# Patient Record
Sex: Male | Born: 1945 | Race: White | Hispanic: No | Marital: Married | Smoking: Former smoker
Health system: Southern US, Community
[De-identification: ages and names within clinical notes are randomized; demographics above are authoritative.]

## PROBLEM LIST (undated history)

## (undated) DIAGNOSIS — R0600 Dyspnea, unspecified: Secondary | ICD-10-CM

## (undated) DIAGNOSIS — M1990 Unspecified osteoarthritis, unspecified site: Secondary | ICD-10-CM

## (undated) DIAGNOSIS — E785 Hyperlipidemia, unspecified: Secondary | ICD-10-CM

## (undated) DIAGNOSIS — R188 Other ascites: Secondary | ICD-10-CM

## (undated) DIAGNOSIS — D696 Thrombocytopenia, unspecified: Secondary | ICD-10-CM

## (undated) DIAGNOSIS — K219 Gastro-esophageal reflux disease without esophagitis: Secondary | ICD-10-CM

## (undated) DIAGNOSIS — E119 Type 2 diabetes mellitus without complications: Secondary | ICD-10-CM

## (undated) DIAGNOSIS — I350 Nonrheumatic aortic (valve) stenosis: Secondary | ICD-10-CM

## (undated) DIAGNOSIS — I5032 Chronic diastolic (congestive) heart failure: Secondary | ICD-10-CM

## (undated) DIAGNOSIS — Z8719 Personal history of other diseases of the digestive system: Secondary | ICD-10-CM

## (undated) DIAGNOSIS — I10 Essential (primary) hypertension: Secondary | ICD-10-CM

## (undated) DIAGNOSIS — F329 Major depressive disorder, single episode, unspecified: Secondary | ICD-10-CM

## (undated) DIAGNOSIS — R0989 Other specified symptoms and signs involving the circulatory and respiratory systems: Secondary | ICD-10-CM

## (undated) DIAGNOSIS — M109 Gout, unspecified: Secondary | ICD-10-CM

## (undated) DIAGNOSIS — R590 Localized enlarged lymph nodes: Secondary | ICD-10-CM

## (undated) DIAGNOSIS — I2510 Atherosclerotic heart disease of native coronary artery without angina pectoris: Secondary | ICD-10-CM

## (undated) DIAGNOSIS — Z950 Presence of cardiac pacemaker: Secondary | ICD-10-CM

## (undated) DIAGNOSIS — Q2730 Arteriovenous malformation, site unspecified: Secondary | ICD-10-CM

## (undated) DIAGNOSIS — K7460 Unspecified cirrhosis of liver: Secondary | ICD-10-CM

## (undated) DIAGNOSIS — K5520 Angiodysplasia of colon without hemorrhage: Secondary | ICD-10-CM

## (undated) DIAGNOSIS — I509 Heart failure, unspecified: Secondary | ICD-10-CM

## (undated) DIAGNOSIS — D509 Iron deficiency anemia, unspecified: Secondary | ICD-10-CM

## (undated) DIAGNOSIS — G4700 Insomnia, unspecified: Secondary | ICD-10-CM

## (undated) DIAGNOSIS — R519 Headache, unspecified: Secondary | ICD-10-CM

## (undated) DIAGNOSIS — E663 Overweight: Secondary | ICD-10-CM

## (undated) DIAGNOSIS — F32A Depression, unspecified: Secondary | ICD-10-CM

## (undated) DIAGNOSIS — I4891 Unspecified atrial fibrillation: Secondary | ICD-10-CM

## (undated) DIAGNOSIS — N189 Chronic kidney disease, unspecified: Secondary | ICD-10-CM

## (undated) DIAGNOSIS — R51 Headache: Secondary | ICD-10-CM

## (undated) DIAGNOSIS — G4733 Obstructive sleep apnea (adult) (pediatric): Secondary | ICD-10-CM

## (undated) DIAGNOSIS — J449 Chronic obstructive pulmonary disease, unspecified: Secondary | ICD-10-CM

## (undated) HISTORY — PX: RIGHT HEART CATH: CATH118263

## (undated) HISTORY — DX: Thrombocytopenia, unspecified: D69.6

## (undated) HISTORY — DX: Unspecified osteoarthritis, unspecified site: M19.90

## (undated) HISTORY — DX: Iron deficiency anemia, unspecified: D50.9

## (undated) HISTORY — DX: Unspecified cirrhosis of liver: K74.60

## (undated) HISTORY — DX: Arteriovenous malformation, site unspecified: Q27.30

## (undated) HISTORY — PX: CATARACT EXTRACTION W/ INTRAOCULAR LENS IMPLANT: SHX1309

## (undated) HISTORY — DX: Obstructive sleep apnea (adult) (pediatric): G47.33

## (undated) HISTORY — DX: Localized enlarged lymph nodes: R59.0

## (undated) HISTORY — DX: Unspecified atrial fibrillation: I48.91

## (undated) HISTORY — DX: Chronic diastolic (congestive) heart failure: I50.32

## (undated) HISTORY — DX: Overweight: E66.3

## (undated) HISTORY — DX: Type 2 diabetes mellitus without complications: E11.9

## (undated) HISTORY — DX: Other specified symptoms and signs involving the circulatory and respiratory systems: R09.89

## (undated) HISTORY — DX: Hyperlipidemia, unspecified: E78.5

## (undated) HISTORY — PX: HERNIA REPAIR: SHX51

## (undated) HISTORY — DX: Insomnia, unspecified: G47.00

## (undated) HISTORY — DX: Atherosclerotic heart disease of native coronary artery without angina pectoris: I25.10

---

## 1997-07-25 DIAGNOSIS — G4733 Obstructive sleep apnea (adult) (pediatric): Secondary | ICD-10-CM

## 1997-07-25 HISTORY — DX: Obstructive sleep apnea (adult) (pediatric): G47.33

## 2000-05-21 ENCOUNTER — Emergency Department (HOSPITAL_COMMUNITY): Admission: EM | Admit: 2000-05-21 | Discharge: 2000-05-21 | Payer: Self-pay | Admitting: Emergency Medicine

## 2001-08-17 ENCOUNTER — Emergency Department (HOSPITAL_COMMUNITY): Admission: EM | Admit: 2001-08-17 | Discharge: 2001-08-17 | Payer: Self-pay | Admitting: Emergency Medicine

## 2001-08-18 ENCOUNTER — Encounter: Payer: Self-pay | Admitting: Emergency Medicine

## 2001-08-28 ENCOUNTER — Ambulatory Visit (HOSPITAL_COMMUNITY): Admission: RE | Admit: 2001-08-28 | Discharge: 2001-08-28 | Payer: Self-pay | Admitting: Family Medicine

## 2002-05-14 ENCOUNTER — Emergency Department (HOSPITAL_COMMUNITY): Admission: EM | Admit: 2002-05-14 | Discharge: 2002-05-15 | Payer: Self-pay | Admitting: Emergency Medicine

## 2002-05-15 ENCOUNTER — Encounter: Payer: Self-pay | Admitting: Emergency Medicine

## 2002-06-16 ENCOUNTER — Inpatient Hospital Stay (HOSPITAL_COMMUNITY): Admission: EM | Admit: 2002-06-16 | Discharge: 2002-06-21 | Payer: Self-pay

## 2002-06-18 ENCOUNTER — Encounter: Payer: Self-pay | Admitting: Cardiology

## 2002-06-20 ENCOUNTER — Encounter: Payer: Self-pay | Admitting: Gastroenterology

## 2002-06-24 ENCOUNTER — Ambulatory Visit (HOSPITAL_COMMUNITY): Admission: RE | Admit: 2002-06-24 | Discharge: 2002-06-24 | Payer: Self-pay | Admitting: Cardiology

## 2002-09-11 ENCOUNTER — Ambulatory Visit (HOSPITAL_COMMUNITY): Admission: RE | Admit: 2002-09-11 | Discharge: 2002-09-11 | Payer: Self-pay | Admitting: Gastroenterology

## 2002-09-23 ENCOUNTER — Ambulatory Visit: Admission: RE | Admit: 2002-09-23 | Discharge: 2002-09-23 | Payer: Self-pay | Admitting: Internal Medicine

## 2002-10-14 ENCOUNTER — Encounter: Payer: Self-pay | Admitting: Cardiology

## 2002-10-15 ENCOUNTER — Encounter: Payer: Self-pay | Admitting: Thoracic Surgery (Cardiothoracic Vascular Surgery)

## 2002-10-15 ENCOUNTER — Inpatient Hospital Stay (HOSPITAL_COMMUNITY): Admission: AD | Admit: 2002-10-15 | Discharge: 2002-10-23 | Payer: Self-pay | Admitting: Cardiology

## 2002-10-15 DIAGNOSIS — Z951 Presence of aortocoronary bypass graft: Secondary | ICD-10-CM

## 2002-10-15 HISTORY — PX: CORONARY ARTERY BYPASS GRAFT: SHX141

## 2002-10-16 ENCOUNTER — Encounter: Payer: Self-pay | Admitting: Thoracic Surgery (Cardiothoracic Vascular Surgery)

## 2002-10-17 ENCOUNTER — Encounter: Payer: Self-pay | Admitting: Thoracic Surgery (Cardiothoracic Vascular Surgery)

## 2002-11-26 ENCOUNTER — Encounter: Payer: Self-pay | Admitting: Thoracic Surgery (Cardiothoracic Vascular Surgery)

## 2002-11-26 ENCOUNTER — Encounter
Admission: RE | Admit: 2002-11-26 | Discharge: 2002-11-26 | Payer: Self-pay | Admitting: Thoracic Surgery (Cardiothoracic Vascular Surgery)

## 2002-12-19 ENCOUNTER — Encounter: Payer: Self-pay | Admitting: Thoracic Surgery (Cardiothoracic Vascular Surgery)

## 2002-12-19 ENCOUNTER — Encounter
Admission: RE | Admit: 2002-12-19 | Discharge: 2002-12-19 | Payer: Self-pay | Admitting: Thoracic Surgery (Cardiothoracic Vascular Surgery)

## 2003-01-02 ENCOUNTER — Encounter: Payer: Self-pay | Admitting: Thoracic Surgery (Cardiothoracic Vascular Surgery)

## 2003-01-06 ENCOUNTER — Ambulatory Visit (HOSPITAL_COMMUNITY)
Admission: RE | Admit: 2003-01-06 | Discharge: 2003-01-06 | Payer: Self-pay | Admitting: Thoracic Surgery (Cardiothoracic Vascular Surgery)

## 2003-01-06 ENCOUNTER — Encounter (INDEPENDENT_AMBULATORY_CARE_PROVIDER_SITE_OTHER): Payer: Self-pay | Admitting: *Deleted

## 2003-10-08 ENCOUNTER — Ambulatory Visit (HOSPITAL_BASED_OUTPATIENT_CLINIC_OR_DEPARTMENT_OTHER): Admission: RE | Admit: 2003-10-08 | Discharge: 2003-10-08 | Payer: Self-pay | Admitting: Orthopedic Surgery

## 2003-10-08 HISTORY — PX: CARPAL TUNNEL RELEASE: SHX101

## 2003-12-19 ENCOUNTER — Encounter: Admission: RE | Admit: 2003-12-19 | Discharge: 2003-12-19 | Payer: Self-pay | Admitting: Family Medicine

## 2004-05-12 ENCOUNTER — Encounter: Admission: RE | Admit: 2004-05-12 | Discharge: 2004-05-12 | Payer: Self-pay | Admitting: Family Medicine

## 2004-06-07 ENCOUNTER — Ambulatory Visit: Payer: Self-pay | Admitting: Cardiology

## 2004-06-25 ENCOUNTER — Ambulatory Visit: Payer: Self-pay | Admitting: Cardiology

## 2004-07-05 ENCOUNTER — Ambulatory Visit: Payer: Self-pay | Admitting: Cardiology

## 2004-08-02 ENCOUNTER — Ambulatory Visit: Payer: Self-pay | Admitting: Cardiology

## 2004-08-31 ENCOUNTER — Ambulatory Visit: Payer: Self-pay | Admitting: Cardiology

## 2004-09-20 ENCOUNTER — Ambulatory Visit: Payer: Self-pay | Admitting: Internal Medicine

## 2004-10-11 ENCOUNTER — Ambulatory Visit: Payer: Self-pay | Admitting: *Deleted

## 2004-11-08 ENCOUNTER — Ambulatory Visit: Payer: Self-pay | Admitting: *Deleted

## 2004-12-06 ENCOUNTER — Ambulatory Visit: Payer: Self-pay | Admitting: Cardiology

## 2005-01-06 ENCOUNTER — Ambulatory Visit: Payer: Self-pay | Admitting: Internal Medicine

## 2005-01-17 ENCOUNTER — Inpatient Hospital Stay (HOSPITAL_COMMUNITY): Admission: EM | Admit: 2005-01-17 | Discharge: 2005-01-20 | Payer: Self-pay | Admitting: Emergency Medicine

## 2005-01-18 ENCOUNTER — Encounter: Payer: Self-pay | Admitting: Cardiology

## 2005-01-18 ENCOUNTER — Ambulatory Visit: Payer: Self-pay | Admitting: Cardiology

## 2005-01-19 ENCOUNTER — Ambulatory Visit: Payer: Self-pay | Admitting: Cardiology

## 2005-01-19 HISTORY — PX: CORONARY ANGIOPLASTY WITH STENT PLACEMENT: SHX49

## 2005-01-28 ENCOUNTER — Ambulatory Visit: Payer: Self-pay | Admitting: Cardiology

## 2005-03-25 ENCOUNTER — Ambulatory Visit: Payer: Self-pay | Admitting: Cardiology

## 2005-04-07 ENCOUNTER — Ambulatory Visit: Payer: Self-pay | Admitting: Cardiology

## 2005-04-29 ENCOUNTER — Ambulatory Visit: Payer: Self-pay | Admitting: Cardiology

## 2005-05-10 ENCOUNTER — Ambulatory Visit: Payer: Self-pay | Admitting: Cardiology

## 2005-05-13 ENCOUNTER — Ambulatory Visit: Payer: Self-pay | Admitting: Cardiovascular Disease

## 2005-05-19 ENCOUNTER — Ambulatory Visit: Payer: Self-pay

## 2005-05-27 ENCOUNTER — Ambulatory Visit: Payer: Self-pay

## 2005-05-27 ENCOUNTER — Ambulatory Visit: Payer: Self-pay | Admitting: Cardiology

## 2005-05-27 ENCOUNTER — Ambulatory Visit: Payer: Self-pay | Admitting: Internal Medicine

## 2005-06-06 ENCOUNTER — Ambulatory Visit: Payer: Self-pay | Admitting: Cardiology

## 2005-06-14 ENCOUNTER — Ambulatory Visit: Payer: Self-pay | Admitting: Cardiology

## 2005-06-20 ENCOUNTER — Ambulatory Visit: Payer: Self-pay | Admitting: Cardiology

## 2005-07-11 ENCOUNTER — Ambulatory Visit: Payer: Self-pay | Admitting: Cardiology

## 2005-07-28 ENCOUNTER — Ambulatory Visit: Payer: Self-pay | Admitting: Internal Medicine

## 2005-08-25 ENCOUNTER — Ambulatory Visit: Payer: Self-pay | Admitting: Cardiology

## 2005-09-22 ENCOUNTER — Ambulatory Visit: Payer: Self-pay | Admitting: Cardiology

## 2005-09-28 ENCOUNTER — Encounter: Admission: RE | Admit: 2005-09-28 | Discharge: 2005-12-27 | Payer: Self-pay | Admitting: Family Medicine

## 2005-10-20 ENCOUNTER — Ambulatory Visit: Payer: Self-pay | Admitting: Cardiology

## 2005-10-25 ENCOUNTER — Ambulatory Visit: Payer: Self-pay | Admitting: Cardiology

## 2005-11-01 ENCOUNTER — Inpatient Hospital Stay (HOSPITAL_BASED_OUTPATIENT_CLINIC_OR_DEPARTMENT_OTHER): Admission: RE | Admit: 2005-11-01 | Discharge: 2005-11-01 | Payer: Self-pay | Admitting: Cardiovascular Disease

## 2005-11-01 ENCOUNTER — Ambulatory Visit: Payer: Self-pay | Admitting: Cardiovascular Disease

## 2005-11-04 ENCOUNTER — Ambulatory Visit: Payer: Self-pay | Admitting: Cardiology

## 2005-11-11 ENCOUNTER — Encounter: Payer: Self-pay | Admitting: Cardiology

## 2005-11-11 ENCOUNTER — Ambulatory Visit: Payer: Self-pay

## 2005-11-11 ENCOUNTER — Ambulatory Visit: Payer: Self-pay | Admitting: Cardiology

## 2005-11-16 ENCOUNTER — Ambulatory Visit: Payer: Self-pay | Admitting: Cardiology

## 2005-12-01 ENCOUNTER — Ambulatory Visit: Payer: Self-pay | Admitting: Cardiology

## 2005-12-15 ENCOUNTER — Ambulatory Visit: Payer: Self-pay | Admitting: Cardiology

## 2006-01-05 ENCOUNTER — Ambulatory Visit: Payer: Self-pay | Admitting: Cardiology

## 2006-02-02 ENCOUNTER — Ambulatory Visit: Payer: Self-pay | Admitting: Cardiology

## 2006-02-08 ENCOUNTER — Encounter: Admission: RE | Admit: 2006-02-08 | Discharge: 2006-02-08 | Payer: Self-pay | Admitting: Family Medicine

## 2006-02-13 ENCOUNTER — Encounter: Admission: RE | Admit: 2006-02-13 | Discharge: 2006-02-13 | Payer: Self-pay | Admitting: Family Medicine

## 2006-02-16 ENCOUNTER — Encounter: Admission: RE | Admit: 2006-02-16 | Discharge: 2006-02-16 | Payer: Self-pay | Admitting: Family Medicine

## 2006-03-02 ENCOUNTER — Ambulatory Visit: Payer: Self-pay | Admitting: Cardiology

## 2006-03-17 ENCOUNTER — Ambulatory Visit: Payer: Self-pay | Admitting: Cardiology

## 2006-03-30 ENCOUNTER — Ambulatory Visit: Payer: Self-pay | Admitting: Cardiology

## 2006-04-27 ENCOUNTER — Ambulatory Visit: Payer: Self-pay | Admitting: Cardiology

## 2006-05-25 ENCOUNTER — Ambulatory Visit: Payer: Self-pay | Admitting: *Deleted

## 2006-06-22 ENCOUNTER — Ambulatory Visit: Payer: Self-pay | Admitting: Internal Medicine

## 2006-07-20 ENCOUNTER — Ambulatory Visit: Payer: Self-pay | Admitting: Cardiology

## 2006-08-18 ENCOUNTER — Ambulatory Visit: Payer: Self-pay | Admitting: Internal Medicine

## 2006-09-14 ENCOUNTER — Ambulatory Visit: Payer: Self-pay | Admitting: Cardiology

## 2006-09-18 ENCOUNTER — Ambulatory Visit: Payer: Self-pay | Admitting: Cardiology

## 2006-10-02 ENCOUNTER — Ambulatory Visit: Payer: Self-pay | Admitting: Cardiology

## 2006-10-02 ENCOUNTER — Encounter: Payer: Self-pay | Admitting: Cardiology

## 2006-10-02 LAB — CONVERTED CEMR LAB
BUN: 14 mg/dL (ref 6–23)
CO2: 30 meq/L (ref 19–32)
Calcium: 9 mg/dL (ref 8.4–10.5)
Chloride: 105 meq/L (ref 96–112)
Creatinine, Ser: 1.1 mg/dL (ref 0.4–1.5)
GFR calc Af Amer: 88 mL/min
GFR calc non Af Amer: 73 mL/min
Glucose, Bld: 106 mg/dL — ABNORMAL HIGH (ref 70–99)
Potassium: 4.4 meq/L (ref 3.5–5.1)
Sodium: 143 meq/L (ref 135–145)

## 2006-10-23 ENCOUNTER — Ambulatory Visit: Payer: Self-pay | Admitting: Cardiovascular Disease

## 2006-11-06 ENCOUNTER — Ambulatory Visit: Payer: Self-pay | Admitting: Cardiology

## 2006-12-08 ENCOUNTER — Ambulatory Visit: Payer: Self-pay | Admitting: Cardiology

## 2006-12-14 ENCOUNTER — Ambulatory Visit (HOSPITAL_COMMUNITY): Admission: RE | Admit: 2006-12-14 | Discharge: 2006-12-14 | Payer: Self-pay | Admitting: Chiropractic Medicine

## 2007-01-05 ENCOUNTER — Ambulatory Visit: Payer: Self-pay | Admitting: Cardiovascular Disease

## 2007-02-02 ENCOUNTER — Ambulatory Visit: Payer: Self-pay | Admitting: Cardiology

## 2007-02-28 ENCOUNTER — Ambulatory Visit: Payer: Self-pay | Admitting: Cardiology

## 2007-03-18 ENCOUNTER — Emergency Department (HOSPITAL_COMMUNITY): Admission: EM | Admit: 2007-03-18 | Discharge: 2007-03-18 | Payer: Self-pay | Admitting: Emergency Medicine

## 2007-03-28 ENCOUNTER — Ambulatory Visit: Payer: Self-pay | Admitting: Internal Medicine

## 2007-04-10 ENCOUNTER — Ambulatory Visit: Payer: Self-pay | Admitting: Cardiology

## 2007-04-25 ENCOUNTER — Ambulatory Visit: Payer: Self-pay | Admitting: Cardiology

## 2007-05-23 ENCOUNTER — Ambulatory Visit: Payer: Self-pay | Admitting: Cardiology

## 2007-05-23 ENCOUNTER — Encounter: Payer: Self-pay | Admitting: Cardiology

## 2007-05-23 ENCOUNTER — Encounter: Admission: RE | Admit: 2007-05-23 | Discharge: 2007-05-23 | Payer: Self-pay | Admitting: Family Medicine

## 2007-06-06 ENCOUNTER — Ambulatory Visit: Payer: Self-pay | Admitting: Cardiology

## 2007-06-20 ENCOUNTER — Ambulatory Visit: Payer: Self-pay | Admitting: Cardiology

## 2007-07-18 ENCOUNTER — Ambulatory Visit: Payer: Self-pay | Admitting: Internal Medicine

## 2007-08-13 ENCOUNTER — Ambulatory Visit: Payer: Self-pay | Admitting: Cardiology

## 2007-09-10 ENCOUNTER — Ambulatory Visit: Payer: Self-pay | Admitting: Cardiovascular Disease

## 2007-10-08 ENCOUNTER — Ambulatory Visit: Payer: Self-pay | Admitting: Internal Medicine

## 2007-11-05 ENCOUNTER — Ambulatory Visit: Payer: Self-pay | Admitting: Internal Medicine

## 2007-12-03 ENCOUNTER — Ambulatory Visit: Payer: Self-pay | Admitting: Cardiology

## 2007-12-24 ENCOUNTER — Ambulatory Visit: Payer: Self-pay | Admitting: Cardiology

## 2007-12-31 ENCOUNTER — Ambulatory Visit: Payer: Self-pay | Admitting: Cardiology

## 2008-01-28 ENCOUNTER — Ambulatory Visit: Payer: Self-pay | Admitting: Cardiology

## 2008-02-25 ENCOUNTER — Ambulatory Visit: Payer: Self-pay | Admitting: Internal Medicine

## 2008-03-24 ENCOUNTER — Ambulatory Visit: Payer: Self-pay | Admitting: Internal Medicine

## 2008-04-21 ENCOUNTER — Ambulatory Visit: Payer: Self-pay | Admitting: Internal Medicine

## 2008-05-19 ENCOUNTER — Ambulatory Visit: Payer: Self-pay | Admitting: Cardiology

## 2008-06-20 ENCOUNTER — Ambulatory Visit: Payer: Self-pay | Admitting: Cardiology

## 2008-07-21 ENCOUNTER — Ambulatory Visit: Payer: Self-pay | Admitting: Cardiovascular Disease

## 2008-08-18 ENCOUNTER — Ambulatory Visit: Payer: Self-pay | Admitting: Internal Medicine

## 2008-10-06 ENCOUNTER — Ambulatory Visit: Payer: Self-pay | Admitting: Cardiology

## 2008-12-23 ENCOUNTER — Encounter: Payer: Self-pay | Admitting: *Deleted

## 2009-01-28 ENCOUNTER — Encounter: Payer: Self-pay | Admitting: *Deleted

## 2009-05-02 DIAGNOSIS — E663 Overweight: Secondary | ICD-10-CM

## 2009-05-02 DIAGNOSIS — I25810 Atherosclerosis of coronary artery bypass graft(s) without angina pectoris: Secondary | ICD-10-CM

## 2009-05-18 ENCOUNTER — Ambulatory Visit: Payer: Self-pay | Admitting: Cardiology

## 2009-11-02 ENCOUNTER — Ambulatory Visit: Payer: Self-pay | Admitting: Cardiology

## 2010-05-17 ENCOUNTER — Ambulatory Visit: Payer: Self-pay | Admitting: Cardiology

## 2010-05-17 DIAGNOSIS — D649 Anemia, unspecified: Secondary | ICD-10-CM | POA: Insufficient documentation

## 2010-08-06 ENCOUNTER — Inpatient Hospital Stay (HOSPITAL_COMMUNITY)
Admission: EM | Admit: 2010-08-06 | Discharge: 2010-08-11 | Payer: Self-pay | Source: Home / Self Care | Attending: Internal Medicine | Admitting: Internal Medicine

## 2010-08-09 ENCOUNTER — Encounter: Payer: Self-pay | Admitting: Internal Medicine

## 2010-08-09 ENCOUNTER — Encounter (INDEPENDENT_AMBULATORY_CARE_PROVIDER_SITE_OTHER): Payer: Self-pay | Admitting: Internal Medicine

## 2010-08-09 LAB — CK TOTAL AND CKMB (NOT AT ARMC)
CK, MB: 1.1 ng/mL (ref 0.3–4.0)
CK, MB: 1.1 ng/mL (ref 0.3–4.0)
CK, MB: 1.2 ng/mL (ref 0.3–4.0)
Relative Index: INVALID (ref 0.0–2.5)
Relative Index: INVALID (ref 0.0–2.5)
Relative Index: INVALID (ref 0.0–2.5)
Total CK: 58 U/L (ref 7–232)
Total CK: 60 U/L (ref 7–232)
Total CK: 63 U/L (ref 7–232)

## 2010-08-09 LAB — CBC
HCT: 25.4 % — ABNORMAL LOW (ref 39.0–52.0)
HCT: 30.1 % — ABNORMAL LOW (ref 39.0–52.0)
HCT: 27.3 % — ABNORMAL LOW (ref 39.0–52.0)
HCT: 29.6 % — ABNORMAL LOW (ref 39.0–52.0)
HCT: 30.4 % — ABNORMAL LOW (ref 39.0–52.0)
Hemoglobin: 7.6 g/dL — ABNORMAL LOW (ref 13.0–17.0)
Hemoglobin: 9 g/dL — ABNORMAL LOW (ref 13.0–17.0)
Hemoglobin: 8.3 g/dL — ABNORMAL LOW (ref 13.0–17.0)
Hemoglobin: 8.8 g/dL — ABNORMAL LOW (ref 13.0–17.0)
Hemoglobin: 9.1 g/dL — ABNORMAL LOW (ref 13.0–17.0)
MCH: 22.8 pg — ABNORMAL LOW (ref 26.0–34.0)
MCH: 23.3 pg — ABNORMAL LOW (ref 26.0–34.0)
MCH: 23.6 pg — ABNORMAL LOW (ref 26.0–34.0)
MCH: 23.2 pg — ABNORMAL LOW (ref 26.0–34.0)
MCH: 23.3 pg — ABNORMAL LOW (ref 26.0–34.0)
MCHC: 29.9 g/dL — ABNORMAL LOW (ref 30.0–36.0)
MCHC: 29.9 g/dL — ABNORMAL LOW (ref 30.0–36.0)
MCHC: 30.4 g/dL (ref 30.0–36.0)
MCHC: 29.7 g/dL — ABNORMAL LOW (ref 30.0–36.0)
MCHC: 29.9 g/dL — ABNORMAL LOW (ref 30.0–36.0)
MCV: 76 fL — ABNORMAL LOW (ref 78.0–100.0)
MCV: 78 fL (ref 78.0–100.0)
MCV: 77.8 fL — ABNORMAL LOW (ref 78.0–100.0)
MCV: 78.1 fL (ref 78.0–100.0)
MCV: 77.9 fL — ABNORMAL LOW (ref 78.0–100.0)
Platelets: 121 10*3/uL — ABNORMAL LOW (ref 150–400)
Platelets: 101 10*3/uL — ABNORMAL LOW (ref 150–400)
Platelets: 106 10*3/uL — ABNORMAL LOW (ref 150–400)
Platelets: 92 10*3/uL — ABNORMAL LOW (ref 150–400)
Platelets: 96 10*3/uL — ABNORMAL LOW (ref 150–400)
RBC: 3.34 MIL/uL — ABNORMAL LOW (ref 4.22–5.81)
RBC: 3.86 MIL/uL — ABNORMAL LOW (ref 4.22–5.81)
RBC: 3.51 MIL/uL — ABNORMAL LOW (ref 4.22–5.81)
RBC: 3.79 MIL/uL — ABNORMAL LOW (ref 4.22–5.81)
RBC: 3.9 MIL/uL — ABNORMAL LOW (ref 4.22–5.81)
RDW: 17.6 % — ABNORMAL HIGH (ref 11.5–15.5)
RDW: 17.2 % — ABNORMAL HIGH (ref 11.5–15.5)
RDW: 17.4 % — ABNORMAL HIGH (ref 11.5–15.5)
RDW: 17.7 % — ABNORMAL HIGH (ref 11.5–15.5)
RDW: 18 % — ABNORMAL HIGH (ref 11.5–15.5)
WBC: 6.4 10*3/uL (ref 4.0–10.5)
WBC: 7.2 10*3/uL (ref 4.0–10.5)
WBC: 5.3 10*3/uL (ref 4.0–10.5)
WBC: 5.6 10*3/uL (ref 4.0–10.5)
WBC: 5.9 10*3/uL (ref 4.0–10.5)

## 2010-08-09 LAB — APTT: aPTT: 31 seconds (ref 24–37)

## 2010-08-09 LAB — GLUCOSE, CAPILLARY
Glucose-Capillary: 128 mg/dL — ABNORMAL HIGH (ref 70–99)
Glucose-Capillary: 129 mg/dL — ABNORMAL HIGH (ref 70–99)
Glucose-Capillary: 148 mg/dL — ABNORMAL HIGH (ref 70–99)
Glucose-Capillary: 150 mg/dL — ABNORMAL HIGH (ref 70–99)
Glucose-Capillary: 195 mg/dL — ABNORMAL HIGH (ref 70–99)
Glucose-Capillary: 97 mg/dL (ref 70–99)
Glucose-Capillary: 135 mg/dL — ABNORMAL HIGH (ref 70–99)
Glucose-Capillary: 137 mg/dL — ABNORMAL HIGH (ref 70–99)
Glucose-Capillary: 145 mg/dL — ABNORMAL HIGH (ref 70–99)
Glucose-Capillary: 157 mg/dL — ABNORMAL HIGH (ref 70–99)
Glucose-Capillary: 161 mg/dL — ABNORMAL HIGH (ref 70–99)
Glucose-Capillary: 206 mg/dL — ABNORMAL HIGH (ref 70–99)

## 2010-08-09 LAB — POCT I-STAT, CHEM 8
BUN: 21 mg/dL (ref 6–23)
Calcium, Ion: 1.07 mmol/L — ABNORMAL LOW (ref 1.12–1.32)
Chloride: 103 mEq/L (ref 96–112)
Creatinine, Ser: 1.2 mg/dL (ref 0.4–1.5)
Glucose, Bld: 116 mg/dL — ABNORMAL HIGH (ref 70–99)
HCT: 26 % — ABNORMAL LOW (ref 39.0–52.0)
Hemoglobin: 8.8 g/dL — ABNORMAL LOW (ref 13.0–17.0)
Potassium: 4 mEq/L (ref 3.5–5.1)
Sodium: 139 mEq/L (ref 135–145)
TCO2: 30 mmol/L (ref 0–100)

## 2010-08-09 LAB — DIFFERENTIAL
Basophils Absolute: 0.1 10*3/uL (ref 0.0–0.1)
Basophils Relative: 1 % (ref 0–1)
Eosinophils Absolute: 0.1 10*3/uL (ref 0.0–0.7)
Eosinophils Relative: 1 % (ref 0–5)
Lymphocytes Relative: 19 % (ref 12–46)
Lymphs Abs: 1.2 10*3/uL (ref 0.7–4.0)
Monocytes Absolute: 0.6 10*3/uL (ref 0.1–1.0)
Monocytes Relative: 10 % (ref 3–12)
Neutro Abs: 4.4 10*3/uL (ref 1.7–7.7)
Neutrophils Relative %: 69 % (ref 43–77)

## 2010-08-09 LAB — BASIC METABOLIC PANEL
BUN: 5 mg/dL — ABNORMAL LOW (ref 6–23)
CO2: 24 mEq/L (ref 19–32)
Calcium: 8.6 mg/dL (ref 8.4–10.5)
Chloride: 108 mEq/L (ref 96–112)
Creatinine, Ser: 0.95 mg/dL (ref 0.4–1.5)
GFR calc Af Amer: >60 mL/min (ref >60)
GFR calc non Af Amer: >60 mL/min (ref >60)
Glucose, Bld: 134 mg/dL — ABNORMAL HIGH (ref 70–99)
Potassium: 3.6 mEq/L (ref 3.5–5.1)
Sodium: 140 mEq/L (ref 135–145)

## 2010-08-09 LAB — PROTIME-INR
INR: 1.98 — ABNORMAL HIGH (ref 0.00–1.49)
INR: 1.68 — ABNORMAL HIGH (ref 0.00–1.49)
INR: 1.4 (ref 0.00–1.49)
Prothrombin Time: 22.7 seconds — ABNORMAL HIGH (ref 11.6–15.2)
Prothrombin Time: 20 seconds — ABNORMAL HIGH (ref 11.6–15.2)
Prothrombin Time: 17.4 seconds — ABNORMAL HIGH (ref 11.6–15.2)

## 2010-08-09 LAB — TROPONIN I
Troponin I: 0.01 ng/mL (ref 0.00–0.06)
Troponin I: 0.01 ng/mL (ref 0.00–0.06)
Troponin I: 0.01 ng/mL (ref 0.00–0.06)

## 2010-08-09 LAB — IRON: Iron: 145 ug/dL — ABNORMAL HIGH (ref 42–135)

## 2010-08-09 LAB — MRSA PCR SCREENING: MRSA by PCR: NEGATIVE

## 2010-08-09 LAB — FERRITIN: Ferritin: 12 ng/mL — ABNORMAL LOW (ref 22–322)

## 2010-08-09 LAB — OCCULT BLOOD, POC DEVICE: Fecal Occult Bld: POSITIVE

## 2010-08-09 LAB — ABO/RH: ABO/RH(D): A POS

## 2010-08-09 LAB — HEMOGLOBIN A1C
Hgb A1c MFr Bld: 7.7 % — ABNORMAL HIGH (ref <5.7)
Mean Plasma Glucose: 174 mg/dL — ABNORMAL HIGH (ref <117)

## 2010-08-09 LAB — POCT CARDIAC MARKERS
CKMB, poc: <1 ng/mL — ABNORMAL LOW (ref 1.0–8.0)
Myoglobin, poc: 57.2 ng/mL (ref 12–200)
Troponin i, poc: <0.05 ng/mL (ref 0.00–0.09)

## 2010-08-09 LAB — DIGOXIN LEVEL: Digoxin Level: 0.2 ng/mL — ABNORMAL LOW (ref 0.8–2.0)

## 2010-08-11 LAB — CBC
HCT: 28.4 % — ABNORMAL LOW (ref 39.0–52.0)
HCT: 32.7 % — ABNORMAL LOW (ref 39.0–52.0)
Hemoglobin: 8.6 g/dL — ABNORMAL LOW (ref 13.0–17.0)
Hemoglobin: 10.3 g/dL — ABNORMAL LOW (ref 13.0–17.0)
MCH: 23.7 pg — ABNORMAL LOW (ref 26.0–34.0)
MCH: 24.3 pg — ABNORMAL LOW (ref 26.0–34.0)
MCHC: 30.3 g/dL (ref 30.0–36.0)
MCHC: 31.5 g/dL (ref 30.0–36.0)
MCV: 78.2 fL (ref 78.0–100.0)
MCV: 77.3 fL — ABNORMAL LOW (ref 78.0–100.0)
Platelets: 96 10*3/uL — ABNORMAL LOW (ref 150–400)
Platelets: 94 10*3/uL — ABNORMAL LOW (ref 150–400)
RBC: 3.63 MIL/uL — ABNORMAL LOW (ref 4.22–5.81)
RBC: 4.23 MIL/uL (ref 4.22–5.81)
RDW: 18.4 % — ABNORMAL HIGH (ref 11.5–15.5)
RDW: 18.5 % — ABNORMAL HIGH (ref 11.5–15.5)
WBC: 5.2 10*3/uL (ref 4.0–10.5)
WBC: 6.2 10*3/uL (ref 4.0–10.5)

## 2010-08-11 LAB — TYPE AND SCREEN
ABO/RH(D): A POS
Antibody Screen: NEGATIVE
Unit division: 0
Unit division: 0
Unit division: 0
Unit division: 0

## 2010-08-11 LAB — GLUCOSE, CAPILLARY
Glucose-Capillary: 126 mg/dL — ABNORMAL HIGH (ref 70–99)
Glucose-Capillary: 128 mg/dL — ABNORMAL HIGH (ref 70–99)
Glucose-Capillary: 136 mg/dL — ABNORMAL HIGH (ref 70–99)
Glucose-Capillary: 136 mg/dL — ABNORMAL HIGH (ref 70–99)
Glucose-Capillary: 221 mg/dL — ABNORMAL HIGH (ref 70–99)
Glucose-Capillary: 125 mg/dL — ABNORMAL HIGH (ref 70–99)
Glucose-Capillary: 129 mg/dL — ABNORMAL HIGH (ref 70–99)
Glucose-Capillary: 133 mg/dL — ABNORMAL HIGH (ref 70–99)
Glucose-Capillary: 138 mg/dL — ABNORMAL HIGH (ref 70–99)
Glucose-Capillary: 138 mg/dL — ABNORMAL HIGH (ref 70–99)
Glucose-Capillary: 148 mg/dL — ABNORMAL HIGH (ref 70–99)
Glucose-Capillary: 293 mg/dL — ABNORMAL HIGH (ref 70–99)

## 2010-08-11 LAB — PREPARE RBC (CROSSMATCH)

## 2010-08-12 NOTE — Progress Notes (Signed)
NAMESEDDRICK, FLAX NO.:  [id number]  MEDICAL RECORD NO.:  [id number]          PATIENT TYPE:  INP  LOCATION:  2604                         FACILITY:  [facility]  PHYSICIAN:  Jeoffrey Massed, MD    DATE OF BIRTH:  September 13, 1945                                PROGRESS NOTE   PRIMARY CARE PHYSICIAN: [name], MD at [facility] [place].  CONSULTANTS THIS ADMISSION: [name]. [name], MD for Gastroenterology.  CHIEF COMPLAINT/REASON FOR ADMISSION: Mr. Lindstrom is a 65 year old male patient with known CAD, prior cardiac stent, diabetes as well as chronic atrial fibrillation with a CHADS2 score on therapeutic Coumadin.  He reports that normally his INR is very well controlled on a 5-mg daily dose alternating with a 7.5-mg dose on Wednesdays.  He had routine lab work done at his primary care physician's office that demonstrated anemia, hemoglobin 8.0.  He admitted to having melanotic stool for 2-3 days.  He was not taking any iron supplementation or Pepto-Bismol.  He did take some Alka-Seltzer. He had no other symptoms except for feeling weak.  In the ER, his stool was heme positive and hemoglobin was 7.6.  His INR was nearly therapeutic at 1.98.  His blood pressure was stable.  His EKG showed atrial fibrillation with a heart rate between 95 and 100.  He reports his last colonoscopy was greater than 10 years prior.  PHYSICAL EXAMINATION: VITAL SIGNS:  Upon initial evaluation, his temperature was 98.6, BP 130/80, heart rate 100, respirations 12. HEENT:  He appeared pale with pale conjunctiva, but otherwise in no acute distress. His physical exam was otherwise unremarkable.  ADDITIONAL LABORATORY STUDIES: A white count of 6400, MCV of 76.  Dig level 0.2.  BUN 21, creatinine 1.2.  PAST MEDICAL HISTORY: 1. Known CAD status post prior coronary stent. 2. Hypertension. 3. Diabetes, on oral hypoglycemic agents. 4. Atrial fibrillation, CHADS score 2. 5. Chronic  systemic anticoagulation secondary to atrial fibrillation. 6. Dyslipidemia.  ADMITTING DIAGNOSES: 1. Acute blood loss anemia secondary to a gastrointestinal bleeding,     most likely upper gastrointestinal. 2. Systemic anticoagulation, on Coumadin with therapeutic INR. 3. Atrial fibrillation, currently rate controlled. 4. Diabetes, on oral hypoglycemic agents without current     hyperglycemia. 5. Hypertension. 6. Coronary artery disease with previous bypass and stent, currently     asymptomatic.[name]  DIAGNOSTICS: EKG in the ER shows atrial fibrillation with a ventricular rate of 103. No ischemic changes noted on EKG.  LABORATORY DATA: MRSA PCR screening, January 13 was negative.  Cardiac isoenzymes were cycled x3, this showed no evidence of ischemic changes.  Hemoglobin as previously noted at presentation was 7.6.  After receiving blood products this admission, the patient's hemoglobin remained in the 8 to 9 range and as of today, January 17, stable at 10.3; white count 6200, hematocrit 32.7 and platelets are 94,000.  Last coag panel was checked on January 15 with a PT of 1.4 and INR of 17.4.  Last BMET was checked on January 15; sodium 140, potassium 3.6, chloride 108, CO2 24, glucose 134, BUN 5, creatinine 0.95.  PROCEDURES: 1. Colonoscopy with  snare polypectomy on January 16 shows a normal     terminal ileum, a  diminutive polyp in the distal transverse colon,     which was removed.  Otherwise, normal colonoscopy.  Recommendations     were to repeat a colonoscopy in 5 years with Dr. Arlyce Dice if the     polyp is adenomatous, otherwise in 10 years. 2. EGD with biopsy on January 16, which showed that the esophagus and     GE junction were completely normal.  There was a soft 1-cm     pedunculated polyp with superficial ulceration or friability at the     anterior mid body of the stomach, which was biopsied, then Endo-     clipped. The stomach was otherwise normal.  The duodenal bulb  was     normal as was the post-bulbar duodenum.  There was a hiatal hernia     seen.  There was some oozing after the biopsy, which was easily     controlled with Endo-clipping.  Recommendations were to continue     PPI daily, avoid NSAIDs, okay to resume Coumadin if medically     reasonable as deemed by the patient's cardiologist or primary     provider - would be best to hold for 1 week.  Also, there is a need     to follow up for biopsies.  Recommendation was to transfuse 2 more     units of packed red blood cells on date of procedure and administer     iron supplementation indefinitely.  HOSPITAL COURSE: 1. Acute blood loss anemia secondary to upper GI bleeding.  The     patient has received 4 units of packed red blood cells this     admission.  His hemoglobin drifted down slightly after EGD, this     was attributed to some minor bleeding after polyp biopsy.  The     patient had polyp removed from the colon as well as polyp removed     from the stomach as previously noted in procedures. Pathologies are     still pending.  As of today, the patient's hemoglobin is stable at     10.3.  GI has advanced his diet to solids.  He has not had any     darker red stools and he has been started on iron sulfate as     recommended by GI.  In addition to avoiding Celebrex, in regards to     keeping the patient off aspirin and Coumadin for 1 week, Dr. Sharon Seller     previously discussed the risk of CVA with the patient and his wife     and they verbalized understanding that the risk for continuing     Coumadin over the short term is outweighed by the increased risk of     GI bleeding. 2. Atrial fibrillation on chronic Coumadin.  Currently, he is rate     controlled.  His Coumadin and aspirin are on hold for at least 1     week.  GI recommends through August 19, 2010.  The patient     normally follows with Dr. Lanier Ensign who has been managing his     INR and Coumadin dosing.  He is normally on 5  mg daily; except for     Wednesdays, he takes 7.5.  The patient's cardiologist is Dr.     Andee Lineman.  He has also been restarted on digoxin. 3. Systemic anticoagulation with Coumadin .  Due to acute GI bleeding, the     patient's INR was reversed with vitamin K.  INR is currently     subtherapeutic (see above). 4. Diabetes mellitus.  The patient was on several medications prior to     admission. CBGs are well controlled.  He is on sliding scale     insulin while here.  He was on glimepiride, Januvia, and metformin     at home.  Please resume these as glucose increases. 5. Acute GI bleeding.  As previously noted, the patient had basically     a normal colonoscopy- a small polyp was biopsied. In addition, the EGD     which revealed an ulcerated, pedunculated polyp in the body of the     stomach, was the most likely source of GI bleeding.  He was     instructed to avoid Celebrex and other NSAIDs and was started on a     PPI as well as iron, which he will continue after discharge. 6. Hypertension.  The patient had relative hypotension in the setting     of acute blood loss anemia with decrease in hemoglobin.  His blood     pressure has now normalized after normalization in hemoglobin.  He has      been restarted back on his lisinopril, metoprolol, and verapamil. 7. Mild thrombocytopenia.  The patient presented with a platelet count     of 121,000 and throughout the admission, this was slowly drifting     down into the 94,000 to 96,000 range.  Would recommend following     this up after discharge.  Most likely this is related to acute     blood loss anemia as well as from transfusion of 4 units of packed     red blood cells. 8. CAD with history of prior CABG and stent.  Goal is to keep his     hemoglobin greater than 8 from a cardiac standpoint.  He has not     had any cardiac symptoms such as chest pain or shortness of breath.     His cardiac isoenzymes were cycled and were normal this  admission     and his EKG was normal without ischemic changes. 9. Dyslipidemia.  The patient will continue his usual medications.  DISPOSITION: At the present time, the patient is stable enough to transfer to a telemetry unit.  I have discussed with GI.  They prefer to watch him an additional 24 hours to make sure is hemoglobin is stable and he has had no resumption in his GI bleeding symptoms.  Hopefully, the biopsy results will be back at that time as well.  They recommend holding Coumadin and aspirin at least 1 week and per their recommendations resume on August 19, 2010.     Allison L. Rennis Harding, N.P.   ______________________________ Jeoffrey Massed, MD    ALE/MEDQ  D:  08/10/2010  T:  08/10/2010  Job:  [id number]  cc:   [name], M.D. Fax: [phone]  Electronically Signed by Junious Silk N.P. on 08/10/2010 01:09:35 PM Electronically Signed by Jeoffrey Massed  on 08/12/2010 04:41:09 PM

## 2010-08-16 LAB — CBC
HCT: 32.8 % — ABNORMAL LOW (ref 39.0–52.0)
Hemoglobin: 10 g/dL — ABNORMAL LOW (ref 13.0–17.0)
MCH: 23.8 pg — ABNORMAL LOW (ref 26.0–34.0)
MCHC: 30.5 g/dL (ref 30.0–36.0)
MCV: 77.9 fL — ABNORMAL LOW (ref 78.0–100.0)
Platelets: 84 10*3/uL — ABNORMAL LOW (ref 150–400)
RBC: 4.21 MIL/uL — ABNORMAL LOW (ref 4.22–5.81)
RDW: 18.6 % — ABNORMAL HIGH (ref 11.5–15.5)
WBC: 5 10*3/uL (ref 4.0–10.5)

## 2010-08-16 LAB — GLUCOSE, CAPILLARY
Glucose-Capillary: 139 mg/dL — ABNORMAL HIGH (ref 70–99)
Glucose-Capillary: 199 mg/dL — ABNORMAL HIGH (ref 70–99)

## 2010-08-19 NOTE — Discharge Summary (Addendum)
  [name], [name]               ACCOUNT NO.:  [id number]  MEDICAL RECORD NO.:  [id number]          PATIENT TYPE:  INP  LOCATION:  3735                         FACILITY:  [facility]  PHYSICIAN:  Hind I Elsaid, MD      DATE OF BIRTH:  09-01-1945  DATE OF ADMISSION:  08/06/2010 DATE OF DISCHARGE:  08/11/2010                              DISCHARGE SUMMARY   PRIMARY CARE PHYSICIAN:  [facility] of [place].  DISCHARGE DIAGNOSES:  Same as dictated yesterday by Dr. Dewayne Shorter.  The patient has no further bleeding and hemoglobin remained stable at 10.  The patient denies any chest pain or shortness of breath.  The patient will be discharged with: 1. Ferrous sulfate 150 mg p.o. daily. 2. Lisinopril 2.5 mg p.o. daily. 3. Aspirin enteric coated to be started. 4. Crestor 20 mg p.o. daily. 5. Digoxin 0.125 mg p.o. daily. 6. Ferrous sulfate 325 mg p.o. b.i.d. 7. Fish oil 1200 mg p.o. b.i.d. 8. Amaryl 4 mg p.o. daily. 9. Januvia 100 mg daily. 10.K-Dur 20 mEq p.o. twice daily. 11.Lasix 80 mg daily. 12.Metformin 50 mg p.o. daily. 13.Multivitamin. 14.Omeprazole 40 mg p.o. daily. 15.Verapamil 80 mg 3 times daily. 16.Warfarin 5 mg to be started on August 19, 2010.  The patient needs to check his PT/INR on August 22, 2010.  The patient also advised to follow up with his primary care physician as early as next week.     Hind Bosie Helper, MD     HIE/MEDQ  D:  08/11/2010  T:  08/11/2010  Job:  [id number]  cc:   [facility] [place]. [name], M.D.  Electronically Signed by Ebony Cargo MD on 08/19/2010 12:45:07 PM

## 2010-08-20 NOTE — Consult Note (Signed)
[name]               ACCOUNT NO.:  [id number]  MEDICAL RECORD NO.:  [id number]           PATIENT TYPE:  LOCATION:                                 FACILITY:  PHYSICIAN:  [name]. [name], MD    DATE OF BIRTH:  29-May-1946  DATE OF CONSULTATION:  08/08/2010 DATE OF DISCHARGE:                                CONSULTATION   This is unassigned [facility] patient.  PRIMARY CARE PROVIDER:  [facility] [place].  REASON FOR CONSULTATION:  GI bleed.  HISTORY OF PRESENT ILLNESS:  This is a 65 year old gentleman with a past medical history of coronary artery disease status post bypass graft, stent placement, diabetes, and chronic atrial fibrillation requiring Coumadin who was admitted to the hospital with findings of anemia.  The patient presented to his primary care physician's office for a routine evaluation.  His hemoglobin was noted to be in the 9 range.  Apparently, the patient states that his hemoglobin has never been this low before. As a result, he was recommended to present to the emergency room for further evaluation and treatment.  The patient reports having couple episodes of melanic stools; however, he denies having any abdominal pain.  No NSAID use.  No complaints of any nausea, vomiting, or dysphagia complaints.  The patient also denies having any issues with gastroesophageal reflux disease.  At the time of the admission, the patient did complain of having issues with chest pain but now he feels tremendously better after receiving 2 units of packed red blood cells. As a result, he was also found to be heme-positive in the emergency room.  As a result of the anemia and heme positivity, a GI consultation was requested.  PAST MEDICAL HISTORY AND SURGICAL HISTORY:  As stated above.  FAMILY HISTORY:  Noncontributory.  SOCIAL HISTORY:  Negative for alcohol, tobacco, illicit drug use.  ALLERGIES:  CODEINE and CARDIZEM.  MEDICATIONS: 1. Digoxin  0.125 mg p.o. daily 2. Sliding-scale insulin. 3. Metoprolol 25 mg p.o. b.i.d. 4. Protonix 40 mg IV daily. 5. Crestor 20 mg p.o. at bedtime. 6. Verapamil 80 mg p.o. t.i.d. 7. Morphine 1-2 mg IV q.3 h. p.r.n.  PHYSICAL EXAMINATION:  VITAL SIGNS:  Blood pressure is 107/62, heart rate ranges from 81-112, temperature is 98.6. GENERAL:  The patient is in no acute distress, alert and oriented. HEENT:  Normocephalic, atraumatic.  Extraocular muscles intact. NECK:  Supple.  No lymphadenopathy. LUNGS:  Clear to auscultation bilaterally. CARDIOVASCULAR:  Regular rate and rhythm. ABDOMEN:  Obese, soft, nontender, nondistended.  Positive bowel sounds. EXTREMITIES:  No clubbing, cyanosis, or edema.  LABORATORY VALUES:  White blood cell count is 5.6, hemoglobin 8.8, MCV is 78.1, platelets at 92,000.  Sodium is 140, potassium 2.6, chloride 108, CO2 24, glucose 134, BUN 5, creatinine is 0.9.  IMPRESSION: 1. Heme-positive stool. 2. Anemia. 3. Thrombocytopenia.  The patient did have a colonoscopy 10 years ago for routine purposes and that was negative for any findings.  With the complaints of some intermittent melena and heme positivity, he may have an upper GI bleed. Further evaluation with EGD and colonoscopy is  required at this time. Plan is to perform an EGD and colonoscopy.  Further recommendations will be pending the findings.  If the findings are identified within the EGD, a colonoscopy should still be pursued for routine purposes.  The patient was also found to be thrombocytopenic, I am uncertain about the cause. He had a CT scan in 2008, and they remarked that the liver appeared to be normal at this time.  I am concerned if there is any changes over the intervening 4 years, but further evaluation can be pursued as an outpatient.     [name] [name], MD     PDH/MEDQ  D:  08/08/2010  T:  08/09/2010  Job:  [id number]  Electronically Signed by Jeani Hawking MD on 08/18/2010 07:18:03  AM

## 2010-08-24 NOTE — Assessment & Plan Note (Signed)
Summary: 6 MO FU PER APRIL REMINDER  Medications Added JANUVIA 100 MG TABS (SITAGLIPTIN PHOSPHATE) Take 1 tablet by mouth once a day      Allergies Added:   Visit Type:  Follow-up Primary Provider:  Viviann Spare Myers([place])  CC:  follow-up visit.  History of Present Illness: the patient is a 65 year old male with coronary arteries, diabetes mellitus, proximal atrial fibrillation and chronic diastolic heart failure. The patient is not compliant with his medical therapy. His chronic dyspnea has improved and he reports no chest pain.the patient reports no orthopnea PND. He has not been able to lose some weight. Last week he reports an episode of bronchitis associated with tightness in the upper chest. This also has resolved. He has been switched from Actos to Januvia. He's been under a lot of stress because of his daughter who has been diagnosed with cancer and is scheduled for high-risk surgery because of her weight on the proximal 500 pounds.  He reports a single episode of palpitation, which lasted 15 minutes. He reports no presyncope or syncope.laboratory work from last office was again reviewed his hemoglobin A1c was well controlled. Electrolytes and CBC were within normal limits.   Current Medications (verified): 1)  Multivitamins  Tabs (Multiple Vitamin) .... Take 1 Tablet By Mouth Once A Day 2)  Fish Oil Maximum Strength 1200 Mg Caps (Omega-3 Fatty Acids) .... Take 1 Tablet By Mouth Two Times A Day 3)  Omeprazole 40 Mg Cpdr (Omeprazole) .... Take 1 Tablet By Mouth Two Times A Day 4)  Metformin Hcl 500 Mg Tabs (Metformin Hcl) .... Take 3 Tablet By Mouth Once A Day 5)  Verapamil Hcl 80 Mg Tabs (Verapamil Hcl) .... Take 1 Tablet By Mouth Three Times A Day 6)  Klor-Con M20 20 Meq Cr-Tabs (Potassium Chloride Crys Cr) .... Take 1 Tablet By Mouth Two Times A Day 7)  Warfarin Sodium 5 Mg Tabs (Warfarin Sodium) .... Use As Directed 8)  Celebrex 100 Mg Caps (Celecoxib) .... Take 1 Tablet By  Mouth Once A Day 9)  Crestor 20 Mg Tabs (Rosuvastatin Calcium) .... Take 1 Tablet By Mouth Once A Day 10)  Ferrous Sulfate 324 Mg Tabs (Ferrous Sulfate) .... Take 1&1/2  Tablet By Mouth Two Times A Day 11)  Lisinopril 10 Mg Tabs (Lisinopril) .... Take 1 Tablet By Mouth Once A Day 12)  Metoprolol Tartrate 50 Mg Tabs (Metoprolol Tartrate) .... Take 1&1/2 Tablet By Mouth Two Times A Day 13)  Digoxin 0.125 Mg Tabs (Digoxin) .... Take 1 Tablet By Mouth Once A Day 14)  Glimepiride 4 Mg Tabs (Glimepiride) .... Take 1 Tablet By Mouth Once A Day 15)  Aspirin 81 Mg Tbec (Aspirin) .... Take 1 Tablet By Mouth Once A Day 16)  Lasix 80 Mg Tabs (Furosemide) .... Take 1 Tablet By Mouth Once A Day 17)  Tylenol Extra Strength 500 Mg Tabs (Acetaminophen) .... Take 1 Tablet By Mouth Once A Day 18)  Januvia 100 Mg Tabs (Sitagliptin Phosphate) .... Take 1 Tablet By Mouth Once A Day  Allergies (verified): 1)  ! Cardizem 2)  ! Niacin  Comments:  Nurse/Medical Assistant: The patient's medications and allergies were reviewed with the patient and were updated in the Medication and Allergy Lists. Bottles reviewed.  Past History:  Past Medical History: Last updated: 05/18/2009 OVERWEIGHT/OBESITY (ICD-278.02) CAD, ARTERY BYPASS GRAFT (ICD-414.04) HYPERLIPIDEMIA-MIXED (ICD-272.4) ATRIAL FIBRILLATION (ICD-427.31) DYSPNEA (ICD-786.05) Diabetes mellitus OSA Insomnia  1. Chronic exertional dyspnea secondary to deconditioning. 2. Coronary artery disease, status post  coronary artery bypass     grafting. 3. Permanent atrial fibrillation on warfarin therapy. 4. Diabetes mellitus. 5. Dyslipidemia. 6. Obstructive sleep apnea. 7. Obesity. 8. Deconditioning. 9. Mediastinal adenopathy resolved by last CT scan. 10.Insomnia.   Past Surgical History: Last updated: 05/02/2009 CABG Carpal tunnel surgery. lipoma surgery.  Family History: Last updated: 05/02/2009 Family History of Coronary Artery Disease:    Social History: Last updated: 05/02/2009 Full Time Married  Tobacco Use - Former.   Risk Factors: Smoking Status: quit (05/18/2009)  Review of Systems       The patient complains of palpitations and shortness of breath.  The patient denies fatigue, malaise, fever, weight gain/loss, vision loss, decreased hearing, hoarseness, chest pain, prolonged cough, wheezing, sleep apnea, coughing up blood, abdominal pain, blood in stool, nausea, vomiting, diarrhea, heartburn, incontinence, blood in urine, muscle weakness, joint pain, leg swelling, rash, skin lesions, headache, fainting, dizziness, depression, anxiety, enlarged lymph nodes, easy bruising or bleeding, and environmental allergies.    Vital Signs:  Patient profile:   65 year old male Height:      69 inches Weight:      297 pounds Pulse rate:   71 / minute BP sitting:   120 / 77  (left arm) Cuff size:   large  Vitals Entered By: Carlye Grippe (November 02, 2009 9:13 AM) CC: follow-up visit   Physical Exam  Additional Exam:  General: Well-developed, well-nourished in no distress head: Normocephalic and atraumatic eyes PERRLA/EOMI intact, conjunctiva and lids normal nose: No deformity or lesions mouth normal dentition, normal posterior pharynx neck: Supple, no JVD.  No masses, thyromegaly or abnormal cervical nodes lungs: Normal breath sounds bilaterally without wheezing.  Normal percussion heart: regular rate and rhythm with normal S1 and S2, no S3 or S4.  PMI is normal.  No pathological murmurs abdomen: Normal bowel sounds, abdomen is soft and nontender without masses, organomegaly or hernias noted.  No hepatosplenomegaly musculoskeletal: Back normal, normal gait muscle strength and tone normal pulsus: Pulse is normal in all 4 extremities Extremities: No peripheral pitting edema neurologic: Alert and oriented x 3 skin: Intact without lesions or rashes cervical nodes: No significant adenopathy psychologic: Normal  affect    Impression & Recommendations:  Problem # 1:  CAD, ARTERY BYPASS GRAFT (ICD-414.04) the patient reports no typical exertional substernal chest pain. We will continue with medical therapy and risk factor modification. His updated medication list for this problem includes:    Verapamil Hcl 80 Mg Tabs (Verapamil hcl) .[name]... Take 1 tablet by mouth three times a day    Warfarin Sodium 5 Mg Tabs (Warfarin sodium) ..... Use as directed    Lisinopril 10 Mg Tabs (Lisinopril) .[name]... Take 1 tablet by mouth once a day    Metoprolol Tartrate 50 Mg Tabs (Metoprolol tartrate) .[name]... Take 1&1/2 tablet by mouth two times a day    Aspirin 81 Mg Tbec (Aspirin) .[name]... Take 1 tablet by mouth once a day  Problem # 2:  ATRIAL FIBRILLATION (ICD-427.31) patient is single episode of palpitations. We will continue his current dose of verapamil. His updated medication list for this problem includes:    Warfarin Sodium 5 Mg Tabs (Warfarin sodium) ..... Use as directed    Metoprolol Tartrate 50 Mg Tabs (Metoprolol tartrate) .[name]... Take 1&1/2 tablet by mouth two times a day    Digoxin 0.125 Mg Tabs (Digoxin) .[name]... Take 1 tablet by mouth once a day    Aspirin 81 Mg Tbec (Aspirin) .[name]... Take 1 tablet by  mouth once a day  Problem # 3:  DYSPNEA (ICD-786.05) dyspnea has improved. We felt in the past but this was a large part due to patient's deconditioning. Also his weight has been contributing. Is not able to lose some weight and is feeling better. No evidence of ischemia or need for ischemia workup. His updated medication list for this problem includes:    Verapamil Hcl 80 Mg Tabs (Verapamil hcl) .[name]... Take 1 tablet by mouth three times a day    Lisinopril 10 Mg Tabs (Lisinopril) .[name]... Take 1 tablet by mouth once a day    Metoprolol Tartrate 50 Mg Tabs (Metoprolol tartrate) .[name]... Take 1&1/2 tablet by mouth two times a day    Digoxin 0.125 Mg Tabs (Digoxin) .[name]... Take 1 tablet by mouth once a day    Aspirin 81 Mg  Tbec (Aspirin) .[name]... Take 1 tablet by mouth once a day    Lasix 80 Mg Tabs (Furosemide) .[name]... Take 1 tablet by mouth once a day  Patient Instructions: 1)  Your physician recommends that you continue on your current medications as directed. Please refer to the Current Medication list given to you today. 2)  Follow up in  6 months.

## 2010-08-24 NOTE — Cardiovascular Report (Signed)
Summary: Cardiac Catheterization  Cardiac Catheterization   Imported By: Dorise Hiss 05/14/2010 16:54:55  _____________________________________________________________________  External Attachment:    Type:   Image     Comment:   External Document

## 2010-08-24 NOTE — Assessment & Plan Note (Signed)
Summary: 69month/rm  Medications Added TYLENOL EXTRA STRENGTH 500 MG TABS (ACETAMINOPHEN) Take 1 tablet by mouth once a day as needed      Allergies Added:   Visit Type:  Follow-up Primary Provider:  Viviann Spare Myers([place])   History of Present Illness: the patient is a 65 year old male with a history of permanent atrial fibrillation on warfarin therapy, coronary artery disease status post coronary bypass grafting and chronic exertional dyspnea secondary to deconditioning. The patient has been doing well from a cardiovascular standpoint. He denies any chest pain shortness of breath workup or PND has no palpitations or syncope. He reports that he played 18 holes of golf this morning without any difficulty.  EKG was obtained in the office today and reviewed. Please see report section for full documentation  Preventive Screening-Counseling & Management  Alcohol-Tobacco     Smoking Status: quit     Year Quit: 09/2000  Current Medications (verified): 1)  Multivitamins  Tabs (Multiple Vitamin) .... Take 1 Tablet By Mouth Once A Day 2)  Fish Oil Maximum Strength 1200 Mg Caps (Omega-3 Fatty Acids) .... Take 1 Tablet By Mouth Two Times A Day 3)  Omeprazole 40 Mg Cpdr (Omeprazole) .... Take 1 Tablet By Mouth Two Times A Day 4)  Metformin Hcl 500 Mg Tabs (Metformin Hcl) .... Take 3 Tablet By Mouth Once A Day 5)  Verapamil Hcl 80 Mg Tabs (Verapamil Hcl) .... Take 1 Tablet By Mouth Three Times A Day 6)  Klor-Con M20 20 Meq Cr-Tabs (Potassium Chloride Crys Cr) .... Take 1 Tablet By Mouth Two Times A Day 7)  Warfarin Sodium 5 Mg Tabs (Warfarin Sodium) .... Use As Directed 8)  Celebrex 100 Mg Caps (Celecoxib) .... Take 1 Tablet By Mouth Once A Day 9)  Crestor 20 Mg Tabs (Rosuvastatin Calcium) .... Take 1 Tablet By Mouth Once A Day 10)  Ferrous Sulfate 324 Mg Tabs (Ferrous Sulfate) .... Take 1&1/2  Tablet By Mouth Two Times A Day 11)  Lisinopril 10 Mg Tabs (Lisinopril) .... Take 1 Tablet By Mouth  Once A Day 12)  Metoprolol Tartrate 50 Mg Tabs (Metoprolol Tartrate) .... Take 1&1/2 Tablet By Mouth Two Times A Day 13)  Digoxin 0.125 Mg Tabs (Digoxin) .... Take 1 Tablet By Mouth Once A Day 14)  Glimepiride 4 Mg Tabs (Glimepiride) .... Take 1 Tablet By Mouth Once A Day 15)  Aspirin 81 Mg Tbec (Aspirin) .... Take 1 Tablet By Mouth Once A Day 16)  Lasix 80 Mg Tabs (Furosemide) .... Take 1 Tablet By Mouth Once A Day 17)  Tylenol Extra Strength 500 Mg Tabs (Acetaminophen) .... Take 1 Tablet By Mouth Once A Day As Needed 18)  Januvia 100 Mg Tabs (Sitagliptin Phosphate) .... Take 1 Tablet By Mouth Once A Day  Allergies (verified): 1)  ! Cardizem 2)  ! Niacin  Comments:  Nurse/Medical Assistant: The patient's medication bottles and allergies were reviewed with the patient and were updated in the Medication and Allergy Lists.  Past History:  Past Medical History: Last updated: 05/18/2009 OVERWEIGHT/OBESITY (ICD-278.02) CAD, ARTERY BYPASS GRAFT (ICD-414.04) HYPERLIPIDEMIA-MIXED (ICD-272.4) ATRIAL FIBRILLATION (ICD-427.31) DYSPNEA (ICD-786.05) Diabetes mellitus OSA Insomnia  1. Chronic exertional dyspnea secondary to deconditioning. 2. Coronary artery disease, status post coronary artery bypass     grafting. 3. Permanent atrial fibrillation on warfarin therapy. 4. Diabetes mellitus. 5. Dyslipidemia. 6. Obstructive sleep apnea. 7. Obesity. 8. Deconditioning. 9. Mediastinal adenopathy resolved by last CT scan. 10.Insomnia.   Past Surgical History: Last updated: 05/02/2009 CABG Carpal  tunnel surgery. lipoma surgery.  Family History: Last updated: 05/02/2009 Family History of Coronary Artery Disease:   Social History: Last updated: 05/02/2009 Full Time Married  Tobacco Use - Former.   Risk Factors: Smoking Status: quit (05/17/2010)  Review of Systems       The patient complains of fatigue, weight gain/loss, and shortness of breath.  The patient denies malaise,  fever, vision loss, decreased hearing, hoarseness, chest pain, palpitations, prolonged cough, wheezing, sleep apnea, coughing up blood, abdominal pain, blood in stool, nausea, vomiting, diarrhea, heartburn, incontinence, blood in urine, muscle weakness, joint pain, leg swelling, rash, skin lesions, headache, fainting, dizziness, depression, anxiety, enlarged lymph nodes, easy bruising or bleeding, and environmental allergies.    Vital Signs:  Patient profile:   65 year old male Height:      69 inches Weight:      296 pounds BMI:     43.87 Pulse rate:   75 / minute BP sitting:   130 / 75  (left arm) Cuff size:   large  Vitals Entered By: Carlye Grippe (May 17, 2010 2:23 PM)  Nutrition Counseling: Patient's BMI is greater than 25 and therefore counseled on weight management options.  Physical Exam  Additional Exam:  General: Well-developed, well-nourished in no distress head: Normocephalic and atraumatic eyes PERRLA/EOMI intact, conjunctiva and lids normal nose: No deformity or lesions mouth normal dentition, normal posterior pharynx neck: Supple, no JVD.  No masses, thyromegaly or abnormal cervical nodes lungs: Normal breath sounds bilaterally without wheezing.  Normal percussion heart:ir regular rate and rhythm with normal S1 and S2, no S3 or S4.  PMI is normal.  No pathological murmurs abdomen: Normal bowel sounds, abdomen is soft and nontender without masses, organomegaly or hernias noted.  No hepatosplenomegaly musculoskeletal: Back normal, normal gait muscle strength and tone normal pulsus: Pulse is normal in all 4 extremities Extremities: No peripheral pitting edema neurologic: Alert and oriented x 3 skin: Intact without lesions or rashes cervical nodes: No significant adenopathy psychologic: Normal affect    EKG  Procedure date:  05/17/2010  Findings:      atrial fibrillation heart rate 87 beats per minute nonspecific ST-T wave changes  Impression &  Recommendations:  Problem # 1:  ATRIAL FIBRILLATION (ICD-427.31) the patient is in permanent atrial fibrillation but tolerating this well. Continue coumadin His updated medication list for this problem includes:    Warfarin Sodium 5 Mg Tabs (Warfarin sodium) ..... Use as directed    Metoprolol Tartrate 50 Mg Tabs (Metoprolol tartrate) .[name]... Take 1&1/2 tablet by mouth two times a day    Digoxin 0.125 Mg Tabs (Digoxin) .[name]... Take 1 tablet by mouth once a day    Aspirin 81 Mg Tbec (Aspirin) .[name]... Take 1 tablet by mouth once a day  Orders: EKG w/ Interpretation (93000)  Problem # 2:  OVERWEIGHT/OBESITY (ICD-278.02) I again counseled the patient about his weight. I encouraged him to eat smaller meals and not to eat at night  Problem # 3:  HYPERLIPIDEMIA-MIXED (ICD-272.4) followed by his primary care physician His updated medication list for this problem includes:    Crestor 20 Mg Tabs (Rosuvastatin calcium) .[name]... Take 1 tablet by mouth once a day  Problem # 4:  ANEMIA (ICD-285.9) I asked  patient to discuss this with his primary care physician to see if he still needs to take FeSO4  Patient Instructions: 1)  Your physician wants you to follow-up in: 6 months. You will receive a reminder letter in the mail one-two  months in advance. If you don't receive a letter, please call our office to schedule the follow-up appointment. 2)  Your physician recommends that you continue on your current medications as directed. Please refer to the Current Medication list given to you today.

## 2010-08-26 NOTE — Procedures (Signed)
Summary: Colonoscopy  Patient: Kwasi Joung Note: All result statuses are Final unless otherwise noted.  Tests: (1) Colonoscopy (COL)   COL Colonoscopy           DONE     [facility]     [address]     [place], [place]  [address]           COLONOSCOPY PROCEDURE REPORT           PATIENT:  Delawrence, Fridman  MR#:  [id number]     BIRTHDATE:  03-30-1946, 64 yrs. old  GENDER:  male     ENDOSCOPIST:  Wilhemina Bonito. Eda Keys, MD     REF. BY:  [facility],     PROCEDURE DATE:  08/09/2010     PROCEDURE:  Colonoscopy with snare polypectomy x 1     ASA CLASS:  Class III     INDICATIONS:  Iron deficiency anemia, heme positive stool     MEDICATIONS:   Fentanyl 75 mcg IV, Versed 6 mg           DESCRIPTION OF PROCEDURE:   After the risks benefits and     alternatives of the procedure were thoroughly explained, informed     consent was obtained.  Digital rectal exam was performed and     revealed no abnormalities.   The [id number] ([id number]) endoscope was     introduced through the anus and advanced to the cecum, which was     identified by both the appendix and ileocecal valve, without     limitations.  The quality of the prep was good, using Colyte.  The     instrument was then slowly withdrawn as the colon was fully     examined.     <<PROCEDUREIMAGES>>           FINDINGS:  The terminal ileum appeared normal.  A diminutive polyp     was found in the distal transverse colon. Polyp was snared without     cautery. Retrieval was successful. snare polyp  Otherwise normal     colonoscopy without other polyps, masses, vascular ectasias, or     inflammatory changes.   Retroflexed views in the rectum revealed     no abnormalities.    The scope was then withdrawn from the patient     and the procedure completed.           COMPLICATIONS:  None     ENDOSCOPIC IMPRESSION:     1) Normal terminal ileum     2) Diminutive polyp in the distal transverse colon - removed     3)  Otherwise normal colonoscopy     RECOMMENDATIONS:     1) Repeat colonoscopy in 5 years with Dr. Hennie Duos if polyp     adenomatous; otherwise 10 years     2) Upper endoscopy today           ______________________________     Wilhemina Bonito. Eda Keys, MD           CC:  [facility], Cecille Amsterdam; Dorathy Daft; Kinnie Scales, MD, The Patient           n.     eSIGNED:   Wilhemina Bonito. Eda Keys at 08/09/2010 01:40 PM           Samuel Jester, [id number]  Note: An exclamation mark (!) indicates a result that was not dispersed into the flowsheet. Document Creation Date:  08/09/2010 1:40 PM _______________________________________________________________________  (1) Order result status: Final Collection or observation date-time: 08/09/2010 13:31 Requested date-time:  Receipt date-time:  Reported date-time:  Referring Physician:   Ordering Physician: Fransico Setters [phone]) Specimen Source:  Source: Launa Grill Order Number: [id number] Lab site:

## 2010-08-26 NOTE — Procedures (Signed)
Summary: Upper Endoscopy  Patient: [name] Note: All result statuses are Final unless otherwise noted.  Tests: (1) Upper Endoscopy (EGD)   EGD Upper Endoscopy       DONE     [facility]     [address]     [place], [place]  [address]           ENDOSCOPY PROCEDURE REPORT           PATIENT:  [name], [name]  MR#:  [id number]     BIRTHDATE:  1946/01/17, 64 yrs. old  GENDER:  male           ENDOSCOPIST:  Wilhemina Bonito. Eda Keys, MD     Referred by:  [facility],           PROCEDURE DATE:  08/09/2010     PROCEDURE:  EGD with biopsy, [address],     EGD for control of bleeding (endo     clip x 2)     ASA CLASS:  Class III     INDICATIONS:  iron deficiency anemia, hemoccult positive stool           MEDICATIONS:   There was residual sedation effect present from     prior procedure., Versed 2 mg IV     TOPICAL ANESTHETIC:  Cetacaine Spray           DESCRIPTION OF PROCEDURE:   After the risks benefits and     alternatives of the procedure were thoroughly explained, informed     consent was obtained.  The [id number] ([id number]) endoscope was     introduced through the mouth and advanced to the third portion of     the duodenum, without limitations.  The instrument was slowly     withdrawn as the mucosa was fully examined.     <<PROCEDUREIMAGES>>           The esophagus and gastroesophageal junction were completely normal     in appearance.  A soft 1cm pedunculated polyp with superficial     ulceration and friability was found in the anterior mid body of     the stomach. This was biopsied then endoclipped x 2 at the base.     Otherwise normal stomach.  The duodenal bulb was normal in     appearance, as was the postbulbar duodenum.    Retroflexed views     revealed a hiatal hernia.    The scope was then withdrawn from the     patient and the procedure completed.           COMPLICATIONS:  None           ENDOSCOPIC IMPRESSION:     1) Normal esophagus     2)  Ulcerated Pedunculated polyp in the body of the stomach - s/p     bx then clip x 2     3) Otherwise normal stomach     4) Normal duodenum           RECOMMENDATIONS:     1) Continue PPI daily     2) Avoid NSAIDS     3) Ok to resume coumadin if medically important. If medically     reasonable (as deemed by the patients cardiologist or primary     provider), it would be best to hold for one week     4) Follow up biopsies     5) Transfuse 2 more units today  6) FeSO4 324mg  daily indefinitely           ______________________________     Wilhemina Bonito. Eda Keys, MD           CC:  [facility], Cecille Amsterdam; Dorathy Daft; Ivor Messier; The Patient           n.     eSIGNED:   Wilhemina Bonito. Eda Keys at 08/09/2010 02:11 PM           Samuel Jester, [id number]  Note: An exclamation mark (!) indicates a result that was not dispersed into the flowsheet. Document Creation Date: 08/09/2010 2:12 PM _______________________________________________________________________  (1) Order result status: Final Collection or observation date-time: 08/09/2010 13:56 Requested date-time:  Receipt date-time:  Reported date-time:  Referring Physician:   Ordering Physician: Fransico Setters [phone]) Specimen Source:  Source: Launa Grill Order Number: [id number] Lab site:

## 2010-11-26 ENCOUNTER — Other Ambulatory Visit: Payer: Self-pay | Admitting: *Deleted

## 2010-11-26 DIAGNOSIS — I2510 Atherosclerotic heart disease of native coronary artery without angina pectoris: Secondary | ICD-10-CM

## 2010-11-26 DIAGNOSIS — I4891 Unspecified atrial fibrillation: Secondary | ICD-10-CM

## 2010-11-26 DIAGNOSIS — I10 Essential (primary) hypertension: Secondary | ICD-10-CM

## 2010-11-26 MED ORDER — POTASSIUM CHLORIDE CRYS ER 20 MEQ PO TBCR: 20.0000 meq | EXTENDED_RELEASE_TABLET | Freq: Two times a day (BID) | ORAL | Status: DC

## 2010-11-26 MED ORDER — FUROSEMIDE 80 MG PO TABS: 80.0000 mg | ORAL_TABLET | Freq: Every day | ORAL | Status: DC

## 2010-11-26 MED ORDER — DIGOXIN 125 MCG PO TABS: 125.0000 ug | ORAL_TABLET | Freq: Every day | ORAL | Status: DC

## 2010-11-26 MED ORDER — METOPROLOL TARTRATE 50 MG PO TABS: ORAL_TABLET | ORAL | Status: DC

## 2010-12-07 NOTE — Assessment & Plan Note (Signed)
[facility]                          [facility] OFFICE NOTE   [name], [name]                      MRN:          [id number]  DATE:12/24/2007                            DOB:          [birth year]    HISTORY OF PRESENT ILLNESS:  The patient is a 65 year old male with a  history of coronary artery disease status post coronary artery bypass  grafting.  The patient is doing well.  He reports no chest pain.  He  does have chronic dyspnea secondary to deconditioning.  He has been  diagnosed with obstructive sleep apnea and placed on a CPAP.  He denies  any orthopnea, PND.  He has no palpitations or syncope.   MEDICATIONS:  As listed in the chart including Metformin, Celebrex,  Lisinopril, NovoLog, ferrous sulfate, glimipride, Actos, Crestor, and  Verapamil.   PHYSICAL EXAMINATION:  VITAL SIGNS:  Blood pressure 140/76, heart rate  83, weight is 295 pounds.  NECK EXAM:  Normal carotid upstroke, and no carotid bruits.  LUNGS:  Clear breath sounds bilaterally.  HEART:  Regular rate and rhythm with normal S1, S2, no murmurs or  gallops.  ABDOMEN:  Soft, nontender, no rebound or organomegaly, good bowel  sounds.  EXTREMITY EXAM:  No cyanosis, clubbing or edema.  NEURO:  The patient is alert, oriented and grossly nonfocal.   PROBLEMS:  1. Chronic exertional dyspnea.  2. Coronary artery disease status post coronary artery bypass grafting      __________  June 2006.  3. History of __________  atrial fibrillation, chronic Coumadin      therapy.  4. Diabetes mellitus.  5. Dyslipidemia.  6. Obstructive sleep apnea.  7. Obesity.  8. Deconditioning.  9. Mediastinal adenopathy, resolved by last CT scan.   PLAN:  1. The patient is doing well from a cardiovascular perspective.  He is      now in permanent atrial fibrillation and can continue on rate      control Medications.  2. The patient continues on Coumadin.  3. I have asked the patient to  discuss __________  issues regarding      his CPAP and inability to sleep at night.  4. The patient can follow up with [place] in the next six months.   Of note, the patient does need a stress test prior to his next office  visit.     [name], MD,FACC  Electronically Signed    GED/MedQ  DD: 12/24/2007  DT: 12/24/2007  Job #: [id number]   cc:   Quita Skye. Artis Flock, M.D.

## 2010-12-07 NOTE — Assessment & Plan Note (Signed)
[facility]                          [facility]ICE NOTE   LEEMON, AYALA                      MRN:          [id number]  DATE:10/06/2008                            DOB:          1946-02-27    HISTORY OF PRESENT ILLNESS:  The patient is a 65 year old male with  history of coronary artery disease, status post coronary artery bypass  grafting.  The patient reports no specific symptoms of angina.  He has  an occasional sharp chest pain both at rest on exertion, but very short  lived.  He has not used any nitroglycerin.  He remains dyspneic due to  deconditioning and his obesity.  He unfortunately cannot sleep well with  his CPAP mask either.  He has no palpitations or syncope.  The patient  is in emotional distress as his daughter who has been diagnosed with  uterine cancer, but cannot be operated on due to her weight which is  over 500 pounds.  He also has a son with diabetes mellitus both him and  his wife are somewhat irritable because of social issues they are  battling.  Mr. Boley has difficulty sleeping.  He is able to fall  asleep fairly quickly, but then wakes up an hour later, and he is up all  night.  He feels exhausted during the daytime.   MEDICATIONS:  1. Multivitamin daily.  2. Fish oil 1200 mg p.o. daily.  3. Omeprazole 20 mg 2 tablets p.o. b.i.d.  4. Metformin 5 mg 3 tablets q.a.m.  5. Verapamil 80 mg p.o. t.i.d.  6. Potassium 20 mEq p.o. b.i.d.  7. Warfarin 5 mg as directed.  8. Celebrex 100 mg a day.  9. Crestor 20 mg p.o. daily.  10.Ferrous sulfate 324 mg 1-1/2 tablet p.o. b.i.d.  11.Lisinopril 10 mg p.o. daily.  12.Metoprolol tartrate 50 mg 1-1/2 tablets p.o. b.i.d.  13.Digoxin 0.125 daily.  14.Glimepiride 4 mg p.o. daily.  15.Aspirin 81 mg p.o. daily.  16.Actos 15 mg p.o. daily.  17.Lasix 80 mg p.o. daily.   PHYSICAL EXAMINATION:  VITAL SIGNS:  Blood pressure is 139/80, heart  rate 65, weight is 322 pounds.  NECK:  Normal carotid upstroke.  No carotid bruits.  LUNGS:  Clear breath sounds bilaterally.  HEART:  Regular rate and rhythm.  Normal S1 and S2.  No murmurs, rubs,  or gallops.  ABDOMEN:  Soft, nontender.  No rebound or guarding.  Good bowel sounds.  Protuberant abdomen.  EXTREMITY:  Acrodermatitis, but no cyanosis, clubbing, or edema.  NEURO:  The patient is alert and oriented, grossly nonfocal.   PROBLEM LIST:  1. Chronic exertional dyspnea secondary to deconditioning.  2. Coronary artery disease, status post coronary artery bypass      grafting.  3. Permanent atrial fibrillation on warfarin therapy.  4. Diabetes mellitus.  5. Dyslipidemia.  6. Obstructive sleep apnea.  7. Obesity.  8. Deconditioning.  9. Mediastinal adenopathy resolved by last CT scan.  10.Insomnia.   PLAN:  1. The patient has significant problems with insomnia likely due to      his difficult social situation.  I have given him a prescription      for trazodone 50 mg p.o daily.  He can increase it to 100 mg p.o.      daily.  2. I also gave him a nitroglycerin spray, although I do not think that      he has any specific symptoms of angina.  He is due in the next 6      months; however, for a stress test which will be ordered on his      next clinic visit.     [name], MD,FACC  Electronically Signed    GED/MedQ  DD: 10/06/2008  DT: 10/07/2008  Job #: [id number]   cc:   [name], M.D.

## 2010-12-07 NOTE — Assessment & Plan Note (Signed)
[facility]                          [facility]ICE NOTE   [name], [name]                      MRN:          [id number]  DATE:04/10/2007                            DOB:          01/02/1946    REFERRING PHYSICIAN:  Quita Skye. Artis Flock, M.D.   HISTORY OF PRESENT ILLNESS:  The patient is a 65 year old male with a  history of coronary artery disease status post coronary artery bypass  grafting.  The patient has stable chronic exertional dyspnea which  appears to be multifactorial and is listed as below.  He also has a  history of atrial fibrillation which has now become permanent.  The  patient is on Coumadin anticoagulation given his increased risk factor  profile.  We have opted to leave the patient in atrial fibrillation as  we were unable to maintain him in a normal sinus rhythm with amiodarone.  The patient is due for a liver function test reportedly done by Dr.  Lucianne Muss.  He denies any substernal chest pain.  Unfortunately, he has not  lost any weight.  He states that he tries to cut back on his caloric  intake.   MEDICATIONS:  1. Metformin ER 500 mg p.o. t.i.d.  2. Lantus insulin.  3. Celebrex.  4. Lisinopril.  5. Digoxin 125 mcg p.o. daily.  6. Multivitamin.  7. Pravachol 80 mg p.o. q.h.s.  8. Coumadin as directed.  9. Metoprolol 50 mg, one and a half tablets p.o. b.i.d.  10.Lasix 80 mg p.o. daily.  11.Ferrous sulfate daily.  12.Fish oil.  13.Verapamil 80 mg p.o. t.i.d.  14.Aspirin 81 mg p.o. daily.   PHYSICAL EXAMINATION:  VITAL SIGNS:  Blood pressure is 125/77, heart  rate 124 beats per minute, weight is 286 pounds.  NECK:  Normal carotid upstrokes.  No carotid bruits.  LUNGS:  Clear breath sounds bilaterally.  HEART:  Regular rate and rhythm with normal S1 S2.  ABDOMEN:  Soft.  EXTREMITIES:  No cyanosis, clubbing, or edema.   PROBLEM LIST:  Previously listed in my note from September 18, 2006, and  these include:  1.  Chronic exertional dyspnea which is multifactorial.  2. Coronary artery disease status post coronary artery bypass grafting      and CYPHER stent in June 2006.  3. History of permanent atrial fibrillation on chronic Coumadin      therapy.  4. Diabetes mellitus, insulin-dependent.  5. Fluid retention.  6. Hyperlipidemia.  7. Obstructive sleep apnea.  8. Obesity.  9. Deconditioning.  10.Mediastinal adenopathy.   PLAN:  1. The patient is due for a followup CT scan in December.  This will      be arranged.  We will also forward this to Dr. Artis Flock who can      continue to monitor this.  2. Reportedly the patient has abnormal liver function tests performed      by Dr. Lucianne Muss but I do not have results available.  We will try to      obtain these.  In the meanwhile, I will cut his Pravachol to 40 mg  p.o. q.h.s.  3. The patient can continue on Coumadin.  4. The patient will remain in atrial fibrillation.  He has been rate      controlled.  We have no immediate plans to restore a normal sinus      rhythm.  5. The patient will follow up with Dr. Artis Flock as well as Dr. Lucianne Muss.  6. We will see the patient back in 6 months.  His heart disease      currently appears to be stable.     [name], MD,FACC  Electronically Signed    GED/MedQ  DD: 05/14/2007  DT: 05/14/2007  Job #: [id number]   cc:   Quita Skye. Artis Flock, M.D.

## 2010-12-10 NOTE — Cardiovascular Report (Signed)
NAMEUMAR, PATMON               ACCOUNT NO.:  [id number]   MEDICAL RECORD NO.:  [id number]          PATIENT TYPE:  INP   LOCATION:  6527                         FACILITY:  [facility]   PHYSICIAN:  Salvadore Farber, M.D. LHCDATE OF BIRTH:  05-Dec-1945   DATE OF PROCEDURE:  01/19/2005  DATE OF DISCHARGE:                              CARDIAC CATHETERIZATION   PROCEDURE:  Drug-eluting stent placement in the proximal circumflex.   INDICATIONS:  Mr. Stirewalt is a 65 year old gentleman who underwent coronary  artery bypass grafting in 2004 by Dr. Dorris Fetch. He now presents with  unstable angina. Diagnostic angiography by Dr. 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 There was a relatively  focal 80% stenosis in the proximal native circumflex. EF is preserved. He  returns the lab for planned percutaneous intervention on the native  circumflex.   PROCEDURAL TECHNIQUE:  The preexisting 6 French right common femoral  arterial sheath was exchanged over a wire for a fresh 6 [place] sheath.  Anticoagulation was initiated with bivalirudin. ACT was confirmed to be  greater than 225 seconds. A 6 French left Voda 3.5 guide was advanced over a  wire and engaged in the ostium of the left main. A [place] wire was  advanced to the distal circumflex without difficulty. The lesion was  directly stented using a 3.0 x 18 mm Cypher deployed at 16 atmospheres. This  stent was post dilated using a 3.25 x 15 mm Quantum at 18 atmospheres. Final  angiography demonstrated no residual stenosis and TIMI-III flow to the  distal vasculature. The patient tolerated the procedure well and was  transferred to the holding room in stable condition.   COMPLICATIONS:  None.   IMPRESSION/RECOMMENDATIONS:  Successful drug-eluting stent placement in the  proximal circumflex. The patient will be continued on Plavix for at  least a  year. Aspirin should be continued indefinitely.       WED/MEDQ  D:  01/19/2005  T:  01/19/2005  Job:  [id number]   cc:   Vale Haven. Andrey Campanile, M.D.  [address]  [place]  [place] [address]  Fax: [phone]

## 2010-12-10 NOTE — Assessment & Plan Note (Signed)
[facility]                          [facility] OFFICE NOTE   [name], [name]                      MRN:          [id number]  DATE:09/18/2006                            DOB:          12-25-1945    HISTORY OF PRESENT ILLNESS:  The patient is a 65 year old male with  known coronary artery disease status post coronary artery bypass  grafting and Cypher stent to the circumflex performed in June 2006.  The  patient stated that he has no recurrent substernal chest pain.  He does  have dyspnea on exertion.  He is not aware of palpitations.  He reports  no orthopnea or PND.  He stated that he continues to gain weight and is  dyspneic on moderate exertion.  Listening to the patient's heart today  on exam, it appeared that he went back into atrial fibrillation.   MEDICATIONS:  1. Potassium 20 mEq p.o. b.i.d.  2. Plavix 75 mg p.o. daily.  3. Digoxin 125 mcg p.o. daily.  4. Pravachol 80 mg p.o. q.h.s.  5. Omeprazole 20 mg two tablets b.i.d.  6. Avandia has been discontinued.  7. Coumadin as directed.  8. Metoprolol 75 mg p.o. b.i.d.  9. Lasix 80 mg p.o. daily.  10.Metformin 750 mg p.o. daily.  11.Ferrous sulfate.  12.Fish oil.  13.Verapamil 80 mg p.o. t.i.d.  14.Amiodarone 200 mg p.o. daily.  15.Aspirin 81 mg a day.  16.Diclofenac.  17.Lantus insulin.   PHYSICAL EXAMINATION:  VITAL SIGNS:  Blood pressure is 144/96, heart  rate is 107 beats per minute, weight is 291 pounds.  GENERAL:  Overweight white male but in no apparent distress.  HEENT:  Pupils isocoric, conjunctivae clear.  NECK:  Supple.  Normal carotid upstroke, no carotid bruits.  LUNGS:  Clear breath sounds bilaterally.  HEART:  Irregular rate and rhythm.  Normal S1, S2.  No murmurs, rubs or  gallops.  ABDOMEN:  Soft.  EXTREMITIES:  No cyanosis, clubbing or edema.  NEUROLOGIC:  The patient alert, oriented, and grossly nonfocal.   PROBLEM LIST:  1. Chronic exertional dyspnea,  stable (multifactorial).      a.     Sleep apnea.      b.     Hypertensive heart disease.      c.     Diastolic dysfunction.  2. Coronary artery disease.      a.     Status post coronary artery bypass grafting 2004.      b.     Status post catheterization April 2007 with patent graft       anatomy.      c.     Status post Cypher stent to the proximal circumflex June       2006.  3. History of paroxysmal atrial fibrillation.      a.     Recurrent atrial fibrillation.      b.     Asymptomatic.      c.     Chronic Coumadin therapy.  4. Diabetes mellitus, insulin-dependent.  5. Fluid retention on Avandia.  6. Hyperlipidemia.  7. Obstructive sleep apnea,  pending titration of CPAP.  8. Obesity.  9. Deconditioning.  10.Hilar and mediastinal adenopathy; followup CT scan pending.   PLAN:  1. The patient is now back in atrial fibrillation.  He is, however,      rate controlled and he is asymptomatic.  2. In order to simplify the patient's medical regimen and due to the      fact that there is no difference in rhythm versus rate strategy      perspective in regards to mortality, we will proceed with      discontinue amiodarone.  3. I have advised the patient that we may need to increase his      verapamil in the future now that amiodarone has been discontinued.  4. PFTs were reviewed and were within normal limits in regards to      amiodarone.  Amiodarone has now been discontinued.  5. The patient will have a CT scan ordered of the chest given his      hilar and mediastinal adenopathy.  This will need careful followup      and results will be forwarded to Dr. Artis Flock.  6. The patient was given recommendation to start on lisinopril for      hypertension, particularly in light of his diabetes.  7. The patient will need to have a BMET done in 1 week.  This can be      done in the Coumadin clinic.  8. I have asked the patient to come early to the Coumadin clinic,      given the modification  in his medical regimen with amiodarone,      anticipating a decrease in his INR levels.  9. The patient will see me back in 6 months.  I did also recommend to      the patient to stop aspirin in June 2008.  He can now switch back      to aspirin and discontinue Plavix in conjunction with Coumadin.     [name], MD,FACC  Electronically Signed   GED/MedQ  DD: 09/18/2006  DT: 09/18/2006  Job #: [id number]   cc:   Quita Skye. Artis Flock, M.D.

## 2010-12-10 NOTE — Discharge Summary (Signed)
[name], [name]               ACCOUNT NO.:  [id number]   MEDICAL RECORD NO.:  [id number]          PATIENT TYPE:  INP   LOCATION:  6527                         FACILITY:  [facility]   PHYSICIAN:  [name], M.D.     DATE OF BIRTH:  14-Aug-1945   DATE OF ADMISSION:  01/17/2005  DATE OF DISCHARGE:  01/20/2005                                 DISCHARGE SUMMARY   PRIMARY CARE PHYSICIAN:  [name].   CARDIOLOGIST:  [name].   DISCHARGE DIAGNOSIS:  Coronary artery disease status post cardiac  catheterization on January 19, 2005, resulting in a drug-eluting stent  placement in the proximal circumflex with Plavix therapy at least one year  and aspirin indefinitely.   PAST MEDICAL HISTORY:  1. Coronary artery disease status post coronary artery bypass grafting in      2004 with a LIMA to the LAD, saphenous vein graft to the diagonal one      and a radial to obtuse marginal branch.  2. Diabetes.  3. Hypertension.  4. Dyslipidemia.  5. Tobacco use.  6. Obesity.  7. COPD/emphysema.  8. Paroxysmal atrial fibrillation resulting in anticoagulation therapy.   DISPOSITION:  The patient is being discharged home with instructions to  follow a low fat, low salt, diabetic diet.   ACTIVITY:  Increase activity slowly.  He may shower, no tub bathing x2 days.  No driving for two days.  No lifting over 10 pounds x1 week.   MEDICATIONS:  1. Aspirin 81 mg daily.  2. Plavix 75 mg daily x1 year.  3. Nexium 40 mg p.o. increased to b.i.d.  4. Ferrous sulfate 325 mg p.o. t.i.d.  5. Lasix 80 mg daily.  6. Amiodarone 100 mg daily.  7. Pravachol 80 mg daily.  8. Digoxin 0.125 mg daily.  9. Verapamil 80 mg p.o. t.i.d.  10.Metoprolol 50 mg p.o. b.i.d.  11.Potassium 20 mEq two tablets daily.  12.Metformin as previously taken.  The patient instructed not to resume      until Saturday.  13.No Coumadin per [name] request until reevaluated by [name].   Follow-up with Dr. Margarita Mail  PA on January 28, 2005 at 8:15.  Followup  appointment with [name] scheduled for April 26, 2005 at 9:15 at which  time [name] and the patient will reevaluate medications including  amiodarone and Coumadin.  The patient instructed to call our office for any  fever, any apparent swelling from cath site.   HOSPITAL COURSE:  [name] is a patient of Dr. Tawana Scale followed by Dr.  Andee [name] from Cardiology, known history of CAD status post coronary artery  bypass grafting.  [name] admitted this admission for several days of  intermittent chest discomfort similar to his symptoms pre bypass surgery.  On admission his EKG showed nonspecific T wave inversion inferior leads with  no acute ST segment elevation.  Cardiac enzymes negative.  The patient with  multiple cardiac risk factors including his diabetes and hypertension, known  history of CAD.  The patients initial INR was 2.0, Coumadin was  discontinued, the patient placed  on heparin prophylactically.  2D  echocardiogram on January 18, 2005 showing a left ventricular ejection fraction  estimated at 55-65%, study was inadequate for the evaluation of left  ventricular regional wall motion.  Study was technically difficult as  clinically indicated it was felt that the patient would benefit from a  transesophageal echocardiogram for additional information.  The patient was  given p.o. vitamin K and INR repeated, still greater than 2, the patient was  scheduled for cardiac catheterization on January 19, 2005, results as stated  above.  The patient tolerated the procedure without complications.  Seen by  Dr. Eden Emms on day of discharge, vital signs stable, normal sinus rhythm.  The patient being discharged home with followup with [name] as stated  above.      Mic   MB/MEDQ  D:  01/20/2005  T:  01/20/2005  Job:  [id number]   cc:   Andrey Campanile, MD

## 2010-12-10 NOTE — Op Note (Signed)
NAME:  ARGIL, MAHL                         ACCOUNT NO.:  [id number]   MEDICAL RECORD NO.:  [id number]                   PATIENT TYPE:  OIB   LOCATION:  2899                                 FACILITY:  [facility]   PHYSICIAN:  Salvatore Decent. Dorris Fetch, M.D.         DATE OF BIRTH:  Jan 28, 1946   DATE OF PROCEDURE:  01/06/2003  DATE OF DISCHARGE:                                 OPERATIVE REPORT   PREOPERATIVE DIAGNOSIS:  Recurrent seromas, right lower extremity.   POSTOPERATIVE DIAGNOSES:  Recurrent seromas, right lower extremity.   PROCEDURE:  Revision of right leg wound.   SURGEON:  Salvatore Decent. Dorris Fetch, M.D.   ANESTHESIA:  Local anesthesia with intravenous sedation and monitored  anesthetic care.   FINDINGS:  Seroma, right lower extremity.  Pseudocapsule formation.  Saphenous nerve identified and left intact.  Pseudocapsule left in place  over the saphenous nerve.   CLINICAL NOTE:  Mr. Bojarski is a 65 year old gentleman who is status post  coronary artery bypass grafting.  He had open saphenous vein harvesting from  his right lower extremity.  Postoperatively he developed recurrent seromas.  This did not respond to conservative measurements with repeated aspirations  and compression.  Options were discussed with the patient, including open  the wound with V.A.C. placement versus continued conservative management  versus revision of the wound.  He understood that there was a possibility  that this would not be successful.  Other risks included bleeding,  infection, and recurrent seroma.  Mr. Gelin accepted the risks and agreed  to proceed.   OPERATIVE NOTE:  Mr. Greulich was brought to the preop holding area on January 06, 2003.  Intravenous access was obtained.  Mr. Mangiaracina was taken to the  operating room.  Monitored anesthetic care was performed under the direction  of Judie Petit, M.D.  The patient had good sedation throughout.  The  right leg was prepped and draped in the  usual fashion.  Lidocaine 1% was  infiltrated along the incision and the previous scar was incised.  Approximately 20 mL of clear serous fluid was evacuated.  There was  pseudocapsule formation with epithelialized tissue in the wound.  There was  no clue of site of the fluid leak.  The pseudocapsular tissue was excised.  A rim of the pseudocapsule was left on either side of the saphenous nerve,  which was identified.  The skin edges were excised.  Hemostasis was achieved  with electrocautery.  The wound was copiously irrigated with saline.  The  wound then was closed.  A 2-0 Vicryl interrupted suture layer was placed  deep tissue, and again care was taken not to entrap or entangle the  saphenous nerve.  A 3-0 Vicryl suture then was used to place the deep dermal  layer, and the skin was closed with staples.  The wound was wrapped and an  Ace bandage was applied.  The patient tolerated the procedure well  and was  taken from the operating room to the postanesthetic care unit in stable  condition.                                               Salvatore Decent Dorris Fetch, M.D.    SCH/MEDQ  D:  01/06/2003  T:  01/06/2003  Job:  [id number]

## 2010-12-10 NOTE — Op Note (Signed)
NAME:  [name], [name]                         ACCOUNT NO.:  [id number]   MEDICAL RECORD NO.:  [id number]                   PATIENT TYPE:  AMB   LOCATION:  DSC                                  FACILITY:  [facility]   PHYSICIAN:  Cindee Salt, M.D.                    DATE OF BIRTH:  Apr 20, 1946   DATE OF PROCEDURE:  10/08/2003  DATE OF DISCHARGE:                                 OPERATIVE REPORT   PREOPERATIVE DIAGNOSIS:  Carpal tunnel syndrome, left hand.   POSTOPERATIVE DIAGNOSIS:  Carpal tunnel syndrome, left hand.   OPERATION PERFORMED:  Decompression of the left median nerve.   SURGEON:  Cindee Salt, M.D.   ASSISTANTCarolyne Fiscal, RN.   ANESTHESIA:  Forearm based IV regional.   INDICATIONS FOR PROCEDURE:  The patient is a 65 year old male with a history  of carpal tunnel syndrome, EMG nerve conduction studies positive which has  not responded to conservative treatment.   DESCRIPTION OF PROCEDURE:  The patient was brought to the operating room  where forearm based IV regional anesthetic was carried out without  difficulty.  He was prepped using DuraPrep in supine position, left arm  free.  A longitudinal incision was made in the palm and carried down through  subcutaneous tissue.  Bleeders were electrocauterized.  The palmar fascia  was split.  Superficial palmar arch was identified.  The flexor tendon to  the ring and little finger identified to the ulnar side of the medial nerve.  The carpal retinaculum was incised with sharp dissection.  A right angle and  Sewell retractor were placed between skin and forearm fascia.  The fascia  was released for approximately 3 cm proximal to the wrist crease.  Canal was  extremely tight.  The carpal retinaculum was extremely thick.  The Sewell  retractors, right angle retractor were positioned and the retinaculum and  forearm fascia were released for approximately 1.5 cm to 2 cm proximal to  the wrist crease under direct vision.  Canal was explored.   An hourglass  deformity with hyperemia of the nerve was apparent.  The wound was  irrigated.  The skin was closed with interrupted 5-0 nylon suture.  Sterile  compressive dressing and splint were applied.  The patient tolerated the  procedure well and was taken to the recovery room for observation in  satisfactory condition.  He is discharged to home to return to the [facility] [place] in one week on Talwin NX and Keflex.                                               Cindee Salt, M.D.    GK/MEDQ  D:  10/08/2003  T:  10/09/2003  Job:  [id number]

## 2010-12-10 NOTE — H&P (Signed)
NAME:  [name], [name]                         ACCOUNT NO.:  [id number]   MEDICAL RECORD NO.:  [id number]                   PATIENT TYPE:  INP   LOCATION:  2037                                 FACILITY:  [facility]   PHYSICIAN:  Salvadore Farber, M.D. [facility]         DATE OF BIRTH:  [birth year]   DATE OF ADMISSION:  06/15/2002  DATE OF DISCHARGE:                                HISTORY & PHYSICAL   CHIEF COMPLAINT:  Palpitations.   HISTORY OF PRESENT ILLNESS:  The patient is a 65 year old man followed by  Pricilla Riffle, M.D. at [facility], who was recently seen in the ER  at [facility] on 10/22 for atrial fibrillation and rapid ventricular  response.  The patient was given Cardizem but had an allergic reaction.  However, the patient converted from atrial fibrillation into normal sinus  rhythm and was continued on the beta blocker at the time.  This evening,  after dinner, the patient began having palpitations but no other symptoms  including shortness of breath, chest pain, syncope, near syncope, etc.  He  came to the ER and was found to have atrial fibrillation with a heart rate  between 140 and 160 with mild chest pain at that time.  His EKG showed no  ischemic changes.  In the ER, the patient was given IV metoprolol x2 but did  not have any significant slowing of his heart rhythm.  The patient further  states that in the recent month, he has been doing quite well without any  other symptoms.   PAST MEDICAL HISTORY:  1. Chronic dyspnea.  2. Paroxysmal atrial fibrillation on chronic Coumadin therapy.  3. The patient reports that two recent stress tests in the last year were     negative, and he states that an echocardiogram was recently normal.  4. Obesity.   MEDICATIONS AT HOME:  1. Vioxx 25 mg q.d.  2. Nexium 20 mg q.d.  3. Paxil 25 mg q.d.  4. Toprol-XL 25 mg q.d.  5. He was previously on atenolol.   ALLERGIES:  The patient has allergies to codeine and Diltiazem.   SOCIAL HISTORY:  The patient is an Acupuncturist.  His  Social History and Family History are otherwise noncontributory.   PHYSICAL EXAMINATION:  VITAL SIGNS:  Blood pressure is 110/60 with a heart  rate of 155.  He is afebrile, respirations 20.  GENERAL:  He is an obese white male in no apparent distress.  HEENT:  PERRLA, EOMI.  NECK:  No bruits, no JVD, 2+ carotid pulses, no lymphadenopathy.  CHEST:  Clear to auscultation bilaterally with no wheeze, rales, or rhonchi.  CARDIAC:  Irregular rate and rhythm with tachycardia.  He has a normal S1  and S2.  No S3 is noted.  ABDOMEN:  Obese, benign, nontender, nondistended, no hepatosplenomegaly.  EXTREMITIES:  No cyanosis, clubbing, or edema.  NEUROLOGIC:  Within normal limits.  LABORATORY DATA:  White count 5.8, hemoglobin 9.4, hematocrit 31.5,  platelets 124,000, MCV low at 58.  Sodium 140, potassium 3.7, chloride 110,  bicarb 24, BUN 15, creatinine 0.9, glucose 164.  INR 2.2, PTT 37.  CK 187,  MB 2.6.  Troponin-I 0.01.  Bilirubin 0.5, AST 27, ALT 26, albumin 3.6.  The  EKG shows rapid atrial fibrillation, no other ST or T wave changes.  Chest x-  ray is pending.   ASSESSMENT AND PLAN:  The patient is a 65 year old man who presents with  recurrent paroxysmal atrial fibrillation, now with a rhythm in the 150-160  range.  He previously had an allergic reaction to Cardizem during a recent  ER visit for atrial fibrillation.  We will admit the patient today for  further workup and rate and rhythm control.   Problem 1.  Atrial fibrillation.  The patient is currently therapeutic on  his Coumadin.  We will continue this for now although the dose will be  adjusted, and I will start IV amiodarone to control his rhythm.  The  patient, in the ER, did not respond to metoprolol, so I will begin 150 mg  bolus of amiodarone over 20-30 minutes and then 1 mg/min for six hours, and  then 0.5 mg for 18 hours.  I will continue serial  enzymes, and we will  review his stress test and echocardiograms.  If it is needed, repeat  echocardiogram will be performed during this admission.  We will follow his  telemetry and EKGs closely.   Problem 2.  Chronic dyspnea.  Continue current plan, no change in  medications.   Problem 3.  Microcytic anemia.  I will send off iron studies, and this will  need to be followed upon very carefully.  I will guaiac all of his stools,  and we will consider a colonoscopy once the patient is stable, possibly as  an outpatient.     Heloise Beecham, M.D. [facility]                Salvadore Farber, M.D. [facility]    DWM/MEDQ  D:  06/16/2002  T:  06/16/2002  Job:  [id number]

## 2010-12-10 NOTE — Discharge Summary (Signed)
NAME:  [name], [name]                         ACCOUNT NO.:  [id number]   MEDICAL RECORD NO.:  [id number]                   PATIENT TYPE:  INP   LOCATION:  2012                                 FACILITY:  [facility]   PHYSICIAN:  Salvatore Decent. Dorris Fetch, M.D.         DATE OF BIRTH:  [birth year]   DATE OF ADMISSION:  10/14/2002  DATE OF DISCHARGE:  10/23/2002                                 DISCHARGE SUMMARY   ADMISSION DIAGNOSIS:  Chest pain and shortness of breath.   PAST MEDICAL HISTORY:  1. Paroxysmal atrial fibrillation.  Coumadin therapy was stopped November     2003.  He was started on amiodarone at that time.  Echocardiogram in     November of 2003 revealed ejection fraction of 65%, trivial AI, mild AF.     Cardiac catheterization in December of 1993 revealed no significant     coronary artery disease.  Cardiolite stress test in March of 2003     revealed ejection fraction of 63%, no evidence of ischemia.  2. Mild COPD.  PFTs September 23, 2002.  3. Hypertension.  4. Obesity.  5. Chronic anemia and thrombocytopenia.  He had a GI evaluation that was     negative in November of 2003.  6. Hyperlipidemia.   ALLERGIES:  CODEINE.  DILTIAZEM causes a rash.   DISCHARGE DIAGNOSIS:  Severe two vessel coronary artery disease, status post  coronary artery bypass graft.   HISTORY OF PRESENT ILLNESS:  The patient is a 64 year old Caucasian male.  He has had a year-long history of chest pain and shortness of breath.  Also  has had two episodes of atrial fibrillation that have been controlled  medically.  Over the several weeks prior to admission he had progression of  the discomfort in his chest as well as shortness of breath with arm pain,  burning sensation in his chest, and shortness of breath now with minimal  exertion.  He sought medical attention with his cardiologist who ultimately  recommended cardiac catheterization.   HOSPITAL COURSE:  On October 14, 2002, he was admitted to [facility]  under the care of [facility] and underwent  cardiac catheterization.  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  Because of the complexity of his  lesions, cardiac surgery consult was requested.  He was evaluated by Salvatore Decent. Dorris Fetch, M.D.  After examination of the patient and review of the  available records, Dr. Dorris Fetch recommended proceeding with coronary  artery bypass graft surgery as preferred treatment choice for this  gentleman.  The procedure, risks and benefits of surgery were discussed with  the patient and he agreed to proceed with this plan.   On March 22, preoperative arterial evaluation revealed no significant  carotid artery disease.  His Doppler study on the  right arm revealed a  greater than 50% decrease with compression of radial.  However, the arterial  evaluation on the left was normal.  He was noted to have palpable pulses in  his bilateral lower extremities.   On October 15, 2002, the patient underwent the following surgical procedure by  Dr. Dorris Fetch.  Coronary artery bypass grafting x3.  Grafts placed at the  time of the procedure were left internal mammary artery grafted to the left  anterior descending artery, saphenous vein graft to the diagonal artery,  left radial artery was grafted to the obtuse marginal artery. Vein was  harvested from the right lower leg for one vein graft.  He tolerated this  procedure well and was transferred in stable condition to the SICU. He  remained hemodynamically stable in the immediate postoperative period and  was extubated after arrival in the ICU.   POSTOPERATIVE ISSUES:  Problem 1.  Atrial fibrillation.  On postoperative  day #2, atrial fibrillation with rate into the 120s.  This was not expected  since he had atrial fibrillation in the preoperative period.   Cardizem was  not utilized for rate control due to his allergy. Lopressor was utilized for  rate. He was also given additional amiodarone. Cardiology was asked to  consult regarding his heart rhythm.  Because of the recurrent nature of his  atrial fibrillation, anticoagulation therapy was initiated with Coumadin and  Lovenox.  The patient was symptomatic from his rapid atrial fibrillation.  Efforts were aimed at getting his rate into a more reasonable range. It was  felt that he will probably not maintain normal sinus rhythm and will  probably need long-term anticoagulation.  He did briefly return to a sinus  rhythm on postoperative day #6, however, he returned to rapid atrial  fibrillation the next day. Despite increasing doses of beta blockers as well  as digoxin and amiodarone, his rate was not controlled as of postoperative  day #7.  Verapamil was initiated on that day at 80 mg t.i.d. and he  converted to normal sinus rhythm at 1:30 in the afternoon. He maintained  normal sinus rhythm during the night and the next morning.   On October 23, 2002, postoperative day #8, the patient reports feeling fairly  well. His heart is in normal sinus rhythm.  The lungs are clear.  He is  waiting for his breakfast. He is experiencing no nausea this morning. He is  voiding adequately.  His incisions are all healing well and his right lower  leg has continued to have some edema. His pain is well controlled.  As he  has maintained normal sinus rhythm since yesterday afternoon and has  otherwise been doing well in recovering from surgery, he is discharged on  the morning of October 23, 2002.   LABORATORY DATA:  CBC on March 29; white blood cell count 8.1, hemoglobin  10.3, hematocrit 31.6, platelets 154.  March 29, chemistries included sodium  135, potassium 4.1, BUN 23, creatinine 1.0, glucose 110, calcium 8.8.  March  31, PT 23.8, INR 2.5.  CONDITION ON DISCHARGE:  Improved.   DISCHARGE MEDICATIONS:   1. Coumadin.  He has been instructed to start 2.5 mg daily on Thursday,     April 1, with no Coumadin today, March 31.  The [facility] will     instruct him on further dosing after his blood work.  2. Lopressor 50 mg 1-1/2 tablets b.i.d. at 8 a.m. and 8 p.m.  3. Verapamil 80  mg t.i.d. at 8 a.m., 3 p.m., and 8 p.m.  4. Digoxin 0.125 mg daily.  5. Pravachol 80 mg p.o. daily.  6. Amiodarone 200 mg b.i.d. (new dose for him) at 8 a.m. and 8 p.m.  7. Nexium 40 mg daily.  8. Multivitamin daily.  9. Vioxx 25 mg daily.  10.      For pain management he may have Tylox one to two p.o. q.4-6h as     needed.   ACTIVITY:  He has been instructed to refrain from any driving or heavy  lifting, pushing, or pulling. He should continue his breathing exercises and  daily walking.   DIET:  Low salt, low fat diet.   WOUND CARE:  He may shower with mild soap and water.  If his incisions are  red, hot, swollen, draining, or if he has a fever greater than 101 degrees  Fahrenheit, he is to call Dr. Sunday Corn office.   FOLLOW UP:  He has been asked to have PT and INR blood work drawn on Friday,  April 2, at the [facility].  He will need an  appointment with [facility].  He requests to see Dr.  Andee Lineman in the postoperative period in approximately two weeks. He will have  a chest x-ray taken on this day.  These two appointments at [facility] will be  arranged for him.  Dr. Dorris Fetch would like to see him at the [facility] office  on Tuesday, May 4, at 1:15.     Toribio Harbour, R.N.                  Salvatore Decent. Dorris Fetch, M.D.    CTK/MEDQ  D:  10/23/2002  T:  10/24/2002  Job:  [id number]   cc:   [name], M.D. [facility]  [address]  [address]  [place]  [place] [address]   [place]. Andrey Campanile, M.D.  [address]  [place]  [place] [address]  Fax: [phone]

## 2010-12-10 NOTE — Consult Note (Signed)
NAME:  [name], [name]                         ACCOUNT NO.:  [id number]   MEDICAL RECORD NO.:  [id number]                   PATIENT TYPE:  INP   LOCATION:  2399                                 FACILITY:  [facility]   PHYSICIAN:  Salvatore Decent. Dorris Fetch, M.D.         DATE OF BIRTH:  [birth year]   DATE OF CONSULTATION:  10/14/2002  DATE OF DISCHARGE:                                   CONSULTATION   REASON FOR CONSULTATION:  Severe two vessel coronary artery disease.   CHIEF COMPLAINT:  Chest pain and shortness of breath.   HISTORY OF PRESENT ILLNESS:  The patient is a 65 year old gentleman with a  history of tobacco abuse, obesity, hypertension, and paroxysmal atrial  fibrillation.  He has had chronic shortness of breath for the past year.  This has been accompanied by right arm discomfort and occasionally a burning  sensation in his chest.  The burning sensation in the chest is relatively  recent in onset. The shortness of breath has primarily been exertional.  Over the past several weeks, he has noted an increase in the frequency and  severity of the discomfort and it comes on with less exertion.  He  frequently has left arm pain as well when he exerts himself heavily.  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  His ejection fraction was 60%.  Because of the complex  nature of the LAD lesion and the aneurysm in the diagonal, the patient was  referred for consideration of bypass grafting.   PAST MEDICAL HISTORY:  Significant for hypertension, hyperlipidemia, tobacco  abuse, family history of coronary artery disease, history of paroxysmal  atrial fibrillation, none since November of 2003.  Anemia, thrombocytopenia.   MEDICATIONS:  1. Amiodarone 200 mg p.o. daily.  2. Vioxx 25 mg p.o.  daily.  3. Nexium 40 mg p.o. daily.  4. Baby aspirin one p.o. daily.  5. Toprol XL 50 mg p.o. daily.  6. Iron twice a day.  7. Multivitamin once daily.   ALLERGIES:  CODEINE, DILTIAZEM.   FAMILY HISTORY:  Significant for coronary artery disease, father has had  bypass surgery, mother had coronary artery disease and had a stent.   SOCIAL HISTORY:  He has a history of tobacco abuse.  He quit two years ago.  He smoked approximately one pack a day for 20 years prior to that.  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   REVIEW OF SYSTEMS:  He has chronic shortness of breath.  He has had anemia,  thrombocytopenia, fatigue, and weakness.  Decreased hearing right ear.  He  wears dentures and he wears glasses.  He has bone  spurs in his heels.  He  has had some degenerative changes in his spine. He does have some difficulty  with urination.   PHYSICAL EXAMINATION:  GENERAL:  The patient is a well-appearing 65 year old  white male in no acute distress.  VITAL SIGNS:  Blood pressure 145/78, pulse 72 and regular, respirations 18  and afebrile.  HEENT:  He has dentures, otherwise unremarkable.  NECK:  Supple, no thyromegaly, adenopathy, or bruits.  LUNGS:  Clear to auscultation and percussion.  He has distant breath sounds,  but no wheezing.  HEART:  Regular rate and rhythm with a 2/6 systolic murmur at the left upper  sternal border.  ABDOMEN:  Obese and protuberant, but nontender.  EXTREMITIES:  Without cyanosis, clubbing, or edema.  He has a normal Alan's  test on the left hand.  He has 2+ distal pulses throughout.  There are no  varicosities. His skin is warm, pink, and dry.   LABORATORY DATA:  PT is 12.3, INR is 0.9.  EKG shows normal sinus rhythm and  normal EKG.  Chest x-ray not done yet.  The remainder of labs are not  available on the chart.  Pulmonary function testing showed an FEV1 of 3.11  which was 87% of  predicted.   IMPRESSION:  The patient is a 65 year old gentleman who presents with  progressive class III angina. 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  Coronary  artery bypass grafting is the best option for revascularization given the  complex nature of his LAD and diagonal disease.  I had a long discussion  with the patient and his family regarding coronary artery disease. We also  discussed atrial fibrillation and possibility of a May's procedure.  He has  had minimal difficulty with this atrial fibrillation and given the 80%  success rate as well as increased operative time and potential for  complications, he wishes not to have a May's procedure done at this time. We  did discuss coronary artery bypass grafting including indications, risks,  benefits, and alternative to treatments.  He understands that the risks  include, but are not limited to, death, stroke, MI, DVT, PE, bleeding,  possible need for transfusions.  He is at an increased risk for bleeding and  transfusion due to his anemia and thrombocytopenia which are both mild.  Also risk of infection as well as other organ system dysfunction including  respiratory, hepatic, or GI complications.  He understands and accepts these  risks and agrees to proceed.  He and his family understand the perative  nature of CABG and that progression of disease is a possibility.  We  discussed the potential for arterial grafting to reduce his risk for redo  surgery in the future, at least to potentially reduce the risk.  Due to his  size and body habitus, I would not attempt bilateral mammary artery  grafting, though, he would be a good candidate for radial artery grafting,  but discussed that this may provide improved longevity versus saphenous vein graft.  He understands and accepts the risks of bypass  surgery as well as  radial artery harvest.  He agrees to proceed.  We will plan to proceed with  surgery tomorrow afternoon at the first available operative time.  Salvatore Decent Dorris Fetch, M.D.    SCH/MEDQ  D:  10/14/2002  T:  10/15/2002  Job:  [id number]   cc:   [name], M.D. [facility]  [address]  [address]  [place]  [place] [address]   Pricilla Riffle, M.D. [facility]. Andrey Campanile, M.D.  [address]  [place]  [place] [address]  Fax: [phone]   Charlaine Dalton. Sherene Sires, M.D. [facility]

## 2010-12-10 NOTE — Cardiovascular Report (Signed)
NAMETRENTEN, WATCHMAN               ACCOUNT NO.:  [id number]   MEDICAL RECORD NO.:  [id number]          PATIENT TYPE:  INP   LOCATION:  6527                         FACILITY:  [facility]   PHYSICIAN:  Arvilla Meres, M.D. LHCDATE OF BIRTH:  1946-04-16   DATE OF PROCEDURE:  01/19/2005  DATE OF DISCHARGE:                              CARDIAC CATHETERIZATION   PRIMARY CARE PHYSICIAN:  Duffy Rhody C. Andrey Campanile, M.D.   CARDIOLOGIST:  [name], M.D.   PROCEDURE:  Cardiac catheterization.   PATIENT IDENTIFICATION:  Mr. Eslick is a 65 year old male with multiple  cardiac risk factors and known CAD status post coronary artery bypass  grafting in 2004 by Dr. Andrey Spearman who was admitted with recurrent  unstable angina reminiscent of his prebypass angina.  He is ruled out for  myocardial infarction, and is brought to the catheterization lab for  coronary angiography.   DESCRIPTION OF PROCEDURE:  The risks and benefits of the catheterization  were explained to Mr. Mccarter; consent was signed and placed on the chart.  The 6 French sheath was placed in the right femoral artery using a modified  Seldinger technique. Standard catheters including a JL4, JR4 and bent  pigtail were used for all the angiography.  All catheter changes were made  over a wire.  At the end of the procedure, the patient had a sheath sewn in  and was given a 3000 unit bolus of heparin and 600 mg of Plavix in  anticipation of angioplasty.  There were no apparent complications.   FINDINGS:  1.  Central aortic pressure was 145/73 with a mean of 100.  2.  LV pressure was 153/1/24.  There was approximately a 10 mm gradient      across the aortic valve on pullback.   CORONARY ANGIOGRAPHY:  1.  The left main was normal.  2.  The LAD was a moderate size vessel.  It gave off a large first diagonal      as well as a very large branching first septal perforator.  In the mid      LAD, there was an approximately 50% stenosis at  worst prior to the      insertion of the LIMA.  There was focal 50-60% stenosis after the      insertion in the distal LAD.  3.  The circumflex was a large codominant system.  It gave off a tiny ramus      and a very large branching OM-I.  There was a small to moderate size OM-      II and a small PDA.  There was a 30% ostial stenosis in the left      circumflex followed by an 80% mid lesion prior to the take off of the OM-      I.  4.  The right coronary artery was a large dominant vessel. It gave off a      large PDA and two small PLs.  There was a 30% stenosis tubular in the      midportion.  5.  The LIMA to the LAD was widely  patent.  However, the runoff into the      distal LAD was poor given the strong competitive flow from the native      LAD.  There appeared to be a 30% tapering at the insertion of the LIMA      to the LAD.  However, this may have been artifactual due to a strong      competitive flow from the LAD.  6.  Radial artery to the OM-I:  There was an ostial 90-95% lesion with      catheter damping and dye hang-up throught the graft.  This did not      change with nitroglycerin.  The rest of the graft was intact, and it      filled a very large OM system and back filled the entire left circumflex      system.  Just prior to the insertion of the graft there was a 50-60%      lesion in the OM-I proper.  7.  The saphenous vein graft to the first diagonal was widely patent.  In      the native diagonal, there was a 60-70% stenosis just prior to the      insertion of the graft and a 50% stenosis after the insertion of the      graft at a bend in the vessel.  8.  The left ventricular was not done secondary to the fact that the patient      had already received almost 200 mL of contrast.   ASSESSMENT AND PLAN:  1.  Significant two vessel native coronary disease.  2.  High grade stenosis in the ostial radial artery to the first obtuse      marginal graft, consistent with  severe graft vessel disease.  3.  Left internal mammary artery to the left anterior descending is wildly      patent with mild tapering of the anastomosis but there is brisk      competitive flow from the proximal LAD.  There is no significant      stenosis within the graft.  4.  Saphenous vein graft to the first diagonal is patent.   DISCUSSION:  We will plan for percutaneous intervention of the radial artery  to OM-I graft vs the native cicumflex with Dr. Samule Ohm today.       DB/MEDQ  D:  01/19/2005  T:  01/19/2005  Job:  [id number]

## 2010-12-10 NOTE — Assessment & Plan Note (Signed)
[name]                            [facility] OFFICE NOTE   [name], [name]                      MRN:          [id number]  DATE:03/17/2006                            DOB:          [birth year]    REASON FOR OFFICE VISIT:  [name] is a 65 year old male with known  coronary artery disease, status post CABG with most recent cardiac  catheterization in April of this year, revealing a patent bypass graft,  normal left ventricular function, and no evidence of pulmonary hypertension.  He now presents for scheduled office followup.   Since being seen here in the office for postcatheterization followup, the  patient has gained approximately 32 pounds over the last 3 months.  His main  complaint continues to be that of significant exertional dyspnea with  minimal/moderate activity.  However, he has no frank PND but has chronic  orthopnea.  He also has chronic lower extremity edema with perhaps some  recent exacerbation.  Of note, his weight has increased 13 pounds since his  last office visit.   The patient also has history of paroxysmal atrial fibrillation, and is on  chronic Coumadin.  He has occasional breakthrough episodes of PAF, given  that he is exquisitely sensitive to this.  They have been occurring  approximately once a month over these past several months.  He typically can  feel them before he goes to bed, and they are resolved when he awakens the  next morning.   The patient denies any exertional chest discomfort, but does have  significant exertional dyspnea.   The patient was recently referred for a CT scan of the chest/abdomen, given  his significant weight gain.  The CT of the abdomen notable only for a trace  right pleural fluid and fatty liver.  CT scan of the chest suggested  prominent mediastinal/right hilar nodes - followup CT in 3 months was  recommended.   Electrocardiogram today reveals normal sinus rhythm at  61BMP, with first  degree atrial ventricular block.   CURRENT MEDICATIONS:  1. Plavix 75 mg daily.  2. Coated aspirin 81 mg daily.  3. Coumadin 5 mg as directed.  4. Potassium chloride 20 mEq b.i.d.  5. Digoxin 0.125 mg daily.  6. Pravachol 80 mg q.h.s.  7. Omeprazole 40 mg b.i.d.  8. Metoprolol 50 mg b.i.d.  9. Lasix 80 mg daily.  10.Metformin 750 mg daily.  11.Ferrous sulfate.  12.Fish oil 1000 daily.  13.Verapamil 40 mg t.i.d.  14.Amiodarone 200 mg daily.  15.Diclofenac 75 mg daily.  16.Lantus insulin as directed.   PHYSICAL EXAMINATION:  VITAL SIGNS:  Blood pressure 150/70, pulse 61,  regular, weight 285 (up 13 pounds).  GENERAL:  65 year old male, sitting upright, in no apparent distress.  NECK:  Palpable bilateral carotid pulses without bruits.  LUNGS:  Clear to auscultation bilaterally.  HEART:  Regular rate and rhythm (S1, S2), grade 2/6 systolic ejection murmur  in the upper LSB.  ABDOMEN:  Markedly protuberant, intact bowel sounds.  EXTREMITIES:  Palpable distal pulses with 1-2+ lower extremity edema, with  venous varicosity and  stasis dermatitis changes.   IMPRESSION:  1. Chronic exertional dyspnea.      a.     Suspect multifactorial etiology.  2. Coronary artery disease.      a.     Status post coronary artery bypass graft 2004:  Patent bypass       graft; no pulmonary hypertension; preserved left ventricular function       by cardiac catheterization April 2007.      b.     Status post Cypher proximal circumflex coronary artery June       2006.  3. History of paroxysmal atrial fibrillation.      a.     Currently maintaining normal sinus rhythm on amiodarone.      b.     Chronic Coumadin.  4. Insulin-requiring diabetes mellitus.  5. Hypertension.  6. Hyperlipidemia.  7. Chronic obstructive pulmonary disease.  8. Probable obstructive sleep apnea.  9. Obesity.  10.Deconditioning.   PLAN:  1. Schedule PFTs with [facility] to exclude a significant pulmonary  disease.  2. Arrange overnight pulse oximetry for screening of a possible      obstructive sleep apnea.  3. Diagnosis Plavix - the patient is now more than 1 year out since      undergoing placement of a drug-eluting stent.  4. The patient strongly urged to initiate a regular exercise program for      weight loss and      conditioning.  5. Schedule a return clinic followup with Dr. Andee Lineman in 3 months.                                   Gene Serpe, PA-C                                [name], MD, [name]   GS/MedQ  DD:  03/17/2006  DT:  03/18/2006  Job #:  [id number]

## 2010-12-10 NOTE — Discharge Summary (Signed)
NAME:  [name], [name]                         ACCOUNT NO.:  [id number]   MEDICAL RECORD NO.:  [id number]                   PATIENT TYPE:  INP   LOCATION:  2037                                 FACILITY:  [facility]   PHYSICIAN:  [name], P.A. [facility]            DATE OF BIRTH:  March 12, 1946   DATE OF ADMISSION:  06/15/2002  DATE OF DISCHARGE:  06/21/2002                                 DISCHARGE SUMMARY   HISTORY OF PRESENT ILLNESS:  This is a 65 year old man followed by [name] with [facility], who was recently seen at [facility]  emergency room on May 15, 2002, for atrial fibrillation with rapid  ventricular response. The patient was given Cardizem, however, he had an  allergic reaction to this medication. He converted to normal sinus rhythm  and was continued on a beta blocker. On the evening of June 15, 2002,  The patient again developed atrial fibrillation and presented to the  emergency room where  he was  admitted by [name]. He was  given IV  Metoprolol x 2 without significant slowing of his heart rate.   PAST MEDICAL HISTORY:  1. The patient has chronic dyspnea.  2. He has paroxysmal atrial fibrillation and has been on Coumadin therapy     prior to  admission.  3. He reported two recent negative stress tests and a recent normal     echocardiogram.  4. He is overweight.   ALLERGIES:  1. CODEINE.  2. DILTIAZEM.   SOCIAL HISTORY:  The patient is employed by [employer]. He is  married.   HOSPITAL COURSE:  As noted this patient was admitted to [facility]  with atrial fibrillation with rapid ventricular response. This was  unresponsive to IV Metoprolol and he was therefore given amiodarone. His INR  on admission was 2.2 and the Coumadin dose was initially decrease secondary  to amiodarone therapy.   The patient was noted to be anemic and his Coumadin was held. A GI consult  was obtained. The patient eventually underwent  upper endoscopy as well as  endoscopy and a small bowel follow through, which did not reveal any source  of anemia. Stool Guaiacs were negative. Further followup evaluation on an  outpatient basis is planned.   The patient had a 2D echo during his stay which was performed on June 18, 2002. This revealed an ejection fraction  of 65%. There was trivial  aortic valvular regurgitation. The RV size and function appeared normal.  There was mild aortic stenosis.   The patient did convert from atrial fibrillation to  normal sinus rhythm and  his amiodarone dose was adjusted and arrangements were eventually made to  discharge the patient on June 21, 2002, in improved and stable  condition.   LABORATORY DATA:  Please [name] results of 2D echo as noted above.   Stool for blood was negative.  On June 19, 2002, hemoglobin was 9.1,  hematocrit 31.1, WBCs 5.8000, platelets 141,000. An INR on the 26th was 1.4.  Chemistries on the 26th revealed a BUN of 12, creatinine 1.0, potassium 4.2,  hemoglobin A1C 5.4, cardiac enzymes were negative. A lipid profile revealed  cholesterol 145, triglycerides 111, HDL 44, LDL 79. Iron studies revealed  iron to be low at 18, total iron binding capacity was high at 448, percent  saturation was low at 4, ferritin was low at 3. TSH was 2.403. A CRP was  5.950.   DISCHARGE MEDICATIONS:  1. Amiodarone 200 mg  q.d.  2. Toprol XL 25 mg  q.d.  3. Nexium 20 mg q.d.  4. Paxil as previously taken.  5. Vioxx as needed.  6. Iron 325 mg b.i.d.  7. Multivitamin 1 q.d.  8. Enteric coated aspirin 81 mg q.d.  9. The patient was told  to take Tylenol p.r.n. pain.   DISCHARGE INSTRUCTIONS:  His activity was to be as tolerated. He was told he  could return to work Monday, July 01, 2002. He was to be on a low salt,  low fat diet.   FOLLOW UP:  He was to follow up with [name] as needed. [name] on July 01, 2002, at  4 p.m., [name] Wednesday,  July 31, 2002,  at 2 p.m.   DISCHARGE DIAGNOSES:  1. Atrial fibrillation with rapid ventricular response, unresponsive to beta     blocker, treated with amiodarone.  2. A 2D echo performed this admission, please [name] results as noted above     with normal ejection fraction.  3. Chronic anemia of uncertain etiology with GI workup this admission.     Further workup on an outpatient basis.  4. Thrombocytopenia.  5. Chronic dyspnea.  6. Mild aortic stenosis by echocardiogram. Recommend followup echo  in three     years and spontaneous bacterial endocarditis prophylaxis.  7.     Coumadin held secondary to anemia. Decision to resume to be made on an     outpatient basis as noted. A GI workup negative to this point and stool     Guaiacs also found to be negative.  8. Allergy to diltiazem.                                               [name], P.A. [facility]    DR/MEDQ  D:  06/21/2002  T:  06/21/2002  Job:  [id number]   cc:   Matthew Haven. Andrey Campanile, M.D.  [address]  [place]  [place] [address]  Fax: [phone]   Matthew Hair. Arlyce [name], M.D. [facility]  [address]  [place]  [place] [address]  Fax: [phone]

## 2010-12-10 NOTE — Cardiovascular Report (Signed)
[name], KRAEMER               ACCOUNT NO.:  [id number]   MEDICAL RECORD NO.:  [id number]          PATIENT TYPE:  OIB   LOCATION:  1961                         FACILITY:  [facility]   PHYSICIAN:  Charlton Haws, M.D.     DATE OF BIRTH:  [birth year]   DATE OF PROCEDURE:  DATE OF DISCHARGE:                              CARDIAC CATHETERIZATION   PROCEDURE:  Coronary arteriography   INDICATIONS:  Mr. Gaddie is a 59-year patient of Dr. Andrey Campanile and Dr. Andee Lineman.  He is status post coronary bypass grafting by Dr. Dorris Fetch in 2004.  He  has been having increasing dyspnea.  There is also question of claudication.  Right and left heart catheterization was done including graft arteriography  to assess his claudication and dyspnea.   The left main coronary artery had a 30% tubular stenosis.   Left anterior descending artery had a high-grade stenosis after the takeoff  of the first diagonal branch.  There was competitive flow in the diagonal  branch and the LAD.  The circumflex coronary artery was somewhat large.  There was 30% multi __________ lesions in the proximal mid vessel.  The  first obtuse marginal branch was difficult to assess because of competitive  flow.  The distal circumflex was normal.   Right coronary artery was on bypass.  There was 30% multiple discrete  lesions in the mid vessel.  The distal vessel was small.   The saphenous vein graft to the diagonal branch was widely patent.  There  was a stent just after the distal anastomotic site which was also widely  patent.  Left internal mammary artery of the LAD was widely patent.   The saphenous vein graft to the obtuse marginal branch is widely patent.   RAO ventriculography:  RAO ventriculography was normal.  The EF was 65%.  There was no grade across the aortic valve and no MR.   Right heart catheterization showed a mean right atrial pressure of 8, RV  pressure was 31/3, PA pressure was 30/14, mean pulmonary capillary wedge  pressure was 16, LV pressure was 102/5, aortic pressure was 110/60.   The patient appeared to be in atrial fibrillation during the case, although  there was an approximately 8 mm difference between wedge and LVEDP.  I  suspect that if the patient had an atrial kick the post A-wave EDP would be  similar and I did not think there is any evidence of severe pulmonary vein  problems.   Distal aortogram was done.  There was no aneurysm.  The renal arteries were  somewhat poorly visualized but did not appear to be stenotic.  There did not  appear to be any significant aortic constriction.   IMPRESSION:  The heart catheterization shows patent grafts, good left  ventricular function with an ejection fraction of 65%, and no evidence of  pulmonary hypertension.  There is also no evidence of significant aortic  disease.   I suspect that a lot of the patient's dyspnea has to do with his atrial  arrhythmias and deconditioning.   He tolerated the procedure well.  He will  follow up with Dr. Andee Lineman in [place].           ______________________________  Charlton Haws, M.D.     PN/MEDQ  D:  11/01/2005  T:  11/01/2005  Job:  [id number]   cc:   [name], M.D. [facility]  [address]  [address]  [place]  [place] [address]   Vale Haven. Andrey Campanile, M.D.  Fax: [phone]

## 2010-12-10 NOTE — Op Note (Signed)
NAME:  [name], [name]                         ACCOUNT NO.:  [id number]   MEDICAL RECORD NO.:  [id number]                   PATIENT TYPE:  INP   LOCATION:  2301                                 FACILITY:  [facility]   PHYSICIAN:  Salvatore Decent. Dorris Fetch, M.D.         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   POSTOPERATIVE DIAGNOSIS:  Severe two-vessel coronary artery disease with  progressive angina.   OPERATION/PROCEDURE:  Median sternotomy, extracorporeal circulation,  coronary artery bypass grafting x3 (left internal mammary artery to left  anterior descending, left radial artery to obtuse marginal-1, saphenous vein  graft to first diagonal).   SURGEON:  Salvatore Decent. Dorris Fetch, M.D.   ASSISTANTSalvatore Decent. Cornelius Moras, M.D.  Toribio Harbour, R.N.   ANESTHESIA:  General.   FINDINGS:  Good quality targets, good quality conduits.  Left ventricular  hypertrophy.  Difficult exposure of circumflex because of the patient's body  habitus and left ventricular hypertrophy.   CLINICAL NOTE:  [name] is a 65 year old white male who has had about a  year long history of chest pain and shortness of breath.  He has also had  two episodes of atrial fibrillation reasonably controlled medically as well.  Over the past several weeks, he has had a progression of his discomfort,  with arm pain, burning sensation in his chest and shortness of breath now  with minimal exertion.  He underwent cardiac catheterization, which revealed  severe two-vessel disease with a 99% stenosis in his left circumflex and a  90% LAD stenosis at the bifurcation of the large diagonal branch with  aneurysmal disease in the diagonal.  The patient was advised to undergo  coronary artery bypass grafting for revascularization.  The  indications,  risks, benefits and alternatives were discussed in detail with the patient.  He understood and accepted the risks and agreed to proceed.  Use of left  radial artery for potential long term benefit versus saphenous vein graft  was discussed.  The patient was not felt to be a good candidate for  bilateral mammaries because of his body habitus.  The patient understood the  additional risk of radial artery harvest, although this was minimal with a  normal Allen test and agreed to that as well.  The option of a Maze  procedure for atrial fibrillation was discussed as well.  He did not wish to  have that procedure added.   DESCRIPTION OF PROCEDURE:  [name] was brought to the preoperative  holding area on October 15, 2002.  Lines were placed to monitor arterial,  central venous and pulmonary arterial pressure.  The patient was given  intravenous antibiotics.  He was taken to the operating room, anesthetized  and intubated.  A Foley catheter was placed.  The chest, abdomen, legs, and  left arm were prepped and draped in the usual fashion.   A median sternotomy was performed, and the left internal mammary artery was  harvested using a standard technique.  Branches were doubly clipped and  divided.  The radial artery was harvested simultaneously.  This was  performed by Dr. Cornelius Moras after doing a limited incision and limited dissection  to confirm the preoperative finding of a normal Allen test.  A soft bulldog  clamp was placed across the radial artery, and there was excellent pulse  distally.  The incision then was extended the length of the forearm and the  radial artery was harvested using the standard technique with the harmonic  scalpel.  Radial artery was excellent quality graft.   Also simultaneously incision was made in the medial aspect of the right leg  and the greater saphenous vein was harvested using the standard technique.  This was harvested from the ankle.  It was  relatively small in caliber but  of good quality with no significant branches and no varicosities.  The  patient was given full-dose heparinization.  The distal end of the mammary  artery was divided.  There was excellent flow through the cut end of the  vessel.  The mammary was placed in papaverine-soaked sponge and placed into  the left pleural space.  The radial artery was divided.  There was excellent  flow through this vessel and good backbleeding from the distal stump.  Both  the proximal and distal stumps were suture ligated.  The wound was  irrigated.  Hemostasis was achieved, and the arm was closed in the standard  fashion with subcutaneous running suture and subcuticular skin closure.   The pericardium was opened.  The ascending aorta was inspected and palpated.  There was no palpable atherosclerotic disease.  The aorta was cannulated via  concentric 2-0 Ethibond pledgeted pursestring sutures.  A dual-stage venous  cannula was placed via pursestring sutures in the right atrial appendage.  Cardiopulmonary bypass was instituted, and the patient was cooled to 32  degrees C.  The coronary arteries were inspected and anastomotic sites were  chosen.  The conduits were inspected and cut to length.  A foam pad was  placed in the pericardium to protect the left phrenic nerve.  Temperature  probe was placed in the myocardial septum and a cardioplegia cannula was  placed in the ascending aorta.   The aorta was crossclamped.  The left ventricle was emptied via the aortic  root vent.  Cardiac arrest was achieved with a combination of cold antegrade  blood cardioplegia and topical iced saline.  After achieving a complete  diastolic arrest, and myocardial septum temperature of 10 degrees C, the  following distal anastomoses were performed.   First, the reverse saphenous vein graft was placed end-to-side to the first  diagonal branch to the LAD.  This was a 1.5 mm good quality  anterolateral vessel.  The vein was of small caliber but good quality and the anastomosis  was performed with a running 7-0 Prolene suture.  There was excellent flow  through the graft.  Cardioplegia was administered.  There was good  hemostasis.   Next, the left radial artery was spatulated at its distal end and was  anastomosed end-to-side to the second obtuse marginal.  This was the  dominant lateral branch that arose just after a near total occlusion of the  left circumflex coronary.  The OM was a 1.5 mm  good quality target. The  radial was a good quality graft.  The anastomosis was performed with a  running 8-0 Prolene suture.  At the completion of the anastomosis, the graft  was flushed with cold heparinized saline.  There was excellent flow through  the graft and good hemostasis.  The pedicle was tacked to the epicardial  surface of the heart with a 6-0 Prolene suture.   Next, the left internal mammary artery was brought through a window in the  pericardium.  The distal end of the mammary was spatulated and was  anastomosed end-to-side to the distal LAD.  Both the mammary and LAD were  good quality vessels.  The LAD was a 1.5 mm target. The anastomosis was  performed with a running 8-0 Prolene suture.  At the completion of the  mammary to LAD anastomosis, the bulldog clamp was briefly removed from the  mammary artery.  Immediate and rapid septal warming was noted.  The bulldog  clamp was replaced.  The mammary pedicle was tacked to the epicardial  surface of the heart with 6-0 Prolene sutures.   Additional cardioplegia was administered.  The vein graft and radial artery  graft were cut to length.  The cardioplegia cannula was removed from the  ascending aorta, and the proximal anastomoses were performed to 4 mm punch  aortotomies.  The proximal anastomosis for the radial artery was performed  first with a running 7-0 Prolene suture.  Next, the proximal saphenous vein   anastomosis was performed with a running 6-0 Prolene suture.  At the  completion of this anastomosis before tying the suture, the bulldog clamp  was once again removed from the mammary artery. Again, immediate and rapid  septal warming was noted.  Lidocaine was administered.  The aortic root was  de-aired.  The patient was placed in Trendelenburg position and the aortic  cross-clamp was removed.  Total cross-clamp time was 66 minutes.  The  patient was rewarmed.   The patient initially fibrillated but converted spontaneously and then was  bradycardic.  All proximal and distal anastomoses were inspected for  hemostasis.  Epicardial pacing wires were placed on the right ventricle and  right atrium.  When the patient had been rewarmed to a core temperature of  37 degrees C, a low-dose dopamine drip was initiated.  The patient dual-  chamber paced because of his slow rate.  He then weaned from cardiopulmonary  bypass without difficulty.  The total bypass time was 100 minutes.  Initial cardiac index was greater than three liters per minute per meter squared,  and the patient remained hemodynamically stable throughout the post bypass  period.   A test dose of protamine was administered and was well tolerated.  The  atrial and aortic cannulae were removed.  The remainder of the protamine was  administered without incident.  The chest was irrigated with one liter of  warm normal saline containing 1 g of vancomycin.  Hemostasis achieved.  A  left pleural and two mediastinal chest tubes were placed through separate  subcostal incisions.  The pericardium was reapproximated with interrupted 3-  0 silk sutures that came together easily without tension.  The sternum was  closed with heavy-gauge interrupted stainless steel wires.  The pectoralis  fascia was closed with a running #1 Vicryl suture.  The subcutaneous tissue  was closed with a running 2-0 Vicryl and the skin was closed with a 3-0  Vicryl  subcuticular suture.  All sponge, needle and  instrument counts were  correct at the end of the procedure.  The patient was taken from the  operating room to the surgical intensive care unit, intubated and in  critical but stable condition.                                                Salvatore Decent Dorris Fetch, M.D.    SCH/MEDQ  D:  10/15/2002  T:  10/16/2002  Job:  [id number]   cc:   [name], M.D. [facility]  [address]  [address]  [place]  [place] [address]   Pricilla Riffle, M.D. [facility]. Andrey Campanile, M.D.  [address]  [place]  [place] [address]  Fax: [phone]

## 2010-12-10 NOTE — Cardiovascular Report (Signed)
NAME:  [name], [name]                         ACCOUNT NO.:  [id number]   MEDICAL RECORD NO.:  [id number]                   PATIENT TYPE:  INP   LOCATION:  2399                                 FACILITY:  [facility]   PHYSICIAN:  [name], M.D. [facility]             DATE OF BIRTH:  10/27/1945   DATE OF PROCEDURE:  10/14/2002  DATE OF DISCHARGE:                              CARDIAC CATHETERIZATION   REFERRING PHYSICIAN:  Duffy Rhody C. Andrey Campanile, M.D.   CARDIOLOGIST:  Pricilla Riffle, M.D.   PROCEDURES:  1. Left and right heart catheterization.  2. Ventriculography.  3. Selective coronary angiography.   DIAGNOSES:  1. Severe two-vessel coronary artery disease.  2. Normal ejection fraction.  3. No evidence for significant pulmonary hypertension.   INDICATIONS:  The patient is a 65 year old white male with a history of long-  standing shortness of breath.  The patient had a prior admission for atrial  fibrillation.  He has also been diagnosed with anemia secondary to iron-  deficiency anemia.  The patient has been treated with amiodarone for his  atrial fibrillation.  He also has hypertension and is on medical therapy for  hypertensive heart disease.  Due to continued shortness of breath and  decreased exercise tolerance despite two negative stress tests, the patient  has been referred for cardiac catheterization to assess his coronary  anatomy.   DESCRIPTION OF PROCEDURE:  After informed consent was obtained, the patient  was brought to the catheterization laboratory.  The right groin was  sterilely prepped and draped.  Lidocaine 1% was infiltrated.  A 6 French  arterial sheath was placed using modified Seldinger technique.  A 6 [place] and JR4 catheter were used to engage the left and right coronary ostia,  respectively.  A six French angled pigtail catheter was used for  ventriculography.  Angiography was performed using manual injection, and  ventriculography was performed using  power injection.  At the termination of  the procedure, all catheters and sheaths were removed.  The patient was  brought back to the holding area, and no complications were encountered.   FINDINGS:  HEMODYNAMICS:  Right atrial pressure 13/11, RV 29/2, PA pressure 29/10,  pulmonary capillary wedge pressure 10, left ventricular pressure 140/4,  aortic pressure 130/87 mmHg.  Cardiac output by thermodilution 6.5.  Cardiac  index by thermodilution 2.7.  Output by Fick 4.5 and index 1.9 L/min.  Aortic valve area was 1.72 sq. cm.   VENTRICULOGRAPHY:  Ejection fraction 60%.  No significant wall motion  abnormalities.   SELECTIVE CORONARY ANGIOGRAPHY:  1. Left main coronary artery was free of flow-limiting disease.  2. The left anterior descending artery was a very long vessel, moderate in     size.  The proximal to midsegment was difficult to lay out, but it did     appear on the LAO view that there was a 90% stenosis right  after the     takeoff of the first diagonal branch.  This finding was confirmed on     several frames.  In addition, the first diagonal had a 70% proximal     lesion with an aneurysmal segment.  3. The circumflex coronary artery had a high-grade 99% stenosis in the     midsegment, which was long and diffuse.  The obtuse marginal branch was     free of flow-limiting disease, but the second obtuse marginal branch had     approximately 30% stenosis.  4. The right coronary artery was diffusely diseased in its midsegment,     approximately 30-40%, but there was no other evidence of significant flow-     limiting lesions.   RECOMMENDATIONS:  Angiographic images were carefully reviewed with Dr.  Chales Abrahams.  The patient was offered the option of coronary artery bypass  grafting versus multivessel PCI.  This was also discussed with Dr. Tenny Craw.  Ultimately the patient decided to proceed with consultation from [facility], as he  is leaning toward bypass surgery.  He would prefer complete   revascularization with a durable result.                                                  [name], M.D. [facility]    GED/MEDQ  D:  10/14/2002  T:  10/15/2002  Job:  [id number]   cc:   Vale Haven. Andrey Campanile, M.D.  [address]  [place]  [place] [address]  Fax: [phone]   Pricilla Riffle, M.D. [facility]

## 2010-12-10 NOTE — Consult Note (Signed)
NAME:  [name], [name] NO.:  [id number]   MEDICAL RECORD NO.:  [id number]                   PATIENT TYPE:  EMS   LOCATION:  MAJO                                 FACILITY:  [facility]   PHYSICIAN:  Charlton Haws, MD [facility]                DATE OF BIRTH:  [birth year]   DATE OF CONSULTATION:  05/15/2002  DATE OF DISCHARGE:                                   CONSULTATION   HISTORY OF PRESENT ILLNESS:  The patient is a 65 year old patient of Dr.  Pricilla Riffle.  He has a history of PAF; he is on chronic Coumadin, however,  his episodes are fairly infrequent.  He says his last episode was probably  about a year ago, however, he has had two recent stress tests in the past  year which were negative and had an echocardiogram which he says was normal.  He has chronic dyspnea and has been seen by Dr. Casimiro Needle B. Wert and told  that he did not have a critical lung condition.   The patient has been somewhat stressed recently, as he has a new grandson  that was born in the last two weeks.   He does have some occupational exposures at his work at [employer].   He presented to the ER with rapid atrial fibrillation.   His rates were in the 150 to 160 range.  He received multiple medications in  the ER.  He had a probable allergic reaction to IV Cardizem with some  increased shortness of breath and itching at his IV site; this was treated  with Benadryl.  He subsequently had some hypotension, treated with fluid.   With his rapid heart rate, he did have some chest pain which resolved when  his rate was well-controlled.  After talking with Dr. Devoria Albe, I suggested  giving the patient some IV amiodarone and IV Lopressor; he subsequently  converted to normal sinus rhythm and is currently asymptomatic.   The patient's review of systems is remarkable for chronic dyspnea.  As far  as I can tell, he has not had a workup for sleep apnea, but I do not have  Dr. Thurston Hole  records.  There is no history of PE.  He is on chronic Coumadin  for over the last six months and sees Shelby Dubin in our Coumadin clinic;  his INR has been therapeutic, he says.   Again, there is no history of coronary disease and he has had a previously  normal stress test.   There have been no bleeding complications from his Coumadin and no history  of TIA or CVA.   MEDICATIONS:  His medications are unclear; I believe he takes:  1. Altace 5 mg a day.  2. Atenolol 25 mg a day.  3. Coumadin as directed by our clinic.   ALLERGIES:  He may be allergic to CODEINE; he had some  significant headache  and has not taken it in many years.  Again, there may have been an allergic  reaction to CARDIZEM here in the ER.   PHYSICAL EXAMINATION:  VITAL SIGNS:  Exam is remarkable for a blood pressure  of 110/70.  Pulse is apparently sinus bradycardic at 65.  LUNGS:  Lungs are clear.  CARDIAC:  Carotids normal.  There is an S1 and S2 with distant heart sounds.  ABDOMEN:  Abdomen is benign and protuberant.  EXTREMITIES:  Distal pulses are intact with no edema.   LABORATORY AND ACCESSORY CLINICAL DATA:  EKG currently is totally normal, a  sinus rhythm at a rate of 62.   Enzymes are negative.  INR was 2.2.   IMPRESSION:  I think the patient should be observed in the emergency room  for another two to three hours to make sure he does not have any recurrence  of his atrial fibrillation and to further make sure that any possible  allergic reaction to the Cardizem has passed.  He will be given a little bit  more normal saline.   Since his episodes are infrequent and his resting heart rate is slow, I  would not change his current medicines; in particular, I would not add  antiarrhythmic therapy or increase his beta blocker at this time.  I told  him he could be excused from work today.  He will continue to follow up with  Dr. Tenny Craw in the office as well as Shelby Dubin in the Coumadin clinic.                                                Charlton Haws, MD [facility]    PN/MEDQ  D:  05/15/2002  T:  05/15/2002  Job:  [id number]

## 2010-12-10 NOTE — H&P (Signed)
[name], [name]               ACCOUNT NO.:  [id number]   MEDICAL RECORD NO.:  [id number]          PATIENT TYPE:  INP   LOCATION:  1824                         FACILITY:  [facility]   PHYSICIAN:  [name], M.D. LHCDATE OF BIRTH:  1945/08/31   DATE OF ADMISSION:  01/17/2005  DATE OF DISCHARGE:                                HISTORY & PHYSICAL   PRIMARY CARE PHYSICIAN:  Dr. Andrey Campanile.   REASON FOR ADMISSION:  Evaluation of substernal chest pain in a 65 year old  male with known coronary artery disease status post coronary artery bypass  grafting.   HISTORY OF PRESENT ILLNESS:  Patient is a 65 year old white male with a  history of coronary artery disease status post coronary artery bypass  grafting in 2004 by Dr. Dorris [name].  The patient has a LIMA graft to the  LAD, a saphenous vein graft to diagonal and a left radial artery was grafted  to obtuse marginal artery.  The patient had normal perioperative LV  function.  The patient has a prior history of paroxysmal atrial fibrillation  noted to be present prior to his surgery.  The postoperative course was  complicated by recurrent atrial fibrillation requiring the use of  amiodarone.  The patient has also been on chronic anticoagulation therapy.  The patient has been followed in clinic and over the last several years has  been doing well.  His last visit was June 25, 2004, when he reported no  shortness of breath although he had some deconditioning secondary to his  weight gain.  He was also diagnosed at that time with diabetes mellitus by  Dr. Andrey Campanile.  He did report some atypical chest pain which was felt to be  secondary to sternotomy scar.   The patient is now admitted after several day history of intermittent chest  tightness similar in its nature to his symptoms pre-CABG.  He stated that on  Sunday when working in the yard, he had intermittent 4/10 substernal chest  pressure which worsened with exertion.  He also had pain  most of today and  yesterday which was partially relieved with Alka-Seltzer. He also feels his  dyspnea on exertion has worsened some more recently, albeit this has been  somewhat of a chronic problem.  He denies any diaphoresis, palpitations or  syncope.  Today he saw Dr. Andrey Campanile in the office and made him aware of his  symptoms and he was then sent to the emergency room.  On admission, his  electrocardiogram shows nonspecific T-wave inversions in the inferior leads  but no acute ST segment elevation.  Troponin markers are currently still  pending.   ALLERGIES:  1.  CODEINE causes chest pain.  2.  CARDIZEM causes itching.   MEDICATIONS:  Glipizide, Spectravite, Verapamil, metoprolol, Warfarin,  Pravachol, digoxin, Nexium, fish oil, potassium tablets, Lasix and  amiodarone 100 mg p.o. daily.  We are not certain about the patient's drug  dosing and the list will be provided by his wife later this morning.   PAST MEDICAL HISTORY:  1.  Coronary artery bypass grafting 2004 with LIMA to LAD, saphenous  vein      graft to diagonal I and radial to obtuse marginal branch.  2.  Diabetes mellitus diagnosed approximately one year ago.  3.  Hypertension.  4.  Dyslipidemia.  5.  Family history of coronary artery disease.  6.  Tobacco use.  7.  Obesity.  8.  COPD/emphysema.  9.  History of preoperative and postoperative atrial fibrillation on      anticoagulation and antiarrhythmic therapy.  10. History of hemorrhoids.  11. Carpal tunnel surgery.  12. History of lipoma surgery.   SOCIAL HISTORY:  Patient lives at [place], [place], with his  wife.  He quit smoking in 2001.  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  He  has a poor diet.   FAMILY HISTORY:  Notable for mother is alive at age 55 with coronary artery  disease.  Father alive at age 35 with coronary artery disease.  He also has  a younger sister with coronary artery disease.   REVIEW OF SYMPTOMS:  Negative for fever, chills  and sweats.  Negative for  headache and sore throat.  Negative for rash or lesions. Positive for chest  pain, shortness of breath, dyspnea on exertion, orthopnea but no PND.  Also  reports lower extremity edema.  No claudication.  No frequency or dysuria.  No weakness or numbness.  Positive for arthralgias.  No nausea or vomiting.  Positive for reflux symptoms.  No polyuria or polydipsia.  The remainder of  the review of systems is negative.   PHYSICAL EXAMINATION:  GENERAL APPEARANCE:  An obese white male but in no  apparent distress.  VITAL SIGNS:  Blood pressure is 150/75, heart rate 58 beats per minute,  saturations 90% on two liters.  HEENT:  Pupils isochoric.  Conjunctivae clear.  NECK:  Supple bilaterally with carotid bruits, more so on the right than on  the left.  Cannot rule out referred murmur.  LUNGS:  Clear bilaterally, diminished at the bases.  CARDIOVASCULAR:  Regular rate and rhythm with normal S1 and S2.  No definite  S3 or S4.  There is a 2/6 crescendo, decrescendo murmur at the left upper  sternal border and a 2/6 holosystolic murmur at the apex.  The PMI is  nondisplaced.  SKIN:  No rash or lesions.  ABDOMEN:  Soft and nontender, protuberant but no rebound, no guarding.  GU AND RECTAL:  Deferred.  EXTREMITIES:  No cyanosis or clubbing.  There is 1+ peripheral pitting edema  bilaterally.  NEUROLOGIC:  The patient is alert and oriented, grossly nonfocal.   Chest x-ray is pending.   EKG:  Sinus bradycardia with nonspecific P-wave changes in the inferior  leads but no acute ischemia.   LABORATORY DATA:  CBC, BMET and PT/INR are pending.   PROBLEM LIST:  1.  Substernal chest pain, rule out acute coronary syndrome.      1.  History of coronary artery disease, status post coronary artery          bypass grafting in 2004.      2.  Multiple cardiac risk factors including diabetes mellitus. 2.  History of paroxysmal atrial fibrillation.      1.  On Coumadin  therapy.      2.  On antiarrhythmic therapy with amiodarone.  3.  Rule out carotid bruits.      1.  Evaluation for carotid disease in 2004 which was negative.      2.  Possible referred aortic murmur.  3.  Rule out aortic stenosis.  4.  Obesity.  5.  Diabetes mellitus diagnosed approximately one year ago on oral      hypoglycemic therapy.  6.  Hypertension.  7.  Rule out sleep apnea.   PLAN:  1.  The patient's symptoms are very concerning for an acute coronary      syndrome.  He has multiple cardiac risk factors including diabetes      mellitus.  I have discussed with the patient the need to proceed to      cardiac catheterization.  The patient is willing to proceed.  2.  Discontinue Coumadin and start heparin when INR is less than 2.0.      PT/INR is pending and will perform catheterization at the earliest time.  3.  Atrial fibrillation.  Patient remains in normal sinus rhythm.  Continue      on amiodarone for right now but we may make a decision in the future to      stop amiodarone therapy.  Will discuss this with Duke Salvia, M.D.,      as the patient is at a rather young age.  Potential decision could be      made in the future to consider him for an ablative procedure if he has      recurrent atrial fibrillation.  4.  The patient will be admitted for rule out myocardial infarction and      carotid Dopplers will also be obtained to rule out carotid disease, the      latter which I think is unlikely.       GED/MEDQ  D:  01/17/2005  T:  01/17/2005  Job:  [id number]

## 2010-12-24 ENCOUNTER — Encounter: Payer: Self-pay | Admitting: *Deleted

## 2010-12-27 ENCOUNTER — Other Ambulatory Visit: Payer: Self-pay | Admitting: Cardiology

## 2010-12-27 ENCOUNTER — Encounter: Payer: Self-pay | Admitting: Cardiology

## 2010-12-27 ENCOUNTER — Encounter: Payer: Self-pay | Admitting: *Deleted

## 2010-12-27 ENCOUNTER — Ambulatory Visit (INDEPENDENT_AMBULATORY_CARE_PROVIDER_SITE_OTHER): Payer: Commercial Indemnity | Admitting: Cardiology

## 2010-12-27 ENCOUNTER — Telehealth: Payer: Self-pay | Admitting: *Deleted

## 2010-12-27 VITALS — BP 142/91 | HR 76 | Wt 289.0 lb

## 2010-12-27 DIAGNOSIS — Z951 Presence of aortocoronary bypass graft: Secondary | ICD-10-CM

## 2010-12-27 DIAGNOSIS — R072 Precordial pain: Secondary | ICD-10-CM

## 2010-12-27 DIAGNOSIS — I2510 Atherosclerotic heart disease of native coronary artery without angina pectoris: Secondary | ICD-10-CM

## 2010-12-27 DIAGNOSIS — I4891 Unspecified atrial fibrillation: Secondary | ICD-10-CM

## 2010-12-27 DIAGNOSIS — I25810 Atherosclerosis of coronary artery bypass graft(s) without angina pectoris: Secondary | ICD-10-CM

## 2010-12-27 DIAGNOSIS — E785 Hyperlipidemia, unspecified: Secondary | ICD-10-CM

## 2010-12-27 DIAGNOSIS — K922 Gastrointestinal hemorrhage, unspecified: Secondary | ICD-10-CM | POA: Insufficient documentation

## 2010-12-27 NOTE — Assessment & Plan Note (Signed)
The patient is status post significant GI bleeding requiring 4 units of packed red blood cells. He had an upper and lower endoscopy done. A couple of polyps removed. Ultimately it was decided that he was bleeding secondary to Celebrex which he uses for chronic arthritis and this has been discontinued. He now takes high-dose Tylenol for his arthritis

## 2010-12-27 NOTE — Assessment & Plan Note (Signed)
The patient reports significant dyspnea although this is somewhat chronic. The patient is significantly obese and has difficulty controlling his appetite. His wife states that he continues to gain weight and continues to eat to meals in the evening. However the patient since his bypass surgery in 2004 has had no followup a Cardiolite study and given his ongoing symptoms of shortness of breath we will refer him for a Lexiscan.[name]

## 2010-12-27 NOTE — Assessment & Plan Note (Signed)
The patient state that is followed by his primary care physician in his lipid panel apparently has been at goal. [facility] defer further management of his lipid panel to his primary care physician.

## 2010-12-27 NOTE — Progress Notes (Signed)
Pt is not taking ASA following hospitalization in January for GI Bleed. Pt states his primary MD told him to d/w Dr. Earnestine Leys to determine if he should restart ASA.

## 2010-12-27 NOTE — Patient Instructions (Addendum)
   Lexiscan stress test If the results of your test are normal or stable, you will receive a letter.  If they are abnormal, the nurse will contact you by phone. Your physician wants you to follow up in: 6 months.  You will receive a reminder letter in the mail one-two months in advance.  If you don't receive a letter, please call our office to schedule the follow up appointment  

## 2010-12-27 NOTE — Telephone Encounter (Signed)
LEXISCAN CARDIOLITE ( [phone] 5'8  SCHEDULED FOR 6/12 & 6/13 @ [facility]   2 DAY PROTOCOL

## 2010-12-27 NOTE — Progress Notes (Signed)
HPI The patient is a 65 year old male with history of permanent atrial fibrillation on warfarin therapy. He status post coronary bypass grafting. He has chronic exertional dyspnea in large part due to deconditioning. The patient was last seen in the office in November of 2011. He denied any chest pain or shortness of breath. At that time he was playing 18 holes of golf without any difficulty. However in January the patient had a significant GI bleed which required hospitalization. This was in the setting of nonsteroidal use in particular Celebrex recheck been taking for a long time for his arthritis. He required 4 units of blood. He still remains on iron therapy. He underwent an upper and lower endoscopy. Some polyps were removed. The patient now reports still significant shortness of breath. He denies however any chest pain. Unfortunately he continues to gain weight and eat several meals a day. His wife describe to me that the other night he went to a restaurant in the evening and then when he got home before bedtime he had another meal. I've told the patient the past I suspect his shortness of breath is likely due to deconditioning and being overweight. However the patient has not had a stress test since his bypass surgery in 2004. Also his blood pressure is elevated in the past his wife to make some blood pressure readings as this could be contributing to his shortness of breath.  Allergies  Allergen Reactions  . Codeine Other (See Comments)    Hurting in chest  . Diltiazem Hcl     REACTION: itch  . Niacin     REACTION: burns    Current Outpatient Prescriptions on File Prior to Visit  Medication Sig Dispense Refill  . acetaminophen (TYLENOL) 500 MG tablet Take 500 mg by mouth daily as needed.        . digoxin (LANOXIN) 0.125 MG tablet Take 1 tablet (125 mcg total) by mouth daily.  90 tablet  3  . ferrous sulfate 324 (65 FE) MG TBEC Take 1.5 tablets by mouth 2 (two) times daily.        .  furosemide (LASIX) 80 MG tablet Take 1 tablet (80 mg total) by mouth daily.  90 tablet  3  . glimepiride (AMARYL) 4 MG tablet Take 4 mg by mouth daily before breakfast.        . lisinopril (PRINIVIL,ZESTRIL) 10 MG tablet Take 10 mg by mouth daily.        . metoprolol (LOPRESSOR) 50 MG tablet Take 1 1/2 tabs twice a day  270 tablet  3  . Multiple Vitamin (MULTIVITAMIN) tablet Take 1 tablet by mouth daily.        . Omega-3 Fatty Acids (FISH OIL) 1200 MG CAPS Take 1 capsule by mouth 2 (two) times daily.        . potassium chloride SA (K-DUR,KLOR-CON) 20 MEQ tablet Take 1 tablet (20 mEq total) by mouth 2 (two) times daily.  180 tablet  3  . sitaGLIPtan (JANUVIA) 100 MG tablet Take 100 mg by mouth daily.        . verapamil (CALAN) 80 MG tablet Take 80 mg by mouth 3 (three) times daily.        [name] warfarin (COUMADIN) 5 MG tablet Take 5 mg by mouth as directed.        [name] DISCONTD: omeprazole (PRILOSEC) 40 MG capsule Take 40 mg by mouth 2 (two) times daily.        [name] aspirin 81 MG tablet  Take 81 mg by mouth daily.        [name] DISCONTD: celecoxib (CELEBREX) 100 MG capsule Take 100 mg by mouth daily.        [name] DISCONTD: metFORMIN (GLUCOPHAGE) 500 MG tablet Take 1,500 mg by mouth daily with breakfast.        . DISCONTD: rosuvastatin (CRESTOR) 20 MG tablet Take 20 mg by mouth daily.          Past Medical History  Diagnosis Date  . Overweight   . Coronary atherosclerosis of artery bypass graft   . Other and unspecified hyperlipidemia   . Atrial fibrillation   . Shortness of breath   . Type II or unspecified type diabetes mellitus without mention of complication, not stated as uncontrolled   . OSA (obstructive sleep apnea)   . Insomnia     Past Surgical History  Procedure Date  . Coronary artery bypass graft  10/15/2002     Salvatore Decent. Dorris Fetch, M.D.     . Carpal tunnel release   10/08/2003  . Lipoma surgery     Family History  Problem Relation Age of Onset  . Coronary artery disease      FAMILY  HISTORY    History   Social History  . Marital Status: Married    Spouse Name: N/A    Number of Children: N/A  . Years of Education: N/A   Occupational History  . Not on file.   Social History Main Topics  . Smoking status: Former Smoker    Quit date: 07/25/2000  . Smokeless tobacco: Former Neurosurgeon  . Alcohol Use: Yes     Occasionally  . Drug Use: No  . Sexually Active: Not on file   Other Topics Concern  . Not on file   Social History Narrative  . No narrative on file   [id number] positives as outlined above. The remainder of the 18  point review of systems is negative   PHYSICAL EXAM BP 142/91  Pulse 76  Wt 289 lb (131.09 kg)  General: Obese white male in no distress  Head: Normocephalic and atraumatic Eyes:PERRLA/EOMI intact, conjunctiva and lids normal Ears: No deformity or lesions Mouth:normal dentition, normal posterior pharynx Neck: Supple, no JVD.  No masses, thyromegaly or abnormal cervical nodes Lungs: Normal breath sounds bilaterally without wheezing.  Normal percussion Cardiac: regular rate and rhythm with normal S1 and S2, no S3 or S4.  PMI is normal.  No pathological murmurs Abdomen: Normal bowel sounds, abdomen is soft and nontender without masses, organomegaly or hernias noted.  No hepatosplenomegaly MSK: Back normal, normal gait muscle strength and tone normal Vascular: Pulse is normal in all 4 extremities Extremities: No peripheral pitting edema Neurologic: Alert and oriented x 3 Skin: Intact without lesions or rashes Lymphatics: No significant adenopathy Psychologic: Normal affect   ECG: Not available  ASSESSMENT AND PLAN

## 2010-12-27 NOTE — Assessment & Plan Note (Signed)
Permanent atrial fibrillation the patient is on warfarin therapy. I told him that he also should resume low-dose aspirin in the setting of his coronary artery disease and he should be safe now after his prior bleeding several months ago. CBCs however will need to be followed closely by his primary care physician he also remains on iron therapy.

## 2010-12-28 NOTE — Telephone Encounter (Signed)
AUTH # [id number] EXP 02/26/11

## 2010-12-29 ENCOUNTER — Encounter: Payer: Self-pay | Admitting: *Deleted

## 2011-01-03 ENCOUNTER — Other Ambulatory Visit: Payer: Self-pay | Admitting: *Deleted

## 2011-01-03 MED ORDER — LISINOPRIL 10 MG PO TABS: 10.0000 mg | ORAL_TABLET | Freq: Every day | ORAL | Status: DC

## 2011-01-05 DIAGNOSIS — I2510 Atherosclerotic heart disease of native coronary artery without angina pectoris: Secondary | ICD-10-CM

## 2011-01-12 ENCOUNTER — Telehealth: Payer: Self-pay | Admitting: Cardiology

## 2011-01-12 NOTE — Telephone Encounter (Signed)
Informed patient's wife of normal stress test results. Patient's wife wants to know what can be done for the sob patient is having since he is still having this same problem. Wife said that patient had same symptoms last time he had to have triple bypass. Nurse advised wife that patient could go to ED for evaluations if symptoms got worse. Wife verbalized understanding of plan. Please advise.

## 2011-01-17 ENCOUNTER — Encounter: Payer: Self-pay | Admitting: *Deleted

## 2011-01-17 ENCOUNTER — Ambulatory Visit (INDEPENDENT_AMBULATORY_CARE_PROVIDER_SITE_OTHER): Payer: Commercial Indemnity | Admitting: Cardiology

## 2011-01-17 ENCOUNTER — Telehealth: Payer: Self-pay | Admitting: *Deleted

## 2011-01-17 DIAGNOSIS — R0602 Shortness of breath: Secondary | ICD-10-CM

## 2011-01-17 DIAGNOSIS — I25810 Atherosclerosis of coronary artery bypass graft(s) without angina pectoris: Secondary | ICD-10-CM

## 2011-01-17 DIAGNOSIS — I4891 Unspecified atrial fibrillation: Secondary | ICD-10-CM

## 2011-01-17 DIAGNOSIS — R072 Precordial pain: Secondary | ICD-10-CM

## 2011-01-17 DIAGNOSIS — D649 Anemia, unspecified: Secondary | ICD-10-CM

## 2011-01-17 DIAGNOSIS — K922 Gastrointestinal hemorrhage, unspecified: Secondary | ICD-10-CM

## 2011-01-17 DIAGNOSIS — Z7901 Long term (current) use of anticoagulants: Secondary | ICD-10-CM

## 2011-01-17 MED ORDER — NITROGLYCERIN 0.4 MG SL SUBL: 0.4000 mg | SUBLINGUAL_TABLET | SUBLINGUAL | Status: DC | PRN

## 2011-01-17 NOTE — Telephone Encounter (Signed)
Spoke with patient's wife who is agreeable to have pt seen at 2:30pm today with Dr. Earnestine Leys to discuss possible cath.

## 2011-01-17 NOTE — Progress Notes (Signed)
HPI The patient is a 65 year old male with a history of permanent atrial fibrillation on warfarin therapy. Status post coronary bypass grafting in 2004 he status post stent placement to a vein graft in 2006. Earlier this year the patient has significant GI bleed which required hospitalization. This was in the setting of nonsteroidal use in particular Celebrex. Apparently the patient required 4 units of blood. In addition he was on aspirin and Coumadin. At that time he underwent upper and lower endoscopy. He has been followed over the last couple of months by his primary care physician and his blood counts have remained stable. Prior to his GI bleeding the patient despite his weight and decreased exercise tolerance was still able to play 18 holes of golf. His wife has been very concerned of lately because of his increased shortness of breath. I saw him just a few weeks ago for this. He denied any chest pain at that time. He was also gaining weight again and typically eats twice in the evening. Although I felt that his shortness of breath was due to deconditioning and being overweight we went ahead and ordered a stress test. Cardiolite study was essentially within normal limits with no definite ischemia and an ejection fraction of 60%. There was a medium, fixed mid to basal inferior defect. The patient's wife however requested to see me again because she feels that her husband is getting worse. She states that in the past prior to his bypass surgery he had multiple negative stress tests and yet he had significant coronary artery disease. She states that she has no great take in the results of the stress test. The patient does report to me that his shortness of breath has significantly worsened. He states that last Monday when he was cutting his grass on his riding lawnmower, branches kept hitting him in the head. He states that he got some cutters and after a couple of cuts he was totally out of breath. He states  that this is very unusual for him. Again there was no associated chest pain. I brought the patient back to the office today because otherwise concern and is worsening symptoms and I told him that we should proceed with cardiac catheterization. Allergies  Allergen Reactions  . Codeine Other (See Comments)    Hurting in chest  . Diltiazem Hcl     REACTION: itch  . Niacin     REACTION: burns    Current Outpatient Prescriptions on File Prior to Visit  Medication Sig Dispense Refill  . acetaminophen (TYLENOL) 500 MG tablet Take 500 mg by mouth daily as needed.        [name] aspirin 81 MG tablet Take 81 mg by mouth daily.        [name] atorvastatin (LIPITOR) 20 MG tablet Take 20 mg by mouth at bedtime.        . digoxin (LANOXIN) 0.125 MG tablet Take 1 tablet (125 mcg total) by mouth daily.  90 tablet  3  . ferrous sulfate 324 (65 FE) MG TBEC Take 1.5 tablets by mouth 2 (two) times daily.        . furosemide (LASIX) 80 MG tablet Take 1 tablet (80 mg total) by mouth daily.  90 tablet  3  . glimepiride (AMARYL) 4 MG tablet Take 4 mg by mouth daily before breakfast.        . lisinopril (PRINIVIL,ZESTRIL) 10 MG tablet Take 1 tablet (10 mg total) by mouth daily.  90 tablet  3  .  metFORMIN (GLUCOPHAGE) 1000 MG tablet Take 1,000 mg by mouth 2 (two) times daily with a meal.        . metoprolol (LOPRESSOR) 50 MG tablet Take 1 1/2 tabs twice a day  270 tablet  3  . Multiple Vitamin (MULTIVITAMIN) tablet Take 1 tablet by mouth daily.        . Omega-3 Fatty Acids (FISH OIL) 1200 MG CAPS Take 1 capsule by mouth 2 (two) times daily.        [name] omeprazole (PRILOSEC) 20 MG capsule Take 40 mg by mouth 2 (two) times daily.        . potassium chloride SA (K-DUR,KLOR-CON) 20 MEQ tablet Take 1 tablet (20 mEq total) by mouth 2 (two) times daily.  180 tablet  3  . sitaGLIPtan (JANUVIA) 100 MG tablet Take 100 mg by mouth daily.        . verapamil (CALAN) 80 MG tablet Take 80 mg by mouth 3 (three) times daily.        [name] warfarin  (COUMADIN) 5 MG tablet Take 5 mg by mouth as directed.          Past Medical History  Diagnosis Date  . Overweight   . Coronary atherosclerosis of artery bypass graft   . Other and unspecified hyperlipidemia   . Atrial fibrillation   . Shortness of breath   . Type II or unspecified type diabetes mellitus without mention of complication, not stated as uncontrolled   . OSA (obstructive sleep apnea)   . Insomnia     Past Surgical History  Procedure Date  . Coronary artery bypass graft  10/15/2002     Salvatore Decent. Dorris Fetch, M.D.     . Carpal tunnel release   10/08/2003  . Lipoma surgery     Family History  Problem Relation Age of Onset  . Coronary artery disease      FAMILY HISTORY    History   Social History  . Marital Status: Married    Spouse Name: N/A    Number of Children: N/A  . Years of Education: N/A   Occupational History  . Not on file.   Social History Main Topics  . Smoking status: Former Smoker    Quit date: 07/25/2000  . Smokeless tobacco: Former Neurosurgeon  . Alcohol Use: Yes     Occasionally  . Drug Use: No  . Sexually Active: Not on file   Other Topics Concern  . Not on file   Social History Narrative  . No narrative on file    ZOX:WRUEAVWUJ positives as outlined above. The remainder of the 18  point review of systems is negative  PHYSICAL EXAM BP 148/79  Pulse 79  Ht 5\' 8"  (1.727 m)  Wt 286 lb (129.729 kg)  BMI 43.49 kg/m2  SpO2 96%  General: Well-developed, well-nourished in no distress Head: Normocephalic and atraumatic Eyes:PERRLA/EOMI intact, conjunctiva and lids normal Ears: No deformity or lesions Mouth:normal dentition, normal posterior pharynx Neck: Supple, no JVD.  No masses, thyromegaly or abnormal cervical nodes Lungs: Normal breath sounds bilaterally without wheezing.  Normal percussion Cardiac: regular rate and rhythm with normal S1 and S2, no S3 or S4.  PMI is normal.  No pathological murmurs Abdomen: Normal bowel sounds,  abdomen is soft and nontender without masses, organomegaly or hernias noted.  No hepatosplenomegaly MSK: Back normal, normal gait muscle strength and tone normal Vascular: Pulse is normal in all 4 extremities Extremities: No peripheral pitting edema Neurologic: Alert and oriented  x 3 Skin: Intact without lesions or rashes Lymphatics: No significant adenopathy Psychologic: Normal affect a   ZOX:WRUEAV fibrillation nonspecific ST-T wave changes.  ASSESSMENT AND PLAN

## 2011-01-17 NOTE — Assessment & Plan Note (Addendum)
The patient is status post prior coronary bypass grafting in 2004 as well as stent placement in 2006 to a vein graft. The patient's symptoms of shortness of breath are certainly concerning. He also had no significant chest pain at the time of his bypass surgery and had multiple false-negative stress Cardiolite studies. He has significant risk factors including diabetes mellitus. At this point I do think we need to proceed with a left heart catheterization to reevaluate the patient's coronary anatomy. It is certainly possible that he has again a false-negative stress Cardiolite study. I discussed the risks and benefits of a cardiac catheterization with the patient. He is instructed to hold his Coumadin for 4-5 days prior to catheterization and blood work will be obtained today including a PT/INR and hopefully we can do a catheterization later this week

## 2011-01-17 NOTE — Telephone Encounter (Signed)
At this point the only way to further evaluate the patient is to do a left and right heart catheterization. I agree with the wife that this is probably indicated to proceed with further testing. Stress test could be a false-negative study. Is also more than 5 years after his bypass surgery. If he wants to proceed with cardiac catheterization Coumadin will need to be held 5 days prior to procedure. Patient's wife can call me in the office to further discuss if needed.

## 2011-01-17 NOTE — Assessment & Plan Note (Signed)
Apparently stable and had recently a CBC done and also had normal iron levels. No indication of discomfort into symptoms.

## 2011-01-17 NOTE — Telephone Encounter (Signed)
No precert required for heart cath /[employer]

## 2011-01-17 NOTE — Assessment & Plan Note (Signed)
No recurrence of GI bleeding and tolerating aspirin and Coumadin. Celebrex has been discontinued

## 2011-01-17 NOTE — Patient Instructions (Addendum)
Your physician has requested that you have a cardiac catheterization. Cardiac catheterization is used to diagnose and/or treat various heart conditions. Doctors may recommend this procedure for a number of different reasons. The most common reason is to evaluate chest pain. Chest pain can be a symptom of coronary artery disease (CAD), and cardiac catheterization can show whether plaque is narrowing or blocking your heart's arteries. This procedure is also used to evaluate the valves, as well as measure the blood flow and oxygen levels in different parts of your heart. For further information please visit [email or web address] Please follow instruction sheet, as given. Your physician recommends that you go to the [facility] for lab work and chest x-ray. DO TODAY!

## 2011-01-17 NOTE — Assessment & Plan Note (Signed)
The patient is in permanent atrial fibrillation. His rate remains controlled. I do not think this is particularly contributing to her shortness of breath. He has also been compliant with his Coumadin therapy.

## 2011-01-17 NOTE — Telephone Encounter (Signed)
(  L) Heart cath (Arm, if possible) Friday, January 21, 2011 Arrive at 0830 for 0930 case Dr. Excell Seltzer

## 2011-01-19 ENCOUNTER — Telehealth: Payer: Self-pay | Admitting: *Deleted

## 2011-01-19 NOTE — Telephone Encounter (Signed)
Pt's wife notified of results and verbalized understanding. She will d/w primary MD after cath.

## 2011-01-19 NOTE — Telephone Encounter (Signed)
Message copied by Arlyss Gandy on Wed Jan 19, 2011  2:03 PM ------      Message from: [name]      Created: Tue Jan 18, 2011 12:04 PM       Ready for catheterization based on anticoagulation parameters. No renal insufficiency. The patient does have mild anemia and mild thrombocytopenia which will need to be evaluated after his cardiac catheterization, but currently no contraindications to proceed with catheterization based on those data.

## 2011-01-21 ENCOUNTER — Inpatient Hospital Stay (HOSPITAL_BASED_OUTPATIENT_CLINIC_OR_DEPARTMENT_OTHER)
Admission: RE | Admit: 2011-01-21 | Discharge: 2011-01-21 | Disposition: A | Discharge status: Home / Self Care | Payer: Commercial Indemnity | Source: Ambulatory Visit | Attending: Cardiovascular Disease | Admitting: Cardiovascular Disease

## 2011-01-21 DIAGNOSIS — R0609 Other forms of dyspnea: Secondary | ICD-10-CM | POA: Insufficient documentation

## 2011-01-21 DIAGNOSIS — R0989 Other specified symptoms and signs involving the circulatory and respiratory systems: Secondary | ICD-10-CM | POA: Insufficient documentation

## 2011-01-21 DIAGNOSIS — I2510 Atherosclerotic heart disease of native coronary artery without angina pectoris: Secondary | ICD-10-CM | POA: Insufficient documentation

## 2011-01-21 DIAGNOSIS — Z951 Presence of aortocoronary bypass graft: Secondary | ICD-10-CM | POA: Insufficient documentation

## 2011-01-24 LAB — POCT I-STAT GLUCOSE
Glucose, Bld: 159 mg/dL — ABNORMAL HIGH (ref 70–99)
Operator id: 141321

## 2011-02-02 ENCOUNTER — Encounter: Payer: Commercial Indemnity | Admitting: Adult Health

## 2011-02-04 ENCOUNTER — Encounter: Payer: Commercial Indemnity | Admitting: Adult Health

## 2011-02-09 ENCOUNTER — Ambulatory Visit: Payer: Commercial Indemnity | Admitting: Physician Assistant

## 2011-02-10 NOTE — Cardiovascular Report (Signed)
[name], [name]               ACCOUNT NO.:  [id number]  MEDICAL RECORD NO.:  [id number]  LOCATION:                                 FACILITY:  PHYSICIAN:  Veverly Fells. Excell Seltzer, MD  DATE OF BIRTH:  03/19/1946  DATE OF PROCEDURE:  01/21/2011 DATE OF DISCHARGE:                           CARDIAC CATHETERIZATION   PROCEDURE:  Left heart catheterization, selective coronary angiography, left ventricular angiography, saphenous vein graft angiography, LIMA angiography.  PROCEDURAL INDICATIONS:  [name] is a 64 year old gentleman with coronary artery disease and atrial fibrillation.  He has progressive dyspnea.  He has had an evaluation with a Myoview scan that showed no ischemia.  However, he has had a similar normal Myoview in the past, at that time, when he was ultimately found to have severe coronary artery disease and required coronary artery bypass surgery.  The Myoview result was therefore felt to be unreliable in this particular patient and he was referred for definitive study.  Risks and indications of procedure were reviewed with the patient. Informed consent was obtained.  The right groin was prepped, draped and anesthetized with 1% lidocaine using modified Seldinger technique.  A 5- French sheath was placed in the right femoral artery.  We used a long sheath.  The artery was very deep.  The artery was accessed with a front wall puncture.  Standard Judkins catheters were used for coronary angiography and left ventriculography.  An AL-1 catheter was used to image the free radial graft to the OM and a 3DRC catheter was used to image the mammary artery as well as the vein graft to the diagonal.  The patient tolerated procedure well.  There were no immediate complications.  PROCEDURAL FINDINGS:  Aortic pressure 137/71 with a mean of 101, left ventricular pressure 142/28.  Coronary angiography, the left mainstem is widely patent.  There is no obstructive disease.  It  divides into the LAD and left circumflex.  LAD.  The LAD is of moderate caliber.  There is diffuse proximal LAD stenosis of 50-60%.  There is 80% mid LAD stenosis.  The distal vessel fills competitively from the LIMA graft and a diagonal in the diagonal graft.  Left circumflex.  The left circumflex is patent in the proximal aspect. There is moderately tight mid 75% stenosis leading into an OM branch.  Right coronary artery.  The right coronary artery is dominant.  The vessels patent throughout its course with mild luminal irregularities. It gives off a PDA branch.  Bypass graft angiography.  The LIMA to LAD is widely patent.  There is no significant obstructive disease.  The LAD fills predominantly from LIMA flow.  Saphenous vein graft to diagonal is widely patent.  There is no obstructive disease.  There is a patent stent in the distal portion of the graft.  Free radial graft to OM is widely patent with no obstructive disease.  FINAL ASSESSMENT: 1. Two-vessel coronary artery disease involving the LAD and left     circumflex. 2. Continued patency of the LIMA to LAD, vein graft to diagonal, and     free radial graft to OM. 3. Normal LV function.  RECOMMENDATIONS:  The patient will be  continued on with medical therapy for his coronary artery disease and I do not think there is a cardiac etiology to the patient's dyspnea.     Veverly Fells. Excell Seltzer, MD     MDC/MEDQ  D:  01/21/2011  T:  01/22/2011  Job:  [id number]  cc:   Carlyon Shadow, MD  Electronically Signed by Tonny Bollman MD on 02/10/2011 12:36:26 AM

## 2011-02-14 ENCOUNTER — Encounter: Payer: Self-pay | Admitting: Adult Health

## 2011-02-14 ENCOUNTER — Ambulatory Visit (INDEPENDENT_AMBULATORY_CARE_PROVIDER_SITE_OTHER): Payer: Commercial Indemnity | Admitting: Adult Health

## 2011-02-14 ENCOUNTER — Encounter: Payer: Self-pay | Admitting: *Deleted

## 2011-02-14 VITALS — BP 110/65 | HR 66 | Ht 68.0 in | Wt 290.0 lb

## 2011-02-14 DIAGNOSIS — R0600 Dyspnea, unspecified: Secondary | ICD-10-CM

## 2011-02-14 DIAGNOSIS — R0602 Shortness of breath: Secondary | ICD-10-CM

## 2011-02-14 DIAGNOSIS — E663 Overweight: Secondary | ICD-10-CM

## 2011-02-14 DIAGNOSIS — I25810 Atherosclerosis of coronary artery bypass graft(s) without angina pectoris: Secondary | ICD-10-CM

## 2011-02-14 DIAGNOSIS — R0609 Other forms of dyspnea: Secondary | ICD-10-CM

## 2011-02-14 DIAGNOSIS — R0989 Other specified symptoms and signs involving the circulatory and respiratory systems: Secondary | ICD-10-CM

## 2011-02-14 NOTE — Assessment & Plan Note (Signed)
Cardiac cath is reassuring. No evidence of new areas of stenosis with patent grafts. This was explained to the patient who is frustrated that nothing was found concerning his breathing within the limitations of the cath.  He will continue on current medications. Follow-up with Dr. Andee Lineman.

## 2011-02-14 NOTE — Assessment & Plan Note (Signed)
We have discussed the need for him to lose weight. I have advised him that he has gained 5 lbs since being seen last. He admits to dietary noncompliance with salt and diabetic diet.  He may need to be referred to physician for bariatric surgery options.  I have given him diet instructions on low salt and diabetic diet.  He will need close follow-up with primary to manage this and referral for wt loss surgery at their discretion.

## 2011-02-14 NOTE — Patient Instructions (Addendum)
You will follow up with Dr. Andee Lineman on 05/02/11 @1 :00pm at the [facility] office. Continue your current medications You will have pulmonary function testing done. You will be called once this is scheduled.

## 2011-02-14 NOTE — Assessment & Plan Note (Signed)
His obesity and known history of OSA are playing a role in his breathing status. He refused to wear CPAP because he cannot find one that is comfortable.  I am going to order PFT's to assess lung fx and send the results to PCP.  He may need to have assistance with inhalers to improve breathing.  He needs to lose a significant amount of weight.

## 2011-02-14 NOTE — Progress Notes (Signed)
HPI:  [name] is a 65 y/o morbidly obese CM patient of Dr. Andee [name] in the [facility] office we are following post cardiac catheterization on 01/21/11.  This was completed secondary to increasing shortness of breath with known history of CAD s/p CABG (LIMA-LAD, SVG to diagonal and free radial graft to OM). Dr. Andee [name] wrote a lengthy note concerning the patients status to include his symptoms prior to scheduling the cath. Please review his note on 01/17/2011.  Patient's cath results were reassuring.  He had continued patency of his grafts and normal LV fx.and normal intracardiac pressures.  He states that he is feeling worse than he did prior to the cath concerning his dyspnea. It is noted that the patient has poor dietary compliance with salt and diabetic diet, and has gained 5 lbs since last visit.  He is very frustrated and wants help with his symptoms.  Allergies  Allergen Reactions  . Codeine Other (See Comments)    Hurting in chest  . Diltiazem Hcl     REACTION: itch  . Niacin     REACTION: burns    Current Outpatient Prescriptions  Medication Sig Dispense Refill  . acetaminophen (TYLENOL) 500 MG tablet Take 500 mg by mouth daily as needed.        [name] aspirin 81 MG tablet Take 81 mg by mouth daily.        [name] atorvastatin (LIPITOR) 20 MG tablet Take 20 mg by mouth at bedtime.        . digoxin (LANOXIN) 0.125 MG tablet Take 1 tablet (125 mcg total) by mouth daily.  90 tablet  3  . ferrous sulfate 324 (65 FE) MG TBEC Take 1.5 tablets by mouth 2 (two) times daily.        . furosemide (LASIX) 80 MG tablet Take 1 tablet (80 mg total) by mouth daily.  90 tablet  3  . glimepiride (AMARYL) 4 MG tablet Take 4 mg by mouth daily before breakfast.        . lisinopril (PRINIVIL,ZESTRIL) 10 MG tablet Take 1 tablet (10 mg total) by mouth daily.  90 tablet  3  . metFORMIN (GLUCOPHAGE) 1000 MG tablet Take 1,000 mg by mouth 2 (two) times daily with a meal.        . metoprolol (LOPRESSOR) 50 MG tablet Take 1 1/2  tabs twice a day  270 tablet  3  . Multiple Vitamin (MULTIVITAMIN) tablet Take 1 tablet by mouth daily.        . Omega-3 Fatty Acids (FISH OIL) 1200 MG CAPS Take 1 capsule by mouth 2 (two) times daily.        [name] omeprazole (PRILOSEC) 20 MG capsule Take 40 mg by mouth 2 (two) times daily.        . potassium chloride SA (K-DUR,KLOR-CON) 20 MEQ tablet Take 1 tablet (20 mEq total) by mouth 2 (two) times daily.  180 tablet  3  . sitaGLIPtan (JANUVIA) 100 MG tablet Take 100 mg by mouth daily.        . verapamil (CALAN) 80 MG tablet Take 80 mg by mouth 3 (three) times daily.        [name] warfarin (COUMADIN) 5 MG tablet Take 5 mg by mouth as directed.        . nitroGLYCERIN (NITROSTAT) 0.4 MG SL tablet Place 1 tablet (0.4 mg total) under the tongue every 5 (five) minutes as needed for chest pain.  25 tablet  3    Past Medical History  Diagnosis Date  . Overweight   . Coronary atherosclerosis of artery bypass graft   . Other and unspecified hyperlipidemia   . Atrial fibrillation   . Shortness of breath   . Type II or unspecified type diabetes mellitus without mention of complication, not stated as uncontrolled   . OSA (obstructive sleep apnea)   . Insomnia     Past Surgical History  Procedure Date  . Coronary artery bypass graft  10/15/2002     Salvatore Decent. Dorris Fetch, M.D.     . Carpal tunnel release   10/08/2003  . Lipoma surgery     WUJ:WJXBJY of systems complete and found to be negative unless listed above PHYSICAL EXAM BP 110/65  Pulse 66  Ht 5\' 8"  (1.727 m)  Wt 290 lb (131.543 kg)  BMI 44.09 kg/m2  SpO2 96%  General: Well developed, well nourished, in no acute distress Head: Eyes PERRLA, No xanthomas.   Normal cephalic and atramatic  Lungs: Clear bilaterally to auscultation and percussion. Heart: IRRR S1 S2, with 2/6 systolic murmur..  Pulses are 2+ & equal. No carotid bruit. No JVD.  No abdominal bruits. No femoral bruits. Abdomen: Very obese Bowel sounds are positive, abdomen soft  and non-tender without masses or                  Hernia's noted. Msk:  Back normal, normal gait. Normal strength and tone for age. Extremities: No clubbing, cyanosis or edema.  DP +1 Neuro: Alert and oriented X 3. Psych:  Good affect, responds appropriately    ASSESSMENT AND PLAN

## 2011-02-15 ENCOUNTER — Telehealth: Payer: Self-pay

## 2011-02-15 NOTE — Telephone Encounter (Signed)
Pt's wife states that she cant find pt's bottle of Furosemide and that we misplaced it at yesterdays OV. She first talked with Aurther Loft who told her that we would check the office for the lost bottle. While searching for the medication, pt's wife called back and was very verbally abusive to [place] who triaged pt. at yesterday's OV. The call was then transferred to me. I attempted to calm her down by offering to call in a RX for furosemide to [facility] as this medication is on their $4.00 list, I also offered to pay for RX. Pt's wife continued to complain and cuss. Pt's wife was advised that I would not talk to her while she was being verbally abusive and to call back once she calmed down.

## 2011-02-18 ENCOUNTER — Other Ambulatory Visit: Payer: Self-pay | Admitting: *Deleted

## 2011-02-18 DIAGNOSIS — R0600 Dyspnea, unspecified: Secondary | ICD-10-CM

## 2011-02-23 ENCOUNTER — Ambulatory Visit (HOSPITAL_COMMUNITY): Payer: Managed Care, Other (non HMO)

## 2011-03-21 ENCOUNTER — Ambulatory Visit (HOSPITAL_COMMUNITY)
Admission: RE | Admit: 2011-03-21 | Discharge: 2011-03-21 | Disposition: A | Discharge status: Home / Self Care | Payer: Managed Care, Other (non HMO) | Source: Ambulatory Visit | Attending: Cardiology | Admitting: Cardiology

## 2011-03-21 DIAGNOSIS — R0989 Other specified symptoms and signs involving the circulatory and respiratory systems: Secondary | ICD-10-CM | POA: Insufficient documentation

## 2011-03-21 DIAGNOSIS — R0609 Other forms of dyspnea: Secondary | ICD-10-CM | POA: Insufficient documentation

## 2011-03-21 DIAGNOSIS — J449 Chronic obstructive pulmonary disease, unspecified: Secondary | ICD-10-CM | POA: Insufficient documentation

## 2011-03-21 DIAGNOSIS — J4489 Other specified chronic obstructive pulmonary disease: Secondary | ICD-10-CM | POA: Insufficient documentation

## 2011-03-21 LAB — PULMONARY FUNCTION TEST

## 2011-03-21 LAB — BLOOD GAS, ARTERIAL
Acid-base deficit: 0.5 mmol/L (ref 0.0–2.0)
Bicarbonate: 23.3 meq/L (ref 20.0–24.0)
FIO2: 0.21 %
O2 Saturation: 97.7 %
Patient temperature: 37
TCO2: 21.9 mmol/L (ref 0–100)
pCO2 arterial: 36.3 mmHg (ref 35.0–45.0)
pH, Arterial: 7.424 (ref 7.350–7.450)
pO2, Arterial: 88.3 mmHg (ref 80.0–100.0)

## 2011-03-21 MED ORDER — ALBUTEROL SULFATE (5 MG/ML) 0.5% IN NEBU
2.5000 mg | INHALATION_SOLUTION | Freq: Once | RESPIRATORY_TRACT | Status: AC
  Administered 2011-03-21: 2.5 mg via RESPIRATORY_TRACT

## 2011-03-24 NOTE — Procedures (Signed)
[name], [name]               ACCOUNT NO.:  [id number]  MEDICAL RECORD NO.:  [id number]  LOCATION:                                 FACILITY:  PHYSICIAN:  Edward L. Juanetta Gosling, M.D.DATE OF BIRTH:  02-Dec-1945  DATE OF PROCEDURE: DATE OF DISCHARGE:                           PULMONARY FUNCTION TEST   Reason for pulmonary function testing is shortness of breath. 1. Spirometry shows a mild ventilatory defect with evidence of airflow     obstruction that is most marked at the level of the smaller     airways. 2. Lung volumes are normal. 3. DLCO is mildly reduced. 4. Arterial blood gas is normal. 5. There is no significant bronchodilator improvement. 6. 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     Edward L. Juanetta Gosling, M.D.     ELH/MEDQ  D:  03/23/2011  T:  03/23/2011  Job:  [id number]  cc:   Gerrit Friends. Dietrich Pates, MD, [facility] [address] [place], [place] [address]

## 2011-03-30 ENCOUNTER — Emergency Department (HOSPITAL_COMMUNITY): Payer: Managed Care, Other (non HMO)

## 2011-03-30 ENCOUNTER — Telehealth: Payer: Self-pay | Admitting: *Deleted

## 2011-03-30 ENCOUNTER — Inpatient Hospital Stay (HOSPITAL_COMMUNITY)
Admission: EM | Admit: 2011-03-30 | Discharge: 2011-04-05 | DRG: 378 | Disposition: A | Discharge status: Home / Self Care | Payer: Managed Care, Other (non HMO) | Attending: Internal Medicine | Admitting: Internal Medicine

## 2011-03-30 DIAGNOSIS — I495 Sick sinus syndrome: Secondary | ICD-10-CM | POA: Diagnosis present

## 2011-03-30 DIAGNOSIS — D62 Acute posthemorrhagic anemia: Secondary | ICD-10-CM | POA: Diagnosis present

## 2011-03-30 DIAGNOSIS — Z951 Presence of aortocoronary bypass graft: Secondary | ICD-10-CM

## 2011-03-30 DIAGNOSIS — G5600 Carpal tunnel syndrome, unspecified upper limb: Secondary | ICD-10-CM | POA: Diagnosis present

## 2011-03-30 DIAGNOSIS — E119 Type 2 diabetes mellitus without complications: Secondary | ICD-10-CM | POA: Diagnosis present

## 2011-03-30 DIAGNOSIS — I10 Essential (primary) hypertension: Secondary | ICD-10-CM | POA: Diagnosis present

## 2011-03-30 DIAGNOSIS — Z79899 Other long term (current) drug therapy: Secondary | ICD-10-CM

## 2011-03-30 DIAGNOSIS — G4733 Obstructive sleep apnea (adult) (pediatric): Secondary | ICD-10-CM | POA: Diagnosis present

## 2011-03-30 DIAGNOSIS — Z7901 Long term (current) use of anticoagulants: Secondary | ICD-10-CM

## 2011-03-30 DIAGNOSIS — K31811 Angiodysplasia of stomach and duodenum with bleeding: Principal | ICD-10-CM | POA: Diagnosis present

## 2011-03-30 DIAGNOSIS — J4489 Other specified chronic obstructive pulmonary disease: Secondary | ICD-10-CM | POA: Diagnosis present

## 2011-03-30 DIAGNOSIS — I4891 Unspecified atrial fibrillation: Secondary | ICD-10-CM | POA: Diagnosis present

## 2011-03-30 DIAGNOSIS — Z9861 Coronary angioplasty status: Secondary | ICD-10-CM

## 2011-03-30 DIAGNOSIS — K644 Residual hemorrhoidal skin tags: Secondary | ICD-10-CM | POA: Diagnosis present

## 2011-03-30 DIAGNOSIS — K648 Other hemorrhoids: Secondary | ICD-10-CM | POA: Diagnosis present

## 2011-03-30 DIAGNOSIS — J449 Chronic obstructive pulmonary disease, unspecified: Secondary | ICD-10-CM | POA: Diagnosis present

## 2011-03-30 DIAGNOSIS — D371 Neoplasm of uncertain behavior of stomach: Secondary | ICD-10-CM | POA: Diagnosis present

## 2011-03-30 DIAGNOSIS — K2990 Gastroduodenitis, unspecified, without bleeding: Secondary | ICD-10-CM | POA: Diagnosis present

## 2011-03-30 DIAGNOSIS — Z87891 Personal history of nicotine dependence: Secondary | ICD-10-CM

## 2011-03-30 DIAGNOSIS — E785 Hyperlipidemia, unspecified: Secondary | ICD-10-CM | POA: Diagnosis present

## 2011-03-30 DIAGNOSIS — Z8249 Family history of ischemic heart disease and other diseases of the circulatory system: Secondary | ICD-10-CM

## 2011-03-30 DIAGNOSIS — K2970 Gastritis, unspecified, without bleeding: Secondary | ICD-10-CM | POA: Diagnosis present

## 2011-03-30 DIAGNOSIS — I2510 Atherosclerotic heart disease of native coronary artery without angina pectoris: Secondary | ICD-10-CM | POA: Diagnosis present

## 2011-03-30 DIAGNOSIS — E669 Obesity, unspecified: Secondary | ICD-10-CM | POA: Diagnosis present

## 2011-03-30 DIAGNOSIS — D696 Thrombocytopenia, unspecified: Secondary | ICD-10-CM | POA: Diagnosis present

## 2011-03-30 LAB — DIFFERENTIAL
Basophils Absolute: 0 10*3/uL (ref 0.0–0.1)
Basophils Relative: 1 % (ref 0–1)
Eosinophils Absolute: 0.1 10*3/uL (ref 0.0–0.7)
Eosinophils Relative: 2 % (ref 0–5)
Monocytes Absolute: 0.6 10*3/uL (ref 0.1–1.0)

## 2011-03-30 LAB — URINALYSIS, ROUTINE W REFLEX MICROSCOPIC
Glucose, UA: NEGATIVE mg/dL
Leukocytes, UA: NEGATIVE
pH: 6 (ref 5.0–8.0)

## 2011-03-30 LAB — HEMOGLOBIN: Hemoglobin: 7.9 g/dL — ABNORMAL LOW (ref 13.0–17.0)

## 2011-03-30 LAB — COMPREHENSIVE METABOLIC PANEL
Albumin: 3.6 g/dL (ref 3.5–5.2)
Alkaline Phosphatase: 61 U/L (ref 39–117)
BUN: 18 mg/dL (ref 6–23)
Potassium: 3.9 mEq/L (ref 3.5–5.1)
Sodium: 141 mEq/L (ref 135–145)
Total Protein: 7.2 g/dL (ref 6.0–8.3)

## 2011-03-30 LAB — POCT I-STAT TROPONIN I: Troponin i, poc: 0.03 ng/mL (ref 0.00–0.08)

## 2011-03-30 LAB — POCT I-STAT, CHEM 8
BUN: 19 mg/dL (ref 6–23)
Chloride: 104 mEq/L (ref 96–112)
Creatinine, Ser: 1.1 mg/dL (ref 0.50–1.35)
Potassium: 4 mEq/L (ref 3.5–5.1)
Sodium: 141 mEq/L (ref 135–145)

## 2011-03-30 LAB — CBC
HCT: 28.3 % — ABNORMAL LOW (ref 39.0–52.0)
MCHC: 29.3 g/dL — ABNORMAL LOW (ref 30.0–36.0)
Platelets: 124 10*3/uL — ABNORMAL LOW (ref 150–400)
RDW: 17.6 % — ABNORMAL HIGH (ref 11.5–15.5)

## 2011-03-30 LAB — GLUCOSE, CAPILLARY: Glucose-Capillary: 169 mg/dL — ABNORMAL HIGH (ref 70–99)

## 2011-03-30 LAB — DIGOXIN LEVEL: Digoxin Level: 0.6 ng/mL — ABNORMAL LOW (ref 0.8–2.0)

## 2011-03-30 LAB — PROTIME-INR: Prothrombin Time: 19.8 seconds — ABNORMAL HIGH (ref 11.6–15.2)

## 2011-03-30 NOTE — Telephone Encounter (Signed)
Wife c/o patient being extremely SOB.  Came home this morning & could not hardly make it thru door.  States he is in bed now.  Advised her that if he was having that much difficulty to take to ED or call 911 to be evaluated.  States she has already gotten him an OV with his PMD this afternoon.  Adivsed to definitley be seen by someone today & if worsen in the meantime, to call 911.  She verbalized understanding.

## 2011-03-31 DIAGNOSIS — I4891 Unspecified atrial fibrillation: Secondary | ICD-10-CM

## 2011-03-31 LAB — CBC
HCT: 28.5 % — ABNORMAL LOW (ref 39.0–52.0)
MCHC: 30.2 g/dL (ref 30.0–36.0)
MCV: 83.1 fL (ref 78.0–100.0)
RDW: 17.1 % — ABNORMAL HIGH (ref 11.5–15.5)

## 2011-03-31 LAB — PROTIME-INR
INR: 1.51 — ABNORMAL HIGH (ref 0.00–1.49)
Prothrombin Time: 18.5 seconds — ABNORMAL HIGH (ref 11.6–15.2)

## 2011-03-31 LAB — GLUCOSE, CAPILLARY: Glucose-Capillary: 96 mg/dL (ref 70–99)

## 2011-03-31 LAB — HEMOGLOBIN A1C: Hgb A1c MFr Bld: 5.8 % — ABNORMAL HIGH (ref <5.7)

## 2011-03-31 LAB — HEMATOCRIT: HCT: 30.7 % — ABNORMAL LOW (ref 39.0–52.0)

## 2011-04-01 ENCOUNTER — Other Ambulatory Visit: Payer: Self-pay | Admitting: Gastroenterology

## 2011-04-01 LAB — CBC
HCT: 29.8 % — ABNORMAL LOW (ref 39.0–52.0)
MCH: 25.6 pg — ABNORMAL LOW (ref 26.0–34.0)
MCHC: 31.2 g/dL (ref 30.0–36.0)
RDW: 17.1 % — ABNORMAL HIGH (ref 11.5–15.5)

## 2011-04-01 LAB — GLUCOSE, CAPILLARY
Glucose-Capillary: 124 mg/dL — ABNORMAL HIGH (ref 70–99)
Glucose-Capillary: 96 mg/dL (ref 70–99)
Glucose-Capillary: 131 mg/dL — ABNORMAL HIGH (ref 70–99)

## 2011-04-01 LAB — BASIC METABOLIC PANEL
BUN: 11 mg/dL (ref 6–23)
Chloride: 102 mEq/L (ref 96–112)
Creatinine, Ser: 0.73 mg/dL (ref 0.50–1.35)
GFR calc Af Amer: >60 mL/min (ref >60)
Glucose, Bld: 105 mg/dL — ABNORMAL HIGH (ref 70–99)

## 2011-04-01 LAB — HEMOGLOBIN AND HEMATOCRIT, BLOOD: HCT: 32.9 % — ABNORMAL LOW (ref 39.0–52.0)

## 2011-04-02 DIAGNOSIS — K921 Melena: Secondary | ICD-10-CM

## 2011-04-02 DIAGNOSIS — D6489 Other specified anemias: Secondary | ICD-10-CM

## 2011-04-02 LAB — CBC
HCT: 29.9 % — ABNORMAL LOW (ref 39.0–52.0)
MCHC: 30.1 g/dL (ref 30.0–36.0)
Platelets: 102 10*3/uL — ABNORMAL LOW (ref 150–400)
RDW: 16.9 % — ABNORMAL HIGH (ref 11.5–15.5)

## 2011-04-02 LAB — GLUCOSE, CAPILLARY
Glucose-Capillary: 116 mg/dL — ABNORMAL HIGH (ref 70–99)
Glucose-Capillary: 125 mg/dL — ABNORMAL HIGH (ref 70–99)
Glucose-Capillary: 225 mg/dL — ABNORMAL HIGH (ref 70–99)

## 2011-04-02 LAB — BASIC METABOLIC PANEL
BUN: 12 mg/dL (ref 6–23)
Creatinine, Ser: 0.79 mg/dL (ref 0.50–1.35)
GFR calc Af Amer: >60 mL/min (ref >60)
GFR calc non Af Amer: >60 mL/min (ref >60)
Potassium: 3.8 mEq/L (ref 3.5–5.1)

## 2011-04-03 DIAGNOSIS — K921 Melena: Secondary | ICD-10-CM

## 2011-04-03 DIAGNOSIS — R948 Abnormal results of function studies of other organs and systems: Secondary | ICD-10-CM

## 2011-04-03 DIAGNOSIS — D6489 Other specified anemias: Secondary | ICD-10-CM

## 2011-04-03 LAB — HEMOGLOBIN AND HEMATOCRIT, BLOOD
HCT: 30.2 % — ABNORMAL LOW (ref 39.0–52.0)
Hemoglobin: 9.1 g/dL — ABNORMAL LOW (ref 13.0–17.0)

## 2011-04-03 LAB — CBC
MCHC: 29.8 g/dL — ABNORMAL LOW (ref 30.0–36.0)
Platelets: 100 10*3/uL — ABNORMAL LOW (ref 150–400)
RDW: 17.1 % — ABNORMAL HIGH (ref 11.5–15.5)

## 2011-04-03 LAB — BASIC METABOLIC PANEL
GFR calc Af Amer: >60 mL/min (ref >60)
GFR calc non Af Amer: >60 mL/min (ref >60)
Potassium: 4.3 mEq/L (ref 3.5–5.1)
Sodium: 142 mEq/L (ref 135–145)

## 2011-04-03 LAB — TYPE AND SCREEN
Donor AG Type: NEGATIVE
Donor AG Type: NEGATIVE

## 2011-04-03 LAB — GLUCOSE, CAPILLARY
Glucose-Capillary: 123 mg/dL — ABNORMAL HIGH (ref 70–99)
Glucose-Capillary: 183 mg/dL — ABNORMAL HIGH (ref 70–99)
Glucose-Capillary: 101 mg/dL — ABNORMAL HIGH (ref 70–99)

## 2011-04-04 ENCOUNTER — Telehealth: Payer: Self-pay | Admitting: *Deleted

## 2011-04-04 LAB — GLUCOSE, CAPILLARY: Glucose-Capillary: 131 mg/dL — ABNORMAL HIGH (ref 70–99)

## 2011-04-04 LAB — BASIC METABOLIC PANEL
BUN: 10 mg/dL (ref 6–23)
Calcium: 9 mg/dL (ref 8.4–10.5)
Creatinine, Ser: 0.8 mg/dL (ref 0.50–1.35)
GFR calc Af Amer: >60 mL/min (ref >60)

## 2011-04-04 LAB — CBC
MCH: 24.7 pg — ABNORMAL LOW (ref 26.0–34.0)
MCV: 82.5 fL (ref 78.0–100.0)
Platelets: 105 10*3/uL — ABNORMAL LOW (ref 150–400)
RDW: 17.5 % — ABNORMAL HIGH (ref 11.5–15.5)

## 2011-04-04 LAB — PROTIME-INR: INR: 1.16 (ref 0.00–1.49)

## 2011-04-04 NOTE — Telephone Encounter (Signed)
Left message for pt of normal PFT's [name]

## 2011-04-05 LAB — CBC
Hemoglobin: 10.3 g/dL — ABNORMAL LOW (ref 13.0–17.0)
MCH: 24.8 pg — ABNORMAL LOW (ref 26.0–34.0)
MCV: 82.9 fL (ref 78.0–100.0)
RBC: 4.16 MIL/uL — ABNORMAL LOW (ref 4.22–5.81)
WBC: 4.8 10*3/uL (ref 4.0–10.5)

## 2011-04-05 LAB — GLUCOSE, CAPILLARY: Glucose-Capillary: 136 mg/dL — ABNORMAL HIGH (ref 70–99)

## 2011-04-05 LAB — PROTIME-INR: Prothrombin Time: 15.6 seconds — ABNORMAL HIGH (ref 11.6–15.2)

## 2011-04-06 NOTE — Consult Note (Signed)
NAMESTEPHANOS, [name] NO.:  [id number]  MEDICAL RECORD NO.:  [id number]  LOCATION:  5532                         FACILITY:  [facility]  PHYSICIAN:  Verne Carrow, MDDATE OF BIRTH:  Dec 11, 1945  DATE OF CONSULTATION:  03/31/2011 DATE OF DISCHARGE:                                CONSULTATION   REASON FOR CONSULTATION:  Atrial fibrillation.  PRIMARY CARDIOLOGIST:  [name], MD, [facility]  HISTORY OF PRESENT ILLNESS:  [name] is a pleasant 65 year old Caucasian male with history of chronic persistent atrial fibrillation on chronic Coumadin therapy, coronary artery disease status post three- vessel CABG, hyperlipidemia, hypertension and diabetes mellitus who was admitted to [facility] with weakness and found to be anemic. At this time, the etiology of his anemia is unknown.  He has had several recent episodes of heavy nose bleeding.  The patient tells me that other than weakness, he feels like his normal self.  He has had no chest pain and no shortness of breath.  PAST MEDICAL HISTORY: 1. Diabetes mellitus. 2. Hyperlipidemia. 3. Coronary artery disease status post CABG. 4. Chronic persistent atrial fibrillation on chronic Coumadin therapy     at home. 5. Obstructive sleep apnea. 6. Obesity. 7. Former tobacco abuse. 8. COPD. 9. Hypertension.  PAST SURGICAL HISTORY: 1. Three-vessel CABG. 2. Carpal tunnel surgery. 3. Lipoma removal. 4. Hernia repair.  ALLERGIES:  CARDIZEM, CODEINE and NIACIN.  SOCIAL HISTORY:  The patient tells me that he smoked 1 pack of cigarettes per day for 30 years but quit smoking in 2002.  He occasionally has an alcoholic beverage but does not abuse alcohol.  He has no history of illicit drug abuse.  He is married.  FAMILY HISTORY:  The patient has a family history of coronary artery disease.  CURRENT MEDICATIONS: 1. Digoxin 0.125 mg p.o. once daily. 2. Lasix 80 mg p.o. once daily. 3. Prinivil 5 mg p.o. once  daily. 4. Lopressor 50 mg p.o. twice daily. 5. Protonix 80 mg p.o. b.i.d. 6. Crestor 5 mg p.o. once daily. 7. Verapamil 80 mg p.o. twice daily.  REVIEW OF SYSTEMS:  As stated in the history of present illness is otherwise negative.  PHYSICAL EXAMINATION:  VITAL SIGNS:  Temperature 98.2, pulse 81 and regular, respirations 22 and unlabored, blood pressure 135/82, he is satting 95% on room air. GENERAL:  He is a pleasant middle-aged Caucasian male in no acute distress.  He is alert and oriented x3. SKIN:  Warm and dry. MUSCULOSKELETAL:  Moves all extremities equally. NEUROLOGICAL:  Nonfocal. PSYCHIATRIC:  Mood and affect are appropriate. HEENT:  Normal.  Mucous membranes are moist. NECK:  No JVD.  No carotid bruits.  No thyromegaly.  No lymphadenopathy. LUNGS:  Clear to auscultation bilaterally without wheezes, rhonchi or crackles noted. CARDIOVASCULAR:  Irregular without loud murmurs, gallops or rubs noted. ABDOMEN:  Soft, nontender.  Bowel sounds are present. EXTREMITIES:  No evidence of edema.  Pulses are 2+ in all extremities.  DIAGNOSTIC STUDIES: 1. Laboratory values show hemoglobin of 8.6, platelets 114.  TSH 2.5,     potassium 3.9, creatinine 0.9, INR 1.65. 2. The patient does have heme-positive stools. 3. EKG dated March 30, 2011,  shows atrial fibrillation with rate of     96 beats per minute.  There are nonspecific ST-T wave changes.     There are occasional PVCs.  ASSESSMENT AND PLAN: 1. Chronic persistent atrial fibrillation.  The patient is on home     Coumadin therapy.  However, this is currently being held secondary     to his anemia and possible bleeding.  I agree with holding his     Coumadin at this time.  I would continue his rate control agents     including metoprolol, digoxin and verapamil.  The patient has had     several short pauses on telemetry, however, I would not be     concerned about these pauses at this time in this asymptomatic     patient.  I  would make no changes in his rate control therapy at     this time. 2. Anemia workup per Primary Team.  There is a GI consult pending for     today.  We will be glad to follow this patient with you.  Thank you for the consultation.     Verne Carrow, MD     CM/MEDQ  D:  03/31/2011  T:  03/31/2011  Job:  [id number]  cc:   [name], MD,FACC  Electronically Signed by Verne Carrow MD on 04/06/2011 08:37:23 AM

## 2011-04-08 NOTE — Consult Note (Signed)
[name], [name]               ACCOUNT NO.:  [id number]  MEDICAL RECORD NO.:  [id number]  LOCATION:  5532                         FACILITY:  [facility]  PHYSICIAN:  [name]. [name], MD    DATE OF BIRTH:  15-Feb-1946  DATE OF CONSULTATION:  03/31/2011 DATE OF DISCHARGE:                                CONSULTATION   REASON FOR CONSULTATION:  Symptomatic anemia.  PRIMARY CARE PROVIDER:  [name], MD  CARDIOLOGIST:  [name], MD, [facility]  HISTORY OF PRESENT ILLNESS:  This is a 65 year old gentleman with a past medical history of coronary artery disease status post bypass grafts and a recent cardiac catheterization in July 2012, history of stent placement, diabetes, and chronic atrial fibrillation, was admitted to the hospital with complaints of feeling weak and tired.  He was at his primary care physician's office and at that time, he is noted to have a hemoglobin at 8.6.  Because of his symptomatic anemia, he was subsequently referred to the emergency room for further evaluation and treatment.  He denies having any kind of chest pain and he was evaluated with a cardiac catheterization which revealed 2-vessel coronary artery disease and the recommendations for him to continue with medical management.  He recently reports 2 episodes of epistaxis and it took quite some time in order to stop the bleeding, however, it has not been of any significant issue at this time, in fact these episodes happened approximately 2-3 weeks ago.  No complaints of any melena, although he takes iron supplementation which makes his stools black.  No reports of any hematochezia.  He was admitted to the hospital in January 2012, with symptomatic anemia and heme-positive stool.  At that time, he underwent an EGD and colonoscopy which was performed for me by Dr. Marina Goodell.  Over the weekend, he was identified to have a hyperplastic polyp that was friable and gastric lumen and was successfully removed and  also tubular adenoma in the colon.  There was no evidence of any obvious bleeding at that time.  His hemoglobin remained stable.  He has not any further symptoms until this admission and GI consultation was requested.  PAST MEDICAL HISTORY AND PAST SURGICAL HISTORY:  As stated above with a history of a lipoma surgery, carpal tunnel syndrome surgery, hyperlipidemia, and hernia repair.  FAMILY HISTORY:  Noncontributory.  SOCIAL HISTORY:  Negative for alcohol, tobacco, or illicit drug use.  REVIEW OF SYSTEMS:  As stated above in the history of present illness, otherwise negative.  MEDICATIONS: 1. Digoxin 0.125 mg p.o. daily. 2. Lasix 80 mg p.o. daily. 3. Sliding scale insulin. 4. Lisinopril 5 mg p.o. daily. 5. Metoprolol 50 mg p.o. b.i.d. 6. Protonix 80 mg p.o. b.i.d. 7. Crestor 5 mg p.o. daily. 8. Verapamil 80 mg p.o. b.i.d. 9. Albuterol 2.5 mg inhaler q.2 hours p.r.n. 10.Phenergan 12.5 mg IV q.6 hours p.r.n.  ALLERGIES:  CODEINE, DILTIAZEM, and NIACIN.  PHYSICAL EXAMINATION:  VITAL SIGNS:  Blood pressure is 150/89, heart rate 77, respirations 20, temperature is 98.4. GENERAL:  The patient is in no acute distress, alert and oriented. HEENT:  Normocephalic, atraumatic.  Extraocular muscles intact. NECK:  Supple.  No lymphadenopathy.  LUNGS:  Clear to auscultation bilaterally. CARDIOVASCULAR:  Irregularly irregular. ABDOMEN:  Obese, soft, nontender, nondistended. EXTREMITIES:  No clubbing, cyanosis, or edema.  LABORATORY VALUES:  White blood cell count is 4.6, hemoglobin 8.6, MCV is 83.1, platelets are 114.  Sodium was 140, potassium 4.0, chloride 104, CO2 26, glucose 94, BUN 19, creatinine 1.1.  Hemoglobin A1c is 5.8. BNP is 767 and he is Hemoccult positive.  IMPRESSION: 1. Symptomatic anemia. 2. Heme-positive stool. 3. History of gastrointestinal bleed.  Previously, the source of the     bleeding was not completely identified.  Certainly, he had a     friable polyp that  was removed from a gastric lumen.  Because of     his recurrent anemia and symptoms, it would be prudent to repeat     the EGD and colonoscopy at this time.  Further recommendations will     be made pending the findings.     [name] [name], MD     PDH/MEDQ  D:  03/31/2011  T:  03/31/2011  Job:  [id number]  cc:   [name], M.D. [name], MD,FACC  Electronically Signed by Jeani Hawking MD on 04/08/2011 08:50:02 AM

## 2011-04-15 ENCOUNTER — Ambulatory Visit: Payer: Commercial Indemnity | Admitting: Cardiology

## 2011-04-18 NOTE — H&P (Signed)
NAMEHUXLEY, [name] NO.:  [id number]  MEDICAL RECORD NO.:  [id number]  LOCATION:  [facility]                         FACILITY:  [facility]  PHYSICIAN:  Jonny Ruiz, MD    DATE OF BIRTH:  [birth year]  DATE OF ADMISSION:  03/30/2011 DATE OF DISCHARGE:                             HISTORY & PHYSICAL   PRIMARY CARE PHYSICIAN:  [name], MD  CARDIOLOGIST:  [name], MD, [facility]  CHIEF COMPLAINT:  Severe anemia.  HISTORY OF PRESENT ILLNESS:  The patient is a 65 year old man referred from the office of Dr. Lenise Arena because of worsening anemia.  The patient's hemoglobin has dropped to 8.6 in the office and in the ED, it is noted to be at 8.3.  The patient has generalized weakness and malaise.  He feels he has no energy to hold his razor up in the morning. He denies hematochezia and states that because he takes iron daily, his stools are always black.  He denies nausea, vomiting, or hematemesis. He tells me that 3 weeks ago, he had 1 episode of epistaxis, for which he sought medical attention and was told that it was benign.  No cauterization done.  Two weeks prior to admission, he had another episode of epistaxis which took him about an hour to further bleeding to stop.  The patient denies lightheadedness, dizziness, or syncope. Denies headaches, chest pain, or palpitations.  The patient has been struggling with shortness of breath for quite some time and last month, he underwent pulmonary function tests which revealed a mild ventilatory defect with evidence of air flow obstruction that is most marked at the level of the smaller airways.  The lung volumes were normal.  The DLCO was mildly reduced.  Arterial blood gas normal.  No significant bronchodilator improvement.  The study was consistent with mild COPD (the patient has a history of smoking).  In addition, the patient underwent, on February 02, 2011, cardiac catheterization by Dr. Excell Seltzer that revealed 2-vessel  coronary artery disease involving the LAD and left circumflex, continued patency of the LIMA to LAD, vein graft to diagonal, and free radial graft to OM.  Normal LV function.  The patient was given advice to continue on current medical therapy.  This patient has atrial fibrillation and takes Coumadin, but his PT/INR in the ER is subtherapeutic.  The patient underwent an upper endoscopy as well as colonoscopy on August 09, 2010, and he was found with an ulcerated polyp on upper endoscopy and no active bleeding.  Colonoscopy was unremarkable.  At this point, the patient will be admitted to the hospital for further evaluation of his malaise, fatigue, worsening anemia, and shortness of breath.  PAST MEDICAL HISTORY: 1. Coronary artery disease (see HPI). 2. Chronic atrial fibrillation. 3. Hypertension. 4. Diabetes. 5. Hyperlipidemia. 6. Mild COPD as demonstrated by recent pulmonary function tests. 7. Tobacco use. 8. Hemorrhoids. 9. Carpal tunnel syndrome. 10.Lipoma surgery. 11.Hernia repair.  CURRENT MEDICATIONS: 1. Aspirin 81 mg a day. 2. Fish oil 1200 mg twice a day. 3. Digoxin 0.25 mg once a day. 4. Ferrous sulfate 325 p.o. b.i.d. 5. Lasix 80 mg once a day. 6. Glimepiride 4 mg a day. 7.  Januvia 100 mg a day. 8. Potassium 20 mEq twice a day. 9. Lisinopril 5 mg a day. 10.Lipitor 20 mg once a day. 11.Metformin 1000 mg b.i.d. 12.Metoprolol 50 mg twice a day. 13.Verapamil 80 mg twice a day. 14.Omeprazole 20 mg b.i.d. 15.Warfarin 5 mg a day.  ALLERGIES:  CARDIZEM, CODEINE, COMPAZINE.  SOCIAL HISTORY:  The patient is married with children.  One daughter works here in the hospital.  He works as a Museum/gallery exhibitions officer.  He tells me that he felt so weak today that he had to leave work in the middle of the day.  FAMILY HISTORY:  Mother suffered coronary artery disease, father suffered coronary artery disease as well, and he has a younger sister with coronary artery  disease.  REVIEW OF SYSTEMS:  CONSTITUTIONAL:  He denies fever, chills, night sweats.  Positive for fatigue and malaise.  He continues to gain weight. ENT:  Denies sore throat, runny nose, our earache.  Positive for epistaxis x2 as described in the HPI.  CARDIOVASCULAR:  Denies chest pain.  Positive for dyspnea on exertion.  Denies orthopnea, nocturia, or PND.  RESPIRATORY:  Positive for shortness of breath with exertion.  No cough or wheezing.  GI:  Denies nausea, vomiting, diarrhea, constipation, positive for dark stools which he attributes to taking iron, denies hematochezia or hematemesis.  GU:  Denies hematuria. MUSCULOSKELETAL:  No arthralgias, myalgias, or joint pain.  NEUROLOGIC: Denies focal weakness, numbness, or paresthesias.  LABORATORY ASSESSMENT:  Digoxin 0.6.  Sodium 141, potassium 3.9, chloride 104, carbon dioxide 26, glucose 91, BUN 18, creatinine 0.88, calcium 9.7.  Total protein 7.2, albumin 3.6, AST 31, ALT 21, alk phos 61, bilirubin total 0.7.  WBC 5.5, hemoglobin 8.3, hematocrit 28.3, MCV 83.2, platelet count 124.  Chest x-ray, stable cardiomegaly with postoperative changes.  No acute disease.  INR 1.65, troponin 0.03. EKG, atrial fibrillation with premature ventricular contractions at 100 bpm, nonspecific ST and T-wave changes.  ASSESSMENT AND PLAN: 1. Anemia (hemoglobin 8.3).  History of workup for his anemia earlier     this year with upper and lower endoscopy that revealed an ulcerated     polyp.  Because the patient is taking iron daily, we cannot tell if     the patient has been having melena, explaining his worsening     anemia.  He is on Coumadin, however, his INR is subtherapeutic.     The patient will be admitted to the hospital for further evaluation     with Hemoccult x3, repeat H and H in the morning, and reticulocyte     count as well as B12 and folate.  The patient also had 2 episodes     of epistaxis which could explain the anemia due to blood  loss from     the upper respiratory tract. 2. Dyspnea on exertion might be explained from the fact that the     patient has recently been diagnosed with mild chronic obstructive     pulmonary disease by pulmonary function test.  The patient does not     appear to be in decompensated cardiac condition but for     reassurance, I am going to obtain a B-type natriuretic peptide to     reassess.  His chest x-ray does not to be in failure.  His troponin     is negative and his EKG is unchanged. 3. Atrial fibrillation with subtherapeutic INR.  At this time, I will     put him on heparin  infusion since he is subtherapeutic INR and in     anticipation for any gastrointestinal intervention if needed. 4. Diabetes.  I will discontinue his oral hypoglycemic agents and put     him on glycemic control (SSI order set with no basal insulin).     Obtain hemoglobin A1c. 5. Thrombocytopenia, mild, something to watch.  We will obtain CBC in     2 days. 6. Hypertension.  Continue metoprolol, verapamil, and lisinopril. 7. Hyperlipidemia.  We will switch him from Lipitor to simvastatin 20     mg a day. 8. For prophylaxis, he will be on heparin infusion which should be     sufficient and he will be on PPI with omeprazole 40 mg a day.          ______________________________ Jonny Ruiz, MD     GL/MEDQ  D:  03/30/2011  T:  03/30/2011  Job:  [id number]  cc:   [name], MD,FACC [name], M.D.  Electronically Signed by Jonny Ruiz MD on 04/18/2011 04:11:18 PM

## 2011-04-20 ENCOUNTER — Ambulatory Visit (INDEPENDENT_AMBULATORY_CARE_PROVIDER_SITE_OTHER): Payer: Managed Care, Other (non HMO) | Admitting: Cardiology

## 2011-04-20 ENCOUNTER — Encounter: Payer: Self-pay | Admitting: Cardiology

## 2011-04-20 ENCOUNTER — Ambulatory Visit: Payer: Self-pay | Admitting: Cardiology

## 2011-04-20 VITALS — BP 137/50 | HR 63 | Resp 18 | Ht 68.0 in | Wt 281.0 lb

## 2011-04-20 DIAGNOSIS — I25810 Atherosclerosis of coronary artery bypass graft(s) without angina pectoris: Secondary | ICD-10-CM

## 2011-04-20 DIAGNOSIS — I4891 Unspecified atrial fibrillation: Secondary | ICD-10-CM

## 2011-04-20 DIAGNOSIS — R0602 Shortness of breath: Secondary | ICD-10-CM

## 2011-04-20 DIAGNOSIS — K922 Gastrointestinal hemorrhage, unspecified: Secondary | ICD-10-CM

## 2011-04-20 MED ORDER — ASPIRIN EC 81 MG PO TBEC: 81.0000 mg | DELAYED_RELEASE_TABLET | Freq: Every day | ORAL | Status: AC

## 2011-04-20 NOTE — Assessment & Plan Note (Signed)
Improved after anemia corrected. The patient also remains on iron supplements.

## 2011-04-20 NOTE — Progress Notes (Signed)
HPI The patient is a 65 year old -year-old male with a history of coronary artery disease, status post coronary bypass grafting, multiple cardiac risk factors as well as vein graft stenosis with stent placement in 2006. He has a prior history of GI bleeding in setting of Celebrex. He was seen several months ago and reported dyspnea and the patient was referred for cardiac catheterization. His grafts are patent. Has history of permanent atrial fibrillation and is on chronic Coumadin therapy. Unfortunately a month last cardiac catheterization became very short of breath he had negative pulmonary function studies. 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 He was scheduled for a push enteroscopy for treatment no further bleeding was seen at that time. Aspirin was discontinued and warfarin was resumed. The patient will now have frequent blood counts done per his primary care physician. The shortness of breath is much improved. He has no chest pain. He was returned back to work.  Allergies  Allergen Reactions  . Codeine Other (See Comments)    Hurting in chest  . Diltiazem Hcl     REACTION: itch  . Niacin     REACTION: burns    Current Outpatient Prescriptions on File Prior to Visit  Medication Sig Dispense Refill  . acetaminophen (TYLENOL) 500 MG tablet Take 500 mg by mouth daily as needed.        [name] atorvastatin (LIPITOR) 20 MG tablet Take 20 mg by mouth at bedtime.        . ferrous sulfate 324 (65 FE) MG TBEC Take 1.5 tablets by mouth 2 (two) times daily.        . furosemide (LASIX) 80 MG tablet Take 1 tablet (80 mg total) by mouth daily.  90 tablet  3  . glimepiride (AMARYL) 4 MG tablet Take 4 mg by mouth daily before breakfast.        . lisinopril (PRINIVIL,ZESTRIL) 10 MG tablet Take 1 tablet (10 mg total) by mouth daily.  90 tablet  3  . metFORMIN (GLUCOPHAGE) 1000 MG tablet Take 1,000 mg by mouth 2 (two) times daily with a meal.         . Multiple Vitamin (MULTIVITAMIN) tablet Take 1 tablet by mouth daily.        . nitroGLYCERIN (NITROSTAT) 0.4 MG SL tablet Place 1 tablet (0.4 mg total) under the tongue every 5 (five) minutes as needed for chest pain.  25 tablet  3  . Omega-3 Fatty Acids (FISH OIL) 1200 MG CAPS Take 1 capsule by mouth 2 (two) times daily.        [name] omeprazole (PRILOSEC) 20 MG capsule Take 40 mg by mouth 2 (two) times daily.        . potassium chloride SA (K-DUR,KLOR-CON) 20 MEQ tablet Take 1 tablet (20 mEq total) by mouth 2 (two) times daily.  180 tablet  3  . sitaGLIPtan (JANUVIA) 100 MG tablet Take 100 mg by mouth daily.        . verapamil (CALAN) 80 MG tablet Take 120 mg by mouth 2 (two) times daily.       [name] warfarin (COUMADIN) 5 MG tablet Take 5 mg by mouth as directed.          Past Medical History  Diagnosis Date  . Overweight   . Coronary atherosclerosis of artery bypass graft   . Other and unspecified hyperlipidemia   . Atrial fibrillation   . Shortness of breath   .  Type II or unspecified type diabetes mellitus without mention of complication, not stated as uncontrolled   . OSA (obstructive sleep apnea)   . Insomnia     Past Surgical History  Procedure Date  . Coronary artery bypass graft  10/15/2002     Salvatore Decent. Dorris Fetch, M.D.     . Carpal tunnel release   10/08/2003  . Lipoma surgery     Family History  Problem Relation Age of Onset  . Coronary artery disease      FAMILY HISTORY    History   Social History  . Marital Status: Married    Spouse Name: N/A    Number of Children: N/A  . Years of Education: N/A   Occupational History  . Not on file.   Social History Main Topics  . Smoking status: Former Smoker    Quit date: 07/25/2000  . Smokeless tobacco: Former Neurosurgeon  . Alcohol Use: Yes     Occasionally  . Drug Use: No  . Sexually Active: Not on file   Other Topics Concern  . Not on file   Social History Narrative  . No narrative on file   [id number]  positives as outlined above. The remainder of the 18  point review of systems is negative  PHYSICAL EXAM BP 137/50  Pulse 63  Resp 18  Ht 5\' 8"  (1.727 m)  Wt 281 lb (127.461 kg)  BMI 42.73 kg/m2  General: Well-developed, well-nourished in no distress Head: Normocephalic and atraumatic Eyes:PERRLA/EOMI intact, conjunctiva and lids normal Ears: No deformity or lesions Mouth:normal dentition, normal posterior pharynx Neck: Supple, no JVD.  No masses, thyromegaly or abnormal cervical nodes Lungs: Normal breath sounds bilaterally without wheezing.  Normal percussion Cardiac: irregular rate and rhythm with normal S1 and S2, no S3 or S4.  PMI is normal.  No pathological murmurs Abdomen: Normal bowel sounds, abdomen is soft and nontender without masses, organomegaly or hernias noted.  No hepatosplenomegaly MSK: Back normal, normal gait muscle strength and tone normal Vascular: Pulse is normal in all 4 extremities Extremities: No peripheral pitting edema Neurologic: Alert and oriented x 3 Skin: Intact without lesions or rashes Lymphatics: No significant adenopathy Psychologic: Normal affect   ECG: Not available  ASSESSMENT AND PLAN

## 2011-04-20 NOTE — Assessment & Plan Note (Signed)
The patient will need close followup. At this point I still want to restart aspirin given the fact that the patient has grafts and could be at risk for graft restenosis.

## 2011-04-20 NOTE — Assessment & Plan Note (Signed)
Patent grafts by cardiac catheterization. Resume low-dose aspirin 81 mg by mouth daily

## 2011-04-20 NOTE — Patient Instructions (Signed)
   Resume Aspirin 81mg  daily Your physician wants you to follow up in: 6 months.  You will receive a reminder letter in the mail one-two months in advance.  If you don't receive a letter, please call our office to schedule the follow up appointment

## 2011-04-20 NOTE — Assessment & Plan Note (Signed)
Permanent atrial fibrillation. Will  continue Coumadin for now. The patient will need close followup of the CBC and PT/INR and keep PT/INR returned to 2.5.

## 2011-04-27 ENCOUNTER — Other Ambulatory Visit: Payer: Self-pay | Admitting: *Deleted

## 2011-04-27 MED ORDER — DIGOXIN 250 MCG PO TABS: 250.0000 ug | ORAL_TABLET | Freq: Every day | ORAL | Status: DC

## 2011-04-29 ENCOUNTER — Inpatient Hospital Stay (HOSPITAL_COMMUNITY)
Admission: AD | Admit: 2011-04-29 | Discharge: 2011-05-02 | DRG: 378 | Disposition: A | Discharge status: Home / Self Care | Payer: Managed Care, Other (non HMO) | Source: Ambulatory Visit | Attending: Internal Medicine | Admitting: Internal Medicine

## 2011-04-29 DIAGNOSIS — K2980 Duodenitis without bleeding: Secondary | ICD-10-CM | POA: Diagnosis present

## 2011-04-29 DIAGNOSIS — Z9861 Coronary angioplasty status: Secondary | ICD-10-CM

## 2011-04-29 DIAGNOSIS — I517 Cardiomegaly: Secondary | ICD-10-CM | POA: Diagnosis present

## 2011-04-29 DIAGNOSIS — Z87891 Personal history of nicotine dependence: Secondary | ICD-10-CM

## 2011-04-29 DIAGNOSIS — I10 Essential (primary) hypertension: Secondary | ICD-10-CM | POA: Diagnosis present

## 2011-04-29 DIAGNOSIS — I2510 Atherosclerotic heart disease of native coronary artery without angina pectoris: Secondary | ICD-10-CM | POA: Diagnosis present

## 2011-04-29 DIAGNOSIS — I4891 Unspecified atrial fibrillation: Secondary | ICD-10-CM | POA: Diagnosis present

## 2011-04-29 DIAGNOSIS — D62 Acute posthemorrhagic anemia: Secondary | ICD-10-CM | POA: Diagnosis present

## 2011-04-29 DIAGNOSIS — Z7901 Long term (current) use of anticoagulants: Secondary | ICD-10-CM

## 2011-04-29 DIAGNOSIS — I509 Heart failure, unspecified: Secondary | ICD-10-CM | POA: Diagnosis present

## 2011-04-29 DIAGNOSIS — E785 Hyperlipidemia, unspecified: Secondary | ICD-10-CM | POA: Diagnosis present

## 2011-04-29 DIAGNOSIS — K31811 Angiodysplasia of stomach and duodenum with bleeding: Principal | ICD-10-CM | POA: Diagnosis present

## 2011-04-29 DIAGNOSIS — Z7982 Long term (current) use of aspirin: Secondary | ICD-10-CM

## 2011-04-29 DIAGNOSIS — E119 Type 2 diabetes mellitus without complications: Secondary | ICD-10-CM | POA: Diagnosis present

## 2011-04-29 DIAGNOSIS — Z79899 Other long term (current) drug therapy: Secondary | ICD-10-CM

## 2011-04-29 DIAGNOSIS — Z951 Presence of aortocoronary bypass graft: Secondary | ICD-10-CM

## 2011-04-29 LAB — COMPREHENSIVE METABOLIC PANEL
ALT: 20 U/L (ref 0–53)
AST: 22 U/L (ref 0–37)
Albumin: 3.6 g/dL (ref 3.5–5.2)
CO2: 29 mEq/L (ref 19–32)
Chloride: 102 mEq/L (ref 96–112)
Creatinine, Ser: 0.86 mg/dL (ref 0.50–1.35)
GFR calc non Af Amer: >90 mL/min (ref >90)
Potassium: 3.7 mEq/L (ref 3.5–5.1)
Sodium: 140 mEq/L (ref 135–145)
Total Bilirubin: 0.6 mg/dL (ref 0.3–1.2)

## 2011-04-29 LAB — CBC
HCT: 24.3 % — ABNORMAL LOW (ref 39.0–52.0)
HCT: 24 % — ABNORMAL LOW (ref 39.0–52.0)
Hemoglobin: 7.1 g/dL — ABNORMAL LOW (ref 13.0–17.0)
Hemoglobin: 7.1 g/dL — ABNORMAL LOW (ref 13.0–17.0)
MCH: 23.9 pg — ABNORMAL LOW (ref 26.0–34.0)
MCHC: 29.2 g/dL — ABNORMAL LOW (ref 30.0–36.0)
MCV: 81.8 fL (ref 78.0–100.0)
MCV: 82.2 fL (ref 78.0–100.0)
Platelets: 131 10*3/uL — ABNORMAL LOW (ref 150–400)
RBC: 2.97 MIL/uL — ABNORMAL LOW (ref 4.22–5.81)
RBC: 2.92 MIL/uL — ABNORMAL LOW (ref 4.22–5.81)
RDW: 19.1 % — ABNORMAL HIGH (ref 11.5–15.5)
WBC: 7.6 10*3/uL (ref 4.0–10.5)
WBC: 6.9 10*3/uL (ref 4.0–10.5)

## 2011-04-29 LAB — PROTIME-INR
INR: 2.43 — ABNORMAL HIGH (ref 0.00–1.49)
Prothrombin Time: 26.8 s — ABNORMAL HIGH (ref 11.6–15.2)

## 2011-04-29 LAB — PHOSPHORUS: Phosphorus: 3.3 mg/dL (ref 2.3–4.6)

## 2011-04-29 LAB — DIFFERENTIAL
Basophils Absolute: 0.1 10*3/uL (ref 0.0–0.1)
Basophils Relative: 1 % (ref 0–1)
Eosinophils Absolute: 0.1 10*3/uL (ref 0.0–0.7)
Eosinophils Relative: 2 % (ref 0–5)
Lymphocytes Relative: 19 % (ref 12–46)
Lymphs Abs: 1.3 10*3/uL (ref 0.7–4.0)
Monocytes Absolute: 0.5 10*3/uL (ref 0.1–1.0)
Monocytes Relative: 7 % (ref 3–12)
Neutro Abs: 5 10*3/uL (ref 1.7–7.7)
Neutrophils Relative %: 72 % (ref 43–77)

## 2011-04-29 LAB — APTT: aPTT: 40 s — ABNORMAL HIGH (ref 24–37)

## 2011-04-29 LAB — PRO B NATRIURETIC PEPTIDE: Pro B Natriuretic peptide (BNP): 737.1 pg/mL — ABNORMAL HIGH (ref 0–125)

## 2011-04-30 LAB — CBC
HCT: 26.7 % — ABNORMAL LOW (ref 39.0–52.0)
Hemoglobin: 8.2 g/dL — ABNORMAL LOW (ref 13.0–17.0)
MCH: 25 pg — ABNORMAL LOW (ref 26.0–34.0)
MCHC: 30.7 g/dL (ref 30.0–36.0)
MCV: 81.4 fL (ref 78.0–100.0)
RDW: 18.5 % — ABNORMAL HIGH (ref 11.5–15.5)

## 2011-04-30 LAB — BASIC METABOLIC PANEL
BUN: 19 mg/dL (ref 6–23)
Creatinine, Ser: 0.94 mg/dL (ref 0.50–1.35)
GFR calc non Af Amer: 88 mL/min — ABNORMAL LOW (ref >90)
Glucose, Bld: 146 mg/dL — ABNORMAL HIGH (ref 70–99)
Potassium: 3.7 mEq/L (ref 3.5–5.1)

## 2011-04-30 LAB — GLUCOSE, CAPILLARY: Glucose-Capillary: 132 mg/dL — ABNORMAL HIGH (ref 70–99)

## 2011-05-01 LAB — CROSSMATCH
Antibody Screen: POSITIVE
DAT, IgG: NEGATIVE
Donor AG Type: NEGATIVE
Donor AG Type: NEGATIVE
Unit division: 0

## 2011-05-01 LAB — PROTIME-INR
INR: 2.02 — ABNORMAL HIGH (ref 0.00–1.49)
Prothrombin Time: 23.2 seconds — ABNORMAL HIGH (ref 11.6–15.2)

## 2011-05-01 LAB — CBC
MCH: 24.9 pg — ABNORMAL LOW (ref 26.0–34.0)
MCHC: 30.3 g/dL (ref 30.0–36.0)
Platelets: 116 10*3/uL — ABNORMAL LOW (ref 150–400)
RBC: 3.33 MIL/uL — ABNORMAL LOW (ref 4.22–5.81)

## 2011-05-01 LAB — GLUCOSE, CAPILLARY: Glucose-Capillary: 128 mg/dL — ABNORMAL HIGH (ref 70–99)

## 2011-05-02 ENCOUNTER — Ambulatory Visit: Payer: Commercial Indemnity | Admitting: Cardiology

## 2011-05-02 LAB — CBC
MCH: 25 pg — ABNORMAL LOW (ref 26.0–34.0)
MCV: 81.5 fL (ref 78.0–100.0)
Platelets: 117 10*3/uL — ABNORMAL LOW (ref 150–400)
RDW: 18.1 % — ABNORMAL HIGH (ref 11.5–15.5)
WBC: 6.3 10*3/uL (ref 4.0–10.5)

## 2011-05-02 LAB — GLUCOSE, CAPILLARY
Glucose-Capillary: 124 mg/dL — ABNORMAL HIGH (ref 70–99)
Glucose-Capillary: 83 mg/dL (ref 70–99)

## 2011-05-02 LAB — BASIC METABOLIC PANEL
BUN: 13 mg/dL (ref 6–23)
Calcium: 8.9 mg/dL (ref 8.4–10.5)
GFR calc non Af Amer: >90 mL/min (ref >90)
Glucose, Bld: 137 mg/dL — ABNORMAL HIGH (ref 70–99)

## 2011-05-03 NOTE — Consult Note (Signed)
[name], [name]               ACCOUNT NO.:  [id number]  MEDICAL RECORD NO.:  [id number]  LOCATION:  4707                         FACILITY:  [facility]  PHYSICIAN:  [name]. [name], MD    DATE OF BIRTH:  [birth year]  DATE OF CONSULTATION:  04/29/2011 DATE OF DISCHARGE:                                CONSULTATION   REASON FOR CONSULTATION:  GI bleed.  PRIMARY CARE PROVIDER:  [name], MD  CARDIOLOGIST:  [name], MD, [facility]  HISTORY OF PRESENT ILLNESS:  This is a 65 year old gentleman with a past medical history of coronary artery disease status post bypass graft; cardiac catheterization in July 2012; history of stent placement; diabetes; chronic atrial fibrillation, on Coumadin and recurrent GI bleed who was admitted to the hospital for a symptomatic anemia.  The patient started feeling pretty weak and fatigued for the past 2 days and subsequently presented to his primary care provider, Dr. Lenise Arena.  At that point, he was identified to have hemoglobin in the 8 range and previously from his hospital discharge he was in the 12 range.  The patient reports having melena for the past 2 weeks and incidentally he was seen in my office on April 18, 2011 for his recent hospitalization with GI bleed.  During this most recent hospitalization, he underwent a repeat EGD and colonoscopy as well as a capsule endoscopy with no overt findings of any GI bleeding.  He had a similar episode back in January with no evidence of any bleeding.  The plan was to reevaluate the patient with a capsule endoscopy on Coumadin if the bleeding recurred.  It appears that this is the situation at this time. He denies having any chest pain, but he does have some shortness of breath.  As a result of his symptoms, he was admitted to the hospital for further evaluation and treatment.  PAST MEDICAL AND PAST SURGICAL HISTORY:  As stated above with the history of carpal tunnel surgery, lipoma status post  lipoma resection, hiatal hernia repair and hyperlipidemia.  FAMILY HISTORY:  Noncontributory.  SOCIAL HISTORY:  Negative for alcohol, tobacco, or illicit drug use.  REVIEW OF SYSTEMS:  As stated above in the history of present illness, otherwise negative.  MEDICATIONS: 1. Protonix 40 mg IV daily. 2. Hydrocodone 1-2 tablets p.o. q.4 h. p.r.n. 3. Zofran 4 mg IV q.6 h. p.r.n. 4. Phenergan 6.25 mg IV q.6 h. p.r.n. 5. Ambien 5 mg p.o. at bedtime.  ALLERGIES: 1. CODEINE. 2. DILTIAZEM. 3. NIACIN.  PHYSICAL EXAMINATION:  VITAL SIGNS:  Blood pressure is 138/84, heart rate is 75, respirations 20, temperature is 98.4. GENERAL:  The patient is in no acute distress, alert and oriented. HEENT:  Normocephalic, atraumatic.  Extraocular muscles intact. NECK:  Supple.  No lymphadenopathy. LUNGS:  Clear to auscultation bilaterally. CARDIOVASCULAR:  Regular rate and rhythm. ABDOMEN:  Obese, soft, nontender, nondistended. EXTREMITIES:  No clubbing, cyanosis, or edema. RECTAL:  Positive for melena and heme positive.  LABORATORY VALUES:  White blood cell count 6.9, hemoglobin 7.1, MCV is 82.2, platelets at 128.  PT is 26.8, INR 2.4.  Sodium 140, potassium 3.7, chloride 102, CO2 of 29, glucose 110, BUN 26,  creatinine 0.8. Total bilirubin is 0.6, alk phos 54, AST 22, ALT 20, albumin is 3.6, calcium 9.7.  BNP is 737.1.  IMPRESSION: 1. Gastrointestinal bleed. 2. Heme-positive stool. 3. Symptomatic anemia.  At this time, it appears that the patient is actively bleeding on the Coumadin and as I had stated to the patient before, a repeat evaluation of his GI tract will be performed while he is on Coumadin.  I think in this incidence repeating the capsule endoscopy is the first and foremost procedure as they can cover the upper GI tract as well as small intestine.  Hopefully, a site of bleeding can be identified and subsequently treated from endoscopic standpoint.  PLAN: 1. At this time is to  schedule for capsule endoscopy for tomorrow. 2. Transfuse 2 units of packed red blood cells.     [name] [name], MD     PDH/MEDQ  D:  04/29/2011  T:  04/30/2011  Job:  [id number]  Electronically Signed by Jeani Hawking MD on 05/03/2011 12:17:58 PM

## 2011-05-04 NOTE — Discharge Summary (Signed)
  NAMEDEREKE, NEUMANN NO.:  [id number]  MEDICAL RECORD NO.:  [id number]  LOCATION:  5532                         FACILITY:  [facility]  PHYSICIAN:  Baltazar Najjar, MD     DATE OF BIRTH:  1946-06-05  DATE OF ADMISSION:  03/30/2011 DATE OF DISCHARGE:  04/05/2011                              DISCHARGE SUMMARY   ADDENDUM  On the eve of Mr. Schreier discharge, some medication adjustments were made to his blood pressure medication and his digoxin.  Consequently, the following is his new discharge medication list. 1. Digoxin 0.25 mg 1 tablet by mouth daily. 2. Lisinopril 5 mg 1 tablet by mouth twice daily. 3. Metoprolol tartrate 50 mg 1 tablet by mouth twice daily. 4. Ferrous sulfate 325 mg one and half tablets twice daily. 5. Fish oil 1200 mg 1 tablet by mouth twice daily. 6. Glimepiride 4 mg 1 tablet by mouth daily at bedtime. 7. Januvia 100 mg 1 tablet by mouth daily. 8. Klor-Con 20 mEq 1 tablet by mouth twice daily. 9. Lasix 80 mg 1 tablet by mouth daily. 10.Lipitor 20 mg 1 tablet by mouth daily. 11.Metformin 500 mg 1 tablet by mouth twice daily. 12.Multivitamin 1 tablet by mouth daily. 13.Omeprazole 40 mg 1 tablet by mouth twice daily. 14.Tylenol Extra Strength 500 mg specialized dosing, the patient takes     4 tablets a day. 15.Verapamil 80 mg one and half capsules by mouth twice daily. 16.Warfarin 5 mg one and half tablets daily to be adjusted by Dr.     [name].     Stephani Police, PA   ______________________________ Baltazar Najjar, MD    MLY/MEDQ  D:  04/05/2011  T:  04/05/2011  Job:  [id number]  Electronically Signed by Algis Downs PA on 04/05/2011 07:54:35 PM Electronically Signed by Hannah Beat MD on 05/04/2011 08:23:33 PM

## 2011-05-04 NOTE — Discharge Summary (Signed)
[name], Matthew NO.:  [id number]  MEDICAL RECORD NO.:  [id number]  LOCATION:                                 FACILITY:  PHYSICIAN:  Baltazar Najjar, MD     DATE OF BIRTH:  Feb 19, 1946  DATE OF ADMISSION:  03/26/2011 DATE OF DISCHARGE:  04/05/2011                              DISCHARGE SUMMARY   PRIMARY CARE PHYSICIAN:  [name], MD  GASTROENTEROLOGIST:  [name]. [name], MD  CARDIOLOGIST:  [name], MD,FACC with [facility].  DISCHARGE DIAGNOSES: 1. 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 2. Acute blood loss anemia status post 1 unit packed red blood cells. 3. 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 4. Atrial fibrillation on chronic Coumadin.  The rest of his discharge diagnoses are consistent with his past medical history and include: 1. Type 2 diabetes well controlled. 2. Hyperlipidemia. 3. Coronary artery disease status post coronary artery bypass graft. 4. Hyperlipidemia. 5. Mild chronic obstructive pulmonary disease is demonstrated on     recent pulmonary function tests. 6. Hemorrhoids. 7. Carpal tunnel syndrome. 8. Hernia repair. 9. Lipoma surgery.  CONSULTANTS: 1. On September 6, the patient was seen by Dr. Jeani Hawking for GI     bleeding. 2. On September 6, the patient was seen by Dr. Clifton James of [facility] for chronic atrial fibrillation.  HISTORY AND BRIEF HOSPITAL COURSE:  [name] is a very pleasant 65 year old obese Caucasian male who came from Dr. Titus Mould office because of worsening anemia.  The patient's hemoglobin had dropped to 8.6 in the office and in the ED it was noted to be at 8.3.  The patient had generalized weakness, malaise, and shortness of breath.  At the time of admission, he denied any bright red blood per rectum. Reported his stools were always dark because he stays on iron.  He had had several episodes of  epistaxis over the last couple of weeks.  The patient was admitted to the [facility] for further evaluation and workup.  Consultations were called with both Gastroenterology and Cardiology.  With regards to his GI bleeding, the patient had had a recent colonoscopy just 6 months prior.  Consequently, Dr. [name] ordered an upper endoscopy.  This was performed on approximately September 7. Findings included hiatal hernia but no explanation for his GI bleeding. Dr. [name] then moved forward with colonoscopy also done on September 7. On exam he found 1 sessile polyp, and both internal and external hemorrhoids, but still no source of bleeding.  His recommendation was to move forward with a capsule endoscopy.  Note results of the pathology of the sessile polyp that was removed during the colonoscopy have not returned yet. The patient underwent capsule endoscopy on September 8.  The study was complete.  Duodenal bulb erosions were found.  Also 2 separate AVMs, 1 at 10:45 minutes and 1 at 11:38 minutes were found to be actively oozing.  These were also felt to be located in the duodenum.  Also the patient had prominent blue venous structure in the colon.  Dr. Erick Blinks, of [facility],  recommended the patient move forward with small bowel push enteroscopy to treat the duodenal AVMs.  Small bowel push enteroscopy was performed by Dr. Rhea Belton on September 9.  Impression: 1. Normal esophagus. 2. Mild gastritis in the gastric cardia. 3. Moderate gastritis in the antrum. 4. Normal duodenum. 5. Normal jejunum in the entire area examined.  Dr. Lauro Franklin recommendation was to resume warfarin and continue Protonix.  If the anemia fails to resolve or melena returns, the patient could be considered for referral for a deeper small bowel enterostomy to examine in the mid jejunum.  During his stay [name] hemoglobin rose with transfusion to a high of 10.3.  Then over the next several days  slowly dropped to 8.9 on September 9.  Between September 9 and September 11, his hemoglobin has risen on its own and today at the time of discharge it is 10.3.  The patient did have 1 episode of bright red blood per rectum.  This was on Thursday, however, since then he has had no bloody stools and no melenic looking stools.  His stools at this point in time are solid and brown in color.  Arrhythmia including atrial fibrillation as well as tachybrady syndrome and pulses on the tele monitor.  The patient's Coumadin was held at the beginning of his hospitalization secondary to GI bleeding.  The patient was seen in consultation by Dr. Clifton James who concurred with his diagnosis of chronic atrial fibrillation that is persistent.  He noted that Coumadin had to be held because of his GI bleeding and anemia.  He recommended that the patient's rate control agents to be continued including metoprolol, digoxin, and verapamil.  He also noted some pauses on telemetry.    Indeed the patient had pauses occasionally throughout his hospital stay on telemetry.  None were greater than 3 seconds.  Most were approximately 2 to 2.2 seconds.  Cardiology continued to follow the patient throughout his hospitalization.  On September 9, the patient developed bradycardia going down into the 30s.  On September 10, he developed tachycardia, escalating up into the 150s, worse with movement. Cardiology was reconsulted and the patient was seen by both Dr. Dietrich Pates and Dr. Juanito Doom who diagnosed the patient with tachybrady syndrome.  However, there is no indication for a pacer. At this point his Coumadin has been restarted.  There is no need for a Lovenox bridge. Cardiology advised the patient needed to be out of work until he is reevaluated in [place] by his cardiologist early next week.  At this point in time, the patient appears well.  His stools are brown. He is asking for discharge to home.  He is eating well, denies  any pain. He is asymptomatic from his arrhythmias.  He is no longer dizzy or short of breath.  He will be discharged to home in stable condition today.  PHYSICAL EXAMINATION:  GENERAL:  The patient is alert and oriented, sitting up in his chair.  He is in no apparent distress. VITAL SIGNS:  Temperature 97.9, pulse 80, respirations 20, blood pressure 121/79, O2 sats 97% on room air.  Telemonitor shows chronic atrial fibrillation. HEAD:  Atraumatic, normocephalic.  Eyes are anicteric with pupils that are equal, round, reactive to light.  Nose shows no nasal discharge or exterior lesions.  Mouth has moist mucous membranes with good dentition. NECK:  Supple with midline trachea.  No JVD.  No lymphadenopathy. CHEST:  Demonstrates no accessory muscle use.  He has no wheezes or crackles to  my exam. HEART:  Has an irregular rhythm with a regular rate.  No obvious murmurs, rubs, or gallops. ABDOMEN:  Obese, nontender, nondistended with bowel sounds. EXTREMITIES:  Show no swelling, cyanosis, or edema.  He is able to stand up and move about the room without difficulty.  However, when he does this, his pulse rate does climb noticeably into the low 100s.  PERTINENT LABS THIS ADMISSION:  Today the day of discharge the patient's white count 4.8, hemoglobin 10.3, hematocrit 34.5, MCV value 82.9, platelets 111,000.  Pro time 15.6, INR 1.21, glucose 136.  Also during this hospitalization, the patient had a hemoglobin A1c that was 5.8 with a mean plasma glucose of 120.  Stool guaiacs were positive.  RADIOLOGICAL EXAM:  The patient had a chest x-ray on September 5 that showed stable cardiomegaly and postop change, no acute disease.  DISCHARGE MEDICATIONS: 1. The patient's metoprolol has been changed.  It is now 50 mg 1     tablet by mouth twice daily. 2. Digoxin 0.125 mg 1 tablet by mouth daily. 3. Iron 325 mg 1-1/2 tablet by mouth twice daily. 4. Fish oil 1200 mg 1 tablet by mouth twice daily. 5.  Glimepiride 4 mg 1 tablet by mouth daily at bedtime. 6. Januvia 100 mg 1 tablet by mouth daily. 7. Klor-Con 20 mEq 1 tablet by mouth twice daily. 8. Lasix 80 mg 1 tablet by mouth daily. 9. Lipitor 20 mg 1 tablet by mouth daily. 10.Lisinopril 5 mg 1 tablet by mouth daily. 11.Metformin 500 mg 1 tablet by mouth twice daily. 12.Multivitamin 1 tablet by mouth daily. 13.Omeprazole 40 mg 1 tablet by mouth twice daily. 14.Tylenol Extra Strength 500 mg.  The patient has special dosing, he     takes 4 tablets per day daily. 15.Verapamil 80 mg 1-1/2 capsules by mouth twice daily. 16.Warfarin 5 mg.  The patient takes 1 to 1-1/2 tablets by mouth     daily.  Dosing is directed by his primary care physician Dr.     [name].  DISCHARGE INSTRUCTIONS:  The patient will be discharged to home today. He can return to work after being cleared by Cardiology.  Activity level is to be increased slowly and he is to monitor himself for signs of dizziness or shortness of breath.  He knows to call his doctor or come to the emergency room if he has these symptoms.  FOLLOWUP APPOINTMENTS: 1. Dr. Jeani Hawking, September 24. 2. Dr. Lenise Arena, September 13 at 11:00 a.m.  This will be for Coumadin     check as well as hospital followup. 3. He currently has an appointment with Dr. Andee Lineman of Cardiology,     September 26 at 10:30 a.m., however, Dr. Tenny Craw and Dr. Daleen Squibb have     committed to schedule an EP cardiology appointment for the patient     early next week in [place].     Matthew Police, PA   ______________________________ Baltazar Najjar, MD    MLY/MEDQ  D:  04/05/2011  T:  04/05/2011  Job:  [id number]  cc:   [name], M.D. [name] [name], MD [name], MD,FACC  Electronically Signed by Matthew Downs PA on 04/05/2011 08:05:28 PM Electronically Signed by Hannah Beat MD on 05/04/2011 08:24:21 PM

## 2011-05-05 NOTE — Discharge Summary (Signed)
NAMESHELLEY, POOLEY               ACCOUNT NO.:  [id number]  MEDICAL RECORD NO.:  [id number]  LOCATION:  4707                         FACILITY:  [facility]  PHYSICIAN:  Manson Passey, MD        DATE OF BIRTH:  01-20-1946  DATE OF ADMISSION:  04/29/2011 DATE OF DISCHARGE:  05/02/2011                              DISCHARGE SUMMARY   PRIMARY CARE PHYSICIAN:  [name], MD, at [facility].  PRIMARY GASTROENTEROLOGIST:  [name]. [name], MD  PRIMARY CARDIOLOGIST:  [name], MD, [facility]  DISCHARGE DIAGNOSES: 1. Acute upper GE bleed. 2. Heme-positive stool. 3. Symptomatic anemia/acute blood loss anemia. 4. Hypertension. 5. Diabetes. 6. Dyslipidemia. 7. Congestive heart failure status post triple bypass in 2004. 8. Atrial fibrillation on anticoagulation. 9. Status post stent placement.  DISCHARGE MEDICATIONS: 1. Aspirin 81 mg daily. 2. Benzonatate 100 mg 3 times a day. 3. Digoxin 0.25 mg daily. 4. Ferrous sulfate 375 mg twice daily. 5. Fish oil 1.2 g twice daily. 6. Glimepiride (Amaryl) 4 mg daily at bedtime. 7. Januvia 100 mg daily. 8. Chlor-Con 20 mEq twice daily. 9. Lasix 80 mg daily. 10.Lipitor 20 mg daily. 11.Lisinopril 10 mg daily. 12.Metformin 1000 mg twice daily. 13.Metoprolol 50 mg twice daily. 14.Multivitamin daily. 15.Omeprazole 40 mg twice daily. 16.Tylenol Extra Strength 500 mg 2 tablets twice daily. 17.Verapamil 80 mg twice daily. 18.Coumadin 5 mg to dosage 7.5 mg on Tuesdays and Thursdays. 19.Coumadin 5 mg on Monday, Wednesday, Friday, Saturday, and Sunday.  CONSULTS:  Gastroenterology.  DIAGNOSTIC STUDIES: 1. Chest x-ray on April 29, 2011, shows stable cardiomegaly and     postoperative changes with no acute disease. 2. Capsule endoscopy on May 02, 2011, which shows erosions in the     stomach with questionable AVMs in proximal small bowel, duodenitis     involved, lapse of debris in the small bowel-lesions could be     missed. 3. EGD on  May 02, 2011, with findings of duodenal AVMs.  DISCHARGE LABORATORY:  Sodium 139 potassium 3.4, chloride 103, bicarb 26, BUN 13, creatinine 0.83, glucose 137, calcium 8.9, INR 2.  White blood cells 6.3, hemoglobin 8.8, hematocrit 28.7, platelet count 117.  HISTORY OF PRESENT ILLNESS:  The patient is a pleasant 65 year old male with multiple comorbidities which include CHF status post triple bypass, hypertension, diabetes, dyslipidemia, upper GI bleed history, AFib on anticoagulation, who was sent from primary care physician's office for low hemoglobin level.  As per patient, hemoglobin level was 8 which is lower than his usual baseline as per patient.  The patient has had a history of upper GI bleed, and he is status post EGD as well as capsule endoscopy.  The source of bleed was never really identified and this time coming to emergency room, GI consult was sought.  Capsule enteroscopy was repeated as well as EGD, but at this time, the patient was continued on Coumadin and aspirin to help visualize the source of bleed.  The patient was admitted on the Hospitalist Service for further management.  PHYSICAL EXAMINATION:  VITAL SIGNS:  Blood pressure 166/84, pulse 73, respirations 20, temperature 98.6 Fahrenheit, oxygen saturation 99% on room air. GENERAL APPEARANCE:  No  acute distress, the patient appears comfortable. HEENT:  Normocephalic/atraumatic.  Pupils equally round and reactive to light and accommodation.  Extraocular muscles intact.  No tonsillar erythema or exudate. NECK:  Supple.  No lymphadenopathy. SKIN: Warm, dry. LUNGS:  Clear to auscultate clear to auscultation bilaterally.  No wheezes, no rhonchi, or rales. CARDIOVASCULAR:  Positive S1, S2, irregular rhythm, but rate controlled. ABDOMEN:  Positive bowel sounds, soft, nontender/nondistended. EXTREMITIES:  Pulses palpable bilaterally with +1 lower extremity edema. NEUROLOGICAL:  Alert, awake, oriented x3.  No focal  neurologic deficits.  HOSPITAL COURSE BY PROBLEM: 1. 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  The patient has capsule     endoscopy as well as EGD on this admission, done by Dr. [name].  The     patient is safe to be discharged home to follow up with Dr. [name] in     about 1 week and recheck hemoglobin/hematocrit level.  The patient     can resume taking anticoagulants at home on discharge and proton     pump inhibitors as per home regimen.  Acute blood loss anemia     secondary to upper GI bleed.  Hemoglobin and hematocrit are stable     on discharge around 8.8 for hemoglobin.  The patient is status post     2 units of blood, PRBC.  The patient will follow up in an     outpatient setting to recheck his hemoglobin and hematocrit. 2. Atrial fibrillation.  The patient is on anticoagulation at home, so     Coumadin can be continued in the same way as it was prior to     admission.  INR on discharge is 2. 3. Hypertension.  Please continue the same home medications as prior     to admission. 4. Dyslipidemia.  Please continue Lipitor 20 mg at bedtime. 5. Diabetes.  Patient can continue metformin 1 g twice daily, as well     as Januvia 100 mg daily.  CONDITION ON DISCHARGE:  Patient is stable medically and clinically appears well for discharge.  Time spent discharging is more than 35 minutes.          ______________________________ Manson Passey, MD     AD/MEDQ  D:  05/02/2011  T:  05/03/2011  Job:  [id number]  Electronically Signed by Manson Passey MD on 05/05/2011 09:41:56 AM

## 2011-05-05 NOTE — H&P (Signed)
NAMEBRENN, Matthew NO.:  [id number]  MEDICAL RECORD NO.:  [id number]  LOCATION:  4707                         FACILITY:  [facility]  PHYSICIAN:  Manson Passey, MD        DATE OF BIRTH:  09-20-1945  DATE OF ADMISSION:  04/29/2011 DATE OF DISCHARGE:                             HISTORY & PHYSICAL   PRIMARY CARE PHYSICIAN:  [name], MD, at [facility]  PRIMARY GASTROENTEROLOGIST:  [name]. [name], MD  PRIMARY CARDIOLOGIST:  [name], MD, [facility]  CHIEF COMPLAINT:  Low hemoglobin level.  HISTORY OF PRESENT ILLNESS:  This is a 65 year old male with history of CHF, status post triple bypass in 2004, hypertension, diabetes, dyslipidemia, AFib on anticoagulation, status post stent in 2006 who was brought from PCP office because of low hemoglobin level.  As per the patient, he had an endoscopy in January 2012 because of the upper GI bleed and at which point bleeding was cauterized.  Again, the episode of bleed started in September 2012 and at that time the patient reports having capsule endoscopy, which has showed some bleed in the small intestine.  However, no clear source was identified.  Today, the patient was at the PCP office and was told that his hemoglobin level was low about 8 and he was sent to [facility] for further evaluation and management.  The patient reports having dark stool which has been chronic.  He also reports feeling dizzy earlier today, however, at present the patient reports no lightheadedness or dizziness, no loss of consciousness, no blurry vision.  The patient reports no chest pain or palpitations, no cough or fever or chills.  The patient also denies nausea or vomiting or blood in the stool.  The patient denies dysuria or hematuria.  No reports of unintentional weight loss.  The patient is admitted to the Hospitalist team for further evaluation and management.  REVIEW OF SYSTEMS:  As per HPI.  ALLERGIES: 1. NIACIN. 2.  CODEINE. 3. DILTIAZEM.  FAMILY HISTORY:  Significant for son having diabetes, as per patient diabetes runs in the family.  SOCIAL HISTORY:  The patient denies alcohol use or illegal drug use, but reports quitting smoking 11 years ago.  PAST MEDICAL HISTORY: 1. CHF, status post triple bypass in 2004. 2. Hypertension. 3. Diabetes. 4. Dyslipidemia. 5. AFib, on anticoagulation with Coumadin. 6. Status post stent placement in 2006. 7. History of GI bleed.  PAST SURGICAL HISTORY: 1. Triple bypass. 2. Stent placement. 3. Surgery for carpal tunnel syndrome. 4. Hernia repair.  MEDICATIONS AT HOME: 1. Tylenol 500 mg 2 tablets twice daily. 2. Fish oil 1200 mg twice daily. 3. Multivitamin daily. 4. Digoxin 0.25 mg daily. 5. Ferrous sulfate 325 mg 1-1/2 tablets twice daily. 6. Lasix 80 mg daily. 7. Amaryl 4 mg daily at bedtime. 8. Januvia 100 mg daily. 9. Klor-Con 20 mEq 1 tablet twice daily. 10.Lisinopril 10 mg daily. 11.Lipitor 20 mg daily at bedtime. 12.Metformin 1000 mg twice daily. 13.Metoprolol 50 mg twice daily. 14.Omeprazole 40 mg twice daily. 15.Benzonatate 100 mg 3 times a day. 16.Verapamil 80 mg 1-1/2 capsules twice daily. 17.Coumadin 7.5 mg on Tuesdays and Thursdays. 18.Coumadin 5  mg on Monday, Wednesday, Friday, Saturday, and Sunday. 19.Aspirin 81 mg daily.  PHYSICAL EXAMINATION:  VITAL SIGNS:  Blood pressure 138/84, pulse 75, respirations 20, temperature 98.4, and oxygen saturation 97% on room air. GENERAL APPEARANCE:  No acute distress, the patient appears comfortable. SKIN:  Warm, dry. HEENT:  Pupils are equally round and reactive to light and accommodation.  Extraocular muscles intact.  Pale sclerae.  No tonsillar exudates or erythema. NECK:  Supple.  No lymphadenopathy, no JVD, no carotid bruits appreciated. LUNGS:  Clear to auscultation bilaterally.  No rales, no rhonchi, no wheezing. CARDIOVASCULAR:  Positive S1 and S2, irregular rhythm, but  rate controlled.  Positive 2-3/6 systolic murmur. ABDOMEN:  Positive bowel sounds.  Soft, nontender/nondistended. EXTREMITIES:  Pulses palpable bilaterally over lower extremities left and right.  Trace lower extremity edema.  Ecchymotic area over right lower extremity, as per the patient present for a couple of years. NEUROLOGICAL:  Alert, awake, and oriented x3; no focal neurologic deficits.  LABORATORY:  White blood cell 7.6, hemoglobin 7.1, hematocrit 24.3, and platelet count 131.  IMPRESSION: 1. Upper gastrointestinal bleed, unclear etiology at present.  The     patient was advised by primary GI to continue Coumadin as well as     aspirin on admission so to help identify the source of bleed.  We     will keep the patient n.p.o. for now and continue IV fluids and     normal saline at 100 mL an hour.  Hemoglobin at present is 7.1.  We     will repeat CBC around 8 p.m. today and if there is a further drop     in hemoglobin we will transfuse 2 units of blood and recheck CBC in     the morning. 2. Hypertension, well controlled on home medications, so on this     admission we will continue lisinopril as well as metoprolol for     blood pressure control. 3. Diabetes.  We will get A1c level.  We will continue Amaryl and     Januvia on the admission. 4. Congestive heart failure.  We will continue digoxin and Lasix 80 mg     daily.  Strict Is and Os.  Daily weights.  Check BNP. 5. History of atrial fibrillation.  We will continue Coumadin as per     home regimen 7.5 mg on Tuesday and Thursday and 5 mg on Monday,     Wednesday, Friday, Saturday, and Sunday. 6. Diet.  Keep the patient n.p.o. for now. 7. Advance directives.  The patient is full code. 8. Education.  The patient and family are aware of plan of care and     treatment.  Time spent admitting more than 35 minutes.          ______________________________ Manson Passey, MD     AD/MEDQ  D:  04/29/2011  T:  04/30/2011  Job:   [id number]  Electronically Signed by Manson Passey MD on 05/05/2011 09:41:54 AM

## 2011-05-09 ENCOUNTER — Inpatient Hospital Stay (HOSPITAL_COMMUNITY)
Admission: AD | Admit: 2011-05-09 | Discharge: 2011-05-13 | DRG: 378 | Disposition: A | Discharge status: Home / Self Care | Payer: Managed Care, Other (non HMO) | Source: Ambulatory Visit | Attending: Internal Medicine | Admitting: Internal Medicine

## 2011-05-09 DIAGNOSIS — Z886 Allergy status to analgesic agent status: Secondary | ICD-10-CM

## 2011-05-09 DIAGNOSIS — I10 Essential (primary) hypertension: Secondary | ICD-10-CM | POA: Diagnosis present

## 2011-05-09 DIAGNOSIS — Z888 Allergy status to other drugs, medicaments and biological substances status: Secondary | ICD-10-CM

## 2011-05-09 DIAGNOSIS — D696 Thrombocytopenia, unspecified: Secondary | ICD-10-CM | POA: Diagnosis present

## 2011-05-09 DIAGNOSIS — E785 Hyperlipidemia, unspecified: Secondary | ICD-10-CM | POA: Diagnosis present

## 2011-05-09 DIAGNOSIS — I4891 Unspecified atrial fibrillation: Secondary | ICD-10-CM | POA: Diagnosis present

## 2011-05-09 DIAGNOSIS — Z7982 Long term (current) use of aspirin: Secondary | ICD-10-CM

## 2011-05-09 DIAGNOSIS — K31811 Angiodysplasia of stomach and duodenum with bleeding: Principal | ICD-10-CM | POA: Diagnosis present

## 2011-05-09 DIAGNOSIS — Z951 Presence of aortocoronary bypass graft: Secondary | ICD-10-CM

## 2011-05-09 DIAGNOSIS — Z7901 Long term (current) use of anticoagulants: Secondary | ICD-10-CM

## 2011-05-09 DIAGNOSIS — I2510 Atherosclerotic heart disease of native coronary artery without angina pectoris: Secondary | ICD-10-CM | POA: Diagnosis present

## 2011-05-09 DIAGNOSIS — D62 Acute posthemorrhagic anemia: Secondary | ICD-10-CM | POA: Diagnosis present

## 2011-05-09 DIAGNOSIS — E119 Type 2 diabetes mellitus without complications: Secondary | ICD-10-CM | POA: Diagnosis present

## 2011-05-09 DIAGNOSIS — Z79899 Other long term (current) drug therapy: Secondary | ICD-10-CM

## 2011-05-09 LAB — CBC
MCV: 81.8 fL (ref 78.0–100.0)
Platelets: 129 10*3/uL — ABNORMAL LOW (ref 150–400)
RDW: 18.5 % — ABNORMAL HIGH (ref 11.5–15.5)
WBC: 6.3 10*3/uL (ref 4.0–10.5)

## 2011-05-09 LAB — BASIC METABOLIC PANEL
Calcium: 9.4 mg/dL (ref 8.4–10.5)
Chloride: 103 mEq/L (ref 96–112)
Creatinine, Ser: 1 mg/dL (ref 0.50–1.35)
GFR calc Af Amer: >90 mL/min (ref >90)

## 2011-05-09 LAB — PROTIME-INR
INR: 1.94 — ABNORMAL HIGH (ref 0.00–1.49)
Prothrombin Time: 22.5 seconds — ABNORMAL HIGH (ref 11.6–15.2)

## 2011-05-10 LAB — CBC
HCT: 27.3 % — ABNORMAL LOW (ref 39.0–52.0)
Hemoglobin: 8.3 g/dL — ABNORMAL LOW (ref 13.0–17.0)
MCH: 24.8 pg — ABNORMAL LOW (ref 26.0–34.0)
MCHC: 30.4 g/dL (ref 30.0–36.0)
MCV: 81.5 fL (ref 78.0–100.0)
Platelets: 117 10*3/uL — ABNORMAL LOW (ref 150–400)
RBC: 3.35 MIL/uL — ABNORMAL LOW (ref 4.22–5.81)
RDW: 17.6 % — ABNORMAL HIGH (ref 11.5–15.5)
WBC: 6.5 10*3/uL (ref 4.0–10.5)

## 2011-05-10 LAB — GLUCOSE, CAPILLARY
Glucose-Capillary: 152 mg/dL — ABNORMAL HIGH (ref 70–99)
Glucose-Capillary: 131 mg/dL — ABNORMAL HIGH (ref 70–99)
Glucose-Capillary: 161 mg/dL — ABNORMAL HIGH (ref 70–99)

## 2011-05-10 LAB — PROTIME-INR: Prothrombin Time: 20.4 seconds — ABNORMAL HIGH (ref 11.6–15.2)

## 2011-05-10 NOTE — H&P (Signed)
[name], [name] NO.:  [id number]  MEDICAL RECORD NO.:  [id number]  LOCATION:  3738                         FACILITY:  [facility]  PHYSICIAN:  Matthew Canal, MD      DATE OF BIRTH:  20-Oct-1945  DATE OF ADMISSION:  05/09/2011 DATE OF DISCHARGE:                             HISTORY & PHYSICAL   PRIMARY CARE PHYSICIAN:  [name], M.D.  GASTROENTEROLOGISTS:  Matthew Hawks. Matthew Howard, MD.  CARDIOLOGIST:  Matthew Codding, MD, [facility]  CHIEF COMPLAINT:  Black colored stool for the last 3 days.  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  He was just discharged from the hospital on October 9 after presenting with GI bleeding.  At the time of discharge on May 02, 2011, his hemoglobin was 8.8, hematocrit 28.7. Apparently, his hemoglobin was 7 today hence decision for admission for repeat endoscopy.  The patient has been maintained on Coumadin and aspirin because of the high risk cardiac history.  He denies any other complaints.  No shortness of breath or dizziness.  PAST MEDICAL HISTORY: 1. Diabetes mellitus type 2. 2. 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 3. Hyperlipidemia. 4. History of CAD status post triple bypass in 2004 status post     stenting. 5. Arterial fibrillation on Coumadin. 6. Malignant hypertension.  ALLERGIES:  CODEINE, CARDIZEM, and NIACIN.  FAMILY HISTORY:  Positive for mother with history of GI bleeding and systemic lupus erythematosus.  HOME MEDICATIONS:  Aspirin, benzonatate, digoxin, ferrous sulfate, fish oil, glimepiride, Januvia, Klor-Con, Lasix, Lipitor, lisinopril, metformin, Lopressor,  multivitamins, Prilosec, Tylenol Extra Strength, verapamil, Coumadin.  SOCIAL HISTORY:  The patient drinks alcohol occasionally.  He is married, lives with his wife.  Quit cigarette smoking 11 years ago.  REVIEW OF SYSTEMS:  Unremarkable except as highlighted in the history of present illness.  PHYSICAL EXAMINATION:  GENERAL:  This is a well-looking gentleman who is not in acute distress. VITAL SIGNS:  Blood pressure 117/70, heart rate 97, temperature 97.9, respirations 19, oxygen saturation 95% on room air. HEAD, EARS, NOSE, AND THROAT:  No jugular venous distention.  No carotid bruits. RESPIRATORY:  Good air entry bilaterally.  No rhonchi, rales, or wheezes. CARDIOVASCULAR:  First and second heart sounds heard.  No murmurs, rubs, regular. ABDOMEN:  Obese, otherwise, soft, nontender.  No palpable organomegaly. Bowel sounds are normal. CNS:  The patient is alert, oriented in person, place, and time.  No acute focal neurological deficits. EXTREMITIES:  No pedal edema.  Peripheral pulses are equal.  LABORATORY DATA:  The patient has no labs at present.  No imaging studies.  IMPRESSION:  Recurrent gastrointestinal bleeding, probably related to AV malformation in a patient on Coumadin and aspirin due to extensive cardiac risk factors.  PLAN: 1. Acute blood loss anemia.  We will admit the patient for PRBC     transfusion as well as EGD per Dr. Elnoria [name].  Continue PPI and  continue     current anticoagulation per Dr. Elnoria [name].  If he continues to have     these episodes, it would be worth having Cardiology revisit the     anticoagulation.  We will transfuse 2 units PRBC, also give     crystalloids and PPI. 2. Atrial fibrillation, coronary artery disease seems stable.  Resume     home medications.  Obtain EKG. 3. Hypertension seems controlled.  Resume home medications. 4. Diabetes mellitus type 2.  Resume home medications.  Hold     glimepiride until after EGD, sliding scale insulin. 5.  The patient's condition is guarded.     Matthew Canal, MD     SR/MEDQ  D:  05/09/2011  T:  05/09/2011  Job:  [id number]  cc:   Matthew Codding, MD,FACC Matthew Hawks. Matthew Howard, MD [name], M.D.  Electronically Signed by [name]  on 05/10/2011 04:54:09 PM

## 2011-05-11 LAB — CBC
HCT: 27.8 % — ABNORMAL LOW (ref 39.0–52.0)
MCHC: 30.2 g/dL (ref 30.0–36.0)
Platelets: 121 10*3/uL — ABNORMAL LOW (ref 150–400)
RDW: 18 % — ABNORMAL HIGH (ref 11.5–15.5)

## 2011-05-11 LAB — CROSSMATCH
Antibody Screen: POSITIVE
Unit division: 0

## 2011-05-11 LAB — PROTIME-INR
INR: 1.52 — ABNORMAL HIGH (ref 0.00–1.49)
Prothrombin Time: 18.6 seconds — ABNORMAL HIGH (ref 11.6–15.2)

## 2011-05-11 LAB — GLUCOSE, CAPILLARY
Glucose-Capillary: 111 mg/dL — ABNORMAL HIGH (ref 70–99)
Glucose-Capillary: 187 mg/dL — ABNORMAL HIGH (ref 70–99)

## 2011-05-12 ENCOUNTER — Other Ambulatory Visit: Payer: Self-pay | Admitting: Gastroenterology

## 2011-05-12 LAB — BASIC METABOLIC PANEL
CO2: 27 mEq/L (ref 19–32)
Chloride: 103 mEq/L (ref 96–112)
Creatinine, Ser: 0.91 mg/dL (ref 0.50–1.35)

## 2011-05-12 LAB — GLUCOSE, CAPILLARY
Glucose-Capillary: 116 mg/dL — ABNORMAL HIGH (ref 70–99)
Glucose-Capillary: 118 mg/dL — ABNORMAL HIGH (ref 70–99)

## 2011-05-12 LAB — CBC
MCV: 82.6 fL (ref 78.0–100.0)
Platelets: 119 10*3/uL — ABNORMAL LOW (ref 150–400)
RBC: 3.5 MIL/uL — ABNORMAL LOW (ref 4.22–5.81)
WBC: 5.6 10*3/uL (ref 4.0–10.5)

## 2011-05-13 ENCOUNTER — Encounter (HOSPITAL_COMMUNITY): Payer: Self-pay | Admitting: Cardiology

## 2011-05-13 LAB — CBC
HCT: 30.4 % — ABNORMAL LOW (ref 39.0–52.0)
Hemoglobin: 8.9 g/dL — ABNORMAL LOW (ref 13.0–17.0)
MCH: 24.2 pg — ABNORMAL LOW (ref 26.0–34.0)
MCV: 82.6 fL (ref 78.0–100.0)
RBC: 3.68 MIL/uL — ABNORMAL LOW (ref 4.22–5.81)

## 2011-05-13 LAB — PROTIME-INR: Prothrombin Time: 21.4 seconds — ABNORMAL HIGH (ref 11.6–15.2)

## 2011-05-16 ENCOUNTER — Telehealth: Payer: Self-pay | Admitting: *Deleted

## 2011-05-16 NOTE — Telephone Encounter (Signed)
Wife called with results.

## 2011-05-16 NOTE — Telephone Encounter (Signed)
Message copied by Gaynelle Adu on Mon May 16, 2011  8:53 AM ------      Message from: Kathlen Brunswick      Created: Sun May 15, 2011  2:01 PM       Diagnostic testing reviewed; mildly abnormal results.      No change in medical therapy.

## 2011-05-19 ENCOUNTER — Encounter (HOSPITAL_COMMUNITY): Payer: Managed Care, Other (non HMO)

## 2011-05-20 ENCOUNTER — Encounter (HOSPITAL_COMMUNITY): Payer: Managed Care, Other (non HMO)

## 2011-05-20 NOTE — Discharge Summary (Signed)
[name], BURGARD NO.:  [id number]  MEDICAL RECORD NO.:  [id number]  LOCATION:  3738                         FACILITY:  [facility]  PHYSICIAN:  Marcellus Scott, MD     DATE OF BIRTH:  September 20, 1945  DATE OF ADMISSION:  05/09/2011 DATE OF DISCHARGE:  05/13/2011                        DISCHARGE SUMMARY - REFERRING   PRIMARY CARE PHYSICIAN:  [name], MD, of [facility].  GASTROENTEROLOGIST:  [name]. [name], MD  CARDIOLOGIST:  [name], MD, [facility]  DISCHARGE DIAGNOSES: 1. Recurrent gastrointestinal bleeding, possibly secondary to duodenal     arteriovenous malformations, currently resolved. 2. Acute blood loss anemia, status post transfusion. 3. Atrial fibrillation, on chronic Coumadin. 4. Type 2 diabetes mellitus. 5. Hypertension. 6. Thrombocytopenia, possibly chronic. 7. Hyperlipidemia. 8. History of coronary artery disease, status post bypass in 2004 and     status post stent.  DISCHARGE MEDICATIONS: 1. Verapamil reduced to 80 mg p.o. b.i.d. 2. Warfarin 5 mg p.o. daily except on Tuesdays and Thursdays when he     is to take 7.5 mg. 3. Enteric-coated aspirin 81 mg p.o. daily. 4. Benzonatate 100 mg p.o. t.i.d. 5. Digoxin 0.25 mg p.o. daily. 6. Fish oil 1200 mg p.o. b.i.d. 7. Amaryl 4 mg p.o. nightly. 8. Januvia 100 mg p.o. daily. 9. Klor-Con 20 mEq p.o. b.i.d. 10.Lasix 80 mg p.o. daily. 11.Lipitor 20 mg p.o. nightly. 12.Lisinopril 10 mg p.o. daily. 13.Metformin 1000 mg p.o. b.i.d. 14.Metoprolol tartrate 50 mg p.o. b.i.d. 15.Multivitamins 1 tablet p.o. daily. 16.Omeprazole 40 mg p.o. b.i.d. 17.Tylenol Extra Strength 1000 mg p.o. b.i.d.  PROCEDURES:  EGD on May 10, 2011, by Dr. Jeani Hawking.  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  IMAGING:  None  LABORATORY DATA:  CBC today:  Hemoglobin 8.9, hematocrit 30.4, white blood cell 5.9, platelets 123.  INR is 1.82.  Basic metabolic  panel unremarkable except for glucose of 108.  CONSULTATIONS:  Gastroenterology, Dr. Jeani Hawking.  DIET:  Diabetic diet.  ACTIVITY:  Ad lib.  TODAY'S COMPLAINTS:  None.  The patient denies any dizziness or lightheadedness.  He indicates that his stool color is back to its usual baseline which is still dark from his iron supplements.  No shortness of breath or chest pain.  PHYSICAL EXAMINATION:  GENERAL:  The patient is in no obvious distress. TELEMETRY:  Atrial fibrillation in the 60s to 80s. VITAL SIGNS:  Temperature 98 degrees Fahrenheit, pulse 71 per minute and irregular, respirations 18 per minute, blood pressure 125/75 mmHg, and saturating at 98% on room air.  CBGs range from 103-137 mg/dL. RESPIRATORY:  Clear. CARDIOVASCULAR:  First and second sounds heard, irregular. ABDOMEN:  Nondistended, soft, and normal bowel sounds heard. CENTRAL NERVOUS SYSTEM:  The patient is awake, alert, oriented x3 with no focal neurological deficits.  HOSPITAL COURSE:  [name] is a 65 year old male patient with history of type 2 diabetes mellitus, hypertension, hyperlipidemia, atrial fibrillation on chronic Coumadin, coronary artery disease status post CABG and stenting, who was recently discharged after an episode of upper GI bleed at which time he had clipping of AV malformations.  He now returned with dark colored stools suggestive of melena.  His hemoglobin was 7 g.  1. Recurrent upper gastrointestinal bleed, possibly secondary to duodenal and question of gastric AV malformations.  The patient was admitted to the hospital.  Gastroenterology was consulted.  Dr. [name] repeated EGD. There was no active bleeding noted.  There was the hemoclip in the second portion of the duodenum from before.  Just distal to that area, there was another AVM.  There were other suspicious sites and it is not known if these sited were secondary to scope trauma versus true AVMs. APC was applied to this area.   A total of 5 hemoclips were placed onto the APC site and then the procedure was terminated.  He was closely monitored and his aspirin and Coumadin were continued.  He did not have any further GI bleeding.  Dr. [name] saw him today and cleared him for discharge.  Dr. [name] has discussed his case with his primary cardiologist, Dr. Andee Lineman.  At this time, it is agreed that the patient can be discharged home, but if he were to rebleed then his Coumadin will have to be stopped for at least 1-3 months.  The patient will follow up with Dr. [name] on Monday, May 16, 2011.  2. Acute blood loss anemia.  The patient is status post transfusion of 2 units of packed red blood cells.  His hemoglobin and hematocrits are stable.  3. Atrial fibrillation, on chronic Coumadin.  The patient had a few slow ventricular responses and the longest one being 2.57 seconds.  These were asymptomatic.  His verapamil dose had been reduced in the hospital. The patient will continue on his other medications.  His Coumadin will also be continued.  4. Thrombocytopenia which is possibly chronic and can be followed as an outpatient.  5. Type 2 diabetes mellitus.  Continue home medications.  6. History of coronary artery disease, status post CABG and stents.  This has remained stable with no symptoms.  DISPOSITION:  The patient will be discharged home in stable condition.  FOLLOWUP RECOMMENDATIONS: 1. With repeat CBCs and PT/INR at Dr. Haywood Pao office on May 16, 2011, and follow up with Dr. [name]. 2. With Dr. Lewayne Bunting.  The patient is to call for an appointment. 3. With Dr. Silvestre Moment.  The patient is to call for an appointment.  Time taken in coordinating this discharge was 35 minutes.     Marcellus Scott, MD     AH/MEDQ  D:  05/13/2011  T:  05/13/2011  Job:  [id number]  cc:   [name], M.D. [name], MD,FACC  Electronically Signed by Marcellus Scott MD on 05/20/2011 08:40:37 PM

## 2011-05-26 ENCOUNTER — Telehealth: Payer: Self-pay | Admitting: Cardiology

## 2011-05-27 ENCOUNTER — Inpatient Hospital Stay (HOSPITAL_COMMUNITY)
Admission: AD | Admit: 2011-05-27 | Discharge: 2011-05-30 | DRG: 378 | Disposition: A | Discharge status: Home / Self Care | Payer: Managed Care, Other (non HMO) | Source: Ambulatory Visit | Attending: Internal Medicine | Admitting: Internal Medicine

## 2011-05-27 DIAGNOSIS — Z7982 Long term (current) use of aspirin: Secondary | ICD-10-CM

## 2011-05-27 DIAGNOSIS — Z7901 Long term (current) use of anticoagulants: Secondary | ICD-10-CM

## 2011-05-27 DIAGNOSIS — I10 Essential (primary) hypertension: Secondary | ICD-10-CM | POA: Diagnosis present

## 2011-05-27 DIAGNOSIS — K922 Gastrointestinal hemorrhage, unspecified: Secondary | ICD-10-CM | POA: Diagnosis present

## 2011-05-27 DIAGNOSIS — I25810 Atherosclerosis of coronary artery bypass graft(s) without angina pectoris: Secondary | ICD-10-CM

## 2011-05-27 DIAGNOSIS — E781 Pure hyperglyceridemia: Secondary | ICD-10-CM | POA: Diagnosis present

## 2011-05-27 DIAGNOSIS — Z951 Presence of aortocoronary bypass graft: Secondary | ICD-10-CM

## 2011-05-27 DIAGNOSIS — R0602 Shortness of breath: Secondary | ICD-10-CM

## 2011-05-27 DIAGNOSIS — E663 Overweight: Secondary | ICD-10-CM

## 2011-05-27 DIAGNOSIS — D696 Thrombocytopenia, unspecified: Secondary | ICD-10-CM | POA: Diagnosis present

## 2011-05-27 DIAGNOSIS — R0989 Other specified symptoms and signs involving the circulatory and respiratory systems: Secondary | ICD-10-CM | POA: Diagnosis present

## 2011-05-27 DIAGNOSIS — R0609 Other forms of dyspnea: Secondary | ICD-10-CM | POA: Diagnosis present

## 2011-05-27 DIAGNOSIS — D62 Acute posthemorrhagic anemia: Secondary | ICD-10-CM | POA: Diagnosis present

## 2011-05-27 DIAGNOSIS — E669 Obesity, unspecified: Secondary | ICD-10-CM | POA: Diagnosis present

## 2011-05-27 DIAGNOSIS — E119 Type 2 diabetes mellitus without complications: Secondary | ICD-10-CM | POA: Diagnosis present

## 2011-05-27 DIAGNOSIS — E785 Hyperlipidemia, unspecified: Secondary | ICD-10-CM

## 2011-05-27 DIAGNOSIS — M7989 Other specified soft tissue disorders: Secondary | ICD-10-CM | POA: Diagnosis present

## 2011-05-27 DIAGNOSIS — I4891 Unspecified atrial fibrillation: Secondary | ICD-10-CM

## 2011-05-27 DIAGNOSIS — K31811 Angiodysplasia of stomach and duodenum with bleeding: Principal | ICD-10-CM | POA: Diagnosis present

## 2011-05-27 DIAGNOSIS — D649 Anemia, unspecified: Secondary | ICD-10-CM

## 2011-05-27 DIAGNOSIS — I2510 Atherosclerotic heart disease of native coronary artery without angina pectoris: Secondary | ICD-10-CM | POA: Diagnosis present

## 2011-05-27 LAB — DIFFERENTIAL
Basophils Relative: 1 % (ref 0–1)
Eosinophils Relative: 3 % (ref 0–5)
Monocytes Relative: 9 % (ref 3–12)
Neutrophils Relative %: 67 % (ref 43–77)

## 2011-05-27 LAB — CBC
HCT: 27.5 % — ABNORMAL LOW (ref 39.0–52.0)
Hemoglobin: 7.7 g/dL — ABNORMAL LOW (ref 13.0–17.0)
MCV: 81.4 fL (ref 78.0–100.0)
RBC: 3.38 MIL/uL — ABNORMAL LOW (ref 4.22–5.81)
WBC: 5 10*3/uL (ref 4.0–10.5)

## 2011-05-27 LAB — D-DIMER, QUANTITATIVE: D-Dimer, Quant: 0.57 ug/mL-FEU — ABNORMAL HIGH (ref 0.00–0.48)

## 2011-05-27 LAB — APTT: aPTT: 41 seconds — ABNORMAL HIGH (ref 24–37)

## 2011-05-27 LAB — COMPREHENSIVE METABOLIC PANEL
AST: 25 U/L (ref 0–37)
Albumin: 3.7 g/dL (ref 3.5–5.2)
Alkaline Phosphatase: 64 U/L (ref 39–117)
BUN: 16 mg/dL (ref 6–23)
Chloride: 102 mEq/L (ref 96–112)
Potassium: 4 mEq/L (ref 3.5–5.1)
Sodium: 142 mEq/L (ref 135–145)
Total Protein: 7.5 g/dL (ref 6.0–8.3)

## 2011-05-27 LAB — PROTIME-INR: INR: 2.43 — ABNORMAL HIGH (ref 0.00–1.49)

## 2011-05-27 LAB — CARDIAC PANEL(CRET KIN+CKTOT+MB+TROPI)
Relative Index: INVALID (ref 0.0–2.5)
Total CK: 54 U/L (ref 7–232)
Troponin I: <0.3 ng/mL (ref <0.30)

## 2011-05-27 LAB — GLUCOSE, CAPILLARY

## 2011-05-27 LAB — MAGNESIUM: Magnesium: 2 mg/dL (ref 1.5–2.5)

## 2011-05-27 NOTE — Telephone Encounter (Signed)
Voice mailbox unable to receive messages.

## 2011-05-27 NOTE — H&P (Signed)
[name], GRISSINGER NO.:  [id number]  MEDICAL RECORD NO.:  [id number]  LOCATION:  4730                         FACILITY:  [facility]  PHYSICIAN:  Talmage Nap, MD  DATE OF BIRTH:  December 28, 1945  DATE OF ADMISSION:  05/27/2011 DATE OF DISCHARGE:                             HISTORY & PHYSICAL   PRIMARY CARE PHYSICIAN:  [name], M.D. of [facility].  PRIMARY GASTROENTEROLOGIST:  [name]. [name], MD  PRIMARY CARDIOLOGIST:  [name], MD, [facility]  History obtainable from the patient and patient's spouse   CHIEF COMPLAINT:  Shortness of breath for about 2 weeks duration as well as bright red blood per rectum.  HISTORY OF PRESENT ILLNESS:  The patient is a 65 year old Caucasian male with history of recurrent GI bleed most likely secondary to gastric/duodenal AVMs and also has a history of atrial fibrillation on chronic anticoagulation with Coumadin, coronary artery disease status post CABG who was initially admitted to the hospital on May 13, 2011, and discharged on May 09, 2011 with impression of recurrent GI bleed.  In that admission, the patient had EGD done, which confirmed AVM.  He was transfused and subsequently discharged on May 13, 2011, and placed back on his Coumadin.  He however saw his primary care physician today because he was short of breath and shortness of breath was getting progressively worse.  He also complained about progressive swelling of the lower extremity.  In addition, the patient complained of bright red blood per rectum.  He denied any history of chest pain.  He denied any fever.  He denied any chills.  He denied any rigor.  He denied any history of nausea or vomiting.  In his PCPs office, baseline blood work done showed hemoglobin to be 7.0 and subsequently the patient was asked to come to the hospital for further evaluation and blood transfusion.  PAST MEDICAL HISTORY: 1. Positive for  recurrent GI bleed most likely secondary to rule out     gastric/duodenal arteriovenous malformations. 2. Acute blood loss secondary to a GI bleed status post packed RBC     transfusion. 3. Atrial fibrillation, on chronic anticoagulation with Coumadin. 4. Type 2 diabetes mellitus. 5. Hypertension. 6. History of thrombocytopenia, which is said to be chronic. 7. Hyperlipidemia. 8. Coronary artery disease.  PAST SURGICAL HISTORY: 1. Coronary artery disease status post CABG status post stent. 2. EGD, which showed gastric/duodenal AVM.  MEDICATIONS:  For his preadmission meds, referring to the discharge meds on May 13, 2011, include, 1. Verapamil 80 mg p.o. b.i.d. 2. Warfarin 5 mg p.o. daily on Tuesdays, Thursdays, and other days 7.5     mg. 3. Enteric-coated aspirin 81 mg p.o. daily. 4. Benzonatate 100 mg p.o. t.i.d. 5. Digoxin 0.25 mg p.o. daily. 6. Fish oil 1200 mg p.o. b.i.d. 7. Amaryl 4 mg p.o. at bedtime. 8. Januvia 100 mg p.o. daily. 9. Potassium chloride 20 mEq p.o. b.i.d. 10.Lasix 80 mg p.o. daily. 11.Lipitor 20 mg p.o. at bedtime. 12.Lisinopril 10 mg p.o. daily. 13.Metformin 1000 mg p.o. b.i.d. 14.Metoprolol tartrate 50 mg p.o. b.i.d. 15.Multivitamin 1 p.o. daily. 16.Omeprazole 40 mg p.o. b.i.d. 17.Tylenol Extra Strength 1000 mg p.o.  b.i.d.  ALLERGIES: 1. CODEINE. 2. DILTIAZEM. 3. NIACIN.  SOCIAL HISTORY:  Negative for tobacco use.  Occasionally, takes alcohol and lives at home with his spouse.  FAMILY HISTORY:  York Spaniel to be positive for SLE.  REVIEW OF SYSTEMS:  The patient denies any history of headaches.  He denies any blurred vision.  No nausea or vomiting.  No fever.  No chills.  No rigor.  Complained about shortness of breath.  Denies any chest pain.  No cough.  No abdominal discomfort.  No diarrhea or hematochezia.  No dysuria or hematuria.  Has progressive swelling of the lower extremities.  No intolerance to heat or cold and no neuropsychiatric  disorder.  PHYSICAL EXAMINATION:  GENERAL:  Very pleasant man, pale and not in any acute respiratory distress. VITAL SIGNS:  At present blood pressure is 118/52, temperature is 98.6, pulse 81, and respiratory rate 18. HEENT:  Pallor, but pupils are reactive to light and extraocular muscles are intact. NECK:  He has no jugular venous distention.  No carotid bruit.  No lymphadenopathy. CHEST:  Crackles in lower lobes of the lungs bilaterally. HEART:  Sounds are 1 and 2 with soft systolic murmur. ABDOMEN:  Soft, nontender.  Liver, spleen, and kidney not palpable. Bowel sounds are positive. EXTREMITIES:  Showed +1 pedal edema. NEUROLOGIC:  Nonfocal. MUSCULOSKELETAL:  Arthritic changes in the knees. NEUROPSYCHIATRIC:  Unremarkable.  LABORATORY DATA:  At the moment, no labs available.  IMPRESSION: 1. Gastrointestinal bleed. 2. Anemia. 3. Shortness of breath and etiology could be multifactorial, secondary     to the gastrointestinal bleed and possibly congestive heart failure     (not otherwise specified). 4. History of recurrent gastrointestinal bleed, most likely secondary     to gastric show duodenal arteriovenous malformations. 5. Atrial fibrillation. 6. Coumadin will be on hold for now. 7. Diabetes mellitus. 8. Coronary artery disease status post CABG plus stent. 9. Hypertension. 10.Hyperlipidemia.  PLAN:  Plan is to admit the patient to telemetry.  The patient will be saline locked.  He will have CBC stat and if H and H is less than 8.0 g/dL, he will be typed and crossed and transfused with 2 units of packed RBCs and he will be given Lasix 40 mg IV after first unit and subsequently Lasix 80 mg IV q.24.  Other medication to be given to the patient will include lisinopril 5 mg p.o. daily, Lopressor 25 mg p.o. b.i.d., and Digoxin 0.125 mg p.o. daily.  The patient will also be on Protonix 40 mg IV q.12 h.  Since the patient is diabetic, he will be on metformin 500 mg p.o.  b.i.d. followed by Accu-Cheks t.i.d. before meals and at bedtime, regular insulin sliding scale (moderate scale) and he will also be on Lipitor 20 mg p.o. daily.  For now, Coumadin will be on hold and TED stockings or SCD boots will be used for DVT prophylaxis. Labs work to be done on this patient will include cardiac enzymes q.6 h. x3, D-dimer stat, proBNP stat, CBCD stat will be repeated in a.m.  CMP and magnesium stat again will be repeated in a.m.  The patient will have PT, PTT, INR stat.  The last 2D echo that was done on the patient was dated way back in 2006 and therefore the patient will have a 2D echo done on this admission.  Dr. [name], GI physician will be consulted in a.m. for further evaluation of this patient.  The patient will be followed and evaluated on day-to-day  basis.     Talmage Nap, MD     CN/MEDQ  D:  05/27/2011  T:  05/27/2011  Job:  [id number]  Electronically Signed by Talmage Nap  on 05/27/2011 08:32:07 PM

## 2011-05-28 LAB — MAGNESIUM: Magnesium: 1.8 mg/dL (ref 1.5–2.5)

## 2011-05-28 LAB — COMPREHENSIVE METABOLIC PANEL
Albumin: 3.6 g/dL (ref 3.5–5.2)
Alkaline Phosphatase: 59 U/L (ref 39–117)
BUN: 15 mg/dL (ref 6–23)
Chloride: 100 mEq/L (ref 96–112)
GFR calc Af Amer: >90 mL/min (ref >90)
Glucose, Bld: 159 mg/dL — ABNORMAL HIGH (ref 70–99)
Potassium: 3.6 mEq/L (ref 3.5–5.1)
Total Bilirubin: 1.2 mg/dL (ref 0.3–1.2)

## 2011-05-28 LAB — GLUCOSE, CAPILLARY
Glucose-Capillary: 144 mg/dL — ABNORMAL HIGH (ref 70–99)
Glucose-Capillary: 126 mg/dL — ABNORMAL HIGH (ref 70–99)

## 2011-05-28 LAB — CBC
HCT: 30.8 % — ABNORMAL LOW (ref 39.0–52.0)
MCH: 23.1 pg — ABNORMAL LOW (ref 26.0–34.0)
MCHC: 28.6 g/dL — ABNORMAL LOW (ref 30.0–36.0)
MCV: 80.8 fL (ref 78.0–100.0)
RDW: 17 % — ABNORMAL HIGH (ref 11.5–15.5)

## 2011-05-28 LAB — DIFFERENTIAL
Eosinophils Relative: 2 % (ref 0–5)
Lymphocytes Relative: 15 % (ref 12–46)
Lymphs Abs: 0.8 10*3/uL (ref 0.7–4.0)
Monocytes Absolute: 0.5 10*3/uL (ref 0.1–1.0)

## 2011-05-28 LAB — CARDIAC PANEL(CRET KIN+CKTOT+MB+TROPI): Troponin I: <0.3 ng/mL (ref <0.30)

## 2011-05-28 MED ORDER — INSULIN ASPART 100 UNIT/ML ~~LOC~~ SOLN: 0.0000 [IU] | Freq: Every day | SUBCUTANEOUS | Status: DC

## 2011-05-28 MED ORDER — DIGOXIN 250 MCG PO TABS
0.2500 mg | ORAL_TABLET | Freq: Every day | ORAL | Status: DC
  Administered 2011-05-29 – 2011-05-30 (×2): 0.25 mg via ORAL
  Filled 2011-05-28 (×2): qty 1

## 2011-05-28 MED ORDER — BENZONATATE 100 MG PO CAPS
100.0000 mg | ORAL_CAPSULE | Freq: Three times a day (TID) | ORAL | Status: DC
  Administered 2011-05-29 – 2011-05-30 (×4): 100 mg via ORAL
  Filled 2011-05-28 (×8): qty 1

## 2011-05-28 MED ORDER — THERA M PLUS PO TABS
1.0000 | ORAL_TABLET | Freq: Every day | ORAL | Status: DC
  Administered 2011-05-29 – 2011-05-30 (×2): 1 via ORAL
  Filled 2011-05-28 (×2): qty 1

## 2011-05-28 MED ORDER — INSULIN ASPART 100 UNIT/ML ~~LOC~~ SOLN: 0.0000 [IU] | Freq: Three times a day (TID) | SUBCUTANEOUS | Status: DC

## 2011-05-28 MED ORDER — LISINOPRIL 5 MG PO TABS
5.0000 mg | ORAL_TABLET | Freq: Every day | ORAL | Status: DC
  Filled 2011-05-28 (×2): qty 1

## 2011-05-28 MED ORDER — SODIUM CHLORIDE 0.9 % IJ SOLN
3.0000 mL | Freq: Two times a day (BID) | INTRAMUSCULAR | Status: DC
  Administered 2011-05-29 – 2011-05-30 (×3): 3 mL via INTRAVENOUS

## 2011-05-28 MED ORDER — DIGOXIN 125 MCG PO TABS
0.1250 mg | ORAL_TABLET | Freq: Every day | ORAL | Status: DC
  Filled 2011-05-28 (×2): qty 1

## 2011-05-28 MED ORDER — FUROSEMIDE 10 MG/ML IJ SOLN
40.0000 mg | Freq: Every day | INTRAMUSCULAR | Status: DC
  Administered 2011-05-29 – 2011-05-30 (×2): 40 mg via INTRAVENOUS
  Filled 2011-05-28: qty 4

## 2011-05-28 MED ORDER — OMEGA-3-ACID ETHYL ESTERS 1 G PO CAPS
1.0000 g | ORAL_CAPSULE | Freq: Two times a day (BID) | ORAL | Status: DC
  Administered 2011-05-29 – 2011-05-30 (×3): 1 g via ORAL
  Filled 2011-05-28 (×5): qty 1

## 2011-05-28 MED ORDER — ACETAMINOPHEN 500 MG PO TABS
1000.0000 mg | ORAL_TABLET | Freq: Two times a day (BID) | ORAL | Status: DC
  Administered 2011-05-29: 500 mg via ORAL
  Administered 2011-05-29 – 2011-05-30 (×2): 1000 mg via ORAL
  Filled 2011-05-28 (×5): qty 2

## 2011-05-28 MED ORDER — METFORMIN HCL 500 MG PO TABS
500.0000 mg | ORAL_TABLET | Freq: Two times a day (BID) | ORAL | Status: DC
  Administered 2011-05-29 – 2011-05-30 (×3): 500 mg via ORAL
  Filled 2011-05-28 (×7): qty 1

## 2011-05-28 MED ORDER — VERAPAMIL HCL 80 MG PO TABS
80.0000 mg | ORAL_TABLET | Freq: Two times a day (BID) | ORAL | Status: DC
  Administered 2011-05-29 – 2011-05-30 (×3): 80 mg via ORAL
  Filled 2011-05-28 (×5): qty 1

## 2011-05-28 MED ORDER — ROSUVASTATIN CALCIUM 10 MG PO TABS
10.0000 mg | ORAL_TABLET | Freq: Every day | ORAL | Status: DC
  Administered 2011-05-29: 10 mg via ORAL
  Filled 2011-05-28 (×3): qty 1

## 2011-05-28 MED ORDER — ASPIRIN EC 81 MG PO TBEC
81.0000 mg | DELAYED_RELEASE_TABLET | Freq: Every day | ORAL | Status: DC
  Administered 2011-05-29 – 2011-05-30 (×2): 81 mg via ORAL
  Filled 2011-05-28 (×3): qty 1

## 2011-05-28 MED ORDER — PANTOPRAZOLE SODIUM 40 MG IV SOLR
40.0000 mg | Freq: Two times a day (BID) | INTRAVENOUS | Status: DC
  Administered 2011-05-29 – 2011-05-30 (×3): 40 mg via INTRAVENOUS
  Filled 2011-05-28 (×6): qty 40

## 2011-05-28 MED ORDER — METOPROLOL TARTRATE 25 MG PO TABS
25.0000 mg | ORAL_TABLET | Freq: Two times a day (BID) | ORAL | Status: DC
  Administered 2011-05-29 – 2011-05-30 (×3): 25 mg via ORAL
  Filled 2011-05-28 (×6): qty 1

## 2011-05-28 MED ORDER — GLIMEPIRIDE 4 MG PO TABS
4.0000 mg | ORAL_TABLET | Freq: Every day | ORAL | Status: DC
  Administered 2011-05-29: 4 mg via ORAL
  Filled 2011-05-28 (×3): qty 1

## 2011-05-28 MED ORDER — SITAGLIPTIN PHOSPHATE 100 MG PO TABS
100.0000 mg | ORAL_TABLET | Freq: Every day | ORAL | Status: DC
  Administered 2011-05-29 – 2011-05-30 (×2): 100 mg via ORAL
  Filled 2011-05-28 (×2): qty 1

## 2011-05-28 MED ORDER — FERROUS SULFATE 220 (44 FE) MG/5ML PO ELIX
487.5000 mg | ORAL_SOLUTION | Freq: Two times a day (BID) | ORAL | Status: DC
  Administered 2011-05-29 – 2011-05-30 (×3): 487.5 mg via ORAL
  Filled 2011-05-28 (×5): qty 11.1

## 2011-05-28 MED ORDER — POTASSIUM CHLORIDE CRYS ER 20 MEQ PO TBCR
20.0000 meq | EXTENDED_RELEASE_TABLET | Freq: Two times a day (BID) | ORAL | Status: DC
  Administered 2011-05-29 – 2011-05-30 (×3): 20 meq via ORAL
  Filled 2011-05-28 (×2): qty 1

## 2011-05-29 ENCOUNTER — Other Ambulatory Visit: Payer: Self-pay

## 2011-05-29 LAB — CROSSMATCH
ABO/RH(D): A POS
Antibody Screen: POSITIVE
Unit division: 0
Unit division: 0

## 2011-05-29 LAB — GLUCOSE, CAPILLARY
Glucose-Capillary: 100 mg/dL — ABNORMAL HIGH (ref 70–99)
Glucose-Capillary: 145 mg/dL — ABNORMAL HIGH (ref 70–99)
Glucose-Capillary: 159 mg/dL — ABNORMAL HIGH (ref 70–99)

## 2011-05-29 LAB — CBC
Hemoglobin: 9.1 g/dL — ABNORMAL LOW (ref 13.0–17.0)
MCHC: 29.3 g/dL — ABNORMAL LOW (ref 30.0–36.0)
WBC: 4.8 10*3/uL (ref 4.0–10.5)

## 2011-05-29 LAB — PROTIME-INR
INR: 1.89 — ABNORMAL HIGH (ref 0.00–1.49)
Prothrombin Time: 22 seconds — ABNORMAL HIGH (ref 11.6–15.2)

## 2011-05-29 NOTE — Progress Notes (Signed)
Subjective: Only one BM. No nausea no vomiting.  Objective: Weight change:   Intake/Output Summary (Last 24 hours) at 05/29/11 1619 Last data filed at 05/29/11 1346  Gross per 24 hour  Intake    720 ml  Output   1002 ml  Net   -282 ml   BP 97/59  Pulse 62  Temp(Src) 98 F (36.7 C) (Oral)  Resp 20  Ht 5\' 8"  (1.727 m)  Wt 126 kg (277 lb 12.5 oz)  BMI 42.24 kg/m2  SpO2 95% General appearance: alert Head: Normocephalic, without obvious abnormality, atraumatic Lungs: clear to auscultation bilaterally Heart: regular rate and rhythm, S1, S2 normal, no murmur, click, rub or gallop Abdomen: soft, non-tender; bowel sounds normal; no masses,  no organomegaly  Lab Results:  [facility] 05/28/11 0923 05/27/11 1956  NA 140 142  K 3.6 4.0  CL 100 102  CO2 28 30  GLUCOSE 159* 116*  BUN 15 16  CREATININE 0.93 0.91  CALCIUM 9.3 9.6  MG 1.8 2.0  PHOS -- --    Basename 05/28/11 0923 05/27/11 1956  AST 41* 25  ALT 16 13  ALKPHOS 59 64  BILITOT 1.2 0.7  PROT 7.2 7.5  ALBUMIN 3.6 3.7   No results found for this basename: LIPASE:2,AMYLASE:2 in the last 72 hours  Basename 05/29/11 0540 05/28/11 0923 05/27/11 1956  WBC 4.8 5.7 --  NEUTROABS -- 4.2 3.2  HGB 9.1* 8.8* --  HCT 31.1* 30.8* --  MCV 81.0 80.8 --  PLT PLATELET CLUMPS NOTED ON SMEAR, COUNT APPEARS ADEQUATE 125* --    Basename 05/28/11 1422 05/28/11 0922 05/27/11 2229  CKTOTAL 57 59 54  CKMB 1.9 1.9 2.1  CKMBINDEX -- -- --  TROPONINI <0.30 <0.30 <0.30    Basename 05/27/11 1952  POCBNP 920.8*    Basename 05/27/11 1956  DDIMER 0.57*    Basename 05/27/11 1956  HGBA1C 4.8   No results found for this basename: CHOL:2,HDL:2,LDLCALC:2,TRIG:2,CHOLHDL:2,LDLDIRECT:2 in the last 72 hours No results found for this basename: TSH,T4TOTAL,FREET3,T3FREE,THYROIDAB in the last 72 hours No results found for this basename: VITAMINB12:2,FOLATE:2,FERRITIN:2,TIBC:2,IRON:2,RETICCTPCT:2 in the last 72  hours  Studies/Results: No results found. Medications: Scheduled Meds:   . acetaminophen  1,000 mg Oral BID  . aspirin EC  81 mg Oral Daily  . benzonatate  100 mg Oral TID  . digoxin  0.25 mg Oral Daily  . ferrous sulfate  487.5 mg Oral BID  . furosemide  40 mg Intravenous Daily  . glimepiride  4 mg Oral QAC supper  . insulin aspart  0-15 Units Subcutaneous TID WC  . insulin aspart  0-5 Units Subcutaneous QHS  . metFORMIN  500 mg Oral BID WC  . metoprolol tartrate  25 mg Oral BID  . multivitamins ther. w/minerals  1 tablet Oral Daily  . omega-3 acid ethyl esters  1 g Oral BID  . pantoprazole (PROTONIX) IV  40 mg Intravenous Q12H  . potassium chloride  20 mEq Oral BID  . rosuvastatin  10 mg Oral q1800  . sitaGLIPtin  100 mg Oral Daily  . sodium chloride  3 mL Intravenous Q12H  . verapamil  80 mg Oral BID  . DISCONTD: digoxin  0.125 mg Oral Daily   Continuous Infusions:  PRN Meds:.  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 Would continue to hold coumadin. Hemodynamically stable and without signs of overt GI bleed. I have discussed with Dr. Lewayne Bunting and we will be  holding coumadin until further notice.  Anemia due to Gi bleed - improved after transfusions of 2 units of PRBCs - observe in house another night.  CAD, AFIB - stable - coumadin and BB and statin      LOS: 2 days   [name] 05/29/2011, 4:19 PM

## 2011-05-29 NOTE — Plan of Care (Signed)
Problem: Consults Goal: Heart Failure Patient Education (See Patient Education module for education specifics.)  Outcome: Completed/Met Date Met:  05/29/11 Pt already has CHF book from previous admission given [facility] nursing handout on 2 Gm Sodium diet with emphasis on foods to avoid that are high in sodium

## 2011-05-30 LAB — GLUCOSE, CAPILLARY
Glucose-Capillary: 108 mg/dL — ABNORMAL HIGH (ref 70–99)
Glucose-Capillary: 131 mg/dL — ABNORMAL HIGH (ref 70–99)

## 2011-05-30 LAB — CBC
MCV: 81.3 fL (ref 78.0–100.0)
Platelets: 108 10*3/uL — ABNORMAL LOW (ref 150–400)
RBC: 3.79 MIL/uL — ABNORMAL LOW (ref 4.22–5.81)
WBC: 5.9 10*3/uL (ref 4.0–10.5)

## 2011-05-30 LAB — PROTIME-INR: Prothrombin Time: 19.2 seconds — ABNORMAL HIGH (ref 11.6–15.2)

## 2011-05-30 MED ORDER — FERROUS SULFATE 325 (65 FE) MG PO TABS
325.0000 mg | ORAL_TABLET | Freq: Two times a day (BID) | ORAL | Status: DC
  Filled 2011-05-30 (×3): qty 1

## 2011-05-30 MED ORDER — VERAPAMIL HCL 80 MG PO TABS: 80.0000 mg | ORAL_TABLET | Freq: Three times a day (TID) | ORAL | Status: DC

## 2011-05-30 MED ORDER — FERROUS GLUCONATE IRON 246 (28 FE) MG PO TABS: 246.0000 mg | ORAL_TABLET | Freq: Three times a day (TID) | ORAL | Status: DC

## 2011-05-30 NOTE — Progress Notes (Signed)
Patient was stable upon discharge. RN gave discharge instructions and the doctor had given patient his prescriptions. Patient left hospital via a wheelchair with his wife and the transporter brought him downstairs. Patient discharged home.

## 2011-05-30 NOTE — Progress Notes (Signed)
Cosign for Gailen Shelter, RN assessment, medication, and IV charting.

## 2011-05-30 NOTE — Discharge Summary (Signed)
Physician Discharge Summary  Patient ID: [name] MRN: [id number] DOB/AGE: 12-22-1945 65 y.o. Primary Care Physician:MEYERS,STEPHEN C, MD Admit date: 05/27/2011 Discharge date: 05/30/2011    Discharge Diagnoses:  Acute blood loss anemia due to subacute gastrointestinal bleeding probably due to arteriovenous malformations - coumadin has been discontinued  Active Problems:  ANEMIA  CAD, ARTERY BYPASS GRAFT  Atrial fibrillation DM 2 Chronic thrombocytopenia - mild  Hyperlipidemia   Discharge Medication List as of 05/30/2011 12:24 PM    START taking these medications   Details  ferrous gluconate (FERGON) 246 (28 FE) MG tablet Take 1 tablet (246 mg total) by mouth 3 (three) times daily with meals., Starting 05/30/2011, Until Tue 05/29/12, Print      CONTINUE these medications which have CHANGED   Details  verapamil (CALAN) 80 MG tablet Take 1 tablet (80 mg total) by mouth 3 (three) times daily., Starting 05/30/2011, Until Discontinued, Print      CONTINUE these medications which have NOT CHANGED   Details  acetaminophen (TYLENOL) 500 MG tablet Take 1,000 mg by mouth 2 (two) times daily. , Until Discontinued, Historical Med    aspirin EC 81 MG tablet Take 1 tablet (81 mg total) by mouth daily., Starting 04/20/2011, Until Thu 04/19/12, OTC    benzonatate (TESSALON) 100 MG capsule Take 100 mg by mouth 3 (three) times daily.  , Until Discontinued, Historical Med    digoxin (LANOXIN) 0.25 MG tablet Take 1 tablet (250 mcg total) by mouth daily., Starting 04/27/2011, Until Discontinued, Normal    furosemide (LASIX) 80 MG tablet Take 1 tablet (80 mg total) by mouth daily., Starting 11/26/2010, Until Sat 11/26/11, Normal    lisinopril (PRINIVIL,ZESTRIL) 10 MG tablet Take 1 tablet (10 mg total) by mouth daily., Starting 01/03/2011, Until Discontinued, Normal    metFORMIN (GLUCOPHAGE) 1000 MG tablet Take 1,000 mg by mouth 2 (two) times daily with a meal. , Until Discontinued, Historical Med    metoprolol (LOPRESSOR) 50 MG tablet Take 50 mg by mouth 2 (two) times daily. , Starting 11/26/2010, Until Discontinued, Historical Med    Multiple Vitamin (MULTIVITAMIN) tablet Take 1 tablet by mouth daily. , Until Discontinued, Historical Med    Omega-3 Fatty Acids (FISH OIL) 1200 MG CAPS Take 1 capsule by mouth 2 (two) times daily. , Until Discontinued, Historical Med    omeprazole (PRILOSEC) 40 MG capsule Take 40 mg by mouth 2 (two) times daily.  , Until Discontinued, Historical Med    potassium chloride SA (K-DUR,KLOR-CON) 20 MEQ tablet Take 1 tablet (20 mEq total) by mouth 2 (two) times daily., Starting 11/26/2010, Until Sat 11/26/11, Normal    sitaGLIPtan (JANUVIA) 100 MG tablet Take 100 mg by mouth daily. , Until Discontinued, Historical Med    atorvastatin (LIPITOR) 20 MG tablet Take 20 mg by mouth at bedtime. , Until Discontinued, Historical Med    glimepiride (AMARYL) 4 MG tablet Take 4 mg by mouth at bedtime. , Until Discontinued, Historical Med    nitroGLYCERIN (NITROSTAT) 0.4 MG SL tablet Place 1 tablet (0.4 mg total) under the tongue every 5 (five) minutes as needed for chest pain., Starting 01/17/2011, Until Tue 01/17/12, Normal      STOP taking these medications     ferrous sulfate 324 (65 FE) MG TBEC      warfarin (COUMADIN) 5 MG tablet         Discharged Condition:good    Consults:none  Significant Diagnostic Studies: No results found.  Lab Results: Results for orders placed during  the hospital encounter of 05/27/11 (from the past 48 hour(s))  GLUCOSE, CAPILLARY     Status: Abnormal   Collection Time   05/28/11  9:33 PM      Component Value Range Comment   Glucose-Capillary 126 (*) 70 - 99 (mg/dL)   CBC     Status: Abnormal   Collection Time   05/29/11  5:40 AM      Component Value Range Comment   WBC 4.8  4.0 - 10.5 (K/uL)    RBC 3.84 (*) 4.22 - 5.81 (MIL/uL)    Hemoglobin 9.1 (*) 13.0 - 17.0 (g/dL)    HCT 16.1 (*) 09.6 - 52.0 (%)    MCV 81.0  78.0 -  100.0 (fL)    MCH 23.7 (*) 26.0 - 34.0 (pg)    MCHC 29.3 (*) 30.0 - 36.0 (g/dL)    RDW 04.5 (*) 40.9 - 15.5 (%)    Platelets    150 - 400 (K/uL)    Value: PLATELET CLUMPS NOTED ON SMEAR, COUNT APPEARS ADEQUATE  PROTIME-INR     Status: Abnormal   Collection Time   05/29/11  5:40 AM      Component Value Range Comment   Prothrombin Time 22.0 (*) 11.6 - 15.2 (seconds)    INR 1.89 (*) 0.00 - 1.49    GLUCOSE, CAPILLARY     Status: Abnormal   Collection Time   05/29/11  7:23 AM      Component Value Range Comment   Glucose-Capillary 113 (*) 70 - 99 (mg/dL)   GLUCOSE, CAPILLARY     Status: Abnormal   Collection Time   05/29/11 12:12 PM      Component Value Range Comment   Glucose-Capillary 100 (*) 70 - 99 (mg/dL)    Comment 1 Documented in Chart      Comment 2 Notify RN     OCCULT BLOOD X 1 CARD TO LAB, STOOL     Status: Normal   Collection Time   05/29/11  1:00 PM      Component Value Range Comment   Fecal Occult Bld POSITIVE     GLUCOSE, CAPILLARY     Status: Abnormal   Collection Time   05/29/11  4:27 PM      Component Value Range Comment   Glucose-Capillary 145 (*) 70 - 99 (mg/dL)    Comment 1 Documented in Chart      Comment 2 Notify RN     GLUCOSE, CAPILLARY     Status: Abnormal   Collection Time   05/29/11  9:24 PM      Component Value Range Comment   Glucose-Capillary 159 (*) 70 - 99 (mg/dL)   CBC     Status: Abnormal   Collection Time   05/30/11  5:57 AM      Component Value Range Comment   WBC 5.9  4.0 - 10.5 (K/uL)    RBC 3.79 (*) 4.22 - 5.81 (MIL/uL)    Hemoglobin 8.8 (*) 13.0 - 17.0 (g/dL)    HCT 81.1 (*) 91.4 - 52.0 (%)    MCV 81.3  78.0 - 100.0 (fL)    MCH 23.2 (*) 26.0 - 34.0 (pg)    MCHC 28.6 (*) 30.0 - 36.0 (g/dL)    RDW 78.2 (*) 95.6 - 15.5 (%)    Platelets 108 (*) 150 - 400 (K/uL) PLATELET COUNT CONFIRMED BY SMEAR  PROTIME-INR     Status: Abnormal   Collection Time   05/30/11  5:57 AM  Component Value Range Comment   Prothrombin Time 19.2 (*) 11.6 -  15.2 (seconds)    INR 1.58 (*) 0.00 - 1.49    GLUCOSE, CAPILLARY     Status: Abnormal   Collection Time   05/30/11  6:48 AM      Component Value Range Comment   Glucose-Capillary 108 (*) 70 - 99 (mg/dL)   GLUCOSE, CAPILLARY     Status: Abnormal   Collection Time   05/30/11 11:35 AM      Component Value Range Comment   Glucose-Capillary 131 (*) 70 - 99 (mg/dL)    Comment 1 Notify RN      Comment 2 Documented in Chart      No results found for this or any previous visit (from the past 240 hour(s)).   Hospital Course:  1. [name] - a 65 year old man with known recurrent gastrointestinal bleeding presented to the primary care physician's office with complaints of increased fatigue, dyspnea and dark stools. He was found to have a hemoglobin of 7.7 on admission. He was admitted to acute care units of [facility], Coumadin was discontinued and the patient was transfused 2 units of packed red blood cells. Posttransfusion his hemoglobin remained stable, and the patient had no further signs of gastrointestinal bleeding. Dr. [name] indicated that there is no need to repeat endoscopy. I have personally called Dr. Andee Lineman and he recommended that we stop Coumadin. After 48 hours of stability the patient was discharged home to followup with his primary care physician Dr. [name] and Dr. Andee Lineman  Discharge Exam: Blood pressure 122/78, pulse 71, temperature 98.2 F (36.8 C), temperature source Oral, resp. rate 20, height 5\' 8"  (1.727 m), weight 126.9 kg (279 lb 12.2 oz), SpO2 98.00%. Alert oriented x3. Heart irregularly irregular. Lungs clear to auscultation. Abdomen soft nontender. Time spent in discharging the patient 35 minutes.  Disposition: home  Discharge Orders    Future Appointments: Provider: Department: Dept Phone: Center:   06/14/2011 2:30 PM Peyton Bottoms, MD [facility] [phone] [facility]     Future Orders Please Complete By Expires   Diet - low sodium heart healthy       Increase activity slowly      Call MD for:  extreme fatigue      Call MD for:  persistant dizziness or light-headedness         Follow-up Information    Follow up with Peyton Bottoms, MD. Call in 2 weeks.   Contact information:   [address] [place] [address] [phone]       Follow up with HUNG,PATRICK D. Call in 2 weeks.   Contact information:   [address], [address] [place] [address] [phone]          Signed: Lonia Blood 05/30/2011, 4:17 PM

## 2011-06-01 NOTE — Progress Notes (Signed)
Utilization review complete  05/30/11 [name] [phone]  

## 2011-06-07 NOTE — Telephone Encounter (Signed)
Spoke with wife.  States husband had been in hospital & all questions have been answered.  Does have OV scheduled for 11/20 with GD.

## 2011-06-13 ENCOUNTER — Other Ambulatory Visit: Payer: Self-pay | Admitting: Gastroenterology

## 2011-06-13 DIAGNOSIS — R195 Other fecal abnormalities: Secondary | ICD-10-CM

## 2011-06-13 DIAGNOSIS — D500 Iron deficiency anemia secondary to blood loss (chronic): Secondary | ICD-10-CM

## 2011-06-14 ENCOUNTER — Ambulatory Visit (INDEPENDENT_AMBULATORY_CARE_PROVIDER_SITE_OTHER): Payer: Managed Care, Other (non HMO) | Admitting: Cardiology

## 2011-06-14 ENCOUNTER — Encounter: Payer: Self-pay | Admitting: Cardiology

## 2011-06-14 VITALS — BP 127/73 | HR 73 | Ht 68.0 in | Wt 281.1 lb

## 2011-06-14 DIAGNOSIS — I4891 Unspecified atrial fibrillation: Secondary | ICD-10-CM

## 2011-06-14 DIAGNOSIS — I25810 Atherosclerosis of coronary artery bypass graft(s) without angina pectoris: Secondary | ICD-10-CM

## 2011-06-14 DIAGNOSIS — R0989 Other specified symptoms and signs involving the circulatory and respiratory systems: Secondary | ICD-10-CM

## 2011-06-14 DIAGNOSIS — D649 Anemia, unspecified: Secondary | ICD-10-CM

## 2011-06-14 HISTORY — DX: Other specified symptoms and signs involving the circulatory and respiratory systems: R09.89

## 2011-06-14 MED ORDER — FERROUS GLUCONATE IRON 246 (28 FE) MG PO TABS: 246.0000 mg | ORAL_TABLET | Freq: Three times a day (TID) | ORAL | Status: DC

## 2011-06-14 NOTE — Assessment & Plan Note (Signed)
No recurrent chest pain with recent catheterization in July continue medical therapy

## 2011-06-14 NOTE — Assessment & Plan Note (Signed)
Bilateral carotid bruits. We'll obtain carotid Dopplers.

## 2011-06-14 NOTE — Progress Notes (Signed)
CC: Followup patient with atrial fibrillation and GI  bleeding on Coumadin  HPI:  The patient is a 65 year old male with history of coronary disease, status post coronary bypass grafting as well as vein graft stenosis with stent placement 2006. He has had multiple recurrent GI bleeding as recently with profound anemia. Had a recent hospitalization several weeks ago at [facility]. 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 Coumadin has now been discontinued. Is also on iron supplementation and has received blood transfusions. Reportedly he continues to be guaiac positive. We have made a decision to leave him off Coumadin for the time being. The patient is scheduled for an appointment at [facility] for push enteroscopy. He denies any chest pain shortness of breath orthopnea or PND.     PMH: reviewed and listed in Problem List in Electronic Records (and see below)  Allergies/SH/FHX : available in Electronic Records for review  Medications: Current Outpatient Prescriptions  Medication Sig Dispense Refill  . acetaminophen (TYLENOL) 500 MG tablet Take 1,000 mg by mouth 2 (two) times daily.       [name] aspirin EC 81 MG tablet Take 1 tablet (81 mg total) by mouth daily.      [name] atorvastatin (LIPITOR) 20 MG tablet Take 20 mg by mouth at bedtime.       . digoxin (LANOXIN) 0.25 MG tablet Take 1 tablet (250 mcg total) by mouth daily.  90 tablet  3  . ferrous gluconate (FERGON) 246 (28 FE) MG tablet Take 1 tablet (246 mg total) by mouth 3 (three) times daily with meals.  270 tablet  0  . furosemide (LASIX) 80 MG tablet Take 1 tablet (80 mg total) by mouth daily.  90 tablet  3  . glimepiride (AMARYL) 4 MG tablet Take 4 mg by mouth at bedtime.       [name] lisinopril (PRINIVIL,ZESTRIL) 10 MG tablet Take 1 tablet (10 mg total) by mouth daily.  90 tablet  3  . metFORMIN (GLUCOPHAGE) 1000 MG tablet Take 1,000 mg by mouth 2 (two) times daily with a meal.       . metoprolol (LOPRESSOR) 50 MG tablet  Take 50 mg by mouth 2 (two) times daily.       . Multiple Vitamin (MULTIVITAMIN) tablet Take 1 tablet by mouth daily.       . nitroGLYCERIN (NITROSTAT) 0.4 MG SL tablet Place 1 tablet (0.4 mg total) under the tongue every 5 (five) minutes as needed for chest pain.  25 tablet  3  . Omega-3 Fatty Acids (FISH OIL) 1200 MG CAPS Take 1 capsule by mouth 2 (two) times daily.       [name] omeprazole (PRILOSEC) 20 MG capsule Take 40 mg by mouth 2 (two) times daily.        . potassium chloride SA (K-DUR,KLOR-CON) 20 MEQ tablet Take 1 tablet (20 mEq total) by mouth 2 (two) times daily.  180 tablet  3  . sitaGLIPtan (JANUVIA) 100 MG tablet Take 100 mg by mouth daily.       . verapamil (CALAN) 80 MG tablet Take 1 tablet (80 mg total) by mouth 3 (three) times daily.  90 tablet  0    ROS: No nausea or vomiting. No fever or chills.No melena or hematochezia.No bleeding.No claudication  Physical Exam: BP 127/73  Pulse 73  Ht 5\' 8"  (1.727 m)  Wt 281 lb 1.9 oz (127.515 kg)  BMI 42.74 kg/m2 General: Well-nourished white male in no apparent distress  Neck: Normal carotid upstroke with bilateral carotid bruits. No thyromegaly nonnodular thyroid  Lungs: Clear breath sounds bilaterally without wheezing. Cardiac: Irregular and rhythm with normal S1-S2 with no murmur rubs or gallops Vascular: No edema. Normal peripheral pulses bilaterally  Skin: Warm and dry  12lead ECG: Limited bedside ECHO:N/A   Assessment and Plan

## 2011-06-14 NOTE — Assessment & Plan Note (Signed)
Patient permanent atrial fibrillation with rate control. We will discontinue Coumadin until full evaluation of the GI bleeding. Patient has required multiple transfusions and remains on iron therapy.

## 2011-06-14 NOTE — Assessment & Plan Note (Signed)
Scheduled for enteroscopy.

## 2011-06-14 NOTE — Patient Instructions (Signed)
Your physician wants you to follow-up in: 3 months. You will receive a reminder letter in the mail one-two months in advance. If you don't receive a letter, please call our office to schedule the follow-up appointment. Your physician recommends that you continue on your current medications as directed. Please refer to the Current Medication list given to you today. Your physician has requested that you have a carotid duplex. This test is an ultrasound of the carotid arteries in your neck. It looks at blood flow through these arteries that supply the brain with blood. Allow one hour for this exam. There are no restrictions or special instructions. If the results of your test are normal or stable, you will receive a letter. If they are abnormal, the nurse will contact you by phone.

## 2011-06-20 ENCOUNTER — Other Ambulatory Visit (HOSPITAL_COMMUNITY): Payer: Managed Care, Other (non HMO)

## 2011-06-27 ENCOUNTER — Other Ambulatory Visit (HOSPITAL_COMMUNITY): Payer: Managed Care, Other (non HMO)

## 2011-06-27 ENCOUNTER — Encounter (HOSPITAL_COMMUNITY)
Admission: RE | Admit: 2011-06-27 | Discharge: 2011-06-27 | Disposition: A | Discharge status: Home / Self Care | Payer: Managed Care, Other (non HMO) | Source: Ambulatory Visit | Attending: Gastroenterology | Admitting: Gastroenterology

## 2011-06-27 DIAGNOSIS — D500 Iron deficiency anemia secondary to blood loss (chronic): Secondary | ICD-10-CM

## 2011-06-27 DIAGNOSIS — R195 Other fecal abnormalities: Secondary | ICD-10-CM | POA: Insufficient documentation

## 2011-06-27 MED ORDER — TECHNETIUM TC 99M-LABELED RED BLOOD CELLS IV KIT
25.0000 | PACK | Freq: Once | INTRAVENOUS | Status: AC | PRN
  Administered 2011-06-27: 25 via INTRAVENOUS

## 2011-06-29 ENCOUNTER — Encounter (INDEPENDENT_AMBULATORY_CARE_PROVIDER_SITE_OTHER): Payer: Managed Care, Other (non HMO) | Admitting: *Deleted

## 2011-06-29 DIAGNOSIS — R0989 Other specified symptoms and signs involving the circulatory and respiratory systems: Secondary | ICD-10-CM

## 2011-06-29 DIAGNOSIS — I6529 Occlusion and stenosis of unspecified carotid artery: Secondary | ICD-10-CM

## 2011-07-06 ENCOUNTER — Other Ambulatory Visit (HOSPITAL_COMMUNITY): Payer: Managed Care, Other (non HMO)

## 2011-07-25 ENCOUNTER — Other Ambulatory Visit: Payer: Self-pay | Admitting: Cardiology

## 2011-08-26 HISTORY — PX: OTHER SURGICAL HISTORY: SHX169

## 2011-08-27 ENCOUNTER — Emergency Department (HOSPITAL_COMMUNITY): Payer: Managed Care, Other (non HMO)

## 2011-08-27 ENCOUNTER — Encounter (HOSPITAL_COMMUNITY): Payer: Self-pay | Admitting: *Deleted

## 2011-08-27 ENCOUNTER — Observation Stay (HOSPITAL_COMMUNITY)
Admission: EM | Admit: 2011-08-27 | Discharge: 2011-08-28 | Disposition: A | Discharge status: Home / Self Care | Payer: Managed Care, Other (non HMO) | Attending: Cardiology | Admitting: Cardiology

## 2011-08-27 ENCOUNTER — Other Ambulatory Visit: Payer: Self-pay

## 2011-08-27 DIAGNOSIS — K449 Diaphragmatic hernia without obstruction or gangrene: Secondary | ICD-10-CM | POA: Insufficient documentation

## 2011-08-27 DIAGNOSIS — I4891 Unspecified atrial fibrillation: Secondary | ICD-10-CM | POA: Insufficient documentation

## 2011-08-27 DIAGNOSIS — J449 Chronic obstructive pulmonary disease, unspecified: Secondary | ICD-10-CM | POA: Insufficient documentation

## 2011-08-27 DIAGNOSIS — G4733 Obstructive sleep apnea (adult) (pediatric): Secondary | ICD-10-CM | POA: Insufficient documentation

## 2011-08-27 DIAGNOSIS — Z7982 Long term (current) use of aspirin: Secondary | ICD-10-CM | POA: Insufficient documentation

## 2011-08-27 DIAGNOSIS — E669 Obesity, unspecified: Secondary | ICD-10-CM | POA: Insufficient documentation

## 2011-08-27 DIAGNOSIS — I25810 Atherosclerosis of coronary artery bypass graft(s) without angina pectoris: Secondary | ICD-10-CM | POA: Diagnosis present

## 2011-08-27 DIAGNOSIS — G4700 Insomnia, unspecified: Secondary | ICD-10-CM | POA: Insufficient documentation

## 2011-08-27 DIAGNOSIS — I10 Essential (primary) hypertension: Secondary | ICD-10-CM | POA: Insufficient documentation

## 2011-08-27 DIAGNOSIS — E119 Type 2 diabetes mellitus without complications: Secondary | ICD-10-CM | POA: Insufficient documentation

## 2011-08-27 DIAGNOSIS — E782 Mixed hyperlipidemia: Secondary | ICD-10-CM | POA: Insufficient documentation

## 2011-08-27 DIAGNOSIS — I509 Heart failure, unspecified: Secondary | ICD-10-CM | POA: Insufficient documentation

## 2011-08-27 DIAGNOSIS — D649 Anemia, unspecified: Secondary | ICD-10-CM

## 2011-08-27 DIAGNOSIS — I2510 Atherosclerotic heart disease of native coronary artery without angina pectoris: Secondary | ICD-10-CM | POA: Insufficient documentation

## 2011-08-27 DIAGNOSIS — Z79899 Other long term (current) drug therapy: Secondary | ICD-10-CM | POA: Insufficient documentation

## 2011-08-27 DIAGNOSIS — K5520 Angiodysplasia of colon without hemorrhage: Secondary | ICD-10-CM

## 2011-08-27 DIAGNOSIS — J4489 Other specified chronic obstructive pulmonary disease: Secondary | ICD-10-CM | POA: Insufficient documentation

## 2011-08-27 DIAGNOSIS — K219 Gastro-esophageal reflux disease without esophagitis: Secondary | ICD-10-CM | POA: Insufficient documentation

## 2011-08-27 DIAGNOSIS — R079 Chest pain, unspecified: Principal | ICD-10-CM

## 2011-08-27 HISTORY — DX: Personal history of other diseases of the digestive system: Z87.19

## 2011-08-27 HISTORY — DX: Gastro-esophageal reflux disease without esophagitis: K21.9

## 2011-08-27 HISTORY — DX: Chronic obstructive pulmonary disease, unspecified: J44.9

## 2011-08-27 HISTORY — DX: Essential (primary) hypertension: I10

## 2011-08-27 LAB — CARDIAC PANEL(CRET KIN+CKTOT+MB+TROPI)
Relative Index: INVALID (ref 0.0–2.5)
Relative Index: INVALID (ref 0.0–2.5)
Total CK: 58 U/L (ref 7–232)
Troponin I: <0.3 ng/mL (ref <0.30)
Troponin I: <0.3 ng/mL (ref <0.30)

## 2011-08-27 LAB — GLUCOSE, CAPILLARY
Glucose-Capillary: 104 mg/dL — ABNORMAL HIGH (ref 70–99)
Glucose-Capillary: 126 mg/dL — ABNORMAL HIGH (ref 70–99)

## 2011-08-27 LAB — CBC
HCT: 34.5 % — ABNORMAL LOW (ref 39.0–52.0)
MCH: 24.8 pg — ABNORMAL LOW (ref 26.0–34.0)
MCHC: 31 g/dL (ref 30.0–36.0)
MCV: 79.9 fL (ref 78.0–100.0)
Platelets: 127 10*3/uL — ABNORMAL LOW (ref 150–400)
RDW: 17.9 % — ABNORMAL HIGH (ref 11.5–15.5)
WBC: 6.4 10*3/uL (ref 4.0–10.5)

## 2011-08-27 LAB — COMPREHENSIVE METABOLIC PANEL
AST: 21 U/L (ref 0–37)
Albumin: 3.9 g/dL (ref 3.5–5.2)
BUN: 15 mg/dL (ref 6–23)
Calcium: 10 mg/dL (ref 8.4–10.5)
Chloride: 103 mEq/L (ref 96–112)
Creatinine, Ser: 0.79 mg/dL (ref 0.50–1.35)
Total Bilirubin: 0.6 mg/dL (ref 0.3–1.2)

## 2011-08-27 LAB — D-DIMER, QUANTITATIVE: D-Dimer, Quant: 2.65 ug/mL-FEU — ABNORMAL HIGH (ref 0.00–0.48)

## 2011-08-27 LAB — LIPASE, BLOOD: Lipase: 40 U/L (ref 11–59)

## 2011-08-27 LAB — POCT I-STAT TROPONIN I: Troponin i, poc: 0.02 ng/mL (ref 0.00–0.08)

## 2011-08-27 MED ORDER — ONDANSETRON HCL 4 MG/2ML IJ SOLN: 4.0000 mg | Freq: Four times a day (QID) | INTRAMUSCULAR | Status: DC | PRN

## 2011-08-27 MED ORDER — SODIUM CHLORIDE 0.9 % IJ SOLN
3.0000 mL | Freq: Two times a day (BID) | INTRAMUSCULAR | Status: DC
  Administered 2011-08-27 – 2011-08-28 (×3): 3 mL via INTRAVENOUS

## 2011-08-27 MED ORDER — ASPIRIN 81 MG PO CHEW
324.0000 mg | CHEWABLE_TABLET | Freq: Once | ORAL | Status: AC
  Administered 2011-08-27: 324 mg via ORAL
  Filled 2011-08-27: qty 1

## 2011-08-27 MED ORDER — FERROUS GLUCONATE 324 (38 FE) MG PO TABS
324.0000 mg | ORAL_TABLET | Freq: Three times a day (TID) | ORAL | Status: DC
  Administered 2011-08-27 – 2011-08-28 (×3): 324 mg via ORAL
  Filled 2011-08-27 (×5): qty 1

## 2011-08-27 MED ORDER — SODIUM CHLORIDE 0.9 % IV SOLN: 250.0000 mL | INTRAVENOUS | Status: DC | PRN

## 2011-08-27 MED ORDER — LINAGLIPTIN 5 MG PO TABS
5.0000 mg | ORAL_TABLET | Freq: Every day | ORAL | Status: DC
  Administered 2011-08-27 – 2011-08-28 (×2): 5 mg via ORAL
  Filled 2011-08-27 (×2): qty 1

## 2011-08-27 MED ORDER — ROSUVASTATIN CALCIUM 10 MG PO TABS
10.0000 mg | ORAL_TABLET | Freq: Every day | ORAL | Status: DC
  Administered 2011-08-27: 10 mg via ORAL
  Filled 2011-08-27 (×2): qty 1

## 2011-08-27 MED ORDER — ACETAMINOPHEN 325 MG PO TABS
650.0000 mg | ORAL_TABLET | ORAL | Status: DC | PRN
  Administered 2011-08-28 (×2): 650 mg via ORAL
  Filled 2011-08-27 (×2): qty 2

## 2011-08-27 MED ORDER — OMEGA-3-ACID ETHYL ESTERS 1 G PO CAPS
1.0000 g | ORAL_CAPSULE | Freq: Every day | ORAL | Status: DC
  Administered 2011-08-27 – 2011-08-28 (×2): 1 g via ORAL
  Filled 2011-08-27 (×2): qty 1

## 2011-08-27 MED ORDER — METOPROLOL TARTRATE 50 MG PO TABS
50.0000 mg | ORAL_TABLET | Freq: Two times a day (BID) | ORAL | Status: DC
  Administered 2011-08-27 – 2011-08-28 (×3): 50 mg via ORAL
  Filled 2011-08-27 (×4): qty 1

## 2011-08-27 MED ORDER — DIGOXIN 250 MCG PO TABS
250.0000 ug | ORAL_TABLET | Freq: Every day | ORAL | Status: DC
  Administered 2011-08-27 – 2011-08-28 (×2): 250 ug via ORAL
  Filled 2011-08-27 (×2): qty 1

## 2011-08-27 MED ORDER — ALPRAZOLAM 0.25 MG PO TABS: 0.2500 mg | ORAL_TABLET | Freq: Two times a day (BID) | ORAL | Status: DC | PRN

## 2011-08-27 MED ORDER — ASPIRIN EC 81 MG PO TBEC
81.0000 mg | DELAYED_RELEASE_TABLET | Freq: Every day | ORAL | Status: DC
  Administered 2011-08-27 – 2011-08-28 (×2): 81 mg via ORAL
  Filled 2011-08-27 (×2): qty 1

## 2011-08-27 MED ORDER — GLIMEPIRIDE 4 MG PO TABS
4.0000 mg | ORAL_TABLET | Freq: Every day | ORAL | Status: DC
  Administered 2011-08-27: 4 mg via ORAL
  Filled 2011-08-27 (×2): qty 1

## 2011-08-27 MED ORDER — FUROSEMIDE 80 MG PO TABS
80.0000 mg | ORAL_TABLET | Freq: Every day | ORAL | Status: DC
  Administered 2011-08-27 – 2011-08-28 (×2): 80 mg via ORAL
  Filled 2011-08-27 (×2): qty 1

## 2011-08-27 MED ORDER — IOHEXOL 350 MG/ML SOLN
100.0000 mL | Freq: Once | INTRAVENOUS | Status: AC | PRN
  Administered 2011-08-27: 100 mL via INTRAVENOUS

## 2011-08-27 MED ORDER — FERROUS GLUCONATE IRON 246 (28 FE) MG PO TABS
246.0000 mg | ORAL_TABLET | Freq: Three times a day (TID) | ORAL | Status: DC
  Filled 2011-08-27 (×3): qty 1

## 2011-08-27 MED ORDER — SODIUM CHLORIDE 0.9 % IJ SOLN: 3.0000 mL | INTRAMUSCULAR | Status: DC | PRN

## 2011-08-27 MED ORDER — SIMVASTATIN 40 MG PO TABS
40.0000 mg | ORAL_TABLET | Freq: Every day | ORAL | Status: DC
  Filled 2011-08-27: qty 1

## 2011-08-27 MED ORDER — ISOSORBIDE MONONITRATE ER 30 MG PO TB24
30.0000 mg | ORAL_TABLET | Freq: Every day | ORAL | Status: DC
  Administered 2011-08-27 – 2011-08-28 (×2): 30 mg via ORAL
  Filled 2011-08-27 (×2): qty 1

## 2011-08-27 MED ORDER — LISINOPRIL 10 MG PO TABS
10.0000 mg | ORAL_TABLET | Freq: Every day | ORAL | Status: DC
  Administered 2011-08-27 – 2011-08-28 (×2): 10 mg via ORAL
  Filled 2011-08-27 (×2): qty 1

## 2011-08-27 MED ORDER — ZOLPIDEM TARTRATE 5 MG PO TABS: 10.0000 mg | ORAL_TABLET | Freq: Every evening | ORAL | Status: DC | PRN

## 2011-08-27 MED ORDER — ADULT MULTIVITAMIN W/MINERALS CH
1.0000 | ORAL_TABLET | Freq: Every day | ORAL | Status: DC
  Administered 2011-08-27 – 2011-08-28 (×2): 1 via ORAL
  Filled 2011-08-27 (×2): qty 1

## 2011-08-27 MED ORDER — VERAPAMIL HCL 80 MG PO TABS
80.0000 mg | ORAL_TABLET | Freq: Three times a day (TID) | ORAL | Status: DC
  Administered 2011-08-27 – 2011-08-28 (×3): 80 mg via ORAL
  Filled 2011-08-27 (×6): qty 1

## 2011-08-27 MED ORDER — PANTOPRAZOLE SODIUM 40 MG PO TBEC
40.0000 mg | DELAYED_RELEASE_TABLET | Freq: Every day | ORAL | Status: DC
  Administered 2011-08-27 – 2011-08-28 (×2): 40 mg via ORAL
  Filled 2011-08-27: qty 1

## 2011-08-27 MED ORDER — NITROGLYCERIN 0.4 MG SL SUBL
0.4000 mg | SUBLINGUAL_TABLET | SUBLINGUAL | Status: DC | PRN
  Administered 2011-08-27 (×2): 0.4 mg via SUBLINGUAL
  Filled 2011-08-27: qty 75

## 2011-08-27 MED ORDER — POTASSIUM CHLORIDE CRYS ER 20 MEQ PO TBCR
20.0000 meq | EXTENDED_RELEASE_TABLET | Freq: Two times a day (BID) | ORAL | Status: DC
  Administered 2011-08-27 – 2011-08-28 (×3): 20 meq via ORAL
  Filled 2011-08-27 (×4): qty 1

## 2011-08-27 NOTE — ED Notes (Signed)
Patient states has been having chest pain since at [facility] earlier today rates it a 4/10 medicated with one NTG and will follow up

## 2011-08-27 NOTE — ED Notes (Signed)
Report given to Vernona Rieger, RN waiting for admitting to evaluate patient and write orders

## 2011-08-27 NOTE — Progress Notes (Signed)
Positive hemoccult stool, md aware.

## 2011-08-27 NOTE — ED Provider Notes (Signed)
History     CSN: [id number]  Arrival date & time 08/27/11  0047   First MD Initiated Contact with Patient 08/27/11 [phone]      Chief Complaint  Patient presents with  . Chest Pain    (Consider location/radiation/quality/duration/timing/severity/associated sxs/prior treatment) HPI Patient presents with complaint of chest pain. He states the pain began approximately 11 PM while he was seated in his chair watching television. Pain is located in his mid sternal region and he does have some radiation of pain to the right shoulder and right upper back.  Patient has a history of CABG as well as cardiac stent placement in the past. He states that the pain tonight is somewhat different compared to prior chest pain. He did have an upper endoscopy yesterday at [facility]. There were no complications that he was aware of and he was feeling well until 11 PM tonight. He denies any abdominal pain, no vomiting or blood in his stool. There's no associated nausea shortness of breath or diaphoresis. There is nothing that makes the pain better or worse. Patient took 4 baby aspirin prior to arrival of EMS at the direction of the 911 operator. There no other alleviating or modifying factors. There no other associated systemic symptoms.  Past Medical History  Diagnosis Date  . Overweight   . Coronary atherosclerosis of artery bypass graft   . Other and unspecified hyperlipidemia   . Atrial fibrillation   . Shortness of breath   . Type II or unspecified type diabetes mellitus without mention of complication, not stated as uncontrolled   . OSA (obstructive sleep apnea)   . Insomnia   . Carotid bruit 06/14/2011  . Angina   . Hypertension   . CHF (congestive heart failure)   . GERD (gastroesophageal reflux disease)   . COPD (chronic obstructive pulmonary disease)   . Anemia   . H/O hiatal hernia     Past Surgical History  Procedure Date  . Coronary artery bypass graft  10/15/2002     Salvatore Decent. Dorris Fetch,  M.D.     . Carpal tunnel release   10/08/2003  . Lipoma surgery   . Hernia repair     Family History  Problem Relation Age of Onset  . Coronary artery disease      FAMILY HISTORY    History  Substance Use Topics  . Smoking status: Former Smoker    Quit date: 07/25/2000  . Smokeless tobacco: Former Neurosurgeon  . Alcohol Use: 0.6 oz/week    1 Shots of liquor per week     Occasionally      Review of Systems ROS reviewed and otherwise negative except for mentioned in HPI  Allergies  Codeine; Diltiazem hcl; and Niacin  Home Medications   Current Outpatient Rx  Name Route Sig Dispense Refill  . ACETAMINOPHEN 500 MG PO TABS Oral Take 1,000 mg by mouth 2 (two) times daily.     . ASPIRIN EC 81 MG PO TBEC Oral Take 1 tablet (81 mg total) by mouth daily.    . ATORVASTATIN CALCIUM 20 MG PO TABS Oral Take 20 mg by mouth at bedtime.     [name] DIGOXIN 0.25 MG PO TABS Oral Take 1 tablet (250 mcg total) by mouth daily. 90 tablet 3  . FERROUS GLUCONATE IRON 246 (28 FE) MG PO TABS Oral Take 1 tablet (246 mg total) by mouth 3 (three) times daily with meals. 270 tablet 0  . FUROSEMIDE 80 MG PO TABS Oral Take 1  tablet (80 mg total) by mouth daily. 90 tablet 3  . GLIMEPIRIDE 4 MG PO TABS Oral Take 4 mg by mouth at bedtime.     [name] LISINOPRIL 10 MG PO TABS Oral Take 1 tablet (10 mg total) by mouth daily. 90 tablet 3  . METFORMIN HCL 1000 MG PO TABS Oral Take 1,000 mg by mouth 2 (two) times daily with a meal.     . METOPROLOL TARTRATE 50 MG PO TABS Oral Take 50 mg by mouth 2 (two) times daily.     [name] ONE-DAILY MULTI VITAMINS PO TABS Oral Take 1 tablet by mouth daily.     [name] NITROGLYCERIN 0.4 MG SL SUBL Sublingual Place 1 tablet (0.4 mg total) under the tongue every 5 (five) minutes as needed for chest pain. 25 tablet 3  . FISH OIL 1200 MG PO CAPS Oral Take 1 capsule by mouth 2 (two) times daily.     [name] OMEPRAZOLE 20 MG PO CPDR Oral Take 40 mg by mouth 2 (two) times daily.      [name] POTASSIUM CHLORIDE CRYS ER 20  MEQ PO TBCR Oral Take 1 tablet (20 mEq total) by mouth 2 (two) times daily. 180 tablet 3  . SITAGLIPTIN PHOSPHATE 100 MG PO TABS Oral Take 100 mg by mouth daily.     [name] VERAPAMIL HCL 80 MG PO TABS  TAKE 1 TABLET THREE TIMES A DAY 270 tablet 3  . ISOSORBIDE MONONITRATE ER 30 MG PO TB24 Oral Take 1 tablet (30 mg total) by mouth daily. 30 tablet 11    BP 135/74  Pulse 75  Temp(Src) 98.4 F (36.9 C) (Oral)  Resp 18  Wt 277 lb 9 oz (125.9 kg)  SpO2 97% Vitals reviewed Physical Exam Physical Examination: General appearance - alert, well appearing, and in no distress Mental status - alert, oriented to person, place, and time Mouth - mucous membranes moist, pharynx normal without lesions Chest - clear to auscultation, no wheezes, rales or rhonchi, symmetric air entry Heart - normal rate, regular rhythm, normal S1, S2, no murmurs, rubs, clicks or gallops Abdomen - soft, nontender, nondistended, no masses or organomegaly Neurological - alert, oriented, normal speech, no focal findings Musculoskeletal - no joint tenderness, deformity or swelling Extremities - peripheral pulses normal, mild bilateral pedal edema- right greater than left (chronic per patient), no clubbing or cyanosis Skin - normal coloration and turgor, no rashes, no suspicious skin lesions noted  ED Course  Procedures (including critical care time)   Date: 08/27/2011  Rate: 100  Rhythm: atrial fibrillation  QRS Axis: normal  Intervals: indeterminate, afib  ST/T Wave abnormalities: nonspecific ST/T changes  Conduction Disutrbances:none  Narrative Interpretation:   Old EKG Reviewed: unchanged, no significant changes compared to 05/29/11- rate faster   3:23 AM pt chest pain free, after 2 nitroglycerin  Labs Reviewed  CBC - Abnormal; Notable for the following:    Hemoglobin 10.7 (*)    HCT 34.5 (*)    MCH 24.8 (*)    RDW 17.9 (*)    Platelets 127 (*)    All other components within normal limits  COMPREHENSIVE  METABOLIC PANEL - Abnormal; Notable for the following:    Glucose, Bld 148 (*)    All other components within normal limits  D-DIMER, QUANTITATIVE - Abnormal; Notable for the following:    D-Dimer, Quant 2.65 (*)    All other components within normal limits  GLUCOSE, CAPILLARY - Abnormal; Notable for the following:    Glucose-Capillary 104 (*)  All other components within normal limits  CBC - Abnormal; Notable for the following:    RBC 4.06 (*)    Hemoglobin 9.9 (*)    HCT 32.2 (*)    MCH 24.4 (*)    RDW 18.1 (*)    Platelets 121 (*)    All other components within normal limits  GLUCOSE, CAPILLARY - Abnormal; Notable for the following:    Glucose-Capillary 126 (*)    All other components within normal limits  GLUCOSE, CAPILLARY - Abnormal; Notable for the following:    Glucose-Capillary 118 (*)    All other components within normal limits  LIPASE, BLOOD  POCT I-STAT TROPONIN I  CARDIAC PANEL(CRET KIN+CKTOT+MB+TROPI)  CARDIAC PANEL(CRET KIN+CKTOT+MB+TROPI)  CARDIAC PANEL(CRET KIN+CKTOT+MB+TROPI)  BASIC METABOLIC PANEL  OCCULT BLOOD X 1 CARD TO LAB, STOOL  GLUCOSE, CAPILLARY  OCCULT BLOOD X 1 CARD TO LAB, STOOL   Dg Chest 2 View  08/27/2011  *RADIOLOGY REPORT*  Clinical Data: Chest pain  CHEST - 2 VIEW  Comparison: 03/30/2011  Findings: Cardiomegaly.  Central vascular congestion.  Status post median sternotomy and CABG.  Mild interstitial prominence and lung base scarring versus atelectasis.  Mild hyperinflation.  Multilevel degenerative changes.  No acute osseous abnormality.  No pleural effusion or pneumothorax.  IMPRESSION: Cardiomegaly with central vascular congestion.  Status post median sternotomy and CABG.  Mild lung base scarring versus atelectasis.  Original Report Authenticated By: Waneta Martins, M.D.   Ct Angio Chest W/cm &/or Wo Cm  08/27/2011  *RADIOLOGY REPORT*  Clinical Data: Chest pain  CT ANGIOGRAPHY CHEST  Technique:  Multidetector CT imaging of the chest  using the standard protocol during bolus administration of intravenous contrast. Multiplanar reconstructed images including MIPs were obtained and reviewed to evaluate the vascular anatomy.  Contrast:  Comparison: 08/26/2010 radiograph, 02/16/2006 CT, 05/23/2007 CT.  Findings: Suboptimal contrast bolus timing.  No main or lobar branch pulmonary arterial branch filling defect. Coronary artery calcification.  Aortic valve calcification.  Status post median sternotomy and CABG.  Heart size upper normal limits to mildly enlarged.  Low attenuation of the left ventricular apex suggests prior infarct.  No significant myocardial thinning or aneurysmal dilatation.  There are prominent hilar and mediastinal lymph nodes index right subcarinal lymph node measures 1.5 cm short axis, 1.2 cm in 2007 and sub centimeter in 2008.  A right hilar lymph node measures 1.6 cm short axis, unchanged from 2007 however was 1.3 cm in 2008. Numerous additional lymph nodes are similar to slightly larger in the interval as well.  Apical scarring.  Focal areas of air trapping.  Numerous bilateral nodules, most of which are calcified, in keeping with prior granulomatous infection.  Linear lung base opacities; atelectasis, edema, or scarring.  Limited images through the upper abdomen demonstrate ascites and a small subcapsular low attenuation perisplenic collection.  Multilevel degenerative changes of the imaged spine. No acute or aggressive appearing osseous lesion.  IMPRESSION: Suboptimal contrast bolus timing.  No main or lobar branch pulmonary arterial filling defect.  The segmental and more peripheral branches are inadequately evaluated.  Sequelae of prior granulomas infection.  Trace right pleural effusion with associated consolidation; atelectasis versus infiltrate.  Areas of air trapping.  Mediastinal lymphadenopathy is nonspecific.  May be reactive, particularly given the similar appearance in 2007 and resolution in 2008.  A 3-6 month  follow-up is recommended to document resolution in this case.  Abdominal ascites has developed in the interval.  Unchanged small subcapsular perisplenic collection.  Original Report  Authenticated By: Waneta Martins, M.D.   7:53 AM d/w Dr. Mayford Knife, [facility] cardiology- she will see patient in the ED, pt remains chest pain free.    1. Chest pain   2. Atrial fibrillation   3. CAD (coronary artery disease)   4. Anemia, unspecified   5. Coronary atherosclerosis of artery bypass graft   6. Small bowel arteriovenous malformation   7. CAD, ARTERY BYPASS GRAFT       MDM  Patient with history of atrial fibrillation, CABG, cardiac stent presenting with midsternal chest pain. Workup tonight reveals no acute changes in his EKG, chest x-ray and laboratory evaluation was reassuring with the exception of elevated d-dimer. CT angiography chest was obtained which did not show any pulmonary embolism. Patient was chest pain-free after 2 nitroglycerin in the ED period cardiology has been consult and Dr. Mayford Knife will see patient in the ED.  Pt is at risk for ACS, or unstable angina due to his prior history.  Will need admission for further evaluation.          Ethelda Chick, MD 08/28/11 [phone]

## 2011-08-27 NOTE — ED Notes (Signed)
Saw Dr. Mayford Knife, asked about status of admitting orders, reporting Dr. Diona Browner will be down to write orders. Notified pt has bed on 3700.

## 2011-08-27 NOTE — ED Notes (Signed)
CBG 104 Rn notified Maralyn Sago

## 2011-08-27 NOTE — ED Notes (Signed)
Per EMS: pt c/o CP that began around 1130 pm tonight.  States pain came on gradually.  Had an endoscopy today.  Hx of AFib, not on Coumadin, stopped in 11/12 for GI bleeding.  Pain in center of chest.

## 2011-08-27 NOTE — ED Notes (Signed)
Pain decreased to 2/10 second NTG given will cont to monitor

## 2011-08-27 NOTE — ED Notes (Signed)
Admitting at bedside 

## 2011-08-27 NOTE — ED Notes (Signed)
Bed ready-admitting coming down to see patient. Pt has no orders at this time. Waiting for admitting orders

## 2011-08-27 NOTE — H&P (Signed)
History and Physical   Patient ID: [name] MRN: [id number], DOB/AGE: Nov 30, 1945   Admit date: 08/27/2011 Date of Consult: 08/27/2011   Primary Physician: Cynda Familia, MD, MD Primary Cardiologist: Earnestine Leys, Michelle Piper, MD  Pt. 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   Problem List: Past Medical History  Diagnosis Date  . Overweight   . Coronary atherosclerosis of artery bypass graft   . Other and unspecified hyperlipidemia   . Atrial fibrillation   . Shortness of breath   . Type II or unspecified type diabetes mellitus without mention of complication, not stated as uncontrolled   . OSA (obstructive sleep apnea)   . Insomnia   . Carotid bruit 06/14/2011    Past Surgical History  Procedure Date  . Coronary artery bypass graft  10/15/2002     [name]. Dorris Fetch, M.D.     . Carpal tunnel release   10/08/2003  . Lipoma surgery      Allergies:  Allergies  Allergen Reactions  . Codeine Other (See Comments)    Hurting in chest  . Diltiazem Hcl Itching  . Niacin Other (See Comments)    Hot flashes    HPI:   He reportedly had an enteroscopy on 02/01 at [facility] revealing "three-four AVMs." He has had multiple colonoscopies and capsule studies in the past to evaluate for GIB source. He reports chest pain occurring around 10 PM without exertion. He was sitting and watching a movie. He describes the pain as dull, constant, rated at a 8/10 and  substernal radiating to R back and shoulder blade. He called 911, and was advised to take ASA 81 x 4 without alleviation in the pain. Did not take NTG. He denies similarity in quality to prior angina. He has baseline DOE and palpitations. He denies recent prior episodes of chest pain, increased DOE, SOB, lightheadedness, diaphoresis, orthopnea, PND, LE edema, signs of active bleeding, or extended travel. He reports trace edema, 1 sick contact and having a "cold a couple weeks ago with nagging cough."   He was transported by EMS. Upon arrival to the ED, he was given NTG SL x 2 which relieved the pain. POC TnI 0.02, he has been chest pain free since. D-Dimer elevated at 2.65, CT-A without evidence of PE, however, abdominal ascites and R pleural effusion with associated consolidation noted. H/H at 10.7/34.5.   Inpatient Medications:     . aspirin  324 mg Oral Once    (Not in a hospital admission)  Family History  Problem Relation Age of Onset  . Coronary artery disease      FAMILY HISTORY     History   Social History  . Marital Status: Married    Spouse Name: N/A    Number of Children: 3  . Years of Education: N/A   Occupational History  . ASSISTANT LEAD    Social History Main Topics  . Smoking status: Former Smoker    Quit date: 07/25/2000  . Smokeless tobacco:  Former Neurosurgeon  . Alcohol Use: Yes     Occasionally  . Drug Use: No  . Sexually Active: Not on file   Other Topics Concern  . Not on file   Social History Narrative   Lives in [place], [place] with wife.      Review of Systems: General: negative for chills, fever, night sweats or weight changes.  Cardiovascular: positive for chest pain, dyspnea on exertion, palpitations and SOB, negative for edema, orthopnea or paroxysmal nocturnal dyspnea Dermatological: negative for rash Respiratory: positive for cough, negative for wheezing Urologic: negative for hematuria Abdominal: negative for nausea, vomiting, diarrhea,  bright red blood per rectum, melena, or hematemesis Neurologic: negative for visual changes, syncope, or dizziness All other systems reviewed and are otherwise negative except as noted above.  Physical Exam: Blood pressure 133/73, pulse 70, temperature 98.7 F (37.1 C), temperature source Oral, resp. rate 18, SpO2 96.00%.   General: Obese, NAD Head: Normocephalic, atraumatic, sclera non-icteric, no xanthomas Neck: Supple. Negative for carotid bruits, radiation of systolic murmur noted. JVD not elevated. Lungs: Clear bilaterally to auscultation without wheezes, rales, or rhonchi. Breathing is unlabored. Heart: Irregularly irregular with clear S1 S2. II/VI systolic crescendo-decrescendo murmur at RUSB radiating to carotids. No rubs, or gallops appreciated. Abdomen: Obese, soft, non-tender, non-distended with normoactive bowel sounds. No hepatomegaly. No rebound/guarding. No obvious abdominal masses. Msk:  Strength and tone appears normal for age. Extremities: Bilateral LE hyperpigmentation, trace pedal edema, no clubbing and cyanosis, Distal pedal pulses are 2+ and equal bilaterally. Neuro: Alert and oriented X 3. Moves all extremities spontaneously. Psych:  Responds to questions appropriately with a normal affect.  Labs: Recent Labs  Basename 08/27/11 0245   WBC 6.4   HGB 10.7*   HCT 34.5*   MCV 79.9   PLT 127*   Recent Labs  [facility] 08/27/11 0245   DDIMER 2.65*    Lab 08/27/11 0245  NA 137  K 4.3  CL 103  CO2 24  BUN 15  CREATININE 0.79  CALCIUM 10.0  PROT 7.9  BILITOT 0.6  ALKPHOS 78  ALT 15  AST 21  AMYLASE --  LIPASE 40  GLUCOSE 148*    Radiology/Studies: Dg Chest 2 View  08/27/2011  *RADIOLOGY REPORT*  Clinical Data: Chest pain  CHEST - 2 VIEW  Comparison: 03/30/2011  Findings: Cardiomegaly.  Central vascular congestion.  Status post median sternotomy and CABG.  Mild interstitial prominence and lung base scarring versus atelectasis.  Mild hyperinflation.   Multilevel degenerative changes.  No acute osseous abnormality.  No pleural effusion or pneumothorax.  IMPRESSION: Cardiomegaly with central vascular congestion.  Status post median sternotomy and CABG.  Mild lung base scarring versus atelectasis.  Original Report Authenticated By: Waneta Martins, M.D.   Ct Angio Chest W/cm &/or Wo Cm  08/27/2011  *RADIOLOGY REPORT*  Clinical Data: Chest pain  CT ANGIOGRAPHY CHEST  Technique:  Multidetector CT imaging of the chest using the standard protocol during bolus administration of intravenous contrast. Multiplanar reconstructed images including MIPs were obtained and reviewed to evaluate the vascular anatomy.  Contrast:  Comparison: 08/26/2010 radiograph, 02/16/2006 CT, 05/23/2007 CT.  Findings: Suboptimal contrast bolus timing.  No main or lobar branch pulmonary arterial branch filling defect. Coronary artery calcification.  Aortic valve calcification.  Status post median sternotomy and CABG.  Heart size upper normal limits to mildly enlarged.  Low attenuation of the left ventricular apex suggests prior infarct.  No significant myocardial thinning or aneurysmal dilatation.  There are prominent  hilar and mediastinal lymph nodes index right subcarinal lymph node measures 1.5 cm short axis, 1.2 cm in 2007 and sub centimeter in 2008.  A right hilar lymph node measures 1.6 cm short axis, unchanged from 2007 however was 1.3 cm in 2008. Numerous additional lymph nodes are similar to slightly larger in the interval as well.  Apical scarring.  Focal areas of air trapping.  Numerous bilateral nodules, most of which are calcified, in keeping with prior granulomatous infection.  Linear lung base opacities; atelectasis, edema, or scarring.  Limited images through the upper abdomen demonstrate ascites and a small subcapsular low attenuation perisplenic collection.  Multilevel degenerative changes of the imaged spine. No acute or aggressive appearing osseous lesion.  IMPRESSION:  Suboptimal contrast bolus timing.  No main or lobar branch pulmonary arterial filling defect.  The segmental and more peripheral branches are inadequately evaluated.  Sequelae of prior granulomas infection.  Trace right pleural effusion with associated consolidation; atelectasis versus infiltrate.  Areas of air trapping.  Mediastinal lymphadenopathy is nonspecific.  May be reactive, particularly given the similar appearance in 2007 and resolution in 2008.  A 3-6 month follow-up is recommended to document resolution in this case.  Abdominal ascites has developed in the interval.  Unchanged small subcapsular perisplenic collection.  Original Report Authenticated By: Waneta Martins, M.D.    EKG: atrial fibrillation, 100 bpm, ST-T wave changes in V5, V6, II, III, aVF improved from prior tracing in 11/12  ASSESSMENT AND PLAN:   1. Chest pain- pt with multiple cardiac risk factors and a significant cardiac history s/p CABG, multiple cardiac catheterizations and stress tests. Also, recently underwent endoscopy for recurrent GIBs. There have been no events leading up to the episode of chest pain last night. He has decreased DOE and activity level at baseline without recent worsening, and states he has not experienced this type of pain before. He does not relate it to prior anginal symptoms. Pt was at rest last night with chest pain lasting >2 hours. He has had no significant objective evidence of ischemia in the ED today. Given his cardiac history, we will admit for ACS rule out; however, if he rules in and undergoes catheterization revealing an ischemic source, he will likely need a complex possibly surgical procedure due to his history of recurrent GIB necessitating discontinuation of Coumadin.   - Will admit to telemetry for ACS rule out  - Cycle CEs  - Serial EKGs  - Continue ASA, BB, Lipitor, NTG SL PRN  - Will add Imdur  - Hold on Heparin   2. Recurrent GIB- no evidence of recent active bleeding. Pt  recently underwent endoscopy without a clear source of bleeding apart from several AVMs, per patient.   - Follow daily H/H  - Continue PPI  3. Permanent atrial fibrillation- rate-controlled. Not anticoagulated given history of GIB.   - Telemetry monitoring  - Continue BB, CCB  4. HTN  - Continue home antihypertensives  5. HL  - Continue home statin  6. Type 2 DM  - Hold Metformin  - Daily CBGs  - Will add SSI if CBGs uncontrolled    Signed, R. Hurman Horn, PA-C 08/27/2011, 9:55 AM    Attending note:  Patient seen and examined. Reviewed records and database as recorded by [name]. [name] is a medically complex 66 year old male patient of Dr. Andee [name] with multivessel CAD status post CABG in 2004, subsequent stent placement to radial graft in 2006, permanent atrial fibrillation, and  history of recurrent GI bleeding secondary to small bowel AVMs. He has not been able to remain on Coumadin, taken off back in November 2012 related to anemia and recurrent GI bleeds.  He just recently underwent enteroscopy yesterday and in [facility] having had prior evaluation including capsule endoscopy. Plan going forward is not entirely clear at this time. He is now being admitted to the hospital having presented with sudden onset chest discomfort last night at rest, reminiscent of prior angina. He's had no change in his baseline dyspnea on exertion or palpitations. Did report having cold symptoms a few weeks ago. No active cough or hemoptysis at this time.  Workup in the ER has shown normal troponin levels, elevated d-dimer which resulted in a CT angiogram of the chest showing no pulmonary embolus. He did have some abdominal ascites and right pleural effusion with associated consolidation. He is afebrile without leukocytosis.  On examination he is now comfortable, no recurrent chest pain. Initial episode resolved with nitroglycerin. He is afebrile, heart rate in the 70s, systolic blood pressure  ranging from 100 up to 159. Lungs exhibit diminished breath sounds at the bases, no Rales. Cardiac exam with irregularly irregular rhythm and 2-3/6 systolic murmur at the base, no gallop. Abdomen is per day and protuberant, extremities show trace edema.  Additional laboratory data included potassium 4.3, BUN 15, creatinine 0.7, AST 15, ALT 21, lipase 40, output phosphatase 78, WBC 6.4, Humulin 10.7, platelets 127, d-dimer 2.6, point-of-care troponin 0.02. ECG shows atrial fibrillation with nonspecific ST-T changes.  Patient will be admitted for observation, cycle full set of cardiac markers, and observe rhythm for rate control. Plan is to continue his current outpatient regimen which includes baby aspirin. Anticoagulation is not planned at this time in light of his recurrent GI bleeds and small bowel AVMs. This does represent a complex situation as it relates to management of his cardiac status. It is not entirely clear to me whether he would be a long-term  candidate for DAPT, were he to ultimately require cardiac catheterization and potential revascularization. Further discussion with Dr. Andee [name] would be important prior to pursuing this option. Hopefully, he will rule out for MI and remained clinically stable. In that event, would recommend medical therapy, likely discharge with close office followup.  Jonelle Sidle, M.D., F.A.C.C.

## 2011-08-27 NOTE — ED Notes (Signed)
Pt resting quietly at the time. Remains on cardiac monitor. Denies chest pain. Vital signs stable. Wife at the bedside. Pt updated on plan of care and cardiology consult. No signs of distress noted.

## 2011-08-27 NOTE — ED Notes (Signed)
Dr. Karma Ganja at the bedside to update patient and family on plan of care. Cardiology to consult at bedside. Vital signs stable. Denies chest pain at the time. Pt remains alert and oriented x4.

## 2011-08-28 LAB — BASIC METABOLIC PANEL
BUN: 12 mg/dL (ref 6–23)
CO2: 27 mEq/L (ref 19–32)
Calcium: 9.3 mg/dL (ref 8.4–10.5)
Glucose, Bld: 72 mg/dL (ref 70–99)
Potassium: 3.9 mEq/L (ref 3.5–5.1)
Sodium: 140 mEq/L (ref 135–145)

## 2011-08-28 LAB — GLUCOSE, CAPILLARY
Glucose-Capillary: 77 mg/dL (ref 70–99)
Glucose-Capillary: 118 mg/dL — ABNORMAL HIGH (ref 70–99)

## 2011-08-28 LAB — CBC
HCT: 32.2 % — ABNORMAL LOW (ref 39.0–52.0)
Hemoglobin: 9.9 g/dL — ABNORMAL LOW (ref 13.0–17.0)
MCH: 24.4 pg — ABNORMAL LOW (ref 26.0–34.0)
RBC: 4.06 MIL/uL — ABNORMAL LOW (ref 4.22–5.81)

## 2011-08-28 LAB — CARDIAC PANEL(CRET KIN+CKTOT+MB+TROPI): Troponin I: <0.3 ng/mL (ref <0.30)

## 2011-08-28 MED ORDER — ISOSORBIDE MONONITRATE ER 30 MG PO TB24: 30.0000 mg | ORAL_TABLET | Freq: Every day | ORAL | Status: DC

## 2011-08-28 NOTE — Discharge Summary (Signed)
CARDIOLOGY DISCHARGE SUMMARY   Patient ID: [name] MRN: [id number] DOB/AGE: [birth year] 66 y.o.  Admit date: 08/27/2011 Discharge date: 08/28/2011  Primary Discharge Diagnosis:  Chest pain Secondary Discharge Diagnosis:  Patient Active Problem List  Diagnoses  . HYPERLIPIDEMIA-MIXED  . OVERWEIGHT/OBESITY  . ANEMIA  . CAD, ARTERY BYPASS GRAFT  . Atrial fibrillation  . DYSPNEA  . Carotid bruit  . Chest pain  . Small bowel arteriovenous malformation   Procedures: CTA Chest, 2-view CXR  Hospital Course: [name] is a 66 year old male with a history of coronary artery disease. He had chest pain and came to the hospital where he was admitted for further evaluation and treatment.  His cardiac enzymes were negative for MI. His history was reviewed by Dr. Diona Browner. He has permanent atrial fibrillation but the rate is well-controlled on his current medications. He has a history of AVMs and multiple GI bleeds. He has a history of blood loss anemia as well as iron deficiency. For these reasons, he is not a candidate for anticoagulation with Coumadin or other medications. He does take a baby aspirin daily. His hemoglobin is low but stable. His chest CT was mildly abnormal with some mediastinal lymphadenopathy that had previously been seen but resolved. A followup CT in 3-6 months is also recommended to follow this to resolution.  Since [name] cardiac enzymes were negative for MI and his EKG did not show any acute changes, medical therapy for coronary artery disease is recommended. He had Imdur added to his medication regimen. On 08/28/2011, [name] was seen by Dr. Diona Browner. He was ambulating without chest pain or shortness of breath and is considered stable for discharge, to followup as an outpatient.  Labs:   Lab Results  Component Value Date   WBC 5.1 08/28/2011   HGB 9.9* 08/28/2011   HCT 32.2* 08/28/2011   MCV 79.3 08/28/2011   PLT 121* 08/28/2011    Lab 08/28/11 0623 08/27/11  0245  NA 140 --  K 3.9 --  CL 102 --  CO2 27 --  BUN 12 --  CREATININE 0.84 --  CALCIUM 9.3 --  PROT -- 7.9  BILITOT -- 0.6  ALKPHOS -- 78  ALT -- 15  AST -- 21  GLUCOSE 72 --    Basename 08/28/11 0019 08/27/11 1730 08/27/11 1157  CKTOTAL 61 58 54  CKMB 1.6 1.7 1.8  CKMBINDEX -- -- --  TROPONINI <0.30 <0.30 <0.30    Radiology:  2-View CXR 08/27/2011 IMPRESSION: Cardiomegaly with central vascular congestion. Status post median sternotomy and CABG. Mild lung base scarring versus atelectasis.  CTA Chest 08/27/2011 IMPRESSION: Suboptimal contrast bolus timing. No main or lobar branch pulmonary arterial filling defect. The segmental and more peripheral branches are inadequately evaluated. Sequelae of prior granulomas infection. Trace right pleural effusion with associated consolidation; atelectasis versus infiltrate. Areas of air trapping. Mediastinal lymphadenopathy is nonspecific. May be reactive, particularly given the similar appearance in 2007 and resolution in 2008. A 3-6 month follow-up is recommended to document resolution in this case. Abdominal ascites has developed in the interval. Unchanged small subcapsular perisplenic collection  EKG: ATRIAL FIBRILLATION ~ ? Atrial activity - rate 100 BORDERLINE REPOLARIZATION ABNORMALITY ~ ST dep & abnormal T BASELINE WANDER IN LEAD(S) V3  FOLLOW UP PLANS AND APPOINTMENTS Discharge Orders    Future Appointments: Provider: Department: Dept Phone: Center:   10/18/2011 10:00 AM Peyton Bottoms, MD [facility] [phone] [facility]     Medication List  As of 08/28/2011  12:33 PM   TAKE these medications         acetaminophen 500 MG tablet   Commonly known as: TYLENOL   Take 1,000 mg by mouth 2 (two) times daily.      aspirin EC 81 MG tablet   Take 1 tablet (81 mg total) by mouth daily.      atorvastatin 20 MG tablet   Commonly known as: LIPITOR   Take 20 mg by mouth at bedtime.      digoxin 0.25 MG tablet   Commonly  known as: LANOXIN   Take 1 tablet (250 mcg total) by mouth daily.      ferrous gluconate 246 (28 FE) MG tablet   Commonly known as: FERGON   Take 1 tablet (246 mg total) by mouth 3 (three) times daily with meals.      Fish Oil 1200 MG Caps   Take 1 capsule by mouth 2 (two) times daily.      furosemide 80 MG tablet   Commonly known as: LASIX   Take 1 tablet (80 mg total) by mouth daily.      glimepiride 4 MG tablet   Commonly known as: AMARYL   Take 4 mg by mouth at bedtime.     NEW isosorbide mononitrate 30 MG 24 hr tablet   Commonly known as: IMDUR   Take 1 tablet (30 mg total) by mouth daily.      lisinopril 10 MG tablet   Commonly known as: PRINIVIL,ZESTRIL   Take 1 tablet (10 mg total) by mouth daily.      metFORMIN 1000 MG tablet   Commonly known as: GLUCOPHAGE   Take 1,000 mg by mouth 2 (two) times daily with a meal.      metoprolol 50 MG tablet   Commonly known as: LOPRESSOR   Take 50 mg by mouth 2 (two) times daily.      multivitamin tablet   Take 1 tablet by mouth daily.      nitroGLYCERIN 0.4 MG SL tablet   Commonly known as: NITROSTAT   Place 1 tablet (0.4 mg total) under the tongue every 5 (five) minutes as needed for chest pain.      omeprazole 20 MG capsule   Commonly known as: PRILOSEC   Take 40 mg by mouth 2 (two) times daily.      potassium chloride SA 20 MEQ tablet   Commonly known as: K-DUR,KLOR-CON   Take 1 tablet (20 mEq total) by mouth 2 (two) times daily.      sitaGLIPtin 100 MG tablet   Commonly known as: JANUVIA   Take 100 mg by mouth daily.      verapamil 80 MG tablet   Commonly known as: CALAN   TAKE 1 TABLET THREE TIMES A DAY           Follow-up Information    Follow up with MEYERS,STEPHEN C, MD in 3 weeks. (As needed)       Follow up with Peyton Bottoms, MD. (The office will call.)    Contact information:   [address] [place] [place] [address] [phone]          BRING ALL MEDICATIONS WITH YOU TO  FOLLOW UP APPOINTMENTS  Time spent with patient to include physician time: 40 min Signed: Theodore Demark 08/28/2011, 12:33 PM Co-Sign MD

## 2011-08-28 NOTE — Progress Notes (Signed)
.     Subjective: No recurrent chest pain, no shortness of breath or palpitations.   Objective: Temp:  [97.9 F (36.6 C)-98.7 F (37.1 C)] 98.4 F (36.9 C) (02/03 0500) Pulse Rate:  [68-109] 75  (02/03 0500) Resp:  [18-20] 18  (02/03 0500) BP: (132-148)/(73-80) 135/74 mmHg (02/03 0500) SpO2:  [96 %-97 %] 97 % (02/03 0500) Weight:  [277 lb 9 oz (125.9 kg)] 277 lb 9 oz (125.9 kg) (02/03 0500)  I/O last 3 completed shifts: In: 1320 [P.O.:1320] Out: 2825 [Urine:2825]  Telemetry - Atrial fibrillation.  Exam -   General - NAD.  Lungs - Clear, nonlabored.  Cardiac - Irregularly irregular, no rub or gallop.  Extremities - No pitting.  Testing -   Lab Results  Component Value Date   WBC 5.1 08/28/2011   HGB 9.9* 08/28/2011   HCT 32.2* 08/28/2011   MCV 79.3 08/28/2011   PLT 121* 08/28/2011    Lab Results  Component Value Date   CREATININE 0.84 08/28/2011   BUN 12 08/28/2011   NA 140 08/28/2011   K 3.9 08/28/2011   CL 102 08/28/2011   CO2 27 08/28/2011    Lab Results  Component Value Date   CKTOTAL 61 08/28/2011   CKMB 1.6 08/28/2011   TROPONINI <0.30 08/28/2011    Current Medications    . aspirin EC  81 mg Oral Daily  . digoxin  250 mcg Oral Daily  . ferrous gluconate  324 mg Oral TID WC  . furosemide  80 mg Oral Daily  . glimepiride  4 mg Oral QHS  . isosorbide mononitrate  30 mg Oral Daily  . linagliptin  5 mg Oral Daily  . lisinopril  10 mg Oral Daily  . metoprolol  50 mg Oral BID  . mulitivitamin with minerals  1 tablet Oral Daily  . omega-3 acid ethyl esters  1 g Oral Daily  . pantoprazole  40 mg Oral Q1200  . potassium chloride SA  20 mEq Oral BID  . rosuvastatin  10 mg Oral q1800  . sodium chloride  3 mL Intravenous Q12H  . verapamil  80 mg Oral Q8H  . DISCONTD: ferrous gluconate  246 mg Oral TID WC  . DISCONTD: simvastatin  40 mg Oral q1800    Assessment:  1. Episode of chest pain, possibly anginal, however has not recurred, and cardiac markers are normal.  2.  Multivessel CAD status post CABG in 2004, subsequent stent placement to radial graft in 2006.  3. History of anemia with recurrent GI bleeds secondary to small bowel AVMs. Status post recent evaluation at [facility] as detailed in the history and physical. He has not been able to continue Coumadin for atrial fibrillation, nor has he been on the DAPT.  4. Permanent atrial fibrillation, rate adequately controlled.  Plan:  Discussed with patient and wife this morning. He is doing well without recurrent chest pain, cardiac markers are normal. Plan is discharge home. We will continue his present regimen with addition of Imdur 30 mg daily. Recommend scheduled followup in our [place] office within the next 2-3 weeks.   Jonelle Sidle, M.D., F.A.C.C.

## 2011-08-29 NOTE — Discharge Summary (Signed)
Please also refer to my chart note on day of discharge.

## 2011-09-09 ENCOUNTER — Telehealth: Payer: Self-pay | Admitting: Oncology

## 2011-09-09 NOTE — Telephone Encounter (Signed)
called pt and scheduled a new pt appt for 02/28.  faxed over a letter to Dr. [name] with appt d/t

## 2011-09-12 ENCOUNTER — Telehealth: Payer: Self-pay | Admitting: Oncology

## 2011-09-12 NOTE — Telephone Encounter (Signed)
Referred by Dr. [name] Dx- Chronic Anemia

## 2011-09-13 ENCOUNTER — Encounter: Payer: Self-pay | Admitting: Oncology

## 2011-09-13 DIAGNOSIS — I509 Heart failure, unspecified: Secondary | ICD-10-CM | POA: Insufficient documentation

## 2011-09-13 DIAGNOSIS — I10 Essential (primary) hypertension: Secondary | ICD-10-CM | POA: Insufficient documentation

## 2011-09-13 DIAGNOSIS — E785 Hyperlipidemia, unspecified: Secondary | ICD-10-CM | POA: Insufficient documentation

## 2011-09-13 DIAGNOSIS — D509 Iron deficiency anemia, unspecified: Secondary | ICD-10-CM | POA: Insufficient documentation

## 2011-09-20 ENCOUNTER — Encounter: Payer: Self-pay | Admitting: *Deleted

## 2011-09-20 ENCOUNTER — Telehealth: Payer: Self-pay | Admitting: *Deleted

## 2011-09-20 ENCOUNTER — Other Ambulatory Visit: Payer: Self-pay | Admitting: Cardiology

## 2011-09-20 ENCOUNTER — Ambulatory Visit (INDEPENDENT_AMBULATORY_CARE_PROVIDER_SITE_OTHER): Payer: Managed Care, Other (non HMO) | Admitting: Cardiology

## 2011-09-20 ENCOUNTER — Encounter: Payer: Self-pay | Admitting: Cardiology

## 2011-09-20 DIAGNOSIS — K7460 Unspecified cirrhosis of liver: Secondary | ICD-10-CM

## 2011-09-20 DIAGNOSIS — I2510 Atherosclerotic heart disease of native coronary artery without angina pectoris: Secondary | ICD-10-CM

## 2011-09-20 DIAGNOSIS — N62 Hypertrophy of breast: Secondary | ICD-10-CM | POA: Insufficient documentation

## 2011-09-20 DIAGNOSIS — R188 Other ascites: Secondary | ICD-10-CM

## 2011-09-20 NOTE — Progress Notes (Signed)
Peyton Bottoms, MD, [facility] ABIM Board Certified in Adult Cardiovascular Medicine,Internal Medicine and Critical Care Medicine    CC: Followup recent hospitalization for chest pain    The patient is a 66 year old male with history of coronary disease, status post coronary bypass grafting as well as vein graft stenosis with stent placement 2006. He has had multiple recurrent GI bleedings with profound anemia and last year: Required 11 units of blood transfusions. 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 Coumadin has been discontinued. He is in chronic atrial fibrillation. Of note was that the patient was hospitalized a month ago with chest pain. He ruled out for myocardial infarction. A CT scan was done which showed that he had abdominal ascites and this was seen on a CT scan of the chest. No abnormal CT scan was done. He also had nonspecific mediastinal lymphadenopathy which was stable since 2007 and 2008. However the patient was not aware that he had abdominal ascites. He reports significant bloating and has an urge to go to the bathroom every time he eats something. His bowel pattern is quite irregular. His wife has also noticed that he has lost a lot of weight and he states and upper chest. At the same token however his abdomen has gotten quite large consistent with underlying ascites. The patient is also followed by hematology for his chronic anemia related to iron deficiency.  PMH: reviewed and listed in Problem List in Electronic Records (and see below) Past Medical History  Diagnosis Date  . Overweight   . Coronary atherosclerosis of artery bypass graft   . Other and unspecified hyperlipidemia   . Atrial fibrillation     Permanent  . Shortness of breath   . Type II or unspecified type diabetes mellitus without mention of complication, not stated as uncontrolled   . OSA (obstructive sleep apnea)   . Insomnia   . Carotid bruit 06/14/2011  . Angina   . Hypertension   . CHF  (congestive heart failure)   . GERD (gastroesophageal reflux disease)   . COPD (chronic obstructive pulmonary disease)   . Iron deficiency anemia   . H/O hiatal hernia   . AVM (arteriovenous malformation)   . Hyperlipidemia    Past Surgical History  Procedure Date  . Coronary artery bypass graft  10/15/2002     [facility] Decent. Dorris Fetch, M.D.     . Carpal tunnel release   10/08/2003  . Lipoma surgery   . Hernia repair   . Other surgical history 08/26/2011    [facility],  enteroscopy , revealing "three-four AVMs."     Allergies/SH/FHX : available in Electronic Records for review  Allergies  Allergen Reactions  . Codeine Other (See Comments)    Hurting in chest  . Diltiazem Hcl Itching  . Niacin Other (See Comments)    Hot flashes   History   Social History  . Marital Status: Married    Spouse Name: N/A    Number of Children: 3  . Years of Education: N/A   Occupational History  . ASSISTANT [employer]    Social History Main Topics  . Smoking status: Former Smoker    Quit date: 07/25/2000  . Smokeless tobacco: Former Neurosurgeon  . Alcohol Use: 0.6 oz/week    1 Shots of liquor per week     Occasionally  . Drug Use: No  . Sexually Active: Yes   Other Topics Concern  . Not on file   Social History Narrative  Lives in [place], [place] with wife.    Family History  Problem Relation Age of Onset  . Coronary artery disease      FAMILY HISTORY    Medications: Current Outpatient Prescriptions  Medication Sig Dispense Refill  . acetaminophen (TYLENOL) 500 MG tablet Take 1,000 mg by mouth 2 (two) times daily.       [name] aspirin EC 81 MG tablet Take 1 tablet (81 mg total) by mouth daily.      [name] atorvastatin (LIPITOR) 20 MG tablet Take 20 mg by mouth at bedtime.       . digoxin (LANOXIN) 0.25 MG tablet Take 1 tablet (250 mcg total) by mouth daily.  90 tablet  3  . ferrous gluconate (FERGON) 246 (28 FE) MG tablet Take 1 tablet (246 mg total) by mouth 3 (three) times daily with meals.  270  tablet  0  . furosemide (LASIX) 80 MG tablet Take 1 tablet (80 mg total) by mouth daily.  90 tablet  3  . glimepiride (AMARYL) 4 MG tablet Take 4 mg by mouth at bedtime.       . isosorbide mononitrate (IMDUR) 30 MG 24 hr tablet Take 1 tablet (30 mg total) by mouth daily.  30 tablet  11  . lisinopril (PRINIVIL,ZESTRIL) 10 MG tablet Take 1 tablet (10 mg total) by mouth daily.  90 tablet  3  . metFORMIN (GLUCOPHAGE) 1000 MG tablet Take 1,000 mg by mouth 2 (two) times daily with a meal.       . metoprolol (LOPRESSOR) 50 MG tablet Take 50 mg by mouth 2 (two) times daily.       . Multiple Vitamin (MULTIVITAMIN) tablet Take 1 tablet by mouth daily.       . nitroGLYCERIN (NITROSTAT) 0.4 MG SL tablet Place 1 tablet (0.4 mg total) under the tongue every 5 (five) minutes as needed for chest pain.  25 tablet  3  . Omega-3 Fatty Acids (FISH OIL) 1200 MG CAPS Take 1 capsule by mouth 2 (two) times daily.       [name] omeprazole (PRILOSEC) 20 MG capsule Take 40 mg by mouth 2 (two) times daily.        . potassium chloride SA (K-DUR,KLOR-CON) 20 MEQ tablet Take 1 tablet (20 mEq total) by mouth 2 (two) times daily.  180 tablet  3  . sitaGLIPtan (JANUVIA) 100 MG tablet Take 100 mg by mouth daily.       . verapamil (CALAN) 80 MG tablet TAKE 1 TABLET THREE TIMES A DAY  270 tablet  3    ROS: No nausea or vomiting. No fever or chills.No melena or hematochezia.No bleeding.No claudication  Physical Exam: BP 132/70  Pulse 59  Ht 5\' 8"  (1.727 m)  Wt 276 lb (125.193 kg)  BMI 41.97 kg/m2  SpO2 98% General:well-nourished white male with prominent abdomen Neck:normal carotid upstroke and no carotid bruits.  No thyromegaly.  Nonnodular thyroid.  JVP is 6-7 cm Lungs:clear breath sounds bilaterally.  No wheezing Chest: Gynecomastia Cardiac:regular rate and rhythm with normal S1-S2.  No murmur rubs or gallops Vascular:1+ peripheral pitting edema.  Normal distal pulses Abdomen: Dilated abdominal veins and shifting dullness  consistent with ascites Skin:warm and dry Physcologic:normal affect  12lead ECG:not obtained Limited bedside ECHO:N/A  Cardiac catheterization July 2012:  1. Two-vessel coronary artery disease involving the LAD and left       circumflex.   2. Continued patency of the LIMA to LAD, vein graft to diagonal, and  free radial graft to OM.   3. Normal LV function.    Patient Active Problem List  Diagnoses  . HYPERLIPIDEMIA-MIXED  . OVERWEIGHT/OBESITY  . ANEMIA  . CAD, ARTERY BYPASS GRAFT- June 2012  1. Two-vessel coronary artery disease involving the LAD and left       circumflex.   2. Continued patency of the LIMA to LAD, vein graft to diagonal, and       free radial graft to OM.   3. Normal LV function.   . Atrial fibrillationa-permanent  . DYSPNEA  . Carotid bruit  . Chest pain  . Small bowel arteriovenous malformation-recurrent GI bleeding with multiple blood transfusions not Coumadin candidate  . Iron deficiency anemia  . AVM (arteriovenous malformation)  . Hyperlipidemia  . Hypertension  . CHF (congestive heart failure)-ejection fraction 60% by Cardioliteno definite ischemia June 2012  . Gynecomastia  . Ascites-noted by CT scan of the chest with significant splenomegaly: Rule out portal hypertension    PLAN   The patient had a recent hospitalization to [facility] for chest pain.  He ruled out for myocardial infarction.  CT scan of the chest was obtained which showed mediastinal adenopathy as well as ascites.  Clinically the patient has evidence of portal hypertension/cirrhosis with dilated abdominal veins, ascites gynecomastia and testicular atrophy.  We'll proceed with ultrasound of the abdomen to quantify ascitic fluid and will refer for paracentesis if present.SAAG to be calculated.  LFTs as well as hepatitis panel has been ordered.  Patient is apparently followed by hematology also for iron deficiency anemia and recurrent GI bleeding.  Coumadin  has been definitively discontinued.

## 2011-09-20 NOTE — Patient Instructions (Signed)
Your physician recommends that you schedule a follow-up appointment on April 25th 2013 @1 :30pm with Dr. Andee Lineman.  Your physician recommends that you return for lab work TODAY at the [facility].  Your physician has requested that you have an abdominal  duplex. During this test, an ultrasound is used to evaluate the abdomen. Allow 30 minutes for this exam. Do not eat after midnight the day before and avoid carbonated beverages.  Your physician recommends that you continue on your current medications as directed. Please refer to the Current Medication list given to you today.

## 2011-09-20 NOTE — Telephone Encounter (Signed)
No precert required.  [employer] only

## 2011-09-20 NOTE — Telephone Encounter (Signed)
ABDOMINAL US COMPLETE Scheduled for 09-23-2011 @ [facility] Checking percert

## 2011-09-21 ENCOUNTER — Encounter: Payer: Self-pay | Admitting: Oncology

## 2011-09-21 DIAGNOSIS — D696 Thrombocytopenia, unspecified: Secondary | ICD-10-CM | POA: Insufficient documentation

## 2011-09-22 ENCOUNTER — Encounter: Payer: Self-pay | Admitting: Oncology

## 2011-09-22 ENCOUNTER — Other Ambulatory Visit: Payer: Managed Care, Other (non HMO) | Admitting: Lab

## 2011-09-22 ENCOUNTER — Ambulatory Visit: Payer: Managed Care, Other (non HMO)

## 2011-09-22 ENCOUNTER — Ambulatory Visit (HOSPITAL_BASED_OUTPATIENT_CLINIC_OR_DEPARTMENT_OTHER): Payer: Managed Care, Other (non HMO) | Admitting: Oncology

## 2011-09-22 DIAGNOSIS — R590 Localized enlarged lymph nodes: Secondary | ICD-10-CM

## 2011-09-22 DIAGNOSIS — I509 Heart failure, unspecified: Secondary | ICD-10-CM

## 2011-09-22 DIAGNOSIS — I10 Essential (primary) hypertension: Secondary | ICD-10-CM

## 2011-09-22 DIAGNOSIS — D509 Iron deficiency anemia, unspecified: Secondary | ICD-10-CM

## 2011-09-22 DIAGNOSIS — E785 Hyperlipidemia, unspecified: Secondary | ICD-10-CM

## 2011-09-22 DIAGNOSIS — D696 Thrombocytopenia, unspecified: Secondary | ICD-10-CM

## 2011-09-22 DIAGNOSIS — Q279 Congenital malformation of peripheral vascular system, unspecified: Secondary | ICD-10-CM

## 2011-09-22 DIAGNOSIS — Q2730 Arteriovenous malformation, site unspecified: Secondary | ICD-10-CM

## 2011-09-22 DIAGNOSIS — M1990 Unspecified osteoarthritis, unspecified site: Secondary | ICD-10-CM | POA: Insufficient documentation

## 2011-09-22 HISTORY — DX: Localized enlarged lymph nodes: R59.0

## 2011-09-22 LAB — CBC WITH DIFFERENTIAL/PLATELET
BASO%: 0.7 % (ref 0.0–2.0)
Basophils Absolute: 0.1 10*3/uL (ref 0.0–0.1)
EOS%: 1.9 % (ref 0.0–7.0)
MCH: 23.8 pg — ABNORMAL LOW (ref 27.2–33.4)
MCHC: 30.7 g/dL — ABNORMAL LOW (ref 32.0–36.0)
MCV: 77.6 fL — ABNORMAL LOW (ref 79.3–98.0)
MONO%: 9.6 % (ref 0.0–14.0)
NEUT%: 77.1 % — ABNORMAL HIGH (ref 39.0–75.0)
RDW: 18.2 % — ABNORMAL HIGH (ref 11.0–14.6)
lymph#: 0.8 10*3/uL — ABNORMAL LOW (ref 0.9–3.3)

## 2011-09-22 LAB — COMPREHENSIVE METABOLIC PANEL
ALT: 14 U/L (ref 0–53)
AST: 20 U/L (ref 0–37)
Albumin: 4.3 g/dL (ref 3.5–5.2)
Alkaline Phosphatase: 82 U/L (ref 39–117)
BUN: 16 mg/dL (ref 6–23)
Calcium: 9.4 mg/dL (ref 8.4–10.5)
Chloride: 105 mEq/L (ref 96–112)
Potassium: 4.5 mEq/L (ref 3.5–5.3)
Sodium: 139 mEq/L (ref 135–145)
Total Protein: 7.5 g/dL (ref 6.0–8.3)

## 2011-09-22 LAB — MORPHOLOGY: PLT EST: ADEQUATE

## 2011-09-22 NOTE — Progress Notes (Signed)
[facility] INITIAL HEMATOLOGY CONSULTATION  Referral MD:  Dr. Jeani Hawking, M.D.   Reason for Referral: iron deficiency anemia from AVM.     HPI:  [name] is a 66 year-old man with numerous medical conditions including obesity, HTN, DM, HLP, CAD, CHF, afib.  He was in usual state of health until he developed severe anemia in September 2012.  Since then, extensive GI work up with colonoscopy, EGD, capsule endoscopy and even double balloon endoscopy at [facility] has shown AVM without other obvious source of bleeding.  He thinks that he did receive a dose of IV iron during one of the multiple admissions in addition to pRBC transfusion.  He has been taking oral iron.  He was kindly referred to the [facility] for further evaluation and treatment.    [name] presented to the clinic for the first time today with his wife.  He reports that he has been having fatigue for the past year or so. He has DOE on exertion walking up an incline for more than one flight of stairs.  He denies visible source of bleeding; however, he takes oral iron and has had dark stool for a while.  For the past few months, he has been having progressive enlargement of his abdominal girth which has decreasing his appetite, increasing early satiety, and SOB.   He has had CHF exacerbation in the past with worsening bilateral pedal edema and PND, orthopnea.  However, the recent problem of increasing abdominal girth is not associated with worsening of these symptoms.  He thinks that his bilateral breasts have enlarging lately without discrete mass or pain.   Patient denies headache, visual changes, confusion, drenching night sweats, palpable lymph node swelling, mucositis, odynophagia, dysphagia, nausea vomiting, jaundice, chest pain, palpitation, productive cough, gum bleeding, epistaxis, hematemesis, hemoptysis, abdominal pain, early satiety, melena, hematochezia, hematuria, skin rash, spontaneous bleeding, joint  swelling, joint pain, heat or cold intolerance, bowel bladder incontinence, back pain, focal motor weakness, paresthesia, depression, suicidal or homocidal ideation, feeling hopelessness.    Past Medical History  Diagnosis Date  . Overweight   . Coronary atherosclerosis of artery bypass graft   . Atrial fibrillation     Permanent; off of Coumadin for now due to GI bleed  . Shortness of breath   . Type II or unspecified type diabetes mellitus without mention of complication, not stated as uncontrolled   . OSA (obstructive sleep apnea)   . Insomnia   . Carotid bruit 06/14/2011  . Angina   . Hypertension   . CHF (congestive heart failure)   . GERD (gastroesophageal reflux disease)   . COPD (chronic obstructive pulmonary disease)   . Iron deficiency anemia   . H/O hiatal hernia   . AVM (arteriovenous malformation)   . Hyperlipidemia   . Thrombocytopenia   . Ascites   . Osteoarthritis   :    Past Surgical History  Procedure Date  . Coronary artery bypass graft  10/15/2002     [facility] Decent. Dorris Fetch, M.D.     . Carpal tunnel release   10/08/2003  . Lipoma surgery   . Hernia repair   . Other surgical history 08/26/2011    [facility],  enteroscopy , revealing "three-four AVMs."   :   CURRENT MEDS: Current Outpatient Prescriptions  Medication Sig Dispense Refill  . acetaminophen (TYLENOL) 500 MG tablet Take 1,000 mg by mouth 2 (two) times daily.       [name] aspirin EC 81 MG tablet  Take 1 tablet (81 mg total) by mouth daily.      [name] atorvastatin (LIPITOR) 20 MG tablet Take 20 mg by mouth at bedtime.       . digoxin (LANOXIN) 0.25 MG tablet Take 1 tablet (250 mcg total) by mouth daily.  90 tablet  3  . ferrous gluconate (FERGON) 246 (28 FE) MG tablet Take 1 tablet (246 mg total) by mouth 3 (three) times daily with meals.  270 tablet  0  . furosemide (LASIX) 80 MG tablet Take 1 tablet (80 mg total) by mouth daily.  90 tablet  3  . glimepiride (AMARYL) 4 MG tablet Take 4 mg by mouth at  bedtime.       . hyoscyamine (LEVSIN, ANASPAZ) 0.125 MG tablet Take 0.125 mg by mouth 4 (four) times daily.      . isosorbide mononitrate (IMDUR) 30 MG 24 hr tablet Take 1 tablet (30 mg total) by mouth daily.  30 tablet  11  . lisinopril (PRINIVIL,ZESTRIL) 10 MG tablet Take 1 tablet (10 mg total) by mouth daily.  90 tablet  3  . metFORMIN (GLUCOPHAGE) 1000 MG tablet Take 1,000 mg by mouth 2 (two) times daily with a meal.       . metoprolol (LOPRESSOR) 50 MG tablet Take 50 mg by mouth 2 (two) times daily.       . Multiple Vitamin (MULTIVITAMIN) tablet Take 1 tablet by mouth daily.       . nitroGLYCERIN (NITROSTAT) 0.4 MG SL tablet Place 1 tablet (0.4 mg total) under the tongue every 5 (five) minutes as needed for chest pain.  25 tablet  3  . Omega-3 Fatty Acids (FISH OIL) 1200 MG CAPS Take 1 capsule by mouth 2 (two) times daily.       [name] omeprazole (PRILOSEC) 20 MG capsule Take 40 mg by mouth 2 (two) times daily.        . potassium chloride SA (K-DUR,KLOR-CON) 20 MEQ tablet Take 1 tablet (20 mEq total) by mouth 2 (two) times daily.  180 tablet  3  . sitaGLIPtan (JANUVIA) 100 MG tablet Take 100 mg by mouth daily.       . verapamil (CALAN) 80 MG tablet TAKE 1 TABLET THREE TIMES A DAY  270 tablet  3      Allergies  Allergen Reactions  . Codeine Other (See Comments)    Hurting in chest  . Diltiazem Hcl Itching  . Niacin Other (See Comments)    Hot flashes  :  Family History  Problem Relation Age of Onset  . Coronary artery disease      FAMILY HISTORY  . Hypertension Father   . Diabetes Father   . Heart disease Father   . COPD Sister   . Cancer Maternal Aunt     Breast cancer   . Cancer Maternal Aunt     Breast cancer   . Diabetes Son   . Cancer Daughter     Cervical cancer  :  History   Social History  . Marital Status: Married    Spouse Name: N/A    Number of Children: 3  . Years of Education: N/A   Occupational History  .      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 Smoking status: Former Smoker -- 1.0 packs/day for 30 years    Quit date: 07/25/2000  . Smokeless tobacco: Former Neurosurgeon  . Alcohol Use: 0.6 oz/week    1 Shots  of liquor per week     Occasionally  . Drug Use: No  . Sexually Active: Yes   Other Topics Concern  . Not on file   Social History Narrative   Lives in [place], [place] with wife.   :  REVIEW OF SYSTEM:  The rest of the 14-point review of sytem was negative.   Exam: ECOG 2.   General:  Obese man in no acute distress.  Eyes:  no scleral icterus.  ENT:  There were no oropharyngeal lesions.  Neck was without thyromegaly.  Lymphatics:  Negative cervical, supraclavicular or axillary adenopathy.  Respiratory: lungs were clear bilaterally without wheezing or crackles.  Cardiovascular:   Irregularly irregular,  S1/S2, without  rub or gallop. There was a grade 3/6 systolic murmur best heard in the LUSB.  There was 1+ bilateral pedal edema.  GI:  abdomen was soft, flat, nontender, nondistended, without organomegaly.  Muscoloskeletal:  no spinal tenderness of palpation of vertebral spine.  Skin exam was without echymosis, petichae.  Neuro exam was nonfocal.  Patient was able to get on and off exam table without assistance.  Gait was normal.  Patient was alerted and oriented.  Attention was good.   Language was appropriate.  Mood was normal without depression.  Speech was not pressured.  Thought content was not tangential.    LABS:  Lab Results  Component Value Date   WBC 7.5 09/22/2011   HGB 10.2* 09/22/2011   HCT 33.2* 09/22/2011   PLT 158 Platelet count consistent in citrate 09/22/2011   GLUCOSE 72 08/28/2011   ALT 15 08/27/2011   AST 21 08/27/2011   NA 140 08/28/2011   K 3.9 08/28/2011   CL 102 08/28/2011   CREATININE 0.84 08/28/2011   BUN 12 08/28/2011   CO2 27 08/28/2011   TSH 2.486 03/30/2011   INR 1.58* 05/30/2011   HGBA1C 4.8 05/27/2011    Dg Chest 2 View  08/27/2011  *RADIOLOGY REPORT*  Clinical Data: Chest pain  CHEST -  2 VIEW  Comparison: 03/30/2011  Findings: Cardiomegaly.  Central vascular congestion.  Status post median sternotomy and CABG.  Mild interstitial prominence and lung base scarring versus atelectasis.  Mild hyperinflation.  Multilevel degenerative changes.  No acute osseous abnormality.  No pleural effusion or pneumothorax.  IMPRESSION: Cardiomegaly with central vascular congestion.  Status post median sternotomy and CABG.  Mild lung base scarring versus atelectasis.  Original Report Authenticated By: Waneta Martins, M.D.   Ct Angio Chest W/cm &/or Wo Cm  08/27/2011  *RADIOLOGY REPORT*  Clinical Data: Chest pain  CT ANGIOGRAPHY CHEST  Technique:  Multidetector CT imaging of the chest using the standard protocol during bolus administration of intravenous contrast. Multiplanar reconstructed images including MIPs were obtained and reviewed to evaluate the vascular anatomy.  Contrast:  Comparison: 08/26/2010 radiograph, 02/16/2006 CT, 05/23/2007 CT.  Findings: Suboptimal contrast bolus timing.  No main or lobar branch pulmonary arterial branch filling defect. Coronary artery calcification.  Aortic valve calcification.  Status post median sternotomy and CABG.  Heart size upper normal limits to mildly enlarged.  Low attenuation of the left ventricular apex suggests prior infarct.  No significant myocardial thinning or aneurysmal dilatation.  There are prominent hilar and mediastinal lymph nodes index right subcarinal lymph node measures 1.5 cm short axis, 1.2 cm in 2007 and sub centimeter in 2008.  A right hilar lymph node measures 1.6 cm short axis, unchanged from 2007 however was 1.3 cm in 2008. Numerous additional lymph nodes are  similar to slightly larger in the interval as well.  Apical scarring.  Focal areas of air trapping.  Numerous bilateral nodules, most of which are calcified, in keeping with prior granulomatous infection.  Linear lung base opacities; atelectasis, edema, or scarring.  Limited images  through the upper abdomen demonstrate ascites and a small subcapsular low attenuation perisplenic collection.  Multilevel degenerative changes of the imaged spine. No acute or aggressive appearing osseous lesion.  IMPRESSION: Suboptimal contrast bolus timing.  No main or lobar branch pulmonary arterial filling defect.  The segmental and more peripheral branches are inadequately evaluated.  Sequelae of prior granulomas infection.  Trace right pleural effusion with associated consolidation; atelectasis versus infiltrate.  Areas of air trapping.  Mediastinal lymphadenopathy is nonspecific.  May be reactive, particularly given the similar appearance in 2007 and resolution in 2008.  A 3-6 month follow-up is recommended to document resolution in this case.  Abdominal ascites has developed in the interval.  Unchanged small subcapsular perisplenic collection.  Original Report Authenticated By: Waneta Martins, M.D.   Blood smear review:   I personally reviewed the patient's peripheral blood smear today.  There was anisocytosis.  There was no peripheral blast.  There was no schistocytosis, spherocytosis, target cell, rouleaux formation, tear drop cell.  There was slight hypochromasia.  There were occasional pencil cells.  There was occasional giant platelets but no platelet clumps.      ASSESSMENT AND PLAN:   1.  HTN: under good control with Verapamil, Lisininopril, Metoprolol, and Lasix per PCP.   2.  DM:  Under good control per his report with Glimepiride; Metformin and Januvia per PCP.    3.  HLP:  He is on Lipitor per PCP.  4.  Afib:  He is rate controlled on metoprolol and verapamil.  His [place] score is high making him at high risk of embolic event.  His Coumadin has been on hold since 05/2011 due to GI bleed.  I defer to GI and Cardiology to decide when he is safe to resume Coumadin.  5.  CAD: He is in ASA, Imdur, Lisinopril, metoprolol, Lipitor.  6.  CHF:  He is on Lisinopril, metoprolol, Lasix,  Digoxin.  7.  AVM:  Extensive work up by GI.   8.  Chronic microcytic anemia of iron deficiency:  Pending iron panel today for confirmation.  However, his microcytosis, elevated RDW argue for this.  He is on oral iron with still anemia.  I recommended IV iron in the form of Feraheme to increase his Hgb and improving his quality of life. I advised him to continue oral iron and Vitamin C.  If despite iron repletion, and he still has anemia, then further work up such as bone marrow biopsy may be considered.   9.  Thrombocytopenia:  Resolved today.  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  Pending abdominal US tomorrow.  10.  Mediastinal adenopathy:  Most likely reactive.  They are still <2cm.  Compared to 2007 and 2008, there was only slight increase.  I will repeat CT chest in around August 2013 to document stability.   11.  Ascites:  No personal history of EtOH or hepatitis.  However, he does have moderate ascites on exam today.  Ddx:  Cirrhosis vs CHF.  I have low clinical for malignancy ascites.  He has abdominal US this week with possible paracentesis per cardiology.  12.  Follow up:  Lab only appointment once a month.  I'll see him in about 4 months.  The length of time of the face-to-face encounter was 45 minutes. More than 50% of time was spent counseling and coordination of care.

## 2011-09-26 ENCOUNTER — Telehealth: Payer: Self-pay | Admitting: *Deleted

## 2011-09-26 NOTE — Telephone Encounter (Signed)
I called patient personally and explained procedure. Have his wife call me in office - she has better understanding- before Friday.

## 2011-09-26 NOTE — Telephone Encounter (Signed)
Paracentesis under ultrasound guidance - scheduled for Friday, March 8 at 11:00 a.m.  Patient to arrive at 10:00 to outpatient registration.  Patient to hold Aspirin 3 days prior to procedure and NPO after MN per scheduling instructions.  Also instructed to bring medication list & insurance cards.  Patient notified of above.  Above scheduled per Dr. Andee Lineman - stated he had spoken to radiology and patient & wrote the orders, nurse just needed to schedule.    Patient seemed to only have vague understanding of procedure & I was not able to answer all completely.

## 2011-09-27 ENCOUNTER — Telehealth: Payer: Self-pay | Admitting: *Deleted

## 2011-09-27 NOTE — Telephone Encounter (Signed)
No precert required per Vera C. Ref# [id number] [employer] [phone]

## 2011-09-27 NOTE — Telephone Encounter (Signed)
Paracentesis under US guidance - scheduled for Friday, 3/8 at 11:00 at [facility]

## 2011-09-27 NOTE — Telephone Encounter (Signed)
No precert required per Vera C. Ref# [id number] [employer] [phone] 

## 2011-09-28 NOTE — Telephone Encounter (Signed)
Patient notified of below.  Dr. Andee Lineman cell phone number given to patient:  [phone].

## 2011-09-29 ENCOUNTER — Ambulatory Visit (HOSPITAL_BASED_OUTPATIENT_CLINIC_OR_DEPARTMENT_OTHER): Payer: Managed Care, Other (non HMO)

## 2011-09-29 DIAGNOSIS — Q279 Congenital malformation of peripheral vascular system, unspecified: Secondary | ICD-10-CM

## 2011-09-29 DIAGNOSIS — D509 Iron deficiency anemia, unspecified: Secondary | ICD-10-CM

## 2011-09-29 DIAGNOSIS — Q2730 Arteriovenous malformation, site unspecified: Secondary | ICD-10-CM

## 2011-09-29 MED ORDER — SODIUM CHLORIDE 0.9 % IV SOLN
1020.0000 mg | Freq: Once | INTRAVENOUS | Status: AC
  Administered 2011-09-29: 1020 mg via INTRAVENOUS
  Filled 2011-09-29: qty 34

## 2011-09-29 NOTE — Patient Instructions (Signed)
Pt aware of next appt, and knows to call office if any problems.

## 2011-09-30 ENCOUNTER — Encounter: Payer: Self-pay | Admitting: Cardiology

## 2011-10-03 ENCOUNTER — Encounter: Payer: Self-pay | Admitting: Cardiology

## 2011-10-04 ENCOUNTER — Encounter: Payer: Self-pay | Admitting: Cardiology

## 2011-10-07 ENCOUNTER — Telehealth: Payer: Self-pay | Admitting: *Deleted

## 2011-10-07 NOTE — Telephone Encounter (Signed)
Message copied by Murriel Hopper on Fri Oct 07, 2011 12:59 PM ------      Message from: [name]      Created: Fri Oct 07, 2011 12:41 PM       Najeeb            Cytology was negative for malignancy.            Loann Quill let him know no cancer was found , but he will have to wait for Dr. Cathie Beams final recommendaytion            Thanks      Michelle Piper      ----- Message -----         From: Lesle Chris, LPN         Sent: 10/06/2011  12:46 PM           To: June Leap, MD            Lupita Leash returned call from Dr. Patty Sermons office.  Appointment scheduled for Tuesday, March 19 at 9:15.  Their office has notified him of the appointment.              ----- Message -----         From: June Leap, MD         Sent: 10/04/2011   4:20 PM           To: Malissa Hippo, MD, Lesle Chris, LPN            New onset ascites noted on chest CT scan when admitted for SCP. Clinical features of cirrhosis and hepatosplenomegaly. Referred for paracentesis. 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 Can you f/u.Still low Hb and MCV very low( Fe deficient)            Thanks      Loann Quill please schedule appointment !!!            SAAG consistent with malignancy =1.0. Ascites protein almost 5 gm/dl also consistent with malignancy. I am not sure if he had abdominal CT scan but would refer to Dr. Karilyn Cota ASAP. Portal HTN due to hepatic venous outflow not fully excluded, but malignancy most likely refer ASAP. Could go ahead and order abdominal CT scan with contrast if not done yet in interim.

## 2011-10-07 NOTE — Telephone Encounter (Signed)
Dr. Andee Lineman spoke with patient himself & discussed below.

## 2011-10-11 ENCOUNTER — Telehealth (HOSPITAL_COMMUNITY): Payer: Self-pay | Admitting: Dietician

## 2011-10-11 ENCOUNTER — Ambulatory Visit (INDEPENDENT_AMBULATORY_CARE_PROVIDER_SITE_OTHER): Payer: Managed Care, Other (non HMO) | Admitting: Internal Medicine

## 2011-10-11 ENCOUNTER — Encounter (INDEPENDENT_AMBULATORY_CARE_PROVIDER_SITE_OTHER): Payer: Self-pay | Admitting: Internal Medicine

## 2011-10-11 DIAGNOSIS — R188 Other ascites: Secondary | ICD-10-CM

## 2011-10-11 DIAGNOSIS — K7460 Unspecified cirrhosis of liver: Secondary | ICD-10-CM

## 2011-10-11 MED ORDER — DOCUSATE SODIUM 100 MG PO CAPS: 100.0000 mg | ORAL_CAPSULE | Freq: Every day | ORAL | Status: DC

## 2011-10-11 MED ORDER — SPIRONOLACTONE 50 MG PO TABS: 50.0000 mg | ORAL_TABLET | Freq: Two times a day (BID) | ORAL | Status: DC

## 2011-10-11 NOTE — Patient Instructions (Addendum)
Please do not eat raw fish. Please have Dr. Izola Price give you vaccination for hepatitis A and B. Dietary consultation to be arranged to get assistance at salt restriction and calorie count in order to help you lose weight. New medication is Spirinolactone 50 mg twice daily. Stop taking potassium chloride starting  on 10/14/2011 Please gradually increase physical activity and walk twice daily starting with 5 minutes each time.

## 2011-10-11 NOTE — Consult Note (Signed)
Note dictated; Job [id number].

## 2011-10-11 NOTE — Telephone Encounter (Signed)
Appointment scheduled for 11/15/11 at 8:45 AM.

## 2011-10-13 NOTE — Progress Notes (Signed)
CONSULTING PHYSICIAN:  Dr. [name], MD,FACC  PRIMARY CARE PHYSICIAN:  [name], M.D.  REASON FOR CONSULTATION:  New onset of ascites.  HISTORY OF PRESENT ILLNESS:  [name] is a 66 year old Caucasian male with complicated history who recently underwent chest CT, angio and was found to have ascites.  He subsequently had upper abdominal ultrasound on September 23, 2011, which revealed moderate amount of ascites, splenomegaly, hepatomegaly with echogenic liver. Liver measured 20 cm.  He also had cholelithiasis.  He then underwent large volume abdominal paracentesis as recommended by Dr. Andee Lineman.  He had 4 L of fluid removed.  Fluid was a transudate.  Cultures were negative.  Cytology on this fluid revealed white cells and mesothelial cells with no evidence of malignant cells.  The patient also had hepatitis B surface antigen and hepatitis C virus antibody, both of which were negative.  The patient is here for further evaluation of his liver disease.   There is no history of jaundice or icteric hepatitis.  He has never been told in the past that his liver test has been elevated.  There is no history of blood transfusion prior to 1992.  He has had 11 units of PRBCs transfused since January 2012.  He had 4 units in January 2012 and 7 units in September 2012.  He has never had any tattoos and no history of IV drug use.  He has had problems with obesity.  His peak weight 1 year ago was 305 pounds and he has been trying to lose weight.  He states his heartburn is well controlled with therapy.  He denies dysphagia, nausea, vomiting.  He has had intermittent black stools.  However, he has not seen any bright red blood per rectum.  Lately, he has been constipated.  He also complains of exertional dyspnea.  His wife states that he sleeps too much and does not do much walking or exercise.  She states his fasting blood glucose levels generally run between 87 and 115.  Patient states that he has a good  appetite.  CURRENT MEDICATIONS:   1. Acetaminophen 1 g p.o. b.i.d. p.r.n. 2. Vitamin C 1 g p.o. b.i.d. 3. Aspirin 81 mg p.o. daily. 4. Atorvastatin 20 mg p.o. daily. 5. Digoxin 250 mcg p.o. daily. 6. Ferrous gluconate 240 mg p.o. t.i.d. with meals. 7. Furosemide 80 mg p.o. daily. 8. Glimepiride 4 mg p.o. daily. 9. Levsin SL q.6 hours p.r.n. 10. Isosorbide mononitrate 30 mg p.o. daily. 11. Lisinopril 10 mg p.o. daily. 12. Metformin 1 g p.o. b.i.d. 13. Metoprolol 50 mg p.o. b.i.d. MVI 1 p.o. daily. 14. Nitroglycerin 0.4 mg sublingual p.r.n. chest pain. 15. Omega-3 fatty acids 1.2 g p.o. b.i.d. 16. Omeprazole 40 mg p.o. b.i.d. 17. Sitagliptin 100 mg p.o. daily. 18. Verapamil 80 mg p.o. t.i.d.  PAST MEDICAL HISTORY:  He has coronary artery disease.  He had coronary artery bypass graft in 2004.  He had 1 of the arteries stented in 2006.  He had cardiac cath last year and his LV function was normal.   He has atrial fibrillation, which was diagnosed in 2003.  He had been on Coumadin, which was discontinued because of recurrent GI bleed and need for transfusion.   Obesity. Peak weight was 305 pounds 1 year ago.   He has been diabetic for 13 years.  He states his control has been much better lately.   He has chronic GERD for at least 10 years.  He has hypertension and hyperlipidemia.  Mild COPD.  He had PFTs done in August 2013 and read by Dr. Juanetta Gosling.  He also has obstructive sleep apnea, but CPAP has not helped him.  History of CHF according to his chart.   He has history of GI bleed, would need for multiple transfusions.  His workup was in [place] by Dr. Arliss Journey and he also was seen by Dr. Marina Goodell of [facility].  On one of the exams, he had ulcerated gastric polyp, but his bleeding continued.  He has undergone followup EGD, colonoscopy, as well as two small bowel given capsule study.  In September and October, he had double balloon enteroscopy by Dr. Epimenio Sarin of [facility] earlier  this year and no lesion was found.  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   Mediastinal adenopathy since 2008 and has remained stable, most recently evaluated on a chest CT angio.   He had decompression for left carpal tunnel syndrome.   Right inguinal herniorrhaphy in 1996.   Years ago, he had lipoma removed from his back.   History of carotid bruits.   He has seen Dr. Maia Plan, Hem/Onc and received iron infusion on September 29, 2011, and he had a followup visit on October 20, 2011.  ALLERGIES:  CODEINE, NIACIN, and he developed pruritus with DILTIAZEM.  FAMILY HISTORY:  Father is 58 year old.  He has diabetes and Parkinson disease and doing fair.  Mother had a heart disease and lupus and died at 76.  He has 2 brothers in good health.   He has 2 sisters, one has bad COPD and osteoporosis, and other one has coronary artery disease.  SOCIAL HISTORY:  He is married.  He is accompanied by his wife.  He is presently not working.  He has 3 children.  Son age 22, has diabetes mellitus and advanced kidney disease.  One daughter was treated with cervical carcinoma and remains in remission, other daughter is in good health.  He smoked cigarettes for 30 years about a pack a day, but quit in 2002.  He generally has 2 drinks of alcohol per week.  PHYSICAL EXAMINATION:  GENERAL:  Weight 264.1 pounds. He is 68 inches tall, pulse 82 per minute, regular; blood pressure 130/80; temp is 97.4; and respirations 20.  HEENT:  Conjunctivae are pink.  Sclera is nonicteric.  Oropharyngeal mucosa is normal.  He has upper lower dentures in place.  NECK:  No thyromegaly or lymphadenopathy noted.  JVD is not elevated.  He has bilateral gynecomastia, but no spider angiomata.  CARDIAC:  Regular rhythm.  Normal S1, S2.  Grade 2-3/6 systolic ejection murmur heard all over the precordium.  LUNGS:  Clear to auscultation.  ABDOMEN:  Protuberant.  Bowel sounds are normal.  On palpation, soft abdomen without tenderness,  organomegaly, or masses.  Shifting dullness noted.  Fluid thrill is absent.  RECTAL:  Deferred.  No peripheral edema, clubbing noted.   The patient is alert and does not have asterixis.   CBC from September 22, 2011, WBC 7.5; H and H 10.2 and 33.2; platelet count 158.  Serum sodium 139, potassium 4.5, chloride 105, CO2 of 22, glucose 101, BUN 16, creatinine 1.13, total bilirubin of 0.6, AP 82, AST 20, ALT 14, total protein 7.5 with albumin of 4.3, calcium 9.4.  ASSESSMENT:  [name] is a 66 year old Caucasian male with multiple medical problems who was recently found to have new onset of ascites and he has undergone abdominal paracentesis and cytology is negative.  Ultrasound of his liver  suggest cirrhotic changes.  He also has splenomegaly.  He has had thrombocytopenia, which would go along with this diagnosis.  Interestingly, serum albumin of 4.3, which was suggested, hepatic function is well preserved; however, there is an issue with portal hypertension.  It is nice to know that recent EGDs were negative for esophageal and/or gastric varices.  His markers for hep B and C are negative.  He does not have any risk factors for liver disease other than obesity and diabetes mellitus.   Whether or not, the patient will need evaluation for liver transplant.  We will depend on his cardiac status and Dr. Andee Lineman may want to repeat an echocardiography.  Clinically, he does not appear to have evidence of hepatic encephalopathy.  He has well preserved renal function, which is reassuring.  RECOMMENDATIONS:  We request consultation with dietitian to help him adhere to low-sodium diet and also get advise on calorie intake, so he can start losing weight.   The patient advised not to eat raw fish.  He will talk with Dr. Lenise Arena about getting vaccination for hepatitis A and B.   We will start him on spironolactone 50 mg p.o. b.i.d.   He will stop potassium chloride on October 14, 2011.   Patient advised to gradually  increase physical activity.  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   He will return for followup visit in 4 weeks from now.  We appreciated the opportunity to [place] in the calves [place] sign  We appreciate the opportunity to participate in the care this gentleman.

## 2011-10-18 ENCOUNTER — Ambulatory Visit: Payer: Managed Care, Other (non HMO) | Admitting: Cardiology

## 2011-10-20 ENCOUNTER — Other Ambulatory Visit (HOSPITAL_BASED_OUTPATIENT_CLINIC_OR_DEPARTMENT_OTHER): Payer: Managed Care, Other (non HMO) | Admitting: Lab

## 2011-10-20 DIAGNOSIS — E785 Hyperlipidemia, unspecified: Secondary | ICD-10-CM

## 2011-10-20 DIAGNOSIS — I509 Heart failure, unspecified: Secondary | ICD-10-CM

## 2011-10-20 DIAGNOSIS — I10 Essential (primary) hypertension: Secondary | ICD-10-CM

## 2011-10-20 DIAGNOSIS — D696 Thrombocytopenia, unspecified: Secondary | ICD-10-CM

## 2011-10-20 DIAGNOSIS — D509 Iron deficiency anemia, unspecified: Secondary | ICD-10-CM

## 2011-10-20 DIAGNOSIS — Q2730 Arteriovenous malformation, site unspecified: Secondary | ICD-10-CM

## 2011-10-20 LAB — BASIC METABOLIC PANEL
Chloride: 102 mEq/L (ref 96–112)
Creat: 0.9 mg/dL (ref 0.50–1.35)
Potassium: 4.2 mEq/L (ref 3.5–5.3)

## 2011-10-20 LAB — CBC WITH DIFFERENTIAL/PLATELET
BASO%: 0.6 % (ref 0.0–2.0)
HCT: 35.7 % — ABNORMAL LOW (ref 38.4–49.9)
LYMPH%: 19.3 % (ref 14.0–49.0)
MCH: 25.8 pg — ABNORMAL LOW (ref 27.2–33.4)
MCHC: 31.9 g/dL — ABNORMAL LOW (ref 32.0–36.0)
MCV: 80.8 fL (ref 79.3–98.0)
MONO%: 6.4 % (ref 0.0–14.0)
NEUT%: 71.1 % (ref 39.0–75.0)
Platelets: 141 10*3/uL (ref 140–400)
RBC: 4.42 10*6/uL (ref 4.20–5.82)
nRBC: 0 % (ref 0–0)

## 2011-11-10 ENCOUNTER — Encounter (HOSPITAL_COMMUNITY): Payer: Self-pay | Admitting: Dietician

## 2011-11-10 NOTE — Progress Notes (Signed)
Sent appointment confirmation letter for 11/15/11 at 8:45 AM to pt home via [place] mail.

## 2011-11-15 ENCOUNTER — Encounter (HOSPITAL_COMMUNITY): Payer: Self-pay | Admitting: Dietician

## 2011-11-15 ENCOUNTER — Encounter (INDEPENDENT_AMBULATORY_CARE_PROVIDER_SITE_OTHER): Payer: Self-pay

## 2011-11-15 ENCOUNTER — Encounter (INDEPENDENT_AMBULATORY_CARE_PROVIDER_SITE_OTHER): Payer: Self-pay | Admitting: Internal Medicine

## 2011-11-15 ENCOUNTER — Ambulatory Visit (INDEPENDENT_AMBULATORY_CARE_PROVIDER_SITE_OTHER): Payer: Managed Care, Other (non HMO) | Admitting: Internal Medicine

## 2011-11-15 DIAGNOSIS — R188 Other ascites: Secondary | ICD-10-CM

## 2011-11-15 DIAGNOSIS — K7460 Unspecified cirrhosis of liver: Secondary | ICD-10-CM

## 2011-11-15 NOTE — Patient Instructions (Signed)
Physician will review your blood work from later this week.

## 2011-11-15 NOTE — Progress Notes (Signed)
Presenting complaint;  Followup for cirrhotic ascites. Initial visit on 10/11/2011.  Subjective:  [name] a 66 year old Caucasian male who is here for scheduled visit accompanied by his wife for reevaluation of her recently diagnosed cirrhosis with ascites. Patient was begun on Spironolactone, continued on furosemide and KCl was discontinued. He was advised to increase physical activity and stay on low-salt diet. He has received first dose of hepatitis A and B vaccine. Even though he has not lost any weight he feels much better. He has increased physical activity. He is also doing some yard work and did not feel tired. He denies increase in abdominal distention or pain. He has scheduled blood work later this week prior to this visit for Dr. Gaylyn Rong. He is also breathing better. He has not experienced any breakthrough symptoms since dropping dose of PPI. He also has taken a few doses of Tylenol since his office visit. He denies melena or rectal bleeding. Current medications; Current Outpatient Prescriptions  Medication Sig Dispense Refill  . Ascorbic Acid (VITAMIN C) 1000 MG tablet Take 1,000 mg by mouth 2 (two) times daily.      [name] aspirin EC 81 MG tablet Take 1 tablet (81 mg total) by mouth daily.      [name] atorvastatin (LIPITOR) 20 MG tablet Take 20 mg by mouth at bedtime.       . digoxin (LANOXIN) 0.25 MG tablet Take 1 tablet (250 mcg total) by mouth daily.  90 tablet  3  . ferrous gluconate (FERGON) 246 (28 FE) MG tablet Take 1 tablet (246 mg total) by mouth 3 (three) times daily with meals.  270 tablet  0  . furosemide (LASIX) 80 MG tablet Take 1 tablet (80 mg total) by mouth daily.  90 tablet  3  . glimepiride (AMARYL) 4 MG tablet Take 4 mg by mouth at bedtime.       . hyoscyamine (LEVSIN, ANASPAZ) 0.125 MG tablet Take 0.125 mg by mouth every 6 (six) hours as needed.       . isosorbide mononitrate (IMDUR) 30 MG 24 hr tablet Take 1 tablet (30 mg total) by mouth daily.  30 tablet  11  . lisinopril  (PRINIVIL,ZESTRIL) 10 MG tablet Take 1 tablet (10 mg total) by mouth daily.  90 tablet  3  . metFORMIN (GLUCOPHAGE) 1000 MG tablet Take 1,000 mg by mouth 2 (two) times daily with a meal.       . metoprolol (LOPRESSOR) 50 MG tablet Take 50 mg by mouth 2 (two) times daily.       . Multiple Vitamin (MULTIVITAMIN) tablet Take 1 tablet by mouth daily.       . nitroGLYCERIN (NITROSTAT) 0.4 MG SL tablet Place 1 tablet (0.4 mg total) under the tongue every 5 (five) minutes as needed for chest pain.  25 tablet  3  . Omega-3 Fatty Acids (FISH OIL) 1200 MG CAPS Take 1 capsule by mouth 2 (two) times daily.       [name] omeprazole (PRILOSEC) 20 MG capsule Take 20 mg by mouth 2 (two) times daily.       . sitaGLIPtan (JANUVIA) 100 MG tablet Take 100 mg by mouth daily.       [name] spironolactone (ALDACTONE) 50 MG tablet Take 1 tablet (50 mg total) by mouth 2 (two) times daily.  60 tablet  11  . verapamil (CALAN) 80 MG tablet TAKE 1 TABLET THREE TIMES A DAY  270 tablet  3  . DISCONTD: digoxin (LANOXIN) 0.125 MG tablet Take  1 tablet (125 mcg total) by mouth daily.  90 tablet  3  . DISCONTD: potassium chloride SA (K-DUR,KLOR-CON) 20 MEQ tablet Take 1 tablet (20 mEq total) by mouth 2 (two) times daily.  180 tablet  3     Current Medications: Current Outpatient Prescriptions  Medication Sig Dispense Refill  . Ascorbic Acid (VITAMIN C) 1000 MG tablet Take 1,000 mg by mouth 2 (two) times daily.      [name] aspirin EC 81 MG tablet Take 1 tablet (81 mg total) by mouth daily.      [name] atorvastatin (LIPITOR) 20 MG tablet Take 20 mg by mouth at bedtime.       . digoxin (LANOXIN) 0.25 MG tablet Take 1 tablet (250 mcg total) by mouth daily.  90 tablet  3  . ferrous gluconate (FERGON) 246 (28 FE) MG tablet Take 1 tablet (246 mg total) by mouth 3 (three) times daily with meals.  270 tablet  0  . furosemide (LASIX) 80 MG tablet Take 1 tablet (80 mg total) by mouth daily.  90 tablet  3  . glimepiride (AMARYL) 4 MG tablet Take 4 mg by mouth  at bedtime.       . hyoscyamine (LEVSIN, ANASPAZ) 0.125 MG tablet Take 0.125 mg by mouth every 6 (six) hours as needed.       . isosorbide mononitrate (IMDUR) 30 MG 24 hr tablet Take 1 tablet (30 mg total) by mouth daily.  30 tablet  11  . lisinopril (PRINIVIL,ZESTRIL) 10 MG tablet Take 1 tablet (10 mg total) by mouth daily.  90 tablet  3  . metFORMIN (GLUCOPHAGE) 1000 MG tablet Take 1,000 mg by mouth 2 (two) times daily with a meal.       . metoprolol (LOPRESSOR) 50 MG tablet Take 50 mg by mouth 2 (two) times daily.       . Multiple Vitamin (MULTIVITAMIN) tablet Take 1 tablet by mouth daily.       . nitroGLYCERIN (NITROSTAT) 0.4 MG SL tablet Place 1 tablet (0.4 mg total) under the tongue every 5 (five) minutes as needed for chest pain.  25 tablet  3  . Omega-3 Fatty Acids (FISH OIL) 1200 MG CAPS Take 1 capsule by mouth 2 (two) times daily.       [name] omeprazole (PRILOSEC) 20 MG capsule Take 20 mg by mouth 2 (two) times daily.       . sitaGLIPtan (JANUVIA) 100 MG tablet Take 100 mg by mouth daily.       [name] spironolactone (ALDACTONE) 50 MG tablet Take 1 tablet (50 mg total) by mouth 2 (two) times daily.  60 tablet  11  . verapamil (CALAN) 80 MG tablet TAKE 1 TABLET THREE TIMES A DAY  270 tablet  3  . DISCONTD: digoxin (LANOXIN) 0.125 MG tablet Take 1 tablet (125 mcg total) by mouth daily.  90 tablet  3  . DISCONTD: potassium chloride SA (K-DUR,KLOR-CON) 20 MEQ tablet Take 1 tablet (20 mEq total) by mouth 2 (two) times daily.  180 tablet  3     Objective: Blood pressure 116/76, pulse 72, temperature 97.8 F (36.6 C), temperature source Oral, resp. rate 20, height 5\' 8"  (1.727 m), weight 265 lb 11.2 oz (120.521 kg). Patient is alert and does not have asterixis. Conjunctiva is pink. Sclera is nonicteric Oropharyngeal mucosa is normal. No neck masses or thyromegaly noted. Cardiac exam with regular rhythm normal S1 and S2. No murmur or gallop noted. Lungs are clear to auscultation. Abdomen  is  protuberant. Shifting dullness is noted. Spleen is not the liver is 4-5 cm below RCM.  No LE edema or clubbing noted.  Labs/studies Results: Blood work from 14-Nov-2011. Serum potassium 4.2 BUN 17 and creatinine 0.9 Alpha-fetoprotein 2.2. HCV antibody nonreactive(09/30/2011).  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 He is tolerating present combination of furosemide and spironolactone and his ascites is not getting worse.    History of iron deficiency anemia with negative workup he is receiving both oral and parenteral iron under supervision by Dr. Gaylyn Rong.   Plan:  Patient will continue current dose of furosemide and spironolactone. Further dose adjustment will be made after review of electrolytes to be done later this week. Patient must gradually increase physical activity and lose weight as this approach will improve hepatic function and slow down progression of his disease. He has to get his BMI below 35 in order to be considered for liver transplant should the need arise down the road. I will review patient's blood work over the phone and plan to see him back in 3 months. Next alpha-fetoprotein and ultrasound would be in six-months

## 2011-11-15 NOTE — Progress Notes (Signed)
Outpatient Initial Nutrition Assessment  Date:11/15/2011   Time: 8:15 AM  Referring Physician: Dr. Karilyn Cota Reason for Visit: obesity, low sodium diet  Pt presented early for 8:45 AM appointment. Pt was seen at 8:15 AM.   Nutrition Assessment:  Height: 5\' 8"  (172.7 cm)   Weight: 264 lb (119.75 kg)   IBW: 154# %IBW: 171% UBW: 264# %UBW: 100% Body mass index is 40.14 kg/(m^2).  Goal Weight: 238# (10% weight loss) Weight hx: Pt reports lowest weight of 123# 46 years ago. His highest weight was 305# around 1 year ago. Pt has steadily lost weight within the past year. She weight 289# in 6/12 (25#, 8.7% loss) x 10 months.   Estimated nutritional needs: 3000-3600 kcals daily, 120-144 grams protein daily, 3.0-3.6 L fluid daily  PMH:  Past Medical History  Diagnosis Date  . Overweight   . Coronary atherosclerosis of artery bypass graft   . Atrial fibrillation     Permanent; off of Coumadin for now due to GI bleed  . Shortness of breath   . Type II or unspecified type diabetes mellitus without mention of complication, not stated as uncontrolled   . OSA (obstructive sleep apnea)   . Insomnia   . Carotid bruit 06/14/2011  . Angina   . Hypertension   . CHF (congestive heart failure)   . GERD (gastroesophageal reflux disease)   . COPD (chronic obstructive pulmonary disease)   . Iron deficiency anemia   . H/O hiatal hernia   . AVM (arteriovenous malformation)   . Hyperlipidemia   . Thrombocytopenia   . Ascites   . Osteoarthritis   . Mediastinal adenopathy 09/22/2011    Medications:  Current Outpatient Rx  Name Route Sig Dispense Refill  . VITAMIN C 1000 MG PO TABS Oral Take 1,000 mg by mouth 2 (two) times daily.    . ASPIRIN EC 81 MG PO TBEC Oral Take 1 tablet (81 mg total) by mouth daily.    . ATORVASTATIN CALCIUM 20 MG PO TABS Oral Take 20 mg by mouth at bedtime.     [name] DIGOXIN 0.25 MG PO TABS Oral Take 1 tablet (250 mcg total) by mouth daily. 90 tablet 3  . FERROUS GLUCONATE IRON  246 (28 FE) MG PO TABS Oral Take 1 tablet (246 mg total) by mouth 3 (three) times daily with meals. 270 tablet 0  . FUROSEMIDE 80 MG PO TABS Oral Take 1 tablet (80 mg total) by mouth daily. 90 tablet 3  . GLIMEPIRIDE 4 MG PO TABS Oral Take 4 mg by mouth at bedtime.     [name] HYOSCYAMINE SULFATE 0.125 MG PO TABS Oral Take 0.125 mg by mouth every 6 (six) hours as needed.     . ISOSORBIDE MONONITRATE ER 30 MG PO TB24 Oral Take 1 tablet (30 mg total) by mouth daily. 30 tablet 11  . LISINOPRIL 10 MG PO TABS Oral Take 1 tablet (10 mg total) by mouth daily. 90 tablet 3  . METFORMIN HCL 1000 MG PO TABS Oral Take 1,000 mg by mouth 2 (two) times daily with a meal.     . METOPROLOL TARTRATE 50 MG PO TABS Oral Take 50 mg by mouth 2 (two) times daily.     [name] ONE-DAILY MULTI VITAMINS PO TABS Oral Take 1 tablet by mouth daily.     [name] NITROGLYCERIN 0.4 MG SL SUBL Sublingual Place 1 tablet (0.4 mg total) under the tongue every 5 (five) minutes as needed for chest pain. 25 tablet 3  .  FISH OIL 1200 MG PO CAPS Oral Take 1 capsule by mouth 2 (two) times daily.     [name] OMEPRAZOLE 20 MG PO CPDR Oral Take 20 mg by mouth 2 (two) times daily.     [name] SITAGLIPTIN PHOSPHATE 100 MG PO TABS Oral Take 100 mg by mouth daily.     [name] SPIRONOLACTONE 50 MG PO TABS Oral Take 1 tablet (50 mg total) by mouth 2 (two) times daily. 60 tablet 11  . VERAPAMIL HCL 80 MG PO TABS  TAKE 1 TABLET THREE TIMES A DAY 270 tablet 3    Labs: CMP     Component Value Date/Time   NA 141 10/11/2011 1131   K 4.2 10/11/2011 1131   CL 102 10/11/2011 1131   CO2 26 10/11/2011 1131   GLUCOSE 110* 10/11/2011 1131   BUN 17 10/11/2011 1131   CREATININE 0.90 10/11/2011 1131   CREATININE 1.13 09/22/2011 0945   CALCIUM 9.5 10/11/2011 1131   PROT 7.5 09/22/2011 0945   ALBUMIN 4.3 09/22/2011 0945   AST 20 09/22/2011 0945   ALT 14 09/22/2011 0945   ALKPHOS 82 09/22/2011 0945   BILITOT 0.6 09/22/2011 0945   GFRNONAA >90 08/28/2011 0623   GFRAA >90 08/28/2011 1610    Lipid Panel    No results found for this basename: chol, trig, hdl, cholhdl, vldl, ldlcalc     Lab Results  Component Value Date   HGBA1C 4.8 05/27/2011   HGBA1C 5.8* 03/31/2011   HGBA1C  Value: 7.7 (NOTE)                                                                       According to the ADA Clinical Practice Recommendations for 2011, when HbA1c is used as a screening test:   >=6.5%   Diagnostic of Diabetes Mellitus           (if abnormal result  is confirmed)  5.7-6.4%   Increased risk of developing Diabetes Mellitus  References:Diagnosis and Classification of Diabetes Mellitus,Diabetes Care,2011,34(Suppl 1):S62-S69 and Standards of Medical Care in         Diabetes - 2011,Diabetes Care,2011,34  (Suppl 1):S11-S61.* 08/07/2010   Lab Results  Component Value Date   CREATININE 0.90 10/11/2011     Lifestyle/ social habits: 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 He has been out of work since September. He lives in [place] with is wife (who is present today) and two children, who are in their 7's and are on disability due to their medical issues.   Nutrition hx/habits: Mr. Cozzens reports no changes in his diet. He does not follow any type of diet. He does not exercise, but reports being more active by weeding, walking and mowing. He reports his body "gives out too quick". He does not do these activities on a regular basis. He eats 3 meals daily and occasionally snacks. He watches TV during dinner; while he is not in the same room as the TV, he has a clear view of it at his spot at the dining room table. He eats out about 10% of the time, mostly at [place] and a local family restaurant. He drinks mostly water and diet [facility]. He has been using vinegar and oil as  salad dressing instead of creamy dressings. Pt wife reports she always purchases fresh fruits and vegetables  Diet recall: Breakfast (10 AM): bowl of cereal (Honey nut cheerios or special K) with 2% milk OR egg sandwich made with whole wheat  bread, eggs, and pepper, diet mountain dew OR water; Lunch (1:00 PM): tomato sandwich (mayo, bread, tomato, lettuce) OR banana sandwich (bread, banana, mayo) OR grilled cheese and canned chicken noodle soup OR homemade chicken salad with 2-3 Saltine crackers, water OR diet [place]; Snack (3:00 PM): bag of microwave popcorn; Dinner (6-6:30 PM): fried chicken, green peas, corn, pinto beans, diet [place]; Snack: 3-4 fresh pineapple chunks  Nutrition Diagnosis: Excessive sodium intake r/t diet high in processed foods AEB diet recall.  Nutrition Intervention: Nutrition rx: 2500 kcal NAS, no added sugar diet; low calorie beverages only; limit fried foods and canned soups; physical activity as tolerated  Education/Counseling Provided: Educated pt on plate method. Discussed importance of incorporating more fruits and vegetables in diet and using them as alternatives to high sodium snacks. Discussed healthy food preparation techniques, such as limiting frying and baking, boiling, broiling, and grilling more often. Discussed choosing leaner cuts of meats. Discussed alternative seasonings rather than salt. Discussed sodium contents of food often chosen. Encouraged dietary compliance to help manage conditions. Provided plate method handout and handout from the Academy of Nutrition and Dietetics re: reduced sodium diet.  Understanding, Motivation, Ability to Follow Recommendations: Expect fair to good compliance.  Monitoring and Evaluation: Goals: 1) 1-2# weight loss per week; 2) less than 2,00 mg sodium daily; 3) Physical activity as tolerated  Recommendations: 1) For weight loss 2000-2500 kcals daily; 2) Limit foods with sauces and gravies and fried foods; 3) Fruits and vegetables as snacks  F/U: PRN. Provided RD contact information.   Orlene Plum, RD  11/15/2011  Time: 8:15 AM

## 2011-11-17 ENCOUNTER — Ambulatory Visit (INDEPENDENT_AMBULATORY_CARE_PROVIDER_SITE_OTHER): Payer: Managed Care, Other (non HMO) | Admitting: Cardiology

## 2011-11-17 ENCOUNTER — Other Ambulatory Visit (HOSPITAL_BASED_OUTPATIENT_CLINIC_OR_DEPARTMENT_OTHER): Payer: Managed Care, Other (non HMO) | Admitting: Lab

## 2011-11-17 ENCOUNTER — Telehealth: Payer: Self-pay | Admitting: *Deleted

## 2011-11-17 ENCOUNTER — Encounter: Payer: Self-pay | Admitting: Cardiology

## 2011-11-17 VITALS — BP 123/68 | HR 65 | Ht 68.0 in | Wt 264.0 lb

## 2011-11-17 DIAGNOSIS — Q2730 Arteriovenous malformation, site unspecified: Secondary | ICD-10-CM

## 2011-11-17 DIAGNOSIS — D696 Thrombocytopenia, unspecified: Secondary | ICD-10-CM

## 2011-11-17 DIAGNOSIS — K7460 Unspecified cirrhosis of liver: Secondary | ICD-10-CM

## 2011-11-17 DIAGNOSIS — R0989 Other specified symptoms and signs involving the circulatory and respiratory systems: Secondary | ICD-10-CM

## 2011-11-17 DIAGNOSIS — D509 Iron deficiency anemia, unspecified: Secondary | ICD-10-CM

## 2011-11-17 DIAGNOSIS — E785 Hyperlipidemia, unspecified: Secondary | ICD-10-CM

## 2011-11-17 DIAGNOSIS — I509 Heart failure, unspecified: Secondary | ICD-10-CM

## 2011-11-17 DIAGNOSIS — I10 Essential (primary) hypertension: Secondary | ICD-10-CM

## 2011-11-17 DIAGNOSIS — R188 Other ascites: Secondary | ICD-10-CM

## 2011-11-17 DIAGNOSIS — I25810 Atherosclerosis of coronary artery bypass graft(s) without angina pectoris: Secondary | ICD-10-CM

## 2011-11-17 DIAGNOSIS — I4891 Unspecified atrial fibrillation: Secondary | ICD-10-CM

## 2011-11-17 DIAGNOSIS — D649 Anemia, unspecified: Secondary | ICD-10-CM

## 2011-11-17 DIAGNOSIS — N62 Hypertrophy of breast: Secondary | ICD-10-CM

## 2011-11-17 DIAGNOSIS — Q279 Congenital malformation of peripheral vascular system, unspecified: Secondary | ICD-10-CM

## 2011-11-17 LAB — CBC WITH DIFFERENTIAL/PLATELET
Eosinophils Absolute: 0.2 10*3/uL (ref 0.0–0.5)
HCT: 36.6 % — ABNORMAL LOW (ref 38.4–49.9)
LYMPH%: 21 % (ref 14.0–49.0)
MONO#: 0.6 10*3/uL (ref 0.1–0.9)
NEUT#: 4.2 10*3/uL (ref 1.5–6.5)
Platelets: 111 10*3/uL — ABNORMAL LOW (ref 140–400)
RBC: 4.39 10*6/uL (ref 4.20–5.82)
WBC: 6.4 10*3/uL (ref 4.0–10.3)
nRBC: 0 % (ref 0–0)

## 2011-11-17 NOTE — Assessment & Plan Note (Signed)
Followup Dopplers will be done next visit

## 2011-11-17 NOTE — Assessment & Plan Note (Addendum)
No evidence of volume overload continue current medical therapy. Ejection fraction 60%

## 2011-11-17 NOTE — Telephone Encounter (Signed)
Message copied by Wende Mott on Thu Nov 17, 2011  9:41 AM ------      Message from: HA, Raliegh Ip T      Created: Thu Nov 17, 2011  8:41 AM       Please call pt.  Please let him know that his Hgb has improved (with recent IV Feraheme).  His plt has slightly decreased but not major (this is due to his cirrhosis).  Continue oral iron if tolerated.  Thanks.

## 2011-11-17 NOTE — Progress Notes (Signed)
Matthew Bottoms, MD, [facility] ABIM Board Certified in Adult Cardiovascular Medicine,Internal Medicine and Critical Care Medicine    CC: Followup patient permanent atrial fibrillation recently diagnosed with ascites and cirrhosis  HPI:  Patient has been seen by gastroenterology and is doing much better after paracentesis in the addition of Lasix and spironolactone to his medical regimen. He has lost 12 pounds. He feels less distended. From a cardiac standpoint is stable. He status post coronary bypass grafting but even in the setting of anemia he has had no recurrent angina. He last catheterization 2012 he had patent grafts. He reports no orthopnea PND. He is in permanent atrial fibrillation but reports no palpitations presyncope or syncope. 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 The patient is also being treated for iron deficiency anemia  PMH: reviewed and listed in Problem List in Electronic Records (and see below) Past Medical History  Diagnosis Date  . Overweight   . Coronary atherosclerosis of artery bypass graft   . Atrial fibrillation     Permanent; off of Coumadin for now due to GI bleed  . Shortness of breath   . Type II or unspecified type diabetes mellitus without mention of complication, not stated as uncontrolled   . OSA (obstructive sleep apnea)   . Insomnia   . Carotid bruit 06/14/2011  . Angina   . Hypertension   . CHF (congestive heart failure)   . GERD (gastroesophageal reflux disease)   . COPD (chronic obstructive pulmonary disease)   . Iron deficiency anemia     Requiring intravenous iron  . H/O hiatal hernia   . AVM (arteriovenous malformation)     Recurrent GI bleeding requiring multiple transfusions  . Hyperlipidemia   . Thrombocytopenia   . Ascites     status post paracentesis with removal of 3.4 L of ascitic fluid  . Osteoarthritis   .  Mediastinal adenopathy 09/22/2011  . Cirrhosis    Past Surgical History  Procedure Date  . Coronary artery bypass graft  10/15/2002     [facility] Decent. Dorris Fetch, M.D.     . Carpal tunnel release   10/08/2003  . Lipoma surgery   . Hernia repair   . Other surgical history 08/26/2011    [facility],  enteroscopy , revealing "three-four AVMs."     Allergies/SH/FHX : available in Electronic Records for review  Allergies  Allergen Reactions  . Codeine Other (See Comments)    Hurting in chest  . Diltiazem Hcl Itching  . Niacin Other (See Comments)    Hot flashes   History   Social History  . Marital Status: Married    Spouse Name: N/A    Number of Children: 3  . Years of Education: N/A   Occupational History  .      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 Smoking status: Former Smoker -- 1.0 packs/day for 30 years    Types: Cigarettes    Quit date: 07/25/2000  . Smokeless tobacco: Never Used  . Alcohol Use: 0.6 oz/week    1 Shots of liquor per week     Occasionally  . Drug Use: No  . Sexually Active: Yes   Other Topics Concern  . Not on file   Social History Narrative   Lives in [place], [place] with wife.    Family History  Problem Relation Age of  Onset  . Coronary artery disease      FAMILY HISTORY  . Hypertension Father   . Diabetes Father   . Heart disease Father   . COPD Sister   . Cancer Maternal Aunt     Breast cancer   . Cancer Maternal Aunt     Breast cancer   . Diabetes Son   . Cancer Daughter     Cervical cancer    Medications: Current Outpatient Prescriptions  Medication Sig Dispense Refill  . Ascorbic Acid (VITAMIN C) 1000 MG tablet Take 1,000 mg by mouth 2 (two) times daily.      [name] aspirin EC 81 MG tablet Take 1 tablet (81 mg total) by mouth daily.      [name] atorvastatin (LIPITOR) 20 MG tablet Take 20 mg by mouth at bedtime.       . digoxin (LANOXIN) 0.25 MG tablet Take 1 tablet (250 mcg total) by mouth daily.  90 tablet  3  .  ferrous gluconate (FERGON) 246 (28 FE) MG tablet Take 1 tablet (246 mg total) by mouth 3 (three) times daily with meals.  270 tablet  0  . furosemide (LASIX) 80 MG tablet Take 1 tablet (80 mg total) by mouth daily.  90 tablet  3  . glimepiride (AMARYL) 4 MG tablet Take 4 mg by mouth at bedtime.       . hyoscyamine (LEVSIN, ANASPAZ) 0.125 MG tablet Take 0.125 mg by mouth every 6 (six) hours as needed.       . isosorbide mononitrate (IMDUR) 30 MG 24 hr tablet Take 1 tablet (30 mg total) by mouth daily.  30 tablet  11  . lisinopril (PRINIVIL,ZESTRIL) 10 MG tablet Take 1 tablet (10 mg total) by mouth daily.  90 tablet  3  . metFORMIN (GLUCOPHAGE) 1000 MG tablet Take 1,000 mg by mouth 2 (two) times daily with a meal.       . metoprolol (LOPRESSOR) 50 MG tablet Take 50 mg by mouth 2 (two) times daily.       . Multiple Vitamin (MULTIVITAMIN) tablet Take 1 tablet by mouth daily.       . nitroGLYCERIN (NITROSTAT) 0.4 MG SL tablet Place 1 tablet (0.4 mg total) under the tongue every 5 (five) minutes as needed for chest pain.  25 tablet  3  . Omega-3 Fatty Acids (FISH OIL) 1200 MG CAPS Take 1 capsule by mouth 2 (two) times daily.       [name] omeprazole (PRILOSEC) 20 MG capsule Take 20 mg by mouth 2 (two) times daily.       . sitaGLIPtan (JANUVIA) 100 MG tablet Take 100 mg by mouth daily.       [name] spironolactone (ALDACTONE) 50 MG tablet Take 1 tablet (50 mg total) by mouth 2 (two) times daily.  60 tablet  11  . verapamil (CALAN) 80 MG tablet TAKE 1 TABLET THREE TIMES A DAY  270 tablet  3  . DISCONTD: digoxin (LANOXIN) 0.125 MG tablet Take 1 tablet (125 mcg total) by mouth daily.  90 tablet  3  . DISCONTD: potassium chloride SA (K-DUR,KLOR-CON) 20 MEQ tablet Take 1 tablet (20 mEq total) by mouth 2 (two) times daily.  180 tablet  3    ROS: No nausea or vomiting. No fever or chills.No melena or hematochezia.No bleeding.No claudication  Physical Exam: BP 123/68  Pulse 65  Ht 5\' 8"  (1.727 m)  Wt 264 lb (119.75  kg)  BMI 40.14 kg/m2 General: Well-nourished white male  less edematous than during the last exam Neck: Normal carotid upstroke with soft carotid bruits. No thyromegaly nonnodular thyroid Lungs: Clear breath sounds bilaterally. No wheezing Cardiac: Irregular rate and rhythm with normal S1-S2 no pathological murmurs Vascular: No edema. Normal distal pulses Skin: Warm and dry Physcologic: Normal affect  12lead ECG: Single lead tracing demonstrates rate controlled atrial fibrillation Limited bedside ECHO:N/A No images are attached to the encounter.   Assessment and Plan  CHF (congestive heart failure) No evidence of volume overload continue current medical therapy. Ejection fraction 60%  CAD, ARTERY BYPASS GRAFT Status post catheterization in July 2012 continued patency of grafts. Normal LV function.  Atrial fibrillation Patient has permanent atrial fibrillation but is asymptomatic. Rhythm check today in the office revealed good rate control with a heart rate of 70-80 beats per minute  AVM (arteriovenous malformation) Patient is not a Coumadin candidate anymore due to recurrent GI bleeding with multiple transfusions over the last year. Patient also also iron deficiency which is treated with intravenous iron.  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    Patient Active Problem List  Diagnoses  . HYPERLIPIDEMIA-MIXED  . OVERWEIGHT/OBESITY  . ANEMIA  . CAD, ARTERY BYPASS GRAFT  . Atrial fibrillation  . DYSPNEA  . Carotid bruit  . Chest pain  . Small bowel arteriovenous malformation  . Iron deficiency anemia  . AVM (arteriovenous malformation)  . Hyperlipidemia  . Hypertension  . CHF  (congestive heart failure)  . Gynecomastia  . Ascites  . Thrombocytopenia  . Osteoarthritis  . Mediastinal adenopathy  . Cirrhosis

## 2011-11-17 NOTE — Assessment & Plan Note (Signed)
Stable blood work this morning showed a hemoglobin of 11.8.

## 2011-11-17 NOTE — Assessment & Plan Note (Signed)
Patient has permanent atrial fibrillation but is asymptomatic. Rhythm check today in the office revealed good rate control with a heart rate of 70-80 beats per minute

## 2011-11-17 NOTE — Telephone Encounter (Signed)
Called pt and informed Hgb has improved per Dr. Gaylyn Rong w/ recent IV Stark Bray.  Plts slightly decreased d/t liver disease.  Instructed to keep next lab appt as scheduled in May and call for any problems prior.  Pt verbalized understanding.

## 2011-11-17 NOTE — Patient Instructions (Signed)
Continue all current medications. Your physician wants you to follow up in: 6 months.  You will receive a reminder letter in the mail one-two months in advance.  If you don't receive a letter, please call our office to schedule the follow up appointment   

## 2011-11-17 NOTE — Assessment & Plan Note (Signed)
Status post catheterization in July 2012 continued patency of grafts. Normal LV function.

## 2011-11-17 NOTE — Assessment & Plan Note (Signed)
Patient is not a Coumadin candidate anymore due to recurrent GI bleeding with multiple transfusions over the last year. Patient also also iron deficiency which is treated with intravenous iron.

## 2011-11-17 NOTE — Assessment & Plan Note (Signed)
Much improved and now followed by gastroenterology patient is on spironolactone and Lasix

## 2011-11-17 NOTE — Assessment & Plan Note (Signed)
Secondary to cirrhosis

## 2011-11-17 NOTE — Assessment & Plan Note (Signed)
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

## 2011-12-15 ENCOUNTER — Telehealth: Payer: Self-pay | Admitting: *Deleted

## 2011-12-15 ENCOUNTER — Other Ambulatory Visit (HOSPITAL_BASED_OUTPATIENT_CLINIC_OR_DEPARTMENT_OTHER): Payer: Managed Care, Other (non HMO) | Admitting: Lab

## 2011-12-15 DIAGNOSIS — Q2730 Arteriovenous malformation, site unspecified: Secondary | ICD-10-CM

## 2011-12-15 DIAGNOSIS — I10 Essential (primary) hypertension: Secondary | ICD-10-CM

## 2011-12-15 DIAGNOSIS — I509 Heart failure, unspecified: Secondary | ICD-10-CM

## 2011-12-15 DIAGNOSIS — D696 Thrombocytopenia, unspecified: Secondary | ICD-10-CM

## 2011-12-15 DIAGNOSIS — E785 Hyperlipidemia, unspecified: Secondary | ICD-10-CM

## 2011-12-15 DIAGNOSIS — D509 Iron deficiency anemia, unspecified: Secondary | ICD-10-CM

## 2011-12-15 LAB — CBC WITH DIFFERENTIAL/PLATELET
BASO%: 1.1 % (ref 0.0–2.0)
EOS%: 2.8 % (ref 0.0–7.0)
LYMPH%: 16.2 % (ref 14.0–49.0)
MCH: 27.9 pg (ref 27.2–33.4)
MCHC: 32.8 g/dL (ref 32.0–36.0)
MONO#: 0.5 10*3/uL (ref 0.1–0.9)
Platelets: 112 10*3/uL — ABNORMAL LOW (ref 140–400)
RBC: 4.23 10*6/uL (ref 4.20–5.82)
WBC: 6.2 10*3/uL (ref 4.0–10.3)
nRBC: 0 % (ref 0–0)

## 2011-12-15 NOTE — Telephone Encounter (Signed)
Called pt and informed of Hgb stable per Dr. Gaylyn Rong and to continue to take oral iron.  Keep lab appt in one month as scheduled.  Pt verbalized understanding.

## 2011-12-15 NOTE — Telephone Encounter (Signed)
Message copied by Wende Mott on Thu Dec 15, 2011  1:30 PM ------      Message from: HA, Raliegh Ip T      Created: Thu Dec 15, 2011 10:55 AM       Please call pt.  His Hgb is stable.  Encourage him to take oral iron as tolerated.  Thanks.

## 2011-12-29 ENCOUNTER — Other Ambulatory Visit: Payer: Self-pay | Admitting: *Deleted

## 2011-12-29 MED ORDER — METOPROLOL TARTRATE 50 MG PO TABS: 50.0000 mg | ORAL_TABLET | Freq: Two times a day (BID) | ORAL | Status: DC

## 2012-01-17 ENCOUNTER — Other Ambulatory Visit: Payer: Self-pay | Admitting: Cardiology

## 2012-01-17 MED ORDER — LISINOPRIL 10 MG PO TABS: 10.0000 mg | ORAL_TABLET | Freq: Every day | ORAL | Status: DC

## 2012-01-19 ENCOUNTER — Telehealth: Payer: Self-pay | Admitting: Oncology

## 2012-01-19 ENCOUNTER — Other Ambulatory Visit: Payer: Managed Care, Other (non HMO) | Admitting: Lab

## 2012-01-19 DIAGNOSIS — I509 Heart failure, unspecified: Secondary | ICD-10-CM

## 2012-01-19 DIAGNOSIS — D509 Iron deficiency anemia, unspecified: Secondary | ICD-10-CM

## 2012-01-19 DIAGNOSIS — D696 Thrombocytopenia, unspecified: Secondary | ICD-10-CM

## 2012-01-19 DIAGNOSIS — E785 Hyperlipidemia, unspecified: Secondary | ICD-10-CM

## 2012-01-19 DIAGNOSIS — I10 Essential (primary) hypertension: Secondary | ICD-10-CM

## 2012-01-19 DIAGNOSIS — Q2730 Arteriovenous malformation, site unspecified: Secondary | ICD-10-CM

## 2012-01-19 LAB — CBC WITH DIFFERENTIAL/PLATELET
BASO%: 1.3 % (ref 0.0–2.0)
Basophils Absolute: 0.1 10*3/uL (ref 0.0–0.1)
EOS%: 2.7 % (ref 0.0–7.0)
HGB: 11.9 g/dL — ABNORMAL LOW (ref 13.0–17.1)
MCH: 28.5 pg (ref 27.2–33.4)
MCHC: 32.9 g/dL (ref 32.0–36.0)
MONO%: 9.5 % (ref 0.0–14.0)
RBC: 4.16 10*6/uL — ABNORMAL LOW (ref 4.20–5.82)
RDW: 16.3 % — ABNORMAL HIGH (ref 11.0–14.6)
lymph#: 1.1 10*3/uL (ref 0.9–3.3)

## 2012-01-19 NOTE — Telephone Encounter (Addendum)
Called patient and notified him of labs per Dr Gaylyn Rong. States he is seeing a liver specialist in [place] for his cirrhosis.  Message copied by Myrtis Ser on Thu Jan 19, 2012  9:46 AM ------      Message from: HA, Raliegh Ip T      Created: Thu Jan 19, 2012  8:17 AM       Please call pt.  His Hgb is stable after IV iron in 08/2011.  Please advise him to continue taking oral iron along with VitC as tolerated.            He still has thrombocytopenia.  This is most likely due to his cirrhosis. Nothing to do from this standpoint.  He needs to follow up with his PCP to see if he needs to see a liver doctor for his cirrhosis.        Thanks.

## 2012-01-25 ENCOUNTER — Other Ambulatory Visit: Payer: Self-pay | Admitting: Cardiology

## 2012-02-13 ENCOUNTER — Encounter (INDEPENDENT_AMBULATORY_CARE_PROVIDER_SITE_OTHER): Payer: Self-pay | Admitting: Internal Medicine

## 2012-02-13 ENCOUNTER — Ambulatory Visit (INDEPENDENT_AMBULATORY_CARE_PROVIDER_SITE_OTHER): Payer: Managed Care, Other (non HMO) | Admitting: Internal Medicine

## 2012-02-13 VITALS — BP 116/70 | HR 70 | Temp 97.8°F | Resp 20 | Ht 68.0 in | Wt 263.7 lb

## 2012-02-13 DIAGNOSIS — T50904A Poisoning by unspecified drugs, medicaments and biological substances, undetermined, initial encounter: Secondary | ICD-10-CM

## 2012-02-13 DIAGNOSIS — N62 Hypertrophy of breast: Secondary | ICD-10-CM

## 2012-02-13 DIAGNOSIS — R188 Other ascites: Secondary | ICD-10-CM

## 2012-02-13 DIAGNOSIS — Z8639 Personal history of other endocrine, nutritional and metabolic disease: Secondary | ICD-10-CM

## 2012-02-13 DIAGNOSIS — Z862 Personal history of diseases of the blood and blood-forming organs and certain disorders involving the immune mechanism: Secondary | ICD-10-CM

## 2012-02-13 DIAGNOSIS — Z5181 Encounter for therapeutic drug level monitoring: Secondary | ICD-10-CM

## 2012-02-13 MED ORDER — POTASSIUM CHLORIDE ER 10 MEQ PO TBCR: 20.0000 meq | EXTENDED_RELEASE_TABLET | Freq: Every day | ORAL | Status: DC

## 2012-02-13 NOTE — Progress Notes (Signed)
Presenting complaint;  Followup for cirrhotic ascites. Patient complains of painful breasts.  Subjective:  [name] is 66 year old Caucasian male who is here for three-month scheduled visit. He is accompanied by his wife. He complains of painful breasts. He was first noted about 2 months ago. No discharge noted from the nipples. He remains with good appetite. His bowels move daily. He denies melena or rectal bleeding. Stools been dark at times because he takes iron pills daily. He stays busy. He does yard work twice a week. He only walks once a week. His weight is down by pound and a half since his last visit. His peak weight was 310 pounds while back.  Current Medications: Current Outpatient Prescriptions  Medication Sig Dispense Refill  . Ascorbic Acid (VITAMIN C) 1000 MG tablet Take 1,000 mg by mouth 2 (two) times daily.      [name] aspirin EC 81 MG tablet Take 1 tablet (81 mg total) by mouth daily.      [name] atorvastatin (LIPITOR) 20 MG tablet Take 20 mg by mouth at bedtime.       . digoxin (LANOXIN) 0.25 MG tablet Take 1 tablet (250 mcg total) by mouth daily.  90 tablet  3  . ferrous gluconate (FERGON) 246 (28 FE) MG tablet Take 1 tablet (246 mg total) by mouth 3 (three) times daily with meals.  270 tablet  0  . furosemide (LASIX) 80 MG tablet TAKE 1 TABLET BY MOUTH EVERY DAY  90 tablet  3  . glimepiride (AMARYL) 4 MG tablet Take 4 mg by mouth at bedtime.       . hyoscyamine (LEVSIN, ANASPAZ) 0.125 MG tablet Take 0.125 mg by mouth every 6 (six) hours as needed.       . isosorbide mononitrate (IMDUR) 30 MG 24 hr tablet Take 1 tablet (30 mg total) by mouth daily.  30 tablet  11  . lisinopril (PRINIVIL,ZESTRIL) 10 MG tablet Take 1 tablet (10 mg total) by mouth daily.  90 tablet  3  . metFORMIN (GLUCOPHAGE) 1000 MG tablet Take 1,000 mg by mouth 2 (two) times daily with a meal.       . metoprolol (LOPRESSOR) 50 MG tablet Take 1 tablet (50 mg total) by mouth 2 (two) times daily.  270 tablet  3  .  Multiple Vitamin (MULTIVITAMIN) tablet Take 1 tablet by mouth daily.       . Omega-3 Fatty Acids (FISH OIL) 1200 MG CAPS Take 1 capsule by mouth 2 (two) times daily.       [name] omeprazole (PRILOSEC) 20 MG capsule Take 20 mg by mouth 2 (two) times daily.       . sitaGLIPtan (JANUVIA) 100 MG tablet Take 100 mg by mouth daily.       [name] spironolactone (ALDACTONE) 50 MG tablet Take 1 tablet (50 mg total) by mouth 2 (two) times daily.  60 tablet  11  . verapamil (CALAN) 80 MG tablet TAKE 1 TABLET THREE TIMES A DAY  270 tablet  3  . nitroGLYCERIN (NITROSTAT) 0.4 MG SL tablet Place 1 tablet (0.4 mg total) under the tongue every 5 (five) minutes as needed for chest pain.  25 tablet  3  . DISCONTD: potassium chloride SA (K-DUR,KLOR-CON) 20 MEQ tablet Take 1 tablet (20 mEq total) by mouth 2 (two) times daily.  180 tablet  3     Objective: Blood pressure 116/70, pulse 70, temperature 97.8 F (36.6 C), temperature source Oral, resp. rate 20, height 5\' 8"  (1.727 m),  weight 263 lb 11.2 oz (119.614 kg). Patient is alert and does not have asterixis. Conjunctiva is pink. Sclera is nonicteric Oropharyngeal mucosa is normal. No neck masses or thyromegaly noted. Cardiac exam with irregular rhythm normal S1 and S2. No murmur or gallop noted. Lungs are clear to auscultation. Abdomen is protuberant. It is soft and nontender. No hepatosplenomegaly noted. Shifting dullness absent. No LE edema or clubbing noted.  Labs/studies Results: CBC from 01/19/2012. WBC 5.2, hemoglobin 11.9, hematocrit 36.1, platelet count 108K.  Assessment:  #1. Cirrhotic ascites. He is doing well with diuretic therapy but since he has developed painful gynecomastia spirally lactone will have to be discontinued and he will resume KCl. #2. Painful gynecomastia most likely secondary to spironolactone. #3. Obesity. He needs to be more physically active and needs to lose some weight. #4. History of iron deficiency anemia and GI bleed. H&H stable  since he has been off warfarin.   Plan:  Discontinue spironolactone. KCl 20 mg by mouth daily starting on 02/15/2012. Metabolic 7 today. Patient advised to increase physical activity he should consider walking at least 5 times a week. Office visit in 3 months.

## 2012-02-13 NOTE — Patient Instructions (Addendum)
Discontinue Spironolactone. Start KCl 20 mEq daily. First dose on 02/15/2012. Physician will contact you with results of blood work. You must walk and exercise daily

## 2012-02-14 LAB — BASIC METABOLIC PANEL
Chloride: 97 mEq/L (ref 96–112)
Potassium: 5.1 mEq/L (ref 3.5–5.3)
Sodium: 134 mEq/L — ABNORMAL LOW (ref 135–145)

## 2012-02-16 ENCOUNTER — Other Ambulatory Visit: Payer: Managed Care, Other (non HMO) | Admitting: Lab

## 2012-02-20 ENCOUNTER — Encounter: Payer: Self-pay | Admitting: Oncology

## 2012-02-20 ENCOUNTER — Telehealth: Payer: Self-pay | Admitting: Oncology

## 2012-02-20 ENCOUNTER — Other Ambulatory Visit (HOSPITAL_BASED_OUTPATIENT_CLINIC_OR_DEPARTMENT_OTHER): Payer: Managed Care, Other (non HMO) | Admitting: Lab

## 2012-02-20 ENCOUNTER — Ambulatory Visit (HOSPITAL_BASED_OUTPATIENT_CLINIC_OR_DEPARTMENT_OTHER): Payer: Managed Care, Other (non HMO) | Admitting: Oncology

## 2012-02-20 VITALS — BP 119/63 | HR 76 | Temp 97.4°F | Ht 68.0 in | Wt 268.2 lb

## 2012-02-20 DIAGNOSIS — D696 Thrombocytopenia, unspecified: Secondary | ICD-10-CM

## 2012-02-20 DIAGNOSIS — Q2730 Arteriovenous malformation, site unspecified: Secondary | ICD-10-CM

## 2012-02-20 DIAGNOSIS — D509 Iron deficiency anemia, unspecified: Secondary | ICD-10-CM

## 2012-02-20 DIAGNOSIS — I509 Heart failure, unspecified: Secondary | ICD-10-CM

## 2012-02-20 DIAGNOSIS — E785 Hyperlipidemia, unspecified: Secondary | ICD-10-CM

## 2012-02-20 DIAGNOSIS — D649 Anemia, unspecified: Secondary | ICD-10-CM

## 2012-02-20 DIAGNOSIS — R599 Enlarged lymph nodes, unspecified: Secondary | ICD-10-CM

## 2012-02-20 DIAGNOSIS — R590 Localized enlarged lymph nodes: Secondary | ICD-10-CM

## 2012-02-20 DIAGNOSIS — I4891 Unspecified atrial fibrillation: Secondary | ICD-10-CM

## 2012-02-20 DIAGNOSIS — I10 Essential (primary) hypertension: Secondary | ICD-10-CM

## 2012-02-20 LAB — COMPREHENSIVE METABOLIC PANEL
Albumin: 4.2 g/dL (ref 3.5–5.2)
Alkaline Phosphatase: 69 U/L (ref 39–117)
BUN: 20 mg/dL (ref 6–23)
CO2: 26 mEq/L (ref 19–32)
Glucose, Bld: 321 mg/dL — ABNORMAL HIGH (ref 70–99)
Sodium: 135 mEq/L (ref 135–145)
Total Bilirubin: 0.5 mg/dL (ref 0.3–1.2)
Total Protein: 7 g/dL (ref 6.0–8.3)

## 2012-02-20 LAB — IRON AND TIBC
Iron: 91 ug/dL (ref 42–165)
TIBC: 455 ug/dL — ABNORMAL HIGH (ref 215–435)
UIBC: 364 ug/dL (ref 125–400)

## 2012-02-20 LAB — CBC WITH DIFFERENTIAL/PLATELET
Basophils Absolute: 0.1 10*3/uL (ref 0.0–0.1)
Eosinophils Absolute: 0.1 10*3/uL (ref 0.0–0.5)
HCT: 37.5 % — ABNORMAL LOW (ref 38.4–49.9)
HGB: 12.2 g/dL — ABNORMAL LOW (ref 13.0–17.1)
LYMPH%: 25.5 % (ref 14.0–49.0)
MCV: 87.3 fL (ref 79.3–98.0)
MONO#: 0.4 10*3/uL (ref 0.1–0.9)
MONO%: 8.6 % (ref 0.0–14.0)
NEUT#: 3.2 10*3/uL (ref 1.5–6.5)
Platelets: 108 10*3/uL — ABNORMAL LOW (ref 140–400)
WBC: 5.1 10*3/uL (ref 4.0–10.3)

## 2012-02-20 LAB — FERRITIN: Ferritin: 29 ng/mL (ref 22–322)

## 2012-02-20 NOTE — Progress Notes (Signed)
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   CURRENT THERAPY: oral and IV iron.   INTERVAL HISTORY: [name] 66 y.o. male returns for regular follow up with his wife.  He reports with his increasing Hgb from oral and IV iron, his strength and stamina have improved.  He still have fatigue, SOB, and DOE on exertion but better than last year.  He has chronic bone pain in many joints which are stable.   Patient denies fever, anorexia, weight loss, fatigue, headache, visual changes, confusion, drenching night sweats, palpable lymph node swelling, mucositis, odynophagia, dysphagia, nausea vomiting, jaundice, chest pain, palpitation, productive cough, gum bleeding, epistaxis, hematemesis, hemoptysis, abdominal pain, abdominal swelling, early satiety, melena, hematochezia, hematuria, skin rash, spontaneous bleeding, heat or cold intolerance, bowel bladder incontinence, back pain, focal motor weakness, paresthesia, depression, suicidal or homocidal ideation, feeling hopelessness.   Past Medical History  Diagnosis Date  . Overweight   . Coronary atherosclerosis of artery bypass graft   . Atrial fibrillation     Permanent; off of Coumadin for now due to GI bleed  . Shortness of breath   . Type II or unspecified type diabetes mellitus without mention of complication, not stated as uncontrolled   . OSA (obstructive sleep apnea)   . Insomnia   . Carotid bruit 06/14/2011  . Angina   . Hypertension   . CHF (congestive heart failure)   . GERD (gastroesophageal reflux disease)   . COPD (chronic obstructive pulmonary disease)   . Iron deficiency anemia     Requiring intravenous iron  . H/O hiatal hernia   . AVM (arteriovenous malformation)     Recurrent GI bleeding requiring multiple transfusions  . Hyperlipidemia   .  Thrombocytopenia   . Ascites     status post paracentesis with removal of 3.4 L of ascitic fluid  . Osteoarthritis   . Mediastinal adenopathy 09/22/2011  . Cirrhosis   . Iron deficiency anemia     Past Surgical History  Procedure Date  . Coronary artery bypass graft  10/15/2002     [facility] Decent. Dorris Fetch, M.D.     . Carpal tunnel release   10/08/2003  . Lipoma surgery   . Hernia repair   . Other surgical history 08/26/2011    [facility],  enteroscopy , revealing "three-four AVMs."     Current Outpatient Prescriptions  Medication Sig Dispense Refill  . Ascorbic Acid (VITAMIN C) 1000 MG tablet Take 1,000 mg by mouth 2 (two) times daily.      [name] aspirin EC 81 MG tablet Take 1 tablet (81 mg total) by mouth daily.      [name] atorvastatin (LIPITOR) 20 MG tablet Take 20 mg by mouth at bedtime.       . digoxin (LANOXIN) 0.25 MG tablet Take 1 tablet (250 mcg total) by mouth daily.  90 tablet  3  . ferrous gluconate (FERGON) 246 (28 FE) MG tablet Take 1 tablet (246 mg total) by mouth 3 (three) times daily with meals.  270 tablet  0  . furosemide (LASIX) 80 MG tablet TAKE 1 TABLET BY MOUTH EVERY DAY  90 tablet  3  . glimepiride (AMARYL) 4 MG tablet Take 4 mg by mouth at bedtime.       . isosorbide mononitrate (IMDUR) 30 MG 24 hr tablet Take 1 tablet (30 mg  total) by mouth daily.  30 tablet  11  . lisinopril (PRINIVIL,ZESTRIL) 10 MG tablet Take 1 tablet (10 mg total) by mouth daily.  90 tablet  3  . metFORMIN (GLUCOPHAGE) 1000 MG tablet Take 1,000 mg by mouth 2 (two) times daily with a meal.       . metoprolol (LOPRESSOR) 50 MG tablet Take 1 tablet (50 mg total) by mouth 2 (two) times daily.  270 tablet  3  . Multiple Vitamin (MULTIVITAMIN) tablet Take 1 tablet by mouth daily.       . Omega-3 Fatty Acids (FISH OIL) 1200 MG CAPS Take 1 capsule by mouth 2 (two) times daily.       [name] omeprazole (PRILOSEC) 20 MG capsule Take 20 mg by mouth 2 (two) times daily.       . potassium chloride (K-DUR) 10 MEQ tablet  Take 2 tablets (20 mEq total) by mouth daily.  30 tablet  2  . sitaGLIPtan (JANUVIA) 100 MG tablet Take 100 mg by mouth daily.       . verapamil (CALAN) 80 MG tablet TAKE 1 TABLET THREE TIMES A DAY  270 tablet  3  . nitroGLYCERIN (NITROSTAT) 0.4 MG SL tablet Place 1 tablet (0.4 mg total) under the tongue every 5 (five) minutes as needed for chest pain.  25 tablet  3  . DISCONTD: potassium chloride SA (K-DUR,KLOR-CON) 20 MEQ tablet Take 1 tablet (20 mEq total) by mouth 2 (two) times daily.  180 tablet  3    ALLERGIES:  is allergic to codeine; diltiazem hcl; and niacin.  REVIEW OF SYSTEMS:  The rest of the 14-point review of system was negative.   Filed Vitals:   02/20/12 0832  BP: 119/63  Pulse: 76  Temp: 97.4 F (36.3 C)   Wt Readings from Last 3 Encounters:  02/20/12 268 lb 3.2 oz (121.655 kg)  02/13/12 263 lb 11.2 oz (119.614 kg)  11/17/11 264 lb (119.75 kg)   ECOG Performance status: 1-2  PHYSICAL EXAMINATION:   General:  Obese man, in no acute distress.  Eyes:  no scleral icterus.  ENT:  There were no oropharyngeal lesions.  Neck was without thyromegaly.  Lymphatics:  Negative cervical, supraclavicular or axillary adenopathy.  Respiratory: lungs were clear bilaterally without wheezing or crackles.  Cardiovascular:  Irregularly irregular, S1/S2, without murmur, rub or gallop.  There was no pedal edema.  GI:  abdomen was soft, flat, nontender, nondistended, without organomegaly.  Muscoloskeletal:  no spinal tenderness of palpation of vertebral spine.  Skin exam was without echymosis, petichae.  Neuro exam was nonfocal.  Patient was able to get on and off exam table without assistance.  Gait was normal.  Patient was alerted and oriented.  Attention was good.   Language was appropriate.  Mood was normal without depression.  Speech was not pressured.  Thought content was not tangential.     LABORATORY/RADIOLOGY DATA:  Lab Results  Component Value Date   WBC 5.1 02/20/2012   HGB 12.2*  02/20/2012   HCT 37.5* 02/20/2012   PLT 108* 02/20/2012   GLUCOSE 335* 02/13/2012   ALKPHOS 82 09/22/2011   ALT 14 09/22/2011   AST 20 09/22/2011   NA 134* 02/13/2012   K 5.1 02/13/2012   CL 97 02/13/2012   CREATININE 0.96 02/13/2012   BUN 22 02/13/2012   CO2 26 02/13/2012   INR 1.58* 05/30/2011   HGBA1C 4.8 05/27/2011    ASSESSMENT AND PLAN:  1. HTN: under good control with  Verapamil, Lisininopril, Metoprolol, and Lasix per PCP.  2. DM, type II: Under good control with Glimepiride; Metformin and Januvia per PCP.  3. HLP: He is on Lipitor per PCP.  4. Afib: He is rate controlled on metoprolol and verapamil. His [place] score is high making him at high risk of embolic event. His Coumadin has been on hold since 05/2011 due to GI bleed.  He has been on iron with improvement of his Hgb.  He does not brisk GI bleed.  In my opinion, his risk of embolic event from afib is higher than the risk of a brisk GI bleed.  I advised him to talk with both is cardiologist Dr. Earnestine Leys and his new GI physician Dr. Karilyn Cota to decide whether they agree with me to resume Coumadin.  If he resumes Coumadin, his local PCP can monitor like before.  5. CAD: He is in ASA, Imdur, Lisinopril, metoprolol, Lipitor.  6. CHF: He is on Lisinopril, metoprolol, Lasix, Digoxin.  7. AVM: Extensive work up by GI.  8. Chronic microcytic anemia of iron deficiency:  Continue with oral iron as he is tolerating well.  We may consider repeat IV iron in the future if he has severe iron deficiency.  9. 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  There is no active bleeding.  There is no indication for transfusion.  10. Mediastinal adenopathy: Most likely reactive. They are still <2cm. Compared to 2007 and 2008, there was only slight increase. I will repeat CT chest in around August 2013 to document stability.  I will order this to be performed in the next few weeks.  11. Ascites: due to cirrhosis.  Management per GI.  12. Follow up:  Lab only appointment in about 3 months.  Return visit in about 6 months.      The length of time of the face-to-face encounter was 15 minutes. More than 50% of time was spent counseling and coordination of care.

## 2012-02-20 NOTE — Patient Instructions (Addendum)
1.  Diagnosis:  Anemia of iron deficiency (most likely from poor absorption) and thrombocytopenia (low platelet count from cirrhosis).  Your anemia has improved with iron repletion.  Your platelet counts are stable.  2.  Treatment:  Oral iron ferrous gluconate three times daily.   Control diabetes and complete abstention from alcohol to prevent worsening of cirrhosis and platelet count. 3.  Follow up:  Lab in about 3 months; return visit in about 6 months.

## 2012-02-20 NOTE — Telephone Encounter (Signed)
appts made and printed for  Pt aom °

## 2012-02-27 ENCOUNTER — Telehealth: Payer: Self-pay | Admitting: Oncology

## 2012-02-27 NOTE — Telephone Encounter (Signed)
S/w pt today re ct @ [facility] @ [address]. Also confirmed 05/22/12 and 08/21/12.

## 2012-03-01 ENCOUNTER — Ambulatory Visit
Admission: RE | Admit: 2012-03-01 | Discharge: 2012-03-01 | Disposition: A | Discharge status: Home / Self Care | Payer: Managed Care, Other (non HMO) | Source: Ambulatory Visit | Attending: Oncology | Admitting: Oncology

## 2012-03-01 DIAGNOSIS — D649 Anemia, unspecified: Secondary | ICD-10-CM

## 2012-03-01 DIAGNOSIS — D696 Thrombocytopenia, unspecified: Secondary | ICD-10-CM

## 2012-03-01 DIAGNOSIS — R590 Localized enlarged lymph nodes: Secondary | ICD-10-CM

## 2012-03-01 DIAGNOSIS — D509 Iron deficiency anemia, unspecified: Secondary | ICD-10-CM

## 2012-03-01 MED ORDER — IOHEXOL 300 MG/ML  SOLN
75.0000 mL | Freq: Once | INTRAMUSCULAR | Status: AC | PRN
  Administered 2012-03-01: 75 mL via INTRAVENOUS

## 2012-03-02 ENCOUNTER — Encounter (HOSPITAL_COMMUNITY): Payer: Self-pay | Admitting: Emergency Medicine

## 2012-03-02 ENCOUNTER — Other Ambulatory Visit: Payer: Self-pay

## 2012-03-02 ENCOUNTER — Emergency Department (HOSPITAL_COMMUNITY): Payer: Managed Care, Other (non HMO)

## 2012-03-02 ENCOUNTER — Inpatient Hospital Stay (HOSPITAL_COMMUNITY): Payer: Managed Care, Other (non HMO)

## 2012-03-02 ENCOUNTER — Inpatient Hospital Stay (HOSPITAL_COMMUNITY)
Admission: EM | Admit: 2012-03-02 | Discharge: 2012-03-10 | DRG: 286 | Disposition: A | Discharge status: Home / Self Care | Payer: Managed Care, Other (non HMO) | Attending: Internal Medicine | Admitting: Internal Medicine

## 2012-03-02 DIAGNOSIS — R079 Chest pain, unspecified: Secondary | ICD-10-CM | POA: Diagnosis present

## 2012-03-02 DIAGNOSIS — I5032 Chronic diastolic (congestive) heart failure: Secondary | ICD-10-CM

## 2012-03-02 DIAGNOSIS — I2510 Atherosclerotic heart disease of native coronary artery without angina pectoris: Secondary | ICD-10-CM | POA: Diagnosis present

## 2012-03-02 DIAGNOSIS — K5520 Angiodysplasia of colon without hemorrhage: Secondary | ICD-10-CM | POA: Diagnosis present

## 2012-03-02 DIAGNOSIS — D696 Thrombocytopenia, unspecified: Secondary | ICD-10-CM | POA: Diagnosis present

## 2012-03-02 DIAGNOSIS — Z79899 Other long term (current) drug therapy: Secondary | ICD-10-CM

## 2012-03-02 DIAGNOSIS — D509 Iron deficiency anemia, unspecified: Secondary | ICD-10-CM | POA: Diagnosis present

## 2012-03-02 DIAGNOSIS — G4733 Obstructive sleep apnea (adult) (pediatric): Secondary | ICD-10-CM | POA: Diagnosis present

## 2012-03-02 DIAGNOSIS — E785 Hyperlipidemia, unspecified: Secondary | ICD-10-CM | POA: Diagnosis present

## 2012-03-02 DIAGNOSIS — G4700 Insomnia, unspecified: Secondary | ICD-10-CM | POA: Diagnosis present

## 2012-03-02 DIAGNOSIS — I5033 Acute on chronic diastolic (congestive) heart failure: Secondary | ICD-10-CM | POA: Diagnosis present

## 2012-03-02 DIAGNOSIS — I350 Nonrheumatic aortic (valve) stenosis: Secondary | ICD-10-CM

## 2012-03-02 DIAGNOSIS — Z951 Presence of aortocoronary bypass graft: Secondary | ICD-10-CM

## 2012-03-02 DIAGNOSIS — E8779 Other fluid overload: Secondary | ICD-10-CM | POA: Diagnosis present

## 2012-03-02 DIAGNOSIS — M1990 Unspecified osteoarthritis, unspecified site: Secondary | ICD-10-CM | POA: Diagnosis present

## 2012-03-02 DIAGNOSIS — J4489 Other specified chronic obstructive pulmonary disease: Secondary | ICD-10-CM | POA: Diagnosis present

## 2012-03-02 DIAGNOSIS — I10 Essential (primary) hypertension: Secondary | ICD-10-CM | POA: Diagnosis present

## 2012-03-02 DIAGNOSIS — I359 Nonrheumatic aortic valve disorder, unspecified: Principal | ICD-10-CM | POA: Diagnosis present

## 2012-03-02 DIAGNOSIS — E119 Type 2 diabetes mellitus without complications: Secondary | ICD-10-CM | POA: Diagnosis present

## 2012-03-02 DIAGNOSIS — I4891 Unspecified atrial fibrillation: Secondary | ICD-10-CM | POA: Diagnosis present

## 2012-03-02 DIAGNOSIS — I4821 Permanent atrial fibrillation: Secondary | ICD-10-CM | POA: Diagnosis present

## 2012-03-02 DIAGNOSIS — R599 Enlarged lymph nodes, unspecified: Secondary | ICD-10-CM | POA: Diagnosis present

## 2012-03-02 DIAGNOSIS — D649 Anemia, unspecified: Secondary | ICD-10-CM

## 2012-03-02 DIAGNOSIS — I25810 Atherosclerosis of coronary artery bypass graft(s) without angina pectoris: Secondary | ICD-10-CM | POA: Diagnosis present

## 2012-03-02 DIAGNOSIS — D731 Hypersplenism: Secondary | ICD-10-CM | POA: Diagnosis present

## 2012-03-02 DIAGNOSIS — Z7982 Long term (current) use of aspirin: Secondary | ICD-10-CM

## 2012-03-02 DIAGNOSIS — K7460 Unspecified cirrhosis of liver: Secondary | ICD-10-CM | POA: Diagnosis present

## 2012-03-02 DIAGNOSIS — I509 Heart failure, unspecified: Secondary | ICD-10-CM | POA: Diagnosis present

## 2012-03-02 DIAGNOSIS — Q2730 Arteriovenous malformation, site unspecified: Secondary | ICD-10-CM

## 2012-03-02 DIAGNOSIS — D6959 Other secondary thrombocytopenia: Secondary | ICD-10-CM | POA: Diagnosis present

## 2012-03-02 DIAGNOSIS — R188 Other ascites: Secondary | ICD-10-CM

## 2012-03-02 DIAGNOSIS — J449 Chronic obstructive pulmonary disease, unspecified: Secondary | ICD-10-CM | POA: Diagnosis present

## 2012-03-02 DIAGNOSIS — Z8719 Personal history of other diseases of the digestive system: Secondary | ICD-10-CM

## 2012-03-02 DIAGNOSIS — K219 Gastro-esophageal reflux disease without esophagitis: Secondary | ICD-10-CM | POA: Diagnosis present

## 2012-03-02 HISTORY — DX: Nonrheumatic aortic (valve) stenosis: I35.0

## 2012-03-02 HISTORY — DX: Other ascites: R18.8

## 2012-03-02 LAB — BASIC METABOLIC PANEL
CO2: 22 mEq/L (ref 19–32)
Calcium: 9.6 mg/dL (ref 8.4–10.5)
Creatinine, Ser: 0.69 mg/dL (ref 0.50–1.35)
GFR calc non Af Amer: >90 mL/min (ref >90)
Glucose, Bld: 339 mg/dL — ABNORMAL HIGH (ref 70–99)

## 2012-03-02 LAB — COMPREHENSIVE METABOLIC PANEL
AST: 35 U/L (ref 0–37)
Albumin: 3.8 g/dL (ref 3.5–5.2)
BUN: 14 mg/dL (ref 6–23)
CO2: 24 mEq/L (ref 19–32)
Calcium: 9.5 mg/dL (ref 8.4–10.5)
Chloride: 102 mEq/L (ref 96–112)
Creatinine, Ser: 0.65 mg/dL (ref 0.50–1.35)
GFR calc non Af Amer: >90 mL/min (ref >90)
Total Bilirubin: 0.5 mg/dL (ref 0.3–1.2)

## 2012-03-02 LAB — CBC
MCH: 28.5 pg (ref 26.0–34.0)
MCHC: 33.2 g/dL (ref 30.0–36.0)
MCV: 85.9 fL (ref 78.0–100.0)
Platelets: 85 10*3/uL — ABNORMAL LOW (ref 150–400)
RBC: 4.1 MIL/uL — ABNORMAL LOW (ref 4.22–5.81)

## 2012-03-02 LAB — CARDIAC PANEL(CRET KIN+CKTOT+MB+TROPI)
Relative Index: INVALID (ref 0.0–2.5)
Total CK: 40 U/L (ref 7–232)

## 2012-03-02 LAB — GLUCOSE, CAPILLARY: Glucose-Capillary: 309 mg/dL — ABNORMAL HIGH (ref 70–99)

## 2012-03-02 LAB — PROTIME-INR
INR: 1.06 (ref 0.00–1.49)
Prothrombin Time: 14 seconds (ref 11.6–15.2)

## 2012-03-02 LAB — APTT: aPTT: 32 seconds (ref 24–37)

## 2012-03-02 LAB — POCT I-STAT TROPONIN I: Troponin i, poc: 0.02 ng/mL (ref 0.00–0.08)

## 2012-03-02 MED ORDER — NITROGLYCERIN 0.4 MG SL SUBL: 0.4000 mg | SUBLINGUAL_TABLET | SUBLINGUAL | Status: DC | PRN

## 2012-03-02 MED ORDER — POTASSIUM CHLORIDE CRYS ER 20 MEQ PO TBCR
30.0000 meq | EXTENDED_RELEASE_TABLET | Freq: Two times a day (BID) | ORAL | Status: DC
  Administered 2012-03-02 – 2012-03-10 (×16): 30 meq via ORAL
  Filled 2012-03-02 (×18): qty 1

## 2012-03-02 MED ORDER — ISOSORBIDE MONONITRATE ER 30 MG PO TB24
30.0000 mg | ORAL_TABLET | Freq: Every day | ORAL | Status: DC
  Administered 2012-03-02 – 2012-03-10 (×8): 30 mg via ORAL
  Filled 2012-03-02 (×9): qty 1

## 2012-03-02 MED ORDER — ZOLPIDEM TARTRATE 5 MG PO TABS: 5.0000 mg | ORAL_TABLET | Freq: Every evening | ORAL | Status: DC | PRN

## 2012-03-02 MED ORDER — METOPROLOL TARTRATE 50 MG PO TABS
50.0000 mg | ORAL_TABLET | Freq: Two times a day (BID) | ORAL | Status: DC
  Administered 2012-03-02 – 2012-03-03 (×4): 50 mg via ORAL
  Filled 2012-03-02 (×6): qty 1

## 2012-03-02 MED ORDER — PANTOPRAZOLE SODIUM 40 MG PO TBEC
40.0000 mg | DELAYED_RELEASE_TABLET | Freq: Two times a day (BID) | ORAL | Status: DC
  Administered 2012-03-03 – 2012-03-10 (×15): 40 mg via ORAL
  Filled 2012-03-02 (×14): qty 1

## 2012-03-02 MED ORDER — METOPROLOL TARTRATE 1 MG/ML IV SOLN
2.5000 mg | Freq: Once | INTRAVENOUS | Status: AC
  Administered 2012-03-02: 2.5 mg via INTRAVENOUS
  Filled 2012-03-02: qty 5

## 2012-03-02 MED ORDER — FERROUS GLUCONATE 324 (38 FE) MG PO TABS
324.0000 mg | ORAL_TABLET | Freq: Three times a day (TID) | ORAL | Status: DC
  Administered 2012-03-02 – 2012-03-10 (×22): 324 mg via ORAL
  Filled 2012-03-02 (×27): qty 1

## 2012-03-02 MED ORDER — FUROSEMIDE 10 MG/ML IJ SOLN
60.0000 mg | Freq: Two times a day (BID) | INTRAMUSCULAR | Status: DC
  Administered 2012-03-02 – 2012-03-06 (×8): 60 mg via INTRAVENOUS
  Filled 2012-03-02 (×9): qty 6

## 2012-03-02 MED ORDER — LISINOPRIL 10 MG PO TABS
10.0000 mg | ORAL_TABLET | Freq: Every day | ORAL | Status: DC
  Administered 2012-03-03 – 2012-03-10 (×6): 10 mg via ORAL
  Filled 2012-03-02 (×8): qty 1

## 2012-03-02 MED ORDER — NITROGLYCERIN IN D5W 200-5 MCG/ML-% IV SOLN
5.0000 ug/min | Freq: Once | INTRAVENOUS | Status: AC
  Administered 2012-03-02: 5 ug/min via INTRAVENOUS

## 2012-03-02 MED ORDER — MORPHINE SULFATE 4 MG/ML IJ SOLN
4.0000 mg | INTRAMUSCULAR | Status: DC | PRN
  Administered 2012-03-02: 4 mg via INTRAVENOUS
  Filled 2012-03-02: qty 1

## 2012-03-02 MED ORDER — MORPHINE SULFATE 2 MG/ML IJ SOLN
INTRAMUSCULAR | Status: AC
  Filled 2012-03-02: qty 1

## 2012-03-02 MED ORDER — MORPHINE SULFATE 2 MG/ML IJ SOLN
2.0000 mg | INTRAMUSCULAR | Status: DC | PRN
  Administered 2012-03-07: 4 mg via INTRAVENOUS
  Filled 2012-03-02: qty 2

## 2012-03-02 MED ORDER — INSULIN ASPART 100 UNIT/ML ~~LOC~~ SOLN
0.0000 [IU] | Freq: Three times a day (TID) | SUBCUTANEOUS | Status: DC
  Administered 2012-03-03: 8 [IU] via SUBCUTANEOUS
  Administered 2012-03-03: 5 [IU] via SUBCUTANEOUS
  Administered 2012-03-03: 11 [IU] via SUBCUTANEOUS
  Administered 2012-03-04: 5 [IU] via SUBCUTANEOUS
  Administered 2012-03-04: 11 [IU] via SUBCUTANEOUS
  Administered 2012-03-04: 8 [IU] via SUBCUTANEOUS
  Administered 2012-03-05 – 2012-03-06 (×5): 5 [IU] via SUBCUTANEOUS
  Administered 2012-03-06: 8 [IU] via SUBCUTANEOUS
  Administered 2012-03-07: 3 [IU] via SUBCUTANEOUS
  Administered 2012-03-07: 5 [IU] via SUBCUTANEOUS
  Administered 2012-03-08: 3 [IU] via SUBCUTANEOUS
  Administered 2012-03-08: 11 [IU] via SUBCUTANEOUS
  Administered 2012-03-08: 5 [IU] via SUBCUTANEOUS
  Administered 2012-03-09: 3 [IU] via SUBCUTANEOUS
  Administered 2012-03-09: 5 [IU] via SUBCUTANEOUS
  Administered 2012-03-09: 3 [IU] via SUBCUTANEOUS
  Administered 2012-03-10: 5 [IU] via SUBCUTANEOUS
  Administered 2012-03-10: 3 [IU] via SUBCUTANEOUS

## 2012-03-02 MED ORDER — NITROGLYCERIN IN D5W 200-5 MCG/ML-% IV SOLN
INTRAVENOUS | Status: AC
  Filled 2012-03-02: qty 250

## 2012-03-02 MED ORDER — VERAPAMIL HCL 80 MG PO TABS
80.0000 mg | ORAL_TABLET | Freq: Three times a day (TID) | ORAL | Status: DC
  Administered 2012-03-02 – 2012-03-10 (×21): 80 mg via ORAL
  Filled 2012-03-02 (×25): qty 1

## 2012-03-02 MED ORDER — ASPIRIN 325 MG PO TABS
325.0000 mg | ORAL_TABLET | ORAL | Status: AC
  Administered 2012-03-02: 325 mg via ORAL
  Filled 2012-03-02: qty 1

## 2012-03-02 MED ORDER — ATORVASTATIN CALCIUM 20 MG PO TABS
20.0000 mg | ORAL_TABLET | Freq: Every day | ORAL | Status: DC
  Administered 2012-03-02 – 2012-03-09 (×8): 20 mg via ORAL
  Filled 2012-03-02 (×9): qty 1

## 2012-03-02 MED ORDER — ACETAMINOPHEN 325 MG PO TABS
650.0000 mg | ORAL_TABLET | ORAL | Status: DC | PRN
  Administered 2012-03-03 – 2012-03-04 (×3): 650 mg via ORAL
  Filled 2012-03-02 (×3): qty 2

## 2012-03-02 MED ORDER — PANTOPRAZOLE SODIUM 40 MG PO TBEC
40.0000 mg | DELAYED_RELEASE_TABLET | Freq: Every day | ORAL | Status: DC
  Administered 2012-03-02: 40 mg via ORAL
  Filled 2012-03-02: qty 1

## 2012-03-02 MED ORDER — GI COCKTAIL ~~LOC~~
30.0000 mL | Freq: Three times a day (TID) | ORAL | Status: DC | PRN
  Filled 2012-03-02: qty 30

## 2012-03-02 MED ORDER — LINAGLIPTIN 5 MG PO TABS
5.0000 mg | ORAL_TABLET | Freq: Every day | ORAL | Status: DC
  Administered 2012-03-02 – 2012-03-06 (×5): 5 mg via ORAL
  Filled 2012-03-02 (×9): qty 1

## 2012-03-02 MED ORDER — FUROSEMIDE 10 MG/ML IJ SOLN
INTRAMUSCULAR | Status: AC
  Filled 2012-03-02: qty 8

## 2012-03-02 MED ORDER — SODIUM CHLORIDE 0.9 % IV SOLN
INTRAVENOUS | Status: DC
  Administered 2012-03-02: 08:00:00 via INTRAVENOUS

## 2012-03-02 MED ORDER — FERROUS GLUCONATE 324 (38 FE) MG PO TABS: 325.0000 mg | ORAL_TABLET | Freq: Three times a day (TID) | ORAL | Status: DC

## 2012-03-02 MED ORDER — ASPIRIN EC 325 MG PO TBEC
325.0000 mg | DELAYED_RELEASE_TABLET | Freq: Every day | ORAL | Status: DC
  Administered 2012-03-03 – 2012-03-10 (×7): 325 mg via ORAL
  Filled 2012-03-02 (×7): qty 1

## 2012-03-02 MED ORDER — SODIUM CHLORIDE 0.9 % IJ SOLN: 3.0000 mL | INTRAMUSCULAR | Status: DC | PRN

## 2012-03-02 MED ORDER — SODIUM CHLORIDE 0.9 % IV SOLN: 250.0000 mL | INTRAVENOUS | Status: DC | PRN

## 2012-03-02 MED ORDER — GI COCKTAIL ~~LOC~~
30.0000 mL | Freq: Once | ORAL | Status: AC
  Administered 2012-03-02: 30 mL via ORAL
  Filled 2012-03-02: qty 30

## 2012-03-02 MED ORDER — NITROGLYCERIN IN D5W 200-5 MCG/ML-% IV SOLN: 2.0000 ug/min | INTRAVENOUS | Status: DC

## 2012-03-02 MED ORDER — NITROGLYCERIN 0.4 MG SL SUBL
0.4000 mg | SUBLINGUAL_TABLET | SUBLINGUAL | Status: DC | PRN
  Administered 2012-03-02 (×3): 0.4 mg via SUBLINGUAL
  Filled 2012-03-02: qty 25

## 2012-03-02 MED ORDER — ALUM & MAG HYDROXIDE-SIMETH 200-200-20 MG/5ML PO SUSP
30.0000 mL | ORAL | Status: DC | PRN
  Filled 2012-03-02: qty 30

## 2012-03-02 MED ORDER — GLIMEPIRIDE 4 MG PO TABS
4.0000 mg | ORAL_TABLET | Freq: Every day | ORAL | Status: DC
  Administered 2012-03-02 – 2012-03-09 (×8): 4 mg via ORAL
  Filled 2012-03-02 (×9): qty 1

## 2012-03-02 MED ORDER — DIGOXIN 250 MCG PO TABS
250.0000 ug | ORAL_TABLET | Freq: Every day | ORAL | Status: DC
  Administered 2012-03-03 – 2012-03-10 (×7): 250 ug via ORAL
  Filled 2012-03-02 (×8): qty 1

## 2012-03-02 MED ORDER — ALUM & MAG HYDROXIDE-SIMETH 200-200-20 MG/5ML PO SUSP
30.0000 mL | ORAL | Status: DC | PRN
  Administered 2012-03-02: 30 mL via ORAL

## 2012-03-02 MED ORDER — SODIUM CHLORIDE 0.9 % IJ SOLN
3.0000 mL | Freq: Two times a day (BID) | INTRAMUSCULAR | Status: DC
  Administered 2012-03-03 – 2012-03-06 (×6): 3 mL via INTRAVENOUS

## 2012-03-02 MED ORDER — ONDANSETRON HCL 4 MG/2ML IJ SOLN: 4.0000 mg | Freq: Four times a day (QID) | INTRAMUSCULAR | Status: DC | PRN

## 2012-03-02 NOTE — ED Notes (Signed)
Pt able to travel off of telemetry for [place] Paracentesis per Dr. Tenny Craw. Pt will then be transported to 2919 per ultrasound tech.

## 2012-03-02 NOTE — ED Notes (Signed)
Admission MD at bedside.  

## 2012-03-02 NOTE — Progress Notes (Signed)
Called to see pt re: chest pain Pt had [place] for paracentesis but no fluid collection seen. Pt then developed chest pain, unrelieved by increased IV NTG. Pt ECG w/ CP showed atrial fib w/ controlled rate and no ST elevation. He was seen by Dr Tenny Craw. Repeat ez neg for MI. Will Rx symptoms PRN, try GI cocktail as well and cont to monitor closely.

## 2012-03-02 NOTE — ED Notes (Signed)
Pt now describes pain as epigastric- states he feels "fluid in his abdomen is pushing up". MD Clarene Duke made aware and will hold nitro drip until re-evaluated.

## 2012-03-02 NOTE — Procedures (Signed)
Procedure : limited ultrasound of the abdomen and pelvis Findings : no evidence of ascites  Images obtained for documentation - no paracentesis performed.

## 2012-03-02 NOTE — Care Management Note (Signed)
    Page 1 of 1   03/02/2012     3:11:39 PM   CARE MANAGEMENT NOTE 03/02/2012  Patient:  [name], [name]   Account Number:  [id number]  Date Initiated:  03/02/2012  Documentation initiated by:  Junius Creamer  Subjective/Objective Assessment:   adm w ch pain     Action/Plan:   lives w wife, pcp dr Silvestre Moment   Anticipated DC Date:     Anticipated DC Plan:        DC Planning Services  CM consult      Choice offered to / List presented to:             Status of service:   Medicare Important Message given?   (If response is "NO", the following Medicare IM given date fields will be blank) Date Medicare IM given:   Date Additional Medicare IM given:    Discharge Disposition:    Per UR Regulation:  Reviewed for med. necessity/level of care/duration of stay  If discussed at Long Length of Stay Meetings, dates discussed:    Comments:  8/9 15:10p debbie Arabia Nylund rn,bsn [phone]

## 2012-03-02 NOTE — ED Provider Notes (Signed)
History     CSN: [id number]  Arrival date & time 03/02/12  0720   First MD Initiated Contact with Patient 03/02/12 0800      Chief Complaint  Patient presents with  . Chest Pain    HPI Pt was seen at 0800.  Per pt, c/o gradual onset and persistence of constant mid-sternal chest "pain" that woke him up from sleep approx 0300 PTA.  Describes the CP as "dull pressure."  Has been associated with SOB, nausea and diaphoresis.  Denies back pain, no vomiting/diarrhea, no abd pain, no fevers, no cough, no palpitations.      Past Medical History  Diagnosis Date  . Overweight   . Coronary atherosclerosis of artery bypass graft   . Atrial fibrillation     Permanent; off of Coumadin for now due to GI bleed  . Shortness of breath   . Type II or unspecified type diabetes mellitus without mention of complication, not stated as uncontrolled   . OSA (obstructive sleep apnea)   . Insomnia   . Carotid bruit 06/14/2011  . Angina   . Hypertension   . CHF (congestive heart failure)   . GERD (gastroesophageal reflux disease)   . COPD (chronic obstructive pulmonary disease)   . Iron deficiency anemia     Requiring intravenous iron  . H/O hiatal hernia   . AVM (arteriovenous malformation)     Recurrent GI bleeding requiring multiple transfusions  . Hyperlipidemia   . Thrombocytopenia   . Ascites     status post paracentesis with removal of 3.4 L of ascitic fluid  . Osteoarthritis   . Mediastinal adenopathy 09/22/2011  . Cirrhosis   . Iron deficiency anemia   . Mediastinal adenopathy 09/22/2011    Past Surgical History  Procedure Date  . Coronary artery bypass graft  10/15/2002     [facility] Dec[facility]. Dorris Fetch, M.D.     . Carpal tunnel release   10/08/2003  . Lipoma surgery   . Hernia repair   . Other surgical history 08/26/2011    [facility],  enteroscopy , revealing "three-four AVMs."     Family History  Problem Relation Age of Onset  . Coronary artery disease      FAMILY HISTORY  .  Hypertension Father   . Diabetes Father   . Heart disease Father   . COPD Sister   . Cancer Maternal Aunt     Breast cancer   . Cancer Maternal Aunt     Breast cancer   . Diabetes Son   . Cancer Daughter     Cervical cancer    History  Substance Use Topics  . Smoking status: Former Smoker -- 1.0 packs/day for 30 years    Types: Cigarettes    Quit date: 07/25/2000  . Smokeless tobacco: Never Used  . Alcohol Use: 0.6 oz/week    1 Shots of liquor per week     Occasionally      Review of Systems ROS: Statement: All systems negative except as marked or noted in the HPI; Constitutional: Negative for fever and chills. ; ; Eyes: Negative for eye pain, redness and discharge. ; ; ENMT: Negative for ear pain, hoarseness, nasal congestion, sinus pressure and sore throat. ; ; Cardiovascular: Negative for  palpitations, peripheral edema.  +CP, diaphoresis, dyspnea. ; ; Respiratory: Negative for cough, wheezing and stridor. ; ; Gastrointestinal: +nausea. Negative for vomiting, diarrhea, abdominal pain, blood in stool, hematemesis, jaundice and rectal bleeding. . ; ; Genitourinary: Negative for dysuria,  flank pain and hematuria. ; ; Musculoskeletal: Negative for back pain and neck pain. Negative for swelling and trauma.; ; Skin: Negative for pruritus, rash, abrasions, blisters, bruising and skin lesion.; ; Neuro: Negative for headache, lightheadedness and neck stiffness. Negative for weakness, altered level of consciousness , altered mental status, extremity weakness, paresthesias, involuntary movement, seizure and syncope.     Allergies  Codeine; Diltiazem hcl; and Niacin  Home Medications   Current Outpatient Rx  Name Route Sig Dispense Refill  . VITAMIN C 1000 MG PO TABS Oral Take 1,000 mg by mouth 2 (two) times daily.    . ASPIRIN EC 81 MG PO TBEC Oral Take 1 tablet (81 mg total) by mouth daily.    . ATORVASTATIN CALCIUM 20 MG PO TABS Oral Take 20 mg by mouth at bedtime.     [name] DIGOXIN  0.25 MG PO TABS Oral Take 1 tablet (250 mcg total) by mouth daily. 90 tablet 3  . FERROUS GLUCONATE 325 MG PO TABS Oral Take 325 mg by mouth 3 (three) times daily with meals.    . FUROSEMIDE 80 MG PO TABS Oral Take 80 mg by mouth 2 (two) times daily.    [name] GLIMEPIRIDE 4 MG PO TABS Oral Take 4 mg by mouth daily with supper.     . ISOSORBIDE MONONITRATE ER 30 MG PO TB24 Oral Take 1 tablet (30 mg total) by mouth daily. 30 tablet 11  . LISINOPRIL 10 MG PO TABS Oral Take 1 tablet (10 mg total) by mouth daily. 90 tablet 3  . METFORMIN HCL 1000 MG PO TABS Oral Take 1,000 mg by mouth 2 (two) times daily with a meal.     . METOPROLOL TARTRATE 50 MG PO TABS Oral Take 1 tablet (50 mg total) by mouth 2 (two) times daily. 270 tablet 3  . ONE-DAILY MULTI VITAMINS PO TABS Oral Take 1 tablet by mouth daily.     [name] NITROGLYCERIN 0.4 MG SL SUBL Sublingual Place 0.4 mg under the tongue every 5 (five) minutes x 3 doses as needed. For chest pain    . FISH OIL 1200 MG PO CAPS Oral Take 1,200 mg by mouth 2 (two) times daily.     [name] OMEPRAZOLE 20 MG PO CPDR Oral Take 20 mg by mouth 2 (two) times daily.     [name] POTASSIUM CHLORIDE ER 10 MEQ PO TBCR Oral Take 10 mEq by mouth 2 (two) times daily.    [name] SITAGLIPTIN PHOSPHATE 100 MG PO TABS Oral Take 100 mg by mouth daily.     [name] VERAPAMIL HCL 80 MG PO TABS Oral Take 80 mg by mouth 3 (three) times daily.    [name] NITROGLYCERIN 0.4 MG SL SUBL Sublingual Place 1 tablet (0.4 mg total) under the tongue every 5 (five) minutes as needed for chest pain. 25 tablet 3    BP 147/85  Pulse 102  Temp 97.2 F (36.2 C) (Oral)  Resp 16  SpO2 95%  Physical Exam 0805: Physical examination:  Nursing notes reviewed; Vital signs and O2 SAT reviewed;  Constitutional: Well developed, Well nourished, Well hydrated, Uncomfortable appearing; Head:  Normocephalic, atraumatic; Eyes: EOMI, PERRL, No scleral icterus; ENMT: Mouth and pharynx normal, Mucous membranes moist; Neck: Supple, Full range of motion,  No lymphadenopathy; Cardiovascular: Irregular irregular rate and rhythm, No gallop; Respiratory: Breath sounds coarse & equal bilaterally, No wheezes.  Speaking full sentences with ease, Normal respiratory effort/excursion; Chest: Nontender, Movement normal; Abdomen: Soft, +mild mid-epigastric tenderness to palp.  No rebound or guarding. Nondistended, Normal bowel sounds; Genitourinary: No CVA tenderness; Extremities: Pulses normal, No tenderness, No edema, No calf edema or asymmetry.; Neuro: AA&Ox3, Major CN grossly intact.  Speech clear. No gross focal motor or sensory deficits in extremities.; Skin: Color normal, Warm, Dry.    ED Course  Procedures   [phone]:  EKG without acute STTW elevations.  Will dose ASA, ntg.  0830:  States ntg improved his discomfort "some."  Will dose morphine.  5409:  Continues to c/o pain.  ED RN gave GI cocktail 1st; no improvement.  Will start IV ntg gtt.  Will repeat troponin and EKG (1st EKG without acute ST elevation and troponin negative).  0945:  ED RN to start ntg gtt.  T/C to [employer], case discussed, including:  HPI, pertinent PM/SHx, VS/PE, dx testing, ED course and treatment:  Agreeable to come to ED to eval to admit.   1040:  2nd troponin negative for elevation.  Pt states he is "feeling better now" on the IV ntg gtt.     MDM  MDM Reviewed: previous chart, nursing note and vitals Reviewed previous: labs and ECG Interpretation: labs, ECG and x-ray Total time providing critical care: 30-74 minutes. This excludes time spent performing separately reportable procedures and services. Consults: cardiology   CRITICAL CARE Performed by: Laray Anger Total critical care time: 31 Critical care time was exclusive of separately billable procedures and treating other patients. Critical care was necessary to treat or prevent imminent or life-threatening deterioration. Critical care was time spent personally by me on the following activities:  development of treatment plan with patient and/or surrogate as well as nursing, discussions with consultants, evaluation of patient's response to treatment, examination of patient, obtaining history from patient or surrogate, ordering and performing treatments and interventions, ordering and review of laboratory studies, ordering and review of radiographic studies, pulse oximetry and re-evaluation of patient's condition.    Date: 03/02/2012  Rate: 102  Rhythm: atrial fibrillation and premature ventricular contractions (PVC)  QRS Axis: normal  Intervals: normal  ST/T Wave abnormalities: nonspecific ST/T changes ant leads  Conduction Disutrbances:nonspecific intraventricular conduction delay  Narrative Interpretation:   Old EKG Reviewed: changes noted; IVCD new since previous EKG dated 08/27/2011; T-wave changes similar to EKG dated 05/29/2011.   Date: 03/02/2012  While on ntg gtt  Rate: 126  Rhythm: atrial fibrillation  QRS Axis: normal  Intervals: normal  ST/T Wave abnormalities: nonspecific ST/T changes  Conduction Disutrbances:nonspecific intraventricular conduction delay  Narrative Interpretation:   Old EKG Reviewed: unchanged; no significant changes from previous EKG completed today.     Results for orders placed during the hospital encounter of 03/02/12  CBC      Component Value Range   WBC 5.7  4.0 - 10.5 K/uL   RBC 4.10 (*) 4.22 - 5.81 MIL/uL   Hemoglobin 11.7 (*) 13.0 - 17.0 g/dL   HCT 81.1 (*) 91.4 - 78.2 %   MCV 85.9  78.0 - 100.0 fL   MCH 28.5  26.0 - 34.0 pg   MCHC 33.2  30.0 - 36.0 g/dL   RDW 95.6 (*) 21.3 - 08.6 %   Platelets 85 (*) 150 - 400 K/uL  BASIC METABOLIC PANEL      Component Value Range   Sodium 137  135 - 145 mEq/L   Potassium 4.3  3.5 - 5.1 mEq/L   Chloride 102  96 - 112 mEq/L   CO2 22  19 - 32 mEq/L   Glucose, Bld  339 (*) 70 - 99 mg/dL   BUN 16  6 - 23 mg/dL   Creatinine, Ser 2.95  0.50 - 1.35 mg/dL   Calcium 9.6  8.4 - 28.4 mg/dL   GFR calc non  Af Amer >90  >90 mL/min   GFR calc Af Amer >90  >90 mL/min  POCT I-STAT TROPONIN I      Component Value Range   Troponin i, poc 0.02  0.00 - 0.08 ng/mL   Comment 3           DIGOXIN LEVEL      Component Value Range   Digoxin Level 0.6 (*) 0.8 - 2.0 ng/mL  TROPONIN I      Component Value Range   Troponin I <0.30  <0.30 ng/mL   Ct Chest W Contrast 03/01/2012  *RADIOLOGY REPORT*  Clinical Data: Mediastinal adenopathy follow-up  CT CHEST WITH CONTRAST  Technique:  Multidetector CT imaging of the chest was performed following the standard protocol during bolus administration of intravenous contrast.  Contrast: 75mL OMNIPAQUE IOHEXOL 300 MG/ML  SOLN  Comparison: 08/27/2011  Findings: No axillary or supraclavicular lymph nodes. Again noted are prominent mediastinal and hilar lymph nodes.  The subcarinal lymph node measures 1.1 cm, image 29.  Improved from 1.5 cm.  Left paratracheal lymph node measures 0.8 cm, image 20.  Previously 1.3 cm.  Right hilar lymph node measures 0.8 cm, image number 33. Previously 1.5 cm.  Previous median sternotomy and CABG procedure.  Pulmonary parenchymal nodule in the left upper lobe measures 5.9 mm, image 23.  Unchanged from 08/27/2011 but new from 05/23/2007.  Stable, noncalcified nodule in the right base measures 4.6 mm, image 43. Bilateral lower lobe calcified granulomas noted.  Review of the visualized osseous structures is significant for multilevel thoracic spondylosis.  Several lucent lesions are identified within the lumbar spine.  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  Limited imaging through the upper abdomen is significant for fatty infiltration of the liver.  IMPRESSION:  1.  No acute cardiopulmonary abnormalities. 2.  Interval improvement in mediastinal and hilar adenopathy. 3.  Calcified and noncalcified nodules are likely related to prior granulomatous disease.  Within the left upper lobe there is a 5.9 cm noncalcified nodule which is unchanged from  08/27/2011.  Given the given the previous smoking history I would advise follow-up imaging to confirm stability of this and other noncalcified nodules. The next follow-up exam should be obtained at 18-24 months.  Original Report Authenticated By: Rosealee Albee, M.D.   Dg Chest Port 1 View 03/02/2012  *RADIOLOGY REPORT*  Clinical Data: Rule out infiltrate  PORTABLE CHEST - 1 VIEW  Comparison: 03/01/2012  Findings: Previous median sternotomy and CABG procedure.  Heart size appears enlarged.  No pleural effusion or edema.  No airspace consolidation.  IMPRESSION:  1.  No acute cardiopulmonary abnormalities.  Original Report Authenticated By: Rosealee Albee, M.D.           Laray Anger, DO 03/03/12 1421

## 2012-03-02 NOTE — Progress Notes (Signed)
Ultrasound imaging performed throughout the abdomen and pelvis - no evidence of ascites - images obtained documenting same.  No paracentesis performed.

## 2012-03-02 NOTE — H&P (Signed)
History and Physical   Patient ID: [name] MRN: [id number], DOB/AGE: 64-May-1947   Admit date: 03/02/2012 Date of Consult: 03/02/2012   Primary Physician: [name], MD Primary Cardiologist: Earnestine Leys, Michelle Piper, MD  HPI:  Mr. 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   The patient had an essentially normal Myoview in 12/2010, however complained of progressive dyspnea. He had a normal Myoview in the past also, but was ultimately found to have severe CAD requiring CABG. Thus, cardiac catheterization was pursed in 01/21/11 revealing normal left main, diffuse 50-60% prox LAD stenosis, 80% mid LAD stenosis, distal vessel fills competitively from the LIMA graft and a diagonal, prox LCx patent, 75% mid LCx stenosis, RCA with mild luminal irregularities; LIMA-LAD patent, SVG-D1 patent with patent distal stent, free radial graft-OM patent. Medical therapy was recommended, and dyspnea was felt to be non-cardiac.   He reports experiencing lower dull constant substernal chest pain/epigastric pain without radiation around 3:00 AM rated at a 8/10 initially, and now 3-4/10. He reports baseline shortness of breath, but has not noticed a considerable decrease in this. He reports increase abdominal swelling and a full feeling which he attributes to some of his shortness of breath. He did have an US guided paracentesis performed in 09/2011. He reports a 2 lbs weight increase, but admittedly does not take it everyday. No lightheadedness, diaphoresis,  n/v. No LE edema, orthopnea, PND. No history of reflux. No relation to meals. He is able to walk about 800 yards without issue. No pattern with exertion. Denies fevers, chills, new cough. No aggravation with deep inspiration or laying flat. Not reproducible by palpation. He reports medication compliance. He does note chest burning with stress testing leading to his first cardiac catheterization, but states this sensation is different. He received NTG SL x 3 without relief, and was placed on a NTG gtt which reduced the pain to 3-4/10. Of note, he also received a GI cocktail and morphine.  Upon ED arrival, EKG reveals atrial fibrillation with controlled VR, no evidence of ischemia. POC trop-I WNL x 2. Unremarkable BMET. CBC with mildly reduced H/H from prior reading in 01/2012, thrombocytopenia at 85K (108K in 01/2012). Low digoxin level at 0.6. Hyperglycemia at 339. He currently endorses chest pain rated at 3-4/10, but is comfortable.   Regarding his GI bleeding, he reports recurrent GIB while on Coumadin. He underwent considerable work-up from 09-05/2011 for this. He reports two endoscopies and a colonoscopy in 04/2011. He reports another diagnostic study around 04/2011. Coumadin was held and apparently "a few spots" were found, but not actively bleeding. He has since been off the Coumadin, but a recent office note from Dr. Gaylyn Rong recommends considering re-initiating as risk of thromboembolic event is higher than GIB.   Problem List: Past Medical History  Diagnosis Date  . Overweight   . Coronary atherosclerosis of artery bypass graft   . Atrial fibrillation     Permanent; off of Coumadin for now due to GI bleed  . Shortness of breath   . Type II or unspecified type diabetes mellitus without mention of complication, not stated  as uncontrolled   . OSA (obstructive sleep apnea)   . Insomnia   . Carotid bruit 06/14/2011  . Angina   . Hypertension   . CHF (congestive heart failure)   . GERD  (gastroesophageal reflux disease)   . COPD (chronic obstructive pulmonary disease)   . Iron deficiency anemia     Requiring intravenous iron  . H/O hiatal hernia   . AVM (arteriovenous malformation)     Recurrent GI bleeding requiring multiple transfusions  . Hyperlipidemia   . Thrombocytopenia   . Ascites     status post paracentesis with removal of 3.4 L of ascitic fluid  . Osteoarthritis   . Mediastinal adenopathy 09/22/2011  . Cirrhosis   . Iron deficiency anemia   . Mediastinal adenopathy 09/22/2011    Past Surgical History  Procedure Date  . Coronary artery bypass graft  10/15/2002     Salvatore Decent. Dorris Fetch, M.D.     . Carpal tunnel release   10/08/2003  . Lipoma surgery   . Hernia repair   . Other surgical history 08/26/2011    [facility],  enteroscopy , revealing "three-four AVMs."      Allergies:  Allergies  Allergen Reactions  . Codeine Other (See Comments)    Hurting in chest  . Diltiazem Hcl Itching  . Niacin Other (See Comments)    Hot flashes    Home Medications: Prior to Admission medications   Medication Sig Start Date End Date Taking? Authorizing Provider  Ascorbic Acid (VITAMIN C) 1000 MG tablet Take 1,000 mg by mouth 2 (two) times daily.   Yes Historical Provider, MD  aspirin EC 81 MG tablet Take 1 tablet (81 mg total) by mouth daily. 04/20/11 04/19/12 Yes June Leap, MD  atorvastatin (LIPITOR) 20 MG tablet Take 20 mg by mouth at bedtime.    Yes Historical Provider, MD  digoxin (LANOXIN) 0.25 MG tablet Take 1 tablet (250 mcg total) by mouth daily. 04/27/11  Yes June Leap, MD  ferrous gluconate (FERGON) 325 MG tablet Take 325 mg by mouth 3 (three) times daily with meals.   Yes Historical Provider, MD  furosemide (LASIX) 80 MG tablet Take 80 mg by mouth 2 (two) times daily.   Yes Historical Provider, MD  glimepiride (AMARYL) 4 MG tablet Take 4 mg by mouth daily with supper.    Yes Historical Provider, MD  isosorbide mononitrate (IMDUR) 30 MG 24 hr tablet  Take 1 tablet (30 mg total) by mouth daily. 08/28/11 08/27/12 Yes Rhonda G Barrett, PA  lisinopril (PRINIVIL,ZESTRIL) 10 MG tablet Take 1 tablet (10 mg total) by mouth daily. 01/17/12  Yes June Leap, MD  metFORMIN (GLUCOPHAGE) 1000 MG tablet Take 1,000 mg by mouth 2 (two) times daily with a meal.    Yes Historical Provider, MD  metoprolol (LOPRESSOR) 50 MG tablet Take 1 tablet (50 mg total) by mouth 2 (two) times daily. 12/29/11  Yes June Leap, MD  Multiple Vitamin (MULTIVITAMIN) tablet Take 1 tablet by mouth daily.    Yes Historical Provider, MD  nitroGLYCERIN (NITROSTAT) 0.4 MG SL tablet Place 0.4 mg under the tongue every 5 (five) minutes x 3 doses as needed. For chest pain   Yes Historical Provider, MD  Omega-3 Fatty Acids (FISH OIL) 1200 MG CAPS Take 1,200 mg by mouth 2 (two) times daily.    Yes Historical Provider, MD  omeprazole (PRILOSEC) 20 MG capsule Take 20 mg by mouth 2 (two) times daily.  Yes Historical Provider, MD  potassium chloride (K-DUR) 10 MEQ tablet Take 10 mEq by mouth 2 (two) times daily.   Yes Historical Provider, MD  sitaGLIPtan (JANUVIA) 100 MG tablet Take 100 mg by mouth daily.    Yes Historical Provider, MD  verapamil (CALAN) 80 MG tablet Take 80 mg by mouth 3 (three) times daily.   Yes Historical Provider, MD  nitroGLYCERIN (NITROSTAT) 0.4 MG SL tablet Place 1 tablet (0.4 mg total) under the tongue every 5 (five) minutes as needed for chest pain. 01/17/11 01/17/12  June Leap, MD    Inpatient Medications:     . aspirin  325 mg Oral STAT  . gi cocktail  30 mL Oral Once  . nitroGLYCERIN  5-200 mcg/min Intravenous Once    (Not in a hospital admission)  Family History  Problem Relation Age of Onset  . Coronary artery disease      FAMILY HISTORY  . Hypertension Father   . Diabetes Father   . Heart disease Father   . COPD Sister   . Cancer Maternal Aunt     Breast cancer   . Cancer Maternal Aunt     Breast cancer   . Diabetes Son   . Cancer Daughter      Cervical cancer     History   Social History  . Marital Status: Married    Spouse Name: N/A    Number of Children: 3  . Years of Education: N/A   Occupational History  .      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 Smoking status: Former Smoker -- 1.0 packs/day for 30 years    Types: Cigarettes    Quit date: 07/25/2000  . Smokeless tobacco: Never Used  . Alcohol Use: 0.6 oz/week    1 Shots of liquor per week     Occasionally  . Drug Use: No  . Sexually Active: Yes   Other Topics Concern  . Not on file   Social History Narrative   Lives in [place], [place] with wife.      Review of Systems: General: negative for chills, fever, night sweats or weight changes.  Cardiovascular: positive for chest pain, dyspnea on exertion, edema, shortness of breath, negative for orthopnea, palpitations, paroxysmal nocturnal dyspnea Dermatological: negative for rash Respiratory: negative for cough or wheezing Urologic: negative for hematuria Abdominal: negative for nausea, vomiting, diarrhea, bright red blood per rectum, melena, or hematemesis Neurologic:  negative for visual changes, syncope, or dizziness All other systems reviewed and are otherwise negative except as noted above.  Physical Exam: Blood pressure 129/73, pulse 117, temperature 97.2 F (36.2 C), temperature source Oral, resp. rate 18, SpO2 97.00%.    General: Obese, appears older than stated age, in no acute distress. Head: Normocephalic, atraumatic, sclera non-icteric, no xanthomas, nares are without discharge.  Neck: Negative for carotid bruits. JVD difficult to assess given body habitus/redundant neck tissue Lungs: Clear bilaterally to auscultation without wheezes, rales, or rhonchi. Breathing is unlabored. Heart: Irregularly irregular, with clear S1 S2. III/VI systolic crescendo-decrescendo murmur at LUSB. No rubs or gallops appreciated. Abdomen: Non-tender, distended with hypoactive bowel sounds.  Palpable liver edge with associated hepatomegaly. No rebound/guarding. No obvious abdominal masses. Msk:  Strength and tone appears normal for age. Extremities: Trace to 1+ bilateral pedal/pretibial edema. No clubbing or cyanosis.  Distal pedal pulses are 2+ and equal bilaterally. Neuro: Alert and oriented X 3. Moves all extremities spontaneously. Psych:  Responds to  questions appropriately with a normal affect.  Labs: Recent Labs  Basename 03/02/12 0745   WBC 5.7   HGB 11.7*   HCT 35.2*   MCV 85.9   PLT 85*   Lab 03/02/12 0745  NA 137  K 4.3  CL 102  CO2 22  BUN 16  CREATININE 0.69  CALCIUM 9.6  PROT --  BILITOT --  ALKPHOS --  ALT --  AST --  AMYLASE --  LIPASE --  GLUCOSE 339*   Recent Labs  Basename 03/02/12 0950   CKTOTAL --   CKMB --   CKMBINDEX --   TROPONINI <0.30    Radiology/Studies: Ct Chest W Contrast  03/01/2012  *RADIOLOGY REPORT*  Clinical Data: Mediastinal adenopathy follow-up  CT CHEST WITH CONTRAST  Technique:  Multidetector CT imaging of the chest was performed following the standard protocol during bolus administration of intravenous contrast.  Contrast: 75mL OMNIPAQUE IOHEXOL 300 MG/ML  SOLN  Comparison: 08/27/2011  Findings: No axillary or supraclavicular lymph nodes. Again noted are prominent mediastinal and hilar lymph nodes.  The subcarinal lymph node measures 1.1 cm, image 29.  Improved from 1.5 cm.  Left paratracheal lymph node measures 0.8 cm, image 20.  Previously 1.3 cm.  Right hilar lymph node measures 0.8 cm, image number 33. Previously 1.5 cm.  Previous median sternotomy and CABG procedure.  Pulmonary parenchymal nodule in the left upper lobe measures 5.9 mm, image 23.  Unchanged from 08/27/2011 but new from 05/23/2007.  Stable, noncalcified nodule in the right base measures 4.6 mm, image 43. Bilateral lower lobe calcified granulomas noted.  Review of the visualized osseous structures is significant for multilevel thoracic spondylosis.  Several  lucent lesions are identified within the lumbar spine.  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  Limited imaging through the upper abdomen is significant for fatty infiltration of the liver.  IMPRESSION:  1.  No acute cardiopulmonary abnormalities. 2.  Interval improvement in mediastinal and hilar adenopathy. 3.  Calcified and noncalcified nodules are likely related to prior granulomatous disease.  Within the left upper lobe there is a 5.9 cm noncalcified nodule which is unchanged from 08/27/2011.  Given the given the previous smoking history I would advise follow-up imaging to confirm stability of this and other noncalcified nodules. The next follow-up exam should be obtained at 18-24 months.  Original Report Authenticated By: Rosealee Albee, M.D.   Dg Chest Port 1 View  03/02/2012  *RADIOLOGY REPORT*  Clinical Data: Rule out infiltrate  PORTABLE CHEST - 1 VIEW  Comparison: 03/01/2012  Findings: Previous median sternotomy and CABG procedure.  Heart size appears enlarged.  No pleural effusion or edema.  No airspace consolidation.  IMPRESSION:  1.  No acute cardiopulmonary abnormalities.  Original Report Authenticated By: Rosealee Albee, M.D.   EKG: atrial fibrillation + RVR 126 bpm, incomplete RBBB, no ST-T wave changes  ASSESSMENT:   1. Chest pain 2. CAD 3. Likely acute on chronic diastolic CHF  4. Permanent atrial fibrillation + RVR 5. Aortic stenosis 6. Hepatic cirrhosis 7. Ascites 7. History of GIB 2/2 AVMs 8. GERD 9. Type 2 DM  DISCUSSION/PLAN:   This gentleman has a significant history of CAD, and most recent ischemic work-up was a little over a year ago. Briefly, in 12/2010, he reported increased dyspnea. This was believed to be a manifestation of cardiac ischemia. He subsequently had a normal Myoview and unremarkable cardiac catheterization. Earlier this year, he reported worsening shortness of breath associated with ascites requiring  paracentesis with apparently 4 L  removed (per patient) and a considerable improvement in his dyspnea.   He presents with new onset of chest pain beginning this morning with both typical and atypical features. There have been no prior episodes leading up to today. He denies association with exertion. No other associated symptoms. He states this is a dull pain, but does not relate it to his prior angina warranting the initial cardiac catheterization. He states it was not relieved with NTG tabs, but did improve with NTG gtt. He also received a GI cocktail and morphine around the time the drip was started in the ED, with apparently no alleviation.   On exam, his chest pain is localized in the inferior substernum/epigastric region. His abdomen is quite distended and liver borders are palpable. There is trace LE edema. JVD is difficult to appreciate given body habitus. Lungs clear. Cardiac murmur consistent with at least moderate AS.   There is a component of fluid overload which is likely multifactorial due to acute on chronic diastolic CHF (? A-fib induced- mildly elevated VR in ED) and ascites from cirrhosis. He does endorse two separate types of discomfort- an abdominal fullness and lower chest/epigastric discomfort- and wonder if this is progression of ascites. Will admit for diuresis and further management. He notes he was as distended today as earlier this year when paracentesis needed to be performed for symptom improvement. May eventually need GI consult. Will put in order for paracentesis now after speaking with MD. Would favor diuresis initially to monitor output and if ascites improves with this.  Regarding chest pain, unclear whether this is a cardiac or GI cause. He has a fairly recent and normal ischemic work-up. Continue cycling cardiac biomarkers. Continue outpatient medications. Will start with Lasix 60 mg IV BID with K supplemntation and monitor response. Patient takes Lasix 80mg  PO BID at home. Will give home verapamil for  rate-control (Dr. Earnestine Leys has noted controlled VR on this) as his rates are mildly elevated. Of note, patient has an intolerance to diltiazem. Switch to SSI while inpatient. He does have a murmur consistent with at least moderate AS on exam (qualified as mild AS on 2006 echo, likely progressed), and given suspicion of A/C CHF, and will need 2D echo this admission once comfortable to lay flat. Check pBNP and D-dimer. Heart healthy diet, daily weights and strict I/Os. Will need to discuss this anticoagulation issue with MD while inpatient and going forward. Favor DVT prophylaxis anticoagulation vs SCDs initially while other issues resolve as GIB would further complicate and prolong the patient's admission. This may be readdressed near discharge once improved.  Will need coag studies prior to this given cirrhosis. On discussing with MD, ASA and Plavix may be an option.   Signed, R. Hurman Horn, PA-C 03/02/2012, 12:27 PM   Patient seen and examined.  Agree with findings of R Arguello  The patient presents today with Chest Pain that began last night.  Has not gone away.  ALso c/o increased dyspnea as well as abdominal swelling. ON exam, neck is full; Lungs are relatively clear; Cardiac exam: Irreg rate and rhythm.  No S3  Gr II/Vi systolic murmur Abdomen is distended and fairly tense.  Ext with 1+ edema  EKG shows afib with occasional PVCs. Labs signif for normal troponin; Hgb 11.7, normal Cr.  Imp: Some of patient's symptoms may be due to volume overload.  I agree with plans for paracentesis.  He has had before.  With this he  will hopefully get to lay flat.  He cannot now.  I also agree with plans for diuresis. With CAD history it is concerning for ischemia but I would defer workup until above accomplished.  2.  CHF  Agree with echo once comfortable to evaluate LV function as well as AV  3.  Afib.  I would resume po verapamil  Follow on telemetry.  With signif GI history I do not think patient a  good candidate for coumadin.  Could consider ASA and Plavix.  4.  HTN  Follow.  5.  Ascites   Send fluid for analysis.  6.  Adenopathy.  Had CT yesterday.  Nodes improved.  Stable lung nodules.

## 2012-03-02 NOTE — ED Notes (Signed)
Pt c/o mid sternal CP with SOB and nausea starting at 0300; pt sts had CT scan yesterday and accidentally took his metformin afterwards

## 2012-03-03 ENCOUNTER — Encounter (HOSPITAL_COMMUNITY): Payer: Self-pay | Admitting: *Deleted

## 2012-03-03 LAB — BASIC METABOLIC PANEL
BUN: 14 mg/dL (ref 6–23)
Chloride: 98 mEq/L (ref 96–112)
Creatinine, Ser: 0.71 mg/dL (ref 0.50–1.35)
Glucose, Bld: 270 mg/dL — ABNORMAL HIGH (ref 70–99)
Potassium: 4.2 mEq/L (ref 3.5–5.1)

## 2012-03-03 LAB — OCCULT BLOOD X 1 CARD TO LAB, STOOL: Fecal Occult Bld: POSITIVE

## 2012-03-03 LAB — CBC
HCT: 34.9 % — ABNORMAL LOW (ref 39.0–52.0)
Hemoglobin: 11.5 g/dL — ABNORMAL LOW (ref 13.0–17.0)
MCH: 28.4 pg (ref 26.0–34.0)
MCHC: 33 g/dL (ref 30.0–36.0)
MCV: 86.2 fL (ref 78.0–100.0)
RDW: 15.9 % — ABNORMAL HIGH (ref 11.5–15.5)

## 2012-03-03 LAB — GLUCOSE, CAPILLARY

## 2012-03-03 LAB — CARDIAC PANEL(CRET KIN+CKTOT+MB+TROPI)
CK, MB: 1.7 ng/mL (ref 0.3–4.0)
Relative Index: INVALID (ref 0.0–2.5)
Relative Index: INVALID (ref 0.0–2.5)
Total CK: 40 U/L (ref 7–232)

## 2012-03-03 LAB — TSH: TSH: 1.61 u[IU]/mL (ref 0.350–4.500)

## 2012-03-03 NOTE — Progress Notes (Signed)
SUBJECTIVE:  Breathing much better. No further chest pain.     PHYSICAL EXAM Filed Vitals:   03/03/12 0300 03/03/12 0316 03/03/12 0500 03/03/12 0750  BP: 125/62     Pulse:      Temp:  98 F (36.7 C)  98.2 F (36.8 C)  TempSrc:  Oral  Oral  Resp: 18   18  Height:      Weight:   264 lb 12.4 oz (120.1 kg)   SpO2: 96%      General:  No distress HEENT:  PERRL Lungs:  Clear Heart:  Irregular Abdomen:  Distended.  Nontender Extremities:  No edema  LABS: Lab Results  Component Value Date   CKTOTAL 43 03/03/2012   CKMB 1.2 03/03/2012   TROPONINI <0.30 03/03/2012   Results for orders placed during the hospital encounter of 03/02/12 (from the past 24 hour(s))  CARDIAC PANEL(CRET KIN+CKTOT+MB+TROPI)     Status: Normal   Collection Time   03/02/12  3:31 PM      Component Value Range   Total CK 40  7 - 232 U/L   CK, MB 1.8  0.3 - 4.0 ng/mL   Troponin I <0.30  <0.30 ng/mL   Relative Index RELATIVE INDEX IS INVALID  0.0 - 2.5  TSH     Status: Normal   Collection Time   03/02/12  3:31 PM      Component Value Range   TSH 1.610  0.350 - 4.500 uIU/mL  COMPREHENSIVE METABOLIC PANEL     Status: Abnormal   Collection Time   03/02/12  3:31 PM      Component Value Range   Sodium 137  135 - 145 mEq/L   Potassium 4.3  3.5 - 5.1 mEq/L   Chloride 102  96 - 112 mEq/L   CO2 24  19 - 32 mEq/L   Glucose, Bld 290 (*) 70 - 99 mg/dL   BUN 14  6 - 23 mg/dL   Creatinine, Ser 4.09  0.50 - 1.35 mg/dL   Calcium 9.5  8.4 - 81.1 mg/dL   Total Protein 7.2  6.0 - 8.3 g/dL   Albumin 3.8  3.5 - 5.2 g/dL   AST 35  0 - 37 U/L   ALT 49  0 - 53 U/L   Alkaline Phosphatase 75  39 - 117 U/L   Total Bilirubin 0.5  0.3 - 1.2 mg/dL   GFR calc non Af Amer >90  >90 mL/min   GFR calc Af Amer >90  >90 mL/min  APTT     Status: Normal   Collection Time   03/02/12  3:31 PM      Component Value Range   aPTT 32  24 - 37 seconds  PROTIME-INR     Status: Normal   Collection Time   03/02/12  3:31 PM      Component Value  Range   Prothrombin Time 14.0  11.6 - 15.2 seconds   INR 1.06  0.00 - 1.49  PRO B NATRIURETIC PEPTIDE     Status: Abnormal   Collection Time   03/02/12  3:31 PM      Component Value Range   Pro B Natriuretic peptide (BNP) 804.8 (*) 0 - 125 pg/mL  D-DIMER, QUANTITATIVE     Status: Normal   Collection Time   03/02/12  3:31 PM      Component Value Range   D-Dimer, Quant 0.43  0.00 - 0.48 ug/mL-FEU  AMYLASE     Status:  Normal   Collection Time   03/02/12  5:34 PM      Component Value Range   Amylase 55  0 - 105 U/L  LIPASE, BLOOD     Status: Abnormal   Collection Time   03/02/12  5:34 PM      Component Value Range   Lipase 69 (*) 11 - 59 U/L  GLUCOSE, CAPILLARY     Status: Abnormal   Collection Time   03/02/12 11:12 PM      Component Value Range   Glucose-Capillary 309 (*) 70 - 99 mg/dL  CARDIAC PANEL(CRET KIN+CKTOT+MB+TROPI)     Status: Normal   Collection Time   03/02/12 11:36 PM      Component Value Range   Total CK 40  7 - 232 U/L   CK, MB 1.7  0.3 - 4.0 ng/mL   Troponin I <0.30  <0.30 ng/mL   Relative Index RELATIVE INDEX IS INVALID  0.0 - 2.5  BASIC METABOLIC PANEL     Status: Abnormal   Collection Time   03/03/12  5:27 AM      Component Value Range   Sodium 136  135 - 145 mEq/L   Potassium 4.2  3.5 - 5.1 mEq/L   Chloride 98  96 - 112 mEq/L   CO2 27  19 - 32 mEq/L   Glucose, Bld 270 (*) 70 - 99 mg/dL   BUN 14  6 - 23 mg/dL   Creatinine, Ser 9.52  0.50 - 1.35 mg/dL   Calcium 9.2  8.4 - 84.1 mg/dL   GFR calc non Af Amer >90  >90 mL/min   GFR calc Af Amer >90  >90 mL/min  CBC     Status: Abnormal   Collection Time   03/03/12  5:27 AM      Component Value Range   WBC 5.9  4.0 - 10.5 K/uL   RBC 4.05 (*) 4.22 - 5.81 MIL/uL   Hemoglobin 11.5 (*) 13.0 - 17.0 g/dL   HCT 32.4 (*) 40.1 - 02.7 %   MCV 86.2  78.0 - 100.0 fL   MCH 28.4  26.0 - 34.0 pg   MCHC 33.0  30.0 - 36.0 g/dL   RDW 25.3 (*) 66.4 - 40.3 %   Platelets 95 (*) 150 - 400 K/uL  GLUCOSE, CAPILLARY     Status:  Abnormal   Collection Time   03/03/12  7:49 AM      Component Value Range   Glucose-Capillary 223 (*) 70 - 99 mg/dL   Comment 1 Notify RN    CARDIAC PANEL(CRET KIN+CKTOT+MB+TROPI)     Status: Normal   Collection Time   03/03/12  7:58 AM      Component Value Range   Total CK 43  7 - 232 U/L   CK, MB 1.2  0.3 - 4.0 ng/mL   Troponin I <0.30  <0.30 ng/mL   Relative Index RELATIVE INDEX IS INVALID  0.0 - 2.5    Intake/Output Summary (Last 24 hours) at 03/03/12 1027 Last data filed at 03/03/12 0700  Gross per 24 hour  Intake 900.83 ml  Output   2200 ml  Net -1299.17 ml    ASSESSMENT AND PLAN:  1. Chest pain.  No objective evidence for ischemia.  Ruled out.  Treated with GI cocktail didn't help.  However, pain improved possibly with diuresis.  No cath planned.  I will stop NTG.   2. CHF   Echo pending to evaluate LV function as  well as AV.  Good UO.  Continue IV Lasix.  Continue IV Lasix today.  3. Afib. Resumed po verapamil Follow on telemetry. With signif GI history I do not think patient a good candidate for coumadin. Could consider ASA and Plavix.   4. HTN.  BP ok on current meds.  5. Ascites (history of):  No ascites yesterday on the abd ultrasound. No paracentesis  6. Adenopathy. Had CT this admission. Nodes improved. Stable lung nodules.  7. Diabetes.  Continue PO meds and SSI   Rollene Rotunda 03/03/2012 10:27 AM

## 2012-03-04 LAB — GLUCOSE, CAPILLARY
Glucose-Capillary: 212 mg/dL — ABNORMAL HIGH (ref 70–99)
Glucose-Capillary: 260 mg/dL — ABNORMAL HIGH (ref 70–99)

## 2012-03-04 LAB — CBC
HCT: 37.5 % — ABNORMAL LOW (ref 39.0–52.0)
MCH: 27.8 pg (ref 26.0–34.0)
MCV: 86.2 fL (ref 78.0–100.0)
RBC: 4.35 MIL/uL (ref 4.22–5.81)
WBC: 5.9 10*3/uL (ref 4.0–10.5)

## 2012-03-04 LAB — BASIC METABOLIC PANEL
CO2: 27 mEq/L (ref 19–32)
Chloride: 97 mEq/L (ref 96–112)
Creatinine, Ser: 0.78 mg/dL (ref 0.50–1.35)
Glucose, Bld: 234 mg/dL — ABNORMAL HIGH (ref 70–99)
Sodium: 136 mEq/L (ref 135–145)

## 2012-03-04 MED ORDER — METOPROLOL TARTRATE 50 MG PO TABS
75.0000 mg | ORAL_TABLET | Freq: Two times a day (BID) | ORAL | Status: DC
  Administered 2012-03-04 – 2012-03-10 (×12): 75 mg via ORAL
  Filled 2012-03-04 (×14): qty 1

## 2012-03-04 MED ORDER — FUROSEMIDE 10 MG/ML IJ SOLN
INTRAMUSCULAR | Status: AC
  Administered 2012-03-04: 60 mg via INTRAVENOUS
  Filled 2012-03-04: qty 8

## 2012-03-04 NOTE — Progress Notes (Signed)
Pt noted to have increased HR frequently up to 155 non-sustained.  Pt denies any sob, palpitation, or CP.  Pt due for verapamil and metoprolol at 2200.  MD notified.  OK to give medicine early per MD.  Will continue to monitor.

## 2012-03-04 NOTE — Progress Notes (Signed)
Patient ID: [name], male   DOB: [birth year], 66 y.o.   MRN: [id number] Subjective:  No chest pain or sob  Objective:  Vital Signs in the last 24 hours: Temp:  [98.1 F (36.7 C)-98.9 F (37.2 C)] 98.9 F (37.2 C) (08/11 0500) Pulse Rate:  [64-86] 86  (08/11 0500) Resp:  [18] 18  (08/11 0500) BP: (101-126)/(61-77) 126/77 mmHg (08/11 0500) SpO2:  [96 %-98 %] 96 % (08/11 0500) Weight:  [264 lb 8.8 oz (120 kg)] 264 lb 8.8 oz (120 kg) (08/11 0500)  Intake/Output from previous day: 08/10 0701 - 08/11 0700 In: 840 [P.O.:840] Out: 2500 [Urine:2500] Intake/Output from this shift: Total I/O In: -  Out: 250 [Urine:250]  Physical Exam: Well appearing NAD HEENT: Unremarkable Neck:  No JVD, no thyromegally Lungs:  Clear with minimal basilar rales HEART:  Regular rate rhythm, 2/6 systolic murmur, no rubs, no clicks Abd:  soft, positive bowel sounds, no organomegally, no rebound, no guarding Ext:  2 plus pulses, no edema, no cyanosis, no clubbing Skin:  No rashes no nodules Neuro:  CN II through XII intact, motor grossly intact  Lab Results:  Basename 03/04/12 0630 03/03/12 0527  WBC 5.9 5.9  HGB 12.1* 11.5*  PLT 93* 95*    Basename 03/04/12 0630 03/03/12 0527  NA 136 136  K 3.9 4.2  CL 97 98  CO2 27 27  GLUCOSE 234* 270*  BUN 19 14  CREATININE 0.78 0.71    Basename 03/03/12 0758 03/02/12 2336  TROPONINI <0.30 <0.30   Hepatic Function Panel  Basename 03/02/12 1531  PROT 7.2  ALBUMIN 3.8  AST 35  ALT 49  ALKPHOS 75  BILITOT 0.5  BILIDIR --  IBILI --   No results found for this basename: CHOL in the last 72 hours No results found for this basename: PROTIME in the last 72 hours  Imaging: US Abdomen Limited  03/02/2012  *RADIOLOGY REPORT*  Clinical Data: Ascites.  Shortness of breath.  LIMITED ABDOMINAL ULTRASOUND  Comparison:  09/30/2011  Findings: Four-quadrant survey of the abdomen demonstrates no ascites.  IMPRESSION:  Exam negative for ascites.  *RADIOLOGY REPORT*  LIMITED ABDOMINAL ULTRASOUND  Ultrasound guided paracentesis was requested for this patient. Ultrasound imaging was performed over all four quadrants of the abdomen without evidence of ascites.  Image documentation was obtained of all four quadrants.  No paracentesis performed at this time.  Impression:  Limited abdominal ultrasound sound revealing no evidence of ascites for paracentesis.  Read by: Anselm Pancoast, P.A.-C  Original Report Authenticated By: Waynard Reeds, M.D.   Dg Chest Port 1 View  03/02/2012  *RADIOLOGY REPORT*  Clinical Data: Rule out infiltrate  PORTABLE CHEST - 1 VIEW  Comparison: 03/01/2012  Findings: Previous median sternotomy and CABG procedure.  Heart size appears enlarged.  No pleural effusion or edema.  No airspace consolidation.  IMPRESSION:  1.  No acute cardiopulmonary abnormalities.  Original Report Authenticated By: Rosealee Albee, M.D.    Cardiac Studies: Tele - atrial fib with a CVR/RVR Assessment/Plan:  1. Acute CHF - await 2D echo, continue IV lasix, ACE inhibitor, and metoprolol 2. Atrial fib - his rate is up a bit. He is on Digoxin and a beta blocker and a calcium channel blocker. Not thought to be a candidate for coumadin. Will follow rates and consider uptitration of meds as rate requires.  LOS: 2 days    Buel Ream.D. 03/04/2012, 9:19 AM

## 2012-03-04 NOTE — Progress Notes (Addendum)
Pt noted to have pauses on monitor after PM dose verapamil given.  Multiple pauses 1.0-2.85 seconds in length.  MD notified.  Pt. HR 58 BP 113/62 Pt denies any CP, SOB or palpitations.  MD notified.  Will continue to monitor

## 2012-03-05 DIAGNOSIS — I359 Nonrheumatic aortic valve disorder, unspecified: Secondary | ICD-10-CM

## 2012-03-05 DIAGNOSIS — I4891 Unspecified atrial fibrillation: Secondary | ICD-10-CM

## 2012-03-05 LAB — BASIC METABOLIC PANEL
BUN: 25 mg/dL — ABNORMAL HIGH (ref 6–23)
Calcium: 9.3 mg/dL (ref 8.4–10.5)
Creatinine, Ser: 0.9 mg/dL (ref 0.50–1.35)
GFR calc non Af Amer: 89 mL/min — ABNORMAL LOW (ref >90)
Glucose, Bld: 235 mg/dL — ABNORMAL HIGH (ref 70–99)

## 2012-03-05 LAB — CBC
MCH: 28.1 pg (ref 26.0–34.0)
MCHC: 32.7 g/dL (ref 30.0–36.0)
Platelets: 105 10*3/uL — ABNORMAL LOW (ref 150–400)
RDW: 15.4 % (ref 11.5–15.5)

## 2012-03-05 LAB — GLUCOSE, CAPILLARY: Glucose-Capillary: 249 mg/dL — ABNORMAL HIGH (ref 70–99)

## 2012-03-05 NOTE — Progress Notes (Signed)
Patient ID: [name], male   DOB: 07/05/1946, 66 y.o.   MRN: [id number]  Subjective:  No chest pain or sob  Objective:  Vital Signs in the last 24 hours: Temp:  [98.2 F (36.8 C)-98.5 F (36.9 C)] 98.5 F (36.9 C) (08/12 0500) Pulse Rate:  [72-93] 90  (08/12 0953) Resp:  [18-20] 18  (08/12 0500) BP: (122-142)/(72-78) 142/72 mmHg (08/12 0953) SpO2:  [98 %] 98 % (08/12 0500) Weight:  [258 lb 11.2 oz (117.346 kg)] 258 lb 11.2 oz (117.346 kg) (08/12 0500)  Intake/Output from previous day: 08/11 0701 - 08/12 0700 In: 480 [P.O.:480] Out: 1675 [Urine:1675] Intake/Output from this shift: Total I/O In: -  Out: 1125 [Urine:1125]  Physical Exam: Well appearing man, NAD HEENT: Unremarkable Neck:  7 cm JVD, no thyromegally Lungs:  Clear with minimal basilar rales HEART:  IRegular rate rhythm, 2/6 systolic murmur, no rubs, no clicks Abd:  soft, positive bowel sounds, no organomegally, no rebound, no guarding Ext:  2 plus pulses, no edema, no cyanosis, no clubbing Skin:  No rashes no nodules Neuro:  CN II through XII intact, motor grossly intact  Lab Results:  Basename 03/05/12 0638 03/04/12 0630  WBC 5.0 5.9  HGB 12.6* 12.1*  PLT 105* 93*    Basename 03/05/12 0638 03/04/12 0630  NA 135 136  K 4.2 3.9  CL 98 97  CO2 24 27  GLUCOSE 235* 234*  BUN 25* 19  CREATININE 0.90 0.78    Basename 03/03/12 0758 03/02/12 2336  TROPONINI <0.30 <0.30   Hepatic Function Panel  Basename 03/02/12 1531  PROT 7.2  ALBUMIN 3.8  AST 35  ALT 49  ALKPHOS 75  BILITOT 0.5  BILIDIR --  IBILI --   No results found for this basename: CHOL in the last 72 hours No results found for this basename: PROTIME in the last 72 hours  Imaging: No results found.  Cardiac Studies: Tele - atrial fib with a CVR/RVR Assessment/Plan:  1. Acute CHF - await 2D echo, continue IV lasix, ACE inhibitor, and metoprolol 2. Atrial fib - his rate is a bit better. He is on Digoxin and a beta blocker and  a calcium channel blocker. Not thought to be a candidate for coumadin. Will follow rates and consider uptitration of meds as rate requires. Anticipate discharge in the next 24 hours.  LOS: 3 days    Buel Ream.D. 03/05/2012, 10:35 AM

## 2012-03-05 NOTE — Progress Notes (Addendum)
Inpatient Diabetes Program Recommendations  AACE/ADA: New Consensus Statement on Inpatient Glycemic Control (2013)  Target Ranges:  Prepandial:   less than 140 mg/dL      Peak postprandial:   less than 180 mg/dL (1-2 hours)      Critically ill patients:  140 - 180 mg/dL   Reason for Visit: Fasting  And post-prandial hyperglycemia  Inpatient Diabetes Program Recommendations Insulin - Basal: Please add Lantus while here.  Start with 15 units daily or HS Please check HgbA1C. Last documented is from 2012.  Note: Thank you, Lenor Coffin, RN, CNS, Diabetes Coordinator [phone])

## 2012-03-05 NOTE — Progress Notes (Signed)
  Echocardiogram 2D Echocardiogram has been performed.  [name] 03/05/2012, 9:42 AM

## 2012-03-05 NOTE — Progress Notes (Signed)
Pt CBG is 317 mg, paged physician on call. Pt is comfortable and VS stables.

## 2012-03-06 LAB — GLUCOSE, CAPILLARY
Glucose-Capillary: 235 mg/dL — ABNORMAL HIGH (ref 70–99)
Glucose-Capillary: 298 mg/dL — ABNORMAL HIGH (ref 70–99)
Glucose-Capillary: 247 mg/dL — ABNORMAL HIGH (ref 70–99)
Glucose-Capillary: 314 mg/dL — ABNORMAL HIGH (ref 70–99)

## 2012-03-06 LAB — CBC
HCT: 37.6 % — ABNORMAL LOW (ref 39.0–52.0)
RDW: 15.2 % (ref 11.5–15.5)
WBC: 5 10*3/uL (ref 4.0–10.5)

## 2012-03-06 MED ORDER — ASPIRIN 81 MG PO CHEW
324.0000 mg | CHEWABLE_TABLET | ORAL | Status: AC
  Administered 2012-03-07: 324 mg via ORAL
  Filled 2012-03-06: qty 4

## 2012-03-06 MED ORDER — SODIUM CHLORIDE 0.9 % IV SOLN
INTRAVENOUS | Status: DC
  Administered 2012-03-07: 06:00:00 via INTRAVENOUS

## 2012-03-06 MED ORDER — FUROSEMIDE 10 MG/ML IJ SOLN
60.0000 mg | Freq: Two times a day (BID) | INTRAMUSCULAR | Status: AC
  Administered 2012-03-06: 60 mg via INTRAVENOUS

## 2012-03-06 NOTE — Progress Notes (Signed)
Patient: [name] Date of Encounter: 03/06/2012, 10:31 AM Admit date: 03/02/2012     Subjective  No chest pain. Note results of echo.   Objective  Physical Exam: Vitals: BP 106/69  Pulse 57  Temp 98.1 F (36.7 C) (Oral)  Resp 18  Ht 5\' 8"  (1.727 m)  Wt 255 lb 11.2 oz (115.985 kg)  BMI 38.88 kg/m2  SpO2 98% General: Well developed, well appearing, in no acute distress. Neck: Supple. JVD not elevated. Lungs: Clear bilaterally to auscultation without wheezes, rales, or rhonchi. Breathing is unlabored. Heart: RRR S1 S2 without murmurs, rubs, or gallops.  Abdomen: Soft, non-distended. Extremities: No clubbing or cyanosis. No edema.  Distal pedal pulses are 2+ and equal bilaterally. Neuro: Alert and oriented X 3. Moves all extremities spontaneously. No focal deficits.  Intake/Output: Intake/Output Summary (Last 24 hours) at 03/06/12 1031 Last data filed at 03/06/12 1012  Gross per 24 hour  Intake    480 ml  Output   2975 ml  Net  -2495 ml   Inpatient Medications:  . aspirin EC  325 mg Oral Daily  . atorvastatin  20 mg Oral QHS  . digoxin  250 mcg Oral Daily  . ferrous gluconate  324 mg Oral TID WC  . furosemide  60 mg Intravenous BID  . glimepiride  4 mg Oral Q supper  . insulin aspart  0-15 Units Subcutaneous TID WC  . isosorbide mononitrate  30 mg Oral Daily  . linagliptin  5 mg Oral Daily  . lisinopril  10 mg Oral Daily  . metoprolol  75 mg Oral BID  . pantoprazole  40 mg Oral BID AC  . potassium chloride  30 mEq Oral BID  . verapamil  80 mg Oral TID   Labs:  [facility] 03/05/12 0638 03/04/12 0630  NA 135 136  K 4.2 3.9  CL 98 97  CO2 24 27  GLUCOSE 235* 234*  BUN 25* 19  CREATININE 0.90 0.78  CALCIUM 9.3 9.3  MG -- --  PHOS -- --    Basename 03/06/12 0535 03/05/12 0638  WBC 5.0 5.0  NEUTROABS -- --  HGB 12.2* 12.6*  HCT 37.6* 38.5*  MCV 86.2 85.7  PLT 119* 105*   No results found for this basename: CKTOTAL:4,CKMB:4,TROPONINI:4 in the  last 72 hours No components found with this basename: POCBNP:3   Radiology/Studies: US Abdomen Limited  03/02/2012  *RADIOLOGY REPORT*  Clinical Data: Ascites.  Shortness of breath.  LIMITED ABDOMINAL ULTRASOUND  Comparison:  09/30/2011  Findings: Four-quadrant survey of the abdomen demonstrates no ascites.  IMPRESSION:  Exam negative for ascites. *RADIOLOGY REPORT*  LIMITED ABDOMINAL ULTRASOUND  Ultrasound guided paracentesis was requested for this patient. Ultrasound imaging was performed over all four quadrants of the abdomen without evidence of ascites.  Image documentation was obtained of all four quadrants.  No paracentesis performed at this time.  Impression:  Limited abdominal ultrasound sound revealing no evidence of ascites for paracentesis.  Read by: Anselm Pancoast, P.A.-C  Original Report Authenticated By: Waynard Reeds, M.D.   Dg Chest Port 1 View  03/02/2012  *RADIOLOGY REPORT*  Clinical Data: Rule out infiltrate  PORTABLE CHEST - 1 VIEW  Comparison: 03/01/2012  Findings: Previous median sternotomy and CABG procedure.  Heart size appears enlarged.  No pleural effusion or edema.  No airspace consolidation.  IMPRESSION:  1.  No acute cardiopulmonary abnormalities.  Original Report Authenticated By: Rosealee Albee, M.D.   Echocardiogram:  Conclusions: Left ventricle: The cavity size was normal. Wall thickness was normal. Systolic function was vigorous. The estimated ejection fraction was in the range of 65% to 70%. - Aortic valve: AV is thickened, calcified with restricted motion. Peak and mean gradients through the valve are 76 and 45 mm Hg respectively, consistent with severe AS. Consider TEE to define further as difficult to see well. Trivial regurgitation. - Mitral valve: Mild regurgitation. - Normal atria sizes.  Telemetry: rate controlled atrial fibrillation   Assessment and Plan  1. Severe AS - by TTE yesterday 2. Acute CHF 3. Atrial fibrillation 4. CAD 5. History of GI  bleed 6. Hepatic cirrhosis  Dr. Ladona Ridgel to see and make further recommendations. Signed, Exie Parody  Cardiology Attending  Patient seen and examined. Echo reviewed. Discussed findings with the patient. Will plan TEE and right and left heart cath, hopefully both tomorrow. He could be discharged after and arrange for outpatient surgical consultation.  Lewayne Bunting, M.D.

## 2012-03-06 NOTE — Progress Notes (Signed)
Called to write R&L heart cath orders on patient. Chart reviewed. On for Dr. Excell Seltzer tomorrow at 1pm. Per Dr. Ladona Ridgel, will hold Lasix tomorrow in prep for cath. This is ordered through tonight in EPIC. If patient stays or needs to be diuresed after cath, please note Lasix will need to be reordered. Marise Knapper PA-C

## 2012-03-06 NOTE — Progress Notes (Signed)
Inpatient Diabetes Program Recommendations  AACE/ADA: New Consensus Statement on Inpatient Glycemic Control (2013)  Target Ranges:  Prepandial:   less than 140 mg/dL      Peak postprandial:   less than 180 mg/dL (1-2 hours)      Critically ill patients:  140 - 180 mg/dL    Results for CASPER, PAGLIUCA (MRN [id number]) as of 03/06/2012 12:33  Ref. Range 03/05/2012 07:31 03/05/2012 11:52 03/05/2012 16:48 03/05/2012 20:31  Glucose-Capillary Latest Range: 70-99 mg/dL 295 (H) 621 (H) 308 (H) 317 (H)    Results for ELIZAR, ALPERN (MRN [id number]) as of 03/06/2012 12:33  Ref. Range 03/06/2012 07:20 03/06/2012 11:14  Glucose-Capillary Latest Range: 70-99 mg/dL 952 (H) 841 (H)    Patient admitted with CP.  Takes 3 oral diabetes medications at home.  Last A1c on file was 4.8% (05/27/2011).  Inpatient Diabetes Program Recommendations Insulin - Basal: Please consider starting Lantus for patient while in hospital- Lantus 15 units QHS HgbA1C: Please check A1c before patient is dicharged- Last A1c on record was from November 2012  Note: Will follow. Ambrose Finland RN, MSN, CDE Diabetes Coordinator Inpatient Diabetes Program [phone]

## 2012-03-07 ENCOUNTER — Encounter (HOSPITAL_COMMUNITY): Payer: Self-pay

## 2012-03-07 ENCOUNTER — Encounter (HOSPITAL_COMMUNITY)
Admission: EM | Disposition: A | Discharge status: Home / Self Care | Payer: Self-pay | Source: Home / Self Care | Attending: Internal Medicine

## 2012-03-07 DIAGNOSIS — I359 Nonrheumatic aortic valve disorder, unspecified: Secondary | ICD-10-CM

## 2012-03-07 DIAGNOSIS — I2510 Atherosclerotic heart disease of native coronary artery without angina pectoris: Secondary | ICD-10-CM

## 2012-03-07 HISTORY — PX: LEFT AND RIGHT HEART CATHETERIZATION WITH CORONARY/GRAFT ANGIOGRAM: SHX5448

## 2012-03-07 HISTORY — PX: TEE WITHOUT CARDIOVERSION: SHX5443

## 2012-03-07 LAB — CBC
MCH: 28.3 pg (ref 26.0–34.0)
MCHC: 33.4 g/dL (ref 30.0–36.0)
MCV: 84.7 fL (ref 78.0–100.0)
Platelets: 127 10*3/uL — ABNORMAL LOW (ref 150–400)
RDW: 14.9 % (ref 11.5–15.5)

## 2012-03-07 LAB — POCT I-STAT 3, VENOUS BLOOD GAS (G3P V)
Bicarbonate: 27.5 mEq/L — ABNORMAL HIGH (ref 20.0–24.0)
O2 Saturation: 63 %
TCO2: 29 mmol/L (ref 0–100)
pCO2, Ven: 46.4 mmHg (ref 45.0–50.0)
pO2, Ven: 34 mmHg (ref 30.0–45.0)

## 2012-03-07 LAB — POCT I-STAT 3, ART BLOOD GAS (G3+)
Acid-Base Excess: 2 mmol/L (ref 0.0–2.0)
Bicarbonate: 26.5 mEq/L — ABNORMAL HIGH (ref 20.0–24.0)
O2 Saturation: 97 %
TCO2: 28 mmol/L (ref 0–100)
pH, Arterial: 7.411 (ref 7.350–7.450)

## 2012-03-07 LAB — GLUCOSE, CAPILLARY

## 2012-03-07 SURGERY — ECHOCARDIOGRAM, TRANSESOPHAGEAL
Anesthesia: Moderate Sedation

## 2012-03-07 SURGERY — LEFT AND RIGHT HEART CATHETERIZATION WITH CORONARY/GRAFT ANGIOGRAM
Anesthesia: LOCAL

## 2012-03-07 MED ORDER — LIDOCAINE HCL (PF) 1 % IJ SOLN
INTRAMUSCULAR | Status: AC
  Filled 2012-03-07: qty 30

## 2012-03-07 MED ORDER — HEPARIN (PORCINE) IN NACL 2-0.9 UNIT/ML-% IJ SOLN
INTRAMUSCULAR | Status: AC
  Filled 2012-03-07: qty 1000

## 2012-03-07 MED ORDER — FENTANYL CITRATE 0.05 MG/ML IJ SOLN
INTRAMUSCULAR | Status: AC
  Filled 2012-03-07: qty 4

## 2012-03-07 MED ORDER — NITROGLYCERIN 0.2 MG/ML ON CALL CATH LAB
INTRAVENOUS | Status: AC
  Filled 2012-03-07: qty 1

## 2012-03-07 MED ORDER — FENTANYL CITRATE 0.05 MG/ML IJ SOLN
INTRAMUSCULAR | Status: DC | PRN
  Administered 2012-03-07 (×3): 25 ug via INTRAVENOUS

## 2012-03-07 MED ORDER — MIDAZOLAM HCL 2 MG/2ML IJ SOLN
INTRAMUSCULAR | Status: AC
  Filled 2012-03-07: qty 2

## 2012-03-07 MED ORDER — FENTANYL CITRATE 0.05 MG/ML IJ SOLN
INTRAMUSCULAR | Status: AC
  Filled 2012-03-07: qty 2

## 2012-03-07 MED ORDER — ACETAMINOPHEN 325 MG PO TABS
650.0000 mg | ORAL_TABLET | ORAL | Status: DC | PRN
  Administered 2012-03-09: 650 mg via ORAL
  Filled 2012-03-07: qty 2

## 2012-03-07 MED ORDER — MIDAZOLAM HCL 5 MG/ML IJ SOLN
INTRAMUSCULAR | Status: AC
  Filled 2012-03-07: qty 2

## 2012-03-07 MED ORDER — BUTAMBEN-TETRACAINE-BENZOCAINE 2-2-14 % EX AERO
INHALATION_SPRAY | CUTANEOUS | Status: DC | PRN
  Administered 2012-03-07: 2 via TOPICAL

## 2012-03-07 MED ORDER — SODIUM CHLORIDE 0.9 % IV SOLN: INTRAVENOUS | Status: AC

## 2012-03-07 MED ORDER — SODIUM CHLORIDE 0.9 % IV SOLN: INTRAVENOUS | Status: DC

## 2012-03-07 MED ORDER — SODIUM CHLORIDE 0.9 % IJ SOLN: 3.0000 mL | INTRAMUSCULAR | Status: DC | PRN

## 2012-03-07 MED ORDER — ONDANSETRON HCL 4 MG/2ML IJ SOLN: 4.0000 mg | Freq: Four times a day (QID) | INTRAMUSCULAR | Status: DC | PRN

## 2012-03-07 MED ORDER — DIPHENHYDRAMINE HCL 50 MG/ML IJ SOLN
INTRAMUSCULAR | Status: AC
  Filled 2012-03-07: qty 1

## 2012-03-07 MED ORDER — MIDAZOLAM HCL 10 MG/2ML IJ SOLN
INTRAMUSCULAR | Status: DC | PRN
  Administered 2012-03-07: 1 mg via INTRAVENOUS
  Administered 2012-03-07: 2 mg via INTRAVENOUS
  Administered 2012-03-07: 1 mg via INTRAVENOUS

## 2012-03-07 MED ORDER — SODIUM CHLORIDE 0.9 % IV SOLN: 250.0000 mL | INTRAVENOUS | Status: DC

## 2012-03-07 MED ORDER — SODIUM CHLORIDE 0.9 % IJ SOLN
3.0000 mL | Freq: Two times a day (BID) | INTRAMUSCULAR | Status: DC
  Administered 2012-03-08 – 2012-03-09 (×4): 3 mL via INTRAVENOUS

## 2012-03-07 NOTE — Interval H&P Note (Signed)
History and Physical Interval Note:  03/07/2012 10:12 AM  [name]  has presented today for surgery, with the diagnosis of acess valve  The various methods of treatment have been discussed with the patient and family. After consideration of risks, benefits and other options for treatment, the patient has consented to  Procedure(s) (LRB): TRANSESOPHAGEAL ECHOCARDIOGRAM (TEE) (N/A) as a surgical intervention .  The patient's history has been reviewed, patient examined, no change in status, stable for surgery.  I have reviewed the patient's chart and labs.  Questions were answered to the patient's satisfaction.     Elyn Aquas.

## 2012-03-07 NOTE — Progress Notes (Signed)
Pt returned from cath, Groin level 0 pt i8n severe pain in his back 4mg  morphine administered. Noticed on his V/S HR is in 110-130's. PA with [place] paged. Will metoprolol early as ordered. Will continue to monitor

## 2012-03-07 NOTE — Interval H&P Note (Signed)
History and Physical Interval Note:  03/07/2012 4:26 PM  [name]  has presented today for surgery, with the diagnosis of cp/as  The various methods of treatment have been discussed with the patient and family. After consideration of risks, benefits and other options for treatment, the patient has consented to  Procedure(s) (LRB): LEFT AND RIGHT HEART CATHETERIZATION WITH CORONARY/GRAFT ANGIOGRAM (N/A) as a surgical intervention .  The patient's history has been reviewed, patient examined, no change in status, stable for surgery.  I have reviewed the patient's chart and labs.  Questions were answered to the patient's satisfaction.     Tonny Bollman

## 2012-03-07 NOTE — CV Procedure (Signed)
Cardiac Catheterization Procedure Note  Name: [name] MRN: [id number] DOB: 12-24-1945  Procedure: Right Heart Cath, Left Heart Cath, Selective Coronary Angiography, LV angiography  Indication: Severe aortic stenosis. This is a medically complicated 66 year old gentleman with CAD status post CABG. He presented with chest pain on this admission. His echocardiogram was suggestive of severe aortic stenosis. She underwent a transesophageal echo this morning as a confirmatory study and this was also suggestive of severe AS. He is now referred for right and left heart catheterization with coronary angiography. He also has a history of GI bleeding on Coumadin, permanent atrial fibrillation, and hepatic cirrhosis.  Procedural Details: The right groin was prepped, draped, and anesthetized with 1% lidocaine. Using the modified Seldinger technique a 5 French sheath was placed in the right femoral artery and a 7 French sheath was placed in the right femoral vein. A Swan-Ganz catheter was used for the right heart catheterization. Standard protocol was followed for recording of right heart pressures and sampling of oxygen saturations. Fick cardiac output was calculated. Standard Judkins catheters were used for selective coronary angiography and left ventriculography. The femoral sheath was upsized to a 6 French sheath so the Winding Cypress catheter can be used to obtain simultaneous LV and AO pressures. I was unable to cross the aortic valve with a pigtail catheter and J-tip wire. An AL-1 catheter and straight-tip wire were used and this was successful. The AL-1 catheter was changed out for pigtail over the exchange length straight tip wire. Simultaneous pressures were recorded. There were no immediate procedural complications. The patient was transferred to the post catheterization recovery area for further monitoring.  Procedural Findings: Hemodynamics RA mean of 5 RV 31/2 PA 28/18 with a mean of 22 PCWP  7 LV 156/10 AO 124/73 with a mean of 93  Oxygen saturations: PA 63 AO 97  Cardiac Output (Fick) 5.0  Cardiac Index (Fick) 2.2   Aortic valve hemodynamics: Mean gradient is 42, aortic valve area is 0.76  Coronary angiography: Coronary dominance: right  Left mainstem: Patent with no significant obstructive disease  Left anterior descending (LAD): Patent with 70% proximal stenosis. The mid LAD fills competitively from the native vessel, vein graft to diagonal, and LIMA to LAD.  Left circumflex (LCx): Widely patent through its stented segment in the mid circumflex. The first OM appears to be occluded. The second OM is patent. The AV groove circumflex courses down and supplies multiple small posterolateral branches with diffuse disease.  Right coronary artery (RCA): Dominant vessel. Diffuse plaquing but no high-grade obstructive disease is noted. The PDA is patent without significant stenosis. There are scattered 20-30% stenosis throughout the distribution of the RCA.  Free radial to obtuse marginal is widely patent throughout.  Saphenous vein graft to diagonal is widely patent. There is no obstructive disease noted. The diagonal fills antegrade in the LAD fills retrograde from graft flow.  LIMA to LAD is widely patent. There is brisk washout of the LAD from the multiple sources of blood flow.  Left ventriculography: LV function is vigorous. The estimated ejection fraction is 65-70%. The aortic valve shows moderate calcification.  Final Conclusions:   1. Moderate multivessel coronary artery disease as described 2. Status post multivessel coronary bypass surgery with continued patency of the LIMA to LAD, free radial to obtuse marginal, and saphenous vein graft to diagonal. 3. Vigorous LV systolic function 4. Severe aortic stenosis  Recommendations: Will request cardiac surgery consultation. I will carefully review all of his echo data as  well. He will obviously be high risk for  consideration of conventional aortic valve replacement considering his previous cardiac surgery, history of GI bleeding, and hepatic cirrhosis.  Tonny Bollman 03/07/2012, 5:11 PM

## 2012-03-07 NOTE — Progress Notes (Signed)
  Echocardiogram Echocardiogram Transesophageal has been performed.  [name] FRANCES 03/07/2012, 11:03 AM

## 2012-03-07 NOTE — H&P (View-Only) (Signed)
   Patient: [name] Date of Encounter: 03/06/2012, 10:31 AM Admit date: 03/02/2012     Subjective  No chest pain. Note results of echo.   Objective  Physical Exam: Vitals: BP 106/69  Pulse 57  Temp 98.1 F (36.7 C) (Oral)  Resp 18  Ht 5' 8" (1.727 m)  Wt 255 lb 11.2 oz (115.985 kg)  BMI 38.88 kg/m2  SpO2 98% General: Well developed, well appearing, in no acute distress. Neck: Supple. JVD not elevated. Lungs: Clear bilaterally to auscultation without wheezes, rales, or rhonchi. Breathing is unlabored. Heart: RRR S1 S2 without murmurs, rubs, or gallops.  Abdomen: Soft, non-distended. Extremities: No clubbing or cyanosis. No edema.  Distal pedal pulses are 2+ and equal bilaterally. Neuro: Alert and oriented X 3. Moves all extremities spontaneously. No focal deficits.  Intake/Output: Intake/Output Summary (Last 24 hours) at 03/06/12 1031 Last data filed at 03/06/12 1012  Gross per 24 hour  Intake    480 ml  Output   2975 ml  Net  -2495 ml   Inpatient Medications:  . aspirin EC  325 mg Oral Daily  . atorvastatin  20 mg Oral QHS  . digoxin  250 mcg Oral Daily  . ferrous gluconate  324 mg Oral TID WC  . furosemide  60 mg Intravenous BID  . glimepiride  4 mg Oral Q supper  . insulin aspart  0-15 Units Subcutaneous TID WC  . isosorbide mononitrate  30 mg Oral Daily  . linagliptin  5 mg Oral Daily  . lisinopril  10 mg Oral Daily  . metoprolol  75 mg Oral BID  . pantoprazole  40 mg Oral BID AC  . potassium chloride  30 mEq Oral BID  . verapamil  80 mg Oral TID   Labs:  Basename 03/05/12 0638 03/04/12 0630  NA 135 136  K 4.2 3.9  CL 98 97  CO2 24 27  GLUCOSE 235* 234*  BUN 25* 19  CREATININE 0.90 0.78  CALCIUM 9.3 9.3  MG -- --  PHOS -- --    Basename 03/06/12 0535 03/05/12 0638  WBC 5.0 5.0  NEUTROABS -- --  HGB 12.2* 12.6*  HCT 37.6* 38.5*  MCV 86.2 85.7  PLT 119* 105*   No results found for this basename: CKTOTAL:4,CKMB:4,TROPONINI:4 in the  last 72 hours No components found with this basename: POCBNP:3   Radiology/Studies: Us Abdomen Limited  03/02/2012  *RADIOLOGY REPORT*  Clinical Data: Ascites.  Shortness of breath.  LIMITED ABDOMINAL ULTRASOUND  Comparison:  09/30/2011  Findings: Four-quadrant survey of the abdomen demonstrates no ascites.  IMPRESSION:  Exam negative for ascites. *RADIOLOGY REPORT*  LIMITED ABDOMINAL ULTRASOUND  Ultrasound guided paracentesis was requested for this patient. Ultrasound imaging was performed over all four quadrants of the abdomen without evidence of ascites.  Image documentation was obtained of all four quadrants.  No paracentesis performed at this time.  Impression:  Limited abdominal ultrasound sound revealing no evidence of ascites for paracentesis.  Read by: Pamela D Campbell, P.A.-C  Original Report Authenticated By: JOHN A. WATTS V, M.D.   Dg Chest Port 1 View  03/02/2012  *RADIOLOGY REPORT*  Clinical Data: Rule out infiltrate  PORTABLE CHEST - 1 VIEW  Comparison: 03/01/2012  Findings: Previous median sternotomy and CABG procedure.  Heart size appears enlarged.  No pleural effusion or edema.  No airspace consolidation.  IMPRESSION:  1.  No acute cardiopulmonary abnormalities.  Original Report Authenticated By: TAYLOR H. STROUD, M.D.   Echocardiogram:   Conclusions: Left ventricle: The cavity size was normal. Wall thickness was normal. Systolic function was vigorous. The estimated ejection fraction was in the range of 65% to 70%. - Aortic valve: AV is thickened, calcified with restricted motion. Peak and mean gradients through the valve are 76 and 45 mm Hg respectively, consistent with severe AS. Consider TEE to define further as difficult to see well. Trivial regurgitation. - Mitral valve: Mild regurgitation. - Normal atria sizes.  Telemetry: rate controlled atrial fibrillation   Assessment and Plan  1. Severe AS - by TTE yesterday 2. Acute CHF 3. Atrial fibrillation 4. CAD 5. History of GI  bleed 6. Hepatic cirrhosis  Dr. Taylor to see and make further recommendations. Signed, EDMISTEN, BROOKE PA-C  Cardiology Attending  Patient seen and examined. Echo reviewed. Discussed findings with the patient. Will plan TEE and right and left heart cath, hopefully both tomorrow. He could be discharged after and arrange for outpatient surgical consultation.  Gregg Taylor, M.D.   

## 2012-03-07 NOTE — H&P (View-Only) (Signed)
Pt without clinical changes since his evaluation yesterday. I have reviewed the risks, indication, and alternatives to cath and possible PCI. He understands and agrees to proceed. I have reviewed all available echo data as well. For r/l heart cath today.  Mikeyla Music 03/07/2012 4:23 PM  

## 2012-03-07 NOTE — Progress Notes (Signed)
Pt without clinical changes since his evaluation yesterday. I have reviewed the risks, indication, and alternatives to cath and possible PCI. He understands and agrees to proceed. I have reviewed all available echo data as well. For r/l heart cath today.  Tonny Bollman 03/07/2012 4:23 PM

## 2012-03-07 NOTE — CV Procedure (Signed)
    Transesophageal Echocardiogram Note  EDELMIRO INNOCENT [id number] 1946-04-04  Procedure: Transesophageal Echocardiogram Indications: aortic stenosis  Procedure Details Consent: Obtained Time Out: Verified patient identification, verified procedure, site/side was marked, verified correct patient position, special equipment/implants available, Radiology Safety Procedures followed,  medications/allergies/relevent history reviewed, required imaging and test results available.  Performed  Medications: Fentanyl: 75 mcg IV Versed: 4 mg IV  Left Ventrical:  Normal LV function.  EF 65%  Mitral Valve: mild MR  Aortic Valve: Mild AI, severe AS  Tricuspid Valve: normal. Trivial TR  Pulmonic Valve: normal  Left Atrium/ Left atrial appendage: no thrombi, seen moderately well  Atrial septum: no ASD by color flow  Aorta: normal   Complications: No apparent complications Patient did tolerate procedure well.  For cardiac cath this afternoon.  Vesta Mixer, Montez Hageman., MD, [facility] 03/07/2012, 10:32 AM

## 2012-03-07 NOTE — Progress Notes (Signed)
I discussed the cardiac catheterization findings with the patient and his wife. I am going to ask Dr. Dorris Fetch to evaluate him for consideration of redo cardiac surgery. The patient and wife understand that he would be very high-risk considering his previous cardiac surgery and significant comorbidities. I am going to be out tomorrow but will leave a message in the cardiac surgery office about this consultation.  [name] 03/07/2012 6:50 PM

## 2012-03-08 ENCOUNTER — Encounter (HOSPITAL_COMMUNITY): Payer: Self-pay

## 2012-03-08 ENCOUNTER — Encounter (HOSPITAL_COMMUNITY): Payer: Self-pay | Admitting: Cardiovascular Disease

## 2012-03-08 DIAGNOSIS — I350 Nonrheumatic aortic (valve) stenosis: Secondary | ICD-10-CM

## 2012-03-08 DIAGNOSIS — I5032 Chronic diastolic (congestive) heart failure: Secondary | ICD-10-CM

## 2012-03-08 DIAGNOSIS — I509 Heart failure, unspecified: Secondary | ICD-10-CM

## 2012-03-08 DIAGNOSIS — D6959 Other secondary thrombocytopenia: Secondary | ICD-10-CM | POA: Diagnosis present

## 2012-03-08 DIAGNOSIS — E119 Type 2 diabetes mellitus without complications: Secondary | ICD-10-CM

## 2012-03-08 DIAGNOSIS — I359 Nonrheumatic aortic valve disorder, unspecified: Secondary | ICD-10-CM

## 2012-03-08 HISTORY — DX: Nonrheumatic aortic (valve) stenosis: I35.0

## 2012-03-08 LAB — CBC
HCT: 39 % (ref 39.0–52.0)
Platelets: 117 10*3/uL — ABNORMAL LOW (ref 150–400)
RDW: 14.9 % (ref 11.5–15.5)
WBC: 5.2 10*3/uL (ref 4.0–10.5)

## 2012-03-08 LAB — GLUCOSE, CAPILLARY
Glucose-Capillary: 193 mg/dL — ABNORMAL HIGH (ref 70–99)
Glucose-Capillary: 216 mg/dL — ABNORMAL HIGH (ref 70–99)
Glucose-Capillary: 336 mg/dL — ABNORMAL HIGH (ref 70–99)
Glucose-Capillary: 172 mg/dL — ABNORMAL HIGH (ref 70–99)

## 2012-03-08 LAB — OCCULT BLOOD X 1 CARD TO LAB, STOOL: Fecal Occult Bld: POSITIVE

## 2012-03-08 MED ORDER — LINAGLIPTIN 5 MG PO TABS
5.0000 mg | ORAL_TABLET | Freq: Every day | ORAL | Status: DC
  Administered 2012-03-08 – 2012-03-10 (×3): 5 mg via ORAL
  Filled 2012-03-08 (×3): qty 1

## 2012-03-08 NOTE — Progress Notes (Addendum)
PROGRESS NOTE  Subjective:   [name] is a 66 yo with hx of CAD and AS.  Cath yesterday confirmed mod-severe AS and native  CAD with patent LIMA and other grafts.  To have CVTS consult today.  Objective:    Vital Signs:   Temp:  [97.9 F (36.6 C)-98.2 F (36.8 C)] 98 F (36.7 C) (08/15 6295) Pulse Rate:  [73-133] 76  (08/15 0633) Resp:  [5-28] 18  (08/15 0633) BP: (95-139)/(52-98) 123/79 mmHg (08/15 0633) SpO2:  [95 %-99 %] 95 % (08/15 0633) FiO2 (%):  [0 %] 0 % (08/14 0835) Weight:  [258 lb (117.028 kg)] 258 lb (117.028 kg) (08/15 0633)  Last BM Date: 03/07/12   24-hour weight change: Weight change: 1 lb 4.8 oz (0.59 kg)  Weight trends: Filed Weights   03/06/12 0500 03/07/12 0500 03/08/12 0633  Weight: 255 lb 11.2 oz (115.985 kg) 256 lb 11.2 oz (116.438 kg) 258 lb (117.028 kg)    Intake/Output:  08/14 0701 - 08/15 0700 In: 120 [P.O.:120] Out: 1475 [Urine:1475]     Physical Exam: BP 123/79  Pulse 76  Temp 98 F (36.7 C) (Oral)  Resp 18  Ht 5\' 8"  (1.727 m)  Wt 258 lb (117.028 kg)  BMI 39.23 kg/m2  SpO2 95%  General: Vital signs reviewed and noted. Well-developed, well-nourished, in no acute distress; alert, appropriate and cooperative .  Head: Normocephalic, atraumatic.  Eyes: conjunctivae/corneas clear.  EOM's intact.   Throat: normal  Neck: Supple. Normal carotids. No JVD  Lungs:  Clear to auscultation  Heart: Regular rate,+ systolic murmur  Abdomen:  Soft, non-tender, non-distended with normoactive bowel sounds. No hepatomegaly. No rebound/guarding. No abdominal masses.  Extremities: Distal pedal pulses are 2+ .  No edema.    Neurologic: A&O X3, CN II - XII are grossly intact. Motor strength is 5/5 in the all 4 extremities.  Psych: Responds to questions appropriately with normal affect.    Labs: BMET: No results found for this basename: NA:2,K:2,CL:2,CO2:2,GLUCOSE:2,BUN:2,CREATININE:2,CALCIUM:2,MG:2,PHOS:2 in the last 72 hours  Liver function  tests: No results found for this basename: AST:2,ALT:2,ALKPHOS:2,BILITOT:2,PROT:2,ALBUMIN:2 in the last 72 hours No results found for this basename: LIPASE:2,AMYLASE:2 in the last 72 hours  CBC:  Basename 03/08/12 0630 03/07/12 0655  WBC 5.2 6.2  NEUTROABS -- --  HGB 12.7* 13.0  HCT 39.0 38.9*  MCV 86.1 84.7  PLT PENDING 127*    Cardiac Enzymes: No results found for this basename: CKTOTAL:4,CKMB:4,TROPONINI:4 in the last 72 hours  Coagulation Studies: No results found for this basename: LABPROT:5,INR:5 in the last 72 hours    Tele:  NSR  Medications:    Infusions:    . sodium chloride    . sodium chloride Stopped (03/07/12 2200)  . DISCONTD: sodium chloride 20 mL/hr at 03/07/12 0614  . DISCONTD: sodium chloride Stopped (03/07/12 1224)    Scheduled Medications:    . aspirin EC  325 mg Oral Daily  . atorvastatin  20 mg Oral QHS  . digoxin  250 mcg Oral Daily  . fentaNYL      . ferrous gluconate  324 mg Oral TID WC  . glimepiride  4 mg Oral Q supper  . heparin      . insulin aspart  0-15 Units Subcutaneous TID WC  . isosorbide mononitrate  30 mg Oral Daily  . lidocaine      . linagliptin  5 mg Oral Daily  . lisinopril  10 mg Oral Daily  . metoprolol  75 mg Oral  BID  . midazolam      . nitroGLYCERIN      . pantoprazole  40 mg Oral BID AC  . potassium chloride  30 mEq Oral BID  . sodium chloride  3 mL Intravenous Q12H  . verapamil  80 mg Oral TID  . DISCONTD: sodium chloride  3 mL Intravenous Q12H    Assessment/ Plan:   1. Aortic stenosis:  Have consulted Dr. Dorris Fetch to evaluate pt.  He is at increased risk for re-do surgery.    2. Hyperlipidemia:  Continue lipitor  3. Diabetes Mellitus:  Add Trajenta 5 mg. Glucose levels are elevated.    Disposition: surgical consult today.  Length of Stay: 6  Vesta Mixer, Montez Hageman., MD, [facility] 03/08/2012, 7:57 AM Office [phone] Pager [phone]

## 2012-03-08 NOTE — Consult Note (Signed)
CARDIOTHORACIC SURGERY CONSULTATION REPORT  PCP is [name], MD Referring Provider is Tonny Bollman, MD Primary Cardiologist is Lewayne Bunting, MD   Reason for consultation:  Severe aortic stenosis  HPI:  Patient is a 65 year old obese white male from [place], [place] with known history of coronary artery disease, aortic stenosis, permanent atrial fibrillation, cirrhosis with history of ascites and thrombocytopenia, and history of GI bleeding on Coumadin.  The patient originally underwent coronary artery bypass grafting x3 by Dr. Dorris Fetch in 2004. Graft placed at the time of surgery included left internal mammary artery to the distal left anterior descending coronary artery, left radial artery to obtuse marginal branch of left circumflex coronary artery, and saphenous vein graft to the diagonal branch off of the left anterior descending coronary artery. The patient initially did well although he later underwent PCI and stenting for high-grade ostial stenosis of the radial artery graft in 2006 using a drug-eluting stent. The patient has long-standing history of intermittent chest pain and most recently underwent cardiac catheterization in 2012. At that time all of his grafts remained patent and there was no progression of native coronary artery disease. His chest pain and shortness of breath at that time was felt to be noncardiac in origin.   The patient was admitted to the hospital on 03/02/2012 with a prolonged episode of severe substernal chest pain and epigastric pain without radiation that began around 3 in the morning and woke him from his sleep. The patient also noted some increased abdominal swelling and a full feeling at that time, and he has had epigastric and substernal chest pain in the past related to ascites that was alleviated following ultrasound guided paracentesis in March of this year. When the patient was admitted this week cardiac enzymes were all  negative and EKGs were without ischemic change. The patient remains in rate-controlled permanent atrial fibrillation. The patient did not have associated increased shortness of breath.  The patient describes stable mild exertional shortness of breath which he states is actually better than it had been in the past. He denies PND, orthopnea, palpitations, or syncope. He has had some mild lower extremity edema.  The patient underwent transthoracic echocardiogram demonstrating normal left ventricular systolic function with severe aortic stenosis.  The peak velocity across the aortic valve measured 433 cm/s corresponding to peak and mean transvalvular gradients estimated to be 76 and 45 mm mercury respectively.  Transesophageal echocardiogram was performed confirming what appeared to be likely severe aortic stenosis with normal left ventricular systolic function and mild aortic insufficiency. The patient subsequently underwent left and right heart catheterization by Dr. Excell Seltzer. This also confirmed the presence of severe aortic stenosis with a mean transvalvular gradient measured 42 mm mercury and aortic valve area calculated to be 0.76 cm.  Right heart pressures were normal and coronary arteriography confirmed continued patency of all 3 previously placed bypass grafts and no other significant progression of native coronary artery disease. Cardiothoracic surgical consultation has been requested to consider possible aortic valve replacement.  Past Medical History  Diagnosis Date  . Overweight   . Coronary atherosclerosis of artery bypass graft   . Atrial fibrillation     Permanent; off of Coumadin for now due to GI bleed  . Shortness of breath   . Type II or unspecified type diabetes mellitus without mention of complication, not stated as uncontrolled   . OSA (obstructive sleep apnea)   . Insomnia   . Carotid bruit  06/14/2011  . Angina   . Hypertension   . CHF (congestive heart failure)   . GERD  (gastroesophageal reflux disease)   . COPD (chronic obstructive pulmonary disease)   . Iron deficiency anemia     Requiring intravenous iron  . H/O hiatal hernia   . AVM (arteriovenous malformation)     Recurrent GI bleeding requiring multiple transfusions  . Hyperlipidemia   . Thrombocytopenia   . Ascites     status post paracentesis with removal of 3.4 L of ascitic fluid  . Osteoarthritis   . Mediastinal adenopathy 09/22/2011  . Cirrhosis   . Iron deficiency anemia   . Mediastinal adenopathy 09/22/2011  . Chronic diastolic CHF (congestive heart failure) 03/02/2012  . Ascites 09/20/2011  . Permanent atrial fibrillation 03/02/2012  . Aortic stenosis 03/08/2012  . 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 Coronary artery bypass graft  10/15/2002     [facility]. Dorris Fetch, M.D.     . Carpal tunnel release   10/08/2003  . Lipoma surgery   . Hernia repair   . Other surgical history 08/26/2011    [facility],  enteroscopy , revealing "three-four AVMs."   . Tee without cardioversion 03/07/2012    Procedure: TRANSESOPHAGEAL ECHOCARDIOGRAM (TEE);  Surgeon: Vesta Mixer, MD;  Location: [facility] ENDOSCOPY;  Service: Cardiovascular;  Laterality: N/A;  . Coronary angioplasty with stent placement 01/19/2005    drug eluting stent to high grade ostial stenosis of radial artery graft to OM    Family History  Problem Relation Age of Onset  . Coronary artery disease      FAMILY HISTORY  . Hypertension Father   . Diabetes Father   . Heart disease Father   . COPD Sister   . Cancer Maternal Aunt     Breast cancer   . Cancer Maternal Aunt     Breast cancer   . Diabetes Son   . Cancer Daughter     Cervical cancer    Social History History  Substance Use Topics  . Smoking status: Former Smoker -- 1.0 packs/day for 30 years    Types: Cigarettes     Quit date: 07/25/2000  . Smokeless tobacco: Never Used  . Alcohol Use: 0.6 oz/week    1 Shots of liquor per week     Occasionally    Prior to Admission medications   Medication Sig Start Date End Date Taking? Authorizing Provider  Ascorbic Acid (VITAMIN C) 1000 MG tablet Take 1,000 mg by mouth 2 (two) times daily.   Yes Historical Provider, MD  aspirin EC 81 MG tablet Take 1 tablet (81 mg total) by mouth daily. 04/20/11 04/19/12 Yes June Leap, MD  atorvastatin (LIPITOR) 20 MG tablet Take 20 mg by mouth at bedtime.    Yes Historical Provider, MD  digoxin (LANOXIN) 0.25 MG tablet Take 1 tablet (250 mcg total) by mouth daily. 04/27/11  Yes June Leap, MD  ferrous gluconate (FERGON) 325 MG tablet Take 325 mg by mouth 3 (three) times daily with meals.   Yes Historical Provider, MD  furosemide (LASIX) 80 MG tablet Take 80 mg by mouth 2 (two) times daily.   Yes Historical Provider, MD  glimepiride (AMARYL) 4 MG tablet Take 4 mg by mouth daily with supper.    Yes  Historical Provider, MD  isosorbide mononitrate (IMDUR) 30 MG 24 hr tablet Take 1 tablet (30 mg total) by mouth daily. 08/28/11 08/27/12 Yes Rhonda G Barrett, PA  lisinopril (PRINIVIL,ZESTRIL) 10 MG tablet Take 1 tablet (10 mg total) by mouth daily. 01/17/12  Yes June Leap, MD  metFORMIN (GLUCOPHAGE) 1000 MG tablet Take 1,000 mg by mouth 2 (two) times daily with a meal.    Yes Historical Provider, MD  metoprolol (LOPRESSOR) 50 MG tablet Take 1 tablet (50 mg total) by mouth 2 (two) times daily. 12/29/11  Yes June Leap, MD  Multiple Vitamin (MULTIVITAMIN) tablet Take 1 tablet by mouth daily.    Yes Historical Provider, MD  nitroGLYCERIN (NITROSTAT) 0.4 MG SL tablet Place 0.4 mg under the tongue every 5 (five) minutes x 3 doses as needed. For chest pain   Yes Historical Provider, MD  Omega-3 Fatty Acids (FISH OIL) 1200 MG CAPS Take 1,200 mg by mouth 2 (two) times daily.    Yes Historical Provider, MD  omeprazole (PRILOSEC) 20 MG capsule  Take 20 mg by mouth 2 (two) times daily.    Yes Historical Provider, MD  potassium chloride (K-DUR) 10 MEQ tablet Take 10 mEq by mouth 2 (two) times daily.   Yes Historical Provider, MD  sitaGLIPtan (JANUVIA) 100 MG tablet Take 100 mg by mouth daily.    Yes Historical Provider, MD  verapamil (CALAN) 80 MG tablet Take 80 mg by mouth 3 (three) times daily.   Yes Historical Provider, MD  nitroGLYCERIN (NITROSTAT) 0.4 MG SL tablet Place 1 tablet (0.4 mg total) under the tongue every 5 (five) minutes as needed for chest pain. 01/17/11 01/17/12  June Leap, MD    Current Facility-Administered Medications  Medication Dose Route Frequency Provider Last Rate Last Dose  . 0.9 %  sodium chloride infusion  250 mL Intravenous Continuous Tonny Bollman, MD      . 0.9 %  sodium chloride infusion   Intravenous Continuous Tonny Bollman, MD      . acetaminophen (TYLENOL) tablet 650 mg  650 mg Oral Q4H PRN Tonny Bollman, MD      . alum & mag hydroxide-simeth (MAALOX/MYLANTA) 200-200-20 MG/5ML suspension 30 mL  30 mL Oral Q4H PRN Pricilla Riffle, MD   30 mL at 03/02/12 2329  . aspirin EC tablet 325 mg  325 mg Oral Daily Roger A Arguello, PA-C   325 mg at 03/08/12 1033  . atorvastatin (LIPITOR) tablet 20 mg  20 mg Oral QHS Roger A Arguello, PA-C   20 mg at 03/07/12 2143  . digoxin (LANOXIN) tablet 250 mcg  250 mcg Oral Daily Roger A Arguello, PA-C   250 mcg at 03/08/12 1033  . ferrous gluconate (FERGON) tablet 324 mg  324 mg Oral TID WC Pricilla Riffle, MD   324 mg at 03/08/12 1732  . gi cocktail (Maalox,Lidocaine,Donnatal)  30 mL Oral TID PRN Pricilla Riffle, MD      . glimepiride (AMARYL) tablet 4 mg  4 mg Oral Q supper Gery Pray, PA-C   4 mg at 03/08/12 1732  . insulin aspart (novoLOG) injection 0-15 Units  0-15 Units Subcutaneous TID WC Roger A Arguello, PA-C   11 Units at 03/08/12 1732  . isosorbide mononitrate (IMDUR) 24 hr tablet 30 mg  30 mg Oral Daily Roger A Arguello, PA-C   30 mg at 03/08/12 1033  .  linagliptin (TRADJENTA) tablet 5 mg  5 mg Oral Daily Roger  A Arguello, PA-C   5 mg at 03/06/12 0936  . linagliptin (TRADJENTA) tablet 5 mg  5 mg Oral Daily Vesta Mixer, MD   5 mg at 03/08/12 1033  . lisinopril (PRINIVIL,ZESTRIL) tablet 10 mg  10 mg Oral Daily Pricilla Riffle, MD   10 mg at 03/08/12 1033  . metoprolol tartrate (LOPRESSOR) tablet 75 mg  75 mg Oral BID Marinus Maw, MD   75 mg at 03/08/12 1033  . morphine 2 MG/ML injection 2-4 mg  2-4 mg Intravenous Q1H PRN Darrol Jump, PA   4 mg at 03/07/12 1852  . nitroGLYCERIN (NITROSTAT) SL tablet 0.4 mg  0.4 mg Sublingual Q5 Min x 3 PRN Roger A Arguello, PA-C      . ondansetron (ZOFRAN) injection 4 mg  4 mg Intravenous Q6H PRN Tonny Bollman, MD      . pantoprazole (PROTONIX) EC tablet 40 mg  40 mg Oral BID AC Pricilla Riffle, MD   40 mg at 03/08/12 1732  . potassium chloride SA (K-DUR,KLOR-CON) CR tablet 30 mEq  30 mEq Oral BID Roger A Arguello, PA-C   30 mEq at 03/08/12 1033  . sodium chloride 0.9 % injection 3 mL  3 mL Intravenous Q12H Tonny Bollman, MD   3 mL at 03/08/12 1050  . sodium chloride 0.9 % injection 3 mL  3 mL Intravenous PRN Tonny Bollman, MD      . verapamil (CALAN) tablet 80 mg  80 mg Oral TID Gery Pray, PA-C   80 mg at 03/08/12 1732  . zolpidem (AMBIEN) tablet 5 mg  5 mg Oral QHS PRN,MR X 1 Roger A Arguello, PA-C      . DISCONTD: 0.9 %  sodium chloride infusion  250 mL Intravenous PRN Roger A Arguello, PA-C   250 mL at 03/03/12 0500  . DISCONTD: acetaminophen (TYLENOL) tablet 650 mg  650 mg Oral Q4H PRN Gery Pray, PA-C   650 mg at 03/04/12 2018  . DISCONTD: alum & mag hydroxide-simeth (MAALOX/MYLANTA) 200-200-20 MG/5ML suspension 30 mL  30 mL Oral PRN Pricilla Riffle, MD      . DISCONTD: ondansetron [facility]) injection 4 mg  4 mg Intravenous Q6H PRN Roger A Arguello, PA-C      . DISCONTD: sodium chloride 0.9 % injection 3 mL  3 mL Intravenous Q12H Roger A Arguello, PA-C   3 mL at 03/06/12 2122  . DISCONTD:  sodium chloride 0.9 % injection 3 mL  3 mL Intravenous PRN Roger A Arguello, PA-C        Allergies  Allergen Reactions  . Codeine Other (See Comments)    Hurting in chest  . Diltiazem Hcl Itching  . Niacin Other (See Comments)    Hot flashes    Review of Systems:  General:  normal appetite, decreasedl energy   Respiratory:  no cough, no wheezing, no hemoptysis, no pain with inspiration or cough, + mild exertional shortness of breath   Cardiac:  + chest pain or tightness, + exertional SOB, no resting SOB, no PND, no orthopnea, + mild LE edema, no palpitations, no syncope  GI:   no difficulty swallowing, no hematochezia, no hematemesis, no melena, no constipation, no diarrhea, + hemeoccult + stool this admission, + abdominal swelling  GU:   no dysuria, no urgency, no frequency   Musculoskeletal: + arthritis both hands and elbow, no arthralgia   Vascular:  no pain suggestive of claudication   Neuro:   no symptoms  suggestive of TIA's, no seizures, no headaches, no peripheral neuropathy   Endocrine:  Type II DM  HEENT:  no loose teeth or painful teeth,  no recent vision changes  Psych:   no anxiety, no depression    Physical Exam:   BP 122/76  Pulse 62  Temp 98.1 F (36.7 C) (Oral)  Resp 16  Ht 5\' 8"  (1.727 m)  Wt 117.028 kg (258 lb)  BMI 39.23 kg/m2  SpO2 97%  General:  obese  well-appearing  HEENT:  Unremarkable   Neck:   no JVD, no bruits, no adenopathy   Chest:   clear to auscultation, symmetrical breath sounds, no wheezes, no rhonchi   CV:   Irregular rate and rhythm, grade IV/VI crescendo/decrescendo systolic murmur   Abdomen:  soft, non-tender, no masses, + swelling  Extremities:  warm, well-perfused, pulses diminished, old scar right lower leg from SVG harvest, chronic skin changes  Rectal/GU  Deferred  Neuro:   Grossly non-focal and symmetrical throughout  Skin:   Clean and dry, no rashes, no breakdown  Diagnostic Tests:  Transthoracic Echocardiography  (Report  amended )  Patient:    [name], [name] MR #:       [id number] Study Date: 03/05/2012 Gender:     M Age:        40 Height:     172.7cm Weight:     117kg BSA:        2.86m^2 Pt. Status: Room:       3W39C    Delice Bison  REFERRING    Lewayne Bunting  ATTENDING    Dietrich Pates  PERFORMING   [place], [facility]  SONOGRAPHER  Emelia Loron, RDCS cc:  ------------------------------------------------------------ LV EF: 65% -   70%  ------------------------------------------------------------ Indications:      CHF - 428.0.  ------------------------------------------------------------ History:   Risk factors:  Hypertension. Diabetes mellitus. Obese. Dyslipidemia.  ------------------------------------------------------------ Study Conclusions  - Left ventricle: The cavity size was normal. Wall thickness   was normal. Systolic function was vigorous. The estimated   ejection fraction was in the range of 65% to 70%. - Aortic valve: AV is thickened, calcified with restricted   motion. Peak and mean gradients through the valve are 76   and 45 mm Hg respectively consistent with severe AS.   consider TEE to define further as difficult to see well.   Trivial regurgitation. - Mitral valve: Mild regurgitation.  ------------------------------------------------------------ Labs, prior tests, procedures, and surgery: Coronary artery bypass grafting.  Transthoracic echocardiography.  M-mode, complete 2D, spectral Doppler, and color Doppler.  Height:  Height: 172.7cm. Height: 68in.  Weight:  Weight: 117kg. Weight: 257.4lb.  Body mass index:  BMI: 39.2kg/m^2.  Body surface area:    BSA: 2.35m^2.  Blood pressure:     122/78.  Patient status:  Inpatient.  Location:  Bedside.  ------------------------------------------------------------  ------------------------------------------------------------ Left ventricle:  The cavity size was normal. Wall thickness was normal. Systolic  function was vigorous. The estimated ejection fraction was in the range of 65% to 70%.  ------------------------------------------------------------ Aortic valve:  AV is thickened, calcified with restricted motion. Peak and mean gradients through the valve are 76 and 45 mm Hg respectively consistent with severe AS. consider TEE to define further as difficult to see well.  Doppler: Trivial regurgitation.    VTI ratio of LVOT to aortic valve: 0.38. Valve area: 1.07cm^2(VTI). Indexed valve area: 0.47cm^2/m^2 (VTI). Peak velocity ratio of LVOT to aortic valve: 0.33. Valve area: 0.92cm^2 (Vmax). Indexed  valve area: 0.4cm^2/m^2 (Vmax).    Mean gradient: 39mm Hg (S). Peak gradient: 75mm Hg (S).  ------------------------------------------------------------ Mitral valve:   Mildly thickened leaflets .  Doppler:   Mild regurgitation.    Peak gradient: 3mm Hg (D).  ------------------------------------------------------------ Left atrium:  The atrium was normal in size.  ------------------------------------------------------------ Right ventricle:  The cavity size was normal. Wall thickness was normal. Systolic function was normal.  ------------------------------------------------------------ Tricuspid valve:   Structurally normal valve.   Leaflet separation was normal.  Doppler:  Transvalvular velocity was within the normal range.  No regurgitation.  ------------------------------------------------------------ Right atrium:  The atrium was normal in size.  ------------------------------------------------------------ Pericardium:  There was no pericardial effusion.  ------------------------------------------------------------  2D measurements        Normal  Doppler measurements   Normal Left ventricle                 Left ventricle LVID ED,   49.4 mm     43-52   Ea, lat      12 cm/s   ------ chord,                         ann, tiss PLAX                           DP LVID ES,   29.9 mm      23-38   E/Ea, lat  7.69        ------ chord,                         ann, tiss PLAX                           DP FS, chord,   39 %      >29     LVOT PLAX                           Peak vel,   141 cm/s   ------ LVPW, ED   10.9 mm     ------  S IVS/LVPW   1.04        <1.3    VTI, S     26.6 cm     ------ ratio, ED                      Peak          8 mm Hg  ------ Ventricular septum             gradient, IVS, ED    11.3 mm     ------  S LVOT                           Aortic valve Diam, S      19 mm     ------  Peak vel,   433 cm/s   ------ Area       2.84 cm^2   ------  S Aorta                          Mean vel,   284 cm/s   ------ Root diam,   34 mm     ------  S ED  VTI, S     70.9 cm     ------ Left atrium                    Mean         39 mm Hg  ------ AP dim       35 mm     ------  gradient, AP dim     1.54 cm/m^2 <2.2    S index                          Peak         75 mm Hg  ------                                gradient,                                S                                VTI ratio  0.38        ------                                LVOT/AV                                Area, VTI  1.07 cm^2   ------                                Area index 0.47 cm^2/m ------                                (VTI)           ^2                                Peak vel   0.33        ------                                ratio,                                LVOT/AV                                Area, Vmax 0.92 cm^2   ------                                Area index  0.4 cm^2/m ------                                (Vmax)          Karie Fetch  Mitral valve                                Peak E vel 92.3 cm/s   ------                                Peak A vel 50.3 cm/s   ------                                Decelerati  213 ms     150-23                                on time                0                                Peak           3 mm Hg  ------                                gradient,                                D                                Peak E/A    1.8        ------                                ratio   ------------------------------------------------------------ Constance Holster 2013-08-12T12:50:[id number]    Transesophageal Echocardiography  Patient:    [name], [name] MR #:       [id number] Study Date: 03/07/2012 Gender:     M Age:        64 Height:     172.7cm Weight:     116.4kg BSA:        2.26m^2 Pt. Status: Room:       3W39C    PERFORMING   [place], Arlice Colt  ATTENDING    Dietrich Pates  SONOGRAPHER  Silvano Bilis, RCS  [facility], [facility]    [place], [place] cc:  ------------------------------------------------------------ LV EF: 50% -   55%  ------------------------------------------------------------ Indications:      Aortic stenosis 424.1.  ------------------------------------------------------------ History:   PMH:   Dyspnea.  Atrial fibrillation.  Coronary artery disease.  Congestive heart failure.  Risk factors: Hypertension. Dyslipidemia.  ------------------------------------------------------------ Study Conclusions  - Left ventricle: Systolic function was normal. The   estimated ejection fraction was in the range of 50% to   55%. - Aortic valve: There was severe stenosis. Mild   regurgitation. - Mitral valve: Mild regurgitation. - Left atrium: No evidence of thrombus in the atrial cavity   or appendage. - Right atrium: No evidence of thrombus in the atrial cavity   or appendage. -  Atrial septum: No defect or patent foramen ovale was   identified. Transesophageal echocardiography.  2D and color Doppler. Height:  Height: 172.7cm. Height: 68in.  Weight:  Weight: 116.4kg. Weight: 256lb.  Body mass index:  BMI: 39kg/m^2. Body surface area:    BSA: 2.4m^2.  Blood pressure: 95/52.  Patient status:  Inpatient.   Location:  Endoscopy.   ------------------------------------------------------------  ------------------------------------------------------------ Left ventricle:  Systolic function was normal. The estimated ejection fraction was in the range of 50% to 55%.  ------------------------------------------------------------ Aortic valve:  There appears to be fusion of the noncoronary cusp and right coronary cusp.  Moderately thickened, severely calcified leaflets.  Doppler:   There was severe stenosis.    Mild regurgitation.  ------------------------------------------------------------ Aorta:  The aorta was normal, not dilated, and non-diseased.   ------------------------------------------------------------ Mitral valve:   Doppler:   Mild regurgitation.  ------------------------------------------------------------ Left atrium:  The atrium was normal in size.  No evidence of thrombus in the atrial cavity or appendage.  ------------------------------------------------------------ Atrial septum:  No defect or patent foramen ovale was identified.  ------------------------------------------------------------ Right ventricle:  The cavity size was normal. Wall thickness was normal. Systolic function was normal.  ------------------------------------------------------------ Pulmonic valve:    Doppler:   No significant regurgitation.   ------------------------------------------------------------ Tricuspid valve:   Doppler:   Trivial regurgitation.  ------------------------------------------------------------ Right atrium:  The atrium was normal in size.  No evidence of thrombus in the atrial cavity or appendage.   ------------------------------------------------------------ Post procedure conclusions Ascending Aorta:  - The aorta was normal, not dilated, and non-diseased.  ------------------------------------------------------------ Prepared and Electronically Authenticated  by  Kristeen Miss 2013-08-14T11:59:[id number]   Cardiac Catheterization Procedure Note  Name: [name]  MRN: [id number]  DOB: 1946-02-24  Procedure: Right Heart Cath, Left Heart Cath, Selective Coronary Angiography, LV angiography  Indication: Severe aortic stenosis. This is a medically complicated 65 year old gentleman with CAD status post CABG. He presented with chest pain on this admission. His echocardiogram was suggestive of severe aortic stenosis. She underwent a transesophageal echo this morning as a confirmatory study and this was also suggestive of severe AS. He is now referred for right and left heart catheterization with coronary angiography. He also has a history of GI bleeding on Coumadin, permanent atrial fibrillation, and hepatic cirrhosis.  Procedural Details: The right groin was prepped, draped, and anesthetized with 1% lidocaine. Using the modified Seldinger technique a 5 French sheath was placed in the right femoral artery and a 7 French sheath was placed in the right femoral vein. A Swan-Ganz catheter was used for the right heart catheterization. Standard protocol was followed for recording of right heart pressures and sampling of oxygen saturations. Fick cardiac output was calculated. Standard Judkins catheters were used for selective coronary angiography and left ventriculography. The femoral sheath was upsized to a 6 French sheath so the [place] catheter can be used to obtain simultaneous LV and AO pressures. I was unable to cross the aortic valve with a pigtail catheter and J-tip wire. An AL-1 catheter and straight-tip wire were used and this was successful. The AL-1 catheter was changed out for pigtail over the exchange length straight tip wire. Simultaneous pressures were recorded. There were no immediate procedural complications. The patient was transferred to the post catheterization recovery area for further monitoring.  Procedural Findings:  Hemodynamics  RA mean of 5   RV 31/2  PA 28/18 with a mean of 22  PCWP 7  LV 156/10  AO 124/73 with a mean of 93  Oxygen saturations:  PA 63  AO 97  Cardiac Output (Fick) 5.0  Cardiac Index (Fick) 2.2  Aortic valve hemodynamics: Mean gradient is 42, aortic valve area is 0.76  Coronary angiography:  Coronary dominance: right  Left mainstem: Patent with no significant obstructive disease  Left anterior descending (LAD): Patent with 70% proximal stenosis. The mid LAD fills competitively from the native vessel, vein graft to diagonal, and LIMA to LAD.  Left circumflex (LCx): Widely patent through its stented segment in the mid circumflex. The first OM appears to be occluded. The second OM is patent. The AV groove circumflex courses down and supplies multiple small posterolateral branches with diffuse disease.  Right coronary artery (RCA): Dominant vessel. Diffuse plaquing but no high-grade obstructive disease is noted. The PDA is patent without significant stenosis. There are scattered 20-30% stenosis throughout the distribution of the RCA.  Free radial to obtuse marginal is widely patent throughout.  Saphenous vein graft to diagonal is widely patent. There is no obstructive disease noted. The diagonal fills antegrade in the LAD fills retrograde from graft flow.  LIMA to LAD is widely patent. There is brisk washout of the LAD from the multiple sources of blood flow.  Left ventriculography: LV function is vigorous. The estimated ejection fraction is 65-70%. The aortic valve shows moderate calcification.  Final Conclusions:  1. Moderate multivessel coronary artery disease as described  2. Status post multivessel coronary bypass surgery with continued patency of the LIMA to LAD, free radial to obtuse marginal, and saphenous vein graft to diagonal.  3. Vigorous LV systolic function  4. Severe aortic stenosis  Recommendations: Will request cardiac surgery consultation. I will carefully review all of his echo data as well. He  will obviously be high risk for consideration of conventional aortic valve replacement considering his previous cardiac surgery, history of GI bleeding, and hepatic cirrhosis.  Tonny Bollman  03/07/2012, 5:11 PM      Impression:  Severe aortic stenosis with history of coronary artery disease and patent bypass grafts having previously undergone coronary artery bypass grafting x3 in 2004. The patient describes stable symptoms of mild exertional shortness of breath which are chronic and likely multifactorial. The patient's episode of prolonged chest discomfort which prompted hospitalization remains of unclear etiology and may not be of cardiac origin.  Risks associated with conventional aortic valve replacement via a redo median sternotomy would be fairly high given the patient's underlying comorbidities, particularly his severe chronic liver disease. The patient has Hemoccult-positive stool on this admission although his hemoglobin is fairly normal.    Plan:  I discussed matters at length the patient and his wife this afternoon. I favor pursuing formal GI medicine consultation to further evaluate the patient's current Hemoccult-positive stool and the severity of his underlying chronic liver disease. Will get an ultrasound of the liver to evaluate portal hypertension.  The patient will also need pulmonary function testing.     Salvatore Decent. Cornelius Moras, MD 03/08/2012 6:32 PM

## 2012-03-08 NOTE — Progress Notes (Signed)
Inpatient Diabetes Program Recommendations  AACE/ADA: New Consensus Statement on Inpatient Glycemic Control (2013)  Target Ranges:  Prepandial:   less than 140 mg/dL      Peak postprandial:   less than 180 mg/dL (1-2 hours)      Critically ill patients:  140 - 180 mg/dL    Results for ALEXSANDRO, SALEK (MRN [id number]) as of 03/08/2012 11:33  Ref. Range 03/07/2012 07:27 03/07/2012 12:09 03/07/2012 17:20 03/07/2012 18:54 03/07/2012 20:14  Glucose-Capillary Latest Range: 70-99 mg/dL 865 (H) 784 (H) 696 (H) 160 (H) 245 (H)    Results for RYKIN, ROUTE (MRN [id number]) as of 03/08/2012 11:33  Ref. Range 03/08/2012 07:38  Glucose-Capillary Latest Range: 70-99 mg/dL 440 (H)   Noted patient for TCTS consult today.  CBGs uncontrolled in hospital.  Patient has been getting Tradjenta 5 mg daily since admission.  Please consider the following recommendations:  Inpatient Diabetes Program Recommendations Insulin - Basal: Please consider starting Lantus for patient while in hospital- Lantus 15 units QHS Oral Agents: Patient has been getting Tradjenta 5 mg daily since admission. HgbA1C: Please check A1c before patient is dicharged- Last A1c on record was from November 2012  Note: Will follow. Ambrose Finland RN, MSN, CDE Diabetes Coordinator Inpatient Diabetes Program [phone]

## 2012-03-09 ENCOUNTER — Inpatient Hospital Stay (HOSPITAL_COMMUNITY): Payer: Managed Care, Other (non HMO)

## 2012-03-09 DIAGNOSIS — I359 Nonrheumatic aortic valve disorder, unspecified: Principal | ICD-10-CM

## 2012-03-09 LAB — GLUCOSE, CAPILLARY
Glucose-Capillary: 175 mg/dL — ABNORMAL HIGH (ref 70–99)
Glucose-Capillary: 164 mg/dL — ABNORMAL HIGH (ref 70–99)
Glucose-Capillary: 225 mg/dL — ABNORMAL HIGH (ref 70–99)

## 2012-03-09 LAB — PULMONARY FUNCTION TEST

## 2012-03-09 LAB — BLOOD GAS, ARTERIAL
Bicarbonate: 23.6 mEq/L (ref 20.0–24.0)
Drawn by: 36274
FIO2: 21 %
O2 Saturation: 99.9 %
Patient temperature: 98.6
pH, Arterial: 7.439 (ref 7.350–7.450)
pO2, Arterial: 119 mmHg — ABNORMAL HIGH (ref 80.0–100.0)

## 2012-03-09 LAB — COMPREHENSIVE METABOLIC PANEL
AST: 59 U/L — ABNORMAL HIGH (ref 0–37)
Albumin: 3.9 g/dL (ref 3.5–5.2)
BUN: 20 mg/dL (ref 6–23)
Calcium: 10 mg/dL (ref 8.4–10.5)
Creatinine, Ser: 0.89 mg/dL (ref 0.50–1.35)
Total Bilirubin: 0.6 mg/dL (ref 0.3–1.2)

## 2012-03-09 LAB — HEMOGLOBIN A1C: Mean Plasma Glucose: 206 mg/dL — ABNORMAL HIGH (ref <117)

## 2012-03-09 LAB — CBC
MCH: 28.3 pg (ref 26.0–34.0)
MCHC: 33 g/dL (ref 30.0–36.0)
Platelets: 129 10*3/uL — ABNORMAL LOW (ref 150–400)
RDW: 15 % (ref 11.5–15.5)

## 2012-03-09 MED ORDER — FUROSEMIDE 80 MG PO TABS
80.0000 mg | ORAL_TABLET | Freq: Two times a day (BID) | ORAL | Status: DC
  Administered 2012-03-09 – 2012-03-10 (×2): 80 mg via ORAL
  Filled 2012-03-09 (×4): qty 1

## 2012-03-09 MED ORDER — IOHEXOL 300 MG/ML  SOLN
100.0000 mL | Freq: Once | INTRAMUSCULAR | Status: AC | PRN
  Administered 2012-03-09: 100 mL via INTRAVENOUS

## 2012-03-09 NOTE — Progress Notes (Signed)
    Subjective:  No further chest pain. Dyspnea stable. No complaints this afternoon.  Objective:  Vital Signs in the last 24 hours: Temp:  [97.5 F (36.4 C)-98.1 F (36.7 C)] 97.5 F (36.4 C) (08/16 0522) Pulse Rate:  [62-151] 104  (08/16 1135) Resp:  [16-18] 18  (08/16 0522) BP: (94-149)/(63-80) 94/70 mmHg (08/16 1135) SpO2:  [95 %-99 %] 99 % (08/16 0522) Weight:  [119.432 kg (263 lb 4.8 oz)] 119.432 kg (263 lb 4.8 oz) (08/16 0522)  Intake/Output from previous day: 08/15 0701 - 08/16 0700 In: 480 [P.O.:480] Out: 300 [Urine:300]  Physical Exam: Pt is alert and oriented, pleasant male in NAD HEENT: normal Neck: JVP - normal Lungs: CTA bilaterally CV: irregularly irregular with grade 2/6 harsh systolic murmur at the LSB Abd: soft, NT, Positive BS Ext: no C/C/E, distal pulses intact and equal Skin: warm/dry no rash   Lab Results:  Basename 03/09/12 0517 03/08/12 0630  WBC 5.7 5.2  HGB 12.8* 12.7*  PLT 129* 117*    Basename 03/09/12 0517  NA 137  K 5.0  CL 100  CO2 25  GLUCOSE 187*  BUN 20  CREATININE 0.89   No results found for this basename: TROPONINI:2,CK,MB:2 in the last 72 hours  Tele: atrial fibrillation heart rate 80-100 bpm  Assessment/Plan:  1. Severe aortic stenosis 2. Hepatic cirrhosis with chronic ascites 3. CAD s/p CABG - patent grafts by cath this admission 4. 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 Formal GI evaluation pending. Currently stable from cardiac perspective. Treatment options for his AS include redo cardiac surgery or TAVR versus watchful waiting. I am concerned about the rate of progression of his AS and suspect he will need surgical intervention in the next year. Will discuss further with the surgical team.  Tonny Bollman, M.D. 03/09/2012, 1:34 PM

## 2012-03-09 NOTE — Consult Note (Addendum)
Referring Provider: Dr. Cornelius Moras Primary Care Physician:  [name], MD Primary Gastroenterologist:  Gentry Fitz  Reason for Consultation:  Heme positive stool; Cirrhosis  HPI: [name] is a 66 y.o. male with severe aortic stenosis who was admitted for shortness of breath and chest pain. He has a history of anemia with AVMs in the stomach and duodenum that were fulgurated and clipped last October by Dr. [name]. He is s/p a colonoscopy/EGD/capsule last fall for this anemia. He had 3.4 L of ascitic fluid drained at [facility] and he states that was the first time he has had that done and says no infection was seen. He thinks he had a Hep C test but he is not sure. He recalls being vaccinated this year to Hep A,B. Rare alcohol use. Doppler U/S did not show portal hypertension but liver looked diffusely heterogeneous, splenomegaly, and trace ascites. Has black stools but takes iron pills. Denies hematochezia, abdominal pain, N/V, jaundice, tea-colored urine. +Fatigue.       Past Medical History  Diagnosis Date  . Overweight   . Coronary atherosclerosis of artery bypass graft   . Atrial fibrillation     Permanent; off of Coumadin for now due to GI bleed  . Shortness of breath   . Type II or unspecified type diabetes mellitus without mention of complication, not stated as uncontrolled   . OSA (obstructive sleep apnea)   . Insomnia   . Carotid bruit 06/14/2011  . Angina   . Hypertension   . CHF (congestive heart failure)   . GERD (gastroesophageal reflux disease)   . COPD (chronic obstructive pulmonary disease)   . Iron deficiency anemia     Requiring intravenous iron  . H/O hiatal hernia   . AVM (arteriovenous malformation)     Recurrent GI bleeding requiring multiple transfusions  . Hyperlipidemia   . Thrombocytopenia   . Ascites     status post paracentesis with removal of 3.4 L of ascitic fluid  . Osteoarthritis   . Mediastinal adenopathy 09/22/2011  . Cirrhosis     . Iron deficiency anemia   . Mediastinal adenopathy 09/22/2011  . Chronic diastolic CHF (congestive heart failure) 03/02/2012  . Ascites 09/20/2011  . Permanent atrial fibrillation 03/02/2012  . Aortic stenosis 03/08/2012  . 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 Coronary artery bypass graft  10/15/2002     Salvatore Decent. Dorris Fetch, M.D.     . Carpal tunnel release   10/08/2003  . Lipoma surgery   . Hernia repair   . Other surgical history 08/26/2011    [facility],  enteroscopy , revealing "three-four AVMs."   . Tee without cardioversion 03/07/2012    Procedure: TRANSESOPHAGEAL ECHOCARDIOGRAM (TEE);  Surgeon: Vesta Mixer, MD;  Location: [facility] ENDOSCOPY;  Service: Cardiovascular;  Laterality: N/A;  . Coronary angioplasty with stent placement 01/19/2005    drug eluting stent to high grade ostial stenosis of radial artery graft to OM    Prior to Admission medications   Medication Sig Start Date End Date Taking? Authorizing Provider  Ascorbic Acid (VITAMIN C) 1000 MG tablet Take 1,000 mg by mouth 2 (two) times daily.   Yes Historical Provider, MD  aspirin EC 81 MG tablet Take 1 tablet (81 mg total) by mouth daily. 04/20/11 04/19/12 Yes Guy E de  Natasha Bence, MD  atorvastatin (LIPITOR) 20 MG tablet Take 20 mg by mouth at bedtime.    Yes Historical Provider, MD  digoxin (LANOXIN) 0.25 MG tablet Take 1 tablet (250 mcg total) by mouth daily. 04/27/11  Yes June Leap, MD  ferrous gluconate (FERGON) 325 MG tablet Take 325 mg by mouth 3 (three) times daily with meals.   Yes Historical Provider, MD  furosemide (LASIX) 80 MG tablet Take 80 mg by mouth 2 (two) times daily.   Yes Historical Provider, MD  glimepiride (AMARYL) 4 MG tablet Take 4 mg by mouth daily with supper.    Yes Historical Provider, MD  isosorbide mononitrate (IMDUR) 30 MG 24 hr tablet Take 1 tablet  (30 mg total) by mouth daily. 08/28/11 08/27/12 Yes Rhonda G Barrett, PA  lisinopril (PRINIVIL,ZESTRIL) 10 MG tablet Take 1 tablet (10 mg total) by mouth daily. 01/17/12  Yes June Leap, MD  metFORMIN (GLUCOPHAGE) 1000 MG tablet Take 1,000 mg by mouth 2 (two) times daily with a meal.    Yes Historical Provider, MD  metoprolol (LOPRESSOR) 50 MG tablet Take 1 tablet (50 mg total) by mouth 2 (two) times daily. 12/29/11  Yes June Leap, MD  Multiple Vitamin (MULTIVITAMIN) tablet Take 1 tablet by mouth daily.    Yes Historical Provider, MD  nitroGLYCERIN (NITROSTAT) 0.4 MG SL tablet Place 0.4 mg under the tongue every 5 (five) minutes x 3 doses as needed. For chest pain   Yes Historical Provider, MD  Omega-3 Fatty Acids (FISH OIL) 1200 MG CAPS Take 1,200 mg by mouth 2 (two) times daily.    Yes Historical Provider, MD  omeprazole (PRILOSEC) 20 MG capsule Take 20 mg by mouth 2 (two) times daily.    Yes Historical Provider, MD  potassium chloride (K-DUR) 10 MEQ tablet Take 10 mEq by mouth 2 (two) times daily.   Yes Historical Provider, MD  sitaGLIPtan (JANUVIA) 100 MG tablet Take 100 mg by mouth daily.    Yes Historical Provider, MD  verapamil (CALAN) 80 MG tablet Take 80 mg by mouth 3 (three) times daily.   Yes Historical Provider, MD  nitroGLYCERIN (NITROSTAT) 0.4 MG SL tablet Place 1 tablet (0.4 mg total) under the tongue every 5 (five) minutes as needed for chest pain. 01/17/11 01/17/12  June Leap, MD    Scheduled Meds:   . aspirin EC  325 mg Oral Daily  . atorvastatin  20 mg Oral QHS  . digoxin  250 mcg Oral Daily  . ferrous gluconate  324 mg Oral TID WC  . furosemide  80 mg Oral BID  . glimepiride  4 mg Oral Q supper  . insulin aspart  0-15 Units Subcutaneous TID WC  . isosorbide mononitrate  30 mg Oral Daily  . linagliptin  5 mg Oral Daily  . linagliptin  5 mg Oral Daily  . lisinopril  10 mg Oral Daily  . metoprolol  75 mg Oral BID  . pantoprazole  40 mg Oral BID AC  . potassium chloride   30 mEq Oral BID  . sodium chloride  3 mL Intravenous Q12H  . verapamil  80 mg Oral TID   Continuous Infusions:   . sodium chloride     PRN Meds:.acetaminophen, alum & mag hydroxide-simeth, gi cocktail, morphine injection, nitroGLYCERIN, ondansetron (ZOFRAN) IV, sodium chloride, zolpidem  Allergies as of 03/02/2012 - Review Complete 03/02/2012  Allergen Reaction Noted  . Codeine Other (See Comments) 12/27/2010  . Diltiazem  hcl Itching   . Niacin Other (See Comments)     Family History  Problem Relation Age of Onset  . Coronary artery disease      FAMILY HISTORY  . Hypertension Father   . Diabetes Father   . Heart disease Father   . COPD Sister   . Cancer Maternal Aunt     Breast cancer   . Cancer Maternal Aunt     Breast cancer   . Diabetes Son   . Cancer Daughter     Cervical cancer    History   Social History  . Marital Status: Married    Spouse Name: N/A    Number of Children: 3  . Years of Education: N/A   Occupational History  .      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 Smoking status: Former Smoker -- 1.0 packs/day for 30 years    Types: Cigarettes    Quit date: 07/25/2000  . Smokeless tobacco: Never Used  . Alcohol Use: 0.6 oz/week    1 Shots of liquor per week     Occasionally  . Drug Use: No  . Sexually Active: Yes   Other Topics Concern  . Not on file   Social History Narrative   Lives in [place], [place] with wife.     Review of Systems: All negative except as stated above in HPI.  Physical Exam: Vital signs: Filed Vitals:   03/09/12 1400  BP: 113/69  Pulse: 84  Temp: 98.2 F (36.8 C)  Resp: 18   Last BM Date: 03/08/12 General:   Alert,  Obese, pleasant and cooperative in NAD HEENT: anicteric, oral pharynx clear Neck: no LAD, supple, nontender Lungs:  Clear throughout to auscultation.   No wheezes, crackles, or rhonchi. No acute distress. Heart:  Regular rate and rhythm Abdomen: soft, nontender,  nondistended, positive bowel sounds  Rectal:  Deferred Ext: hyperpigmentation; no edema  GI:  Lab Results:  Basename 03/09/12 0517 03/08/12 0630 03/07/12 0655  WBC 5.7 5.2 6.2  HGB 12.8* 12.7* 13.0  HCT 38.8* 39.0 38.9*  PLT 129* 117* 127*   BMET  Basename 03/09/12 0517  NA 137  K 5.0  CL 100  CO2 25  GLUCOSE 187*  BUN 20  CREATININE 0.89  CALCIUM 10.0   LFT  Basename 03/09/12 0517  PROT 7.9  ALBUMIN 3.9  AST 59*  ALT 60*  ALKPHOS 106  BILITOT 0.6  BILIDIR --  IBILI --   PT/INR No results found for this basename: LABPROT:2,INR:2 in the last 72 hours   Studies/Results: Dg Chest 2 View  03/09/2012  *RADIOLOGY REPORT*  Clinical Data: Rule out congestive heart failure.  CHEST - 2 VIEW  Comparison: 03/02/2012  Findings: The patient is status post median sternotomy and CABG. Heart size is mildly enlarged and stable. Some apical pulmonary vascular redistribution is seen suggesting pulmonary venous hypertensive changes.  The lung fields are clear with no evidence for focal infiltrate or congestive failure.  No pleural fluid or significant peribronchial cuffing is seen.  Calcified granulomata are again seen in the lower lobes.  Bony structures demonstrate degenerative change of the mid and lower thoracic spine and are stable.  IMPRESSION: Stable cardiopulmonary appearance with no signs of congestive failure identified.  Original Report Authenticated By: Bertha Stakes, M.D.   US Abdomen Complete  03/09/2012  *RADIOLOGY REPORT*  Clinical Data:  Cirrhosis.  Abdominal distention.  ABDOMINAL ULTRASOUND COMPLETE  Comparison:  09/30/2011  Findings:  Gallbladder:  Small echogenic foci are suspended within the lumen of the gallbladder probably representing tiny nonshadowing gallstones or large cholesterol crystals.  There is significant wall thickening measuring up to 6 mm.  Negative Murphy's sign.  Common Bile Duct:  5 mm in caliber.  Liver: Diffusely heterogeneous and increased  signal throughout the liver is present.  There is a nodular echotexture with no focal mass.  IVC:  Appears normal.  Pancreas:  Obscured partially by overlying bowel gas.  Spleen:  The spleen measures 13.5 x 8.4 x 12.4 cm and is mildly enlarged.  Right kidney:  13.3 cm in length.  No mass or hydronephrosis. Cortical echogenicity within normal limits.  Left kidney:  13.5 cm in length.  Normal cortical echogenicity.  No hydronephrosis or mass.  Abdominal Aorta:  Aorta was partially obscured.  No obvious aneurysm.  IMPRESSION: Echogenic foci within the lumen of the gallbladder are present compatible with tiny gallstones or large cholesterol crystals. There is wall thickening of the gallbladder with a negative sonographic Murphy's sign.  Wall thickening is nonspecific and may be related to hepatocellular disease.  Heterogeneous signal throughout the liver compatible with diffuse hepatic disease.  Splenomegaly.  Limited visualization of the pancreas and aorta.  *RADIOLOGY REPORT*  Clinical Data: Cirrhosis  DUPLEX ULTRASOUND OF LIVER  Technique:  Color and duplex Doppler ultrasound was performed to evaluate the hepatic in-flow and out-flow vessels.  Comparison:  None.  Portal Vein Velocities: Main:      31.9 cm/sec Right:      22.2 cm/sec Left:        32.9 cm/sec  Hepatic Vein Velocities: Right:     Unable to visualize cm/sec Middle:  68.5 cm/sec Left:       52.2 cm/sec  Hepatic Artery Velocity:   104.5 cm/sec Splenic Vein Velocity:      30.9 cm/sec  Varices:   Absent Ascites:   Trace  Findings: Portal vein direction of flow is hepato pedal.  Hepatic vein flow is hepatofugal.  The right hepatic vein could not be identified.  This is likely due to body habitus and technical factors.  Splenic vein is patent.  IMPRESSION: Portal vein as well as the right and left branches are patent with normal direction of flow.  The middle and left hepatic veins are patent with hepatofugal flow. The right hepatic vein could not be  visualized likely due to technical factors.  Flow within this branch cannot be confirmed.  Trace ascites.  Original Report Authenticated By: Donavan Burnet, M.D.   [place] Art/ven Flow Abd Pelv Doppler  03/09/2012  *RADIOLOGY REPORT*  Clinical Data:  Cirrhosis.  Abdominal distention.  ABDOMINAL ULTRASOUND COMPLETE  Comparison:  09/30/2011  Findings:  Gallbladder:  Small echogenic foci are suspended within the lumen of the gallbladder probably representing tiny nonshadowing gallstones or large cholesterol crystals.  There is significant wall thickening measuring up to 6 mm.  Negative Murphy's sign.  Common Bile Duct:  5 mm in caliber.  Liver: Diffusely heterogeneous and increased signal throughout the liver is present.  There is a nodular echotexture with no focal mass.  IVC:  Appears normal.  Pancreas:  Obscured partially by overlying bowel gas.  Spleen:  The spleen measures 13.5 x 8.4 x 12.4 cm and is mildly enlarged.  Right kidney:  13.3 cm in length.  No mass or hydronephrosis. Cortical echogenicity within normal limits.  Left kidney:  13.5 cm in length.  Normal cortical echogenicity.  No hydronephrosis  or mass.  Abdominal Aorta:  Aorta was partially obscured.  No obvious aneurysm.  IMPRESSION: Echogenic foci within the lumen of the gallbladder are present compatible with tiny gallstones or large cholesterol crystals. There is wall thickening of the gallbladder with a negative sonographic Murphy's sign.  Wall thickening is nonspecific and may be related to hepatocellular disease.  Heterogeneous signal throughout the liver compatible with diffuse hepatic disease.  Splenomegaly.  Limited visualization of the pancreas and aorta.  *RADIOLOGY REPORT*  Clinical Data: Cirrhosis  DUPLEX ULTRASOUND OF LIVER  Technique:  Color and duplex Doppler ultrasound was performed to evaluate the hepatic in-flow and out-flow vessels.  Comparison:  None.  Portal Vein Velocities: Main:      31.9 cm/sec Right:      22.2 cm/sec Left:         32.9 cm/sec  Hepatic Vein Velocities: Right:     Unable to visualize cm/sec Middle:  68.5 cm/sec Left:       52.2 cm/sec  Hepatic Artery Velocity:   104.5 cm/sec Splenic Vein Velocity:      30.9 cm/sec  Varices:   Absent Ascites:   Trace  Findings: Portal vein direction of flow is hepato pedal.  Hepatic vein flow is hepatofugal.  The right hepatic vein could not be identified.  This is likely due to body habitus and technical factors.  Splenic vein is patent.  IMPRESSION: Portal vein as well as the right and left branches are patent with normal direction of flow.  The middle and left hepatic veins are patent with hepatofugal flow. The right hepatic vein could not be visualized likely due to technical factors.  Flow within this branch cannot be confirmed.  Trace ascites.  Original Report Authenticated By: Donavan Burnet, M.D.    Impression/Plan: 65yo with severe aortic stenosis and recent imaging concerning for liver disease. I do not have access to ascitic fluid to know whether that fluid is consistent with portal hypertension and only trace ascitic fluid now preventing a diagnostic tap. Would recommend a 3-phase CT of the abdomen to better evaluate the liver. Outside records of ascitic fluid drained in March would be helpful to know protein levels etc from that. Heme positive stool with near normal Hgb probably due to a rectal outlet source vs. AVMs. No indication to repeat a colonoscopy. An EGD may be needed to look for esophageal or gastric varices and portal gastropathy if CT is unrevealing but I do not think it is necessary for this heme positive stool. Will check AFP, Hep B,C and do CT as next steps. Dr. Evette Cristal will see tomorrow. Thank you for this consultation.    LOS: 7 days   Yoona Ishii C.  03/09/2012, 4:22 PM

## 2012-03-10 LAB — GLUCOSE, CAPILLARY
Glucose-Capillary: 198 mg/dL — ABNORMAL HIGH (ref 70–99)
Glucose-Capillary: 219 mg/dL — ABNORMAL HIGH (ref 70–99)

## 2012-03-10 LAB — HEPATITIS B SURFACE ANTIGEN: Hepatitis B Surface Ag: NEGATIVE

## 2012-03-10 LAB — AFP TUMOR MARKER: AFP-Tumor Marker: 1.4 ng/mL (ref 0.0–8.0)

## 2012-03-10 LAB — BASIC METABOLIC PANEL
Calcium: 10.1 mg/dL (ref 8.4–10.5)
Creatinine, Ser: 0.96 mg/dL (ref 0.50–1.35)
GFR calc Af Amer: >90 mL/min (ref >90)
GFR calc non Af Amer: 86 mL/min — ABNORMAL LOW (ref >90)
Sodium: 136 mEq/L (ref 135–145)

## 2012-03-10 LAB — CBC
MCH: 27.9 pg (ref 26.0–34.0)
MCHC: 32.8 g/dL (ref 30.0–36.0)
MCV: 85.3 fL (ref 78.0–100.0)
Platelets: 135 10*3/uL — ABNORMAL LOW (ref 150–400)
RDW: 14.8 % (ref 11.5–15.5)

## 2012-03-10 LAB — HEPATITIS C ANTIBODY: HCV Ab: NEGATIVE

## 2012-03-10 MED ORDER — POTASSIUM CHLORIDE CRYS ER 15 MEQ PO TBCR: 30.0000 meq | EXTENDED_RELEASE_TABLET | Freq: Two times a day (BID) | ORAL | Status: DC

## 2012-03-10 MED ORDER — LINAGLIPTIN 5 MG PO TABS: 5.0000 mg | ORAL_TABLET | Freq: Every day | ORAL | Status: DC

## 2012-03-10 NOTE — Discharge Summary (Signed)
Physician Discharge Summary  Patient ID: [name] MRN: [id number] DOB/AGE: 64-Sep-1947 64 y.o.  Admit date: 03/02/2012 Discharge date: 03/10/2012  Primary Discharge Diagnosis: 1. Severe  AoV stenosis 2. Chest Pain  Secondary Discharge Diagnosis 1. CAD S/P CABG 2. Hepatic Cirrhosis 3. Atrial Fibrillation 4. Congestive Heart Failure   Significant Diagnostic Studies:  1. TEE 03/07/2012  Left Ventrical: Normal LV function. EF 65%  Mitral Valve: mild MR  Aortic Valve: Mild AI, severe AS  Tricuspid Valve: normal. Trivial TR  Pulmonic Valve: normal  Left Atrium/ Left atrial appendage: no thrombi, seen moderately well  Atrial septum: no ASD by color flow  Aorta: normal  Complications: No apparent complications  Patient did tolerate procedure well.  2. Cardiac Cath 03/07/2012 1. Moderate multivessel coronary artery disease as described  2. Status post multivessel coronary bypass surgery with continued patency of the LIMA to LAD, free radial to obtuse marginal, and saphenous vein graft to diagonal.  3. Vigorous LV systolic function  4. Severe aortic stenosis  Recommendations: Will request cardiac surgery consultation. I will carefully review all of his echo data as well. He will obviously be high risk for consideration of conventional aortic valve replacement considering his previous cardiac surgery, history of GI bleeding, and hepatic cirrhosis. Assess history of GI bleeding and hepatic cirrhosis. and is followed by Dr. Karilyn Cota in [place].  and is followed by Dr. Karilyn Cota in [place]. He was found to have some mild CHF and abdominal distention on admission. He had evidence of diastolic CHF. He has a history of severe aortic valve stenosis he was diuresed with IV Lasix. With a total weight loss of 19 pounds since 03/03/2012.    Consults:  1. Tressie Stalker CVTS 03/08/2012 2. Charlott Rakes, and [place], [place]:  GI 03/10/2012   Hospital Course: [name] is a 66 year old patient of Dr. DDT  and he was admitted with complaints of lower substernal chest discomfort, and epigastric pain. He has a history of atrial fibrillation with controlled rate, CAD with severe native vessel disease and coronary artery bypass grafting. and is followed by Dr. Karilyn Cota in [place]. He was found to have some mild CHF and abdominal distention on admission. He had evidence of diastolic CHF. He has a history of severe aortic valve stenosis he was diuresed with IV Lasix. With a total weight loss of 19 pounds since 03/03/2012.   During hospitalization he had a TEE, to evaluate aortic valve stenosis. This revealed mild AI with severe AS. Therefore, a cardiac catheterization was completed the same day.[name] Results of above described severe aortic valve stenosis. Dr. Horald Chestnut, CVTS was consulted for his opinion I need to have aortic valve repair. It was his recommendation that the discomfort in his chest was chronic and likely multifactorial. He stated that the risks associated with a conventional aortic valve replacement via a redo median sternotomy would be fairly high given the patient's underlying core morbidities, particularly his severe chronic liver disease. He recommended that he have a GI evaluation, with heme positive stools currently, and to review severity of underlying chronic liver disease.    He was seen by Dr. Charlott Rakes, GI, reviewed his records and noted that he had had a history of ascites with fluid drainage [facility] within the last 2 months. He stated that the ultrasound did not show portal hypertension with a liver with diffusely heterogeneous with splenomegaly and trace ascites. He stated there was no indication of repeat colonoscopy. He suggested that an EGD may  be needed to look for esophageal or gastric varices and portal gastropathy. CT scan revealed changes of cirrhosis with mild associated splenomegaly and a small amount of perisplenic ascites. Gallbladder wall thickening was noted and  most likely related to liver disease.     He was seen the following morning by Dr. Evette Cristal, GI. He stated with history of cirrhosis and ascites he should continue to be followed by Dr. Karilyn Cota in [place] for continued assessment and treatment. He did not wish to proceed with any inpatient procedures.     The patient was seen and examined by Dr. Eden Emms on day of discharge. Heart rate was well controlled with reinstitution of verapamil. He was found to be a candidate for Coumadin therapy in the setting of liver cirrhosis and GI bleed. The patient's weight was 250 pounds, he was feeling better, had no further complaints of chest pain or fluid retention, and was anxious to return home. He will followup with Dr. Herold Harms in the office in [facility] for continued cardiology recommendations and treatment. And has been advised to followup with his GI specialist there as well.    Discharge Exam: Blood pressure 127/77, pulse 96, temperature 98.3 F (36.8 C), temperature source Oral, resp. rate 18, height 5\' 8"  (1.727 m), weight 250 lb 11.2 oz (113.717 kg), SpO2 97.00%.   Labs:   Lab Results  Component Value Date   WBC 5.0 03/10/2012   HGB 13.3 03/10/2012   HCT 40.6 03/10/2012   MCV 85.3 03/10/2012   PLT 135* 03/10/2012     Lab 03/10/12 0533 03/09/12 0517  NA 136 --  K 4.7 --  CL 97 --  CO2 25 --  BUN 21 --  CREATININE 0.96 --  CALCIUM 10.1 --  PROT -- 7.9  BILITOT -- 0.6  ALKPHOS -- 106  ALT -- 60*  AST -- 59*  GLUCOSE 181* --   Lab Results  Component Value Date   CKTOTAL 43 03/03/2012   CKMB 1.2 03/03/2012   TROPONINI <0.30 03/03/2012         Radiology: Ct Abdomen Pelvis W Wo Contrast  03/09/2012  *RADIOLOGY REPORT*  Clinical Data: Ascites, cirrhosis.  CT ABDOMEN AND PELVIS WITHOUT AND WITH CONTRAST  Technique:  Multidetector CT imaging of the abdomen and pelvis was performed without contrast material in one or both body regions, followed by contrast material(s) and further sections in one or both  body regions.  Contrast: OMNIPAQUE IOHEXOL 300 MG/ML  SOLN  Comparison: Abdominal ultrasound 09/23/2011  Findings: Heart is borderline in size.  Lung bases are clear.  No effusions.  Fine nodular contours to the liver surface compatible with cirrhosis.  Area of hyperdensity noted on the early arterial phase imaging peripherally in the liver on image 48 of series 3. Overlying retraction of the hepatic capsule.  I suspect this represents alterations in the blood supply or transited hepatic attenuation difference (THAD) rather than true enhancing lesion. On the portal venous phase imaging, no focal abnormality within the liver.  There is mild associated splenomegaly.  Splenic length approximately 14.4 cm.  Mild gallbladder wall thickening noted which is seen on prior ultrasound.  This may be related to liver disease.  Recommend clinical correlation to exclude cholecystitis. There is perisplenic ascites.  Scattered sigmoid diverticula.  Large bowel otherwise unremarkable. Stomach and small bowel grossly unremarkable.  Small umbilical hernia containing fat.  No free air or adenopathy.  Urinary bladder is unremarkable.  Aorta is normal caliber.  IMPRESSION: Changes  of cirrhosis.  Mild associated splenomegaly.  Small amount of perisplenic ascites.  Gallbladder wall thickening which was seen on prior ultrasound, most likely related to liver disease.  Original Report Authenticated By: Cyndie Chime, M.D.   Dg Chest 2 View  03/09/2012  *RADIOLOGY REPORT*  Clinical Data: Rule out congestive heart failure.  CHEST - 2 VIEW  Comparison: 03/02/2012  Findings: The patient is status post median sternotomy and CABG. Heart size is mildly enlarged and stable. Some apical pulmonary vascular redistribution is seen suggesting pulmonary venous hypertensive changes.  The lung fields are clear with no evidence for focal infiltrate or congestive failure.  No pleural fluid or significant peribronchial cuffing is seen.  Calcified  granulomata are again seen in the lower lobes.  Bony structures demonstrate degenerative change of the mid and lower thoracic spine and are stable.  IMPRESSION: Stable cardiopulmonary appearance with no signs of congestive failure identified.  Original Report Authenticated By: Bertha Stakes, M.D.   Ct Chest W Contrast  03/01/2012  *RADIOLOGY REPORT*  Clinical Data: Mediastinal adenopathy follow-up  CT CHEST WITH CONTRAST  Technique:  Multidetector CT imaging of the chest was performed following the standard protocol during bolus administration of intravenous contrast.  Contrast: 75mL OMNIPAQUE IOHEXOL 300 MG/ML  SOLN  Comparison: 08/27/2011  Findings: No axillary or supraclavicular lymph nodes. Again noted are prominent mediastinal and hilar lymph nodes.  The subcarinal lymph node measures 1.1 cm, image 29.  Improved from 1.5 cm.  Left paratracheal lymph node measures 0.8 cm, image 20.  Previously 1.3 cm.  Right hilar lymph node measures 0.8 cm, image number 33. Previously 1.5 cm.  Previous median sternotomy and CABG procedure.  Pulmonary parenchymal nodule in the left upper lobe measures 5.9 mm, image 23.  Unchanged from 08/27/2011 but new from 05/23/2007.  Stable, noncalcified nodule in the right base measures 4.6 mm, image 43. Bilateral lower lobe calcified granulomas noted.  Review of the visualized osseous structures is significant for multilevel thoracic spondylosis.  Several lucent lesions are identified within the lumbar spine.  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  Limited imaging through the upper abdomen is significant for fatty infiltration of the liver.  IMPRESSION:  1.  No acute cardiopulmonary abnormalities. 2.  Interval improvement in mediastinal and hilar adenopathy. 3.  Calcified and noncalcified nodules are likely related to prior granulomatous disease.  Within the left upper lobe there is a 5.9 cm noncalcified nodule which is unchanged from 08/27/2011.  Given the given the  previous smoking history I would advise follow-up imaging to confirm stability of this and other noncalcified nodules. The next follow-up exam should be obtained at 18-24 months.  Original Report Authenticated By: Rosealee Albee, M.D.   US Abdomen Complete  03/09/2012  *RADIOLOGY REPORT*  Clinical Data:  Cirrhosis.  Abdominal distention.  ABDOMINAL ULTRASOUND COMPLETE  Comparison:  IMPRESSION: Portal vein as well as the right and left branches are patent with normal direction of flow.  The middle and left hepatic veins are patent with hepatofugal flow. The right hepatic vein could not be visualized likely due to technical factors.  Flow within this branch cannot be confirmed.  Trace ascites.  Original Report Authenticated By: Donavan Burnet, M.D.   US Abdomen Limited  03/02/2012  *RADIOLOGY REPORT*  Clinical Data: Ascites.  Shortness of breath.  LIMITED ABDOMINAL ULTRASOUND  Comparison:  09/30/2011  Findings: Four-quadrant survey of the abdomen demonstrates no ascites.  IMPRESSION:  Exam negative for ascites. *RADIOLOGY REPORT*  LIMITED ABDOMINAL ULTRASOUND  Ultrasound guided paracentesis was requested for this patient. Ultrasound imaging was performed over all four quadrants of the abdomen without evidence of ascites.  Image documentation was obtained of all four quadrants.  No paracentesis performed at this time.  Impression:  Limited abdominal ultrasound sound revealing no evidence of ascites for paracentesis.  Read by: Anselm Pancoast, P.A.-C  Original Report Authenticated By: Waynard Reeds, M.D.   [place] Art/ven Flow Abd Pelv Doppler  03/09/2012  *RADIOLOGY REPORT*  Clinical Data:  Cirrhosis.  Abdominal distention.  ABDOMINAL ULTRASOUND COMPLETE  Comparison:  09/30/2011  IMPRESSION: Portal vein as well as the right and left branches are patent with normal direction of flow.  The middle and left hepatic veins are patent with hepatofugal flow. The right hepatic vein could not be visualized likely due to  technical factors.  Flow within this branch cannot be confirmed.  Trace ascites.  Original Report Authenticated By: Donavan Burnet, M.D.   Dg Chest Port 1 View  03/02/2012  *RADIOLOGY REPORT*  Clinical Data: Rule out infiltrate  PORTABLE CHEST - 1 VIEW  Comparison: 03/01/2012  Findings: Previous median sternotomy and CABG procedure.  Heart size appears enlarged.  No pleural effusion or edema.  No airspace consolidation.  IMPRESSION:  1.  No acute cardiopulmonary abnormalities.  Original Report Authenticated By: Rosealee Albee, M.D.    EKG: Atrial fibrillation with ICRBB. Rate of 90 bpm  FOLLOW UP PLANS AND APPOINTMENTS Discharge Orders    Future Appointments: Provider: Department: Dept Phone: Center:   05/15/2012 9:30 AM Malissa Hippo, MD [facility] [phone] None   05/22/2012 8:00 AM Radene Gunning [facility] [phone] None   08/21/2012 8:45 AM Dava Najjar Idelle Jo [facility] [phone] None   08/21/2012 9:15 AM Myrtis Ser, NP [facility] [phone] None     Future Orders Please Complete By Expires   Diet - low sodium heart healthy      Increase activity slowly        Medication List  As of 03/10/2012 12:35 PM   STOP taking these medications         potassium chloride 10 MEQ tablet         TAKE these medications         aspirin EC 81 MG tablet   Take 1 tablet (81 mg total) by mouth daily.      atorvastatin 20 MG tablet   Commonly known as: LIPITOR   Take 20 mg by mouth at bedtime.      digoxin 0.25 MG tablet   Commonly known as: LANOXIN   Take 1 tablet (250 mcg total) by mouth daily.      ferrous gluconate 325 MG tablet   Commonly known as: FERGON   Take 325 mg by mouth 3 (three) times daily with meals.      Fish Oil 1200 MG Caps   Take 1,200 mg by mouth 2 (two) times daily.      furosemide 80 MG tablet   Commonly known as: LASIX   Take 80 mg by mouth 2 (two) times daily.      glimepiride 4 MG tablet   Commonly known as:  AMARYL   Take 4 mg by mouth daily with supper.      isosorbide mononitrate 30 MG 24 hr tablet   Commonly known as: IMDUR   Take 1 tablet (30 mg total) by mouth daily.      linagliptin 5  MG Tabs tablet   Commonly known as: TRADJENTA   Take 1 tablet (5 mg total) by mouth daily.      lisinopril 10 MG tablet   Commonly known as: PRINIVIL,ZESTRIL   Take 1 tablet (10 mg total) by mouth daily.      metFORMIN 1000 MG tablet   Commonly known as: GLUCOPHAGE   Take 1,000 mg by mouth 2 (two) times daily with a meal.      metoprolol 50 MG tablet   Commonly known as: LOPRESSOR   Take 1 tablet (50 mg total) by mouth 2 (two) times daily.      multivitamin tablet   Take 1 tablet by mouth daily.      nitroGLYCERIN 0.4 MG SL tablet   Commonly known as: NITROSTAT   Place 1 tablet (0.4 mg total) under the tongue every 5 (five) minutes as needed for chest pain.      nitroGLYCERIN 0.4 MG SL tablet   Commonly known as: NITROSTAT   Place 0.4 mg under the tongue every 5 (five) minutes x 3 doses as needed. For chest pain      omeprazole 20 MG capsule   Commonly known as: PRILOSEC   Take 20 mg by mouth 2 (two) times daily.      potassium chloride SA 15 MEQ tablet   Commonly known as: KLOR-CON M15   Take 2 tablets (30 mEq total) by mouth 2 (two) times daily.      sitaGLIPtin 100 MG tablet   Commonly known as: JANUVIA   Take 100 mg by mouth daily.      verapamil 80 MG tablet   Commonly known as: CALAN   Take 80 mg by mouth 3 (three) times daily.      vitamin C 1000 MG tablet   Take 1,000 mg by mouth 2 (two) times daily.           Follow-up Information    Follow up with Peyton Bottoms, MD. (Office will call you for appointment)    Contact information:   [address] [place] [place] [address] [phone]       Follow up with Malissa Hippo, MD. (Please call his office for appoinment)    Contact information:   [address], [address] [place]  [address] [phone]            Time spent with patient to include physician time: 50 minutes Signed: Joni Reining 03/10/2012, 12:35 PM Co-Sign MD

## 2012-03-10 NOTE — Progress Notes (Signed)
Patient ID: [name], male   DOB: 1946/02/04, 66 y.o.   MRN: [id number]    Subjective:  No further chest pain. Dyspnea stable. No complaints this afternoon.  Objective:  Vital Signs in the last 24 hours: Temp:  [98 F (36.7 C)-98.3 F (36.8 C)] 98.3 F (36.8 C) (08/17 0500) Pulse Rate:  [55-104] 96  (08/17 0955) Resp:  [18] 18  (08/17 0500) BP: (94-127)/(68-77) 127/77 mmHg (08/17 0955) SpO2:  [93 %-98 %] 97 % (08/17 0500) Weight:  [250 lb 11.2 oz (113.717 kg)] 250 lb 11.2 oz (113.717 kg) (08/17 0500)  Intake/Output from previous day: 08/16 0701 - 08/17 0700 In: 400 [P.O.:400] Out: 2975 [Urine:2975]  Physical Exam: Pt is alert and oriented, pleasant male in NAD HEENT: normal Neck: JVP - normal Lungs: CTA bilaterally CV: irregularly irregular with grade 2/6 harsh systolic murmur at the LSB Abd: soft, NT, Positive BS Ext: no C/C/E, distal pulses intact and equal Skin: warm/dry no rash   Lab Results:  Basename 03/10/12 0533 03/09/12 0517  WBC 5.0 5.7  HGB 13.3 12.8*  PLT 135* 129*    Basename 03/10/12 0533 03/09/12 0517  NA 136 137  K 4.7 5.0  CL 97 100  CO2 25 25  GLUCOSE 181* 187*  BUN 21 20  CREATININE 0.96 0.89   No results found for this basename: TROPONINI:2,CK,MB:2 in the last 72 hours  Tele: atrial fibrillation heart rate 80-100 bpm  Assessment/Plan:  1. Severe aortic stenosis 2. Hepatic cirrhosis with chronic ascites 3. CAD s/p CABG - patent grafts by cath this admission 4. 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 EGD.  Suspect final decision regarding TAVR or AVR can be made as outpatient .  If no further inpatient GI w/u needs to be done can probably be D/C home With outpatient F/U Excell Seltzer and Cornelius Moras.  GI  Please call PA Lorin Picket for possible D/C if you do not plan on any further inpatient w/u  [phone] Charlton Haws, M.D. 03/10/2012, 10:37 AM

## 2012-03-10 NOTE — Progress Notes (Signed)
No chest pain.[name] He has a history of cirrhosis with ascites. He has been followed by Dr Karilyn Cota in [place] who is his gastroenterologist and knows him well. I see no indication for further inpatient GI evaluation at this time. Cardiology feels that he can go home. I'm in agreement with that and he should follow up with Dr Karilyn Cota his gastroenterologist in [place] in regards to his cirrhosis.

## 2012-03-27 ENCOUNTER — Encounter: Payer: Self-pay | Admitting: Thoracic Surgery (Cardiothoracic Vascular Surgery)

## 2012-03-27 ENCOUNTER — Institutional Professional Consult (permissible substitution) (INDEPENDENT_AMBULATORY_CARE_PROVIDER_SITE_OTHER): Payer: Managed Care, Other (non HMO) | Admitting: Thoracic Surgery (Cardiothoracic Vascular Surgery)

## 2012-03-27 VITALS — BP 137/57 | HR 69 | Resp 16 | Ht 68.0 in | Wt 255.0 lb

## 2012-03-27 DIAGNOSIS — I2510 Atherosclerotic heart disease of native coronary artery without angina pectoris: Secondary | ICD-10-CM

## 2012-03-27 DIAGNOSIS — I350 Nonrheumatic aortic (valve) stenosis: Secondary | ICD-10-CM

## 2012-03-27 DIAGNOSIS — I359 Nonrheumatic aortic valve disorder, unspecified: Secondary | ICD-10-CM

## 2012-03-27 NOTE — Progress Notes (Signed)
PCP is MEYERS, STEPHEN, MD Referring Provider is Cooper, Michael, MD  Chief Complaint  Patient presents with  . Coronary Artery Disease    eval for surgery  . Aortic Stenosis    HPI: 65-year-old gentleman with a history of coronary disease and aortic stenosis who was recently admitted after an episode of chest pain. [name] primary complaint today is shortness of breath with exertion.  [name] is a 65-year-old gentleman who had coronary bypass grafting x3 in 2004. [name] has multiple medical problems including atrial fibrillation, congestive heart failure, cirrhosis, thrombocytopenia, arthritis, and frequent gastrointestinal bleeds. [name] had been having difficulty with shortness of breath with exertion which has been treated with paracentesis in [place] about a month ago. [name] shortness of breath improved significantly after that procedure was done, but did not resolve entirely. In mid August [name] had an episode of chest pressure and went to the hospital. [name] was transferred to [facility] for further evaluation. [name] had cardiac catheterization which showed patent grafts and moderate three-vessel disease. [name] also had a TEE which showed normal LV function with ejection fraction of 65%, severe left ear with mild AI and mild [name] also had an abdominal ultrasound which showed some evidence of cirrhosis, but no evidence of portal hypertension and only trace ascites. [name] improved symptomatically with diuresis and was discharged. [name] now returns to further discuss treatment of [name] aortic stenosis.  Other than the one showed noted above [name] has not had chest pain. [name] does have shortness of breath with exertion. [name] finds is very frustrating and is not able to engage in all the activities [name] would like to do. [name] is not currently having orthopnea, PND or peripheral edema.  Dating back to January of 2012 [name] has had frequent gastrointestinal bleeding episodes. [name] required 4 units of blood for a bleed in January of that year. [name] then  had 9- 10 hospitalizations since then for recurrent gastrointestinal bleeds. [name]'s had extensive workup with scans and endoscopies with no site identified.  Past Medical History  Diagnosis Date  . Overweight   . Coronary atherosclerosis of artery bypass graft   . Atrial fibrillation     Permanent; off of Coumadin for now due to GI bleed  . Shortness of breath   . Type II or unspecified type diabetes mellitus without mention of complication, not stated as uncontrolled   . OSA (obstructive sleep apnea)   . Insomnia   . Carotid bruit 06/14/2011  . Angina   . Hypertension   . CHF (congestive heart failure)   . GERD (gastroesophageal reflux disease)   . COPD (chronic obstructive pulmonary disease)   . Iron deficiency anemia     Requiring intravenous iron  . H/O hiatal hernia   . AVM (arteriovenous malformation)     Recurrent GI bleeding requiring multiple transfusions  . Hyperlipidemia   . Thrombocytopenia   . Ascites     status post paracentesis with removal of 3.4 L of ascitic fluid  . Osteoarthritis   . Mediastinal adenopathy 09/22/2011  . Cirrhosis   . Iron deficiency anemia   . Mediastinal adenopathy 09/22/2011  . Chronic diastolic CHF (congestive heart failure) 03/02/2012  . Ascites 09/20/2011  . Permanent atrial fibrillation 03/02/2012  . Aortic stenosis 03/08/2012  . 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   Coronary artery bypass graft  10/15/2002     Jaziya Obarr C. Letica Giaimo, M.D.     . Carpal tunnel release   10/08/2003  . Lipoma surgery   . Hernia repair   . Other surgical history 08/26/2011    [facility],  enteroscopy , revealing "three-four AVMs."   . Tee without cardioversion 03/07/2012    Procedure: TRANSESOPHAGEAL ECHOCARDIOGRAM (TEE);  Surgeon: Philip J Nahser, MD;  Location: [facility] ENDOSCOPY;  Service: Cardiovascular;   Laterality: N/A;  . Coronary angioplasty with stent placement 01/19/2005    drug eluting stent to high grade ostial stenosis of radial artery graft to OM    Family History  Problem Relation Age of Onset  . Coronary artery disease      FAMILY HISTORY  . Hypertension Father   . Diabetes Father   . Heart disease Father   . COPD Sister   . Cancer Maternal Aunt     Breast cancer   . Cancer Maternal Aunt     Breast cancer   . Diabetes Son   . Cancer Daughter     Cervical cancer    Social History History  Substance Use Topics  . Smoking status: Former Smoker -- 1.0 packs/day for 30 years    Types: Cigarettes    Quit date: 07/25/2000  . Smokeless tobacco: Never Used  . Alcohol Use: 0.6 oz/week    1 Shots of liquor per week     Occasionally    Current Outpatient Prescriptions  Medication Sig Dispense Refill  . Ascorbic Acid (VITAMIN C) 1000 MG tablet Take 1,000 mg by mouth 2 (two) times daily.      . aspirin EC 81 MG tablet Take 1 tablet (81 mg total) by mouth daily.      . atorvastatin (LIPITOR) 20 MG tablet Take 20 mg by mouth at bedtime.       . digoxin (LANOXIN) 0.25 MG tablet Take 1 tablet (250 mcg total) by mouth daily.  90 tablet  3  . ferrous gluconate (FERGON) 325 MG tablet Take 325 mg by mouth 3 (three) times daily with meals.      . furosemide (LASIX) 80 MG tablet Take 80 mg by mouth 2 (two) times daily.      . glimepiride (AMARYL) 4 MG tablet Take 4 mg by mouth daily with supper.       . isosorbide mononitrate (IMDUR) 30 MG 24 hr tablet Take 1 tablet (30 mg total) by mouth daily.  30 tablet  11  . linagliptin (TRADJENTA) 5 MG TABS tablet Take 1 tablet (5 mg total) by mouth daily.  30 tablet  6  . lisinopril (PRINIVIL,ZESTRIL) 10 MG tablet Take 1 tablet (10 mg total) by mouth daily.  90 tablet  3  . metFORMIN (GLUCOPHAGE) 1000 MG tablet Take 1,000 mg by mouth 2 (two) times daily with a meal.       . metoprolol (LOPRESSOR) 50 MG tablet Take 1 tablet (50 mg total) by  mouth 2 (two) times daily.  270 tablet  3  . Multiple Vitamin (MULTIVITAMIN) tablet Take 1 tablet by mouth daily.       . nitroGLYCERIN (NITROSTAT) 0.4 MG SL tablet Place 0.4 mg under the tongue every 5 (five) minutes x 3 doses as needed. For chest pain      . Omega-3 Fatty Acids (FISH OIL) 1200 MG CAPS Take 1,200 mg by mouth 2 (two) times daily.       . omeprazole (PRILOSEC) 20 MG capsule Take   20 mg by mouth 2 (two) times daily.       . potassium chloride (KLOR-CON M15) 15 MEQ tablet Take 2 tablets (30 mEq total) by mouth 2 (two) times daily.  60 tablet  6  . sitaGLIPtan (JANUVIA) 100 MG tablet Take 100 mg by mouth daily.       . verapamil (CALAN) 80 MG tablet Take 80 mg by mouth 3 (three) times daily.      . nitroGLYCERIN (NITROSTAT) 0.4 MG SL tablet Place 1 tablet (0.4 mg total) under the tongue every 5 (five) minutes as needed for chest pain.  25 tablet  3    Allergies  Allergen Reactions  . Codeine Other (See Comments)    Hurting in chest  . Diltiazem Hcl Itching  . Niacin Other (See Comments)    Hot flashes    Review of Systems  Constitutional: Positive for activity change and fatigue.  HENT: Positive for hearing loss.   Respiratory: Positive for shortness of breath.   Cardiovascular: Positive for chest pain (pressure on one occasion leading to recent hospitalization).       Chronic atrial fibrillation  Gastrointestinal: Positive for abdominal distention.       Recurrent GI bleeds, no source identified, melena  Genitourinary: Positive for frequency.  Musculoskeletal: Positive for arthralgias.  Neurological: Positive for dizziness.  Hematological: Bruises/bleeds easily.  All other systems reviewed and are negative.    BP 137/57  Pulse 69  Resp 16  Ht 5' 8" (1.727 m)  Wt 255 lb (115.667 kg)  BMI 38.77 kg/m2  SpO2 97% Physical Exam  Constitutional: [name] is oriented to person, place, and time.       Obese   HENT:  Head: Normocephalic and atraumatic.  Eyes: EOM are  normal. Pupils are equal, round, and reactive to light.  Neck: Neck supple. No thyromegaly present.       + bruits vs. transmitted murmur bilaterally  Cardiovascular: Intact distal pulses.   Murmur (3/6 systolic) heard.      Irregularly, irregular  Pulmonary/Chest: Effort normal.       Diminished BS in bases  Abdominal: Soft. There is no tenderness.       distended  Musculoskeletal: [name] exhibits no edema.  Lymphadenopathy:    [name] has no cervical adenopathy.  Neurological: [name] is alert and oriented to person, place, and time.  Skin: Skin is warm and dry.     Diagnostic Tests: Cardiac catheterization as previously noted  Transesophageal echocardiogram as previously noted  Albumin 3.9, total protein 7.9 AST 59, ALT 60, total bili 0.6 Hemoglobin 13.3 hematocrit 40.6 Platelets 135,000  Pulmonary function testing 03/21/11 FVC 3.23, 82% FEV1 2.37, 77% TLC 5.24, 85% DLCO 21.5, 74%  Impression: 65-year-old gentleman with history of coronary disease who now has severe aortic stenosis. [name] has a complex past medical history and multiple chronic medical problems. [name] aortic stenosis progressed to the point that it is symptomatic and requires intervention. The question is whether [name] would be a candidate for redo sternotomy for replacement or whether a percutaneous approach would be preferred. [name] certainly would be relatively high risk for open surgery given [name] comorbidities and previous bypasses. However, it appears that there is no contraindication to open surgery unless [name] gastroenterologist feels that [name] cirrhosis is severe enough to preclude the possibility. We're awaiting [name] appointment with Dr. Rehman in [place] for further information in that regard. There may be a combination of factors including [name] aortic stenosis, body habitus, and cirrhosis/ascites contributing   to [name] shortness of breath. However it appears that the aortic stenosis is the primary issue.  [name] would not be a candidate  for a mechanical valve given [name] frequent GI bleeding. However [name] would be a candidate for tissue valve would be done open or percutaneously. 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 This is not uncommon association and the gastrointestinal bleeding often decreases or resolves after aortic valve replacement. [name] has wife understand there is no way that we can be sure of that outcome.  Again the key issue in regards to [name] risk of surgery really is the risk related to [name] cirrhosis. With no evidence of portal hypertension and a normal albumin I suspect [name] risk is acceptable, but will await Dr. Rehman's opinion prior to making a final determination.   Plan: Patient has an appointment to see Dr. Rehman on 04/17/2012. We will contact [name] office to see if we can move that up at all.  I will plan to see him back in the office after that assessment to finalize our plans regarding treatment of [name] aortic stenosis. 

## 2012-04-05 ENCOUNTER — Ambulatory Visit (INDEPENDENT_AMBULATORY_CARE_PROVIDER_SITE_OTHER): Payer: Managed Care, Other (non HMO) | Admitting: Internal Medicine

## 2012-04-05 ENCOUNTER — Encounter (INDEPENDENT_AMBULATORY_CARE_PROVIDER_SITE_OTHER): Payer: Self-pay | Admitting: Internal Medicine

## 2012-04-05 VITALS — BP 110/70 | HR 74 | Temp 98.3°F | Resp 18 | Ht 68.0 in | Wt 262.8 lb

## 2012-04-05 DIAGNOSIS — K7460 Unspecified cirrhosis of liver: Secondary | ICD-10-CM

## 2012-04-05 DIAGNOSIS — Z01818 Encounter for other preprocedural examination: Secondary | ICD-10-CM

## 2012-04-05 NOTE — Patient Instructions (Signed)
No changes made in medications today. Call if you have any questions.

## 2012-04-05 NOTE — Progress Notes (Signed)
Presenting complaint;  Preop evaluation in a patient with known cirrhosis secondary to NAFLD.  Subjective:  Patient is 66 year old Caucasian male who has cirrhosis secondary to NAFLD. He was initially seen by me in March this year when he was found to have new onset of ascites and underwent abdominal paracenteses. All is markers for chronic liver disease including hep B and C. were negative. 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 With diuretic therapy his weight has remained between 262 and 265 pounds. Spironolactone was discontinued because of painful gynecomastia. He was hospitalized at [facility] from [address] 03/09/2012 because of chest pain and CHF. He was diuresed. Evaluation revealed him to have severe aortic stenosis. He was also evaluated by Dr. Charlett Lango and it was determined that he would need valve replacement. He was suspected to have ascites and ultrasound guided paracenteses was planned but no fluid was identified. Abdomino-pelvic CT similarly failed to show ascites. Because of chronic liver disease Dr. Dorris Fetch wanted to be evaluated to determine his risk prior to surgery. He recommended tissue valve so that he would not need chronic anticoagulation given history of significant GI bleed secondary to GI angiodysplasia. Today patient is accompanied by his wife. He has only lost 1 pound since his last visit her office on 02/13/2012. His main complaint is that he cannot function because he gets short of breath with minimal exertion. He is not sure if he has gained any weight since he left the hospital. Patient denies abdominal pain nausea vomiting melena or rectal bleeding. There is also no history of confusion or somnolence.   Current Medications: Current Outpatient Prescriptions  Medication Sig Dispense Refill  . Ascorbic Acid (VITAMIN C) 1000 MG tablet Take 1,000 mg by mouth 2 (two) times daily.      [name] aspirin EC 81 MG tablet  Take 1 tablet (81 mg total) by mouth daily.      [name] atorvastatin (LIPITOR) 20 MG tablet Take 20 mg by mouth at bedtime.       . digoxin (LANOXIN) 0.25 MG tablet Take 1 tablet (250 mcg total) by mouth daily.  90 tablet  3  . ferrous gluconate (FERGON) 325 MG tablet Take 325 mg by mouth 3 (three) times daily with meals.      . furosemide (LASIX) 80 MG tablet Take 80 mg by mouth 2 (two) times daily.      [name] glimepiride (AMARYL) 4 MG tablet Take 4 mg by mouth daily with supper.       . isosorbide mononitrate (IMDUR) 30 MG 24 hr tablet Take 1 tablet (30 mg total) by mouth daily.  30 tablet  11  . linagliptin (TRADJENTA) 5 MG TABS tablet Take 1 tablet (5 mg total) by mouth daily.  30 tablet  6  . lisinopril (PRINIVIL,ZESTRIL) 10 MG tablet Take 1 tablet (10 mg total) by mouth daily.  90 tablet  3  . metFORMIN (GLUCOPHAGE) 1000 MG tablet Take 1,000 mg by mouth 2 (two) times daily with a meal.       . metoprolol (LOPRESSOR) 50 MG tablet Take 1 tablet (50 mg total) by mouth 2 (two) times daily.  270 tablet  3  . Multiple Vitamin (MULTIVITAMIN) tablet Take 1 tablet by mouth daily.       . nitroGLYCERIN (NITROSTAT) 0.4 MG SL tablet Place 0.4 mg under the tongue every 5 (five) minutes x 3 doses as needed. For chest pain      . Omega-3 Fatty  Acids (FISH OIL) 1200 MG CAPS Take 1,200 mg by mouth 2 (two) times daily.       [name] omeprazole (PRILOSEC) 20 MG capsule Take 20 mg by mouth 2 (two) times daily.       . potassium chloride (KLOR-CON M15) 15 MEQ tablet Take 2 tablets (30 mEq total) by mouth 2 (two) times daily.  60 tablet  6  . sitaGLIPtan (JANUVIA) 100 MG tablet Take 100 mg by mouth daily.       . verapamil (CALAN) 80 MG tablet Take 80 mg by mouth 3 (three) times daily.      . nitroGLYCERIN (NITROSTAT) 0.4 MG SL tablet Place 1 tablet (0.4 mg total) under the tongue every 5 (five) minutes as needed for chest pain.  25 tablet  3     Objective: Blood pressure 110/70, pulse 74, temperature 98.3 F (36.8 C),  temperature source Oral, resp. rate 18, height 5\' 8"  (1.727 m), weight 262 lb 12.8 oz (119.205 kg). Patient is alert and in no acute distress. Asterixis is absent. Conjunctiva is pink. Sclera is nonicteric Oropharyngeal mucosa is normal. No neck masses or thyromegaly noted. Cardiac exam with irregular rhythm normal S1 and S2. He has grade 2/6 systolic ejection murmur best heard at aortic area. Lungs are clear to auscultation. Abdomen is obese. Abdomen is soft with easily palpable and enlarged liver prominent left lobe with irregular surface. Spleen is not palpable. Shifting dullness is absent. No LE edema or clubbing noted.   Labs/studies Results:  Lab from 03/10/2012. WBC 5.0, H&H 13.3 and 40.6 and platelet count 135K. Alpha-fetoprotein 1.4 LFTs from 03/09/2012. Bilirubin  0.6, AP 106, AST 59, ALT 60, total protein 7.9 and albumin of 3.6. Reason abdominopelvic CT reviewed  with patient. Serum creatinine 0.78. INR 1.06 on 03/02/2012.     Assessment:  #1. Cirrhosis. At the present time he appears to have compensated disease. While he did have ascites back in March this year recent CT and ultrasound were negative for ascites. His CHP score is 6 and MELD score is also low. Since ascites has completely resolved initial episode would appear to be multifactorial and not primarily due to liver disease. Therefore risk for hepatic decompensation postop would be low. I believe surgery may improve his long-term prognosis as far as his liver disease is concerned as he needs to be more active and bring his BMI down and he cannot do it unless aortic stenosis is treated. #2. History of significant GI bleed well documented to be due to GI angiodysplasia. He has not required transfusion since he's been off warfarin. I would therefore agree the idea of using tissue valve rather than mechanical valve so that he would not have to be anticoagulated chronically. It is quite possible that his GI angiodysplasia  may resolve once aortic stenosis is treated. Prior EGDs have been negative for esophageal varices and I do not believe that he needs another EGD preoperatively.   Recommendations; I do not see contraindication to proceeding with surgery for severe aortic stenosis with the understanding that there is some risk for hepatic decompensation. Patient and his wife understand that there is low but definite risk for hepatic decompensation particularly if perioperative course is prolonged or complicated. If short-term perioperative anti-coagulation is needed he would just need to be watched very closely for active GI bleed. Plan to see patient in office in 3 months or earlier if needed.      Copy to Dr. Viviann Spare C. Hendrickson.

## 2012-04-11 ENCOUNTER — Ambulatory Visit (INDEPENDENT_AMBULATORY_CARE_PROVIDER_SITE_OTHER): Payer: Managed Care, Other (non HMO) | Admitting: Thoracic Surgery (Cardiothoracic Vascular Surgery)

## 2012-04-11 ENCOUNTER — Other Ambulatory Visit: Payer: Self-pay | Admitting: Thoracic Surgery (Cardiothoracic Vascular Surgery)

## 2012-04-11 ENCOUNTER — Encounter: Payer: Self-pay | Admitting: Thoracic Surgery (Cardiothoracic Vascular Surgery)

## 2012-04-11 ENCOUNTER — Other Ambulatory Visit: Payer: Self-pay | Admitting: *Deleted

## 2012-04-11 VITALS — BP 134/68 | HR 68 | Resp 16 | Ht 68.0 in | Wt 264.0 lb

## 2012-04-11 DIAGNOSIS — I359 Nonrheumatic aortic valve disorder, unspecified: Secondary | ICD-10-CM

## 2012-04-11 DIAGNOSIS — I2510 Atherosclerotic heart disease of native coronary artery without angina pectoris: Secondary | ICD-10-CM

## 2012-04-11 DIAGNOSIS — I350 Nonrheumatic aortic (valve) stenosis: Secondary | ICD-10-CM

## 2012-04-11 NOTE — Progress Notes (Signed)
HPI:  [name] returns today to further discuss possibility of a redo sternotomy for aortic valve replacement for treatment of his aortic stenosis. He is a 66 year old gentleman who had coronary bypass grafting in 2004. He also has a history of congestive heart failure, recurrent GI bleeds, diabetes, obesity, and cirrhosis of the liver (with ascites earlier this year). He was hospitalized in August with prolonged chest pain. Workup included echocardiography and cardiac catheterization. Transthoracic and transesophageal echo both confirmed severe aortic stenosis and mild aortic insufficiency with normal left ventricular function. He was found to have severe aortic stenosis with a valve area estimated at 0.76 cm by catheterization with a mean gradient of 42 mm Hg. He had normal left ventricle systolic function, normal right heart cath pressures and all 3 grafts were patent.  Given his history of cirrhosis we felt it was important to have him evaluated by gastroenterology to ensure that he was not excessive perioperative risk. He saw Dr. Karilyn Cota last week who felt that his perioperative risk was acceptable as regards his cirrhosis, which is well compensated at this point in time.  Past Medical History  Diagnosis Date  . Overweight   . Coronary atherosclerosis of artery bypass graft   . Atrial fibrillation     Permanent; off of Coumadin for now due to GI bleed  . Shortness of breath   . Type II or unspecified type diabetes mellitus without mention of complication, not stated as uncontrolled   . OSA (obstructive sleep apnea)   . Insomnia   . Carotid bruit 06/14/2011  . Angina   . Hypertension   . CHF (congestive heart failure)   . GERD (gastroesophageal reflux disease)   . COPD (chronic obstructive pulmonary disease)   . Iron deficiency anemia     Requiring intravenous iron  . H/O hiatal hernia   . AVM (arteriovenous malformation)     Recurrent GI bleeding requiring multiple transfusions  .  Hyperlipidemia   . Thrombocytopenia   . Ascites     status post paracentesis with removal of 3.4 L of ascitic fluid  . Osteoarthritis   . Mediastinal adenopathy 09/22/2011  . Cirrhosis   . Iron deficiency anemia   . Mediastinal adenopathy 09/22/2011  . Chronic diastolic CHF (congestive heart failure) 03/02/2012  . Ascites 09/20/2011  . Permanent atrial fibrillation 03/02/2012  . Aortic stenosis 03/08/2012  . 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 Ascorbic Acid (VITAMIN C) 1000 MG tablet Take 1,000 mg by mouth 2 (two) times daily.      [name] aspirin EC 81 MG tablet Take 1 tablet (81 mg total) by mouth daily.      [name] atorvastatin (LIPITOR) 20 MG tablet Take 20 mg by mouth at bedtime.       . digoxin (LANOXIN) 0.25 MG tablet Take 1 tablet (250 mcg total) by mouth daily.  90 tablet  3  . ferrous gluconate (FERGON) 325 MG tablet Take 325 mg by mouth 3 (three) times daily with meals.      . furosemide (LASIX) 80 MG tablet Take 80 mg by mouth 2 (two) times daily.      [name] glimepiride (AMARYL) 4 MG tablet Take 4 mg by mouth daily with supper.       . isosorbide mononitrate (IMDUR)  30 MG 24 hr tablet Take 1 tablet (30 mg total) by mouth daily.  30 tablet  11  . linagliptin (TRADJENTA) 5 MG TABS tablet Take 1 tablet (5 mg total) by mouth daily.  30 tablet  6  . lisinopril (PRINIVIL,ZESTRIL) 10 MG tablet Take 1 tablet (10 mg total) by mouth daily.  90 tablet  3  . metFORMIN (GLUCOPHAGE) 1000 MG tablet Take 1,000 mg by mouth 2 (two) times daily with a meal.       . metoprolol (LOPRESSOR) 50 MG tablet Take 1 tablet (50 mg total) by mouth 2 (two) times daily.  270 tablet  3  . Multiple Vitamin (MULTIVITAMIN) tablet Take 1 tablet by mouth daily.       . nitroGLYCERIN (NITROSTAT) 0.4 MG SL tablet Place 0.4 mg under the tongue every 5  (five) minutes x 3 doses as needed. For chest pain      . Omega-3 Fatty Acids (FISH OIL) 1200 MG CAPS Take 1,200 mg by mouth 2 (two) times daily.       [name] omeprazole (PRILOSEC) 20 MG capsule Take 20 mg by mouth 2 (two) times daily.       . potassium chloride (KLOR-CON M15) 15 MEQ tablet Take 2 tablets (30 mEq total) by mouth 2 (two) times daily.  60 tablet  6  . sitaGLIPtan (JANUVIA) 100 MG tablet Take 100 mg by mouth daily.       . verapamil (CALAN) 80 MG tablet Take 80 mg by mouth 3 (three) times daily.      . nitroGLYCERIN (NITROSTAT) 0.4 MG SL tablet Place 1 tablet (0.4 mg total) under the tongue every 5 (five) minutes as needed for chest pain.  25 tablet  3    Physical Exam  Constitutional: He is oriented to person, place, and time. No distress.       Obese  HENT:  Head: Normocephalic and atraumatic.       Dentures both upper and lower  Eyes: EOM are normal. Pupils are equal, round, and reactive to light.  Neck: Neck supple. No thyromegaly present.  Cardiovascular: Normal rate and regular rhythm.   Murmur (3/6 systolic crescendo decrescendo) heard. Pulmonary/Chest: Effort normal. He has no wheezes. He has no rales.       Diminished breath sounds bilaterally  Abdominal: Soft. There is no tenderness.       Obese  Musculoskeletal: He exhibits no edema.  Lymphadenopathy:    He has no cervical adenopathy.  Neurological: He is alert and oriented to person, place, and time. No cranial nerve deficit.  Skin: Skin is warm and dry.    Diagnostic Tests:  Transthoracic Echocardiography  (Report amended )  Patient: [name], [name] MR #: [id number] Study Date: 03/05/2012 Gender: M Age: 38 Height: 172.7cm Weight: 117kg BSA: 2.6m^2 Pt. Status: Room: 3W39C  [name] REFERRING [name] ATTENDING [name] PERFORMING [place], [facility] SONOGRAPHER [name], RDCS cc:  ------------------------------------------------------------ LV EF: 65% -  70%  ------------------------------------------------------------ Indications: CHF - 428.0.  ------------------------------------------------------------ History: Risk factors: Hypertension. Diabetes mellitus. Obese. Dyslipidemia.  ------------------------------------------------------------ Study Conclusions  - Left ventricle: The cavity size was normal. Wall thickness was normal. Systolic function was vigorous. The estimated ejection fraction was in the range of 65% to 70%. - Aortic valve: AV is thickened, calcified with restricted motion. Peak and mean gradients through the valve are 76 and 45 mm Hg respectively consistent with severe AS. consider TEE to define further as difficult to see well. Trivial  regurgitation. - Mitral valve: Mild regurgitation.  ------------------------------------------------------------ Labs, prior tests, procedures, and surgery: Coronary artery bypass grafting.  Transthoracic echocardiography. M-mode, complete 2D, spectral Doppler, and color Doppler. Height: Height: 172.7cm. Height: 68in. Weight: Weight: 117kg. Weight: 257.4lb. Body mass index: BMI: 39.2kg/m^2. Body surface area: BSA: 2.4m^2. Blood pressure: 122/78. Patient status: Inpatient. Location: Bedside.  ------------------------------------------------------------  ------------------------------------------------------------ Left ventricle: The cavity size was normal. Wall thickness was normal. Systolic function was vigorous. The estimated ejection fraction was in the range of 65% to 70%.  ------------------------------------------------------------ Aortic valve: AV is thickened, calcified with restricted motion. Peak and mean gradients through the valve are 76 and 45 mm Hg respectively consistent with severe AS. consider TEE to define further as difficult to see well. Doppler: Trivial regurgitation. VTI ratio of LVOT to aortic valve: 0.38. Valve area: 1.07cm^2(VTI). Indexed valve  area: 0.47cm^2/m^2 (VTI). Peak velocity ratio of LVOT to aortic valve: 0.33. Valve area: 0.92cm^2 (Vmax). Indexed valve area: 0.4cm^2/m^2 (Vmax). Mean gradient: 39mm Hg (S). Peak gradient: 75mm Hg (S).  ------------------------------------------------------------ Mitral valve: Mildly thickened leaflets . Doppler: Mild regurgitation. Peak gradient: 3mm Hg (D).  ------------------------------------------------------------ Left atrium: The atrium was normal in size.  ------------------------------------------------------------ Right ventricle: The cavity size was normal. Wall thickness was normal. Systolic function was normal.  ------------------------------------------------------------ Tricuspid valve: Structurally normal valve. Leaflet separation was normal. Doppler: Transvalvular velocity was within the normal range. No regurgitation.  ------------------------------------------------------------ Right atrium: The atrium was normal in size.  ------------------------------------------------------------ Pericardium: There was no pericardial effusion.  ------------------------------------------------------------  2D measurements Normal Doppler measurements Normal Left ventricle Left ventricle LVID ED, 49.4 mm 43-52 Ea, lat 12 cm/s ------ chord, ann, tiss PLAX DP LVID ES, 29.9 mm 23-38 E/Ea, lat 7.69 ------ chord, ann, tiss PLAX DP FS, chord, 39 % >29 LVOT PLAX Peak vel, 141 cm/s ------ LVPW, ED 10.9 mm ------ S IVS/LVPW 1.04 <1.3 VTI, S 26.6 cm ------ ratio, ED Peak 8 mm Hg ------ Ventricular septum gradient, IVS, ED 11.3 mm ------ S LVOT Aortic valve Diam, S 19 mm ------ Peak vel, 433 cm/s ------ Area 2.84 cm^2 ------ S Aorta Mean vel, 284 cm/s ------ Root diam, 34 mm ------ S ED VTI, S 70.9 cm ------ Left atrium Mean 39 mm Hg ------ AP dim 35 mm ------ gradient, AP dim 1.54 cm/m^2 <2.2 S index Peak 75 mm Hg ------ gradient, S VTI ratio 0.38  ------ LVOT/AV Area, VTI 1.07 cm^2 ------ Area index 0.47 cm^2/m ------ (VTI) ^2 Peak vel 0.33 ------ ratio, LVOT/AV Area, Vmax 0.92 cm^2 ------ Area index 0.4 cm^2/m ------ (Vmax) ^2 Mitral valve Peak E vel 92.3 cm/s ------ Peak A vel 50.3 cm/s ------ Decelerati 213 ms 150-23 on time 0 Peak 3 mm Hg ------ gradient, D Peak E/A 1.8 ------ ratio  ------------------------------------------------------------ Lanetta Inch, Paula 2013-08-12T12:50:18.583  Transesophageal Echocardiography  Patient: [name], [name] MR #: [id number] Study Date: 03/07/2012 Gender: M Age: 47 Height: 172.7cm Weight: 116.4kg BSA: 2.54m^2 Pt. Status: Room: 3W39C  PERFORMING [place], Arlice Colt ATTENDING [name] SONOGRAPHER Silvano Bilis, RCS [facility], [facility] [place], [place] cc:  ------------------------------------------------------------ LV EF: 50% - 55%  ------------------------------------------------------------ Indications: Aortic stenosis 424.1.  ------------------------------------------------------------ History: PMH: Dyspnea. Atrial fibrillation. Coronary artery disease. Congestive heart failure. Risk factors: Hypertension. Dyslipidemia.  ------------------------------------------------------------ Study Conclusions  - Left ventricle: Systolic function was normal. The estimated ejection fraction was in the range of 50% to 55%. - Aortic valve: There was severe stenosis. Mild regurgitation. - Mitral valve: Mild regurgitation. - Left atrium:  No evidence of thrombus in the atrial cavity or appendage. - Right atrium: No evidence of thrombus in the atrial cavity or appendage. - Atrial septum: No defect or patent foramen ovale was identified. Transesophageal echocardiography. 2D and color Doppler. Height: Height: 172.7cm. Height: 68in. Weight: Weight: 116.4kg. Weight: 256lb. Body mass index: BMI: 39kg/m^2. Body surface area: BSA:  2.73m^2. Blood pressure: 95/52. Patient status: Inpatient. Location: Endoscopy.  ------------------------------------------------------------  ------------------------------------------------------------ Left ventricle: Systolic function was normal. The estimated ejection fraction was in the range of 50% to 55%.  ------------------------------------------------------------ Aortic valve: There appears to be fusion of the noncoronary cusp and right coronary cusp. Moderately thickened, severely calcified leaflets. Doppler: There was severe stenosis. Mild regurgitation.  ------------------------------------------------------------ Aorta: The aorta was normal, not dilated, and non-diseased.  ------------------------------------------------------------ Mitral valve: Doppler: Mild regurgitation.  ------------------------------------------------------------ Left atrium: The atrium was normal in size. No evidence of thrombus in the atrial cavity or appendage.  ------------------------------------------------------------ Atrial septum: No defect or patent foramen ovale was identified.  ------------------------------------------------------------ Right ventricle: The cavity size was normal. Wall thickness was normal. Systolic function was normal.  ------------------------------------------------------------ Pulmonic valve: Doppler: No significant regurgitation.  ------------------------------------------------------------ Tricuspid valve: Doppler: Trivial regurgitation.  ------------------------------------------------------------ Right atrium: The atrium was normal in size. No evidence of thrombus in the atrial cavity or appendage.  ------------------------------------------------------------ Post procedure conclusions Ascending Aorta:  - The aorta was normal, not dilated, and non-diseased.  ------------------------------------------------------------ Prepared and  Electronically Authenticated by  Kristeen Miss 2013-08-14T11:59:10.677  Cardiac Catheterization Procedure Note  Name: [name]  MRN: [id number]  DOB: 08/18/1945  Procedure: Right Heart Cath, Left Heart Cath, Selective Coronary Angiography, LV angiography  Indication: Severe aortic stenosis. This is a medically complicated 66 year old gentleman with CAD status post CABG. He presented with chest pain on this admission. His echocardiogram was suggestive of severe aortic stenosis. She underwent a transesophageal echo this morning as a confirmatory study and this was also suggestive of severe AS. He is now referred for right and left heart catheterization with coronary angiography. He also has a history of GI bleeding on Coumadin, permanent atrial fibrillation, and hepatic cirrhosis.  Procedural Details: The right groin was prepped, draped, and anesthetized with 1% lidocaine. Using the modified Seldinger technique a 5 French sheath was placed in the right femoral artery and a 7 French sheath was placed in the right femoral vein. A Swan-Ganz catheter was used for the right heart catheterization. Standard protocol was followed for recording of right heart pressures and sampling of oxygen saturations. Fick cardiac output was calculated. Standard Judkins catheters were used for selective coronary angiography and left ventriculography. The femoral sheath was upsized to a 6 French sheath so the Waverly catheter can be used to obtain simultaneous LV and AO pressures. I was unable to cross the aortic valve with a pigtail catheter and J-tip wire. An AL-1 catheter and straight-tip wire were used and this was successful. The AL-1 catheter was changed out for pigtail over the exchange length straight tip wire. Simultaneous pressures were recorded. There were no immediate procedural complications. The patient was transferred to the post catheterization recovery area for further monitoring.  Procedural Findings:   Hemodynamics  RA mean of 5  RV 31/2  PA 28/18 with a mean of 22  PCWP 7  LV 156/10  AO 124/73 with a mean of 93  Oxygen saturations:  PA 63  AO 97  Cardiac Output (Fick) 5.0  Cardiac Index (Fick) 2.2  Aortic valve hemodynamics: Mean gradient is 42, aortic valve area  is 0.76  Coronary angiography:  Coronary dominance: right  Left mainstem: Patent with no significant obstructive disease  Left anterior descending (LAD): Patent with 70% proximal stenosis. The mid LAD fills competitively from the native vessel, vein graft to diagonal, and LIMA to LAD.  Left circumflex (LCx): Widely patent through its stented segment in the mid circumflex. The first OM appears to be occluded. The second OM is patent. The AV groove circumflex courses down and supplies multiple small posterolateral branches with diffuse disease.  Right coronary artery (RCA): Dominant vessel. Diffuse plaquing but no high-grade obstructive disease is noted. The PDA is patent without significant stenosis. There are scattered 20-30% stenosis throughout the distribution of the RCA.  Free radial to obtuse marginal is widely patent throughout.  Saphenous vein graft to diagonal is widely patent. There is no obstructive disease noted. The diagonal fills antegrade in the LAD fills retrograde from graft flow.  LIMA to LAD is widely patent. There is brisk washout of the LAD from the multiple sources of blood flow.  Left ventriculography: LV function is vigorous. The estimated ejection fraction is 65-70%. The aortic valve shows moderate calcification.  Final Conclusions:  1. Moderate multivessel coronary artery disease as described  2. Status post multivessel coronary bypass surgery with continued patency of the LIMA to LAD, free radial to obtuse marginal, and saphenous vein graft to diagonal.  3. Vigorous LV systolic function  4. Severe aortic stenosis  Recommendations: Will request cardiac surgery consultation. I will carefully review all  of his echo data as well. He will obviously be high risk for consideration of conventional aortic valve replacement considering his previous cardiac surgery, history of GI bleeding, and hepatic cirrhosis.  Tonny Bollman  03/07/2012, 5:11 PM   Pulmonary function testing 03/09/2012 FEV1 2.68 (83% predicted) FVC 3.68 (85%)  FEV1 to FVC ratio 73% FEF 25-75 1.78 (68%) DLCO 111%  Impression: Mr. Granholm is a 66 year old gentleman with severe, symptomatic aortic stenosis along with a multitude of other medical problems. Aortic valve replacement is indicated for both survival benefit as well as relief of symptoms. He has had previous bypass surgery and, therefore, it would be a redo sternotomy. He is high risk given his multiple comorbidities. However, none of his comorbidities are severe enough be prohibitive. I think even with his higher risk he still is better off with conventional AVR versus a percutaneous approach, and he certainly has a lower stroke risk with a conventional approach.  I have discussed with the patient and his wife the general nature of the procedure, need for general anesthesia, and incisions to be used. I have discussed the expected hospital stay, overall recovery and short and long term outcomes. They understand the risks include, but are not limited to, death, stroke, MI, injury to bypass grafts, DVT/PE, bleeding, possible need for transfusion, infections, cardiac arrhythmias, complete heart block requiring permanent pacemaker placement, and other organ system dysfunction including respiratory, renal, or GI complications. He accepts these risks and wishes to proceed.  We also discussed the plan for placing a tissue valve, in this case bovine pericardial. He does understand the relative advantages and disadvantages of mechanical versus tissue valves. Although his age is borderline, given his history of frequent GI bleeds and early cirrhosis a tissue valve is indicated in his case. He  does understand that we may need to use Coumadin if he has arrhythmias, but otherwise we will plan to use aspirin alone.  Plan: Redo sternotomy for aortic valve replacement on Wednesday,  September 29, he will be admitted on the day of surgery.

## 2012-04-13 ENCOUNTER — Encounter (HOSPITAL_COMMUNITY): Payer: Self-pay | Admitting: Pharmacy Technician

## 2012-04-16 ENCOUNTER — Ambulatory Visit (HOSPITAL_COMMUNITY)
Admission: RE | Admit: 2012-04-16 | Discharge: 2012-04-16 | Disposition: A | Discharge status: Home / Self Care | Payer: Managed Care, Other (non HMO) | Source: Ambulatory Visit | Attending: Thoracic Surgery (Cardiothoracic Vascular Surgery) | Admitting: Thoracic Surgery (Cardiothoracic Vascular Surgery)

## 2012-04-16 ENCOUNTER — Encounter (HOSPITAL_COMMUNITY)
Admission: RE | Admit: 2012-04-16 | Discharge: 2012-04-16 | Disposition: A | Discharge status: Home / Self Care | Payer: Managed Care, Other (non HMO) | Source: Ambulatory Visit | Attending: Thoracic Surgery (Cardiothoracic Vascular Surgery) | Admitting: Thoracic Surgery (Cardiothoracic Vascular Surgery)

## 2012-04-16 ENCOUNTER — Encounter (HOSPITAL_COMMUNITY): Payer: Self-pay

## 2012-04-16 ENCOUNTER — Encounter (HOSPITAL_COMMUNITY): Payer: Managed Care, Other (non HMO)

## 2012-04-16 VITALS — BP 123/70 | HR 84 | Temp 98.0°F | Resp 18 | Ht 68.0 in | Wt 261.4 lb

## 2012-04-16 DIAGNOSIS — R0602 Shortness of breath: Secondary | ICD-10-CM | POA: Insufficient documentation

## 2012-04-16 DIAGNOSIS — Z01812 Encounter for preprocedural laboratory examination: Secondary | ICD-10-CM | POA: Insufficient documentation

## 2012-04-16 DIAGNOSIS — Z01818 Encounter for other preprocedural examination: Secondary | ICD-10-CM | POA: Insufficient documentation

## 2012-04-16 DIAGNOSIS — Z01810 Encounter for preprocedural cardiovascular examination: Secondary | ICD-10-CM | POA: Insufficient documentation

## 2012-04-16 DIAGNOSIS — I359 Nonrheumatic aortic valve disorder, unspecified: Secondary | ICD-10-CM

## 2012-04-16 DIAGNOSIS — D649 Anemia, unspecified: Secondary | ICD-10-CM | POA: Insufficient documentation

## 2012-04-16 DIAGNOSIS — I10 Essential (primary) hypertension: Secondary | ICD-10-CM | POA: Insufficient documentation

## 2012-04-16 DIAGNOSIS — E119 Type 2 diabetes mellitus without complications: Secondary | ICD-10-CM | POA: Insufficient documentation

## 2012-04-16 LAB — APTT: aPTT: 31 seconds (ref 24–37)

## 2012-04-16 LAB — CBC
MCH: 27.8 pg (ref 26.0–34.0)
MCV: 87.3 fL (ref 78.0–100.0)
Platelets: 140 10*3/uL — ABNORMAL LOW (ref 150–400)
RBC: 4.42 MIL/uL (ref 4.22–5.81)
RDW: 15.3 % (ref 11.5–15.5)
WBC: 5.8 10*3/uL (ref 4.0–10.5)

## 2012-04-16 LAB — URINALYSIS, ROUTINE W REFLEX MICROSCOPIC
Glucose, UA: >1000 mg/dL — AB
Ketones, ur: NEGATIVE mg/dL
Leukocytes, UA: NEGATIVE
Nitrite: NEGATIVE
Specific Gravity, Urine: 1.025 (ref 1.005–1.030)
pH: 5.5 (ref 5.0–8.0)

## 2012-04-16 LAB — BLOOD GAS, ARTERIAL
Drawn by: 344381
FIO2: 0.21 %
pCO2 arterial: 35.8 mmHg (ref 35.0–45.0)
pO2, Arterial: 56.9 mmHg — ABNORMAL LOW (ref 80.0–100.0)

## 2012-04-16 LAB — COMPREHENSIVE METABOLIC PANEL
ALT: 50 U/L (ref 0–53)
AST: 41 U/L — ABNORMAL HIGH (ref 0–37)
Albumin: 4.2 g/dL (ref 3.5–5.2)
CO2: 25 mEq/L (ref 19–32)
Calcium: 10.1 mg/dL (ref 8.4–10.5)
Chloride: 90 mEq/L — ABNORMAL LOW (ref 96–112)
Creatinine, Ser: 0.91 mg/dL (ref 0.50–1.35)
GFR calc non Af Amer: 88 mL/min — ABNORMAL LOW (ref >90)
Sodium: 132 mEq/L — ABNORMAL LOW (ref 135–145)

## 2012-04-16 LAB — URINE MICROSCOPIC-ADD ON

## 2012-04-16 LAB — SURGICAL PCR SCREEN: MRSA, PCR: NEGATIVE

## 2012-04-16 LAB — PROTIME-INR: Prothrombin Time: 13.4 seconds (ref 11.6–15.2)

## 2012-04-16 MED ORDER — CHLORHEXIDINE GLUCONATE 4 % EX LIQD: 30.0000 mL | CUTANEOUS | Status: DC

## 2012-04-16 NOTE — Progress Notes (Signed)
Pre-op Cardiac Surgery  Carotid Findings:  Bilaterally no significant ICA stenosis with antegrade vertebral flow.  Upper Extremity Right Left  Brachial Pressures 122 125  Radial Waveforms Tri  Previously harvested for CABG  Ulnar Waveforms Tri  Tri   Palmar Arch (Allen's Test) Normal with radial compression, obliterates with ulnar compression Obliterates with ulnar compression    Farrel Demark, RDMS, RVT

## 2012-04-16 NOTE — Progress Notes (Signed)
Patients Glucose was 536, EKG showed AFib with Rapid Ventricular Response- heart rate  of 122, Dr Jacklynn Bue notified and instructed me to have Edmonia Caprio see the chart 1st in am.

## 2012-04-16 NOTE — Pre-Procedure Instructions (Signed)
20 Connery Shiffler Dorminey  04/16/2012   Your procedure is scheduled on:  04/18/2012  Wednesday  Report to [facility] Short Stay Center at 0630 AM.  Call this number if you have problems the morning of surgery: [phone]   Remember:   Do not eat food do not drink :After Midnight.    Take these medicines the morning of surgery with A SIP OF WATER: lanoxin imdur  Metoprolol (Lopressor)   Nitroglycerin (if needed)  prilosec      Do not wear jewelry, make-up or nail polish.  Do not wear lotions, powders, or perfumes. You may wear deodorant.  Do not shave 48 hours prior to surgery. Men may shave face and neck.  Do not bring valuables to the hospital.  Contacts, dentures or bridgework may not be worn into surgery.  Leave suitcase in the car. After surgery it may be brought to your room.  For patients admitted to the hospital, checkout time is 11:00 AM the day of discharge.   Patients discharged the day of surgery will not be allowed to drive home.  Name and phone number of your driver:   Special Instructions: Shower using CHG 2 nights before surgery and the night before surgery.  If you shower the day of surgery use CHG.  Use special wash - you have one bottle of CHG for all showers.  You should use approximately 1/3 of the bottle for each shower.   Please read over the following fact sheets that you were given: Pain Booklet, Coughing and Deep Breathing, Blood Transfusion Information, Lab Information, Open Heart Packet, MRSA Information and Surgical Site Infection Prevention

## 2012-04-16 NOTE — Progress Notes (Signed)
CALLED  RYAN BROOKS ... [phone] // [phone] ... TO SEE PT PRIOR TO [facility] LEAVING HOSP .

## 2012-04-16 NOTE — Pre-Procedure Instructions (Signed)
20 [name]  04/16/2012   Your procedure is scheduled on:  04/18/2012  [facility]  Report to [facility] Short Stay Center at 0630 AM.  Call this number if you have problems the morning of surgery: [phone]   Remember:   Do not eat food NOR DRINK LIQUIDS:After Midnight.                 Take these medicines the morning of surgery with A SIP OF WATER: LANOXIN  IMDUR  METOPROLOL  NITROGLYCERIN IF NEEDED  PRILOSEC  VERAPIMIL   Do not wear jewelry, make-up or nail polish.  Do not wear lotions, powders, or perfumes. You may wear deodorant.  Do not shave 48 hours prior to surgery. Men may shave face and neck.  Do not bring valuables to the hospital.  Contacts, dentures or bridgework may not be worn into surgery.  Leave suitcase in the car. After surgery it may be brought to your room.  For patients admitted to the hospital, checkout time is 11:00 AM the day of discharge.   Patients discharged the day of surgery will not be allowed to drive home.  Name and phone number of your driver:   Special Instructions: Shower using CHG 2 nights before surgery and the night before surgery.  If you shower the day of surgery use CHG.  Use special wash - you have one bottle of CHG for all showers.  You should use approximately 1/3 of the bottle for each shower.   Please read over the following fact sheets that you were given: Pain Booklet, Coughing and Deep Breathing, Blood Transfusion Information, Lab Information, Open Heart Packet, MRSA Information and Surgical Site Infection Prevention

## 2012-04-17 ENCOUNTER — Ambulatory Visit (INDEPENDENT_AMBULATORY_CARE_PROVIDER_SITE_OTHER): Payer: Managed Care, Other (non HMO) | Admitting: Internal Medicine

## 2012-04-17 ENCOUNTER — Encounter (HOSPITAL_COMMUNITY): Payer: Self-pay | Admitting: Emergency Medicine

## 2012-04-17 ENCOUNTER — Encounter (HOSPITAL_COMMUNITY): Payer: Self-pay | Admitting: Vascular Surgery

## 2012-04-17 ENCOUNTER — Emergency Department (HOSPITAL_COMMUNITY): Payer: Managed Care, Other (non HMO)

## 2012-04-17 ENCOUNTER — Telehealth: Payer: Self-pay | Admitting: *Deleted

## 2012-04-17 ENCOUNTER — Inpatient Hospital Stay (HOSPITAL_COMMUNITY)
Admission: EM | Admit: 2012-04-17 | Discharge: 2012-04-20 | DRG: 074 | Disposition: A | Discharge status: Home / Self Care | Payer: Managed Care, Other (non HMO) | Attending: Internal Medicine | Admitting: Internal Medicine

## 2012-04-17 ENCOUNTER — Other Ambulatory Visit: Payer: Self-pay

## 2012-04-17 DIAGNOSIS — I10 Essential (primary) hypertension: Secondary | ICD-10-CM

## 2012-04-17 DIAGNOSIS — E663 Overweight: Secondary | ICD-10-CM

## 2012-04-17 DIAGNOSIS — I509 Heart failure, unspecified: Secondary | ICD-10-CM | POA: Diagnosis present

## 2012-04-17 DIAGNOSIS — J449 Chronic obstructive pulmonary disease, unspecified: Secondary | ICD-10-CM | POA: Diagnosis present

## 2012-04-17 DIAGNOSIS — Z794 Long term (current) use of insulin: Secondary | ICD-10-CM

## 2012-04-17 DIAGNOSIS — K219 Gastro-esophageal reflux disease without esophagitis: Secondary | ICD-10-CM

## 2012-04-17 DIAGNOSIS — Z91199 Patient's noncompliance with other medical treatment and regimen due to unspecified reason: Secondary | ICD-10-CM

## 2012-04-17 DIAGNOSIS — R079 Chest pain, unspecified: Secondary | ICD-10-CM

## 2012-04-17 DIAGNOSIS — Z7982 Long term (current) use of aspirin: Secondary | ICD-10-CM

## 2012-04-17 DIAGNOSIS — Q2730 Arteriovenous malformation, site unspecified: Secondary | ICD-10-CM

## 2012-04-17 DIAGNOSIS — Z79899 Other long term (current) drug therapy: Secondary | ICD-10-CM

## 2012-04-17 DIAGNOSIS — R188 Other ascites: Secondary | ICD-10-CM

## 2012-04-17 DIAGNOSIS — Z9119 Patient's noncompliance with other medical treatment and regimen: Secondary | ICD-10-CM

## 2012-04-17 DIAGNOSIS — Z9861 Coronary angioplasty status: Secondary | ICD-10-CM

## 2012-04-17 DIAGNOSIS — I4821 Permanent atrial fibrillation: Secondary | ICD-10-CM

## 2012-04-17 DIAGNOSIS — Z888 Allergy status to other drugs, medicaments and biological substances status: Secondary | ICD-10-CM

## 2012-04-17 DIAGNOSIS — E119 Type 2 diabetes mellitus without complications: Secondary | ICD-10-CM

## 2012-04-17 DIAGNOSIS — R0602 Shortness of breath: Secondary | ICD-10-CM

## 2012-04-17 DIAGNOSIS — K7460 Unspecified cirrhosis of liver: Secondary | ICD-10-CM

## 2012-04-17 DIAGNOSIS — R0989 Other specified symptoms and signs involving the circulatory and respiratory systems: Secondary | ICD-10-CM

## 2012-04-17 DIAGNOSIS — D731 Hypersplenism: Secondary | ICD-10-CM

## 2012-04-17 DIAGNOSIS — Z8249 Family history of ischemic heart disease and other diseases of the circulatory system: Secondary | ICD-10-CM

## 2012-04-17 DIAGNOSIS — I359 Nonrheumatic aortic valve disorder, unspecified: Secondary | ICD-10-CM

## 2012-04-17 DIAGNOSIS — I25810 Atherosclerosis of coronary artery bypass graft(s) without angina pectoris: Secondary | ICD-10-CM

## 2012-04-17 DIAGNOSIS — E785 Hyperlipidemia, unspecified: Secondary | ICD-10-CM | POA: Diagnosis present

## 2012-04-17 DIAGNOSIS — I350 Nonrheumatic aortic (valve) stenosis: Secondary | ICD-10-CM

## 2012-04-17 DIAGNOSIS — R7309 Other abnormal glucose: Secondary | ICD-10-CM

## 2012-04-17 DIAGNOSIS — D509 Iron deficiency anemia, unspecified: Secondary | ICD-10-CM

## 2012-04-17 DIAGNOSIS — M1990 Unspecified osteoarthritis, unspecified site: Secondary | ICD-10-CM

## 2012-04-17 DIAGNOSIS — Z87891 Personal history of nicotine dependence: Secondary | ICD-10-CM

## 2012-04-17 DIAGNOSIS — N62 Hypertrophy of breast: Secondary | ICD-10-CM

## 2012-04-17 DIAGNOSIS — E1149 Type 2 diabetes mellitus with other diabetic neurological complication: Principal | ICD-10-CM | POA: Diagnosis present

## 2012-04-17 DIAGNOSIS — J4489 Other specified chronic obstructive pulmonary disease: Secondary | ICD-10-CM | POA: Diagnosis present

## 2012-04-17 DIAGNOSIS — I2510 Atherosclerotic heart disease of native coronary artery without angina pectoris: Secondary | ICD-10-CM | POA: Diagnosis present

## 2012-04-17 DIAGNOSIS — R590 Localized enlarged lymph nodes: Secondary | ICD-10-CM

## 2012-04-17 DIAGNOSIS — E669 Obesity, unspecified: Secondary | ICD-10-CM | POA: Diagnosis present

## 2012-04-17 DIAGNOSIS — I4891 Unspecified atrial fibrillation: Secondary | ICD-10-CM

## 2012-04-17 DIAGNOSIS — I5032 Chronic diastolic (congestive) heart failure: Secondary | ICD-10-CM

## 2012-04-17 DIAGNOSIS — Z23 Encounter for immunization: Secondary | ICD-10-CM

## 2012-04-17 DIAGNOSIS — R739 Hyperglycemia, unspecified: Secondary | ICD-10-CM | POA: Diagnosis present

## 2012-04-17 DIAGNOSIS — G4733 Obstructive sleep apnea (adult) (pediatric): Secondary | ICD-10-CM | POA: Diagnosis present

## 2012-04-17 DIAGNOSIS — I4510 Unspecified right bundle-branch block: Secondary | ICD-10-CM | POA: Diagnosis present

## 2012-04-17 DIAGNOSIS — D696 Thrombocytopenia, unspecified: Secondary | ICD-10-CM

## 2012-04-17 DIAGNOSIS — D6959 Other secondary thrombocytopenia: Secondary | ICD-10-CM

## 2012-04-17 DIAGNOSIS — Z951 Presence of aortocoronary bypass graft: Secondary | ICD-10-CM

## 2012-04-17 DIAGNOSIS — D649 Anemia, unspecified: Secondary | ICD-10-CM

## 2012-04-17 DIAGNOSIS — Z7902 Long term (current) use of antithrombotics/antiplatelets: Secondary | ICD-10-CM

## 2012-04-17 DIAGNOSIS — E1142 Type 2 diabetes mellitus with diabetic polyneuropathy: Secondary | ICD-10-CM | POA: Diagnosis present

## 2012-04-17 DIAGNOSIS — K5520 Angiodysplasia of colon without hemorrhage: Secondary | ICD-10-CM

## 2012-04-17 DIAGNOSIS — Z8719 Personal history of other diseases of the digestive system: Secondary | ICD-10-CM

## 2012-04-17 DIAGNOSIS — Z885 Allergy status to narcotic agent status: Secondary | ICD-10-CM

## 2012-04-17 LAB — CBC WITH DIFFERENTIAL/PLATELET
Basophils Absolute: 0 10*3/uL (ref 0.0–0.1)
Basophils Relative: 1 % (ref 0–1)
Eosinophils Absolute: 0.2 10*3/uL (ref 0.0–0.7)
Eosinophils Relative: 3 % (ref 0–5)
HCT: 41.2 % (ref 39.0–52.0)
Hemoglobin: 13.5 g/dL (ref 13.0–17.0)
Lymphocytes Relative: 20 % (ref 12–46)
Lymphs Abs: 1.1 10*3/uL (ref 0.7–4.0)
MCH: 28.4 pg (ref 26.0–34.0)
MCHC: 32.8 g/dL (ref 30.0–36.0)
MCV: 86.7 fL (ref 78.0–100.0)
Monocytes Absolute: 0.5 10*3/uL (ref 0.1–1.0)
Monocytes Relative: 9 % (ref 3–12)
Neutro Abs: 3.9 10*3/uL (ref 1.7–7.7)
Neutrophils Relative %: 68 % (ref 43–77)
Platelets: 129 10*3/uL — ABNORMAL LOW (ref 150–400)
RBC: 4.75 MIL/uL (ref 4.22–5.81)
RDW: 15.2 % (ref 11.5–15.5)
WBC: 5.7 10*3/uL (ref 4.0–10.5)

## 2012-04-17 LAB — BASIC METABOLIC PANEL
BUN: 20 mg/dL (ref 6–23)
CO2: 29 mEq/L (ref 19–32)
Calcium: 10.5 mg/dL (ref 8.4–10.5)
Chloride: 89 mEq/L — ABNORMAL LOW (ref 96–112)
Creatinine, Ser: 0.96 mg/dL (ref 0.50–1.35)
GFR calc Af Amer: >90 mL/min (ref >90)
GFR calc non Af Amer: 86 mL/min — ABNORMAL LOW (ref >90)
Glucose, Bld: 429 mg/dL — ABNORMAL HIGH (ref 70–99)
Potassium: 4.7 mEq/L (ref 3.5–5.1)
Sodium: 132 mEq/L — ABNORMAL LOW (ref 135–145)

## 2012-04-17 LAB — URINALYSIS, ROUTINE W REFLEX MICROSCOPIC
Bilirubin Urine: NEGATIVE
Glucose, UA: >1000 mg/dL — AB
Hgb urine dipstick: NEGATIVE
Ketones, ur: NEGATIVE mg/dL
Leukocytes, UA: NEGATIVE
Nitrite: NEGATIVE
Protein, ur: NEGATIVE mg/dL
Specific Gravity, Urine: 1.016 (ref 1.005–1.030)
Urobilinogen, UA: 0.2 mg/dL (ref 0.0–1.0)
pH: 6 (ref 5.0–8.0)

## 2012-04-17 LAB — URINE MICROSCOPIC-ADD ON

## 2012-04-17 LAB — GLUCOSE, CAPILLARY
Glucose-Capillary: 299 mg/dL — ABNORMAL HIGH (ref 70–99)
Glucose-Capillary: 302 mg/dL — ABNORMAL HIGH (ref 70–99)

## 2012-04-17 LAB — PROTIME-INR
INR: 0.94 (ref 0.00–1.49)
Prothrombin Time: 12.5 seconds (ref 11.6–15.2)

## 2012-04-17 LAB — DIGOXIN LEVEL: Digoxin Level: 0.9 ng/mL (ref 0.8–2.0)

## 2012-04-17 LAB — HEMOGLOBIN A1C: Mean Plasma Glucose: 217 mg/dL — ABNORMAL HIGH (ref <117)

## 2012-04-17 MED ORDER — INSULIN ASPART 100 UNIT/ML ~~LOC~~ SOLN
10.0000 [IU] | Freq: Once | SUBCUTANEOUS | Status: AC
  Administered 2012-04-17: 10 [IU] via INTRAVENOUS
  Filled 2012-04-17: qty 1

## 2012-04-17 MED ORDER — VERAPAMIL HCL 2.5 MG/ML IV SOLN
5.0000 mg | Freq: Once | INTRAVENOUS | Status: AC
  Administered 2012-04-17: 5 mg via INTRAVENOUS
  Filled 2012-04-17: qty 2

## 2012-04-17 NOTE — Telephone Encounter (Signed)
Called patient to let him know his blood work results from yesterday's short stay appt.  Patient told to go to [facility] ER and that he would be getting admitted to help control heart rate and lower blood sugar.  Surgery for tomorrow has been cancelled per Dr. Dorris Fetch.  Answered all of patients questions.  Dr. Excell Seltzer aware of patient coming.

## 2012-04-17 NOTE — ED Notes (Signed)
Troponin results per POCT standards:    0.02

## 2012-04-17 NOTE — Consult Note (Signed)
Consult Note  Patient ID: [name] MRN: [id number], SOB: Apr 26, 1946 66 y.o. Date of Encounter: 04/17/2012, 4:39 PM  Primary Physician: [name], MD Primary Cardiologist: Corinda Gubler in [place]  Chief Complaint: Hyperglycemia and a.fib w/ rvr on preop workup  HPI: 66 y.o. 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  Patient was recently hospitalized from 8/9-8/17/2013 with chest pain and found to have severe AS with mild AI on TEE and cath. TCTS consulted and felt he would benefit from AV replacement which was scheduled for tomorrow (9/25). Preop labs showed glucose >500 and EKGs showed afib w/ rates110-120s for which he was told to present to the ED. He reports feeling well at home without any new complaints. Has chronic sob that has not worsened. No chest pain, palpitations, or syncope. Has been eating cookies and had elevated blood sugars at home >300.  Not takingTradjenta due to cost.  In the ED EKG shows a.fib 91bpm, incomplete RBBB, unchanged from prior EKG. CXR is without acute cardiopulmonary abnormalities. Labs are significant for BUN/Crt 20/0.96, Plts 129, glucose 429, Na 132, otherwise unremarkable CBC/BMET, UA w/ >1000 glucose. Dr. Dorris Fetch evaluated the pt in the ED and is going to postpone the surgery for at least one week while blood sugars are managed.    Past Medical History  Diagnosis Date  . Overweight   . Coronary atherosclerosis of artery bypass graft   . Atrial fibrillation     Permanent; off of Coumadin for now due to GI  bleed  . Type II or unspecified type diabetes mellitus without mention of complication, not stated as uncontrolled   . Insomnia   . Carotid bruit 06/14/2011  . Angina   . Hypertension   . CHF (congestive heart failure)   . GERD (gastroesophageal reflux disease)   . COPD (chronic obstructive pulmonary disease)   . Iron deficiency anemia     Requiring intravenous iron  . H/O hiatal hernia   . AVM (arteriovenous malformation)     Recurrent GI bleeding requiring multiple transfusions  . Hyperlipidemia   . Thrombocytopenia   . Ascites     status post paracentesis with removal of 3.4 L of ascitic fluid  . Osteoarthritis   . Mediastinal adenopathy 09/22/2011  . Cirrhosis   . Iron deficiency anemia   . Mediastinal adenopathy 09/22/2011  . Chronic diastolic CHF (congestive heart failure) 03/02/2012  . Ascites 09/20/2011  . Permanent atrial fibrillation 03/02/2012  . Aortic stenosis 03/08/2012  . 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    LIMA to LAD, LRA to OM Branch, SVG to Diagonal Branch - Dr Dorris Fetch  . Shortness of breath     WITH EXERTION (WALKING)  . OSA (obstructive sleep apnea) 1999    DOES NOT USE CPAP  . Heart murmur     TEE 03/07/2012  Left Ventrical: Normal LV function. EF 65%  Mitral Valve: mild MR  Aortic Valve: Mild AI, severe AS  Tricuspid Valve: normal. Trivial TR  Pulmonic Valve: normal  Left Atrium/ Left atrial appendage: no thrombi, seen  moderately well  Atrial septum: no ASD by color flow  Aorta: normal   Cardiac Cath 03/07/2012  1. Moderate multivessel coronary artery disease as described  2. Status post multivessel coronary bypass surgery with continued patency of the LIMA to LAD, free radial to obtuse marginal, and saphenous vein graft to diagonal.  3. Vigorous LV systolic function  4. Severe aortic stenosis  Recommendations: Will request cardiac surgery consultation. I will carefully review all of his echo data as well. He  will obviously be high risk for consideration of conventional aortic valve replacement considering his previous cardiac surgery, history of GI bleeding, and hepatic cirrhosis. Assess history of GI bleeding and hepatic cirrhosis. and is followed by Dr. Karilyn Cota in [place]. and is followed by Dr. Karilyn Cota in [place]. He was found to have some mild CHF and abdominal distention on admission. He had evidence of diastolic CHF. He has a history of severe aortic valve stenosis he was diuresed with IV Lasix. With a total weight loss of 19 pounds since 03/03/2012.  2D echo 03/05/12 - Left ventricle: The cavity size was normal. Wall thickness was normal. Systolic function was vigorous. The estimated ejection fraction was in the range of 65% to 70%. - Aortic valve: AV is thickened, calcified with restricted motion. Peak and mean gradients through the valve are 76 and 45 mm Hg respectively consistent with severe AS. consider TEE to define further as difficult to see well. Trivial regurgitation. - Mitral valve: Mild regurgitation.   Surgical History:  Past Surgical History  Procedure Date  . Coronary artery bypass graft  10/15/2002     Salvatore Decent. Dorris Fetch, M.D.     . Carpal tunnel release   10/08/2003  . Lipoma surgery   . Hernia repair   . Other surgical history 08/26/2011    [facility],  enteroscopy , revealing "three-four AVMs."   . Tee without cardioversion 03/07/2012    Procedure: TRANSESOPHAGEAL ECHOCARDIOGRAM (TEE);  Surgeon: Vesta Mixer, MD;  Location: [facility] ENDOSCOPY;  Service: Cardiovascular;  Laterality: N/A;  . Coronary angioplasty with stent placement 01/19/2005    drug eluting stent to high grade ostial stenosis of radial artery graft to OM     Home Meds: Medication Sig  Ascorbic Acid (VITAMIN C) 1000 MG tablet Take 1,000 mg by mouth 2 (two) times daily.  aspirin EC 81 MG tablet Take 1 tablet (81 mg total) by mouth daily.  atorvastatin (LIPITOR) 20 MG tablet Take 20 mg by mouth at bedtime.   digoxin  (LANOXIN) 0.25 MG tablet Take 1 tablet (250 mcg total) by mouth daily.  ferrous gluconate (FERGON) 325 MG tablet Take 325 mg by mouth 3 (three) times daily with meals.  furosemide (LASIX) 80 MG tablet Take 80 mg by mouth 2 (two) times daily.  glimepiride (AMARYL) 4 MG tablet Take 4 mg by mouth daily with supper.   isosorbide mononitrate (IMDUR) 30 MG 24 hr tablet Take 1 tablet (30 mg total) by mouth daily.  linagliptin (TRADJENTA) 5 MG TABS tablet Take 1 tablet (5 mg total) by mouth daily.  lisinopril (PRINIVIL,ZESTRIL) 10 MG tablet Take 1 tablet (10 mg total) by mouth daily.  metFORMIN (GLUCOPHAGE) 1000 MG tablet Take 1,000 mg by mouth 2 (two) times daily with a meal.   metoprolol (LOPRESSOR) 50 MG tablet Take 75 mg by mouth 2 (two) times daily.  Multiple Vitamin (MULTIVITAMIN) tablet Take 1 tablet by mouth daily.   nitroGLYCERIN (NITROSTAT) 0.4 MG SL tablet Place 0.4 mg under the tongue every  5 (five) minutes x 3 doses as needed. For chest pain  Omega-3 Fatty Acids (FISH OIL) 1200 MG CAPS Take 1,200 mg by mouth 2 (two) times daily.   omeprazole (PRILOSEC) 20 MG capsule Take 40 mg by mouth daily.   potassium chloride (K-DUR) 10 MEQ tablet Take 30 mEq by mouth 2 (two) times daily.  sitaGLIPtan (JANUVIA) 100 MG tablet Take 100 mg by mouth daily.   verapamil (CALAN) 80 MG tablet Take 80 mg by mouth 3 (three) times daily.    Allergies:  Allergies  Allergen Reactions  . Codeine Other (See Comments)    Hurting in chest  . Diltiazem Hcl Itching  . Niacin Other (See Comments)    Hot flashes    History   Social History  . Marital Status: Married    Spouse Name: N/A    Number of Children: 3  . Years of Education: N/A   Occupational History  .      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 Smoking status: Former Smoker -- 1.0 packs/day for 30 years    Types: Cigarettes    Quit date: 07/25/2000  . Smokeless tobacco: Never Used  . Alcohol Use: 0.6 oz/week    1 Shots  of liquor per week     Occasionally  . Drug Use: No  . Sexually Active: Yes   Other Topics Concern  . Not on file   Social History Narrative   Lives in [place], [place] with wife.      Family History  Problem Relation Age of Onset  . Coronary artery disease      FAMILY HISTORY  . Hypertension Father   . Diabetes Father   . Heart disease Father   . COPD Sister   . Cancer Maternal Aunt     Breast cancer   . Cancer Maternal Aunt     Breast cancer   . Diabetes Son   . Cancer Daughter     Cervical cancer    Review of Systems: General: negative for chills, fever, night sweats or weight changes.  Cardiovascular: (+) chronic shortness of breath; negative for chest pain, dyspnea on exertion, edema, orthopnea, palpitations, paroxysmal nocturnal dyspnea  Dermatological: negative for rash Respiratory: negative for cough or wheezing Urologic: negative for hematuria Abdominal: negative for nausea, vomiting, diarrhea, bright red blood per rectum, melena, or hematemesis Neurologic: negative for visual changes, syncope, or dizziness All other systems reviewed and are otherwise negative except as noted above.  Labs:   Component Value Date   WBC 5.7 04/17/2012   HGB 13.5 04/17/2012   HCT 41.2 04/17/2012   MCV 86.7 04/17/2012   PLT 129* 04/17/2012    Lab 04/17/12 1413 04/16/12 1434  NA 132* --  K 4.7 --  CL 89* --  CO2 29 --  BUN 20 --  CREATININE 0.96 --  CALCIUM 10.5 --  PROT -- 7.9  BILITOT -- 0.4  ALKPHOS -- 90  ALT -- 50  AST -- 41*  GLUCOSE 429* --   Radiology/Studies:   04/17/2012 - Chest 2 View Findings: Status post median sternotomy and CABG.  Heart size upper normal.  No confluent airspace opacity.  No pleural effusion or pneumothorax.  No acute osseous finding.  Multilevel degenerative change.  IMPRESSION: No radiographic evidence of acute cardiopulmonary process.       EKG: 04/17/12 @ 1403 - Atrial Fibrillation 91bpm incomplete RBBB, unchanged from prior  EKG  Physical Exam: Blood pressure 117/56,  pulse 75, resp. rate 17, height 5\' 8"  (1.727 m), weight 261 lb (118.389 kg), SpO2 99.00%. General: Obese white male in no acute distress. Head: Normocephalic, atraumatic, sclera non-icteric, nares are without discharge Neck: Supple. Negative for carotid bruits. JVD not elevated. Lungs: Clear bilaterally to auscultation without wheezes, rales, or rhonchi. Breathing is unlabored. Heart: Irregularly irregular with S1 S2. 3/6 systolic crescendo decrescendo murmur. No rubs or gallops appreciated. Abdomen: Obese, Soft, non-tender, non-distended with normoactive bowel sounds. No rebound/guarding. No obvious abdominal masses. Msk:  Strength and tone appear normal for age. Extremities: No edema. No clubbing or cyanosis. Chronic skin discoloration to bilat shins. Distal pedal pulses are intact and equal bilaterally. Neuro: Alert and oriented X 3. Moves all extremities spontaneously. Psych:  Responds to questions appropriately with a normal affect.    ASSESSMENT AND PLAN:   66 y.o. 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  See MD note below  Signed, Jaz Laningham PA-C 04/17/2012, 4:39 PM  Patient seen with PA, agree with the above note.  Patient's HR is in the 80s at rest, up to 120s with activity.  His blood glucose has been out of control.  He reports polyuria, polydipsia.  Otherwise, dyspnea is at baseline and he has had no chest pain.  1. Diabetes: Needs better control.  His AVR will be postponed until next week per Dr.  Dorris Fetch.  Plan to admit him to hospitalist service to get him on a stable diabetes regimen pre-op.  2. Atrial fibrillation: Mildly elevated rates.  Will change metoprolol to Toprol XL 75 mg bid, continue other meds.  No warfarin due to GI bleeding.  3. AS: Severe.  Surgery will be postponed until next week.  4. Diastolic CHF: He does not appear significantly volume overloaded.  5. CAD: Stable, continue meds.    Marca Ancona 04/17/2012 5:37 PM

## 2012-04-17 NOTE — H&P (Signed)
[name] is an 66 y.o. male.   Patient was seen and examined on April 17, 2012. PCP - Dr. [name]. Cardiologist - [place] cardiologist. Chief Complaint: Increased blood sugar and heart rate. HPI: 66 year-old male with history of severe aortic stenosis who was scheduled for surgery tomorrow had preop labs done which showed elevated blood sugar was referred to the ER for further management. In the ER patient also was found to be in A. fib with RVR. Patient has a known history of atrial fibrillation not on Coumadin secondary to GI bleed and cirrhosis of liver. Cardiology has already seen the patient and at this time has recommended to increase Toprol dose to 75 mg q. twice a day. Cardiothoracic surgery at this time is postponing his surgery due to his uncontrolled diabetes. Patient but she was found to be high but not in DKA. Patient states over the last couple of days he had run out of his Jearld Lesch but has been taking his other medications regularly. Over the last one month his blood sugar has been running high. Patient was given NovoLog insulin 10 units subcutaneous and at this time his blood sugars come down to 300. Patient will be admitted for further management. Patient otherwise denies any chest pain shortness of breath nausea vomiting abdominal pain fever chills.  Past Medical History  Diagnosis Date  . Overweight   . Coronary atherosclerosis of artery bypass graft   . Atrial fibrillation     Permanent; off of Coumadin for now due to GI bleed  . Type II or unspecified type diabetes mellitus without mention of complication, not stated as uncontrolled   . Insomnia   . Carotid bruit 06/14/2011  . Angina   . Hypertension   . CHF (congestive heart failure)   . GERD (gastroesophageal reflux disease)   . COPD (chronic obstructive pulmonary disease)   . Iron deficiency anemia     Requiring intravenous iron  . H/O hiatal hernia   . AVM (arteriovenous malformation)     Recurrent  GI bleeding requiring multiple transfusions  . Hyperlipidemia   . Thrombocytopenia   . Ascites     status post paracentesis with removal of 3.4 L of ascitic fluid  . Osteoarthritis   . Mediastinal adenopathy 09/22/2011  . Cirrhosis   . Iron deficiency anemia   . Mediastinal adenopathy 09/22/2011  . Chronic diastolic CHF (congestive heart failure) 03/02/2012  . Ascites 09/20/2011  . Permanent atrial fibrillation 03/02/2012  . Aortic stenosis 03/08/2012  . 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    LIMA to LAD, LRA to OM Branch, SVG to Diagonal Branch - Dr Dorris Fetch  . Shortness of breath     WITH EXERTION (WALKING)  . OSA (obstructive sleep apnea) 1999    DOES NOT USE CPAP  . Heart murmur     Past Surgical History  Procedure Date  . Coronary artery bypass graft  10/15/2002     Salvatore Decent. Dorris Fetch, M.D.     . Carpal tunnel release   10/08/2003  . Lipoma surgery   . Hernia repair   . Other surgical history 08/26/2011    [facility],  enteroscopy , revealing "three-four AVMs."   . Tee without cardioversion 03/07/2012    Procedure: TRANSESOPHAGEAL ECHOCARDIOGRAM (TEE);  Surgeon: Vesta Mixer, MD;  Location: [facility] ENDOSCOPY;  Service: Cardiovascular;  Laterality: N/A;  . Coronary angioplasty with stent placement 01/19/2005    drug eluting stent to high  grade ostial stenosis of radial artery graft to OM    Family History  Problem Relation Age of Onset  . Coronary artery disease      FAMILY HISTORY  . Hypertension Father   . Diabetes Father   . Heart disease Father   . COPD Sister   . Cancer Maternal Aunt     Breast cancer   . Cancer Maternal Aunt     Breast cancer   . Diabetes Son   . Cancer Daughter     Cervical cancer   Social History:  reports that he quit smoking about 11 years ago. His smoking use included Cigarettes. He has a 30 pack-year smoking history. He has never used smokeless tobacco. He reports that he drinks about .6 ounces of  alcohol per week. He reports that he does not use illicit drugs.  Allergies:  Allergies  Allergen Reactions  . Codeine Other (See Comments)    Hurting in chest  . Diltiazem Hcl Itching  . Niacin Other (See Comments)    Hot flashes     (Not in a hospital admission)  Results for orders placed during the hospital encounter of 04/17/12 (from the past 48 hour(s))  CBC WITH DIFFERENTIAL     Status: Abnormal   Collection Time   04/17/12  2:13 PM      Component Value Range Comment   WBC 5.7  4.0 - 10.5 K/uL    RBC 4.75  4.22 - 5.81 MIL/uL    Hemoglobin 13.5  13.0 - 17.0 g/dL    HCT 45.4  09.8 - 11.9 %    MCV 86.7  78.0 - 100.0 fL    MCH 28.4  26.0 - 34.0 pg    MCHC 32.8  30.0 - 36.0 g/dL    RDW 14.7  82.9 - 56.2 %    Platelets 129 (*) 150 - 400 K/uL    Neutrophils Relative 68  43 - 77 %    Neutro Abs 3.9  1.7 - 7.7 K/uL    Lymphocytes Relative 20  12 - 46 %    Lymphs Abs 1.1  0.7 - 4.0 K/uL    Monocytes Relative 9  3 - 12 %    Monocytes Absolute 0.5  0.1 - 1.0 K/uL    Eosinophils Relative 3  0 - 5 %    Eosinophils Absolute 0.2  0.0 - 0.7 K/uL    Basophils Relative 1  0 - 1 %    Basophils Absolute 0.0  0.0 - 0.1 K/uL   BASIC METABOLIC PANEL     Status: Abnormal   Collection Time   04/17/12  2:13 PM      Component Value Range Comment   Sodium 132 (*) 135 - 145 mEq/L    Potassium 4.7  3.5 - 5.1 mEq/L    Chloride 89 (*) 96 - 112 mEq/L    CO2 29  19 - 32 mEq/L    Glucose, Bld 429 (*) 70 - 99 mg/dL    BUN 20  6 - 23 mg/dL    Creatinine, Ser 1.30  0.50 - 1.35 mg/dL    Calcium 86.5  8.4 - 10.5 mg/dL    GFR calc non Af Amer 86 (*) >90 mL/min    GFR calc Af Amer >90  >90 mL/min   PROTIME-INR     Status: Normal   Collection Time   04/17/12  2:13 PM      Component Value Range Comment   Prothrombin Time  12.5  11.6 - 15.2 seconds    INR 0.94  0.00 - 1.49   GLUCOSE, CAPILLARY     Status: Abnormal   Collection Time   04/17/12  2:25 PM      Component Value Range Comment    Glucose-Capillary 407 (*) 70 - 99 mg/dL    Comment 1 Notify RN      Comment 2 Documented in Chart     URINALYSIS, ROUTINE W REFLEX MICROSCOPIC     Status: Abnormal   Collection Time   04/17/12  2:30 PM      Component Value Range Comment   Color, Urine YELLOW  YELLOW    APPearance CLOUDY (*) CLEAR    Specific Gravity, Urine 1.016  1.005 - 1.030    pH 6.0  5.0 - 8.0    Glucose, UA >1000 (*) NEGATIVE mg/dL    Hgb urine dipstick NEGATIVE  NEGATIVE    Bilirubin Urine NEGATIVE  NEGATIVE    Ketones, ur NEGATIVE  NEGATIVE mg/dL    Protein, ur NEGATIVE  NEGATIVE mg/dL    Urobilinogen, UA 0.2  0.0 - 1.0 mg/dL    Nitrite NEGATIVE  NEGATIVE    Leukocytes, UA NEGATIVE  NEGATIVE   URINE MICROSCOPIC-ADD ON     Status: Normal   Collection Time   04/17/12  2:30 PM      Component Value Range Comment   Squamous Epithelial / LPF RARE  RARE    WBC, UA 0-2  <3 WBC/hpf   DIGOXIN LEVEL     Status: Normal   Collection Time   04/17/12  3:59 PM      Component Value Range Comment   Digoxin Level 0.9  0.8 - 2.0 ng/mL   GLUCOSE, CAPILLARY     Status: Abnormal   Collection Time   04/17/12  5:19 PM      Component Value Range Comment   Glucose-Capillary 299 (*) 70 - 99 mg/dL   GLUCOSE, CAPILLARY     Status: Abnormal   Collection Time   04/17/12  9:34 PM      Component Value Range Comment   Glucose-Capillary 302 (*) 70 - 99 mg/dL    Dg Chest 2 View  10/15/4008  *RADIOLOGY REPORT*  Clinical Data: Hyperglycemia  CHEST - 2 VIEW  Comparison: 04/16/2012  Findings: Status post median sternotomy and CABG.  Heart size upper normal.  No confluent airspace opacity.  No pleural effusion or pneumothorax.  No acute osseous finding.  Multilevel degenerative change.  IMPRESSION: No radiographic evidence of acute cardiopulmonary process.   Original Report Authenticated By: Waneta Martins, M.D.    Dg Chest 2 View  04/16/2012  *RADIOLOGY REPORT*  Clinical Data: Anemia, hypertension and diabetes, preoperative evaluation for  aortic valve replacement.  Shortness of breath on exertion.  CHEST - 2 VIEW  Comparison: 03/09/2012  Findings: The cardiac shadow is stable.  Postoperative changes are seen.   The lungs are well aerated without focal infiltrate or sizable effusion. Calcified granulomas are again noted in the bases bilaterally.  IMPRESSION: No acute abnormality is seen.   Original Report Authenticated By: Phillips Odor, M.D.     Review of Systems  Constitutional: Negative.   HENT: Negative.   Eyes: Negative.   Respiratory: Negative.   Cardiovascular: Negative.   Gastrointestinal: Negative.   Genitourinary: Negative.   Musculoskeletal: Negative.   Skin: Negative.   Neurological: Negative.   Endo/Heme/Allergies: Negative.   Psychiatric/Behavioral: Negative.     Blood pressure 124/64,  pulse 66, temperature 97.8 F (36.6 C), temperature source Oral, resp. rate 18, height 5\' 8"  (1.727 m), weight 118.389 kg (261 lb), SpO2 97.00%. Physical Exam  Constitutional: He is oriented to person, place, and time. He appears well-developed and well-nourished. No distress.  HENT:  Head: Normocephalic and atraumatic.  Eyes: Conjunctivae normal are normal. Pupils are equal, round, and reactive to light. Right eye exhibits no discharge. Left eye exhibits no discharge. No scleral icterus.  Neck: Normal range of motion. Neck supple.  Cardiovascular: Normal rate.   Murmur heard.      Irregular rhythm  Respiratory: Effort normal and breath sounds normal. No respiratory distress. He has no wheezes. He has no rales.  GI: Soft. Bowel sounds are normal. He exhibits no distension. There is no tenderness. There is no rebound.  Musculoskeletal: Normal range of motion. He exhibits no edema and no tenderness.  Neurological: He is alert and oriented to person, place, and time.       Moves all extremities.  Skin: Skin is warm and dry. He is not diaphoretic.  Psychiatric: His behavior is normal.     Assessment/Plan #1. Uncontrolled  diabetes mellitus2 - at this time we will hold off patient's metformin and Tradjenta. I have placed patient on small dose of Lantus 5 units subcutaneous at bedtime with sliding scale coverage. Check hemoglobin A1c. Adjust Lantus dose and sliding scale accordingly. I have requested nutritional consult to advise about diet. #2. A. fib with RVR presently rate controlled - Toprol dose was increased by cardiologist. Continue other medications. Not on Coumadin secondary to GI bleed and cirrhosis. Continue to monitor in telemetry. #3. Severe aortic stenosis and distally CHF - presently not short of breath. Further recommendations per cardiothoracic surgery and cardiologist. Continue present medications. Follow intake output and metabolic panel. #4. CAD status post CABG - denies any chest pain. #5. Cirrhosis of liver - presently not decompensated.  CODE STATUS - full code.  Eduard Clos 04/17/2012, 11:32 PM

## 2012-04-17 NOTE — Consult Note (Signed)
Anesthesia Chart Review:  Patient is a 66 year old male scheduled for a redo sternotomy for AVR on 04/18/12 by Dr. Dorris Fetch.  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  Primary Cardiologist is Dr. Earnestine Leys.  PCP is Dr. [name].  His Gastroenterologist is Dr. Lionel December.  He saw patient for a preoperative evaluation.  His recommendations included: "I do not see contraindication to proceeding with surgery for severe aortic stenosis with the understanding that there is some risk for hepatic decompensation. Patient and his wife understand that there is low but definite risk for hepatic decompensation particularly if perioperative course is prolonged or complicated. If short-term perioperative anti-coagulation is needed he would just need to be watched very closely for active GI bleed. Plan to see patient in office in 3 months or earlier if needed."  EKGs on 04/16/12 showed afib at a rate of 116 to 122, incomplete right BBB, possible inferior infarct (age undetermined), anterolateral ST/T wave abnormality, consider ischemia.  ST abnormality appears new/more pronounced then on his EKG from 03/02/12.  (He HR was 84 when his vitals were checked at the beginning of his PAT visit.)  TEE on 03/07/12 showed: - Left ventricle: Systolic function was normal. The estimated ejection fraction was in the range of 50% to 55%. - Aortic valve: There was severe stenosis. Mild regurgitation. - Mitral valve: Mild regurgitation. - Left atrium: No evidence of thrombus in the atrial cavity or appendage. - Right atrium: No evidence of thrombus in the atrial cavity or appendage. - Atrial septum: No defect or patent  foramen ovale was identified.  2D echo on 03/05/12 showed: - Left ventricle: The cavity size was normal. Wall thickness was normal. Systolic function was vigorous. The estimated ejection fraction was in the range of 65% to 70%. - Aortic valve: AV is thickened, calcified with restricted motion. Peak and mean gradients through the valve are 76 and 45 mm Hg respectively consistent with severe AS. consider TEE to define further as difficult to see well. Trivial regurgitation. - Mitral valve: Mild regurgitation.  Cardiac cath on 03/07/12 showed: 1. Moderate multivessel coronary artery disease as described.  (See Notes tab for full report.) 2. Status post multivessel coronary bypass surgery with continued patency of the LIMA to LAD, free radial to obtuse marginal, and saphenous vein graft to diagonal.  3. Vigorous LV systolic function, EF 65-70%. 4. Severe aortic stenosis.  Carotid duplex on 04/16/12 showed: - No significant extracranial carotid artery stenosis demonstrated. Vertebrals are patent with antegrade flow. - Right ICA / CCA ratio = 0.87. Left ICA / CCA ratio = 0.97.  CXR report on 04/16/12 showed: The cardiac shadow is stable. Postoperative changes are  seen. The lungs are well aerated without focal infiltrate or  sizable effusion. Calcified granulomas are again noted in the bases bilaterally.  PFTs on 03/09/12 showed: Pulmonary function testing 03/09/2012  FEV1 2.68 (83% predicted)  FVC 3.68 (85%)  FEV1 to FVC ratio 73%  FEF 25-75 1.78 (68%)  DLCO 111%  Labs noted.  ABG shows a pO2 56.9, pCO2 35.8, pH 7.457.  H/H 12.3/38.6.  PLT 140.  PT/PTT WNL.  Mean plasma glucose is documented as 217, glucose as 536, and A1C of  9.2.    I called patient's labs/EKG to Ryan at [facility].  She will have Dr. Dorris Fetch review at the office today.    Shonna Chock, PA-C

## 2012-04-17 NOTE — ED Notes (Signed)
Pt sent here by cards for admission for hyperglycemia; pt was having pre op labs and found CBG over 500; pt to have valve replacement which has been postponed for now; Dr Excell Seltzer notified that pt is here

## 2012-04-17 NOTE — ED Provider Notes (Addendum)
History    66 year old male presenting to the ER for further evaluation of hyperglycemia and tachycardia. Patient has a history of severe aortic stenosis. He is scheduled to have an aortic valve replacement tomorrow. Hyperglycemia was noted on preop labs. He is also noted to be tachycardic in the 120s  EKG. Patient does have a history of atrial fibrillation. He is on verapamil, digoxin and metoprolol. Reports compliance with his medications. Followed by [facility] cardiology. Patient currently has no complaints.  CSN: [id number]  Arrival date & time 04/17/12  1348   First MD Initiated Contact with Patient 04/17/12 1506      Chief Complaint  Patient presents with  . Abnormal Lab  . Hyperglycemia    (Consider location/radiation/quality/duration/timing/severity/associated sxs/prior treatment) HPI  Past Medical History  Diagnosis Date  . Overweight   . Coronary atherosclerosis of artery bypass graft   . Atrial fibrillation     Permanent; off of Coumadin for now due to GI bleed  . Type II or unspecified type diabetes mellitus without mention of complication, not stated as uncontrolled   . Insomnia   . Carotid bruit 06/14/2011  . Angina   . Hypertension   . CHF (congestive heart failure)   . GERD (gastroesophageal reflux disease)   . COPD (chronic obstructive pulmonary disease)   . Iron deficiency anemia     Requiring intravenous iron  . H/O hiatal hernia   . AVM (arteriovenous malformation)     Recurrent GI bleeding requiring multiple transfusions  . Hyperlipidemia   . Thrombocytopenia   . Ascites     status post paracentesis with removal of 3.4 L of ascitic fluid  . Osteoarthritis   . Mediastinal adenopathy 09/22/2011  . Cirrhosis   . Iron deficiency anemia   . Mediastinal adenopathy 09/22/2011  . Chronic diastolic CHF (congestive heart failure) 03/02/2012  . Ascites 09/20/2011  . Permanent atrial fibrillation 03/02/2012  . Aortic stenosis 03/08/2012  . Thrombocytopenia due to  hypersplenism 03/08/2012  . 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    LIMA to LAD, LRA to OM Branch, SVG to Diagonal Branch - Dr Dorris Fetch  . Shortness of breath     WITH EXERTION (WALKING)  . OSA (obstructive sleep apnea) 1999    DOES NOT USE CPAP  . Heart murmur     Past Surgical History  Procedure Date  . Coronary artery bypass graft  10/15/2002     [facility]. Dorris Fetch, M.D.     . Carpal tunnel release   10/08/2003  . Lipoma surgery   . Hernia repair   . Other surgical history 08/26/2011    [facility],  enteroscopy , revealing "three-four AVMs."   . Tee without cardioversion 03/07/2012    Procedure: TRANSESOPHAGEAL ECHOCARDIOGRAM (TEE);  Surgeon: Vesta Mixer, MD;  Location: [facility] ENDOSCOPY;  Service: Cardiovascular;  Laterality: N/A;  . Coronary angioplasty with stent placement 01/19/2005    drug eluting stent to high grade ostial stenosis of radial artery graft to OM    Family History  Problem Relation Age of Onset  . Coronary artery disease      FAMILY HISTORY  . Hypertension Father   . Diabetes Father   . Heart disease Father   . COPD Sister   . Cancer Maternal Aunt     Breast cancer   . Cancer Maternal Aunt     Breast cancer   . Diabetes Son   . Cancer Daughter     Cervical cancer    History  Substance  Use Topics  . Smoking status: Former Smoker -- 1.0 packs/day for 30 years    Types: Cigarettes    Quit date: 07/25/2000  . Smokeless tobacco: Never Used  . Alcohol Use: 0.6 oz/week    1 Shots of liquor per week     Occasionally      Review of Systems  Allergies  Codeine; Diltiazem hcl; and Niacin  Home Medications   Current Outpatient Rx  Name Route Sig Dispense Refill  . VITAMIN C 1000 MG PO TABS Oral Take 1,000 mg by mouth 2 (two) times daily.    . ASPIRIN EC 81 MG PO TBEC Oral Take 1 tablet (81 mg total) by mouth daily.    . ATORVASTATIN CALCIUM 20 MG PO TABS Oral Take 20 mg by mouth at bedtime.     [name] DIGOXIN 0.25 MG PO TABS Oral Take 1 tablet (250 mcg total)  by mouth daily. 90 tablet 3  . FERROUS GLUCONATE 325 MG PO TABS Oral Take 325 mg by mouth 3 (three) times daily with meals.    . FUROSEMIDE 80 MG PO TABS Oral Take 80 mg by mouth 2 (two) times daily.    [name] GLIMEPIRIDE 4 MG PO TABS Oral Take 4 mg by mouth daily with supper.     . ISOSORBIDE MONONITRATE ER 30 MG PO TB24 Oral Take 1 tablet (30 mg total) by mouth daily. 30 tablet 11  . LINAGLIPTIN 5 MG PO TABS Oral Take 1 tablet (5 mg total) by mouth daily. 30 tablet 6  . LISINOPRIL 10 MG PO TABS Oral Take 1 tablet (10 mg total) by mouth daily. 90 tablet 3  . METFORMIN HCL 1000 MG PO TABS Oral Take 1,000 mg by mouth 2 (two) times daily with a meal.     . METOPROLOL TARTRATE 50 MG PO TABS Oral Take 75 mg by mouth 2 (two) times daily.    [name] ONE-DAILY MULTI VITAMINS PO TABS Oral Take 1 tablet by mouth daily.     [name] NITROGLYCERIN 0.4 MG SL SUBL Sublingual Place 0.4 mg under the tongue every 5 (five) minutes x 3 doses as needed. For chest pain    . FISH OIL 1200 MG PO CAPS Oral Take 1,200 mg by mouth 2 (two) times daily.     [name] OMEPRAZOLE 20 MG PO CPDR Oral Take 40 mg by mouth daily.     [name] POTASSIUM CHLORIDE ER 10 MEQ PO TBCR Oral Take 30 mEq by mouth 2 (two) times daily.    [name] SITAGLIPTIN PHOSPHATE 100 MG PO TABS Oral Take 100 mg by mouth daily.     [name] VERAPAMIL HCL 80 MG PO TABS Oral Take 80 mg by mouth 3 (three) times daily.      BP 135/65  Pulse 76  Resp 22  Ht 5\' 8"  (1.727 m)  Wt 261 lb (118.389 kg)  BMI 39.68 kg/m2  SpO2 97%  Physical Exam  Nursing note and vitals reviewed. Constitutional: He appears well-developed and well-nourished. No distress.       Laying in bed. No acute distress. Obese.  HENT:  Head: Normocephalic and atraumatic.  Eyes: Conjunctivae normal are normal. Pupils are equal, round, and reactive to light. Right eye exhibits no discharge. Left eye exhibits no discharge.  Neck: Neck supple.  Cardiovascular: Exam reveals no gallop and no friction rub.   Murmur heard.       Irregularly irregular. Tachycardic. Heart rate from approximately 100-120 on the monitor. Systolic crescendo/decrescendo murmur.  Pulmonary/Chest:  Effort normal and breath sounds normal. No respiratory distress.  Abdominal: Soft. He exhibits no distension. There is no tenderness.  Musculoskeletal: He exhibits no edema and no tenderness.  Neurological: He is alert.  Skin: Skin is warm and dry. He is not diaphoretic.  Psychiatric: He has a normal mood and affect. His behavior is normal. Thought content normal.    ED Course  Procedures (including critical care time)  Labs Reviewed  CBC WITH DIFFERENTIAL - Abnormal; Notable for the following:    Platelets 129 (*)     All other components within normal limits  BASIC METABOLIC PANEL - Abnormal; Notable for the following:    Sodium 132 (*)     Chloride 89 (*)     Glucose, Bld 429 (*)     GFR calc non Af Amer 86 (*)     All other components within normal limits  URINALYSIS, ROUTINE W REFLEX MICROSCOPIC - Abnormal; Notable for the following:    APPearance CLOUDY (*)     Glucose, UA >1000 (*)     All other components within normal limits  GLUCOSE, CAPILLARY - Abnormal; Notable for the following:    Glucose-Capillary 407 (*)     All other components within normal limits  PROTIME-INR  URINE MICROSCOPIC-ADD ON   Dg Chest 2 View  04/16/2012  *RADIOLOGY REPORT*  Clinical Data: Anemia, hypertension and diabetes, preoperative evaluation for aortic valve replacement.  Shortness of breath on exertion.  CHEST - 2 VIEW  Comparison: 03/09/2012  Findings: The cardiac shadow is stable.  Postoperative changes are seen.   The lungs are well aerated without focal infiltrate or sizable effusion. Calcified granulomas are again noted in the bases bilaterally.  IMPRESSION: No acute abnormality is seen.   Original Report Authenticated By: Phillips Odor, M.D.    EKG:  Rhythm: Atrial fibrillation Rate: 81 Axis: Normal Intervals: NS Intraventricular delay ST  segments: NS ST changes   1. Hyperglycemia   2. Atrial fibrillation with RVR       MDM  65yM with hyperglycemia and afib with RVR.  No acidosis, gap or ketonuria. Could reasonably get pt's blood sugar and HR under control in ED and be discharged. Given that supposed to have surgery though for AVR may be more prudent to admit.  Will discuss with CTS. Basic labs ordered per protocol. Will check digoxin level. Verapamil for rate control. Insulin.        Raeford Razor, MD 04/17/12 [phone]

## 2012-04-18 ENCOUNTER — Encounter (HOSPITAL_COMMUNITY): Admission: RE | Payer: Self-pay | Source: Ambulatory Visit

## 2012-04-18 ENCOUNTER — Encounter (HOSPITAL_COMMUNITY): Payer: Self-pay | Admitting: *Deleted

## 2012-04-18 ENCOUNTER — Ambulatory Visit (HOSPITAL_COMMUNITY)
Admission: RE | Admit: 2012-04-18 | Payer: Managed Care, Other (non HMO) | Source: Ambulatory Visit | Admitting: Thoracic Surgery (Cardiothoracic Vascular Surgery)

## 2012-04-18 LAB — COMPREHENSIVE METABOLIC PANEL
Alkaline Phosphatase: 77 U/L (ref 39–117)
BUN: 19 mg/dL (ref 6–23)
CO2: 26 mEq/L (ref 19–32)
Chloride: 94 mEq/L — ABNORMAL LOW (ref 96–112)
GFR calc Af Amer: >90 mL/min (ref >90)
GFR calc non Af Amer: >90 mL/min (ref >90)
Glucose, Bld: 303 mg/dL — ABNORMAL HIGH (ref 70–99)
Potassium: 4.1 mEq/L (ref 3.5–5.1)
Total Bilirubin: 0.5 mg/dL (ref 0.3–1.2)
Total Protein: 7.1 g/dL (ref 6.0–8.3)

## 2012-04-18 LAB — TSH: TSH: 2.922 u[IU]/mL (ref 0.350–4.500)

## 2012-04-18 LAB — CBC WITH DIFFERENTIAL/PLATELET
Basophils Absolute: 0.1 10*3/uL (ref 0.0–0.1)
Basophils Relative: 1 % (ref 0–1)
Lymphocytes Relative: 22 % (ref 12–46)
MCHC: 33.2 g/dL (ref 30.0–36.0)
Neutro Abs: 3.3 10*3/uL (ref 1.7–7.7)
Neutrophils Relative %: 64 % (ref 43–77)
Platelets: 122 10*3/uL — ABNORMAL LOW (ref 150–400)
RDW: 15.3 % (ref 11.5–15.5)
WBC: 5.1 10*3/uL (ref 4.0–10.5)

## 2012-04-18 LAB — POCT I-STAT TROPONIN I

## 2012-04-18 LAB — HEMOGLOBIN A1C: Mean Plasma Glucose: 209 mg/dL — ABNORMAL HIGH (ref <117)

## 2012-04-18 LAB — GLUCOSE, CAPILLARY
Glucose-Capillary: 311 mg/dL — ABNORMAL HIGH (ref 70–99)
Glucose-Capillary: 376 mg/dL — ABNORMAL HIGH (ref 70–99)

## 2012-04-18 SURGERY — REPLACEMENT, AORTIC VALVE, OPEN
Anesthesia: General

## 2012-04-18 MED ORDER — FERROUS GLUCONATE 324 (38 FE) MG PO TABS
325.0000 mg | ORAL_TABLET | Freq: Three times a day (TID) | ORAL | Status: DC
  Filled 2012-04-18: qty 1

## 2012-04-18 MED ORDER — ONDANSETRON HCL 4 MG/2ML IJ SOLN: 4.0000 mg | Freq: Four times a day (QID) | INTRAMUSCULAR | Status: DC | PRN

## 2012-04-18 MED ORDER — DIGOXIN 250 MCG PO TABS
250.0000 ug | ORAL_TABLET | Freq: Every day | ORAL | Status: DC
  Administered 2012-04-18 – 2012-04-20 (×3): 250 ug via ORAL
  Filled 2012-04-18 (×3): qty 1

## 2012-04-18 MED ORDER — ISOSORBIDE MONONITRATE ER 30 MG PO TB24
30.0000 mg | ORAL_TABLET | Freq: Every day | ORAL | Status: DC
  Administered 2012-04-18 – 2012-04-20 (×3): 30 mg via ORAL
  Filled 2012-04-18 (×3): qty 1

## 2012-04-18 MED ORDER — PANTOPRAZOLE SODIUM 40 MG PO TBEC
40.0000 mg | DELAYED_RELEASE_TABLET | Freq: Every day | ORAL | Status: DC
  Administered 2012-04-18 – 2012-04-20 (×3): 40 mg via ORAL
  Filled 2012-04-18 (×3): qty 1

## 2012-04-18 MED ORDER — OMEGA-3-ACID ETHYL ESTERS 1 G PO CAPS
1.0000 g | ORAL_CAPSULE | Freq: Two times a day (BID) | ORAL | Status: DC
  Administered 2012-04-18 – 2012-04-20 (×6): 1 g via ORAL
  Filled 2012-04-18 (×7): qty 1

## 2012-04-18 MED ORDER — SODIUM CHLORIDE 0.9 % IJ SOLN: 3.0000 mL | Freq: Two times a day (BID) | INTRAMUSCULAR | Status: DC

## 2012-04-18 MED ORDER — FUROSEMIDE 80 MG PO TABS
80.0000 mg | ORAL_TABLET | Freq: Two times a day (BID) | ORAL | Status: DC
  Administered 2012-04-18 – 2012-04-20 (×5): 80 mg via ORAL
  Filled 2012-04-18 (×8): qty 1

## 2012-04-18 MED ORDER — INSULIN GLARGINE 100 UNIT/ML ~~LOC~~ SOLN
5.0000 [IU] | Freq: Every day | SUBCUTANEOUS | Status: DC
  Administered 2012-04-18: 5 [IU] via SUBCUTANEOUS

## 2012-04-18 MED ORDER — ADULT MULTIVITAMIN W/MINERALS CH
1.0000 | ORAL_TABLET | Freq: Every day | ORAL | Status: DC
  Administered 2012-04-18 – 2012-04-20 (×3): 1 via ORAL
  Filled 2012-04-18 (×3): qty 1

## 2012-04-18 MED ORDER — ASPIRIN EC 81 MG PO TBEC
81.0000 mg | DELAYED_RELEASE_TABLET | Freq: Every day | ORAL | Status: DC
  Administered 2012-04-18 – 2012-04-20 (×3): 81 mg via ORAL
  Filled 2012-04-18 (×3): qty 1

## 2012-04-18 MED ORDER — INSULIN ASPART 100 UNIT/ML ~~LOC~~ SOLN
0.0000 [IU] | Freq: Three times a day (TID) | SUBCUTANEOUS | Status: DC
  Administered 2012-04-18 (×2): 7 [IU] via SUBCUTANEOUS
  Administered 2012-04-18: 9 [IU] via SUBCUTANEOUS
  Administered 2012-04-19: 3 [IU] via SUBCUTANEOUS

## 2012-04-18 MED ORDER — VERAPAMIL HCL 80 MG PO TABS
80.0000 mg | ORAL_TABLET | Freq: Three times a day (TID) | ORAL | Status: DC
Start: 2012-04-18 — End: 2012-04-20
  Administered 2012-04-18 – 2012-04-20 (×8): 80 mg via ORAL
  Filled 2012-04-18 (×11): qty 1

## 2012-04-18 MED ORDER — ONDANSETRON HCL 4 MG PO TABS: 4.0000 mg | ORAL_TABLET | Freq: Four times a day (QID) | ORAL | Status: DC | PRN

## 2012-04-18 MED ORDER — ACETAMINOPHEN 325 MG PO TABS: 650.0000 mg | ORAL_TABLET | Freq: Four times a day (QID) | ORAL | Status: DC | PRN

## 2012-04-18 MED ORDER — FERROUS GLUCONATE 324 (38 FE) MG PO TABS
324.0000 mg | ORAL_TABLET | Freq: Three times a day (TID) | ORAL | Status: DC
  Administered 2012-04-18 – 2012-04-20 (×8): 324 mg via ORAL
  Filled 2012-04-18 (×10): qty 1

## 2012-04-18 MED ORDER — INSULIN GLARGINE 100 UNIT/ML ~~LOC~~ SOLN: 25.0000 [IU] | Freq: Every day | SUBCUTANEOUS | Status: DC

## 2012-04-18 MED ORDER — ACETAMINOPHEN 650 MG RE SUPP: 650.0000 mg | Freq: Four times a day (QID) | RECTAL | Status: DC | PRN

## 2012-04-18 MED ORDER — METOPROLOL SUCCINATE ER 50 MG PO TB24
75.0000 mg | ORAL_TABLET | Freq: Two times a day (BID) | ORAL | Status: DC
  Administered 2012-04-18 – 2012-04-20 (×6): 75 mg via ORAL
  Filled 2012-04-18 (×7): qty 1

## 2012-04-18 MED ORDER — POTASSIUM CHLORIDE ER 10 MEQ PO TBCR
30.0000 meq | EXTENDED_RELEASE_TABLET | Freq: Two times a day (BID) | ORAL | Status: DC
  Administered 2012-04-18 – 2012-04-20 (×5): 30 meq via ORAL
  Filled 2012-04-18 (×6): qty 3

## 2012-04-18 MED ORDER — INFLUENZA VIRUS VACC SPLIT PF IM SUSP
0.5000 mL | INTRAMUSCULAR | Status: AC
  Administered 2012-04-18: 0.5 mL via INTRAMUSCULAR
  Filled 2012-04-18: qty 0.5

## 2012-04-18 MED ORDER — GLIMEPIRIDE 4 MG PO TABS
4.0000 mg | ORAL_TABLET | Freq: Every day | ORAL | Status: DC
  Administered 2012-04-18 – 2012-04-19 (×2): 4 mg via ORAL
  Filled 2012-04-18 (×3): qty 1

## 2012-04-18 MED ORDER — INSULIN DETEMIR 100 UNIT/ML ~~LOC~~ SOLN
25.0000 [IU] | Freq: Every day | SUBCUTANEOUS | Status: DC
  Administered 2012-04-18: 25 [IU] via SUBCUTANEOUS
  Filled 2012-04-18: qty 10

## 2012-04-18 MED ORDER — SODIUM CHLORIDE 0.9 % IJ SOLN
3.0000 mL | Freq: Two times a day (BID) | INTRAMUSCULAR | Status: DC
  Administered 2012-04-18 – 2012-04-20 (×5): 3 mL via INTRAVENOUS

## 2012-04-18 MED ORDER — ATORVASTATIN CALCIUM 20 MG PO TABS
20.0000 mg | ORAL_TABLET | Freq: Every day | ORAL | Status: DC
  Administered 2012-04-18 – 2012-04-19 (×3): 20 mg via ORAL
  Filled 2012-04-18 (×4): qty 1

## 2012-04-18 MED ORDER — FISH OIL 1200 MG PO CAPS: 1200.0000 mg | ORAL_CAPSULE | Freq: Two times a day (BID) | ORAL | Status: DC

## 2012-04-18 MED ORDER — NITROGLYCERIN 0.4 MG SL SUBL: 0.4000 mg | SUBLINGUAL_TABLET | SUBLINGUAL | Status: DC | PRN

## 2012-04-18 MED ORDER — LISINOPRIL 10 MG PO TABS
10.0000 mg | ORAL_TABLET | Freq: Every day | ORAL | Status: DC
Start: 2012-04-18 — End: 2012-04-20
  Administered 2012-04-18 – 2012-04-20 (×3): 10 mg via ORAL
  Filled 2012-04-18 (×3): qty 1

## 2012-04-18 NOTE — Progress Notes (Signed)
No complaints today BP 152/74  Pulse 67  Temp 97.9 F (36.6 C) (Oral)  Resp 16  Ht 5\' 8"  (1.727 m)  Wt 261 lb 11.2 oz (118.706 kg)  BMI 39.79 kg/m2  SpO2 97%  CBG (last 3)   Basename 04/18/12 0619 04/18/12 0133 04/17/12 2134  GLUCAP 312* 311* 302*    Exam unchanged  CBG remain persistently elevated. Surgery cancelled due to uncontrolled hyperglycemia. Will reschedule for next week, assuming CBg are adequately controlled

## 2012-04-18 NOTE — Progress Notes (Signed)
Cardiology Progress Note Patient Name: [name] Date of Encounter: 04/18/2012, 8:13 AM     Subjective  No overnight events. Patient denies chest pain, palpitations, or sob.   Objective   Telemetry: A.fib 60-80s  Medications: . aspirin EC  81 mg Oral Daily  . atorvastatin  20 mg Oral QHS  . digoxin  250 mcg Oral Daily  . ferrous gluconate  324 mg Oral TID WC  . furosemide  80 mg Oral BID  . glimepiride  4 mg Oral Q supper  . influenza  inactive virus vaccine  0.5 mL Intramuscular Tomorrow-1000  . insulin aspart  0-9 Units Subcutaneous TID WC  . insulin aspart  10 Units Intravenous Once  . insulin glargine  5 Units Subcutaneous QHS  . isosorbide mononitrate  30 mg Oral Daily  . lisinopril  10 mg Oral Daily  . metoprolol succinate  75 mg Oral BID  . multivitamin with minerals  1 tablet Oral Daily  . omega-3 acid ethyl esters  1 g Oral BID  . pantoprazole  40 mg Oral Q1200  . potassium chloride  30 mEq Oral BID  . sodium chloride  3 mL Intravenous Q12H  . verapamil  80 mg Oral Q8H  . verapamil  5 mg Intravenous Once      Physical Exam: Temp:  [97.8 F (36.6 C)-98.2 F (36.8 C)] 97.9 F (36.6 C) (09/25 0454) Pulse Rate:  [65-132] 76  (09/25 0454) Resp:  [10-22] 16  (09/25 0454) BP: (93-149)/(47-106) 129/83 mmHg (09/25 0454) SpO2:  [97 %-99 %] 98 % (09/25 0454) Weight:  [261 lb (118.389 kg)-261 lb 11.2 oz (118.706 kg)] 261 lb 11.2 oz (118.706 kg) (09/25 0112)  General: Obese white male in no acute distress.  Head: Normocephalic, atraumatic, sclera non-icteric, nares are without discharge  Neck: Supple. Negative for carotid bruits. JVD not elevated.  Lungs: Clear bilaterally to auscultation without wheezes, rales, or rhonchi. Breathing is unlabored.  Heart: Irregularly irregular with S1 S2. 3/6 systolic crescendo decrescendo murmur. No rubs or gallops appreciated.  Abdomen: Obese, Soft, non-tender, non-distended with normoactive bowel sounds. No  rebound/guarding. No obvious abdominal masses.  Msk: Strength and tone appear normal for age.  Extremities: No edema. No clubbing or cyanosis. Chronic skin discoloration to bilat shins. Distal pedal pulses are intact and equal bilaterally.  Neuro: Alert and oriented X 3. Moves all extremities spontaneously.  Psych: Responds to questions appropriately with a normal affect.     Intake/Output Summary (Last 24 hours) at 04/18/12 0813 Last data filed at 04/18/12 0155  Gross per 24 hour  Intake      3 ml  Output      0 ml  Net      3 ml    Labs:  [facility] 04/18/12 0625 04/17/12 1413  NA 135 132*  K 4.1 4.7  CL 94* 89*  CO2 26 29  GLUCOSE 303* 429*  BUN 19 20  CREATININE 0.81 0.96  CALCIUM 10.0 10.5   Basename 04/18/12 0625 04/16/12 1434  AST 49* 41*  ALT 49 50  ALKPHOS 77 90  BILITOT 0.5 0.4  PROT 7.1 7.9  ALBUMIN 3.8 4.2   Basename 04/18/12 0625 04/17/12 1413  WBC 5.1 5.7  NEUTROABS 3.3 3.9  HGB 12.5* 13.5  HCT 37.7* 41.2  MCV 85.9 86.7  PLT 122* 129*   Basename 04/16/12 1434  HGBA1C 9.2*    Radiology/Studies:   04/17/2012 - Chest 2 View Findings: Status  post median sternotomy and CABG.  Heart size upper normal.  No confluent airspace opacity.  No pleural effusion or pneumothorax.  No acute osseous finding.  Multilevel degenerative change.  IMPRESSION: No radiographic evidence of acute cardiopulmonary process.      Assessment and Plan  66 y.o. male w/ PMHx significant for CAD (s/p CABG x 3 in 2004, multiple cardiac caths +/- PCI), permanent atrial fibrillation (no anticoagulation 2/2 GIB & Cirrhosis), chronic diastolic CHF (EF 95%), hepatic cirrhosis (history of ascites), recurrent GIB secondary to AVM, Fe deficiency anemia and thrombocytopenia (secondary to hypersplenism), DMII, HTN, HLD and OSA who presented to [facility] on 04/17/2012 after being found to have hyperglycemia and.fib w/ rvr on preop work up.  1. Diabetes Mellitus, Type 2, Uncontrolled: A1C  9.2. Patient admits to dietary indiscretions and medication noncompliance. Admitted with blood sugars >500. No DKA. Still with CBGs 300s. Lengthy discussion on importance of compliance with diabetic diet and meds. Adjustment of medication regimen per primary team.  2. Atrial Fibrillation: Elevated rates in the ED, now improved with change from Lopressor to Toprol XL. No anticoagulation 2/2 h/o recurrent GIB and cirrhosis.  3. Aortic Stenosis, Severe: AVR postponed until next week due to #1.  4. Chronic Diastolic CHF: Stable. No overt volume overload.   5. Coronary Artery Disease: Stable. Continue meds  Signed, Layla Kesling PA-C

## 2012-04-18 NOTE — Progress Notes (Signed)
[facility] PROGRESS NOTE  [name] ZOX:[id number] DOB: Aug 21, 1945 DOA: 04/17/2012 PCP: [name], MD  Assessment/Plan: Principal Problem:  *DM2 (diabetes mellitus, type 2) Active Problems:  AVM (arteriovenous malformation)  Hyperlipidemia  CHF (congestive heart failure)  Cirrhosis  Type 2 diabetes mellitus  Aortic stenosis  S/P CABG x 3  Atrial fibrillation with RVR  Hyperglycemia without ketosis   #1. Uncontrolled diabetes mellitus2 - at this time we will hold off patient's metformin and Tradjenta. Increase dose of Lantus and CBG still uncontrolled, hemoglobin A1c is 9.2. Adjust Lantus dose and sliding scale accordingly. I have requested nutritional consult to advise about diet.  #2. A. fib with RVR presently rate controlled - Toprol dose was increased by cardiologist. Continue other medications. Not on Coumadin secondary to GI bleed and cirrhosis. Continue to monitor in telemetry.  #3. Severe aortic stenosis and distally CHF - presently not short of breath. Further recommendations per cardiothoracic surgery and cardiologist. Continue present medications. Follow intake output and metabolic panel. Asymptomatic currently #4. CAD status post CABG - denies any chest pain.  #5. Cirrhosis of liver - presently not decompensated  #6 thrombocytopenia secondary to hypersplenism platelets stable #7 anemia stable  HPI/Subjective: No overnight events. Patient denies chest pain, palpitations, or sob.   Objective: Filed Vitals:   04/17/12 1815 04/17/12 2049 04/18/12 0112 04/18/12 0454  BP: 102/47 124/64 149/106 129/83  Pulse: 70 66 95 76  Temp:  97.8 F (36.6 C) 98.2 F (36.8 C) 97.9 F (36.6 C)  TempSrc:  Oral Oral Oral  Resp: 20 18 18 16   Height:   5\' 8"  (1.727 m)   Weight:   118.706 kg (261 lb 11.2 oz)   SpO2: 99% 97% 98% 98%    Intake/Output Summary (Last 24 hours) at 04/18/12 0851 Last data filed at 04/18/12 0155  Gross per 24 hour  Intake      3 ml    Output      0 ml  Net      3 ml    Exam:  General: Obese white male in no acute distress.  Head: Normocephalic, atraumatic, sclera non-icteric, nares are without discharge  Neck: Supple. Negative for carotid bruits. JVD not elevated.  Lungs: Clear bilaterally to auscultation without wheezes, rales, or rhonchi. Breathing is unlabored.  Heart: Irregularly irregular with S1 S2. 3/6 systolic crescendo decrescendo murmur. No rubs or gallops appreciated.  Abdomen: Obese, Soft, non-tender, non-distended with normoactive bowel sounds. No rebound/guarding. No obvious abdominal masses.  Msk: Strength and tone appear normal for age.  Extremities: No edema. No clubbing or cyanosis. Chronic skin discoloration to bilat shins. Distal pedal pulses are intact and equal bilaterally.  Neuro: Alert and oriented X 3. Moves all extremities spontaneously.  Psych: Responds to questions appropriately with a normal affect.    Data Reviewed: Basic Metabolic Panel:  Lab 04/18/12 8119 04/17/12 1413 04/16/12 1434  NA 135 132* 132*  K 4.1 4.7 4.6  CL 94* 89* 90*  CO2 26 29 25   GLUCOSE 303* 429* 536*  BUN 19 20 19   CREATININE 0.81 0.96 0.91  CALCIUM 10.0 10.5 10.1  MG -- -- --  PHOS -- -- --    Liver Function Tests:  Lab 04/18/12 0625 04/16/12 1434  AST 49* 41*  ALT 49 50  ALKPHOS 77 90  BILITOT 0.5 0.4  PROT 7.1 7.9  ALBUMIN 3.8 4.2   No results found for this basename: LIPASE:5,AMYLASE:5 in the last 168 hours No results found for  this basename: AMMONIA:5 in the last 168 hours  CBC:  Lab 04/18/12 0625 04/17/12 1413 04/16/12 1434  WBC 5.1 5.7 5.8  NEUTROABS 3.3 3.9 --  HGB 12.5* 13.5 12.3*  HCT 37.7* 41.2 38.6*  MCV 85.9 86.7 87.3  PLT 122* 129* 140*    Cardiac Enzymes: No results found for this basename: CKTOTAL:5,CKMB:5,CKMBINDEX:5,TROPONINI:5 in the last 168 hours BNP (last 3 results)  Basename 03/02/12 1531 05/27/11 1952 04/29/11 1813  PROBNP 804.8* 920.8* 737.1*      CBG:  Lab 04/18/12 0619 04/18/12 0133 04/17/12 2134 04/17/12 1719 04/17/12 1425  GLUCAP 312* 311* 302* 299* 407*    Recent Results (from the past 240 hour(s))  SURGICAL PCR SCREEN     Status: Normal   Collection Time   04/16/12  2:37 PM      Component Value Range Status Comment   MRSA, PCR NEGATIVE  NEGATIVE Final    Staphylococcus aureus NEGATIVE  NEGATIVE Final      Studies: Dg Chest 2 View  04/17/2012  *RADIOLOGY REPORT*  Clinical Data: Hyperglycemia  CHEST - 2 VIEW  Comparison: 04/16/2012  Findings: Status post median sternotomy and CABG.  Heart size upper normal.  No confluent airspace opacity.  No pleural effusion or pneumothorax.  No acute osseous finding.  Multilevel degenerative change.  IMPRESSION: No radiographic evidence of acute cardiopulmonary process.   Original Report Authenticated By: Waneta Martins, M.D.    Dg Chest 2 View  04/16/2012  *RADIOLOGY REPORT*  Clinical Data: Anemia, hypertension and diabetes, preoperative evaluation for aortic valve replacement.  Shortness of breath on exertion.  CHEST - 2 VIEW  Comparison: 03/09/2012  Findings: The cardiac shadow is stable.  Postoperative changes are seen.   The lungs are well aerated without focal infiltrate or sizable effusion. Calcified granulomas are again noted in the bases bilaterally.  IMPRESSION: No acute abnormality is seen.   Original Report Authenticated By: Phillips Odor, M.D.     Scheduled Meds:   . aspirin EC  81 mg Oral Daily  . atorvastatin  20 mg Oral QHS  . digoxin  250 mcg Oral Daily  . ferrous gluconate  324 mg Oral TID WC  . furosemide  80 mg Oral BID  . glimepiride  4 mg Oral Q supper  . influenza  inactive virus vaccine  0.5 mL Intramuscular Tomorrow-1000  . insulin aspart  0-9 Units Subcutaneous TID WC  . insulin aspart  10 Units Intravenous Once  . insulin glargine  5 Units Subcutaneous QHS  . isosorbide mononitrate  30 mg Oral Daily  . lisinopril  10 mg Oral Daily  . metoprolol  succinate  75 mg Oral BID  . multivitamin with minerals  1 tablet Oral Daily  . omega-3 acid ethyl esters  1 g Oral BID  . pantoprazole  40 mg Oral Q1200  . potassium chloride  30 mEq Oral BID  . sodium chloride  3 mL Intravenous Q12H  . verapamil  80 mg Oral Q8H  . verapamil  5 mg Intravenous Once  . DISCONTD: ferrous gluconate  325 mg Oral TID WC  . DISCONTD: Fish Oil  1,200 mg Oral BID  . DISCONTD: sodium chloride  3 mL Intravenous Q12H   Continuous Infusions:   Principal Problem:  *DM2 (diabetes mellitus, type 2) Active Problems:  AVM (arteriovenous malformation)  Hyperlipidemia  CHF (congestive heart failure)  Cirrhosis  Type 2 diabetes mellitus  Aortic stenosis  S/P CABG x 3  Atrial fibrillation with RVR  Hyperglycemia without ketosis    Time spent: 40 minutes   [facility]  [facility] Pager [phone]. If 8PM-8AM, please contact night-coverage at [email or web address], password [facility] 04/18/2012, 8:51 AM  LOS: 1 day

## 2012-04-18 NOTE — Plan of Care (Signed)
Problem: Limited Adherence to Nutrition-Related Recommendations (NB-1.6) Goal: Nutrition education Formal process to instruct or train a patient/client in a skill or to impart knowledge to help patients/clients voluntarily manage or modify food choices and eating behavior to maintain or improve health.  Outcome: Completed/Met Date Met:  04/18/12  RD consulted for nutrition education regarding diabetes.     Lab Results  Component Value Date    HGBA1C 8.9* 04/18/2012    RD provided "Carbohydrate Counting for People with Diabetes" handout from the Academy of [employer] and Dietetics. Discussed different food groups and their effects on blood sugar, emphasizing carbohydrate-containing foods. Provided list of carbohydrates and recommended serving sizes of common foods.  Discussed importance of controlled and consistent carbohydrate intake throughout the day. Provided examples of ways to balance meals/snacks and encouraged intake of high-fiber, whole grain complex carbohydrates.  Pt states that he usually overeats at meals, and will work to reduce portion sizes. Wife, also in room, states that she has measuring utensils at home and will measure foods and help husband with counting carbohydrates. RN states that pt also has increased sodium and fluid intake, RN has gone over CHF booklet with pt and wife. No questions on DM or CHF diet at this time. At end of education pt was able to recall number of grams of carbohydrates in a serving, and number of servings per meal/snack.   Expect fair compliance.  Body mass index is 39.79 kg/(m^2). Pt meets criteria for obesity class 2 based on current BMI.  Current diet order is carb mod medium, patient is consuming approximately 100% of meals at this time. Labs and medications reviewed. No further nutrition interventions warranted at this time. RD contact information provided. If additional nutrition issues arise, please re-consult RD.  Clarene Duke RD,  LDN Pager [phone] After Hours pager [phone]

## 2012-04-18 NOTE — Progress Notes (Signed)
Patient seen, examined. Available data reviewed. Agree with findings, assessment, and plan as outlined by [facility], PA-C. I have independently interviewed and examined the patient. His atrial fib is currently well controlled. Medication changes noted as above. Plans for upcoming aortic valve replacement per Dr. Dorris Fetch. Adjustments are being made to his diabetes regimen.   Tonny Bollman, M.D. 04/18/2012 4:57 PM

## 2012-04-18 NOTE — Progress Notes (Signed)
Inpatient Diabetes Program Recommendations  AACE/ADA: New Consensus Statement on Inpatient Glycemic Control (2013)  Target Ranges:  Prepandial:   less than 140 mg/dL      Peak postprandial:   less than 180 mg/dL (1-2 hours)      Critically ill patients:  140 - 180 mg/dL   Reason for Visit: Hyperglycemia  Inpatient Diabetes Program Recommendations Correction (SSI): Please increase correction to resistant tidwc and add HS correction scale  Note: Thank you, Lenor Coffin, RN, CNS, Diabetes Coordinator [phone])

## 2012-04-19 ENCOUNTER — Other Ambulatory Visit: Payer: Self-pay | Admitting: *Deleted

## 2012-04-19 DIAGNOSIS — I359 Nonrheumatic aortic valve disorder, unspecified: Secondary | ICD-10-CM

## 2012-04-19 LAB — GLUCOSE, CAPILLARY
Glucose-Capillary: 235 mg/dL — ABNORMAL HIGH (ref 70–99)
Glucose-Capillary: 273 mg/dL — ABNORMAL HIGH (ref 70–99)
Glucose-Capillary: 316 mg/dL — ABNORMAL HIGH (ref 70–99)
Glucose-Capillary: 323 mg/dL — ABNORMAL HIGH (ref 70–99)
Glucose-Capillary: 275 mg/dL — ABNORMAL HIGH (ref 70–99)

## 2012-04-19 LAB — BASIC METABOLIC PANEL
Calcium: 10.2 mg/dL (ref 8.4–10.5)
Creatinine, Ser: 0.87 mg/dL (ref 0.50–1.35)
GFR calc Af Amer: >90 mL/min (ref >90)
GFR calc non Af Amer: 90 mL/min — ABNORMAL LOW (ref >90)

## 2012-04-19 MED ORDER — INSULIN GLARGINE 100 UNIT/ML ~~LOC~~ SOLN
15.0000 [IU] | Freq: Once | SUBCUTANEOUS | Status: AC
  Administered 2012-04-19: 15 [IU] via SUBCUTANEOUS

## 2012-04-19 MED ORDER — INSULIN ASPART 100 UNIT/ML ~~LOC~~ SOLN
0.0000 [IU] | Freq: Three times a day (TID) | SUBCUTANEOUS | Status: DC
  Administered 2012-04-19: 11 [IU] via SUBCUTANEOUS
  Administered 2012-04-19: 15 [IU] via SUBCUTANEOUS
  Administered 2012-04-20: 11 [IU] via SUBCUTANEOUS
  Administered 2012-04-20: 4 [IU] via SUBCUTANEOUS

## 2012-04-19 MED ORDER — INSULIN ASPART 100 UNIT/ML ~~LOC~~ SOLN
0.0000 [IU] | Freq: Every day | SUBCUTANEOUS | Status: DC
  Administered 2012-04-19: 2 [IU] via SUBCUTANEOUS

## 2012-04-19 MED ORDER — INSULIN DETEMIR 100 UNIT/ML ~~LOC~~ SOLN
50.0000 [IU] | Freq: Every day | SUBCUTANEOUS | Status: DC
  Administered 2012-04-20: 50 [IU] via SUBCUTANEOUS
  Filled 2012-04-19: qty 10

## 2012-04-19 MED ORDER — INSULIN DETEMIR 100 UNIT/ML ~~LOC~~ SOLN
35.0000 [IU] | Freq: Every day | SUBCUTANEOUS | Status: DC
Start: 2012-04-19 — End: 2012-04-19
  Administered 2012-04-19: 35 [IU] via SUBCUTANEOUS
  Filled 2012-04-19: qty 10

## 2012-04-19 NOTE — Progress Notes (Signed)
Pts HR went up to 140 nonsustained while pt was walking in room.  Pt asymptomatic and BP is 122/64 HR now 80.  Dr. Orson Aloe notified.  No new orders given.  Will continue to monitor.

## 2012-04-19 NOTE — Progress Notes (Addendum)
[facility] PROGRESS NOTE  [name] ZOX:[id number] DOB: [birth year] DOA: 04/17/2012 PCP: [name], MD  Assessment/Plan: Principal Problem:  *DM2 (diabetes mellitus, type 2) Active Problems:  AVM (arteriovenous malformation)  Hyperlipidemia  CHF (congestive heart failure)  Cirrhosis  Type 2 diabetes mellitus  Aortic stenosis  S/P CABG x 3  Atrial fibrillation with RVR  Hyperglycemia without ketosis      1. Uncontrolled diabetes mellitus2 - still uncontrolled, at this time we will hold off patient's metformin and Tradjenta. Increase dose of Lantus to 50 units, change to resistance sliding scale, at each of coverage CBG still uncontrolled, hemoglobin A1c is 8.9. Adjust Lantus dose and sliding scale accordingly. I have requested nutritional consult to advise about diet.   #2. A. fib with RVR presently rate controlled - Toprol dose was increased by cardiologist. Continue other medications. Not on Coumadin secondary to GI bleed and cirrhosis. Continue to monitor in telemetry.   #3. Severe aortic stenosis and distally CHF - presently not short of breath. Further recommendations per cardiothoracic surgery and cardiologist. Surgery and hold because of uncontrolled CBG, Continue present medications. Follow intake output and metabolic panel. Asymptomatic currently   #4. CAD status post CABG - denies any chest pain.  #5. 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 Patient denies chest pain, palpitations, or sob  Objective: Filed Vitals:   04/18/12 0959 04/18/12 1300 04/18/12 2136 04/19/12 0603  BP: 152/74 99/72 126/75 120/75  Pulse: 67 69 66 65  Temp:  97.6 F (36.4 C) 98 F (36.7 C) 97.6 F (36.4 C)  TempSrc:  Oral Oral Oral  Resp: 16 18 18 19   Height:      Weight:    117 kg (257 lb 15 oz)  SpO2: 97% 97% 100% 98%    Intake/Output  Summary (Last 24 hours) at 04/19/12 0823 Last data filed at 04/19/12 0102  Gross per 24 hour  Intake   1040 ml  Output   3750 ml  Net  -2710 ml    Exam:  HENT:  Head: Atraumatic.  Nose: Nose normal.  Mouth/Throat: Oropharynx is clear and moist.  Eyes: Conjunctivae are normal. Pupils are equal, round, and reactive to light. No scleral icterus.  Neck: Neck supple. No tracheal deviation present.  Cardiovascular: Normal rate, regular rhythm, normal heart sounds and intact distal pulses.  Pulmonary/Chest: Effort normal and breath sounds normal. No respiratory distress.  Abdominal: Soft. Normal appearance and bowel sounds are normal. She exhibits no distension. There is no tenderness.  Musculoskeletal: She exhibits no edema and no tenderness.  Neurological: She is alert. No cranial nerve deficit.    Data Reviewed: Basic Metabolic Panel:  Lab 04/18/12 8119 04/17/12 1413 04/16/12 1434  NA 135 132* 132*  K 4.1 4.7 4.6  CL 94* 89* 90*  CO2 26 29 25   GLUCOSE 303* 429* 536*  BUN 19 20 19   CREATININE 0.81 0.96 0.91  CALCIUM 10.0 10.5 10.1  MG -- -- --  PHOS -- -- --    Liver Function Tests:  Lab 04/18/12 0625 04/16/12 1434  AST 49* 41*  ALT 49 50  ALKPHOS 77 90  BILITOT 0.5 0.4  PROT 7.1 7.9  ALBUMIN 3.8 4.2   No results found for this basename: LIPASE:5,AMYLASE:5 in the last 168 hours No results found for this basename: AMMONIA:5 in the last 168 hours  CBC:  Lab 04/18/12  1610 04/17/12 1413 04/16/12 1434  WBC 5.1 5.7 5.8  NEUTROABS 3.3 3.9 --  HGB 12.5* 13.5 12.3*  HCT 37.7* 41.2 38.6*  MCV 85.9 86.7 87.3  PLT 122* 129* 140*    Cardiac Enzymes: No results found for this basename: CKTOTAL:5,CKMB:5,CKMBINDEX:5,TROPONINI:5 in the last 168 hours BNP (last 3 results)  Basename 03/02/12 1531 05/27/11 1952 04/29/11 1813  PROBNP 804.8* 920.8* 737.1*     CBG:  Lab 04/19/12 0555 04/19/12 0018 04/18/12 2134 04/18/12 1607 04/18/12 1141  GLUCAP 235* 273* 316* 376*  331*    Recent Results (from the past 240 hour(s))  SURGICAL PCR SCREEN     Status: Normal   Collection Time   04/16/12  2:37 PM      Component Value Range Status Comment   MRSA, PCR NEGATIVE  NEGATIVE Final    Staphylococcus aureus NEGATIVE  NEGATIVE Final      Studies: Dg Chest 2 View  04/17/2012  *RADIOLOGY REPORT*  Clinical Data: Hyperglycemia  CHEST - 2 VIEW  Comparison: 04/16/2012  Findings: Status post median sternotomy and CABG.  Heart size upper normal.  No confluent airspace opacity.  No pleural effusion or pneumothorax.  No acute osseous finding.  Multilevel degenerative change.  IMPRESSION: No radiographic evidence of acute cardiopulmonary process.   Original Report Authenticated By: Waneta Martins, M.D.    Dg Chest 2 View  04/16/2012  *RADIOLOGY REPORT*  Clinical Data: Anemia, hypertension and diabetes, preoperative evaluation for aortic valve replacement.  Shortness of breath on exertion.  CHEST - 2 VIEW  Comparison: 03/09/2012  Findings: The cardiac shadow is stable.  Postoperative changes are seen.   The lungs are well aerated without focal infiltrate or sizable effusion. Calcified granulomas are again noted in the bases bilaterally.  IMPRESSION: No acute abnormality is seen.   Original Report Authenticated By: Phillips Odor, M.D.     Scheduled Meds:   . aspirin EC  81 mg Oral Daily  . atorvastatin  20 mg Oral QHS  . digoxin  250 mcg Oral Daily  . ferrous gluconate  324 mg Oral TID WC  . furosemide  80 mg Oral BID  . glimepiride  4 mg Oral Q supper  . influenza  inactive virus vaccine  0.5 mL Intramuscular Tomorrow-1000  . insulin aspart  0-9 Units Subcutaneous TID WC  . insulin detemir  25 Units Subcutaneous Daily  . isosorbide mononitrate  30 mg Oral Daily  . lisinopril  10 mg Oral Daily  . metoprolol succinate  75 mg Oral BID  . multivitamin with minerals  1 tablet Oral Daily  . omega-3 acid ethyl esters  1 g Oral BID  . pantoprazole  40 mg Oral Q1200  .  potassium chloride  30 mEq Oral BID  . sodium chloride  3 mL Intravenous Q12H  . verapamil  80 mg Oral Q8H  . DISCONTD: insulin glargine  25 Units Subcutaneous Daily  . DISCONTD: insulin glargine  5 Units Subcutaneous QHS   Continuous Infusions:   Principal Problem:  *DM2 (diabetes mellitus, type 2) Active Problems:  AVM (arteriovenous malformation)  Hyperlipidemia  CHF (congestive heart failure)  Cirrhosis  Type 2 diabetes mellitus  Aortic stenosis  S/P CABG x 3  Atrial fibrillation with RVR  Hyperglycemia without ketosis    Time spent: 40 minutes   [facility]  [facility] Pager [phone]. If 8PM-8AM, please contact night-coverage at [email or web address], password [facility] 04/19/2012, 8:23 AM  LOS: 2 days

## 2012-04-19 NOTE — Progress Notes (Signed)
Cardiology Progress Note Patient Name: [name] Date of Encounter: 04/19/2012, 8:14 AM     Subjective  No overnight events. Patient feels well without complaints of chest pain or sob. Able to ambulate without dizziness.   Objective   Telemetry: Atrial fibrillation 60-80s  Medications: . aspirin EC  81 mg Oral Daily  . atorvastatin  20 mg Oral QHS  . digoxin  250 mcg Oral Daily  . ferrous gluconate  324 mg Oral TID WC  . furosemide  80 mg Oral BID  . glimepiride  4 mg Oral Q supper  . influenza  inactive virus vaccine  0.5 mL Intramuscular Tomorrow-1000  . insulin aspart  0-9 Units Subcutaneous TID WC  . insulin detemir  25 Units Subcutaneous Daily  . isosorbide mononitrate  30 mg Oral Daily  . lisinopril  10 mg Oral Daily  . metoprolol succinate  75 mg Oral BID  . multivitamin with minerals  1 tablet Oral Daily  . omega-3 acid ethyl esters  1 g Oral BID  . pantoprazole  40 mg Oral Q1200  . potassium chloride  30 mEq Oral BID  . sodium chloride  3 mL Intravenous Q12H  . verapamil  80 mg Oral Q8H  . DISCONTD: insulin glargine  25 Units Subcutaneous Daily  . DISCONTD: insulin glargine  5 Units Subcutaneous QHS      Physical Exam: Temp:  [97.6 F (36.4 C)-98 F (36.7 C)] 97.6 F (36.4 C) (09/26 0603) Pulse Rate:  [65-69] 65  (09/26 0603) Resp:  [16-19] 19  (09/26 0603) BP: (99-152)/(72-75) 120/75 mmHg (09/26 0603) SpO2:  [97 %-100 %] 98 % (09/26 0603) Weight:  [257 lb 15 oz (117 kg)] 257 lb 15 oz (117 kg) (09/26 0603)  General: Obese white male in no acute distress.  Head: Normocephalic, atraumatic, sclera non-icteric, nares are without discharge  Neck: Supple. Negative for carotid bruits. JVD not elevated.  Lungs: Clear bilaterally to auscultation without wheezes, rales, or rhonchi. Breathing is unlabored.  Heart: Irregularly irregular with S1 S2. 3/6 systolic crescendo decrescendo murmur. No rubs or gallops appreciated.  Abdomen: Obese, Soft, non-tender,  non-distended with normoactive bowel sounds. No rebound/guarding. No obvious abdominal masses.  Msk: Strength and tone appear normal for age.  Extremities: No edema. No clubbing or cyanosis. Chronic skin discoloration to bilat shins. Distal pedal pulses are intact and equal bilaterally.  Neuro: Alert and oriented X 3. Moves all extremities spontaneously.  Psych: Responds to questions appropriately with a normal affect    Intake/Output Summary (Last 24 hours) at 04/19/12 0814 Last data filed at 04/19/12 0102  Gross per 24 hour  Intake   1040 ml  Output   3750 ml  Net  -2710 ml    Labs:  [facility] 04/18/12 0625 04/17/12 1413  NA 135 132*  K 4.1 4.7  CL 94* 89*  CO2 26 29  GLUCOSE 303* 429*  BUN 19 20  CREATININE 0.81 0.96  CALCIUM 10.0 10.5   Basename 04/18/12 0625 04/16/12 1434  AST 49* 41*  ALT 49 50  ALKPHOS 77 90  BILITOT 0.5 0.4  PROT 7.1 7.9  ALBUMIN 3.8 4.2   Basename 04/18/12 0625 04/17/12 1413  WBC 5.1 5.7  NEUTROABS 3.3 3.9  HGB 12.5* 13.5  HCT 37.7* 41.2  MCV 85.9 86.7  PLT 122* 129*   Basename 04/18/12 0625  HGBA1C 8.9*   Basename 04/18/12 0625  TSH 2.922    Radiology/Studies:   04/17/2012 -  Chest 2 View  Findings: Status post median sternotomy and CABG. Heart size upper normal. No confluent airspace opacity. No pleural effusion or pneumothorax. No acute osseous finding. Multilevel degenerative change. IMPRESSION: No radiographic evidence of acute cardiopulmonary process.      Assessment and Plan  66 y.o. 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   1. Diabetes Mellitus, Type 2, Uncontrolled: A1C 9.2-->8.9. Patient admits to dietary indiscretions and medication noncompliance. Admitted with blood sugars >500. No DKA. Still with elevated CBGs. Lengthy discussion on importance of compliance with diabetic diet and meds. Adjustment of medication regimen per primary team.   2. Atrial Fibrillation: Elevated rates in the ED, now improved with change from Lopressor to Toprol XL. No anticoagulation 2/2 h/o recurrent GIB and cirrhosis.   3. Aortic Stenosis, Severe: AVR postponed until next week (if CBGs controlled) due to #1.   4. Chronic Diastolic CHF: Stable. No overt volume overload.   5. Coronary Artery Disease: Stable. Continue meds  Disposition: Patient would like to follow up with Dr. Excell Seltzer in [place] in the future.  Signed, HOPE, JESSICA PA-C

## 2012-04-20 DIAGNOSIS — R188 Other ascites: Secondary | ICD-10-CM

## 2012-04-20 DIAGNOSIS — D649 Anemia, unspecified: Secondary | ICD-10-CM

## 2012-04-20 LAB — GLUCOSE, CAPILLARY: Glucose-Capillary: 270 mg/dL — ABNORMAL HIGH (ref 70–99)

## 2012-04-20 MED ORDER — INSULIN DETEMIR 100 UNIT/ML ~~LOC~~ SOLN: 50.0000 [IU] | Freq: Every day | SUBCUTANEOUS | Status: DC

## 2012-04-20 MED ORDER — INSULIN GLARGINE 100 UNIT/ML ~~LOC~~ SOLN: 15.0000 [IU] | Freq: Every day | SUBCUTANEOUS | Status: DC

## 2012-04-20 NOTE — Discharge Summary (Addendum)
Physician Discharge Summary  [name] YQM:[id number] DOB: 20-Oct-1945 DOA: 04/17/2012  PCP: [name], MD  Admit date: 04/17/2012 Discharge date: 04/20/2012  Recommendations for Outpatient Follow-up:  1. Pt will need to follow up with PCP in 2-3 weeks post discharge 2. Please obtain BMP to evaluate electrolytes and kidney function 3. Please also check CBC to evaluate Hg and Hct levels 4. Please also note that pt's diabetic regimen was changes and he was discharged on Metformin, Levimir, Lantus 5. I have given the pt my number so that he can update me with sugar levels so that we can readjust the medication dose as indicated 6. He and his wife verbalized understanding 7. Over one hour spend on discussing diabetes regimen and medical management 8. I have discontinued Glimepiride due to high risk of hypoglycemic events noted while in hospital 9. Due to financial problems, Januvia and Tradjenda were discontinued as pt not able to afford the medications 10. Will forward this summary to PCP as well to make aware of the changes 11. I would continue increasing the dose of Lantus and Levimir as needed as pt shoed good response 12. Again, diet and exercise regimen discussed in great detail 13. 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 Principal Problem:  *DM2 (diabetes mellitus, type 2) Active Problems:  AVM (arteriovenous malformation)  Hyperlipidemia  CHF (congestive heart failure)  Cirrhosis  Type 2 diabetes mellitus  Aortic stenosis  S/P CABG x 3  Atrial fibrillation with RVR  Hyperglycemia without ketosis   Discharge Condition: Stable  Diet recommendation: Heart healthy diet discussed in details over 1 hour  History of present illness:  66 year-old male with history of severe aortic stenosis who was scheduled for surgery tomorrow had preop labs done  which showed elevated blood sugar was referred to the ER for further management. In the ER patient also was found to be in A. fib with RVR. Patient has a known history of atrial fibrillation not on Coumadin secondary to GI bleed and cirrhosis of liver. Cardiology has already seen the patient and at this time has recommended to increase Toprol dose to 75 mg q. twice a day. Cardiothoracic surgery at this time is postponing his surgery due to his uncontrolled diabetes. Patient but she was found to be high but not in DKA. Patient states over the last couple of days he had run out of his Jearld Lesch but has been taking his other medications regularly. Over the last one month his blood sugar has been running high. Patient was given NovoLog insulin 10 units subcutaneous and at this time his blood sugars come down to 300. Patient will be admitted for further management. Patient otherwise denies any chest pain shortness of breath nausea vomiting abdominal pain fever chills.   Hospital Course:  Principal Problem:  *DM2 (diabetes mellitus, type 2) - On Metformin, Levimir and Lantus pt has shown an improvement in CBG control but regimen needs to be continuously re evaluated and readjusted - this was discussed in detail with pt and wife - discontinued Glymepiride due to several hypoglycemic events  Active Problems:  AVM (arteriovenous malformation),  - pt is is not Coumadin candidate due to GI bleed   Hyperlipidemia - continue statin   CHF (congestive heart failure) - this has remained clinically stable - pt will continue home medication regimen   Aortic stenosis - surgery plan for  valve replacement as per cardiology recommendations   S/P CABG x 3   Atrial fibrillation with RVR - determined not to be Coumadin candidate due to GI bleed  Procedures/Studies:  Dg Chest 2 View 04/17/2012   IMPRESSION:  No radiographic evidence of acute cardiopulmonary process.     Dg Chest 2 View 04/16/2012       IMPRESSION:  No acute abnormality is seen.     Consultations:  Cardiology  Antibiotics:  None  Discharge Exam: Filed Vitals:   04/20/12 0654  BP: 131/73  Pulse: 72  Temp: 97.6 F (36.4 C)  Resp: 20   Filed Vitals:   04/19/12 0933 04/19/12 1235 04/19/12 1500 04/20/12 0654  BP: 131/70 122/64 95/54 131/73  Pulse: 73 80 78 72  Temp: 97.6 F (36.4 C)  97.1 F (36.2 C) 97.6 F (36.4 C)  TempSrc: Oral  Oral Oral  Resp: 17  16 20   Height:      Weight:    117.2 kg (258 lb 6.1 oz)  SpO2: 98%  96% 100%    General: Pt is alert, follows commands appropriately, not in acute distress Cardiovascular: Regular rate and rhythm, S1/S2 +, SEM, no rubs, no gallops Respiratory: Clear to auscultation bilaterally, no wheezing, no crackles, no rhonchi Abdominal: Soft, non tender, non distended, bowel sounds +, no guarding Extremities: no edema, no cyanosis, pulses palpable bilaterally DP and PT Neuro: Grossly nonfocal  Discharge Instructions  Discharge Orders    Future Appointments: Provider: Department: Dept Phone: Center:   04/23/2012 3:00 PM [facility] [phone] None   05/22/2012 8:00 AM Radene Gunning [facility] [phone] None   07/10/2012 11:00 AM Malissa Hippo, MD [facility] [phone] None   08/21/2012 8:45 AM Dava Najjar Idelle Jo [facility] [phone] None   08/21/2012 9:15 AM Myrtis Ser, NP [facility] [phone] None     Future Orders Please Complete By Expires   Diet - low sodium heart healthy      Increase activity slowly          Medication List     As of 04/20/2012 11:29 AM    STOP taking these medications         aspirin EC 81 MG tablet      glimepiride 4 MG tablet   Commonly known as: AMARYL      linagliptin 5 MG Tabs tablet   Commonly known as: TRADJENTA      sitaGLIPtin 100 MG tablet   Commonly known as: JANUVIA      TAKE these medications         atorvastatin 20 MG tablet    Commonly known as: LIPITOR   Take 20 mg by mouth at bedtime.      digoxin 0.25 MG tablet   Commonly known as: LANOXIN   Take 1 tablet (250 mcg total) by mouth daily.      ferrous gluconate 325 MG tablet   Commonly known as: FERGON   Take 325 mg by mouth 3 (three) times daily with meals.      Fish Oil 1200 MG Caps   Take 1,200 mg by mouth 2 (two) times daily.      furosemide 80 MG tablet   Commonly known as: LASIX   Take 80 mg by mouth 2 (two) times daily.      insulin detemir 100 UNIT/ML injection   Commonly known as: LEVEMIR   Inject 50 Units into the skin daily.  isosorbide mononitrate 30 MG 24 hr tablet   Commonly known as: IMDUR   Take 1 tablet (30 mg total) by mouth daily.      lisinopril 10 MG tablet   Commonly known as: PRINIVIL,ZESTRIL   Take 1 tablet (10 mg total) by mouth daily.      metFORMIN 1000 MG tablet   Commonly known as: GLUCOPHAGE   Take 1,000 mg by mouth 2 (two) times daily with a meal.      metoprolol 50 MG tablet   Commonly known as: LOPRESSOR   Take 75 mg by mouth 2 (two) times daily.      multivitamin tablet   Take 1 tablet by mouth daily.      nitroGLYCERIN 0.4 MG SL tablet   Commonly known as: NITROSTAT   Place 0.4 mg under the tongue every 5 (five) minutes x 3 doses as needed. For chest pain      omeprazole 20 MG capsule   Commonly known as: PRILOSEC   Take 40 mg by mouth daily.      potassium chloride 10 MEQ tablet   Commonly known as: K-DUR   Take 30 mEq by mouth 2 (two) times daily.      verapamil 80 MG tablet   Commonly known as: CALAN   Take 80 mg by mouth 3 (three) times daily.      vitamin C 1000 MG tablet   Take 1,000 mg by mouth 2 (two) times daily.           Follow-up Information    Follow up with [name], MD. In 3 weeks.   Contact information:   [address] [place] [address] [phone]           The results of significant diagnostics from this hospitalization (including  imaging, microbiology, ancillary and laboratory) are listed below for reference.     Microbiology: Recent Results (from the past 240 hour(s))  SURGICAL PCR SCREEN     Status: Normal   Collection Time   04/16/12  2:37 PM      Component Value Range Status Comment   MRSA, PCR NEGATIVE  NEGATIVE Final    Staphylococcus aureus NEGATIVE  NEGATIVE Final      Labs: Basic Metabolic Panel:  Lab 04/19/12 9147 04/18/12 0625 04/17/12 1413 04/16/12 1434  NA 133* 135 132* 132*  K 4.3 4.1 4.7 4.6  CL 91* 94* 89* 90*  CO2 26 26 29 25   GLUCOSE 367* 303* 429* 536*  BUN 21 19 20 19   CREATININE 0.87 0.81 0.96 0.91  CALCIUM 10.2 10.0 10.5 10.1  MG -- -- -- --  PHOS -- -- -- --   Liver Function Tests:  Lab 04/18/12 0625 04/16/12 1434  AST 49* 41*  ALT 49 50  ALKPHOS 77 90  BILITOT 0.5 0.4  PROT 7.1 7.9  ALBUMIN 3.8 4.2   CBC:  Lab 04/18/12 0625 04/17/12 1413 04/16/12 1434  WBC 5.1 5.7 5.8  NEUTROABS 3.3 3.9 --  HGB 12.5* 13.5 12.3*  HCT 37.7* 41.2 38.6*  MCV 85.9 86.7 87.3  PLT 122* 129* 140*   BNP: BNP (last 3 results)  Basename 03/02/12 1531 05/27/11 1952 04/29/11 1813  PROBNP 804.8* 920.8* 737.1*   CBG:  Lab 04/20/12 0619 04/19/12 2117 04/19/12 1603 04/19/12 1127 04/19/12 0555  GLUCAP 184* 243* 275* 323* 235*     SIGNED: Time coordinating discharge: Over 30 minutes  Debbora Presto, MD  [facility] 04/20/2012, 11:29  AM Pager [phone]  If 7PM-7AM, please contact night-coverage [email or web address] Password [email or web address]

## 2012-04-20 NOTE — Progress Notes (Signed)
Discharge instructions given to pt. Verbalized understanding.

## 2012-04-20 NOTE — Progress Notes (Signed)
CBG continue to improve. I will be away over the weekend. We are planning to do his AVR next Wednesday If he is discharged over the weekend my office will call him on Monday to make arrangements Thank you

## 2012-04-23 ENCOUNTER — Ambulatory Visit (HOSPITAL_COMMUNITY): Admission: RE | Admit: 2012-04-23 | Payer: Managed Care, Other (non HMO) | Source: Ambulatory Visit

## 2012-04-23 ENCOUNTER — Other Ambulatory Visit (HOSPITAL_COMMUNITY): Payer: Managed Care, Other (non HMO)

## 2012-04-23 ENCOUNTER — Other Ambulatory Visit: Payer: Self-pay | Admitting: *Deleted

## 2012-04-23 ENCOUNTER — Encounter (HOSPITAL_COMMUNITY)
Admission: RE | Admit: 2012-04-23 | Discharge: 2012-04-23 | Disposition: A | Discharge status: Home / Self Care | Payer: Managed Care, Other (non HMO) | Source: Ambulatory Visit | Attending: Thoracic Surgery (Cardiothoracic Vascular Surgery) | Admitting: Thoracic Surgery (Cardiothoracic Vascular Surgery)

## 2012-04-23 ENCOUNTER — Encounter (HOSPITAL_COMMUNITY): Payer: Self-pay

## 2012-04-23 VITALS — BP 119/63 | HR 122 | Temp 98.6°F | Resp 20 | Ht 68.0 in | Wt 260.8 lb

## 2012-04-23 DIAGNOSIS — I359 Nonrheumatic aortic valve disorder, unspecified: Secondary | ICD-10-CM

## 2012-04-23 LAB — TYPE AND SCREEN
ABO/RH(D): A POS
Antibody Screen: POSITIVE
Donor AG Type: NEGATIVE
Donor AG Type: NEGATIVE
Unit division: 0

## 2012-04-23 LAB — BLOOD GAS, ARTERIAL
Acid-Base Excess: 1.5 mmol/L (ref 0.0–2.0)
FIO2: 0.21 %
O2 Saturation: 98.3 %
Patient temperature: 98.6

## 2012-04-23 LAB — CBC
MCH: 27.6 pg (ref 26.0–34.0)
MCHC: 32.3 g/dL (ref 30.0–36.0)
MCV: 85.6 fL (ref 78.0–100.0)
Platelets: 118 10*3/uL — ABNORMAL LOW (ref 150–400)
RBC: 4.71 MIL/uL (ref 4.22–5.81)
RDW: 14.8 % (ref 11.5–15.5)

## 2012-04-23 LAB — COMPREHENSIVE METABOLIC PANEL
AST: 55 U/L — ABNORMAL HIGH (ref 0–37)
CO2: 19 mEq/L (ref 19–32)
Calcium: 9.8 mg/dL (ref 8.4–10.5)
Creatinine, Ser: 0.78 mg/dL (ref 0.50–1.35)
GFR calc non Af Amer: >90 mL/min (ref >90)
Total Protein: 7.8 g/dL (ref 6.0–8.3)

## 2012-04-23 LAB — PROTIME-INR
INR: 0.95 (ref 0.00–1.49)
Prothrombin Time: 12.6 seconds (ref 11.6–15.2)

## 2012-04-23 LAB — HEMOGLOBIN A1C: Mean Plasma Glucose: 203 mg/dL — ABNORMAL HIGH (ref <117)

## 2012-04-23 LAB — APTT: aPTT: 27 seconds (ref 24–37)

## 2012-04-23 MED ORDER — CHLORHEXIDINE GLUCONATE 4 % EX LIQD: 30.0000 mL | CUTANEOUS | Status: DC

## 2012-04-23 NOTE — Progress Notes (Addendum)
cxr 04/16/12, echo 8/13, cath 8/13, note dr cooper/9/27, pft's 03/09/12, dopplers 04/16/12 in epic Recently put new diabetic meds pill and insulin since 04/16/12. Due to elevated blood sugar on 04/16/12 ,was hospitalized

## 2012-04-23 NOTE — Pre-Procedure Instructions (Signed)
20 [name]  04/23/2012   Your procedure is scheduled on:  04/18/2012  [facility]  Report to [facility] at 0630 AM.  Call this number if you have problems the morning of surgery: [phone]   Remember:   Do not eat food NOR DRINK LIQUIDS:After Midnight.                 Take these medicines the morning of surgery with A SIP OF WATER: LANOXIN  IMDUR  METOPROLOL  NITROGLYCERIN IF NEEDED  PRILOSEC  VERAPIMIL   Do not wear jewelry, make-up or nail polish.  Do not wear lotions, powders, or perfumes.   Do not shave 48 hours prior to surgery. Men may shave face and neck.  Do not bring valuables to the hospital.  Contacts, dentures or bridgework may not be worn into surgery.  Leave suitcase in the car. After surgery it may be brought to your room.  For patients admitted to the hospital, checkout time is 11:00 AM the day of discharge.   Patients discharged the day of surgery will not be allowed to drive home.  Name and phone number of your driver:   Special Instructions: Shower using CHG 2 nights before surgery and the night before surgery.  If you shower the day of surgery use CHG.  Use special wash - you have one bottle of CHG for all showers.  You should use approximately 1/3 of the bottle for each shower.   Please read over the following fact sheets that you were given: Pain Booklet, Coughing and Deep Breathing, Blood Transfusion Information, Lab Information, Open Heart Packet, MRSA Information and Surgical Site Infection Prevention

## 2012-04-23 NOTE — Pre-Procedure Instructions (Addendum)
20 [name]  04/23/2012   Your procedure is scheduled on: 04/25/12 [facility]  Report to [facility] at 0630 AM.  Call this number if you have problems the morning of surgery: [phone]   Remember:   Do not eat food NOR DRINK LIQUIDS:After Midnight.                 Take these medicines the morning of surgery with A SIP OF WATER: LANOXIN  IMDUR  METOPROLOL  NITROGLYCERIN IF NEEDED  PRILOSEC  VERAPIMIL   Do not wear jewelry, make-up or nail polish.  Do not wear lotions, powders, or perfumes.   Do not shave 48 hours prior to surgery. Men may shave face and neck.  Do not bring valuables to the hospital.  Contacts, dentures or bridgework may not be worn into surgery.  Leave suitcase in the car. After surgery it may be brought to your room.  For patients admitted to the hospital, checkout time is 11:00 AM the day of discharge.   Patients discharged the day of surgery will not be allowed to drive home.  Name and phone number of your driver: wife [name] [phone]  Cell [phone]  Special Instructions: Shower using CHG 2 nights before surgery and the night before surgery.  If you shower the day of surgery use CHG.  Use special wash - you have one bottle of CHG for all showers.  You should use approximately 1/3 of the bottle for each shower.   Please read over the following fact sheets that you were given: Pain Booklet, Coughing and Deep Breathing, Blood Transfusion Information, Lab Information, Open Heart Packet, MRSA Information and Surgical Site Infection Prevention

## 2012-04-24 MED ORDER — POTASSIUM CHLORIDE 2 MEQ/ML IV SOLN
80.0000 meq | INTRAVENOUS | Status: DC
  Filled 2012-04-24: qty 40

## 2012-04-24 MED ORDER — PLASMA-LYTE 148 IV SOLN
INTRAVENOUS | Status: AC
  Administered 2012-04-25: 11:00:00
  Filled 2012-04-24: qty 2.5

## 2012-04-24 MED ORDER — MAGNESIUM SULFATE 50 % IJ SOLN
40.0000 meq | INTRAMUSCULAR | Status: DC
  Filled 2012-04-24: qty 10

## 2012-04-24 MED ORDER — SODIUM BICARBONATE 8.4 % IV SOLN
INTRAVENOUS | Status: DC
  Filled 2012-04-24: qty 2.5

## 2012-04-24 MED ORDER — DEXMEDETOMIDINE HCL IN NACL 400 MCG/100ML IV SOLN
0.1000 ug/kg/h | INTRAVENOUS | Status: AC
  Administered 2012-04-25: .2 ug/kg/h via INTRAVENOUS
  Filled 2012-04-24: qty 100

## 2012-04-24 MED ORDER — NITROGLYCERIN IN D5W 200-5 MCG/ML-% IV SOLN
2.0000 ug/min | INTRAVENOUS | Status: DC
  Filled 2012-04-24: qty 250

## 2012-04-24 MED ORDER — EPINEPHRINE HCL 1 MG/ML IJ SOLN
0.5000 ug/min | INTRAVENOUS | Status: DC
  Filled 2012-04-24: qty 4

## 2012-04-24 MED ORDER — DOPAMINE-DEXTROSE 3.2-5 MG/ML-% IV SOLN
2.0000 ug/kg/min | INTRAVENOUS | Status: DC
  Filled 2012-04-24: qty 250

## 2012-04-24 MED ORDER — CEFUROXIME SODIUM 750 MG IJ SOLR
750.0000 mg | INTRAMUSCULAR | Status: DC
  Filled 2012-04-24: qty 750

## 2012-04-24 MED ORDER — VANCOMYCIN HCL 1000 MG IV SOLR
1250.0000 mg | INTRAVENOUS | Status: DC
  Filled 2012-04-24: qty 1250

## 2012-04-24 MED ORDER — DEXTROSE 5 % IV SOLN
1.5000 g | INTRAVENOUS | Status: AC
  Administered 2012-04-25: 750 g via INTRAVENOUS
  Administered 2012-04-25: 1.5 g via INTRAVENOUS
  Filled 2012-04-24: qty 1.5

## 2012-04-24 MED ORDER — VANCOMYCIN HCL 1000 MG IV SOLR
1500.0000 mg | INTRAVENOUS | Status: AC
  Administered 2012-04-25: 1500 mg via INTRAVENOUS
  Filled 2012-04-24: qty 1500

## 2012-04-24 MED ORDER — SODIUM CHLORIDE 0.9 % IV SOLN
INTRAVENOUS | Status: AC
  Administered 2012-04-25: 64.8 mL/h via INTRAVENOUS
  Filled 2012-04-24: qty 40

## 2012-04-24 MED ORDER — SODIUM CHLORIDE 0.9 % IV SOLN
INTRAVENOUS | Status: AC
  Administered 2012-04-25: 1 [IU]/h via INTRAVENOUS
  Filled 2012-04-24: qty 1

## 2012-04-24 MED ORDER — PHENYLEPHRINE HCL 10 MG/ML IJ SOLN
30.0000 ug/min | INTRAVENOUS | Status: AC
  Administered 2012-04-25: 100 ug/min via INTRAVENOUS
  Filled 2012-04-24: qty 2

## 2012-04-25 ENCOUNTER — Encounter (HOSPITAL_COMMUNITY): Payer: Self-pay | Admitting: Surgery

## 2012-04-25 ENCOUNTER — Inpatient Hospital Stay (HOSPITAL_COMMUNITY)
Admission: RE | Admit: 2012-04-25 | Discharge: 2012-05-01 | DRG: 219 | Disposition: A | Discharge status: Home / Self Care | Payer: Managed Care, Other (non HMO) | Source: Ambulatory Visit | Attending: Thoracic Surgery (Cardiothoracic Vascular Surgery) | Admitting: Thoracic Surgery (Cardiothoracic Vascular Surgery)

## 2012-04-25 ENCOUNTER — Inpatient Hospital Stay (HOSPITAL_COMMUNITY): Payer: Managed Care, Other (non HMO)

## 2012-04-25 ENCOUNTER — Ambulatory Visit (HOSPITAL_COMMUNITY): Payer: Managed Care, Other (non HMO) | Admitting: Anesthesiology

## 2012-04-25 ENCOUNTER — Encounter (HOSPITAL_COMMUNITY): Payer: Self-pay | Admitting: Anesthesiology

## 2012-04-25 ENCOUNTER — Encounter (HOSPITAL_COMMUNITY)
Admission: RE | Disposition: A | Discharge status: Home / Self Care | Payer: Self-pay | Source: Ambulatory Visit | Attending: Thoracic Surgery (Cardiothoracic Vascular Surgery)

## 2012-04-25 DIAGNOSIS — E119 Type 2 diabetes mellitus without complications: Secondary | ICD-10-CM | POA: Diagnosis present

## 2012-04-25 DIAGNOSIS — K7460 Unspecified cirrhosis of liver: Secondary | ICD-10-CM | POA: Diagnosis present

## 2012-04-25 DIAGNOSIS — K5520 Angiodysplasia of colon without hemorrhage: Secondary | ICD-10-CM | POA: Diagnosis present

## 2012-04-25 DIAGNOSIS — D62 Acute posthemorrhagic anemia: Secondary | ICD-10-CM | POA: Diagnosis not present

## 2012-04-25 DIAGNOSIS — D696 Thrombocytopenia, unspecified: Secondary | ICD-10-CM | POA: Diagnosis present

## 2012-04-25 DIAGNOSIS — Z6839 Body mass index (BMI) 39.0-39.9, adult: Secondary | ICD-10-CM

## 2012-04-25 DIAGNOSIS — I359 Nonrheumatic aortic valve disorder, unspecified: Secondary | ICD-10-CM

## 2012-04-25 DIAGNOSIS — Z79899 Other long term (current) drug therapy: Secondary | ICD-10-CM

## 2012-04-25 DIAGNOSIS — K449 Diaphragmatic hernia without obstruction or gangrene: Secondary | ICD-10-CM | POA: Diagnosis present

## 2012-04-25 DIAGNOSIS — M129 Arthropathy, unspecified: Secondary | ICD-10-CM | POA: Diagnosis present

## 2012-04-25 DIAGNOSIS — I2510 Atherosclerotic heart disease of native coronary artery without angina pectoris: Secondary | ICD-10-CM | POA: Diagnosis present

## 2012-04-25 DIAGNOSIS — Z951 Presence of aortocoronary bypass graft: Secondary | ICD-10-CM

## 2012-04-25 DIAGNOSIS — J9819 Other pulmonary collapse: Secondary | ICD-10-CM | POA: Diagnosis not present

## 2012-04-25 DIAGNOSIS — I4901 Ventricular fibrillation: Secondary | ICD-10-CM | POA: Diagnosis not present

## 2012-04-25 DIAGNOSIS — J449 Chronic obstructive pulmonary disease, unspecified: Secondary | ICD-10-CM | POA: Diagnosis present

## 2012-04-25 DIAGNOSIS — I5032 Chronic diastolic (congestive) heart failure: Secondary | ICD-10-CM | POA: Diagnosis present

## 2012-04-25 DIAGNOSIS — G4733 Obstructive sleep apnea (adult) (pediatric): Secondary | ICD-10-CM | POA: Diagnosis present

## 2012-04-25 DIAGNOSIS — I4891 Unspecified atrial fibrillation: Secondary | ICD-10-CM | POA: Diagnosis present

## 2012-04-25 DIAGNOSIS — Z01818 Encounter for other preprocedural examination: Secondary | ICD-10-CM

## 2012-04-25 DIAGNOSIS — I509 Heart failure, unspecified: Secondary | ICD-10-CM | POA: Diagnosis present

## 2012-04-25 DIAGNOSIS — I442 Atrioventricular block, complete: Secondary | ICD-10-CM | POA: Diagnosis not present

## 2012-04-25 DIAGNOSIS — Z01810 Encounter for preprocedural cardiovascular examination: Secondary | ICD-10-CM

## 2012-04-25 DIAGNOSIS — K219 Gastro-esophageal reflux disease without esophagitis: Secondary | ICD-10-CM | POA: Diagnosis present

## 2012-04-25 DIAGNOSIS — I10 Essential (primary) hypertension: Secondary | ICD-10-CM | POA: Diagnosis present

## 2012-04-25 DIAGNOSIS — Z952 Presence of prosthetic heart valve: Secondary | ICD-10-CM

## 2012-04-25 DIAGNOSIS — Z01812 Encounter for preprocedural laboratory examination: Secondary | ICD-10-CM

## 2012-04-25 DIAGNOSIS — J4489 Other specified chronic obstructive pulmonary disease: Secondary | ICD-10-CM | POA: Diagnosis present

## 2012-04-25 HISTORY — PX: AORTIC VALVE REPLACEMENT: SHX41

## 2012-04-25 LAB — CBC
MCH: 27.9 pg (ref 26.0–34.0)
MCH: 27.8 pg (ref 26.0–34.0)
MCHC: 32.8 g/dL (ref 30.0–36.0)
Platelets: 73 10*3/uL — ABNORMAL LOW (ref 150–400)
Platelets: 83 10*3/uL — ABNORMAL LOW (ref 150–400)
RBC: 3.65 MIL/uL — ABNORMAL LOW (ref 4.22–5.81)
RBC: 3.88 MIL/uL — ABNORMAL LOW (ref 4.22–5.81)
RDW: 14.5 % (ref 11.5–15.5)

## 2012-04-25 LAB — POCT I-STAT 4, (NA,K, GLUC, HGB,HCT)
Glucose, Bld: 194 mg/dL — ABNORMAL HIGH (ref 70–99)
Glucose, Bld: 123 mg/dL — ABNORMAL HIGH (ref 70–99)
Glucose, Bld: 137 mg/dL — ABNORMAL HIGH (ref 70–99)
Glucose, Bld: 139 mg/dL — ABNORMAL HIGH (ref 70–99)
Glucose, Bld: 165 mg/dL — ABNORMAL HIGH (ref 70–99)
HCT: 34 % — ABNORMAL LOW (ref 39.0–52.0)
HCT: 31 % — ABNORMAL LOW (ref 39.0–52.0)
HCT: 28 % — ABNORMAL LOW (ref 39.0–52.0)
Hemoglobin: 11.9 g/dL — ABNORMAL LOW (ref 13.0–17.0)
Hemoglobin: 9.2 g/dL — ABNORMAL LOW (ref 13.0–17.0)
Hemoglobin: 10.5 g/dL — ABNORMAL LOW (ref 13.0–17.0)
Hemoglobin: 9.9 g/dL — ABNORMAL LOW (ref 13.0–17.0)
Potassium: 4 mEq/L (ref 3.5–5.1)
Potassium: 5.2 mEq/L — ABNORMAL HIGH (ref 3.5–5.1)
Sodium: 138 mEq/L (ref 135–145)
Sodium: 137 mEq/L (ref 135–145)
Sodium: 139 mEq/L (ref 135–145)

## 2012-04-25 LAB — GLUCOSE, CAPILLARY
Glucose-Capillary: 105 mg/dL — ABNORMAL HIGH (ref 70–99)
Glucose-Capillary: 109 mg/dL — ABNORMAL HIGH (ref 70–99)

## 2012-04-25 LAB — POCT I-STAT, CHEM 8
BUN: 17 mg/dL (ref 6–23)
Calcium, Ion: 1.17 mmol/L (ref 1.13–1.30)
Creatinine, Ser: 0.9 mg/dL (ref 0.50–1.35)
Hemoglobin: 10.5 g/dL — ABNORMAL LOW (ref 13.0–17.0)
TCO2: 22 mmol/L (ref 0–100)

## 2012-04-25 LAB — MAGNESIUM: Magnesium: 3.2 mg/dL — ABNORMAL HIGH (ref 1.5–2.5)

## 2012-04-25 LAB — POCT I-STAT 3, ART BLOOD GAS (G3+)
Acid-base deficit: 1 mmol/L (ref 0.0–2.0)
Acid-base deficit: 1 mmol/L (ref 0.0–2.0)
Acid-base deficit: 2 mmol/L (ref 0.0–2.0)
Bicarbonate: 24 mEq/L (ref 20.0–24.0)
Bicarbonate: 24.6 mEq/L — ABNORMAL HIGH (ref 20.0–24.0)
O2 Saturation: 98 %
Patient temperature: 36.9
Patient temperature: 37.2
TCO2: 27 mmol/L (ref 0–100)
TCO2: 25 mmol/L (ref 0–100)
TCO2: 25 mmol/L (ref 0–100)
pCO2 arterial: 40.6 mmHg (ref 35.0–45.0)
pCO2 arterial: 39.3 mmHg (ref 35.0–45.0)
pH, Arterial: 7.33 — ABNORMAL LOW (ref 7.350–7.450)
pH, Arterial: 7.379 (ref 7.350–7.450)
pH, Arterial: 7.386 (ref 7.350–7.450)
pH, Arterial: 7.376 (ref 7.350–7.450)
pO2, Arterial: 112 mmHg — ABNORMAL HIGH (ref 80.0–100.0)

## 2012-04-25 LAB — PROTIME-INR: Prothrombin Time: 16.1 seconds — ABNORMAL HIGH (ref 11.6–15.2)

## 2012-04-25 LAB — HEMOGLOBIN AND HEMATOCRIT, BLOOD
HCT: 30 % — ABNORMAL LOW (ref 39.0–52.0)
Hemoglobin: 9.9 g/dL — ABNORMAL LOW (ref 13.0–17.0)

## 2012-04-25 LAB — TYPE AND SCREEN: Antibody Screen: POSITIVE

## 2012-04-25 LAB — CREATININE, SERUM
Creatinine, Ser: 0.78 mg/dL (ref 0.50–1.35)
GFR calc non Af Amer: >90 mL/min (ref >90)

## 2012-04-25 SURGERY — REPLACEMENT, AORTIC VALVE, OPEN
Anesthesia: General | Site: Chest | Wound class: Clean

## 2012-04-25 MED ORDER — HEPARIN SODIUM (PORCINE) 1000 UNIT/ML IJ SOLN
INTRAMUSCULAR | Status: DC | PRN
  Administered 2012-04-25: 28000 [IU] via INTRAVENOUS

## 2012-04-25 MED ORDER — ACETAMINOPHEN 500 MG PO TABS
1000.0000 mg | ORAL_TABLET | Freq: Four times a day (QID) | ORAL | Status: AC
  Administered 2012-04-26 – 2012-04-30 (×15): 1000 mg via ORAL
  Filled 2012-04-25 (×21): qty 2

## 2012-04-25 MED ORDER — DEXTROSE 5 % IV SOLN
1.5000 g | Freq: Two times a day (BID) | INTRAVENOUS | Status: AC
  Administered 2012-04-25 – 2012-04-27 (×5): 1.5 g via INTRAVENOUS
  Filled 2012-04-25 (×5): qty 1.5

## 2012-04-25 MED ORDER — MIDAZOLAM HCL 5 MG/5ML IJ SOLN
INTRAMUSCULAR | Status: DC | PRN
  Administered 2012-04-25 (×2): 3 mg via INTRAVENOUS
  Administered 2012-04-25: 4 mg via INTRAVENOUS
  Administered 2012-04-25: 3 mg via INTRAVENOUS
  Administered 2012-04-25: 1 mg via INTRAVENOUS

## 2012-04-25 MED ORDER — OXYCODONE HCL 5 MG PO TABS
5.0000 mg | ORAL_TABLET | ORAL | Status: DC | PRN
  Administered 2012-04-26 (×3): 5 mg via ORAL
  Administered 2012-04-26: 10 mg via ORAL
  Administered 2012-04-26: 5 mg via ORAL
  Administered 2012-04-26 – 2012-05-01 (×14): 10 mg via ORAL
  Filled 2012-04-25 (×7): qty 2
  Filled 2012-04-25: qty 1
  Filled 2012-04-25 (×7): qty 2
  Filled 2012-04-25: qty 1
  Filled 2012-04-25: qty 2
  Filled 2012-04-25 (×2): qty 1

## 2012-04-25 MED ORDER — PHENYLEPHRINE HCL 10 MG/ML IJ SOLN: 30.0000 ug/min | INTRAVENOUS | Status: DC

## 2012-04-25 MED ORDER — SODIUM CHLORIDE 0.9 % IV SOLN
0.5000 g/h | Freq: Once | INTRAVENOUS | Status: DC
  Filled 2012-04-25: qty 20

## 2012-04-25 MED ORDER — POTASSIUM CHLORIDE 10 MEQ/50ML IV SOLN
10.0000 meq | INTRAVENOUS | Status: AC
  Administered 2012-04-25 (×3): 10 meq via INTRAVENOUS

## 2012-04-25 MED ORDER — AMINOCAPROIC ACID 250 MG/ML IV SOLN: INTRAVENOUS | Status: DC

## 2012-04-25 MED ORDER — METOPROLOL TARTRATE 25 MG/10 ML ORAL SUSPENSION
12.5000 mg | Freq: Two times a day (BID) | ORAL | Status: DC
  Filled 2012-04-25 (×3): qty 5

## 2012-04-25 MED ORDER — SODIUM CHLORIDE 0.9 % IV SOLN: INTRAVENOUS | Status: DC

## 2012-04-25 MED ORDER — SODIUM CHLORIDE 0.9 % IJ SOLN
3.0000 mL | Freq: Two times a day (BID) | INTRAMUSCULAR | Status: DC
  Administered 2012-04-26 (×2): 3 mL via INTRAVENOUS

## 2012-04-25 MED ORDER — PROPOFOL 10 MG/ML IV BOLUS
INTRAVENOUS | Status: DC | PRN
  Administered 2012-04-25: 150 mg via INTRAVENOUS

## 2012-04-25 MED ORDER — ALBUMIN HUMAN 5 % IV SOLN
INTRAVENOUS | Status: DC | PRN
  Administered 2012-04-25: 13:00:00 via INTRAVENOUS

## 2012-04-25 MED ORDER — MORPHINE SULFATE 2 MG/ML IJ SOLN
2.0000 mg | INTRAMUSCULAR | Status: DC | PRN
  Administered 2012-04-25 – 2012-04-26 (×4): 2 mg via INTRAVENOUS
  Administered 2012-04-26: 4 mg via INTRAVENOUS
  Administered 2012-04-26 (×5): 2 mg via INTRAVENOUS
  Filled 2012-04-25 (×7): qty 1
  Filled 2012-04-25: qty 2
  Filled 2012-04-25: qty 1

## 2012-04-25 MED ORDER — METOPROLOL TARTRATE 12.5 MG HALF TABLET: 12.5000 mg | ORAL_TABLET | Freq: Once | ORAL | Status: DC

## 2012-04-25 MED ORDER — EPINEPHRINE HCL 1 MG/ML IJ SOLN: 0.5000 ug/min | INTRAVENOUS | Status: DC

## 2012-04-25 MED ORDER — DIGOXIN 250 MCG PO TABS
250.0000 ug | ORAL_TABLET | Freq: Every day | ORAL | Status: DC
  Administered 2012-04-27 – 2012-05-01 (×5): 250 ug via ORAL
  Filled 2012-04-25 (×6): qty 1

## 2012-04-25 MED ORDER — LACTATED RINGERS IV SOLN
INTRAVENOUS | Status: DC | PRN
  Administered 2012-04-25: 08:00:00 via INTRAVENOUS

## 2012-04-25 MED ORDER — DOCUSATE SODIUM 100 MG PO CAPS
200.0000 mg | ORAL_CAPSULE | Freq: Every day | ORAL | Status: DC
  Administered 2012-04-26 – 2012-05-01 (×6): 200 mg via ORAL
  Filled 2012-04-25 (×5): qty 2

## 2012-04-25 MED ORDER — LACTATED RINGERS IV SOLN
INTRAVENOUS | Status: DC | PRN
  Administered 2012-04-25 (×2): via INTRAVENOUS

## 2012-04-25 MED ORDER — METOPROLOL TARTRATE 1 MG/ML IV SOLN: 2.5000 mg | INTRAVENOUS | Status: DC | PRN

## 2012-04-25 MED ORDER — LACTATED RINGERS IV SOLN
INTRAVENOUS | Status: DC | PRN
  Administered 2012-04-25 (×2): via INTRAVENOUS

## 2012-04-25 MED ORDER — SODIUM CHLORIDE 0.9 % IV SOLN
INTRAVENOUS | Status: DC
  Administered 2012-04-26: 2.4 [IU]/h via INTRAVENOUS
  Filled 2012-04-25 (×2): qty 1

## 2012-04-25 MED ORDER — ASPIRIN EC 325 MG PO TBEC
325.0000 mg | DELAYED_RELEASE_TABLET | Freq: Every day | ORAL | Status: DC
  Administered 2012-04-26: 325 mg via ORAL
  Filled 2012-04-25 (×2): qty 1

## 2012-04-25 MED ORDER — MAGNESIUM SULFATE 50 % IJ SOLN: 40.0000 meq | INTRAMUSCULAR | Status: DC

## 2012-04-25 MED ORDER — 0.9 % SODIUM CHLORIDE (POUR BTL) OPTIME
TOPICAL | Status: DC | PRN
  Administered 2012-04-25: 1000 mL
  Administered 2012-04-25: 6000 mL

## 2012-04-25 MED ORDER — LACTATED RINGERS IV SOLN
INTRAVENOUS | Status: DC
  Administered 2012-04-25: 20 mL/h via INTRAVENOUS

## 2012-04-25 MED ORDER — INSULIN REGULAR BOLUS VIA INFUSION
0.0000 [IU] | Freq: Three times a day (TID) | INTRAVENOUS | Status: DC
  Filled 2012-04-25: qty 10

## 2012-04-25 MED ORDER — ALBUMIN HUMAN 5 % IV SOLN
250.0000 mL | INTRAVENOUS | Status: AC | PRN
  Administered 2012-04-25: 250 mL via INTRAVENOUS

## 2012-04-25 MED ORDER — GELATIN ABSORBABLE MT POWD
OROMUCOSAL | Status: DC | PRN
  Administered 2012-04-25 (×4): via TOPICAL

## 2012-04-25 MED ORDER — ROCURONIUM BROMIDE 100 MG/10ML IV SOLN
INTRAVENOUS | Status: DC | PRN
  Administered 2012-04-25: 50 mg via INTRAVENOUS
  Administered 2012-04-25: 100 mg via INTRAVENOUS
  Administered 2012-04-25: 50 mg via INTRAVENOUS

## 2012-04-25 MED ORDER — ACETAMINOPHEN 650 MG RE SUPP
650.0000 mg | RECTAL | Status: AC
  Administered 2012-04-25: 650 mg via RECTAL

## 2012-04-25 MED ORDER — HEMOSTATIC AGENTS (NO CHARGE) OPTIME
TOPICAL | Status: DC | PRN
  Administered 2012-04-25: 1 via TOPICAL

## 2012-04-25 MED ORDER — ACETAMINOPHEN 160 MG/5ML PO SOLN: 650.0000 mg | ORAL | Status: AC

## 2012-04-25 MED ORDER — LACTATED RINGERS IV SOLN: 500.0000 mL | Freq: Once | INTRAVENOUS | Status: AC | PRN

## 2012-04-25 MED ORDER — PANTOPRAZOLE SODIUM 40 MG PO TBEC
40.0000 mg | DELAYED_RELEASE_TABLET | Freq: Every day | ORAL | Status: DC
  Administered 2012-04-27 – 2012-05-01 (×5): 40 mg via ORAL
  Filled 2012-04-25 (×5): qty 1

## 2012-04-25 MED ORDER — BISACODYL 10 MG RE SUPP: 10.0000 mg | Freq: Every day | RECTAL | Status: DC

## 2012-04-25 MED ORDER — DEXMEDETOMIDINE HCL IN NACL 400 MCG/100ML IV SOLN: 0.1000 ug/kg/h | INTRAVENOUS | Status: AC

## 2012-04-25 MED ORDER — POTASSIUM CHLORIDE 2 MEQ/ML IV SOLN: 80.0000 meq | INTRAVENOUS | Status: DC

## 2012-04-25 MED ORDER — ATORVASTATIN CALCIUM 20 MG PO TABS
20.0000 mg | ORAL_TABLET | Freq: Every day | ORAL | Status: DC
  Administered 2012-04-26 – 2012-04-30 (×5): 20 mg via ORAL
  Filled 2012-04-25 (×7): qty 1

## 2012-04-25 MED ORDER — ASPIRIN 81 MG PO CHEW: 324.0000 mg | CHEWABLE_TABLET | Freq: Every day | ORAL | Status: DC

## 2012-04-25 MED ORDER — NITROGLYCERIN IN D5W 200-5 MCG/ML-% IV SOLN: 0.0000 ug/min | INTRAVENOUS | Status: DC

## 2012-04-25 MED ORDER — VANCOMYCIN HCL IN DEXTROSE 1-5 GM/200ML-% IV SOLN
1000.0000 mg | Freq: Once | INTRAVENOUS | Status: AC
  Administered 2012-04-25: 1000 mg via INTRAVENOUS
  Filled 2012-04-25 (×2): qty 200

## 2012-04-25 MED ORDER — NITROGLYCERIN IN D5W 200-5 MCG/ML-% IV SOLN: 2.0000 ug/min | INTRAVENOUS | Status: DC

## 2012-04-25 MED ORDER — PROTAMINE SULFATE 10 MG/ML IV SOLN
INTRAVENOUS | Status: DC | PRN
  Administered 2012-04-25: 110 mg via INTRAVENOUS
  Administered 2012-04-25: 80 mg via INTRAVENOUS
  Administered 2012-04-25: 60 mg via INTRAVENOUS
  Administered 2012-04-25: 30 mg via INTRAVENOUS

## 2012-04-25 MED ORDER — SODIUM CHLORIDE 0.9 % IV SOLN
INTRAVENOUS | Status: DC
  Administered 2012-04-25 – 2012-04-26 (×2): 20 mL/h via INTRAVENOUS

## 2012-04-25 MED ORDER — DOPAMINE-DEXTROSE 3.2-5 MG/ML-% IV SOLN: 2.0000 ug/kg/min | INTRAVENOUS | Status: DC

## 2012-04-25 MED ORDER — MORPHINE SULFATE 2 MG/ML IJ SOLN: 1.0000 mg | INTRAMUSCULAR | Status: AC | PRN

## 2012-04-25 MED ORDER — ACETAMINOPHEN 160 MG/5ML PO SOLN
975.0000 mg | Freq: Four times a day (QID) | ORAL | Status: AC
  Filled 2012-04-25: qty 40.6

## 2012-04-25 MED ORDER — SODIUM CHLORIDE 0.9 % IJ SOLN: 3.0000 mL | INTRAMUSCULAR | Status: DC | PRN

## 2012-04-25 MED ORDER — SODIUM CHLORIDE 0.45 % IV SOLN
INTRAVENOUS | Status: DC
  Administered 2012-04-25: 20 mL/h via INTRAVENOUS

## 2012-04-25 MED ORDER — MAGNESIUM SULFATE 40 MG/ML IJ SOLN
4.0000 g | Freq: Once | INTRAMUSCULAR | Status: AC
  Administered 2012-04-25: 4 g via INTRAVENOUS
  Filled 2012-04-25: qty 100

## 2012-04-25 MED ORDER — METOPROLOL TARTRATE 12.5 MG HALF TABLET
12.5000 mg | ORAL_TABLET | Freq: Two times a day (BID) | ORAL | Status: DC
  Filled 2012-04-25 (×3): qty 1

## 2012-04-25 MED ORDER — MIDAZOLAM HCL 2 MG/2ML IJ SOLN: 2.0000 mg | INTRAMUSCULAR | Status: DC | PRN

## 2012-04-25 MED ORDER — POTASSIUM CHLORIDE 10 MEQ/50ML IV SOLN
10.0000 meq | INTRAVENOUS | Status: DC
  Administered 2012-04-25: 10 meq via INTRAVENOUS

## 2012-04-25 MED ORDER — FAMOTIDINE IN NACL 20-0.9 MG/50ML-% IV SOLN
20.0000 mg | Freq: Two times a day (BID) | INTRAVENOUS | Status: AC
  Administered 2012-04-25: 20 mg via INTRAVENOUS

## 2012-04-25 MED ORDER — FENTANYL CITRATE 0.05 MG/ML IJ SOLN
INTRAMUSCULAR | Status: DC | PRN
  Administered 2012-04-25: 100 ug via INTRAVENOUS
  Administered 2012-04-25: 50 ug via INTRAVENOUS
  Administered 2012-04-25: 150 ug via INTRAVENOUS
  Administered 2012-04-25: 250 ug via INTRAVENOUS
  Administered 2012-04-25: 200 ug via INTRAVENOUS
  Administered 2012-04-25 (×2): 150 ug via INTRAVENOUS
  Administered 2012-04-25: 50 ug via INTRAVENOUS
  Administered 2012-04-25: 400 ug via INTRAVENOUS

## 2012-04-25 MED ORDER — DEXMEDETOMIDINE HCL IN NACL 200 MCG/50ML IV SOLN
0.1000 ug/kg/h | INTRAVENOUS | Status: DC
  Administered 2012-04-25: 0.1 ug/kg/h via INTRAVENOUS
  Administered 2012-04-25: 0.6 ug/kg/h via INTRAVENOUS
  Filled 2012-04-25 (×2): qty 50

## 2012-04-25 MED ORDER — BISACODYL 5 MG PO TBEC
10.0000 mg | DELAYED_RELEASE_TABLET | Freq: Every day | ORAL | Status: DC
  Administered 2012-04-26 – 2012-05-01 (×5): 10 mg via ORAL
  Filled 2012-04-25 (×4): qty 2

## 2012-04-25 MED ORDER — PHENYLEPHRINE HCL 10 MG/ML IJ SOLN
0.0000 ug/min | INTRAVENOUS | Status: DC
  Filled 2012-04-25: qty 2

## 2012-04-25 MED ORDER — LIDOCAINE HCL (CARDIAC) 20 MG/ML IV SOLN
INTRAVENOUS | Status: DC | PRN
  Administered 2012-04-25: 100 mg via INTRAVENOUS

## 2012-04-25 MED ORDER — SODIUM CHLORIDE 0.9 % IV SOLN: 250.0000 mL | INTRAVENOUS | Status: DC

## 2012-04-25 MED ORDER — ONDANSETRON HCL 4 MG/2ML IJ SOLN
4.0000 mg | Freq: Four times a day (QID) | INTRAMUSCULAR | Status: DC | PRN
  Administered 2012-04-26: 4 mg via INTRAVENOUS
  Filled 2012-04-25: qty 2

## 2012-04-25 MED ORDER — VECURONIUM BROMIDE 10 MG IV SOLR
INTRAVENOUS | Status: DC | PRN
  Administered 2012-04-25 (×2): 5 mg via INTRAVENOUS

## 2012-04-25 SURGICAL SUPPLY — 97 items
ADAPTER CARDIO PERF ANTE/RETRO (ADAPTER) ×2 IMPLANT
ADAPTER MULTI PERFUSION 15 (ADAPTER) ×1 IMPLANT
ADH SRG 12 PREFL SYR 3 SPRDR (MISCELLANEOUS)
ADPR PRFSN 84XANTGRD RTRGD (ADAPTER) ×1
APPLICATOR COTTON TIP 6IN STRL (MISCELLANEOUS) IMPLANT
ATTRACTOMAT 16X20 MAGNETIC DRP (DRAPES) ×2 IMPLANT
BAG DECANTER FOR FLEXI CONT (MISCELLANEOUS) ×2 IMPLANT
BLADE MICRO 25.3X4.1 (BLADE) ×2 IMPLANT
BLADE STERNUM SYSTEM 6 (BLADE) ×1 IMPLANT
BLADE SURG 15 STRL LF DISP TIS (BLADE) ×1 IMPLANT
BLADE SURG 15 STRL SS (BLADE) ×2
CANISTER SUCTION 2500CC (MISCELLANEOUS) ×2 IMPLANT
CANNULA GUNDRY RCSP 15FR (MISCELLANEOUS) ×2 IMPLANT
CANNULA RT ANGLE VENOUS 36FR (CANNULA) IMPLANT
CANNULA SOFTFLOW AORTIC 8M24FR (CANNULA) ×1 IMPLANT
CANNULA VRC MALB SNGL STG 28FR (MISCELLANEOUS) IMPLANT
CANNULAE RT ANGLE VENOUS 36FR (CANNULA) ×2
CARDIOBLATE CARDIAC ABLATION (MISCELLANEOUS)
CATH CPB KIT HENDRICKSON (MISCELLANEOUS) ×1 IMPLANT
CATH HEART VENT LEFT (CATHETERS) ×1 IMPLANT
CATH ROBINSON RED A/P 18FR (CATHETERS) ×4 IMPLANT
CATH THORACIC 36FR (CATHETERS) ×1 IMPLANT
CATH THORACIC 36FR RT ANG (CATHETERS) ×3 IMPLANT
CATH/SQUID NICHOLS JEHLE COR (CATHETERS) ×1 IMPLANT
CLIP FOGARTY SPRING 6M (CLIP) IMPLANT
CLOTH BEACON ORANGE TIMEOUT ST (SAFETY) ×2 IMPLANT
CONN 1/2X1/2X1/2  BEN (MISCELLANEOUS) ×1
CONN 1/2X1/2X1/2 BEN (MISCELLANEOUS) IMPLANT
CONN 1/4X1/4 STERILE (MISCELLANEOUS) ×1 IMPLANT
COVER SURGICAL LIGHT HANDLE (MISCELLANEOUS) ×4 IMPLANT
CRADLE DONUT ADULT HEAD (MISCELLANEOUS) ×2 IMPLANT
DEVICE CARDIOBLATE CARDIAC ABL (MISCELLANEOUS) IMPLANT
DRAIN CHANNEL 32F RND 10.7 FF (WOUND CARE) ×1 IMPLANT
DRAPE SLUSH MACHINE 52X66 (DRAPES) IMPLANT
DRAPE SLUSH/WARMER DISC (DRAPES) ×1 IMPLANT
DRSG COVADERM 4X14 (GAUZE/BANDAGES/DRESSINGS) ×2 IMPLANT
ELECT REM PT RETURN 9FT ADLT (ELECTROSURGICAL) ×4
ELECTRODE REM PT RTRN 9FT ADLT (ELECTROSURGICAL) ×2 IMPLANT
GLOVE BIO SURGEON STRL SZ 6 (GLOVE) ×3 IMPLANT
GLOVE BIO SURGEON STRL SZ 6.5 (GLOVE) ×4 IMPLANT
GLOVE BIO SURGEON STRL SZ7.5 (GLOVE) ×2 IMPLANT
GLOVE BIOGEL PI IND STRL 6.5 (GLOVE) IMPLANT
GLOVE BIOGEL PI INDICATOR 6.5 (GLOVE) ×2
GLOVE EUDERMIC 7 POWDERFREE (GLOVE) ×6 IMPLANT
GOWN STRL NON-REIN LRG LVL3 (GOWN DISPOSABLE) ×11 IMPLANT
HEMOSTAT POWDER SURGIFOAM 1G (HEMOSTASIS) ×7 IMPLANT
HEMOSTAT SURGICEL 2X14 (HEMOSTASIS) ×2 IMPLANT
INSERT FOGARTY SM (MISCELLANEOUS) ×1 IMPLANT
INSERT FOGARTY XLG (MISCELLANEOUS) IMPLANT
KIT BASIN OR (CUSTOM PROCEDURE TRAY) ×2 IMPLANT
KIT ROOM TURNOVER OR (KITS) ×2 IMPLANT
KIT SUCTION CATH 14FR (SUCTIONS) ×4 IMPLANT
LEAD PACING MYOCARDI (MISCELLANEOUS) ×1 IMPLANT
LINE VENT (MISCELLANEOUS) ×1 IMPLANT
LOOP VESSEL SUPERMAXI WHITE (MISCELLANEOUS) ×1 IMPLANT
NS IRRIG 1000ML POUR BTL (IV SOLUTION) ×12 IMPLANT
PACK OPEN HEART (CUSTOM PROCEDURE TRAY) ×2 IMPLANT
PAD ARMBOARD 7.5X6 YLW CONV (MISCELLANEOUS) ×4 IMPLANT
PEDIATRIC SUCKERS (MISCELLANEOUS) ×1 IMPLANT
PROBE CRYO2-ABLATION MALLABLE (MISCELLANEOUS) IMPLANT
SET CARDIOPLEGIA MPS 5001102 (MISCELLANEOUS) ×1 IMPLANT
SPONGE GAUZE 4X4 12PLY (GAUZE/BANDAGES/DRESSINGS) ×4 IMPLANT
SUT BONE WAX W31G (SUTURE) ×2 IMPLANT
SUT ETHIBON 2 0 V 52N 30 (SUTURE) ×4 IMPLANT
SUT ETHIBON EXCEL 2-0 V-5 (SUTURE) IMPLANT
SUT ETHIBOND 2 0 SH (SUTURE) ×4
SUT ETHIBOND 2 0 SH 36X2 (SUTURE) ×1 IMPLANT
SUT ETHIBOND 2 0 V4 (SUTURE) IMPLANT
SUT ETHIBOND 2 0V4 GREEN (SUTURE) IMPLANT
SUT ETHIBOND 4 0 RB 1 (SUTURE) IMPLANT
SUT ETHIBOND V-5 VALVE (SUTURE) IMPLANT
SUT PROLENE 3 0 SH 1 (SUTURE) IMPLANT
SUT PROLENE 3 0 SH DA (SUTURE) ×2 IMPLANT
SUT PROLENE 4 0 RB 1 (SUTURE) ×12
SUT PROLENE 4-0 RB1 .5 CRCL 36 (SUTURE) IMPLANT
SUT SILK  1 MH (SUTURE) ×1
SUT SILK 1 MH (SUTURE) ×1 IMPLANT
SUT STEEL 6MS V (SUTURE) ×1 IMPLANT
SUT STEEL SZ 6 DBL 3X14 BALL (SUTURE) ×6 IMPLANT
SUT VIC AB 1 CTX 36 (SUTURE) ×4
SUT VIC AB 1 CTX36XBRD ANBCTR (SUTURE) ×2 IMPLANT
SUT VIC AB 2-0 CTX 27 (SUTURE) IMPLANT
SUT VIC AB 3-0 X1 27 (SUTURE) IMPLANT
SUTURE E-PAK OPEN HEART (SUTURE) ×2 IMPLANT
SYR 10ML KIT SKIN ADHESIVE (MISCELLANEOUS) IMPLANT
SYS ATRICLIP LAA EXCLUSION 45 (CLIP) IMPLANT
SYSTEM SAHARA CHEST DRAIN ATS (WOUND CARE) ×2 IMPLANT
TAPE CLOTH SURG 4X10 WHT LF (GAUZE/BANDAGES/DRESSINGS) ×1 IMPLANT
TOWEL OR 17X24 6PK STRL BLUE (TOWEL DISPOSABLE) ×3 IMPLANT
TOWEL OR 17X26 10 PK STRL BLUE (TOWEL DISPOSABLE) ×3 IMPLANT
TRAY FOLEY IC TEMP SENS 14FR (CATHETERS) ×2 IMPLANT
TUBE FEEDING 8FR 16IN STR KANG (MISCELLANEOUS) ×2 IMPLANT
UNDERPAD 30X30 INCONTINENT (UNDERPADS AND DIAPERS) ×2 IMPLANT
VALVE MAGNA EASE AORTIC 23MM (Prosthesis & Implant Heart) ×1 IMPLANT
VENT LEFT HEART 12002 (CATHETERS) ×2
VRC MALLEABLE SINGLE STG 28FR (MISCELLANEOUS) ×2
WATER STERILE IRR 1000ML POUR (IV SOLUTION) ×4 IMPLANT

## 2012-04-25 NOTE — Preoperative (Signed)
Beta Blockers   Reason not to administer Beta Blockers:Not Applicable 

## 2012-04-25 NOTE — Plan of Care (Signed)
Problem: Phase II Progression Outcomes Goal: Tolerates weaning with O2 Sat > 90 Outcome: Progressing Weaning FiO2 to 40 % and on CPAP/PS

## 2012-04-25 NOTE — Interval H&P Note (Signed)
History and Physical Interval Note:  04/25/2012 8:21 AM  [name] Police  has presented today for surgery, with the diagnosis of aortic stenosis  The various methods of treatment have been discussed with the patient and family. After consideration of risks, benefits and other options for treatment, the patient has consented to  Procedure(s) (LRB) with comments: AORTIC VALVE REPLACEMENT (AVR) (N/A) MAZE (N/A) REDO STERNOTOMY (N/A) as a surgical intervention .  The patient's history has been reviewed, patient examined, no change in status, stable for surgery.  I have reviewed the patient's chart and labs.  Questions were answered to the patient's satisfaction.     [name]  Patient was admitted last week due to uncontrolled diabetes with CBG in the 400-500 range. CBG this AM = 147 Patient aware of risks and benefits. He understands risks include but are not limited to death, stroke, MI, DVT, PE, bleeding, need for transfusion, infection, gastrointestinal, renal and respiratory complications, complete heart block requiring permanent pacemaker placement, as well as the possibility of unforeseeable complications. He wishes to proceed.

## 2012-04-25 NOTE — Progress Notes (Signed)
*  PRELIMINARY RESULTS* Echocardiogram Echocardiogram Transesophageal has been performed.  [name] 04/25/2012, 9:49 AM

## 2012-04-25 NOTE — Procedures (Signed)
Extubation Procedure Note  Patient Details:   Name: [name] DOB: 06/21/1946 MRN: [id number]   Airway Documentation:  Airway 8 mm (Active)  Secured at (cm) 22 cm 04/25/2012  2:51 PM  Measured From Lips 04/25/2012  2:51 PM  Secured Location Right 04/25/2012  2:51 PM  Secured By Pink Tape 04/25/2012  2:51 PM  Site Condition Dry 04/25/2012  2:51 PM    Evaluation  O2 sats: stable throughout Complications: No apparent complications Patient did tolerate procedure well. Bilateral Breath Sounds: Clear Suctioning: Airway Yes  Ruhi Kopke J 04/25/2012, 7:39 PM

## 2012-04-25 NOTE — Progress Notes (Signed)
Pt extubated per rapid wean protocol. NIF -60, VC 1.5 L. Positive for cuff leak. Vital signs stable. Pt can speak. No adverse effects noted. Pt resting on 4 L [place].

## 2012-04-25 NOTE — H&P (View-Only) (Signed)
PCP is [name], MD Referring Provider is Tonny Bollman, MD  Chief Complaint  Patient presents with  . Coronary Artery Disease    eval for surgery  . Aortic Stenosis    HPI: 66 year old gentleman with a history of coronary disease and aortic stenosis who was recently admitted after an episode of chest pain. His primary complaint today is shortness of breath with exertion.  Mr. Stallbaumer is a 66 year old gentleman who had coronary bypass grafting x3 in 2004. He has multiple medical problems including atrial fibrillation, congestive heart failure, cirrhosis, thrombocytopenia, arthritis, and frequent gastrointestinal bleeds. He had been having difficulty with shortness of breath with exertion which has been treated with paracentesis in [place] about a month ago. His shortness of breath improved significantly after that procedure was done, but did not resolve entirely. In mid August he had an episode of chest pressure and went to the hospital. He was transferred to [facility] for further evaluation. He had cardiac catheterization which showed patent grafts and moderate three-vessel disease. He also had a TEE which showed normal LV function with ejection fraction of 65%, severe left ear with mild AI and mild MR. He also had an abdominal ultrasound which showed some evidence of cirrhosis, but no evidence of portal hypertension and only trace ascites. He improved symptomatically with diuresis and was discharged. He now returns to further discuss treatment of his aortic stenosis.  Other than the one showed noted above he has not had chest pain. He does have shortness of breath with exertion. He finds is very frustrating and is not able to engage in all the activities he would like to do. He is not currently having orthopnea, PND or peripheral edema.  Dating back to January of 2012 he has had frequent gastrointestinal bleeding episodes. He required 4 units of blood for a bleed in January of that year. He then  had 9- 10 hospitalizations since then for recurrent gastrointestinal bleeds. He's had extensive workup with scans and endoscopies with no site identified.  Past Medical History  Diagnosis Date  . Overweight   . Coronary atherosclerosis of artery bypass graft   . Atrial fibrillation     Permanent; off of Coumadin for now due to GI bleed  . Shortness of breath   . Type II or unspecified type diabetes mellitus without mention of complication, not stated as uncontrolled   . OSA (obstructive sleep apnea)   . Insomnia   . Carotid bruit 06/14/2011  . Angina   . Hypertension   . CHF (congestive heart failure)   . GERD (gastroesophageal reflux disease)   . COPD (chronic obstructive pulmonary disease)   . Iron deficiency anemia     Requiring intravenous iron  . H/O hiatal hernia   . AVM (arteriovenous malformation)     Recurrent GI bleeding requiring multiple transfusions  . Hyperlipidemia   . Thrombocytopenia   . Ascites     status post paracentesis with removal of 3.4 L of ascitic fluid  . Osteoarthritis   . Mediastinal adenopathy 09/22/2011  . Cirrhosis   . Iron deficiency anemia   . Mediastinal adenopathy 09/22/2011  . Chronic diastolic CHF (congestive heart failure) 03/02/2012  . Ascites 09/20/2011  . Permanent atrial fibrillation 03/02/2012  . Aortic stenosis 03/08/2012  . 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  Coronary artery bypass graft  10/15/2002     [facility] Decent. Dorris Fetch, M.D.     . Carpal tunnel release   10/08/2003  . Lipoma surgery   . Hernia repair   . Other surgical history 08/26/2011    [facility],  enteroscopy , revealing "three-four AVMs."   . Tee without cardioversion 03/07/2012    Procedure: TRANSESOPHAGEAL ECHOCARDIOGRAM (TEE);  Surgeon: Vesta Mixer, MD;  Location: [facility] ENDOSCOPY;  Service: Cardiovascular;   Laterality: N/A;  . Coronary angioplasty with stent placement 01/19/2005    drug eluting stent to high grade ostial stenosis of radial artery graft to OM    Family History  Problem Relation Age of Onset  . Coronary artery disease      FAMILY HISTORY  . Hypertension Father   . Diabetes Father   . Heart disease Father   . COPD Sister   . Cancer Maternal Aunt     Breast cancer   . Cancer Maternal Aunt     Breast cancer   . Diabetes Son   . Cancer Daughter     Cervical cancer    Social History History  Substance Use Topics  . Smoking status: Former Smoker -- 1.0 packs/day for 30 years    Types: Cigarettes    Quit date: 07/25/2000  . Smokeless tobacco: Never Used  . Alcohol Use: 0.6 oz/week    1 Shots of liquor per week     Occasionally    Current Outpatient Prescriptions  Medication Sig Dispense Refill  . Ascorbic Acid (VITAMIN C) 1000 MG tablet Take 1,000 mg by mouth 2 (two) times daily.      [name] aspirin EC 81 MG tablet Take 1 tablet (81 mg total) by mouth daily.      [name] atorvastatin (LIPITOR) 20 MG tablet Take 20 mg by mouth at bedtime.       . digoxin (LANOXIN) 0.25 MG tablet Take 1 tablet (250 mcg total) by mouth daily.  90 tablet  3  . ferrous gluconate (FERGON) 325 MG tablet Take 325 mg by mouth 3 (three) times daily with meals.      . furosemide (LASIX) 80 MG tablet Take 80 mg by mouth 2 (two) times daily.      [name] glimepiride (AMARYL) 4 MG tablet Take 4 mg by mouth daily with supper.       . isosorbide mononitrate (IMDUR) 30 MG 24 hr tablet Take 1 tablet (30 mg total) by mouth daily.  30 tablet  11  . linagliptin (TRADJENTA) 5 MG TABS tablet Take 1 tablet (5 mg total) by mouth daily.  30 tablet  6  . lisinopril (PRINIVIL,ZESTRIL) 10 MG tablet Take 1 tablet (10 mg total) by mouth daily.  90 tablet  3  . metFORMIN (GLUCOPHAGE) 1000 MG tablet Take 1,000 mg by mouth 2 (two) times daily with a meal.       . metoprolol (LOPRESSOR) 50 MG tablet Take 1 tablet (50 mg total) by  mouth 2 (two) times daily.  270 tablet  3  . Multiple Vitamin (MULTIVITAMIN) tablet Take 1 tablet by mouth daily.       . nitroGLYCERIN (NITROSTAT) 0.4 MG SL tablet Place 0.4 mg under the tongue every 5 (five) minutes x 3 doses as needed. For chest pain      . Omega-3 Fatty Acids (FISH OIL) 1200 MG CAPS Take 1,200 mg by mouth 2 (two) times daily.       [name] omeprazole (PRILOSEC) 20 MG capsule Take  20 mg by mouth 2 (two) times daily.       . potassium chloride (KLOR-CON M15) 15 MEQ tablet Take 2 tablets (30 mEq total) by mouth 2 (two) times daily.  60 tablet  6  . sitaGLIPtan (JANUVIA) 100 MG tablet Take 100 mg by mouth daily.       . verapamil (CALAN) 80 MG tablet Take 80 mg by mouth 3 (three) times daily.      . nitroGLYCERIN (NITROSTAT) 0.4 MG SL tablet Place 1 tablet (0.4 mg total) under the tongue every 5 (five) minutes as needed for chest pain.  25 tablet  3    Allergies  Allergen Reactions  . Codeine Other (See Comments)    Hurting in chest  . Diltiazem Hcl Itching  . Niacin Other (See Comments)    Hot flashes    Review of Systems  Constitutional: Positive for activity change and fatigue.  HENT: Positive for hearing loss.   Respiratory: Positive for shortness of breath.   Cardiovascular: Positive for chest pain (pressure on one occasion leading to recent hospitalization).       Chronic atrial fibrillation  Gastrointestinal: Positive for abdominal distention.       Recurrent GI bleeds, no source identified, melena  Genitourinary: Positive for frequency.  Musculoskeletal: Positive for arthralgias.  Neurological: Positive for dizziness.  Hematological: Bruises/bleeds easily.  All other systems reviewed and are negative.    BP 137/57  Pulse 69  Resp 16  Ht 5\' 8"  (1.727 m)  Wt 255 lb (115.667 kg)  BMI 38.77 kg/m2  SpO2 97% Physical Exam  Constitutional: He is oriented to person, place, and time.       Obese   HENT:  Head: Normocephalic and atraumatic.  Eyes: EOM are  normal. Pupils are equal, round, and reactive to light.  Neck: Neck supple. No thyromegaly present.       + bruits vs. transmitted murmur bilaterally  Cardiovascular: Intact distal pulses.   Murmur (3/6 systolic) heard.      Irregularly, irregular  Pulmonary/Chest: Effort normal.       Diminished BS in bases  Abdominal: Soft. There is no tenderness.       distended  Musculoskeletal: He exhibits no edema.  Lymphadenopathy:    He has no cervical adenopathy.  Neurological: He is alert and oriented to person, place, and time.  Skin: Skin is warm and dry.     Diagnostic Tests: Cardiac catheterization as previously noted  Transesophageal echocardiogram as previously noted  Albumin 3.9, total protein 7.9 AST 59, ALT 60, total bili 0.6 Hemoglobin 13.3 hematocrit 40.6 Platelets 135,000  Pulmonary function testing 03/21/11 FVC 3.23, 82% FEV1 2.37, 77% TLC 5.24, 85% DLCO 21.5, 74%  Impression: 66 year old gentleman with history of coronary disease who now has severe aortic stenosis. He has a complex past medical history and multiple chronic medical problems. His aortic stenosis progressed to the point that it is symptomatic and requires intervention. The question is whether he would be a candidate for redo sternotomy for replacement or whether a percutaneous approach would be preferred. He certainly would be relatively high risk for open surgery given his comorbidities and previous bypasses. However, it appears that there is no contraindication to open surgery unless his gastroenterologist feels that his cirrhosis is severe enough to preclude the possibility. We're awaiting his appointment with Dr. Karilyn Cota in [place] for further information in that regard. There may be a combination of factors including his aortic stenosis, body habitus, and cirrhosis/ascites contributing  to his shortness of breath. However it appears that the aortic stenosis is the primary issue.  He would not be a candidate  for a mechanical valve given his frequent GI bleeding. However he would be a candidate for tissue valve would be done open or percutaneously. 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 This is not uncommon association and the gastrointestinal bleeding often decreases or resolves after aortic valve replacement. He has wife understand there is no way that we can be sure of that outcome.  Again the key issue in regards to his risk of surgery really is the risk related to his cirrhosis. With no evidence of portal hypertension and a normal albumin I suspect his risk is acceptable, but will await Dr. Patty Sermons opinion prior to making a final determination.   Plan: Patient has an appointment to see Dr. Karilyn Cota on 04/17/2012. We will contact his office to see if we can move that up at all.  I will plan to see him back in the office after that assessment to finalize our plans regarding treatment of his aortic stenosis.

## 2012-04-25 NOTE — Progress Notes (Signed)
Patient ID: [name], male   DOB: Sep 13, 1945, 66 y.o.   MRN: [id number]   SICU Evening Rounds:  Hemodynamically stable  CI = 1.9 on no inotropes  Still on vent but weaning  Urine output good  CT output low  CBC    Component Value Date/Time   WBC 5.6 04/25/2012 1500   WBC 5.1 02/20/2012 0757   RBC 3.65* 04/25/2012 1500   RBC 4.29 02/20/2012 0757   HGB 10.2* 04/25/2012 1500   HGB 12.2* 02/20/2012 0757   HCT 30.8* 04/25/2012 1500   HCT 37.5* 02/20/2012 0757   PLT 73* 04/25/2012 1500   PLT 108* 02/20/2012 0757   MCV 84.4 04/25/2012 1500   MCV 87.3 02/20/2012 0757   MCH 27.9 04/25/2012 1500   MCH 28.4 02/20/2012 0757   MCHC 33.1 04/25/2012 1500   MCHC 32.6 02/20/2012 0757   RDW 14.5 04/25/2012 1500   RDW 15.5* 02/20/2012 0757   LYMPHSABS 1.2 04/18/2012 0625   LYMPHSABS 1.3 02/20/2012 0757   MONOABS 0.5 04/18/2012 0625   MONOABS 0.4 02/20/2012 0757   EOSABS 0.2 04/18/2012 0625   EOSABS 0.1 02/20/2012 0757   BASOSABS 0.1 04/18/2012 0625   BASOSABS 0.1 02/20/2012 0757    BMET    Component Value Date/Time   NA 139 04/25/2012 1457   K 3.8 04/25/2012 1457   CL 97 04/23/2012 1536   CO2 19 04/23/2012 1536   GLUCOSE 138* 04/25/2012 1457   BUN 25* 04/23/2012 1536   CREATININE 0.78 04/23/2012 1536   CREATININE 0.96 02/13/2012 1051   CALCIUM 9.8 04/23/2012 1536   GFRNONAA >90 04/23/2012 1536   GFRAA >90 04/23/2012 1536   A/P: stable postop course.

## 2012-04-25 NOTE — Brief Op Note (Addendum)
04/25/2012                   [address]            [place] [address]          [phone]    04/25/2012  1:13 PM  PATIENT:  [name]  66 y.o. male  PRE-OPERATIVE DIAGNOSIS:  aortic stenosis  POST-OPERATIVE DIAGNOSIS:  Aortic Stenosis  PROCEDURE:  Procedure(s): AORTIC VALVE REPLACEMENT (AVR)#23 MAGNAEASE REDO STERNOTOMY  SURGEON:  Surgeon(s): Loreli Slot, MD  PHYSICIAN ASSISTANT: WAYNE GOLD PA-C  ANESTHESIA:   general  PATIENT CONDITION:  ICU - intubated and hemodynamically stable.  PRE-OPERATIVE WEIGHT: 117kg   COMPLICATIONS: NO KNOWN  Tricuspid, calcific, stenotic valve  XC= 65 min CPB= 146 min

## 2012-04-25 NOTE — Transfer of Care (Signed)
Immediate Anesthesia Transfer of Care Note  Patient: [name]  Procedure(s) Performed: Procedure(s) (LRB) with comments: AORTIC VALVE REPLACEMENT (AVR) (N/A) REDO STERNOTOMY (N/A)  Patient Location: SICU  Anesthesia Type: General  Level of Consciousness: Patient remains intubated per anesthesia plan  Airway & Oxygen Therapy: Patient remains intubated per anesthesia plan and Patient placed on Ventilator (see vital sign flow sheet for setting)  Post-op Assessment: Report given to PACU RN and Post -op Vital signs reviewed and stable  Post vital signs: Reviewed and stable  Complications: No apparent anesthesia complications

## 2012-04-25 NOTE — Anesthesia Preprocedure Evaluation (Addendum)
Anesthesia Evaluation  Patient identified by MRN, date of birth, ID band Patient awake    Reviewed: Allergy & Precautions, H&P , NPO status , Patient's Chart, lab work & pertinent test results  Airway Mallampati: II  Neck ROM: full    Dental  (+) Dental Advidsory Given, Edentulous Upper and Edentulous Lower   Pulmonary shortness of breath, sleep apnea , COPDformer smoker,          Cardiovascular hypertension, + angina + CAD, + CABG and +CHF + dysrhythmias Atrial Fibrillation + Valvular Problems/Murmurs AS Rhythm:irregular     Neuro/Psych    GI/Hepatic hiatal hernia, GERD-  ,(+) Cirrhosis -  ascites     , Recent h/o GI bleed   Endo/Other  diabetes, Type obesity  Renal/GU      Musculoskeletal   Abdominal   Peds  Hematology   Anesthesia Other Findings   Reproductive/Obstetrics                          Anesthesia Physical Anesthesia Plan  ASA: IV  Anesthesia Plan: General   Post-op Pain Management:    Induction: Intravenous  Airway Management Planned: Oral ETT  Additional Equipment: Arterial line, CVP, PA Cath and TEE  Intra-op Plan:   Post-operative Plan: Post-operative intubation/ventilation  Informed Consent: I have reviewed the patients History and Physical, chart, labs and discussed the procedure including the risks, benefits and alternatives for the proposed anesthesia with the patient or authorized representative who has indicated his/her understanding and acceptance.   Dental Advisory Given  Plan Discussed with: CRNA and Surgeon  Anesthesia Plan Comments:        Anesthesia Quick Evaluation

## 2012-04-26 ENCOUNTER — Encounter (HOSPITAL_COMMUNITY): Payer: Self-pay | Admitting: Thoracic Surgery (Cardiothoracic Vascular Surgery)

## 2012-04-26 ENCOUNTER — Inpatient Hospital Stay (HOSPITAL_COMMUNITY): Payer: Managed Care, Other (non HMO)

## 2012-04-26 LAB — CBC
HCT: 31.2 % — ABNORMAL LOW (ref 39.0–52.0)
Hemoglobin: 10.1 g/dL — ABNORMAL LOW (ref 13.0–17.0)
MCH: 28.2 pg (ref 26.0–34.0)
MCHC: 32.4 g/dL (ref 30.0–36.0)
MCHC: 32.5 g/dL (ref 30.0–36.0)
Platelets: 78 10*3/uL — ABNORMAL LOW (ref 150–400)
RBC: 3.66 MIL/uL — ABNORMAL LOW (ref 4.22–5.81)
RDW: 15.4 % (ref 11.5–15.5)
WBC: 6.2 10*3/uL (ref 4.0–10.5)

## 2012-04-26 LAB — BASIC METABOLIC PANEL
BUN: 14 mg/dL (ref 6–23)
CO2: 25 mEq/L (ref 19–32)
Chloride: 106 mEq/L (ref 96–112)
GFR calc non Af Amer: >90 mL/min (ref >90)
Glucose, Bld: 111 mg/dL — ABNORMAL HIGH (ref 70–99)
Potassium: 4.5 mEq/L (ref 3.5–5.1)
Sodium: 136 mEq/L (ref 135–145)

## 2012-04-26 LAB — PREPARE PLATELET PHERESIS: Unit division: 0

## 2012-04-26 LAB — POCT I-STAT, CHEM 8
Chloride: 101 mEq/L (ref 96–112)
HCT: 33 % — ABNORMAL LOW (ref 39.0–52.0)
Hemoglobin: 11.2 g/dL — ABNORMAL LOW (ref 13.0–17.0)
Potassium: 4.4 mEq/L (ref 3.5–5.1)

## 2012-04-26 LAB — GLUCOSE, CAPILLARY
Glucose-Capillary: 107 mg/dL — ABNORMAL HIGH (ref 70–99)
Glucose-Capillary: 114 mg/dL — ABNORMAL HIGH (ref 70–99)
Glucose-Capillary: 116 mg/dL — ABNORMAL HIGH (ref 70–99)
Glucose-Capillary: 116 mg/dL — ABNORMAL HIGH (ref 70–99)
Glucose-Capillary: 119 mg/dL — ABNORMAL HIGH (ref 70–99)
Glucose-Capillary: 119 mg/dL — ABNORMAL HIGH (ref 70–99)
Glucose-Capillary: 115 mg/dL — ABNORMAL HIGH (ref 70–99)
Glucose-Capillary: 155 mg/dL — ABNORMAL HIGH (ref 70–99)
Glucose-Capillary: 165 mg/dL — ABNORMAL HIGH (ref 70–99)

## 2012-04-26 LAB — CREATININE, SERUM: Creatinine, Ser: 0.76 mg/dL (ref 0.50–1.35)

## 2012-04-26 MED ORDER — INSULIN GLARGINE 100 UNIT/ML ~~LOC~~ SOLN
40.0000 [IU] | SUBCUTANEOUS | Status: DC
  Administered 2012-04-26 – 2012-04-28 (×3): 40 [IU] via SUBCUTANEOUS

## 2012-04-26 MED ORDER — TRAMADOL HCL 50 MG PO TABS: 100.0000 mg | ORAL_TABLET | Freq: Four times a day (QID) | ORAL | Status: DC | PRN

## 2012-04-26 MED ORDER — TRAMADOL HCL 50 MG PO TABS: 50.0000 mg | ORAL_TABLET | Freq: Four times a day (QID) | ORAL | Status: DC | PRN

## 2012-04-26 MED ORDER — INSULIN ASPART 100 UNIT/ML ~~LOC~~ SOLN: 4.0000 [IU] | Freq: Three times a day (TID) | SUBCUTANEOUS | Status: DC

## 2012-04-26 MED ORDER — POTASSIUM CHLORIDE 10 MEQ/50ML IV SOLN
10.0000 meq | INTRAVENOUS | Status: AC
  Administered 2012-04-26 (×2): 10 meq via INTRAVENOUS

## 2012-04-26 MED ORDER — INSULIN ASPART 100 UNIT/ML ~~LOC~~ SOLN
0.0000 [IU] | SUBCUTANEOUS | Status: DC
Start: 2012-04-26 — End: 2012-04-27
  Administered 2012-04-26 (×3): 2 [IU] via SUBCUTANEOUS
  Administered 2012-04-27: 8 [IU] via SUBCUTANEOUS
  Administered 2012-04-27 (×2): 4 [IU] via SUBCUTANEOUS

## 2012-04-26 MED ORDER — FUROSEMIDE 10 MG/ML IJ SOLN
40.0000 mg | Freq: Once | INTRAMUSCULAR | Status: AC
  Administered 2012-04-26: 40 mg via INTRAVENOUS

## 2012-04-26 MED ORDER — HYDROMORPHONE HCL 2 MG PO TABS: 2.0000 mg | ORAL_TABLET | ORAL | Status: DC | PRN

## 2012-04-26 MED FILL — Potassium Chloride Inj 2 mEq/ML: INTRAVENOUS | Qty: 40 | Status: AC

## 2012-04-26 MED FILL — Magnesium Sulfate Inj 50%: INTRAMUSCULAR | Qty: 10 | Status: AC

## 2012-04-26 NOTE — Progress Notes (Signed)
T[facility] BRIEF SICU PROGRESS NOTE  1 Day Post-Op  S/P Procedure(s) (LRB): AORTIC VALVE REPLACEMENT (AVR) (N/A) REDO STERNOTOMY (N/A)   Complains of soreness in chest. Stable hemodynamics NSR w/ 1st degree AV block BP stable UOP fairly good  Plan: Continue current plan  OWEN,CLARENCE H 04/26/2012 6:00 PM

## 2012-04-26 NOTE — Op Note (Signed)
[name], [name]               ACCOUNT NO.:  [id number]  MEDICAL RECORD NO.:  [id number]  LOCATION:  2309                         FACILITY:  [facility]  PHYSICIAN:  Salvatore Decent. Dorris Fetch, M.D.DATE OF BIRTH:  January 06, 1946  DATE OF PROCEDURE:  04/25/2012 DATE OF DISCHARGE:                              OPERATIVE REPORT   PREOPERATIVE DIAGNOSIS:  Severe aortic stenosis.  POSTOPERATIVE DIAGNOSIS:  Severe aortic stenosis.  PROCEDURE:  Redo median sternotomy, aortic valve replacement with 23-mm [facility] Ease bovine pericardial valve (model number [id number], serial number [id number]).  SURGEON:  Salvatore Decent. Dorris Fetch, MD  FIRST ASSISTANT:  Rowe Clack, PA-C  ANESTHESIA:  General.  FINDINGS:  Transesophageal echocardiography revealed calcified aortic valve with severe aortic stenosis and mild aortic insufficiency. Intraoperatively, severe intrapericardial adhesions, vein grafts with no evidence atherosclerosis, calcific, tricuspid, stenotic aortic valve with heaviest calcification on the right and noncoronary cusps, mild annular calcification.  Postbypass TEE revealed good function of the prosthetic valve with no perivalvular leaks, preserved left ventricular systolic function.  CLINICAL NOTE:  [name] is a 66 year old gentleman who has multiple medical problems and has been experiencing shortness of breath with exertion.  An echocardiogram had revealed severe aortic stenosis with preserved left ventricular function.  Cardiac catheterization showed patent grafts and no hemodynamically significant coronary artery disease.  He was advised to undergo redo sternotomy for aortic valve Replacement. Wwe also discussed the possibility of a maze procedure for treatment of his chronic atrial fibrillation, but the understanding was it only would be done if technically feasible.  The patient's surgery originally had been scheduled for week prior, however, he had uncontrolled diabetes with  hyperglycemia in the 500 range prior to that procedure and was admitted for control of his diabetes and surgery was rescheduled for today.  The patient presents today without new complaints and wished to proceed with surgery.  He understands and accepts the risks as previously noted.  OPERATIVE NOTE:  [name] was brought to the preoperative holding area on April 25, 2012, there the Anesthesia Service placed a Swan-Ganz catheter and an arterial blood pressure monitoring line.  Intravenous antibiotics were administered.  He was taken to the operating room, anesthetized, and intubated.  A Foley catheter was placed. Transesophageal echocardiography was performed.  Please see the separately dictated note for full details.  TEE did reveal preserved left ventricular systolic function.  There was left ventricular hypertrophy.  There was severe aortic stenosis, mild aortic insufficiency, and mild mitral insufficiency.  The chest, abdomen and legs were prepped and draped in usual sterile fashion.  A redo median sternotomy was performed through the patient's previous incision.  The skin was incised.  The subcutaneous tissue was divided with electrocautery.  The wires were cut and removed intact.  The sternum was divided using an oscillating saw dividing the anterior table of the sternum first and then the posterior table along the length of the sternum.  Hemostasis was achieved.  Electrocautery was used to dissect the underlying mediastinal tissue off the sternum to the point that would allow a retractor to be placed.  The retractor then was placed and was gradually opened over the next 20 minutes or  so as the dissection continued.  A pericardial edge was identified.  The upper portion of the pericardium had previously been closed.  There were moderate adhesions anteriorly and inferiorly and more severe adhesions more superiorly and especially along the right atrium.  The anterior surface of  the right ventricle was dissected out.  This dissection was carried to the apex more inferiorly, but the dissection of the mammary artery was left until after the patient was on bypass.  There were relatively minimal adhesions along the diaphragmatic surface and these were taken down easily with sharp dissection.  The dissection of the right atrium and aorta then was rather tedious.  The adhesions were more dense, it was more difficult to identify structures and took great deal of time.  The vein grafts were identified arising from the ascending aorta and appeared normal, they did not appear arterialized and there was no evidence of atherosclerotic disease apparent.  The old aortic cannulation site was used, the Teflon pledgets were debrided.  At this point, the patient was Heparinized. After confirming adequate anticoagulation with ACT Measurement, the aorta was cannulated via concentric 2-0 Ethilon pledgeted pursestring sutures.  The final dissection of the right atrium then was carried out and decision was made to duel venous cannulate.  By this point, it was apparent that the maze procedure would be extremely difficult, but was still under consideration.  A 28- French venous cannula was placed via pursestring suture in the right atrial appendage and directed into the superior vena cava.  This was connected to the bypass circuit and initial bypass was initiated.  A 36- French malleable cannula was placed via pursestring suture in the inferior aspect of the right atrium and directed into the inferior vena cava.  This was then connected to the bypass circuit and full flow was Commenced. Flows were maintained per protocol during the procedure.  The final dissection of the right atrium then was carried out.  The mammary was dissected out with great care, and then, the more posterior and inferior adhesions were taken down.  The superior pulmonary vein on the right side was dissected out  and a left ventricular vent was placed via pursestring suture in the right superior pulmonary vein and directed into the left ventricle.  A pursestring suture was placed in the right atrium and retrograde cardioplegic cannula was placed in the coronary sinus, this took a great deal of time with multiple attempts and it was very difficult to access the coronary sinus and have the catheter pasa a sufficient distance.  Ultimately, however, this was achieved and there was a good pressure tracing with balloon inflation.  Finally, an antegrade cardioplegic cannula was placed in the ascending aorta via 4-0 Prolene pledgeted suture.  At this point due to the extensive dissection and further noting that it was going to be difficult, if not impossible, to do a sufficient maze procedure, the decision was made not to do the Maze procedure and instead just proceed with the aortic valve replacement.  The aorta was crossclamped.  The left ventricle was emptied via the vents.  Cardiac arrest then was achieved with combination of cold antegrade and retrograde blood cardioplegia and topical iced saline. With initial antegrade cardioplegia, there was poor root pressure and poor initial cooling.  Cardioplegia then was administered primarily through the retrograde cannula.  2.5 liters of cardioplegia was administered and the myocardial septal temperature fell to 8 degrees Celsius.  Additional doses of cardioplegia were administered at 15 minutes  intervals via the retrograde cannula.  After achieving a diastolic arrest, but while the cardioplegia was still being administered, an aortotomy was performed. This was placed proximal to the most proximal vein graft and the vein graft proximal anastomoses were not disrupted.  The aortic valve was inspected, it was tricuspid valve with partial fusion of the right and noncoronary cusps and heavy calcification of these two cusps. There was moderate calcification in  the left coronary cusp.  The valve leaflets were excised.  There was mild annular calcification, which was debrided.  The valve annulus sized for a 23-mm [employer] valve.  2-0 Ethibond horizontal mattress sutures with subannular pledgets were placed, 14 sutures were utilized; 4 on the noncoronary portion of the annulus and 5 on each of the left and right portions of the annulus. The valve was prepared per manufacturer's recommendations.  The sutures then were placed through the sewing ring of the valve, which was lowered into place and the sutures were sequentially tied.  The valve leaflets were spread periodically to ensure that the valve was well seated, which it was.  After all the sutures had been tied, the right and left coronary ostia were inspected and were widely patent. The annulus was probed with a fine-tip right-angle and there were no gaps noted.  Rewarming was begun.  The aortotomy then was closed in 2 layers with a running 4-0 Prolene horizontal mattress suture followed by a running 4-0 Prolene simple suture.  After completion of the first layer before tying the suture, de- airing was performed.  It should be noted the carbon dioxide was insufflated into the operative field prior to and during the open portion of the procedure.  After de-airing and tying the horizontal mattress suture, the simple suture was completed, the patient then was placed in Trendelenburg position.  Additional de-airing maneuvers were performed through the aortic root vent.  Lidocaine was administered. The aortic crossclamp was removed.  The total crossclamp time was 65 minutes.  The patient was initially in ventricular fibrillation, but after single defibrillation with 20 joules, there was complete heart block.  A ventricular pacing wire was placed and V pacing was initiated.  The aortotomy had good hemostasis and did not require any additional sutures.  The superior vena cava cannula was  repositioned into the right atrium.  The inferior vena cava cannula was removed.  As the patient rewarmed to 37 degrees Celsius. The left ventricular vent was removed. The retrograde cardioplegia cannula was likewise removed. The ascending aortic cardioplegia cannula remained in place for use as a vent. Epicardial pacing wires were placed on the right atrium and DDD pacing was now initiated.  When the patient had rewarmed to a core temperature of 37 degrees Celsius, he was weaned from cardiopulmonary bypass on the first attempt without difficulty.  The total bypass time was 146 minutes.  There was good function of the prosthetic valve with no perivalvular leak and there was no change in the preserved left ventricular systolic function.  The initial cardiac index was greater than 2 liters/minute/meter squared.  The patient remained hemodynamically stable throughout the postbypass period.  A test dose of protamine was administered and was well tolerated.  The right atrial cannula and aortic cannulae were removed.  The ascending aortic vent was removed. There was good hemostasis at all sites.  The remainder of the protamine was administered without incident.  The patient was given a platelet transfusion to correct thrombocytopenia.  The chest was copiously  irrigated with warm saline.  Hemostasis was achieved.  A 36-French Blake drain was placed on the diaphragmatic surface of the pericardium and a 36-French straight chest tube was placed anteriorly.  There was no ability to close the pericardium.  The sternum was closed with a combination of single and double heavy gauge stainless steel wires. Pectoralis fascia, subcutaneous tissue, and skin were closed in standard fashion.  All sponge, needle, and instrument counts were correct at the end of the procedure.  The patient was taken from the operating room to the surgical intensive care unit in good condition.     Salvatore Decent Dorris Fetch,  M.D.     SCH/MEDQ  D:  04/25/2012  T:  04/26/2012  Job:  [id number]

## 2012-04-26 NOTE — Progress Notes (Signed)
1 Day Post-Op Procedure(s) (LRB): AORTIC VALVE REPLACEMENT (AVR) (N/A) REDO STERNOTOMY (N/A) Subjective: C/o incisional pain  Objective: Vital signs in last 24 hours: Temp:  [95.5 F (35.3 C)-99 F (37.2 C)] 97.3 F (36.3 C) (10/03 0715) Pulse Rate:  [78-80] 79  (10/03 0715) Cardiac Rhythm:  [-] A-V Sequential paced (10/03 0400) Resp:  [0-31] 13  (10/03 0715) BP: (95-110)/(51-70) 107/59 mmHg (10/03 0700) SpO2:  [98 %-100 %] 99 % (10/03 0715) Arterial Line BP: (89-175)/(44-89) 130/56 mmHg (10/03 0715) FiO2 (%):  [40 %-50 %] 40 % (10/02 1825) Weight:  [259 lb 14.8 oz (117.9 kg)-267 lb 10.2 oz (121.4 kg)] 267 lb 10.2 oz (121.4 kg) (10/03 0500)  Hemodynamic parameters for last 24 hours: PAP: (36-67)/(20-35) 60/24 mmHg CO:  [4.4 L/min-6.6 L/min] 6.6 L/min CI:  [1.9 L/min/m2-2.9 L/min/m2] 2.9 L/min/m2  Intake/Output from previous day: 10/02 0701 - 10/03 0700 In: 7191.5 [I.V.:5033.5; Blood:923; NG/GT:30; IV Piggyback:1205] Out: 4065 [Urine:2315; Blood:1400; Chest Tube:350] Intake/Output this shift:    General appearance: alert and no distress Neurologic: no focal deficit Heart: regular rate and rhythm and + rub Lungs: diminished breath sounds bibasilar Abdomen: mildly distended, nontender, + BS  Lab Results:  Basename 04/26/12 0345 04/25/12 2045  WBC 6.2 8.2  HGB 10.1* 10.8*  HCT 31.2* 32.9*  PLT 80* 83*   BMET:  Basename 04/26/12 0345 04/25/12 2045 04/25/12 2034 04/23/12 1536  NA 136 -- 138 --  K 4.5 -- 5.4* --  CL 106 -- 107 --  CO2 25 -- -- 19  GLUCOSE 111* -- 113* --  BUN 14 -- 17 --  CREATININE 0.74 0.78 -- --  CALCIUM 8.3* -- -- 9.8    PT/INR:  Basename 04/25/12 1500  LABPROT 16.1*  INR 1.32   ABG    Component Value Date/Time   PHART 7.328* 04/25/2012 2037   HCO3 23.6 04/25/2012 2037   TCO2 25 04/25/2012 2037   ACIDBASEDEF 2.0 04/25/2012 2037   O2SAT 98.0 04/25/2012 2037   CBG (last 3)   Basename 04/25/12 1811 04/25/12 1703 04/25/12 1609  GLUCAP  109* 105* 116*    Assessment/Plan: S/P Procedure(s) (LRB): AORTIC VALVE REPLACEMENT (AVR) (N/A) REDO STERNOTOMY (N/A) POD # 1 redo sternotomy for AVR CV- s/p pericardial AVR, plan to use baby ASA, avoid coumadin given history of GI bleeds  Currently DDD paced, will AP with a very long PR, asystole with no pacing  RESP- pulmonary hygiene for bibasilar atelectasis  RENAL- lytes, creatinine OK, volume overloaded- diurese  ENDO- CBG well controlled with insulin gtt, transition to lantus, SSI  D/C CT  Anemia secondary to ABL- expected, mild  Thrombocytopenia- avoid heparin, use PAS for DVT prophylaxis  Ambulate   LOS: 1 day    HENDRICKSON,STEVEN C 04/26/2012

## 2012-04-26 NOTE — Care Management Note (Unsigned)
    Page 1 of 1   04/26/2012     1:37:50 PM   CARE MANAGEMENT NOTE 04/26/2012  Patient:  [name], [name]   Account Number:  [id number]  Date Initiated:  04/26/2012  Documentation initiated by:  Jon Kasparek  Subjective/Objective Assessment:   PT S/P AVR ON 04/25/12.  PTA, PT INDEPENDENT, LIVES WITH SPOUSE.     Action/Plan:   MET WITH PT AND WIFE TO DISCUSS DC PLANS.  WIFE TO PROVIDE CARE AT DISCHARGE.  PT HAS RW AT HOME, IF NEEDED.  WILL FOLLOW FOR HOME  NEEDS AS PT PROGRESSES.   Anticipated DC Date:  04/30/2012   Anticipated DC Plan:  HOME W HOME HEALTH SERVICES      DC Planning Services  CM consult      Choice offered to / List presented to:             Status of service:  In process, will continue to follow Medicare Important Message given?   (If response is "NO", the following Medicare IM given date fields will be blank) Date Medicare IM given:   Date Additional Medicare IM given:    Discharge Disposition:    Per UR Regulation:  Reviewed for med. necessity/level of care/duration of stay  If discussed at Long Length of Stay Meetings, dates discussed:    Comments:

## 2012-04-26 NOTE — Anesthesia Postprocedure Evaluation (Signed)
  Anesthesia Post-op Note  Patient: [name]  Procedure(s) Performed: Procedure(s) (LRB) with comments: AORTIC VALVE REPLACEMENT (AVR) (N/A) REDO STERNOTOMY (N/A)  Patient Location: SICU  Anesthesia Type: General  Level of Consciousness: awake, alert , oriented and patient cooperative  Airway and Oxygen Therapy: Patient Spontanous Breathing and Patient connected to nasal cannula oxygen  Post-op Pain: moderate  Post-op Assessment: Post-op Vital signs reviewed, Patient's Cardiovascular Status Stable, Respiratory Function Stable, Patent Airway, No signs of Nausea or vomiting, Adequate PO intake and Pain level controlled  Post-op Vital Signs: Reviewed and stable  Complications: No apparent anesthesia complications

## 2012-04-27 ENCOUNTER — Inpatient Hospital Stay (HOSPITAL_COMMUNITY): Payer: Managed Care, Other (non HMO)

## 2012-04-27 ENCOUNTER — Encounter (HOSPITAL_COMMUNITY): Payer: Self-pay | Admitting: General Practice

## 2012-04-27 LAB — TYPE AND SCREEN
Antibody Screen: POSITIVE
Donor AG Type: NEGATIVE
Donor AG Type: NEGATIVE
Unit division: 0
Unit division: 0

## 2012-04-27 LAB — GLUCOSE, CAPILLARY
Glucose-Capillary: 167 mg/dL — ABNORMAL HIGH (ref 70–99)
Glucose-Capillary: 170 mg/dL — ABNORMAL HIGH (ref 70–99)
Glucose-Capillary: 180 mg/dL — ABNORMAL HIGH (ref 70–99)
Glucose-Capillary: 201 mg/dL — ABNORMAL HIGH (ref 70–99)
Glucose-Capillary: 252 mg/dL — ABNORMAL HIGH (ref 70–99)

## 2012-04-27 LAB — BASIC METABOLIC PANEL
BUN: 12 mg/dL (ref 6–23)
Calcium: 8.5 mg/dL (ref 8.4–10.5)
Creatinine, Ser: 0.79 mg/dL (ref 0.50–1.35)
GFR calc Af Amer: >90 mL/min (ref >90)
GFR calc non Af Amer: >90 mL/min (ref >90)

## 2012-04-27 LAB — CBC
MCHC: 31.9 g/dL (ref 30.0–36.0)
Platelets: 77 10*3/uL — ABNORMAL LOW (ref 150–400)
RDW: 15.4 % (ref 11.5–15.5)
WBC: 9.7 10*3/uL (ref 4.0–10.5)

## 2012-04-27 MED ORDER — METOPROLOL TARTRATE 1 MG/ML IV SOLN
2.5000 mg | INTRAVENOUS | Status: DC | PRN
  Administered 2012-04-28: 2.5 mg via INTRAVENOUS
  Filled 2012-04-27: qty 5

## 2012-04-27 MED ORDER — GUAIFENESIN-DM 100-10 MG/5ML PO SYRP: 15.0000 mL | ORAL_SOLUTION | ORAL | Status: DC | PRN

## 2012-04-27 MED ORDER — SODIUM CHLORIDE 0.9 % IV SOLN: 250.0000 mL | INTRAVENOUS | Status: DC | PRN

## 2012-04-27 MED ORDER — MOVING RIGHT ALONG BOOK
Freq: Once | Status: AC
  Administered 2012-04-27: 11:00:00
  Filled 2012-04-27: qty 1

## 2012-04-27 MED ORDER — ASPIRIN EC 81 MG PO TBEC
81.0000 mg | DELAYED_RELEASE_TABLET | Freq: Every day | ORAL | Status: DC
  Administered 2012-04-27 – 2012-05-01 (×5): 81 mg via ORAL
  Filled 2012-04-27 (×5): qty 1

## 2012-04-27 MED ORDER — INSULIN ASPART 100 UNIT/ML ~~LOC~~ SOLN
0.0000 [IU] | Freq: Every day | SUBCUTANEOUS | Status: DC
  Administered 2012-04-27: 2 [IU] via SUBCUTANEOUS

## 2012-04-27 MED ORDER — INSULIN ASPART 100 UNIT/ML ~~LOC~~ SOLN
0.0000 [IU] | Freq: Three times a day (TID) | SUBCUTANEOUS | Status: DC
  Administered 2012-04-27: 11 [IU] via SUBCUTANEOUS
  Administered 2012-04-27 – 2012-04-28 (×2): 4 [IU] via SUBCUTANEOUS
  Administered 2012-04-28 (×2): 7 [IU] via SUBCUTANEOUS
  Administered 2012-04-29 (×2): 3 [IU] via SUBCUTANEOUS
  Administered 2012-04-29: 4 [IU] via SUBCUTANEOUS
  Administered 2012-04-30: 7 [IU] via SUBCUTANEOUS
  Administered 2012-04-30 – 2012-05-01 (×3): 3 [IU] via SUBCUTANEOUS

## 2012-04-27 MED ORDER — POTASSIUM CHLORIDE CRYS ER 20 MEQ PO TBCR
20.0000 meq | EXTENDED_RELEASE_TABLET | Freq: Two times a day (BID) | ORAL | Status: DC
Start: 2012-04-27 — End: 2012-05-01
  Administered 2012-04-27 – 2012-05-01 (×9): 20 meq via ORAL
  Filled 2012-04-27 (×10): qty 1

## 2012-04-27 MED ORDER — SODIUM CHLORIDE 0.9 % IJ SOLN
3.0000 mL | Freq: Two times a day (BID) | INTRAMUSCULAR | Status: DC
  Administered 2012-04-27 – 2012-05-01 (×8): 3 mL via INTRAVENOUS

## 2012-04-27 MED ORDER — METFORMIN HCL 500 MG PO TABS
1000.0000 mg | ORAL_TABLET | Freq: Two times a day (BID) | ORAL | Status: DC
  Administered 2012-04-27 – 2012-05-01 (×9): 1000 mg via ORAL
  Filled 2012-04-27 (×11): qty 2

## 2012-04-27 MED ORDER — ASPIRIN 81 MG PO CHEW: 81.0000 mg | CHEWABLE_TABLET | Freq: Every day | ORAL | Status: DC

## 2012-04-27 MED ORDER — MAGNESIUM HYDROXIDE 400 MG/5ML PO SUSP: 30.0000 mL | Freq: Every day | ORAL | Status: DC | PRN

## 2012-04-27 MED ORDER — FUROSEMIDE 10 MG/ML IJ SOLN
40.0000 mg | Freq: Once | INTRAMUSCULAR | Status: AC
  Administered 2012-04-27: 40 mg via INTRAVENOUS

## 2012-04-27 MED ORDER — LISINOPRIL 10 MG PO TABS
10.0000 mg | ORAL_TABLET | Freq: Every day | ORAL | Status: DC
  Administered 2012-04-28 – 2012-05-01 (×4): 10 mg via ORAL
  Filled 2012-04-27 (×4): qty 1

## 2012-04-27 MED ORDER — FUROSEMIDE 80 MG PO TABS
80.0000 mg | ORAL_TABLET | Freq: Two times a day (BID) | ORAL | Status: DC
  Administered 2012-04-27 – 2012-05-01 (×8): 80 mg via ORAL
  Filled 2012-04-27 (×10): qty 1

## 2012-04-27 MED ORDER — AMIODARONE IV BOLUS ONLY 150 MG/100ML
150.0000 mg | Freq: Once | INTRAVENOUS | Status: AC
  Administered 2012-04-27: 150 mg via INTRAVENOUS
  Filled 2012-04-27: qty 100

## 2012-04-27 MED ORDER — ALUM & MAG HYDROXIDE-SIMETH 200-200-20 MG/5ML PO SUSP
15.0000 mL | ORAL | Status: DC | PRN
  Administered 2012-04-28: 30 mL via ORAL
  Filled 2012-04-27: qty 30

## 2012-04-27 MED ORDER — INSULIN ASPART 100 UNIT/ML ~~LOC~~ SOLN
4.0000 [IU] | Freq: Three times a day (TID) | SUBCUTANEOUS | Status: DC
  Administered 2012-04-28 – 2012-05-01 (×9): 4 [IU] via SUBCUTANEOUS

## 2012-04-27 MED ORDER — SODIUM CHLORIDE 0.9 % IJ SOLN: 3.0000 mL | INTRAMUSCULAR | Status: DC | PRN

## 2012-04-27 MED ORDER — VERAPAMIL HCL 80 MG PO TABS
80.0000 mg | ORAL_TABLET | Freq: Three times a day (TID) | ORAL | Status: DC
  Administered 2012-04-27 – 2012-05-01 (×12): 80 mg via ORAL
  Filled 2012-04-27 (×14): qty 1

## 2012-04-27 MED ORDER — METOPROLOL TARTRATE 1 MG/ML IV SOLN
5.0000 mg | Freq: Once | INTRAVENOUS | Status: AC
  Administered 2012-04-27: 5 mg via INTRAVENOUS
  Filled 2012-04-27: qty 5

## 2012-04-27 MED FILL — Electrolyte-R (PH 7.4) Solution: INTRAVENOUS | Qty: 4000 | Status: AC

## 2012-04-27 MED FILL — Lidocaine HCl IV Inj 20 MG/ML: INTRAVENOUS | Qty: 5 | Status: AC

## 2012-04-27 MED FILL — Mannitol IV Soln 20%: INTRAVENOUS | Qty: 500 | Status: AC

## 2012-04-27 MED FILL — Sodium Chloride Irrigation Soln 0.9%: Qty: 3000 | Status: AC

## 2012-04-27 MED FILL — Heparin Sodium (Porcine) Inj 1000 Unit/ML: INTRAMUSCULAR | Qty: 30 | Status: AC

## 2012-04-27 MED FILL — Sodium Chloride IV Soln 0.9%: INTRAVENOUS | Qty: 1000 | Status: AC

## 2012-04-27 MED FILL — Heparin Sodium (Porcine) Inj 1000 Unit/ML: INTRAMUSCULAR | Qty: 60 | Status: AC

## 2012-04-27 MED FILL — Sodium Bicarbonate IV Soln 8.4%: INTRAVENOUS | Qty: 50 | Status: AC

## 2012-04-27 NOTE — Progress Notes (Signed)
Pt's HR still low 100's-120's in AFib. Notified PA and stated she would come by and check pt and place some orders. MD Dorris Fetch came by and gave orders for 5mg  metoprolol IV. Gave and pt tolerated well. HR still 110's. MD said to give metoprolol IV PRN if HR >125 sustained. Will continue to monitor.

## 2012-04-27 NOTE — Progress Notes (Signed)
CARDIAC REHAB PHASE I   PRE:  Rate/Rhythm: 104 afib    BP: sitting 123/62    SaO2: 96 2L  MODE:  Ambulation: 150 ft   POST:  Rate/Rhythm: 118 afib    BP: sitting 119/87     SaO2: 92 2L  Pt tolerated fair. C/o fatigue in general. Used RW and 2L, assist x2. HR up to 118 at most upon walking. Maintained stability during walk. Return to chair, left O2 on.  [id number]  Harriet Masson CES, ACSM

## 2012-04-27 NOTE — Progress Notes (Signed)
Amiodarone bolus completed. Pt's HR 90-110's. Pt still alert and oriented. BP 122/76. Pt in Afib. Will continue to monitor.

## 2012-04-27 NOTE — Progress Notes (Signed)
[name] went into a more rapid a fib with rates up to 140-150. He received an amiodarone bolus and now is in the low 100's. He has chronic a fib and is tolerating it well. We had held his rhythm meds due to bradycardia in the early postop period. Will restart verapamil today for rate control.  Would avoid amiodarone except for acute rate control as there is little chance of converting to SR

## 2012-04-27 NOTE — Progress Notes (Signed)
2 Days Post-Op Procedure(s) (LRB): AORTIC VALVE REPLACEMENT (AVR) (N/A) REDO STERNOTOMY (N/A) Subjective: Some incisional pain   Objective: Vital signs in last 24 hours: Temp:  [97.5 F (36.4 C)-99.5 F (37.5 C)] 99.5 F (37.5 C) (10/04 0350) Pulse Rate:  [72-81] 78  (10/04 0700) Cardiac Rhythm:  [-] A-V Sequential paced (10/04 0000) Resp:  [14-23] 23  (10/04 0700) BP: (119-166)/(50-70) 166/65 mmHg (10/04 0700) SpO2:  [88 %-99 %] 93 % (10/04 0700) Arterial Line BP: (151-155)/(58-61) 154/61 mmHg (10/03 1000) Weight:  [265 lb 14 oz (120.6 kg)] 265 lb 14 oz (120.6 kg) (10/04 0600)  Hemodynamic parameters for last 24 hours: PAP: (56-61)/(21-24) 60/24 mmHg  Intake/Output from previous day: 10/03 0701 - 10/04 0700 In: 1495.6 [P.O.:710; I.V.:529.6; IV Piggyback:256] Out: 1970 [Urine:1970] Intake/Output this shift:    General appearance: alert and no distress Neurologic: intact Heart: irregular rhythm, 2/6 systolic murmur Lungs: diminished breath sounds bibasilar Abdomen: mildly distended, nontender  Lab Results:  Basename 04/27/12 0400 04/26/12 1738 04/26/12 1729  WBC 9.7 -- 7.7  HGB 10.3* 11.2* --  HCT 32.3* 33.0* --  PLT 77* -- 78*   BMET:  Basename 04/27/12 0400 04/26/12 1738 04/26/12 0345  NA 133* 137 --  K 4.5 4.4 --  CL 101 101 --  CO2 24 -- 25  GLUCOSE 187* 146* --  BUN 12 11 --  CREATININE 0.79 1.00 --  CALCIUM 8.5 -- 8.3*    PT/INR:  Basename 04/25/12 1500  LABPROT 16.1*  INR 1.32   ABG    Component Value Date/Time   PHART 7.328* 04/25/2012 2037   HCO3 23.6 04/25/2012 2037   TCO2 23 04/26/2012 1738   ACIDBASEDEF 2.0 04/25/2012 2037   O2SAT 98.0 04/25/2012 2037   CBG (last 3)   Basename 04/27/12 0349 04/27/12 0005 04/26/12 2011  GLUCAP 167* 180* 165*    Assessment/Plan: S/P Procedure(s) (LRB): AORTIC VALVE REPLACEMENT (AVR) (N/A) REDO STERNOTOMY (N/A) POD # 2 AVR CV- in rate controlled a fib (his baseline rhythm) with rate in 55- 65 range,  beta blocker stopped  S/p pericardial AVR  No coumadin due to history of frequent and recurrent GIB  RESP- bibasilar atelectasis, pulmonary hygiene  RENAL- still volume overloaded, diurese  Anemia- stable  Thrombocytopenia- stable  Endocrine- CBG reasonably well controlled- restart metformin   LOS: 2 days    HENDRICKSON,STEVEN C 04/27/2012

## 2012-04-27 NOTE — Progress Notes (Signed)
Received pt from 2300. Pt's HR 50's-70's. Notified by monitor techs that pt's HR 120's. Pt's HR has been 110-150's Afib for the last 5 minutes. Notified PA of this and received order for amio 150mg  bolus. Pt alert and oriented. BP 130/69. Called rapid response to check pt as well. Will continue to monitor.

## 2012-04-28 ENCOUNTER — Inpatient Hospital Stay (HOSPITAL_COMMUNITY): Payer: Managed Care, Other (non HMO)

## 2012-04-28 LAB — CBC
HCT: 31.4 % — ABNORMAL LOW (ref 39.0–52.0)
MCHC: 32.2 g/dL (ref 30.0–36.0)
MCV: 85.1 fL (ref 78.0–100.0)
Platelets: 102 10*3/uL — ABNORMAL LOW (ref 150–400)
RDW: 15.3 % (ref 11.5–15.5)

## 2012-04-28 LAB — BASIC METABOLIC PANEL
BUN: 14 mg/dL (ref 6–23)
Creatinine, Ser: 0.74 mg/dL (ref 0.50–1.35)
GFR calc Af Amer: >90 mL/min (ref >90)
GFR calc non Af Amer: >90 mL/min (ref >90)
Potassium: 4 mEq/L (ref 3.5–5.1)

## 2012-04-28 MED ORDER — METOPROLOL TARTRATE 12.5 MG HALF TABLET
12.5000 mg | ORAL_TABLET | Freq: Two times a day (BID) | ORAL | Status: DC
  Administered 2012-04-28 – 2012-04-29 (×4): 12.5 mg via ORAL
  Filled 2012-04-28 (×6): qty 1

## 2012-04-28 MED ORDER — INSULIN GLARGINE 100 UNIT/ML ~~LOC~~ SOLN
15.0000 [IU] | Freq: Every day | SUBCUTANEOUS | Status: DC
  Administered 2012-04-28: 15 [IU] via SUBCUTANEOUS
  Administered 2012-04-29: 22:00:00 via SUBCUTANEOUS
  Administered 2012-04-30: 15 [IU] via SUBCUTANEOUS

## 2012-04-28 MED ORDER — INSULIN DETEMIR 100 UNIT/ML ~~LOC~~ SOLN
50.0000 [IU] | Freq: Every day | SUBCUTANEOUS | Status: DC
  Administered 2012-04-29 – 2012-05-01 (×3): 50 [IU] via SUBCUTANEOUS
  Filled 2012-04-28: qty 10

## 2012-04-28 NOTE — Progress Notes (Addendum)
                    [address]            [place] [address]          [phone]     3 Days Post-Op Procedure(s) (LRB): AORTIC VALVE REPLACEMENT (AVR) (N/A) REDO STERNOTOMY (N/A)  Subjective: Feels a little better today.  Ate a good breakfast. Main complaint is gas pain and constipation. In AF, rates vary 100-120s.    Objective: Vital signs in last 24 hours: Patient Vitals for the past 24 hrs:  BP Temp Temp src Pulse Resp SpO2 Weight  04/28/12 0548 116/73 mmHg 98.8 F (37.1 C) Oral 126  20  97 % 261 lb 14.5 oz (118.8 kg)  04/27/12 2123 127/93 mmHg 98.3 F (36.8 C) Oral 131  20  98 % -  04/27/12 1530 121/77 mmHg - - 114  - - -  04/27/12 1418 139/64 mmHg 99.1 F (37.3 C) Oral 109  18  92 % -  04/27/12 1112 141/49 mmHg 97.7 F (36.5 C) Oral 60  19  95 % -  04/27/12 1000 148/49 mmHg - - 50  20  91 % -  04/27/12 0900 146/65 mmHg - - 58  19  95 % -   Current Weight  04/28/12 261 lb 14.5 oz (118.8 kg)   PRE-OPERATIVE WEIGHT: 117kg   Intake/Output from previous day: 10/04 0701 - 10/05 0700 In: 590 [P.O.:480; I.V.:60; IV Piggyback:50] Out: 1885 [Urine:1885]  CBGs 170-201-196-194  PHYSICAL EXAM:  Heart: Irr irr, tachy aournd 110 Lungs: clear Wound: clean and dry Extremities: mild LE edema    Lab Results: CBC: Basename 04/28/12 0500 04/27/12 0400  WBC 6.7 9.7  HGB 10.1* 10.3*  HCT 31.4* 32.3*  PLT 102* 77*   BMET:  Basename 04/28/12 0500 04/27/12 0400  NA 135 133*  K 4.0 4.5  CL 99 101  CO2 27 24  GLUCOSE 196* 187*  BUN 14 12  CREATININE 0.74 0.79  CALCIUM 8.8 8.5    PT/INR:  Basename 04/25/12 1500  LABPROT 16.1*  INR 1.32   CXR:  Findings: Right IJ sheath has been removed in the interval. Heart  size and mediastinal contours are stable. There is a small amount  of pneumomediastinum. Bibasilar atelectasis and small bilateral  pleural effusions persist. There is no pneumothorax.  IMPRESSION:  Stable postoperative chest with  mild bibasilar atelectasis and  small bilateral pleural effusions. No pneumothorax.    Assessment/Plan: S/P Procedure(s) (LRB): AORTIC VALVE REPLACEMENT (AVR) (N/A) REDO STERNOTOMY (N/A)  CV- Chronic AF,currently with variable rates. Had bradycardia in ICU and not started on beta blocker.  On home dose Dig, lisinopril. Since HRs have been sustained in 100s, will resume low dose po Lopressor, Verapamil started yesterday.  Prn Lopressor for rate control.  Watch HR- still connected to external pacer.  GI- will Rx LOC today.  DM- Pt takes Levemir in the morning and Lantus at night at home. Already received am Lantus here this am.  Will resume Levemir, change to pm Lantus since CBGs remain high. Continue po metformin.  Vol overload- diurese.  Thrombocytopenia- resolving.  CRPI.   LOS: 3 days    COLLINS,GINA H 04/28/2012   I have seen and examined [name] and agree with the above assessment  and plan.  Delight Ovens MD Beeper [phone] Office [phone] 04/28/2012 11:48 AM

## 2012-04-28 NOTE — Progress Notes (Signed)
Pt heartrate maintaining <100 on montitor, pt ambulated 300 feet x1 assist with walker.heart rate did increase to 140s while ambulating at times but did not maintain at that rate. Back in room rate 98-115. Slightly tired by end of walk but tolerated well. Assisted to restroom and then back to chair. sats 93-94%on RA, will continue to monitor patient. Stanely Sexson, Randall An RN

## 2012-04-28 NOTE — Progress Notes (Signed)
0930 HR 140s atrial fib at this time. Will hold ambulation and followup later. Tayanna Talford DunlapRN

## 2012-04-28 NOTE — Progress Notes (Signed)
CARDIAC REHAB PHASE I   PRE:  Rate/Rhythm: 76afib  BP:  Supine:   Sitting: 111/61  Standing:    SaO2: 98%2L  MODE:  Ambulation: 210 ft   POST:  Rate/Rhythem: 103afib  BP:  Supine:   Sitting: 126/62  Standing:    SaO2: 96%2L 1200-1220 Heart rate better now. Walked 210 ft on 2L with rolling walker and asst x 1. Tired by end of walk but tolerated well.  Back to recliner with call bell.  [name]

## 2012-04-29 LAB — GLUCOSE, CAPILLARY
Glucose-Capillary: 195 mg/dL — ABNORMAL HIGH (ref 70–99)
Glucose-Capillary: 149 mg/dL — ABNORMAL HIGH (ref 70–99)

## 2012-04-29 NOTE — Plan of Care (Signed)
Problem: Phase III Progression Outcomes Goal: Dysrhythmias controlled Outcome: Not Progressing Pt in atrial fib. Alexi Geibel, Randall An RN

## 2012-04-29 NOTE — Progress Notes (Addendum)
                    [address]            [place] [address]          [phone]     4 Days Post-Op Procedure(s) (LRB): AORTIC VALVE REPLACEMENT (AVR) (N/A) REDO STERNOTOMY (N/A)  Subjective: Feels much better today.  +BM, eating well.  No complaints.   Objective: Vital signs in last 24 hours: Patient Vitals for the past 24 hrs:  BP Temp Temp src Pulse Resp SpO2 Weight  04/29/12 0522 - - - - - - 261 lb 9.6 oz (118.661 kg)  04/29/12 0521 129/68 mmHg 98.6 F (37 C) Oral 81  20  96 % -  04/28/12 1947 122/88 mmHg 98.7 F (37.1 C) Oral 99  18  100 % -  04/28/12 1713 112/54 mmHg - - - - - -  04/28/12 1416 103/54 mmHg 98.9 F (37.2 C) - 80  18  97 % -  04/28/12 1044 110/63 mmHg - - 114  - - -   Current Weight  04/29/12 261 lb 9.6 oz (118.661 kg)  PRE-OPERATIVE WEIGHT: 117kg    Intake/Output from previous day: 10/05 0701 - 10/06 0700 In: 723 [P.O.:720; I.V.:3] Out: 1550 [Urine:1550]  CBGs 202-171-149   PHYSICAL EXAM:  Heart: Irr irr Lungs: clear Wound: clean and dry Extremities: mild LE edema    Lab Results: CBC: Basename 04/28/12 0500 04/27/12 0400  WBC 6.7 9.7  HGB 10.1* 10.3*  HCT 31.4* 32.3*  PLT 102* 77*   BMET:  Basename 04/28/12 0500 04/27/12 0400  NA 135 133*  K 4.0 4.5  CL 99 101  CO2 27 24  GLUCOSE 196* 187*  BUN 14 12  CREATININE 0.74 0.79  CALCIUM 8.8 8.5    PT/INR: No results found for this basename: LABPROT,INR in the last 72 hours    Assessment/Plan: S/P Procedure(s) (LRB): AORTIC VALVE REPLACEMENT (AVR) (N/A) REDO STERNOTOMY (N/A) CV- Chronic AF, rates better controlled.  Appears to be tolerating low dose beta blocker. Still has elevated rates with activity.  Continue Dig, lisinopril, Verapamil. Has not had any bradycardia requiring pacing.  Will disconnect pacer,  roll and tape wires.   DM- Now back on home meds.  Watch CBGs closely. Vol overload- diurese.  CRPI.   LOS: 4 days    COLLINS,GINA  H 04/29/2012   I have seen and examined [name] and agree with the above assessment  and plan.  Delight Ovens MD Beeper [phone] Office [phone] 04/29/2012 10:22 AM

## 2012-04-29 NOTE — Progress Notes (Signed)
Patient ambulated 400 feet with walker x1 assist. Pt tolerated well. Assisted back to chair call bell with in reach. Will monitor patient. Nyasia Baxley, Randall An RN

## 2012-04-29 NOTE — Progress Notes (Signed)
Patient ambulated in hallway 300 feet x1 assist with walker gain is steady, pt tolerated well. Will monitor patient. Johnmichael Melhorn, Randall An RN

## 2012-04-30 LAB — GLUCOSE, CAPILLARY: Glucose-Capillary: 84 mg/dL (ref 70–99)

## 2012-04-30 MED ORDER — TRAMADOL HCL 50 MG PO TABS: 50.0000 mg | ORAL_TABLET | Freq: Four times a day (QID) | ORAL | Status: DC | PRN

## 2012-04-30 MED ORDER — ASPIRIN 81 MG PO TBEC: 81.0000 mg | DELAYED_RELEASE_TABLET | Freq: Every day | ORAL | Status: DC

## 2012-04-30 MED ORDER — METOPROLOL TARTRATE 25 MG PO TABS
25.0000 mg | ORAL_TABLET | Freq: Two times a day (BID) | ORAL | Status: DC
  Administered 2012-04-30 (×2): 25 mg via ORAL
  Filled 2012-04-30 (×4): qty 1

## 2012-04-30 MED ORDER — METOPROLOL TARTRATE 25 MG PO TABS: 25.0000 mg | ORAL_TABLET | Freq: Two times a day (BID) | ORAL | Status: DC

## 2012-04-30 MED ORDER — METOPROLOL TARTRATE 50 MG PO TABS: 25.0000 mg | ORAL_TABLET | Freq: Two times a day (BID) | ORAL | Status: DC

## 2012-04-30 NOTE — Progress Notes (Signed)
CARDIAC REHAB PHASE I   PRE:  Rate/Rhythm: 89 Afib  BP:  Supine:   Sitting: 131/75  Standing:    SaO2: 99 RA  MODE:  Ambulation: 550 ft   POST:  Rate/Rhythem: 164 Afib  BP:  Supine:   Sitting: 163/73  Standing:    SaO2: 96 RA 0935-1000 Assisted X 1 and used walker to ambulate. Gait steady with walker. No c/o with walking. Heart rate up to 164 with ambulation. Pt does c/o of SOB by end of walk. Pt back to recliner after walk with call light in ,reach. Reported HR to RN. Pt's HR decresed quickly with rest returned to low 100's.  Beatrix Fetters

## 2012-04-30 NOTE — Progress Notes (Signed)
                   [address]            [place] [address]          [phone]      5 Days Post-Op Procedure(s) (LRB): AORTIC VALVE REPLACEMENT (AVR) (N/A) REDO STERNOTOMY (N/A)  Subjective: Patient without complaints  Objective: Vital signs in last 24 hours: Patient Vitals for the past 24 hrs:  BP Temp Temp src Pulse Resp SpO2 Weight  04/30/12 0500 127/78 mmHg 98.4 F (36.9 C) Oral 83  18  98 % 260 lb (117.935 kg)  04/29/12 2200 130/92 mmHg 99.3 F (37.4 C) Oral 84  20  97 % -  04/29/12 1734 125/63 mmHg - - - - - -  04/29/12 1500 123/77 mmHg 98.4 F (36.9 C) - 80  18  96 % -  04/29/12 1001 126/78 mmHg - - 98  - - -   Pre op weight  118 kg Current Weight  04/30/12 260 lb (117.935 kg)      Intake/Output from previous day: 10/06 0701 - 10/07 0700 In: 480 [P.O.:480] Out: 1975 [Urine:1975]   Physical Exam:  Cardiovascular: IRRR, IRRR;no murmur Pulmonary: Diminished at bases bilaterally; no rales, wheezes, or rhonchi. Abdomen: Soft, non tender, bowel sounds present. Extremities: Bilateral lower extremity edema. Wound: Clean and dry.  No erythema or signs of infection.  Lab Results: CBC: Basename 04/28/12 0500  WBC 6.7  HGB 10.1*  HCT 31.4*  PLT 102*   BMET:  Basename 04/28/12 0500  NA 135  K 4.0  CL 99  CO2 27  GLUCOSE 196*  BUN 14  CREATININE 0.74  CALCIUM 8.8    PT/INR:  Lab Results  Component Value Date   INR 1.32 04/25/2012   INR 0.95 04/23/2012   INR 0.94 04/17/2012   ABG:  INR: Will add last result for INR, ABG once components are confirmed Will add last 4 CBG results once components are confirmed  Assessment/Plan:  1. CV - Afib.On Lopressor 12.5 bid, Lisinopril 10 daily, Digoxin 0.25 daily, Verapamil 80 tid.WIll increase Lopressor to 25 bid. 2.  Pulmonary - Encourage incentive spirometer 3. Volume Overload - Continue with diuresis 4.  Acute blood loss anemia - Last H and H 10.1 and 31.4 5.DM-CBGs 127/149/119.HGA1C  8.7.Continue Metformin and Insulin. 6.Remove EPW in am  ZIMMERMAN,DONIELLE MPA-C 04/30/2012,7:24 AM

## 2012-04-30 NOTE — Progress Notes (Signed)
Utilization Review Completed.[name] T10/01/2012   

## 2012-04-30 NOTE — Progress Notes (Signed)
Pt ambulated 900 ft with walker with standby assist. Tolerated well. Back to chair. Dion Saucier

## 2012-05-01 LAB — BASIC METABOLIC PANEL
CO2: 30 mEq/L (ref 19–32)
Chloride: 96 mEq/L (ref 96–112)
Creatinine, Ser: 0.83 mg/dL (ref 0.50–1.35)
Potassium: 4.1 mEq/L (ref 3.5–5.1)
Sodium: 137 mEq/L (ref 135–145)

## 2012-05-01 LAB — GLUCOSE, CAPILLARY
Glucose-Capillary: 125 mg/dL — ABNORMAL HIGH (ref 70–99)
Glucose-Capillary: 124 mg/dL — ABNORMAL HIGH (ref 70–99)

## 2012-05-01 MED ORDER — METOPROLOL TARTRATE 50 MG PO TABS: 50.0000 mg | ORAL_TABLET | Freq: Two times a day (BID) | ORAL | Status: DC

## 2012-05-01 MED ORDER — METOPROLOL TARTRATE 50 MG PO TABS
50.0000 mg | ORAL_TABLET | Freq: Two times a day (BID) | ORAL | Status: DC
  Administered 2012-05-01: 50 mg via ORAL
  Filled 2012-05-01 (×2): qty 1

## 2012-05-01 MED ORDER — TRAMADOL HCL 50 MG PO TABS: 50.0000 mg | ORAL_TABLET | Freq: Four times a day (QID) | ORAL | Status: DC | PRN

## 2012-05-01 MED ORDER — POTASSIUM CHLORIDE ER 10 MEQ PO TBCR: 20.0000 meq | EXTENDED_RELEASE_TABLET | Freq: Two times a day (BID) | ORAL | Status: DC

## 2012-05-01 NOTE — Progress Notes (Signed)
Pt discharge instructions and patient education complete. IV site d/c. Site WNL. NO s/s of distress. D/C home with family. [name]

## 2012-05-01 NOTE — Progress Notes (Addendum)
                   [address]            [place] [address]          [phone]      6 Days Post-Op Procedure(s) (LRB): AORTIC VALVE REPLACEMENT (AVR) (N/A) REDO STERNOTOMY (N/A)  Subjective: Patient without complaints, eating breakfast.  Objective: Vital signs in last 24 hours: Patient Vitals for the past 24 hrs:  BP Temp Temp src Pulse Resp SpO2 Weight  05/01/12 0648 - - - - - - 259 lb 4.2 oz (117.6 kg)  05/01/12 0553 129/66 mmHg 98.3 F (36.8 C) Oral 102  18  98 % -  04/30/12 2006 124/65 mmHg 98.4 F (36.9 C) Oral 81  18  96 % -  04/30/12 1326 111/67 mmHg 97.8 F (36.6 C) Oral 98  18  98 % -  04/30/12 0838 123/61 mmHg - - 100  - - -   Pre op weight  118 kg Current Weight  05/01/12 259 lb 4.2 oz (117.6 kg)      Intake/Output from previous day: 10/07 0701 - 10/08 0700 In: 720 [P.O.:720] Out: 2650 [Urine:2650]   Physical Exam:  Cardiovascular: IRRR, IRRR;no murmur Pulmonary: Diminished at bases bilaterally; no rales, wheezes, or rhonchi. Abdomen: Soft, non tender, bowel sounds present. Extremities: Bilateral lower extremity edema. Wound: Clean and dry.  No erythema or signs of infection.  Lab Results: CBC:No results found for this basename: WBC:2,HGB:2,HCT:2,PLT:2 in the last 72 hours BMET:   [facility] 05/01/12 0507  NA 137  K 4.1  CL 96  CO2 30  GLUCOSE 132*  BUN 23  CREATININE 0.83  CALCIUM 10.0    PT/INR:  Lab Results  Component Value Date   INR 1.32 04/25/2012   INR 0.95 04/23/2012   INR 0.94 04/17/2012   ABG:  INR: Will add last result for INR, ABG once components are confirmed Will add last 4 CBG results once components are confirmed  Assessment/Plan:  1. CV - Afib.On Lopressor 25 bid, Lisinopril 10 daily, Digoxin 0.25 daily, Verapamil 80 tid.Will increase Lopressor to 50 bid for better rate control. 2.  Pulmonary - Encourage incentive spirometer 3. Volume Overload - Continue with diuresis 4.  Acute blood loss anemia  - Last H and H 10.1 and 31.4 5.DM-CBGs 210/84/125.Marland KitchenHGA1C 8.7.Continue Metformin and Insulin. 6.Remove EPW  7.Possible discharge later today vs am if afib rate better controlled  ZIMMERMAN,DONIELLE MPA-C 05/01/2012,7:25 AM  Patient seen and examined. Agree with above

## 2012-05-01 NOTE — Discharge Summary (Signed)
Physician Discharge Summary  Patient ID: [name] MRN: [id number] DOB/AGE: September 14, 1945 66 y.o.  Admit date: 04/25/2012 Discharge date: 05/01/2012  Admission Diagnoses: 1.Severe aortic stenosis 2.History of CAD (s/p CABG x 3, 04') 3.History of afib 4.History of DM type 2 5.History of OSA 6.History of hypertension 7.History of COPD 8.History of GERD 9.History of IDA 10.History of CHF 11.History of thrombocytopenia 12.History of tobacco abuse  Discharge Diagnoses:  1.Severe aortic stenosis 2.History of CAD (s/p CABG x 3, 04') 3.History of afib 4.History of DM type 2 5.History of OSA 6.History of hypertension 7.History of COPD 8.History of GERD 9.History of IDA 10.History of CHF 11.History of thrombocytopenia 12.History of tobacco abuse  Procedure (s):  Redo median sternotomy, aortic valve replacement with 23-mm  [facility] Ease bovine pericardial valve (model number [id number], serial number [id number]) by Dr. Dorris Fetch on 04/25/2012.  History of Presenting Illness: This is a 66 year old gentleman who had coronary bypass grafting x3 in 2004. He has multiple medical problems including atrial fibrillation, congestive heart failure, cirrhosis, thrombocytopenia, arthritis, and frequent gastrointestinal bleeds. He had been having difficulty with shortness of breath with exertion which has been treated with paracentesis in [place] about a month ago. His shortness of breath improved significantly after that procedure was done, but did not resolve entirely. In mid August, he had an episode of chest pressure and went to the hospital. He was transferred to [facility] for further evaluation. He had cardiac catheterization which showed patent grafts and moderate three-vessel disease. He also had a TEE which showed normal LV function with ejection fraction of 65%, severe left ear with mild AI and mild MR. He also had an abdominal ultrasound which showed some evidence of cirrhosis, but no evidence  of portal hypertension and only trace ascites. He improved symptomatically with diuresis and was discharged.  He does have shortness of breath with exertion. He finds is very frustrating and is not able to engage in all the activities he would like to do. He is not currently having orthopnea, PND or peripheral edema. Dating back to January of 2012, he has had frequent gastrointestinal bleeding episodes. He required 4 units of blood for a bleed in January of that year. He then had 9- 10 hospitalizations since then for recurrent gastrointestinal bleeds. He's had extensive workup with scans and endoscopies with no site identified.Potential risks, benefits, and complications of a redo sternotomy, aortic valve replacement were discussed with the patient and he agreed to proceed. He underwent a redo sternotomy, AVR on 04/25/2012.  Brief Hospital Course:  He was extubated without difficulty the evening of surgery. He remained afebrile and hemodynamically stable.His Theone Murdoch, a line, chest tubes, and foley were all removed early in his post operative course.He did have thrombocytopenia post op (had a history prior to surgery). His last platelet count was up to 102,000. He then went into afib. He did have bradycardia and BB was held.He remained in afib and his rate was not well controlled.Over the next couple of days, however, his afib rate was better controlled with Lopressor, Digoxin, and Verapamil. He was not a coumadin candidate as has had prior GI bleeding.He was felt surgically stable for transfer from the ICU to [facility] for further convalescence on 04/28/2012. He has been tolerating a diet and has had a bowel movement. He has been ambulating well on room air. His epicardial pacing wires and chest tube sutures will be removed today. If his afib remains well controlled, may be discharged later today.  Latest Vital Signs: Blood pressure 129/66, pulse 102, temperature 98.3 F (36.8 C), temperature source Oral,  resp. rate 18, height 5\' 8"  (1.727 m), weight 259 lb 4.2 oz (117.6 kg), SpO2 98.00%.  Physical Exam: Cardiovascular: IRRR, IRRR;no murmur  Pulmonary: Diminished at bases bilaterally; no rales, wheezes, or rhonchi.  Abdomen: Soft, non tender, bowel sounds present.  Extremities: Bilateral lower extremity edema.  Wound: Clean and dry. No erythema or signs of infection.   Discharge Condition:Stable  Recent laboratory studies:  Lab Results  Component Value Date   WBC 6.7 04/28/2012   HGB 10.1* 04/28/2012   HCT 31.4* 04/28/2012   MCV 85.1 04/28/2012   PLT 102* 04/28/2012   Lab Results  Component Value Date   NA 137 05/01/2012   K 4.1 05/01/2012   CL 96 05/01/2012   CO2 30 05/01/2012   CREATININE 0.83 05/01/2012   GLUCOSE 132* 05/01/2012      Diagnostic Studies: Dg Chest 2 View  04/28/2012  *RADIOLOGY REPORT*  Clinical Data: Follow-up atelectasis.  CHEST - 2 VIEW  Comparison: 04/27/2012 and 04/26/2012.  Findings: Right IJ sheath has been removed in the interval.  Heart size and mediastinal contours are stable.  There is a small amount of pneumomediastinum.  Bibasilar atelectasis and small bilateral pleural effusions persist.  There is no pneumothorax.  IMPRESSION: Stable postoperative chest with mild bibasilar atelectasis and small bilateral pleural effusions.  No pneumothorax.   Original Report Authenticated By: Gerrianne Scale, M.D.    Dg Chest Portable 1 View In Am  04/27/2012  *RADIOLOGY REPORT*  Clinical Data: Post aortic valve replacement  PORTABLE CHEST - 1 VIEW  Comparison: 04/26/2012  Findings: The patient is status post median sternotomy and aortic valve replacement, CABG.  A Swan-Ganz introducer sheath remains in place but the catheter has been removed.  The mediastinal drain has been removed as well.  Cardiomegaly is stable in degree.   The mediastinal contour is unchanged with interval decrease in left upper lobe paramediastinal atelectasis/postsurgical density.  Some persistent  linear atelectasis is identified at both lung bases.  Pulmonary vascular congestion without overt congestive failure or new focal infiltrate is seen.  No pleural fluid is noted.  IMPRESSION: Persistent bibasilar volume loss and cardiomegaly. Improved aeration of the left upper lobe.  Otherwise stable.  Support apparatus as noted above   Original Report Authenticated By: Bertha Stakes, M.D.     Discharge Orders    Future Appointments: Provider: Department: Dept Phone: Center:   05/22/2012 8:00 AM Radene Gunning [facility] [phone] None   05/22/2012 11:00 AM Loreli Slot, MD [facility] [phone] [facility]   07/10/2012 11:00 AM Malissa Hippo, MD [facility] [phone] None   08/21/2012 8:45 AM Dava Najjar Idelle Jo [facility] [phone] None   08/21/2012 9:15 AM Myrtis Ser, NP [facility] [phone] None      Discharge Medications:   Medication List     As of 05/01/2012  8:34 AM    STOP taking these medications         isosorbide mononitrate 30 MG 24 hr tablet   Commonly known as: IMDUR      nitroGLYCERIN 0.4 MG SL tablet   Commonly known as: NITROSTAT      TAKE these medications         acetaminophen 325 MG tablet   Commonly known as: TYLENOL   Take 650 mg by mouth daily.      aspirin 81 MG EC tablet  Take 1 tablet (81 mg total) by mouth daily.      atorvastatin 20 MG tablet   Commonly known as: LIPITOR   Take 20 mg by mouth at bedtime.      digoxin 0.25 MG tablet   Commonly known as: LANOXIN   Take 1 tablet (250 mcg total) by mouth daily.      ferrous gluconate 325 MG tablet   Commonly known as: FERGON   Take 325 mg by mouth 3 (three) times daily with meals.      Fish Oil 1200 MG Caps   Take 1,200 mg by mouth 2 (two) times daily.      furosemide 80 MG tablet   Commonly known as: LASIX   Take 80 mg by mouth 2 (two) times daily.      insulin detemir 100 UNIT/ML injection   Commonly known as: LEVEMIR    Inject 50 Units into the skin daily.      insulin glargine 100 UNIT/ML injection   Commonly known as: LANTUS   Inject 15 Units into the skin at bedtime.      lisinopril 10 MG tablet   Commonly known as: PRINIVIL,ZESTRIL   Take 1 tablet (10 mg total) by mouth daily.      metFORMIN 1000 MG tablet   Commonly known as: GLUCOPHAGE   Take 1,000 mg by mouth 2 (two) times daily with a meal.      metoprolol 50 MG tablet   Commonly known as: LOPRESSOR   Take 1 tablet (50 mg total) by mouth 2 (two) times daily.      multivitamin tablet   Take 1 tablet by mouth daily.      omeprazole 20 MG capsule   Commonly known as: PRILOSEC   Take 40 mg by mouth daily.      potassium chloride 10 MEQ tablet   Commonly known as: K-DUR   Take 2 tablets (20 mEq total) by mouth 2 (two) times daily.      traMADol 50 MG tablet   Commonly known as: ULTRAM   Take 1 tablet (50 mg total) by mouth every 6 (six) hours as needed for pain.      verapamil 80 MG tablet   Commonly known as: CALAN   Take 80 mg by mouth 3 (three) times daily.      vitamin C 1000 MG tablet   Take 1,000 mg by mouth 2 (two) times daily.        Follow Up Appointments:     Follow-up Information    Schedule an appointment as soon as possible for a visit with Tonny Bollman, MD. (for 2 weeks)    Contact information:   [address] [place] [address] [phone]       Follow up with [name], MD. (Call for further diabetes management and surveillance of HGA1C 8.7)    Contact information:   [address] [place] [address] [phone]       Follow up with Loreli Slot, MD. (PA/LAT CXR to be taken on (at [facility] which is in the same building as Dr. Sunday Corn office)05/22/2012 at 10:00 am;Appointement with Dr. Dorris Fetch is on 05/22/2012 at 11:00 am)    Contact information:   [address] [place] [address] [phone]           Signed: Doree Fudge MPA-C 05/01/2012, 8:34 AM

## 2012-05-01 NOTE — Progress Notes (Signed)
EPW d/c per protocol. All wires intact upon removal. Vital signs remained stable. Bed rest until 1150. Call bell in reach. CT sutures d/c per MD order. Steri strips applied. Will continue to monitor. [name]

## 2012-05-01 NOTE — Progress Notes (Signed)
CARDIAC REHAB PHASE I   PRE:  Rate/Rhythm: 90afib  BP:  Supine:   Sitting: 114/66  Standing:    SaO2: 100%RA  MODE:  Ambulation: 700 ft   POST:  Rate/Rhythem: 115afib  BP:  Supine: 126/66  Sitting:   Standing:    SaO2: 98%RA 1020-1100 Pt walked 700 ft with steady gait. Tolerated well with heart rate to 115. To bed after walk for pacing wire removal. Education completed with pt and wife. Permission given to refer to [facility] Phase 2. Encouraged counting carbs and gave diabetic diet.  Duanne Limerick

## 2012-05-14 ENCOUNTER — Ambulatory Visit (INDEPENDENT_AMBULATORY_CARE_PROVIDER_SITE_OTHER): Payer: Self-pay | Admitting: Physician Assistant

## 2012-05-14 ENCOUNTER — Encounter: Payer: Self-pay | Admitting: Physician Assistant

## 2012-05-14 VITALS — BP 129/66 | HR 72 | Wt 260.0 lb

## 2012-05-14 DIAGNOSIS — I2510 Atherosclerotic heart disease of native coronary artery without angina pectoris: Secondary | ICD-10-CM

## 2012-05-14 DIAGNOSIS — I5032 Chronic diastolic (congestive) heart failure: Secondary | ICD-10-CM

## 2012-05-14 DIAGNOSIS — I350 Nonrheumatic aortic (valve) stenosis: Secondary | ICD-10-CM

## 2012-05-14 DIAGNOSIS — I359 Nonrheumatic aortic valve disorder, unspecified: Secondary | ICD-10-CM

## 2012-05-14 DIAGNOSIS — I4891 Unspecified atrial fibrillation: Secondary | ICD-10-CM

## 2012-05-14 LAB — BASIC METABOLIC PANEL
CO2: 27 mEq/L (ref 19–32)
Chloride: 98 mEq/L (ref 96–112)
Potassium: 4.4 mEq/L (ref 3.5–5.1)
Sodium: 135 mEq/L (ref 135–145)

## 2012-05-14 NOTE — Progress Notes (Signed)
[address], [address] [place], [place]  [address] Phone: [phone], Fax:  [phone]  Date:  05/14/2012   Name:  [name]   DOB:  1945/11/19   MRN:  [id number]  PCP:  [name], MD  Primary Cardiologist:  Dr. Tonny Bollman (previously Dr. Andee Lineman in [place]) Primary Electrophysiologist:  None    History of Present Illness: [name] is a 66 y.o. male who returns for follow up after recent hospitalization.  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 He is not on Coumadin secondary to history GI bleeding. He has undergone paracentesis in the past for removal of ascitic fluid. He was recently noted to have worsening aortic stenosis. Echocardiogram 8/13: Vigorous LV function, EF 65-70%, mean gradient across the aortic valve 45 mm mercury consistent with severe aortic stenosis, mild MR. LHC 8/13: Free radial to obtuse marginal patent, SVG-diagonal patent, LIMA-LAD patent, EF 65-70%, mean aortic valve gradient 42. He was admitted 10/2-10/8 after undergoing aortic valve replacement with a 23 mm Edwards magna he bovine pericardial tissue valve by Dr. Dorris Fetch. His postoperative course was fairly uneventful. His platelet counts remained stable. He had uncontrolled rates with atrial fibrillation and required Lopressor, digoxin and verapamil. As noted previously, he is not a Coumadin candidate. Since coming home, he is doing well. His breathing is still somewhat short. He has some chest soreness. No fevers, chills or cough. He sleeps an incline for comfort. He denies PND. He denies LE edema. He denies syncope.  Labs (9/13):     TSH 2.92 Labs (10/13):   K 4.1, creatinine 0.3, Hgb 10.1, platelet count 102K,    Wt Readings from Last 3 Encounters:  05/14/12 260 lb (117.935 kg)  05/01/12 259 lb  4.2 oz (117.6 kg)  05/01/12 259 lb 4.2 oz (117.6 kg)     Past Medical History  Diagnosis Date  . Overweight   . CAD (coronary artery disease)     a. s/p CABG 2004;  b. LHC 10/13:  LHC 8/13: Free radial to obtuse marginal patent, SVG-diagonal patent, LIMA-LAD patent, EF 65-70%, mean aortic valve gradient 42  . Atrial fibrillation     Permanent; off of Coumadin for now due to GI bleed  . DM2 (diabetes mellitus, type 2)   . Insomnia   . Carotid bruit 06/14/2011    a. pre-AVR dopplers 10/13: no sig ICA stenosis  . Hypertension   . Chronic diastolic heart failure   . GERD (gastroesophageal reflux disease)   . COPD (chronic obstructive pulmonary disease)   . Iron deficiency anemia     Requiring intravenous iron  . H/O hiatal hernia   . AVM (arteriovenous malformation)     Recurrent GI bleeding requiring multiple transfusions  . Hyperlipidemia   . Thrombocytopenia   . Ascites     status post paracentesis with removal of 3.4 L of ascitic fluid  . Osteoarthritis   . Mediastinal adenopathy 09/22/2011  . Cirrhosis   . Iron deficiency anemia   . Mediastinal adenopathy 09/22/2011  . Aortic stenosis 03/08/2012    s/p tissue AVR 10/13 with Dr. Dorris Fetch  . OSA (obstructive sleep apnea) 1999    DOES NOT USE CPAP    Current Outpatient Prescriptions  Medication Sig Dispense Refill  . acetaminophen (TYLENOL) 325 MG tablet Take 650 mg by mouth daily.      [name]  Ascorbic Acid (VITAMIN C) 1000 MG tablet Take 1,000 mg by mouth 2 (two) times daily.      [name] aspirin EC 81 MG EC tablet Take 1 tablet (81 mg total) by mouth daily.      [name] atorvastatin (LIPITOR) 20 MG tablet Take 20 mg by mouth at bedtime.       . digoxin (LANOXIN) 0.25 MG tablet Take 1 tablet (250 mcg total) by mouth daily.  90 tablet  3  . ferrous gluconate (FERGON) 325 MG tablet Take 325 mg by mouth 3 (three) times daily with meals.      . furosemide (LASIX) 80 MG tablet Take 80 mg by mouth 2 (two) times daily.      . insulin detemir  (LEVEMIR) 100 UNIT/ML injection Inject 50 Units into the skin daily.  10 mL  1  . insulin glargine (LANTUS) 100 UNIT/ML injection Inject 15 Units into the skin at bedtime.  10 mL  12  . lisinopril (PRINIVIL,ZESTRIL) 10 MG tablet Take 1 tablet (10 mg total) by mouth daily.  90 tablet  3  . metFORMIN (GLUCOPHAGE) 1000 MG tablet Take 1,000 mg by mouth 2 (two) times daily with a meal.       . metoprolol tartrate (LOPRESSOR) 50 MG tablet Take 1 tablet (50 mg total) by mouth 2 (two) times daily.  60 tablet  1  . Multiple Vitamin (MULTIVITAMIN) tablet Take 1 tablet by mouth daily.       . Omega-3 Fatty Acids (FISH OIL) 1200 MG CAPS Take 1,200 mg by mouth 2 (two) times daily.       [name] omeprazole (PRILOSEC) 20 MG capsule Take 40 mg by mouth daily.       . potassium chloride (K-DUR) 10 MEQ tablet 3 tabs am and 2 tabs pm      . traMADol (ULTRAM) 50 MG tablet Take 1 tablet (50 mg total) by mouth every 6 (six) hours as needed for pain.  30 tablet  0  . verapamil (CALAN) 80 MG tablet Take 80 mg by mouth 3 (three) times daily.        Allergies: Allergies  Allergen Reactions  . Codeine Other (See Comments)    Hurting in chest  . Diltiazem Hcl Itching  . Niacin Other (See Comments)    Hot flashes    Social History:   reports that he quit smoking about 11 years ago. His smoking use included Cigarettes. He has a 30 pack-year smoking history. He has never used smokeless tobacco. He reports that he drinks about .6 ounces of alcohol per week. He reports that he does not use illicit drugs.   ROS:  Please see the history of present illness.   No fevers, chills, cough, melena, hematochezia.   All other systems reviewed and negative.   PHYSICAL EXAM: VS:  BP 129/66  Pulse 72  Wt 260 lb (117.935 kg) Well nourished, well developed, in no acute distress HEENT: normal Neck: no JVD at 90 Cardiac:  normal S1, S2; irregularly irregular rhythm; 2/6 systolic murmur heard best at the RUSB and LLSB Chest: Well-healed  median sternotomy scar without erythema or discharge Lungs:  Decreased breath sounds bilaterally, no wheezing, rhonchi or rales Abd: soft, nontender, no hepatomegaly Ext: no edema Skin: warm and dry Neuro:  CNs 2-12 intact, no focal abnormalities noted  EKG:  Atrial fibrillation, HR 72, left axis deviation, nonspecific ST-T wave changes, no change from prior tracings      ASSESSMENT AND PLAN:  1. Aortic Stenosis:   He is doing well status post tissue AVR. He does have some dyspnea with exertion. I suspect that this is overall deconditioning. I have encouraged him to continue to increase his activity slowly. I will refer him to cardiac rehabilitation at [facility]. I will arrange a followup echocardiogram for baseline measurements. He will keep his followup with Dr. Excell Seltzer in one month.  2. Coronary Artery Disease:   Overall stable. Recent catheterization demonstrated patent bypass grafts. He continues on aspirin and statin therapy.  3. Atrial Fibrillation:   Rate control. He is not a Coumadin candidate secondary to history of GI bleeding.  4. Hypertension:   Controlled.  5. Chronic Diastolic CHF:   Volume stable. Check a basic metabolic panel today to followup on renal function and potassium. Otherwise continue current therapy.  6. Hyperlipidemia:   Continue statin therapy.  7. Anemia:   No symptoms of recurrent bleeding noted.  8. Cirrhosis:   Managed by gastroenterology.  Signed, Tereso Newcomer, PA-C  12:56 PM 05/14/2012

## 2012-05-14 NOTE — Patient Instructions (Addendum)
You have been referred to CARDIAC REHAB @ [facility]  Your physician has requested that you have an echocardiogram DX AORTIC STENOSIS. Echocardiography is a painless test that uses sound waves to create images of your heart. It provides your doctor with information about the size and shape of your heart and how well your heart's chambers and valves are working. This procedure takes approximately one hour. There are no restrictions for this procedure.   Your physician recommends that you return for lab work in: TODAY BMET  KEEP FOLLOW UP APPT WITH DR. Excell Seltzer 06/12/12

## 2012-05-15 ENCOUNTER — Telehealth: Payer: Self-pay | Admitting: *Deleted

## 2012-05-15 ENCOUNTER — Ambulatory Visit (INDEPENDENT_AMBULATORY_CARE_PROVIDER_SITE_OTHER): Payer: Managed Care, Other (non HMO) | Admitting: Internal Medicine

## 2012-05-15 NOTE — Telephone Encounter (Signed)
Message copied by Tarri Fuller on Tue May 15, 2012 11:05 AM ------      Message from: [place], [place] T      Created: Mon May 14, 2012  6:01 PM       Please notify patient that the lab results are ok.      Tereso Newcomer, PA-C  6:01 PM 05/14/2012

## 2012-05-15 NOTE — Telephone Encounter (Signed)
pt notified about lab results with verbal understanding today.  

## 2012-05-17 ENCOUNTER — Other Ambulatory Visit: Payer: Self-pay | Admitting: Thoracic Surgery (Cardiothoracic Vascular Surgery)

## 2012-05-17 DIAGNOSIS — Z951 Presence of aortocoronary bypass graft: Secondary | ICD-10-CM

## 2012-05-17 DIAGNOSIS — Z952 Presence of prosthetic heart valve: Secondary | ICD-10-CM

## 2012-05-18 ENCOUNTER — Ambulatory Visit (HOSPITAL_COMMUNITY): Payer: Managed Care, Other (non HMO) | Attending: Internal Medicine

## 2012-05-18 DIAGNOSIS — K7460 Unspecified cirrhosis of liver: Secondary | ICD-10-CM | POA: Insufficient documentation

## 2012-05-18 DIAGNOSIS — J4489 Other specified chronic obstructive pulmonary disease: Secondary | ICD-10-CM | POA: Insufficient documentation

## 2012-05-18 DIAGNOSIS — E785 Hyperlipidemia, unspecified: Secondary | ICD-10-CM | POA: Insufficient documentation

## 2012-05-18 DIAGNOSIS — I5032 Chronic diastolic (congestive) heart failure: Secondary | ICD-10-CM

## 2012-05-18 DIAGNOSIS — I350 Nonrheumatic aortic (valve) stenosis: Secondary | ICD-10-CM

## 2012-05-18 DIAGNOSIS — E119 Type 2 diabetes mellitus without complications: Secondary | ICD-10-CM | POA: Insufficient documentation

## 2012-05-18 DIAGNOSIS — I517 Cardiomegaly: Secondary | ICD-10-CM | POA: Insufficient documentation

## 2012-05-18 DIAGNOSIS — Z954 Presence of other heart-valve replacement: Secondary | ICD-10-CM | POA: Insufficient documentation

## 2012-05-18 DIAGNOSIS — I509 Heart failure, unspecified: Secondary | ICD-10-CM | POA: Insufficient documentation

## 2012-05-18 DIAGNOSIS — I059 Rheumatic mitral valve disease, unspecified: Secondary | ICD-10-CM | POA: Insufficient documentation

## 2012-05-18 DIAGNOSIS — I2510 Atherosclerotic heart disease of native coronary artery without angina pectoris: Secondary | ICD-10-CM | POA: Insufficient documentation

## 2012-05-18 DIAGNOSIS — K219 Gastro-esophageal reflux disease without esophagitis: Secondary | ICD-10-CM | POA: Insufficient documentation

## 2012-05-18 DIAGNOSIS — I359 Nonrheumatic aortic valve disorder, unspecified: Secondary | ICD-10-CM | POA: Insufficient documentation

## 2012-05-18 DIAGNOSIS — I10 Essential (primary) hypertension: Secondary | ICD-10-CM | POA: Insufficient documentation

## 2012-05-18 DIAGNOSIS — I4891 Unspecified atrial fibrillation: Secondary | ICD-10-CM | POA: Insufficient documentation

## 2012-05-18 DIAGNOSIS — J449 Chronic obstructive pulmonary disease, unspecified: Secondary | ICD-10-CM | POA: Insufficient documentation

## 2012-05-18 NOTE — Progress Notes (Signed)
Echocardiogram performed.  

## 2012-05-21 ENCOUNTER — Telehealth: Payer: Self-pay | Admitting: *Deleted

## 2012-05-21 ENCOUNTER — Encounter: Payer: Self-pay | Admitting: Physician Assistant

## 2012-05-21 NOTE — Telephone Encounter (Signed)
pt's wife notified about echo results today and verbalized understanding. She asked if I would fax echo results to pt's surgeon Dr. Dorris Fetch, I told her that would be no problem.

## 2012-05-21 NOTE — Telephone Encounter (Signed)
Message copied by Tarri Fuller on Mon May 21, 2012 12:22 PM ------      Message from: [place], [place] T      Created: Mon May 21, 2012  8:33 AM       Normal LV function.      AVR ok.      Tereso Newcomer, PA-C  8:33 AM 05/21/2012

## 2012-05-22 ENCOUNTER — Telehealth: Payer: Self-pay | Admitting: *Deleted

## 2012-05-22 ENCOUNTER — Other Ambulatory Visit (HOSPITAL_BASED_OUTPATIENT_CLINIC_OR_DEPARTMENT_OTHER): Payer: Managed Care, Other (non HMO) | Admitting: Lab

## 2012-05-22 ENCOUNTER — Ambulatory Visit
Admission: RE | Admit: 2012-05-22 | Discharge: 2012-05-22 | Disposition: A | Discharge status: Home / Self Care | Payer: Self-pay | Source: Ambulatory Visit | Attending: Thoracic Surgery (Cardiothoracic Vascular Surgery) | Admitting: Thoracic Surgery (Cardiothoracic Vascular Surgery)

## 2012-05-22 ENCOUNTER — Encounter: Payer: Self-pay | Admitting: Thoracic Surgery (Cardiothoracic Vascular Surgery)

## 2012-05-22 ENCOUNTER — Ambulatory Visit (INDEPENDENT_AMBULATORY_CARE_PROVIDER_SITE_OTHER): Payer: Self-pay | Admitting: Thoracic Surgery (Cardiothoracic Vascular Surgery)

## 2012-05-22 VITALS — BP 124/70 | HR 72 | Resp 16 | Ht 68.0 in | Wt 264.0 lb

## 2012-05-22 DIAGNOSIS — D649 Anemia, unspecified: Secondary | ICD-10-CM

## 2012-05-22 DIAGNOSIS — D696 Thrombocytopenia, unspecified: Secondary | ICD-10-CM

## 2012-05-22 DIAGNOSIS — I350 Nonrheumatic aortic (valve) stenosis: Secondary | ICD-10-CM

## 2012-05-22 DIAGNOSIS — Z952 Presence of prosthetic heart valve: Secondary | ICD-10-CM

## 2012-05-22 DIAGNOSIS — Z951 Presence of aortocoronary bypass graft: Secondary | ICD-10-CM

## 2012-05-22 DIAGNOSIS — R590 Localized enlarged lymph nodes: Secondary | ICD-10-CM

## 2012-05-22 DIAGNOSIS — Z954 Presence of other heart-valve replacement: Secondary | ICD-10-CM

## 2012-05-22 DIAGNOSIS — R599 Enlarged lymph nodes, unspecified: Secondary | ICD-10-CM

## 2012-05-22 DIAGNOSIS — D509 Iron deficiency anemia, unspecified: Secondary | ICD-10-CM

## 2012-05-22 DIAGNOSIS — I359 Nonrheumatic aortic valve disorder, unspecified: Secondary | ICD-10-CM

## 2012-05-22 LAB — CBC WITH DIFFERENTIAL/PLATELET
Basophils Absolute: 0.1 10*3/uL (ref 0.0–0.1)
Eosinophils Absolute: 0.2 10*3/uL (ref 0.0–0.5)
HCT: 33.8 % — ABNORMAL LOW (ref 38.4–49.9)
HGB: 11 g/dL — ABNORMAL LOW (ref 13.0–17.1)
LYMPH%: 17.5 % (ref 14.0–49.0)
MCV: 82.9 fL (ref 79.3–98.0)
MONO#: 0.6 10*3/uL (ref 0.1–0.9)
MONO%: 8.7 % (ref 0.0–14.0)
NEUT#: 4.4 10*3/uL (ref 1.5–6.5)
Platelets: 137 10*3/uL — ABNORMAL LOW (ref 140–400)
WBC: 6.3 10*3/uL (ref 4.0–10.3)

## 2012-05-22 LAB — IRON AND TIBC: %SAT: 31 % (ref 20–55)

## 2012-05-22 LAB — FERRITIN: Ferritin: 37 ng/mL (ref 22–322)

## 2012-05-22 NOTE — Progress Notes (Signed)
HPI:  Mr. Fedie returns today for a scheduled followup after having a redo sternotomy for aortic valve replacement on 04/25/12. He did extremely well postoperatively, in fact better than expected given his comorbidities. He saw Tereso Newcomer last week and was complaining of some shortness of breath. Echocardiogram showed good function of the prosthetic valve with no paravalvular leak and preserved left ventricular systolic function, although he did have some LVH and probably some diastolic dysfunction. He still having some incisional discomfort and is occasionally taking Ultram and no more than once or twice a day. Usually takes it at night before he goes to bed. Overall he feels well. He's not had any problems with ascites or peripheral edema since his procedure. He is anxious to resume activities.  Past Medical History  Diagnosis Date  . Overweight   . CAD (coronary artery disease)     a. s/p CABG 2004;  b. LHC 10/13:  LHC 8/13: Free radial to obtuse marginal patent, SVG-diagonal patent, LIMA-LAD patent, EF 65-70%, mean aortic valve gradient 42  . Atrial fibrillation     Permanent; off of Coumadin for now due to GI bleed  . DM2 (diabetes mellitus, type 2)   . Insomnia   . Carotid bruit 06/14/2011    a. pre-AVR dopplers 10/13: no sig ICA stenosis  . Hypertension   . Chronic diastolic heart failure   . GERD (gastroesophageal reflux disease)   . COPD (chronic obstructive pulmonary disease)   . Iron deficiency anemia     Requiring intravenous iron  . H/O hiatal hernia   . AVM (arteriovenous malformation)     Recurrent GI bleeding requiring multiple transfusions  . Hyperlipidemia   . Thrombocytopenia   . Ascites     status post paracentesis with removal of 3.4 L of ascitic fluid  . Osteoarthritis   . Mediastinal adenopathy 09/22/2011  . Cirrhosis   . Iron deficiency anemia   . Mediastinal adenopathy 09/22/2011  . Aortic stenosis 03/08/2012    a.  s/p tissue AVR 10/13 with Dr.  Dorris Fetch;   b. Echo 10/13: mod LVH, EF 55-60%, tissue AVR not well seen, no leak, gradient not too high (mean ), MAC, mild MR, mild LAE, PASP 38  . OSA (obstructive sleep apnea) 1999    DOES NOT USE CPAP     Current Outpatient Prescriptions  Medication Sig Dispense Refill  . acetaminophen (TYLENOL) 325 MG tablet Take 650 mg by mouth daily.      . Ascorbic Acid (VITAMIN C) 1000 MG tablet Take 1,000 mg by mouth 2 (two) times daily.      [name] aspirin EC 81 MG EC tablet Take 1 tablet (81 mg total) by mouth daily.      [name] atorvastatin (LIPITOR) 20 MG tablet Take 20 mg by mouth at bedtime.       . digoxin (LANOXIN) 0.25 MG tablet Take 1 tablet (250 mcg total) by mouth daily.  90 tablet  3  . ferrous gluconate (FERGON) 325 MG tablet Take 325 mg by mouth 3 (three) times daily with meals.      . furosemide (LASIX) 80 MG tablet Take 80 mg by mouth 2 (two) times daily.      . insulin detemir (LEVEMIR) 100 UNIT/ML injection Inject 50 Units into the skin daily.  10 mL  1  . insulin glargine (LANTUS) 100 UNIT/ML injection Inject 15 Units into the skin at bedtime.  10 mL  12  . lisinopril (PRINIVIL,ZESTRIL) 10 MG tablet Take 1  tablet (10 mg total) by mouth daily.  90 tablet  3  . metFORMIN (GLUCOPHAGE) 1000 MG tablet Take 1,000 mg by mouth 2 (two) times daily with a meal.       . metoprolol tartrate (LOPRESSOR) 50 MG tablet Take 1 tablet (50 mg total) by mouth 2 (two) times daily.  60 tablet  1  . Multiple Vitamin (MULTIVITAMIN) tablet Take 1 tablet by mouth daily.       . Omega-3 Fatty Acids (FISH OIL) 1200 MG CAPS Take 1,200 mg by mouth 2 (two) times daily.       [name] omeprazole (PRILOSEC) 20 MG capsule Take 40 mg by mouth daily.       . potassium chloride (K-DUR) 10 MEQ tablet 3 tabs am and 2 tabs pm      . traMADol (ULTRAM) 50 MG tablet Take 1 tablet (50 mg total) by mouth every 6 (six) hours as needed for pain.  30 tablet  0  . verapamil (CALAN) 80 MG tablet Take 80 mg by mouth 3 (three) times  daily.        Physical Exam BP 124/70  Pulse 72  Resp 16  Ht 5\' 8"  (1.727 m)  Wt 264 lb (119.75 kg)  BMI 40.14 kg/m2  SpO28 41% 66 year old male in no acute distress Neurologic alert and oriented x3 with no focal deficits Lungs clear with equal breath sounds bilaterally, no rales or wheezes Cardiac irregular rate and rhythm, 2/6 systolic murmur Sternum stable Incisions healing well  Diagnostic Tests: Chest x-ray shows a tiny right pleural effusion  Impression: 66 year old male with multiple medical problems who underwent a redo sternotomy for aortic valve replacement on 05/22/2012. He has done as well as could be expected. I did advise him that this is a 3-6 month recovered process and he is still in the very early stages. I encouraged him to continue to gradually increase his activities. He should not lift anything over 10 pounds or engage in any heavy work with his arms for at least another 3 weeks. He may begin driving a limited basis, appropriate cautions were discussed. He does understand not to drive while taking the pain medication.  Plan: Continue to follow with Dr. Tonny Bollman.  I will be happy to see him back any time the future if I can be of any further assistance with his care.

## 2012-05-22 NOTE — Telephone Encounter (Signed)
Called pt and informed him of Hbg stable per Dr. Gaylyn Rong and Platelets still a little low due to cirrhosis, but improved from last lab 3 weeks ago.  Continue to take oral iron and keep next appt w/ [place] in January as scheduled. Pt verbalized understanding.

## 2012-05-22 NOTE — Telephone Encounter (Signed)
Message copied by Wende Mott on Tue May 22, 2012 11:39 AM ------      Message from: Jethro Bolus T      Created: Tue May 22, 2012  9:59 AM       Please call pt.  His Hgb is stable.  Continue oral iron as tolerated.  Thrombocytopenia is due to cirrhosis; this has slightly improved.  Continue with previously arranged lab appointments.  Thanks.

## 2012-05-29 ENCOUNTER — Other Ambulatory Visit: Payer: Self-pay | Admitting: Cardiovascular Disease

## 2012-06-06 ENCOUNTER — Other Ambulatory Visit: Payer: Self-pay | Admitting: *Deleted

## 2012-06-06 MED ORDER — FUROSEMIDE 80 MG PO TABS: 80.0000 mg | ORAL_TABLET | Freq: Two times a day (BID) | ORAL | Status: DC

## 2012-06-12 ENCOUNTER — Ambulatory Visit (INDEPENDENT_AMBULATORY_CARE_PROVIDER_SITE_OTHER): Payer: Managed Care, Other (non HMO) | Admitting: Cardiovascular Disease

## 2012-06-12 VITALS — BP 163/70 | HR 60 | Ht 68.0 in | Wt 262.0 lb

## 2012-06-12 DIAGNOSIS — I359 Nonrheumatic aortic valve disorder, unspecified: Secondary | ICD-10-CM

## 2012-06-12 DIAGNOSIS — I10 Essential (primary) hypertension: Secondary | ICD-10-CM

## 2012-06-12 MED ORDER — FUROSEMIDE 80 MG PO TABS: 80.0000 mg | ORAL_TABLET | Freq: Every day | ORAL | Status: DC

## 2012-06-12 NOTE — Patient Instructions (Addendum)
Your physician recommends that you schedule a follow-up appointment in: 2 months with Dr. Excell Seltzer.  Decrease Lasix dose to 80mg  once daily.  Check your blood pressure 2-3 times per week and keep a journal.  Bring those reading with you to your next appt with Dr. Excell Seltzer.

## 2012-06-21 ENCOUNTER — Encounter: Payer: Self-pay | Admitting: Cardiovascular Disease

## 2012-06-21 NOTE — Progress Notes (Signed)
HPI:   66 year old gentleman presenting for followup evaluation. He has a complex cardiac history with permanent atrial fibrillation, remote CABG, and diastolic heart failure. He developed severe aortic stenosis and underwent redo aortic valve replacement in October of this year. He's not been on Coumadin because of GI bleeding. He has multiple comorbidities which include hepatic cirrhosis, type 2 diabetes, chronic thrombocytopenia, and hypertension.  He continues to make slow improvement from surgery. He has mild pain in his sternal area but this is improving. He denies shortness of breath, orthopnea, or PND. He's had mild leg swelling. Overall he feels much better and is happy with his progress.  Outpatient Encounter Prescriptions as of 06/12/2012  Medication Sig Dispense Refill  . acetaminophen (TYLENOL) 325 MG tablet Take 650 mg by mouth daily.      . Ascorbic Acid (VITAMIN C) 1000 MG tablet Take 1,000 mg by mouth 2 (two) times daily.      [name] aspirin EC 81 MG EC tablet Take 1 tablet (81 mg total) by mouth daily.      [name] atorvastatin (LIPITOR) 20 MG tablet Take 20 mg by mouth at bedtime.       . digoxin (LANOXIN) 0.25 MG tablet TAKE 1 TABLET BY MOUTH EVERY DAY  90 tablet  1  . ferrous gluconate (FERGON) 325 MG tablet Take 325 mg by mouth 3 (three) times daily with meals.      . furosemide (LASIX) 80 MG tablet Take 1 tablet (80 mg total) by mouth daily.  60 tablet  6  . insulin detemir (LEVEMIR) 100 UNIT/ML injection Inject 50 Units into the skin daily.  10 mL  1  . insulin glargine (LANTUS) 100 UNIT/ML injection Inject 15 Units into the skin at bedtime.  10 mL  12  . lisinopril (PRINIVIL,ZESTRIL) 10 MG tablet Take 1 tablet (10 mg total) by mouth daily.  90 tablet  3  . metFORMIN (GLUCOPHAGE) 1000 MG tablet Take 1,000 mg by mouth 2 (two) times daily with a meal.       . metoprolol tartrate (LOPRESSOR) 50 MG tablet Take 1 tablet (50 mg total) by mouth 2 (two) times daily.  60 tablet  1  .  Multiple Vitamin (MULTIVITAMIN) tablet Take 1 tablet by mouth daily.       . Omega-3 Fatty Acids (FISH OIL) 1200 MG CAPS Take 1,200 mg by mouth 2 (two) times daily.       [name] omeprazole (PRILOSEC) 20 MG capsule Take 40 mg by mouth daily.       . potassium chloride (K-DUR) 10 MEQ tablet 3 tabs am and 2 tabs pm      . traMADol (ULTRAM) 50 MG tablet Take 1 tablet (50 mg total) by mouth every 6 (six) hours as needed for pain.  30 tablet  0  . verapamil (CALAN) 80 MG tablet Take 80 mg by mouth 3 (three) times daily.      . [DISCONTINUED] furosemide (LASIX) 80 MG tablet Take 1 tablet (80 mg total) by mouth 2 (two) times daily.  60 tablet  6    Allergies  Allergen Reactions  . Codeine Other (See Comments)    Hurting in chest  . Diltiazem Hcl Itching  . Niacin Other (See Comments)    Hot flashes    Past Medical History  Diagnosis Date  . Overweight   . CAD (coronary artery disease)     a. s/p CABG 2004;  b. LHC 10/13:  LHC 8/13: Free radial to  obtuse marginal patent, SVG-diagonal patent, LIMA-LAD patent, EF 65-70%, mean aortic valve gradient 42  . Atrial fibrillation     Permanent; off of Coumadin for now due to GI bleed  . DM2 (diabetes mellitus, type 2)   . Insomnia   . Carotid bruit 06/14/2011    a. pre-AVR dopplers 10/13: no sig ICA stenosis  . Hypertension   . Chronic diastolic heart failure   . GERD (gastroesophageal reflux disease)   . COPD (chronic obstructive pulmonary disease)   . Iron deficiency anemia     Requiring intravenous iron  . H/O hiatal hernia   . AVM (arteriovenous malformation)     Recurrent GI bleeding requiring multiple transfusions  . Hyperlipidemia   . Thrombocytopenia   . Ascites     status post paracentesis with removal of 3.4 L of ascitic fluid  . Osteoarthritis   . Mediastinal adenopathy 09/22/2011  . Cirrhosis   . Iron deficiency anemia   . Mediastinal adenopathy 09/22/2011  . Aortic stenosis 03/08/2012    a.  s/p tissue AVR 10/13 with Dr.  Dorris Fetch;   b. Echo 10/13: mod LVH, EF 55-60%, tissue AVR not well seen, no leak, gradient not too high (mean ), MAC, mild MR, mild LAE, PASP 38  . OSA (obstructive sleep apnea) 1999    DOES NOT USE CPAP    ROS: Negative except as per HPI  BP 163/70  Pulse 60  Ht 5\' 8"  (1.727 m)  Wt 118.842 kg (262 lb)  BMI 39.84 kg/m2  PHYSICAL EXAM: Pt is alert and oriented, NAD HEENT: normal Neck: JVP - normal, carotids 2+= without bruits Lungs: CTA bilaterally CV: Irregularly irregular without murmur Abd: soft, NT, Positive BS, no hepatomegaly Ext: no C/C/E, distal pulses intact and equal Skin: warm/dry no rash  2-D echo: Study Conclusions  - Left ventricle: The cavity size was normal. Wall thickness was increased in a pattern of moderate LVH. Systolic function was normal. The estimated ejection fraction was in the range of 55% to 60%. - Aortic valve: Prosthetic tissue valve not well seen No perivalvular leak. Gradient not too high - Mitral valve: Calcified annulus. Mildly thickened leaflets . Mild regurgitation. - Left atrium: The atrium was mildly dilated. - Atrial septum: No defect or patent foramen ovale was identified. - Pulmonary arteries: PA peak pressure: 38mm Hg (S).  ASSESSMENT AND PLAN: 1. Aortic stenosis status post bioprosthetic aVR. The patient is doing well following surgery and is making a good recovery. I recommended that he reduce his furosemide dose to 80 mg once daily. Otherwise he will continue his same medications without changes.  2. Chronic diastolic heart failure. He appears compensated with no edema on exam. He will reduce his furosemide dose.  3. Permanent atrial fibrillation. He remains only on aspirin. He has a history of thrombocytopenia and cirrhosis as well as GI bleeding. He is not a candidate for anticoagulation.  4. Hypertension. The patient is on metoprolol and lisinopril as well as her Actonel. His blood pressure is elevated in the office  today. I would like to see him back in 2 months for followup and I've asked him to check his blood pressure at home to bring in a blood pressure log at the time of his return visit.  Tonny Bollman

## 2012-06-27 ENCOUNTER — Other Ambulatory Visit (INDEPENDENT_AMBULATORY_CARE_PROVIDER_SITE_OTHER): Payer: Managed Care, Other (non HMO)

## 2012-06-27 ENCOUNTER — Ambulatory Visit (INDEPENDENT_AMBULATORY_CARE_PROVIDER_SITE_OTHER): Payer: Managed Care, Other (non HMO) | Admitting: Internal Medicine

## 2012-06-27 ENCOUNTER — Encounter: Payer: Self-pay | Admitting: Internal Medicine

## 2012-06-27 VITALS — BP 120/84 | HR 78 | Temp 97.4°F | Ht 68.0 in | Wt 267.0 lb

## 2012-06-27 DIAGNOSIS — K7460 Unspecified cirrhosis of liver: Secondary | ICD-10-CM

## 2012-06-27 DIAGNOSIS — R0989 Other specified symptoms and signs involving the circulatory and respiratory systems: Secondary | ICD-10-CM

## 2012-06-27 DIAGNOSIS — J90 Pleural effusion, not elsewhere classified: Secondary | ICD-10-CM

## 2012-06-27 DIAGNOSIS — R0602 Shortness of breath: Secondary | ICD-10-CM

## 2012-06-27 DIAGNOSIS — R0600 Dyspnea, unspecified: Secondary | ICD-10-CM | POA: Insufficient documentation

## 2012-06-27 DIAGNOSIS — R059 Cough, unspecified: Secondary | ICD-10-CM

## 2012-06-27 DIAGNOSIS — I10 Essential (primary) hypertension: Secondary | ICD-10-CM

## 2012-06-27 DIAGNOSIS — R05 Cough: Secondary | ICD-10-CM

## 2012-06-27 LAB — HEPATIC FUNCTION PANEL
ALT: 23 U/L (ref 0–53)
AST: 23 U/L (ref 0–37)
Alkaline Phosphatase: 85 U/L (ref 39–117)
Bilirubin, Direct: 0.2 mg/dL (ref 0.0–0.3)
Total Bilirubin: 0.7 mg/dL (ref 0.3–1.2)
Total Protein: 8.3 g/dL (ref 6.0–8.3)

## 2012-06-27 LAB — CBC WITH DIFFERENTIAL/PLATELET
Basophils Absolute: 0 10*3/uL (ref 0.0–0.1)
Basophils Relative: 0.5 % (ref 0.0–3.0)
Eosinophils Absolute: 0.2 10*3/uL (ref 0.0–0.7)
Lymphocytes Relative: 11.5 % — ABNORMAL LOW (ref 12.0–46.0)
MCHC: 31.3 g/dL (ref 30.0–36.0)
MCV: 81.5 fl (ref 78.0–100.0)
Monocytes Absolute: 0.7 10*3/uL (ref 0.1–1.0)
Neutrophils Relative %: 78 % — ABNORMAL HIGH (ref 43.0–77.0)
Platelets: 196 10*3/uL (ref 150.0–400.0)
RBC: 4.58 Mil/uL (ref 4.22–5.81)
RDW: 16.6 % — ABNORMAL HIGH (ref 11.5–14.6)

## 2012-06-27 LAB — PROTIME-INR
INR: 1.1 ratio — ABNORMAL HIGH (ref 0.8–1.0)
Prothrombin Time: 11.8 s (ref 10.2–12.4)

## 2012-06-27 LAB — BASIC METABOLIC PANEL
BUN: 15 mg/dL (ref 6–23)
CO2: 25 mEq/L (ref 19–32)
Calcium: 9.4 mg/dL (ref 8.4–10.5)
Chloride: 101 mEq/L (ref 96–112)
Creatinine, Ser: 0.8 mg/dL (ref 0.4–1.5)
Glucose, Bld: 146 mg/dL — ABNORMAL HIGH (ref 70–99)

## 2012-06-27 MED ORDER — OLMESARTAN MEDOXOMIL 20 MG PO TABS: 20.0000 mg | ORAL_TABLET | Freq: Every day | ORAL | Status: DC

## 2012-06-27 MED ORDER — OMEPRAZOLE 20 MG PO CPDR: DELAYED_RELEASE_CAPSULE | ORAL | Status: DC

## 2012-06-27 MED ORDER — OLMESARTAN MEDOXOMIL 20 MG PO TABS: ORAL_TABLET | ORAL | Status: DC

## 2012-06-27 NOTE — Patient Instructions (Addendum)
Stop fish oil and the lisinopril  Start Benicar 20 mg one half daily and the cough and breathing should gradually improve over 6 weeks  Prilosec Take 30- 60 min before your first and last meals of the day   GERD (REFLUX)  is an extremely common cause of respiratory symptoms, many times with no significant heartburn at all.    It can be treated with medication, but also with lifestyle changes including avoidance of late meals, excessive alcohol, smoking cessation, and avoid fatty foods, chocolate, peppermint, colas, red wine, and acidic juices such as orange juice.  NO MINT OR MENTHOL PRODUCTS SO NO COUGH DROPS  USE SUGARLESS CANDY INSTEAD (jolley ranchers or Stover's)  NO OIL BASED VITAMINS - use powdered substitutes.    Please remember to go to the lab  department downstairs for your tests - we will call you with the results when they are available.

## 2012-06-27 NOTE — Progress Notes (Signed)
  Subjective:    Patient ID: [name], male    DOB: November 10, 1945  MRN: [id number]  HPI  79 yowm quit smoking 2002  With ok eval in 2003 referred 06/27/2012  by Dr Lenise Arena' PA for cough and  Mod R effusion with a h/o recent heart surgery 04/25/12 AVR and h/o cirrhosis with ascites as well.  See admit 10/2-8/13 Redo median sternotomy, aortic valve replacement with 23-mm  [facility] Ease bovine pericardial valve (model number [id number], serial number [id number]) by Dr. Dorris Fetch on 04/25/2012.     06/27/2012 1st pulmonary eval cc new abrupt onset dry cough/sob x one week seen Dec 2 with abn cxr but has been bothered by fatigue p avr Oct 2, severe cough dry, can't talk, doe  worse since first couple of weeks post op to point only about 50 feet assoc with coughing    Sleeping ok with head maybe up 30 degrees without nocturnal  or early am exacerbation  of respiratory  c/o's or need for noct saba. Also denies any obvious fluctuation of symptoms with weather or environmental changes or other aggravating or alleviating factors except as outlined above     Review of Systems  Constitutional: Negative for fever, chills, activity change, appetite change and unexpected weight change.  HENT: Positive for rhinorrhea. Negative for congestion, sore throat, sneezing, trouble swallowing, dental problem, voice change and postnasal drip.   Eyes: Negative for visual disturbance.  Respiratory: Positive for cough and shortness of breath. Negative for choking.   Cardiovascular: Negative for chest pain and leg swelling.  Gastrointestinal: Negative for nausea, vomiting and abdominal pain.  Genitourinary: Negative for difficulty urinating.  Musculoskeletal: Positive for arthralgias.  Skin: Negative for rash.  Psychiatric/Behavioral: Negative for behavioral problems and confusion.       Objective:   Physical Exam  [place] upper airway cough  Wt Readings from Last 3 Encounters:  06/27/12 267 lb (121.11 kg)   06/12/12 262 lb (118.842 kg)  05/22/12 264 lb (119.75 kg)    HEENT: nl dentition, turbinates, and orophanx. Nl external ear canals without cough reflex   NECK :  without JVD/Nodes/TM/ nl carotid upstrokes bilaterally   LUNGS: no acc muscle use,  Distant bs bilaterally with dullness R base   CV:  RRR  no s3 or murmur or increase in P2, no edema   ABD:  soft and nontender with nl excursion in the supine position. No bruits or organomegaly, bowel sounds nl  MS:  warm without deformities, calf tenderness, cyanosis or clubbing  SKIN: warm and dry without lesions    NEURO:  alert, approp, no deficits        Assessment & Plan:

## 2012-06-28 DIAGNOSIS — R05 Cough: Secondary | ICD-10-CM | POA: Insufficient documentation

## 2012-06-28 NOTE — Assessment & Plan Note (Signed)
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

## 2012-06-28 NOTE — Assessment & Plan Note (Addendum)
[place] dry upper airway pattern cough is typical of  Classic Upper airway cough syndrome, so named because it's frequently impossible to sort out how much is  CR/sinusitis with freq throat clearing (which can be related to primary GERD)   vs  causing  secondary (" extra esophageal")  GERD from wide swings in gastric pressure that occur with throat clearing, often  promoting self use of mint and menthol lozenges that reduce the lower esophageal sphincter tone and exacerbate the problem further in a cyclical fashion.   These are the same pts (now being labeled as having "irritable larynx syndrome" by some cough centers) who not infrequently have a history of having failed to tolerate ace inhibitors,  dry powder inhalers or biphosphonates or report having atypical reflux symptoms that don't respond to standard doses of PPI , and are easily confused as having aecopd or asthma flares by even experienced allergists/ pulmonologists.  Try off acei and rx gerd/  tap effusion off then regroup if cough still present

## 2012-06-28 NOTE — Assessment & Plan Note (Signed)
ACE inhibitors are problematic in  pts with airway complaints because  even experienced pulmonologists can't always distinguish ace effects from copd/asthma.  By themselves they don't actually cause a problem, much like oxygen can't by itself start a fire, but they certainly serve as a powerful catalyst or enhancer for any "fire"  or inflammatory process in the upper airway, be it caused by an ET  tube or more commonly reflux (especially in the obese or pts with known GERD or who are on biphoshonates).    In the era of ARB near equivalency until we have a better handle on the reversibility of the airway problem, it just makes sense to avoid ACEI  entirely in the short run and then decide later, having established a level of airway control using a reasonable limited regimen, whether to add back ace but even then being very careful to observe the pt for worsening airway control and number of meds used/ needed to control symptoms.    For now try benicar 20 mg one half daily

## 2012-06-29 ENCOUNTER — Telehealth: Payer: Self-pay | Admitting: Internal Medicine

## 2012-06-29 ENCOUNTER — Ambulatory Visit (HOSPITAL_COMMUNITY)
Admission: RE | Admit: 2012-06-29 | Discharge: 2012-06-29 | Disposition: A | Discharge status: Home / Self Care | Payer: Managed Care, Other (non HMO) | Source: Ambulatory Visit | Attending: Internal Medicine | Admitting: Internal Medicine

## 2012-06-29 ENCOUNTER — Ambulatory Visit (HOSPITAL_COMMUNITY)
Admission: RE | Admit: 2012-06-29 | Discharge: 2012-06-29 | Disposition: A | Discharge status: Home / Self Care | Payer: Managed Care, Other (non HMO) | Source: Ambulatory Visit | Attending: Radiology | Admitting: Radiology

## 2012-06-29 ENCOUNTER — Encounter: Payer: Self-pay | Admitting: Internal Medicine

## 2012-06-29 DIAGNOSIS — J90 Pleural effusion, not elsewhere classified: Secondary | ICD-10-CM | POA: Insufficient documentation

## 2012-06-29 LAB — GLUCOSE, SEROUS FLUID: Glucose, Fluid: 165 mg/dL

## 2012-06-29 LAB — BODY FLUID CELL COUNT WITH DIFFERENTIAL
Eos, Fluid: 0 %
Lymphs, Fluid: 59 %
Monocyte-Macrophage-Serous Fluid: 25 % — ABNORMAL LOW (ref 50–90)
Neutrophil Count, Fluid: 16 % (ref 0–25)
Total Nucleated Cell Count, Fluid: 1575 cu mm — ABNORMAL HIGH (ref 0–1000)

## 2012-06-29 LAB — LACTATE DEHYDROGENASE, PLEURAL OR PERITONEAL FLUID

## 2012-06-29 NOTE — Procedures (Signed)
US guided rt thora 1.5 liters dark yellow fluid  Pt tolerated well  CXR pending  Sent fot labs per MD

## 2012-06-29 NOTE — Telephone Encounter (Signed)
Spoke with patient, informed him of results as listed below per Dr. Sherene Sires. Orders have been placed for thoracentesis and corresponding labs.  Patient is aware of this and verbalized understanding.   Result Notes     Notes Recorded by Nyoka Cowden, MD on 06/27/2012 at 3:59 PM Call patient : Studies are unremarkable x esr high ? Why > needs to be set up for IR to tap R effusion dry and send for cell count and diff, glucose, protein, LDH and cytology   Patient is also requesting Dr. Rolin Barry recs on his cough.  Patient states he is having non-prod dry cough all day, all night and this is causing him to be unable to sleep.  Patient states per Dr. Sherene Sires he is not taking any cough drops but the prilosec does not seem to be helping either.  Dr. Sherene Sires please advise thank you.

## 2012-06-29 NOTE — Telephone Encounter (Signed)
[name] called from [facility] ultrasound and says the labs need to re reentered. They are unable to arrive anything. Labs for thoracentesis reentered and [name] made aware.

## 2012-06-29 NOTE — Progress Notes (Signed)
Quick Note:  Patient informed of lab results/recs and is also aware thoracentesis orders have been placed. Verbalized understanding of this and nothing further needed at this time. ______

## 2012-06-30 ENCOUNTER — Encounter: Payer: Self-pay | Admitting: Internal Medicine

## 2012-07-02 ENCOUNTER — Other Ambulatory Visit (HOSPITAL_COMMUNITY): Payer: Managed Care, Other (non HMO)

## 2012-07-02 NOTE — Progress Notes (Signed)
Quick Note:  Spoke with pt and notified of results per Dr. Wert. Pt verbalized understanding and denied any questions.  ______ 

## 2012-07-10 ENCOUNTER — Ambulatory Visit (INDEPENDENT_AMBULATORY_CARE_PROVIDER_SITE_OTHER): Payer: Managed Care, Other (non HMO) | Admitting: Internal Medicine

## 2012-07-10 ENCOUNTER — Encounter (INDEPENDENT_AMBULATORY_CARE_PROVIDER_SITE_OTHER): Payer: Self-pay | Admitting: Internal Medicine

## 2012-07-10 ENCOUNTER — Other Ambulatory Visit: Payer: Self-pay | Admitting: Physician Assistant

## 2012-07-10 VITALS — BP 128/70 | HR 78 | Temp 97.6°F | Resp 16 | Ht 68.0 in | Wt 262.2 lb

## 2012-07-10 DIAGNOSIS — Z87898 Personal history of other specified conditions: Secondary | ICD-10-CM

## 2012-07-10 DIAGNOSIS — E669 Obesity, unspecified: Secondary | ICD-10-CM

## 2012-07-10 DIAGNOSIS — D649 Anemia, unspecified: Secondary | ICD-10-CM

## 2012-07-10 DIAGNOSIS — K7460 Unspecified cirrhosis of liver: Secondary | ICD-10-CM

## 2012-07-10 NOTE — Patient Instructions (Signed)
Please talk with Dr. Excell Seltzer on your next visit regarding spironolactone and if you would benefit from rehabilitation.

## 2012-07-10 NOTE — Progress Notes (Signed)
Presenting complaint;  Followup for cirrhosis and ascites.  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 He was last seen on 04/05/2012. He underwent aortic valve replacement with tissue valve on 04/25/2012 and had uneventful hospital stay. Few weeks later he developed cough and shortness of breath. He was evaluated by Dr. Sandrea Hughs noted to have large right pleural effusion for which she underwent thoracenteses on 06/29/2012 and cultures were negative. Lisinopril was discontinued. Both his cough and shortness of breath have resolved. He has not lost any weight since his last visit. He denies dyspnea at rest. He is able to walk around 100 feet before getting short of breath. He denies abdominal pain or distention. He is not sure why a spiral electrode was discontinued. He is now taking 60 mEq of potassium daily. He will check with Dr. Excell Seltzer on his next visit. He denies melena or rectal bleeding.  Current Medications: Current Outpatient Prescriptions  Medication Sig Dispense Refill  . acetaminophen (TYLENOL) 325 MG tablet Take 650 mg by mouth daily.      . Ascorbic Acid (VITAMIN C) 1000 MG tablet Take 1,000 mg by mouth 2 (two) times daily.      [name] aspirin EC 81 MG EC tablet Take 1 tablet (81 mg total) by mouth daily.      [name] atorvastatin (LIPITOR) 20 MG tablet Take 20 mg by mouth at bedtime.       . digoxin (LANOXIN) 0.25 MG tablet TAKE 1 TABLET BY MOUTH EVERY DAY  90 tablet  1  . ferrous gluconate (FERGON) 325 MG tablet Take 325 mg by mouth 3 (three) times daily with meals.      . furosemide (LASIX) 80 MG tablet Take 1 tablet (80 mg total) by mouth daily.  60 tablet  6  . insulin detemir (LEVEMIR) 100 UNIT/ML injection Inject 50 Units into the skin daily.  10 mL  1  . insulin glargine (LANTUS) 100 UNIT/ML injection Inject 15 Units into the skin at bedtime.  10 mL  12  . metFORMIN (GLUCOPHAGE) 1000 MG  tablet Take 1,000 mg by mouth 2 (two) times daily with a meal.       . metoprolol tartrate (LOPRESSOR) 50 MG tablet Take 1 tablet (50 mg total) by mouth 2 (two) times daily.  60 tablet  1  . Multiple Vitamin (MULTIVITAMIN) tablet Take 1 tablet by mouth daily.       [name] olmesartan (BENICAR) 20 MG tablet One half daily      . omeprazole (PRILOSEC) 20 MG capsule Take 30- 60 min before your first and last meals of the day      . potassium chloride (K-DUR) 10 MEQ tablet 3 tabs am and 3 tabs pm      . verapamil (CALAN) 80 MG tablet Take 80 mg by mouth 3 (three) times daily.         Objective: Blood pressure 128/70, pulse 78, temperature 97.6 F (36.4 C), temperature source Oral, resp. rate 16, height 5\' 8"  (1.727 m), weight 262 lb 3.2 oz (118.933 kg). Patient is alert and in no acute distress. He does not have asterixis. Conjunctiva is pink. Sclera is nonicteric Oropharyngeal mucosa is normal. No neck masses or thyromegaly noted. Cardiac exam with regular rhythm normal S1 and S2. He has a grade 2/6 soft systolic murmur at LLSB and aortic area. Lungs are clear to auscultation. Abdomen is obese. Shifting dullness is absent.  It is soft and nontender and difficult to palpate his liver. No LE edema or clubbing noted.  Labs/studies Results: Lab data from 06/27/2012. Bilirubin oh 0.7, AP 85, AST 23, ALT 23 and albumin 3.5. Serum sodium 137, potassium 4.2, chloride 11, CO2 25, glucose 146, BUN 15, creatinine oh 0.8 and calcium 9.4. WBC 9.7, H&H 11.7 and 37.3 and platelet count 196K  Assessment:  #1. 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 He did not have hepatic decompensation following surgery for aortic valve replacement which implies he has reasonable hepatic function. Clinically he does not have ascites. #2. History of iron deficiency anemia and GI bleed secondary to angiodysplasia. He is now on low-dose aspirin and his hemoglobin  is the best it has been for several months but still not normal. #3. Obesity. He must start exercising so that he can lose weight otherwise liver disease will progress and lead to hepatic decompensation. He may benefit from dietary consultation if he is not successful on his own.   Plan:  No changes made to patient's diuretic therapy today. He will talk with Dr. Excell Seltzer if he could go back on spironolactone in which case KCl could be discontinued. He would also check with him if he would benefit from rehabilitation. Office visit in 6 months.

## 2012-07-16 ENCOUNTER — Ambulatory Visit (INDEPENDENT_AMBULATORY_CARE_PROVIDER_SITE_OTHER)
Admission: RE | Admit: 2012-07-16 | Discharge: 2012-07-16 | Disposition: A | Discharge status: Home / Self Care | Payer: Managed Care, Other (non HMO) | Source: Ambulatory Visit | Attending: Internal Medicine | Admitting: Internal Medicine

## 2012-07-16 ENCOUNTER — Encounter: Payer: Self-pay | Admitting: Internal Medicine

## 2012-07-16 ENCOUNTER — Other Ambulatory Visit (INDEPENDENT_AMBULATORY_CARE_PROVIDER_SITE_OTHER): Payer: Managed Care, Other (non HMO)

## 2012-07-16 ENCOUNTER — Ambulatory Visit (INDEPENDENT_AMBULATORY_CARE_PROVIDER_SITE_OTHER): Payer: Managed Care, Other (non HMO) | Admitting: Internal Medicine

## 2012-07-16 VITALS — BP 134/80 | HR 67 | Temp 98.1°F | Ht 68.0 in | Wt 268.0 lb

## 2012-07-16 DIAGNOSIS — J90 Pleural effusion, not elsewhere classified: Secondary | ICD-10-CM

## 2012-07-16 DIAGNOSIS — I10 Essential (primary) hypertension: Secondary | ICD-10-CM

## 2012-07-16 LAB — SEDIMENTATION RATE: Sed Rate: 32 mm/hr — ABNORMAL HIGH (ref 0–22)

## 2012-07-16 MED ORDER — OLMESARTAN MEDOXOMIL 20 MG PO TABS: ORAL_TABLET | ORAL | Status: DC

## 2012-07-16 NOTE — Patient Instructions (Addendum)
Please remember to go to the lab and x-ray department downstairs for your tests - we will call you with the results when they are available.  Continue on the benicar 20 one half daily for now

## 2012-07-16 NOTE — Progress Notes (Signed)
  Subjective:    Patient ID: [name], male    DOB: Dec 15, 1945  MRN: [id number]  HPI  21 yowm quit smoking 2002  With ok eval in 2003 referred 06/27/2012  by Dr Lenise Arena' PA for cough and  Mod R effusion with a h/o recent heart surgery 04/25/12 AVR and h/o cirrhosis with ascites as well.  See admit 10/2-8/13 Redo median sternotomy, aortic valve replacement with 23-mm  [facility] Ease bovine pericardial valve (model number [id number], serial number [id number]) by Dr. Dorris Fetch on 04/25/2012.     06/27/2012 1st pulmonary eval cc new abrupt onset dry cough/sob x one week seen Dec 2 with abn cxr but has been bothered by fatigue p avr Oct 2, severe cough dry, can't talk, doe  worse since first couple of weeks post op to point only about 50 feet assoc with coughing  rec Stop fish oil and the lisinopril Start Benicar 20 mg one half daily and the cough and breathing should gradually improve over 6 weeks Prilosec Take 30- 60 min before your first and last meals of the day  GERD   R thoracentesis by IR 06/29/2012 > 1.5 yellow fluid, exudate >> wbc 1575 with L > P   07/16/2012 f/u ov/Wert cc breathing better c minimal cough, now able to walk sev hundred feet s sob.    Sleeping ok with head maybe up 30 degrees without nocturnal  or early am exacerbation  of respiratory  c/o's or need for noct saba. Also denies any obvious fluctuation of symptoms with weather or environmental changes or other aggravating or alleviating factors except as outlined above   ROS  The following are not active complaints unless bolded sore throat, dysphagia, dental problems, itching, sneezing,  nasal congestion or excess/ purulent secretions, ear ache,   fever, chills, sweats, unintended wt loss, pleuritic or exertional cp, hemoptysis,  orthopnea pnd or leg swelling, presyncope, palpitations, heartburn, abdominal pain, anorexia, nausea, vomiting, diarrhea  or change in bowel or urinary habits, change in stools or urine,  dysuria,hematuria,  rash, arthralgias, visual complaints, headache, numbness weakness or ataxia or problems with walking or coordination,  change in mood/affect or memory.              Objective:   Physical Exam  [place] upper airway cough has resolved 07/16/2012  268  Wt Readings from Last 3 Encounters:  06/27/12 267 lb (121.11 kg)  06/12/12 262 lb (118.842 kg)  05/22/12 264 lb (119.75 kg)    HEENT: nl dentition, turbinates, and orophanx. Nl external ear canals without cough reflex   NECK :  without JVD/Nodes/TM/ nl carotid upstrokes bilaterally   LUNGS: no acc muscle use,  Distant bs bilaterally with min dullness R base   CV:  RRR  no s3 or murmur or increase in P2, no edema   ABD:  soft and nontender with nl excursion in the supine position. No bruits or organomegaly, bowel sounds nl  MS:  warm without deformities, calf tenderness, cyanosis or clubbing  SKIN: warm and dry without lesions      CXR  07/16/2012 :  Stable small right pleural effusion with right basilar atelectasis  or infiltrate. No pulmonary edema.         Assessment & Plan:

## 2012-07-16 NOTE — Assessment & Plan Note (Signed)
Trial off acei started 06/28/2012 due to cough> marked improvement so continue off ace for now.

## 2012-07-16 NOTE — Assessment & Plan Note (Signed)
-   First noted 04/28/12 on plain cxr - Labs 06/27/2012    ESR 88 with bnp 63 - Rec R thoracentesis by IR 06/29/2012 > 1.5 yellow fluid, exudate >> wbc 1575 with L > P  With cxr 07/16/2012 >>improved and esr 32  Probobaly late version of a post thoracotomy effusion with esr down nicely s rx > no further rx or w/u should be needed.

## 2012-07-17 NOTE — Progress Notes (Signed)
Quick Note:  Spoke with pt and notified of results per Dr. Wert. Pt verbalized understanding and denied any questions.  ______ 

## 2012-08-15 ENCOUNTER — Encounter: Payer: Self-pay | Admitting: Cardiovascular Disease

## 2012-08-15 ENCOUNTER — Ambulatory Visit (INDEPENDENT_AMBULATORY_CARE_PROVIDER_SITE_OTHER): Payer: Managed Care, Other (non HMO) | Admitting: Cardiovascular Disease

## 2012-08-15 VITALS — BP 138/87 | HR 96 | Ht 68.0 in | Wt 276.4 lb

## 2012-08-15 DIAGNOSIS — I4891 Unspecified atrial fibrillation: Secondary | ICD-10-CM

## 2012-08-15 DIAGNOSIS — I5033 Acute on chronic diastolic (congestive) heart failure: Secondary | ICD-10-CM

## 2012-08-15 DIAGNOSIS — R0602 Shortness of breath: Secondary | ICD-10-CM

## 2012-08-15 LAB — BASIC METABOLIC PANEL
CO2: 26 mEq/L (ref 19–32)
Chloride: 103 mEq/L (ref 96–112)
Creatinine, Ser: 0.9 mg/dL (ref 0.4–1.5)

## 2012-08-15 MED ORDER — FUROSEMIDE 80 MG PO TABS: 80.0000 mg | ORAL_TABLET | Freq: Two times a day (BID) | ORAL | Status: DC

## 2012-08-15 NOTE — Progress Notes (Signed)
HPI:  67 year old gentleman presenting for followup evaluation. 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 He was treated with a bioprosthetic valve. All of his bypass grafts were patent so he did not require redo CABG. The patient has not been anticoagulated because of GI bleeding. He has multiple medical comorbidities that include hepatic cirrhosis, type 2 diabetes, and thrombocytopenia.  The patient complains of progressive shortness of breath over the last 3-4 weeks. He is comfortable at rest when he is sitting upright. However, he is very short of breath with minimal activity such as walking down his hallway. He also describes orthopnea and PND. He's had some leg swelling but no major change noted. He denies abdominal swelling. He's had no change in his appetite. He has not been following a low salt diet. He's not had any recent chest pain or pressure. He denies lightheadedness or syncope. He's had some palpitations.  Outpatient Encounter Prescriptions as of 08/15/2012  Medication Sig Dispense Refill  . acetaminophen (TYLENOL) 325 MG tablet Take 650 mg by mouth daily.      . Ascorbic Acid (VITAMIN C) 1000 MG tablet Take 1,000 mg by mouth 2 (two) times daily.      [name] aspirin EC 81 MG EC tablet Take 1 tablet (81 mg total) by mouth daily.      [name] atorvastatin (LIPITOR) 20 MG tablet Take 20 mg by mouth at bedtime.       . B-D INS SYRINGE 0.5CC/30GX1/2" 30G X 1/2" 0.5 ML MISC       . digoxin (LANOXIN) 0.25 MG tablet TAKE 1 TABLET BY MOUTH EVERY DAY  90 tablet  1  . ferrous gluconate (FERGON) 325 MG tablet Take 325 mg by mouth 3 (three) times daily with meals.      . furosemide (LASIX) 80 MG tablet Take 1 tablet (80 mg total) by mouth daily.  60 tablet  6  . insulin detemir (LEVEMIR) 100 UNIT/ML injection Inject 50 Units into the skin daily.  10  mL  1  . insulin glargine (LANTUS) 100 UNIT/ML injection Inject 15 Units into the skin at bedtime.  10 mL  12  . metFORMIN (GLUCOPHAGE) 1000 MG tablet Take 1,000 mg by mouth 2 (two) times daily with a meal.       . metoprolol tartrate (LOPRESSOR) 50 MG tablet Take 1 tablet (50 mg total) by mouth 2 (two) times daily.  60 tablet  1  . Multiple Vitamin (MULTIVITAMIN) tablet Take 1 tablet by mouth daily.       [name] olmesartan (BENICAR) 20 MG tablet One half daily  15 tablet  11  . omeprazole (PRILOSEC) 20 MG capsule Take 30- 60 min before your first and last meals of the day      . potassium chloride (K-DUR) 10 MEQ tablet 3 tabs am and 3 tabs pm      . verapamil (CALAN) 80 MG tablet Take 80 mg by mouth 3 (three) times daily.      . [DISCONTINUED] KLOR-CON M10 10 MEQ tablet         Allergies  Allergen Reactions  . Codeine Other (See Comments)    Hurting in chest  . Diltiazem Hcl Itching  . Niacin Other (See Comments)    Hot flashes    Past Medical History  Diagnosis Date  . Overweight   . CAD (coronary artery disease)  a. s/p CABG 2004;  b. LHC 10/13:  LHC 8/13: Free radial to obtuse marginal patent, SVG-diagonal patent, LIMA-LAD patent, EF 65-70%, mean aortic valve gradient 42  . Atrial fibrillation     Permanent; off of Coumadin for now due to GI bleed  . DM2 (diabetes mellitus, type 2)   . Insomnia   . Carotid bruit 06/14/2011    a. pre-AVR dopplers 10/13: no sig ICA stenosis  . Hypertension   . Chronic diastolic heart failure   . GERD (gastroesophageal reflux disease)   . COPD (chronic obstructive pulmonary disease)   . Iron deficiency anemia     Requiring intravenous iron  . H/O hiatal hernia   . AVM (arteriovenous malformation)     Recurrent GI bleeding requiring multiple transfusions  . Hyperlipidemia   . Thrombocytopenia   . Ascites     status post paracentesis with removal of 3.4 L of ascitic fluid  . Osteoarthritis   . Mediastinal adenopathy 09/22/2011  . Cirrhosis    . Iron deficiency anemia   . Mediastinal adenopathy 09/22/2011  . Aortic stenosis 03/08/2012    a.  s/p tissue AVR 10/13 with Dr. Dorris Fetch;   b. Echo 10/13: mod LVH, EF 55-60%, tissue AVR not well seen, no leak, gradient not too high (mean ), MAC, mild MR, mild LAE, PASP 38  . OSA (obstructive sleep apnea) 1999    DOES NOT USE CPAP    ROS: Negative except as per HPI  BP 138/87  Pulse 96  Ht 5\' 8"  (1.727 m)  Wt 125.374 kg (276 lb 6.4 oz)  BMI 42.03 kg/m2  PHYSICAL EXAM: Pt is alert and oriented, overweight pleasant male in NAD HEENT: normal Neck: JVP - moderately elevated, carotids 2+= without bruits Lungs: CTA bilaterally CV: Tachycardic and irregular without murmur or gallop Abd: soft, NT, mildly distended and nontender, no obvious organomegaly Ext: 1+ pretibial edema bilaterally, distal pulses intact and equal Skin: warm/dry no rash  ASSESSMENT AND PLAN: 1. Acute on chronic diastolic heart failure. Long discussion today with the patient and his wife. He clearly has symptoms of CHF. His weight is up 14 pounds since his visit in November. We discussed the importance of sodium restriction. I am going to increase his Lasix to have asked him to take 120 mg this afternoon and again tomorrow morning. He then will start 80 mg twice daily which is double his current dose. No other medication changes were made today. I asked him to again weighing himself daily. He will call by the end of the week if his symptoms are not improving or if they are progressing. I would like him to be seen back closely in followup next week and a visit will be arranged with one of our physician extenders.  2. Atrial fibrillation with RVR. The patient's heart rate is 135 just with getting up to the exam bed. His resting heart rate was in the 90s. I'm not going to increase his beta blocker in the setting of acute heart failure but this will need to be watched carefully. I suspect his heart rate is more  elevated because of congestive heart failure.  3. Severe aortic stenosis status post bioprosthetic aVR. Followup echocardiogram showed normal function of his aortic valve prosthesis.  Disposition: Close followup next week. Increase diuretic dose as outlined. The patient will contact us if symptoms worsen. He may ultimately require hospitalization but hopefully he will respond as an outpatient. His situation is complicated by hepatic cirrhosis. Unfortunately he  is intolerant to Spirinolactone because of tender gynecomastia. Tonny Bollman 08/15/2012 10:57 AM

## 2012-08-15 NOTE — Patient Instructions (Addendum)
Your physician recommends that you schedule a follow-up appointment in:  1 week with PA or NP.  Your physician has recommended you make the following change in your medication:  Take furosemide 120 mg this afternoon. Take furosemide 120 mg tomorrow morning.  After this change to furosemide 80 mg by mouth twice daily  We will call you with the results of lab work done today

## 2012-08-17 ENCOUNTER — Telehealth: Payer: Self-pay | Admitting: Cardiovascular Disease

## 2012-08-17 NOTE — Telephone Encounter (Signed)
Walk In pt Form Pt Dropped Off "[employer]" Form Sent to [facility]  08/17/12/KM

## 2012-08-21 ENCOUNTER — Other Ambulatory Visit (HOSPITAL_BASED_OUTPATIENT_CLINIC_OR_DEPARTMENT_OTHER): Payer: Managed Care, Other (non HMO) | Admitting: Lab

## 2012-08-21 ENCOUNTER — Telehealth: Payer: Self-pay | Admitting: Oncology

## 2012-08-21 ENCOUNTER — Encounter: Payer: Self-pay | Admitting: Oncology

## 2012-08-21 ENCOUNTER — Ambulatory Visit (HOSPITAL_BASED_OUTPATIENT_CLINIC_OR_DEPARTMENT_OTHER): Payer: Managed Care, Other (non HMO) | Admitting: Oncology

## 2012-08-21 VITALS — BP 123/67 | HR 68 | Temp 97.0°F | Resp 20 | Ht 68.0 in | Wt 269.2 lb

## 2012-08-21 DIAGNOSIS — D509 Iron deficiency anemia, unspecified: Secondary | ICD-10-CM

## 2012-08-21 DIAGNOSIS — D696 Thrombocytopenia, unspecified: Secondary | ICD-10-CM

## 2012-08-21 DIAGNOSIS — D649 Anemia, unspecified: Secondary | ICD-10-CM

## 2012-08-21 DIAGNOSIS — R590 Localized enlarged lymph nodes: Secondary | ICD-10-CM

## 2012-08-21 DIAGNOSIS — R599 Enlarged lymph nodes, unspecified: Secondary | ICD-10-CM

## 2012-08-21 LAB — COMPREHENSIVE METABOLIC PANEL (CC13)
ALT: 34 U/L (ref 0–55)
AST: 27 U/L (ref 5–34)
Albumin: 3.9 g/dL (ref 3.5–5.0)
Calcium: 10 mg/dL (ref 8.4–10.4)
Chloride: 102 mEq/L (ref 98–107)
Potassium: 4.4 mEq/L (ref 3.5–5.1)

## 2012-08-21 LAB — CBC WITH DIFFERENTIAL/PLATELET
Eosinophils Absolute: 0.2 10*3/uL (ref 0.0–0.5)
HCT: 38.6 % (ref 38.4–49.9)
LYMPH%: 16.1 % (ref 14.0–49.0)
MCHC: 32.4 g/dL (ref 32.0–36.0)
MONO#: 0.6 10*3/uL (ref 0.1–0.9)
NEUT#: 5.2 10*3/uL (ref 1.5–6.5)
NEUT%: 71.5 % (ref 39.0–75.0)
Platelets: 110 10*3/uL — ABNORMAL LOW (ref 140–400)
WBC: 7.2 10*3/uL (ref 4.0–10.3)

## 2012-08-21 LAB — FERRITIN: Ferritin: 65 ng/mL (ref 22–322)

## 2012-08-21 LAB — IRON AND TIBC: %SAT: 18 % — ABNORMAL LOW (ref 20–55)

## 2012-08-21 NOTE — Progress Notes (Signed)
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   CURRENT THERAPY: oral and IV iron.   INTERVAL HISTORY: [name] 67 y.o. male returns for regular follow up with his wife.  He reports with his increasing Hgb from oral and IV iron, his strength and stamina have improved.  He still have fatigue, SOB, and DOE on exertion but better than last year.  He is being followed closely by Cardiology for CHF.  Patient denies fever, anorexia, weight loss, fatigue, headache, visual changes, confusion, drenching night sweats, palpable lymph node swelling, mucositis, odynophagia, dysphagia, nausea vomiting, jaundice, chest pain, palpitation, productive cough, gum bleeding, epistaxis, hematemesis, hemoptysis, abdominal pain, abdominal swelling, early satiety, melena, hematochezia, hematuria, skin rash, spontaneous bleeding, heat or cold intolerance, bowel bladder incontinence, back pain, focal motor weakness, paresthesia, depression, suicidal or homocidal ideation, feeling hopelessness.   Past Medical History  Diagnosis Date  . Overweight   . CAD (coronary artery disease)     a. s/p CABG 2004;  b. LHC 10/13:  LHC 8/13: Free radial to obtuse marginal patent, SVG-diagonal patent, LIMA-LAD patent, EF 65-70%, mean aortic valve gradient 42  . Atrial fibrillation     Permanent; off of Coumadin for now due to GI bleed  . DM2 (diabetes mellitus, type 2)   . Insomnia   . Carotid bruit 06/14/2011    a. pre-AVR dopplers 10/13: no sig ICA stenosis  . Hypertension   . Chronic diastolic heart failure   . GERD (gastroesophageal reflux disease)   . COPD (chronic obstructive pulmonary disease)   . Iron deficiency anemia     Requiring intravenous iron  . H/O hiatal hernia   . AVM (arteriovenous malformation)     Recurrent GI bleeding requiring  multiple transfusions  . Hyperlipidemia   . Thrombocytopenia   . Ascites     status post paracentesis with removal of 3.4 L of ascitic fluid  . Osteoarthritis   . Mediastinal adenopathy 09/22/2011  . Cirrhosis   . Iron deficiency anemia   . Mediastinal adenopathy 09/22/2011  . Aortic stenosis 03/08/2012    a.  s/p tissue AVR 10/13 with Dr. Dorris Fetch;   b. Echo 10/13: mod LVH, EF 55-60%, tissue AVR not well seen, no leak, gradient not too high (mean ), MAC, mild MR, mild LAE, PASP 38  . OSA (obstructive sleep apnea) 1999    DOES NOT USE CPAP    Past Surgical History  Procedure Date  . Coronary artery bypass graft  10/15/2002     [facility] Dec[facility]. Dorris Fetch, M.D.     . Carpal tunnel release   10/08/2003  . Lipoma surgery   . Hernia repair   . Other surgical history 08/26/2011    [facility],  enteroscopy , revealing "three-four AVMs."   . Tee without cardioversion 03/07/2012    Procedure: TRANSESOPHAGEAL ECHOCARDIOGRAM (TEE);  Surgeon: Vesta Mixer, MD;  Location: [facility] ENDOSCOPY;  Service: Cardiovascular;  Laterality: N/A;  . Coronary angioplasty with stent placement 01/19/2005    drug eluting stent to high grade ostial stenosis of radial artery graft to OM  . Aortic valve replacement 04/25/2012    Procedure: AORTIC VALVE REPLACEMENT (AVR);  Surgeon: Loreli Slot, MD;  Location: [facility] OR;  Service: Open Heart Surgery;  Laterality: N/A;    Current Outpatient Prescriptions  Medication Sig Dispense Refill  . Ascorbic  Acid (VITAMIN C) 1000 MG tablet Take 1,000 mg by mouth 2 (two) times daily.      [name] aspirin EC 81 MG EC tablet Take 1 tablet (81 mg total) by mouth daily.      [name] atorvastatin (LIPITOR) 20 MG tablet Take 20 mg by mouth at bedtime.       . B-D INS SYRINGE 0.5CC/30GX1/2" 30G X 1/2" 0.5 ML MISC       . digoxin (LANOXIN) 0.25 MG tablet TAKE 1 TABLET BY MOUTH EVERY DAY  90 tablet  1  . ferrous gluconate (FERGON) 325 MG tablet Take 325 mg by mouth 3 (three) times daily with meals.       . furosemide (LASIX) 80 MG tablet Take 1 tablet (80 mg total) by mouth 2 (two) times daily.  60 tablet  6  . insulin detemir (LEVEMIR) 100 UNIT/ML injection Inject 50 Units into the skin daily.  10 mL  1  . insulin glargine (LANTUS) 100 UNIT/ML injection Inject 15 Units into the skin at bedtime.  10 mL  12  . metFORMIN (GLUCOPHAGE) 1000 MG tablet Take 1,000 mg by mouth 2 (two) times daily with a meal.       . metoprolol tartrate (LOPRESSOR) 50 MG tablet Take 1 tablet (50 mg total) by mouth 2 (two) times daily.  60 tablet  1  . Multiple Vitamin (MULTIVITAMIN) tablet Take 1 tablet by mouth daily.       [name] olmesartan (BENICAR) 20 MG tablet One half daily  15 tablet  11  . omeprazole (PRILOSEC) 20 MG capsule Take 30- 60 min before your first and last meals of the day      . potassium chloride (K-DUR) 10 MEQ tablet 3 tabs am and 3 tabs pm      . verapamil (CALAN) 80 MG tablet Take 80 mg by mouth 3 (three) times daily.        ALLERGIES:  is allergic to codeine; diltiazem hcl; and niacin.  REVIEW OF SYSTEMS:  The rest of the 14-point review of system was negative.   Filed Vitals:   08/21/12 0922  BP: 123/67  Pulse: 68  Temp: 97 F (36.1 C)  Resp: 20   Wt Readings from Last 3 Encounters:  08/21/12 269 lb 3.2 oz (122.108 kg)  08/15/12 276 lb 6.4 oz (125.374 kg)  07/16/12 268 lb (121.564 kg)   ECOG Performance status: 1-2  PHYSICAL EXAMINATION:   General:  Obese man, in no acute distress.  Eyes:  no scleral icterus.  ENT:  There were no oropharyngeal lesions.  Neck was without thyromegaly.  Lymphatics:  Negative cervical, supraclavicular or axillary adenopathy.  Respiratory: lungs were clear bilaterally without wheezing or crackles.  Cardiovascular:  Irregularly irregular, S1/S2, without murmur, rub or gallop.  There was no pedal edema.  GI:  abdomen was soft, flat, nontender, nondistended, without organomegaly.  Muscoloskeletal:  no spinal tenderness of palpation of vertebral spine.   Skin exam was without echymosis, petichae.  Neuro exam was nonfocal.  Patient was able to get on and off exam table without assistance.  Gait was normal.  Patient was alerted and oriented.  Attention was good.   Language was appropriate.  Mood was normal without depression.  Speech was not pressured.  Thought content was not tangential.     LABORATORY/RADIOLOGY DATA:  Lab Results  Component Value Date   WBC 7.2 08/21/2012   HGB 12.5* 08/21/2012   HCT 38.6 08/21/2012   PLT 110* 08/21/2012  GLUCOSE 170* 08/21/2012   ALKPHOS 85 08/21/2012   ALT 34 08/21/2012   AST 27 08/21/2012   NA 138 08/21/2012   K 4.4 08/21/2012   CL 102 08/21/2012   CREATININE 0.9 08/21/2012   BUN 18.1 08/21/2012   CO2 24 08/21/2012   INR 1.1* 06/27/2012   HGBA1C 8.7* 04/23/2012    ASSESSMENT AND PLAN:  1. HTN: under good control with Benicar, Verapamil, Metoprolol, and Lasix per Cardiology.  2. DM, type II: Under good control with Insulin and Metformin.  3. HLP: He is on Lipitor per PCP.  4. Afib: He is rate controlled on metoprolol and verapamil. Coumadin is on hold due to GI bleed. Patient now on ASA per Cardiology.  5. CAD: He is in ASA, Benicar, metoprolol, Lipitor.  6. CHF: He is on Benicar, metoprolol, Lasix, Digoxin.  7. AVM: Extensive work up by GI.  8. Chronic microcytic anemia of iron deficiency:  Continue with oral iron as he is tolerating well.  We may consider repeat IV iron in the future if he has severe iron deficiency.  9. 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  There is no active bleeding.  There is no indication for transfusion.  10. Mediastinal adenopathy: Most likely reactive. CT scan from August 2013   With improvement in adenopathy. Follow-up recommended in 18-24 months. 11. Ascites: due to cirrhosis.  Management per GI.  12. Follow up: Lab only appointment in about 3 months.  Return visit in about 6 months.      The length of time of the face-to-face encounter was 15  minutes. More than 50% of time was spent counseling and coordination of care.

## 2012-08-21 NOTE — Telephone Encounter (Signed)
Gave pt appt calendar for lab on April  and MD on July 2014 with labs

## 2012-08-22 ENCOUNTER — Telehealth: Payer: Self-pay | Admitting: *Deleted

## 2012-08-22 ENCOUNTER — Ambulatory Visit: Payer: Managed Care, Other (non HMO) | Admitting: Nurse Practitioner

## 2012-08-22 NOTE — Telephone Encounter (Signed)
S/w pt reschedule appt due to inclement weather for 08/24/12

## 2012-08-24 ENCOUNTER — Encounter: Payer: Self-pay | Admitting: Nurse Practitioner

## 2012-08-24 ENCOUNTER — Ambulatory Visit (INDEPENDENT_AMBULATORY_CARE_PROVIDER_SITE_OTHER): Payer: Managed Care, Other (non HMO) | Admitting: Nurse Practitioner

## 2012-08-24 VITALS — BP 138/76 | HR 68 | Ht 68.0 in | Wt 268.8 lb

## 2012-08-24 DIAGNOSIS — I5031 Acute diastolic (congestive) heart failure: Secondary | ICD-10-CM

## 2012-08-24 NOTE — Patient Instructions (Addendum)
Continue with your current medicines.  Weigh yourself each morning and record.  Take extra dose of diuretic for weight gain of 3 pounds in 24 hours.   Limit sodium intake. Goal is to have less than 2000 mg (2gm) of salt per day.  I want to see you in 2 weeks. We will check labs that day but not fasting.  Call the [facility] office at [phone] if you have any questions, problems or concerns.

## 2012-08-24 NOTE — Progress Notes (Signed)
[name] Date of Birth: 07-14-1946 Medical Record #[id number]  History of Present Illness: [name] is seen back today for a one week check. He is seen for Dr. Excell Seltzer. He has chronic atrial fib, multivessel CAD with remote CABG, diastolic heart failure and recent redo cardiac surgery for severe AS. He was treated with a bioprosthetic valve. All of his bypass grafts were patent and he did not require redo CABG. He is not on anticoagulation due to GI bleeding. He has multiple medical comorbidities as outlined below and these include hepatic cirrhosis, type 2 DM, and thrombocytopenia. He has had a follow up echo showing normal function of his AV prosthesis. He is intolerant to Aldactone due to gynecomastia.   He was here a week ago with worsening heart failure symptoms. Also endorsed orthopnea and PND with some swelling. He had not been restricting his salt. Weight was up 14 pounds. He was advised to increase his Lasix and Dr. Excell Seltzer had a long discussion regarding CHF in general.   He comes back today. He is here with his wife. He is doing ok. Feels some better. No more PND/orthopnea. Has been sleeping all night just over the last night or so and is improved. Less swelling. Has cut back on the salt. Weight is down about 8 pounds. No chest pain.   Current Outpatient Prescriptions on File Prior to Visit  Medication Sig Dispense Refill  . Ascorbic Acid (VITAMIN C) 1000 MG tablet Take 1,000 mg by mouth 2 (two) times daily.      [name] aspirin EC 81 MG EC tablet Take 1 tablet (81 mg total) by mouth daily.      [name] atorvastatin (LIPITOR) 20 MG tablet Take 20 mg by mouth at bedtime.       . digoxin (LANOXIN) 0.25 MG tablet TAKE 1 TABLET BY MOUTH EVERY DAY  90 tablet  1  . ferrous gluconate (FERGON) 325 MG tablet Take 325 mg by mouth 3 (three) times daily with meals.      . furosemide (LASIX) 80 MG tablet Take 1 tablet (80 mg total) by mouth 2 (two) times daily.  60 tablet  6  . insulin detemir  (LEVEMIR) 100 UNIT/ML injection Inject 50 Units into the skin daily.  10 mL  1  . insulin glargine (LANTUS) 100 UNIT/ML injection Inject 15 Units into the skin at bedtime.  10 mL  12  . metFORMIN (GLUCOPHAGE) 1000 MG tablet Take 1,000 mg by mouth 2 (two) times daily with a meal.       . metoprolol tartrate (LOPRESSOR) 50 MG tablet Take 1 tablet (50 mg total) by mouth 2 (two) times daily.  60 tablet  1  . Multiple Vitamin (MULTIVITAMIN) tablet Take 1 tablet by mouth daily.       [name] olmesartan (BENICAR) 20 MG tablet One half daily  15 tablet  11  . omeprazole (PRILOSEC) 20 MG capsule Take 30- 60 min before your first and last meals of the day      . potassium chloride (K-DUR) 10 MEQ tablet 3 tabs am and 3 tabs pm      . verapamil (CALAN) 80 MG tablet Take 80 mg by mouth 3 (three) times daily.        Allergies  Allergen Reactions  . Codeine Other (See Comments)    Hurting in chest  . Diltiazem Hcl Itching  . Niacin Other (See Comments)    Hot flashes    Past Medical History  Diagnosis Date  . Overweight   . CAD (coronary artery disease)     a. s/p CABG 2004;  b. LHC 10/13:  LHC 8/13: Free radial to obtuse marginal patent, SVG-diagonal patent, LIMA-LAD patent, EF 65-70%, mean aortic valve gradient 42  . Atrial fibrillation     Permanent; off of Coumadin for now due to GI bleed  . DM2 (diabetes mellitus, type 2)   . Insomnia   . Carotid bruit 06/14/2011    a. pre-AVR dopplers 10/13: no sig ICA stenosis  . Hypertension   . Chronic diastolic heart failure   . GERD (gastroesophageal reflux disease)   . COPD (chronic obstructive pulmonary disease)   . Iron deficiency anemia     Requiring intravenous iron  . H/O hiatal hernia   . AVM (arteriovenous malformation)     Recurrent GI bleeding requiring multiple transfusions  . Hyperlipidemia   . Thrombocytopenia   . Ascites     status post paracentesis with removal of 3.4 L of ascitic fluid  . Osteoarthritis   . Mediastinal adenopathy  09/22/2011  . Cirrhosis   . Iron deficiency anemia   . Mediastinal adenopathy 09/22/2011  . Aortic stenosis 03/08/2012    a.  s/p tissue AVR 10/13 with Dr. Dorris Fetch;   b. Echo 10/13: mod LVH, EF 55-60%, tissue AVR not well seen, no leak, gradient not too high (mean ), MAC, mild MR, mild LAE, PASP 38  . OSA (obstructive sleep apnea) 1999    DOES NOT USE CPAP    Past Surgical History  Procedure Date  . Coronary artery bypass graft  10/15/2002     [facility] Decent. Dorris Fetch, M.D.     . Carpal tunnel release   10/08/2003  . Lipoma surgery   . Hernia repair   . Other surgical history 08/26/2011    [facility],  enteroscopy , revealing "three-four AVMs."   . Tee without cardioversion 03/07/2012    Procedure: TRANSESOPHAGEAL ECHOCARDIOGRAM (TEE);  Surgeon: Vesta Mixer, MD;  Location: [facility] ENDOSCOPY;  Service: Cardiovascular;  Laterality: N/A;  . Coronary angioplasty with stent placement 01/19/2005    drug eluting stent to high grade ostial stenosis of radial artery graft to OM  . Aortic valve replacement 04/25/2012    Procedure: AORTIC VALVE REPLACEMENT (AVR);  Surgeon: Loreli Slot, MD;  Location: [facility] OR;  Service: Open Heart Surgery;  Laterality: N/A;    History  Smoking status  . Former Smoker -- 1.0 packs/day for 30 years  . Types: Cigarettes  . Quit date: 07/25/2000  Smokeless tobacco  . Never Used    History  Alcohol Use  . 0.6 oz/week  . 1 Shots of liquor per week    Comment: Occasionally    Family History  Problem Relation Age of Onset  . Coronary artery disease      FAMILY HISTORY  . Hypertension Father   . Diabetes Father   . Heart disease Father   . COPD Sister   . Cancer Maternal Aunt     Breast cancer   . Cancer Maternal Aunt     Breast cancer   . Diabetes Son   . Cancer Daughter     Cervical cancer    Review of Systems: The review of systems is per the HPI.  All other systems were reviewed and are negative.  Physical Exam: BP 138/76  Pulse 68   Ht 5\' 8"  (1.727 m)  Wt 268 lb 12.8 oz (121.927 kg)  BMI 40.87 kg/m2 Patient  is very pleasant and in no acute distress. He is obese. Looks chronically ill and older than his stated age. Skin is warm and dry. Color is normal.  HEENT is unremarkable. Normocephalic/atraumatic. PERRL. Sclera are nonicteric. Neck is supple. No masses. No JVD. Lungs are clear. Cardiac exam shows an irregular rhythm. His rate is more controlled today. Abdomen is obese but soft. Extremities are with trace edema. Gait and ROM are intact. No gross neurologic deficits noted.  LABORATORY DATA:   Lab Results  Component Value Date   WBC 7.2 08/21/2012   HGB 12.5* 08/21/2012   HCT 38.6 08/21/2012   PLT 110* 08/21/2012   GLUCOSE 170* 08/21/2012   ALT 34 08/21/2012   AST 27 08/21/2012   NA 138 08/21/2012   K 4.4 08/21/2012   CL 102 08/21/2012   CREATININE 0.9 08/21/2012   BUN 18.1 08/21/2012   CO2 24 08/21/2012   TSH 2.47 06/27/2012   INR 1.1* 06/27/2012   HGBA1C 8.7* 04/23/2012   Echo Study Conclusions from October 2013  - Left ventricle: The cavity size was normal. Wall thickness was increased in a pattern of moderate LVH. Systolic function was normal. The estimated ejection fraction was in the range of 55% to 60%. - Aortic valve: Prosthetic tissue valve not well seen No perivalvular leak. Gradient not too high - Mitral valve: Calcified annulus. Mildly thickened leaflets . Mild regurgitation. - Left atrium: The atrium was mildly dilated. - Atrial septum: No defect or patent foramen ovale was identified. - Pulmonary arteries: PA peak pressure: 38mm Hg (S).   Assessment / Plan: 1. Acute on chronic diastolic heart failure - he is improved over this past week. I have left him on his current regimen. I will see him back in 2 weeks. Check BMET on return. He is advised to continue with salt restriction and daily weights.   2. Atrial fib - rate was elevated last week with just minimal movement in the exam room - I do not see  this today. His rate is well controlled. I have left him on his current dose of metoprolol. Not a candidate for anticoagulation due to GI bleeding.   3. Severe AS with recent AVR - has had follow up echo.   4. CAD - no chest pain reported.   Patient is agreeable to this plan and will call if any problems develop in the interim.

## 2012-08-29 ENCOUNTER — Other Ambulatory Visit: Payer: Self-pay | Admitting: Cardiovascular Disease

## 2012-09-04 ENCOUNTER — Telehealth: Payer: Self-pay | Admitting: *Deleted

## 2012-09-04 NOTE — Telephone Encounter (Signed)
S/w pt's wife moved appt. Due to inclement  weather to 09/11/12 @ 9:00

## 2012-09-07 ENCOUNTER — Ambulatory Visit: Payer: Managed Care, Other (non HMO) | Admitting: Nurse Practitioner

## 2012-09-11 ENCOUNTER — Encounter: Payer: Self-pay | Admitting: Nurse Practitioner

## 2012-09-11 ENCOUNTER — Ambulatory Visit (INDEPENDENT_AMBULATORY_CARE_PROVIDER_SITE_OTHER): Payer: Managed Care, Other (non HMO) | Admitting: Nurse Practitioner

## 2012-09-11 VITALS — BP 140/70 | HR 76 | Ht 68.0 in | Wt 270.1 lb

## 2012-09-11 DIAGNOSIS — I5032 Chronic diastolic (congestive) heart failure: Secondary | ICD-10-CM

## 2012-09-11 LAB — BASIC METABOLIC PANEL
BUN: 15 mg/dL (ref 6–23)
CO2: 24 mEq/L (ref 19–32)
Calcium: 9.3 mg/dL (ref 8.4–10.5)
Chloride: 102 mEq/L (ref 96–112)
Creatinine, Ser: 1 mg/dL (ref 0.4–1.5)
GFR: 81.75 mL/min (ref >60.00)
Glucose, Bld: 328 mg/dL — ABNORMAL HIGH (ref 70–99)
Potassium: 4.1 mEq/L (ref 3.5–5.1)
Sodium: 136 mEq/L (ref 135–145)

## 2012-09-11 MED ORDER — OLMESARTAN MEDOXOMIL 20 MG PO TABS: 20.0000 mg | ORAL_TABLET | Freq: Every day | ORAL | Status: DC

## 2012-09-11 NOTE — Progress Notes (Signed)
[name] Date of Birth: Mar 26, 1946 Medical Record #[id number]  History of Present Illness: [name] is seen back today for a 3 week check. He is seen for Dr. Excell Seltzer. He has chronic atrial fib, multivessel CAD with remote CABG, diastolic heart failure and recent redo cardiac surgery for severe AS. He was treated with a bioprosthetic valve. All of his bypass grafts were patent and he did not require redo CABG. He is not on anticoagulation due to GI bleeding. He has multiple medical comorbidities as outlined below and these include hepatic cirrhosis, type 2 DM, and thrombocytopenia. He has had a follow up echo showing normal function of his AV prosthesis. He is intolerant to Aldactone due to gynecomastia.   Had worsening CHF last month and had to have his Lasix increased. He was not restricting his salt. I saw him 3 weeks ago. He was doing better. Weight was down.   He comes in today. He is here alone. He is doing ok. Tries to take his Lasix twice a day but sometimes is not able due to picking up his grandson. Not weighing regularly. His wife has just had knee surgery and is going to a rehab facility today. No chest pain. Breathing is ok. No more PND/orthopnea. No swelling reported. Has trouble with his CPAP but is going to call the company in that regards. Tolerating his medicines ok. No new issues verbalized.   Current Outpatient Prescriptions on File Prior to Visit  Medication Sig Dispense Refill  . Ascorbic Acid (VITAMIN C) 1000 MG tablet Take 1,000 mg by mouth 2 (two) times daily.      [name] aspirin EC 81 MG EC tablet Take 1 tablet (81 mg total) by mouth daily.      [name] atorvastatin (LIPITOR) 20 MG tablet Take 20 mg by mouth at bedtime.       . digoxin (LANOXIN) 0.25 MG tablet TAKE 1 TABLET BY MOUTH EVERY DAY  90 tablet  1  . ferrous gluconate (FERGON) 325 MG tablet Take 325 mg by mouth 3 (three) times daily with meals.      . furosemide (LASIX) 80 MG tablet Take 1 tablet (80 mg total) by  mouth 2 (two) times daily.  60 tablet  6  . insulin detemir (LEVEMIR) 100 UNIT/ML injection Inject 50 Units into the skin daily.  10 mL  1  . insulin glargine (LANTUS) 100 UNIT/ML injection Inject 15 Units into the skin at bedtime.  10 mL  12  . metFORMIN (GLUCOPHAGE) 1000 MG tablet Take 1,000 mg by mouth 2 (two) times daily with a meal.       . metoprolol tartrate (LOPRESSOR) 50 MG tablet Take 1 tablet (50 mg total) by mouth 2 (two) times daily.  60 tablet  1  . Multiple Vitamin (MULTIVITAMIN) tablet Take 1 tablet by mouth daily.       . potassium chloride (K-DUR) 10 MEQ tablet 3 tabs am and 3 tabs pm      . verapamil (CALAN) 80 MG tablet TAKE 1 TABLET BY MOUTH 3 TIMES A DAY  270 tablet  2   No current facility-administered medications on file prior to visit.    Allergies  Allergen Reactions  . Codeine Other (See Comments)    Hurting in chest  . Diltiazem Hcl Itching  . Niacin Other (See Comments)    Hot flashes    Past Medical History  Diagnosis Date  . Overweight   . CAD (coronary artery disease)  a. s/p CABG 2004;  b. LHC 10/13:  LHC 8/13: Free radial to obtuse marginal patent, SVG-diagonal patent, LIMA-LAD patent, EF 65-70%, mean aortic valve gradient 42  . Atrial fibrillation     Permanent; off of Coumadin for now due to GI bleed  . DM2 (diabetes mellitus, type 2)   . Insomnia   . Carotid bruit 06/14/2011    a. pre-AVR dopplers 10/13: no sig ICA stenosis  . Hypertension   . Chronic diastolic heart failure   . GERD (gastroesophageal reflux disease)   . COPD (chronic obstructive pulmonary disease)   . Iron deficiency anemia     Requiring intravenous iron  . H/O hiatal hernia   . AVM (arteriovenous malformation)     Recurrent GI bleeding requiring multiple transfusions  . Hyperlipidemia   . Thrombocytopenia   . Ascites     status post paracentesis with removal of 3.4 L of ascitic fluid  . Osteoarthritis   . Mediastinal adenopathy 09/22/2011  . Cirrhosis   . Iron  deficiency anemia   . Mediastinal adenopathy 09/22/2011  . Aortic stenosis 03/08/2012    a.  s/p tissue AVR 10/13 with Dr. Dorris Fetch;   b. Echo 10/13: mod LVH, EF 55-60%, tissue AVR not well seen, no leak, gradient not too high (mean ), MAC, mild MR, mild LAE, PASP 38  . OSA (obstructive sleep apnea) 1999    DOES NOT USE CPAP    Past Surgical History  Procedure Laterality Date  . Coronary artery bypass graft   10/15/2002     [facility]. Dorris Fetch, M.D.     . Carpal tunnel release    10/08/2003  . Lipoma surgery    . Hernia repair    . Other surgical history  08/26/2011    [facility],  enteroscopy , revealing "three-four AVMs."   . Tee without cardioversion  03/07/2012    Procedure: TRANSESOPHAGEAL ECHOCARDIOGRAM (TEE);  Surgeon: Vesta Mixer, MD;  Location: [facility] ENDOSCOPY;  Service: Cardiovascular;  Laterality: N/A;  . Coronary angioplasty with stent placement  01/19/2005    drug eluting stent to high grade ostial stenosis of radial artery graft to OM  . Aortic valve replacement  04/25/2012    Procedure: AORTIC VALVE REPLACEMENT (AVR);  Surgeon: Loreli Slot, MD;  Location: [facility] OR;  Service: Open Heart Surgery;  Laterality: N/A;    History  Smoking status  . Former Smoker -- 1.00 packs/day for 30 years  . Types: Cigarettes  . Quit date: 07/25/2000  Smokeless tobacco  . Never Used    History  Alcohol Use  . 0.6 oz/week  . 1 Shots of liquor per week    Comment: Occasionally    Family History  Problem Relation Age of Onset  . Coronary artery disease      FAMILY HISTORY  . Hypertension Father   . Diabetes Father   . Heart disease Father   . COPD Sister   . Cancer Maternal Aunt     Breast cancer   . Cancer Maternal Aunt     Breast cancer   . Diabetes Son   . Cancer Daughter     Cervical cancer    Review of Systems: The review of systems is per the HPI.  All other systems were reviewed and are negative.  Physical Exam: BP 140/70  Pulse 76  Ht 5\' 8"   (1.727 m)  Wt 270 lb 1.9 oz (122.526 kg)  BMI 41.08 kg/m2 Patient is very pleasant and in no acute  distress. Looks a little chronically ill. Skin is warm and dry. Color is normal.  HEENT is unremarkable. Normocephalic/atraumatic. PERRL. Sclera are nonicteric. Neck is supple. No masses. No JVD. Lungs are clear. Cardiac exam shows an irregular rhythm. His rate is ok. Outflow murmur noted. Abdomen is obese but soft. Extremities are with trace edema. Gait and ROM are intact. No gross neurologic deficits noted.   LABORATORY DATA: BMET is pending  Lab Results  Component Value Date   WBC 7.2 08/21/2012   HGB 12.5* 08/21/2012   HCT 38.6 08/21/2012   PLT 110* 08/21/2012   GLUCOSE 170* 08/21/2012   ALT 34 08/21/2012   AST 27 08/21/2012   NA 138 08/21/2012   K 4.4 08/21/2012   CL 102 08/21/2012   CREATININE 0.9 08/21/2012   BUN 18.1 08/21/2012   CO2 24 08/21/2012   TSH 2.47 06/27/2012   INR 1.1* 06/27/2012   HGBA1C 8.7* 04/23/2012   Echo Study Conclusions from October 2013  - Left ventricle: The cavity size was normal. Wall thickness was increased in a pattern of moderate LVH. Systolic function was normal. The estimated ejection fraction was in the range of 55% to 60%. - Aortic valve: Prosthetic tissue valve not well seen No perivalvular leak. Gradient not too high - Mitral valve: Calcified annulus. Mildly thickened leaflets . Mild regurgitation. - Left atrium: The atrium was mildly dilated. - Atrial septum: No defect or patent foramen ovale was identified. - Pulmonary arteries: PA peak pressure: 38mm Hg (S).    Assessment / Plan:  1. Chronic diastolic heart failure - I am increasing his Benicar to 20 mg a day. Recheck BMET today. We will see him back in about 6 weeks. Encouraged salt restriction and weighing daily. No other medicine changes.   2. Atrial fib - managed with rate control. Not a candidate for anticoagulation due to GI bleeding/AVM's.   3. Severe AS - with recent AVR - has had his  follow up echo.   4. CAD - no chest pain reported.   I think he is doing ok overall. Will titrate his ARB today. See him back in about 6 weeks.   Patient is agreeable to this plan and will call if any problems develop in the interim.

## 2012-09-11 NOTE — Patient Instructions (Addendum)
Stay on your current medicines but increase the Benicar to a whole tablet (20mg ). I sent this to the drug store.  We will check labs today (BMET)  See Dr. Excell Seltzer in 6 weeks  Avoid salt and keep weighing  Call the [facility] office at [phone] if you have any questions, problems or concerns.

## 2012-09-18 ENCOUNTER — Telehealth: Payer: Self-pay | Admitting: Cardiovascular Disease

## 2012-09-18 NOTE — Telephone Encounter (Signed)
I spoke with the pt and made him aware of BMP results.  I forwarded a copy of results to the pt's PCP Dr Izola Price.  The pt is also looking into establishing with an endocrinologist. I made the pt aware that he needs to continue monitoring his glucose closely and if it remains elevated he will need further adjustment in his medications. Pt verbalized understanding.

## 2012-09-18 NOTE — Telephone Encounter (Signed)
New Problem:    Patient called in returning Melinda's call regarding the lab work he had from [place].  Please call back.

## 2012-09-24 ENCOUNTER — Other Ambulatory Visit: Payer: Self-pay | Admitting: *Deleted

## 2012-09-24 MED ORDER — OLMESARTAN MEDOXOMIL 20 MG PO TABS: 20.0000 mg | ORAL_TABLET | Freq: Every day | ORAL | Status: DC

## 2012-10-31 ENCOUNTER — Telehealth: Payer: Self-pay | Admitting: Oncology

## 2012-10-31 NOTE — Telephone Encounter (Signed)
s.w. pt and advised on appt d.t change to 7.14.14.Marland KitchenMarland Kitchenpt ok and aware

## 2012-11-01 ENCOUNTER — Other Ambulatory Visit: Payer: Self-pay | Admitting: Cardiovascular Disease

## 2012-11-02 ENCOUNTER — Ambulatory Visit (INDEPENDENT_AMBULATORY_CARE_PROVIDER_SITE_OTHER): Payer: Managed Care, Other (non HMO) | Admitting: Cardiovascular Disease

## 2012-11-02 ENCOUNTER — Encounter: Payer: Self-pay | Admitting: Cardiovascular Disease

## 2012-11-02 VITALS — BP 140/68 | HR 67 | Ht 68.0 in | Wt 268.1 lb

## 2012-11-02 DIAGNOSIS — I25810 Atherosclerosis of coronary artery bypass graft(s) without angina pectoris: Secondary | ICD-10-CM

## 2012-11-02 DIAGNOSIS — I350 Nonrheumatic aortic (valve) stenosis: Secondary | ICD-10-CM

## 2012-11-02 DIAGNOSIS — I359 Nonrheumatic aortic valve disorder, unspecified: Secondary | ICD-10-CM

## 2012-11-02 MED ORDER — POTASSIUM CHLORIDE ER 10 MEQ PO TBCR: 30.0000 meq | EXTENDED_RELEASE_TABLET | Freq: Two times a day (BID) | ORAL | Status: DC

## 2012-11-02 NOTE — Progress Notes (Signed)
HPI:  67 year old gentleman presenting for followup evaluation. The patient has an extensive cardiac history that includes coronary artery disease status post CABG, permanent atrial fibrillation, diastolic heart failure, and redo cardiac surgery with aortic valve replacement for treatment of severe aortic stenosis.  He remains limited by exertional dyspnea. He is inactive. He's overeating and not watching his diet carefully at all. He denies chest pain or pressure, lightheadedness, leg swelling, orthopnea, PND, or syncope. He is compliant with his medical program.  Outpatient Encounter Prescriptions as of 11/02/2012  Medication Sig Dispense Refill  . Ascorbic Acid (VITAMIN C) 1000 MG tablet Take 1,000 mg by mouth 2 (two) times daily.      [name] aspirin EC 81 MG EC tablet Take 1 tablet (81 mg total) by mouth daily.      [name] atorvastatin (LIPITOR) 20 MG tablet Take 20 mg by mouth at bedtime.       . digoxin (LANOXIN) 0.25 MG tablet TAKE 1 TABLET BY MOUTH EVERY DAY  90 tablet  1  . ferrous gluconate (FERGON) 325 MG tablet Take 325 mg by mouth 3 (three) times daily with meals.      . furosemide (LASIX) 80 MG tablet Take 1 tablet (80 mg total) by mouth 2 (two) times daily.  60 tablet  6  . insulin detemir (LEVEMIR) 100 UNIT/ML injection Inject 50 Units into the skin daily.  10 mL  1  . insulin glargine (LANTUS) 100 UNIT/ML injection Inject 15 Units into the skin at bedtime.  10 mL  12  . KLOR-CON M10 10 MEQ tablet TAKE 3 TABLETS BY MOUTH TWICE A DAY  180 tablet  6  . metFORMIN (GLUCOPHAGE) 1000 MG tablet Take 1,000 mg by mouth 2 (two) times daily with a meal.       . metoprolol tartrate (LOPRESSOR) 50 MG tablet Take 1 tablet (50 mg total) by mouth 2 (two) times daily.  60 tablet  1  . Multiple Vitamin (MULTIVITAMIN) tablet Take 1 tablet by mouth daily.       [name] olmesartan (BENICAR) 20 MG tablet Take 1 tablet (20 mg total) by mouth daily.  90 tablet  2  . omeprazole (PRILOSEC) 20 MG capsule 2 (two) times  daily.      . potassium chloride (K-DUR) 10 MEQ tablet Take 3 tablets (30 mEq total) by mouth 2 (two) times daily.  540 tablet  3  . verapamil (CALAN) 80 MG tablet TAKE 1 TABLET BY MOUTH 3 TIMES A DAY  270 tablet  2  . [DISCONTINUED] potassium chloride (K-DUR) 10 MEQ tablet 3 tabs am and 3 tabs pm       No facility-administered encounter medications on file as of 11/02/2012.    Allergies  Allergen Reactions  . Codeine Other (See Comments)    Hurting in chest  . Diltiazem Hcl Itching  . Niacin Other (See Comments)    Hot flashes    Past Medical History  Diagnosis Date  . Overweight   . CAD (coronary artery disease)     a. s/p CABG 2004;  b. LHC 10/13:  LHC 8/13: Free radial to obtuse marginal patent, SVG-diagonal patent, LIMA-LAD patent, EF 65-70%, mean aortic valve gradient 42  . Atrial fibrillation     Permanent; off of Coumadin for now due to GI bleed  . DM2 (diabetes mellitus, type 2)   . Insomnia   . Carotid bruit 06/14/2011    a. pre-AVR dopplers 10/13: no sig ICA stenosis  . Hypertension   .  Chronic diastolic heart failure   . GERD (gastroesophageal reflux disease)   . COPD (chronic obstructive pulmonary disease)   . Iron deficiency anemia     Requiring intravenous iron  . H/O hiatal hernia   . AVM (arteriovenous malformation)     Recurrent GI bleeding requiring multiple transfusions  . Hyperlipidemia   . Thrombocytopenia   . Ascites     status post paracentesis with removal of 3.4 L of ascitic fluid  . Osteoarthritis   . Mediastinal adenopathy 09/22/2011  . Cirrhosis   . Iron deficiency anemia   . Mediastinal adenopathy 09/22/2011  . Aortic stenosis 03/08/2012    a.  s/p tissue AVR 10/13 with Dr. Dorris Fetch;   b. Echo 10/13: mod LVH, EF 55-60%, tissue AVR not well seen, no leak, gradient not too high (mean ), MAC, mild MR, mild LAE, PASP 38  . OSA (obstructive sleep apnea) 1999    DOES NOT USE CPAP    ROS: Negative except as per HPI  BP 140/68  Pulse  67  Ht 5\' 8"  (1.727 m)  Wt 121.618 kg (268 lb 1.9 oz)  BMI 40.78 kg/m2  PHYSICAL EXAM: Pt is alert and oriented, pleasant obese male in NAD HEENT: normal Neck: JVP - normal, carotids 2+= without bruits Lungs: CTA bilaterally CV: Irregular with grade 2/6 systolic murmur at the right upper sternal border Abd: soft, NT, Positive BS, no hepatomegaly, obese Ext: no C/C/E, distal pulses intact and equal Skin: warm/dry no rash  EKG:  Atrial fibrillation 67 beats per minute, nonspecific T wave abnormality.  ASSESSMENT AND PLAN: 1. Permanent atrial fibrillation. His heart rate is well controlled. He is unable to take any anticoagulant drugs because of history of recurrent GI bleeding. He does tolerate aspirin 81 mg. Continue digoxin and Lopressor for rate control.  2. Chronic diastolic heart failure. The patient remains limited by exertional dyspnea. He has no peripheral edema. We had a lengthy discussion today regarding the importance of diet and exercise. His weight is a major problem with a BMI greater than 40. I encouraged him to get very serious about making major dietary changes. He has not improved symptomatically very much since his aortic valve surgery, and I suspect this is primarily due to deconditioning and obesity. I do not think he needs any changes made in his diuretic regimen.  3. CAD status post CABG. Stable without anginal symptoms.  4. History of severe aortic stenosis now status post bioprosthetic aortic valve replacement. Valve was functioning normally by a followup echocardiogram. His exam is stable.  For followup, I would like to see him back in 4 months.  Tonny Bollman 11/02/2012 6:02 PM

## 2012-11-02 NOTE — Patient Instructions (Addendum)
Your physician wants you to follow-up in: 4 MONTHS with Dr Excell Seltzer.  You will receive a reminder letter in the mail two months in advance. If you don't receive a letter, please call our office to schedule the follow-up appointment.  Your physician recommends that you return for a FASTING LIPID, LIVER and BMP in 4 MONTHS--nothing to eat or drink after midnight, lab opens at 7:30  Your physician recommends that you continue on your current medications as directed. Please refer to the Current Medication list given to you today.

## 2012-11-19 ENCOUNTER — Other Ambulatory Visit (HOSPITAL_BASED_OUTPATIENT_CLINIC_OR_DEPARTMENT_OTHER): Payer: Managed Care, Other (non HMO) | Admitting: Lab

## 2012-11-19 ENCOUNTER — Telehealth: Payer: Self-pay | Admitting: *Deleted

## 2012-11-19 DIAGNOSIS — R590 Localized enlarged lymph nodes: Secondary | ICD-10-CM

## 2012-11-19 DIAGNOSIS — R599 Enlarged lymph nodes, unspecified: Secondary | ICD-10-CM

## 2012-11-19 DIAGNOSIS — D649 Anemia, unspecified: Secondary | ICD-10-CM

## 2012-11-19 LAB — CBC WITH DIFFERENTIAL/PLATELET
BASO%: 1.3 % (ref 0.0–2.0)
Eosinophils Absolute: 0.2 10*3/uL (ref 0.0–0.5)
MCV: 83.2 fL (ref 79.3–98.0)
MONO%: 7.8 % (ref 0.0–14.0)
NEUT#: 3.8 10*3/uL (ref 1.5–6.5)
RBC: 4.91 10*6/uL (ref 4.20–5.82)
RDW: 16.9 % — ABNORMAL HIGH (ref 11.0–14.6)
WBC: 5.5 10*3/uL (ref 4.0–10.3)

## 2012-11-19 NOTE — Telephone Encounter (Signed)
Message copied by Wende Mott on Mon Nov 19, 2012  2:40 PM ------      Message from: Clenton Pare R      Created: Mon Nov 19, 2012  1:54 PM       Call pt. Hgb is now normal. Other counts stable. Continue observation. ------

## 2012-11-19 NOTE — Telephone Encounter (Signed)
Called pt and informed of Hgb normal per Belenda Cruise.  Keep appt as scheduled in July.  He verbalized understanding.

## 2012-11-28 ENCOUNTER — Other Ambulatory Visit: Payer: Self-pay | Admitting: Cardiovascular Disease

## 2013-01-08 ENCOUNTER — Ambulatory Visit (INDEPENDENT_AMBULATORY_CARE_PROVIDER_SITE_OTHER): Payer: Managed Care, Other (non HMO) | Admitting: Internal Medicine

## 2013-01-31 ENCOUNTER — Encounter (INDEPENDENT_AMBULATORY_CARE_PROVIDER_SITE_OTHER): Payer: Self-pay | Admitting: Internal Medicine

## 2013-01-31 ENCOUNTER — Ambulatory Visit (INDEPENDENT_AMBULATORY_CARE_PROVIDER_SITE_OTHER): Payer: Managed Care, Other (non HMO) | Admitting: Internal Medicine

## 2013-01-31 VITALS — BP 140/82 | HR 84 | Temp 97.6°F | Resp 18 | Ht 69.0 in | Wt 277.1 lb

## 2013-01-31 DIAGNOSIS — R188 Other ascites: Secondary | ICD-10-CM

## 2013-01-31 DIAGNOSIS — K7460 Unspecified cirrhosis of liver: Secondary | ICD-10-CM

## 2013-01-31 NOTE — Patient Instructions (Signed)
Physician will contact you with results of blood work and ultrasound when completed. 

## 2013-01-31 NOTE — Progress Notes (Signed)
Presenting complaint;  Followup for cirrhosis and ascites.  Subjective:  Patient is 67 year old Caucasian male who is here for scheduled visit accompanied by his wife. 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 Since his last visit he has gained 15 pounds. His wife states he does not watch what he eats. He complains of abdominal tightness and feels his fluid his back. He also complains of shortness of breath when walking less than half a block. His bowels move daily. His stools are dark since he is on iron. He denies rectal bleeding. According to his wife he has not had an episode of confusion. He is due for his blood work prior to his visit with Dr. Gaylyn Rong.  Current Medications: Current Outpatient Prescriptions  Medication Sig Dispense Refill  . Ascorbic Acid (VITAMIN C) 1000 MG tablet Take 1,000 mg by mouth 2 (two) times daily.      [name] aspirin EC 81 MG EC tablet Take 1 tablet (81 mg total) by mouth daily.      [name] atorvastatin (LIPITOR) 20 MG tablet Take 20 mg by mouth at bedtime.       [name] DIGOX 250 MCG tablet TAKE 1 TABLET EVERY DAY  90 tablet  1  . ferrous gluconate (FERGON) 325 MG tablet Take 325 mg by mouth 3 (three) times daily with meals.      . furosemide (LASIX) 80 MG tablet Take 1 tablet (80 mg total) by mouth 2 (two) times daily.  60 tablet  6  . insulin glargine (LANTUS) 100 UNIT/ML injection Inject 15 Units into the skin at bedtime.  10 mL  12  . Insulin Lispro, Human, (HUMALOG [place]) Inject into the skin. Patient states that this on a sliding scale      . KLOR-CON M10 10 MEQ tablet TAKE 3 TABLETS BY MOUTH TWICE A DAY  180 tablet  6  . metFORMIN (GLUCOPHAGE) 1000 MG tablet Take 1,000 mg by mouth 2 (two) times daily with a meal.       . metoprolol tartrate (LOPRESSOR) 50 MG tablet Take 1 tablet (50 mg total) by mouth 2 (two) times daily.  60 tablet  1  . Multiple Vitamin (MULTIVITAMIN) tablet Take 1 tablet by mouth daily.       [name] olmesartan (BENICAR) 20 MG tablet Take 1  tablet (20 mg total) by mouth daily.  90 tablet  2  . omeprazole (PRILOSEC) 20 MG capsule 2 (two) times daily.      . potassium chloride (K-DUR) 10 MEQ tablet Take 3 tablets (30 mEq total) by mouth 2 (two) times daily.  540 tablet  3  . verapamil (CALAN) 80 MG tablet TAKE 1 TABLET BY MOUTH 3 TIMES A DAY  270 tablet  2   No current facility-administered medications for this visit.     Objective: Blood pressure 140/82, pulse 84, temperature 97.6 F (36.4 C), temperature source Oral, resp. rate 18, height 5\' 9"  (1.753 m), weight 277 lb 1.6 oz (125.692 kg). Patient is alert and in no acute distress. He does not have asterixis. Conjunctiva is pink. Sclera is nonicteric Oropharyngeal mucosa is normal. No neck masses or thyromegaly noted. Cardiac exam with regular rhythm normal S1 and S2. He has grade 2/6 systolic ejection murmur best heard at aortic area. Lungs are clear to auscultation. Abdomen is obese soft and nontender. Liver edge is indistinct the left lobe is easily palpable. Spleen is not palpable. Shifting dullness is absent. He has trace edema around his ankles.  Labs/studies Results: CBC from 11/19/2012 W. BC 5.5, H&H 14 and 40.9 and platelet count 111K. LFTs normal on 08/21/2012; albumin was 3.9   Assessment:  #1. 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 He has gained 15 pounds since his last visit of December 2013. On exam he does not appear to have significant ascites but this issue can be easily resolved with ultrasound which is needed for [facility] screening. Patient must watch calorie intake and lose weight otherwise he would end up with progressive liver disease which would shorten his life. #2. History of iron deficiency anemia. Last H&H was normal. He is due for blood work prior to seeing Dr. Gaylyn Rong; iron could be stopped or dose reduced outwardly with Dr. Dr. Gaylyn Rong.    Plan:  Hepatobiliary ultrasound. If he has significant ascites it could be removed at the  time. Alpha-fetoprotein. Office visit in 6 months

## 2013-02-04 ENCOUNTER — Ambulatory Visit (HOSPITAL_BASED_OUTPATIENT_CLINIC_OR_DEPARTMENT_OTHER): Payer: Managed Care, Other (non HMO) | Admitting: Oncology

## 2013-02-04 ENCOUNTER — Telehealth: Payer: Self-pay | Admitting: Oncology

## 2013-02-04 ENCOUNTER — Other Ambulatory Visit (HOSPITAL_BASED_OUTPATIENT_CLINIC_OR_DEPARTMENT_OTHER): Payer: Managed Care, Other (non HMO) | Admitting: Lab

## 2013-02-04 VITALS — BP 119/74 | HR 62 | Temp 97.1°F | Resp 19 | Ht 69.0 in | Wt 278.3 lb

## 2013-02-04 DIAGNOSIS — D509 Iron deficiency anemia, unspecified: Secondary | ICD-10-CM

## 2013-02-04 DIAGNOSIS — D649 Anemia, unspecified: Secondary | ICD-10-CM

## 2013-02-04 DIAGNOSIS — R590 Localized enlarged lymph nodes: Secondary | ICD-10-CM

## 2013-02-04 LAB — COMPREHENSIVE METABOLIC PANEL (CC13)
ALT: 39 U/L (ref 0–55)
Albumin: 3.9 g/dL (ref 3.5–5.0)
CO2: 27 mEq/L (ref 22–29)
Calcium: 9.6 mg/dL (ref 8.4–10.4)
Chloride: 101 mEq/L (ref 98–109)
Glucose: 234 mg/dl — ABNORMAL HIGH (ref 70–140)
Potassium: 4.9 mEq/L (ref 3.5–5.1)
Sodium: 138 mEq/L (ref 136–145)
Total Protein: 7.7 g/dL (ref 6.4–8.3)

## 2013-02-04 LAB — CBC WITH DIFFERENTIAL/PLATELET
Eosinophils Absolute: 0.2 10*3/uL (ref 0.0–0.5)
LYMPH%: 15.6 % (ref 14.0–49.0)
MONO#: 0.7 10*3/uL (ref 0.1–0.9)
NEUT#: 5.1 10*3/uL (ref 1.5–6.5)
Platelets: 116 10*3/uL — ABNORMAL LOW (ref 140–400)
RBC: 4.28 10*6/uL (ref 4.20–5.82)
RDW: 17.4 % — ABNORMAL HIGH (ref 11.0–14.6)
WBC: 7.1 10*3/uL (ref 4.0–10.3)
lymph#: 1.1 10*3/uL (ref 0.9–3.3)

## 2013-02-04 NOTE — Progress Notes (Signed)
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   CURRENT THERAPY: oral and IV iron.   INTERVAL HISTORY: [name] 67 y.o. male returns for regular follow up with his wife.  He reports that he has been having reversal of his sleep-wake cycle.  He feels tired during the day.  He also has increasing abdominal girth.  His GI physician is planning for abdominal US and possible therapeutic paracentesis in the next few weeks.  He is taking oral iron 1.5 tabllet PO BID without abdominal pain or constipation. He reported one episode of hematochezia about 2 days ago which has not recurred.   Patient denies fever, anorexia, weight loss, headache, visual changes, confusion, drenching night sweats, palpable lymph node swelling, mucositis, odynophagia, dysphagia, nausea vomiting, jaundice, chest pain, palpitation, shortness of breath, dyspnea on exertion, productive cough, gum bleeding, epistaxis, hematemesis, hemoptysis, abdominal pain, early satiety, melena, hematuria, skin rash, spontaneous bleeding, joint swelling, joint pain, heat or cold intolerance, bowel bladder incontinence, back pain, focal motor weakness, paresthesia, depression.     Past Medical History  Diagnosis Date  . Overweight(278.02)   . CAD (coronary artery disease)     a. s/p CABG 2004;  b. LHC 10/13:  LHC 8/13: Free radial to obtuse marginal patent, SVG-diagonal patent, LIMA-LAD patent, EF 65-70%, mean aortic valve gradient 42  . Atrial fibrillation     Permanent; off of Coumadin for now due to GI bleed  . DM2 (diabetes mellitus, type 2)   . Insomnia   . Carotid bruit 06/14/2011    a. pre-AVR dopplers 10/13: no sig ICA stenosis  . Hypertension   . Chronic diastolic heart failure   . GERD (gastroesophageal reflux disease)   . COPD (chronic obstructive pulmonary  disease)   . Iron deficiency anemia     Requiring intravenous iron  . H/O hiatal hernia   . AVM (arteriovenous malformation)     Recurrent GI bleeding requiring multiple transfusions  . Hyperlipidemia   . Thrombocytopenia   . Ascites     status post paracentesis with removal of 3.4 L of ascitic fluid  . Osteoarthritis   . Mediastinal adenopathy 09/22/2011  . Cirrhosis   . Iron deficiency anemia   . Mediastinal adenopathy 09/22/2011  . Aortic stenosis 03/08/2012    a.  s/p tissue AVR 10/13 with Dr. Dorris Fetch;   b. Echo 10/13: mod LVH, EF 55-60%, tissue AVR not well seen, no leak, gradient not too high (mean ), MAC, mild MR, mild LAE, PASP 38  . OSA (obstructive sleep apnea) 1999    DOES NOT USE CPAP    Past Surgical History  Procedure Laterality Date  . Coronary artery bypass graft   10/15/2002     [facility] Decent. Dorris Fetch, M.D.     . Carpal tunnel release    10/08/2003  . Lipoma surgery    . Hernia repair    . Other surgical history  08/26/2011    [facility],  enteroscopy , revealing "three-four AVMs."   . Tee without cardioversion  03/07/2012    Procedure: TRANSESOPHAGEAL ECHOCARDIOGRAM (TEE);  Surgeon: Vesta Mixer, MD;  Location: [facility] ENDOSCOPY;  Service: Cardiovascular;  Laterality: N/A;  . Coronary angioplasty with stent placement  01/19/2005    drug eluting stent to high grade ostial stenosis of radial artery graft to OM  . Aortic valve  replacement  04/25/2012    Procedure: AORTIC VALVE REPLACEMENT (AVR);  Surgeon: Loreli Slot, MD;  Location: [facility] OR;  Service: Open Heart Surgery;  Laterality: N/A;    Current Outpatient Prescriptions  Medication Sig Dispense Refill  . Ascorbic Acid (VITAMIN C) 1000 MG tablet Take 1,000 mg by mouth 2 (two) times daily.      [name] aspirin EC 81 MG EC tablet Take 1 tablet (81 mg total) by mouth daily.      [name] atorvastatin (LIPITOR) 20 MG tablet Take 20 mg by mouth at bedtime.       [name] DIGOX 250 MCG tablet TAKE 1 TABLET EVERY DAY  90 tablet   1  . ferrous gluconate (FERGON) 325 MG tablet Take 325 mg by mouth 3 (three) times daily with meals.      . furosemide (LASIX) 80 MG tablet Take 1 tablet (80 mg total) by mouth 2 (two) times daily.  60 tablet  6  . insulin glargine (LANTUS) 100 UNIT/ML injection Inject 50 Units into the skin at bedtime.      . Insulin Lispro, Human, (HUMALOG ) Inject into the skin. Patient states that this on a sliding scale      . KLOR-CON M10 10 MEQ tablet TAKE 3 TABLETS BY MOUTH TWICE A DAY  180 tablet  6  . metFORMIN (GLUCOPHAGE) 1000 MG tablet Take 1,000 mg by mouth 2 (two) times daily with a meal.       . metoprolol tartrate (LOPRESSOR) 50 MG tablet Take 1 tablet (50 mg total) by mouth 2 (two) times daily.  60 tablet  1  . Multiple Vitamin (MULTIVITAMIN) tablet Take 1 tablet by mouth daily.       [name] olmesartan (BENICAR) 20 MG tablet Take 1 tablet (20 mg total) by mouth daily.  90 tablet  2  . omeprazole (PRILOSEC) 20 MG capsule 2 (two) times daily.      . verapamil (CALAN) 80 MG tablet TAKE 1 TABLET BY MOUTH 3 TIMES A DAY  270 tablet  2   No current facility-administered medications for this visit.    ALLERGIES:  is allergic to codeine; diltiazem hcl; and niacin.  REVIEW OF SYSTEMS:  The rest of the 14-point review of system was negative.   Filed Vitals:   02/04/13 1001  BP: 119/74  Pulse: 62  Temp: 97.1 F (36.2 C)  Resp: 19   Wt Readings from Last 3 Encounters:  02/04/13 278 lb 4.8 oz (126.236 kg)  01/31/13 277 lb 1.6 oz (125.692 kg)  11/02/12 268 lb 1.9 oz (121.618 kg)   ECOG Performance status: 1-2  PHYSICAL EXAMINATION:   General:  Obese man, in no acute distress.  Eyes:  no scleral icterus.  ENT:  There were no oropharyngeal lesions.  Neck was without thyromegaly.  Lymphatics:  Negative cervical, supraclavicular or axillary adenopathy.  Respiratory: lungs were clear bilaterally without wheezing or crackles.  Cardiovascular:  Irregularly irregular, S1/S2, without murmur, rub or  gallop.  There was no pedal edema.  GI:  abdomen was soft, nontender, nondistended, without organomegaly.  It was mildly distended with positive fluid wave.  Muscoloskeletal:  no spinal tenderness of palpation of vertebral spine.  Skin exam was without echymosis, petichae.  Neuro exam was nonfocal.  Patient was able to get on and off exam table without assistance.  Gait was normal.  Patient was alert and oriented.  Attention was good.   Language was appropriate.  Mood was normal without depression.  Speech was not pressured.  Thought content was not tangential.     LABORATORY/RADIOLOGY DATA:  Lab Results  Component Value Date   WBC 7.1 02/04/2013   HGB 11.9* 02/04/2013   HCT 36.8* 02/04/2013   PLT 116* 02/04/2013   GLUCOSE 234* 02/04/2013   ALKPHOS 78 02/04/2013   ALT 39 02/04/2013   AST 37* 02/04/2013   NA 138 02/04/2013   K 4.9 02/04/2013   CL 102 09/11/2012   CREATININE 1.1 02/04/2013   BUN 12.1 02/04/2013   CO2 27 02/04/2013   INR 1.1* 06/27/2012   HGBA1C 8.7* 04/23/2012    ASSESSMENT AND PLAN:  1. HTN: under good control with Verapamil, olmesartan, Metoprolol, and Lasix per PCP.  2. DM, type II: Under good control with Metformin and Lantus per PCP.  3. HLP: He is on Lipitor per PCP.  4. Afib: He is rate controlled on metoprolol and verapamil. He is not on  5. CAD: He is in ASA, Imdur, Lisinopril, metoprolol, Lipitor.  6. CHF: He is on Lisinopril, metoprolol, Lasix, Digoxin.  7. AVM: Extensive work up by GI.  8. Chronic microcytic anemia of iron deficiency:  We may consider repeat IV iron if his iron panel shows iron deficiency.  I advised him to decrease his oral intake of 1.5 tab PO BID down to 1tab PO BID.  9. 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  His plate is stable. There is no active bleeding.  There is no indication for transfusion.  10. Mediastinal adenopathy: Most likely reactive. They are still <2cm. Compared to 2007 and 2008, there was only slight  increase. His last CT chest in August 2013 showed stability.  11. Ascites: due to cirrhosis.  Management per GI.  12. Reversal of sleep/wake cycle:  Due to cirrhosis.  Management per GI.  13. Follow up: Lab only appointment in about 4 and 8 months.  Return visit in about 1 year.    I informed Mr. Derusha and his wife that I am leaving the practice.  [facility] will [facility] for him to see another provider when he returns.     The length of time of the face-to-face encounter was 15 minutes. More than 50% of time was spent counseling and coordination of care.

## 2013-02-04 NOTE — Telephone Encounter (Signed)
gv and printed appt sched and avs for pt  °

## 2013-02-05 ENCOUNTER — Other Ambulatory Visit (INDEPENDENT_AMBULATORY_CARE_PROVIDER_SITE_OTHER): Payer: Self-pay | Admitting: Internal Medicine

## 2013-02-05 DIAGNOSIS — R188 Other ascites: Secondary | ICD-10-CM

## 2013-02-05 DIAGNOSIS — K7460 Unspecified cirrhosis of liver: Secondary | ICD-10-CM

## 2013-02-06 ENCOUNTER — Ambulatory Visit (HOSPITAL_COMMUNITY)
Admission: RE | Admit: 2013-02-06 | Discharge: 2013-02-06 | Disposition: A | Discharge status: Home / Self Care | Payer: Managed Care, Other (non HMO) | Source: Ambulatory Visit | Attending: Internal Medicine | Admitting: Internal Medicine

## 2013-02-06 DIAGNOSIS — K7460 Unspecified cirrhosis of liver: Secondary | ICD-10-CM | POA: Insufficient documentation

## 2013-02-06 DIAGNOSIS — R161 Splenomegaly, not elsewhere classified: Secondary | ICD-10-CM | POA: Insufficient documentation

## 2013-02-06 DIAGNOSIS — R188 Other ascites: Secondary | ICD-10-CM

## 2013-02-06 DIAGNOSIS — R932 Abnormal findings on diagnostic imaging of liver and biliary tract: Secondary | ICD-10-CM | POA: Insufficient documentation

## 2013-02-18 ENCOUNTER — Ambulatory Visit: Payer: Managed Care, Other (non HMO) | Admitting: Oncology

## 2013-02-18 ENCOUNTER — Other Ambulatory Visit: Payer: Managed Care, Other (non HMO) | Admitting: Lab

## 2013-03-01 ENCOUNTER — Other Ambulatory Visit (INDEPENDENT_AMBULATORY_CARE_PROVIDER_SITE_OTHER): Payer: Managed Care, Other (non HMO)

## 2013-03-01 DIAGNOSIS — I350 Nonrheumatic aortic (valve) stenosis: Secondary | ICD-10-CM

## 2013-03-01 DIAGNOSIS — I25810 Atherosclerosis of coronary artery bypass graft(s) without angina pectoris: Secondary | ICD-10-CM

## 2013-03-01 DIAGNOSIS — I359 Nonrheumatic aortic valve disorder, unspecified: Secondary | ICD-10-CM

## 2013-03-01 LAB — HEPATIC FUNCTION PANEL
Albumin: 3.9 g/dL (ref 3.5–5.2)
Total Bilirubin: 0.8 mg/dL (ref 0.3–1.2)

## 2013-03-01 LAB — BASIC METABOLIC PANEL
CO2: 26 mEq/L (ref 19–32)
Chloride: 102 mEq/L (ref 96–112)
Glucose, Bld: 243 mg/dL — ABNORMAL HIGH (ref 70–99)
Potassium: 4.1 mEq/L (ref 3.5–5.1)
Sodium: 137 mEq/L (ref 135–145)

## 2013-03-01 LAB — LIPID PANEL
HDL: 30.7 mg/dL — ABNORMAL LOW (ref >39.00)
LDL Cholesterol: 38 mg/dL (ref 0–99)
Total CHOL/HDL Ratio: 3
VLDL: 31.4 mg/dL (ref 0.0–40.0)

## 2013-03-06 ENCOUNTER — Ambulatory Visit (INDEPENDENT_AMBULATORY_CARE_PROVIDER_SITE_OTHER): Payer: Managed Care, Other (non HMO) | Admitting: Cardiovascular Disease

## 2013-03-06 ENCOUNTER — Encounter: Payer: Self-pay | Admitting: Cardiovascular Disease

## 2013-03-06 VITALS — BP 128/80 | HR 61 | Ht 69.0 in | Wt 281.0 lb

## 2013-03-06 DIAGNOSIS — I359 Nonrheumatic aortic valve disorder, unspecified: Secondary | ICD-10-CM

## 2013-03-06 DIAGNOSIS — I2510 Atherosclerotic heart disease of native coronary artery without angina pectoris: Secondary | ICD-10-CM

## 2013-03-06 DIAGNOSIS — R0602 Shortness of breath: Secondary | ICD-10-CM

## 2013-03-06 NOTE — Patient Instructions (Addendum)
Your physician has requested that you have an echocardiogram. Echocardiography is a painless test that uses sound waves to create images of your heart. It provides your doctor with information about the size and shape of your heart and how well your heart's chambers and valves are working. This procedure takes approximately one hour. There are no restrictions for this procedure.  Your physician recommends that you have lab work today: BNP  Your physician recommends that you schedule a follow-up appointment in: 4 MONTHS with Dr Excell Seltzer  Your physician recommends that you continue on your current medications as directed. Please refer to the Current Medication list given to you today.    You can take Tylenol PM as needed for sleep.

## 2013-03-06 NOTE — Progress Notes (Signed)
HPI:   66 year old gentleman presenting for followup evaluation. The patient has an extensive cardiac history that includes coronary artery disease status post CABG, permanent atrial fibrillation, diastolic heart failure, and redo cardiac surgery with aortic valve replacement for treatment of severe aortic stenosis.  The patient continues to complain of shortness of breath. His diabetes medications and then adjusted and he has gained more weight. When I saw him last he was 268 pounds and he is up to 281 pounds today. He denies any changes in his diet or lifestyle. He thinks this is medication related.  He is short of breath with minimal activity. He denies chest pain or pressure. He does feel something "bubbling around" in his chest. He denies leg swelling. He denies lightheadedness or syncope.  Outpatient Encounter Prescriptions as of 03/06/2013  Medication Sig Dispense Refill  . Ascorbic Acid (VITAMIN C) 1000 MG tablet Take 1,000 mg by mouth 2 (two) times daily.      [name] aspirin EC 81 MG EC tablet Take 1 tablet (81 mg total) by mouth daily.      [name] atorvastatin (LIPITOR) 20 MG tablet Take 20 mg by mouth at bedtime.       [name] DIGOX 250 MCG tablet TAKE 1 TABLET EVERY DAY  90 tablet  1  . ferrous gluconate (FERGON) 325 MG tablet Take 325 mg by mouth 2 (two) times daily.       . furosemide (LASIX) 80 MG tablet Take 1 tablet (80 mg total) by mouth 2 (two) times daily.  60 tablet  6  . Insulin Lispro, Human, (HUMALOG [place]) Inject into the skin. Patient states that this on a sliding scale      . KLOR-CON M10 10 MEQ tablet TAKE 3 TABLETS BY MOUTH TWICE A DAY  180 tablet  6  . metFORMIN (GLUCOPHAGE) 1000 MG tablet Take 1,000 mg by mouth 2 (two) times daily with a meal.       . metoprolol tartrate (LOPRESSOR) 50 MG tablet Take 1 tablet (50 mg total) by mouth 2 (two) times daily.  60 tablet  1  . Multiple Vitamin (MULTIVITAMIN) tablet Take 1 tablet by mouth daily.       [name] olmesartan (BENICAR) 20 MG tablet  Take 1 tablet (20 mg total) by mouth daily.  90 tablet  2  . omeprazole (PRILOSEC) 20 MG capsule 2 (two) times daily.      . verapamil (CALAN) 80 MG tablet TAKE 1 TABLET BY MOUTH 3 TIMES A DAY  270 tablet  2  . [DISCONTINUED] insulin glargine (LANTUS) 100 UNIT/ML injection Inject 50 Units into the skin at bedtime.       No facility-administered encounter medications on file as of 03/06/2013.    Allergies  Allergen Reactions  . Codeine Other (See Comments)    Hurting in chest  . Diltiazem Hcl Itching  . Niacin Other (See Comments)    Hot flashes    Past Medical History  Diagnosis Date  . Overweight(278.02)   . CAD (coronary artery disease)     a. s/p CABG 2004;  b. LHC 10/13:  LHC 8/13: Free radial to obtuse marginal patent, SVG-diagonal patent, LIMA-LAD patent, EF 65-70%, mean aortic valve gradient 42  . Atrial fibrillation     Permanent; off of Coumadin for now due to GI bleed  . DM2 (diabetes mellitus, type 2)   . Insomnia   . Carotid bruit 06/14/2011    a. pre-AVR dopplers 10/13: no sig ICA stenosis  .  Hypertension   . Chronic diastolic heart failure   . GERD (gastroesophageal reflux disease)   . COPD (chronic obstructive pulmonary disease)   . Iron deficiency anemia     Requiring intravenous iron  . H/O hiatal hernia   . AVM (arteriovenous malformation)     Recurrent GI bleeding requiring multiple transfusions  . Hyperlipidemia   . Thrombocytopenia   . Ascites     status post paracentesis with removal of 3.4 L of ascitic fluid  . Osteoarthritis   . Mediastinal adenopathy 09/22/2011  . Cirrhosis   . Iron deficiency anemia   . Mediastinal adenopathy 09/22/2011  . Aortic stenosis 03/08/2012    a.  s/p tissue AVR 10/13 with Dr. Dorris Fetch;   b. Echo 10/13: mod LVH, EF 55-60%, tissue AVR not well seen, no leak, gradient not too high (mean ), MAC, mild MR, mild LAE, PASP 38  . OSA (obstructive sleep apnea) 1999    DOES NOT USE CPAP    ROS: Positive for insomnia,  otherwise negative except as per HPI  BP 128/80  Pulse 61  Ht 5\' 9"  (1.753 m)  Wt 127.461 kg (281 lb)  BMI 41.48 kg/m2  SpO2 96%  PHYSICAL EXAM: Pt is alert and oriented, pleasant obese male in NAD HEENT: normal Neck: JVP - moderately elevated, carotids 2+= without bruits Lungs: CTA bilaterally CV: RRR with grade 2/6 systolic murmur at the left lower sternal border Abd: soft, NT, Positive BS, obese Ext: no C/C/E, distal pulses intact and equal Skin: warm/dry no rash  ASSESSMENT AND PLAN: 1. Chronic diastolic heart failure. Complex situation in this gentleman with nonalcoholic cirrhosis, aortic valve disease status post aortic valve replacement, coronary disease, and morbid obesity. He did not have much ascites on a recent ultrasound that I have reviewed. His postoperative echo looked pretty good with preserved ejection fraction and normal bioprosthetic valve function. I reviewed recent labs that showed normal renal function. I have recommended that we check a BNP and repeat an echocardiogram. The patient is markedly limited by his breathing. We could consider a right and left heart catheterization. I would like to review his noninvasive studies first. We discussed the fact that poor sleep and obesity are probably playing a very significant role in his symptoms.  2. Coronary artery disease, native vessel. No anginal symptoms. The patient's on aspirin for antiplatelet therapy. He is on atorvastatin for lipid lowering. Continue routine followup.  3. Aortic valve disease status post bioprosthetic aortic valve replacement. Repeat echocardiogram to assess valve function. His exam is unchanged.  4. Hypertension. Blood pressure is controlled. We'll continue same therapy. For followup I will see him back in 4 months.  Tonny Bollman 03/06/2013 1:22 PM

## 2013-03-07 ENCOUNTER — Telehealth: Payer: Self-pay | Admitting: Cardiovascular Disease

## 2013-03-07 NOTE — Telephone Encounter (Signed)
i think unlikely that this is related to Benicar. If other etiologies ruled out would be reasonable to hold benicar for a week to see if any change noted.  Tonny Bollman 03/07/2013 5:59 PM

## 2013-03-07 NOTE — Telephone Encounter (Signed)
In reviewing the pt's chart Benicar was started 06/27/12 by Dr Sherene Sires when the pt's ACE was discontinued. I spoke with the pt and he states his diarrhea has been ongoing for a "good while" and he is unsure of the exact time that symptoms started. The pt said his last colonoscopy was performed by Dr [name] in 2012 but he did not follow-up up with Dr [name] due to a disagreement. The pt has since seen a GI doctor (Dr Gerilyn Nestle). I advised the pt to contact Dr Lynden Ang office and arrange an appointment for further evaluation of diarrhea.  I will forward this message to Dr Excell Seltzer for any recommendations.

## 2013-03-07 NOTE — Telephone Encounter (Signed)
New Prob        Pts wife states pt feels BENICAR is causing his severe diarrhea and would like to speak to a nurse regarding an alternative. Please call

## 2013-03-08 ENCOUNTER — Ambulatory Visit (HOSPITAL_COMMUNITY): Payer: Managed Care, Other (non HMO) | Attending: Cardiovascular Disease | Admitting: Radiology

## 2013-03-08 DIAGNOSIS — Z954 Presence of other heart-valve replacement: Secondary | ICD-10-CM | POA: Insufficient documentation

## 2013-03-08 DIAGNOSIS — E669 Obesity, unspecified: Secondary | ICD-10-CM | POA: Insufficient documentation

## 2013-03-08 DIAGNOSIS — R0602 Shortness of breath: Secondary | ICD-10-CM | POA: Insufficient documentation

## 2013-03-08 DIAGNOSIS — I359 Nonrheumatic aortic valve disorder, unspecified: Secondary | ICD-10-CM

## 2013-03-08 DIAGNOSIS — Z951 Presence of aortocoronary bypass graft: Secondary | ICD-10-CM | POA: Insufficient documentation

## 2013-03-08 DIAGNOSIS — I509 Heart failure, unspecified: Secondary | ICD-10-CM | POA: Insufficient documentation

## 2013-03-08 DIAGNOSIS — I2510 Atherosclerotic heart disease of native coronary artery without angina pectoris: Secondary | ICD-10-CM | POA: Insufficient documentation

## 2013-03-08 DIAGNOSIS — E119 Type 2 diabetes mellitus without complications: Secondary | ICD-10-CM | POA: Insufficient documentation

## 2013-03-08 NOTE — Progress Notes (Addendum)
Echocardiogram performed. Echo report faxed

## 2013-03-08 NOTE — Telephone Encounter (Signed)
I spoke with the [name] and made him aware of Dr Earmon Phoenix comments.  At this time the [name] will follow-up with GI. If a cause of diarrhea cannot be determined by GI then the [name] can hold Benicar for one week. [name] verbalized understanding.

## 2013-05-09 DIAGNOSIS — R197 Diarrhea, unspecified: Secondary | ICD-10-CM | POA: Diagnosis not present

## 2013-05-09 DIAGNOSIS — Z1331 Encounter for screening for depression: Secondary | ICD-10-CM | POA: Diagnosis not present

## 2013-05-09 DIAGNOSIS — R0602 Shortness of breath: Secondary | ICD-10-CM | POA: Diagnosis not present

## 2013-05-09 DIAGNOSIS — I4891 Unspecified atrial fibrillation: Secondary | ICD-10-CM | POA: Diagnosis not present

## 2013-05-09 DIAGNOSIS — R609 Edema, unspecified: Secondary | ICD-10-CM | POA: Diagnosis not present

## 2013-05-10 DIAGNOSIS — R197 Diarrhea, unspecified: Secondary | ICD-10-CM | POA: Diagnosis not present

## 2013-05-15 ENCOUNTER — Telehealth: Payer: Self-pay | Admitting: Cardiovascular Disease

## 2013-05-15 NOTE — Telephone Encounter (Signed)
I spoke with the pt and made him aware that we already know he has Atrial Fibrillation.  The pt was previously on Coumadin but this was stopped due to GI bleed.  I asked the pt why the PCP recommended that he see Cardiology and they told him his pulse was fast. Per the pt an EKG was not performed at office visit and he does not remember his pulse. I instructed the pt to monitor his BP and pulse and bring readings to appointment on 05/24/13.  Pt agreed with plan.

## 2013-05-15 NOTE — Telephone Encounter (Signed)
New Problem  [name] states that his PCP advised to have an appointment scheduled with cardiologist because the [name] is in Afib// Made appointment for 10/31 with Lawson Fiscal Gerhardt// Please call if a sooner appointmentt is needed.

## 2013-05-23 DIAGNOSIS — Z23 Encounter for immunization: Secondary | ICD-10-CM | POA: Diagnosis not present

## 2013-05-23 DIAGNOSIS — J3489 Other specified disorders of nose and nasal sinuses: Secondary | ICD-10-CM | POA: Diagnosis not present

## 2013-05-23 DIAGNOSIS — E7800 Pure hypercholesterolemia, unspecified: Secondary | ICD-10-CM | POA: Diagnosis not present

## 2013-05-23 DIAGNOSIS — I10 Essential (primary) hypertension: Secondary | ICD-10-CM | POA: Diagnosis not present

## 2013-05-24 ENCOUNTER — Ambulatory Visit (INDEPENDENT_AMBULATORY_CARE_PROVIDER_SITE_OTHER): Payer: Medicare Other | Admitting: Nurse Practitioner

## 2013-05-24 ENCOUNTER — Ambulatory Visit
Admission: RE | Admit: 2013-05-24 | Discharge: 2013-05-24 | Disposition: A | Discharge status: Home / Self Care | Payer: Medicare Other | Source: Ambulatory Visit | Attending: Nurse Practitioner | Admitting: Nurse Practitioner

## 2013-05-24 ENCOUNTER — Encounter: Payer: Self-pay | Admitting: Nurse Practitioner

## 2013-05-24 VITALS — BP 120/70 | HR 59 | Ht 69.0 in | Wt 283.1 lb

## 2013-05-24 DIAGNOSIS — R0989 Other specified symptoms and signs involving the circulatory and respiratory systems: Secondary | ICD-10-CM | POA: Diagnosis not present

## 2013-05-24 DIAGNOSIS — I4891 Unspecified atrial fibrillation: Secondary | ICD-10-CM

## 2013-05-24 DIAGNOSIS — R0600 Dyspnea, unspecified: Secondary | ICD-10-CM

## 2013-05-24 DIAGNOSIS — R0609 Other forms of dyspnea: Secondary | ICD-10-CM

## 2013-05-24 DIAGNOSIS — J438 Other emphysema: Secondary | ICD-10-CM | POA: Diagnosis not present

## 2013-05-24 LAB — BASIC METABOLIC PANEL
BUN: 20 mg/dL (ref 6–23)
CO2: 23 mEq/L (ref 19–32)
Calcium: 9.9 mg/dL (ref 8.4–10.5)
Chloride: 104 mEq/L (ref 96–112)
Creatinine, Ser: 1 mg/dL (ref 0.4–1.5)
GFR: 79.69 mL/min (ref >60.00)
Glucose, Bld: 169 mg/dL — ABNORMAL HIGH (ref 70–99)
Potassium: 4.4 mEq/L (ref 3.5–5.1)
Sodium: 137 mEq/L (ref 135–145)

## 2013-05-24 LAB — BRAIN NATRIURETIC PEPTIDE: Pro B Natriuretic peptide (BNP): 142 pg/mL — ABNORMAL HIGH (ref 0.0–100.0)

## 2013-05-24 NOTE — Progress Notes (Signed)
[name] Date of Birth: 1945-09-06 Medical Record #[id number]  History of Present Illness: [name] is seen back today for a work in visit. Seen for Dr. Excell Seltzer. He has an extensive cardiac history that includes CAD with past CABG in 2004, permanent atrial fib - not on anticoagulation due to prior GI bleed with Coumadin. Other issues include diastolic heart failure, redo cardiac surgery with AVR for AS, DM, GERD, COPD, iron deficiency anemia, AVMs with past bleeding requiring transfusions, HLD, thrombocytopenia, non alcoholic cirrhosis, ascites, and morbid obesity.  Last cath in 2013 with free radial to OM patent, SVG to DX patent, LIMA to LAD patent and EF of 65 to 70%. He has chronic dyspnea which is felt to be impacted by his poor sleep and obesity.   Last seen here in August. Did not sound like there was much left to offer him.   Comes in today. Apparently at his PCP's - noted his heart rate was fast (but no EKG done) - referred back here. Comes with his wife today. He has a dry cough - not on ACE. Eyes are watery. Short of breath. Had stopped taking his second dose of Lasix - not quite clear to me as to why - but now back on his BID dosing since he saw his PCP last week. Feels better. Says he has lost 10 pounds but weight is up a few since visit here. No real chest pain. Swelling has improved with getting back to his regular dose of Lasix. No bleeding that he is aware of. He wants to know if he can try Nasocort spray. No recent CXR. No fever or chills noted.   Current Outpatient Prescriptions  Medication Sig Dispense Refill  . Ascorbic Acid (VITAMIN C) 1000 MG tablet Take 1,000 mg by mouth 2 (two) times daily.      [name] aspirin EC 81 MG EC tablet Take 1 tablet (81 mg total) by mouth daily.      [name] atorvastatin (LIPITOR) 20 MG tablet Take 20 mg by mouth at bedtime.       [name] DIGOX 250 MCG tablet TAKE 1 TABLET EVERY DAY  90 tablet  1  . ferrous gluconate (FERGON) 325 MG tablet Take 325 mg by  mouth 2 (two) times daily.       . furosemide (LASIX) 80 MG tablet Take 1 tablet (80 mg total) by mouth 2 (two) times daily.  60 tablet  6  . insulin aspart (NOVOLOG) 100 UNIT/ML injection Inject 5-10 Units into the skin 3 (three) times daily with meals.      . insulin regular (NOVOLIN R) 100 units/mL injection Inject 25 Units into the skin 2 (two) times daily before a meal.      . KLOR-CON M10 10 MEQ tablet TAKE 3 TABLETS BY MOUTH TWICE A DAY  180 tablet  6  . metFORMIN (GLUCOPHAGE) 1000 MG tablet Take 1,000 mg by mouth 2 (two) times daily with a meal.       . metoprolol tartrate (LOPRESSOR) 50 MG tablet Take 1 tablet (50 mg total) by mouth 2 (two) times daily.  60 tablet  1  . Multiple Vitamin (MULTIVITAMIN) tablet Take 1 tablet by mouth daily. spectravite      . olmesartan (BENICAR) 20 MG tablet Take 1 tablet (20 mg total) by mouth daily.  90 tablet  2  . omeprazole (PRILOSEC) 20 MG capsule Take 40 mg by mouth 2 (two) times daily.       [name]  Triamcinolone Acetonide (NASACORT ALLERGY 24HR NA) Place 55 mcg into the nose 2 (two) times daily.      . verapamil (CALAN) 80 MG tablet TAKE 1 TABLET BY MOUTH 3 TIMES A DAY  270 tablet  2   No current facility-administered medications for this visit.    Allergies  Allergen Reactions  . Codeine Other (See Comments)    Hurting in chest  . Diltiazem Hcl Itching  . Niacin Other (See Comments)    Hot flashes    Past Medical History  Diagnosis Date  . Overweight(278.02)   . CAD (coronary artery disease)     a. s/p CABG 2004;  b. LHC 10/13:  LHC 8/13: Free radial to obtuse marginal patent, SVG-diagonal patent, LIMA-LAD patent, EF 65-70%, mean aortic valve gradient 42  . Atrial fibrillation     Permanent; off of Coumadin for now due to GI bleed  . DM2 (diabetes mellitus, type 2)   . Insomnia   . Carotid bruit 06/14/2011    a. pre-AVR dopplers 10/13: no sig ICA stenosis  . Hypertension   . Chronic diastolic heart failure   . GERD (gastroesophageal  reflux disease)   . COPD (chronic obstructive pulmonary disease)   . Iron deficiency anemia     Requiring intravenous iron  . H/O hiatal hernia   . AVM (arteriovenous malformation)     Recurrent GI bleeding requiring multiple transfusions  . Hyperlipidemia   . Thrombocytopenia   . Ascites     status post paracentesis with removal of 3.4 L of ascitic fluid  . Osteoarthritis   . Mediastinal adenopathy 09/22/2011  . Cirrhosis   . Iron deficiency anemia   . Mediastinal adenopathy 09/22/2011  . Aortic stenosis 03/08/2012    a.  s/p tissue AVR 10/13 with Dr. Dorris Fetch;   b. Echo 10/13: mod LVH, EF 55-60%, tissue AVR not well seen, no leak, gradient not too high (mean ), MAC, mild MR, mild LAE, PASP 38  . OSA (obstructive sleep apnea) 1999    DOES NOT USE CPAP    Past Surgical History  Procedure Laterality Date  . Coronary artery bypass graft   10/15/2002     [facility]. Dorris Fetch, M.D.     . Carpal tunnel release    10/08/2003  . Lipoma surgery    . Hernia repair    . Other surgical history  08/26/2011    [facility],  enteroscopy , revealing "three-four AVMs."   . Tee without cardioversion  03/07/2012    Procedure: TRANSESOPHAGEAL ECHOCARDIOGRAM (TEE);  Surgeon: Vesta Mixer, MD;  Location: [facility];  Service: Cardiovascular;  Laterality: N/A;  . Coronary angioplasty with stent placement  01/19/2005    drug eluting stent to high grade ostial stenosis of radial artery graft to OM  . Aortic valve replacement  04/25/2012    Procedure: AORTIC VALVE REPLACEMENT (AVR);  Surgeon: Loreli Slot, MD;  Location: [facility] OR;  Service: Open Heart Surgery;  Laterality: N/A;    History  Smoking status  . Former Smoker -- 1.00 packs/day for 30 years  . Types: Cigarettes  . Quit date: 07/25/2000  Smokeless tobacco  . Never Used    History  Alcohol Use  . 0.6 oz/week  . 1 Shots of liquor per week    Comment: Occasionally    Family History  Problem Relation Age of Onset  .  Coronary artery disease      FAMILY HISTORY  . Hypertension Father   . Diabetes Father   .  Heart disease Father   . COPD Sister   . Cancer Maternal Aunt     Breast cancer   . Cancer Maternal Aunt     Breast cancer   . Diabetes Son   . Cancer Daughter     Cervical cancer    Review of Systems: The review of systems is per the HPI.  All other systems were reviewed and are negative.  Physical Exam: BP 120/70  Ht 5\' 9"  (1.753 m)  Wt 283 lb 1.9 oz (128.422 kg)  BMI 41.79 kg/m2 Patient is chronically ill and in no acute distress. Skin is warm and dry. Color is sallowl.  HEENT is unremarkable. Normocephalic/atraumatic. PERRL. Sclera are nonicteric. Neck is supple. No masses. No JVD. Lungs are clear. Cardiac exam shows a regular rate and rhythm. Abdomen is soft. Extremities are without edema. Gait and ROM are intact. No gross neurologic deficits noted.  LABORATORY DATA: BMET and BNP pending  EKG today shows atrial fib with RBBB. Rate is controlled.   Lab Results  Component Value Date   WBC 7.1 02/04/2013   HGB 11.9* 02/04/2013   HCT 36.8* 02/04/2013   PLT 116* 02/04/2013   GLUCOSE 243* 03/01/2013   CHOL 100 03/01/2013   TRIG 157.0* 03/01/2013   HDL 30.70* 03/01/2013   LDLCALC 38 03/01/2013   ALT 34 03/01/2013   AST 31 03/01/2013   NA 137 03/01/2013   K 4.1 03/01/2013   CL 102 03/01/2013   CREATININE 0.9 03/01/2013   BUN 16 03/01/2013   CO2 26 03/01/2013   TSH 2.47 06/27/2012   INR 1.1* 06/27/2012   HGBA1C 8.7* 04/23/2012   Echo Study Conclusions from August 2014  - Left ventricle: The cavity size was mildly dilated. Wall thickness was increased in a pattern of mild LVH. Systolic function was normal. The estimated ejection fraction was in the range of 55% to 60%. - Aortic valve: AV posthesis is difficult to see.Peak and mean gradients through the valve are 40 and 22 mm Hg respectively . This is relatively unchanged from echo of 1 year ago. - Right ventricle: Systolic function was mildly  reduced. - Right atrium: The atrium was mildly dilated. - Pulmonary arteries: PA peak pressure: 32mm Hg (S).    Assessment / Plan: 1. Chronic atrial fib - not on anticoagulation due to prior GI bleeding, anemia, AVM's - his rate is currently controlled. I have left him on his current regimen.   2. CAD - remote CABG - no chest pain reported.   3. NASH - followed by GI in [place].   4. Morbid obesity - crux of his issues.   5. Diastolic HF - back to his usual dose of Lasix - recheck BMET and BNP today.   6. Dyspnea/cough - recheck CXR - ok to use the Nasocort. Has had past effusion.   Tentatively see him back in December as planned. Unfortunately, his course will be quite tenuous.   Patient is agreeable to this plan and will call if any problems develop in the interim.   Rosalio Macadamia, RN, ANP-C [facility] [address] [place], [place]  [address]

## 2013-05-24 NOTE — Patient Instructions (Addendum)
Stay on your current medicines  Ok to use the Nasocort  We need to recheck some lab today  Try to restrict the salt  Please go to [facility] on the first floor for a chest Xray - you may walk in.   See Dr. Excell Seltzer in December as planned.  Call the [facility] office at [phone] if you have any questions, problems or concerns.

## 2013-05-24 NOTE — Addendum Note (Signed)
Addended by: Rosalio Macadamia on: 05/24/2013 01:05 PM   Modules accepted: Orders

## 2013-05-27 ENCOUNTER — Ambulatory Visit: Payer: Medicare Other | Admitting: Hematology and Oncology

## 2013-05-27 ENCOUNTER — Other Ambulatory Visit: Payer: Self-pay | Admitting: Hematology and Oncology

## 2013-05-27 ENCOUNTER — Other Ambulatory Visit: Payer: Self-pay | Admitting: Cardiovascular Disease

## 2013-05-27 ENCOUNTER — Other Ambulatory Visit: Payer: Managed Care, Other (non HMO)

## 2013-05-27 DIAGNOSIS — D649 Anemia, unspecified: Secondary | ICD-10-CM

## 2013-05-28 DIAGNOSIS — R05 Cough: Secondary | ICD-10-CM | POA: Diagnosis not present

## 2013-05-28 DIAGNOSIS — J3489 Other specified disorders of nose and nasal sinuses: Secondary | ICD-10-CM | POA: Diagnosis not present

## 2013-05-28 DIAGNOSIS — R059 Cough, unspecified: Secondary | ICD-10-CM | POA: Diagnosis not present

## 2013-06-02 ENCOUNTER — Other Ambulatory Visit: Payer: Self-pay | Admitting: Cardiovascular Disease

## 2013-06-17 ENCOUNTER — Other Ambulatory Visit: Payer: Self-pay | Admitting: Cardiovascular Disease

## 2013-07-11 ENCOUNTER — Ambulatory Visit (INDEPENDENT_AMBULATORY_CARE_PROVIDER_SITE_OTHER): Payer: Medicare Other | Admitting: Cardiovascular Disease

## 2013-07-11 ENCOUNTER — Encounter: Payer: Self-pay | Admitting: Cardiovascular Disease

## 2013-07-11 VITALS — BP 142/74 | HR 127 | Ht 69.0 in | Wt 277.4 lb

## 2013-07-11 DIAGNOSIS — I4891 Unspecified atrial fibrillation: Secondary | ICD-10-CM | POA: Diagnosis not present

## 2013-07-11 DIAGNOSIS — I509 Heart failure, unspecified: Secondary | ICD-10-CM

## 2013-07-11 DIAGNOSIS — I5032 Chronic diastolic (congestive) heart failure: Secondary | ICD-10-CM

## 2013-07-11 DIAGNOSIS — I359 Nonrheumatic aortic valve disorder, unspecified: Secondary | ICD-10-CM | POA: Diagnosis not present

## 2013-07-11 DIAGNOSIS — I25810 Atherosclerosis of coronary artery bypass graft(s) without angina pectoris: Secondary | ICD-10-CM | POA: Diagnosis not present

## 2013-07-11 DIAGNOSIS — I350 Nonrheumatic aortic (valve) stenosis: Secondary | ICD-10-CM

## 2013-07-11 MED ORDER — METOLAZONE 2.5 MG PO TABS: ORAL_TABLET | ORAL | Status: DC

## 2013-07-11 MED ORDER — POTASSIUM CHLORIDE CRYS ER 10 MEQ PO TBCR: EXTENDED_RELEASE_TABLET | ORAL | Status: DC

## 2013-07-11 NOTE — Patient Instructions (Addendum)
Your physician has recommended you make the following change in your medication: START Zaroxolyn 2.5mg  take 30 minutes prior to your morning dose of Furosemide on Friday and Tuesday, On Friday and Tuesday please take potassium three tablets three times a day  Your physician recommends that you return for lab work in: 2 WEEKS (BMP)  Your physician recommends that you schedule a follow-up appointment in: 2 MONTHS with Dr Excell Seltzer

## 2013-07-11 NOTE — Progress Notes (Signed)
HPI:   67 year old gentleman presenting for followup evaluation. The patient has an extensive cardiac history that includes coronary artery disease status post CABG, permanent atrial fibrillation, diastolic heart failure, and redo cardiac surgery with aortic valve replacement for treatment of severe aortic stenosis. He's also had significant gastrointestinal bleeding from arteriovenous malformations. 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  Lipids from 03/01/2013 showed cholesterol of 100, triglycerides 157, HDL 31, and LDL 38.  The patient is here alone today. He is feeling worse. Complains of marked dyspnea with activity. Wife has been pushing him to exercise but he doesn't feel like he can do it because of breathing. Also complains of orthopnea. No chest pain, lightheadedness, palpitations, or syncope.   Outpatient Encounter Prescriptions as of 07/11/2013  Medication Sig  . Ascorbic Acid (VITAMIN C) 1000 MG tablet Take 1,000 mg by mouth 2 (two) times daily.  [name] aspirin EC 81 MG EC tablet Take 1 tablet (81 mg total) by mouth daily.  [name] atorvastatin (LIPITOR) 20 MG tablet Take 20 mg by mouth at bedtime.   [name] DIGOX 250 MCG tablet TAKE 1 TABLET EVERY DAY  . ferrous gluconate (FERGON) 325 MG tablet Take 325 mg by mouth 2 (two) times daily.   . furosemide (LASIX) 80 MG tablet Take 1 tablet (80 mg total) by mouth 2 (two) times daily.  . insulin aspart (NOVOLOG) 100 UNIT/ML injection Inject 5-10 Units into the skin 3 (three) times daily with meals.  . insulin regular (NOVOLIN R) 100 units/mL injection Inject 25 Units into the skin 2 (two) times daily before a meal.  . KLOR-CON M10 10 MEQ tablet TAKE 3 TABLETS BY MOUTH TWICE A DAY  . metFORMIN (GLUCOPHAGE) 1000 MG tablet Take 1,000 mg by mouth 2 (two) times daily with a meal.   . metoprolol tartrate (LOPRESSOR) 50 MG tablet Take 1 tablet (50 mg total) by mouth 2 (two) times daily.  . Multiple Vitamin  (MULTIVITAMIN) tablet Take 1 tablet by mouth daily. spectravite  . olmesartan (BENICAR) 20 MG tablet Take 1 tablet (20 mg total) by mouth daily.  [name] omeprazole (PRILOSEC) 20 MG capsule Take 40 mg by mouth 2 (two) times daily.   . Triamcinolone Acetonide (NASACORT ALLERGY 24HR NA) Place 55 mcg into the nose 2 (two) times daily.  . verapamil (CALAN) 80 MG tablet TAKE 1 TABLET BY MOUTH 3 TIMES A DAY  . [DISCONTINUED] furosemide (LASIX) 80 MG tablet TAKE 1 TABLET (80 MG TOTAL) BY MOUTH 2 (TWO) TIMES DAILY.    Allergies  Allergen Reactions  . Codeine Other (See Comments)    Hurting in chest  . Diltiazem Hcl Itching  . Niacin Other (See Comments)    Hot flashes    Past Medical History  Diagnosis Date  . Overweight(278.02)   . CAD (coronary artery disease)     a. s/p CABG 2004;  b. LHC 10/13:  LHC 8/13: Free radial to obtuse marginal patent, SVG-diagonal patent, LIMA-LAD patent, EF 65-70%, mean aortic valve gradient 42  . Atrial fibrillation     Permanent; off of Coumadin for now due to GI bleed  . DM2 (diabetes mellitus, type 2)   . Insomnia   . Carotid bruit 06/14/2011    a. pre-AVR dopplers 10/13: no sig ICA stenosis  . Hypertension   . Chronic diastolic heart failure   . GERD (gastroesophageal reflux disease)   . COPD (chronic obstructive pulmonary disease)   . Iron deficiency anemia  Requiring intravenous iron  . H/O hiatal hernia   . AVM (arteriovenous malformation)     Recurrent GI bleeding requiring multiple transfusions  . Hyperlipidemia   . Thrombocytopenia   . Ascites     status post paracentesis with removal of 3.4 L of ascitic fluid  . Osteoarthritis   . Mediastinal adenopathy 09/22/2011  . Cirrhosis   . Iron deficiency anemia   . Mediastinal adenopathy 09/22/2011  . Aortic stenosis 03/08/2012    a.  s/p tissue AVR 10/13 with Dr. Dorris Fetch;   b. Echo 10/13: mod LVH, EF 55-60%, tissue AVR not well seen, no leak, gradient not too high (mean ), MAC, mild MR,  mild LAE, PASP 38  . OSA (obstructive sleep apnea) 1999    DOES NOT USE CPAP    ROS: Negative except as per HPI  BP 142/74  Pulse 127  Ht 5\' 9"  (1.753 m)  Wt 277 lb 6.4 oz (125.828 kg)  BMI 40.95 kg/m2  SpO2 98%  PHYSICAL EXAM: Pt is alert and oriented, pleasant obese male in NAD HEENT: normal Neck: JVP - normal, carotids 2+= with bilateral bruits Lungs: CTA bilaterally CV: irregularly irregular with 2/6 systolic murmur at LSB Abd: soft, NT, Positive BS, no hepatomegaly Ext: 1+ pretibial edema, distal pulses intact and equal Skin: warm/dry no rash  EKG:  Atrial fibrillation 91 beats per minute, right bundle branch block.  2-D echocardiogram 03/08/2013: Study Conclusions  - Left ventricle: The cavity size was mildly dilated. Wall thickness was increased in a pattern of mild LVH. Systolic function was normal. The estimated ejection fraction was in the range of 55% to 60%. - Aortic valve: AV posthesis is difficult to see.Peak and mean gradients through the valve are 40 and 22 mm Hg respectively . This is relatively unchanged from echo of 1 year ago. - Right ventricle: Systolic function was mildly reduced. - Right atrium: The atrium was mildly dilated. - Pulmonary arteries: PA peak pressure: 32mm Hg (S). Impressions:  - No significant change from echo of 2013.  ASSESSMENT AND PLAN: 1. Acute on chronic diastolic heart failure, NYHA Class 3. 2. CAD s/p CABG - patent grafts at cath 2013, no anginal symptoms 3. Obesity 4. HTN 5. Diabetes, Type 2 6. Hyperlipidemia, on atorvastatin  We discussed consideration of aldactone, but he's had painful gyncomastia in the past and can't take this drug. Add metolazone 2.5 mg Tuesday and Fridays. Increase potassium supplementation. Repeat BMET in 2 weeks and I will see back in 2 months. Discussed importance of a low-sodium diet.   Tonny Bollman 07/13/2013 9:40 PM

## 2013-07-15 ENCOUNTER — Other Ambulatory Visit: Payer: Self-pay | Admitting: Cardiovascular Disease

## 2013-07-22 ENCOUNTER — Other Ambulatory Visit: Payer: Self-pay | Admitting: Internal Medicine

## 2013-07-26 ENCOUNTER — Other Ambulatory Visit (INDEPENDENT_AMBULATORY_CARE_PROVIDER_SITE_OTHER): Payer: Medicare Other

## 2013-07-26 DIAGNOSIS — I5032 Chronic diastolic (congestive) heart failure: Secondary | ICD-10-CM | POA: Diagnosis not present

## 2013-07-26 DIAGNOSIS — I509 Heart failure, unspecified: Secondary | ICD-10-CM

## 2013-07-26 DIAGNOSIS — I25810 Atherosclerosis of coronary artery bypass graft(s) without angina pectoris: Secondary | ICD-10-CM | POA: Diagnosis not present

## 2013-07-26 DIAGNOSIS — I359 Nonrheumatic aortic valve disorder, unspecified: Secondary | ICD-10-CM

## 2013-07-26 DIAGNOSIS — I4891 Unspecified atrial fibrillation: Secondary | ICD-10-CM | POA: Diagnosis not present

## 2013-07-26 DIAGNOSIS — I350 Nonrheumatic aortic (valve) stenosis: Secondary | ICD-10-CM

## 2013-07-26 LAB — BASIC METABOLIC PANEL
BUN: 32 mg/dL — ABNORMAL HIGH (ref 6–23)
CALCIUM: 9.2 mg/dL (ref 8.4–10.5)
CO2: 27 mEq/L (ref 19–32)
Chloride: 103 mEq/L (ref 96–112)
Creatinine, Ser: 1.4 mg/dL (ref 0.4–1.5)
GFR: 56.33 mL/min — AB (ref >60.00)
GLUCOSE: 165 mg/dL — AB (ref 70–99)
Potassium: 4 mEq/L (ref 3.5–5.1)
SODIUM: 140 meq/L (ref 135–145)

## 2013-07-30 ENCOUNTER — Emergency Department (HOSPITAL_COMMUNITY): Payer: Medicare Other

## 2013-07-30 ENCOUNTER — Encounter (HOSPITAL_COMMUNITY): Payer: Self-pay | Admitting: Emergency Medicine

## 2013-07-30 ENCOUNTER — Inpatient Hospital Stay (HOSPITAL_COMMUNITY)
Admission: EM | Admit: 2013-07-30 | Discharge: 2013-08-01 | DRG: 377 | Disposition: A | Discharge status: Home / Self Care | Payer: Medicare Other | Attending: Internal Medicine | Admitting: Internal Medicine

## 2013-07-30 DIAGNOSIS — Q2730 Arteriovenous malformation, site unspecified: Secondary | ICD-10-CM

## 2013-07-30 DIAGNOSIS — E119 Type 2 diabetes mellitus without complications: Secondary | ICD-10-CM

## 2013-07-30 DIAGNOSIS — Z7982 Long term (current) use of aspirin: Secondary | ICD-10-CM | POA: Diagnosis not present

## 2013-07-30 DIAGNOSIS — I498 Other specified cardiac arrhythmias: Secondary | ICD-10-CM | POA: Diagnosis present

## 2013-07-30 DIAGNOSIS — I509 Heart failure, unspecified: Secondary | ICD-10-CM

## 2013-07-30 DIAGNOSIS — D696 Thrombocytopenia, unspecified: Secondary | ICD-10-CM

## 2013-07-30 DIAGNOSIS — I2510 Atherosclerotic heart disease of native coronary artery without angina pectoris: Secondary | ICD-10-CM | POA: Diagnosis present

## 2013-07-30 DIAGNOSIS — J449 Chronic obstructive pulmonary disease, unspecified: Secondary | ICD-10-CM | POA: Diagnosis present

## 2013-07-30 DIAGNOSIS — Z6841 Body Mass Index (BMI) 40.0 and over, adult: Secondary | ICD-10-CM

## 2013-07-30 DIAGNOSIS — R188 Other ascites: Secondary | ICD-10-CM

## 2013-07-30 DIAGNOSIS — R05 Cough: Secondary | ICD-10-CM

## 2013-07-30 DIAGNOSIS — R0989 Other specified symptoms and signs involving the circulatory and respiratory systems: Secondary | ICD-10-CM

## 2013-07-30 DIAGNOSIS — Z794 Long term (current) use of insulin: Secondary | ICD-10-CM

## 2013-07-30 DIAGNOSIS — D6959 Other secondary thrombocytopenia: Secondary | ICD-10-CM

## 2013-07-30 DIAGNOSIS — Z9861 Coronary angioplasty status: Secondary | ICD-10-CM

## 2013-07-30 DIAGNOSIS — I5032 Chronic diastolic (congestive) heart failure: Secondary | ICD-10-CM

## 2013-07-30 DIAGNOSIS — N179 Acute kidney failure, unspecified: Secondary | ICD-10-CM | POA: Diagnosis present

## 2013-07-30 DIAGNOSIS — I4821 Permanent atrial fibrillation: Secondary | ICD-10-CM

## 2013-07-30 DIAGNOSIS — K922 Gastrointestinal hemorrhage, unspecified: Secondary | ICD-10-CM

## 2013-07-30 DIAGNOSIS — G4733 Obstructive sleep apnea (adult) (pediatric): Secondary | ICD-10-CM | POA: Diagnosis present

## 2013-07-30 DIAGNOSIS — K7460 Unspecified cirrhosis of liver: Secondary | ICD-10-CM

## 2013-07-30 DIAGNOSIS — I25810 Atherosclerosis of coronary artery bypass graft(s) without angina pectoris: Secondary | ICD-10-CM

## 2013-07-30 DIAGNOSIS — Z951 Presence of aortocoronary bypass graft: Secondary | ICD-10-CM

## 2013-07-30 DIAGNOSIS — R079 Chest pain, unspecified: Secondary | ICD-10-CM

## 2013-07-30 DIAGNOSIS — E663 Overweight: Secondary | ICD-10-CM

## 2013-07-30 DIAGNOSIS — I959 Hypotension, unspecified: Secondary | ICD-10-CM | POA: Diagnosis present

## 2013-07-30 DIAGNOSIS — M1990 Unspecified osteoarthritis, unspecified site: Secondary | ICD-10-CM

## 2013-07-30 DIAGNOSIS — D649 Anemia, unspecified: Secondary | ICD-10-CM

## 2013-07-30 DIAGNOSIS — R739 Hyperglycemia, unspecified: Secondary | ICD-10-CM

## 2013-07-30 DIAGNOSIS — I5033 Acute on chronic diastolic (congestive) heart failure: Secondary | ICD-10-CM | POA: Diagnosis present

## 2013-07-30 DIAGNOSIS — N62 Hypertrophy of breast: Secondary | ICD-10-CM

## 2013-07-30 DIAGNOSIS — Z79899 Other long term (current) drug therapy: Secondary | ICD-10-CM | POA: Diagnosis not present

## 2013-07-30 DIAGNOSIS — I350 Nonrheumatic aortic (valve) stenosis: Secondary | ICD-10-CM

## 2013-07-30 DIAGNOSIS — K2970 Gastritis, unspecified, without bleeding: Secondary | ICD-10-CM | POA: Diagnosis not present

## 2013-07-30 DIAGNOSIS — D62 Acute posthemorrhagic anemia: Secondary | ICD-10-CM | POA: Diagnosis present

## 2013-07-30 DIAGNOSIS — K7689 Other specified diseases of liver: Secondary | ICD-10-CM | POA: Diagnosis present

## 2013-07-30 DIAGNOSIS — K2990 Gastroduodenitis, unspecified, without bleeding: Secondary | ICD-10-CM

## 2013-07-30 DIAGNOSIS — I10 Essential (primary) hypertension: Secondary | ICD-10-CM | POA: Diagnosis present

## 2013-07-30 DIAGNOSIS — Z87891 Personal history of nicotine dependence: Secondary | ICD-10-CM

## 2013-07-30 DIAGNOSIS — K921 Melena: Secondary | ICD-10-CM | POA: Diagnosis present

## 2013-07-30 DIAGNOSIS — E785 Hyperlipidemia, unspecified: Secondary | ICD-10-CM

## 2013-07-30 DIAGNOSIS — K219 Gastro-esophageal reflux disease without esophagitis: Secondary | ICD-10-CM

## 2013-07-30 DIAGNOSIS — R590 Localized enlarged lymph nodes: Secondary | ICD-10-CM

## 2013-07-30 DIAGNOSIS — R059 Cough, unspecified: Secondary | ICD-10-CM

## 2013-07-30 DIAGNOSIS — R0600 Dyspnea, unspecified: Secondary | ICD-10-CM

## 2013-07-30 DIAGNOSIS — J90 Pleural effusion, not elsewhere classified: Secondary | ICD-10-CM

## 2013-07-30 DIAGNOSIS — R5381 Other malaise: Secondary | ICD-10-CM | POA: Diagnosis not present

## 2013-07-30 DIAGNOSIS — K5520 Angiodysplasia of colon without hemorrhage: Secondary | ICD-10-CM

## 2013-07-30 DIAGNOSIS — I4891 Unspecified atrial fibrillation: Secondary | ICD-10-CM

## 2013-07-30 DIAGNOSIS — D509 Iron deficiency anemia, unspecified: Secondary | ICD-10-CM

## 2013-07-30 DIAGNOSIS — Z8719 Personal history of other diseases of the digestive system: Secondary | ICD-10-CM

## 2013-07-30 DIAGNOSIS — Z954 Presence of other heart-valve replacement: Secondary | ICD-10-CM

## 2013-07-30 DIAGNOSIS — J4489 Other specified chronic obstructive pulmonary disease: Secondary | ICD-10-CM | POA: Diagnosis present

## 2013-07-30 DIAGNOSIS — R5383 Other fatigue: Secondary | ICD-10-CM | POA: Diagnosis not present

## 2013-07-30 DIAGNOSIS — R0602 Shortness of breath: Secondary | ICD-10-CM

## 2013-07-30 DIAGNOSIS — D731 Hypersplenism: Secondary | ICD-10-CM

## 2013-07-30 LAB — COMPREHENSIVE METABOLIC PANEL
ALBUMIN: 3.9 g/dL (ref 3.5–5.2)
ALK PHOS: 72 U/L (ref 39–117)
ALT: 13 U/L (ref 0–53)
AST: 18 U/L (ref 0–37)
BUN: 32 mg/dL — ABNORMAL HIGH (ref 6–23)
CO2: 23 mEq/L (ref 19–32)
Calcium: 9.3 mg/dL (ref 8.4–10.5)
Chloride: 100 mEq/L (ref 96–112)
Creatinine, Ser: 1.64 mg/dL — ABNORMAL HIGH (ref 0.50–1.35)
GFR calc Af Amer: 49 mL/min — ABNORMAL LOW (ref >90)
GFR calc non Af Amer: 42 mL/min — ABNORMAL LOW (ref >90)
GLUCOSE: 189 mg/dL — AB (ref 70–99)
POTASSIUM: 4.5 meq/L (ref 3.7–5.3)
SODIUM: 140 meq/L (ref 137–147)
TOTAL PROTEIN: 7.6 g/dL (ref 6.0–8.3)
Total Bilirubin: 0.8 mg/dL (ref 0.3–1.2)

## 2013-07-30 LAB — CBC
HCT: 29.2 % — ABNORMAL LOW (ref 39.0–52.0)
HEMOGLOBIN: 9 g/dL — AB (ref 13.0–17.0)
MCH: 26.2 pg (ref 26.0–34.0)
MCHC: 30.8 g/dL (ref 30.0–36.0)
MCV: 84.9 fL (ref 78.0–100.0)
Platelets: 119 10*3/uL — ABNORMAL LOW (ref 150–400)
RBC: 3.44 MIL/uL — ABNORMAL LOW (ref 4.22–5.81)
RDW: 17.5 % — AB (ref 11.5–15.5)
WBC: 8.7 10*3/uL (ref 4.0–10.5)

## 2013-07-30 LAB — MRSA PCR SCREENING: MRSA BY PCR: NEGATIVE

## 2013-07-30 LAB — RETICULOCYTES
RBC.: 3.35 MIL/uL — ABNORMAL LOW (ref 4.22–5.81)
Retic Count, Absolute: 164.2 10*3/uL (ref 19.0–186.0)
Retic Ct Pct: 4.9 % — ABNORMAL HIGH (ref 0.4–3.1)

## 2013-07-30 LAB — OCCULT BLOOD, POC DEVICE: Fecal Occult Bld: POSITIVE — AB

## 2013-07-30 LAB — TYPE AND SCREEN
ABO/RH(D): A POS
Antibody Screen: POSITIVE
DAT, IGG: NEGATIVE

## 2013-07-30 LAB — TROPONIN I: Troponin I: <0.3 ng/mL (ref <0.30)

## 2013-07-30 LAB — PRO B NATRIURETIC PEPTIDE: PRO B NATRI PEPTIDE: 1166 pg/mL — AB (ref 0–125)

## 2013-07-30 LAB — MAGNESIUM: Magnesium: 1.9 mg/dL (ref 1.5–2.5)

## 2013-07-30 LAB — PROTIME-INR
INR: 1.12 (ref 0.00–1.49)
Prothrombin Time: 14.2 seconds (ref 11.6–15.2)

## 2013-07-30 MED ORDER — POTASSIUM CHLORIDE CRYS ER 20 MEQ PO TBCR
20.0000 meq | EXTENDED_RELEASE_TABLET | Freq: Two times a day (BID) | ORAL | Status: DC
  Administered 2013-07-30: 20 meq via ORAL
  Filled 2013-07-30 (×3): qty 1

## 2013-07-30 MED ORDER — SODIUM CHLORIDE 0.9 % IV SOLN
INTRAVENOUS | Status: DC
  Administered 2013-07-31: via INTRAVENOUS

## 2013-07-30 MED ORDER — SODIUM CHLORIDE 0.9 % IJ SOLN: 3.0000 mL | Freq: Two times a day (BID) | INTRAMUSCULAR | Status: DC

## 2013-07-30 MED ORDER — SODIUM CHLORIDE 0.9 % IV SOLN
80.0000 mg | Freq: Once | INTRAVENOUS | Status: DC
  Filled 2013-07-30: qty 80

## 2013-07-30 MED ORDER — PANTOPRAZOLE SODIUM 40 MG IV SOLR
40.0000 mg | Freq: Once | INTRAVENOUS | Status: AC
  Administered 2013-07-30: 40 mg via INTRAVENOUS
  Filled 2013-07-30: qty 40

## 2013-07-30 MED ORDER — ALBUMIN HUMAN 25 % IV SOLN
12.5000 g | Freq: Once | INTRAVENOUS | Status: DC
  Filled 2013-07-30: qty 50

## 2013-07-30 MED ORDER — FERROUS GLUCONATE 324 (38 FE) MG PO TABS
325.0000 mg | ORAL_TABLET | Freq: Two times a day (BID) | ORAL | Status: DC
  Administered 2013-07-31 – 2013-08-01 (×2): 325 mg via ORAL
  Filled 2013-07-30 (×5): qty 1

## 2013-07-30 MED ORDER — INSULIN ASPART 100 UNIT/ML ~~LOC~~ SOLN
0.0000 [IU] | Freq: Three times a day (TID) | SUBCUTANEOUS | Status: DC
Start: 2013-07-31 — End: 2013-08-01
  Administered 2013-07-31: 3 [IU] via SUBCUTANEOUS
  Administered 2013-08-01: 2 [IU] via SUBCUTANEOUS

## 2013-07-30 MED ORDER — FUROSEMIDE 80 MG PO TABS
80.0000 mg | ORAL_TABLET | Freq: Two times a day (BID) | ORAL | Status: DC
  Administered 2013-07-31 – 2013-08-01 (×2): 80 mg via ORAL
  Filled 2013-07-30 (×3): qty 1

## 2013-07-30 MED ORDER — ONDANSETRON HCL 4 MG/2ML IJ SOLN: 4.0000 mg | Freq: Four times a day (QID) | INTRAMUSCULAR | Status: DC | PRN

## 2013-07-30 MED ORDER — SODIUM CHLORIDE 0.9 % IV SOLN: 250.0000 mL | INTRAVENOUS | Status: DC | PRN

## 2013-07-30 MED ORDER — DIGOXIN 250 MCG PO TABS
0.2500 mg | ORAL_TABLET | Freq: Every day | ORAL | Status: DC
  Filled 2013-07-30: qty 1

## 2013-07-30 MED ORDER — SODIUM CHLORIDE 0.9 % IJ SOLN: 3.0000 mL | INTRAMUSCULAR | Status: DC | PRN

## 2013-07-30 MED ORDER — PANTOPRAZOLE SODIUM 40 MG IV SOLR
8.0000 mg/h | INTRAVENOUS | Status: DC
  Administered 2013-07-30 – 2013-07-31 (×2): 8 mg/h via INTRAVENOUS
  Filled 2013-07-30 (×4): qty 80

## 2013-07-30 MED ORDER — OCTREOTIDE LOAD VIA INFUSION
50.0000 ug | Freq: Once | INTRAVENOUS | Status: AC
  Administered 2013-07-30: 50 ug via INTRAVENOUS
  Filled 2013-07-30: qty 25

## 2013-07-30 MED ORDER — ONDANSETRON HCL 4 MG PO TABS: 4.0000 mg | ORAL_TABLET | Freq: Four times a day (QID) | ORAL | Status: DC | PRN

## 2013-07-30 MED ORDER — LORATADINE 10 MG PO TABS
10.0000 mg | ORAL_TABLET | Freq: Every day | ORAL | Status: DC
  Administered 2013-08-01: 10 mg via ORAL
  Filled 2013-07-30 (×3): qty 1

## 2013-07-30 MED ORDER — INSULIN NPH (HUMAN) (ISOPHANE) 100 UNIT/ML ~~LOC~~ SUSP
15.0000 [IU] | Freq: Two times a day (BID) | SUBCUTANEOUS | Status: DC
  Administered 2013-07-31 – 2013-08-01 (×2): 15 [IU] via SUBCUTANEOUS
  Filled 2013-07-30: qty 10

## 2013-07-30 MED ORDER — SODIUM CHLORIDE 0.9 % IV SOLN
50.0000 ug/h | INTRAVENOUS | Status: DC
  Administered 2013-07-30 – 2013-07-31 (×2): 50 ug/h via INTRAVENOUS
  Filled 2013-07-30 (×4): qty 1

## 2013-07-30 MED ORDER — PANTOPRAZOLE SODIUM 40 MG IV SOLR: 40.0000 mg | Freq: Two times a day (BID) | INTRAVENOUS | Status: DC

## 2013-07-30 MED ORDER — METOPROLOL TARTRATE 25 MG PO TABS
25.0000 mg | ORAL_TABLET | Freq: Two times a day (BID) | ORAL | Status: DC
  Filled 2013-07-30 (×3): qty 1

## 2013-07-30 MED ORDER — LEVALBUTEROL HCL 0.63 MG/3ML IN NEBU
0.6300 mg | INHALATION_SOLUTION | Freq: Four times a day (QID) | RESPIRATORY_TRACT | Status: DC | PRN
  Filled 2013-07-30: qty 3

## 2013-07-30 NOTE — ED Notes (Signed)
Pt sitting on side of bed. Pt is a x 4.

## 2013-07-30 NOTE — Consult Note (Signed)
Referring Provider: No ref. provider found Primary Care Physician:  [name], MD Primary Gastroenterologist:  Dr. Laural Golden  Reason for Consultation:  Anemia and dark stools  HPI: [name] is a 68 y.o. 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  He admits that he has been having black stools for that long as well; moves his bowels a couple of times per day.  Also takes iron supplements 325 mg BID, but says that his stools have still been darker than usual.  Hgb is 9.0 grams and 5 months ago it was 12 grams.  Platelets are only slightly low at 119.  BUN has been elevated at 32 both today and 4 days ago.  Denies nausea, vomiting, abdominal pain (says that it just feels tight).  No LE edema, but abdomen has been increasing in girth.  No bloody stools, fevers, or chills.  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 Benson Norway. He underwent extensive GI evaluation at that time including colonoscopy/EGD/capsule endoscopy.   As far as his cirrhosis concerned, it does not look like he has ever had esophageal or gastric varices.  Has not had fluid removed from his abdomen in a couple of years according to his report, but says that he had 14 Liters removed on one occasion.  He is currently taking lasix 80 mg BID but no spironolactone at home.  Is on potassium supplements with the lasix as well.    His most recent visit with Dr. Laural Golden was in 01/2013 at which time abdominal ultrasound was performed.  He is due to see Dr. Laural Golden next week and usually has a visit with him every 6 months.      Past Medical History  Diagnosis Date   . Overweight(278.02)   . CAD (coronary artery disease)     a. s/p CABG 2004;  b. [place] 10/13:  LHC 8/13: Free radial to obtuse marginal patent, SVG-diagonal patent, LIMA-LAD patent, EF 65-70%, mean aortic valve gradient 42  . Atrial fibrillation     Permanent; off of Coumadin for now due to GI bleed  . DM2 (diabetes mellitus, type 2)   . Insomnia   . Carotid bruit 06/14/2011    a. pre-AVR dopplers 10/13: no sig ICA stenosis  . Hypertension   . Chronic diastolic heart failure   . GERD (gastroesophageal reflux disease)   . COPD (chronic obstructive pulmonary disease)   . Iron deficiency anemia     Requiring intravenous iron  . H/O hiatal hernia   . AVM (arteriovenous malformation)     Recurrent GI bleeding requiring multiple transfusions  . Hyperlipidemia   . Thrombocytopenia   . Ascites     status post paracentesis with removal of 3.4 L of ascitic fluid  . Osteoarthritis   . Mediastinal adenopathy 09/22/2011  . Cirrhosis   . Iron deficiency anemia   . Mediastinal adenopathy 09/22/2011  . Aortic stenosis 03/08/2012    a.  s/p tissue AVR 10/13 with Dr. [name];   b. Echo 10/13: mod LVH, EF 55-60%, tissue AVR not well seen, no leak, gradient not too high (mean 19mmHg),  MAC, mild MR, mild LAE, PASP 38  . OSA (obstructive sleep apnea) 1999    DOES NOT USE CPAP    Past Surgical History  Procedure Laterality Date  . Coronary artery bypass graft   10/15/2002     Steven C. Hendrickson, M.D.     . Carpal tunnel release    10/08/2003  . Lipoma surgery    . Hernia repair    . Other surgical history  08/26/2011    [facility],  enteroscopy , revealing "three-four AVMs."   . Tee without cardioversion  03/07/2012    Procedure: TRANSESOPHAGEAL ECHOCARDIOGRAM (TEE);  Surgeon: Philip J Nahser, MD;  Location: [facility] ENDOSCOPY;  Service: Cardiovascular;  Laterality: N/A;  . Coronary angioplasty with stent placement  01/19/2005    drug eluting stent to high grade ostial stenosis of radial artery graft to  OM  . Aortic valve replacement  04/25/2012    Procedure: AORTIC VALVE REPLACEMENT (AVR);  Surgeon: Steven C Hendrickson, MD;  Location: [facility] OR;  Service: Open Heart Surgery;  Laterality: N/A;    Prior to Admission medications   Medication Sig Start Date End Date Taking? Authorizing Provider  Ascorbic Acid (VITAMIN C) 1000 MG tablet Take 1,000 mg by mouth 2 (two) times daily.   Yes Historical Provider, MD  aspirin EC 81 MG EC tablet Take 1 tablet (81 mg total) by mouth daily. 04/30/12  Yes Donielle M Zimmerman, PA-C  atorvastatin (LIPITOR) 20 MG tablet Take 20 mg by mouth at bedtime.    Yes Historical Provider, MD  digoxin (LANOXIN) 0.25 MG tablet Take 0.25 mg by mouth daily.   Yes Historical Provider, MD  ferrous gluconate (FERGON) 325 MG tablet Take 325 mg by mouth 3 (three) times daily with meals.    Yes Historical Provider, MD  furosemide (LASIX) 80 MG tablet Take 1 tablet (80 mg total) by mouth 2 (two) times daily. 08/15/12  Yes Michael Cooper, MD  insulin aspart (NOVOLOG) 100 UNIT/ML injection Inject 8-14 Units into the skin 3 (three) times daily with meals.    Yes Historical Provider, MD  insulin NPH Human (HUMULIN N,NOVOLIN N) 100 UNIT/ML injection Inject 25 Units into the skin 2 (two) times daily before a meal.   Yes Historical Provider, MD  loratadine (CLARITIN) 10 MG tablet Take 10 mg by mouth daily.   Yes Historical Provider, MD  metFORMIN (GLUCOPHAGE) 1000 MG tablet Take 1,000 mg by mouth 2 (two) times daily with a meal.    Yes Historical Provider, MD  metolazone (ZAROXOLYN) 2.5 MG tablet Take one tablet by mouth 30 minutes prior to your morning dose of Furosemide on Tuesday and Friday 07/11/13  Yes Michael Cooper, MD  metoprolol tartrate (LOPRESSOR) 50 MG tablet Take 1 tablet (50 mg total) by mouth 2 (two) times daily. 05/01/12  Yes Donielle M Zimmerman, PA-C  Multiple Vitamin (MULTIVITAMIN) tablet Take 1 tablet by mouth daily. spectravite   Yes Historical Provider, MD  olmesartan  (BENICAR) 20 MG tablet Take 1 tablet (20 mg total) by mouth daily. 09/24/12  Yes Lori C Gerhardt, NP  omeprazole (PRILOSEC) 20 MG capsule Take 20 mg by mouth 2 (two) times daily.  06/27/12  Yes Michael B Wert, MD  potassium chloride (K-DUR,KLOR-CON) 10 MEQ tablet Take 30 mEq by mouth 2 (two) times daily.   Yes Historical Provider, MD  verapamil (CALAN) 80 MG tablet Take 80 mg by mouth 3 (three) times daily.   Yes Historical Provider, MD    Current Facility-Administered Medications  Medication   Dose Route Frequency Provider Last Rate Last Dose  . pantoprazole (PROTONIX) injection 40 mg  40 mg Intravenous Once John David Wofford, MD       Current Outpatient Prescriptions  Medication Sig Dispense Refill  . Ascorbic Acid (VITAMIN C) 1000 MG tablet Take 1,000 mg by mouth 2 (two) times daily.      . aspirin EC 81 MG EC tablet Take 1 tablet (81 mg total) by mouth daily.      . atorvastatin (LIPITOR) 20 MG tablet Take 20 mg by mouth at bedtime.       . digoxin (LANOXIN) 0.25 MG tablet Take 0.25 mg by mouth daily.      . ferrous gluconate (FERGON) 325 MG tablet Take 325 mg by mouth 3 (three) times daily with meals.       . furosemide (LASIX) 80 MG tablet Take 1 tablet (80 mg total) by mouth 2 (two) times daily.  60 tablet  6  . insulin aspart (NOVOLOG) 100 UNIT/ML injection Inject 8-14 Units into the skin 3 (three) times daily with meals.       . insulin NPH Human (HUMULIN N,NOVOLIN N) 100 UNIT/ML injection Inject 25 Units into the skin 2 (two) times daily before a meal.      . loratadine (CLARITIN) 10 MG tablet Take 10 mg by mouth daily.      . metFORMIN (GLUCOPHAGE) 1000 MG tablet Take 1,000 mg by mouth 2 (two) times daily with a meal.       . metolazone (ZAROXOLYN) 2.5 MG tablet Take one tablet by mouth 30 minutes prior to your morning dose of Furosemide on Tuesday and Friday  30 tablet  3  . metoprolol tartrate (LOPRESSOR) 50 MG tablet Take 1 tablet (50 mg total) by mouth 2 (two) times daily.  60  tablet  1  . Multiple Vitamin (MULTIVITAMIN) tablet Take 1 tablet by mouth daily. spectravite      . olmesartan (BENICAR) 20 MG tablet Take 1 tablet (20 mg total) by mouth daily.  90 tablet  2  . omeprazole (PRILOSEC) 20 MG capsule Take 20 mg by mouth 2 (two) times daily.       . potassium chloride (K-DUR,KLOR-CON) 10 MEQ tablet Take 30 mEq by mouth 2 (two) times daily.      . verapamil (CALAN) 80 MG tablet Take 80 mg by mouth 3 (three) times daily.        Allergies as of 07/30/2013 - Review Complete 07/30/2013  Allergen Reaction Noted  . Codeine Other (See Comments) 12/27/2010  . Diltiazem hcl Itching   . Niacin Other (See Comments)     Family History  Problem Relation Age of Onset  . Coronary artery disease      FAMILY HISTORY  . Hypertension Father   . Diabetes Father   . Heart disease Father   . COPD Sister   . Cancer Maternal Aunt     Breast cancer   . Cancer Maternal Aunt     Breast cancer   . Diabetes Son   . Cancer Daughter     Cervical cancer    History   Social History  . Marital Status: Married    Spouse Name: N/A    Number of Children: 3  . Years of Education: N/A   Occupational History  .      Material Handler at [employer]   Social History Main Topics  . Smoking status: Former Smoker -- 1.00 packs/day for 30 years      Types: Cigarettes    Quit date: 07/25/2000  . Smokeless tobacco: Never Used  . Alcohol Use: 0.6 oz/week    1 Shots of liquor per week     Comment: Occasionally  . Drug Use: No  . Sexual Activity: Yes   Other Topics Concern  . Not on file   Social History Narrative   Lives in [place], [place] with wife.     Review of Systems: Ten point ROS is O/W negative except as mentioned in HPI.  Physical Exam: Vital signs in last 24 hours: Temp:  [97.3 F (36.3 C)] 97.3 F (36.3 C) (01/06 1310) Pulse Rate:  [56-79] 59 (01/06 1530) Resp:  [18] 18 (01/06 1327) BP: (110-128)/(54-63) 124/54 mmHg (01/06 1530) SpO2:  [94 %-100 %] 97 % (01/06  1530) Weight:  [280 lb (127.007 kg)] 280 lb (127.007 kg) (01/06 1310)   General:  Alert, Well-developed, well-nourished, pleasant and cooperative in NAD Head:  Normocephalic and atraumatic. Eyes:  Sclera clear, no icterus.  Conjunctiva pink. Ears:  Normal auditory acuity. Mouth:  No deformity or lesions.   Lungs:  Clear throughout to auscultation.  No wheezes, crackles, or rhonchi.  Somewhat shallow breaths. Heart:  Irregular.  SEM noted. Abdomen:  Soft, but significantly distended from ascites fluid.  BS present.  Non-tender.     Rectal:  Deferred  Msk:  Symmetrical without gross deformities. Pulses:  Normal pulses noted. Extremities:  Without clubbing or edema. Neurologic:  Alert and  oriented x4;  grossly normal neurologically. Skin:  Intact without significant lesions or rashes. Psych:  Alert and cooperative. Normal mood and affect.  Intake/Output this shift: Total I/O In: -  Out: 200 [Urine:200]  Lab Results:  Recent Labs  07/30/13 1309  WBC 8.7  HGB 9.0*  HCT 29.2*  PLT 119*   BMET  Recent Labs  07/30/13 1309  NA 140  K 4.5  CL 100  CO2 23  GLUCOSE 189*  BUN 32*  CREATININE 1.64*  CALCIUM 9.3   LFT  Recent Labs  07/30/13 1309  PROT 7.6  ALBUMIN 3.9  AST 18  ALT 13  ALKPHOS 72  BILITOT 0.8   Studies/Results: Dg Chest Port 1 View  07/30/2013   CLINICAL DATA:  Short of breath  EXAM: PORTABLE CHEST - 1 VIEW  COMPARISON:  DG CHEST 2 VIEW dated 05/24/2013  FINDINGS: Sternotomy wires overlie enlarged silhouette. There is central venous pulmonary congestion. Overall pulmonary edema. Infiltrate or pneumothorax.  IMPRESSION: Cardiomegaly with central venous congestion. No overt pulmonary edema.   Electronically Signed   By: Stewart  Edmunds M.D.   On: 07/30/2013 15:13    IMPRESSION:  #1 Anemia and black stools:  Takes iron supplements 325 mg TID, but stools were heme positive.  Has history of GIB in the past from AVM's.  Hgb is down 3 grams from 5 months  ago.  BUN slightly elevated. #2 History of gastric and duodenal AVM's s/p APC and hemoclip placements in 2012 #3 Cirrhosis secondary to NASH:  C/B ascites and thrombocytopenia.  No evidence of varices on previous endoscopies.   PLAN: -EGD 1/7 for assessment and possible treatment of bleeding source. -Can have clear liquids tonight then NPO after midnight. -Will start PPI and octreotide gtt's empirically until EGD is performed.   -Monitor Hgb and transfuse prn. -Will likely need LVP while inpatient. -May need adjustment of his diuretics with addition of spironolactone to his regimen. -Will need a 2 grams sodium diet and needs to monitor daily weights.     ZEHR, JESSICA D.  07/30/2013, 4:47 PM  Pager number [phone]   ________________________________________________________________________  Velora Heckler GI MD note:  I personally examined the patient, reviewed the data and agree with the assessment and plan described above.  Planning on EGD later this AM.  Will start abx, empirically for GI bleeding in cirrhotic.  NO overt bleeding in about 24 hours (last BM was yesterday).   Owens Loffler, MD [facility] Pager [phone]

## 2013-07-30 NOTE — ED Notes (Signed)
Pt sent from PCP for eval of generalized weakness, dizziness, possible rectal bleeding with diarrhea; pt sts dizzy upon standing; pt sts hgb was 9.0 in PCP office

## 2013-07-30 NOTE — ED Notes (Signed)
Attempted report x 2 

## 2013-07-30 NOTE — H&P (Addendum)
[facility] History and Physical  [name] [id number] DOB: 1945/10/06 DOA: 07/30/2013  Referring physician: ER  PCP: [name], MD   Chief Complaint: Rectal bleeding  HPI:  68 year old male with a history of severe aortic stenosis status post replacement October 2013, DM, atrial fibrillation , on no anticoagulation due to AVM and cirrhosis due to NASH, who presents to the ER with weakness, abdominal distention, progressive dyspnea. Patient had a guaiac positive stool. He feels that his stools have been darker last couple of weeks. Hemoglobin of 9, baseline 11-14. Platelet at baseline of 100 K. Last endoscopy was done in 04/2011 Dr. Benson Norway. He is now followed by Dr. Rogene Houston, MD in [place].  Patient states that he has progressively become more dyspneic. He denies any chest pain. He feels that his abdominal has become more distended despite doubling the dose of his Lasix. He denies any syncopal or near-syncopal episodes. He has an extensive cardiac history that includes coronary artery disease status post CABG, permanent atrial fibrillation, diastolic heart failure, and redo cardiac surgery with aortic valve replacement for treatment of severe aortic stenosis, and is followed by Dr. Sherren Mocha.     Review of Systems: negative for the following  Constitutional: As in history of present illness  HEENT: Denies photophobia, eye pain, redness, hearing loss, ear pain, congestion, sore throat, rhinorrhea, sneezing, mouth sores, trouble swallowing, neck pain, neck stiffness and tinnitus.  Respiratory: Denies SOB, DOE, cough, chest tightness, and wheezing.  Cardiovascular: Denies chest pain, palpitations and leg swelling.  Gastrointestinal: As in history of present illness  Genitourinary: As in history of present illness  Musculoskeletal: Denies myalgias, back pain, joint swelling, arthralgias and gait problem.  Skin: Denies pallor, rash and wound.  Neurological:  Denies dizziness, seizures, syncope, weakness, light-headedness, numbness and headaches.  Hematological: Denies adenopathy. Easy bruising, personal or family bleeding history  Psychiatric/Behavioral: Denies suicidal ideation, mood changes, confusion, nervousness, sleep disturbance and agitation       Past Medical History  Diagnosis Date  . Overweight(278.02)   . CAD (coronary artery disease)     a. s/p CABG 2004;  b. [place] 10/13:  LHC 8/13: Free radial to obtuse marginal patent, SVG-diagonal patent, LIMA-LAD patent, EF 65-70%, mean aortic valve gradient 42  . Atrial fibrillation     Permanent; off of Coumadin for now due to GI bleed  . DM2 (diabetes mellitus, type 2)   . Insomnia   . Carotid bruit 06/14/2011    a. pre-AVR dopplers 10/13: no sig ICA stenosis  . Hypertension   . Chronic diastolic heart failure   . GERD (gastroesophageal reflux disease)   . COPD (chronic obstructive pulmonary disease)   . Iron deficiency anemia     Requiring intravenous iron  . H/O hiatal hernia   . AVM (arteriovenous malformation)     Recurrent GI bleeding requiring multiple transfusions  . Hyperlipidemia   . Thrombocytopenia   . Ascites     status post paracentesis with removal of 3.4 L of ascitic fluid  . Osteoarthritis   . Mediastinal adenopathy 09/22/2011  . Cirrhosis   . Iron deficiency anemia   . Mediastinal adenopathy 09/22/2011  . Aortic stenosis 03/08/2012    a.  s/p tissue AVR 10/13 with Dr. [name];   b. Echo 10/13: mod LVH, EF 55-60%, tissue AVR not well seen, no leak, gradient not too high (mean 44mmHg), MAC, mild MR, mild LAE, PASP 38  . OSA (obstructive sleep apnea) 1999  DOES NOT USE CPAP     Past Surgical History  Procedure Laterality Date  . Coronary artery bypass graft   10/15/2002     [name]. [name], M.D.     . Carpal tunnel release    10/08/2003  . Lipoma surgery    . Hernia repair    . Other surgical history  08/26/2011    [facility],  enteroscopy , revealing  "three-four AVMs."   . Tee without cardioversion  03/07/2012    Procedure: TRANSESOPHAGEAL ECHOCARDIOGRAM (TEE);  Surgeon: Thayer Headings, MD;  Location: [facility] ENDOSCOPY;  Service: Cardiovascular;  Laterality: N/A;  . Coronary angioplasty with stent placement  01/19/2005    drug eluting stent to high grade ostial stenosis of radial artery graft to OM  . Aortic valve replacement  04/25/2012    Procedure: AORTIC VALVE REPLACEMENT (AVR);  Surgeon: Melrose Nakayama, MD;  Location: [place];  Service: Open Heart Surgery;  Laterality: N/A;      Social History:  reports that he quit smoking about 13 years ago. His smoking use included Cigarettes. He has a 30 pack-year smoking history. He has never used smokeless tobacco. He reports that he drinks about 0.6 ounces of alcohol per week. He reports that he does not use illicit drugs.     Allergies  Allergen Reactions  . Codeine Other (See Comments)    Hurting in chest  . Diltiazem Hcl Itching  . Niacin Other (See Comments)    Hot flashes    Family History  Problem Relation Age of Onset  . Coronary artery disease      FAMILY HISTORY  . Hypertension Father   . Diabetes Father   . Heart disease Father   . COPD Sister   . Cancer Maternal Aunt     Breast cancer   . Cancer Maternal Aunt     Breast cancer   . Diabetes Son   . Cancer Daughter     Cervical cancer     Prior to Admission medications   Medication Sig Start Date End Date Taking? Authorizing Provider  Ascorbic Acid (VITAMIN C) 1000 MG tablet Take 1,000 mg by mouth 2 (two) times daily.   Yes Historical Provider, MD  aspirin EC 81 MG EC tablet Take 1 tablet (81 mg total) by mouth daily. 04/30/12  Yes Donielle Liston Alba, PA-C  atorvastatin (LIPITOR) 20 MG tablet Take 20 mg by mouth at bedtime.    Yes Historical Provider, MD  digoxin (LANOXIN) 0.25 MG tablet Take 0.25 mg by mouth daily.   Yes Historical Provider, MD  ferrous gluconate (FERGON) 325 MG tablet Take 325 mg by mouth 3  (three) times daily with meals.    Yes Historical Provider, MD  furosemide (LASIX) 80 MG tablet Take 1 tablet (80 mg total) by mouth 2 (two) times daily. 08/15/12  Yes Sherren Mocha, MD  insulin aspart (NOVOLOG) 100 UNIT/ML injection Inject 8-14 Units into the skin 3 (three) times daily with meals.    Yes Historical Provider, MD  insulin NPH Human (HUMULIN N,NOVOLIN N) 100 UNIT/ML injection Inject 25 Units into the skin 2 (two) times daily before a meal.   Yes Historical Provider, MD  loratadine (CLARITIN) 10 MG tablet Take 10 mg by mouth daily.   Yes Historical Provider, MD  metFORMIN (GLUCOPHAGE) 1000 MG tablet Take 1,000 mg by mouth 2 (two) times daily with a meal.    Yes Historical Provider, MD  metolazone (ZAROXOLYN) 2.5 MG tablet Take one tablet by  mouth 30 minutes prior to your morning dose of Furosemide on Tuesday and Friday 07/11/13  Yes Sherren Mocha, MD  metoprolol tartrate (LOPRESSOR) 50 MG tablet Take 1 tablet (50 mg total) by mouth 2 (two) times daily. 05/01/12  Yes Donielle Liston Alba, PA-C  Multiple Vitamin (MULTIVITAMIN) tablet Take 1 tablet by mouth daily. spectravite   Yes Historical Provider, MD  olmesartan (BENICAR) 20 MG tablet Take 1 tablet (20 mg total) by mouth daily. 09/24/12  Yes Burtis Junes, NP  omeprazole (PRILOSEC) 20 MG capsule Take 20 mg by mouth 2 (two) times daily.  06/27/12  Yes Tanda Rockers, MD  potassium chloride (K-DUR,KLOR-CON) 10 MEQ tablet Take 30 mEq by mouth 2 (two) times daily.   Yes Historical Provider, MD  verapamil (CALAN) 80 MG tablet Take 80 mg by mouth 3 (three) times daily.   Yes Historical Provider, MD     Physical Exam: Filed Vitals:   07/30/13 1327 07/30/13 1500 07/30/13 1530 07/30/13 1630  BP: 128/61 110/59 124/54 128/64  Pulse: 56 74 59 55  Temp:      TempSrc:      Resp: 18     Height:      Weight:      SpO2: 94% 100% 97% 96%     Constitutional: Vital signs reviewed. Patient is a well-developed and well-nourished in no acute  distress and cooperative with exam. Alert and oriented x3.  Head: Normocephalic and atraumatic  Ear: TM normal bilaterally  Mouth: no erythema or exudates, MMM  Eyes: PERRL, EOMI, conjunctivae normal, No scleral icterus.  Neck: Supple, Trachea midline normal ROM, No JVD, mass, thyromegaly, or carotid bruit present.  Cardiovascular: RRR, S1 normal, S2 normal, no MRG, pulses symmetric and intact bilaterally  Pulmonary/Chest: CTAB, no wheezes, rales, or rhonchi  Abdominal: Soft. Non-tender, non-distended, bowel sounds are normal, tense ascites, organomegaly, or guarding present.  GU: no CVA tenderness Musculoskeletal: No joint deformities, erythema, or stiffness, ROM full and no nontender Ext: no edema and no cyanosis, pulses palpable bilaterally (DP and PT)  Hematology: no cervical, inginal, or axillary adenopathy.  Neurological: A&O x3, Strenght is normal and symmetric bilaterally, cranial nerve II-XII are grossly intact, no focal motor deficit, sensory intact to light touch bilaterally.  Skin: Warm, dry and intact. No rash, cyanosis, or clubbing.  Psychiatric: Normal mood and affect. speech and behavior is normal. Judgment and thought content normal. Cognition and memory are normal.       Labs on Admission:    Basic Metabolic Panel:  Recent Labs Lab 07/26/13 0942 07/30/13 1309  NA 140 140  K 4.0 4.5  CL 103 100  CO2 27 23  GLUCOSE 165* 189*  BUN 32* 32*  CREATININE 1.4 1.64*  CALCIUM 9.2 9.3   Liver Function Tests:  Recent Labs Lab 07/30/13 1309  AST 18  ALT 13  ALKPHOS 72  BILITOT 0.8  PROT 7.6  ALBUMIN 3.9   No results found for this basename: LIPASE, AMYLASE,  in the last 168 hours No results found for this basename: AMMONIA,  in the last 168 hours CBC:  Recent Labs Lab 07/30/13 1309  WBC 8.7  HGB 9.0*  HCT 29.2*  MCV 84.9  PLT 119*   Cardiac Enzymes: No results found for this basename: CKTOTAL, CKMB, CKMBINDEX, TROPONINI,  in the last 168  hours  BNP (last 3 results)  Recent Labs  03/06/13 1222 05/24/13 1240 07/30/13 1309  PROBNP 95.0 142.0* 1166.0*      CBG:  No results found for this basename: GLUCAP,  in the last 168 hours  Radiological Exams on Admission: Dg Chest Port 1 View  07/30/2013   CLINICAL DATA:  Short of breath  EXAM: PORTABLE CHEST - 1 VIEW  COMPARISON:  DG CHEST 2 VIEW dated 05/24/2013  FINDINGS: Sternotomy wires overlie enlarged silhouette. There is central venous pulmonary congestion. Overall pulmonary edema. Infiltrate or pneumothorax.  IMPRESSION: Cardiomegaly with central venous congestion. No overt pulmonary edema.   Electronically Signed   By: Suzy Bouchard M.D.   On: 07/30/2013 15:13    EKG: Independently reviewed.   Assessment/Plan Active Problems:   GI bleeding   GI Bleeding Seen by GI in the ER EGD tomorrow morning Clear liquid diet tonight Now on octreotide and PPI drip Continue Lasix, will resume tomorrow after paracentesis   Acute on chronic diastolic heart failure Recent 2-D echo Resume Lasix tomorrow Currently without exacerbation Hold Zaroxolyn, Benicar tonight  Atrial fibrillation with RVR Not an anticoagulation because of history of GI bleeding, AVM On aspirin and will be held tonight because of GI bleeding Continue metoprolol with hold parameters  Severe aortic stenosis, status post bioprosthetic valve, most recent 2-D echo on 03/08/13   Diabetes insulin-dependent Decrease dose to 15 units of Novolin twice a day given n.p.o. past midnight Accu-Cheks every 6 Sliding scale insulin Hold metformin   Ascites/cirrhosis Abdomen severely distended Abdominal ultrasound Paracentesis in the morning Albumin infusion 25% one hour prior to paracentesis Resume Lasix after paracentesis , if blood pressure stable Unfortunately he is intolerant to Spirinolactone because of tender gynecomastia. Therefore please do not add Aldactone    Code Status:   full Family  Communication: bedside Disposition Plan: admit   Time spent: 70 mins   [facility] Pager [phone]  If 7PM-7AM, please contact night-coverage [email or web address] Password [facility] 07/30/2013, 5:56 PM

## 2013-07-30 NOTE — ED Notes (Signed)
Attempted to call report. RN will call back. 

## 2013-07-30 NOTE — ED Notes (Addendum)
Per Dr. Allyson Sabal reports that patient can have clear liquids. Meal tray ordered.

## 2013-07-30 NOTE — ED Provider Notes (Signed)
CSN: [id number]     Arrival date & time 07/30/13  1302 History   First MD Initiated Contact with Patient 07/30/13 1315     Chief Complaint  Patient presents with  . Abnormal Lab  . Weakness   (Consider location/radiation/quality/duration/timing/severity/associated sxs/prior Treatment) HPI Comments: 68 yo male with hx of CHF and recurrent GI bleeding presenting with 1 week of dark stools and worsening dyspnea on exertion and fatigue.  Seen by PCP who noted drop in Hg from 10.6 in October to 9.0 now.    Patient is a 68 y.o. male presenting with weakness and GI illness.  Weakness This is a new problem. The current episode started more than 1 week ago. The problem occurs constantly. The problem has been gradually worsening. Associated symptoms include shortness of breath (Dyspnea on exertion). Pertinent negatives include no chest pain and no abdominal pain. Exacerbated by: activity. The symptoms are relieved by rest. He has tried nothing for the symptoms.  GI Problem This is a recurrent problem. The current episode started more than 1 week ago. The problem occurs constantly. The problem has not changed since onset.Associated symptoms include shortness of breath (Dyspnea on exertion). Pertinent negatives include no chest pain and no abdominal pain. Nothing aggravates the symptoms. Nothing relieves the symptoms.    Past Medical History  Diagnosis Date  . Overweight(278.02)   . CAD (coronary artery disease)     a. s/p CABG 2004;  b. [place] 10/13:  LHC 8/13: Free radial to obtuse marginal patent, SVG-diagonal patent, LIMA-LAD patent, EF 65-70%, mean aortic valve gradient 42  . Atrial fibrillation     Permanent; off of Coumadin for now due to GI bleed  . DM2 (diabetes mellitus, type 2)   . Insomnia   . Carotid bruit 06/14/2011    a. pre-AVR dopplers 10/13: no sig ICA stenosis  . Hypertension   . Chronic diastolic heart failure   . GERD (gastroesophageal reflux disease)   . COPD (chronic  obstructive pulmonary disease)   . Iron deficiency anemia     Requiring intravenous iron  . H/O hiatal hernia   . AVM (arteriovenous malformation)     Recurrent GI bleeding requiring multiple transfusions  . Hyperlipidemia   . Thrombocytopenia   . Ascites     status post paracentesis with removal of 3.4 L of ascitic fluid  . Osteoarthritis   . Mediastinal adenopathy 09/22/2011  . Cirrhosis   . Iron deficiency anemia   . Mediastinal adenopathy 09/22/2011  . Aortic stenosis 03/08/2012    a.  s/p tissue AVR 10/13 with Dr. [name];   b. Echo 10/13: mod LVH, EF 55-60%, tissue AVR not well seen, no leak, gradient not too high (mean 76mmHg), MAC, mild MR, mild LAE, PASP 38  . OSA (obstructive sleep apnea) 1999    DOES NOT USE CPAP   Past Surgical History  Procedure Laterality Date  . Coronary artery bypass graft   10/15/2002     [name]. [name], M.D.     . Carpal tunnel release    10/08/2003  . Lipoma surgery    . Hernia repair    . Other surgical history  08/26/2011    [facility],  enteroscopy , revealing "three-four AVMs."   . Tee without cardioversion  03/07/2012    Procedure: TRANSESOPHAGEAL ECHOCARDIOGRAM (TEE);  Surgeon: Thayer Headings, MD;  Location: [facility] ENDOSCOPY;  Service: Cardiovascular;  Laterality: N/A;  . Coronary angioplasty with stent placement  01/19/2005    drug eluting stent  to high grade ostial stenosis of radial artery graft to OM  . Aortic valve replacement  04/25/2012    Procedure: AORTIC VALVE REPLACEMENT (AVR);  Surgeon: Melrose Nakayama, MD;  Location: [place];  Service: Open Heart Surgery;  Laterality: N/A;   Family History  Problem Relation Age of Onset  . Coronary artery disease      FAMILY HISTORY  . Hypertension Father   . Diabetes Father   . Heart disease Father   . COPD Sister   . Cancer Maternal Aunt     Breast cancer   . Cancer Maternal Aunt     Breast cancer   . Diabetes Son   . Cancer Daughter     Cervical cancer   History  Substance  Use Topics  . Smoking status: Former Smoker -- 1.00 packs/day for 30 years    Types: Cigarettes    Quit date: 07/25/2000  . Smokeless tobacco: Never Used  . Alcohol Use: 0.6 oz/week    1 Shots of liquor per week     Comment: Occasionally    Review of Systems  Constitutional: Negative for fever.  HENT: Negative for congestion.   Respiratory: Positive for shortness of breath (Dyspnea on exertion). Negative for cough.   Cardiovascular: Negative for chest pain.  Gastrointestinal: Positive for diarrhea. Negative for nausea, vomiting and abdominal pain.  Neurological: Positive for weakness.  All other systems reviewed and are negative.    Allergies  Codeine; Diltiazem hcl; and Niacin  Home Medications   Current Outpatient Rx  Name  Route  Sig  Dispense  Refill  . Ascorbic Acid (VITAMIN C) 1000 MG tablet   Oral   Take 1,000 mg by mouth 2 (two) times daily.         [name] aspirin EC 81 MG EC tablet   Oral   Take 1 tablet (81 mg total) by mouth daily.         [name] atorvastatin (LIPITOR) 20 MG tablet   Oral   Take 20 mg by mouth at bedtime.          . digoxin (LANOXIN) 0.25 MG tablet   Oral   Take 0.25 mg by mouth daily.         . ferrous gluconate (FERGON) 325 MG tablet   Oral   Take 325 mg by mouth 3 (three) times daily with meals.          . furosemide (LASIX) 80 MG tablet   Oral   Take 1 tablet (80 mg total) by mouth 2 (two) times daily.   60 tablet   6   . insulin aspart (NOVOLOG) 100 UNIT/ML injection   Subcutaneous   Inject 8-14 Units into the skin 3 (three) times daily with meals.          . insulin NPH Human (HUMULIN N,NOVOLIN N) 100 UNIT/ML injection   Subcutaneous   Inject 25 Units into the skin 2 (two) times daily before a meal.         . loratadine (CLARITIN) 10 MG tablet   Oral   Take 10 mg by mouth daily.         . metFORMIN (GLUCOPHAGE) 1000 MG tablet   Oral   Take 1,000 mg by mouth 2 (two) times daily with a meal.          .  metolazone (ZAROXOLYN) 2.5 MG tablet      Take one tablet by mouth 30 minutes prior to your morning dose  of Furosemide on Tuesday and Friday   30 tablet   3   . metoprolol tartrate (LOPRESSOR) 50 MG tablet   Oral   Take 1 tablet (50 mg total) by mouth 2 (two) times daily.   60 tablet   1   . Multiple Vitamin (MULTIVITAMIN) tablet   Oral   Take 1 tablet by mouth daily. spectravite         . olmesartan (BENICAR) 20 MG tablet   Oral   Take 1 tablet (20 mg total) by mouth daily.   90 tablet   2   . omeprazole (PRILOSEC) 20 MG capsule   Oral   Take 20 mg by mouth 2 (two) times daily.          . potassium chloride (K-DUR,KLOR-CON) 10 MEQ tablet   Oral   Take 30 mEq by mouth 2 (two) times daily.         . verapamil (CALAN) 80 MG tablet   Oral   Take 80 mg by mouth 3 (three) times daily.          BP 128/61  Pulse 56  Temp(Src) 97.3 F (36.3 C) (Oral)  Resp 18  Ht 5\' 8"  (1.727 m)  Wt 280 lb (127.007 kg)  BMI 42.58 kg/m2  SpO2 94% Physical Exam  Nursing note and vitals reviewed. Constitutional: He is oriented to person, place, and time. He appears well-developed and well-nourished. No distress.  HENT:  Head: Normocephalic and atraumatic.  Mouth/Throat: Oropharynx is clear and moist.  Eyes: Conjunctivae are normal. Pupils are equal, round, and reactive to light. No scleral icterus.  Neck: Neck supple.  Cardiovascular: Normal rate, regular rhythm, normal heart sounds and intact distal pulses.   No murmur heard. Pulmonary/Chest: Effort normal and breath sounds normal. No stridor. No respiratory distress. He has no wheezes. He has no rales.  Abdominal: Soft. He exhibits distension. There is no tenderness.  Genitourinary: Rectal exam shows no tenderness. Guaiac positive stool (small amount of green/black stool.  ).  Musculoskeletal: Normal range of motion. He exhibits no edema.  Neurological: He is alert and oriented to person, place, and time.  Skin: Skin is warm  and dry. No rash noted.  Psychiatric: He has a normal mood and affect. His behavior is normal.    ED Course  Procedures (including critical care time) Labs Review Labs Reviewed  CBC - Abnormal; Notable for the following:    RBC 3.44 (*)    Hemoglobin 9.0 (*)    HCT 29.2 (*)    RDW 17.5 (*)    All other components within normal limits  COMPREHENSIVE METABOLIC PANEL - Abnormal; Notable for the following:    Glucose, Bld 189 (*)    BUN 32 (*)    Creatinine, Ser 1.64 (*)    GFR calc non Af Amer 42 (*)    GFR calc Af Amer 49 (*)    All other components within normal limits  OCCULT BLOOD, POC DEVICE - Abnormal; Notable for the following:    Fecal Occult Bld POSITIVE (*)    All other components within normal limits  PRO B NATRIURETIC PEPTIDE  TYPE AND SCREEN   Imaging Review Dg Chest Port 1 View  07/30/2013   CLINICAL DATA:  Short of breath  EXAM: PORTABLE CHEST - 1 VIEW  COMPARISON:  DG CHEST 2 VIEW dated 05/24/2013  FINDINGS: Sternotomy wires overlie enlarged silhouette. There is central venous pulmonary congestion. Overall pulmonary edema. Infiltrate or pneumothorax.  IMPRESSION: Cardiomegaly  with central venous congestion. No overt pulmonary edema.   Electronically Signed   By: Suzy Bouchard M.D.   On: 07/30/2013 15:13  All radiology studies independently viewed by me.     EKG Interpretation    Date/Time:  Tuesday July 30 2013 13:05:54 EST Ventricular Rate:  59 PR Interval:    QRS Duration: 140 QT Interval:  420 QTC Calculation: 415 R Axis:   -107 Text Interpretation:  Atrial fibrillation with slow ventricular response with premature ventricular or aberrantly conducted complexes Right bundle branch block Septal infarct , age undetermined Inferior infarct , age undetermined Abnormal ECG compared to prior, nonspecific ST and T wave changes are improved Confirmed by [facility]  MD, TREY ([id number]) on 07/30/2013 4:01:28 PM            MDM   1. GI bleeding   2. Anemia    I  suspect patient's DOE and fatigue are from a slow upper GI bleed causing symptomatic anemia, with possibly some component of worsening CHF.  I consulted GI for evaluation.  Consulted Hospitalist for admission.  Given IV protonix.      Houston Siren, MD 07/30/13 [phone]

## 2013-07-31 ENCOUNTER — Inpatient Hospital Stay (HOSPITAL_COMMUNITY): Payer: Medicare Other

## 2013-07-31 ENCOUNTER — Encounter (HOSPITAL_COMMUNITY): Payer: Self-pay

## 2013-07-31 ENCOUNTER — Encounter (HOSPITAL_COMMUNITY)
Admission: EM | Disposition: A | Discharge status: Home / Self Care | Payer: Self-pay | Source: Home / Self Care | Attending: Internal Medicine

## 2013-07-31 DIAGNOSIS — K7460 Unspecified cirrhosis of liver: Secondary | ICD-10-CM | POA: Diagnosis not present

## 2013-07-31 DIAGNOSIS — K922 Gastrointestinal hemorrhage, unspecified: Secondary | ICD-10-CM | POA: Diagnosis not present

## 2013-07-31 DIAGNOSIS — K2990 Gastroduodenitis, unspecified, without bleeding: Secondary | ICD-10-CM

## 2013-07-31 DIAGNOSIS — K2970 Gastritis, unspecified, without bleeding: Secondary | ICD-10-CM

## 2013-07-31 DIAGNOSIS — R188 Other ascites: Secondary | ICD-10-CM | POA: Diagnosis not present

## 2013-07-31 HISTORY — PX: ESOPHAGOGASTRODUODENOSCOPY: SHX5428

## 2013-07-31 LAB — CBC
HCT: 27.4 % — ABNORMAL LOW (ref 39.0–52.0)
HCT: 29.3 % — ABNORMAL LOW (ref 39.0–52.0)
Hemoglobin: 8.5 g/dL — ABNORMAL LOW (ref 13.0–17.0)
Hemoglobin: 8.9 g/dL — ABNORMAL LOW (ref 13.0–17.0)
MCH: 26.4 pg (ref 26.0–34.0)
MCH: 25.9 pg — ABNORMAL LOW (ref 26.0–34.0)
MCHC: 31 g/dL (ref 30.0–36.0)
MCHC: 30.4 g/dL (ref 30.0–36.0)
MCV: 85.1 fL (ref 78.0–100.0)
MCV: 85.2 fL (ref 78.0–100.0)
PLATELETS: 103 10*3/uL — AB (ref 150–400)
PLATELETS: 115 10*3/uL — AB (ref 150–400)
RBC: 3.22 MIL/uL — ABNORMAL LOW (ref 4.22–5.81)
RBC: 3.44 MIL/uL — ABNORMAL LOW (ref 4.22–5.81)
RDW: 17.3 % — ABNORMAL HIGH (ref 11.5–15.5)
RDW: 17.4 % — AB (ref 11.5–15.5)
WBC: 6.3 10*3/uL (ref 4.0–10.5)
WBC: 6.7 10*3/uL (ref 4.0–10.5)

## 2013-07-31 LAB — IRON AND TIBC
Iron: 23 ug/dL — ABNORMAL LOW (ref 42–135)
Saturation Ratios: 5 % — ABNORMAL LOW (ref 20–55)
TIBC: 500 ug/dL — ABNORMAL HIGH (ref 215–435)
UIBC: 477 ug/dL — ABNORMAL HIGH (ref 125–400)

## 2013-07-31 LAB — COMPREHENSIVE METABOLIC PANEL
ALBUMIN: 3.8 g/dL (ref 3.5–5.2)
ALT: 12 U/L (ref 0–53)
AST: 18 U/L (ref 0–37)
Alkaline Phosphatase: 73 U/L (ref 39–117)
BILIRUBIN TOTAL: 1 mg/dL (ref 0.3–1.2)
BUN: 31 mg/dL — ABNORMAL HIGH (ref 6–23)
CHLORIDE: 104 meq/L (ref 96–112)
CO2: 25 mEq/L (ref 19–32)
Calcium: 8.8 mg/dL (ref 8.4–10.5)
Creatinine, Ser: 1.63 mg/dL — ABNORMAL HIGH (ref 0.50–1.35)
GFR calc Af Amer: 49 mL/min — ABNORMAL LOW (ref >90)
GFR, EST NON AFRICAN AMERICAN: 43 mL/min — AB (ref >90)
Glucose, Bld: 131 mg/dL — ABNORMAL HIGH (ref 70–99)
Potassium: 4.9 mEq/L (ref 3.7–5.3)
SODIUM: 144 meq/L (ref 137–147)
Total Protein: 7.2 g/dL (ref 6.0–8.3)

## 2013-07-31 LAB — FOLATE: Folate: >20 ng/mL

## 2013-07-31 LAB — GLUCOSE, CAPILLARY
GLUCOSE-CAPILLARY: 137 mg/dL — AB (ref 70–99)
Glucose-Capillary: 108 mg/dL — ABNORMAL HIGH (ref 70–99)
Glucose-Capillary: 136 mg/dL — ABNORMAL HIGH (ref 70–99)
Glucose-Capillary: 149 mg/dL — ABNORMAL HIGH (ref 70–99)
Glucose-Capillary: 155 mg/dL — ABNORMAL HIGH (ref 70–99)
Glucose-Capillary: 185 mg/dL — ABNORMAL HIGH (ref 70–99)

## 2013-07-31 LAB — FERRITIN: FERRITIN: 15 ng/mL — AB (ref 22–322)

## 2013-07-31 LAB — PROTIME-INR
INR: 1.14 (ref 0.00–1.49)
PROTHROMBIN TIME: 14.4 s (ref 11.6–15.2)

## 2013-07-31 LAB — DIGOXIN LEVEL: DIGOXIN LVL: 2.2 ng/mL — AB (ref 0.8–2.0)

## 2013-07-31 LAB — HEMOGLOBIN A1C
HEMOGLOBIN A1C: 6.3 % — AB (ref <5.7)
MEAN PLASMA GLUCOSE: 134 mg/dL — AB (ref <117)

## 2013-07-31 LAB — TROPONIN I

## 2013-07-31 LAB — VITAMIN B12: VITAMIN B 12: 632 pg/mL (ref 211–911)

## 2013-07-31 SURGERY — EGD (ESOPHAGOGASTRODUODENOSCOPY)
Anesthesia: Moderate Sedation

## 2013-07-31 MED ORDER — MIDAZOLAM HCL 10 MG/2ML IJ SOLN
INTRAMUSCULAR | Status: DC | PRN
  Administered 2013-07-31: 1 mg via INTRAVENOUS
  Administered 2013-07-31 (×2): 2 mg via INTRAVENOUS

## 2013-07-31 MED ORDER — FENTANYL CITRATE 0.05 MG/ML IJ SOLN
INTRAMUSCULAR | Status: AC
  Filled 2013-07-31: qty 2

## 2013-07-31 MED ORDER — METOPROLOL TARTRATE 12.5 MG HALF TABLET
12.5000 mg | ORAL_TABLET | Freq: Two times a day (BID) | ORAL | Status: DC
  Filled 2013-07-31: qty 1

## 2013-07-31 MED ORDER — ALBUMIN HUMAN 25 % IV SOLN
12.5000 g | Freq: Once | INTRAVENOUS | Status: DC
  Filled 2013-07-31: qty 50

## 2013-07-31 MED ORDER — BUTAMBEN-TETRACAINE-BENZOCAINE 2-2-14 % EX AERO
INHALATION_SPRAY | CUTANEOUS | Status: DC | PRN
  Administered 2013-07-31: 2 via TOPICAL

## 2013-07-31 MED ORDER — FENTANYL CITRATE 0.05 MG/ML IJ SOLN
INTRAMUSCULAR | Status: DC | PRN
  Administered 2013-07-31 (×2): 25 ug via INTRAVENOUS

## 2013-07-31 MED ORDER — MIDAZOLAM HCL 5 MG/ML IJ SOLN
INTRAMUSCULAR | Status: AC
  Filled 2013-07-31: qty 2

## 2013-07-31 MED ORDER — METOCLOPRAMIDE HCL 5 MG/ML IJ SOLN
10.0000 mg | Freq: Once | INTRAMUSCULAR | Status: AC
  Administered 2013-07-31: 10 mg via INTRAVENOUS
  Filled 2013-07-31: qty 2

## 2013-07-31 MED ORDER — PANTOPRAZOLE SODIUM 40 MG PO TBEC
40.0000 mg | DELAYED_RELEASE_TABLET | Freq: Every day | ORAL | Status: DC
  Administered 2013-07-31 – 2013-08-01 (×2): 40 mg via ORAL
  Filled 2013-07-31: qty 1

## 2013-07-31 MED ORDER — POTASSIUM CHLORIDE CRYS ER 20 MEQ PO TBCR
20.0000 meq | EXTENDED_RELEASE_TABLET | Freq: Every day | ORAL | Status: DC
  Administered 2013-08-01: 20 meq via ORAL
  Filled 2013-07-31: qty 1

## 2013-07-31 MED ORDER — CEFTRIAXONE SODIUM 1 G IJ SOLR
1.0000 g | INTRAMUSCULAR | Status: DC
  Administered 2013-07-31 – 2013-08-01 (×2): 1 g via INTRAVENOUS
  Filled 2013-07-31 (×2): qty 10

## 2013-07-31 MED ORDER — SODIUM CHLORIDE 0.9 % IV SOLN
2.0000 | Freq: Once | INTRAVENOUS | Status: AC
  Administered 2013-07-31: 2 via INTRAVENOUS
  Filled 2013-07-31: qty 80

## 2013-07-31 MED ORDER — DIPHENHYDRAMINE HCL 50 MG/ML IJ SOLN
25.0000 mg | Freq: Once | INTRAMUSCULAR | Status: AC
  Administered 2013-07-31: 25 mg via INTRAVENOUS
  Filled 2013-07-31: qty 1

## 2013-07-31 NOTE — H&P (View-Only) (Signed)
Referring Provider: No ref. provider found Primary Care Physician:  [name], MD Primary Gastroenterologist:  Dr. Laural Golden  Reason for Consultation:  Anemia and dark stools  HPI: [name] is a 68 y.o. 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  He admits that he has been having black stools for that long as well; moves his bowels a couple of times per day.  Also takes iron supplements 325 mg BID, but says that his stools have still been darker than usual.  Hgb is 9.0 grams and 5 months ago it was 12 grams.  Platelets are only slightly low at 119.  BUN has been elevated at 32 both today and 4 days ago.  Denies nausea, vomiting, abdominal pain (says that it just feels tight).  No LE edema, but abdomen has been increasing in girth.  No bloody stools, fevers, or chills.  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 Benson Norway. He underwent extensive GI evaluation at that time including colonoscopy/EGD/capsule endoscopy.   As far as his cirrhosis concerned, it does not look like he has ever had esophageal or gastric varices.  Has not had fluid removed from his abdomen in a couple of years according to his report, but says that he had 14 Liters removed on one occasion.  He is currently taking lasix 80 mg BID but no spironolactone at home.  Is on potassium supplements with the lasix as well.    His most recent visit with Dr. Laural Golden was in 01/2013 at which time abdominal ultrasound was performed.  He is due to see Dr. Laural Golden next week and usually has a visit with him every 6 months.      Past Medical History  Diagnosis Date   . Overweight(278.02)   . CAD (coronary artery disease)     a. s/p CABG 2004;  b. [place] 10/13:  LHC 8/13: Free radial to obtuse marginal patent, SVG-diagonal patent, LIMA-LAD patent, EF 65-70%, mean aortic valve gradient 42  . Atrial fibrillation     Permanent; off of Coumadin for now due to GI bleed  . DM2 (diabetes mellitus, type 2)   . Insomnia   . Carotid bruit 06/14/2011    a. pre-AVR dopplers 10/13: no sig ICA stenosis  . Hypertension   . Chronic diastolic heart failure   . GERD (gastroesophageal reflux disease)   . COPD (chronic obstructive pulmonary disease)   . Iron deficiency anemia     Requiring intravenous iron  . H/O hiatal hernia   . AVM (arteriovenous malformation)     Recurrent GI bleeding requiring multiple transfusions  . Hyperlipidemia   . Thrombocytopenia   . Ascites     status post paracentesis with removal of 3.4 L of ascitic fluid  . Osteoarthritis   . Mediastinal adenopathy 09/22/2011  . Cirrhosis   . Iron deficiency anemia   . Mediastinal adenopathy 09/22/2011  . Aortic stenosis 03/08/2012    a.  s/p tissue AVR 10/13 with Dr. [name];   b. Echo 10/13: mod LVH, EF 55-60%, tissue AVR not well seen, no leak, gradient not too high (mean 19mmHg),  MAC, mild MR, mild LAE, PASP 38  . OSA (obstructive sleep apnea) 1999    DOES NOT USE CPAP    Past Surgical History  Procedure Laterality Date  . Coronary artery bypass graft   10/15/2002     [name]. [name], M.D.     . Carpal tunnel release    10/08/2003  . Lipoma surgery    . Hernia repair    . Other surgical history  08/26/2011    [facility],  enteroscopy , revealing "three-four AVMs."   . Tee without cardioversion  03/07/2012    Procedure: TRANSESOPHAGEAL ECHOCARDIOGRAM (TEE);  Surgeon: Thayer Headings, MD;  Location: [facility] ENDOSCOPY;  Service: Cardiovascular;  Laterality: N/A;  . Coronary angioplasty with stent placement  01/19/2005    drug eluting stent to high grade ostial stenosis of radial artery graft to  OM  . Aortic valve replacement  04/25/2012    Procedure: AORTIC VALVE REPLACEMENT (AVR);  Surgeon: Melrose Nakayama, MD;  Location: [place];  Service: Open Heart Surgery;  Laterality: N/A;    Prior to Admission medications   Medication Sig Start Date End Date Taking? Authorizing Provider  Ascorbic Acid (VITAMIN C) 1000 MG tablet Take 1,000 mg by mouth 2 (two) times daily.   Yes Historical Provider, MD  aspirin EC 81 MG EC tablet Take 1 tablet (81 mg total) by mouth daily. 04/30/12  Yes Donielle Liston Alba, PA-C  atorvastatin (LIPITOR) 20 MG tablet Take 20 mg by mouth at bedtime.    Yes Historical Provider, MD  digoxin (LANOXIN) 0.25 MG tablet Take 0.25 mg by mouth daily.   Yes Historical Provider, MD  ferrous gluconate (FERGON) 325 MG tablet Take 325 mg by mouth 3 (three) times daily with meals.    Yes Historical Provider, MD  furosemide (LASIX) 80 MG tablet Take 1 tablet (80 mg total) by mouth 2 (two) times daily. 08/15/12  Yes Sherren Mocha, MD  insulin aspart (NOVOLOG) 100 UNIT/ML injection Inject 8-14 Units into the skin 3 (three) times daily with meals.    Yes Historical Provider, MD  insulin NPH Human (HUMULIN N,NOVOLIN N) 100 UNIT/ML injection Inject 25 Units into the skin 2 (two) times daily before a meal.   Yes Historical Provider, MD  loratadine (CLARITIN) 10 MG tablet Take 10 mg by mouth daily.   Yes Historical Provider, MD  metFORMIN (GLUCOPHAGE) 1000 MG tablet Take 1,000 mg by mouth 2 (two) times daily with a meal.    Yes Historical Provider, MD  metolazone (ZAROXOLYN) 2.5 MG tablet Take one tablet by mouth 30 minutes prior to your morning dose of Furosemide on Tuesday and Friday 07/11/13  Yes Sherren Mocha, MD  metoprolol tartrate (LOPRESSOR) 50 MG tablet Take 1 tablet (50 mg total) by mouth 2 (two) times daily. 05/01/12  Yes Donielle Liston Alba, PA-C  Multiple Vitamin (MULTIVITAMIN) tablet Take 1 tablet by mouth daily. spectravite   Yes Historical Provider, MD  olmesartan  (BENICAR) 20 MG tablet Take 1 tablet (20 mg total) by mouth daily. 09/24/12  Yes Burtis Junes, NP  omeprazole (PRILOSEC) 20 MG capsule Take 20 mg by mouth 2 (two) times daily.  06/27/12  Yes Tanda Rockers, MD  potassium chloride (K-DUR,KLOR-CON) 10 MEQ tablet Take 30 mEq by mouth 2 (two) times daily.   Yes Historical Provider, MD  verapamil (CALAN) 80 MG tablet Take 80 mg by mouth 3 (three) times daily.   Yes Historical Provider, MD    Current Facility-Administered Medications  Medication  Dose Route Frequency Provider Last Rate Last Dose  . pantoprazole (PROTONIX) injection 40 mg  40 mg Intravenous Once Matthew Siren, MD       Current Outpatient Prescriptions  Medication Sig Dispense Refill  . Ascorbic Acid (VITAMIN C) 1000 MG tablet Take 1,000 mg by mouth 2 (two) times daily.      [name] aspirin EC 81 MG EC tablet Take 1 tablet (81 mg total) by mouth daily.      [name] atorvastatin (LIPITOR) 20 MG tablet Take 20 mg by mouth at bedtime.       . digoxin (LANOXIN) 0.25 MG tablet Take 0.25 mg by mouth daily.      . ferrous gluconate (FERGON) 325 MG tablet Take 325 mg by mouth 3 (three) times daily with meals.       . furosemide (LASIX) 80 MG tablet Take 1 tablet (80 mg total) by mouth 2 (two) times daily.  60 tablet  6  . insulin aspart (NOVOLOG) 100 UNIT/ML injection Inject 8-14 Units into the skin 3 (three) times daily with meals.       . insulin NPH Human (HUMULIN N,NOVOLIN N) 100 UNIT/ML injection Inject 25 Units into the skin 2 (two) times daily before a meal.      . loratadine (CLARITIN) 10 MG tablet Take 10 mg by mouth daily.      . metFORMIN (GLUCOPHAGE) 1000 MG tablet Take 1,000 mg by mouth 2 (two) times daily with a meal.       . metolazone (ZAROXOLYN) 2.5 MG tablet Take one tablet by mouth 30 minutes prior to your morning dose of Furosemide on Tuesday and Friday  30 tablet  3  . metoprolol tartrate (LOPRESSOR) 50 MG tablet Take 1 tablet (50 mg total) by mouth 2 (two) times daily.  60  tablet  1  . Multiple Vitamin (MULTIVITAMIN) tablet Take 1 tablet by mouth daily. spectravite      . olmesartan (BENICAR) 20 MG tablet Take 1 tablet (20 mg total) by mouth daily.  90 tablet  2  . omeprazole (PRILOSEC) 20 MG capsule Take 20 mg by mouth 2 (two) times daily.       . potassium chloride (K-DUR,KLOR-CON) 10 MEQ tablet Take 30 mEq by mouth 2 (two) times daily.      . verapamil (CALAN) 80 MG tablet Take 80 mg by mouth 3 (three) times daily.        Allergies as of 07/30/2013 - Review Complete 07/30/2013  Allergen Reaction Noted  . Codeine Other (See Comments) 12/27/2010  . Diltiazem hcl Itching   . Niacin Other (See Comments)     Family History  Problem Relation Age of Onset  . Coronary artery disease      FAMILY HISTORY  . Hypertension Father   . Diabetes Father   . Heart disease Father   . COPD Sister   . Cancer Maternal Aunt     Breast cancer   . Cancer Maternal Aunt     Breast cancer   . Diabetes Son   . Cancer Daughter     Cervical cancer    History   Social History  . Marital Status: Married    Spouse Name: N/A    Number of Children: 3  . Years of Education: N/A   Occupational History  .      [employer] at [employer] History Main Topics  . Smoking status: Former Smoker -- 1.00 packs/day for 30 years  Types: Cigarettes    Quit date: 07/25/2000  . Smokeless tobacco: Never Used  . Alcohol Use: 0.6 oz/week    1 Shots of liquor per week     Comment: Occasionally  . Drug Use: No  . Sexual Activity: Yes   Other Topics Concern  . Not on file   Social History Narrative   Lives in [place], [place] with wife.     Review of Systems: Ten point ROS is O/W negative except as mentioned in HPI.  Physical Exam: Vital signs in last 24 hours: Temp:  [97.3 F (36.3 C)] 97.3 F (36.3 C) (01/06 1310) Pulse Rate:  [56-79] 59 (01/06 1530) Resp:  [18] 18 (01/06 1327) BP: (110-128)/(54-63) 124/54 mmHg (01/06 1530) SpO2:  [94 %-100 %] 97 % (01/06  1530) Weight:  [280 lb (127.007 kg)] 280 lb (127.007 kg) (01/06 1310)   General:  Alert, Well-developed, well-nourished, pleasant and cooperative in NAD Head:  Normocephalic and atraumatic. Eyes:  Sclera clear, no icterus.  Conjunctiva pink. Ears:  Normal auditory acuity. Mouth:  No deformity or lesions.   Lungs:  Clear throughout to auscultation.  No wheezes, crackles, or rhonchi.  Somewhat shallow breaths. Heart:  Irregular.  SEM noted. Abdomen:  Soft, but significantly distended from ascites fluid.  BS present.  Non-tender.     Rectal:  Deferred  Msk:  Symmetrical without gross deformities. Pulses:  Normal pulses noted. Extremities:  Without clubbing or edema. Neurologic:  Alert and  oriented x4;  grossly normal neurologically. Skin:  Intact without significant lesions or rashes. Psych:  Alert and cooperative. Normal mood and affect.  Intake/Output this shift: Total I/O In: -  Out: 200 [Urine:200]  Lab Results:  Recent Labs  07/30/13 1309  WBC 8.7  HGB 9.0*  HCT 29.2*  PLT 119*   BMET  Recent Labs  07/30/13 1309  NA 140  K 4.5  CL 100  CO2 23  GLUCOSE 189*  BUN 32*  CREATININE 1.64*  CALCIUM 9.3   LFT  Recent Labs  07/30/13 1309  PROT 7.6  ALBUMIN 3.9  AST 18  ALT 13  ALKPHOS 72  BILITOT 0.8   Studies/Results: Dg Chest Port 1 View  07/30/2013   CLINICAL DATA:  Short of breath  EXAM: PORTABLE CHEST - 1 VIEW  COMPARISON:  DG CHEST 2 VIEW dated 05/24/2013  FINDINGS: Sternotomy wires overlie enlarged silhouette. There is central venous pulmonary congestion. Overall pulmonary edema. Infiltrate or pneumothorax.  IMPRESSION: Cardiomegaly with central venous congestion. No overt pulmonary edema.   Electronically Signed   By: Suzy Bouchard M.D.   On: 07/30/2013 15:13    IMPRESSION:  #1 Anemia and black stools:  Takes iron supplements 325 mg TID, but stools were heme positive.  Has history of GIB in the past from AVM's.  Hgb is down 3 grams from 5 months  ago.  BUN slightly elevated. #2 History of gastric and duodenal AVM's s/p APC and hemoclip placements in 2012 #3 Cirrhosis secondary to NASH:  C/B ascites and thrombocytopenia.  No evidence of varices on previous endoscopies.   PLAN: -EGD 1/7 for assessment and possible treatment of bleeding source. -Can have clear liquids tonight then NPO after midnight. -Will start PPI and octreotide gtt's empirically until EGD is performed.   -Monitor Hgb and transfuse prn. -Will likely need LVP while inpatient. -May need adjustment of his diuretics with addition of spironolactone to his regimen. -Will need a 2 grams sodium diet and needs to monitor daily weights.  [name], Matthew D.  07/30/2013, 4:47 PM  Pager number [phone]   ________________________________________________________________________  Velora Heckler GI MD note:  I personally examined the patient, reviewed the data and agree with the assessment and plan described above.  Planning on EGD later this AM.  Will start abx, empirically for GI bleeding in cirrhotic.  NO overt bleeding in about 24 hours (last BM was yesterday).   Matthew Loffler, MD [facility] Gastroenterology Pager [phone]

## 2013-07-31 NOTE — Interval H&P Note (Signed)
History and Physical Interval Note:  07/31/2013 10:18 AM  [name]  has presented today for surgery, with the diagnosis of Anemia and black stools; history of AVMs and cirrhosis  The various methods of treatment have been discussed with the patient and family. After consideration of risks, benefits and other options for treatment, the patient has consented to  Procedure(s): ESOPHAGOGASTRODUODENOSCOPY (EGD) (N/A) as a surgical intervention .  The patient's history has been reviewed, patient examined, no change in status, stable for surgery.  I have reviewed the patient's chart and labs.  Questions were answered to the patient's satisfaction.     [name]

## 2013-07-31 NOTE — Op Note (Signed)
[facility] [place], [id number]   ENDOSCOPY PROCEDURE REPORT  PATIENT: [name], [name]  MR#: [id number] BIRTHDATE: 09-Jun-1946 , 43  yrs. old GENDER: Male ENDOSCOPIST: Milus Banister, MD REFERRED BY: PROCEDURE DATE:  07/31/2013 PROCEDURE:  EGD w/ biopsy ASA CLASS:     Class III INDICATIONS:  known cirrhosis, recent melena.  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 Benson Norway. He underwent extensive GI evaluation at that time including colonoscopy/EGD/capsule endoscopy. Currently sees Dr. Laural Golden, has office appt next week with him. MEDICATIONS: Fentanyl 50 mcg IV and Versed 5 mg IV TOPICAL ANESTHETIC: Cetacaine Spray  DESCRIPTION OF PROCEDURE: After the risks benefits and alternatives of the procedure were thoroughly explained, informed consent was obtained.  The Pentax Gastroscope [id number] endoscope was introduced through the mouth and advanced to the second portion of the duodenum. Without limitations.  The instrument was slowly withdrawn as the mucosa was fully examined.  There was no blood in UGI tract.  There was mild to moderate distal gastritis with at least two punctate ulcers in antrum.  Biopsies were taken to check for H.  pylori.  No AVMs were seen in UGI tract.  There was mild portal gastropathy changes, especially in proximal stomch.  No gastric or esophageal varices.  Retroflexed views revealed no abnormalities.     The scope was then withdrawn from the patient and the procedure completed. COMPLICATIONS: There were no complications.  ENDOSCOPIC IMPRESSION: There was no blood in UGI tract.  There was mild to moderate distal gastritis with at least two punctate ulcers in antrum.  Biopsies were taken to check for H.  pylori.  No AVMs were seen in UGI tract.  There was mild portal gastropathy changes, especially in proximal stomch.  No gastric or  esophageal varices.  RECOMMENDATIONS: Can stop octreotide gtts, can change to oral PPI. If biopsies show H.  pylori, he will be put on appropriate antibiotics. For now, no plans for further endoscopic workup given 2012 colonoscoyp and SBCapsule testing. Watch for overt bleeding.  If none over next 24-36 hours then OK to discharge.   eSigned:  Milus Banister, MD 07/31/2013 10:51 AM   CC: Hildred Laser, MD

## 2013-07-31 NOTE — Progress Notes (Signed)
Pharmacy to dose DigiFab  Assessment Pt with EKG changes, irregular heart beats, HR 43-115.  BP okay. Digoxin level = 2.2, K=4.9.  Based on patients weight and serum digoxin level, will give patient 2 vials of digifab  Plan DigiFab 2 vials x1 dose Monitor HR, BP, K+, Dig level   Hughes Better, PharmD, BCPS Clinical Pharmacist 07/31/2013 8:45 PM

## 2013-07-31 NOTE — Procedures (Signed)
Successful US guided paracentesis from RUQ.  Yielded 1.5 liters of amber colored fluid.  No immediate complications.  Pt tolerated well.   Specimen was not sent for labs.  Tsosie Billing D PA-C 07/31/2013 3:31 PM

## 2013-07-31 NOTE — Progress Notes (Signed)
Notified by Radiology to hold ordered dose of albumin prior to ABD paracentesis. Medication held. Will continue to monitor.

## 2013-07-31 NOTE — Progress Notes (Addendum)
[facility] Progress Note [place] TEAM 1 - Stepdown/ICU TEAM   [name] RCV:[id number] DOB: [birth year] DOA: 07/30/2013 PCP: [name], MD  Admit HPI / Brief Narrative: 68 year old male with a history of severe aortic stenosis status post replacement October 2013, DM, atrial fibrillation, on no anticoagulation due to AVM and cirrhosis due to NASH, who presented to the ER with weakness, abdominal distention, and progressive dyspnea. Patient had a guaiac positive stool. He felt that his stools had been darker for a couple of weeks. Hemoglobin of 9 at presentation - baseline 11-14. Platelet at baseline of 100 K.   Last endoscopy was done in 04/2011 per Dr. Benson Norway. He is now followed by Dr. Rogene Houston, MD in [place].   Patient stated that he had progressively become more dyspneic. He denied chest pain. He felt that his abdomin had become more distended despite doubling the dose of his Lasix. He denied any syncopal or near-syncopal episodes.   He has an extensive cardiac history that includes coronary artery disease status post CABG, permanent atrial fibrillation, diastolic heart failure, and aortic valve replacement for treatment of severe aortic stenosis.  He is followed by Dr. Sherren Mocha.  HPI/Subjective: Pt is seen post endoscopy. He denies current complaints to include no cp, n/v, sob, or abdom pain.    Assessment/Plan:  GIB No evidence of active bleeding on EGD - follow Hgb - recs per GI noted   Acute blood loss anemia Threshold for transfusion will be Hgb <8.0 - recheck this evening and again in AM  Bradycardia  Check dig level - stop BB for now - watch lytes   Cirrhosis due to NASH w/ ascites he is intolerant of Spirinolactone because of tender gynecomastia - for paracentesis today - will need to monitor in SDU after as pt will be high risk for hypotension   Acute on chronic diastolic heart failure Diurese as able   Relative hypotension  D/C BB and  follow - to receive albumin with/after paracentesis  Permanent Afib Not an anticoagulation because of history of GI bleeding, AVM - no tachy at present   Severe AoS s/p AVR s/p tissue AVR 10/13  DM CBG reasonably controlled  CAD s/p CABG 2004 patent grafts at cath 2013  COPD Well compensated at this time   Obesity - Body mass index is 42.58 kg/(m^2).  Code Status: FULL Family Communication: no family present at time of exam Disposition Plan: SDU  Consultants: GI  Procedures: 1/7 - EGD - no blood in UGI tract - mild to moderate distal gastritis with at least two punctate ulcers in antrum - no AVMs were seen - No gastric or esophageal varices  Antibiotics: Rocephin 1/07 >>  DVT prophylaxis: SCDs  Objective: Blood pressure 131/82, pulse 43, temperature 97.8 F (36.6 C), temperature source Oral, resp. rate 17, height 5\' 8"  (1.727 m), weight 127.007 kg (280 lb), SpO2 96.00%.  Intake/Output Summary (Last 24 hours) at 07/31/13 1706 Last data filed at 07/31/13 0700  Gross per 24 hour  Intake    840 ml  Output    450 ml  Net    390 ml   Exam: General: No acute respiratory distress at rest  Lungs: Clear to auscultation bilaterally without wheezes or crackles Cardiovascular: Regular rate and rhythm without murmur gallop or rub normal S1 and S2 Abdomen: protuberant, mildly tender to deep palpation diffuesly, soft, bowel sounds positive, no rebound, no ascites, no appreciable mass Extremities: No significant cyanosis, or clubbing;  1+ edema bilateral lower extremities  Data Reviewed: Basic Metabolic Panel:  Recent Labs Lab 07/26/13 0942 07/30/13 1309 07/30/13 2030 07/31/13 0218  NA 140 140  --  144  K 4.0 4.5  --  4.9  CL 103 100  --  104  CO2 27 23  --  25  GLUCOSE 165* 189*  --  131*  BUN 32* 32*  --  31*  CREATININE 1.4 1.64*  --  1.63*  CALCIUM 9.2 9.3  --  8.8  MG  --   --  1.9  --    Liver Function Tests:  Recent Labs Lab 07/30/13 1309  07/31/13 0218  AST 18 18  ALT 13 12  ALKPHOS 72 73  BILITOT 0.8 1.0  PROT 7.6 7.2  ALBUMIN 3.9 3.8   CBC:  Recent Labs Lab 07/30/13 1309 07/31/13 0218  WBC 8.7 6.3  HGB 9.0* 8.5*  HCT 29.2* 27.4*  MCV 84.9 85.1  PLT 119* 103*   Cardiac Enzymes:  Recent Labs Lab 07/30/13 2030 07/31/13 0218  TROPONINI <0.30 <0.30   BNP (last 3 results)  Recent Labs  03/06/13 1222 05/24/13 1240 07/30/13 1309  PROBNP 95.0 142.0* 1166.0*   CBG:  Recent Labs Lab 07/30/13 2332 07/31/13 0323 07/31/13 0813 07/31/13 1136 07/31/13 1633  GLUCAP 136* 137* 149* 155* 185*    Recent Results (from the past 240 hour(s))  MRSA PCR SCREENING     Status: None   Collection Time    07/30/13  8:12 PM      Result Value Range Status   MRSA by PCR NEGATIVE  NEGATIVE Final   Comment:            The GeneXpert MRSA Assay (FDA     approved for NASAL specimens     only), is one component of a     comprehensive MRSA colonization     surveillance program. It is not     intended to diagnose MRSA     infection nor to guide or     monitor treatment for     MRSA infections.     Studies:  Recent x-ray studies have been reviewed in detail by the Attending Physician  Scheduled Meds:  Scheduled Meds: . albumin human  12.5 g Intravenous Once  . cefTRIAXone (ROCEPHIN)  IV  1 g Intravenous Q24H  . ferrous gluconate  325 mg Oral BID WC  . furosemide  80 mg Oral BID  . insulin aspart  0-15 Units Subcutaneous TID WC  . insulin NPH Human  15 Units Subcutaneous BID AC  . loratadine  10 mg Oral Daily  . pantoprazole  40 mg Oral Q0600  . potassium chloride  20 mEq Oral BID  . sodium chloride  3 mL Intravenous Q12H    Time spent on care of this patient: 35 mins   [place]  [phone] Pager - Text Page per Shea Evans as per below:  On-Call/Text Page:      [email or web address]      password [email or web address]  If 7PM-7AM, please contact night-coverage [email or web address] Password  [email or web address] 07/31/2013, 5:06 PM   LOS: 1 day

## 2013-07-31 NOTE — Progress Notes (Signed)
Pt having EKG changes with irregular beats including pauses. MD notified. No new orders received. Will continue to monitor.

## 2013-08-01 ENCOUNTER — Encounter (HOSPITAL_COMMUNITY): Payer: Self-pay | Admitting: Gastroenterology

## 2013-08-01 DIAGNOSIS — K922 Gastrointestinal hemorrhage, unspecified: Secondary | ICD-10-CM

## 2013-08-01 LAB — CBC
HCT: 30 % — ABNORMAL LOW (ref 39.0–52.0)
HEMOGLOBIN: 9.1 g/dL — AB (ref 13.0–17.0)
MCH: 25.9 pg — AB (ref 26.0–34.0)
MCHC: 30.3 g/dL (ref 30.0–36.0)
MCV: 85.5 fL (ref 78.0–100.0)
PLATELETS: 106 10*3/uL — AB (ref 150–400)
RBC: 3.51 MIL/uL — AB (ref 4.22–5.81)
RDW: 17.1 % — ABNORMAL HIGH (ref 11.5–15.5)
WBC: 6 10*3/uL (ref 4.0–10.5)

## 2013-08-01 LAB — COMPREHENSIVE METABOLIC PANEL
ALK PHOS: 77 U/L (ref 39–117)
ALT: 13 U/L (ref 0–53)
AST: 19 U/L (ref 0–37)
Albumin: 4.1 g/dL (ref 3.5–5.2)
BUN: 24 mg/dL — ABNORMAL HIGH (ref 6–23)
CO2: 27 mEq/L (ref 19–32)
Calcium: 9 mg/dL (ref 8.4–10.5)
Chloride: 100 mEq/L (ref 96–112)
Creatinine, Ser: 1.32 mg/dL (ref 0.50–1.35)
GFR calc non Af Amer: 55 mL/min — ABNORMAL LOW (ref >90)
GFR, EST AFRICAN AMERICAN: 64 mL/min — AB (ref >90)
GLUCOSE: 123 mg/dL — AB (ref 70–99)
POTASSIUM: 4.2 meq/L (ref 3.7–5.3)
Sodium: 142 mEq/L (ref 137–147)
TOTAL PROTEIN: 8.1 g/dL (ref 6.0–8.3)
Total Bilirubin: 1 mg/dL (ref 0.3–1.2)

## 2013-08-01 LAB — GLUCOSE, CAPILLARY
Glucose-Capillary: 204 mg/dL — ABNORMAL HIGH (ref 70–99)
Glucose-Capillary: 143 mg/dL — ABNORMAL HIGH (ref 70–99)

## 2013-08-01 MED ORDER — METOPROLOL TARTRATE 50 MG PO TABS: 25.0000 mg | ORAL_TABLET | Freq: Two times a day (BID) | ORAL | Status: DC

## 2013-08-01 NOTE — Progress Notes (Signed)
Pt discharged per MD order. All discharge instructions reviewed and all questions answered.  

## 2013-08-01 NOTE — Progress Notes (Signed)
He was already discharged before I was able to round on him, I agree with the [facility] note above.

## 2013-08-01 NOTE — Progress Notes (Signed)
Daily Rounding Note  08/01/2013, 11:18 AM  LOS: 2 days   SUBJECTIVE:       Dressed in street clothes, will be going home.  IV removed and dripping blood from left dorsal hand.  Feels well.  Stool dark but not tarry, very small volume this AM.  Eating well  OBJECTIVE:         Vital signs in last 24 hours:    Temp:  [97.5 F (36.4 C)-98.5 F (36.9 C)] 97.6 F (36.4 C) (01/08 0759) Pulse Rate:  [43-77] 63 (01/08 0759) Resp:  [14-19] 16 (01/08 0759) BP: (114-143)/(43-82) 119/47 mmHg (01/08 0759) SpO2:  [94 %-99 %] 99 % (01/08 0759) Last BM Date: 07/31/13 General: looks well   Heart: RRR Chest: clear bil.  No dyspnea Abdomen: obese, protuberant abdomen, soft, NT  Extremities: not examined Neuro/Psych:  Pleasant, oriented x 3 and relaxed.  Standing without difficulty.   Intake/Output from previous day: 01/07 0701 - 01/08 0700 In: 170 [P.O.:120; IV Piggyback:50] Out: 9562 [Urine:2300]  Intake/Output this shift:    Lab Results:  Recent Labs  07/31/13 0218 07/31/13 1845 08/01/13 0240  WBC 6.3 6.7 6.0  HGB 8.5* 8.9* 9.1*  HCT 27.4* 29.3* 30.0*  PLT 103* 115* 106*   BMET  Recent Labs  07/30/13 1309 07/31/13 0218 08/01/13 0240  NA 140 144 142  K 4.5 4.9 4.2  CL 100 104 100  CO2 23 25 27   GLUCOSE 189* 131* 123*  BUN 32* 31* 24*  CREATININE 1.64* 1.63* 1.32  CALCIUM 9.3 8.8 9.0   LFT  Recent Labs  07/30/13 1309 07/31/13 0218 08/01/13 0240  PROT 7.6 7.2 8.1  ALBUMIN 3.9 3.8 4.1  AST 18 18 19   ALT 13 12 13   ALKPHOS 72 73 77  BILITOT 0.8 1.0 1.0   PT/INR  Recent Labs  07/30/13 2030 07/31/13 0639  LABPROT 14.2 14.4  INR 1.12 1.14   Hepatitis Panel No results found for this basename: HEPBSAG, HCVAB, HEPAIGM, HEPBIGM,  in the last 72 hours  Studies/Results: US Abdomen Complete  07/31/2013   CLINICAL DATA:  Cirrhosis.  Thrombocytopenia.  EXAM: ULTRASOUND ABDOMEN COMPLETE  COMPARISON:  CT  abdomen and pelvis 03/09/2012 and abdominal ultrasound 02/06/2013.  FINDINGS: Gallbladder:  Small echogenic, non shadowing foci within the gallbladder are again seen and could be due to adherent stones or polyps. The gallbladder wall is mildly thickened at 0.5 cm, likely related to ascites. Sonographer reports negative Murphy's sign.  Common bile duct:  Diameter: 0.5 cm.  Liver:  The liver demonstrates a nodular border and increased echogenicity consistent with cirrhosis and fatty infiltration. There is hepatopetal flow in the portal vein. No focal liver lesion nor intrahepatic biliary ductal dilatation is identified. A small amount of perihepatic ascites is noted.  IVC:  No abnormality visualized.  Pancreas:  Visualized portion unremarkable.  Spleen:  Splinting volume is 732.7 cc consistent with splenomegaly. No focal splenic lesion.  Right Kidney:  Length: 12.9 cm. Echogenicity within normal limits. No mass or hydronephrosis visualized.  Left Kidney:  Length: 13.6 cm. Echogenicity within normal limits. No mass or hydronephrosis visualized.  Abdominal aorta:  No aneurysm visualized.  Other findings:  None.  IMPRESSION: Changes of cirrhosis with a small volume of abdominal ascites and splenomegaly.  Small gallstones adherent to the gallbladder wall versus polyps are unchanged. There is some gallbladder wall thickening which is likely related to ascites rather than cholecystitis. The appearance is  not markedly changed.   Electronically Signed   By: Inge Rise M.D.   On: 07/31/2013 08:14   Dg Chest Port 1 View  07/30/2013   CLINICAL DATA:  Short of breath  EXAM: PORTABLE CHEST - 1 VIEW  COMPARISON:  DG CHEST 2 VIEW dated 05/24/2013  FINDINGS: Sternotomy wires overlie enlarged silhouette. There is central venous pulmonary congestion. Overall pulmonary edema. Infiltrate or pneumothorax.  IMPRESSION: Cardiomegaly with central venous congestion. No overt pulmonary edema.   Electronically Signed   By: Suzy Bouchard M.D.   On: 07/30/2013 15:13    ASSESMENT:   *  Melena, resolved EGD 08/01/2013.  No blood or bleeding, gastritis with punctate ulcers, mild portal gastropathy, no varices.  On Prilosec 20 mg BID at home.   *  Anemia, normocytic. Acute blood loss.  No transfusions this admission.   *  AKI.  BUN/creat improved.   *  Thrombocytopenia.   *  Cirrhosis due to NASH .   *  04/2012 AVR, atrial fibrillation.  No Coumadin due to GI blleding.   *  IDDM     PLAN   *  Keep follow up visit with Dr Laural Golden, his primary GI MD.  Set for 11/13 though may want to reschedule for a longer interval, say early to mid February.  *  If H Pylori +, add abx. Continue chronic PPI.     [name]  08/01/2013, 11:18 AM Pager: [phone]

## 2013-08-01 NOTE — Discharge Summary (Signed)
Physician Discharge Summary  [name] [id number] DOB: Mar 02, 1946 DOA: 07/30/2013  PCP: [name], MD  Admit date: 07/30/2013 Discharge date: 08/01/2013  Time spent: >45 minutes  Recommendations for Outpatient Follow-up:  1. F/u on H pylori- to be checked on biopsy specimens  Discharge Diagnoses:  Active Problems:   GI bleeding   Unspecified gastritis and gastroduodenitis without mention of hemorrhage   Discharge Condition: stable  Diet recommendation: heart healthy- low sodium  Filed Weights   07/30/13 1310 07/30/13 1953  Weight: 127.007 kg (280 lb) 127.007 kg (280 lb)    History of present illness:  68 year old male with a history of severe aortic stenosis status post replacement October 2013, DM, atrial fibrillation, on no anticoagulation due to AVM and cirrhosis due to NASH, who presented to the ER with weakness, abdominal distention, and progressive dyspnea. Patient also had a guaiac positive stool. He felt that his stools had been darker for a couple of weeks. Hemoglobin of 9 at presentation - baseline 11-14. Platelet at baseline of 100 K.  Last endoscopy was done in 04/2011 per Dr. Benson Norway. He is now followed by Dr. Rogene Houston, MD in [place].  Patient stated that he had progressively become more dyspneic. He denied chest pain. He felt that his abdomin had become more distended despite doubling the dose of his Lasix. He denied any syncopal or near-syncopal episodes.  He has an extensive cardiac history that includes coronary artery disease status post CABG, permanent atrial fibrillation, diastolic heart failure, and aortic valve replacement for treatment of severe aortic stenosis. He is followed by Dr. Sherren Mocha.   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/ ascites  he is intolerant of Spirinolactone because of tender gynecomastia -  paracentesis on 1/7 with 1.5 L of amber colored fluid removed Patient's symptoms significantly improved   Acute on chronic diastolic heart failure  Has been diuresed adequately and is now with a pulse ox of 99% on RA  Relative hypotension  Held B Blocker- will resume at 1/2 dose (25 mg BID) today to prevent recurrence of A-fib with RVR  Permanent Afib  Not an anticoagulation because of history of GI bleeding, AVM  - as mentioned above Metoprolol being resumed at 25 mg BID- HR in 80-90 range at this time - Verapamil and Digoxin discontinued for now - note Dig level was 2.2 at current dose  Severe AoS s/p AVR  s/p tissue AVR 10/13   DM  CBG reasonably controlled   CAD s/p CABG 2004  patent grafts at cath 2013   COPD  Well compensated at this time   Procedures:  1/7 paracentesis 1.5 L   Consultations:  GI  Discharge Exam: Filed Vitals:   08/01/13 0759  BP: 119/47  Pulse: 63  Temp: 97.6 F (36.4 C)  Resp: 16    General: AAO x 3 Cardiovascular: RRR, no murmurs Respiratory: CTA b/l   Discharge Instructions  Discharge Orders   Future Appointments Provider Department Dept Phone   08/06/2013 10:00 AM Rogene Houston, MD [place] [phone]   09/23/2013 9:45 AM Sherren Mocha, MD [place] Office [phone]   Future Orders Complete By Expires  Diet - low sodium heart healthy  As directed    Increase activity slowly  As directed        Medication List    STOP taking these medications       aspirin 81 MG EC tablet     digoxin 0.25 MG tablet  Commonly known as:  LANOXIN     verapamil 80 MG tablet  Commonly known as:  CALAN      TAKE these medications       atorvastatin 20 MG tablet  Commonly known as:  LIPITOR  Take 20 mg by mouth at bedtime.     ferrous gluconate 325 MG tablet  Commonly known as:   FERGON  Take 325 mg by mouth 3 (three) times daily with meals.     furosemide 80 MG tablet  Commonly known as:  LASIX  Take 1 tablet (80 mg total) by mouth 2 (two) times daily.     insulin aspart 100 UNIT/ML injection  Commonly known as:  novoLOG  Inject 8-14 Units into the skin 3 (three) times daily with meals.     insulin NPH Human 100 UNIT/ML injection  Commonly known as:  HUMULIN N,NOVOLIN N  Inject 25 Units into the skin 2 (two) times daily before a meal.     loratadine 10 MG tablet  Commonly known as:  CLARITIN  Take 10 mg by mouth daily.     metFORMIN 1000 MG tablet  Commonly known as:  GLUCOPHAGE  Take 1,000 mg by mouth 2 (two) times daily with a meal.     metolazone 2.5 MG tablet  Commonly known as:  ZAROXOLYN  Take one tablet by mouth 30 minutes prior to your morning dose of Furosemide on Tuesday and Friday     metoprolol 50 MG tablet  Commonly known as:  LOPRESSOR  Take 0.5 tablets (25 mg total) by mouth 2 (two) times daily.     multivitamin tablet  Take 1 tablet by mouth daily. spectravite     olmesartan 20 MG tablet  Commonly known as:  BENICAR  Take 1 tablet (20 mg total) by mouth daily.     omeprazole 20 MG capsule  Commonly known as:  PRILOSEC  Take 20 mg by mouth 2 (two) times daily.     potassium chloride 10 MEQ tablet  Commonly known as:  K-DUR,KLOR-CON  Take 30 mEq by mouth 2 (two) times daily.     vitamin C 1000 MG tablet  Take 1,000 mg by mouth 2 (two) times daily.       Allergies  Allergen Reactions  . Codeine Other (See Comments)    Hurting in chest  . Diltiazem Hcl Itching  . Niacin Other (See Comments)    Hot flashes      The results of significant diagnostics from this hospitalization (including imaging, microbiology, ancillary and laboratory) are listed below for reference.    Significant Diagnostic Studies: US Abdomen Complete  07/31/2013   CLINICAL DATA:  Cirrhosis.  Thrombocytopenia.  EXAM: ULTRASOUND ABDOMEN COMPLETE   COMPARISON:  CT abdomen and pelvis 03/09/2012 and abdominal ultrasound 02/06/2013.  FINDINGS: Gallbladder:  Small echogenic, non shadowing foci within the gallbladder are again seen and could be due to adherent stones or polyps. The gallbladder wall is mildly thickened at 0.5 cm, likely related to ascites. Sonographer reports negative Murphy's sign.  Common bile duct:  Diameter: 0.5 cm.  Liver:  The liver demonstrates a nodular border and increased echogenicity consistent with cirrhosis and fatty  infiltration. There is hepatopetal flow in the portal vein. No focal liver lesion nor intrahepatic biliary ductal dilatation is identified. A small amount of perihepatic ascites is noted.  IVC:  No abnormality visualized.  Pancreas:  Visualized portion unremarkable.  Spleen:  Splinting volume is 732.7 cc consistent with splenomegaly. No focal splenic lesion.  Right Kidney:  Length: 12.9 cm. Echogenicity within normal limits. No mass or hydronephrosis visualized.  Left Kidney:  Length: 13.6 cm. Echogenicity within normal limits. No mass or hydronephrosis visualized.  Abdominal aorta:  No aneurysm visualized.  Other findings:  None.  IMPRESSION: Changes of cirrhosis with a small volume of abdominal ascites and splenomegaly.  Small gallstones adherent to the gallbladder wall versus polyps are unchanged. There is some gallbladder wall thickening which is likely related to ascites rather than cholecystitis. The appearance is not markedly changed.   Electronically Signed   By: Inge Rise M.D.   On: 07/31/2013 08:14   Dg Chest Port 1 View  07/30/2013   CLINICAL DATA:  Short of breath  EXAM: PORTABLE CHEST - 1 VIEW  COMPARISON:  DG CHEST 2 VIEW dated 05/24/2013  FINDINGS: Sternotomy wires overlie enlarged silhouette. There is central venous pulmonary congestion. Overall pulmonary edema. Infiltrate or pneumothorax.  IMPRESSION: Cardiomegaly with central venous congestion. No overt pulmonary edema.   Electronically Signed    By: Suzy Bouchard M.D.   On: 07/30/2013 15:13    Microbiology: Recent Results (from the past 240 hour(s))  MRSA PCR SCREENING     Status: None   Collection Time    07/30/13  8:12 PM      Result Value Range Status   MRSA by PCR NEGATIVE  NEGATIVE Final   Comment:            The GeneXpert MRSA Assay (FDA     approved for NASAL specimens     only), is one component of a     comprehensive MRSA colonization     surveillance program. It is not     intended to diagnose MRSA     infection nor to guide or     monitor treatment for     MRSA infections.     Labs: Basic Metabolic Panel:  Recent Labs Lab 07/26/13 0942 07/30/13 1309 07/30/13 2030 07/31/13 0218 08/01/13 0240  NA 140 140  --  144 142  K 4.0 4.5  --  4.9 4.2  CL 103 100  --  104 100  CO2 27 23  --  25 27  GLUCOSE 165* 189*  --  131* 123*  BUN 32* 32*  --  31* 24*  CREATININE 1.4 1.64*  --  1.63* 1.32  CALCIUM 9.2 9.3  --  8.8 9.0  MG  --   --  1.9  --   --    Liver Function Tests:  Recent Labs Lab 07/30/13 1309 07/31/13 0218 08/01/13 0240  AST 18 18 19   ALT 13 12 13   ALKPHOS 72 73 77  BILITOT 0.8 1.0 1.0  PROT 7.6 7.2 8.1  ALBUMIN 3.9 3.8 4.1   No results found for this basename: LIPASE, AMYLASE,  in the last 168 hours No results found for this basename: AMMONIA,  in the last 168 hours CBC:  Recent Labs Lab 07/30/13 1309 07/31/13 0218 07/31/13 1845 08/01/13 0240  WBC 8.7 6.3 6.7 6.0  HGB 9.0* 8.5* 8.9* 9.1*  HCT 29.2* 27.4* 29.3* 30.0*  MCV 84.9 85.1 85.2 85.5  PLT 119* 103* 115*  106*   Cardiac Enzymes:  Recent Labs Lab 07/30/13 2030 07/31/13 0218  TROPONINI <0.30 <0.30   BNP: BNP (last 3 results)  Recent Labs  03/06/13 1222 05/24/13 1240 07/30/13 1309  PROBNP 95.0 142.0* 1166.0*   CBG:  Recent Labs Lab 07/31/13 0813 07/31/13 1136 07/31/13 1633 07/31/13 2144 08/01/13 0800  GLUCAP 149* 155* 185* 204* 143*       Signed:  [place]  [facility] 08/01/2013, 1:24 PM

## 2013-08-06 ENCOUNTER — Other Ambulatory Visit (INDEPENDENT_AMBULATORY_CARE_PROVIDER_SITE_OTHER): Payer: Self-pay

## 2013-08-06 ENCOUNTER — Encounter (INDEPENDENT_AMBULATORY_CARE_PROVIDER_SITE_OTHER): Payer: Self-pay | Admitting: Internal Medicine

## 2013-08-06 ENCOUNTER — Ambulatory Visit (INDEPENDENT_AMBULATORY_CARE_PROVIDER_SITE_OTHER): Payer: Medicare Other | Admitting: Internal Medicine

## 2013-08-06 VITALS — BP 124/70 | HR 72 | Temp 97.8°F | Resp 16 | Ht 68.0 in | Wt 266.6 lb

## 2013-08-06 DIAGNOSIS — D649 Anemia, unspecified: Secondary | ICD-10-CM

## 2013-08-06 DIAGNOSIS — K2990 Gastroduodenitis, unspecified, without bleeding: Secondary | ICD-10-CM

## 2013-08-06 DIAGNOSIS — R188 Other ascites: Secondary | ICD-10-CM | POA: Diagnosis not present

## 2013-08-06 DIAGNOSIS — K7460 Unspecified cirrhosis of liver: Secondary | ICD-10-CM

## 2013-08-06 DIAGNOSIS — K2970 Gastritis, unspecified, without bleeding: Secondary | ICD-10-CM

## 2013-08-06 NOTE — Progress Notes (Signed)
Presenting complaint;  Followup for cirrhosis, ascites and anemia.  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 He was admitted to [facility] on 07/30/2013 and discharge on 08/01/2013. He reports that he did not feel well for 2 weeks prior to that admission he developed abdominal distention and dark stools. He was seen by his primary care physician and sent over to the emergency room where he was noted to have hemoglobin of of 9 g. Last year and his hemoglobin ranged between 11.9 and 14 g. He underwent EGD by Dr. Oretha Caprice and found to have mild portal gastropathy and gastritis. Biopsy revealed mild to moderate chronic inactive gastritis and H. pylori stains are negative. He also underwent ultrasound-guided abdominal paracenteses with removal of 1500 mL of fluid. His hemoglobin dropped to 8.5 it jumped back to 9.1 on the day of discharge.  Subjective;  He feels much better. He states since he had abdominal He's been able to breathe better. He has very good appetite but he is doing his best to eat less. He has lost 11 pounds since his last visit 6 months ago. He tells me that he weighed 280 pounds at Dr. Olen Pel office on 07/30/2013. He is having some difficulty voiding. He believes he has enlarged prostate. He has an appointment with Dr. Doyle Askew later this week and will discuss treatment options. He states his stools are dark poses an iron. He had single episode of blood on the tissue one week ago. He denies frank rectal bleeding. He states he is not able to walk more than he has been in the past.   Current Medications: Current Outpatient Prescriptions  Medication Sig Dispense Refill  . Ascorbic Acid (VITAMIN C) 1000 MG tablet Take 1,000 mg by mouth 2 (two) times daily.      [name] atorvastatin (LIPITOR) 20 MG tablet Take 20 mg by mouth at bedtime.       . ferrous gluconate (FERGON) 325  MG tablet Take 325 mg by mouth 2 (two) times daily.       . furosemide (LASIX) 80 MG tablet Take 1 tablet (80 mg total) by mouth 2 (two) times daily.  60 tablet  6  . insulin aspart (NOVOLOG) 100 UNIT/ML injection Inject 8-14 Units into the skin 3 (three) times daily with meals.       . metFORMIN (GLUCOPHAGE) 1000 MG tablet Take 1,000 mg by mouth 2 (two) times daily with a meal.       . metolazone (ZAROXOLYN) 2.5 MG tablet Take one tablet by mouth 30 minutes prior to your morning dose of Furosemide on Tuesday and Friday  30 tablet  3  . metoprolol (LOPRESSOR) 50 MG tablet Take 0.5 tablets (25 mg total) by mouth 2 (two) times daily.  60 tablet  1  . Multiple Vitamin (MULTIVITAMIN) tablet Take 1 tablet by mouth daily. spectravite      . olmesartan (BENICAR) 20 MG tablet Take 1 tablet (20 mg total) by mouth daily.  90 tablet  2  . omeprazole (PRILOSEC) 20 MG capsule Take 20 mg by mouth 2 (two) times daily.       . potassium chloride (K-DUR,KLOR-CON) 10 MEQ tablet Take 30 mEq by mouth 2 (two) times daily.      [name] loratadine (CLARITIN) 10 MG tablet Take 10 mg by mouth daily.       No current facility-administered medications for this  visit.     Objective: Blood pressure 124/70, pulse 72, temperature 97.8 F (36.6 C), temperature source Oral, resp. rate 16, height 5\' 8"  (1.727 m), weight 266 lb 9.6 oz (120.929 kg). Patient is alert and does not have asterixis. Conjunctiva is pink. Sclera is nonicteric Oropharyngeal mucosa is normal. No neck masses or thyromegaly noted. Cardiac exam with regular rhythm normal S1 and S2. He has grade 6-2/9 systolic ejection murmur best heard at aortic area. Lungs are clear to auscultation. Abdomen is protuberant. Abdomen is soft with easily palpable and firm left lobe of the liver. Spleen is not palpable. Shifting dullness is absent.  No LE edema or clubbing noted.  Labs/studies Results: Lab data from recent admission reviewed. Serum albumin on 08/01/2013 was  4.1. AST 19 and ALT 13. Serum creatinine was 1.32. Iron studies from 07/30/2013 reveals ceramide of 23, TIBC of 500 and saturation of 5% and ferritin was 15. B12 level was 632 and folate greater than 20. Ultrasound was negative for focal hepatic abnormalities.  Assessment:  #1. Iron deficiency anemia. He has history of GI bleed secondary to small bowel angiodysplasia documented on studies prior to aortic valve replacement. His hemoglobin last year was within normal limits. He possibly bled from small bowel resulting in drop in H&H. He is on low dose aspirin which may be contribution into his problem but the benefit outweighs the risk. He does not need further workup other than monitoring his H&H. #2. Cirrhosis secondary to NAFLD. Recent serum albumin was normal but he has ascites and portal gastropathy. Therefore he is having more hemodynamic issues rather than hepatic function do to chronic liver. Renal function will need to be monitored closely while he is on furosemide and metolazone. May consider spinolactone if ascites become significant again. #3. Obesity. He has lost 11 pounds in the last 6 months but he must do more to improve diagnoses from liver standpoint.    Plan:  Patient will go to the lab on 52/84/1324 for CBC metabolic 7 and AFP. Continue ferrous gluconate at current dose. Resume aspirin in one week. Continue ferrous gluconate at current dose. Patient also advised to keep weight starry and call if his weight goes up or down by more than 7 pounds. Office visit in 3 months.

## 2013-08-06 NOTE — Patient Instructions (Signed)
Daily weights as discussed. Call if you lose or gain by more than 7 pounds. Can resume low-dose aspirin in one week.

## 2013-08-07 ENCOUNTER — Other Ambulatory Visit: Payer: Self-pay | Admitting: Cardiovascular Disease

## 2013-08-09 DIAGNOSIS — R188 Other ascites: Secondary | ICD-10-CM | POA: Diagnosis not present

## 2013-08-09 DIAGNOSIS — D649 Anemia, unspecified: Secondary | ICD-10-CM | POA: Diagnosis not present

## 2013-08-09 DIAGNOSIS — K7460 Unspecified cirrhosis of liver: Secondary | ICD-10-CM | POA: Diagnosis not present

## 2013-08-09 DIAGNOSIS — K922 Gastrointestinal hemorrhage, unspecified: Secondary | ICD-10-CM | POA: Diagnosis not present

## 2013-08-20 ENCOUNTER — Other Ambulatory Visit: Payer: Self-pay | Admitting: Cardiovascular Disease

## 2013-08-20 DIAGNOSIS — IMO0001 Reserved for inherently not codable concepts without codable children: Secondary | ICD-10-CM | POA: Diagnosis not present

## 2013-08-20 DIAGNOSIS — E7800 Pure hypercholesterolemia, unspecified: Secondary | ICD-10-CM | POA: Diagnosis not present

## 2013-08-20 DIAGNOSIS — I10 Essential (primary) hypertension: Secondary | ICD-10-CM | POA: Diagnosis not present

## 2013-08-29 ENCOUNTER — Telehealth (INDEPENDENT_AMBULATORY_CARE_PROVIDER_SITE_OTHER): Payer: Self-pay | Admitting: *Deleted

## 2013-08-29 DIAGNOSIS — D649 Anemia, unspecified: Secondary | ICD-10-CM

## 2013-08-29 NOTE — Telephone Encounter (Signed)
Per Dr.Rehman the patient will need to have labs drawn in 4 weeks, noted for 09/23/13.

## 2013-09-18 ENCOUNTER — Other Ambulatory Visit: Payer: Self-pay | Admitting: *Deleted

## 2013-09-18 ENCOUNTER — Encounter (INDEPENDENT_AMBULATORY_CARE_PROVIDER_SITE_OTHER): Payer: Self-pay | Admitting: *Deleted

## 2013-09-18 ENCOUNTER — Other Ambulatory Visit (INDEPENDENT_AMBULATORY_CARE_PROVIDER_SITE_OTHER): Payer: Self-pay | Admitting: *Deleted

## 2013-09-18 ENCOUNTER — Other Ambulatory Visit: Payer: Self-pay | Admitting: Nurse Practitioner

## 2013-09-18 ENCOUNTER — Other Ambulatory Visit: Payer: Self-pay | Admitting: Cardiovascular Disease

## 2013-09-18 DIAGNOSIS — D649 Anemia, unspecified: Secondary | ICD-10-CM

## 2013-09-18 NOTE — Telephone Encounter (Signed)
Error

## 2013-09-23 ENCOUNTER — Encounter: Payer: Self-pay | Admitting: Cardiovascular Disease

## 2013-09-23 ENCOUNTER — Other Ambulatory Visit: Payer: Managed Care, Other (non HMO)

## 2013-09-23 ENCOUNTER — Ambulatory Visit (INDEPENDENT_AMBULATORY_CARE_PROVIDER_SITE_OTHER): Payer: Medicare Other | Admitting: Cardiovascular Disease

## 2013-09-23 VITALS — BP 126/72 | HR 72 | Ht 68.0 in | Wt 280.0 lb

## 2013-09-23 DIAGNOSIS — I359 Nonrheumatic aortic valve disorder, unspecified: Secondary | ICD-10-CM | POA: Diagnosis not present

## 2013-09-23 DIAGNOSIS — I509 Heart failure, unspecified: Secondary | ICD-10-CM

## 2013-09-23 DIAGNOSIS — E785 Hyperlipidemia, unspecified: Secondary | ICD-10-CM | POA: Diagnosis not present

## 2013-09-23 DIAGNOSIS — I10 Essential (primary) hypertension: Secondary | ICD-10-CM | POA: Diagnosis not present

## 2013-09-23 DIAGNOSIS — I4891 Unspecified atrial fibrillation: Secondary | ICD-10-CM

## 2013-09-23 DIAGNOSIS — D649 Anemia, unspecified: Secondary | ICD-10-CM | POA: Diagnosis not present

## 2013-09-23 DIAGNOSIS — I350 Nonrheumatic aortic (valve) stenosis: Secondary | ICD-10-CM

## 2013-09-23 DIAGNOSIS — I25810 Atherosclerosis of coronary artery bypass graft(s) without angina pectoris: Secondary | ICD-10-CM | POA: Diagnosis not present

## 2013-09-23 DIAGNOSIS — I5032 Chronic diastolic (congestive) heart failure: Secondary | ICD-10-CM

## 2013-09-23 MED ORDER — METOPROLOL TARTRATE 50 MG PO TABS: 50.0000 mg | ORAL_TABLET | Freq: Two times a day (BID) | ORAL | Status: DC

## 2013-09-23 MED ORDER — METOLAZONE 5 MG PO TABS: ORAL_TABLET | ORAL | Status: DC

## 2013-09-23 MED ORDER — LOSARTAN POTASSIUM 100 MG PO TABS: 100.0000 mg | ORAL_TABLET | Freq: Every day | ORAL | Status: DC

## 2013-09-23 MED ORDER — TORSEMIDE 20 MG PO TABS: 60.0000 mg | ORAL_TABLET | Freq: Two times a day (BID) | ORAL | Status: DC

## 2013-09-23 NOTE — Progress Notes (Signed)
HPI:   68 year old gentleman presenting for followup evaluation. The patient has an extensive cardiac history that includes coronary artery disease status post CABG, permanent atrial fibrillation, diastolic heart failure, and redo cardiac surgery with aortic valve replacement for treatment of severe aortic stenosis. He's also had significant gastrointestinal bleeding from arteriovenous malformations. 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 He underwent paracentesis last in January 2015. During that hospitalization he was given IV diuretics and felt great after diuresis.  Last lipids 02/2013: Chol 100, Trig 157, HDL 31, LDL 38.  Since his return home from the hospital, his weight has increased back to his baseline. He continues to struggle with exertional dyspnea and abdominal bloating. He denies leg swelling, orthopnea, or PND. He's had no chest pain or pressure. He does not lose weight on the days that he takes metolazone. During his hospitalization he also complained of black stools. His hemoglobin dropped to about 9 with his baseline noted to be in the range of 11-12. During his hospitalization, aspirin, digoxin, verapamil were also discontinued. His metoprolol was cut back to 25 mg twice daily.  Outpatient Encounter Prescriptions as of 09/23/2013  Medication Sig  . Ascorbic Acid (VITAMIN C) 1000 MG tablet Take 1,000 mg by mouth 2 (two) times daily.  [name] atorvastatin (LIPITOR) 20 MG tablet Take 20 mg by mouth at bedtime.   [name] BENICAR 20 MG tablet TAKE 1 TABLET BY MOUTH EVERY DAY  . ferrous gluconate (FERGON) 325 MG tablet Take 325 mg by mouth 2 (two) times daily.   . furosemide (LASIX) 80 MG tablet Take 1 tablet (80 mg total) by mouth 2 (two) times daily.  . furosemide (LASIX) 80 MG tablet TAKE 1 TABLET BY MOUTH TWICE A DAY  . insulin aspart (NOVOLOG) 100 UNIT/ML injection Inject 8-14 Units into the skin 3 (three) times daily with meals.   [name] KLOR-CON M10  10 MEQ tablet TAKE 3 TABLETS BY MOUTH TWICE A DAY  . metFORMIN (GLUCOPHAGE) 1000 MG tablet Take 1,000 mg by mouth 2 (two) times daily with a meal.   . metolazone (ZAROXOLYN) 2.5 MG tablet Take one tablet by mouth 30 minutes prior to your morning dose of Furosemide on Tuesday and Friday  . metoprolol (LOPRESSOR) 50 MG tablet Take 0.5 tablets (25 mg total) by mouth 2 (two) times daily.  . Multiple Vitamin (MULTIVITAMIN) tablet Take 1 tablet by mouth daily. spectravite  . olmesartan (BENICAR) 20 MG tablet Take 1 tablet (20 mg total) by mouth daily.  [name] omeprazole (PRILOSEC) 20 MG capsule Take 20 mg by mouth 2 (two) times daily.   . potassium chloride (K-DUR,KLOR-CON) 10 MEQ tablet Take 30 mEq by mouth 2 (two) times daily.  . [DISCONTINUED] loratadine (CLARITIN) 10 MG tablet Take 10 mg by mouth daily.    Allergies  Allergen Reactions  . Codeine Other (See Comments)    Hurting in chest  . Diltiazem Hcl Itching  . Niacin Other (See Comments)    Hot flashes    Past Medical History  Diagnosis Date  . Overweight   . CAD (coronary artery disease)     a. s/p CABG 2004;  b. [place] 10/13:  LHC 8/13: Free radial to obtuse marginal patent, SVG-diagonal patent, LIMA-LAD patent, EF 65-70%, mean aortic valve gradient 42  . Atrial fibrillation     Permanent; off of Coumadin for now due to GI bleed  . DM2 (diabetes mellitus, type 2)   . Insomnia   . Carotid bruit  06/14/2011    a. pre-AVR dopplers 10/13: no sig ICA stenosis  . Hypertension   . Chronic diastolic heart failure   . GERD (gastroesophageal reflux disease)   . COPD (chronic obstructive pulmonary disease)   . Iron deficiency anemia     Requiring intravenous iron  . H/O hiatal hernia   . AVM (arteriovenous malformation)     Recurrent GI bleeding requiring multiple transfusions  . Hyperlipidemia   . Thrombocytopenia   . Ascites     status post paracentesis with removal of 3.4 L of ascitic fluid  . Osteoarthritis   . Mediastinal adenopathy  09/22/2011  . Cirrhosis   . Iron deficiency anemia   . Mediastinal adenopathy 09/22/2011  . Aortic stenosis 03/08/2012    a.  s/p tissue AVR 10/13 with Dr. [name];   b. Echo 10/13: mod LVH, EF 55-60%, tissue AVR not well seen, no leak, gradient not too high (mean 11mmHg), MAC, mild MR, mild LAE, PASP 38  . OSA (obstructive sleep apnea) 1999    DOES NOT USE CPAP    ROS: Negative except as per HPI  BP 126/72  Pulse 72  Ht 5\' 8"  (1.727 m)  Wt 280 lb (127.007 kg)  BMI 42.58 kg/m2  PHYSICAL EXAM: Pt is alert and oriented, pleasant obese male in NAD HEENT: normal Neck: JVP - moderately increased, carotids 2+= without bruits Lungs: CTA bilaterally CV: Tachycardic and irregular without murmur or gallop Abd: soft, NT, Positive BS, obese Ext: no C/C/E, distal pulses intact and equal Skin: warm/dry no rash  EKG: Atrial fibrillation with RVR, heart rate 115 beats per minute, right bundle branch block, left axis deviation.  2-D echocardiogram 03/08/2013:  Study Conclusions  - Left ventricle: The cavity size was mildly dilated. Wall thickness was increased in a pattern of mild LVH. Systolic function was normal. The estimated ejection fraction was in the range of 55% to 60%. - Aortic valve: AV posthesis is difficult to see.Peak and mean gradients through the valve are 40 and 22 mm Hg respectively . This is relatively unchanged from echo of 1 year ago. - Right ventricle: Systolic function was mildly reduced. - Right atrium: The atrium was mildly dilated. - Pulmonary arteries: PA peak pressure: 4mm Hg (S). Impressions:  - No significant change from echo of 2013.   ASSESSMENT AND PLAN: 1. Atrial fibrillation. Heart rate is elevated today. His medications were reduced during his hospitalization because of bradycardia. Will increase metoprolol back to 50 mg twice daily. He is not a candidate for anticoagulation with cirrhosis and AVMs with recurrent GI bleeding.  2. Acute on chronic  diastolic heart failure. He has recurrent volume overload after paracentesis. Suspect this is multifactorial with diastolic heart failure and cirrhosis. Will change furosemide to torsemide 60 mg twice daily. Continue metolazone but increase to 5 mg Tuesday and Friday am. I think he would benefit from evaluation in the heart failure clinic. Note he is not a candidate for spironolactone as he has been intolerant in the past with painful gynecomastia.  3. Aortic stenosis s/p AVR. Stable by exam and serial echo.   4. Cirrhosis secondary to NASH. May require repeat paracentesis if continued clinical decline.  5. HTN - BP controlled. Change benicar to losartan (cost).  Will schedule for 3 months follow-up, which could be moved out further if followed closely in the CHF clinic.   Sherren Mocha 09/23/2013 10:45 AM

## 2013-09-23 NOTE — Patient Instructions (Addendum)
Your physician has recommended you make the following change in your medication:  STOP Lasix START Torsemide 60 mg (3 20 mg pills) twice daily INCREASE Metoprolol (Lopressor) to 50 mg twice daily STOP Olmesartan (Benicar) START Losartan 100 mg daily INCREASE Metolazone to 5 mg on Tuesdays and Fridays  You have been referred to Heart Failure Clinic at [facility]  Your physician recommends that you return for lab work in: 2 weeks - BMET  Your physician recommends that you schedule a follow-up appointment in: 3 months with Dr. Burt Knack

## 2013-09-23 NOTE — Addendum Note (Signed)
Addended by: Emmaline Life on: 09/23/2013 11:47 AM   Modules accepted: Orders

## 2013-09-24 LAB — CBC
HEMATOCRIT: 32.3 % — AB (ref 39.0–52.0)
HEMOGLOBIN: 9.8 g/dL — AB (ref 13.0–17.0)
MCH: 25.5 pg — ABNORMAL LOW (ref 26.0–34.0)
MCHC: 30.3 g/dL (ref 30.0–36.0)
MCV: 84.1 fL (ref 78.0–100.0)
Platelets: 129 10*3/uL — ABNORMAL LOW (ref 150–400)
RBC: 3.84 MIL/uL — AB (ref 4.22–5.81)
RDW: 16.7 % — ABNORMAL HIGH (ref 11.5–15.5)
WBC: 5.4 10*3/uL (ref 4.0–10.5)

## 2013-09-25 DIAGNOSIS — E119 Type 2 diabetes mellitus without complications: Secondary | ICD-10-CM | POA: Diagnosis not present

## 2013-09-25 DIAGNOSIS — H2510 Age-related nuclear cataract, unspecified eye: Secondary | ICD-10-CM | POA: Diagnosis not present

## 2013-10-07 ENCOUNTER — Other Ambulatory Visit (INDEPENDENT_AMBULATORY_CARE_PROVIDER_SITE_OTHER): Payer: Medicare Other

## 2013-10-07 DIAGNOSIS — I5032 Chronic diastolic (congestive) heart failure: Secondary | ICD-10-CM

## 2013-10-07 DIAGNOSIS — I25810 Atherosclerosis of coronary artery bypass graft(s) without angina pectoris: Secondary | ICD-10-CM

## 2013-10-07 DIAGNOSIS — I10 Essential (primary) hypertension: Secondary | ICD-10-CM

## 2013-10-07 DIAGNOSIS — I509 Heart failure, unspecified: Secondary | ICD-10-CM | POA: Diagnosis not present

## 2013-10-07 LAB — BASIC METABOLIC PANEL
BUN: 59 mg/dL — AB (ref 6–23)
CO2: 30 mEq/L (ref 19–32)
CREATININE: 1.7 mg/dL — AB (ref 0.4–1.5)
Calcium: 9.7 mg/dL (ref 8.4–10.5)
Chloride: 94 mEq/L — ABNORMAL LOW (ref 96–112)
GFR: 44.04 mL/min — AB (ref >60.00)
Glucose, Bld: 150 mg/dL — ABNORMAL HIGH (ref 70–99)
POTASSIUM: 3.4 meq/L — AB (ref 3.5–5.1)
Sodium: 137 mEq/L (ref 135–145)

## 2013-10-08 ENCOUNTER — Encounter (HOSPITAL_COMMUNITY): Payer: Self-pay

## 2013-10-08 ENCOUNTER — Ambulatory Visit (HOSPITAL_COMMUNITY)
Admission: RE | Admit: 2013-10-08 | Discharge: 2013-10-08 | Disposition: A | Discharge status: Home / Self Care | Payer: Medicare Other | Source: Ambulatory Visit | Attending: Internal Medicine | Admitting: Internal Medicine

## 2013-10-08 VITALS — BP 128/64 | HR 128 | Ht 68.0 in | Wt 273.0 lb

## 2013-10-08 DIAGNOSIS — I5032 Chronic diastolic (congestive) heart failure: Secondary | ICD-10-CM | POA: Insufficient documentation

## 2013-10-08 DIAGNOSIS — I359 Nonrheumatic aortic valve disorder, unspecified: Secondary | ICD-10-CM | POA: Insufficient documentation

## 2013-10-08 DIAGNOSIS — I25810 Atherosclerosis of coronary artery bypass graft(s) without angina pectoris: Secondary | ICD-10-CM | POA: Diagnosis not present

## 2013-10-08 DIAGNOSIS — I350 Nonrheumatic aortic (valve) stenosis: Secondary | ICD-10-CM

## 2013-10-08 DIAGNOSIS — Z951 Presence of aortocoronary bypass graft: Secondary | ICD-10-CM

## 2013-10-08 DIAGNOSIS — K5520 Angiodysplasia of colon without hemorrhage: Secondary | ICD-10-CM | POA: Diagnosis not present

## 2013-10-08 DIAGNOSIS — I4891 Unspecified atrial fibrillation: Secondary | ICD-10-CM | POA: Diagnosis not present

## 2013-10-08 DIAGNOSIS — Q2733 Arteriovenous malformation of digestive system vessel: Secondary | ICD-10-CM

## 2013-10-08 DIAGNOSIS — I509 Heart failure, unspecified: Secondary | ICD-10-CM

## 2013-10-08 DIAGNOSIS — I4821 Permanent atrial fibrillation: Secondary | ICD-10-CM

## 2013-10-08 MED ORDER — METOPROLOL SUCCINATE ER 50 MG PO TB24
50.0000 mg | ORAL_TABLET | Freq: Two times a day (BID) | ORAL | Status: DC
Start: 2013-10-08 — End: 2013-10-22

## 2013-10-08 MED ORDER — METOLAZONE 2.5 MG PO TABS: 2.5000 mg | ORAL_TABLET | ORAL | Status: DC

## 2013-10-08 NOTE — Progress Notes (Signed)
Patient ID: [name], male   DOB: August 12, 1945, 68 y.o.   MRN: [id number] Primary Cardiologist: Dr. Burt Knack  68 yo with history of CAD s/p CABG, permanent atrial fibrillation, chronic diastolic CHF, severe AS s/p bioprosthetic AVR presents for evaluation of diastolic CHF.  Last LHC in 8/13 showed patent grafts.  AVR was in 10/13.  Last echo in 8/14 showed EF 55-60%.  Patient recently saw Dr. Burt Knack and was noted to be volume overloaded and tachycardic.  His Lasix was changed to torsemide, and he was continued on metolazone twice a week. Since then, he has lost 9 lbs.  He still has exertional dyspnea but is improved.  He was able to pull the trash can down to the road yesterday without dyspnea.  He can walk up a hill.  He does get dyspneic carrying groceries in the house.  He has chronic orthopnea and keeps the head of his bed raised.  No chest pain.  Creatinine rose to 1.7 with more aggressive diuresis.   Labs (8/14): LDL 38 Labs (3/15): K 3.4, creatinine 1.32 => 1.7, BUN 55   PMH: 1. CAD: s/p CABG 2004. Last LHC (8/13) with patent free radial-OM, patent SVG-D, patent LIMA-LAD.  2. Permanent atrial fibrillation: Not anticoagulated due to GI bleeding.  3. Chronic diastolic CHF: Echo (9/48) with EF 55-60%, bioprosthetic aortic valve with mean gradient 22 mmHg, mildly decreased RV systolic function.   4. Severe aortic stenosis s/p bioprosthetic AVR in 10/13. Mean gradient 22 mmHg across valve on echo in 8/14.  5. NASH: ascites, thrombocytopenia.  Had paracentesis in 1/15. Gynecomastia with spironolactone.  6. 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   7. Hyperlipidemia 8. HTN 9. GERD 10. COPD 25. OSA: Cannot tolerate CPAP.  12. CKD  SH: Lives with wife in [place], prior smoker.   FH: CAD  ROS: All systems reviewed and negative except as per HPI.   BP 128/64  Pulse 128  Ht 5\' 8"  (1.727 m)  Wt 273 lb (123.832 kg)  BMI 41.52 kg/m2  SpO2 96% General: NAD Neck: JVP 9-10 cm, no thyromegaly  or thyroid nodule.  Lungs: Clear to auscultation bilaterally with normal respiratory effort. CV: Nondisplaced PMI.  Heart mildly tachy, irregular S1/S2, no S3/S4, 2/6 early SEM RUSB.  No peripheral edema.  No carotid bruit.  Normal pedal pulses.  Abdomen: Soft, nontender, no hepatosplenomegaly, no distention.  Skin: Intact without lesions or rashes.  Neurologic: Alert and oriented x 3.  Psych: Normal affect. Extremities: No clubbing or cyanosis.  HEENT: Normal.   Assessment/Plan: 1. Chronic diastolic CHF:  Patient is clearly still volume overloaded on exam but creatinine has worsened.  He has diuresed well with transition to torsemide.  However, BUN and creatinine rose with this change. - Continue toresmide at current dose. - Hold metolazone x 2 doses (Tuesday/Friday of this week) then start taking it 2.5 mg 30 minutes prior to morning Lasix once a week on Fridays.   - Will follow closely.  BMET/BNP in 1 week and CHF clinic followup in 2 wks.  2. Chronic atrial fibrillation: He is not anticoagulated due to history of GI bleeding from AVMS.  HR is a bit high currenlty, running in the 120s (just walked in).  I am going to stop metoprolol tartrate and have him take Toprol XL 50 mg bid.  3. CKD: Will need to follow closely with diuresis.  Will be a delicate balance.  4. Bioprosthetic AVR: Probably mild prosthetic valvular stenosis based on most  recent echo.  5. CAD: s/p CABG. No chest pain.  He has not been on anticoagulation so he should take ASA 81 mg daily and the atorvastatin. 6. NASH with history of ascites: Abdomen is soft today and not particularly distended.  No indication for paracentesis.   [name] 10/09/2013

## 2013-10-08 NOTE — Patient Instructions (Signed)
Hold Metolazone for next 2 doses, then resume at 2.5 mg every Friday  Stop Metoprolol Tartrate  Start Metoprolol Succinate 50 mg Twice daily   Labs in 1 week  Your physician recommends that you schedule a follow-up appointment in: 2 weeks

## 2013-10-09 ENCOUNTER — Encounter (HOSPITAL_COMMUNITY): Payer: Medicare Other

## 2013-10-15 ENCOUNTER — Ambulatory Visit (INDEPENDENT_AMBULATORY_CARE_PROVIDER_SITE_OTHER): Payer: Medicare Other | Admitting: *Deleted

## 2013-10-15 DIAGNOSIS — I509 Heart failure, unspecified: Secondary | ICD-10-CM

## 2013-10-15 DIAGNOSIS — R0602 Shortness of breath: Secondary | ICD-10-CM | POA: Diagnosis not present

## 2013-10-15 DIAGNOSIS — I5032 Chronic diastolic (congestive) heart failure: Secondary | ICD-10-CM

## 2013-10-15 LAB — BASIC METABOLIC PANEL
BUN: 32 mg/dL — AB (ref 6–23)
CO2: 26 mEq/L (ref 19–32)
CREATININE: 1.6 mg/dL — AB (ref 0.4–1.5)
Calcium: 9 mg/dL (ref 8.4–10.5)
Chloride: 104 mEq/L (ref 96–112)
GFR: 45.94 mL/min — AB (ref >60.00)
Glucose, Bld: 210 mg/dL — ABNORMAL HIGH (ref 70–99)
POTASSIUM: 4 meq/L (ref 3.5–5.1)
Sodium: 139 mEq/L (ref 135–145)

## 2013-10-15 LAB — BRAIN NATRIURETIC PEPTIDE: Pro B Natriuretic peptide (BNP): 191 pg/mL — ABNORMAL HIGH (ref 0.0–100.0)

## 2013-10-16 ENCOUNTER — Other Ambulatory Visit: Payer: Self-pay | Admitting: Cardiovascular Disease

## 2013-10-22 ENCOUNTER — Encounter (HOSPITAL_COMMUNITY): Payer: Self-pay

## 2013-10-22 ENCOUNTER — Ambulatory Visit (HOSPITAL_COMMUNITY)
Admission: RE | Admit: 2013-10-22 | Discharge: 2013-10-22 | Disposition: A | Discharge status: Home / Self Care | Payer: Medicare Other | Source: Ambulatory Visit | Attending: Internal Medicine | Admitting: Internal Medicine

## 2013-10-22 VITALS — BP 131/86 | HR 122 | Resp 18 | Wt 284.2 lb

## 2013-10-22 DIAGNOSIS — I4891 Unspecified atrial fibrillation: Secondary | ICD-10-CM | POA: Insufficient documentation

## 2013-10-22 DIAGNOSIS — I509 Heart failure, unspecified: Secondary | ICD-10-CM

## 2013-10-22 DIAGNOSIS — I5032 Chronic diastolic (congestive) heart failure: Secondary | ICD-10-CM | POA: Insufficient documentation

## 2013-10-22 DIAGNOSIS — I25810 Atherosclerosis of coronary artery bypass graft(s) without angina pectoris: Secondary | ICD-10-CM | POA: Diagnosis not present

## 2013-10-22 MED ORDER — METOPROLOL SUCCINATE ER 25 MG PO TB24: 75.0000 mg | ORAL_TABLET | Freq: Two times a day (BID) | ORAL | Status: DC

## 2013-10-22 MED ORDER — POTASSIUM CHLORIDE CRYS ER 20 MEQ PO TBCR
40.0000 meq | EXTENDED_RELEASE_TABLET | Freq: Once | ORAL | Status: AC
  Administered 2013-10-22: 40 meq via ORAL
  Filled 2013-10-22: qty 2

## 2013-10-22 MED ORDER — METOPROLOL SUCCINATE ER 25 MG PO TB24: 75.0000 mg | ORAL_TABLET | Freq: Every day | ORAL | Status: DC

## 2013-10-22 MED ORDER — FUROSEMIDE 10 MG/ML IJ SOLN
80.0000 mg | Freq: Once | INTRAMUSCULAR | Status: AC
  Administered 2013-10-22: 80 mg via INTRAVENOUS
  Filled 2013-10-22: qty 8

## 2013-10-22 MED ORDER — METOPROLOL SUCCINATE ER 50 MG PO TB24: 75.0000 mg | ORAL_TABLET | Freq: Two times a day (BID) | ORAL | Status: DC

## 2013-10-22 NOTE — Progress Notes (Addendum)
Patient needs IV lasix, 22g PIV started in RPFA, 1 attempt, pt tolerated well.  Patient urinary output before IV lasix administered: 725cc  80 IV lasix and 40 meq PO potassium administered at 10:55 am.  Total Urinary output after IV lasix administered: 1075cc

## 2013-10-22 NOTE — Patient Instructions (Addendum)
Follow up in 2 weeks   Follow up next week with BMET   Take metolazone 2.5 mg every Friday   Take Toporol XL 75 mg twice a day  Do the following things EVERYDAY: 1) Weigh yourself in the morning before breakfast. Write it down and keep it in a log. 2) Take your medicines as prescribed 3) Eat low salt foods-Limit salt (sodium) to 2000 mg per day.  4) Stay as active as you can everyday 5) Limit all fluids for the day to less than 2 liters

## 2013-10-22 NOTE — Progress Notes (Signed)
Patient ID: [name], male   DOB: Apr 01, 1946, 68 y.o.   MRN: [id number] Primary Cardiologist: Dr. Burt Knack  68 yo with history of CAD s/p CABG, permanent atrial fibrillation, chronic diastolic CHF, severe AS s/p bioprosthetic AVR presents for evaluation of diastolic CHF.  Last LHC in 8/13 showed patent grafts.  AVR was in 10/13.  Last echo in 8/14 showed EF 55-60%.    He returns for follow up. Last visit he was switched to Toprol XL 50 mg twice a day and metolazone was held for 2 doses. Overall he is complaining of fatigue due to extra fluid. Increased SOB with exertion. + Orthopnea sleeps on 3-4 pillows.  Weight trending up from 269 to 281 pounds. Drinking > 2 liters of fluid per day and eating 2-3 oranges a day. Compliant with medications.   Labs (8/14): LDL 38 Labs (3/15): K 3.4, creatinine 1.7, BUN 55  Labs (10/15/13) K 4.0 Creatinine 1.6 BUN 32  PMH: 1. CAD: s/p CABG 2004. Last LHC (8/13) with patent free radial-OM, patent SVG-D, patent LIMA-LAD.  2. Permanent atrial fibrillation: Not anticoagulated due to GI bleeding.  3. Chronic diastolic CHF: Echo (8/93) with EF 55-60%, bioprosthetic aortic valve with mean gradient 22 mmHg, mildly decreased RV systolic function.   4. Severe aortic stenosis s/p bioprosthetic AVR in 10/13. Mean gradient 22 mmHg across valve on echo in 8/14.  5. NASH: ascites, thrombocytopenia.  Had paracentesis in 1/15. Gynecomastia with spironolactone.  6. 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   7. Hyperlipidemia 8. HTN 9. GERD 10. COPD 65. OSA: Cannot tolerate CPAP.  12. CKD  SH: Lives with wife in [place], prior smoker.   FH: CAD  ROS: All systems reviewed and negative except as per HPI.     BP 131/86  Pulse 122  Resp 18  Wt 284 lb 4 oz (128.935 kg)  SpO2 100% General: NAD Neck: JVP to jaw, no thyromegaly or thyroid nodule. Wife present  Lungs: Clear to auscultation bilaterally with normal respiratory effort. CV: Nondisplaced PMI.  Heart mildly  tachy, irregular S1/S2, no S3/S4, 2/6 early SEM RUSB.  No peripheral edema.  No carotid bruit.  Normal pedal pulses.  Abdomen: Soft, nontender, no hepatosplenomegaly, ++ distentiedon.  Skin: Intact without lesions or rashes.  Neurologic: Alert and oriented x 3.  Psych: Normal affect. Extremities: No clubbing or cyanosis.  HEENT: Normal.   EKG: 122 A fib RVR  Assessment/Plan: 1. Chronic diastolic CHF:  Volume status elevated in part because he is drinking > 2 liters of fluid per day. Weight up 11 pounds from  2 weeks ago. Give 80 mg IV lasix and 40 meq potassium in HF clinic. Continue torsemide 60 mg twice a day and add metolazone 2.5 mg once week.  Reinforced daily weights, low salt food choices, and limit fluid intake to < 2 liters per day.  Check  BMET next week.   2. Chronic atrial fibrillation: He is not anticoagulated due to history of GI bleeding from AVMS.  HR 120s today. Increase Toprol XL 75 mg bid.  3. CKD: Will need to follow closely with diuresis.  Check BMET next week.  4. Bioprosthetic AVR: Probably mild prosthetic valvular stenosis based on most recent echo.  5. CAD: s/p CABG. No chest pain.  He has not been on anticoagulation so he should take ASA 81 mg daily and the atorvastatin.  He reamined in HF clinic 1 1/2 hours to receive lasix. After receiving IV lasix he voided 1 liter  of urine. Check BMET next week.  Follow up in 2 weeks to reassess volume status.  Maysen Sudol 10/22/2013

## 2013-10-29 ENCOUNTER — Ambulatory Visit (INDEPENDENT_AMBULATORY_CARE_PROVIDER_SITE_OTHER): Payer: Medicare Other | Admitting: *Deleted

## 2013-10-29 DIAGNOSIS — I4891 Unspecified atrial fibrillation: Secondary | ICD-10-CM

## 2013-10-29 DIAGNOSIS — I5032 Chronic diastolic (congestive) heart failure: Secondary | ICD-10-CM

## 2013-10-29 LAB — BASIC METABOLIC PANEL
BUN: 45 mg/dL — AB (ref 6–23)
CALCIUM: 9.7 mg/dL (ref 8.4–10.5)
CO2: 32 meq/L (ref 19–32)
Chloride: 96 mEq/L (ref 96–112)
Creatinine, Ser: 1.6 mg/dL — ABNORMAL HIGH (ref 0.4–1.5)
GFR: 46.94 mL/min — ABNORMAL LOW (ref >60.00)
GLUCOSE: 126 mg/dL — AB (ref 70–99)
Potassium: 4 mEq/L (ref 3.5–5.1)
SODIUM: 137 meq/L (ref 135–145)

## 2013-11-01 DIAGNOSIS — J069 Acute upper respiratory infection, unspecified: Secondary | ICD-10-CM | POA: Diagnosis not present

## 2013-11-04 ENCOUNTER — Encounter: Payer: Self-pay | Admitting: Cardiovascular Disease

## 2013-11-05 ENCOUNTER — Telehealth (HOSPITAL_COMMUNITY): Payer: Self-pay

## 2013-11-05 ENCOUNTER — Encounter (HOSPITAL_COMMUNITY): Payer: Self-pay

## 2013-11-05 ENCOUNTER — Ambulatory Visit (HOSPITAL_COMMUNITY)
Admission: RE | Admit: 2013-11-05 | Discharge: 2013-11-05 | Disposition: A | Discharge status: Home / Self Care | Payer: Medicare Other | Source: Ambulatory Visit | Attending: Internal Medicine | Admitting: Internal Medicine

## 2013-11-05 VITALS — BP 140/80 | HR 121 | Resp 20 | Wt 280.4 lb

## 2013-11-05 DIAGNOSIS — I5032 Chronic diastolic (congestive) heart failure: Secondary | ICD-10-CM | POA: Insufficient documentation

## 2013-11-05 DIAGNOSIS — I509 Heart failure, unspecified: Secondary | ICD-10-CM | POA: Insufficient documentation

## 2013-11-05 DIAGNOSIS — N189 Chronic kidney disease, unspecified: Secondary | ICD-10-CM | POA: Diagnosis not present

## 2013-11-05 DIAGNOSIS — I4891 Unspecified atrial fibrillation: Secondary | ICD-10-CM | POA: Diagnosis not present

## 2013-11-05 DIAGNOSIS — N184 Chronic kidney disease, stage 4 (severe): Secondary | ICD-10-CM | POA: Insufficient documentation

## 2013-11-05 DIAGNOSIS — Z8719 Personal history of other diseases of the digestive system: Secondary | ICD-10-CM

## 2013-11-05 DIAGNOSIS — I25810 Atherosclerosis of coronary artery bypass graft(s) without angina pectoris: Secondary | ICD-10-CM | POA: Insufficient documentation

## 2013-11-05 LAB — BASIC METABOLIC PANEL
BUN: 40 mg/dL — AB (ref 6–23)
CHLORIDE: 97 meq/L (ref 96–112)
CO2: 27 mEq/L (ref 19–32)
CREATININE: 1.32 mg/dL (ref 0.50–1.35)
Calcium: 9.8 mg/dL (ref 8.4–10.5)
GFR calc Af Amer: 64 mL/min — ABNORMAL LOW (ref >90)
GFR calc non Af Amer: 55 mL/min — ABNORMAL LOW (ref >90)
Glucose, Bld: 185 mg/dL — ABNORMAL HIGH (ref 70–99)
Potassium: 4.2 mEq/L (ref 3.7–5.3)
Sodium: 140 mEq/L (ref 137–147)

## 2013-11-05 MED ORDER — METOPROLOL SUCCINATE ER 100 MG PO TB24: 100.0000 mg | ORAL_TABLET | Freq: Two times a day (BID) | ORAL | Status: DC

## 2013-11-05 MED ORDER — METOLAZONE 2.5 MG PO TABS: 2.5000 mg | ORAL_TABLET | ORAL | Status: DC

## 2013-11-05 NOTE — Progress Notes (Signed)
Patient ID: [name], male   DOB: 1945-09-19, 68 y.o.   MRN: [id number] Primary Cardiologist: Dr. Burt [name]  [name] is a 68 yo with history of CAD s/p CABG, permanent atrial fibrillation, chronic diastolic CHF, severe AS s/p bioprosthetic AVR presents for evaluation of diastolic CHF.  Last LHC in 8/13 showed patent grafts.  AVR was in 10/13.  Last echo in 8/14 showed EF 55-60%.    He returns for follow up with his wife.Last visit he was given 80 mg IV lasix and started on weekly metolazone. Toprol was increased to 75 mg twice a day due to Afib RVR. Remains SOB with exertion. + Orthopnea sleeps with HOB elevated. Weight at home 272-279 pounds. Still  drinking > 2 liters of fluid per day. Says his mouth gets dry. Compliant with medications.   Labs (8/14): LDL 38 Labs (3/15): K 3.4, creatinine 1.7, BUN 55  Labs (10/15/13) K 4.0 Creatinine 1.6 BUN 32 Labs (10/29/13) K 4.0 Creatinine 1.6  Labs (11/05/13) K 4.2 Creatinine 1.32   PMH: 1. CAD: s/p CABG 2004. Last LHC (8/13) with patent free radial-OM, patent SVG-D, patent LIMA-LAD.  2. Permanent atrial fibrillation: Not anticoagulated due to GI bleeding.  3. Chronic diastolic CHF: Echo (7/03) with EF 55-60%, bioprosthetic aortic valve with mean gradient 22 mmHg, mildly decreased RV systolic function.   4. Severe aortic stenosis s/p bioprosthetic AVR in 10/13. Mean gradient 22 mmHg across valve on echo in 8/14.  5. NASH: ascites, thrombocytopenia.  Had paracentesis in 1/15. Gynecomastia with spironolactone.  6. 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   7. Hyperlipidemia 8. HTN 9. GERD 10. COPD 71. OSA: Cannot tolerate CPAP.  12. CKD  SH: Lives with wife in [place], prior smoker.   FH: CAD  ROS: All systems reviewed and negative except as per HPI.   Current Outpatient Prescriptions on File Prior to Encounter  Medication Sig Dispense Refill  . Ascorbic Acid (VITAMIN C) 1000 MG tablet Take 1,000 mg by mouth 2 (two) times daily.      [name]  atorvastatin (LIPITOR) 20 MG tablet Take 20 mg by mouth at bedtime.       . ferrous gluconate (FERGON) 325 MG tablet Take 325 mg by mouth 2 (two) times daily.       [name] KLOR-CON M10 10 MEQ tablet TAKE 3 TABLETS BY MOUTH TWICE A DAY  180 tablet  1  . losartan (COZAAR) 100 MG tablet Take 1 tablet (100 mg total) by mouth daily.  90 tablet  3  . metFORMIN (GLUCOPHAGE) 1000 MG tablet Take 1,000 mg by mouth 2 (two) times daily with a meal.       . metolazone (ZAROXOLYN) 2.5 MG tablet Take 1 tablet (2.5 mg total) by mouth once a week. Take every Friday  30 tablet  3  . Multiple Vitamin (MULTIVITAMIN) tablet Take 1 tablet by mouth daily. spectravite      . omeprazole (PRILOSEC) 20 MG capsule Take 20 mg by mouth 2 (two) times daily.       [name] torsemide (DEMADEX) 20 MG tablet Take 3 tablets (60 mg total) by mouth 2 (two) times daily.  180 tablet  11   No current facility-administered medications on file prior to encounter.   BP 140/80  Pulse 121  Resp 20  Wt 280 lb 6 oz (127.177 kg)  SpO2 98% General: NAD Neck: JVP ~10, no thyromegaly or thyroid nodule. Wife present  Lungs: Clear to auscultation bilaterally with normal respiratory effort.  CV: Nondisplaced PMI.  Tachy, irregular S1/S2, no S3/S4, 2/6 early SEM RUSB.  No peripheral edema.  No carotid bruit.  Normal pedal pulses.  Abdomen: Soft, nontender, no hepatosplenomegaly, + distened.  Skin: Intact without lesions or rashes.  Neurologic: Alert and oriented x 3.  Psych: Normal affect. Extremities: No clubbing or cyanosis.  HEENT: Normal.     Assessment/Plan: 1. Chronic diastolic CHF: ECHO EF 9/50 EF 55-60%.  NYHA III. Volume status a little better but still elevated in part because he continues to drink > 2 liters of fluid per day. Weight is down 4 pounds from last visit. Continue torsemide 60 mg twice a day and instructed to take 2.5 mg metolazone for the next 2 days and then take Metolazone 2.5 mg every Monday and Friday.   Reinforced daily  weights, low salt food choices, and limit fluid intake to < 2 liters per day.  Check  BMET today ---> K 4.2 Creatinine 1.32  2. Chronic atrial fibrillation: He is not anticoagulated (was on coumadin in past) due to history of GI bleeding from AVMs.  HR 120s again despite increasing Toprol XL at last visit to 75 mg twice a day.  Increase Toprol XL to 100 mg mg bid. Need better rate control which would help with volume overload. Unable to cardiovert without anticoagulation.  Unable to add cardizem CD because he is allergic.   3. CKD: Will need to follow closely with diuresis.  Check BMET ---> Creatinine 1.32 4. Bioprosthetic AVR: Per Dr [name] probably mild prosthetic valvular stenosis based on most recent echo.  5. CAD: s/p CABG. No chest pain.    Follow up next week with Dr [name].  Matthew D Clegg NP-C  11/05/2013

## 2013-11-05 NOTE — Telephone Encounter (Signed)
Patient made aware of lab results, asked to f/u with PCP regarding elevated glucose.  Has appointment in next few weeks, will advise at that time. Renee Pain

## 2013-11-05 NOTE — Patient Instructions (Signed)
[place] up next week   Take 2.5 mg Metolazone today and tomorrow then take Metolazone every Monday and Friday    Take Toprol XL 100 mg twice a day  Do the following things EVERYDAY: 1) Weigh yourself in the morning before breakfast. Write it down and keep it in a log. 2) Take your medicines as prescribed 3) Eat low salt foods-Limit salt (sodium) to 2000 mg per day.  4) Stay as active as you can everyday 5) Limit all fluids for the day to less than 2 liters

## 2013-11-11 ENCOUNTER — Encounter (INDEPENDENT_AMBULATORY_CARE_PROVIDER_SITE_OTHER): Payer: Self-pay | Admitting: Internal Medicine

## 2013-11-11 ENCOUNTER — Ambulatory Visit (INDEPENDENT_AMBULATORY_CARE_PROVIDER_SITE_OTHER): Payer: Medicare Other | Admitting: Internal Medicine

## 2013-11-11 VITALS — BP 122/70 | HR 72 | Temp 97.2°F | Resp 16 | Ht 68.0 in | Wt 267.9 lb

## 2013-11-11 DIAGNOSIS — K7460 Unspecified cirrhosis of liver: Secondary | ICD-10-CM | POA: Diagnosis not present

## 2013-11-11 DIAGNOSIS — Z87898 Personal history of other specified conditions: Secondary | ICD-10-CM | POA: Diagnosis not present

## 2013-11-11 DIAGNOSIS — I25810 Atherosclerosis of coronary artery bypass graft(s) without angina pectoris: Secondary | ICD-10-CM | POA: Diagnosis not present

## 2013-11-11 DIAGNOSIS — D509 Iron deficiency anemia, unspecified: Secondary | ICD-10-CM | POA: Diagnosis not present

## 2013-11-11 NOTE — Progress Notes (Signed)
Presenting complaint;  Followup for anemia, cirrhosis and ascites.  Subjective:  Patient is 68 year old Caucasian male with history of cirrhosis secondary to NAFLD and history of ascites who presents for scheduled visit. He was last seen on 08/06/2013. He has kept weight dairy. On 10/24/2013 his weight was 280 pounds and today is down to 267.9 pounds. He is being treated for diastolic CHF. He denies change in abdominal distention. He does not feel that the fluid is back in his abdomen. Last abdominal tap was on 08/02/2013 when he was hospitalized and only 1500 mL could be removed. He states he is not able to do much walking or exercise. He denies melena or rectal bleeding. His appetite is normal. He has a couple of more days of Amoxil that he is taking for bronchitis.  Current Medications: Outpatient Encounter Prescriptions as of 11/11/2013  Medication Sig  . amoxicillin (AMOXIL) 500 MG capsule Take 500 mg by mouth 3 (three) times daily. For 10 days  . Ascorbic Acid (VITAMIN C) 1000 MG tablet Take 1,000 mg by mouth 2 (two) times daily.  [name] atorvastatin (LIPITOR) 20 MG tablet Take 20 mg by mouth at bedtime.   . ferrous gluconate (FERGON) 325 MG tablet Take 325 mg by mouth 2 (two) times daily.   . insulin NPH Human (HUMULIN N,NOVOLIN N) 100 UNIT/ML injection Inject 20-25 Units into the skin 2 (two) times daily before a meal.  . insulin regular (NOVOLIN R,HUMULIN R) 100 units/mL injection Inject 15 Units into the skin 2 (two) times daily before a meal.   . KLOR-CON M10 10 MEQ tablet TAKE 3 TABLETS BY MOUTH TWICE A DAY  . losartan (COZAAR) 100 MG tablet Take 1 tablet (100 mg total) by mouth daily.  . metFORMIN (GLUCOPHAGE) 1000 MG tablet Take 1,000 mg by mouth 2 (two) times daily with a meal.   . metolazone (ZAROXOLYN) 2.5 MG tablet Take 1 tablet (2.5 mg total) by mouth 2 (two) times a week. Take every Monday and Friday and as needed  . metoprolol succinate (TOPROL-XL) 100 MG 24 hr tablet Take 1  tablet (100 mg total) by mouth 2 (two) times daily. Take with or immediately following a meal.  . Multiple Vitamins-Minerals ([employer] SPECTRAVITE ADULT 50+ PO) Take 1 tablet by mouth daily.  [name] omeprazole (PRILOSEC) 20 MG capsule Take 20 mg by mouth 2 (two) times daily.   [name] torsemide (DEMADEX) 20 MG tablet Take 3 tablets (60 mg total) by mouth 2 (two) times daily.  . [DISCONTINUED] Multiple Vitamin (MULTIVITAMIN) tablet Take 1 tablet by mouth daily. spectravite     Objective: Blood pressure 122/70, pulse 72, temperature 97.2 F (36.2 C), temperature source Oral, resp. rate 16, height 5\' 8"  (1.727 m), weight 267 lb 14.4 oz (121.519 kg). Patient is alert and in no acute distress. Conjunctiva is pink. Sclera is nonicteric Oropharyngeal mucosa is normal. No neck masses or thyromegaly noted. Cardiac exam with irregular rhythm normal S1 and S2. Grade 2/6 systolic ejection murmur noted at LLSB. Lungs are clear to auscultation. Abdomen abdomen is protuberant. In palpation it is soft and nontender and shifting dullness is absent. Liver spleen could not be palpated  No LE edema or clubbing noted.  Labs/studies Results: H&H on 09/23/2013 was 9.8 and 32.3. H&H. on 08/01/2013 was 9.1 and 30.0   Assessment:  #1. Cirrhosis secondary to NAFLD.He appears to have preserved hepatic function but portal pressure may be elevated contributing to ascites which is predominantly secondary to diastolic CHF. Abdominal exam is  negative for ascites. #2. IDA. Hemoglobin is coming up slowly with oral iron which he may not be absorbing due to PPI. Anemia may be contributing to fluid retension. #3. Obesity. To improve his prognosis as far as liver disease is concerned he needs to lose weight.    Plan:  CBC. Serum iron, TIBC and serum ferritin with next blood draw. Call for abdominal distension in which case will do [place]. Office visit in 4 months.

## 2013-11-11 NOTE — Patient Instructions (Signed)
Drop evening dose of Prilosec or omeprazole. Physician will call with results of blood work. Notify if abdominal distention gets worse.

## 2013-11-14 ENCOUNTER — Ambulatory Visit (HOSPITAL_COMMUNITY)
Admission: RE | Admit: 2013-11-14 | Discharge: 2013-11-14 | Disposition: A | Discharge status: Home / Self Care | Payer: Medicare Other | Source: Ambulatory Visit | Attending: Internal Medicine | Admitting: Internal Medicine

## 2013-11-14 ENCOUNTER — Encounter (HOSPITAL_COMMUNITY): Payer: Self-pay

## 2013-11-14 VITALS — BP 112/68 | HR 129 | Wt 270.8 lb

## 2013-11-14 DIAGNOSIS — I4891 Unspecified atrial fibrillation: Secondary | ICD-10-CM | POA: Diagnosis not present

## 2013-11-14 DIAGNOSIS — I5032 Chronic diastolic (congestive) heart failure: Secondary | ICD-10-CM | POA: Diagnosis not present

## 2013-11-14 DIAGNOSIS — I509 Heart failure, unspecified: Secondary | ICD-10-CM | POA: Diagnosis not present

## 2013-11-14 DIAGNOSIS — K922 Gastrointestinal hemorrhage, unspecified: Secondary | ICD-10-CM

## 2013-11-14 DIAGNOSIS — I4821 Permanent atrial fibrillation: Secondary | ICD-10-CM

## 2013-11-14 LAB — BASIC METABOLIC PANEL
BUN: 67 mg/dL — AB (ref 6–23)
CO2: 25 mEq/L (ref 19–32)
CREATININE: 1.52 mg/dL — AB (ref 0.50–1.35)
Calcium: 9.9 mg/dL (ref 8.4–10.5)
Chloride: 97 mEq/L (ref 96–112)
GFR calc non Af Amer: 46 mL/min — ABNORMAL LOW (ref >90)
GFR, EST AFRICAN AMERICAN: 54 mL/min — AB (ref >90)
Glucose, Bld: 147 mg/dL — ABNORMAL HIGH (ref 70–99)
POTASSIUM: 3.6 meq/L — AB (ref 3.7–5.3)
Sodium: 140 mEq/L (ref 137–147)

## 2013-11-14 LAB — IRON AND TIBC
Iron: 54 ug/dL (ref 42–135)
Saturation Ratios: 10 % — ABNORMAL LOW (ref 20–55)
TIBC: 527 ug/dL — ABNORMAL HIGH (ref 215–435)
UIBC: 473 ug/dL — ABNORMAL HIGH (ref 125–400)

## 2013-11-14 LAB — CBC
HEMATOCRIT: 35.2 % — AB (ref 39.0–52.0)
Hemoglobin: 11.6 g/dL — ABNORMAL LOW (ref 13.0–17.0)
MCH: 27 pg (ref 26.0–34.0)
MCHC: 33 g/dL (ref 30.0–36.0)
MCV: 82.1 fL (ref 78.0–100.0)
Platelets: 125 10*3/uL — ABNORMAL LOW (ref 150–400)
RBC: 4.29 MIL/uL (ref 4.22–5.81)
RDW: 16.4 % — AB (ref 11.5–15.5)
WBC: 6.9 10*3/uL (ref 4.0–10.5)

## 2013-11-14 NOTE — Patient Instructions (Signed)
Follow up in 4 weeks  Do the following things EVERYDAY: 1) Weigh yourself in the morning before breakfast. Write it down and keep it in a log. 2) Take your medicines as prescribed 3) Eat low salt foods-Limit salt (sodium) to 2000 mg per day.  4) Stay as active as you can everyday 5) Limit all fluids for the day to less than 2 liters 

## 2013-11-14 NOTE — Progress Notes (Signed)
Patient ID: [name], male   DOB: 1945/10/08, 68 y.o.   MRN: [id number] Primary Cardiologist: Dr. Burt Knack GI: Dr Laural Golden- followed for cirrohisis  Mr Delbuono is a 68 yo with history of CAD s/p CABG, permanent atrial fibrillation, chronic diastolic CHF, severe AS s/p bioprosthetic AVR presents for evaluation of diastolic CHF.  Last LHC in 8/13 showed patent grafts.  AVR was in 10/13.  Last echo in 8/14 showed EF 55-60%.    He returns for follow up with his wife.Last visit he was instructed to take metolazone for 2 days and increase Toprol XL to 100 mg twice a day. Breathing much better. Denies PND/Orthopnea. Able to walk up 6 steps into his house without difficulty. Able to walk 1/4 mile slowly.  Weight at home trending down from 277 to 266 pounds.  Still  drinking > 2 liters of fluid per day.  Compliant with medications.    EKG   11/14/13 A fib RVR 124  Labs (8/14): LDL 38 Labs (3/15): K 3.4, creatinine 1.7, BUN 55  Labs (10/15/13) K 4.0 Creatinine 1.6 BUN 32 Labs (10/29/13) K 4.0 Creatinine 1.6  Labs (11/05/13) K 4.2 Creatinine 1.32   PMH: 1. CAD: s/p CABG 2004. Last LHC (8/13) with patent free radial-OM, patent SVG-D, patent LIMA-LAD.  2. Permanent atrial fibrillation: Not anticoagulated due to GI bleeding.  3. Chronic diastolic CHF: Echo (1/54) with EF 55-60%, bioprosthetic aortic valve with mean gradient 22 mmHg, mildly decreased RV systolic function.   4. Severe aortic stenosis s/p bioprosthetic AVR in 10/13. Mean gradient 22 mmHg across valve on echo in 8/14.  5. NASH: ascites, thrombocytopenia.  Had paracentesis in 1/15. Gynecomastia with spironolactone.  6. 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   7. Hyperlipidemia 8. HTN 9. GERD 10. COPD 64. OSA: Cannot tolerate CPAP.  12. CKD  SH: Lives with wife in [place], prior smoker.   FH: CAD  ROS: All systems reviewed and negative except as per HPI.   Current Outpatient Prescriptions on File Prior to Encounter  Medication Sig  Dispense Refill  . amoxicillin (AMOXIL) 500 MG capsule Take 500 mg by mouth 3 (three) times daily. For 10 days      . Ascorbic Acid (VITAMIN C) 1000 MG tablet Take 1,000 mg by mouth 2 (two) times daily.      [name] atorvastatin (LIPITOR) 20 MG tablet Take 20 mg by mouth at bedtime.       . ferrous gluconate (FERGON) 325 MG tablet Take 325 mg by mouth 2 (two) times daily.       . insulin NPH Human (HUMULIN N,NOVOLIN N) 100 UNIT/ML injection Inject 20-25 Units into the skin 2 (two) times daily before a meal.      . insulin regular (NOVOLIN R,HUMULIN R) 100 units/mL injection Inject 15 Units into the skin 2 (two) times daily before a meal.       . KLOR-CON M10 10 MEQ tablet TAKE 3 TABLETS BY MOUTH TWICE A DAY  180 tablet  1  . losartan (COZAAR) 100 MG tablet Take 1 tablet (100 mg total) by mouth daily.  90 tablet  3  . metFORMIN (GLUCOPHAGE) 1000 MG tablet Take 1,000 mg by mouth 2 (two) times daily with a meal.       . metolazone (ZAROXOLYN) 2.5 MG tablet Take 1 tablet (2.5 mg total) by mouth 2 (two) times a week. Take every Monday and Friday and as needed  30 tablet  6  . metoprolol succinate (TOPROL-XL)  100 MG 24 hr tablet Take 1 tablet (100 mg total) by mouth 2 (two) times daily. Take with or immediately following a meal.  60 tablet  6  . Multiple Vitamins-Minerals ([employer]S SPECTRAVITE ADULT 50+ PO) Take 1 tablet by mouth daily.      [name] omeprazole (PRILOSEC) 20 MG capsule Take 20 mg by mouth 2 (two) times daily.       [name] torsemide (DEMADEX) 20 MG tablet Take 3 tablets (60 mg total) by mouth 2 (two) times daily.  180 tablet  11   No current facility-administered medications on file prior to encounter.   BP 112/68  Pulse 129  Wt 270 lb 12.8 oz (122.834 kg)  SpO2 95% General: NAD Neck: JVP ~10, no thyromegaly or thyroid nodule. Wife present  Lungs: Clear to auscultation bilaterally with normal respiratory effort. CV: Nondisplaced PMI.  Tachy, irregular S1/S2, no S3/S4, 2/6 early SEM RUSB.  No peripheral  edema.  No carotid bruit.  Normal pedal pulses.  Abdomen: Soft, nontender, no hepatosplenomegaly, + distened.  Skin: Intact without lesions or rashes.  Neurologic: Alert and oriented x 3.  Psych: Normal affect. Extremities: No clubbing or cyanosis.  HEENT: Normal.     Assessment/Plan: 1. Chronic diastolic CHF: ECHO EF 4/78 EF 55-60%.  NYHA II-III. Volume status now appears stable. He continues to drink > 2 liters.  Weight is down 10 pounds from last visit. Continue torsemide 60 mg twice a day and take Metolazone 2.5 mg every Monday and Friday.   Reinforced daily weights, low salt food choices, and limit fluid intake to < 2 liters per day.  Check  BMET today.  2. Chronic atrial fibrillation: He is not anticoagulated (was on coumadin in past) due to history of GI bleeding from AVMs.  Discussed possible addition of Eliquis. Will watch for 3 months if no bleeding will consider. HR 120s again despite increasing Toprol XL at last visit to Toprol XL to 100 mg mg bid. Need better rate control which would help with volume overload.   Unable to add cardizem CD because he is allergic.    Today will place 48 hour holter monitor. If HR remains uncontrolled will restart verapamil. In December 2014 he was on verapamil 80 mg tid. This was stopped in January 2015 due to bradycardia.  3. CKD: Will need to follow closely with diuresis.  Check BMET --->  4. Bioprosthetic AVR: Per Dr Aundra Dubin probably mild prosthetic valvular stenosis based on most recent echo.  5. CAD: s/p CABG. No chest pain.  6. Cirrhosis: Per Dr Laural Golden 7. Anemia- Check serum iron,cbc, tibc, and serum ferritin and send results to Dr Laural Golden.  8. History of GIB-  Per Dr Laural Golden. Continue ferrous gluconate. Last admit January 2015. Last EGD 08/01/13 ---> No blood or bleeding, gastritis with punctate ulcers, mild portal gastropathy, no varices  Follow up in 4 weeks.  Amy D Clegg NP-C  2:07 PM  Patient seen and examined with Darrick Grinder, NP. We  discussed all aspects of the encounter. I agree with the assessment and plan as stated above.  Volume status much improved and he is feeling better. AF continues to be fast but this is asx. He was previously on verapamil but this was stopped in January during his GIB due to bradycardia. Will place 48 hour Holter monitor to assess ventricular rate, if elevated will resume verapamil. His CHADS2 score is 3 and we discussed risk and indications for anticoagulation with NOAC. 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 I reviewed EGD from 1/15 mild portal gastropathy with 2 small antral ulcers. No varices. We have chosen to wait another 3 months to make sure no further evidence of GIB. If hgb stable may consider low dose apixaban.   Total time spent 40 minutes. Over half that time spent discussing above.   Shaune Pascal Bensimhon,MD 4:18 PM

## 2013-11-15 ENCOUNTER — Telehealth (HOSPITAL_COMMUNITY): Payer: Self-pay | Admitting: Adult Health

## 2013-11-15 NOTE — Addendum Note (Signed)
Encounter addended by: Evalee Mutton, CCT on: 11/15/2013  9:42 AM<BR>     Documentation filed: Charges VN

## 2013-11-18 ENCOUNTER — Encounter: Payer: Self-pay | Admitting: *Deleted

## 2013-11-18 ENCOUNTER — Encounter (INDEPENDENT_AMBULATORY_CARE_PROVIDER_SITE_OTHER): Payer: Medicare Other

## 2013-11-18 DIAGNOSIS — I4891 Unspecified atrial fibrillation: Secondary | ICD-10-CM

## 2013-11-18 NOTE — Progress Notes (Signed)
Patient ID: [name], male   DOB: 06/02/1946, 68 y.o.   MRN: [id number] E-Cardio 48 hour holter monitor applied to patient.

## 2013-11-18 NOTE — Telephone Encounter (Signed)
Left message. Renal function stable. Asked to call back if he has questions.   Christyanna Mckeon D Azaylah Stailey NP-C  10:40 AM

## 2013-11-20 DIAGNOSIS — Z23 Encounter for immunization: Secondary | ICD-10-CM | POA: Diagnosis not present

## 2013-11-20 DIAGNOSIS — Z713 Dietary counseling and surveillance: Secondary | ICD-10-CM | POA: Diagnosis not present

## 2013-11-20 DIAGNOSIS — E669 Obesity, unspecified: Secondary | ICD-10-CM | POA: Diagnosis not present

## 2013-11-20 DIAGNOSIS — E7800 Pure hypercholesterolemia, unspecified: Secondary | ICD-10-CM | POA: Diagnosis not present

## 2013-11-20 DIAGNOSIS — I10 Essential (primary) hypertension: Secondary | ICD-10-CM | POA: Diagnosis not present

## 2013-11-20 DIAGNOSIS — IMO0001 Reserved for inherently not codable concepts without codable children: Secondary | ICD-10-CM | POA: Diagnosis not present

## 2013-11-20 DIAGNOSIS — Z6841 Body Mass Index (BMI) 40.0 and over, adult: Secondary | ICD-10-CM | POA: Diagnosis not present

## 2013-12-11 ENCOUNTER — Ambulatory Visit (HOSPITAL_COMMUNITY)
Admission: RE | Admit: 2013-12-11 | Discharge: 2013-12-11 | Disposition: A | Discharge status: Home / Self Care | Payer: Medicare Other | Source: Ambulatory Visit | Attending: Internal Medicine | Admitting: Internal Medicine

## 2013-12-11 ENCOUNTER — Encounter (HOSPITAL_COMMUNITY): Payer: Self-pay

## 2013-12-11 VITALS — BP 108/70 | HR 124 | Wt 273.4 lb

## 2013-12-11 DIAGNOSIS — I4891 Unspecified atrial fibrillation: Secondary | ICD-10-CM | POA: Diagnosis not present

## 2013-12-11 DIAGNOSIS — K2990 Gastroduodenitis, unspecified, without bleeding: Secondary | ICD-10-CM

## 2013-12-11 DIAGNOSIS — I25810 Atherosclerosis of coronary artery bypass graft(s) without angina pectoris: Secondary | ICD-10-CM | POA: Diagnosis not present

## 2013-12-11 DIAGNOSIS — Z954 Presence of other heart-valve replacement: Secondary | ICD-10-CM | POA: Insufficient documentation

## 2013-12-11 DIAGNOSIS — Z794 Long term (current) use of insulin: Secondary | ICD-10-CM | POA: Insufficient documentation

## 2013-12-11 DIAGNOSIS — I5032 Chronic diastolic (congestive) heart failure: Secondary | ICD-10-CM

## 2013-12-11 DIAGNOSIS — I2510 Atherosclerotic heart disease of native coronary artery without angina pectoris: Secondary | ICD-10-CM | POA: Insufficient documentation

## 2013-12-11 DIAGNOSIS — J449 Chronic obstructive pulmonary disease, unspecified: Secondary | ICD-10-CM | POA: Diagnosis not present

## 2013-12-11 DIAGNOSIS — Z87891 Personal history of nicotine dependence: Secondary | ICD-10-CM | POA: Insufficient documentation

## 2013-12-11 DIAGNOSIS — Z951 Presence of aortocoronary bypass graft: Secondary | ICD-10-CM | POA: Diagnosis not present

## 2013-12-11 DIAGNOSIS — I509 Heart failure, unspecified: Secondary | ICD-10-CM | POA: Diagnosis not present

## 2013-12-11 DIAGNOSIS — I350 Nonrheumatic aortic (valve) stenosis: Secondary | ICD-10-CM

## 2013-12-11 DIAGNOSIS — I359 Nonrheumatic aortic valve disorder, unspecified: Secondary | ICD-10-CM | POA: Diagnosis not present

## 2013-12-11 DIAGNOSIS — N189 Chronic kidney disease, unspecified: Secondary | ICD-10-CM

## 2013-12-11 DIAGNOSIS — D649 Anemia, unspecified: Secondary | ICD-10-CM | POA: Insufficient documentation

## 2013-12-11 DIAGNOSIS — I10 Essential (primary) hypertension: Secondary | ICD-10-CM | POA: Diagnosis not present

## 2013-12-11 DIAGNOSIS — J4489 Other specified chronic obstructive pulmonary disease: Secondary | ICD-10-CM | POA: Insufficient documentation

## 2013-12-11 DIAGNOSIS — E785 Hyperlipidemia, unspecified: Secondary | ICD-10-CM | POA: Diagnosis not present

## 2013-12-11 DIAGNOSIS — K219 Gastro-esophageal reflux disease without esophagitis: Secondary | ICD-10-CM | POA: Insufficient documentation

## 2013-12-11 DIAGNOSIS — K7460 Unspecified cirrhosis of liver: Secondary | ICD-10-CM | POA: Insufficient documentation

## 2013-12-11 DIAGNOSIS — I4821 Permanent atrial fibrillation: Secondary | ICD-10-CM

## 2013-12-11 DIAGNOSIS — G4733 Obstructive sleep apnea (adult) (pediatric): Secondary | ICD-10-CM | POA: Diagnosis not present

## 2013-12-11 DIAGNOSIS — I129 Hypertensive chronic kidney disease with stage 1 through stage 4 chronic kidney disease, or unspecified chronic kidney disease: Secondary | ICD-10-CM | POA: Diagnosis not present

## 2013-12-11 DIAGNOSIS — K2970 Gastritis, unspecified, without bleeding: Secondary | ICD-10-CM

## 2013-12-11 LAB — BASIC METABOLIC PANEL
BUN: 47 mg/dL — AB (ref 6–23)
CHLORIDE: 93 meq/L — AB (ref 96–112)
CO2: 25 meq/L (ref 19–32)
CREATININE: 1.56 mg/dL — AB (ref 0.50–1.35)
Calcium: 9.8 mg/dL (ref 8.4–10.5)
GFR calc Af Amer: 52 mL/min — ABNORMAL LOW (ref >90)
GFR calc non Af Amer: 45 mL/min — ABNORMAL LOW (ref >90)
Glucose, Bld: 197 mg/dL — ABNORMAL HIGH (ref 70–99)
Potassium: 3.6 mEq/L — ABNORMAL LOW (ref 3.7–5.3)
Sodium: 137 mEq/L (ref 137–147)

## 2013-12-11 LAB — CBC
HEMATOCRIT: 30.5 % — AB (ref 39.0–52.0)
Hemoglobin: 9.6 g/dL — ABNORMAL LOW (ref 13.0–17.0)
MCH: 26.9 pg (ref 26.0–34.0)
MCHC: 31.5 g/dL (ref 30.0–36.0)
MCV: 85.4 fL (ref 78.0–100.0)
Platelets: 130 10*3/uL — ABNORMAL LOW (ref 150–400)
RBC: 3.57 MIL/uL — ABNORMAL LOW (ref 4.22–5.81)
RDW: 17.7 % — AB (ref 11.5–15.5)
WBC: 7.7 10*3/uL (ref 4.0–10.5)

## 2013-12-11 LAB — PRO B NATRIURETIC PEPTIDE: Pro B Natriuretic peptide (BNP): 1152 pg/mL — ABNORMAL HIGH (ref 0–125)

## 2013-12-11 MED ORDER — TORSEMIDE 20 MG PO TABS: 80.0000 mg | ORAL_TABLET | Freq: Two times a day (BID) | ORAL | Status: DC

## 2013-12-11 MED ORDER — VERAPAMIL HCL ER 180 MG PO TBCR
180.0000 mg | EXTENDED_RELEASE_TABLET | Freq: Every day | ORAL | Status: DC
Start: 2013-12-11 — End: 2013-12-24

## 2013-12-11 MED ORDER — LOSARTAN POTASSIUM 50 MG PO TABS: 50.0000 mg | ORAL_TABLET | Freq: Every day | ORAL | Status: DC

## 2013-12-11 NOTE — Progress Notes (Signed)
Patient ID: [name], male   DOB: 07-05-1946, 68 y.o.   MRN: [id number] Primary Cardiologist: Dr. Burt Knack GI: Dr Laural Golden- followed for cirrohisis  [name] is a 68 yo with history of CAD s/p CABG, permanent atrial fibrillation, chronic diastolic CHF, severe AS s/p bioprosthetic AVR presents for evaluation of diastolic CHF.  Last LHC in 8/13 showed patent grafts.  AVR was in 10/13.  Last echo in 8/14 showed EF 55-60%.    At last visit he was doing better in terms of dyspnea, but seems to have taken a turn for the worse over the last week or so.  He is now dyspneic walking about 50 feet or taking garbage to the street.  He does feel better on metolazone days.  Weight is up 3 lbs.  His diet seems to be fairly high in sodium.  He is not getting regular exercise.  No bright red blood per rectum or melena, and hemoglobin seems to have stabilized.  HR continues to run high.  On holter in 4/15, average HR was 115 bpm.    EKG  11/14/13 A fib RVR 124  Labs (8/14): LDL 38 Labs (3/15): K 3.4, creatinine 1.7, BUN 55  Labs (10/15/13) K 4.0 Creatinine 1.6 BUN 32 Labs (10/29/13) K 4.0 Creatinine 1.6  Labs (4/15) K 4.2 Creatinine 1.32 => 1.5, HCT 35.2  PMH: 1. CAD: s/p CABG 2004. Last LHC (8/13) with patent free radial-OM, patent SVG-D, patent LIMA-LAD.  2. Permanent atrial fibrillation: Not anticoagulated due to GI bleeding.  Holter monitor (4/15) with mean HR 115 (afib).  3. Chronic diastolic CHF: Echo (9/21) with EF 55-60%, bioprosthetic aortic valve with mean gradient 22 mmHg, mildly decreased RV systolic function.   4. Severe aortic stenosis s/p bioprosthetic AVR in 10/13. Mean gradient 22 mmHg across valve on echo in 8/14.  5. NASH: ascites, thrombocytopenia.  Had paracentesis in 1/15. Gynecomastia with spironolactone.  6. 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  EGD in 1/15 showed mild portal gastropathy and 2 small antral ulcers.  7. Hyperlipidemia 8. HTN 9. GERD 10. COPD 59. OSA: Cannot tolerate CPAP.   12. CKD  SH: Lives with wife in [place], prior smoker.   FH: CAD  ROS: All systems reviewed and negative except as per HPI.   Current Outpatient Prescriptions on File Prior to Encounter  Medication Sig Dispense Refill  . Ascorbic Acid (VITAMIN C) 1000 MG tablet Take 1,000 mg by mouth 2 (two) times daily.      [name] atorvastatin (LIPITOR) 20 MG tablet Take 20 mg by mouth at bedtime.       . ferrous gluconate (FERGON) 325 MG tablet Take 325 mg by mouth 2 (two) times daily.       . insulin NPH Human (HUMULIN N,NOVOLIN N) 100 UNIT/ML injection Inject 20-25 Units into the skin 2 (two) times daily before a meal.      . insulin regular (NOVOLIN R,HUMULIN R) 100 units/mL injection Inject 15 Units into the skin 2 (two) times daily before a meal.       . KLOR-CON M10 10 MEQ tablet TAKE 3 TABLETS BY MOUTH TWICE A DAY  180 tablet  1  . losartan (COZAAR) 100 MG tablet Take 1 tablet (100 mg total) by mouth daily.  90 tablet  3  . metFORMIN (GLUCOPHAGE) 1000 MG tablet Take 1,000 mg by mouth 2 (two) times daily with a meal.       . metolazone (ZAROXOLYN) 2.5 MG tablet Take 1 tablet (  2.5 mg total) by mouth 2 (two) times a week. Take every Monday and Friday and as needed  30 tablet  6  . metoprolol succinate (TOPROL-XL) 100 MG 24 hr tablet Take 1 tablet (100 mg total) by mouth 2 (two) times daily. Take with or immediately following a meal.  60 tablet  6  . Multiple Vitamins-Minerals ([employer] SPECTRAVITE ADULT 50+ PO) Take 1 tablet by mouth daily.      [name] omeprazole (PRILOSEC) 20 MG capsule Take 20 mg by mouth 2 (two) times daily.       [name] torsemide (DEMADEX) 20 MG tablet Take 3 tablets (60 mg total) by mouth 2 (two) times daily.  180 tablet  11   No current facility-administered medications on file prior to encounter.   BP 108/70  Pulse 124  Wt 273 lb 6.4 oz (124.013 kg)  SpO2 95% General: NAD Neck: JVP 10-12, no thyromegaly or thyroid nodule. Wife present  Lungs: Clear to auscultation bilaterally with  normal respiratory effort. CV: Nondisplaced PMI.  Tachy, irregular S1/S2, no S3/S4, 2/6 early SEM RUSB.  No peripheral edema.  No carotid bruit.  Normal pedal pulses.  Abdomen: Soft, nontender, no hepatosplenomegaly, + distened.  Skin: Intact without lesions or rashes.  Neurologic: Alert and oriented x 3.  Psych: Normal affect. Extremities: No clubbing or cyanosis.   Assessment/Plan: 1. Chronic diastolic CHF: ECHO EF 4/01 EF 55-60%.  NYHA III symptoms.  Patient is volume overloaded today with increased exertional dyspnea.  He has not been watching his sodium intake.  - Increase torsemide to 80 mg bid.  Continue metolazone on Mondays and Fridays.  - BMET/BNP today and repeat BMET/BNP in 2 wks with office followup.   2. Chronic atrial fibrillation: He is not anticoagulated (was on coumadin in past) due to history of GI bleeding from AVMs.  Discussed possible addition of Eliquis. Hemoglobin is improved.  If no further bleeding, will consider starting Eliquis around 7/15. HR 120s again despite Toprol XL 100 mg bid.  Recent holter showed average HR 115.  He had an allergic reaction to diltiazem in the past but has tolerated verapamil.  I will have him start verapamil ER 180 mg daily.   3. CKD: Will need to follow closely with diuresis.  Following creatinine as above.  4. Bioprosthetic AVR: Probably mild prosthetic valvular stenosis based on most recent echo.  5. CAD: s/p CABG. No chest pain.  Continue atorvastatin.  He has been off aspirin and anticoagulation with history of GI bleeding.  See plan above to potentially start Eliquis if no further bleeding over the next couple of months.  6. Cirrhosis: Per Dr Laural Golden 7. Anemia: Hemoglobin improving.   8. History of GI bleed: Last admit January 2015. Last EGD 08/01/13 ---> No blood or bleeding, gastritis with punctate ulcers, mild portal gastropathy, no varices.  9. HTN: BP running on the low side now.  Since I am adding verapamil, I will decrease losartan  to 50 mg daily.   Followup 2 wks.  Matthew Showers McLean,MD 12/11/2013

## 2013-12-11 NOTE — Patient Instructions (Signed)
Decrease Losartan to 50 mg (1/2 tab) daily  Increase Torsemide to 80 mg (4 tabs) Twice daily   Start Verapamil ER 180 mg daily  Labs today  Your physician recommends that you schedule a follow-up appointment in: 2 weeks with labs

## 2013-12-12 ENCOUNTER — Encounter (HOSPITAL_COMMUNITY): Payer: Medicare Other

## 2013-12-16 ENCOUNTER — Other Ambulatory Visit: Payer: Self-pay | Admitting: Cardiovascular Disease

## 2013-12-24 ENCOUNTER — Ambulatory Visit (HOSPITAL_COMMUNITY)
Admission: RE | Admit: 2013-12-24 | Discharge: 2013-12-24 | Disposition: A | Discharge status: Home / Self Care | Payer: Medicare Other | Source: Ambulatory Visit | Attending: Internal Medicine | Admitting: Internal Medicine

## 2013-12-24 ENCOUNTER — Encounter (HOSPITAL_COMMUNITY): Payer: Self-pay

## 2013-12-24 VITALS — BP 116/62 | HR 112 | Wt 271.0 lb

## 2013-12-24 DIAGNOSIS — J4489 Other specified chronic obstructive pulmonary disease: Secondary | ICD-10-CM | POA: Insufficient documentation

## 2013-12-24 DIAGNOSIS — K219 Gastro-esophageal reflux disease without esophagitis: Secondary | ICD-10-CM | POA: Insufficient documentation

## 2013-12-24 DIAGNOSIS — J449 Chronic obstructive pulmonary disease, unspecified: Secondary | ICD-10-CM | POA: Diagnosis not present

## 2013-12-24 DIAGNOSIS — I5032 Chronic diastolic (congestive) heart failure: Secondary | ICD-10-CM | POA: Insufficient documentation

## 2013-12-24 DIAGNOSIS — Z951 Presence of aortocoronary bypass graft: Secondary | ICD-10-CM | POA: Insufficient documentation

## 2013-12-24 DIAGNOSIS — Z87891 Personal history of nicotine dependence: Secondary | ICD-10-CM | POA: Insufficient documentation

## 2013-12-24 DIAGNOSIS — I509 Heart failure, unspecified: Secondary | ICD-10-CM | POA: Diagnosis not present

## 2013-12-24 DIAGNOSIS — N189 Chronic kidney disease, unspecified: Secondary | ICD-10-CM | POA: Insufficient documentation

## 2013-12-24 DIAGNOSIS — I4891 Unspecified atrial fibrillation: Secondary | ICD-10-CM | POA: Insufficient documentation

## 2013-12-24 DIAGNOSIS — I129 Hypertensive chronic kidney disease with stage 1 through stage 4 chronic kidney disease, or unspecified chronic kidney disease: Secondary | ICD-10-CM | POA: Diagnosis not present

## 2013-12-24 DIAGNOSIS — I2510 Atherosclerotic heart disease of native coronary artery without angina pectoris: Secondary | ICD-10-CM | POA: Diagnosis not present

## 2013-12-24 LAB — CBC
HEMATOCRIT: 31.4 % — AB (ref 39.0–52.0)
Hemoglobin: 9.9 g/dL — ABNORMAL LOW (ref 13.0–17.0)
MCH: 26.6 pg (ref 26.0–34.0)
MCHC: 31.5 g/dL (ref 30.0–36.0)
MCV: 84.4 fL (ref 78.0–100.0)
PLATELETS: 125 10*3/uL — AB (ref 150–400)
RBC: 3.72 MIL/uL — ABNORMAL LOW (ref 4.22–5.81)
RDW: 16.5 % — ABNORMAL HIGH (ref 11.5–15.5)
WBC: 6.9 10*3/uL (ref 4.0–10.5)

## 2013-12-24 LAB — BASIC METABOLIC PANEL
BUN: 52 mg/dL — ABNORMAL HIGH (ref 6–23)
CALCIUM: 10.3 mg/dL (ref 8.4–10.5)
CO2: 27 meq/L (ref 19–32)
CREATININE: 1.41 mg/dL — AB (ref 0.50–1.35)
Chloride: 88 mEq/L — ABNORMAL LOW (ref 96–112)
GFR calc Af Amer: 59 mL/min — ABNORMAL LOW (ref >90)
GFR calc non Af Amer: 51 mL/min — ABNORMAL LOW (ref >90)
Glucose, Bld: 196 mg/dL — ABNORMAL HIGH (ref 70–99)
Potassium: 3.5 mEq/L — ABNORMAL LOW (ref 3.7–5.3)
Sodium: 133 mEq/L — ABNORMAL LOW (ref 137–147)

## 2013-12-24 LAB — IRON AND TIBC
Iron: 56 ug/dL (ref 42–135)
Saturation Ratios: 11 % — ABNORMAL LOW (ref 20–55)
TIBC: 532 ug/dL — ABNORMAL HIGH (ref 215–435)
UIBC: 476 ug/dL — AB (ref 125–400)

## 2013-12-24 LAB — FERRITIN: Ferritin: 21 ng/mL — ABNORMAL LOW (ref 22–322)

## 2013-12-24 LAB — APTT: aPTT: 31 seconds (ref 24–37)

## 2013-12-24 MED ORDER — ASPIRIN EC 325 MG PO TBEC: 325.0000 mg | DELAYED_RELEASE_TABLET | Freq: Every day | ORAL | Status: DC

## 2013-12-24 MED ORDER — VERAPAMIL HCL ER 240 MG PO TBCR: 240.0000 mg | EXTENDED_RELEASE_TABLET | Freq: Every day | ORAL | Status: DC

## 2013-12-24 NOTE — Progress Notes (Signed)
Patient ID: [name], male   DOB: 09/23/1945, 68 y.o.   MRN: [id number] Primary Cardiologist: Dr. Cooper GI: Dr Rehman- followed for cirrohisis Pulmonary: Dr Wert   Mr Warda is a 68 yo with history of CAD s/p CABG, permanent atrial fibrillation, chronic diastolic CHF, severe AS s/p bioprosthetic AVR presents for evaluation of diastolic CHF.  Last LHC in 8/13 showed patent grafts.  AVR was in 10/13.  Last echo in 8/14 showed EF 55-60%.    He returns for follow up. Last visit verapamil was started and torsemide was incrased to 80 mg twice a day. Weight really did not come down.  Complains of fatigue and dyspnea at rest.  Weight at home 267-272 pounds. Does not use CPAP. Tries to follow low salt diet. Drinking > 2 liters per day.     EKG  11/14/13 A fib RVR 124  Labs (8/14): LDL 38 Labs (3/15): K 3.4, creatinine 1.7, BUN 55  Labs (10/15/13) K 4.0 Creatinine 1.6 BUN 32 Labs (10/29/13) K 4.0 Creatinine 1.6  Labs (4/15) K 4.2 Creatinine 1.32 => 1.5, HCT 35.2 Labs (12/11/13) K 3.6 Creatinine 1.56 Pro BNP 1152 Hemoglobin 9.6   PMH: 1. CAD: s/p CABG 2004. Last LHC (8/13) with patent free radial-OM, patent SVG-D, patent LIMA-LAD.  2. Permanent atrial fibrillation: Not anticoagulated due to GI bleeding.  Holter monitor (4/15) with mean HR 115 (afib).  3. Chronic diastolic CHF: Echo (8/14) with EF 55-60%, bioprosthetic aortic valve with mean gradient 22 mmHg, mildly decreased RV systolic function.   4. Severe aortic stenosis s/p bioprosthetic AVR in 10/13. Mean gradient 22 mmHg across valve on echo in 8/14.  5. NASH: ascites, thrombocytopenia.  Had paracentesis in 1/15. Gynecomastia with spironolactone.  6. 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  EGD in 1/15 showed mild portal gastropathy and 2 small antral ulcers.  7. Hyperlipidemia 8. HTN 9. GERD 10. COPD 11. OSA: Cannot tolerate CPAP.  12. CKD  SH: Lives with wife in [place], prior smoker.   FH: CAD  ROS: All systems reviewed and negative  except as per HPI.   Current Outpatient Prescriptions on File Prior to Encounter  Medication Sig Dispense Refill  . Ascorbic Acid (VITAMIN C) 1000 MG tablet Take 1,000 mg by mouth 2 (two) times daily.      . atorvastatin (LIPITOR) 20 MG tablet Take 20 mg by mouth at bedtime.       . ferrous gluconate (FERGON) 325 MG tablet Take 325 mg by mouth 2 (two) times daily.       . insulin NPH Human (HUMULIN N,NOVOLIN N) 100 UNIT/ML injection Inject 20-25 Units into the skin 2 (two) times daily before a meal.      . insulin regular (NOVOLIN R,HUMULIN R) 100 units/mL injection Inject 15 Units into the skin 2 (two) times daily before a meal.       . KLOR-CON M10 10 MEQ tablet TAKE 3 TABLETS BY MOUTH TWICE A DAY  180 tablet  0  . losartan (COZAAR) 50 MG tablet Take 1 tablet (50 mg total) by mouth daily.  30 tablet  3  . metFORMIN (GLUCOPHAGE) 1000 MG tablet Take 1,000 mg by mouth 2 (two) times daily with a meal.       . metolazone (ZAROXOLYN) 2.5 MG tablet Take 1 tablet (2.5 mg total) by mouth 2 (two) times a week. Take every Monday and Friday and as needed  30 tablet  6  . metoprolol succinate (TOPROL-XL) 100 MG 24   hr tablet Take 1 tablet (100 mg total) by mouth 2 (two) times daily. Take with or immediately following a meal.  60 tablet  6  . Multiple Vitamins-Minerals ([employer] SPECTRAVITE ADULT 50+ PO) Take 1 tablet by mouth daily.      . omeprazole (PRILOSEC) 20 MG capsule Take 20 mg by mouth 2 (two) times daily.       . torsemide (DEMADEX) 20 MG tablet Take 4 tablets (80 mg total) by mouth 2 (two) times daily.  210 tablet  3  . verapamil (CALAN-SR) 180 MG CR tablet Take 1 tablet (180 mg total) by mouth daily.  30 tablet  3   No current facility-administered medications on file prior to encounter.   BP 116/62  Pulse 112  Wt 271 lb (122.925 kg)  SpO2 95% General: NAD Neck: JVP to jaw, no thyromegaly or thyroid nodule. Wife present  Lungs: Clear to auscultation bilaterally with normal respiratory  effort. CV: Nondisplaced PMI.  Tachy, irregular S1/S2, no S3/S4, 2/6 early SEM RUSB.  No peripheral edema.  No carotid bruit.  Normal pedal pulses.  Abdomen: Soft, nontender, no hepatosplenomegaly, + distened.  Skin: Intact without lesions or rashes.  Neurologic: Alert and oriented x 3.  Psych: Normal affect. Extremities: No clubbing or cyanosis.   Assessment/Plan: 1. Chronic diastolic CHF: ECHO EF 8/14 EF 55-60%.  NYHA III symptoms.  Persistent volume overload and increased dyspnea.   - Continue torsemide to 80 mg bid.  Continue metolazone on Mondays and Fridays Schedule RHC to sort out hemodynamics.  Check CBC, BMET, today.    2. Chronic atrial fibrillation:  (CHADS 3 - CHF, HTN, DM2) He is not anticoagulated (was on coumadin in past) due to history of GI bleeding from AVMs.  Discussed possible addition of Eliquis.  If no further bleeding, will consider starting Eliquis.  For now will add Enteric Aspirin 325 mg daily. HR 100s again despite Toprol XL 100 mg bid and Verapamil 180 mg daily. Will increase verapamil to 240 mg daily   3. CKD: Will need to follow closely with diuresis.  Following creatinine as above.  4. Bioprosthetic AVR: Probably mild prosthetic valvular stenosis based on most recent echo.  5. CAD: s/p CABG. No chest pain.  Continue atorvastatin.  He has been off aspirin and anticoagulation with history of GI bleeding.  See plan above to potentially start Eliquis if no further bleeding over the next couple of months.  6. Cirrhosis: Mild . Has follow in August Per Dr Rehman 7. Anemia: Hemoglobin down from 11.6 to 9.6. Check iron stores.    8. History of GI bleed: Last admit January 2015. Last EGD 08/01/13 ---> No blood or bleeding, gastritis with punctate ulcers, mild portal gastropathy, no varices.  9. HTN: Stable. Continue losartan to 50 mg daily.  10: OSA- does not use CPAP  Follow up 2 weeks post heart cath.   Amy D Clegg,NP-C  12/24/2013  Patient seen and examined with Amy  Clegg, NP. We discussed all aspects of the encounter. I agree with the assessment and plan as stated above.   Continues to struggle with volume overload and NYHA III symptoms. We discussed the role of RHC in further evalauting this and he would like to proceed. We also had a long talk about his anticoagulation. He has previously had GIB with coumadin and has been off for months. EGD showed gastritis with punctate ulcers. There were no varices and his cirrhosis is thought to be very mild. Thus I   think he would benefit from trying a NOAC - even though this would be off label in the setting of a bioprosthetic AoV. However his recent CBC shows a mild drop in HGB. We will start with ECASA 325 daily and repeat CBC and iron stores. If these are stable we will consider changing ASA to Eliquis 5 bid at next visit. Increase verapamil to 240mg for better rate control.    Shaindel Sweeten R Jasmon Mattice,MD 8:59 PM   

## 2013-12-24 NOTE — Patient Instructions (Addendum)
Stop verapamil 180 mg daily  Take verpamil 240 mg daily  Take enteric coated aspirin 325 mg daily   Call us to schedule follow up appointment (week after heart cath).  Do the following things EVERYDAY: 1) Weigh yourself in the morning before breakfast. Write it down and keep it in a log. 2) Take your medicines as prescribed 3) Eat low salt foods-Limit salt (sodium) to 2000 mg per day.  4) Stay as active as you can everyday 5) Limit all fluids for the day to less than 2 liters

## 2013-12-25 ENCOUNTER — Ambulatory Visit (INDEPENDENT_AMBULATORY_CARE_PROVIDER_SITE_OTHER): Payer: Medicare Other | Admitting: Cardiovascular Disease

## 2013-12-25 ENCOUNTER — Encounter: Payer: Self-pay | Admitting: Cardiovascular Disease

## 2013-12-25 VITALS — BP 120/72 | HR 138 | Ht 68.0 in | Wt 269.1 lb

## 2013-12-25 DIAGNOSIS — I25810 Atherosclerosis of coronary artery bypass graft(s) without angina pectoris: Secondary | ICD-10-CM

## 2013-12-25 DIAGNOSIS — I4891 Unspecified atrial fibrillation: Secondary | ICD-10-CM

## 2013-12-25 NOTE — Patient Instructions (Signed)
Your physician wants you to follow-up in: 6 MONTHS with Dr Cooper.  You will receive a reminder letter in the mail two months in advance. If you don't receive a letter, please call our office to schedule the follow-up appointment.  Your physician recommends that you continue on your current medications as directed. Please refer to the Current Medication list given to you today.  

## 2013-12-25 NOTE — Progress Notes (Signed)
HPI:  68 year old gentleman presenting for followup evaluation. The patient has an extensive cardiac history that includes coronary artery disease status post CABG, permanent atrial fibrillation, diastolic heart failure, and redo cardiac surgery with aortic valve replacement for treatment of severe aortic stenosis. He's also had significant gastrointestinal bleeding from arteriovenous malformations. 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  He continues to feel poorly and complain of shortness of breath and abdominal bloating. No chest pain or leg swelling. He denies PND but does have orthopnea. No lightheadedness or syncope.   Outpatient Encounter Prescriptions as of 12/25/2013  Medication Sig  . Ascorbic Acid (VITAMIN C) 1000 MG tablet Take 500 mg by mouth 2 (two) times daily.   [name] aspirin EC 325 MG tablet Take 1 tablet (325 mg total) by mouth daily.  [name] atorvastatin (LIPITOR) 20 MG tablet Take 20 mg by mouth at bedtime.   . ferrous gluconate (FERGON) 325 MG tablet Take 325 mg by mouth 2 (two) times daily.   . insulin NPH Human (HUMULIN N,NOVOLIN N) 100 UNIT/ML injection Inject 20-25 Units into the skin 2 (two) times daily before a meal.  . insulin regular (NOVOLIN R,HUMULIN R) 100 units/mL injection Inject 15 Units into the skin 2 (two) times daily before a meal.   . KLOR-CON M10 10 MEQ tablet TAKE 3 TABLETS BY MOUTH TWICE A DAY  . losartan (COZAAR) 50 MG tablet Take 1 tablet (50 mg total) by mouth daily.  . metFORMIN (GLUCOPHAGE) 1000 MG tablet Take 1,000 mg by mouth 2 (two) times daily with a meal.   . metolazone (ZAROXOLYN) 2.5 MG tablet Take 1 tablet (2.5 mg total) by mouth 2 (two) times a week. Take every Monday and Friday and as needed  . metoprolol succinate (TOPROL-XL) 100 MG 24  hr tablet Take 1 tablet (100 mg total) by mouth 2 (two) times daily. Take with or immediately following a meal.  . Multiple Vitamins-Minerals ([employer]S SPECTRAVITE ADULT 50+ PO) Take 1 tablet by mouth daily.  [name] omeprazole (PRILOSEC) 20 MG capsule Take 20 mg by mouth 2 (two) times daily.   [name] torsemide (DEMADEX) 20 MG tablet Take 4 tablets (80 mg total) by mouth 2 (two) times daily.  . verapamil (CALAN-SR) 240 MG CR tablet Take 1 tablet (240 mg total) by mouth at bedtime.  . [DISCONTINUED] verapamil (CALAN-SR) 180 MG CR tablet Take 1 tablet (180 mg total) by mouth daily.    Allergies  Allergen Reactions  . Codeine Other (See Comments)    Hurting in chest  . Diltiazem Hcl Itching  . Niacin Other (See Comments)    Hot flashes    Past Medical History  Diagnosis Date  . Overweight   . CAD (coronary artery disease)     a. s/p CABG 2004;  b. [place] 10/13:  LHC 8/13: Free radial to obtuse marginal patent, SVG-diagonal patent, LIMA-LAD patent, EF 65-70%, mean aortic valve gradient 42  . Atrial fibrillation     Permanent; off of Coumadin for now due to GI bleed  . DM2 (diabetes mellitus, type 2)   . Insomnia   . Carotid bruit 06/14/2011    a. pre-AVR dopplers 10/13: no sig ICA stenosis  . Hypertension   . Chronic diastolic heart failure   . GERD (gastroesophageal reflux disease)   .  COPD (chronic obstructive pulmonary disease)   . Iron deficiency anemia     Requiring intravenous iron  . H/O hiatal hernia   . AVM (arteriovenous malformation)     Recurrent GI bleeding requiring multiple transfusions  . Hyperlipidemia   . Thrombocytopenia   . Ascites     status post paracentesis with removal of 3.4 L of ascitic fluid  . Osteoarthritis   . Mediastinal adenopathy 09/22/2011  . Cirrhosis   . Iron deficiency anemia   . Mediastinal adenopathy 09/22/2011  . Aortic stenosis 03/08/2012    a.  s/p tissue AVR 10/13 with Dr. [name];   b. Echo 10/13: mod LVH, EF 55-60%, tissue AVR not well seen, no  leak, gradient not too high (mean 42mmHg), MAC, mild MR, mild LAE, PASP 38  . OSA (obstructive sleep apnea) 1999    DOES NOT USE CPAP   ROS: Negative except as per HPI  BP 120/72  Pulse 138  Ht 5\' 8"  (1.727 m)  Wt 122.072 kg (269 lb 1.9 oz)  BMI 40.93 kg/m2  PHYSICAL EXAM: Pt is alert and oriented, NAD HEENT: normal Neck: JVP - elevated to jaw, carotids 2+= without bruits Lungs: CTA bilaterally CV: tachy and irregular with 2/6 SEM at the RUSB Abd: soft, NT, obese, distended Ext: no C/C/E, distal pulses intact and equal Skin: warm/dry no rash  ASSESSMENT AND PLAN: 1. Acute on chronic diastolic CHF: on high-dose loop diuretic managed by CHF team, upcoming right heart cath.   2. Permanent AF, uncontrolled. On high doses of Toprol XL and verapamil which was just increased 6/2. Rate remains high. I think this is contributing significantly to his CHF. Considerations include amiodarone for rate-control, AF ablation (normal LA size), or AVN ablation/pacer as last resort. Will review with CHF team to get input.  3. CAD, native vessel. No angina.   4. HTN - stable.   Sherren Mocha 12/25/2013 2:04 PM

## 2013-12-27 ENCOUNTER — Ambulatory Visit: Payer: Medicare Other | Admitting: Cardiovascular Disease

## 2013-12-27 ENCOUNTER — Telehealth (HOSPITAL_COMMUNITY): Payer: Self-pay | Admitting: Cardiology

## 2013-12-27 NOTE — Telephone Encounter (Signed)
Pt scheduled for RHC on 01/01/2014 CPT- 93453 ICD9- 428.22 With pts current insurance no pre cert required

## 2013-12-31 ENCOUNTER — Encounter (HOSPITAL_COMMUNITY): Payer: Self-pay | Admitting: Pharmacy Technician

## 2014-01-01 ENCOUNTER — Encounter (HOSPITAL_COMMUNITY)
Admission: RE | Disposition: A | Discharge status: Home / Self Care | Payer: Self-pay | Source: Ambulatory Visit | Attending: Internal Medicine

## 2014-01-01 ENCOUNTER — Ambulatory Visit (HOSPITAL_COMMUNITY)
Admission: RE | Admit: 2014-01-01 | Discharge: 2014-01-01 | Disposition: A | Discharge status: Home / Self Care | Payer: Medicare Other | Source: Ambulatory Visit | Attending: Internal Medicine | Admitting: Internal Medicine

## 2014-01-01 DIAGNOSIS — G4733 Obstructive sleep apnea (adult) (pediatric): Secondary | ICD-10-CM | POA: Diagnosis not present

## 2014-01-01 DIAGNOSIS — K219 Gastro-esophageal reflux disease without esophagitis: Secondary | ICD-10-CM | POA: Diagnosis not present

## 2014-01-01 DIAGNOSIS — N189 Chronic kidney disease, unspecified: Secondary | ICD-10-CM | POA: Insufficient documentation

## 2014-01-01 DIAGNOSIS — I5032 Chronic diastolic (congestive) heart failure: Secondary | ICD-10-CM | POA: Insufficient documentation

## 2014-01-01 DIAGNOSIS — I509 Heart failure, unspecified: Secondary | ICD-10-CM | POA: Insufficient documentation

## 2014-01-01 DIAGNOSIS — I129 Hypertensive chronic kidney disease with stage 1 through stage 4 chronic kidney disease, or unspecified chronic kidney disease: Secondary | ICD-10-CM | POA: Insufficient documentation

## 2014-01-01 DIAGNOSIS — Z951 Presence of aortocoronary bypass graft: Secondary | ICD-10-CM | POA: Insufficient documentation

## 2014-01-01 DIAGNOSIS — I2510 Atherosclerotic heart disease of native coronary artery without angina pectoris: Secondary | ICD-10-CM | POA: Insufficient documentation

## 2014-01-01 DIAGNOSIS — I4891 Unspecified atrial fibrillation: Secondary | ICD-10-CM | POA: Insufficient documentation

## 2014-01-01 DIAGNOSIS — E785 Hyperlipidemia, unspecified: Secondary | ICD-10-CM | POA: Insufficient documentation

## 2014-01-01 DIAGNOSIS — Z87891 Personal history of nicotine dependence: Secondary | ICD-10-CM | POA: Diagnosis not present

## 2014-01-01 DIAGNOSIS — Z954 Presence of other heart-valve replacement: Secondary | ICD-10-CM | POA: Insufficient documentation

## 2014-01-01 DIAGNOSIS — D649 Anemia, unspecified: Secondary | ICD-10-CM | POA: Insufficient documentation

## 2014-01-01 HISTORY — PX: RIGHT HEART CATHETERIZATION: SHX5447

## 2014-01-01 LAB — POCT I-STAT 3, VENOUS BLOOD GAS (G3P V)
Acid-Base Excess: 5 mmol/L — ABNORMAL HIGH (ref 0.0–2.0)
Bicarbonate: 31 mEq/L — ABNORMAL HIGH (ref 20.0–24.0)
O2 SAT: 59 %
TCO2: 33 mmol/L (ref 0–100)
pCO2, Ven: 51.1 mmHg — ABNORMAL HIGH (ref 45.0–50.0)
pH, Ven: 7.391 — ABNORMAL HIGH (ref 7.250–7.300)
pO2, Ven: 32 mmHg (ref 30.0–45.0)

## 2014-01-01 LAB — PROTIME-INR
INR: 1.06 (ref 0.00–1.49)
PROTHROMBIN TIME: 13.6 s (ref 11.6–15.2)

## 2014-01-01 LAB — GLUCOSE, CAPILLARY: Glucose-Capillary: 197 mg/dL — ABNORMAL HIGH (ref 70–99)

## 2014-01-01 SURGERY — RIGHT HEART CATH
Anesthesia: LOCAL

## 2014-01-01 MED ORDER — ASPIRIN 81 MG PO CHEW
81.0000 mg | CHEWABLE_TABLET | ORAL | Status: AC
Start: 2014-01-01 — End: 2014-01-01
  Administered 2014-01-01: 81 mg via ORAL

## 2014-01-01 MED ORDER — SODIUM CHLORIDE 0.9 % IV SOLN: INTRAVENOUS | Status: DC

## 2014-01-01 MED ORDER — SODIUM CHLORIDE 0.9 % IV SOLN: 250.0000 mL | INTRAVENOUS | Status: DC | PRN

## 2014-01-01 MED ORDER — SODIUM CHLORIDE 0.9 % IJ SOLN: 3.0000 mL | INTRAMUSCULAR | Status: DC | PRN

## 2014-01-01 MED ORDER — ASPIRIN 81 MG PO CHEW
CHEWABLE_TABLET | ORAL | Status: AC
  Filled 2014-01-01: qty 1

## 2014-01-01 MED ORDER — MIDAZOLAM HCL 2 MG/2ML IJ SOLN
INTRAMUSCULAR | Status: AC
  Filled 2014-01-01: qty 2

## 2014-01-01 MED ORDER — SODIUM CHLORIDE 0.9 % IJ SOLN: 3.0000 mL | Freq: Two times a day (BID) | INTRAMUSCULAR | Status: DC

## 2014-01-01 MED ORDER — LIDOCAINE HCL (PF) 1 % IJ SOLN
INTRAMUSCULAR | Status: AC
  Filled 2014-01-01: qty 30

## 2014-01-01 MED ORDER — FENTANYL CITRATE 0.05 MG/ML IJ SOLN
INTRAMUSCULAR | Status: AC
  Filled 2014-01-01: qty 2

## 2014-01-01 NOTE — Discharge Instructions (Signed)
CALL DR BENSIMHON IF ANY PROBLEMS,QUESTIONS, OR CONCERNS; CALL DR BENSIMHON IF ANY BLEEDING,DRAINAGE,FEVER,PAIN,SWELLING,OR REDNESS AT SITE

## 2014-01-01 NOTE — Interval H&P Note (Signed)
History and Physical Interval Note:  01/01/2014 4:19 PM  [name]  has presented today for surgery, with the diagnosis of heart failure  The various methods of treatment have been discussed with the patient and family. After consideration of risks, benefits and other options for treatment, the patient has consented to  Procedure(s): RIGHT HEART CATH (N/A) as a surgical intervention .  The patient's history has been reviewed, patient examined, no change in status, stable for surgery.  I have reviewed the patient's chart and labs.  Questions were answered to the patient's satisfaction.     Riely Oetken

## 2014-01-01 NOTE — H&P (View-Only) (Signed)
Patient ID: [name], male   DOB: [birth year], 68 y.o.   MRN: [id number] Primary Cardiologist: Dr. Burt [name] GI: Dr [name]- followed for cirrohisis Pulmonary: Dr [name]   Mr [name] is a 68 yo with history of CAD s/p CABG, permanent atrial fibrillation, chronic diastolic CHF, severe AS s/p bioprosthetic AVR presents for evaluation of diastolic CHF.  Last LHC in 8/13 showed patent grafts.  AVR was in 10/13.  Last echo in 8/14 showed EF 55-60%.    He returns for follow up. Last visit verapamil was started and torsemide was incrased to 80 mg twice a day. Weight really did not come down.  Complains of fatigue and dyspnea at rest.  Weight at home 267-272 pounds. Does not use CPAP. Tries to follow low salt diet. Drinking > 2 liters per day.     EKG  11/14/13 A fib RVR 124  Labs (8/14): LDL 38 Labs (3/15): K 3.4, creatinine 1.7, BUN 55  Labs (10/15/13) K 4.0 Creatinine 1.6 BUN 32 Labs (10/29/13) K 4.0 Creatinine 1.6  Labs (4/15) K 4.2 Creatinine 1.32 => 1.5, HCT 35.2 Labs (12/11/13) K 3.6 Creatinine 1.56 Pro BNP 1152 Hemoglobin 9.6   PMH: 1. CAD: s/p CABG 2004. Last LHC (8/13) with patent free radial-OM, patent SVG-D, patent LIMA-LAD.  2. Permanent atrial fibrillation: Not anticoagulated due to GI bleeding.  Holter monitor (4/15) with mean HR 115 (afib).  3. Chronic diastolic CHF: Echo (4/13) with EF 55-60%, bioprosthetic aortic valve with mean gradient 22 mmHg, mildly decreased RV systolic function.   4. Severe aortic stenosis s/p bioprosthetic AVR in 10/13. Mean gradient 22 mmHg across valve on echo in 8/14.  5. NASH: ascites, thrombocytopenia.  Had paracentesis in 1/15. Gynecomastia with spironolactone.  6. 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  EGD in 1/15 showed mild portal gastropathy and 2 small antral ulcers.  7. Hyperlipidemia 8. HTN 9. GERD 10. COPD 22. OSA: Cannot tolerate CPAP.  12. CKD  SH: Lives with wife in [place], prior smoker.   FH: CAD  ROS: All systems reviewed and negative  except as per HPI.   Current Outpatient Prescriptions on File Prior to Encounter  Medication Sig Dispense Refill  . Ascorbic Acid (VITAMIN C) 1000 MG tablet Take 1,000 mg by mouth 2 (two) times daily.      [name] atorvastatin (LIPITOR) 20 MG tablet Take 20 mg by mouth at bedtime.       . ferrous gluconate (FERGON) 325 MG tablet Take 325 mg by mouth 2 (two) times daily.       . insulin NPH Human (HUMULIN N,NOVOLIN N) 100 UNIT/ML injection Inject 20-25 Units into the skin 2 (two) times daily before a meal.      . insulin regular (NOVOLIN R,HUMULIN R) 100 units/mL injection Inject 15 Units into the skin 2 (two) times daily before a meal.       . KLOR-CON M10 10 MEQ tablet TAKE 3 TABLETS BY MOUTH TWICE A DAY  180 tablet  0  . losartan (COZAAR) 50 MG tablet Take 1 tablet (50 mg total) by mouth daily.  30 tablet  3  . metFORMIN (GLUCOPHAGE) 1000 MG tablet Take 1,000 mg by mouth 2 (two) times daily with a meal.       . metolazone (ZAROXOLYN) 2.5 MG tablet Take 1 tablet (2.5 mg total) by mouth 2 (two) times a week. Take every Monday and Friday and as needed  30 tablet  6  . metoprolol succinate (TOPROL-XL) 100 MG 24  hr tablet Take 1 tablet (100 mg total) by mouth 2 (two) times daily. Take with or immediately following a meal.  60 tablet  6  . Multiple Vitamins-Minerals ([employer]S SPECTRAVITE ADULT 50+ PO) Take 1 tablet by mouth daily.      [name] omeprazole (PRILOSEC) 20 MG capsule Take 20 mg by mouth 2 (two) times daily.       [name] torsemide (DEMADEX) 20 MG tablet Take 4 tablets (80 mg total) by mouth 2 (two) times daily.  210 tablet  3  . verapamil (CALAN-SR) 180 MG CR tablet Take 1 tablet (180 mg total) by mouth daily.  30 tablet  3   No current facility-administered medications on file prior to encounter.   BP 116/62  Pulse 112  Wt 271 lb (122.925 kg)  SpO2 95% General: NAD Neck: JVP to jaw, no thyromegaly or thyroid nodule. Wife present  Lungs: Clear to auscultation bilaterally with normal respiratory  effort. CV: Nondisplaced PMI.  Tachy, irregular S1/S2, no S3/S4, 2/6 early SEM RUSB.  No peripheral edema.  No carotid bruit.  Normal pedal pulses.  Abdomen: Soft, nontender, no hepatosplenomegaly, + distened.  Skin: Intact without lesions or rashes.  Neurologic: Alert and oriented x 3.  Psych: Normal affect. Extremities: No clubbing or cyanosis.   Assessment/Plan: 1. Chronic diastolic CHF: ECHO EF 4/66 EF 55-60%.  NYHA III symptoms.  Persistent volume overload and increased dyspnea.   - Continue torsemide to 80 mg bid.  Continue metolazone on Mondays and Fridays Schedule RHC to sort out hemodynamics.  Check CBC, BMET, today.    2. Chronic atrial fibrillation:  (CHADS 3 - CHF, HTN, DM2) He is not anticoagulated (was on coumadin in past) due to history of GI bleeding from AVMs.  Discussed possible addition of Eliquis.  If no further bleeding, will consider starting Eliquis.  For now will add Enteric Aspirin 325 mg daily. HR 100s again despite Toprol XL 100 mg bid and Verapamil 180 mg daily. Will increase verapamil to 240 mg daily   3. CKD: Will need to follow closely with diuresis.  Following creatinine as above.  4. Bioprosthetic AVR: Probably mild prosthetic valvular stenosis based on most recent echo.  5. CAD: s/p CABG. No chest pain.  Continue atorvastatin.  He has been off aspirin and anticoagulation with history of GI bleeding.  See plan above to potentially start Eliquis if no further bleeding over the next couple of months.  6. Cirrhosis: Mild . Has follow in August Per Dr [name] 7. Anemia: Hemoglobin down from 11.6 to 9.6. Check iron stores.    8. History of GI bleed: Last admit January 2015. Last EGD 08/01/13 ---> No blood or bleeding, gastritis with punctate ulcers, mild portal gastropathy, no varices.  9. HTN: Stable. Continue losartan to 50 mg daily.  10: OSA- does not use CPAP  Follow up 2 weeks post heart cath.   Matthew D Clegg,NP-C  12/24/2013  Patient seen and examined with Matthew Grinder, NP. We discussed all aspects of the encounter. I agree with the assessment and plan as stated above.   Continues to struggle with volume overload and NYHA III symptoms. We discussed the role of RHC in further evalauting this and he would like to proceed. We also had a long talk about his anticoagulation. He has previously had GIB with coumadin and has been off for months. EGD showed gastritis with punctate ulcers. There were no varices and his cirrhosis is thought to be very mild. Thus I  think he would benefit from trying a NOAC - even though this would be off label in the setting of a bioprosthetic AoV. However his recent CBC shows a mild drop in HGB. We will start with ECASA 325 daily and repeat CBC and iron stores. If these are stable we will consider changing ASA to Eliquis 5 bid at next visit. Increase verapamil to 240mg  for better rate control.    Shaune Pascal Bensimhon,MD 8:59 PM

## 2014-01-01 NOTE — CV Procedure (Addendum)
Cardiac Cath Procedure Note:  Indication:  Heart failure  Procedures performed:  1) Right heart catheterization  Description of procedure:   The risks and indication of the procedure were explained. Consent was signed and placed on the chart. An appropriate timeout was taken prior to the procedure. The right neck was prepped and draped in the routine sterile fashion and anesthetized with 1% local lidocaine.   A 7 FR venous sheath was placed in the right internal jugular vein using a modified Seldinger technique. A standard Swan-Ganz catheter was used for the procedure.   Complications: None apparent.  Findings:  RA = 14 RV =  44/4/11 PA =  50/23 (33) PCW = 20 v = 35 Fick cardiac output/index = 6.0/2.6 PVR = 2.2 WU FA sat = 98% PA sat = 59%, 60%  Assessment: 1. Elevated biventricular pressures (R>L). Significant v waves in PCWP tracing suggestive of mitral regurgitation 2. Normal cardiac output  Plan/Discussion:  Will increase metolazone to 2.5 mg M/W/F. Continue demadex 80 bid. F/u Clinic 2 weeks.   Daniel Bensimhon 4:21 PM

## 2014-01-02 LAB — POCT I-STAT 3, VENOUS BLOOD GAS (G3P V)
ACID-BASE EXCESS: 4 mmol/L — AB (ref 0.0–2.0)
Bicarbonate: 29.3 mEq/L — ABNORMAL HIGH (ref 20.0–24.0)
O2 Saturation: 60 %
TCO2: 31 mmol/L (ref 0–100)
pCO2, Ven: 49.3 mmHg (ref 45.0–50.0)
pH, Ven: 7.381 — ABNORMAL HIGH (ref 7.250–7.300)
pO2, Ven: 32 mmHg (ref 30.0–45.0)

## 2014-01-08 ENCOUNTER — Telehealth (HOSPITAL_COMMUNITY): Payer: Self-pay | Admitting: Vascular Surgery

## 2014-01-08 DIAGNOSIS — I5032 Chronic diastolic (congestive) heart failure: Secondary | ICD-10-CM

## 2014-01-08 MED ORDER — TORSEMIDE 20 MG PO TABS: 100.0000 mg | ORAL_TABLET | Freq: Two times a day (BID) | ORAL | Status: DC

## 2014-01-08 NOTE — Addendum Note (Signed)
Addended by: Kerry Dory on: 01/08/2014 04:22 PM   Modules accepted: Orders

## 2014-01-08 NOTE — Telephone Encounter (Signed)
Pt had heart cath 01/01/14 pt SOB weight gain since 01/08/14 5LBS

## 2014-01-08 NOTE — Telephone Encounter (Addendum)
Spoke with pt regarding SOB and weight gain Pt states he does has LE edema Increased SOB, weight gain x 5 lbs weight today 270 lbs. However pt states he increased fluid intake two days ago as he was in the heat for a few hours.   Per vo Dr. Haroldine Laws Continue Metolazone M,W,F Increase Torsemide 100mg  BID Increase Potassium to 41meq BID Return for labs this week, follow up in clinic next week  Pt aware and voiced understanding

## 2014-01-10 ENCOUNTER — Encounter (HOSPITAL_COMMUNITY): Payer: Self-pay | Admitting: Emergency Medicine

## 2014-01-10 ENCOUNTER — Ambulatory Visit (HOSPITAL_BASED_OUTPATIENT_CLINIC_OR_DEPARTMENT_OTHER)
Admission: RE | Admit: 2014-01-10 | Discharge: 2014-01-10 | Disposition: A | Discharge status: Home / Self Care | Payer: Medicare Other | Source: Ambulatory Visit | Attending: Internal Medicine | Admitting: Internal Medicine

## 2014-01-10 ENCOUNTER — Inpatient Hospital Stay (HOSPITAL_COMMUNITY)
Admission: EM | Admit: 2014-01-10 | Discharge: 2014-01-14 | DRG: 377 | Disposition: A | Discharge status: Home / Self Care | Payer: Medicare Other | Attending: Internal Medicine | Admitting: Internal Medicine

## 2014-01-10 DIAGNOSIS — K766 Portal hypertension: Secondary | ICD-10-CM | POA: Diagnosis present

## 2014-01-10 DIAGNOSIS — K7689 Other specified diseases of liver: Secondary | ICD-10-CM | POA: Diagnosis present

## 2014-01-10 DIAGNOSIS — K5520 Angiodysplasia of colon without hemorrhage: Secondary | ICD-10-CM | POA: Diagnosis present

## 2014-01-10 DIAGNOSIS — K2960 Other gastritis without bleeding: Secondary | ICD-10-CM | POA: Diagnosis present

## 2014-01-10 DIAGNOSIS — I509 Heart failure, unspecified: Secondary | ICD-10-CM

## 2014-01-10 DIAGNOSIS — I25810 Atherosclerosis of coronary artery bypass graft(s) without angina pectoris: Secondary | ICD-10-CM

## 2014-01-10 DIAGNOSIS — N183 Chronic kidney disease, stage 3 unspecified: Secondary | ICD-10-CM | POA: Diagnosis present

## 2014-01-10 DIAGNOSIS — M1990 Unspecified osteoarthritis, unspecified site: Secondary | ICD-10-CM | POA: Diagnosis present

## 2014-01-10 DIAGNOSIS — I359 Nonrheumatic aortic valve disorder, unspecified: Secondary | ICD-10-CM | POA: Diagnosis not present

## 2014-01-10 DIAGNOSIS — G4733 Obstructive sleep apnea (adult) (pediatric): Secondary | ICD-10-CM | POA: Diagnosis present

## 2014-01-10 DIAGNOSIS — D62 Acute posthemorrhagic anemia: Secondary | ICD-10-CM | POA: Diagnosis present

## 2014-01-10 DIAGNOSIS — I4821 Permanent atrial fibrillation: Secondary | ICD-10-CM | POA: Diagnosis present

## 2014-01-10 DIAGNOSIS — D649 Anemia, unspecified: Secondary | ICD-10-CM | POA: Diagnosis not present

## 2014-01-10 DIAGNOSIS — Q2733 Arteriovenous malformation of digestive system vessel: Secondary | ICD-10-CM

## 2014-01-10 DIAGNOSIS — R1013 Epigastric pain: Secondary | ICD-10-CM | POA: Diagnosis not present

## 2014-01-10 DIAGNOSIS — M109 Gout, unspecified: Secondary | ICD-10-CM | POA: Diagnosis not present

## 2014-01-10 DIAGNOSIS — D696 Thrombocytopenia, unspecified: Secondary | ICD-10-CM

## 2014-01-10 DIAGNOSIS — I129 Hypertensive chronic kidney disease with stage 1 through stage 4 chronic kidney disease, or unspecified chronic kidney disease: Secondary | ICD-10-CM | POA: Diagnosis present

## 2014-01-10 DIAGNOSIS — K7460 Unspecified cirrhosis of liver: Secondary | ICD-10-CM | POA: Diagnosis not present

## 2014-01-10 DIAGNOSIS — K7469 Other cirrhosis of liver: Secondary | ICD-10-CM

## 2014-01-10 DIAGNOSIS — I5032 Chronic diastolic (congestive) heart failure: Secondary | ICD-10-CM

## 2014-01-10 DIAGNOSIS — K922 Gastrointestinal hemorrhage, unspecified: Secondary | ICD-10-CM | POA: Diagnosis not present

## 2014-01-10 DIAGNOSIS — R590 Localized enlarged lymph nodes: Secondary | ICD-10-CM

## 2014-01-10 DIAGNOSIS — R5383 Other fatigue: Secondary | ICD-10-CM | POA: Diagnosis not present

## 2014-01-10 DIAGNOSIS — I10 Essential (primary) hypertension: Secondary | ICD-10-CM

## 2014-01-10 DIAGNOSIS — E119 Type 2 diabetes mellitus without complications: Secondary | ICD-10-CM

## 2014-01-10 DIAGNOSIS — Q2730 Arteriovenous malformation, site unspecified: Secondary | ICD-10-CM

## 2014-01-10 DIAGNOSIS — Z888 Allergy status to other drugs, medicaments and biological substances status: Secondary | ICD-10-CM

## 2014-01-10 DIAGNOSIS — M19079 Primary osteoarthritis, unspecified ankle and foot: Secondary | ICD-10-CM | POA: Diagnosis not present

## 2014-01-10 DIAGNOSIS — I5033 Acute on chronic diastolic (congestive) heart failure: Secondary | ICD-10-CM | POA: Diagnosis present

## 2014-01-10 DIAGNOSIS — I4891 Unspecified atrial fibrillation: Secondary | ICD-10-CM | POA: Diagnosis present

## 2014-01-10 DIAGNOSIS — K2990 Gastroduodenitis, unspecified, without bleeding: Secondary | ICD-10-CM

## 2014-01-10 DIAGNOSIS — D731 Hypersplenism: Secondary | ICD-10-CM | POA: Diagnosis present

## 2014-01-10 DIAGNOSIS — N179 Acute kidney failure, unspecified: Secondary | ICD-10-CM | POA: Diagnosis not present

## 2014-01-10 DIAGNOSIS — E785 Hyperlipidemia, unspecified: Secondary | ICD-10-CM | POA: Diagnosis present

## 2014-01-10 DIAGNOSIS — Z954 Presence of other heart-valve replacement: Secondary | ICD-10-CM

## 2014-01-10 DIAGNOSIS — I4510 Unspecified right bundle-branch block: Secondary | ICD-10-CM | POA: Diagnosis not present

## 2014-01-10 DIAGNOSIS — Z803 Family history of malignant neoplasm of breast: Secondary | ICD-10-CM

## 2014-01-10 DIAGNOSIS — K31811 Angiodysplasia of stomach and duodenum with bleeding: Secondary | ICD-10-CM

## 2014-01-10 DIAGNOSIS — D6959 Other secondary thrombocytopenia: Secondary | ICD-10-CM | POA: Diagnosis present

## 2014-01-10 DIAGNOSIS — M79609 Pain in unspecified limb: Secondary | ICD-10-CM | POA: Diagnosis not present

## 2014-01-10 DIAGNOSIS — D131 Benign neoplasm of stomach: Secondary | ICD-10-CM | POA: Diagnosis present

## 2014-01-10 DIAGNOSIS — E663 Overweight: Secondary | ICD-10-CM

## 2014-01-10 DIAGNOSIS — Z885 Allergy status to narcotic agent status: Secondary | ICD-10-CM

## 2014-01-10 DIAGNOSIS — Z6841 Body Mass Index (BMI) 40.0 and over, adult: Secondary | ICD-10-CM | POA: Diagnosis not present

## 2014-01-10 DIAGNOSIS — N170 Acute kidney failure with tubular necrosis: Secondary | ICD-10-CM

## 2014-01-10 DIAGNOSIS — J449 Chronic obstructive pulmonary disease, unspecified: Secondary | ICD-10-CM | POA: Diagnosis present

## 2014-01-10 DIAGNOSIS — K219 Gastro-esophageal reflux disease without esophagitis: Secondary | ICD-10-CM | POA: Diagnosis not present

## 2014-01-10 DIAGNOSIS — N62 Hypertrophy of breast: Secondary | ICD-10-CM | POA: Diagnosis present

## 2014-01-10 DIAGNOSIS — Z833 Family history of diabetes mellitus: Secondary | ICD-10-CM

## 2014-01-10 DIAGNOSIS — R0600 Dyspnea, unspecified: Secondary | ICD-10-CM

## 2014-01-10 DIAGNOSIS — Q279 Congenital malformation of peripheral vascular system, unspecified: Secondary | ICD-10-CM | POA: Diagnosis not present

## 2014-01-10 DIAGNOSIS — K2970 Gastritis, unspecified, without bleeding: Secondary | ICD-10-CM | POA: Diagnosis present

## 2014-01-10 DIAGNOSIS — I5022 Chronic systolic (congestive) heart failure: Secondary | ICD-10-CM

## 2014-01-10 DIAGNOSIS — Z951 Presence of aortocoronary bypass graft: Secondary | ICD-10-CM | POA: Diagnosis not present

## 2014-01-10 DIAGNOSIS — R0602 Shortness of breath: Secondary | ICD-10-CM

## 2014-01-10 DIAGNOSIS — I2510 Atherosclerotic heart disease of native coronary artery without angina pectoris: Secondary | ICD-10-CM | POA: Diagnosis present

## 2014-01-10 DIAGNOSIS — K921 Melena: Principal | ICD-10-CM | POA: Diagnosis present

## 2014-01-10 DIAGNOSIS — T502X5A Adverse effect of carbonic-anhydrase inhibitors, benzothiadiazides and other diuretics, initial encounter: Secondary | ICD-10-CM | POA: Diagnosis present

## 2014-01-10 DIAGNOSIS — J4489 Other specified chronic obstructive pulmonary disease: Secondary | ICD-10-CM | POA: Diagnosis present

## 2014-01-10 DIAGNOSIS — R188 Other ascites: Secondary | ICD-10-CM

## 2014-01-10 DIAGNOSIS — J90 Pleural effusion, not elsewhere classified: Secondary | ICD-10-CM

## 2014-01-10 DIAGNOSIS — D509 Iron deficiency anemia, unspecified: Secondary | ICD-10-CM

## 2014-01-10 DIAGNOSIS — Z87891 Personal history of nicotine dependence: Secondary | ICD-10-CM

## 2014-01-10 DIAGNOSIS — R5381 Other malaise: Secondary | ICD-10-CM | POA: Diagnosis not present

## 2014-01-10 DIAGNOSIS — K2901 Acute gastritis with bleeding: Secondary | ICD-10-CM

## 2014-01-10 DIAGNOSIS — R059 Cough, unspecified: Secondary | ICD-10-CM

## 2014-01-10 DIAGNOSIS — Z8249 Family history of ischemic heart disease and other diseases of the circulatory system: Secondary | ICD-10-CM

## 2014-01-10 DIAGNOSIS — N184 Chronic kidney disease, stage 4 (severe): Secondary | ICD-10-CM | POA: Diagnosis present

## 2014-01-10 DIAGNOSIS — Z8719 Personal history of other diseases of the digestive system: Secondary | ICD-10-CM

## 2014-01-10 DIAGNOSIS — D500 Iron deficiency anemia secondary to blood loss (chronic): Secondary | ICD-10-CM | POA: Diagnosis not present

## 2014-01-10 DIAGNOSIS — Z7982 Long term (current) use of aspirin: Secondary | ICD-10-CM

## 2014-01-10 DIAGNOSIS — N182 Chronic kidney disease, stage 2 (mild): Secondary | ICD-10-CM

## 2014-01-10 DIAGNOSIS — R739 Hyperglycemia, unspecified: Secondary | ICD-10-CM

## 2014-01-10 DIAGNOSIS — Z794 Long term (current) use of insulin: Secondary | ICD-10-CM

## 2014-01-10 DIAGNOSIS — R195 Other fecal abnormalities: Secondary | ICD-10-CM | POA: Diagnosis not present

## 2014-01-10 DIAGNOSIS — I350 Nonrheumatic aortic (valve) stenosis: Secondary | ICD-10-CM

## 2014-01-10 DIAGNOSIS — R05 Cough: Secondary | ICD-10-CM

## 2014-01-10 LAB — BASIC METABOLIC PANEL
BUN: 96 mg/dL — ABNORMAL HIGH (ref 6–23)
BUN: 97 mg/dL — ABNORMAL HIGH (ref 6–23)
CHLORIDE: 90 meq/L — AB (ref 96–112)
CO2: 28 meq/L (ref 19–32)
CO2: 26 mEq/L (ref 19–32)
Calcium: 9.9 mg/dL (ref 8.4–10.5)
Calcium: 9.7 mg/dL (ref 8.4–10.5)
Chloride: 88 mEq/L — ABNORMAL LOW (ref 96–112)
Creatinine, Ser: 1.97 mg/dL — ABNORMAL HIGH (ref 0.50–1.35)
Creatinine, Ser: 2.41 mg/dL — ABNORMAL HIGH (ref 0.50–1.35)
GFR calc Af Amer: 39 mL/min — ABNORMAL LOW (ref >90)
GFR calc non Af Amer: 34 mL/min — ABNORMAL LOW (ref >90)
GFR calc non Af Amer: 27 mL/min — ABNORMAL LOW (ref >90)
GFR, EST AFRICAN AMERICAN: 31 mL/min — AB (ref >90)
GLUCOSE: 167 mg/dL — AB (ref 70–99)
GLUCOSE: 160 mg/dL — AB (ref 70–99)
Potassium: 3 mEq/L — ABNORMAL LOW (ref 3.7–5.3)
Potassium: 3.4 mEq/L — ABNORMAL LOW (ref 3.7–5.3)
Sodium: 136 mEq/L — ABNORMAL LOW (ref 137–147)
Sodium: 134 mEq/L — ABNORMAL LOW (ref 137–147)

## 2014-01-10 LAB — CBC WITH DIFFERENTIAL/PLATELET
Basophils Absolute: 0.1 10*3/uL (ref 0.0–0.1)
Basophils Relative: 1 % (ref 0–1)
Eosinophils Absolute: 0.2 10*3/uL (ref 0.0–0.7)
Eosinophils Relative: 2 % (ref 0–5)
HCT: 22.1 % — ABNORMAL LOW (ref 39.0–52.0)
HEMOGLOBIN: 6.7 g/dL — AB (ref 13.0–17.0)
LYMPHS ABS: 1.3 10*3/uL (ref 0.7–4.0)
LYMPHS PCT: 15 % (ref 12–46)
MCH: 26.4 pg (ref 26.0–34.0)
MCHC: 30.3 g/dL (ref 30.0–36.0)
MCV: 87 fL (ref 78.0–100.0)
MONOS PCT: 10 % (ref 3–12)
Monocytes Absolute: 0.9 10*3/uL (ref 0.1–1.0)
NEUTROS ABS: 6.4 10*3/uL (ref 1.7–7.7)
NEUTROS PCT: 73 % (ref 43–77)
Platelets: 176 10*3/uL (ref 150–400)
RBC: 2.54 MIL/uL — ABNORMAL LOW (ref 4.22–5.81)
RDW: 16.9 % — ABNORMAL HIGH (ref 11.5–15.5)
WBC: 8.8 10*3/uL (ref 4.0–10.5)

## 2014-01-10 LAB — CBC
HCT: 24.6 % — ABNORMAL LOW (ref 39.0–52.0)
HEMOGLOBIN: 7.5 g/dL — AB (ref 13.0–17.0)
MCH: 26.9 pg (ref 26.0–34.0)
MCHC: 30.5 g/dL (ref 30.0–36.0)
MCV: 88.2 fL (ref 78.0–100.0)
Platelets: 157 10*3/uL (ref 150–400)
RBC: 2.79 MIL/uL — ABNORMAL LOW (ref 4.22–5.81)
RDW: 16.9 % — ABNORMAL HIGH (ref 11.5–15.5)
WBC: 8.2 10*3/uL (ref 4.0–10.5)

## 2014-01-10 LAB — FOLATE: Folate: >20 ng/mL

## 2014-01-10 LAB — PREPARE RBC (CROSSMATCH)

## 2014-01-10 LAB — RETICULOCYTES
RBC.: 2.45 MIL/uL — ABNORMAL LOW (ref 4.22–5.81)
RETIC CT PCT: 9.3 % — AB (ref 0.4–3.1)
Retic Count, Absolute: 227.9 10*3/uL — ABNORMAL HIGH (ref 19.0–186.0)

## 2014-01-10 LAB — SODIUM, URINE, RANDOM: Sodium, Ur: 61 mEq/L

## 2014-01-10 LAB — FERRITIN: Ferritin: 12 ng/mL — ABNORMAL LOW (ref 22–322)

## 2014-01-10 LAB — POC OCCULT BLOOD, ED: Fecal Occult Bld: POSITIVE — AB

## 2014-01-10 LAB — IRON AND TIBC
IRON: 32 ug/dL — AB (ref 42–135)
SATURATION RATIOS: 6 % — AB (ref 20–55)
TIBC: 515 ug/dL — AB (ref 215–435)
UIBC: 483 ug/dL — ABNORMAL HIGH (ref 125–400)

## 2014-01-10 LAB — GLUCOSE, CAPILLARY: GLUCOSE-CAPILLARY: 152 mg/dL — AB (ref 70–99)

## 2014-01-10 LAB — HEMOGLOBIN A1C
Hgb A1c MFr Bld: 5.8 % — ABNORMAL HIGH (ref <5.7)
MEAN PLASMA GLUCOSE: 120 mg/dL — AB (ref <117)

## 2014-01-10 LAB — CREATININE, URINE, RANDOM: CREATININE, URINE: 64.66 mg/dL

## 2014-01-10 LAB — PROTIME-INR
INR: 1.05 (ref 0.00–1.49)
Prothrombin Time: 13.5 seconds (ref 11.6–15.2)

## 2014-01-10 LAB — OSMOLALITY: Osmolality: 310 mOsm/kg — ABNORMAL HIGH (ref 275–300)

## 2014-01-10 LAB — VITAMIN B12: VITAMIN B 12: 782 pg/mL (ref 211–911)

## 2014-01-10 LAB — MRSA PCR SCREENING: MRSA by PCR: NEGATIVE

## 2014-01-10 MED ORDER — ONDANSETRON HCL 4 MG/2ML IJ SOLN
4.0000 mg | Freq: Four times a day (QID) | INTRAMUSCULAR | Status: DC | PRN
  Administered 2014-01-13: 4 mg via INTRAVENOUS
  Filled 2014-01-10: qty 2

## 2014-01-10 MED ORDER — GUAIFENESIN-DM 100-10 MG/5ML PO SYRP
5.0000 mL | ORAL_SOLUTION | ORAL | Status: DC | PRN
  Filled 2014-01-10: qty 5

## 2014-01-10 MED ORDER — TORSEMIDE 100 MG PO TABS
100.0000 mg | ORAL_TABLET | Freq: Two times a day (BID) | ORAL | Status: DC
  Administered 2014-01-10 – 2014-01-14 (×8): 100 mg via ORAL
  Filled 2014-01-10 (×12): qty 1

## 2014-01-10 MED ORDER — POLYETHYLENE GLYCOL 3350 17 G PO PACK
17.0000 g | PACK | Freq: Every day | ORAL | Status: DC | PRN
  Filled 2014-01-10: qty 1

## 2014-01-10 MED ORDER — SODIUM CHLORIDE 0.9 % IV SOLN
8.0000 mg/h | INTRAVENOUS | Status: DC
  Administered 2014-01-10 – 2014-01-12 (×4): 8 mg/h via INTRAVENOUS
  Filled 2014-01-10 (×8): qty 80

## 2014-01-10 MED ORDER — POTASSIUM CHLORIDE CRYS ER 20 MEQ PO TBCR
40.0000 meq | EXTENDED_RELEASE_TABLET | ORAL | Status: AC
  Administered 2014-01-10: 40 meq via ORAL
  Filled 2014-01-10: qty 2

## 2014-01-10 MED ORDER — PANTOPRAZOLE SODIUM 40 MG IV SOLR: 40.0000 mg | Freq: Two times a day (BID) | INTRAVENOUS | Status: DC

## 2014-01-10 MED ORDER — ONDANSETRON HCL 4 MG PO TABS: 4.0000 mg | ORAL_TABLET | Freq: Four times a day (QID) | ORAL | Status: DC | PRN

## 2014-01-10 MED ORDER — SODIUM CHLORIDE 0.9 % IV SOLN
80.0000 mg | Freq: Once | INTRAVENOUS | Status: DC
  Filled 2014-01-10: qty 80

## 2014-01-10 MED ORDER — INSULIN NPH (HUMAN) (ISOPHANE) 100 UNIT/ML ~~LOC~~ SUSP
15.0000 [IU] | Freq: Two times a day (BID) | SUBCUTANEOUS | Status: DC
  Administered 2014-01-10 – 2014-01-14 (×8): 15 [IU] via SUBCUTANEOUS
  Filled 2014-01-10 (×2): qty 10

## 2014-01-10 MED ORDER — INSULIN ASPART 100 UNIT/ML ~~LOC~~ SOLN
0.0000 [IU] | Freq: Three times a day (TID) | SUBCUTANEOUS | Status: DC
  Administered 2014-01-11 (×2): 2 [IU] via SUBCUTANEOUS
  Administered 2014-01-12 (×2): 1 [IU] via SUBCUTANEOUS
  Administered 2014-01-12: 2 [IU] via SUBCUTANEOUS
  Administered 2014-01-13 (×2): 3 [IU] via SUBCUTANEOUS
  Administered 2014-01-13 – 2014-01-14 (×2): 2 [IU] via SUBCUTANEOUS
  Administered 2014-01-14: 3 [IU] via SUBCUTANEOUS

## 2014-01-10 MED ORDER — VERAPAMIL HCL ER 240 MG PO TBCR
240.0000 mg | EXTENDED_RELEASE_TABLET | Freq: Every day | ORAL | Status: DC
  Administered 2014-01-10: 240 mg via ORAL
  Filled 2014-01-10 (×2): qty 1

## 2014-01-10 MED ORDER — SODIUM CHLORIDE 0.9 % IJ SOLN
3.0000 mL | Freq: Two times a day (BID) | INTRAMUSCULAR | Status: DC
  Administered 2014-01-10 – 2014-01-14 (×5): 3 mL via INTRAVENOUS

## 2014-01-10 MED ORDER — METOLAZONE 2.5 MG PO TABS
2.5000 mg | ORAL_TABLET | ORAL | Status: DC
  Administered 2014-01-13: 2.5 mg via ORAL
  Filled 2014-01-10 (×3): qty 1

## 2014-01-10 MED ORDER — METOPROLOL SUCCINATE ER 100 MG PO TB24
100.0000 mg | ORAL_TABLET | Freq: Two times a day (BID) | ORAL | Status: DC
  Administered 2014-01-10 – 2014-01-13 (×7): 100 mg via ORAL
  Filled 2014-01-10 (×9): qty 1

## 2014-01-10 MED ORDER — ATORVASTATIN CALCIUM 20 MG PO TABS
20.0000 mg | ORAL_TABLET | Freq: Every day | ORAL | Status: DC
  Administered 2014-01-10 – 2014-01-13 (×4): 20 mg via ORAL
  Filled 2014-01-10 (×5): qty 1

## 2014-01-10 MED ORDER — SODIUM CHLORIDE 0.9 % IV SOLN
INTRAVENOUS | Status: DC
  Administered 2014-01-10: 22:00:00 via INTRAVENOUS

## 2014-01-10 MED ORDER — VITAMIN C 500 MG PO TABS
500.0000 mg | ORAL_TABLET | Freq: Two times a day (BID) | ORAL | Status: DC
  Administered 2014-01-10 – 2014-01-14 (×8): 500 mg via ORAL
  Filled 2014-01-10 (×9): qty 1

## 2014-01-10 MED ORDER — HYDROCODONE-ACETAMINOPHEN 5-325 MG PO TABS
1.0000 | ORAL_TABLET | ORAL | Status: DC | PRN
  Administered 2014-01-11 – 2014-01-13 (×6): 2 via ORAL
  Filled 2014-01-10 (×7): qty 2

## 2014-01-10 NOTE — ED Provider Notes (Signed)
CSN: [id number]     Arrival date & time 01/10/14  1704 History   First MD Initiated Contact with Patient 01/10/14 1714     Chief Complaint  Patient presents with  . Anemia      HPI Pt was seen at 1730. Per pt, c/o gradual onset and worsening of persistent generalized weakness/fatigue and lightheadedness for the past 4 to 5 days. Pt states he was evaluated by his PMD and Cards MD for same today. States he had labs checked and was told "to come to the ED to get admitted because my Hgb was 7.3." States he has significant hx of same symptoms due to chronic GI bleeding due to AV malformations. States he "gets transfused a lot." Endorses his stools have been "blacker than their usual black." Denies rectal bleeding, no blood in stools. Denies CP/palpitations, no SOB/cough, no abd pain, no N/V/D.    Past Medical History  Diagnosis Date  . Overweight(278.02)   . CAD (coronary artery disease)     a. s/p CABG 2004;  b. [place] 10/13:  LHC 8/13: Free radial to obtuse marginal patent, SVG-diagonal patent, LIMA-LAD patent, EF 65-70%, mean aortic valve gradient 42  . Atrial fibrillation     Permanent; off of Coumadin for now due to GI bleed  . DM2 (diabetes mellitus, type 2)   . Insomnia   . Carotid bruit 06/14/2011    a. pre-AVR dopplers 10/13: no sig ICA stenosis  . Hypertension   . Chronic diastolic heart failure   . GERD (gastroesophageal reflux disease)   . COPD (chronic obstructive pulmonary disease)   . Iron deficiency anemia     Requiring intravenous iron  . H/O hiatal hernia   . AVM (arteriovenous malformation)     Recurrent GI bleeding requiring multiple transfusions  . Hyperlipidemia   . Thrombocytopenia   . Ascites     status post paracentesis with removal of 3.4 L of ascitic fluid  . Osteoarthritis   . Mediastinal adenopathy 09/22/2011  . Cirrhosis   . Iron deficiency anemia   . Mediastinal adenopathy 09/22/2011  . Aortic stenosis 03/08/2012    a.  s/p tissue AVR 10/13 with Dr.  [name];   b. Echo 10/13: mod LVH, EF 55-60%, tissue AVR not well seen, no leak, gradient not too high (mean 55mmHg), MAC, mild MR, mild LAE, PASP 38  . OSA (obstructive sleep apnea) 1999    DOES NOT USE CPAP  . Blood transfusion without reported diagnosis    Past Surgical History  Procedure Laterality Date  . Coronary artery bypass graft   10/15/2002     [name]. [name], M.D.     . Carpal tunnel release    10/08/2003  . Lipoma surgery    . Hernia repair    . Other surgical history  08/26/2011    [facility],  enteroscopy , revealing "three-four AVMs."   . Tee without cardioversion  03/07/2012    Procedure: TRANSESOPHAGEAL ECHOCARDIOGRAM (TEE);  Surgeon: Thayer Headings, MD;  Location: [facility] ENDOSCOPY;  Service: Cardiovascular;  Laterality: N/A;  . Coronary angioplasty with stent placement  01/19/2005    drug eluting stent to high grade ostial stenosis of radial artery graft to OM  . Aortic valve replacement  04/25/2012    Procedure: AORTIC VALVE REPLACEMENT (AVR);  Surgeon: Melrose Nakayama, MD;  Location: [facility];  Service: Open Heart Surgery;  Laterality: N/A;  . Esophagogastroduodenoscopy N/A 07/31/2013    Procedure: ESOPHAGOGASTRODUODENOSCOPY (EGD);  Surgeon: Milus Banister,  MD;  Location: [place] [facility] ENDOSCOPY;  Service: Endoscopy;  Laterality: N/A;   Family History  Problem Relation Age of Onset  . Coronary artery disease      FAMILY HISTORY  . Hypertension Father   . Diabetes Father   . Heart disease Father   . COPD Sister   . Cancer Maternal Aunt     Breast cancer   . Cancer Maternal Aunt     Breast cancer   . Diabetes Son   . Cancer Daughter     Cervical cancer   History  Substance Use Topics  . Smoking status: Former Smoker -- 1.00 packs/day for 30 years    Types: Cigarettes    Quit date: 07/25/2000  . Smokeless tobacco: Never Used     Comment: Quit smoking in 2002  . Alcohol Use: 0.6 oz/week    1 Shots of liquor per week     Comment: Occasionally    Review of  Systems ROS: Statement: All systems negative except as marked or noted in the HPI; Constitutional: Negative for fever and chills. +genearalized weakness/fatigue. ; ; Eyes: Negative for eye pain, redness and discharge. ; ; ENMT: Negative for ear pain, hoarseness, nasal congestion, sinus pressure and sore throat. ; ; Cardiovascular: Negative for chest pain, palpitations, diaphoresis, dyspnea and peripheral edema. ; ; Respiratory: Negative for cough, wheezing and stridor. ; ; Gastrointestinal: +"black stools." Negative for nausea, vomiting, diarrhea, abdominal pain, blood in stool, hematemesis, jaundice and rectal bleeding. . ; ; Genitourinary: Negative for dysuria, flank pain and hematuria. ; ; Musculoskeletal: Negative for back pain and neck pain. Negative for swelling and trauma.; ; Skin: Negative for pruritus, rash, abrasions, blisters, bruising and skin lesion.; ; Neuro: +lightheadedness. Negative for headache and neck stiffness. Negative for altered level of consciousness , altered mental status, extremity weakness, paresthesias, involuntary movement, seizure and syncope.      Allergies  Codeine; Diltiazem hcl; and Niacin  Home Medications   Prior to Admission medications   Medication Sig Start Date End Date Taking? Authorizing Provider  aspirin EC 325 MG tablet Take 1 tablet (325 mg total) by mouth daily. 12/24/13   Amy D Clegg, NP  atorvastatin (LIPITOR) 20 MG tablet Take 20 mg by mouth at bedtime.     Historical Provider, MD  ferrous gluconate (FERGON) 325 MG tablet Take 325 mg by mouth 2 (two) times daily.     Historical Provider, MD  insulin NPH Human (HUMULIN N,NOVOLIN N) 100 UNIT/ML injection Inject 20-25 Units into the skin 2 (two) times daily before a meal.    Historical Provider, MD  insulin regular (NOVOLIN R,HUMULIN R) 100 units/mL injection Inject 15 Units into the skin 2 (two) times daily before a meal.     Historical Provider, MD  KLOR-CON M10 10 MEQ tablet TAKE 3 TABLETS BY MOUTH  TWICE A DAY    Sherren Mocha, MD  losartan (COZAAR) 50 MG tablet Take 1 tablet (50 mg total) by mouth daily. 12/11/13   Larey Dresser, MD  metFORMIN (GLUCOPHAGE) 1000 MG tablet Take 1,000 mg by mouth 2 (two) times daily with a meal.     Historical Provider, MD  metolazone (ZAROXOLYN) 2.5 MG tablet Take 1 tablet (2.5 mg total) by mouth 2 (two) times a week. Take every Monday and Friday and as needed 11/05/13   Amy D Clegg, NP  metoprolol succinate (TOPROL-XL) 100 MG 24 hr tablet Take 1 tablet (100 mg total) by mouth 2 (two) times daily. Take with or  immediately following a meal. 11/05/13   Amy D Clegg, NP  Multiple Vitamins-Minerals ([employer] SPECTRAVITE ADULT 50+ PO) Take 1 tablet by mouth daily.    Historical Provider, MD  omeprazole (PRILOSEC) 20 MG capsule Take 20 mg by mouth 2 (two) times daily.  06/27/12   Tanda Rockers, MD  torsemide (DEMADEX) 20 MG tablet Take 5 tablets (100 mg total) by mouth 2 (two) times daily. 01/08/14   Jolaine Artist, MD  verapamil (CALAN-SR) 240 MG CR tablet Take 1 tablet (240 mg total) by mouth at bedtime. 12/24/13   Amy D Ninfa Meeker, NP  vitamin C (ASCORBIC ACID) 500 MG tablet Take 500 mg by mouth 2 (two) times daily.    Historical Provider, MD   BP 82/44  Pulse 78  Temp(Src) 98.1 F (36.7 C) (Oral)  Resp 18  Ht 5\' 8"  (1.727 m)  Wt 265 lb (120.203 kg)  BMI 40.30 kg/m2  SpO2 99% Filed Vitals:   01/10/14 1711 01/10/14 1749 01/10/14 1803  BP: 87/55 82/44 102/43  Pulse: 78  126  Temp: 98.1 F (36.7 C)    TempSrc: Oral    Resp: 16 18 19   Height: 5\' 8"  (1.727 m)    Weight: 265 lb (120.203 kg)    SpO2: 100% 99% 100%    Physical Exam 1735; Physical examination:  Nursing notes reviewed; Vital signs and O2 SAT reviewed;  Constitutional: Well developed, Well nourished, Well hydrated, In no acute distress; Head:  Normocephalic, atraumatic; Eyes: EOMI, PERRL, No scleral icterus; ENMT: Mouth and pharynx normal, Mucous membranes moist; Neck: Supple, Full range of motion,  No lymphadenopathy; Cardiovascular: Regular rate and irregular irregular rhythm, No gallop; Respiratory: Breath sounds clear & equal bilaterally, No wheezes.  Speaking full sentences with ease, Normal respiratory effort/excursion; Chest: Nontender, Movement normal; Abdomen: Soft, Nontender, Nondistended, Normal bowel sounds; Genitourinary: No CVA tenderness; Extremities: Pulses normal, No tenderness, +1 pedal edema bilat. No calf asymmetry.; Neuro: AA&Ox3, Major CN grossly intact.  Speech clear. No gross focal motor or sensory deficits in extremities.; Skin: Color pale, Warm, Dry.   ED Course  Procedures     EKG Interpretation None      MDM  MDM Reviewed: previous chart, nursing note and vitals Reviewed previous: labs and ECG Interpretation: labs and ECG Total time providing critical care: 30-74 minutes. This excludes time spent performing separately reportable procedures and services. Consults: admitting MD and gastrointestinal    CRITICAL CARE Performed by: Alfonzo Feller Total critical care time: 35 Critical care time was exclusive of separately billable procedures and treating other patients. Critical care was necessary to treat or prevent imminent or life-threatening deterioration. Critical care was time spent personally by me on the following activities: development of treatment plan with patient and/or surrogate as well as nursing, discussions with consultants, evaluation of patient's response to treatment, examination of patient, obtaining history from patient or surrogate, ordering and performing treatments and interventions, ordering and review of laboratory studies, ordering and review of radiographic studies, pulse oximetry and re-evaluation of patient's condition.    Date: 01/10/2014  Rate: 89  Rhythm: atrial fibrillation  QRS Axis: left  Intervals: normal  ST/T Wave abnormalities: normal  Conduction Disutrbances:right bundle branch block  Narrative  Interpretation:   Old EKG Reviewed: unchanged; no significant changes from previous EKG dated 01/01/2014.  Results for orders placed during the hospital encounter of 35/00/93  BASIC METABOLIC PANEL      Result Value Ref Range   Sodium 136 (*) 137 - 147  mEq/L   Potassium 3.0 (*) 3.7 - 5.3 mEq/L   Chloride 90 (*) 96 - 112 mEq/L   CO2 28  19 - 32 mEq/L   Glucose, Bld 167 (*) 70 - 99 mg/dL   BUN 96 (*) 6 - 23 mg/dL   Creatinine, Ser 1.97 (*) 0.50 - 1.35 mg/dL   Calcium 9.9  8.4 - 10.5 mg/dL   GFR calc non Af Amer 34 (*) >90 mL/min   GFR calc Af Amer 39 (*) >90 mL/min  CBC      Result Value Ref Range   WBC 8.2  4.0 - 10.5 K/uL   RBC 2.79 (*) 4.22 - 5.81 MIL/uL   Hemoglobin 7.5 (*) 13.0 - 17.0 g/dL   HCT 24.6 (*) 39.0 - 52.0 %   MCV 88.2  78.0 - 100.0 fL   MCH 26.9  26.0 - 34.0 pg   MCHC 30.5  30.0 - 36.0 g/dL   RDW 16.9 (*) 11.5 - 15.5 %   Platelets 157  150 - 400 K/uL    1750:  H/H lower than previous. Will transfuse PRBC's. T/C to [facility] Dr. Candiss Norse, case discussed, including:  HPI, pertinent PM/SHx, VS/PE, dx testing, ED course and treatment:  Agreeable to admit, requests to call GI MD for consult.    1840:  T/C back from GI Dr. Collene Mares, case discussed, including:  HPI, pertinent PM/SHx, VS/PE, dx testing, ED course and treatment:  Agreeable to consult, states she is already aware of the pt.         Alfonzo Feller, DO 01/12/14 1517

## 2014-01-10 NOTE — Consult Note (Signed)
Cross cover for [employer] [facility] Reason for Consult: Severe anemia and melenic stools. Referring Physician: [facility]-Dr. Lala Lund.  [name] is an 68 y.o. male.  HPI: 68 year old white male, admitted with a 4 day history of melena, weakness found to be anemia at his cardiologists office this morning. Claims he has some weakness but denies any syncope or near syncope. Had an EGD in January when no AVM'S were noted. He had a small polyp removed during a colonoscopy done in 2012 but the exam was otherwise normal. He denies having any abdominal pain, nausea, vomiting, diarrhea or constipation. He is taking Aspirin 325 mg per day. He usually takes iron supplements for chronic anemia and his stools are dark but over the last 4 days he has had tarry black stools with no frank hematochezia.   Past Medical History  Diagnosis Date  . Overweight(278.02)   . CAD (coronary artery disease)     a. s/p CABG 2004;  b. [place] 10/13:  LHC 8/13: Free radial to obtuse marginal patent, SVG-diagonal patent, LIMA-LAD patent, EF 65-70%, mean aortic valve gradient 42  . Atrial fibrillation     Permanent; off of Coumadin for now due to GI bleed  . DM2 (diabetes mellitus, type 2)   . Insomnia   . Carotid bruit 06/14/2011    a. pre-AVR dopplers 10/13: no sig ICA stenosis  . Hypertension   . Chronic diastolic heart failure   . GERD (gastroesophageal reflux disease)   . COPD (chronic obstructive pulmonary disease)   . Iron deficiency anemia     Requiring intravenous iron  . H/O hiatal hernia   . AVM (arteriovenous malformation)     Recurrent GI bleeding requiring multiple transfusions  . Hyperlipidemia   . Thrombocytopenia   . Ascites     status post paracentesis with removal of 3.4 L of ascitic fluid  . Osteoarthritis   . Mediastinal adenopathy 09/22/2011  . Cirrhosis   . Iron deficiency anemia   . Mediastinal adenopathy 09/22/2011  . Aortic stenosis 03/08/2012    a.  s/p tissue AVR 10/13 with Dr. [name];    b. Echo 10/13: mod LVH, EF 55-60%, tissue AVR not well seen, no leak, gradient not too high (mean 68mHg), MAC, mild MR, mild LAE, PASP 38  . OSA (obstructive sleep apnea) 1999    DOES NOT USE CPAP  . Blood transfusion without reported diagnosis    Past Surgical History  Procedure Laterality Date  . Coronary artery bypass graft   10/15/2002     SRevonda Standard HRoxan Hockey M.D.     . Carpal tunnel release    10/08/2003  . Lipoma surgery    . Hernia repair    . Other [facility]  [facility] "three-four [facility]."   . Tee without cardioversion  03/07/2012    Procedure: TRANSESOPHAGEAL ECHOCARDIOGRAM (TEE);  Surgeon: PThayer Headings MD;  [facility]  Service: Cardiovascular;  Laterality: N/A;  . Coronary angioplasty with stent placement  01/19/2005    drug eluting stent to high grade ostial stenosis of radial artery graft to OM  . Aortic valve replacement  04/25/2012    Procedure: AORTIC VALVE REPLACEMENT (AVR);  Surgeon: SMelrose Nakayama MD;  [place] [facility]  Service: Open Heart Surgery;  Laterality: N/A;  . Esophagogastroduodenoscopy N/A 07/31/2013    Procedure: ESOPHAGOGASTRODUODENOSCOPY (EGD);  Surgeon: DMilus Banister MD;  [place] [facility]  Service: Endoscopy;  Laterality: N/A;  Family History  Problem Relation Age of Onset  . Coronary artery disease      FAMILY HISTORY  . Hypertension Father   . Diabetes Father   . Heart disease Father   . COPD Sister   . Cancer Maternal Aunt     Breast cancer   . Cancer Maternal Aunt     Breast cancer   . Diabetes Son   . Cancer Daughter     Cervical cancer   Social History:  reports that he quit smoking about 13 years ago. His smoking use included Cigarettes. He has a 30 pack-year smoking history. He has never used smokeless tobacco. He reports that he drinks about .6 ounces of alcohol per week. He reports that he does not use illicit drugs.  Allergies:  Allergies  Allergen Reactions   . Codeine Other (See Comments)    Hurting in chest  . Diltiazem Hcl Itching  . Niacin Other (See Comments)    Hot flashes   Medications: I have reviewed the patient's current medications.  Results for orders placed during the hospital encounter of 01/10/14 (from the past 48 hour(s))  BASIC METABOLIC PANEL     Status: Abnormal   Collection Time    01/10/14  5:20 PM      Result Value Ref Range   Sodium 134 (*) 137 - 147 mEq/L   Potassium 3.4 (*) 3.7 - 5.3 mEq/L   Chloride 88 (*) 96 - 112 mEq/L   CO2 26  19 - 32 mEq/L   Glucose, Bld 160 (*) 70 - 99 mg/dL   BUN 97 (*) 6 - 23 mg/dL   Creatinine, Ser 2.41 (*) 0.50 - 1.35 mg/dL   Calcium 9.7  8.4 - 10.5 mg/dL   GFR calc non Af Amer 27 (*) >90 mL/min   GFR calc Af Amer 31 (*) >90 mL/min   Comment: (NOTE)     The eGFR has been calculated using the CKD EPI equation.     This calculation has not been validated in all clinical situations.     eGFR's persistently <90 mL/min signify possible Chronic Kidney     Disease.  CBC WITH DIFFERENTIAL     Status: Abnormal   Collection Time    01/10/14  5:20 PM      Result Value Ref Range   WBC 8.8  4.0 - 10.5 K/uL   RBC 2.54 (*) 4.22 - 5.81 MIL/uL   Hemoglobin 6.7 (*) 13.0 - 17.0 g/dL   Comment: REPEATED TO VERIFY     CRITICAL RESULT CALLED TO, READ BACK BY AND VERIFIED WITH:     J GAGE RN 1812 01/10/14 A BROWNING   HCT 22.1 (*) 39.0 - 52.0 %   MCV 87.0  78.0 - 100.0 fL   MCH 26.4  26.0 - 34.0 pg   MCHC 30.3  30.0 - 36.0 g/dL   RDW 16.9 (*) 11.5 - 15.5 %   Platelets 176  150 - 400 K/uL   Neutrophils Relative % 73  43 - 77 %   Neutro Abs 6.4  1.7 - 7.7 K/uL   Lymphocytes Relative 15  12 - 46 %   Lymphs Abs 1.3  0.7 - 4.0 K/uL   Monocytes Relative 10  3 - 12 %   Monocytes Absolute 0.9  0.1 - 1.0 K/uL   Eosinophils Relative 2  0 - 5 %   Eosinophils Absolute 0.2  0.0 - 0.7 K/uL   Basophils Relative 1  0 -  1 %   Basophils Absolute 0.1  0.0 - 0.1 K/uL  TYPE AND SCREEN     Status: None    Collection Time    01/10/14  5:40 PM      Result Value Ref Range   ABO/RH(D) A POS     Antibody Screen POS     Sample Expiration 01/13/2014     DAT, IgG NEG     Antibody Identification ANTI E     Unit Number [id number]     Blood Component Type RED CELLS,LR     Unit division 00     Status of Unit ALLOCATED     Transfusion Status OK TO TRANSFUSE     Crossmatch Result COMPATIBLE     Donor AG Type NEGATIVE FOR E ANTIGEN NEGATIVE FOR KELL ANTIGEN     Unit Number [id number]     Blood Component Type RED CELLS,LR     Unit division 00     Status of Unit ALLOCATED     Transfusion Status OK TO TRANSFUSE     Crossmatch Result COMPATIBLE     Donor AG Type NEGATIVE FOR E ANTIGEN NEGATIVE FOR KELL ANTIGEN    PREPARE RBC (CROSSMATCH)     Status: None   Collection Time    01/10/14  5:40 PM      Result Value Ref Range   Order Confirmation ORDER PROCESSED BY BLOOD BANK    PROTIME-INR     Status: None   Collection Time    01/10/14  5:40 PM      Result Value Ref Range   Prothrombin Time 13.5  11.6 - 15.2 seconds   INR 1.05  0.00 - 1.49  RETICULOCYTES     Status: Abnormal   Collection Time    01/10/14  6:00 PM      Result Value Ref Range   Retic Ct Pct 9.3 (*) 0.4 - 3.1 %   RBC. 2.45 (*) 4.22 - 5.81 MIL/uL   Retic Count, Manual 227.9 (*) 19.0 - 186.0 K/uL  POC OCCULT BLOOD, ED     Status: Abnormal   Collection Time    01/10/14  6:14 PM      Result Value Ref Range   Fecal Occult Bld POSITIVE (*) NEGATIVE   No results found.  Review of Systems  Constitutional: Positive for malaise/fatigue. Negative for fever and chills.  HENT: Negative.   Respiratory: Positive for shortness of breath. Negative for cough, hemoptysis, sputum production and wheezing.   Cardiovascular: Negative.   Gastrointestinal: Positive for blood in stool and melena. Negative for heartburn, nausea, vomiting and abdominal pain.  Neurological: Positive for weakness.  Psychiatric/Behavioral: Negative for depression,  suicidal ideas, hallucinations, memory loss and substance abuse. The patient is nervous/anxious.    Blood pressure 94/68, pulse 101, temperature 98.1 F (36.7 C), temperature source Oral, resp. rate 17, height 5' 8"  (1.727 m), weight 120.2 kg (264 lb 15.9 oz), SpO2 99.00%. Physical Exam  Constitutional: He appears well-developed and well-nourished.  HENT:  Head: Normocephalic and atraumatic.  Eyes: Conjunctivae and EOM are normal. Pupils are equal, round, and reactive to light.  Neck: Normal range of motion. Neck supple.  Cardiovascular: Normal rate and regular rhythm.   Respiratory: Effort normal and breath sounds normal.  Midline  Scar present  GI: Soft. Normal appearance and bowel sounds are normal.  Morbidly obese  Skin: Skin is warm and dry.  Psychiatric: His speech is normal and behavior is normal. Judgment and thought content normal. Cognition  and memory are normal.   Assessment/Plan: 1) Melenic stools with severe anemia; will transfuse tonight and do an EGD in AM. 2) NASH/Non-alcoholic cirrhosis/Morbid obesity. 3) Hypertension/Chronic diastolic CHF.[name] 4) Severe aortic stennosis.  5) Type II DM. 6) CKD. 7) Chronic atrial fibrillation. 8) COPD.  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 MANN,JYOTHI 01/10/2014, 7:37 PM

## 2014-01-10 NOTE — ED Notes (Signed)
Admitting physician at bedside

## 2014-01-10 NOTE — ED Notes (Signed)
Attempted report X1

## 2014-01-10 NOTE — ED Notes (Addendum)
He was sent from doctor for low HGB on lab work today. He states hes felt nauseated, lightheaded and weak. hes had anemia and blood transfusion in the past.

## 2014-01-10 NOTE — H&P (Signed)
Patient Demographics  [name], is a 68 y.o. male  MRN: [id number]   DOB - 1945/11/16  Admit Date - 01/10/2014  Outpatient Primary MD for the patient is [name], MD   With History of -  Past Medical History  Diagnosis Date  . Overweight(278.02)   . CAD (coronary artery disease)     a. s/p CABG 2004;  b. [place] 10/13:  LHC 8/13: Free radial to obtuse marginal patent, SVG-diagonal patent, LIMA-LAD patent, EF 65-70%, mean aortic valve gradient 42  . Atrial fibrillation     Permanent; off of Coumadin for now due to GI bleed  . DM2 (diabetes mellitus, type 2)   . Insomnia   . Carotid bruit 06/14/2011    a. pre-AVR dopplers 10/13: no sig ICA stenosis  . Hypertension   . Chronic diastolic heart failure   . GERD (gastroesophageal reflux disease)   . COPD (chronic obstructive pulmonary disease)   . Iron deficiency anemia     Requiring intravenous iron  . H/O hiatal hernia   . AVM (arteriovenous malformation)     Recurrent GI bleeding requiring multiple transfusions  . Hyperlipidemia   . Thrombocytopenia   . Ascites     status post paracentesis with removal of 3.4 L of ascitic fluid  . Osteoarthritis   . Mediastinal adenopathy 09/22/2011  . Cirrhosis   . Iron deficiency anemia   . Mediastinal adenopathy 09/22/2011  . Aortic stenosis 03/08/2012    a.  s/p tissue AVR 10/13 with Dr. [name];   b. Echo 10/13: mod LVH, EF 55-60%, tissue AVR not well seen, no leak, gradient not too high (mean 30mmHg), MAC, mild MR, mild LAE, PASP 38  . OSA (obstructive sleep apnea) 1999    DOES NOT USE CPAP  . Blood transfusion without reported diagnosis       Past Surgical History  Procedure Laterality Date  . Coronary artery bypass graft   10/15/2002     [name]. [name], M.D.     . Carpal tunnel release     10/08/2003  . Lipoma surgery    . Hernia repair    . Other surgical history  08/26/2011    [facility],  enteroscopy , revealing "three-four AVMs."   . Tee without cardioversion  03/07/2012    Procedure: TRANSESOPHAGEAL ECHOCARDIOGRAM (TEE);  Surgeon: Thayer Headings, MD;  Location: [facility] ENDOSCOPY;  Service: Cardiovascular;  Laterality: N/A;  . Coronary angioplasty with stent placement  01/19/2005    drug eluting stent to high grade ostial stenosis of radial artery graft to OM  . Aortic valve replacement  04/25/2012    Procedure: AORTIC VALVE REPLACEMENT (AVR);  Surgeon: Melrose Nakayama, MD;  Location: [place];  Service: Open Heart Surgery;  Laterality: N/A;  . Esophagogastroduodenoscopy N/A 07/31/2013    Procedure: ESOPHAGOGASTRODUODENOSCOPY (EGD);  Surgeon: Milus Banister, MD;  Location: [place];  Service: Endoscopy;  Laterality: N/A;    in for  Chief Complaint  Patient presents with  . Anemia     HPI  [name]  is a 68 y.o. male, CAD status post CABG, history of small bowel AVMs and gastritis, NASH with portal hypertension but recent EGD did not show any varesis, EGD done a few months ago, portal hypertension related thrombocytopenia, dyslipidemia, COPD, GERD, chronic diastolic heart failure EF 55%, status post aortic valve replacement-bovine valve, atrial fibrillation not on anticoagulation due to recurrent GI bleeds who was feeling weak for the last few days and has noticed that his blood has become increasingly dark, also mild nausea without any emesis, went to PCP office early this morning when he was found to be anemic and then sent to the ER.   In the ER he was found to be anemic again, baseline hemoglobin runs around 9.5 currently he is on 6.7, he strongly Hemoccult positive in the ER. I was called to admit the patient for upper GI bleed related iron deficiency anemia acute on chronic.    Review of Systems    In addition to the HPI above,   No Fever-chills, No Headache,  No changes with Vision or hearing, No problems swallowing food or Liquids, No Chest pain, Cough or Shortness of Breath, No Abdominal pain, No Nausea or Vommitting, Bowel movements are regular, stool is dark, No Blood in stool or Urine, No dysuria, No new skin rashes or bruises, No new joints pains-aches,  No new weakness, tingling, numbness in any extremity, generalized weakness No recent weight gain or loss, No polyuria, polydypsia or polyphagia, No significant Mental Stressors.  A full 10 point Review of Systems was done, except as stated above, all other Review of Systems were negative.   Social History History  Substance Use Topics  . Smoking status: Former Smoker -- 1.00 packs/day for 30 years    Types: Cigarettes    Quit date: 07/25/2000  . Smokeless tobacco: Never Used     Comment: Quit smoking in 2002  . Alcohol Use: 0.6 oz/week    1 Shots of liquor per week     Comment: Occasionally      Family History Family History  Problem Relation Age of Onset  . Coronary artery disease      FAMILY HISTORY  . Hypertension Father   . Diabetes Father   . Heart disease Father   . COPD Sister   . Cancer Maternal Aunt     Breast cancer   . Cancer Maternal Aunt     Breast cancer   . Diabetes Son   . Cancer Daughter     Cervical cancer      Prior to Admission medications   Medication Sig Start Date End Date Taking? Authorizing Kyros Salzwedel  aspirin EC 325 MG tablet Take 1 tablet (325 mg total) by mouth daily. 12/24/13   Amy D Clegg, NP  atorvastatin (LIPITOR) 20 MG tablet Take 20 mg by mouth at bedtime.     Historical Christy Ehrsam, MD  ferrous gluconate (FERGON) 325 MG tablet Take 325 mg by mouth 2 (two) times daily.     Historical Zayden Hahne, MD  insulin NPH Human (HUMULIN N,NOVOLIN N) 100 UNIT/ML injection Inject 20-25 Units into the skin 2 (two) times daily before a meal.    Historical Waldo Damian, MD  insulin regular (NOVOLIN R,HUMULIN R) 100 units/mL injection Inject 15 Units into  the skin 2 (two) times daily before a meal.     Historical Shandale Malak, MD  KLOR-CON M10 10 MEQ tablet TAKE  [place] TWICE A DAY    Sherren Mocha, MD  losartan (COZAAR) 50 MG tablet Take 1 tablet (50 mg total) by mouth daily. 12/11/13   Larey Dresser, MD  metFORMIN (GLUCOPHAGE) 1000 MG tablet Take 1,000 mg by mouth 2 (two) times daily with a meal.     Historical Benigno Check, MD  metolazone (ZAROXOLYN) 2.5 MG tablet Take 1 tablet (2.5 mg total) by mouth 2 (two) times a week. Take every Monday and Friday and as needed 11/05/13   Amy D Clegg, NP  metoprolol succinate (TOPROL-XL) 100 MG 24 hr tablet Take 1 tablet (100 mg total) by mouth 2 (two) times daily. Take with or immediately following a meal. 11/05/13   Amy D Clegg, NP  Multiple Vitamins-Minerals ([employer] SPECTRAVITE ADULT 50+ PO) Take 1 tablet by mouth daily.    Historical Samanthajo Payano, MD  omeprazole (PRILOSEC) 20 MG capsule Take 20 mg by mouth 2 (two) times daily.  06/27/12   Tanda Rockers, MD  torsemide (DEMADEX) 20 MG tablet Take 5 tablets (100 mg total) by mouth 2 (two) times daily. 01/08/14   Jolaine Artist, MD  verapamil (CALAN-SR) 240 MG CR tablet Take 1 tablet (240 mg total) by mouth at bedtime. 12/24/13   Amy D Ninfa Meeker, NP  vitamin C (ASCORBIC ACID) 500 MG tablet Take 500 mg by mouth 2 (two) times daily.    Historical May Manrique, MD    Allergies  Allergen Reactions  . Codeine Other (See Comments)    Hurting in chest  . Diltiazem Hcl Itching  . Niacin Other (See Comments)    Hot flashes    Physical Exam  Vitals  Blood pressure 102/43, pulse 93, temperature 98.1 F (36.7 C), temperature source Oral, resp. rate 19, height 5\' 8"  (1.727 m), weight 120.203 kg (265 lb), SpO2 100.00%.   1. General middle-aged Caucasian male lying in bed in NAD,     2. Normal affect and insight, Not Suicidal or Homicidal, Awake Alert, Oriented X 3.  3. No F.N deficits, ALL C.Nerves Intact, Strength 5/5 all 4 extremities, Sensation intact all 4  extremities, Plantars down going.  4. Ears and Eyes appear Normal, Conjunctivae clear, PERRLA. Moist Oral Mucosa.  5. Supple Neck, No JVD, No cervical lymphadenopathy appriciated, No Carotid Bruits.  6. Symmetrical Chest wall movement, Good air movement bilaterally, CTAB.  7. RRR, No Gallops, Rubs or Murmurs, No Parasternal Heave.  8. Positive Bowel Sounds, Abdomen Soft, No tenderness, No organomegaly appriciated,No rebound -guarding or rigidity. Strongly positive occult blood on rectal exam. No bright red blood  9.  No Cyanosis, Normal Skin Turgor, No Skin Rash or Bruise.  10. Good muscle tone,  joints appear normal , no effusions, Normal ROM.  11. No Palpable Lymph Nodes in Neck or Axillae     Data Review  CBC  Recent Labs Lab 01/10/14 0901 01/10/14 1720  WBC 8.2 8.8  HGB 7.5* 6.7*  HCT 24.6* 22.1*  PLT 157 176  MCV 88.2 87.0  MCH 26.9 26.4  MCHC 30.5 30.3  RDW 16.9* 16.9*  LYMPHSABS  --  1.3  MONOABS  --  0.9  EOSABS  --  0.2  BASOSABS  --  0.1   ------------------------------------------------------------------------------------------------------------------  Chemistries   Recent Labs Lab 01/10/14 0847  NA 136*  K 3.0*  CL 90*  CO2 28  GLUCOSE 167*  BUN 96*  CREATININE 1.97*  CALCIUM 9.9   ------------------------------------------------------------------------------------------------------------------ estimated creatinine clearance is 47.1 ml/min (by C-G formula based  on Cr of 1.97). ------------------------------------------------------------------------------------------------------------------ No results found for this basename: TSH, T4TOTAL, FREET3, T3FREE, THYROIDAB,  in the last 72 hours   Coagulation profile No results found for this basename: INR, PROTIME,  in the last 168 hours ------------------------------------------------------------------------------------------------------------------- No results found for this basename: DDIMER,   in the last 72 hours -------------------------------------------------------------------------------------------------------------------  Cardiac Enzymes No results found for this basename: CK, CKMB, TROPONINI, MYOGLOBIN,  in the last 168 hours ------------------------------------------------------------------------------------------------------------------ No components found with this basename: POCBNP,     Imaging results:   No results found.  My personal review of EKG: Baseline EKG ordered    Assessment & Plan   1. Acute on chronic iron deficiency anemia due to upper GI bleed - assisted with his history of gastritis and small bowel AVMs, will be admitted to step down bed, telemetry monitor, transfuse 2 units of packed RBC, hold 2 units at all times, anemia panel, hold aspirin, monitor H&H closely, IV PPI drip, case discussed with GI physician on call Dr. Collene Mares who will see the patient shortly. Note on recent EGD no evidence of esophageal varices, gastritis noted. Clear liquid diet.    2.NASH with portal hypertension and thrombocytopenia. No acute issues supportive care. Check baseline INR.     3. Chronic diastolic CHF. For now we'll continue home dose diuretic, and monitor BMP closely.     4. History of CAD and atrial fibrillation. No acute issues, no chest pain, currently in sinus tachycardia, obtain baseline EKG, for now aspirin will be held due to #1 above.    5. Acute renal failure and chronic kidney disease stage III. Baseline creatinine is around 1.5, acute renal failure due to #1 above, transfuse, no Lasix between transfusion today, check urine electrolytes and repeat BMP in the morning. He is currently being continued on home dose diuretic readdress in the morning in case creatinine not improve. No evidence of fluid overload on exam. Hold Cozaar.     6. DM type II. Check A1c, trace amount NPH 15 units twice a day instead of 25, low-dose sliding scale insulin with  meals. Hold Glucophage.     7. Dyslipidemia. Continue home dose statin.     8. Hypertension. Currently blood pressure on the softer side, will hold Cozaar, continue verapamil and monitor closely.       DVT Prophylaxis  SCDs   AM Labs Ordered, also please review Full Orders  Family Communication: Admission, patients condition and plan of care including tests being ordered have been discussed with the patient and wife who indicate understanding and agree with the plan and Code Status.  Code Status full  Likely DC to  home  Condition GUARDED    Time spent in minutes : 35    Lala Lund K M.D on 01/10/2014 at 6:16 PM  Between 7am to 7pm - Pager - [phone]  After 7pm go to [email or web address] - password [email or web address]  And look for the night coverage person covering me after hours  [facility] Office  [phone]   **Disclaimer: This note may have been dictated with voice recognition software. Similar sounding words can inadvertently be transcribed and this note may contain transcription errors which may not have been corrected upon publication of note.**

## 2014-01-11 ENCOUNTER — Encounter (HOSPITAL_COMMUNITY): Payer: Self-pay | Admitting: *Deleted

## 2014-01-11 ENCOUNTER — Encounter (HOSPITAL_COMMUNITY)
Admission: EM | Disposition: A | Discharge status: Home / Self Care | Payer: Self-pay | Source: Home / Self Care | Attending: Internal Medicine

## 2014-01-11 DIAGNOSIS — K219 Gastro-esophageal reflux disease without esophagitis: Secondary | ICD-10-CM | POA: Diagnosis not present

## 2014-01-11 DIAGNOSIS — I5033 Acute on chronic diastolic (congestive) heart failure: Secondary | ICD-10-CM | POA: Diagnosis not present

## 2014-01-11 DIAGNOSIS — D509 Iron deficiency anemia, unspecified: Secondary | ICD-10-CM | POA: Diagnosis not present

## 2014-01-11 DIAGNOSIS — K921 Melena: Secondary | ICD-10-CM | POA: Diagnosis not present

## 2014-01-11 DIAGNOSIS — N179 Acute kidney failure, unspecified: Secondary | ICD-10-CM | POA: Diagnosis not present

## 2014-01-11 HISTORY — PX: ESOPHAGOGASTRODUODENOSCOPY: SHX5428

## 2014-01-11 LAB — TYPE AND SCREEN
ABO/RH(D): A POS
ANTIBODY SCREEN: POSITIVE
DAT, IgG: NEGATIVE
DONOR AG TYPE: NEGATIVE
Donor AG Type: NEGATIVE
Unit division: 0
Unit division: 0

## 2014-01-11 LAB — GLUCOSE, CAPILLARY
GLUCOSE-CAPILLARY: 132 mg/dL — AB (ref 70–99)
Glucose-Capillary: 155 mg/dL — ABNORMAL HIGH (ref 70–99)
Glucose-Capillary: 153 mg/dL — ABNORMAL HIGH (ref 70–99)
Glucose-Capillary: 138 mg/dL — ABNORMAL HIGH (ref 70–99)

## 2014-01-11 LAB — HEMOGLOBIN AND HEMATOCRIT, BLOOD
HCT: 28.7 % — ABNORMAL LOW (ref 39.0–52.0)
HEMOGLOBIN: 8.8 g/dL — AB (ref 13.0–17.0)

## 2014-01-11 LAB — BASIC METABOLIC PANEL
BUN: 95 mg/dL — AB (ref 6–23)
CO2: 30 mEq/L (ref 19–32)
CREATININE: 2.12 mg/dL — AB (ref 0.50–1.35)
Calcium: 10 mg/dL (ref 8.4–10.5)
Chloride: 89 mEq/L — ABNORMAL LOW (ref 96–112)
GFR, EST AFRICAN AMERICAN: 36 mL/min — AB (ref >90)
GFR, EST NON AFRICAN AMERICAN: 31 mL/min — AB (ref >90)
GLUCOSE: 161 mg/dL — AB (ref 70–99)
Potassium: 3.3 mEq/L — ABNORMAL LOW (ref 3.7–5.3)
Sodium: 135 mEq/L — ABNORMAL LOW (ref 137–147)

## 2014-01-11 LAB — OSMOLALITY, URINE: Osmolality, Ur: 322 mOsm/kg — ABNORMAL LOW (ref 390–1090)

## 2014-01-11 LAB — CBC
HEMATOCRIT: 28.4 % — AB (ref 39.0–52.0)
Hemoglobin: 8.7 g/dL — ABNORMAL LOW (ref 13.0–17.0)
MCH: 26.4 pg (ref 26.0–34.0)
MCHC: 30.6 g/dL (ref 30.0–36.0)
MCV: 86.3 fL (ref 78.0–100.0)
Platelets: 168 10*3/uL (ref 150–400)
RBC: 3.29 MIL/uL — ABNORMAL LOW (ref 4.22–5.81)
RDW: 16.5 % — AB (ref 11.5–15.5)
WBC: 8.6 10*3/uL (ref 4.0–10.5)

## 2014-01-11 SURGERY — EGD (ESOPHAGOGASTRODUODENOSCOPY)
Anesthesia: Moderate Sedation | Laterality: Left

## 2014-01-11 MED ORDER — FENTANYL CITRATE 0.05 MG/ML IJ SOLN
INTRAMUSCULAR | Status: AC
  Filled 2014-01-11: qty 2

## 2014-01-11 MED ORDER — FENTANYL CITRATE 0.05 MG/ML IJ SOLN
INTRAMUSCULAR | Status: DC | PRN
  Administered 2014-01-11: 12.5 ug via INTRAVENOUS
  Administered 2014-01-11: 25 ug via INTRAVENOUS
  Administered 2014-01-11: 12.5 ug via INTRAVENOUS

## 2014-01-11 MED ORDER — LIDOCAINE VISCOUS 2 % MT SOLN
OROMUCOSAL | Status: AC
  Filled 2014-01-11: qty 15

## 2014-01-11 MED ORDER — POTASSIUM CHLORIDE CRYS ER 20 MEQ PO TBCR
40.0000 meq | EXTENDED_RELEASE_TABLET | ORAL | Status: AC
  Administered 2014-01-11 (×2): 40 meq via ORAL
  Filled 2014-01-11 (×2): qty 2

## 2014-01-11 MED ORDER — SODIUM CHLORIDE 0.9 % IV SOLN
500.0000 mg | Freq: Once | INTRAVENOUS | Status: AC
  Administered 2014-01-11: 500 mg via INTRAVENOUS
  Filled 2014-01-11 (×2): qty 10

## 2014-01-11 MED ORDER — IRON DEXTRAN 50 MG/ML IJ SOLN
25.0000 mg | Freq: Once | INTRAMUSCULAR | Status: AC
  Administered 2014-01-11: 25 mg via INTRAVENOUS
  Filled 2014-01-11: qty 0.5

## 2014-01-11 MED ORDER — LIDOCAINE VISCOUS 2 % MT SOLN
OROMUCOSAL | Status: DC | PRN
  Administered 2014-01-11: 20 mL via OROMUCOSAL

## 2014-01-11 MED ORDER — MIDAZOLAM HCL 10 MG/2ML IJ SOLN
INTRAMUSCULAR | Status: DC | PRN
  Administered 2014-01-11 (×2): 1 mg via INTRAVENOUS
  Administered 2014-01-11: 2 mg via INTRAVENOUS

## 2014-01-11 MED ORDER — MIDAZOLAM HCL 5 MG/ML IJ SOLN
INTRAMUSCULAR | Status: AC
  Filled 2014-01-11: qty 2

## 2014-01-11 MED ORDER — DILTIAZEM HCL 90 MG PO TABS
90.0000 mg | ORAL_TABLET | Freq: Three times a day (TID) | ORAL | Status: DC
  Administered 2014-01-11 – 2014-01-14 (×9): 90 mg via ORAL
  Filled 2014-01-11 (×14): qty 1

## 2014-01-11 MED ORDER — FUROSEMIDE 10 MG/ML IJ SOLN
40.0000 mg | INTRAMUSCULAR | Status: AC
  Administered 2014-01-11: 40 mg via INTRAVENOUS

## 2014-01-11 NOTE — Progress Notes (Addendum)
Patient Demographics  [name], is a 68 y.o. male, DOB - 1946-01-24, CLE:[id number]  Admit date - 01/10/2014   Admitting Physician Thurnell Lose, MD  Outpatient Primary MD for the patient is Matthew Melter, MD  LOS - 1   Chief Complaint  Patient presents with  . Anemia        Subjective:   [name] today has, No headache, No chest pain, No abdominal pain - No Nausea, No new weakness tingling or numbness, No Cough - SOB. Feels less weak this morning and overall much better.    Assessment & Plan    1. Acute on chronic iron deficiency anemia due to upper GI bleed - associated with his history of gastritis and small bowel AVMs, status post 2 units of packed RBC transfusion on 01/10/2014 with stable H&H response, anemia panel suggestive of iron deficiency and will receive IV iron, IV PPI to continue we'll switch to twice a day shots, GI seen due for EGD on 01/11/2014. No signs of ongoing acute profuse bleeding.    2.NASH with portal hypertension and thrombocytopenia. No acute issues supportive care. Stable INR, no esophageal varices on last EGD done a few months ago.    3. Chronic diastolic CHF. Mild fluid overload, no shortness of breath, home dose diuretic continued, no extra IV fluids. If BMP better mild give him an extra dose of IV Lasix the    4. History of CAD and atrial fibrillation. No acute issues, no chest pain, currently in atrial fibrillation, for now aspirin will be held due to #1 above. Continue Cardizem for rate control.    5. Acute renal failure and chronic kidney disease stage III. Baseline creatinine is around 1.5, acute renal failure due to #1 above, status post transfusion, continue to hold Cozaar, monitor BMP closely.     6. DM type II.  reduce NPH to 15  units twice a day instead of 25, low-dose sliding scale insulin with meals. Hold Glucophage.    Lab Results  Component Value Date   HGBA1C 5.8* 01/10/2014    CBG (last 3)   Recent Labs  01/10/14 2141  GLUCAP 152*     7. Dyslipidemia. Continue home dose statin.      8. Hypertension. Currently blood pressure on the softer side, will hold Cozaar, continue Cardizem and monitor.     9. Low potassium. Replace and recheck.      Code Status: Full  Family Communication: wife bedside  Disposition Plan: Home   Procedures due for EGD 01/11/2014   Consults GI   Medications  Scheduled Meds: . atorvastatin  20 mg Oral QHS  . diltiazem  90 mg Oral 3 times per day  . insulin aspart  0-9 Units Subcutaneous TID WC  . insulin NPH Human  15 Units Subcutaneous BID AC & HS  . iron dextran (INFED/DEXFERRUM) infusion  25 mg Intravenous Once   Followed by  . iron dextran (INFED/DEXFERRUM) infusion  500 mg Intravenous Once  . [START ON 01/13/2014] metolazone  2.5 mg Oral Once per day on Mon Thu  . metoprolol succinate  100 mg Oral BID  . pantoprazole (PROTONIX) IV  80 mg Intravenous Once  . potassium chloride  40 mEq Oral Q4H  .  sodium chloride  3 mL Intravenous Q12H  . torsemide  100 mg Oral BID  . vitamin C  500 mg Oral BID   Continuous Infusions: . sodium chloride 20 mL/hr at 01/10/14 2206  . pantoprozole (PROTONIX) infusion 8 mg/hr (01/11/14 5188)   PRN Meds:.guaiFENesin-dextromethorphan, HYDROcodone-acetaminophen, ondansetron (ZOFRAN) IV, ondansetron, polyethylene glycol  DVT Prophylaxis    SCDs    Lab Results  Component Value Date   PLT 168 01/11/2014    Antibiotics     Anti-infectives   None          Objective:   Filed Vitals:   01/11/14 0431 01/11/14 0500 01/11/14 0807 01/11/14 0824  BP: 94/49  103/43   Pulse: 91  118 97  Temp: 98.1 F (36.7 C)  98 F (36.7 C)   TempSrc: Oral  Oral   Resp: 20  18 17   Height:      Weight:  120.9 kg (266 lb  8.6 oz)    SpO2: 100%  100% 98%    Wt Readings from Last 3 Encounters:  01/11/14 120.9 kg (266 lb 8.6 oz)  01/11/14 120.9 kg (266 lb 8.6 oz)  01/01/14 120.657 kg (266 lb)     Intake/Output Summary (Last 24 hours) at 01/11/14 0833 Last data filed at 01/11/14 4166  Gross per 24 hour  Intake 1068.58 ml  Output   3550 ml  Net -2481.42 ml     Physical Exam  Awake Alert, Oriented X 3, No new F.N deficits, Normal affect [place].AT,PERRAL Supple Neck,No JVD, No cervical lymphadenopathy appriciated.  Symmetrical Chest wall movement, Good air movement bilaterally, CTAB RRR,No Gallops,Rubs or new Murmurs, No Parasternal Heave +ve B.Sounds, Abd Soft but mildly distended, No tenderness, No organomegaly appriciated, No rebound - guarding or rigidity. No Cyanosis, Clubbing or edema, No new Rash or bruise      Data Review   Micro Results Recent Results (from the past 240 hour(s))  MRSA PCR SCREENING     Status: None   Collection Time    01/10/14  7:00 PM      Result Value Ref Range Status   MRSA by PCR NEGATIVE  NEGATIVE Final   Comment:            The GeneXpert MRSA Assay (FDA     approved for NASAL specimens     only), is one component of a     comprehensive MRSA colonization     surveillance program. It is not     intended to diagnose MRSA     infection nor to guide or     monitor treatment for     MRSA infections.    Radiology Reports No results found.  CBC  Recent Labs Lab 01/10/14 0901 01/10/14 1720 01/11/14 0705  WBC 8.2 8.8 8.6  HGB 7.5* 6.7* 8.7*  HCT 24.6* 22.1* 28.4*  PLT 157 176 168  MCV 88.2 87.0 86.3  MCH 26.9 26.4 26.4  MCHC 30.5 30.3 30.6  RDW 16.9* 16.9* 16.5*  LYMPHSABS  --  1.3  --   MONOABS  --  0.9  --   EOSABS  --  0.2  --   BASOSABS  --  0.1  --     Chemistries   Recent Labs Lab 01/10/14 0847 01/10/14 1720  NA 136* 134*  K 3.0* 3.4*  CL 90* 88*  CO2 28 26  GLUCOSE 167* 160*  BUN 96* 97*  CREATININE 1.97* 2.41*  CALCIUM 9.9 9.7    ------------------------------------------------------------------------------------------------------------------ estimated  creatinine clearance is 38.6 ml/min (by C-G formula based on Cr of 2.41). ------------------------------------------------------------------------------------------------------------------  Recent Labs  01/10/14 1740  HGBA1C 5.8*   ------------------------------------------------------------------------------------------------------------------ No results found for this basename: CHOL, HDL, LDLCALC, TRIG, CHOLHDL, LDLDIRECT,  in the last 72 hours ------------------------------------------------------------------------------------------------------------------ No results found for this basename: TSH, T4TOTAL, FREET3, T3FREE, THYROIDAB,  in the last 72 hours ------------------------------------------------------------------------------------------------------------------  Recent Labs  01/10/14 1800  VITAMINB12 782  FOLATE >20.0  FERRITIN 12*  TIBC 515*  IRON 32*  RETICCTPCT 9.3*    Coagulation profile  Recent Labs Lab 01/10/14 1740  INR 1.05    No results found for this basename: DDIMER,  in the last 72 hours  Cardiac Enzymes No results found for this basename: CK, CKMB, TROPONINI, MYOGLOBIN,  in the last 168 hours ------------------------------------------------------------------------------------------------------------------ No components found with this basename: POCBNP,      Time Spent in minutes   35   SINGH,PRASHANT K M.D on 01/11/2014 at 8:33 AM  Between 7am to 7pm - Pager - [phone]  After 7pm go to [email or web address] - password [email or web address]  And look for the night coverage person covering for me after hours  [facility] Office  [phone]   **Disclaimer: This note may have been dictated with voice recognition software. Similar sounding words can inadvertently be transcribed and this note may contain transcription  errors which may not have been corrected upon publication of note.**

## 2014-01-11 NOTE — Op Note (Signed)
[facility] [place], [id number]   OPERATIVE PROCEDURE REPORT  PATIENT :[name], [name]  MR#: [id number] BIRTHDATE :12/21/1945 GENDER: Male ENDOSCOPIST: Edmonia James, MD ASSISTANT:   William Dalton, technician Estelle June, RN PROCEDURE DATE: February 10, 2014 PRE-PROCEDURE PREPERATION: Patient fasted for 4 hours prior to procedure. PRE-PROCEDURE PHYSICAL: Patient has unstable vital signs with tachycardia and atrial fibrillation and after he was given Lasix 40 mg IV and cardiac clearance was procured from Dr. Percival Spanish we proceeded with the EGD. Neck is supple.  There is no JVD, thyromegaly or LAD.  Chest clear to auscultation.  S1 and S2 regular.  Abdomen soft, morbidly obese, non-distended, non-tender with NABS. PROCEDURE:     EGD, diagnostic ASA CLASS:     Class IV INDICATIONS:     Melena, acute post hemorrhagic anemia, GERD. MEDICATIONS:     Fentanyl 50 mcg and Versed 4 mg IV TOPICAL ANESTHETIC:   Viscous Xylocaine-20 cc PO.  DESCRIPTION OF PROCEDURE: After the risks benefits and alternatives of the procedure were thoroughly explained, informed consent was obtained.  The Pentax Gastroscope [id number]  was introduced through the mouth and advanced to the second portion of the duodenum , without limitations. The instrument was slowly withdrawn as the mucosa was fully examined.   The esophagus, GEJ and the proximal small bowel appeared normal. There were no ulcers, erosions or masses noted. Retroflexed views revealed no abnormalities except for a single gastric polyp. Antral erythema was noted ?gastritis vs GAVE-pattern not typical for GAVE. The scope was then withdrawn from the patient and the procedure terminated. The patient tolerated the procedure without immediate complications.  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 No fresh or old heme in the upper GI tract. Otherwise, normal  esophagogastroduodenoscopy.  RECOMMENDATIONS:     1.  Anti-reflux regimen to be followed. 2.  Avoid NSAIDS for now. 3.  Continue current medications. 4. Clear liquid diet for now. 5. Serial CBC's for now. 6. May need a colonoscopy for further evaluation.  REPEAT EXAM:  None planed for now.  DISCHARGE INSTRUCTIONS: discharge instructions given.  _______________________________ eSigned:  Dr. Edmonia James, MD 10-Feb-2014 11:16 AM   CPT CODES:     56387, EGD  DIAGNOSIS CODES:     792.1 Melena 280.9 Iron Deficiency Anemia, GERD 530.81   CC: THP  PATIENT NAME:  [name] MR#: [id number]

## 2014-01-11 NOTE — OR Nursing (Signed)
Report called and patient transported back to unit for closer observation.

## 2014-01-11 NOTE — Consult Note (Signed)
Reason for Consult: pauses,    Referring Physician: Dr. Collene Mares PCP: [name], MD Primary Cardiologist:Dr. Cooper/ Bensimhon  [place] [name] is an 68 y.o. male.    Chief Complaint: short of breath, atrial fib   HPI:  68 y.o. male, CAD status post CABG (last LHC in 8/13 showed patent grafts.) AVR was in 10/13. Last echo in 8/14 showed EF 55-60%., history of small bowel AVMs and gastritis, NASH with portal hypertension but recent EGD did not show any varesis, EGD done a few months ago, portal hypertension related thrombocytopenia, dyslipidemia, COPD, GERD, chronic diastolic heart failure EF 55%, status post aortic valve replacement-bovine valve, atrial fibrillation not on anticoagulation due to recurrent GI bleeds who was feeling weak for the last few days and has noticed that his blood has become increasingly dark, also mild nausea without any emesis, went to PCP office yesterday morning when he was found to be anemic and then sent to the ER.   In the ER he was found to be anemic again, baseline hemoglobin runs around 9.5 currently he is on 6.7, he strongly Hemoccult positive in the ER.  He was admitted with probable GI bleed related iron deficiency anemia acute on chronic.   Recent Rt heart Cath 01/01/14:  Findings:  RA = 14  RV = 44/4/11  PA = 50/23 (33)  PCW = 20 v = 35  Fick cardiac output/index = 6.0/2.6  PVR = 2.2 WU  FA sat = 98%  PA sat = 59%, 60%  Assessment:  1. Elevated biventricular pressures (R>L). Significant v waves in PCWP tracing suggestive of mitral regurgitation  2. Normal cardiac output  Pt rec'd 2 units PRBCs last night. Today is in for EGD for bleed and is short of breath, lungs with decreased breath sounds and his permanent atrial fib -I have not seen long pauses HR 123.  On June 10 his EKG is similar with similar rate. He is not anticoagulated due to Hx of GI Bleed. He has just been given 40 mg IV lasix.   Past Medical History  Diagnosis Date   . Overweight(278.02)   . CAD (coronary artery disease)     a. s/p CABG 2004;  b. [place] 10/13:  LHC 8/13: Free radial to obtuse marginal patent, SVG-diagonal patent, LIMA-LAD patent, EF 65-70%, mean aortic valve gradient 42  . Atrial fibrillation     Permanent; off of Coumadin for now due to GI bleed  . DM2 (diabetes mellitus, type 2)   . Insomnia   . Carotid bruit 06/14/2011    a. pre-AVR dopplers 10/13: no sig ICA stenosis  . Hypertension   . Chronic diastolic heart failure   . GERD (gastroesophageal reflux disease)   . COPD (chronic obstructive pulmonary disease)   . Iron deficiency anemia     Requiring intravenous iron  . H/O hiatal hernia   . AVM (arteriovenous malformation)     Recurrent GI bleeding requiring multiple transfusions  . Hyperlipidemia   . Thrombocytopenia   . Ascites     status post paracentesis with removal of 3.4 L of ascitic fluid  . Osteoarthritis   . Mediastinal adenopathy 09/22/2011  . Cirrhosis   . Iron deficiency anemia   . Mediastinal adenopathy 09/22/2011  . Aortic stenosis 03/08/2012    a.  s/p tissue AVR 10/13 with Dr. [name];   b. Echo 10/13: mod LVH, EF 55-60%, tissue AVR not well seen, no leak, gradient not too  high (mean 92mHg), MAC, mild MR, mild LAE, PASP 38  . OSA (obstructive sleep apnea) 1999    DOES NOT USE CPAP  . Blood transfusion without reported diagnosis     Past Surgical History  Procedure Laterality Date  . Coronary artery bypass graft   10/15/2002     SRevonda Standard HRoxan Hockey M.D.     . Carpal tunnel release    10/08/2003  . Lipoma surgery    . Hernia repair    . Other surgical history  [facility]/26/2011    [facility] , revealing "three-four AVMs."   . Tee without cardioversion  03/07/2012    Procedure: TRANSESOPHAGEAL ECHOCARDIOGRAM (TEE);  Surgeon: PThayer Headings MD;  [facility];  Service: Cardiovascular;  Laterality: N/A;  . Coronary angioplasty with stent placement  01/19/2005    drug eluting stent to  high grade ostial stenosis of radial artery graft to OM  . Aortic valve replacement  04/25/2012    Procedure: AORTIC VALVE REPLACEMENT (AVR);  Surgeon: SMelrose Nakayama MD;  [facility]  Service: Open Heart Surgery;  Laterality: N/A;  . Esophagogastroduodenoscopy N/A 07/31/2013    Procedure: ESOPHAGOGASTRODUODENOSCOPY (EGD);  Surgeon: DMilus Banister MD;  [facility]  Service: Endoscopy;  Laterality: N/A;    Family History  Problem Relation Age of Onset  . Coronary artery disease      FAMILY HISTORY  . Hypertension Father   . Diabetes Father   . Heart disease Father   . COPD Sister   . Cancer Maternal Aunt     Breast cancer   . Cancer Maternal Aunt     Breast cancer   . Diabetes Son   . Cancer Daughter     Cervical cancer   Social History:  reports that he quit smoking about 13 years ago. His smoking use included Cigarettes. He has a 30 pack-year smoking history. He has never used smokeless tobacco. He reports that he drinks about .6 ounces of alcohol per week. He reports that he does not use illicit drugs.  Allergies:  Allergies  Allergen Reactions  . Codeine Other (See Comments)    Hurting in chest  . Diltiazem Hcl Itching  . Niacin Other (See Comments)    Hot flashes    Medications Prior to Admission  Medication Sig Dispense Refill  . aspirin EC 325 MG tablet Take 1 tablet (325 mg total) by mouth daily.  30 tablet  6  . atorvastatin (LIPITOR) 20 MG tablet Take 20 mg by mouth every morning.       . ferrous gluconate (FERGON) 325 MG tablet Take 325 mg by mouth 2 (two) times daily.       . insulin NPH Human (HUMULIN N,NOVOLIN N) 100 UNIT/ML injection Inject 20-25 Units into the skin 2 (two) times daily before a meal.      . insulin regular (NOVOLIN R,HUMULIN R) 100 units/mL injection Inject 15 Units into the skin 2 (two) times daily before a meal.       . losartan (COZAAR) 50 MG tablet Take 1 tablet (50 mg total) by mouth daily.  30 tablet  3  .  metFORMIN (GLUCOPHAGE) 1000 MG tablet Take 1,000 mg by mouth 2 (two) times daily with a meal.       . metolazone (ZAROXOLYN) 2.5 MG tablet Take 2.5 mg by mouth every Monday, Wednesday, and Friday.      . metoprolol succinate (TOPROL-XL) 100 MG 24 hr tablet Take 1 tablet (100  mg total) by mouth 2 (two) times daily. Take with or immediately following a meal.  60 tablet  6  . Multiple Vitamins-Minerals ([employer] SPECTRAVITE ADULT 50+ PO) Take 1 tablet by mouth daily.      [name] omeprazole (PRILOSEC) 20 MG capsule Take 20 mg by mouth daily.       . potassium chloride (K-DUR,KLOR-CON) 10 MEQ tablet Take 40 mEq by mouth 2 (two) times daily.      [name] torsemide (DEMADEX) 20 MG tablet Take 5 tablets (100 mg total) by mouth 2 (two) times daily.  210 tablet  3  . verapamil (CALAN-SR) 240 MG CR tablet Take 1 tablet (240 mg total) by mouth at bedtime.  30 tablet  6  . vitamin C (ASCORBIC ACID) 500 MG tablet Take 500 mg by mouth 2 (two) times daily.        Results for orders placed during the hospital encounter of 01/10/14 (from the past 48 hour(s))  BASIC METABOLIC PANEL     Status: Abnormal   Collection Time    01/10/14  5:20 PM      Result Value Ref Range   Sodium 134 (*) 137 - 147 mEq/L   Potassium 3.4 (*) 3.7 - 5.3 mEq/L   Chloride 88 (*) 96 - 112 mEq/L   CO2 26  19 - 32 mEq/L   Glucose, Bld 160 (*) 70 - 99 mg/dL   BUN 97 (*) 6 - 23 mg/dL   Creatinine, Ser 2.41 (*) 0.50 - 1.35 mg/dL   Calcium 9.7  8.4 - 10.5 mg/dL   GFR calc non Af Amer 27 (*) >90 mL/min   GFR calc Af Amer 31 (*) >90 mL/min   Comment: (NOTE)     The eGFR has been calculated using the CKD EPI equation.     This calculation has not been validated in all clinical situations.     eGFR's persistently <90 mL/min signify possible Chronic Kidney     Disease.  CBC WITH DIFFERENTIAL     Status: Abnormal   Collection Time    01/10/14  5:20 PM      Result Value Ref Range   WBC 8.8  4.0 - 10.5 K/uL   RBC 2.54 (*) 4.22 - 5.81 MIL/uL   Hemoglobin  6.7 (*) 13.0 - 17.0 g/dL   Comment: REPEATED TO VERIFY     CRITICAL RESULT CALLED TO, READ BACK BY AND VERIFIED WITH:     J GAGE RN 1812 01/10/14 A BROWNING   HCT 22.1 (*) 39.0 - 52.0 %   MCV 87.0  78.0 - 100.0 fL   MCH 26.4  26.0 - 34.0 pg   MCHC 30.3  30.0 - 36.0 g/dL   RDW 16.9 (*) 11.5 - 15.5 %   Platelets 176  150 - 400 K/uL   Neutrophils Relative % 73  43 - 77 %   Neutro Abs 6.4  1.7 - 7.7 K/uL   Lymphocytes Relative 15  12 - 46 %   Lymphs Abs 1.3  0.7 - 4.0 K/uL   Monocytes Relative 10  3 - 12 %   Monocytes Absolute 0.9  0.1 - 1.0 K/uL   Eosinophils Relative 2  0 - 5 %   Eosinophils Absolute 0.2  0.0 - 0.7 K/uL   Basophils Relative 1  0 - 1 %   Basophils Absolute 0.1  0.0 - 0.1 K/uL  TYPE AND SCREEN     Status: None   Collection Time  01/10/14  5:40 PM      Result Value Ref Range   ABO/RH(D) A POS     Antibody Screen POS     Sample Expiration 01/13/2014     DAT, IgG NEG     Antibody Identification ANTI E     Unit Number [id number]     Blood Component Type RED CELLS,LR     Unit division 00     Status of Unit ISSUED     Transfusion Status OK TO TRANSFUSE     Crossmatch Result COMPATIBLE     Donor AG Type NEGATIVE FOR E ANTIGEN NEGATIVE FOR KELL ANTIGEN     Unit Number [id number]     Blood Component Type RED CELLS,LR     Unit division 00     Status of Unit ISSUED     Transfusion Status OK TO TRANSFUSE     Crossmatch Result COMPATIBLE     Donor AG Type NEGATIVE FOR E ANTIGEN NEGATIVE FOR KELL ANTIGEN    PREPARE RBC (CROSSMATCH)     Status: None   Collection Time    01/10/14  5:40 PM      Result Value Ref Range   Order Confirmation ORDER PROCESSED BY BLOOD BANK    PROTIME-INR     Status: None   Collection Time    01/10/14  5:40 PM      Result Value Ref Range   Prothrombin Time 13.5  11.6 - 15.2 seconds   INR 1.05  0.00 - 1.49  HEMOGLOBIN A1C     Status: Abnormal   Collection Time    01/10/14  5:40 PM      Result Value Ref Range   Hemoglobin A1C 5.8  (*) <5.7 %   Comment: (NOTE)                                                                               According to the ADA Clinical Practice Recommendations for 2011, when     HbA1c is used as a screening test:      >=6.5%   Diagnostic of Diabetes Mellitus               (if abnormal result is confirmed)     5.7-6.4%   Increased risk of developing Diabetes Mellitus     References:Diagnosis and Classification of Diabetes Mellitus,Diabetes     HYQM,5784,69(GEXBM 1):S62-S69 and Standards of Medical Care in             Diabetes - 2011,Diabetes Care,2011,34 (Suppl 1):S11-S61.   Mean Plasma Glucose 120 (*) <117 mg/dL   Comment: Performed at [facility]     Status: None   Collection Time    01/10/14  6:00 PM      Result Value Ref Range   Vitamin B-12 782  211 - 911 pg/mL   Comment: Performed at [facility]     Status: None   Collection Time    01/10/14  6:00 PM      Result Value Ref Range   Folate >20.0     Comment: (NOTE)     Reference Ranges  Deficient:       0.4 - 3.3 ng/mL            Indeterminate:   3.4 - 5.4 ng/mL            Normal:              > 5.4 ng/mL     Performed at [facility] TIBC     Status: Abnormal   Collection Time    01/10/14  6:00 PM      Result Value Ref Range   Iron 32 (*) 42 - 135 ug/dL   TIBC 515 (*) 215 - 435 ug/dL   Saturation Ratios 6 (*) 20 - 55 %   UIBC 483 (*) 125 - 400 ug/dL   Comment: Performed at [facility]     Status: Abnormal   Collection Time    01/10/14  6:00 PM      Result Value Ref Range   Ferritin 12 (*) 22 - 322 ng/mL   Comment: Performed at [facility]     Status: Abnormal   Collection Time    01/10/14  6:00 PM      Result Value Ref Range   Retic Ct Pct 9.3 (*) 0.4 - 3.1 %   RBC. 2.45 (*) 4.22 - 5.81 MIL/uL   Retic Count, Manual 227.9 (*) 19.0 - 186.0 K/uL  OSMOLALITY     Status: Abnormal   Collection Time     01/10/14  6:00 PM      Result Value Ref Range   Osmolality 310 (*) 275 - 300 mOsm/kg   Comment: Performed at [place], ED     Status: Abnormal   Collection Time    01/10/14  6:14 PM      Result Value Ref Range   Fecal Occult Bld POSITIVE (*) NEGATIVE  MRSA PCR SCREENING     Status: None   Collection Time    01/10/14  7:00 PM      Result Value Ref Range   MRSA by PCR NEGATIVE  NEGATIVE   Comment:            The GeneXpert MRSA Assay (FDA     approved for NASAL specimens     only), is one component of a     comprehensive MRSA colonization     surveillance program. It is not     intended to diagnose MRSA     infection nor to guide or     monitor treatment for     MRSA infections.  GLUCOSE, CAPILLARY     Status: Abnormal   Collection Time    01/10/14  9:41 PM      Result Value Ref Range   Glucose-Capillary 152 (*) 70 - 99 mg/dL   Comment 1 Documented in Chart     Comment 2 Notify RN    CREATININE, URINE, RANDOM     Status: None   Collection Time    01/10/14 10:13 PM      Result Value Ref Range   Creatinine, Urine 64.66    OSMOLALITY, URINE     Status: Abnormal   Collection Time    01/10/14 10:13 PM      Result Value Ref Range   Osmolality, Ur 322 (*) 390 - 1090 mOsm/kg   Comment: Performed at [facility]  SODIUM, URINE, RANDOM     Status: None  Collection Time    01/10/14 10:13 PM      Result Value Ref Range   Sodium, Ur 61    BASIC METABOLIC PANEL     Status: Abnormal   Collection Time    01/11/14  7:05 AM      Result Value Ref Range   Sodium 135 (*) 137 - 147 mEq/L   Potassium 3.3 (*) 3.7 - 5.3 mEq/L   Chloride 89 (*) 96 - 112 mEq/L   CO2 30  19 - 32 mEq/L   Glucose, Bld 161 (*) 70 - 99 mg/dL   BUN 95 (*) 6 - 23 mg/dL   Creatinine, Ser 2.12 (*) 0.50 - 1.35 mg/dL   Calcium 10.0  8.4 - 10.5 mg/dL   GFR calc non Af Amer 31 (*) >90 mL/min   GFR calc Af Amer 36 (*) >90 mL/min   Comment: (NOTE)     The eGFR has been  calculated using the CKD EPI equation.     This calculation has not been validated in all clinical situations.     eGFR's persistently <90 mL/min signify possible Chronic Kidney     Disease.  CBC     Status: Abnormal   Collection Time    01/11/14  7:05 AM      Result Value Ref Range   WBC 8.6  4.0 - 10.5 K/uL   RBC 3.29 (*) 4.22 - 5.81 MIL/uL   Hemoglobin 8.7 (*) 13.0 - 17.0 g/dL   Comment: POST TRANSFUSION SPECIMEN   HCT 28.4 (*) 39.0 - 52.0 %   MCV 86.3  78.0 - 100.0 fL   MCH 26.4  26.0 - 34.0 pg   MCHC 30.6  30.0 - 36.0 g/dL   RDW 16.5 (*) 11.5 - 15.5 %   Platelets 168  150 - 400 K/uL  GLUCOSE, CAPILLARY     Status: Abnormal   Collection Time    01/11/14  8:06 AM      Result Value Ref Range   Glucose-Capillary 155 (*) 70 - 99 mg/dL   No results found.  ROS: General:no colds or fevers,  Filed Weights   01/10/14 1711 01/10/14 1855 01/11/14 0500  Weight: 265 lb (120.203 kg) 264 lb 15.9 oz (120.2 kg) 266 lb 8.6 oz (120.9 kg)  weight 12/25/13 was 269 Skin:no rashes or ulcers HEENT:no blurred vision, no congestion CV:see HPI PUL:see HPI GI:no diarrhea constipation ++ melena, no indigestion GU:no hematuria, no dysuria MS:no joint pain, no claudication Neuro:no syncope, no lightheadedness Endo:no diabetes, no thyroid disease   Blood pressure 117/60, pulse 97, temperature 98 F (36.7 C), temperature source Oral, resp. rate 5, height 5' 8"  (1.727 m), weight 266 lb 8.6 oz (120.9 kg), SpO2 100.00%. PE: General:Pleasant affect, + SOB, decreasesed sp02 and increased HR Skin:Warm and dry, brisk capillary refill HEENT:normocephalic, sclera clear, mucus membranes moist Neck:supple, mild JVD, no bruits  Heart:S1S2 irreg irreg with systolic murmur, no gallup, rub or click Lungs:diminished with fine rales in the bases,  No rhonchi, or wheezes BTD:VVOHY, soft, non tender, + BS, do not palpate liver spleen or masses Ext:tr lower ext edema, 1+ pedal pulses, 2+ radial pulses Neuro:alert  and oriented, MAE, follows commands, + facial symmetry    Assessment/Plan Principal Problem:   GI bleeding- rec'd 2 units PRBCs last pm now with volume overload with desat.  Has rec'd 40 mg IV lasix.   MD to see. Active Problems:   HYPERLIPIDEMIA-MIXED   ANEMIA   Small bowel arteriovenous malformation  Iron deficiency anemia   Hypertension   Permanent atrial fibrillation- with RVR, was elevated on 01/01/14 as well and on 12/25/13   Chronic diastolic CHF (congestive heart failure)   Type 2 diabetes mellitus   Thrombocytopenia due to hypersplenism   S/P CABG x 3   Unspecified gastritis and gastroduodenitis without mention of hemorrhage   CKD (chronic kidney disease)   ARF (acute renal failure)  Hx sleep apnea   [facility] R  Nurse Practitioner Certified [place] Pager [phone] or after 5pm or weekends call [phone] 01/11/2014, 9:13 AM  History and all data above reviewed.  He was comfortable by his report when he came to the endo suite.  However, he had reported shallow respirations and appeared to be volume overloaded.  He has been treated with IV Lasix and feels much better.  Currently he denies any SOB.  There was reported bradycardia with his chronic afib but I don't have these print outs.  He has had no syncope but has been weak for a couple of weeks.  Patient examined.  I agree with the findings as above.  The patient exam reveals MQT:TCNGFREVQ, systolic murmur radiating out the outflow tract  ,  Lungs: Clear, no crackles  ,  Abd: Positive bowel sounds, no rebound no guarding, Ext Mild edema  .  All available labs, radiology testing, previous records reviewed. Agree with documented assessment and plan. CHF:  Probable volume overload after transfusion.  Now improved after diuresis.  OK to proceed with the procedure.  We will follow for volume management.  Atrial fib:  Continue current meds with telemetry.   Jeneen Rinks Ginger Leeth  10:16 AM  01/11/2014

## 2014-01-11 NOTE — Progress Notes (Signed)
Utilization review complete 

## 2014-01-12 ENCOUNTER — Inpatient Hospital Stay (HOSPITAL_COMMUNITY): Payer: Medicare Other

## 2014-01-12 DIAGNOSIS — I4891 Unspecified atrial fibrillation: Secondary | ICD-10-CM | POA: Diagnosis not present

## 2014-01-12 DIAGNOSIS — R195 Other fecal abnormalities: Secondary | ICD-10-CM | POA: Diagnosis not present

## 2014-01-12 DIAGNOSIS — M79609 Pain in unspecified limb: Secondary | ICD-10-CM | POA: Diagnosis not present

## 2014-01-12 DIAGNOSIS — D500 Iron deficiency anemia secondary to blood loss (chronic): Secondary | ICD-10-CM | POA: Diagnosis not present

## 2014-01-12 DIAGNOSIS — K7460 Unspecified cirrhosis of liver: Secondary | ICD-10-CM | POA: Diagnosis not present

## 2014-01-12 DIAGNOSIS — K219 Gastro-esophageal reflux disease without esophagitis: Secondary | ICD-10-CM | POA: Diagnosis not present

## 2014-01-12 DIAGNOSIS — I5033 Acute on chronic diastolic (congestive) heart failure: Secondary | ICD-10-CM | POA: Diagnosis not present

## 2014-01-12 DIAGNOSIS — N179 Acute kidney failure, unspecified: Secondary | ICD-10-CM | POA: Diagnosis not present

## 2014-01-12 LAB — BASIC METABOLIC PANEL
BUN: 88 mg/dL — AB (ref 6–23)
CHLORIDE: 90 meq/L — AB (ref 96–112)
CO2: 27 mEq/L (ref 19–32)
Calcium: 9.5 mg/dL (ref 8.4–10.5)
Creatinine, Ser: 2.01 mg/dL — ABNORMAL HIGH (ref 0.50–1.35)
GFR calc Af Amer: 38 mL/min — ABNORMAL LOW (ref >90)
GFR calc non Af Amer: 33 mL/min — ABNORMAL LOW (ref >90)
Glucose, Bld: 166 mg/dL — ABNORMAL HIGH (ref 70–99)
Potassium: 3 mEq/L — ABNORMAL LOW (ref 3.7–5.3)
Sodium: 135 mEq/L — ABNORMAL LOW (ref 137–147)

## 2014-01-12 LAB — GLUCOSE, CAPILLARY
GLUCOSE-CAPILLARY: 157 mg/dL — AB (ref 70–99)
GLUCOSE-CAPILLARY: 142 mg/dL — AB (ref 70–99)
GLUCOSE-CAPILLARY: 179 mg/dL — AB (ref 70–99)
Glucose-Capillary: 143 mg/dL — ABNORMAL HIGH (ref 70–99)
Glucose-Capillary: 129 mg/dL — ABNORMAL HIGH (ref 70–99)

## 2014-01-12 LAB — MAGNESIUM: Magnesium: 2.1 mg/dL (ref 1.5–2.5)

## 2014-01-12 LAB — HEMOGLOBIN AND HEMATOCRIT, BLOOD
HEMATOCRIT: 27.2 % — AB (ref 39.0–52.0)
HEMATOCRIT: 28.7 % — AB (ref 39.0–52.0)
Hemoglobin: 8.3 g/dL — ABNORMAL LOW (ref 13.0–17.0)
Hemoglobin: 8.8 g/dL — ABNORMAL LOW (ref 13.0–17.0)

## 2014-01-12 LAB — URIC ACID: Uric Acid, Serum: 21.1 mg/dL — ABNORMAL HIGH (ref 4.0–7.8)

## 2014-01-12 MED ORDER — METHYLPREDNISOLONE SODIUM SUCC 40 MG IJ SOLR: 40.0000 mg | Freq: Once | INTRAMUSCULAR | Status: DC

## 2014-01-12 MED ORDER — POTASSIUM CHLORIDE CRYS ER 20 MEQ PO TBCR
40.0000 meq | EXTENDED_RELEASE_TABLET | ORAL | Status: AC
  Administered 2014-01-12 (×2): 40 meq via ORAL
  Filled 2014-01-12 (×2): qty 2

## 2014-01-12 MED ORDER — PANTOPRAZOLE SODIUM 40 MG PO TBEC
40.0000 mg | DELAYED_RELEASE_TABLET | Freq: Two times a day (BID) | ORAL | Status: DC
  Administered 2014-01-12 – 2014-01-14 (×5): 40 mg via ORAL
  Filled 2014-01-12 (×5): qty 1

## 2014-01-12 NOTE — Progress Notes (Signed)
Cross cover LHC-GI Subjective: Since I last evaluated the patient, he seems to be doing better. He has not had any melena or hematochezia. No BM in the last couple of days. Wants to eat a regular meal. No abdominal pain, nausea or vomiting. As per my discussion with Dr. Candiss Norse, plans are to discharge him tomorrow if his hemoglobin remains stable.   Objective: Vital signs in last 24 hours: Temp:  [97.9 F (36.6 C)-98.8 F (37.1 C)] 98 F (36.7 C) (06/21 1211) Pulse Rate:  [49-132] 109 (06/21 1211) Resp:  [9-17] 16 (06/21 1211) BP: (48-109)/(38-82) 105/73 mmHg (06/21 1352) SpO2:  [96 %-99 %] 96 % (06/21 1211) Last BM Date: 01/10/14  Intake/Output from previous day: 06/20 0701 - 06/21 0700 In: 1225 [P.O.:600; I.V.:625] Out: 3775 [Urine:3775] Intake/Output this shift: Total I/O In: 531.3 [P.O.:480; I.V.:51.3] Out: -   General appearance: alert, cooperative, appears stated age, no distress, morbidly obese and pale Resp: clear to auscultation bilaterally Cardio: irregularly irregular rhythm GI: soft, morbidly obese, non-tender; bowel sounds normal; no masses,  no organomegaly  Lab Results:  Recent Labs  01/10/14 0901 01/10/14 1720 01/11/14 0705 01/11/14 1257 01/12/14 0055 01/12/14 1345  WBC 8.2 8.8 8.6  --   --   --   HGB 7.5* 6.7* 8.7* 8.8* 8.3* 8.8*  HCT 24.6* 22.1* 28.4* 28.7* 27.2* 28.7*  PLT 157 176 168  --   --   --    BMET  Recent Labs  01/10/14 1720 01/11/14 0705 01/12/14 0055  NA 134* 135* 135*  K 3.4* 3.3* 3.0*  CL 88* 89* 90*  CO2 26 30 27   GLUCOSE 160* 161* 166*  BUN 97* 95* 88*  CREATININE 2.41* 2.12* 2.01*  CALCIUM 9.7 10.0 9.5   LFT No results found for this basename: PROT, ALBUMIN, AST, ALT, ALKPHOS, BILITOT, BILIDIR, IBILI,  in the last 72 hours PT/INR  Recent Labs  01/10/14 1740  LABPROT 13.5  INR 1.05   HStudies/Results: Dg Foot Complete Right  01/12/2014   CLINICAL DATA:  Foot pain  EXAM: RIGHT FOOT COMPLETE - 3+ VIEW  COMPARISON:   None.  FINDINGS: There is no evidence of fracture or dislocation. There is no evidence of arthropathy or other focal bone abnormality. Soft tissues are unremarkable.  IMPRESSION: Negative.   Electronically Signed   By: Margaree Mackintosh M.D.   On: 01/12/2014 08:43   Medications: I have reviewed the patient's current medications.  Assessment/Plan: 1) Anemia with melena: currently stable. Agree with discharge tomorrow if stable. Patient has been advised to follow up with Dr. Laural [facility] his primary GI ASAP.  2) NASH cirrhosis with thrombocytopenia. 3) Morbid obesity.    LOS: 2 days   MANN,JYOTHI 01/12/2014, 4:07 PM

## 2014-01-12 NOTE — Progress Notes (Signed)
SUBJECTIVE:  No SOB.  Only complains of pain on right foot   PHYSICAL EXAM Filed Vitals:   01/12/14 0434 01/12/14 0800 01/12/14 0900 01/12/14 0902  BP: 91/50 48/38 106/58 106/58  Pulse: 50 63  121  Temp: 98.1 F (36.7 C)   97.9 F (36.6 C)  TempSrc: Oral   Oral  Resp: 11 17 9 11   Height:      Weight:      SpO2: 99% 98%  96%   General:  No distress Lungs:  Clear Heart:  Irregular Abdomen:  Positive bowel sounds, no rebound no guarding Extremities:  Trace edema  LABS:  Results for orders placed during the hospital encounter of 01/10/14 (from the past 24 hour(s))  GLUCOSE, CAPILLARY     Status: Abnormal   Collection Time    01/11/14 12:27 PM      Result Value Ref Range   Glucose-Capillary 153 (*) 70 - 99 mg/dL  HEMOGLOBIN AND HEMATOCRIT, BLOOD     Status: Abnormal   Collection Time    01/11/14 12:57 PM      Result Value Ref Range   Hemoglobin 8.8 (*) 13.0 - 17.0 g/dL   HCT 28.7 (*) 39.0 - 52.0 %  GLUCOSE, CAPILLARY     Status: Abnormal   Collection Time    01/11/14  5:10 PM      Result Value Ref Range   Glucose-Capillary 138 (*) 70 - 99 mg/dL  GLUCOSE, CAPILLARY     Status: Abnormal   Collection Time    01/11/14  9:54 PM      Result Value Ref Range   Glucose-Capillary 132 (*) 70 - 99 mg/dL   Comment 1 Documented in Chart     Comment 2 Notify RN    HEMOGLOBIN AND HEMATOCRIT, BLOOD     Status: Abnormal   Collection Time    01/12/14 12:55 AM      Result Value Ref Range   Hemoglobin 8.3 (*) 13.0 - 17.0 g/dL   HCT 27.2 (*) 39.0 - 72.5 %  BASIC METABOLIC PANEL     Status: Abnormal   Collection Time    01/12/14 12:55 AM      Result Value Ref Range   Sodium 135 (*) 137 - 147 mEq/L   Potassium 3.0 (*) 3.7 - 5.3 mEq/L   Chloride 90 (*) 96 - 112 mEq/L   CO2 27  19 - 32 mEq/L   Glucose, Bld 166 (*) 70 - 99 mg/dL   BUN 88 (*) 6 - 23 mg/dL   Creatinine, Ser 2.01 (*) 0.50 - 1.35 mg/dL   Calcium 9.5  8.4 - 10.5 mg/dL   GFR calc non Af Amer 33 (*) >90 mL/min   GFR calc Af Amer 38 (*) >90 mL/min  MAGNESIUM     Status: None   Collection Time    01/12/14 12:55 AM      Result Value Ref Range   Magnesium 2.1  1.5 - 2.5 mg/dL  URIC ACID     Status: Abnormal   Collection Time    01/12/14  8:54 AM      Result Value Ref Range   Uric Acid, Serum 21.1 (*) 4.0 - 7.8 mg/dL    Intake/Output Summary (Last 24 hours) at 01/12/14 1029 Last data filed at 01/12/14 3664  Gross per 24 hour  Intake 1706.25 ml  Output   3275 ml  Net -1568.75 ml    ASSESSMENT AND PLAN:  ACUTE ON CHRONIC DIASTOLIC  HF:    Seems to be euvolemic.  Continuing home diuretic.     ATRIAL FIB:   Rate OK. Not on anticoagulation.   Continue meds as listed.    CKD:  Creat is improving.  Holding ARB  Please call with further questions     Minus Breeding 01/12/2014 10:29 AM

## 2014-01-12 NOTE — Progress Notes (Addendum)
Patient Demographics  [name], is a 68 y.o. male, DOB - [birth year], AQT:[id number]  Admit date - 01/10/2014   Admitting Physician Thurnell Lose, MD  Outpatient Primary MD for the patient is [name], MD  LOS - 2   Chief Complaint  Patient presents with  . Anemia        Subjective:   [name] today has, No headache, No chest pain, No abdominal pain - No Nausea, No new weakness tingling or numbness, No Cough - SOB. Feels less weak this morning and overall much better.    Assessment & Plan    1. 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 No signs of ongoing acute profuse bleeding.    2.NASH with portal hypertension and thrombocytopenia. No acute issues supportive care. Stable INR, no esophageal varices on  EGD .    3. Chronic diastolic CHF Ef 56%. Mild fluid overload, no shortness of breath, home dose diuretic continued, no extra IV fluids. Got extra IV Lasix on 01/11/2014.    4. History of CAD and atrial fibrillation. No acute issues, no chest pain, currently in atrial fibrillation, for now aspirin will be held due to #1 above. Continue Cardizem for rate control.    5. Acute renal failure and chronic kidney disease stage III. Baseline creatinine is around 1.5, acute renal failure due to #1 above, status post transfusion, continue to hold Cozaar, monitor BMP closely. Creatinine  improving.     6. DM type II.  reduce NPH to 15 units twice a day instead of 25, low-dose sliding scale insulin with meals. Hold Glucophage.    Lab Results  Component Value Date   HGBA1C 5.8* 01/10/2014    CBG (last 3)   Recent Labs  01/11/14 1227 01/11/14 1710 01/11/14 2154  GLUCAP 153* 138* 132*     7. Dyslipidemia. Continue home dose statin.      8. Hypertension. Currently blood pressure on the softer side, will hold Cozaar, continue Cardizem and monitor.     9. Low potassium. Replaced and recheck in a.m.      Code Status: Full  Family Communication: wife bedside  Disposition Plan: Home   Procedures due for EGD 01/11/2014   Consults GI   Medications  Scheduled Meds: . atorvastatin  20 mg Oral QHS  . diltiazem  90 mg Oral 3 times per day  . insulin aspart  0-9 Units Subcutaneous TID WC  . insulin NPH Human  15 Units Subcutaneous BID AC & HS  . [START ON 01/13/2014] metolazone  2.5 mg Oral Once per day on Mon Thu  . metoprolol succinate  100 mg Oral BID  . pantoprazole  40 mg Oral BID  . potassium chloride  40 mEq Oral Q4H  . sodium chloride  3 mL Intravenous Q12H  . torsemide  100 mg Oral BID  . vitamin  C  500 mg Oral BID   Continuous Infusions:   PRN Meds:.guaiFENesin-dextromethorphan, HYDROcodone-acetaminophen, ondansetron (ZOFRAN) IV, polyethylene glycol  DVT Prophylaxis    SCDs    Lab Results  Component Value Date   PLT 168 01/11/2014    Antibiotics     Anti-infectives   None          Objective:   Filed Vitals:   01/12/14 0100 01/12/14 0200 01/12/14 0300 01/12/14 0434  BP: 84/59 87/57 109/58 91/50  Pulse: 61 49 61 50  Temp:    98.1 F (36.7 C)  TempSrc:    Oral  Resp: 14 14 16 11   Height:      Weight:      SpO2: 97% 97% 97% 99%    Wt Readings from Last 3 Encounters:  01/11/14 120.9 kg (266 lb 8.6 oz)  01/11/14 120.9 kg (266 lb 8.6 oz)  01/01/14 120.657 kg (266 lb)     Intake/Output Summary (Last 24 hours)  at 01/12/14 0856 Last data filed at 01/12/14 0700  Gross per 24 hour  Intake   1175 ml  Output   3775 ml  Net  -2600 ml     Physical Exam  Awake Alert, Oriented X 3, No new F.N deficits, Normal affect [place].AT,PERRAL Supple Neck,No JVD, No cervical lymphadenopathy appriciated.  Symmetrical Chest wall movement, Good air movement bilaterally, CTAB RRR,No Gallops,Rubs or new Murmurs, No Parasternal Heave +ve B.Sounds, Abd Soft but mildly distended, No tenderness, No organomegaly appriciated, No rebound - guarding or rigidity. No Cyanosis, Clubbing or edema, No new Rash or bruise      Data Review   Micro Results Recent Results (from the past 240 hour(s))  MRSA PCR SCREENING     Status: None   Collection Time    01/10/14  7:00 PM      Result Value Ref Range Status   MRSA by PCR NEGATIVE  NEGATIVE Final   Comment:            The GeneXpert MRSA Assay (FDA     approved for NASAL specimens     only), is one component of a     comprehensive MRSA colonization     surveillance program. It is not     intended to diagnose MRSA     infection nor to guide or     monitor treatment for     MRSA infections.    Radiology Reports No results found.  CBC  Recent Labs Lab 01/10/14 0901 01/10/14 1720 01/11/14 0705 01/11/14 1257 01/12/14 0055  WBC 8.2 8.8 8.6  --   --   HGB 7.5* 6.7* 8.7* 8.8* 8.3*  HCT 24.6* 22.1* 28.4* 28.7* 27.2*  PLT 157 176 168  --   --   MCV 88.2 87.0 86.3  --   --   MCH 26.9 26.4 26.4  --   --   MCHC 30.5 30.3 30.6  --   --   RDW 16.9* 16.9* 16.5*  --   --   LYMPHSABS  --  1.3  --   --   --   MONOABS  --  0.9  --   --   --   EOSABS  --  0.2  --   --   --   BASOSABS  --  0.1  --   --   --     Chemistries   Recent Labs Lab 01/10/14 0847 01/10/14 1720 01/11/14 0705 01/12/14 0055  NA 136* 134* 135* 135*  K 3.0* 3.4* 3.3* 3.0*  CL 90* 88* 89* 90*  CO2 28 26 30 27   GLUCOSE 167* 160* 161* 166*  BUN 96* 97* 95* 88*  CREATININE 1.97* 2.41* 2.12*  2.01*  CALCIUM 9.9 9.7 10.0 9.5  MG  --   --   --  2.1   ------------------------------------------------------------------------------------------------------------------ estimated creatinine clearance is 46.3 ml/min (by C-G formula based on Cr of 2.01). ------------------------------------------------------------------------------------------------------------------  Recent Labs  01/10/14 1740  HGBA1C 5.8*   ------------------------------------------------------------------------------------------------------------------ No results found for this basename: CHOL, HDL, LDLCALC, TRIG, CHOLHDL, LDLDIRECT,  in the last 72 hours ------------------------------------------------------------------------------------------------------------------ No results found for this basename: TSH, T4TOTAL, FREET3, T3FREE, THYROIDAB,  in the last 72 hours ------------------------------------------------------------------------------------------------------------------  Recent Labs  01/10/14 1800  VITAMINB12 782  FOLATE >20.0  FERRITIN 12*  TIBC 515*  IRON 32*  RETICCTPCT 9.3*    Coagulation profile  Recent Labs Lab 01/10/14 1740  INR 1.05    No results found for this basename: DDIMER,  in the last 72 hours  Cardiac Enzymes No results found for this basename: CK, CKMB, TROPONINI, MYOGLOBIN,  in the last 168 hours ------------------------------------------------------------------------------------------------------------------ No components found with this basename: POCBNP,      Time Spent in minutes   35   SINGH,PRASHANT K M.D on 01/12/2014 at 8:56 AM  Between 7am to 7pm - Pager - [phone]  After 7pm go to [email or web address] - password [email or web address]  And look for the night coverage person covering for me after hours  [facility] Office  [phone]   **Disclaimer: This note may have been dictated with voice recognition software. Similar sounding words can inadvertently be  transcribed and this note may contain transcription errors which may not have been corrected upon publication of note.**

## 2014-01-12 NOTE — Progress Notes (Signed)
[name] [id number]  Transfer Data: 01/12/2014 12:03 PM  Attending Provider: Thurnell Lose, MD  BTD:HRCBUL, [name], MD  Code Status: full  [name] is a 68 y.o. male patient transferred from 2c -No acute distress noted.  -No complaints of shortness of breath.  -No complaints of chest pain.  Cardiac Monitoring:  Box # 21 in place.  Cardiac monitor yields:normal sinus rhythm.  Blood pressure 101/82, pulse 132, temperature 97.9 F (36.6 C), temperature source Oral, resp. rate 12, height 5\' 8"  (1.727 m), weight 120.9 kg (266 lb 8.6 oz), SpO2 96.00%.  ?  IV Fluids: IV in place, occlusive dsg intact without redness, IV cath forearm right, condition patent and no redness  none.  Allergies: Codeine; Diltiazem hcl; and Niacin  Past Medical History:  has a past medical history of Overweight(278.02); CAD (coronary artery disease); Atrial fibrillation; DM2 (diabetes mellitus, type 2); Insomnia; Carotid bruit (06/14/2011); Hypertension; Chronic diastolic heart failure; GERD (gastroesophageal reflux disease); COPD (chronic obstructive pulmonary disease); Iron deficiency anemia; H/O hiatal hernia; AVM (arteriovenous malformation); Hyperlipidemia; Thrombocytopenia; Ascites; Osteoarthritis; Mediastinal adenopathy (09/22/2011); Cirrhosis; Iron deficiency anemia; Mediastinal adenopathy (09/22/2011); Aortic stenosis (03/08/2012); and OSA (obstructive sleep apnea) (1999).  Past Surgical History:  has past surgical history that includes Coronary artery bypass graft ( 10/15/2002); Carpal tunnel release (  10/08/2003); lipoma surgery; Hernia repair; Other surgical history (08/26/2011); TEE without cardioversion (03/07/2012); Coronary angioplasty with stent (01/19/2005); Aortic valve replacement (04/25/2012); and Esophagogastroduodenoscopy (N/A, 07/31/2013).  Social History:  reports that he quit smoking about 13 years ago. His smoking use included Cigarettes. He has a 30 pack-year smoking history. He has never  used smokeless tobacco. He reports that he drinks about .6 ounces of alcohol per week. He reports that he does not use illicit drugs.  Skin: intact  Patient/Family orientated to room. Information packet given to patient/family. Admission inpatient armband information verified with patient/family to include name and date of birth and placed on patient arm. Side rails up x 2, fall assessment and education completed with patient/family. Patient/family able to verbalize understanding of risk associated with falls and verbalized understanding to call for assistance before getting out of bed. Call light within reach. Patient/family able to voice and demonstrate understanding of unit orientation instructions.  Will continue to evaluate and treat per MD orders.

## 2014-01-13 ENCOUNTER — Encounter (HOSPITAL_COMMUNITY): Payer: Self-pay | Admitting: Gastroenterology

## 2014-01-13 ENCOUNTER — Inpatient Hospital Stay (HOSPITAL_COMMUNITY): Payer: Medicare Other

## 2014-01-13 DIAGNOSIS — N179 Acute kidney failure, unspecified: Secondary | ICD-10-CM | POA: Diagnosis not present

## 2014-01-13 DIAGNOSIS — M19079 Primary osteoarthritis, unspecified ankle and foot: Secondary | ICD-10-CM | POA: Diagnosis not present

## 2014-01-13 DIAGNOSIS — M109 Gout, unspecified: Secondary | ICD-10-CM | POA: Diagnosis not present

## 2014-01-13 LAB — GLUCOSE, CAPILLARY
Glucose-Capillary: 203 mg/dL — ABNORMAL HIGH (ref 70–99)
Glucose-Capillary: 196 mg/dL — ABNORMAL HIGH (ref 70–99)
Glucose-Capillary: 202 mg/dL — ABNORMAL HIGH (ref 70–99)
Glucose-Capillary: 239 mg/dL — ABNORMAL HIGH (ref 70–99)

## 2014-01-13 LAB — POTASSIUM
Potassium: 2.8 mEq/L — CL (ref 3.7–5.3)
Potassium: 4.3 mEq/L (ref 3.7–5.3)

## 2014-01-13 MED ORDER — POTASSIUM CHLORIDE CRYS ER 20 MEQ PO TBCR
40.0000 meq | EXTENDED_RELEASE_TABLET | ORAL | Status: AC
  Administered 2014-01-13 (×2): 40 meq via ORAL
  Filled 2014-01-13 (×2): qty 2

## 2014-01-13 MED ORDER — COLCHICINE 0.6 MG PO TABS
0.6000 mg | ORAL_TABLET | Freq: Two times a day (BID) | ORAL | Status: DC
  Administered 2014-01-13 – 2014-01-14 (×3): 0.6 mg via ORAL
  Filled 2014-01-13 (×4): qty 1

## 2014-01-13 MED ORDER — POTASSIUM CHLORIDE 10 MEQ/100ML IV SOLN
10.0000 meq | INTRAVENOUS | Status: AC
  Administered 2014-01-13 (×3): 10 meq via INTRAVENOUS
  Filled 2014-01-13 (×4): qty 100

## 2014-01-13 MED ORDER — METHYLPREDNISOLONE SODIUM SUCC 40 MG IJ SOLR
40.0000 mg | Freq: Once | INTRAMUSCULAR | Status: AC
  Administered 2014-01-13: 40 mg via INTRAVENOUS
  Filled 2014-01-13: qty 1

## 2014-01-13 MED ORDER — POTASSIUM CHLORIDE 10 MEQ/100ML IV SOLN
10.0000 meq | INTRAVENOUS | Status: AC
  Administered 2014-01-13 (×4): 10 meq via INTRAVENOUS
  Filled 2014-01-13 (×4): qty 100

## 2014-01-13 NOTE — Progress Notes (Signed)
CRITICAL VALUE ALERT  Critical value received:  Potassium 2.8  Date of notification:  01/13/2014  Time of notification:  5:49 AM  Critical value read back:yes  Nurse who received alert:  Roberto Scales  MD notified (1st page):  Rogue Bussing, NP  Time of first page:  5:50 AM  MD notified (2nd page):  Time of second page:  Responding MD:  Rogue Bussing, NP  Time MD responded:  6:02 AM    4 runs of Potassium IV ordered.

## 2014-01-13 NOTE — Progress Notes (Signed)
Central Tele called pt had a 3.54 sec pause and his heart rate went down to 33. After about 5 minutes his heart rate went up to the 130s. RN went into patients room with the oncoming nurse and patient had thrown up. MD paged.

## 2014-01-13 NOTE — Consult Note (Signed)
Reason for Consult:right ankle pain. Referring Physician: hospitalists  [name] is an 68 y.o. male.  HPI: the patient is a 69 year old male with a history of GI bleed who is on diuretics he had sudden severe pain in the right ankle.  He has a history of gout and had been treated with steroids but not had dramatic relief as was anticipated.  We are consult for evaluation and discussion of the possibility of this being an infection.  The patient has not had fever.[name]  He at this point states that he is dramatically better than he was even earlier today.  He can move the ankle much better than he could earlier today.  He has not tried stand on the ankle but feels that his feeling dramatically better.  Past Medical History  Diagnosis Date  . Overweight(278.02)   . CAD (coronary artery disease)     a. s/p CABG 2004;  b. [place] 10/13:  LHC 8/13: Free radial to obtuse marginal patent, SVG-diagonal patent, LIMA-LAD patent, EF 65-70%, mean aortic valve gradient 42  . Atrial fibrillation     Permanent; off of Coumadin for now due to GI bleed  . DM2 (diabetes mellitus, type 2)   . Insomnia   . Carotid bruit 06/14/2011    a. pre-AVR dopplers 10/13: no sig ICA stenosis  . Hypertension   . Chronic diastolic heart failure   . GERD (gastroesophageal reflux disease)   . COPD (chronic obstructive pulmonary disease)   . Iron deficiency anemia     Requiring intravenous iron  . H/O hiatal hernia   . AVM (arteriovenous malformation)     Recurrent GI bleeding requiring multiple transfusions  . Hyperlipidemia   . Thrombocytopenia   . Ascites     status post paracentesis with removal of 3.4 L of ascitic fluid  . Osteoarthritis   . Mediastinal adenopathy 09/22/2011  . Cirrhosis   . Iron deficiency anemia   . Mediastinal adenopathy 09/22/2011  . Aortic stenosis 03/08/2012    a.  s/p tissue AVR 10/13 with Dr. [name];   b. Echo 10/13: mod LVH, EF 55-60%, tissue AVR not well seen, no leak, gradient not  too high (mean 40mmHg), MAC, mild MR, mild LAE, PASP 38  . OSA (obstructive sleep apnea) 1999    DOES NOT USE CPAP    Past Surgical History  Procedure Laterality Date  . Coronary artery bypass graft   10/15/2002     [name]. [name], M.D.     . Carpal tunnel release    10/08/2003  . Lipoma surgery    . Hernia repair    . Other surgical history  08/26/2011    [facility],  enteroscopy , revealing "three-four AVMs."   . Tee without cardioversion  03/07/2012    Procedure: TRANSESOPHAGEAL ECHOCARDIOGRAM (TEE);  Surgeon: Thayer Headings, MD;  Location: [facility] ENDOSCOPY;  Service: Cardiovascular;  Laterality: N/A;  . Coronary angioplasty with stent placement  01/19/2005    drug eluting stent to high grade ostial stenosis of radial artery graft to OM  . Aortic valve replacement  04/25/2012    Procedure: AORTIC VALVE REPLACEMENT (AVR);  Surgeon: Melrose Nakayama, MD;  Location: [place];  Service: Open Heart Surgery;  Laterality: N/A;  . Esophagogastroduodenoscopy N/A 07/31/2013    Procedure: ESOPHAGOGASTRODUODENOSCOPY (EGD);  Surgeon: Milus Banister, MD;  Location: [place];  Service: Endoscopy;  Laterality: N/A;  . Esophagogastroduodenoscopy Left 01/11/2014    Procedure: ESOPHAGOGASTRODUODENOSCOPY (EGD);  Surgeon: Juanita Craver, MD;  Location: [facility] ENDOSCOPY;  Service: Endoscopy;  Laterality: Left;    Family History  Problem Relation Age of Onset  . Coronary artery disease      FAMILY HISTORY  . Hypertension Father   . Diabetes Father   . Heart disease Father   . COPD Sister   . Cancer Maternal Aunt     Breast cancer   . Cancer Maternal Aunt     Breast cancer   . Diabetes Son   . Cancer Daughter     Cervical cancer    Social History:  reports that he quit smoking about 13 years ago. His smoking use included Cigarettes. He has a 30 pack-year smoking history. He has never used smokeless tobacco. He reports that he drinks about .6 ounces of alcohol per week. He reports that he does not use  illicit drugs.  Allergies:  Allergies  Allergen Reactions  . Codeine Other (See Comments)    Hurting in chest  . Diltiazem Hcl Itching  . Niacin Other (See Comments)    Hot flashes    Medications: I have reviewed the patient's current medications.  Results for orders placed during the hospital encounter of 01/10/14 (from the past 48 hour(s))  GLUCOSE, CAPILLARY     Status: Abnormal   Collection Time    01/11/14  9:54 PM      Result Value Ref Range   Glucose-Capillary 132 (*) 70 - 99 mg/dL   Comment 1 Documented in Chart     Comment 2 Notify RN    HEMOGLOBIN AND HEMATOCRIT, BLOOD     Status: Abnormal   Collection Time    01/12/14 12:55 AM      Result Value Ref Range   Hemoglobin 8.3 (*) 13.0 - 17.0 g/dL   HCT 27.2 (*) 39.0 - 09.7 %  BASIC METABOLIC PANEL     Status: Abnormal   Collection Time    01/12/14 12:55 AM      Result Value Ref Range   Sodium 135 (*) 137 - 147 mEq/L   Potassium 3.0 (*) 3.7 - 5.3 mEq/L   Chloride 90 (*) 96 - 112 mEq/L   CO2 27  19 - 32 mEq/L   Glucose, Bld 166 (*) 70 - 99 mg/dL   BUN 88 (*) 6 - 23 mg/dL   Creatinine, Ser 2.01 (*) 0.50 - 1.35 mg/dL   Calcium 9.5  8.4 - 10.5 mg/dL   GFR calc non Af Amer 33 (*) >90 mL/min   GFR calc Af Amer 38 (*) >90 mL/min   Comment: (NOTE)     The eGFR has been calculated using the CKD EPI equation.     This calculation has not been validated in all clinical situations.     eGFR's persistently <90 mL/min signify possible Chronic Kidney     Disease.  MAGNESIUM     Status: None   Collection Time    01/12/14 12:55 AM      Result Value Ref Range   Magnesium 2.1  1.5 - 2.5 mg/dL  GLUCOSE, CAPILLARY     Status: Abnormal   Collection Time    01/12/14  8:39 AM      Result Value Ref Range   Glucose-Capillary 157 (*) 70 - 99 mg/dL  URIC ACID     Status: Abnormal   Collection Time    01/12/14  8:54 AM      Result Value Ref Range   Uric Acid, Serum 21.1 (*) 4.0 - 7.8 mg/dL  GLUCOSE, CAPILLARY     Status:  Abnormal   Collection Time    01/12/14 11:25 AM      Result Value Ref Range   Glucose-Capillary 143 (*) 70 - 99 mg/dL  GLUCOSE, CAPILLARY     Status: Abnormal   Collection Time    01/12/14 11:58 AM      Result Value Ref Range   Glucose-Capillary 129 (*) 70 - 99 mg/dL  HEMOGLOBIN AND HEMATOCRIT, BLOOD     Status: Abnormal   Collection Time    01/12/14  1:45 PM      Result Value Ref Range   Hemoglobin 8.8 (*) 13.0 - 17.0 g/dL   HCT 28.7 (*) 39.0 - 52.0 %  GLUCOSE, CAPILLARY     Status: Abnormal   Collection Time    01/12/14  5:42 PM      Result Value Ref Range   Glucose-Capillary 142 (*) 70 - 99 mg/dL  GLUCOSE, CAPILLARY     Status: Abnormal   Collection Time    01/12/14  9:24 PM      Result Value Ref Range   Glucose-Capillary 179 (*) 70 - 99 mg/dL   Comment 1 Documented in Chart     Comment 2 Notify RN    POTASSIUM     Status: Abnormal   Collection Time    01/13/14  4:04 AM      Result Value Ref Range   Potassium 2.8 (*) 3.7 - 5.3 mEq/L   Comment: CRITICAL RESULT CALLED TO, READ BACK BY AND VERIFIED WITH:     MUHAMMAD,A RN 01/13/2014 0543 JORDANS     REPEATED TO VERIFY  GLUCOSE, CAPILLARY     Status: Abnormal   Collection Time    01/13/14  7:52 AM      Result Value Ref Range   Glucose-Capillary 203 (*) 70 - 99 mg/dL  GLUCOSE, CAPILLARY     Status: Abnormal   Collection Time    01/13/14 12:02 PM      Result Value Ref Range   Glucose-Capillary 196 (*) 70 - 99 mg/dL  POTASSIUM     Status: None   Collection Time    01/13/14  1:45 PM      Result Value Ref Range   Potassium 4.3  3.7 - 5.3 mEq/L   Comment: DELTA CHECK NOTED  GLUCOSE, CAPILLARY     Status: Abnormal   Collection Time    01/13/14  4:47 PM      Result Value Ref Range   Glucose-Capillary 202 (*) 70 - 99 mg/dL    Dg Ankle Complete Right  01/13/2014   CLINICAL DATA:  Right ankle pain and swelling.  EXAM: RIGHT ANKLE - COMPLETE 3+ VIEW  COMPARISON:  None.  FINDINGS: There is no fracture or dislocation.  There are minimal degenerative changes of the medial malleolus. There are calcifications in the distal Achilles tendon and there is a plantar calcaneal spur. Slight dorsal spurring on the navicular.  IMPRESSION: No acute abnormality.  Chronic degenerative changes as described.   Electronically Signed   By: Rozetta Nunnery M.D.   On: 01/13/2014 19:11   Dg Foot Complete Right  01/12/2014   CLINICAL DATA:  Foot pain  EXAM: RIGHT FOOT COMPLETE - 3+ VIEW  COMPARISON:  None.  FINDINGS: There is no evidence of fracture or dislocation. There is no evidence of arthropathy or other focal bone abnormality. Soft tissues are unremarkable.  IMPRESSION: Negative.   Electronically Signed   By: Margaree Mackintosh  M.D.   On: 01/12/2014 08:43    ROS ROS: I have reviewed the patient's review of systems thoroughly and there are no positive responses as relates to the HPI. Exam: Blood pressure 103/70, pulse 123, temperature 98.7 F (37.1 C), temperature source Oral, resp. rate 18, height 5' 8"  (1.727 m), weight 266 lb 8.6 oz (120.9 kg), SpO2 96.00%. Physical Exam Well-developed well-nourished patient in no acute distress. Alert and oriented x3 HEENT:within normal limits Cardiac: Regular rate and rhythm Pulmonary: Lungs clear to auscultation Abdomen: Soft and nontender.  Normal active bowel sounds  Musculoskeletal: (right ankle 1+ effusion.  No instability.  Mild pain to range of motion.  No erythema or warmth.) Recent Results (from the past 2160 hour(s))  BASIC METABOLIC PANEL     Status: Abnormal   Collection Time    10/29/13 10:14 AM      Result Value Ref Range   Sodium 137  135 - 145 mEq/L   Potassium 4.0  3.5 - 5.1 mEq/L   Chloride 96  96 - 112 mEq/L   CO2 32  19 - 32 mEq/L   Glucose, Bld 126 (*) 70 - 99 mg/dL   BUN 45 (*) 6 - 23 mg/dL   Creatinine, Ser 1.6 (*) 0.4 - 1.5 mg/dL   Calcium 9.7  8.4 - 10.5 mg/dL   GFR 46.94 (*) >60.00 mL/min  BASIC METABOLIC PANEL     Status: Abnormal   Collection Time     11/05/13  9:58 AM      Result Value Ref Range   Sodium 140  137 - 147 mEq/L   Potassium 4.2  3.7 - 5.3 mEq/L   Chloride 97  96 - 112 mEq/L   CO2 27  19 - 32 mEq/L   Glucose, Bld 185 (*) 70 - 99 mg/dL   BUN 40 (*) 6 - 23 mg/dL   Creatinine, Ser 1.32  0.50 - 1.35 mg/dL   Calcium 9.8  8.4 - 10.5 mg/dL   GFR calc non Af Amer 55 (*) >90 mL/min   GFR calc Af Amer 64 (*) >90 mL/min   Comment: (NOTE)     The eGFR has been calculated using the CKD EPI equation.     This calculation has not been validated in all clinical situations.     eGFR's persistently <90 mL/min signify possible Chronic Kidney     Disease.  CBC     Status: Abnormal   Collection Time    11/14/13  2:03 PM      Result Value Ref Range   WBC 6.9  4.0 - 10.5 K/uL   RBC 4.29  4.22 - 5.81 MIL/uL   Hemoglobin 11.6 (*) 13.0 - 17.0 g/dL   HCT 35.2 (*) 39.0 - 52.0 %   MCV 82.1  78.0 - 100.0 fL   MCH 27.0  26.0 - 34.0 pg   MCHC 33.0  30.0 - 36.0 g/dL   RDW 16.4 (*) 11.5 - 15.5 %   Platelets 125 (*) 150 - 400 K/uL  BASIC METABOLIC PANEL     Status: Abnormal   Collection Time    11/14/13  2:03 PM      Result Value Ref Range   Sodium 140  137 - 147 mEq/L   Potassium 3.6 (*) 3.7 - 5.3 mEq/L   Chloride 97  96 - 112 mEq/L   CO2 25  19 - 32 mEq/L   Glucose, Bld 147 (*) 70 - 99 mg/dL   BUN 67 (*)  6 - 23 mg/dL   Creatinine, Ser 1.52 (*) 0.50 - 1.35 mg/dL   Calcium 9.9  8.4 - 10.5 mg/dL   GFR calc non Af Amer 46 (*) >90 mL/min   GFR calc Af Amer 54 (*) >90 mL/min   Comment: (NOTE)     The eGFR has been calculated using the CKD EPI equation.     This calculation has not been validated in all clinical situations.     eGFR's persistently <90 mL/min signify possible Chronic Kidney     Disease.  IRON AND TIBC     Status: Abnormal   Collection Time    11/14/13  2:03 PM      Result Value Ref Range   Iron 54  42 - 135 ug/dL   TIBC 527 (*) 215 - 435 ug/dL   Saturation Ratios 10 (*) 20 - 55 %   UIBC 473 (*) 125 - 400 ug/dL    Comment: Performed at [place]     Status: Abnormal   Collection Time    12/11/13  9:21 AM      Result Value Ref Range   Sodium 137  137 - 147 mEq/L   Potassium 3.6 (*) 3.7 - 5.3 mEq/L   Chloride 93 (*) 96 - 112 mEq/L   CO2 25  19 - 32 mEq/L   Glucose, Bld 197 (*) 70 - 99 mg/dL   BUN 47 (*) 6 - 23 mg/dL   Creatinine, Ser 1.56 (*) 0.50 - 1.35 mg/dL   Calcium 9.8  8.4 - 10.5 mg/dL   GFR calc non Af Amer 45 (*) >90 mL/min   GFR calc Af Amer 52 (*) >90 mL/min   Comment: (NOTE)     The eGFR has been calculated using the CKD EPI equation.     This calculation has not been validated in all clinical situations.     eGFR's persistently <90 mL/min signify possible Chronic Kidney     Disease.  CBC     Status: Abnormal   Collection Time    12/11/13  9:21 AM      Result Value Ref Range   WBC 7.7  4.0 - 10.5 K/uL   RBC 3.57 (*) 4.22 - 5.81 MIL/uL   Hemoglobin 9.6 (*) 13.0 - 17.0 g/dL   HCT 30.5 (*) 39.0 - 52.0 %   MCV 85.4  78.0 - 100.0 fL   MCH 26.9  26.0 - 34.0 pg   MCHC 31.5  30.0 - 36.0 g/dL   RDW 17.7 (*) 11.5 - 15.5 %   Platelets 130 (*) 150 - 400 K/uL  PRO B NATRIURETIC PEPTIDE     Status: Abnormal   Collection Time    12/11/13  9:21 AM      Result Value Ref Range   Pro B Natriuretic peptide (BNP) 1152.0 (*) 0 - 125 pg/mL  APTT     Status: None   Collection Time    12/24/13  3:29 PM      Result Value Ref Range   aPTT 31  24 - 37 seconds  BASIC METABOLIC PANEL     Status: Abnormal   Collection Time    12/24/13  3:29 PM      Result Value Ref Range   Sodium 133 (*) 137 - 147 mEq/L   Potassium 3.5 (*) 3.7 - 5.3 mEq/L   Chloride 88 (*) 96 - 112 mEq/L   CO2 27  19 - 32 mEq/L  Glucose, Bld 196 (*) 70 - 99 mg/dL   BUN 52 (*) 6 - 23 mg/dL   Creatinine, Ser 1.41 (*) 0.50 - 1.35 mg/dL   Calcium 10.3  8.4 - 10.5 mg/dL   GFR calc non Af Amer 51 (*) >90 mL/min   GFR calc Af Amer 59 (*) >90 mL/min   Comment: (NOTE)     The eGFR has been  calculated using the CKD EPI equation.     This calculation has not been validated in all clinical situations.     eGFR's persistently <90 mL/min signify possible Chronic Kidney     Disease.  CBC     Status: Abnormal   Collection Time    12/24/13  3:29 PM      Result Value Ref Range   WBC 6.9  4.0 - 10.5 K/uL   RBC 3.72 (*) 4.22 - 5.81 MIL/uL   Hemoglobin 9.9 (*) 13.0 - 17.0 g/dL   HCT 31.4 (*) 39.0 - 52.0 %   MCV 84.4  78.0 - 100.0 fL   MCH 26.6  26.0 - 34.0 pg   MCHC 31.5  30.0 - 36.0 g/dL   RDW 16.5 (*) 11.5 - 15.5 %   Platelets 125 (*) 150 - 400 K/uL  IRON AND TIBC     Status: Abnormal   Collection Time    12/24/13  3:29 PM      Result Value Ref Range   Iron 56  42 - 135 ug/dL   TIBC 532 (*) 215 - 435 ug/dL   Saturation Ratios 11 (*) 20 - 55 %   UIBC 476 (*) 125 - 400 ug/dL   Comment: Performed at [facility] [facility]     Status: Abnormal   Collection Time    12/24/13  3:29 PM      Result Value Ref Range   Ferritin 21 (*) 22 - 322 ng/mL   Comment: Performed at [place]     Status: None   Collection Time    01/01/14 10:18 AM      Result Value Ref Range   Prothrombin Time 13.6  11.6 - 15.2 seconds   INR 1.06  0.00 - 1.49  POCT I-STAT 3, VENOUS BLOOD GAS (G3P V)     Status: Abnormal   Collection Time    01/01/14  4:12 PM      Result Value Ref Range   pH, Ven 7.391 (*) 7.250 - 7.300   pCO2, Ven 51.1 (*) 45.0 - 50.0 mmHg   pO2, Ven 32.0  30.0 - 45.0 mmHg   Bicarbonate 31.0 (*) 20.0 - 24.0 mEq/L   TCO2 33  0 - 100 mmol/L   O2 Saturation 59.0     Acid-Base Excess 5.0 (*) 0.0 - 2.0 mmol/L   Sample type VENOUS     Comment NOTIFIED PHYSICIAN    POCT I-STAT 3, VENOUS BLOOD GAS (G3P V)     Status: Abnormal   Collection Time    01/01/14  4:12 PM      Result Value Ref Range   pH, Ven 7.381 (*) 7.250 - 7.300   pCO2, Ven 49.3  45.0 - 50.0 mmHg   pO2, Ven 32.0  30.0 - 45.0 mmHg   Bicarbonate 29.3 (*) 20.0 - 24.0 mEq/L   TCO2 31  0 - 100  mmol/L   O2 Saturation 60.0     Acid-Base Excess 4.0 (*) 0.0 - 2.0 mmol/L   Sample type VENOUS  Comment NOTIFIED PHYSICIAN    GLUCOSE, CAPILLARY     Status: Abnormal   Collection Time    01/01/14  4:35 PM      Result Value Ref Range   Glucose-Capillary 197 (*) 70 - 99 mg/dL  BASIC METABOLIC PANEL     Status: Abnormal   Collection Time    01/10/14  8:47 AM      Result Value Ref Range   Sodium 136 (*) 137 - 147 mEq/L   Potassium 3.0 (*) 3.7 - 5.3 mEq/L   Chloride 90 (*) 96 - 112 mEq/L   CO2 28  19 - 32 mEq/L   Glucose, Bld 167 (*) 70 - 99 mg/dL   BUN 96 (*) 6 - 23 mg/dL   Creatinine, Ser 1.97 (*) 0.50 - 1.35 mg/dL   Calcium 9.9  8.4 - 10.5 mg/dL   GFR calc non Af Amer 34 (*) >90 mL/min   GFR calc Af Amer 39 (*) >90 mL/min   Comment: (NOTE)     The eGFR has been calculated using the CKD EPI equation.     This calculation has not been validated in all clinical situations.     eGFR's persistently <90 mL/min signify possible Chronic Kidney     Disease.  CBC     Status: Abnormal   Collection Time    01/10/14  9:01 AM      Result Value Ref Range   WBC 8.2  4.0 - 10.5 K/uL   RBC 2.79 (*) 4.22 - 5.81 MIL/uL   Hemoglobin 7.5 (*) 13.0 - 17.0 g/dL   HCT 24.6 (*) 39.0 - 52.0 %   MCV 88.2  78.0 - 100.0 fL   MCH 26.9  26.0 - 34.0 pg   MCHC 30.5  30.0 - 36.0 g/dL   RDW 16.9 (*) 11.5 - 15.5 %   Platelets 157  150 - 400 K/uL  BASIC METABOLIC PANEL     Status: Abnormal   Collection Time    01/10/14  5:20 PM      Result Value Ref Range   Sodium 134 (*) 137 - 147 mEq/L   Potassium 3.4 (*) 3.7 - 5.3 mEq/L   Chloride 88 (*) 96 - 112 mEq/L   CO2 26  19 - 32 mEq/L   Glucose, Bld 160 (*) 70 - 99 mg/dL   BUN 97 (*) 6 - 23 mg/dL   Creatinine, Ser 2.41 (*) 0.50 - 1.35 mg/dL   Calcium 9.7  8.4 - 10.5 mg/dL   GFR calc non Af Amer 27 (*) >90 mL/min   GFR calc Af Amer 31 (*) >90 mL/min   Comment: (NOTE)     The eGFR has been calculated using the CKD EPI equation.     This calculation has  not been validated in all clinical situations.     eGFR's persistently <90 mL/min signify possible Chronic Kidney     Disease.  CBC WITH DIFFERENTIAL     Status: Abnormal   Collection Time    01/10/14  5:20 PM      Result Value Ref Range   WBC 8.8  4.0 - 10.5 K/uL   RBC 2.54 (*) 4.22 - 5.81 MIL/uL   Hemoglobin 6.7 (*) 13.0 - 17.0 g/dL   Comment: REPEATED TO VERIFY     CRITICAL RESULT CALLED TO, READ BACK BY AND VERIFIED WITH:     J GAGE RN 1812 01/10/14 A BROWNING   HCT 22.1 (*) 39.0 - 52.0 %  MCV 87.0  78.0 - 100.0 fL   MCH 26.4  26.0 - 34.0 pg   MCHC 30.3  30.0 - 36.0 g/dL   RDW 16.9 (*) 11.5 - 15.5 %   Platelets 176  150 - 400 K/uL   Neutrophils Relative % 73  43 - 77 %   Neutro Abs 6.4  1.7 - 7.7 K/uL   Lymphocytes Relative 15  12 - 46 %   Lymphs Abs 1.3  0.7 - 4.0 K/uL   Monocytes Relative 10  3 - 12 %   Monocytes Absolute 0.9  0.1 - 1.0 K/uL   Eosinophils Relative 2  0 - 5 %   Eosinophils Absolute 0.2  0.0 - 0.7 K/uL   Basophils Relative 1  0 - 1 %   Basophils Absolute 0.1  0.0 - 0.1 K/uL  TYPE AND SCREEN     Status: None   Collection Time    01/10/14  5:40 PM      Result Value Ref Range   ABO/RH(D) A POS     Antibody Screen POS     Sample Expiration 01/13/2014     DAT, IgG NEG     Antibody Identification ANTI E     Unit Number [id number]     Blood Component Type RED CELLS,LR     Unit division 00     Status of Unit ISSUED,FINAL     Transfusion Status OK TO TRANSFUSE     Crossmatch Result COMPATIBLE     Donor AG Type NEGATIVE FOR E ANTIGEN NEGATIVE FOR KELL ANTIGEN     Unit Number [id number]     Blood Component Type RED CELLS,LR     Unit division 00     Status of Unit ISSUED,FINAL     Transfusion Status OK TO TRANSFUSE     Crossmatch Result COMPATIBLE     Donor AG Type NEGATIVE FOR E ANTIGEN NEGATIVE FOR KELL ANTIGEN    PREPARE RBC (CROSSMATCH)     Status: None   Collection Time    01/10/14  5:40 PM      Result Value Ref Range   Order Confirmation  ORDER PROCESSED BY BLOOD BANK    PROTIME-INR     Status: None   Collection Time    01/10/14  5:40 PM      Result Value Ref Range   Prothrombin Time 13.5  11.6 - 15.2 seconds   INR 1.05  0.00 - 1.49  HEMOGLOBIN A1C     Status: Abnormal   Collection Time    01/10/14  5:40 PM      Result Value Ref Range   Hemoglobin A1C 5.8 (*) <5.7 %   Comment: (NOTE)                                                                               According to the ADA Clinical Practice Recommendations for 2011, when     HbA1c is used as a screening test:      >=6.5%   Diagnostic of Diabetes Mellitus               (if abnormal result is confirmed)     5.7-6.4%  Increased risk of developing Diabetes Mellitus     References:Diagnosis and Classification of Diabetes Mellitus,Diabetes     YSAY,3016,01(UXNAT 1):S62-S69 and Standards of Medical Care in             Diabetes - 2011,Diabetes FTDD,2202,54 (Suppl 1):S11-S61.   Mean Plasma Glucose 120 (*) <117 mg/dL   Comment: Performed at [place]     Status: None   Collection Time    01/10/14  6:00 PM      Result Value Ref Range   Vitamin B-12 782  211 - 911 pg/mL   Comment: Performed at [place]     Status: None   Collection Time    01/10/14  6:00 PM      Result Value Ref Range   Folate >20.0     Comment: (NOTE)     Reference Ranges            Deficient:       0.4 - 3.3 ng/mL            Indeterminate:   3.4 - 5.4 ng/mL            Normal:              > 5.4 ng/mL     Performed at [place] TIBC     Status: Abnormal   Collection Time    01/10/14  6:00 PM      Result Value Ref Range   Iron 32 (*) 42 - 135 ug/dL   TIBC 515 (*) 215 - 435 ug/dL   Saturation Ratios 6 (*) 20 - 55 %   UIBC 483 (*) 125 - 400 ug/dL   Comment: Performed at [place]     Status: Abnormal   Collection Time    01/10/14  6:00 PM      Result Value Ref Range   Ferritin 12 (*) 22 - 322 ng/mL    Comment: Performed at [place]     Status: Abnormal   Collection Time    01/10/14  6:00 PM      Result Value Ref Range   Retic Ct Pct 9.3 (*) 0.4 - 3.1 %   RBC. 2.45 (*) 4.22 - 5.81 MIL/uL   Retic Count, Manual 227.9 (*) 19.0 - 186.0 K/uL  OSMOLALITY     Status: Abnormal   Collection Time    01/10/14  6:00 PM      Result Value Ref Range   Osmolality 310 (*) 275 - 300 mOsm/kg   Comment: Performed at [place], ED     Status: Abnormal   Collection Time    01/10/14  6:14 PM      Result Value Ref Range   Fecal Occult Bld POSITIVE (*) NEGATIVE  MRSA PCR SCREENING     Status: None   Collection Time    01/10/14  7:00 PM      Result Value Ref Range   MRSA by PCR NEGATIVE  NEGATIVE   Comment:            The GeneXpert MRSA Assay (FDA     approved for NASAL specimens     only), is one component of a     comprehensive MRSA colonization     surveillance program. It is not     intended to diagnose MRSA     infection nor  to guide or     monitor treatment for     MRSA infections.  GLUCOSE, CAPILLARY     Status: Abnormal   Collection Time    01/10/14  9:41 PM      Result Value Ref Range   Glucose-Capillary 152 (*) 70 - 99 mg/dL   Comment 1 Documented in Chart     Comment 2 Notify RN    CREATININE, URINE, RANDOM     Status: None   Collection Time    01/10/14 10:13 PM      Result Value Ref Range   Creatinine, Urine 64.66    OSMOLALITY, URINE     Status: Abnormal   Collection Time    01/10/14 10:13 PM      Result Value Ref Range   Osmolality, Ur 322 (*) 390 - 1090 mOsm/kg   Comment: Performed at [facility]  SODIUM, URINE, RANDOM     Status: None   Collection Time    01/10/14 10:13 PM      Result Value Ref Range   Sodium, Ur 61    BASIC METABOLIC PANEL     Status: Abnormal   Collection Time    01/11/14  7:05 AM      Result Value Ref Range   Sodium 135 (*) 137 - 147 mEq/L   Potassium 3.3 (*) 3.7 - 5.3 mEq/L    Chloride 89 (*) 96 - 112 mEq/L   CO2 30  19 - 32 mEq/L   Glucose, Bld 161 (*) 70 - 99 mg/dL   BUN 95 (*) 6 - 23 mg/dL   Creatinine, Ser 2.12 (*) 0.50 - 1.35 mg/dL   Calcium 10.0  8.4 - 10.5 mg/dL   GFR calc non Af Amer 31 (*) >90 mL/min   GFR calc Af Amer 36 (*) >90 mL/min   Comment: (NOTE)     The eGFR has been calculated using the CKD EPI equation.     This calculation has not been validated in all clinical situations.     eGFR's persistently <90 mL/min signify possible Chronic Kidney     Disease.  CBC     Status: Abnormal   Collection Time    01/11/14  7:05 AM      Result Value Ref Range   WBC 8.6  4.0 - 10.5 K/uL   RBC 3.29 (*) 4.22 - 5.81 MIL/uL   Hemoglobin 8.7 (*) 13.0 - 17.0 g/dL   Comment: POST TRANSFUSION SPECIMEN   HCT 28.4 (*) 39.0 - 52.0 %   MCV 86.3  78.0 - 100.0 fL   MCH 26.4  26.0 - 34.0 pg   MCHC 30.6  30.0 - 36.0 g/dL   RDW 16.5 (*) 11.5 - 15.5 %   Platelets 168  150 - 400 K/uL  GLUCOSE, CAPILLARY     Status: Abnormal   Collection Time    01/11/14  8:06 AM      Result Value Ref Range   Glucose-Capillary 155 (*) 70 - 99 mg/dL  GLUCOSE, CAPILLARY     Status: Abnormal   Collection Time    01/11/14 12:27 PM      Result Value Ref Range   Glucose-Capillary 153 (*) 70 - 99 mg/dL  HEMOGLOBIN AND HEMATOCRIT, BLOOD     Status: Abnormal   Collection Time    01/11/14 12:57 PM      Result Value Ref Range   Hemoglobin 8.8 (*) 13.0 - 17.0 g/dL   HCT 28.7 (*) 39.0 -  52.0 %  GLUCOSE, CAPILLARY     Status: Abnormal   Collection Time    01/11/14  5:10 PM      Result Value Ref Range   Glucose-Capillary 138 (*) 70 - 99 mg/dL  GLUCOSE, CAPILLARY     Status: Abnormal   Collection Time    01/11/14  9:54 PM      Result Value Ref Range   Glucose-Capillary 132 (*) 70 - 99 mg/dL   Comment 1 Documented in Chart     Comment 2 Notify RN    HEMOGLOBIN AND HEMATOCRIT, BLOOD     Status: Abnormal   Collection Time    01/12/14 12:55 AM      Result Value Ref Range   Hemoglobin  8.3 (*) 13.0 - 17.0 g/dL   HCT 27.2 (*) 39.0 - 74.1 %  BASIC METABOLIC PANEL     Status: Abnormal   Collection Time    01/12/14 12:55 AM      Result Value Ref Range   Sodium 135 (*) 137 - 147 mEq/L   Potassium 3.0 (*) 3.7 - 5.3 mEq/L   Chloride 90 (*) 96 - 112 mEq/L   CO2 27  19 - 32 mEq/L   Glucose, Bld 166 (*) 70 - 99 mg/dL   BUN 88 (*) 6 - 23 mg/dL   Creatinine, Ser 2.01 (*) 0.50 - 1.35 mg/dL   Calcium 9.5  8.4 - 10.5 mg/dL   GFR calc non Af Amer 33 (*) >90 mL/min   GFR calc Af Amer 38 (*) >90 mL/min   Comment: (NOTE)     The eGFR has been calculated using the CKD EPI equation.     This calculation has not been validated in all clinical situations.     eGFR's persistently <90 mL/min signify possible Chronic Kidney     Disease.  MAGNESIUM     Status: None   Collection Time    01/12/14 12:55 AM      Result Value Ref Range   Magnesium 2.1  1.5 - 2.5 mg/dL  GLUCOSE, CAPILLARY     Status: Abnormal   Collection Time    01/12/14  8:39 AM      Result Value Ref Range   Glucose-Capillary 157 (*) 70 - 99 mg/dL  URIC ACID     Status: Abnormal   Collection Time    01/12/14  8:54 AM      Result Value Ref Range   Uric Acid, Serum 21.1 (*) 4.0 - 7.8 mg/dL  GLUCOSE, CAPILLARY     Status: Abnormal   Collection Time    01/12/14 11:25 AM      Result Value Ref Range   Glucose-Capillary 143 (*) 70 - 99 mg/dL  GLUCOSE, CAPILLARY     Status: Abnormal   Collection Time    01/12/14 11:58 AM      Result Value Ref Range   Glucose-Capillary 129 (*) 70 - 99 mg/dL  HEMOGLOBIN AND HEMATOCRIT, BLOOD     Status: Abnormal   Collection Time    01/12/14  1:45 PM      Result Value Ref Range   Hemoglobin 8.8 (*) 13.0 - 17.0 g/dL   HCT 28.7 (*) 39.0 - 52.0 %  GLUCOSE, CAPILLARY     Status: Abnormal   Collection Time    01/12/14  5:42 PM      Result Value Ref Range   Glucose-Capillary 142 (*) 70 - 99 mg/dL  GLUCOSE, CAPILLARY     Status:  Abnormal   Collection Time    01/12/14  9:24 PM       Result Value Ref Range   Glucose-Capillary 179 (*) 70 - 99 mg/dL   Comment 1 Documented in Chart     Comment 2 Notify RN    POTASSIUM     Status: Abnormal   Collection Time    01/13/14  4:04 AM      Result Value Ref Range   Potassium 2.8 (*) 3.7 - 5.3 mEq/L   Comment: CRITICAL RESULT CALLED TO, READ BACK BY AND VERIFIED WITH:     MUHAMMAD,A RN 01/13/2014 0543 JORDANS     REPEATED TO VERIFY  GLUCOSE, CAPILLARY     Status: Abnormal   Collection Time    01/13/14  7:52 AM      Result Value Ref Range   Glucose-Capillary 203 (*) 70 - 99 mg/dL  GLUCOSE, CAPILLARY     Status: Abnormal   Collection Time    01/13/14 12:02 PM      Result Value Ref Range   Glucose-Capillary 196 (*) 70 - 99 mg/dL  POTASSIUM     Status: None   Collection Time    01/13/14  1:45 PM      Result Value Ref Range   Potassium 4.3  3.7 - 5.3 mEq/L   Comment: DELTA CHECK NOTED  GLUCOSE, CAPILLARY     Status: Abnormal   Collection Time    01/13/14  4:47 PM      Result Value Ref Range   Glucose-Capillary 202 (*) 70 - 99 mg/dL   Assessment/Plan: 68 year old male with history of GI bleed on diuretics he has sudden onset pain in the right ankle which is improved dramatically with steroids.  The patient had an extremely high uric acid and hasn't had a history of gout.  The patient has improved significantly over the last several hours.  He is currently not on antibiotics.//Given the clinical situation I think this almost certainly is gout.  I discussed possibly aspirating the patient's ankle but he declined and I think this is certainly reasonable given that he is doing so much better.  We will follow him overnight but I think that if he continues to improve on the steroids as indicated I would be not inclined to aspirate the ankle.  If his condition deteriorates we certainly will think about aspiration of the ankle.  I will see him back in the office about 10 days after discharge.  GRAVES,JOHN L 01/13/2014, 9:39 PM

## 2014-01-13 NOTE — Progress Notes (Signed)
Patient Demographics  [name], is a 68 y.o. male, DOB - 27-Sep-1945, OVF:[id number]  Admit date - 01/10/2014   Admitting Physician Thurnell Lose, MD  Outpatient Primary MD for the patient is Matthew Melter, MD  LOS - 3   Chief Complaint  Patient presents with  . Anemia        Subjective:   [name] today has, No headache, No chest pain, No abdominal pain - +ve Nausea, No new weakness tingling or numbness, No Cough - SOB. Feels less weak this morning and overall much better.    Assessment & Plan    1. 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 No signs of ongoing acute profuse bleeding.     2.NASH with portal hypertension and thrombocytopenia. No acute issues supportive care. Stable INR, no esophageal varices on  EGD .     3. Chronic diastolic CHF Ef 84%. Mild fluid overload, no shortness of breath, home dose diuretic continued, no extra IV fluids. Got extra IV Lasix on 01/11/2014.     4. History of CAD and atrial fibrillation. No acute issues, no chest pain, currently in atrial fibrillation, for now aspirin will be held due to #1 above. Continue Cardizem for rate control. He did have an episode where he bradycardia down into the 30s in the morning of 01/13/2014 this likely was a vagal response because he was having an episode of emesis at that time. Monitor on telemetry. Cardiology  informed of following.    5. Acute renal failure and chronic kidney disease stage III. Baseline creatinine is around 1.5, acute renal failure due to #1 above, status post transfusion, continue to hold Cozaar, monitor BMP closely. Creatinine improving.      6. DM type II.  reduce NPH to 15 units twice a day instead of 25, low-dose sliding scale insulin with meals. Hold Glucophage.    Lab Results  Component Value Date   HGBA1C 5.8* 01/10/2014    CBG (last 3)   Recent Labs  01/12/14 1742 01/12/14 2124 01/13/14 0752  GLUCAP 142* 179* 203*      7. Dyslipidemia. Continue home dose statin.      8. Hypertension. Currently blood pressure on the softer side, will hold Cozaar, continue Cardizem and monitor.     9. Low potassium. Replaced and recheck with Mag.     10.N&V -  in the morning of 01/13/2014, likely side effect of narcotic that he received 4 foot pain, supportive care monitor     11. Right foot pain. X-ray stable. Uric acid 21, likely mild gout, no signs of effusion or erythema. Mild tenderness on exam. We'll place on colchicine with one dose of IV steroid and monitor      Code Status: Full  Family Communication: wife bedside  Disposition Plan: Home   Procedures due for EGD 01/11/2014   Consults  GI cardiology   Medications  Scheduled Meds: . atorvastatin  20 mg Oral QHS  . diltiazem  90 mg Oral 3 times per day  . insulin aspart  0-9 Units Subcutaneous TID WC  . insulin NPH Human  15 Units Subcutaneous BID AC & HS  . metolazone  2.5 mg Oral Once per day on Mon Thu  . metoprolol succinate  100 mg Oral BID  . pantoprazole  40 mg Oral BID  . potassium chloride  10 mEq Intravenous Q1 Hr x 4  . sodium chloride  3 mL Intravenous Q12H  . torsemide  100 mg Oral BID  . vitamin C  500 mg Oral BID   Continuous Infusions:   PRN Meds:.guaiFENesin-dextromethorphan, HYDROcodone-acetaminophen, ondansetron (ZOFRAN) IV, polyethylene glycol  DVT  Prophylaxis    SCDs    Lab Results  Component Value Date   PLT 168 01/11/2014    Antibiotics     Anti-infectives   None          Objective:   Filed Vitals:   01/12/14 2008 01/13/14 0432 01/13/14 0503 01/13/14 1008  BP: 120/78 94/61  101/67  Pulse: 134 100 104 96  Temp: 97.9 F (36.6 C) 98.2 F (36.8 C)    TempSrc: Oral Oral    Resp: 16 16    Height:      Weight:  120.9 kg (266 lb 8.6 oz)    SpO2: 97% 96%      Wt Readings from Last 3 Encounters:  01/13/14 120.9 kg (266 lb 8.6 oz)  01/13/14 120.9 kg (266 lb 8.6 oz)  01/01/14 120.657 kg (266 lb)     Intake/Output Summary (Last 24 hours) at 01/13/14 1044 Last data filed at 01/13/14 1013  Gross per 24 hour  Intake      0 ml  Output   2850 ml  Net  -2850 ml     Physical Exam  Awake Alert, Oriented X 3, No new F.N deficits, Normal affect [place].AT,PERRAL Supple Neck,No JVD, No cervical lymphadenopathy appriciated.  Symmetrical Chest wall movement, Good air movement bilaterally, CTAB RRR,No Gallops,Rubs or new Murmurs, No Parasternal Heave +ve B.Sounds, Abd Soft but mildly distended, No tenderness, No organomegaly appriciated, No rebound - guarding or rigidity. No Cyanosis, Clubbing or edema, No new Rash or bruise      Data Review   Micro Results Recent Results (from the past 240 hour(s))  MRSA PCR SCREENING     Status: None   Collection Time    01/10/14  7:00 PM      Result Value Ref Range Status   MRSA by PCR NEGATIVE  NEGATIVE Final   Comment:            The GeneXpert MRSA Assay (FDA     approved for NASAL specimens     only), is one component of a     comprehensive MRSA colonization     surveillance program. It is not     intended to diagnose MRSA     infection nor to guide or     monitor treatment for     MRSA infections.    Radiology Reports No results found.  CBC  Recent Labs Lab 01/10/14 0901 01/10/14 1720 01/11/14 0705 01/11/14 1257 01/12/14 0055 01/12/14 1345  WBC 8.2 8.8 8.6   --   --   --   HGB 7.5* 6.7* 8.7* 8.8* 8.3* 8.8*  HCT 24.6* 22.1* 28.4* 28.7* 27.2* 28.7*  PLT 157 176 168  --   --   --  MCV 88.2 87.0 86.3  --   --   --   MCH 26.9 26.4 26.4  --   --   --   MCHC 30.5 30.3 30.6  --   --   --   RDW 16.9* 16.9* 16.5*  --   --   --   LYMPHSABS  --  1.3  --   --   --   --   MONOABS  --  0.9  --   --   --   --   EOSABS  --  0.2  --   --   --   --   BASOSABS  --  0.1  --   --   --   --     Chemistries   Recent Labs Lab 01/10/14 0847 01/10/14 1720 01/11/14 0705 01/12/14 0055 01/13/14 0404  NA 136* 134* 135* 135*  --   K 3.0* 3.4* 3.3* 3.0* 2.8*  CL 90* 88* 89* 90*  --   CO2 28 26 30 27   --   GLUCOSE 167* 160* 161* 166*  --   BUN 96* 97* 95* 88*  --   CREATININE 1.97* 2.41* 2.12* 2.01*  --   CALCIUM 9.9 9.7 10.0 9.5  --   MG  --   --   --  2.1  --    ------------------------------------------------------------------------------------------------------------------ estimated creatinine clearance is 46.3 ml/min (by C-G formula based on Cr of 2.01). ------------------------------------------------------------------------------------------------------------------  Recent Labs  01/10/14 1740  HGBA1C 5.8*   ------------------------------------------------------------------------------------------------------------------ No results found for this basename: CHOL, HDL, LDLCALC, TRIG, CHOLHDL, LDLDIRECT,  in the last 72 hours ------------------------------------------------------------------------------------------------------------------ No results found for this basename: TSH, T4TOTAL, FREET3, T3FREE, THYROIDAB,  in the last 72 hours ------------------------------------------------------------------------------------------------------------------  Recent Labs  01/10/14 1800  VITAMINB12 782  FOLATE >20.0  FERRITIN 12*  TIBC 515*  IRON 32*  RETICCTPCT 9.3*    Coagulation profile  Recent Labs Lab 01/10/14 1740  INR 1.05    No results  found for this basename: DDIMER,  in the last 72 hours  Cardiac Enzymes No results found for this basename: CK, CKMB, TROPONINI, MYOGLOBIN,  in the last 168 hours ------------------------------------------------------------------------------------------------------------------ No components found with this basename: POCBNP,      Time Spent in minutes   35   SINGH,PRASHANT K M.D on 01/13/2014 at 10:44 AM  Between 7am to 7pm - Pager - [phone]  After 7pm go to [email or web address] - password [email or web address]  And look for the night coverage person covering for me after hours  [facility] Office  [phone]   **Disclaimer: This note may have been dictated with voice recognition software. Similar sounding words can inadvertently be transcribed and this note may contain transcription errors which may not have been corrected upon publication of note.**

## 2014-01-14 DIAGNOSIS — N179 Acute kidney failure, unspecified: Secondary | ICD-10-CM | POA: Diagnosis not present

## 2014-01-14 DIAGNOSIS — M109 Gout, unspecified: Secondary | ICD-10-CM | POA: Diagnosis not present

## 2014-01-14 LAB — GLUCOSE, CAPILLARY
Glucose-Capillary: 166 mg/dL — ABNORMAL HIGH (ref 70–99)
Glucose-Capillary: 222 mg/dL — ABNORMAL HIGH (ref 70–99)

## 2014-01-14 MED ORDER — DSS 100 MG PO CAPS: 200.0000 mg | ORAL_CAPSULE | Freq: Two times a day (BID) | ORAL | Status: DC

## 2014-01-14 MED ORDER — ASPIRIN EC 325 MG PO TBEC
325.0000 mg | DELAYED_RELEASE_TABLET | Freq: Every day | ORAL | Status: DC
Start: 2014-01-21 — End: 2014-01-16

## 2014-01-14 MED ORDER — DOCUSATE SODIUM 100 MG PO CAPS
200.0000 mg | ORAL_CAPSULE | Freq: Two times a day (BID) | ORAL | Status: DC
Start: 2014-01-14 — End: 2014-01-14
  Administered 2014-01-14: 200 mg via ORAL
  Filled 2014-01-14: qty 2

## 2014-01-14 MED ORDER — COLCHICINE 0.6 MG PO TABS: 0.6000 mg | ORAL_TABLET | Freq: Two times a day (BID) | ORAL | Status: DC

## 2014-01-14 MED ORDER — METHYLPREDNISOLONE SODIUM SUCC 40 MG IJ SOLR
40.0000 mg | Freq: Once | INTRAMUSCULAR | Status: AC
  Administered 2014-01-14: 40 mg via INTRAVENOUS
  Filled 2014-01-14 (×2): qty 1

## 2014-01-14 MED ORDER — PANTOPRAZOLE SODIUM 40 MG PO TBEC
40.0000 mg | DELAYED_RELEASE_TABLET | Freq: Two times a day (BID) | ORAL | Status: DC
Start: 2014-01-14 — End: 2014-02-19

## 2014-01-14 NOTE — Progress Notes (Signed)
Subjective: Much better today   Objective: Vital signs in last 24 hours: Temp:  [98 F (36.7 C)-98.7 F (37.1 C)] 98.2 F (36.8 C) (06/23 0525) Pulse Rate:  [76-123] 76 (06/23 0525) Resp:  [16-18] 18 (06/22 2118) BP: (101-131)/(67-95) 105/95 mmHg (06/23 0525) SpO2:  [94 %-98 %] 94 % (06/23 0525) Weight:  [260 lb 5.8 oz (118.1 kg)] 260 lb 5.8 oz (118.1 kg) (06/23 0407)  Intake/Output from previous day: 06/22 0701 - 06/23 0700 In: -  Out: 2880 [Urine:2880] Intake/Output this shift:     Recent Labs  01/11/14 1257 01/12/14 0055 01/12/14 1345  HGB 8.8* 8.3* 8.8*    Recent Labs  01/12/14 0055 01/12/14 1345  HCT 27.2* 28.7*    Recent Labs  01/12/14 0055 01/13/14 0404 01/13/14 1345  NA 135*  --   --   K 3.0* 2.8* 4.3  CL 90*  --   --   CO2 27  --   --   BUN 88*  --   --   CREATININE 2.01*  --   --   GLUCOSE 166*  --   --   CALCIUM 9.5  --   --    No results found for this basename: LABPT, INR,  in the last 72 hours  Neurologically intact Neurovascular intact Sensation intact distally Intact pulses distally Dorsiflexion/Plantar flexion intact No cellulitis present Compartment soft pain dramatically better and can stand and walk.  Assessment/Plan: Gout r ankle resolving// f/u graves as needed at d/c   GRAVES,JOHN L 01/14/2014, 7:57 AM

## 2014-01-14 NOTE — Discharge Instructions (Signed)
Follow with Primary MD [name], MD in 7 days   Get CBC, CMP, 2 view Chest X ray checked  by Primary MD next visit.    Activity: As tolerated with Full fall precautions use walker/cane & assistance as needed   Disposition Home     Diet: Heart Healthy , Check your Weight same time everyday, if you gain over 2 pounds, or you develop in leg swelling, experience more shortness of breath or chest pain, call your Primary MD immediately. Follow Cardiac Low Salt Diet and 1.8 lit/day fluid restriction.   On your next visit with her primary care physician please Get Medicines reviewed and adjusted.  Please request your Prim.MD to go over all Hospital Tests and Procedure/Radiological results at the follow up, please get all Hospital records sent to your Prim MD by signing hospital release before you go home.   If you experience worsening of your admission symptoms, develop shortness of breath, life threatening emergency, suicidal or homicidal thoughts you must seek medical attention immediately by calling 911 or calling your MD immediately  if symptoms less severe.  You Must read complete instructions/literature along with all the possible adverse reactions/side effects for all the Medicines you take and that have been prescribed to you. Take any new Medicines after you have completely understood and accpet all the possible adverse reactions/side effects.   Do not drive and provide baby sitting services if your were admitted for syncope or siezures until you have seen by Primary MD or a Neurologist and advised to do so again.  Do not drive when taking Pain medications.    Do not take more than prescribed Pain, Sleep and Anxiety Medications  Special Instructions: If you have smoked or chewed Tobacco  in the last 2 yrs please stop smoking, stop any regular Alcohol  and or any Recreational drug use.  Wear Seat belts while driving.   Please note  You were cared for by a hospitalist  during your hospital stay. If you have any questions about your discharge medications or the care you received while you were in the hospital after you are discharged, you can call the unit and asked to speak with the hospitalist on call if the hospitalist that took care of you is not available. Once you are discharged, your primary care physician will handle any further medical issues. Please note that NO REFILLS for any discharge medications will be authorized once you are discharged, as it is imperative that you return to your primary care physician (or establish a relationship with a primary care physician if you do not have one) for your aftercare needs so that they can reassess your need for medications and monitor your lab values.

## 2014-01-14 NOTE — Discharge Summary (Addendum)
[name], is a 68 y.o. male  DOB 1946/02/26  MRN [id number].  Admission date:  01/10/2014  Admitting Physician  Thurnell Lose, MD  Discharge Date:  01/14/2014   Primary MD  [name], MD  Recommendations for primary care physician for things to follow:   Monitor CBC, BMP, A1c, iron panel closely.  Monitor blood pressure adjust medications as needed.   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 [537.83] Symptomatic anemia [285.9] Other cirrhosis of liver [571.5] Acute renal failure, unspecified acute renal failure type [584.9]   Discharge Diagnosis  Aortic stenosis [424.1] Iron deficiency anemia [280.9] AVM (arteriovenous malformation) [747.60] Thrombocytopenia [287.5] Chronic GI bleeding [578.9] 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.61] Atrial fibrillation with RVR [427.31] Essential hypertension [761.6] Chronic diastolic CHF (congestive heart failure) [428.32, 428.0] Type 2 diabetes mellitus without complication [073.71] 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 [537.83] Symptomatic anemia [285.9] Other cirrhosis of liver [571.5] Acute renal failure, unspecified acute renal failure type [584.9]     Principal Problem:   GI bleeding Active Problems:   HYPERLIPIDEMIA-MIXED   ANEMIA   Small bowel arteriovenous  malformation   Iron deficiency anemia   Hypertension   Permanent atrial fibrillation   Chronic diastolic CHF (congestive heart failure)   Type 2 diabetes mellitus   Thrombocytopenia due to hypersplenism   S/P CABG x 3   Unspecified gastritis and gastroduodenitis without mention of hemorrhage   CKD (chronic kidney disease)   ARF (acute renal failure)   Acute on chronic diastolic HF (heart failure)      Past Medical History  Diagnosis Date  . Overweight(278.02)   . CAD (coronary artery disease)     a. s/p CABG 2004;  b. [place] 10/13:  LHC 8/13: Free radial to obtuse marginal patent, SVG-diagonal patent, LIMA-LAD patent, EF 65-70%, mean aortic valve gradient 42  . Atrial fibrillation     Permanent; off of Coumadin for now due to GI bleed  . DM2 (diabetes mellitus, type 2)   . Insomnia   . Carotid bruit 06/14/2011    a. pre-AVR dopplers 10/13: no sig ICA stenosis  . Hypertension   . Chronic diastolic heart failure   . GERD (gastroesophageal reflux disease)   . COPD (chronic obstructive pulmonary disease)   . Iron deficiency anemia     Requiring intravenous iron  . H/O hiatal hernia   . AVM (arteriovenous malformation)     Recurrent GI bleeding requiring multiple transfusions  . Hyperlipidemia   . Thrombocytopenia   . Ascites     status post paracentesis with removal of 3.4 L of ascitic fluid  . Osteoarthritis   . Mediastinal adenopathy 09/22/2011  . Cirrhosis   . Iron deficiency anemia   .  Mediastinal adenopathy 09/22/2011  . Aortic stenosis 03/08/2012    a.  s/p tissue AVR 10/13 with Dr. [name];   b. Echo 10/13: mod LVH, EF 55-60%, tissue AVR not well seen, no leak, gradient not too high (mean 26mmHg), MAC, mild MR, mild LAE, PASP 38  . OSA (obstructive sleep apnea) 1999    DOES NOT USE CPAP    Past Surgical History  Procedure Laterality Date  . Coronary artery bypass graft   10/15/2002     [name]. [name], M.D.     . Carpal tunnel release    10/08/2003  .  Lipoma surgery    . Hernia repair    . Other surgical history  08/26/2011    [facility],  enteroscopy , revealing "three-four AVMs."   . Tee without cardioversion  03/07/2012    Procedure: TRANSESOPHAGEAL ECHOCARDIOGRAM (TEE);  Surgeon: Thayer Headings, MD;  Location: [facility] ENDOSCOPY;  Service: Cardiovascular;  Laterality: N/A;  . Coronary angioplasty with stent placement  01/19/2005    drug eluting stent to high grade ostial stenosis of radial artery graft to OM  . Aortic valve replacement  04/25/2012    Procedure: AORTIC VALVE REPLACEMENT (AVR);  Surgeon: Melrose Nakayama, MD;  Location: [place];  Service: Open Heart Surgery;  Laterality: N/A;  . Esophagogastroduodenoscopy N/A 07/31/2013    Procedure: ESOPHAGOGASTRODUODENOSCOPY (EGD);  Surgeon: Milus Banister, MD;  Location: [place];  Service: Endoscopy;  Laterality: N/A;  . Esophagogastroduodenoscopy Left 01/11/2014    Procedure: ESOPHAGOGASTRODUODENOSCOPY (EGD);  Surgeon: Juanita Craver, MD;  Location: [facility] ENDOSCOPY;  Service: Endoscopy;  Laterality: Left;     Discharge Condition: stable   Follow UP  Follow-up Information   Follow up with GRAVES,JOHN L, MD. (As needed or sooner if symptoms recur in ankle)    Specialty:  Orthopedic Surgery   Contact information:   [address] [place] [address] [phone]       Follow up with [name], MD. Schedule an appointment as soon as possible for a visit in 1 week.   Specialty:  Family Medicine   Contact information:   [place] [address] [phone]       Follow up with Glori Bickers, MD. Schedule an appointment as soon as possible for a visit in 1 week.   Specialty:  Cardiology   Contact information:   [address] [place] [address] [phone]       Follow up with Rogene Houston, MD. Schedule an appointment as soon as possible for a visit in 1 week.   Specialty:  Gastroenterology   Contact information:   [address], [address] [place] [address] [phone]         Discharge Instructions  and  Discharge Medications     Discharge Instructions   Discharge instructions    Complete by:  As directed   Follow with Primary MD [name], MD in 7 days   Get CBC, CMP, 2 view Chest X ray checked  by Primary MD next visit.    Activity: As tolerated with Full fall precautions use walker/cane & assistance as needed   Disposition Home     Diet: Heart Healthy - low carbohydrate, Check your Weight same time everyday, if you gain over 2 pounds, or you develop in leg swelling, experience more shortness of breath or chest pain, call your Primary MD immediately. Follow Cardiac Low Salt Diet and 1.8 lit/day fluid restriction.  On your next visit with her primary care physician please Get Medicines reviewed and adjusted.  Please request your Prim.MD to go over all Hospital Tests and Procedure/Radiological results at the follow up, please get all Hospital records sent to your Prim MD by signing hospital release before you go home.   If you experience worsening of your admission symptoms, develop shortness of breath, life threatening emergency, suicidal or homicidal thoughts you must seek medical attention immediately by calling 911 or calling your MD immediately  if symptoms less severe.  You Must read complete instructions/literature along with all the possible adverse reactions/side effects for all the Medicines you take and that have been prescribed to you. Take any new Medicines after you have completely understood and accpet all the possible adverse reactions/side effects.   Do not drive and provide baby sitting services if your were admitted for syncope or siezures until you have seen by Primary MD or a Neurologist and advised to do so again.  Do not drive when taking Pain medications.    Do not take more than prescribed Pain, Sleep and Anxiety Medications  Special Instructions: If you have  smoked or chewed Tobacco  in the last 2 yrs please stop smoking, stop any regular Alcohol  and or any Recreational drug use.  Wear Seat belts while driving.   Please note  You were cared for by a hospitalist during your hospital stay. If you have any questions about your discharge medications or the care you received while you were in the hospital after you are discharged, you can call the unit and asked to speak with the hospitalist on call if the hospitalist that took care of you is not available. Once you are discharged, your primary care physician will handle any further medical issues. Please note that NO REFILLS for any discharge medications will be authorized once you are discharged, as it is imperative that you return to your primary care physician (or establish a relationship with a primary care physician if you do not have one) for your aftercare needs so that they can reassess your need for medications and monitor your lab values.  Follow with Primary MD [name], MD in 7 days   Get CBC, CMP, 2 view Chest X ray checked  by Primary MD next visit.    Activity: As tolerated with Full fall precautions use walker/cane & assistance as needed   Disposition Home     Diet: Heart Healthy - low carbohydrate, Check your Weight same time everyday, if you gain over 2 pounds, or you develop in leg swelling, experience more shortness of breath or chest pain, call your Primary MD immediately. Follow Cardiac Low Salt Diet and 1.8 lit/day fluid restriction.   On your next visit with her primary care physician please Get Medicines reviewed and adjusted.  Please request your Prim.MD to go over all Hospital Tests and Procedure/Radiological results at the follow up, please get all Hospital records sent to your Prim MD by signing hospital release before you go home.   If you experience worsening of your admission symptoms, develop shortness of breath, life threatening emergency, suicidal or  homicidal thoughts you must seek medical attention immediately by calling 911 or calling your MD immediately  if symptoms less severe.  You Must read complete instructions/literature along with all the possible adverse reactions/side effects for all the Medicines you take and that have been prescribed to you. Take any new Medicines after you have completely understood and accpet all  the possible adverse reactions/side effects.   Do not drive and provide baby sitting services if your were admitted for syncope or siezures until you have seen by Primary MD or a Neurologist and advised to do so again.  Do not drive when taking Pain medications.    Do not take more than prescribed Pain, Sleep and Anxiety Medications  Special Instructions: If you have smoked or chewed Tobacco  in the last 2 yrs please stop smoking, stop any regular Alcohol  and or any Recreational drug use.  Wear Seat belts while driving.   Please note  You were cared for by a hospitalist during your hospital stay. If you have any questions about your discharge medications or the care you received while you were in the hospital after you are discharged, you can call the unit and asked to speak with the hospitalist on call if the hospitalist that took care of you is not available. Once you are discharged, your primary care physician will handle any further medical issues. Please note that NO REFILLS for any discharge medications will be authorized once you are discharged, as it is imperative that you return to your primary care physician (or establish a relationship with a primary care physician if you do not have one) for your aftercare needs so that they can reassess your need for medications and monitor your lab values.     Increase activity slowly    Complete by:  As directed             Medication List    STOP taking these medications       losartan 50 MG tablet  Commonly known as:  COZAAR     omeprazole 20 MG capsule    Commonly known as:  PRILOSEC      TAKE these medications       aspirin EC 325 MG tablet  Take 1 tablet (325 mg total) by mouth daily. Resume after 1 week once cleared by her stomach doctor Dr. Laural Golden  Start taking on:  01/21/2014     atorvastatin 20 MG tablet  Commonly known as:  LIPITOR  Take 20 mg by mouth every morning.     colchicine 0.6 MG tablet  Take 1 tablet (0.6 mg total) by mouth 2 (two) times daily.     [employer] SPECTRAVITE ADULT 50+ PO  Take 1 tablet by mouth daily.     DSS 100 MG Caps  Take 200 mg by mouth 2 (two) times daily.     ferrous gluconate 325 MG tablet  Commonly known as:  FERGON  Take 325 mg by mouth 2 (two) times daily.     insulin NPH Human 100 UNIT/ML injection  Commonly known as:  HUMULIN N,NOVOLIN N  Inject 20-25 Units into the skin 2 (two) times daily before a meal.     insulin regular 100 units/mL injection  Commonly known as:  NOVOLIN R,HUMULIN R  Inject 15 Units into the skin 2 (two) times daily before a meal.     metFORMIN 1000 MG tablet  Commonly known as:  GLUCOPHAGE  Take 1,000 mg by mouth 2 (two) times daily with a meal.     metolazone 2.5 MG tablet  Commonly known as:  ZAROXOLYN  Take 2.5 mg by mouth every Monday, Wednesday, and Friday.     metoprolol succinate 100 MG 24 hr tablet  Commonly known as:  TOPROL-XL  Take 1 tablet (100 mg total) by mouth 2 (two) times daily. Take  with or immediately following a meal.     pantoprazole 40 MG tablet  Commonly known as:  PROTONIX  Take 1 tablet (40 mg total) by mouth 2 (two) times daily.     potassium chloride 10 MEQ tablet  Commonly known as:  K-DUR,KLOR-CON  Take 40 mEq by mouth 2 (two) times daily.     torsemide 20 MG tablet  Commonly known as:  DEMADEX  Take 5 tablets (100 mg total) by mouth 2 (two) times daily.     verapamil 240 MG CR tablet  Commonly known as:  CALAN-SR  Take 1 tablet (240 mg total) by mouth at bedtime.     vitamin C 500 MG tablet  Commonly known as:   ASCORBIC ACID  Take 500 mg by mouth 2 (two) times daily.          Diet and Activity recommendation: See Discharge Instructions above   Consults obtained - GI, Cards, ortho   Major procedures and Radiology Reports - PLEASE review detailed and final reports for all details, in brief -   EGD - Dr. 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 No fresh or old heme in the upper GI tract. Otherwise, normal esophagogastroduodenoscopy.   RECOMMENDATIONS:   1. Anti-reflux regimen to be followed.  2. Avoid NSAIDS for now.  3. Continue current medications.  4. Clear liquid diet for now.  5. Serial CBC's for now.  6. May need a colonoscopy for further evaluation.   REPEAT EXAM: None planed for now.  DISCHARGE INSTRUCTIONS: discharge instructions given.      Dg Ankle Complete Right  01/13/2014   CLINICAL DATA:  Right ankle pain and swelling.  EXAM: RIGHT ANKLE - COMPLETE 3+ VIEW  COMPARISON:  None.  FINDINGS: There is no fracture or dislocation. There are minimal degenerative changes of the medial malleolus. There are calcifications in the distal Achilles tendon and there is a plantar calcaneal spur. Slight dorsal spurring on the navicular.  IMPRESSION: No acute abnormality.  Chronic degenerative changes as described.   Electronically Signed   By: Rozetta Nunnery M.D.   On: 01/13/2014 19:11   Dg Foot Complete Right  01/12/2014   CLINICAL DATA:  Foot pain  EXAM: RIGHT FOOT COMPLETE - 3+ VIEW  COMPARISON:  None.  FINDINGS: There is no evidence of fracture or dislocation. There is no evidence of arthropathy or other focal bone abnormality. Soft tissues are unremarkable.  IMPRESSION: Negative.   Electronically Signed   By: Margaree Mackintosh M.D.   On: 01/12/2014 08:43    Micro Results      Recent Results (from the past 240 hour(s))  MRSA PCR SCREENING     Status: None   Collection Time    01/10/14  7:00 PM       Result Value Ref Range Status   MRSA by PCR NEGATIVE  NEGATIVE Final   Comment:            The GeneXpert MRSA Assay (FDA     approved for NASAL specimens     only), is one component of a     comprehensive MRSA colonization     surveillance program. It is not     intended to diagnose MRSA     infection nor to guide or     monitor treatment for     MRSA infections.     History of present illness and  Hospital Course:     Kindly see H&P for history of present illness and admission details, please review complete Labs, Consult reports and Test reports for all details in brief Matthew ROEPKE, is a 68 y.o. male, patient with history of CAD status post CABG, history of small bowel AVMs and gastritis, NASH with portal hypertension but recent EGD did not show any varesis, EGD done a few months ago, portal hypertension related thrombocytopenia, dyslipidemia, COPD, GERD, chronic diastolic heart failure EF 55%, status post aortic valve replacement-bovine valve, atrial fibrillation not on anticoagulation due to recurrent GI bleeds who was feeling weak for the last few days and has noticed that his blood has become increasingly dark, also mild nausea without any emesis, went to PCP office early this morning when he was found to be anemic and then sent to the ER.    In the ER he was found to be anemic again, baseline hemoglobin runs around 9.5 currently he is on 6.7, he strongly Hemoccult positive in the ER. I was called to admit the patient for upper GI bleed related iron deficiency anemia acute on chronic.    1. 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 No signs of ongoing acute profuse bleeding. We'll place on PPI and hold aspirin for now. Outpatient followup with gastroenterologist Dr. Laural Golden in a timely fashion. Monitor H&H and iron panel closely.    2.NASH with portal hypertension and thrombocytopenia. No acute issues supportive care. Stable INR, no esophageal varices on EGD .      3.Acute on Chronic diastolic CHF Ef 38%. Mild fluid overload, resolved after extra IV Lasix, commence home dose diuretic along with salt and fluid restriction. Follow with primary cardiologist within a week. Note EF is preserved and I am holding ARB for now as blood pressure on the softer side and still improving acute renal failure.    4. History of CAD and atrial fibrillation. No acute issues, no chest pain, currently in atrial fibrillation, for now aspirin will be held due to #1 above. Continue Cardizem for rate control at home dose, follow closely with Dr. Jeffie Pollock, was seen here by cardiology group.     5. Acute renal failure and chronic kidney disease stage III. Baseline creatinine is around 1.5, acute renal failure due to #1 above, status post transfusion, continue to hold Cozaar, request PCP to monitor BMP closely. Creatinine improving.     6. DM type II. A1c was 5.8, commence home regimen unchanged.    7. Dyslipidemia. Continue home dose statin.     8. Hypertension. Currently blood pressure on the softer side, will continue to hold Cozaar, on home dose Cardizem upon discharge, we'll request PCP and cardiologist to monitor blood pressure and adjust medications as needed.     9. Low potassium. Replaced and stable.   10.N&V - in the morning of 01/13/2014, likely side effect of narcotic that he received for foot pain, or sore after supportive care tolerating diet for over 24 hours and symptom-free.    11. Right foot pain. X-ray stable. Uric acid 21, likely mild gout, no signs of effusion or erythema. Mild  tenderness on  exam. We'll place on colchicine with one dose of IV steroid after which is much improved, seen by orthopedic outpatient followup.      Today   Subjective:   [name] today has no headache,no chest abdominal pain,no new weakness tingling or numbness, feels much better wants to go home today.    Objective:   Blood pressure 105/95, pulse 76, temperature 98.2 F (36.8 C), temperature source Oral, resp. rate 18, height 5\' 8"  (1.727 m), weight 118.1 kg (260 lb 5.8 oz), SpO2 94.00%.   Intake/Output Summary (Last 24 hours) at 01/14/14 0935 Last data filed at 01/14/14 6222  Gross per 24 hour  Intake      0 ml  Output   2880 ml  Net  -2880 ml    Exam Awake Alert, Oriented x 3, No new F.N deficits, Normal affect [place].AT,PERRAL Supple Neck,No JVD, No cervical lymphadenopathy appriciated.  Symmetrical Chest wall movement, Good air movement bilaterally, CTAB RRR,No Gallops,Rubs or new Murmurs, No Parasternal Heave +ve B.Sounds, Abd Soft, Non tender, No organomegaly appriciated, No rebound -guarding or rigidity. No Cyanosis, Clubbing or edema, No new Rash or bruise  Data Review   CBC w Diff: Lab Results  Component Value Date   WBC 8.6 01/11/2014   WBC 7.1 02/04/2013   HGB 8.8* 01/12/2014   HGB 11.9* 02/04/2013   HCT 28.7* 01/12/2014   HCT 36.8* 02/04/2013   PLT 168 01/11/2014   PLT 116* 02/04/2013   LYMPHOPCT 15 01/10/2014   LYMPHOPCT 15.6 02/04/2013   MONOPCT 10 01/10/2014   MONOPCT 9.3 02/04/2013   EOSPCT 2 01/10/2014   EOSPCT 2.3 02/04/2013   BASOPCT 1 01/10/2014   BASOPCT 1.1 02/04/2013    CMP: Lab Results  Component Value Date   NA 135* 01/12/2014   NA 138 02/04/2013   K 4.3 01/13/2014   K 4.9 02/04/2013   CL 90* 01/12/2014   CL 102 08/21/2012   CO2 27 01/12/2014   CO2 27 02/04/2013   BUN 88* 01/12/2014   BUN 12.1 02/04/2013   CREATININE 2.01* 01/12/2014   CREATININE 1.1 02/04/2013   CREATININE 0.96 02/13/2012   PROT 8.1 08/01/2013   PROT 7.7 02/04/2013   ALBUMIN 4.1 08/01/2013    ALBUMIN 3.9 02/04/2013   BILITOT 1.0 08/01/2013   BILITOT 0.94 02/04/2013   ALKPHOS 77 08/01/2013   ALKPHOS 78 02/04/2013   AST 19 08/01/2013   AST 37* 02/04/2013   ALT 13 08/01/2013   ALT 39 02/04/2013  .  Results for POLK, MINOR (MRN [id number]) as of 01/14/2014 09:36  Ref. Range 01/10/2014 18:00  Iron Latest Range: 42-135 ug/dL 32 (L)  UIBC Latest Range: 125-400 ug/dL 483 (H)  TIBC Latest Range: 215-435 ug/dL 515 (H)  Saturation Ratios Latest Range: 20-55 % 6 (L)  Ferritin Latest Range: 22-322 ng/mL 12 (L)  Folate No range found >20.0     Total Time in preparing paper work, data evaluation and todays exam - 35 minutes  Thurnell Lose M.D on 01/14/2014 at 9:35 AM  [facility] Office  [phone]   **Disclaimer: This note may have been dictated with voice recognition software. Similar sounding words can inadvertently be transcribed and this note may contain transcription errors which may not have been corrected upon publication of note.**

## 2014-01-14 NOTE — Care Management Note (Signed)
    Page 1 of 1   01/14/2014     4:00:29 PM CARE MANAGEMENT NOTE 01/14/2014  Patient:  [name], [name]   Account Number:  [id number]  Date Initiated:  01/13/2014  Documentation initiated by:  Tomi Bamberger  Subjective/Objective Assessment:   dx gib  admit- lives with spouse.     Action/Plan:   Anticipated DC Date:  01/14/2014   Anticipated DC Plan:  HOME/SELF CARE      DC Planning Services  CM consult      Choice offered to / List presented to:             Status of service:  Completed, signed off Medicare Important Message given?  YES (If response is "NO", the following Medicare IM given date fields will be blank) Date Medicare IM given:  01/13/2014 Date Additional Medicare IM given:    Discharge Disposition:  HOME/SELF CARE  Per UR Regulation:  Reviewed for med. necessity/level of care/duration of stay  If discussed at [place] of Stay Meetings, dates discussed:    Comments:

## 2014-01-14 NOTE — Progress Notes (Signed)
NURSING PROGRESS NOTE  [name] [id number] Discharge Data: 01/14/2014 2:00 PM Attending Philemon Riedesel: Thurnell Lose, MD YSA:[id number], [name], MD     [name] to be D/C'd Home per MD order.  Discussed with the patient the After Visit Summary and all questions fully answered. All IV's discontinued with no bleeding noted. All belongings returned to patient for patient to take home.   Last Vital Signs:  Blood pressure 97/56, pulse 67, temperature 98.2 F (36.8 C), temperature source Oral, resp. rate 18, height 5\' 8"  (1.727 m), weight 118.1 kg (260 lb 5.8 oz), SpO2 94.00%.  Discharge Medication List   Medication List    STOP taking these medications       losartan 50 MG tablet  Commonly known as:  COZAAR     omeprazole 20 MG capsule  Commonly known as:  PRILOSEC      TAKE these medications       aspirin EC 325 MG tablet  Take 1 tablet (325 mg total) by mouth daily. Resume after 1 week once cleared by her stomach doctor Dr. Laural Golden  Start taking on:  01/21/2014     atorvastatin 20 MG tablet  Commonly known as:  LIPITOR  Take 20 mg by mouth every morning.     colchicine 0.6 MG tablet  Take 1 tablet (0.6 mg total) by mouth 2 (two) times daily.     CVS SPECTRAVITE ADULT 50+ PO  Take 1 tablet by mouth daily.     DSS 100 MG Caps  Take 200 mg by mouth 2 (two) times daily.     ferrous gluconate 325 MG tablet  Commonly known as:  FERGON  Take 325 mg by mouth 2 (two) times daily.     insulin NPH Human 100 UNIT/ML injection  Commonly known as:  HUMULIN N,NOVOLIN N  Inject 20-25 Units into the skin 2 (two) times daily before a meal.     insulin regular 100 units/mL injection  Commonly known as:  NOVOLIN R,HUMULIN R  Inject 15 Units into the skin 2 (two) times daily before a meal.     metFORMIN 1000 MG tablet  Commonly known as:  GLUCOPHAGE  Take 1,000 mg by mouth 2 (two) times daily with a meal.     metolazone 2.5 MG tablet  Commonly known as:  ZAROXOLYN  Take  2.5 mg by mouth every Monday, Wednesday, and Friday.     metoprolol succinate 100 MG 24 hr tablet  Commonly known as:  TOPROL-XL  Take 1 tablet (100 mg total) by mouth 2 (two) times daily. Take with or immediately following a meal.     pantoprazole 40 MG tablet  Commonly known as:  PROTONIX  Take 1 tablet (40 mg total) by mouth 2 (two) times daily.     potassium chloride 10 MEQ tablet  Commonly known as:  K-DUR,KLOR-CON  Take 40 mEq by mouth 2 (two) times daily.     torsemide 20 MG tablet  Commonly known as:  DEMADEX  Take 5 tablets (100 mg total) by mouth 2 (two) times daily.     verapamil 240 MG CR tablet  Commonly known as:  CALAN-SR  Take 1 tablet (240 mg total) by mouth at bedtime.     vitamin C 500 MG tablet  Commonly known as:  ASCORBIC ACID  Take 500 mg by mouth 2 (two) times daily.         Wallie Renshaw, RN

## 2014-01-16 ENCOUNTER — Ambulatory Visit (HOSPITAL_COMMUNITY)
Admission: RE | Admit: 2014-01-16 | Discharge: 2014-01-16 | Disposition: A | Discharge status: Home / Self Care | Payer: Medicare Other | Source: Ambulatory Visit | Attending: Internal Medicine | Admitting: Internal Medicine

## 2014-01-16 ENCOUNTER — Encounter (INDEPENDENT_AMBULATORY_CARE_PROVIDER_SITE_OTHER): Payer: Self-pay

## 2014-01-16 ENCOUNTER — Encounter (HOSPITAL_COMMUNITY): Payer: Self-pay

## 2014-01-16 VITALS — BP 130/52 | HR 115 | Wt 256.8 lb

## 2014-01-16 DIAGNOSIS — I509 Heart failure, unspecified: Secondary | ICD-10-CM

## 2014-01-16 DIAGNOSIS — K254 Chronic or unspecified gastric ulcer with hemorrhage: Secondary | ICD-10-CM

## 2014-01-16 DIAGNOSIS — I2510 Atherosclerotic heart disease of native coronary artery without angina pectoris: Secondary | ICD-10-CM | POA: Diagnosis not present

## 2014-01-16 DIAGNOSIS — I4891 Unspecified atrial fibrillation: Secondary | ICD-10-CM

## 2014-01-16 DIAGNOSIS — Z951 Presence of aortocoronary bypass graft: Secondary | ICD-10-CM

## 2014-01-16 DIAGNOSIS — D649 Anemia, unspecified: Secondary | ICD-10-CM | POA: Diagnosis not present

## 2014-01-16 DIAGNOSIS — I5032 Chronic diastolic (congestive) heart failure: Secondary | ICD-10-CM | POA: Diagnosis not present

## 2014-01-16 DIAGNOSIS — I4821 Permanent atrial fibrillation: Secondary | ICD-10-CM

## 2014-01-16 LAB — CBC
HEMATOCRIT: 33.7 % — AB (ref 39.0–52.0)
HEMOGLOBIN: 10.5 g/dL — AB (ref 13.0–17.0)
MCH: 27.1 pg (ref 26.0–34.0)
MCHC: 31.2 g/dL (ref 30.0–36.0)
MCV: 86.9 fL (ref 78.0–100.0)
Platelets: 186 10*3/uL (ref 150–400)
RBC: 3.88 MIL/uL — ABNORMAL LOW (ref 4.22–5.81)
RDW: 16.3 % — AB (ref 11.5–15.5)
WBC: 9 10*3/uL (ref 4.0–10.5)

## 2014-01-16 LAB — BASIC METABOLIC PANEL
BUN: 77 mg/dL — AB (ref 6–23)
CO2: 29 meq/L (ref 19–32)
CREATININE: 1.84 mg/dL — AB (ref 0.50–1.35)
Calcium: 10.1 mg/dL (ref 8.4–10.5)
Chloride: 89 mEq/L — ABNORMAL LOW (ref 96–112)
GFR calc non Af Amer: 37 mL/min — ABNORMAL LOW (ref >90)
GFR, EST AFRICAN AMERICAN: 43 mL/min — AB (ref >90)
Glucose, Bld: 136 mg/dL — ABNORMAL HIGH (ref 70–99)
POTASSIUM: 3.2 meq/L — AB (ref 3.7–5.3)
Sodium: 137 mEq/L (ref 137–147)

## 2014-01-16 MED ORDER — VERAPAMIL HCL ER 240 MG PO TBCR: 360.0000 mg | EXTENDED_RELEASE_TABLET | Freq: Every day | ORAL | Status: DC

## 2014-01-16 NOTE — Patient Instructions (Addendum)
Increase your verapamil to 360 mg (1 1/2 tablets) daily.  Will set up for Holter Monitor  Follow up in 1 month  Do the following things EVERYDAY: 1) Weigh yourself in the morning before breakfast. Write it down and keep it in a log. 2) Take your medicines as prescribed 3) Eat low salt foods-Limit salt (sodium) to 2000 mg per day.  4) Stay as active as you can everyday 5) Limit all fluids for the day to less than 2 liters 6)

## 2014-01-16 NOTE — Progress Notes (Signed)
Patient ID: [name], male   DOB: Aug 28, 1945, 68 y.o.   MRN: [id number] Primary Cardiologist: Dr. Burt Knack GI: Dr Laural Golden- followed for cirrohisis Pulmonary: Dr Melvyn Novas  PCP: Elesa Hacker [facility])   [name] is a 68 yo with history of CAD s/p CABG, permanent atrial fibrillation, chronic diastolic CHF, severe AS s/p bioprosthetic AVR presents for evaluation of diastolic CHF.  Last LHC in 8/13 showed patent grafts.  AVR was in 10/13.  Last echo in 8/14 showed EF 55-60%.    RHC (12/2013): RA 14 RV 44/4/11 PA 50/23 (33) PCWP 20, v = 35 Fick CO/CI: 6.0/2.6 PVR 2.2 WU PA 59%  Follow up for Heart Failure: Since last visit was admitted to the hospital for a/c iron deficiency anemia related to upper GI bleed. He received 2 PRBCs and had EGD confirming gastritis along with gastric polyp plus questionable GAVE. Overall feeling pretty good now. SOB is improved he is able to walk around the whole grocery store without stopping and could walk from parking garage to clinic. Denies PND, orthopnea or CP. Weight at home 252 lbs. Weight on our scales is down 15 lbs.  Following a low salt diet and drinking less than 2L a day.  His metolazone was increased to three times a week after the above RHC.  Creatinine most recently was higher at 2.   Labs (8/14) LDL 38 Labs (3/15) K 3.4, creatinine 1.7, BUN 55  Labs (10/15/13) K 4.0 Creatinine 1.6 BUN 32 Labs (10/29/13) K 4.0 Creatinine 1.6  Labs (4/15) K 4.2 Creatinine 1.32 => 1.5, HCT 35.2 Labs (12/11/13) K 3.6 Creatinine 1.56 Pro BNP 1152 Hemoglobin 9.6  Labs (01/12/14) K 3.0, creatinine 2.0, hemoglobin 8.8  PMH: 1. CAD: s/p CABG 2004. Last LHC (8/13) with patent free radial-OM, patent SVG-D, patent LIMA-LAD.  2. Permanent atrial fibrillation: Not anticoagulated due to GI bleeding.  Holter monitor (4/15) with mean HR 115 (afib).  3. Chronic diastolic CHF: Echo (0/10) with EF 55-60%, bioprosthetic aortic valve with mean gradient 22 mmHg, mildly decreased RV  systolic function. RHC (12/2013): RA 14, RV 44/4/11, PA 50/23 (33), PCWP 20, v = 35, Fick CO/CI: 6.0/2.6, PVR 2.2 WU, PA 59% 4. Severe aortic stenosis s/p bioprosthetic AVR in 10/13. Mean gradient 22 mmHg across valve on echo in 8/14.  5. NASH: ascites, thrombocytopenia.  Had paracentesis in 1/15. Gynecomastia with spironolactone.  6. 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  EGD in 1/15 showed mild portal gastropathy and 2 small antral ulcers. Recurrent GI bleed in 6/15, EGD showed gastritis and ?GAVE.  7. Hyperlipidemia 8. HTN 9. GERD 10. COPD 61. OSA: Cannot tolerate CPAP.  12. CKD 13. Gout  SH: Lives with wife in [place], prior smoker.   FH: CAD  ROS: All systems reviewed and negative except as per HPI.   Current Outpatient Prescriptions on File Prior to Encounter  Medication Sig Dispense Refill  . atorvastatin (LIPITOR) 20 MG tablet Take 20 mg by mouth every morning.       . colchicine 0.6 MG tablet Take 1 tablet (0.6 mg total) by mouth 2 (two) times daily.  60 tablet  0  . docusate sodium 100 MG CAPS Take 200 mg by mouth 2 (two) times daily.  20 capsule  0  . ferrous gluconate (FERGON) 325 MG tablet Take 325 mg by mouth 2 (two) times daily.       . insulin NPH Human (HUMULIN N,NOVOLIN N) 100 UNIT/ML injection Inject 20-25 Units into  the skin 2 (two) times daily before a meal.      . insulin regular (NOVOLIN R,HUMULIN R) 100 units/mL injection Inject 15 Units into the skin 2 (two) times daily before a meal.       . metFORMIN (GLUCOPHAGE) 1000 MG tablet Take 1,000 mg by mouth 2 (two) times daily with a meal.       . metolazone (ZAROXOLYN) 2.5 MG tablet Take 2.5 mg by mouth every Monday, Wednesday, and Friday.      . metoprolol succinate (TOPROL-XL) 100 MG 24 hr tablet Take 1 tablet (100 mg total) by mouth 2 (two) times daily. Take with or immediately following a meal.  60 tablet  6  . Multiple Vitamins-Minerals (CVS SPECTRAVITE ADULT 50+ PO) Take 1 tablet by mouth daily.      .  pantoprazole (PROTONIX) 40 MG tablet Take 1 tablet (40 mg total) by mouth 2 (two) times daily.  60 tablet  0  . potassium chloride (K-DUR,KLOR-CON) 10 MEQ tablet Take 40 mEq by mouth 2 (two) times daily.      [name] torsemide (DEMADEX) 20 MG tablet Take 5 tablets (100 mg total) by mouth 2 (two) times daily.  210 tablet  3  . verapamil (CALAN-SR) 240 MG CR tablet Take 1 tablet (240 mg total) by mouth at bedtime.  30 tablet  6  . vitamin C (ASCORBIC ACID) 500 MG tablet Take 500 mg by mouth 2 (two) times daily.       No current facility-administered medications on file prior to encounter.    Filed Vitals:   01/16/14 1542  BP: 130/52  Pulse: 115  Weight: 256 lb 12.8 oz (116.484 kg)  SpO2: 95%    General: NAD Neck: JVP 7, no thyromegaly or thyroid nodule. Wife present  Lungs: Clear to auscultation bilaterally with normal respiratory effort. CV: Nondisplaced PMI.  Tachy, irregular S1/S2, no S3/S4, 2/6 early SEM RUSB.  No peripheral edema.  No carotid bruit.  Normal pedal pulses.  Abdomen: Soft, nontender, no hepatosplenomegaly, + distended.  Skin: Intact without lesions or rashes.  Neurologic: Alert and oriented x 3.  Psych: Normal affect. Extremities: No clubbing or cyanosis.   Assessment/Plan: 1. Chronic diastolic CHF:  EF 78-58% (02/2013).  Since last visit had RHC showing elevated right and left heart filling pressures. His metolazone was increased to M,W,F. Volume status much better and weight is down about 15 lbs. However, creatinine was increased to 2.0 (also in setting of GI bleed).  Will continue torsemide 100 mg BID with metolazone 2.5 mg M,W,F. Check BMET today. 2. Chronic atrial fibrillation:  CHADSVASC 3 - CHF, HTN, DM2. 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 HR 110s again despite Toprol XL 100 mg bid and Verapamil 240 mg daily. Will increase verapamil to 360 mg daily. We will also get a holter monitor to assess average HR.  Suspect  elevated heart rate is contributing to CHF.  He does not have many good options here is medication will not work.  He cannot be cardioverted safely as he cannot be on anticoagulation with recurrent GI bleeds.  AV nodal ablation and pacing would probably be the only other option.  If Holter shows high HR average after increasing verapamil, will refer to EP.   3. CKD: Will need to follow closely with diuresis.  Following creatinine as above.  4. Bioprosthetic AVR: Probably mild prosthetic valvular stenosis based on most recent echo.  5.  CAD: s/p CABG. No chest pain.  Continue atorvastatin.  He has been off aspirin and anticoagulation with history of GI bleeding, most recently earlier this month.   6. Cirrhosis: NASH-related. Has followup in August Per Dr Laural Golden 7. Anemia: Just readmitted with GI bleed. No longer on ASA. Will check CBC today.      Follow up 1 month  Junie Bame B,NP-C 4:05 PM  Patient seen with NP, agree with the above note.  Weight is down and volume status improved, but creatinine is up (also in setting of GI bleeding). I will repeat creatinine today and continue same diuretic regimen for now.  HR still in the 110s. Suspect this may contribute to worsening CHF.  Will increase verapamil CR and get holter monitor.  In absence of ability to anticoagulate (recurrent GI bleeds), ablation/pacing is the only other option.   [name] 01/17/2014

## 2014-01-17 ENCOUNTER — Other Ambulatory Visit: Payer: Self-pay | Admitting: Cardiovascular Disease

## 2014-01-17 ENCOUNTER — Telehealth (HOSPITAL_COMMUNITY): Payer: Self-pay | Admitting: Anesthesiology

## 2014-01-17 MED ORDER — POTASSIUM CHLORIDE CRYS ER 10 MEQ PO TBCR: 60.0000 meq | EXTENDED_RELEASE_TABLET | Freq: Two times a day (BID) | ORAL | Status: DC

## 2014-01-17 NOTE — Telephone Encounter (Signed)
Reviewed labs from yesterday and K+ 3.2. Told patient to take an extra 60 meq today. Patient has had hypokalemia for awhile will increase daily potassium to 60 meq BID. Patient aware.   Recheck in 1 month  Rande Brunt NP_C 4:22 PM

## 2014-01-21 DIAGNOSIS — D62 Acute posthemorrhagic anemia: Secondary | ICD-10-CM | POA: Diagnosis not present

## 2014-01-21 DIAGNOSIS — E876 Hypokalemia: Secondary | ICD-10-CM | POA: Diagnosis not present

## 2014-01-22 ENCOUNTER — Encounter (INDEPENDENT_AMBULATORY_CARE_PROVIDER_SITE_OTHER): Payer: Self-pay | Admitting: Internal Medicine

## 2014-01-22 ENCOUNTER — Ambulatory Visit (INDEPENDENT_AMBULATORY_CARE_PROVIDER_SITE_OTHER): Payer: Medicare Other | Admitting: Internal Medicine

## 2014-01-22 VITALS — BP 90/60 | HR 104 | Temp 97.7°F | Ht 68.0 in | Wt 262.9 lb

## 2014-01-22 DIAGNOSIS — I25810 Atherosclerosis of coronary artery bypass graft(s) without angina pectoris: Secondary | ICD-10-CM | POA: Diagnosis not present

## 2014-01-22 DIAGNOSIS — K921 Melena: Secondary | ICD-10-CM

## 2014-01-22 DIAGNOSIS — D62 Acute posthemorrhagic anemia: Secondary | ICD-10-CM

## 2014-01-22 NOTE — Patient Instructions (Signed)
OV in 3 months with Dr Laural Golden.  CBC, Hepatic profile in 2 weeks.

## 2014-01-22 NOTE — Progress Notes (Signed)
Subjective:     Patient ID: [name], male   DOB: [birth year], 68 y.o.   MRN: [id number]  HPI Here today for f/u after recent admission to [facility] in June  for upper GI bleed. He was having black stools. Hx of melena in the past.  He was admitted x 5 days. He received 2 units of blood. He has been off the ASA 367m since [facility]  He tells me he says he feels good. Appetite is good. No weight loss.  He says he did have one black stool 2 days ago 01/20/2014. His stool today was dark brown. Presently taking iron twice a day for his iron deficiency anemia.  01/20/2014  Hemoglobin 9.4 at Dr MOlen Pel General Practitioner per patient. 01/16/2014 Hemoglobin 10.5. 01/10/2014 Hemoglobin 6.7 6/201/2015 EGD: Dr. MCollene Mares Melena. Antral gastritis. ? GAVE and isolated gastric polyp in the mid body. No fresh or old heme in upper GI.  He has had multiple EGD in the past for upper GI bleeds. EGD in January of this year for melena. Hx of NAFLD. He underwent a paracentesis in January of this year with removal of 15031mof fluid.    CBC    Component Value Date/Time   WBC 9.0 01/16/2014 1616   WBC 7.1 02/04/2013 0948   RBC 3.88* 01/16/2014 1616   RBC 2.45* 01/10/2014 1800   RBC 4.28 02/04/2013 0948   HGB 10.5* 01/16/2014 1616   HGB 11.9* 02/04/2013 0948   HCT 33.7* 01/16/2014 1616   HCT 36.8* 02/04/2013 0948   PLT 186 01/16/2014 1616   PLT 116* 02/04/2013 0948   MCV 86.9 01/16/2014 1616   MCV 86.1 02/04/2013 0948   MCH 27.1 01/16/2014 1616   MCH 27.8 02/04/2013 0948   MCHC 31.2 01/16/2014 1616   MCHC 32.3 02/04/2013 0948   RDW 16.3* 01/16/2014 1616   RDW 17.4* 02/04/2013 0948   LYMPHSABS 1.3 01/10/2014 1720   LYMPHSABS 1.1 02/04/2013 0948   MONOABS 0.9 01/10/2014 1720   MONOABS 0.7 02/04/2013 0948   EOSABS 0.2 01/10/2014 1720   EOSABS 0.2 02/04/2013 0948   BASOSABS 0.1 01/10/2014 1720   BASOSABS 0.1 02/04/2013 0948   Hepatic Function Latest Ref Rng 08/01/2013 07/31/2013 07/30/2013  Total Protein 6.0 - 8.3 g/dL  8.1 7.2 7.6  Albumin 3.5 - 5.2 g/dL 4.1 3.8 3.9  AST 0 - 37 U/L 19 18 18   ALT 0 - 53 U/L 13 12 13   Alk Phosphatase 39 - 117 U/L 77 73 72  Total Bilirubin 0.3 - 1.2 mg/dL 1.0 1.0 0.8  Bilirubin, Direct 0.0 - 0.3 mg/dL - - -   CMP     Component Value Date/Time   NA 137 01/16/2014 1616   NA 138 02/04/2013 0949   K 3.2* 01/16/2014 1616   K 4.9 02/04/2013 0949   CL 89* 01/16/2014 1616   CL 102 08/21/2012 0838   CO2 29 01/16/2014 1616   CO2 27 02/04/2013 0949   GLUCOSE 136* 01/16/2014 1616   GLUCOSE 234* 02/04/2013 0949   GLUCOSE 170* 08/21/2012 0838   BUN 77* 01/16/2014 1616   BUN 12.1 02/04/2013 0949   CREATININE 1.84* 01/16/2014 1616   CREATININE 1.1 02/04/2013 0949   CREATININE 0.96 02/13/2012 1051   CALCIUM 10.1 01/16/2014 1616   CALCIUM 9.6 02/04/2013 0949   PROT 8.1 08/01/2013 0240   PROT 7.7 02/04/2013 0949   ALBUMIN 4.1 08/01/2013 0240   ALBUMIN 3.9 02/04/2013 0949   AST 19 08/01/2013 0240  AST 37* 02/04/2013 0949   ALT 13 08/01/2013 0240   ALT 39 02/04/2013 0949   ALKPHOS 77 08/01/2013 0240   ALKPHOS 78 02/04/2013 0949   BILITOT 1.0 08/01/2013 0240   BILITOT 0.94 02/04/2013 0949   GFRNONAA 37* 01/16/2014 1616   GFRAA 43* 01/16/2014 1616       Review of Systems Past Medical History  Diagnosis Date  . Overweight(278.02)   . CAD (coronary artery disease)     a. s/p CABG 2004;  b. [place] 10/13:  LHC 8/13: Free radial to obtuse marginal patent, SVG-diagonal patent, LIMA-LAD patent, EF 65-70%, mean aortic valve gradient 42  . Atrial fibrillation     Permanent; off of Coumadin for now due to GI bleed  . DM2 (diabetes mellitus, type 2)   . Insomnia   . Carotid bruit 06/14/2011    a. pre-AVR dopplers 10/13: no sig ICA stenosis  . Hypertension   . Chronic diastolic heart failure   . GERD (gastroesophageal reflux disease)   . COPD (chronic obstructive pulmonary disease)   . Iron deficiency anemia     Requiring intravenous iron  . H/O hiatal hernia   . AVM (arteriovenous malformation)      Recurrent GI bleeding requiring multiple transfusions  . Hyperlipidemia   . Thrombocytopenia   . Ascites     status post paracentesis with removal of 3.4 L of ascitic fluid  . Osteoarthritis   . Mediastinal adenopathy 09/22/2011  . Cirrhosis   . Iron deficiency anemia   . Mediastinal adenopathy 09/22/2011  . Aortic stenosis 03/08/2012    a.  s/p tissue AVR 10/13 with Dr. [name];   b. Echo 10/13: mod LVH, EF 55-60%, tissue AVR not well seen, no leak, gradient not too high (mean 28mHg), MAC, mild MR, mild LAE, PASP 38  . OSA (obstructive sleep apnea) 1999    DOES NOT USE CPAP    Past Surgical History  Procedure Laterality Date  . Coronary artery bypass graft   10/15/2002     SRevonda Standard HRoxan Hockey M.D.     . Carpal tunnel release    10/08/2003  . Lipoma surgery    . Hernia repair    . Other [facility] history  [facility] , revealing "three-four AVM[facility]."   . Tee without cardioversion  03/07/2012    Procedure: TRANSESOPHAGEAL ECHOCARDIOGRAM (TEE);  Surgeon: PThayer Headings MD;  [facility];  Service: Cardiovascular;  Laterality: N/A;  . Coronary angioplasty with stent placement  01/19/2005    drug eluting stent to high grade ostial stenosis of radial artery graft to OM  . Aortic valve replacement  04/25/2012    Procedure: AORTIC VALVE REPLACEMENT (AVR);  Surgeon: SMelrose Nakayama MD;  [place]  Service: Open Heart Surgery;  Laterality: N/A;  . Esophagogastroduodenoscopy N/A 07/31/2013    Procedure: ESOPHAGOGASTRODUODENOSCOPY (EGD);  Surgeon: DMilus Banister MD;  [place]  Service: Endoscopy;  Laterality: N/A;  . Esophagogastroduodenoscopy Left 01/11/2014    Procedure: ESOPHAGOGASTRODUODENOSCOPY (EGD);  Surgeon: JJuanita Craver MD;  [facility];  Service: Endoscopy;  Laterality: Left;    Allergies  Allergen Reactions  . Codeine Other (See Comments)    Hurting in chest  . Diltiazem Hcl Itching  . Niacin Other (See  Comments)    Hot flashes    Current Outpatient Prescriptions on File Prior to Visit  Medication Sig Dispense Refill  . atorvastatin (LIPITOR) 20 MG tablet Take 20 mg by mouth  every morning.       . colchicine 0.6 MG tablet Take 1 tablet (0.6 mg total) by mouth 2 (two) times daily.  60 tablet  0  . docusate sodium 100 MG CAPS Take 200 mg by mouth 2 (two) times daily.  20 capsule  0  . ferrous gluconate (FERGON) 325 MG tablet Take 325 mg by mouth 2 (two) times daily.       . insulin NPH Human (HUMULIN N,NOVOLIN N) 100 UNIT/ML injection Inject 20-25 Units into the skin 2 (two) times daily before a meal.      . insulin regular (NOVOLIN R,HUMULIN R) 100 units/mL injection Inject 15 Units into the skin 2 (two) times daily before a meal.       . KLOR-CON 10 10 MEQ tablet TAKE 3 TABLETS BY MOUTH TWICE A DAY  180 tablet  0  . losartan (COZAAR) 50 MG tablet Take 50 mg by mouth daily.      . metFORMIN (GLUCOPHAGE) 1000 MG tablet Take 1,000 mg by mouth 2 (two) times daily with a meal.       . metolazone (ZAROXOLYN) 2.5 MG tablet Take 2.5 mg by mouth every Monday, Wednesday, and Friday.      . metoprolol succinate (TOPROL-XL) 100 MG 24 hr tablet Take 1 tablet (100 mg total) by mouth 2 (two) times daily. Take with or immediately following a meal.  60 tablet  6  . Multiple Vitamins-Minerals (CVS SPECTRAVITE ADULT 50+ PO) Take 1 tablet by mouth daily.      . pantoprazole (PROTONIX) 40 MG tablet Take 1 tablet (40 mg total) by mouth 2 (two) times daily.  60 tablet  0  . torsemide (DEMADEX) 20 MG tablet Take 5 tablets (100 mg total) by mouth 2 (two) times daily.  210 tablet  3  . verapamil (CALAN-SR) 240 MG CR tablet Take 1.5 tablets (360 mg total) by mouth at bedtime.  45 tablet  6  . vitamin C (ASCORBIC ACID) 500 MG tablet Take 500 mg by mouth 2 (two) times daily.      . potassium chloride (K-DUR,KLOR-CON) 10 MEQ tablet Take 6 tablets (60 mEq total) by mouth 2 (two) times daily.  360 tablet  3   No current  facility-administered medications on file prior to visit.        Objective:   Physical Exam  Filed Vitals:   01/22/14 1032  BP: 90/60  Pulse: 104  Temp: 97.7 F (36.5 C)  Height: 5' 8"  (1.727 m)  Weight: 262 lb 14.4 oz (119.251 kg)   Alert and oriented. Skin warm and dry. Oral mucosa is moist.   . Sclera anicteric, conjunctivae is pink. Thyroid not enlarged. No cervical lymphadenopathy. Lungs clear. Heart regular rate and rhythm.  Abdomen is soft. Bowel sounds are positive. No hepatomegaly. No abdominal masses felt. No tenderness.  No edema to lower extremities.  Rectal exam deferred     Assessment:     Melena. Hx of same. Recent admission to [facility] for same. Last hemoglobin yesterday at Dr. Olen Pel 9.4.   Cirrhosis secondary to NAFLD.  Liver enzymes have been good.     Plan:     CBC, Hepatic profile in 2 weeks. OV in 3 months with Dr. Laural Golden.

## 2014-01-27 ENCOUNTER — Ambulatory Visit: Payer: Managed Care, Other (non HMO)

## 2014-01-27 ENCOUNTER — Other Ambulatory Visit: Payer: Managed Care, Other (non HMO) | Admitting: Lab

## 2014-01-28 ENCOUNTER — Inpatient Hospital Stay (HOSPITAL_COMMUNITY)
Admission: EM | Admit: 2014-01-28 | Discharge: 2014-02-01 | DRG: 378 | Disposition: A | Discharge status: Home / Self Care | Payer: Medicare Other | Attending: Internal Medicine | Admitting: Internal Medicine

## 2014-01-28 ENCOUNTER — Encounter (HOSPITAL_COMMUNITY): Payer: Self-pay | Admitting: Emergency Medicine

## 2014-01-28 DIAGNOSIS — Z6841 Body Mass Index (BMI) 40.0 and over, adult: Secondary | ICD-10-CM

## 2014-01-28 DIAGNOSIS — E119 Type 2 diabetes mellitus without complications: Secondary | ICD-10-CM | POA: Diagnosis present

## 2014-01-28 DIAGNOSIS — D131 Benign neoplasm of stomach: Secondary | ICD-10-CM | POA: Diagnosis present

## 2014-01-28 DIAGNOSIS — Z952 Presence of prosthetic heart valve: Secondary | ICD-10-CM | POA: Diagnosis not present

## 2014-01-28 DIAGNOSIS — N183 Chronic kidney disease, stage 3 unspecified: Secondary | ICD-10-CM | POA: Diagnosis present

## 2014-01-28 DIAGNOSIS — Z951 Presence of aortocoronary bypass graft: Secondary | ICD-10-CM

## 2014-01-28 DIAGNOSIS — I5033 Acute on chronic diastolic (congestive) heart failure: Secondary | ICD-10-CM | POA: Diagnosis not present

## 2014-01-28 DIAGNOSIS — E785 Hyperlipidemia, unspecified: Secondary | ICD-10-CM | POA: Diagnosis present

## 2014-01-28 DIAGNOSIS — I129 Hypertensive chronic kidney disease with stage 1 through stage 4 chronic kidney disease, or unspecified chronic kidney disease: Secondary | ICD-10-CM | POA: Diagnosis present

## 2014-01-28 DIAGNOSIS — J449 Chronic obstructive pulmonary disease, unspecified: Secondary | ICD-10-CM | POA: Diagnosis present

## 2014-01-28 DIAGNOSIS — Z954 Presence of other heart-valve replacement: Secondary | ICD-10-CM | POA: Diagnosis not present

## 2014-01-28 DIAGNOSIS — I959 Hypotension, unspecified: Secondary | ICD-10-CM | POA: Diagnosis not present

## 2014-01-28 DIAGNOSIS — N179 Acute kidney failure, unspecified: Secondary | ICD-10-CM | POA: Diagnosis present

## 2014-01-28 DIAGNOSIS — N189 Chronic kidney disease, unspecified: Secondary | ICD-10-CM

## 2014-01-28 DIAGNOSIS — Z794 Long term (current) use of insulin: Secondary | ICD-10-CM

## 2014-01-28 DIAGNOSIS — I5032 Chronic diastolic (congestive) heart failure: Secondary | ICD-10-CM | POA: Diagnosis not present

## 2014-01-28 DIAGNOSIS — K922 Gastrointestinal hemorrhage, unspecified: Secondary | ICD-10-CM | POA: Diagnosis not present

## 2014-01-28 DIAGNOSIS — I10 Essential (primary) hypertension: Secondary | ICD-10-CM | POA: Diagnosis not present

## 2014-01-28 DIAGNOSIS — I509 Heart failure, unspecified: Secondary | ICD-10-CM | POA: Diagnosis not present

## 2014-01-28 DIAGNOSIS — K219 Gastro-esophageal reflux disease without esophagitis: Secondary | ICD-10-CM | POA: Diagnosis present

## 2014-01-28 DIAGNOSIS — K7460 Unspecified cirrhosis of liver: Secondary | ICD-10-CM | POA: Diagnosis present

## 2014-01-28 DIAGNOSIS — J4489 Other specified chronic obstructive pulmonary disease: Secondary | ICD-10-CM | POA: Diagnosis present

## 2014-01-28 DIAGNOSIS — I350 Nonrheumatic aortic (valve) stenosis: Secondary | ICD-10-CM

## 2014-01-28 DIAGNOSIS — K921 Melena: Principal | ICD-10-CM | POA: Diagnosis present

## 2014-01-28 DIAGNOSIS — D696 Thrombocytopenia, unspecified: Secondary | ICD-10-CM | POA: Diagnosis present

## 2014-01-28 DIAGNOSIS — Z87891 Personal history of nicotine dependence: Secondary | ICD-10-CM | POA: Diagnosis not present

## 2014-01-28 DIAGNOSIS — D62 Acute posthemorrhagic anemia: Secondary | ICD-10-CM | POA: Diagnosis present

## 2014-01-28 DIAGNOSIS — D649 Anemia, unspecified: Secondary | ICD-10-CM | POA: Diagnosis not present

## 2014-01-28 DIAGNOSIS — E669 Obesity, unspecified: Secondary | ICD-10-CM | POA: Diagnosis present

## 2014-01-28 DIAGNOSIS — I359 Nonrheumatic aortic valve disorder, unspecified: Secondary | ICD-10-CM | POA: Diagnosis not present

## 2014-01-28 DIAGNOSIS — I2510 Atherosclerotic heart disease of native coronary artery without angina pectoris: Secondary | ICD-10-CM | POA: Diagnosis present

## 2014-01-28 DIAGNOSIS — R5381 Other malaise: Secondary | ICD-10-CM | POA: Diagnosis not present

## 2014-01-28 DIAGNOSIS — I4891 Unspecified atrial fibrillation: Secondary | ICD-10-CM | POA: Diagnosis present

## 2014-01-28 DIAGNOSIS — R5383 Other fatigue: Secondary | ICD-10-CM | POA: Diagnosis not present

## 2014-01-28 LAB — COMPREHENSIVE METABOLIC PANEL
ALT: 42 U/L (ref 0–53)
ANION GAP: 19 — AB (ref 5–15)
AST: 32 U/L (ref 0–37)
Albumin: 3.7 g/dL (ref 3.5–5.2)
Alkaline Phosphatase: 77 U/L (ref 39–117)
BUN: 71 mg/dL — ABNORMAL HIGH (ref 6–23)
CO2: 28 meq/L (ref 19–32)
CREATININE: 2.25 mg/dL — AB (ref 0.50–1.35)
Calcium: 9.3 mg/dL (ref 8.4–10.5)
Chloride: 91 mEq/L — ABNORMAL LOW (ref 96–112)
GFR, EST AFRICAN AMERICAN: 33 mL/min — AB (ref >90)
GFR, EST NON AFRICAN AMERICAN: 29 mL/min — AB (ref >90)
GLUCOSE: 200 mg/dL — AB (ref 70–99)
Potassium: 3.6 mEq/L — ABNORMAL LOW (ref 3.7–5.3)
SODIUM: 138 meq/L (ref 137–147)
TOTAL PROTEIN: 7.3 g/dL (ref 6.0–8.3)
Total Bilirubin: 0.6 mg/dL (ref 0.3–1.2)

## 2014-01-28 LAB — CBC
HCT: 24.9 % — ABNORMAL LOW (ref 39.0–52.0)
HEMOGLOBIN: 7.6 g/dL — AB (ref 13.0–17.0)
MCH: 27.6 pg (ref 26.0–34.0)
MCHC: 30.5 g/dL (ref 30.0–36.0)
MCV: 90.5 fL (ref 78.0–100.0)
Platelets: 143 10*3/uL — ABNORMAL LOW (ref 150–400)
RBC: 2.75 MIL/uL — AB (ref 4.22–5.81)
RDW: 18.2 % — ABNORMAL HIGH (ref 11.5–15.5)
WBC: 4.8 10*3/uL (ref 4.0–10.5)

## 2014-01-28 MED ORDER — SODIUM CHLORIDE 0.9 % IV BOLUS (SEPSIS): 1000.0000 mL | Freq: Once | INTRAVENOUS | Status: DC

## 2014-01-28 NOTE — ED Notes (Signed)
Pt has hx of a fib

## 2014-01-28 NOTE — ED Notes (Signed)
Sent from doctor with Hgb of 7.6-reports one episode of blood in stool. Last seen 3 days ago-now having dark black stools. Reports weakness and lack of energy.

## 2014-01-29 ENCOUNTER — Encounter (HOSPITAL_COMMUNITY): Payer: Self-pay | Admitting: Internal Medicine

## 2014-01-29 DIAGNOSIS — N179 Acute kidney failure, unspecified: Secondary | ICD-10-CM | POA: Diagnosis not present

## 2014-01-29 DIAGNOSIS — D649 Anemia, unspecified: Secondary | ICD-10-CM | POA: Diagnosis not present

## 2014-01-29 DIAGNOSIS — K921 Melena: Secondary | ICD-10-CM | POA: Diagnosis not present

## 2014-01-29 DIAGNOSIS — N189 Chronic kidney disease, unspecified: Secondary | ICD-10-CM

## 2014-01-29 DIAGNOSIS — I4891 Unspecified atrial fibrillation: Secondary | ICD-10-CM

## 2014-01-29 DIAGNOSIS — K922 Gastrointestinal hemorrhage, unspecified: Secondary | ICD-10-CM | POA: Diagnosis present

## 2014-01-29 LAB — GLUCOSE, CAPILLARY
GLUCOSE-CAPILLARY: 193 mg/dL — AB (ref 70–99)
GLUCOSE-CAPILLARY: 198 mg/dL — AB (ref 70–99)
Glucose-Capillary: 155 mg/dL — ABNORMAL HIGH (ref 70–99)
Glucose-Capillary: 190 mg/dL — ABNORMAL HIGH (ref 70–99)
Glucose-Capillary: 157 mg/dL — ABNORMAL HIGH (ref 70–99)

## 2014-01-29 LAB — MRSA PCR SCREENING: MRSA by PCR: NEGATIVE

## 2014-01-29 LAB — PREPARE RBC (CROSSMATCH)

## 2014-01-29 LAB — COMPREHENSIVE METABOLIC PANEL
ALK PHOS: 78 U/L (ref 39–117)
ALT: 47 U/L (ref 0–53)
ANION GAP: 19 — AB (ref 5–15)
AST: 44 U/L — ABNORMAL HIGH (ref 0–37)
Albumin: 3.8 g/dL (ref 3.5–5.2)
BILIRUBIN TOTAL: 3.1 mg/dL — AB (ref 0.3–1.2)
BUN: 65 mg/dL — AB (ref 6–23)
CHLORIDE: 91 meq/L — AB (ref 96–112)
CO2: 30 mEq/L (ref 19–32)
Calcium: 9.5 mg/dL (ref 8.4–10.5)
Creatinine, Ser: 1.91 mg/dL — ABNORMAL HIGH (ref 0.50–1.35)
GFR calc Af Amer: 41 mL/min — ABNORMAL LOW (ref >90)
GFR calc non Af Amer: 35 mL/min — ABNORMAL LOW (ref >90)
Glucose, Bld: 209 mg/dL — ABNORMAL HIGH (ref 70–99)
Potassium: 3.4 mEq/L — ABNORMAL LOW (ref 3.7–5.3)
Sodium: 140 mEq/L (ref 137–147)
Total Protein: 7.3 g/dL (ref 6.0–8.3)

## 2014-01-29 LAB — CBC WITH DIFFERENTIAL/PLATELET
Basophils Absolute: 0 10*3/uL (ref 0.0–0.1)
Basophils Relative: 0 % (ref 0–1)
Eosinophils Absolute: 0.1 10*3/uL (ref 0.0–0.7)
Eosinophils Relative: 1 % (ref 0–5)
HCT: 29.1 % — ABNORMAL LOW (ref 39.0–52.0)
Hemoglobin: 9 g/dL — ABNORMAL LOW (ref 13.0–17.0)
LYMPHS ABS: 0.6 10*3/uL — AB (ref 0.7–4.0)
Lymphocytes Relative: 11 % — ABNORMAL LOW (ref 12–46)
MCH: 27.4 pg (ref 26.0–34.0)
MCHC: 30.9 g/dL (ref 30.0–36.0)
MCV: 88.7 fL (ref 78.0–100.0)
MONOS PCT: 11 % (ref 3–12)
Monocytes Absolute: 0.6 10*3/uL (ref 0.1–1.0)
Neutro Abs: 4.1 10*3/uL (ref 1.7–7.7)
Neutrophils Relative %: 77 % (ref 43–77)
Platelets: 130 10*3/uL — ABNORMAL LOW (ref 150–400)
RBC: 3.28 MIL/uL — AB (ref 4.22–5.81)
RDW: 17.5 % — ABNORMAL HIGH (ref 11.5–15.5)
WBC: 5.4 10*3/uL (ref 4.0–10.5)

## 2014-01-29 LAB — OCCULT BLOOD, POC DEVICE
FECAL OCCULT BLD: POSITIVE — AB
Fecal Occult Bld: POSITIVE — AB

## 2014-01-29 LAB — CBG MONITORING, ED: Glucose-Capillary: 162 mg/dL — ABNORMAL HIGH (ref 70–99)

## 2014-01-29 MED ORDER — DEXTROSE 5 % IV SOLN
1.0000 g | INTRAVENOUS | Status: DC
  Administered 2014-01-29: 1 g via INTRAVENOUS

## 2014-01-29 MED ORDER — METOPROLOL SUCCINATE ER 100 MG PO TB24
100.0000 mg | ORAL_TABLET | Freq: Two times a day (BID) | ORAL | Status: DC
  Administered 2014-01-29 – 2014-02-01 (×7): 100 mg via ORAL
  Filled 2014-01-29 (×10): qty 1

## 2014-01-29 MED ORDER — SODIUM CHLORIDE 0.9 % IV BOLUS (SEPSIS): 500.0000 mL | Freq: Once | INTRAVENOUS | Status: DC

## 2014-01-29 MED ORDER — SODIUM CHLORIDE 0.9 % IJ SOLN
3.0000 mL | Freq: Two times a day (BID) | INTRAMUSCULAR | Status: DC
  Administered 2014-01-29 – 2014-01-31 (×5): 3 mL via INTRAVENOUS

## 2014-01-29 MED ORDER — FERROUS GLUCONATE 324 (38 FE) MG PO TABS
325.0000 mg | ORAL_TABLET | Freq: Two times a day (BID) | ORAL | Status: DC
  Administered 2014-01-29: 325 mg via ORAL
  Filled 2014-01-29 (×4): qty 1

## 2014-01-29 MED ORDER — ONDANSETRON HCL 4 MG PO TABS: 4.0000 mg | ORAL_TABLET | Freq: Four times a day (QID) | ORAL | Status: DC | PRN

## 2014-01-29 MED ORDER — PANTOPRAZOLE SODIUM 40 MG IV SOLR
40.0000 mg | Freq: Two times a day (BID) | INTRAVENOUS | Status: DC
  Filled 2014-01-29 (×2): qty 40

## 2014-01-29 MED ORDER — POTASSIUM CHLORIDE CRYS ER 20 MEQ PO TBCR
40.0000 meq | EXTENDED_RELEASE_TABLET | Freq: Once | ORAL | Status: AC
  Administered 2014-01-29: 40 meq via ORAL
  Filled 2014-01-29: qty 2

## 2014-01-29 MED ORDER — METOPROLOL TARTRATE 1 MG/ML IV SOLN
2.5000 mg | INTRAVENOUS | Status: DC | PRN
  Administered 2014-01-29 (×3): 5 mg via INTRAVENOUS
  Filled 2014-01-29 (×3): qty 5

## 2014-01-29 MED ORDER — ACETAMINOPHEN 325 MG PO TABS
650.0000 mg | ORAL_TABLET | Freq: Four times a day (QID) | ORAL | Status: DC | PRN
  Administered 2014-01-30: 650 mg via ORAL
  Filled 2014-01-29: qty 2

## 2014-01-29 MED ORDER — SODIUM CHLORIDE 0.9 % IV BOLUS (SEPSIS)
250.0000 mL | Freq: Once | INTRAVENOUS | Status: AC
  Administered 2014-01-29: 250 mL via INTRAVENOUS

## 2014-01-29 MED ORDER — SODIUM CHLORIDE 0.9 % IV SOLN
8.0000 mg/h | INTRAVENOUS | Status: AC
  Administered 2014-01-29 – 2014-01-31 (×5): 8 mg/h via INTRAVENOUS
  Filled 2014-01-29 (×12): qty 80

## 2014-01-29 MED ORDER — TORSEMIDE 20 MG PO TABS
60.0000 mg | ORAL_TABLET | Freq: Two times a day (BID) | ORAL | Status: DC
  Administered 2014-01-29: 60 mg via ORAL
  Filled 2014-01-29 (×4): qty 3

## 2014-01-29 MED ORDER — SODIUM CHLORIDE 0.9 % IV SOLN
80.0000 mg | Freq: Once | INTRAVENOUS | Status: AC
  Administered 2014-01-29: 80 mg via INTRAVENOUS
  Filled 2014-01-29: qty 80

## 2014-01-29 MED ORDER — INSULIN ASPART 100 UNIT/ML ~~LOC~~ SOLN
0.0000 [IU] | Freq: Three times a day (TID) | SUBCUTANEOUS | Status: DC
  Administered 2014-01-29 – 2014-01-30 (×6): 2 [IU] via SUBCUTANEOUS
  Administered 2014-01-31 (×2): 3 [IU] via SUBCUTANEOUS
  Administered 2014-01-31: 2 [IU] via SUBCUTANEOUS
  Administered 2014-02-01: 1 [IU] via SUBCUTANEOUS

## 2014-01-29 MED ORDER — VERAPAMIL HCL ER 180 MG PO TBCR
360.0000 mg | EXTENDED_RELEASE_TABLET | Freq: Every day | ORAL | Status: DC
  Administered 2014-01-29 – 2014-02-01 (×4): 360 mg via ORAL
  Filled 2014-01-29 (×5): qty 2

## 2014-01-29 MED ORDER — ONDANSETRON HCL 4 MG/2ML IJ SOLN: 4.0000 mg | Freq: Four times a day (QID) | INTRAMUSCULAR | Status: DC | PRN

## 2014-01-29 MED ORDER — ATORVASTATIN CALCIUM 20 MG PO TABS
20.0000 mg | ORAL_TABLET | Freq: Every morning | ORAL | Status: DC
  Administered 2014-01-29 – 2014-02-01 (×4): 20 mg via ORAL
  Filled 2014-01-29 (×5): qty 1

## 2014-01-29 MED ORDER — FERROUS GLUCONATE 324 (38 FE) MG PO TABS
324.0000 mg | ORAL_TABLET | Freq: Two times a day (BID) | ORAL | Status: DC
  Administered 2014-01-30 – 2014-02-01 (×5): 324 mg via ORAL
  Filled 2014-01-29 (×7): qty 1

## 2014-01-29 MED ORDER — ACETAMINOPHEN 650 MG RE SUPP: 650.0000 mg | Freq: Four times a day (QID) | RECTAL | Status: DC | PRN

## 2014-01-29 MED ORDER — INSULIN NPH (HUMAN) (ISOPHANE) 100 UNIT/ML ~~LOC~~ SUSP
10.0000 [IU] | Freq: Two times a day (BID) | SUBCUTANEOUS | Status: DC
  Administered 2014-01-29 – 2014-02-01 (×7): 10 [IU] via SUBCUTANEOUS
  Filled 2014-01-29: qty 10

## 2014-01-29 MED ORDER — COLCHICINE 0.6 MG PO TABS
0.6000 mg | ORAL_TABLET | Freq: Two times a day (BID) | ORAL | Status: DC
  Administered 2014-01-29 – 2014-02-01 (×8): 0.6 mg via ORAL
  Filled 2014-01-29 (×9): qty 1

## 2014-01-29 NOTE — Progress Notes (Signed)
[facility] TEAM 1 - Stepdown/ICU TEAM Progress Note  [name] VOH:[id number] DOB: 1946-06-20 DOA: 01/28/2014 PCP: [name], MD  Admit HPI / Brief Narrative: 68 y.o. male who was recently admitted for GI bleed requiring transfusion of PRBC.  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  He presented to the ER again because he had been feeling weak and short of breath on exertion over 3 days time. He also reported a bloody bowel movement 3 days prior to admit. Patient noted off and on feeling of dizziness particularly when he stood up.   In the ER patient's hemoglobin was found to be 7.6 - down from 10.5 2 weeks ago prior. Patient was also found to be in A. fib with RVR.   HPI/Subjective: Pt seen for f/u visit  Assessment/Plan:  GIB EGD 6/20 noted antral gastritis, ?GAVE, and an isoalted gastric polyp in the midbody  Acute blood loss anemia   Chronic Afib w/ acute RVR   Acute on Chronic kidney disease stage III baseline creatinine is around 1.5  NASH w/ Chronic thrombocytopenia  no esophageal varices on recent EGD   CHF  Acute on chronic renal failure  DM2  CAD s/p CABG 2004 Grafts patent via cardiac cath 2013  COPD  [place] of AoVR 2013 (bioprosthetic) due to AoS  Obesity - Body mass index is 40.07 kg/(m^2).  Code Status: FULL Family Communication: no family present at time of exam Disposition Plan: SDU  Consultants: GI  Procedures: none  Antibiotics: none  DVT prophylaxis: SCDs  Objective: Blood pressure 112/80, pulse 125, temperature 98.7 F (37.1 C), temperature source Oral, resp. rate 18, weight 119.5 kg (263 lb 7.2 oz), SpO2 97.00%.  Intake/Output Summary (Last 24 hours) at 01/29/14 1114 Last data filed at 01/29/14 1038  Gross per 24 hour  Intake 1688.75 ml  Output   2525 ml  Net -836.25 ml   Exam: F/U exam completed  Data Reviewed: Basic Metabolic Panel:  Recent Labs Lab 01/28/14 2022    NA 138  K 3.6*  CL 91*  CO2 28  GLUCOSE 200*  BUN 71*  CREATININE 2.25*  CALCIUM 9.3   Liver Function Tests:  Recent Labs Lab 01/28/14 2022  AST 32  ALT 42  ALKPHOS 77  BILITOT 0.6  PROT 7.3  ALBUMIN 3.7   CBC:  Recent Labs Lab 01/28/14 2022 01/29/14 1038  WBC 4.8 5.4  NEUTROABS  --  4.1  HGB 7.6* 9.0*  HCT 24.9* 29.1*  MCV 90.5 88.7  PLT 143* 130*   CBG:  Recent Labs Lab 01/29/14 0352 01/29/14 0508 01/29/14 0851  GLUCAP 162* 155* 193*    Recent Results (from the past 240 hour(s))  MRSA PCR SCREENING     Status: None   Collection Time    01/29/14  5:08 AM      Result Value Ref Range Status   MRSA by PCR NEGATIVE  NEGATIVE Final   Comment:            The GeneXpert MRSA Assay (FDA     approved for NASAL specimens     only), is one component of a     comprehensive MRSA colonization     surveillance program. It is not     intended to diagnose MRSA     infection nor to guide or     monitor treatment for     MRSA infections.     Studies:  Recent x-ray studies  have been reviewed in detail by the Attending Physician  Scheduled Meds:  Scheduled Meds: . atorvastatin  20 mg Oral q morning - 10a  . cefTRIAXone (ROCEPHIN)  IV  1 g Intravenous Q24H  . colchicine  0.6 mg Oral BID  . ferrous gluconate  325 mg Oral BID WC  . insulin aspart  0-9 Units Subcutaneous TID WC  . insulin NPH Human  10 Units Subcutaneous BID AC & HS  . metoprolol succinate  100 mg Oral BID  . [START ON 02/01/2014] pantoprazole (PROTONIX) IV  40 mg Intravenous Q12H  . potassium chloride  40 mEq Oral Once  . sodium chloride  3 mL Intravenous Q12H  . torsemide  60 mg Oral BID  . verapamil  360 mg Oral Daily   Time spent on care of this patient: 25+ mins  Kierrah Kilbride T , MD   [facility] Office  [phone] Pager - Text Page per Shea Evans as per below:  On-Call/Text Page:      [email or web address]      password [email or web address]  If 7PM-7AM, please contact  night-coverage [email or web address] Password [email or web address] 01/29/2014, 11:14 AM   LOS: 1 day

## 2014-01-29 NOTE — ED Notes (Signed)
2 UNITS OF BLOOD READY

## 2014-01-29 NOTE — ED Notes (Signed)
Mitzie Na [phone]. Please call wife with room assignment

## 2014-01-29 NOTE — Progress Notes (Addendum)
[facility] Progress Note Patient Name: [name] DOB: 02-Mar-1946 MRN: [id number]  Date of Service  01/29/2014   HPI/Events of Note   GI bleed, AF -RVR  eICU Interventions  Lopressor IV prn  Resume PO lopressor/verapamil with parameters    This patient was evaluated by the [facility] team. I have reviewed relevant documentation including care plan & orders.  Intervention Category Evaluation Type: New Patient Evaluation  ALVA,RAKESH V. 01/29/2014, 5:19 AM

## 2014-01-29 NOTE — H&P (Addendum)
[facility] History and Physical  CIRO TASHIRO PJA:[id number] DOB: Jan 21, 1946 DOA: 01/28/2014  Referring physician: ER physician. PCP: [name], MD   Chief Complaint: Weakness.  HPI: [name] is a 68 y.o. 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 3 days ago patient also had a bloody bowel movement. Patient denies any abdominal pain nausea vomiting chest pain. Has been having off and on feeling of dizziness particularly when he stands up from a sitting position. In the ER patient's hemoglobin was found to be around 7.6 it dropped from 10.5 2 weeks ago. Patient is also found to be in A. fib with RVR. Initial blood pressure was mildly in the lower side. Patient will be admitted for further management. Patient has history of AVMs and NASH with thrombocytopenia. Denies taking any NSAIDs or is not on any blood thinners.   Review of Systems: As presented in the history of presenting illness, rest negative.  Past Medical History  Diagnosis Date  . Overweight(278.02)   . CAD (coronary artery disease)     a. s/p CABG 2004;  b. [place] 10/13:  LHC 8/13: Free radial to obtuse marginal patent, SVG-diagonal patent, LIMA-LAD patent, EF 65-70%, mean aortic valve gradient 42  . Atrial fibrillation     Permanent; off of Coumadin for now due to GI bleed  . DM2 (diabetes mellitus, type 2)   . Insomnia   . Carotid bruit 06/14/2011    a. pre-AVR dopplers 10/13: no sig ICA stenosis  . Hypertension   . Chronic diastolic heart failure   . GERD (gastroesophageal reflux disease)   . COPD (chronic obstructive pulmonary disease)   . Iron deficiency anemia     Requiring intravenous iron  . H/O hiatal hernia   . AVM (arteriovenous malformation)     Recurrent  GI bleeding requiring multiple transfusions  . Hyperlipidemia   . Thrombocytopenia   . Ascites     status post paracentesis with removal of 3.4 L of ascitic fluid  . Osteoarthritis   . Mediastinal adenopathy 09/22/2011  . Cirrhosis   . Iron deficiency anemia   . Mediastinal adenopathy 09/22/2011  . Aortic stenosis 03/08/2012    a.  s/p tissue AVR 10/13 with Dr. [name];   b. Echo 10/13: mod LVH, EF 55-60%, tissue AVR not well seen, no leak, gradient not too high (mean 60mmHg), MAC, mild MR, mild LAE, PASP 38  . OSA (obstructive sleep apnea) 1999    DOES NOT USE CPAP   Past Surgical History  Procedure Laterality Date  . Coronary artery bypass graft   10/15/2002     [name]. [name], M.D.     . Carpal tunnel release    10/08/2003  . Lipoma surgery    . Hernia repair    . Other surgical history  08/26/2011    [facility],  enteroscopy , revealing "three-four AVMs."   . Tee without cardioversion  03/07/2012    Procedure: TRANSESOPHAGEAL ECHOCARDIOGRAM (TEE);  Surgeon: Thayer Headings, MD;  Location: [facility] ENDOSCOPY;  Service: Cardiovascular;  Laterality: N/A;  . Coronary angioplasty with stent placement  01/19/2005    drug eluting stent to high grade ostial stenosis of radial artery graft to OM  . Aortic valve replacement  04/25/2012  Procedure: AORTIC VALVE REPLACEMENT (AVR);  Surgeon: Melrose Nakayama, MD;  Location: [place];  Service: Open Heart Surgery;  Laterality: N/A;  . Esophagogastroduodenoscopy N/A 07/31/2013    Procedure: ESOPHAGOGASTRODUODENOSCOPY (EGD);  Surgeon: Milus Banister, MD;  Location: [place];  Service: Endoscopy;  Laterality: N/A;  . Esophagogastroduodenoscopy Left 01/11/2014    Procedure: ESOPHAGOGASTRODUODENOSCOPY (EGD);  Surgeon: Juanita Craver, MD;  Location: [facility] ENDOSCOPY;  Service: Endoscopy;  Laterality: Left;   Social History:  reports that he quit smoking about 13 years ago. His smoking use included Cigarettes. He has a 30 pack-year smoking history. He has  never used smokeless tobacco. He reports that he drinks about .6 ounces of alcohol per week. He reports that he does not use illicit drugs. Where does patient live  Home. Can patient participate in ADLs? Yes.  Allergies  Allergen Reactions  . Codeine Other (See Comments)    Hurting in chest  . Diltiazem Hcl Itching  . Niacin Other (See Comments)    Hot flashes    Family History:  Family History  Problem Relation Age of Onset  . Coronary artery disease      FAMILY HISTORY  . Hypertension Father   . Diabetes Father   . Heart disease Father   . COPD Sister   . Cancer Maternal Aunt     Breast cancer   . Cancer Maternal Aunt     Breast cancer   . Diabetes Son   . Cancer Daughter     Cervical cancer      Prior to Admission medications   Medication Sig Start Date End Date Taking? Authorizing Provider  atorvastatin (LIPITOR) 20 MG tablet Take 20 mg by mouth every morning.    Yes Historical Provider, MD  colchicine 0.6 MG tablet Take 1 tablet (0.6 mg total) by mouth 2 (two) times daily. 01/14/14  Yes Thurnell Lose, MD  ferrous gluconate (FERGON) 325 MG tablet Take 325 mg by mouth 2 (two) times daily.    Yes Historical Provider, MD  insulin NPH Human (HUMULIN N,NOVOLIN N) 100 UNIT/ML injection Inject 20-25 Units into the skin 2 (two) times daily before a meal.   Yes Historical Provider, MD  insulin regular (NOVOLIN R,HUMULIN R) 100 units/mL injection Inject 20-25 Units into the skin 2 (two) times daily.   Yes Historical Provider, MD  losartan (COZAAR) 50 MG tablet Take 50 mg by mouth daily.   Yes Historical Provider, MD  metFORMIN (GLUCOPHAGE) 1000 MG tablet Take 1,000 mg by mouth 2 (two) times daily with a meal.    Yes Historical Provider, MD  metolazone (ZAROXOLYN) 2.5 MG tablet Take 2.5 mg by mouth every Monday, Wednesday, and Friday.   Yes Historical Provider, MD  metoprolol succinate (TOPROL-XL) 100 MG 24 hr tablet Take 1 tablet (100 mg total) by mouth 2 (two) times daily. Take  with or immediately following a meal. 11/05/13  Yes Amy D Clegg, NP  Multiple Vitamins-Minerals ([employer] SPECTRAVITE ADULT 50+ PO) Take 1 tablet by mouth daily.   Yes Historical Provider, MD  pantoprazole (PROTONIX) 40 MG tablet Take 1 tablet (40 mg total) by mouth 2 (two) times daily. 01/14/14  Yes Thurnell Lose, MD  potassium chloride (K-DUR,KLOR-CON) 10 MEQ tablet Take 60 mEq by mouth 2 (two) times daily.   Yes Historical Provider, MD  torsemide (DEMADEX) 20 MG tablet Take 120 mg by mouth 2 (two) times daily.   Yes Historical Provider, MD  verapamil (COVERA HS) 180 MG (CO) 24 hr  tablet Take 360 mg by mouth every morning.   Yes Historical Provider, MD  vitamin C (ASCORBIC ACID) 500 MG tablet Take 500 mg by mouth 2 (two) times daily.   Yes Historical Provider, MD    Physical Exam: Filed Vitals:   01/29/14 0100 01/29/14 0103 01/29/14 0104 01/29/14 0112  BP: 102/60 102/60    Pulse: 126 124 123 103  Temp:      TempSrc:      Resp: 22 13 10 13   SpO2: 100% 100% 100% 100%     General:  Well-developed and nourished.  Eyes: Anicteric mild pallor.  ENT: No discharge from ears eyes nose mouth.  Neck: No mass felt.  Cardiovascular: S1-S2 heard.  Respiratory: No rhonchi or crepitations.  Abdomen: Soft nontender bowel sounds present. No guarding or rigidity.  Skin: No rash.  Musculoskeletal: Mild edema.  Psychiatric: Appears normal.  Neurologic: Alert awake oriented to time place and person. Moves all extremities.  Labs on Admission:  Basic Metabolic Panel:  Recent Labs Lab 01/28/14 2022  NA 138  K 3.6*  CL 91*  CO2 28  GLUCOSE 200*  BUN 71*  CREATININE 2.25*  CALCIUM 9.3   Liver Function Tests:  Recent Labs Lab 01/28/14 2022  AST 32  ALT 42  ALKPHOS 77  BILITOT 0.6  PROT 7.3  ALBUMIN 3.7   No results found for this basename: LIPASE, AMYLASE,  in the last 168 hours No results found for this basename: AMMONIA,  in the last 168 hours CBC:  Recent Labs Lab  01/28/14 2022  WBC 4.8  HGB 7.6*  HCT 24.9*  MCV 90.5  PLT 143*   Cardiac Enzymes: No results found for this basename: CKTOTAL, CKMB, CKMBINDEX, TROPONINI,  in the last 168 hours  BNP (last 3 results)  Recent Labs  07/30/13 1309 10/15/13 1148 12/11/13 0921  PROBNP 1166.0* 191.0* 1152.0*   CBG: No results found for this basename: GLUCAP,  in the last 168 hours  Radiological Exams on Admission: No results found.  EKG: Independently reviewed. Atrial fibrillation with RBBB.  Assessment/Plan Active Problems:   Chronic diastolic CHF (congestive heart failure)   Atrial fibrillation with RVR   GI bleeding   Symptomatic anemia   Renal failure (ARF), acute on chronic   GI bleed   1. 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 Patient will be placed on Protonix infusion. Empiric antibiotics for possible cirrhosis. Transfuse 2 units of PRBC. Recheck hemoglobin after transfusion. Consult GI in a.m. and sooner if patient has active bleeding. 2. Atrial fibrillation with RVR - patient's heart rate has increased initially and at this time is improved with fluid. Closely monitor heart rate. 3. CHF - since patient's blood pressure was initially mildly low patient's torsemide dose has been decreased to half. I am also holding off patient's Cozaar. Closely follow intake output and metabolic panel and fluid balance. 4. Anemia - secondary to #1. 5. Acute on chronic renal failure - probably secondary to #1 and mild hypotension. 6. Diabetes mellitus - since patient is n.p.o. patient will be placed on half the dose of patient's insulin. 7. CAD - denies any chest pain. 8. History of aortic valve replacement bioprosthetic - closely observe.  Addendum - I have consulted gastroenterologist Dr. Collene Mares who will be seeing patient in consult.  Code Status: Full code.  Family Communication: None.  Disposition Plan: Admit to inpatient.    KAKRAKANDY,ARSHAD  N. [facility] Pager  [phone].  If 7PM-7AM, please contact night-coverage [email or web address] Password [email or web address] 01/29/2014, 1:20 AM

## 2014-01-29 NOTE — Progress Notes (Signed)
UR Completed.  [name] [phone] 01/29/2014

## 2014-01-29 NOTE — ED Provider Notes (Addendum)
CSN: [id number]     Arrival date & time 01/28/14  2002 History   First MD Initiated Contact with Patient 01/28/14 2301     Chief Complaint  Patient presents with  . Abnormal Lab     (Consider location/radiation/quality/duration/timing/severity/associated sxs/prior Treatment) HPI 68 year old male presents to emergency department from home with reported abnormal lab.  Patient reports that he has been feeling run down, fatigued and having worsening shortness of breath over the last 3-4 days.  Patient reports he frequently has low blood counts requiring blood transfusions.  He has history of AV malformations.  Patient with similar presentation about 2 weeks ago.  He denies any abdominal pain.  He reports seeing bright red blood a few days ago, since then he has had dark black stools. Past Medical History  Diagnosis Date  . Overweight(278.02)   . CAD (coronary artery disease)     a. s/p CABG 2004;  b. [place] 10/13:  LHC 8/13: Free radial to obtuse marginal patent, SVG-diagonal patent, LIMA-LAD patent, EF 65-70%, mean aortic valve gradient 42  . Atrial fibrillation     Permanent; off of Coumadin for now due to GI bleed  . DM2 (diabetes mellitus, type 2)   . Insomnia   . Carotid bruit 06/14/2011    a. pre-AVR dopplers 10/13: no sig ICA stenosis  . Hypertension   . Chronic diastolic heart failure   . GERD (gastroesophageal reflux disease)   . COPD (chronic obstructive pulmonary disease)   . Iron deficiency anemia     Requiring intravenous iron  . H/O hiatal hernia   . AVM (arteriovenous malformation)     Recurrent GI bleeding requiring multiple transfusions  . Hyperlipidemia   . Thrombocytopenia   . Ascites     status post paracentesis with removal of 3.4 L of ascitic fluid  . Osteoarthritis   . Mediastinal adenopathy 09/22/2011  . Cirrhosis   . Iron deficiency anemia   . Mediastinal adenopathy 09/22/2011  . Aortic stenosis 03/08/2012    a.  s/p tissue AVR 10/13 with Dr. [name];   b.  Echo 10/13: mod LVH, EF 55-60%, tissue AVR not well seen, no leak, gradient not too high (mean 42mmHg), MAC, mild MR, mild LAE, PASP 38  . OSA (obstructive sleep apnea) 1999    DOES NOT USE CPAP   Past Surgical History  Procedure Laterality Date  . Coronary artery bypass graft   10/15/2002     [name]. [name], M.D.     . Carpal tunnel release    10/08/2003  . Lipoma surgery    . Hernia repair    . Other surgical history  08/26/2011    [facility],  enteroscopy , revealing "three-four AVMs."   . Tee without cardioversion  03/07/2012    Procedure: TRANSESOPHAGEAL ECHOCARDIOGRAM (TEE);  Surgeon: Thayer Headings, MD;  Location: [facility] ENDOSCOPY;  Service: Cardiovascular;  Laterality: N/A;  . Coronary angioplasty with stent placement  01/19/2005    drug eluting stent to high grade ostial stenosis of radial artery graft to OM  . Aortic valve replacement  04/25/2012    Procedure: AORTIC VALVE REPLACEMENT (AVR);  Surgeon: Melrose Nakayama, MD;  Location: [place];  Service: Open Heart Surgery;  Laterality: N/A;  . Esophagogastroduodenoscopy N/A 07/31/2013    Procedure: ESOPHAGOGASTRODUODENOSCOPY (EGD);  Surgeon: Milus Banister, MD;  Location: [place];  Service: Endoscopy;  Laterality: N/A;  . Esophagogastroduodenoscopy Left 01/11/2014    Procedure: ESOPHAGOGASTRODUODENOSCOPY (EGD);  Surgeon: Juanita Craver, MD;  Location:  [place] ENDOSCOPY;  Service: Endoscopy;  Laterality: Left;   Family History  Problem Relation Age of Onset  . Coronary artery disease      FAMILY HISTORY  . Hypertension Father   . Diabetes Father   . Heart disease Father   . COPD Sister   . Cancer Maternal Aunt     Breast cancer   . Cancer Maternal Aunt     Breast cancer   . Diabetes Son   . Cancer Daughter     Cervical cancer   History  Substance Use Topics  . Smoking status: Former Smoker -- 1.00 packs/day for 30 years    Types: Cigarettes    Quit date: 07/25/2000  . Smokeless tobacco: Never Used     Comment: Quit  smoking in 2002  . Alcohol Use: 0.6 oz/week    1 Shots of liquor per week     Comment: beer 1 week ago    Review of Systems   See History of Present Illness; otherwise all other systems are reviewed and negative  Allergies  Codeine; Diltiazem hcl; and Niacin  Home Medications   Prior to Admission medications   Medication Sig Start Date End Date Taking? Authorizing Provider  atorvastatin (LIPITOR) 20 MG tablet Take 20 mg by mouth every morning.    Yes Historical Provider, MD  colchicine 0.6 MG tablet Take 1 tablet (0.6 mg total) by mouth 2 (two) times daily. 01/14/14  Yes Thurnell Lose, MD  ferrous gluconate (FERGON) 325 MG tablet Take 325 mg by mouth 2 (two) times daily.    Yes Historical Provider, MD  insulin NPH Human (HUMULIN N,NOVOLIN N) 100 UNIT/ML injection Inject 20-25 Units into the skin 2 (two) times daily before a meal.   Yes Historical Provider, MD  insulin regular (NOVOLIN R,HUMULIN R) 100 units/mL injection Inject 20-25 Units into the skin 2 (two) times daily.   Yes Historical Provider, MD  losartan (COZAAR) 50 MG tablet Take 50 mg by mouth daily.   Yes Historical Provider, MD  metFORMIN (GLUCOPHAGE) 1000 MG tablet Take 1,000 mg by mouth 2 (two) times daily with a meal.    Yes Historical Provider, MD  metolazone (ZAROXOLYN) 2.5 MG tablet Take 2.5 mg by mouth every Monday, Wednesday, and Friday.   Yes Historical Provider, MD  metoprolol succinate (TOPROL-XL) 100 MG 24 hr tablet Take 1 tablet (100 mg total) by mouth 2 (two) times daily. Take with or immediately following a meal. 11/05/13  Yes Amy D Clegg, NP  Multiple Vitamins-Minerals ([employer]S SPECTRAVITE ADULT 50+ PO) Take 1 tablet by mouth daily.   Yes Historical Provider, MD  pantoprazole (PROTONIX) 40 MG tablet Take 1 tablet (40 mg total) by mouth 2 (two) times daily. 01/14/14  Yes Thurnell Lose, MD  potassium chloride (K-DUR,KLOR-CON) 10 MEQ tablet Take 60 mEq by mouth 2 (two) times daily.   Yes Historical Provider, MD   torsemide (DEMADEX) 20 MG tablet Take 120 mg by mouth 2 (two) times daily.   Yes Historical Provider, MD  verapamil (COVERA HS) 180 MG (CO) 24 hr tablet Take 360 mg by mouth every morning.   Yes Historical Provider, MD  vitamin C (ASCORBIC ACID) 500 MG tablet Take 500 mg by mouth 2 (two) times daily.   Yes Historical Provider, MD   BP 102/44  Pulse 112  Temp(Src) 98.2 F (36.8 C) (Oral)  Resp 10  SpO2 100% Physical Exam  Nursing note and vitals reviewed. Constitutional: He is oriented to person, place,  and time. He appears well-developed and well-nourished. No distress.  HENT:  Head: Normocephalic and atraumatic.  Nose: Nose normal.  Mouth/Throat: Oropharynx is clear and moist.  Eyes: Conjunctivae and EOM are normal. Pupils are equal, round, and reactive to light.  Neck: Normal range of motion. Neck supple. No JVD present. No tracheal deviation present. No thyromegaly present.  Cardiovascular: Intact distal pulses.  Exam reveals no gallop and no friction rub.   Murmur heard. Tachycardia noted, irregular rhythm  Pulmonary/Chest: Effort normal and breath sounds normal. No stridor. No respiratory distress. He has no wheezes. He has no rales. He exhibits no tenderness.  Abdominal: Soft. Bowel sounds are normal. He exhibits no distension and no mass. There is no tenderness. There is no rebound and no guarding.  Musculoskeletal: Normal range of motion. He exhibits no edema and no tenderness.  Lymphadenopathy:    He has no cervical adenopathy.  Neurological: He is alert and oriented to person, place, and time. He has normal reflexes. He exhibits normal muscle tone. Coordination normal.  Skin: Skin is warm and dry. No rash noted. No erythema. No pallor.  Psychiatric: He has a normal mood and affect. His behavior is normal. Judgment and thought content normal.    ED Course  Procedures (including critical care time)  CRITICAL CARE Performed by: Kalman Drape Total critical care time: 60  min Critical care time was exclusive of separately billable procedures and treating other patients. Critical care was necessary to treat or prevent imminent or life-threatening deterioration. Critical care was time spent personally by me on the following activities: development of treatment plan with patient and/or surrogate as well as nursing, discussions with consultants, evaluation of patient's response to treatment, examination of patient, obtaining history from patient or surrogate, ordering and performing treatments and interventions, ordering and review of laboratory studies, ordering and review of radiographic studies, pulse oximetry and re-evaluation of patient's condition.  Labs Review Labs Reviewed  CBC - Abnormal; Notable for the following:    RBC 2.75 (*)    Hemoglobin 7.6 (*)    HCT 24.9 (*)    RDW 18.2 (*)    Platelets 143 (*)    All other components within normal limits  COMPREHENSIVE METABOLIC PANEL - Abnormal; Notable for the following:    Potassium 3.6 (*)    Chloride 91 (*)    Glucose, Bld 200 (*)    BUN 71 (*)    Creatinine, Ser 2.25 (*)    GFR calc non Af Amer 29 (*)    GFR calc Af Amer 33 (*)    Anion gap 19 (*)    All other components within normal limits  POC OCCULT BLOOD, ED  TYPE AND SCREEN  PREPARE RBC (CROSSMATCH)    Imaging Review No results found.   EKG Interpretation   Date/Time:  Wednesday January 29 2014 00:02:49 EDT Ventricular Rate:  105 PR Interval:    QRS Duration: 144 QT Interval:  391 QTC Calculation: 517 R Axis:   -89 Text Interpretation:  Atrial fibrillation Paired ventricular premature  complexes Right bundle branch block Inferior infarct, old Baseline wander  in lead(s) II aVF No significant change since last tracing Confirmed by  Sandrine Bloodsworth  MD, Nigel Ericsson ([id number]) on 01/29/2014 12:15:22 AM      MDM   Final diagnoses:  Symptomatic anemia  Gastrointestinal hemorrhage with melena  Atrial fibrillation with RVR   68 year old male with  symptomatic anemia most likely from GI bleeding, history of AV malformation.  Patient will  need blood transfusion.  Blood pressure and heart rate are noted.  Patient has history of atrial fibrillation, currently tachycardic with low blood pressures.  We'll give fluid bolus, and plan for blood transfusion.  Will discuss with hospitalist for admission.  Kalman Drape, MD 01/29/14 0020  Kalman Drape, MD 01/29/14 [phone]

## 2014-01-30 ENCOUNTER — Encounter (HOSPITAL_COMMUNITY)
Admission: EM | Disposition: A | Discharge status: Home / Self Care | Payer: Self-pay | Source: Home / Self Care | Attending: Internal Medicine

## 2014-01-30 DIAGNOSIS — I359 Nonrheumatic aortic valve disorder, unspecified: Secondary | ICD-10-CM | POA: Diagnosis not present

## 2014-01-30 DIAGNOSIS — N183 Chronic kidney disease, stage 3 unspecified: Secondary | ICD-10-CM

## 2014-01-30 DIAGNOSIS — I5032 Chronic diastolic (congestive) heart failure: Secondary | ICD-10-CM

## 2014-01-30 DIAGNOSIS — I5033 Acute on chronic diastolic (congestive) heart failure: Secondary | ICD-10-CM

## 2014-01-30 DIAGNOSIS — I4891 Unspecified atrial fibrillation: Secondary | ICD-10-CM | POA: Diagnosis not present

## 2014-01-30 DIAGNOSIS — I509 Heart failure, unspecified: Secondary | ICD-10-CM

## 2014-01-30 DIAGNOSIS — D649 Anemia, unspecified: Secondary | ICD-10-CM | POA: Diagnosis not present

## 2014-01-30 LAB — COMPREHENSIVE METABOLIC PANEL
ALBUMIN: 3.7 g/dL (ref 3.5–5.2)
ALK PHOS: 74 U/L (ref 39–117)
ALT: 53 U/L (ref 0–53)
ANION GAP: 17 — AB (ref 5–15)
AST: 51 U/L — ABNORMAL HIGH (ref 0–37)
BILIRUBIN TOTAL: 1.7 mg/dL — AB (ref 0.3–1.2)
BUN: 58 mg/dL — AB (ref 6–23)
CHLORIDE: 91 meq/L — AB (ref 96–112)
CO2: 28 mEq/L (ref 19–32)
CREATININE: 1.84 mg/dL — AB (ref 0.50–1.35)
Calcium: 9 mg/dL (ref 8.4–10.5)
GFR, EST AFRICAN AMERICAN: 43 mL/min — AB (ref >90)
GFR, EST NON AFRICAN AMERICAN: 37 mL/min — AB (ref >90)
GLUCOSE: 152 mg/dL — AB (ref 70–99)
Potassium: 3.5 mEq/L — ABNORMAL LOW (ref 3.7–5.3)
Sodium: 136 mEq/L — ABNORMAL LOW (ref 137–147)
Total Protein: 7 g/dL (ref 6.0–8.3)

## 2014-01-30 LAB — CBC
HCT: 28.9 % — ABNORMAL LOW (ref 39.0–52.0)
Hemoglobin: 9.1 g/dL — ABNORMAL LOW (ref 13.0–17.0)
MCH: 28.3 pg (ref 26.0–34.0)
MCHC: 31.5 g/dL (ref 30.0–36.0)
MCV: 89.8 fL (ref 78.0–100.0)
PLATELETS: 137 10*3/uL — AB (ref 150–400)
RBC: 3.22 MIL/uL — ABNORMAL LOW (ref 4.22–5.81)
RDW: 17.7 % — AB (ref 11.5–15.5)
WBC: 4.5 10*3/uL (ref 4.0–10.5)

## 2014-01-30 LAB — GLUCOSE, CAPILLARY
GLUCOSE-CAPILLARY: 182 mg/dL — AB (ref 70–99)
Glucose-Capillary: 156 mg/dL — ABNORMAL HIGH (ref 70–99)
Glucose-Capillary: 239 mg/dL — ABNORMAL HIGH (ref 70–99)
Glucose-Capillary: 177 mg/dL — ABNORMAL HIGH (ref 70–99)

## 2014-01-30 LAB — MAGNESIUM: Magnesium: 1.7 mg/dL (ref 1.5–2.5)

## 2014-01-30 SURGERY — EGD (ESOPHAGOGASTRODUODENOSCOPY)
Anesthesia: Moderate Sedation

## 2014-01-30 NOTE — Progress Notes (Signed)
[place] TEAM 1 - Stepdown/ICU TEAM Progress Note  [name] URK:[id number] DOB: 1946-05-19 DOA: 01/28/2014 PCP: [name], MD  Admit HPI / Brief Narrative: 68 y.o. male who was recently admitted for GI bleed requiring transfusion of PRBC.  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  He presented to the ER again because he had been feeling weak and short of breath on exertion over 3 days time. He also reported a bloody bowel movement 3 days prior to admit. Patient noted off and on feeling of dizziness particularly when he stood up.   In the ER patient's hemoglobin was found to be 7.6 - down from 10.5 2 weeks ago prior. Patient was also found to be in A. fib with RVR.   HPI/Subjective: Alert and says "feels fine" No signs of bleeding. Says stools always dark from iron he takes  Assessment/Plan:  GIB -EGD 6/20 noted antral gastritis, ?GAVE, and an isolated gastric polyp in the midbody -Dr. Sloan Leiter spoke with Dr. Collene Mares who said no add'l GI work up indicated at this time unless shows signs of active bleeding such as bright red blood or sudden decrease in Hgb -advancing diet,c/w PPI -BUN still up so suspect is slowly oozing due to ?? GAVE  Acute blood loss anemia  -Hgb stable at 9.1  Acute on Chronic Afib w/ acute RVR  -now with resting HRs 120-140s despite high dose BB and CCB -Cardiology consulted-per cards-Heart rate chronically elevated. Not a candidate for anticoagulation given GI bleed -pt says rate control and associated CHF have been chronic issues  Acute on Chronic kidney disease stage III -baseline creatinine is around 1.5  NASH w/ Chronic thrombocytopenia  -no esophageal varices on recent EGD   CHF -ECHO 2014 with preserved LV fnx -currently compensated and suspect exacerbations are from RVR/tachycardia  DM2 -CBGs controlled -HgbA1c 5.8 on 6/19  CAD s/p CABG 2004 Grafts patent via cardiac cath  2013  COPD -compensated  [place] of AoVR 2013 (bioprosthetic) due to AoS  Obesity - Body mass index is 39.69 kg/(m^2).  Code Status: FULL Family Communication: Wife at bedside Disposition Plan: SDU  Consultants: GI (Telephone) CARDIOLOGY  Procedures: none  Antibiotics: none  DVT prophylaxis: SCDs  Objective: Blood pressure 113/68, pulse 125, temperature 98.1 F (36.7 C), temperature source Oral, resp. rate 13, height 5' 8.11" (1.73 m), weight 261 lb 14.5 oz (118.8 kg), SpO2 98.00%.  Intake/Output Summary (Last 24 hours) at 01/30/14 1055 Last data filed at 01/30/14 0911  Gross per 24 hour  Intake   1463 ml  Output    500 ml  Net    963 ml   Exam: General: No acute respiratory distress Lungs: Clear to auscultation bilaterally without wheezes or crackles, RA Cardiovascular: Irregular rate (rates 120-140s) and rhythm without murmur gallop or rub normal S1 and S2, no peripheral edema or JVD Abdomen: Nontender, nondistended, soft, bowel sounds positive, no rebound, no ascites, no appreciable mass Musculoskeletal: No significant cyanosis, clubbing of bilateral lower extremities  Data Reviewed: Basic Metabolic Panel:  Recent Labs Lab 01/28/14 2022 01/29/14 1038 01/30/14 0009  NA 138 140 136*  K 3.6* 3.4* 3.5*  CL 91* 91* 91*  CO2 28 30 28   GLUCOSE 200* 209* 152*  BUN 71* 65* 58*  CREATININE 2.25* 1.91* 1.84*  CALCIUM 9.3 9.5 9.0  MG  --   --  1.7   Liver Function Tests:  Recent Labs Lab 01/28/14 2022 01/29/14 1038 01/30/14 0009  AST 32 44* 51*  ALT 42 47 53  ALKPHOS 77 78 74  BILITOT 0.6 3.1* 1.7*  PROT 7.3 7.3 7.0  ALBUMIN 3.7 3.8 3.7   CBC:  Recent Labs Lab 01/28/14 2022 01/29/14 1038 01/30/14  WBC 4.8 5.4 4.5  NEUTROABS  --  4.1  --   HGB 7.6* 9.0* 9.1*  HCT 24.9* 29.1* 28.9*  MCV 90.5 88.7 89.8  PLT 143* 130* 137*   CBG:  Recent Labs Lab 01/29/14 0851 01/29/14 1223 01/29/14 1649 01/29/14 2235 01/30/14 0810  GLUCAP 193* 190*  198* 157* 182*    Recent Results (from the past 240 hour(s))  MRSA PCR SCREENING     Status: None   Collection Time    01/29/14  5:08 AM      Result Value Ref Range Status   MRSA by PCR NEGATIVE  NEGATIVE Final   Comment:            The GeneXpert MRSA Assay (FDA     approved for NASAL specimens     only), is one component of a     comprehensive MRSA colonization     surveillance program. It is not     intended to diagnose MRSA     infection nor to guide or     monitor treatment for     MRSA infections.     Studies:  Recent x-ray studies have been reviewed in detail by the Attending Physician  Scheduled Meds:  Scheduled Meds: . atorvastatin  20 mg Oral q morning - 10a  . colchicine  0.6 mg Oral BID  . ferrous gluconate  324 mg Oral BID WC  . insulin aspart  0-9 Units Subcutaneous TID WC  . insulin NPH Human  10 Units Subcutaneous BID AC & HS  . metoprolol succinate  100 mg Oral BID  . [START ON 02/01/2014] pantoprazole (PROTONIX) IV  40 mg Intravenous Q12H  . sodium chloride  3 mL Intravenous Q12H  . verapamil  360 mg Oral Daily   Time spent on care of this patient: 25+ mins  ELLIS,ALLISON L. , ANP  [facility] Office  [phone] Pager - Text Page per Shea Evans as per below:  On-Call/Text Page:      [email or web address]      password [email or web address]  If 7PM-7AM, please contact night-coverage [email or web address] Password [email or web address] 01/30/2014, 10:55 AM   LOS: 2 days   Attending Patient was seen, examined,treatment plan was discussed with the Physician extender. I have directly reviewed the clinical findings, lab, imaging studies and management of this patient in detail. I have made the necessary changes to the above noted documentation, and agree with the documentation, as recorded by the Physician extender.  Nena Alexander MD [facility].

## 2014-01-30 NOTE — Consult Note (Signed)
CARDIOLOGY CONSULT NOTE   Patient ID: [name] MRN: [id number] DOB/AGE: [birth year] 68 y.o.  Admit date: 01/28/2014  Primary Physician   [name], MD Primary Cardiologist   DB, Adv CHF  Reason for Consultation   Atrial fibrillation, rapid ventricular response  [id number] [name] is a 68 y.o. caucasian male with past medical history significant for chronic a-fib with RVR, recurrent GI bleeds on anticoagulation, D-CHF, HLD, DM2, CAD (stent '06 and CABG '04), AVR 04/2012, who presented to the ER 07/08 with weakness, dizziness, SOB, and 1 episode of BRBPR in the last 3 days. Hgb was 7.6 - down from 10.5 two weeks ago. He was also hypotensive and in A. fib with RVR.   Today, pt states that he feels better.  The a-fib is a chronic issue that he has had since 2003.  He gets intermittent palpitations with associated SOB.  He has been followed by Dr. Seward Grater in the advanced heart failure clinic.  He has not tolerated anticoagulation including coumadin, and aspirin 81 mg due to recurrent GI bleeds. Pt tells [place] that he has received 18 units of blood products in the last 3-4  Years.  Denies current palpitations, SOB, chest pain, diaphoresis, dizziness, lightheadedness, swelling of extremities.  Past Medical History  Diagnosis Date  . Overweight(278.02)   . CAD (coronary artery disease)     a. s/p CABG 2004;  b. [place] 10/13:  LHC 8/13: Free radial to obtuse marginal patent, SVG-diagonal patent, LIMA-LAD patent, EF 65-70%, mean aortic valve gradient 42  . Atrial fibrillation     Permanent; off of Coumadin for now due to GI bleed  . DM2 (diabetes mellitus, type 2)   . Insomnia   . Carotid bruit 06/14/2011    a. pre-AVR dopplers 10/13: no sig ICA stenosis  . Hypertension   . Chronic diastolic heart failure   . GERD (gastroesophageal reflux disease)   . COPD (chronic obstructive pulmonary disease)   . Iron deficiency anemia     Requiring intravenous iron  . H/O hiatal hernia   .  AVM (arteriovenous malformation)     Recurrent GI bleeding requiring multiple transfusions  . Hyperlipidemia   . Thrombocytopenia   . Ascites     status post paracentesis with removal of 3.4 L of ascitic fluid  . Osteoarthritis   . Mediastinal adenopathy 09/22/2011  . Cirrhosis   . Iron deficiency anemia   . Mediastinal adenopathy 09/22/2011  . Aortic stenosis 03/08/2012    a.  s/p tissue AVR 10/13 with Dr. [name];   b. Echo 10/13: mod LVH, EF 55-60%, tissue AVR not well seen, no leak, gradient not too high (mean 54mmHg), MAC, mild MR, mild LAE, PASP 38  . OSA (obstructive sleep apnea) 1999    DOES NOT USE CPAP     Past Surgical History  Procedure Laterality Date  . Coronary artery bypass graft   10/15/2002     [name]. [name], M.D.     . Carpal tunnel release    10/08/2003  . Lipoma surgery    . Hernia repair    . Other surgical history  08/26/2011    [facility],  enteroscopy , revealing "three-four AVMs."   . Tee without cardioversion  03/07/2012    Procedure: TRANSESOPHAGEAL ECHOCARDIOGRAM (TEE);  Surgeon: Thayer Headings, MD;  Location: [facility] ENDOSCOPY;  Service: Cardiovascular;  Laterality: N/A;  . Coronary angioplasty with stent placement  01/19/2005    drug eluting stent to high  grade ostial stenosis of radial artery graft to OM  . Aortic valve replacement  04/25/2012    Procedure: AORTIC VALVE REPLACEMENT (AVR);  Surgeon: Melrose Nakayama, MD;  Location: [place];  Service: Open Heart Surgery;  Laterality: N/A;  . Esophagogastroduodenoscopy N/A 07/31/2013    Procedure: ESOPHAGOGASTRODUODENOSCOPY (EGD);  Surgeon: Milus Banister, MD;  Location: [place];  Service: Endoscopy;  Laterality: N/A;  . Esophagogastroduodenoscopy Left 01/11/2014    Procedure: ESOPHAGOGASTRODUODENOSCOPY (EGD);  Surgeon: Juanita Craver, MD;  Location: [facility] ENDOSCOPY;  Service: Endoscopy;  Laterality: Left;    Allergies  Allergen Reactions  . Codeine Other (See Comments)    Hurting in chest  .  Diltiazem Hcl Itching  . Niacin Other (See Comments)    Hot flashes   I have reviewed the patient's current medications . atorvastatin  20 mg Oral q morning - 10a  . colchicine  0.6 mg Oral BID  . ferrous gluconate  324 mg Oral BID WC  . insulin aspart  0-9 Units Subcutaneous TID WC  . insulin NPH Human  10 Units Subcutaneous BID AC & HS  . metoprolol succinate  100 mg Oral BID  . [START ON 02/01/2014] pantoprazole (PROTONIX) IV  40 mg Intravenous Q12H  . sodium chloride  3 mL Intravenous Q12H  . verapamil  360 mg Oral Daily   . pantoprozole (PROTONIX) infusion 8 mg/hr (01/30/14 0523)   acetaminophen, acetaminophen, metoprolol, ondansetron (ZOFRAN) IV, ondansetron  Prior to Admission medications   Medication Sig Start Date End Date Taking? Authorizing Provider  atorvastatin (LIPITOR) 20 MG tablet Take 20 mg by mouth every morning.    Yes Historical Provider, MD  colchicine 0.6 MG tablet Take 1 tablet (0.6 mg total) by mouth 2 (two) times daily. 01/14/14  Yes Thurnell Lose, MD  ferrous gluconate (FERGON) 325 MG tablet Take 325 mg by mouth 2 (two) times daily.    Yes Historical Provider, MD  insulin NPH Human (HUMULIN N,NOVOLIN N) 100 UNIT/ML injection Inject 20-25 Units into the skin 2 (two) times daily before a meal.   Yes Historical Provider, MD  insulin regular (NOVOLIN R,HUMULIN R) 100 units/mL injection Inject 20-25 Units into the skin 2 (two) times daily.   Yes Historical Provider, MD  losartan (COZAAR) 50 MG tablet Take 50 mg by mouth daily.   Yes Historical Provider, MD  metFORMIN (GLUCOPHAGE) 1000 MG tablet Take 1,000 mg by mouth 2 (two) times daily with a meal.    Yes Historical Provider, MD  metolazone (ZAROXOLYN) 2.5 MG tablet Take 2.5 mg by mouth every Monday, Wednesday, and Friday.   Yes Historical Provider, MD  metoprolol succinate (TOPROL-XL) 100 MG 24 hr tablet Take 1 tablet (100 mg total) by mouth 2 (two) times daily. Take with or immediately following a meal. 11/05/13   Yes Amy D Clegg, NP  Multiple Vitamins-Minerals ([employer] SPECTRAVITE ADULT 50+ PO) Take 1 tablet by mouth daily.   Yes Historical Provider, MD  pantoprazole (PROTONIX) 40 MG tablet Take 1 tablet (40 mg total) by mouth 2 (two) times daily. 01/14/14  Yes Thurnell Lose, MD  potassium chloride (K-DUR,KLOR-CON) 10 MEQ tablet Take 60 mEq by mouth 2 (two) times daily.   Yes Historical Provider, MD  torsemide (DEMADEX) 20 MG tablet Take 120 mg by mouth 2 (two) times daily.   Yes Historical Provider, MD  verapamil (COVERA HS) 180 MG (CO) 24 hr tablet Take 360 mg by mouth every morning.   Yes Historical Provider, MD  vitamin  C (ASCORBIC ACID) 500 MG tablet Take 500 mg by mouth 2 (two) times daily.   Yes Historical Provider, MD     History   Social History  . Marital Status: Married    Spouse Name: N/A    Number of Children: 3  . Years of Education: N/A   Occupational History  .      [employer] at [employer] History Main Topics  . Smoking status: Former Smoker -- 1.00 packs/day for 30 years    Types: Cigarettes    Quit date: 07/25/2000  . Smokeless tobacco: Never Used     Comment: Quit smoking in 2002  . Alcohol Use: 0.6 oz/week    1 Shots of liquor per week     Comment: beer 1 week ago  . Drug Use: No  . Sexual Activity: Yes   Other Topics Concern  . Not on file   Social History Narrative   Lives in [place], [place] with wife.     Family Status  Relation Status Death Age  . Father Deceased     Parkinson's, old age  . Sister Alive     good health  . Maternal Aunt Deceased   . Maternal Aunt Deceased   . Son Alive   . Daughter Alive   . Mother Deceased     bleed to death: leg  . Brother Alive     good health  . Sister Deceased     COPD  . Brother Alive     good health  . Daughter Alive     good health   Family History  Problem Relation Age of Onset  . Coronary artery disease      FAMILY HISTORY  . Hypertension Father   . Diabetes Father   . Heart disease  Father   . COPD Sister   . Cancer Maternal Aunt     Breast cancer   . Cancer Maternal Aunt     Breast cancer   . Diabetes Son   . Cancer Daughter     Cervical cancer     ROS:  Full 14 point review of systems complete and found to be negative unless listed above.  Physical Exam: Blood pressure 113/68, pulse 125, temperature 98.1 F (36.7 C), temperature source Oral, resp. rate 13, height 5' 8.11" (1.73 m), weight 261 lb 14.5 oz (118.8 kg), SpO2 98.00%.  General: Well developed, obese, male in no acute distress sitting at the edge of the bed. Head: Eyes PERRLA, No xanthomas.   Normocephalic and atraumatic, oropharynx without edema or exudate. Lungs: clear to auscultation bilaterally. No wheezes, rhonchi or rales.. Heart: Heart irregular rate and rhythm with S1, S2  Questionable systolic murmur. pulses are 2+ all 4 extrem.   Neck: No carotid bruits. No lymphadenopathy.  JVD mildly elevated. Abdomen: Bowel sounds present, abdomen soft and non-tender without masses or hernias noted. Msk: Age appropriate muscle tone and bulk. Extremities: No clubbing or cyanosis. No edema.  Neuro: Alert and oriented X 3. No focal deficits noted. Psych:  Good affect, responds appropriately Skin: No rashes or lesions noted.  Labs:   Lab Results  Component Value Date   WBC 4.5 01/30/2014   HGB 9.1* 01/30/2014   HCT 28.9* 01/30/2014   MCV 89.8 01/30/2014   PLT 137* 01/30/2014    Recent Labs Lab 01/30/14 0009  NA 136*  K 3.5*  CL 91*  CO2 28  BUN 58*  CREATININE 1.84*  CALCIUM 9.0  PROT 7.0  BILITOT 1.7*  ALKPHOS 74  ALT 53  AST 51*  GLUCOSE 152*  ALBUMIN 3.7   Magnesium  Date Value Ref Range Status  01/30/2014 1.7  1.5 - 2.5 mg/dL Final   Echo: 03/08/2013 Study Conclusions - Left ventricle: The cavity size was mildly dilated. Wall thickness was increased in a pattern of mild LVH. Systolic function was normal. The estimated ejection fraction was in the range of 55% to 60%. - Aortic valve:  AV posthesis is difficult to see.Peak and mean gradients through the valve are 40 and 22 mm Hg respectively . This is relatively unchanged from echo of 1 year ago. - Right ventricle: Systolic function was mildly reduced. - Right atrium: The atrium was mildly dilated. - Pulmonary arteries: PA peak pressure: 3mm Hg (S). Impressions:  - No significant change from echo of 2013.   ECG:  A-fib with RVR, right BBB  Radiology:  none   ASSESSMENT AND PLAN:   The patient was seen today by Dr Acie Fredrickson, the patient evaluated and the data reviewed.   Active Problems:   Chronic diastolic CHF (congestive heart failure) - Due to hypotension in the ED, torsemide was decreased and held Cozaar - follow I/O, daily weights    Atrial fibrillation with RVR - taking Toprol-XL 100mg  BID and verapamil CR 360 mg QD at home  - anticoagulation: had previously been on trials of coumadin, aspirin 325mg  and aspirin 81mg  all of which caused GI bleeding.  - chronic issue since 2003    CAD s/p CABG 2004    DM2: Hgb A1c 5.8 on 6/19    Symptomatic anemia: Hgb stable at 9.1, history of iron deficiency    Renal failure (ARF) CKD stage III, acute on chronic: baseline Cr around 1.5.  Follow BMP.    GI bleed: followed by IM    COPD - per IM    Signed: Anson Crofts, PA-S  Seen and agreed with changes made. Rosaria Ferries, PA-C 01/30/2014 11:06 AM Beeper [phone]  Co-Sign MD   Attending Note:   The patient was seen and examined.  Agree with assessment and plan as noted above.  Changes made to the above note as needed.  The patient has chronic A-fib with RVR.  He has been seen by Dr. Burt Knack and Dr. Haroldine Laws and they have been actively working on his rate controlling mechanism.   This tachycardia has been exacerbated by his anemia.  At this point, I would not make any changes except for what is needed to address his anemia and GI bleed.     Will sign off.  Call for questions.    Thayer Headings,  Brooke Bonito., MD, [facility] 01/30/2014, 2:27 PM

## 2014-01-31 LAB — BASIC METABOLIC PANEL
Anion gap: 18 — ABNORMAL HIGH (ref 5–15)
BUN: 38 mg/dL — ABNORMAL HIGH (ref 6–23)
CHLORIDE: 93 meq/L — AB (ref 96–112)
CO2: 27 meq/L (ref 19–32)
Calcium: 9 mg/dL (ref 8.4–10.5)
Creatinine, Ser: 1.55 mg/dL — ABNORMAL HIGH (ref 0.50–1.35)
GFR calc Af Amer: 53 mL/min — ABNORMAL LOW (ref >90)
GFR calc non Af Amer: 45 mL/min — ABNORMAL LOW (ref >90)
GLUCOSE: 146 mg/dL — AB (ref 70–99)
POTASSIUM: 3.1 meq/L — AB (ref 3.7–5.3)
Sodium: 138 mEq/L (ref 137–147)

## 2014-01-31 LAB — GLUCOSE, CAPILLARY
GLUCOSE-CAPILLARY: 229 mg/dL — AB (ref 70–99)
GLUCOSE-CAPILLARY: 212 mg/dL — AB (ref 70–99)
GLUCOSE-CAPILLARY: 182 mg/dL — AB (ref 70–99)
Glucose-Capillary: 189 mg/dL — ABNORMAL HIGH (ref 70–99)

## 2014-01-31 LAB — CBC
HCT: 27.6 % — ABNORMAL LOW (ref 39.0–52.0)
HCT: 28.2 % — ABNORMAL LOW (ref 39.0–52.0)
HEMATOCRIT: 28.1 % — AB (ref 39.0–52.0)
HEMOGLOBIN: 8.6 g/dL — AB (ref 13.0–17.0)
HEMOGLOBIN: 8.7 g/dL — AB (ref 13.0–17.0)
Hemoglobin: 8.9 g/dL — ABNORMAL LOW (ref 13.0–17.0)
MCH: 27.2 pg (ref 26.0–34.0)
MCH: 27.4 pg (ref 26.0–34.0)
MCH: 28.1 pg (ref 26.0–34.0)
MCHC: 31.2 g/dL (ref 30.0–36.0)
MCHC: 31.7 g/dL (ref 30.0–36.0)
MCHC: 30.9 g/dL (ref 30.0–36.0)
MCV: 87.9 fL (ref 78.0–100.0)
MCV: 88.1 fL (ref 78.0–100.0)
MCV: 88.6 fL (ref 78.0–100.0)
PLATELETS: 132 10*3/uL — AB (ref 150–400)
PLATELETS: 142 10*3/uL — AB (ref 150–400)
Platelets: 133 10*3/uL — ABNORMAL LOW (ref 150–400)
RBC: 3.14 MIL/uL — AB (ref 4.22–5.81)
RBC: 3.17 MIL/uL — ABNORMAL LOW (ref 4.22–5.81)
RBC: 3.2 MIL/uL — ABNORMAL LOW (ref 4.22–5.81)
RDW: 17.5 % — ABNORMAL HIGH (ref 11.5–15.5)
RDW: 17.7 % — AB (ref 11.5–15.5)
RDW: 17.6 % — ABNORMAL HIGH (ref 11.5–15.5)
WBC: 4.4 10*3/uL (ref 4.0–10.5)
WBC: 4.1 10*3/uL (ref 4.0–10.5)
WBC: 5.3 10*3/uL (ref 4.0–10.5)

## 2014-01-31 MED ORDER — POTASSIUM CHLORIDE CRYS ER 20 MEQ PO TBCR
40.0000 meq | EXTENDED_RELEASE_TABLET | Freq: Two times a day (BID) | ORAL | Status: DC
  Administered 2014-01-31 – 2014-02-01 (×2): 40 meq via ORAL
  Filled 2014-01-31 (×3): qty 2

## 2014-01-31 NOTE — Progress Notes (Signed)
Transferred patient to 2 [facility] room 2W11 via wheelchair. Oriented to room and call bell placed within reach. Family present at bedside. Gave report to Angelica [employer].

## 2014-01-31 NOTE — Care Management Note (Signed)
    Page 1 of 1   01/31/2014     9:50:00 AM CARE MANAGEMENT NOTE 01/31/2014  Patient:  [name], [name]   Account Number:  [id number]  Date Initiated:  01/29/2014  Documentation initiated by:  [facility]  Subjective/Objective Assessment:   Readmitted with GIB     Action/Plan:   Anticipated DC Date:  01/31/2014   Anticipated DC Plan:  [place]  CM consult      Choice offered to / List presented to:             Status of service:  Completed, signed off Medicare Important Message given?  YES (If response is "NO", the following Medicare IM given date fields will be blank) Date Medicare IM given:  01/31/2014 Medicare IM given by:  Elissa Hefty Date Additional Medicare IM given:   Additional Medicare IM given by:    Discharge Disposition:    Per UR Regulation:  Reviewed for med. necessity/level of care/duration of stay  If discussed at [place] of Stay Meetings, dates discussed:    Comments:  ContactRosemary, [place] [phone]   [phone]                 Hawks,Crystal Daughter [phone]

## 2014-01-31 NOTE — Progress Notes (Signed)
Patient evaluated for community based chronic disease management services with [facility] as a benefit of patient's [employer]. Spoke with patient at bedside to explain [place] [facility] services.  Patient has accepted services with written consent.  Wife Tamela Oddi is his authorized contact.  He would like a social work assessment at home for [employer] as his daughter and son of the home both have chronic illnesses that create financial and emotional strain.  Patient will receive a post discharge transition of care call and will be evaluated for monthly home visits for assessments and disease process education.  Left contact information and [facility] literature at bedside. Made Inpatient Case Manager aware that [place] [facility] following. Of note, [facility] Care Management services does not replace or interfere with any services that are arranged by inpatient case management or social work.  For additional questions or referrals please contact Corliss Blacker BSN RN [facility]iaison at [phone].

## 2014-01-31 NOTE — Progress Notes (Signed)
Patient reported that since he is not taking his regular diuretic he is not voiding as well.  He complains that his abdomen feels full and is making it difficult for him to breathe.  He reports that he must sit on the edge of the bed because if he tries to lie down he feels like he's choking. MD, Ebony Hail, notified and informed this writer that the patient's blood pressure is still too low to restart the diuretic at this time.  She advised that we continue to monitor the patient and if the shortness of breath worsened, then a one time dose of a diuretic may be considered.  Patient's lungs sound clear at this time and he reports that he is uncomfortable but he is in no apparent distress.  Will continue to monitor.    Devion Chriscoe,RN

## 2014-01-31 NOTE — Progress Notes (Signed)
[place] TEAM 1 - Stepdown/ICU TEAM Progress Note  [name] [id number] DOB: 1945-08-07 DOA: 01/28/2014 PCP: [name], MD  Admit HPI / Brief Narrative: 68 y.o. male who was recently admitted for GI bleed requiring transfusion of PRBC.  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  He presented to the ER again because he had been feeling weak and short of breath on exertion over 3 days time. He also reported a bloody bowel movement 3 days prior to admit. Patient noted off and on feeling of dizziness particularly when he stood up.   In the ER patient's hemoglobin was found to be 7.6 - down from 10.5 2 weeks ago prior. Patient was also found to be in A. fib with RVR.   HPI/Subjective: No CP or SOB - says stools are now mostly brown.  Assessment/Plan:  GIB -EGD 6/20 noted antral gastritis, ?GAVE, and an isolated gastric polyp in the midbody -7/9  Dr. Sloan Leiter spoke with Dr. Collene Mares who said no add'l GI work up indicated at this time unless shows signs of active bleeding such as bright red blood or sudden decrease in Hgb -advancing diet, c/w PPI -BUN still up so suspect is slowly oozing - follow   Acute blood loss anemia  -Hgb has drifted down from 9.1 to 8.6 (7/10) - consideration given to possibly transfusing at least one more unit PRBCs before dc to allow a "buffer" to suspected recurrent GI ooze - recheck CBC this evening and in AM   Acute on Chronic Afib w/ acute RVR  -now with resting HRs 120-140s despite high dose BB and CCB -Cardiology consulted-per cards-Heart rate chronically elevated. Not a candidate for anticoagulation given GI bleed -pt says rate control and associated CHF have been chronic issues and Cards documents nothing further to add this admit- OP note from  6/25 per Dr. 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 HR 110s again despite Toprol XL 100 mg bid and  Verapamil 240 mg daily. Will increase verapamil to 360 mg daily. We will also get a holter monitor to assess average HR. Suspect elevated heart rate is contributing to CHF. He does not have many good options here is medication will not work. He cannot be cardioverted safely as he cannot be on anticoagulation with recurrent GI bleeds. AV nodal ablation and pacing would probably be the only other option. If Holter shows high HR average after increasing verapamil, will refer to EP.  Acute on Chronic kidney disease stage III -baseline creatinine is around 1.5  NASH w/ Chronic thrombocytopenia  -no esophageal varices on recent EGD   CHF -ECHO 2014 with preserved LV fnx -currently compensated and suspect exacerbations are from RVR/tachycardia -Pt notes 7/10 sensation of abdominal fullness and LE edema - on Demadex and Zaroxlyn at home- unfortunately BP remains soft in low 100s and with ongoing tachycardia reluctant to resume diuretics at this time - no SOB or hypoxia - may eventually require prn diuretic if develops resp sxs  DM2 -CBGs controlled -HgbA1c 5.8 on 6/19  CAD s/p CABG 2004 Grafts patent via cardiac cath 2013  COPD -compensated  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  Code Status: FULL Family Communication: Wife at bedside Disposition Plan: Transfer to telemetry but keep on Team 1 for possible dc 7/11 (if hgb stable)  Consultants: GI (Telephone)  CARDIOLOGY  Procedures: none  Antibiotics: none  DVT prophylaxis: SCDs  Objective: Blood pressure 114/75, pulse 96, temperature 98 F (36.7 C), temperature source Oral, resp. rate 18, height 5' 8.11" (1.73 m), weight 120 kg (264 lb 8.8 oz), SpO2 97.00%.  Intake/Output Summary (Last 24 hours) at 01/31/14 1710 Last data filed at 01/31/14 1412  Gross per 24 hour  Intake   1210 ml  Output   1100 ml  Net    110 ml   Exam: General: No acute respiratory  distress Lungs: Clear to auscultation bilaterally without wheezes or crackles Cardiovascular: Irregular rate (rates transiently up to 140s at rest) and rhythm without murmur gallop or rub normal S1 and S2, no peripheral edema or JVD Abdomen: Nontender, nondistended, soft, bowel sounds positive, no rebound, no ascites, no appreciable mass Musculoskeletal: No significant cyanosis or clubbing of bilateral lower extremities  Data Reviewed: Basic Metabolic Panel:  Recent Labs Lab 01/28/14 2022 01/29/14 1038 01/30/14 0009 01/31/14 0450  NA 138 140 136* 138  K 3.6* 3.4* 3.5* 3.1*  CL 91* 91* 91* 93*  CO2 28 30 28 27   GLUCOSE 200* 209* 152* 146*  BUN 71* 65* 58* 38*  CREATININE 2.25* 1.91* 1.84* 1.55*  CALCIUM 9.3 9.5 9.0 9.0  MG  --   --  1.7  --    Liver Function Tests:  Recent Labs Lab 01/28/14 2022 01/29/14 1038 01/30/14 0009  AST 32 44* 51*  ALT 42 47 53  ALKPHOS 77 78 74  BILITOT 0.6 3.1* 1.7*  PROT 7.3 7.3 7.0  ALBUMIN 3.7 3.8 3.7   CBC:  Recent Labs Lab 01/28/14 2022 01/29/14 1038 01/30/14 01/31/14 0450  WBC 4.8 5.4 4.5 4.4  NEUTROABS  --  4.1  --   --   HGB 7.6* 9.0* 9.1* 8.6*  HCT 24.9* 29.1* 28.9* 27.6*  MCV 90.5 88.7 89.8 87.9  PLT 143* 130* 137* 133*   CBG:  Recent Labs Lab 01/30/14 1650 01/30/14 2202 01/31/14 0823 01/31/14 1149 01/31/14 1626  GLUCAP 239* 177* 189* 229* 212*    Recent Results (from the past 240 hour(s))  MRSA PCR SCREENING     Status: None   Collection Time    01/29/14  5:08 AM      Result Value Ref Range Status   MRSA by PCR NEGATIVE  NEGATIVE Final   Comment:            The GeneXpert MRSA Assay (FDA     approved for NASAL specimens     only), is one component of a     comprehensive MRSA colonization     surveillance program. It is not     intended to diagnose MRSA     infection nor to guide or     monitor treatment for     MRSA infections.    Studies:  Recent x-ray studies have been reviewed in detail by the  Attending Physician  Scheduled Meds:  Scheduled Meds: . atorvastatin  20 mg Oral q morning - 10a  . colchicine  0.6 mg Oral BID  . ferrous gluconate  324 mg Oral BID WC  . insulin aspart  0-9 Units Subcutaneous TID WC  . insulin NPH Human  10 Units Subcutaneous BID AC & HS  . metoprolol succinate  100 mg Oral BID  . [START ON 02/01/2014] pantoprazole (PROTONIX) IV  40 mg Intravenous Q12H  . sodium chloride  3 mL Intravenous Q12H  . verapamil  360 mg Oral Daily  Time spent on care of this patient: 35 mins  Neima Lacross T , ANP  [facility] Office  [phone] Pager - Text Page per Shea Evans as per below:  On-Call/Text Page:      [email or web address]      password [email or web address]  If 7PM-7AM, please contact night-coverage [email or web address] Password [email or web address] 01/31/2014, 5:10 PM   LOS: 3 days   I have personally examined this patient and reviewed the entire database. I have reviewed the above note, made any necessary editorial changes, and agree with its content.  Cherene Altes, MD [facility]

## 2014-02-01 LAB — TYPE AND SCREEN
ABO/RH(D): A POS
ANTIBODY SCREEN: POSITIVE
DAT, IgG: NEGATIVE
DONOR AG TYPE: NEGATIVE
DONOR AG TYPE: NEGATIVE
Donor AG Type: NEGATIVE
Donor AG Type: NEGATIVE
UNIT DIVISION: 0
Unit division: 0
Unit division: 0
Unit division: 0

## 2014-02-01 LAB — BASIC METABOLIC PANEL
ANION GAP: 16 — AB (ref 5–15)
BUN: 32 mg/dL — ABNORMAL HIGH (ref 6–23)
CHLORIDE: 94 meq/L — AB (ref 96–112)
CO2: 26 meq/L (ref 19–32)
Calcium: 8.8 mg/dL (ref 8.4–10.5)
Creatinine, Ser: 1.49 mg/dL — ABNORMAL HIGH (ref 0.50–1.35)
GFR calc Af Amer: 55 mL/min — ABNORMAL LOW (ref >90)
GFR calc non Af Amer: 48 mL/min — ABNORMAL LOW (ref >90)
Glucose, Bld: 167 mg/dL — ABNORMAL HIGH (ref 70–99)
POTASSIUM: 3.3 meq/L — AB (ref 3.7–5.3)
SODIUM: 136 meq/L — AB (ref 137–147)

## 2014-02-01 LAB — GLUCOSE, CAPILLARY
GLUCOSE-CAPILLARY: 146 mg/dL — AB (ref 70–99)
Glucose-Capillary: 179 mg/dL — ABNORMAL HIGH (ref 70–99)

## 2014-02-01 MED ORDER — LOSARTAN POTASSIUM 50 MG PO TABS: 25.0000 mg | ORAL_TABLET | Freq: Every day | ORAL | Status: DC

## 2014-02-01 NOTE — Discharge Summary (Signed)
DISCHARGE SUMMARY  [name]  MR#: [id number]  DOB:13-Jun-1946  Date of Admission: 01/28/2014 Date of Discharge: 02/01/2014  Attending Physician:Erric Machnik T  Patient's PZW:[name], [name]  Consults: Cardiology  Disposition: d/c home   Follow-up Appts:     Follow-up Information   Follow up with [name], [name]. Schedule an appointment as soon as possible for a visit in 1 week.   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   Initial presentation: 68 y.o. male who was recently admitted for GI bleed requiring transfusion of PRBC. 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 He presented to the ER again 01/28/2014 because he had been feeling weak and short of breath on exertion over 3 days time. He also reported a bloody bowel movement 3 days prior to admit. Patient noted off and on feeling of dizziness particularly when he stood up.   In the ER patient's hemoglobin was found to be 7.6 - down from 10.5 two weeks prior. Patient was also found to be in A. fib with RVR.   Hospital Course:  GIB  -EGD 6/20 noted antral gastritis, ?GAVE, and an isolated gastric polyp in the midbody  -7/9 Dr. Sloan Leiter spoke with Dr. Collene Mares who said no add'l GI work up indicated at this time unless shows signs of active bleeding such as bright red blood or sudden decrease in Hgb  -advanced diet w/o difficulty, continued PPI  -BUN slowly improving at time of d/c   Acute blood loss anemia  -pt required 2U PRBC total during this  hospital stay  -Hgb stable/improving at time of d/c   Acute on Chronic Afib w/ acute RVR  -resting HRs 120-140s despite high dose BB and CCB  -Cardiology consulted - per Card heart rate chronically elevated -Not a candidate for anticoagulation given GI bleed  -pt says rate control and associated CHF have been chronic issues and Cards documents nothing further to add this admit -OP note from 6/25 per Dr. 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 HR 110s again despite Toprol XL 100 mg bid and Verapamil 240 mg daily. Will increase verapamil to 360 mg daily. We will also get a holter monitor to assess average HR. Suspect elevated heart rate is contributing to CHF. He does not have many good options here is medication will not work. He cannot be cardioverted safely as he cannot be on anticoagulation with recurrent GI bleeds. AV nodal ablation and pacing would probably be the only other option. If Holter shows high HR average after increasing verapamil, will refer to EP.  -pt instructed on importance of keeping f/u in CHF clinic as previously arranged  Acute on Chronic kidney disease stage III  -baseline creatinine is around 1.5  -crt at baseline at time of d/c   NASH w/ Chronic thrombocytopenia  -no esophageal varices on recent EGD   CHF  -ECHO 2014 with  preserved LV fnx  -currently compensated and suspect exacerbations are from RVR/tachycardia  -Pt notes 7/10 sensation of abdominal fullness and LE edema - on Demadex and Zaroxlyn at home- unfortunately BP remains soft in low 100s and with ongoing tachycardia reluctant to resume diuretics at this time - no SOB or hypoxia - may eventually require prn diuretic if develops resp sxs  -resume ARB at d/c, but at half usual dose due to moderate hypotension in setting of renal insuff  DM2  -CBGs reasonably controlled during inpt stay  -HgbA1c 5.8 on 6/19   CAD s/p CABG 2004  Grafts  patent via cardiac cath 2013 - no cp during hospital stay   COPD  -compensated   Hostory of [place] 2013 (bioprosthetic) due to AoS   Obesity - Body mass index is 40.09 kg/(m^2).     Medication List         atorvastatin 20 MG tablet  Commonly known as:  LIPITOR  Take 20 mg by mouth every morning.     colchicine 0.6 MG tablet  Take 1 tablet (0.6 mg total) by mouth 2 (two) times daily.     [employer] SPECTRAVITE ADULT 50+ PO  Take 1 tablet by mouth daily.     ferrous gluconate 325 MG tablet  Commonly known as:  FERGON  Take 325 mg by mouth 2 (two) times daily.     insulin NPH Human 100 UNIT/ML injection  Commonly known as:  HUMULIN N,NOVOLIN N  Inject 20-25 Units into the skin 2 (two) times daily before a meal.     insulin regular 100 units/mL injection  Commonly known as:  NOVOLIN R,HUMULIN R  Inject 20-25 Units into the skin 2 (two) times daily.     losartan 50 MG tablet  Commonly known as:  COZAAR  Take 0.5 tablets (25 mg total) by mouth daily.     metFORMIN 1000 MG tablet  Commonly known as:  GLUCOPHAGE  Take 1,000 mg by mouth 2 (two) times daily with a meal.     metolazone 2.5 MG tablet  Commonly known as:  ZAROXOLYN  Take 2.5 mg by mouth every Monday, Wednesday, and Friday.     metoprolol succinate 100 MG 24 hr tablet  Commonly known as:  TOPROL-XL  Take 1 tablet (100 mg total) by mouth 2 (two) times daily. Take with or immediately following a meal.     pantoprazole 40 MG tablet  Commonly known as:  PROTONIX  Take 1 tablet (40 mg total) by mouth 2 (two) times daily.     potassium chloride 10 MEQ tablet  Commonly known as:  K-DUR,KLOR-CON  Take 60 mEq by mouth 2 (two) times daily.     torsemide 20 MG tablet  Commonly known as:  DEMADEX  Take 120 mg by mouth 2 (two) times daily.     verapamil 180 MG (CO) 24 hr tablet  Commonly known as:  COVERA HS  Take 360 mg by mouth every morning.     vitamin C 500 MG tablet  Commonly known as:  ASCORBIC ACID  Take 500  mg by mouth 2 (two) times daily.       Day of Discharge BP 107/65  Pulse 109  Temp(Src) 98.2 F (36.8 C) (Oral)  Resp 20  Ht 5' 8.11" (1.73 m)  Wt 112.084 kg (247 lb 1.6 oz)  BMI 37.45 kg/m2  SpO2 100%  Physical Exam: General: No acute respiratory distress Lungs: Clear to auscultation bilaterally without wheezes or crackles Cardiovascular:  tachycardic - no gallup or rub  Abdomen: Nontender, nondistended, soft, bowel sounds positive, no rebound, no ascites, no appreciable mass Extremities: No significant cyanosis, or clubbing;  1+ edema bilateral lower extremities  Results for orders placed during the hospital encounter of 01/28/14 (from the past 24 hour(s))  GLUCOSE, CAPILLARY     Status: Abnormal   Collection Time    01/31/14  4:26 PM      Result Value Ref Range   Glucose-Capillary 212 (*) 70 - 99 mg/dL   Comment 1 Documented in Chart     Comment 2 Notify RN    CBC     Status: Abnormal   Collection Time    01/31/14  6:21 PM      Result Value Ref Range   WBC 5.3  4.0 - 10.5 K/uL   RBC 3.20 (*) 4.22 - 5.81 MIL/uL   Hemoglobin 8.7 (*) 13.0 - 17.0 g/dL   HCT 28.2 (*) 39.0 - 52.0 %   MCV 88.1  78.0 - 100.0 fL   MCH 27.2  26.0 - 34.0 pg   MCHC 30.9  30.0 - 36.0 g/dL   RDW 17.6 (*) 11.5 - 15.5 %   Platelets 142 (*) 150 - 400 K/uL  GLUCOSE, CAPILLARY     Status: Abnormal   Collection Time    01/31/14  9:27 PM      Result Value Ref Range   Glucose-Capillary 182 (*) 70 - 99 mg/dL   Comment 1 Documented in Chart     Comment 2 Notify RN    CBC     Status: Abnormal   Collection Time    01/31/14 11:45 PM      Result Value Ref Range   WBC 4.1  4.0 - 10.5 K/uL   RBC 3.17 (*) 4.22 - 5.81 MIL/uL   Hemoglobin 8.9 (*) 13.0 - 17.0 g/dL   HCT 28.1 (*) 39.0 - 52.0 %   MCV 88.6  78.0 - 100.0 fL   MCH 28.1  26.0 - 34.0 pg   MCHC 31.7  30.0 - 36.0 g/dL   RDW 17.7 (*) 11.5 - 15.5 %   Platelets 132 (*) 150 - 400 K/uL  BASIC METABOLIC PANEL     Status: Abnormal   Collection Time      01/31/14 11:45 PM      Result Value Ref Range   Sodium 136 (*) 137 - 147 mEq/L   Potassium 3.3 (*) 3.7 - 5.3 mEq/L   Chloride 94 (*) 96 - 112 mEq/L   CO2 26  19 - 32 mEq/L   Glucose, Bld 167 (*) 70 - 99 mg/dL   BUN 32 (*) 6 - 23 mg/dL   Creatinine, Ser 1.49 (*) 0.50 - 1.35 mg/dL   Calcium 8.8  8.4 - 10.5 mg/dL   GFR calc non Af Amer 48 (*) >90 mL/min   GFR calc Af Amer 55 (*) >90 mL/min   Anion gap 16 (*) 5 - 15  GLUCOSE, CAPILLARY     Status: Abnormal   Collection Time    02/01/14  6:16 AM      Result Value Ref Range   Glucose-Capillary 146 (*) 70 - 99 mg/dL   Comment 1 Documented in Chart     Comment 2 Notify RN    GLUCOSE, CAPILLARY     Status: Abnormal   Collection Time    02/01/14 11:33 AM      Result Value Ref Range   Glucose-Capillary 179 (*) 70 -  99 mg/dL    Time spent in discharge (includes decision making & examination of pt): > 30 minutes  02/01/2014, 12:38 PM   Cherene Altes, [name] [facility] Office  [phone] Pager [phone]  On-Call/Text Page:      [email or web address]      password [facility]

## 2014-02-01 NOTE — Progress Notes (Signed)
Discharged to home with family office visits in place teaching done  

## 2014-02-01 NOTE — Discharge Instructions (Signed)
Gastrointestinal Bleeding °Gastrointestinal bleeding is bleeding somewhere along the path that food travels through the body (digestive tract). This path is anywhere between the mouth and the opening of the butt (anus). You may have blood in your throw up (vomit) or in your poop (stools). If there is a lot of bleeding, you may need to stay in the hospital. °HOME CARE °· Only take medicine as told by your doctor. °· Eat foods with fiber such as whole grains, fruits, and vegetables. You can also try eating 1 to 3 prunes a day. °· Drink enough fluids to keep your pee (urine) clear or pale yellow. °GET HELP RIGHT AWAY IF:  °· Your bleeding gets worse. °· You feel dizzy, weak, or you pass out (faint). °· You have bad cramps in your back or belly (abdomen). °· You have large blood clumps (clots) in your poop. °· Your problems are getting worse. °MAKE SURE YOU:  °· Understand these instructions. °· Will watch your condition. °· Will get help right away if you are not doing well or get worse. °Document Released: 04/19/2008 Document Revised: 06/27/2012 Document Reviewed: 06/20/2011 °ExitCare® Patient Information ©2015 [employer]. This information is not intended to replace advice given to you by your health care provider. Make sure you discuss any questions you have with your health care provider. ° ° °

## 2014-02-03 ENCOUNTER — Telehealth (INDEPENDENT_AMBULATORY_CARE_PROVIDER_SITE_OTHER): Payer: Self-pay | Admitting: *Deleted

## 2014-02-03 DIAGNOSIS — K7030 Alcoholic cirrhosis of liver without ascites: Secondary | ICD-10-CM

## 2014-02-03 DIAGNOSIS — D509 Iron deficiency anemia, unspecified: Secondary | ICD-10-CM

## 2014-02-03 NOTE — Telephone Encounter (Signed)
Go out of [facility] on 02/01/14. Would like to know if he still needed to have his blood work done. He has an apt with his PCP, Dr. Olen Pel on 02/07/14. The return phone number is [phone].  Will wait until they here from [place] to know what to do.

## 2014-02-03 NOTE — Telephone Encounter (Signed)
[name], you saw the patient July 1,2015. At that time you ordered labs work. Patient has been in the hospital since that time and lab work was done. He would like to know if he we still need what you ordered?

## 2014-02-04 NOTE — Telephone Encounter (Signed)
Needs blood work in 2 week

## 2014-02-04 NOTE — Telephone Encounter (Signed)
Patient called and talked with his wife. She states that he has an appointment with Cardiologist on the 27 th of July , and ask if they could draw labs. She was told yes, that they should be able to see this request in EPIC. If for some reason they cannot ,per wife , she will come to lab here on the 28 th and have the labs drawn. Lab orders have been released to [employer].

## 2014-02-07 DIAGNOSIS — R000 Tachycardia, unspecified: Secondary | ICD-10-CM | POA: Diagnosis not present

## 2014-02-07 DIAGNOSIS — I10 Essential (primary) hypertension: Secondary | ICD-10-CM | POA: Diagnosis not present

## 2014-02-07 DIAGNOSIS — K922 Gastrointestinal hemorrhage, unspecified: Secondary | ICD-10-CM | POA: Diagnosis not present

## 2014-02-11 DIAGNOSIS — R000 Tachycardia, unspecified: Secondary | ICD-10-CM | POA: Diagnosis not present

## 2014-02-11 DIAGNOSIS — R5381 Other malaise: Secondary | ICD-10-CM | POA: Diagnosis not present

## 2014-02-11 DIAGNOSIS — R141 Gas pain: Secondary | ICD-10-CM | POA: Diagnosis not present

## 2014-02-11 DIAGNOSIS — N179 Acute kidney failure, unspecified: Secondary | ICD-10-CM | POA: Diagnosis not present

## 2014-02-11 DIAGNOSIS — R5383 Other fatigue: Secondary | ICD-10-CM | POA: Diagnosis not present

## 2014-02-14 ENCOUNTER — Other Ambulatory Visit (HOSPITAL_COMMUNITY): Payer: Self-pay | Admitting: Internal Medicine

## 2014-02-17 ENCOUNTER — Encounter (HOSPITAL_COMMUNITY): Payer: Self-pay

## 2014-02-17 ENCOUNTER — Ambulatory Visit (HOSPITAL_COMMUNITY)
Admission: RE | Admit: 2014-02-17 | Discharge: 2014-02-17 | Disposition: A | Discharge status: Home / Self Care | Payer: Medicare Other | Source: Ambulatory Visit | Attending: Internal Medicine | Admitting: Internal Medicine

## 2014-02-17 VITALS — BP 105/68 | HR 140 | Resp 20 | Wt 258.2 lb

## 2014-02-17 DIAGNOSIS — I509 Heart failure, unspecified: Secondary | ICD-10-CM | POA: Diagnosis not present

## 2014-02-17 DIAGNOSIS — I10 Essential (primary) hypertension: Secondary | ICD-10-CM | POA: Diagnosis not present

## 2014-02-17 DIAGNOSIS — I4891 Unspecified atrial fibrillation: Secondary | ICD-10-CM

## 2014-02-17 DIAGNOSIS — I5032 Chronic diastolic (congestive) heart failure: Secondary | ICD-10-CM | POA: Diagnosis not present

## 2014-02-17 DIAGNOSIS — D649 Anemia, unspecified: Secondary | ICD-10-CM | POA: Diagnosis not present

## 2014-02-17 DIAGNOSIS — E7800 Pure hypercholesterolemia, unspecified: Secondary | ICD-10-CM | POA: Diagnosis not present

## 2014-02-17 DIAGNOSIS — Z8719 Personal history of other diseases of the digestive system: Secondary | ICD-10-CM

## 2014-02-17 DIAGNOSIS — IMO0001 Reserved for inherently not codable concepts without codable children: Secondary | ICD-10-CM | POA: Diagnosis not present

## 2014-02-17 MED ORDER — AMIODARONE HCL 400 MG PO TABS: 200.0000 mg | ORAL_TABLET | Freq: Two times a day (BID) | ORAL | Status: DC

## 2014-02-17 NOTE — Patient Instructions (Addendum)
Will start amiodarone 400 mg twice a day.  Will follow up next week.  Will referral EP for atrial fibrillation.   Do the following things EVERYDAY: 1) Weigh yourself in the morning before breakfast. Write it down and keep it in a log. 2) Take your medicines as prescribed 3) Eat low salt foods-Limit salt (sodium) to 2000 mg per day.  4) Stay as active as you can everyday 5) Limit all fluids for the day to less than 2 liters 6)

## 2014-02-17 NOTE — Progress Notes (Addendum)
Patient ID: [name], male   DOB: 07/21/1946, 68 y.o.   MRN: [id number] Primary Cardiologist: Dr. Burt Knack GI: Dr Laural Golden- followed for cirrohisis Pulmonary: Dr Melvyn Novas  PCP: Elesa Hacker [facility])  Dr. Bubba Camp (Endocrinologist)  [name] is a 68 yo with history of CAD s/p CABG, permanent atrial fibrillation, chronic diastolic CHF, severe AS s/p bioprosthetic AVR presents for evaluation of diastolic CHF.  Last LHC in 8/13 showed patent grafts.  AVR was in 10/13.  Last echo in 8/14 showed EF 55-60%.    RHC (12/2013): RA 14 RV 44/4/11 PA 50/23 (33) PCWP 20, v = 35 Fick CO/CI: 6.0/2.6 PVR 2.2 WU PA 59%  Follow up for Heart Failure: Since last visit was admitted to the hospital for GI bleed requiring 2 PRBCs. No additional GI work-up with recent EGD showing gastritis. +fatgiue gets weak with just standing. +SOB. Denies PND, orthopnea or CP. Saw Dr. Doyle Askew and reports that he had CBC and that his Hgb was 8.9 and creatinine stable.  Had blood work done with CBC today at endocrinologist and she will sent to [place]. Weight at home 251 lbs. Not able to go around the whole grocery store without stopping.  Following a low salt diet and drinking more than 2L a day.  Labs (8/14) LDL 38 Labs (3/15) K 3.4, creatinine 1.7, BUN 55  Labs (10/15/13) K 4.0 Creatinine 1.6 BUN 32 Labs (10/29/13) K 4.0 Creatinine 1.6  Labs (4/15) K 4.2 Creatinine 1.32 => 1.5, HCT 35.2 Labs (12/11/13) K 3.6 Creatinine 1.56 Pro BNP 1152 Hemoglobin 9.6  Labs (01/12/14) K 3.0, creatinine 2.0, hemoglobin 8.8 Labs (01/31/14) K 3.3, creatinine 1.49  PMH: 1. CAD: s/p CABG 2004. Last LHC (8/13) with patent free radial-OM, patent SVG-D, patent LIMA-LAD.  2. Permanent atrial fibrillation: Not anticoagulated due to GI bleeding.  Holter monitor (4/15) with mean HR 115 (afib).  3. Chronic diastolic CHF: Echo (2/58) with EF 55-60%, bioprosthetic aortic valve with mean gradient 22 mmHg, mildly decreased RV systolic function. RHC (12/2013): RA  14, RV 44/4/11, PA 50/23 (33), PCWP 20, v = 35, Fick CO/CI: 6.0/2.6, PVR 2.2 WU, PA 59% 4. Severe aortic stenosis s/p bioprosthetic AVR in 10/13. Mean gradient 22 mmHg across valve on echo in 8/14.  5. NASH: ascites, thrombocytopenia.  Had paracentesis in 1/15. Gynecomastia with spironolactone.  6. 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  EGD in 1/15 showed mild portal gastropathy and 2 small antral ulcers. Recurrent GI bleed in 6/15, EGD showed gastritis and ?GAVE.  7. Hyperlipidemia 8. HTN 9. GERD 10. COPD 15. OSA: Cannot tolerate CPAP.  12. CKD 13. Gout  SH: Lives with wife in [place], prior smoker.   FH: CAD  ROS: All systems reviewed and negative except as per HPI.   Current Outpatient Prescriptions on File Prior to Encounter  Medication Sig Dispense Refill  . atorvastatin (LIPITOR) 20 MG tablet Take 20 mg by mouth every morning.       . colchicine 0.6 MG tablet Take 1 tablet (0.6 mg total) by mouth 2 (two) times daily.  60 tablet  0  . ferrous gluconate (FERGON) 325 MG tablet Take 325 mg by mouth 2 (two) times daily.       . insulin NPH Human (HUMULIN N,NOVOLIN N) 100 UNIT/ML injection Inject 20-25 Units into the skin 2 (two) times daily before a meal.      . insulin regular (NOVOLIN R,HUMULIN R) 100 units/mL injection Inject 20-25 Units into the skin 2 (  two) times daily.      [name] losartan (COZAAR) 50 MG tablet Take 0.5 tablets (25 mg total) by mouth daily.      . metFORMIN (GLUCOPHAGE) 1000 MG tablet Take 1,000 mg by mouth 2 (two) times daily with a meal.       . metolazone (ZAROXOLYN) 2.5 MG tablet Take 2.5 mg by mouth every Monday, Wednesday, and Friday.      . metoprolol succinate (TOPROL-XL) 100 MG 24 hr tablet Take 1 tablet (100 mg total) by mouth 2 (two) times daily. Take with or immediately following a meal.  60 tablet  6  . Multiple Vitamins-Minerals ([employer]S SPECTRAVITE ADULT 50+ PO) Take 1 tablet by mouth daily.      . pantoprazole (PROTONIX) 40 MG tablet Take 1 tablet (40 mg  total) by mouth 2 (two) times daily.  60 tablet  0  . potassium chloride (K-DUR,KLOR-CON) 10 MEQ tablet Take 60 mEq by mouth 2 (two) times daily.      [name] torsemide (DEMADEX) 20 MG tablet Take 120 mg by mouth 2 (two) times daily.      . verapamil (COVERA HS) 180 MG (CO) 24 hr tablet Take 360 mg by mouth every morning.      . vitamin C (ASCORBIC ACID) 500 MG tablet Take 500 mg by mouth 2 (two) times daily.       No current facility-administered medications on file prior to encounter.    Filed Vitals:   02/17/14 1107  BP: 105/68  Pulse: 140  Resp: 20  Weight: 258 lb 4 oz (117.141 kg)  SpO2: 98%    General: NAD Neck: JVP 7-8, no thyromegaly or thyroid nodule. Wife present  Lungs: Clear to auscultation bilaterally with normal respiratory effort. CV: Nondisplaced PMI.  Tachy, irregular S1/S2, no S3/S4, 2/6 early SEM RUSB.  No peripheral edema.  No carotid bruit.  Normal pedal pulses.  Abdomen: Soft, nontender, no hepatosplenomegaly, mildy distended.  Skin: Intact without lesions or rashes.  Neurologic: Alert and oriented x 3.  Psych: Normal affect. Extremities: No clubbing or cyanosis.   Assessment/Plan:  1. Chronic diastolic CHF:  EF 03-47% (02/2013).   - NYHA IIIb symptoms and volume status stable. Will continue torsemide 120 mg BID and metolazone 2.5 mg on M,W,Friday. Awaiting BMET from Encodrinologist which checked this am. - SBP stable. Will continue current medications.  2. Chronic atrial fibrillation:  CHADSVASC 3 - CHF, HTN, DM2. 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 We tried ASA 325 mg daily, however it was stopped again d/t GIB. Patient recently discharged again with GIB and blood transfusion. HR remains elevated in the 130-149s on Toprol XL 100 mg BID and Verapamil 360 mg daily. Last Holter monitor in 4/15 revealed avg HR of 115.  Suspect elevated heart rate is contributing to CHF.  He does not have many good options here.  He  cannot be cardioverted safely as he cannot be on anticoagulation with recurrent GI bleeds.  AV nodal ablation and pacing would probably be the only other option, will refer back to EP for consideration. Discussed with Dr. Haroldine Laws and in the meantime until appointment with EP will start Amiodarone 400 mg BID for class IIb indication for HR control. Patient understands that he is at risk of being chemically cardioverted and small chance of stroke, however we discussed this is low risk and that currently I am more concerned if HR remains this fast he will  continue to decompensate.  Continue BB and Verapamil at current doses.  3. CKD stage III: baseline Cr 1.5-2.0. Awaiting BMET from earlier today. 4. Bioprosthetic AVR: Probably mild prosthetic valvular stenosis based on most recent echo.  5. CAD: s/p CABG. No chest pain. Continue statin, BB and ARB. He has been off aspirin and anticoagulation with history of GI bleeding, most recently earlier this month.   6. Cirrhosis: NASH-related. Has followup in August Per Dr Laural Golden 7. Anemia: Just readmitted with GI bleed. No longer on ASA. Awaiting CBC results from earlier this am at Endocrinologist's office.       Follow up 2 weeks. EP appointment next week.   Junie Bame B,NP-C 11:19 AM   Patient seen and examined with Junie Bame, NP. We discussed all aspects of the encounter. I agree with the assessment and plan as stated above.   His volume status is relatively well controlled on current regimen but continues to struggle with AF and RVR despite multiple AVN blockers. Agree with referral to EP for consideration of AVN ablation. Unable to anti-coagulate due to recurrent GIB.  Daniel Bensimhon,MD 9:11 PM

## 2014-02-19 ENCOUNTER — Encounter: Payer: Self-pay | Admitting: Internal Medicine

## 2014-02-19 ENCOUNTER — Ambulatory Visit (INDEPENDENT_AMBULATORY_CARE_PROVIDER_SITE_OTHER): Payer: Medicare Other | Admitting: Internal Medicine

## 2014-02-19 VITALS — BP 109/67 | HR 85 | Ht 68.0 in | Wt 257.8 lb

## 2014-02-19 DIAGNOSIS — R400 Somnolence: Secondary | ICD-10-CM

## 2014-02-19 DIAGNOSIS — G4710 Hypersomnia, unspecified: Secondary | ICD-10-CM

## 2014-02-19 DIAGNOSIS — I25810 Atherosclerosis of coronary artery bypass graft(s) without angina pectoris: Secondary | ICD-10-CM

## 2014-02-19 DIAGNOSIS — I4891 Unspecified atrial fibrillation: Secondary | ICD-10-CM

## 2014-02-19 NOTE — Patient Instructions (Signed)
Your physician recommends that you continue on your current medications as directed. Please refer to the Current Medication list given to you today.  Your physician has recommended that you have a sleep study. This test records several body functions during sleep, including: brain activity, eye movement, oxygen and carbon dioxide blood levels, heart rate and rhythm, breathing rate and rhythm, the flow of air through your mouth and nose, snoring, body muscle movements, and chest and belly movement.

## 2014-02-19 NOTE — Progress Notes (Signed)
ELECTROPHYSIOLOGY CONSULT NOTE  Patient ID: [name], MRN: [id number], DOB/AGE: 08-29-1945 68 y.o. Admit date: (Not on file) Date of Consult: 02/19/2014  Primary Physician: [name], MD Primary Cardiologist: CHF  Chief Complaint:  [name] fibrillatoin   HPI [name] is a 68 y.o. male   Referred for questions about augmented rate control in permanent atrial fibrillation.  He is a history of ischemic heart disease with prior bypass surgery. He has a history of severe aortic stenosis with a bioprosthetic aVR. This was accomplished 10/13 with recent echo 8/14 demonstrated normal left ventricular function and surprisingly normal left atrial dimensions  He has a history of recurrent GI bleeding which is precluded the use of oral anticoagulation and this does not making a candidate for cardioversion.  Associated comorbidities include COPD and untreated severe sleep apnea in the context of morbid obesity. He also has chronic anemia  Exercise intolerance is significant. He short of breath at less than about 100 feet. He has had heart rates recorded in the 140 range with minimal exertion.       Past Medical History  Diagnosis Date  . Overweight(278.02)   . CAD (coronary artery disease)     a. s/p CABG 2004;  b. [place] 10/13:  LHC 8/13: Free radial to obtuse marginal patent, SVG-diagonal patent, LIMA-LAD patent, EF 65-70%, mean aortic valve gradient 42  . Atrial fibrillation     Permanent; off of Coumadin for now due to GI bleed  . DM2 (diabetes mellitus, type 2)   . Insomnia   . Carotid bruit 06/14/2011    a. pre-AVR dopplers 10/13: no sig ICA stenosis  . Hypertension   . Chronic diastolic heart failure   . GERD (gastroesophageal reflux disease)   . COPD (chronic obstructive pulmonary disease)   . Iron deficiency anemia     Requiring intravenous iron  . H/O hiatal hernia   . AVM (arteriovenous malformation)     Recurrent GI bleeding requiring multiple  transfusions  . Hyperlipidemia   . Thrombocytopenia   . Ascites     status post paracentesis with removal of 3.4 L of ascitic fluid  . Osteoarthritis   . Mediastinal adenopathy 09/22/2011  . Cirrhosis   . Iron deficiency anemia   . Mediastinal adenopathy 09/22/2011  . Aortic stenosis 03/08/2012    a.  s/p tissue AVR 10/13 with Dr. [name];   b. Echo 10/13: mod LVH, EF 55-60%, tissue AVR not well seen, no leak, gradient not too high (mean 73mmHg), MAC, mild MR, mild LAE, PASP 38  . OSA (obstructive sleep apnea) 1999    DOES NOT USE CPAP      Surgical History:  Past Surgical History  Procedure Laterality Date  . Coronary artery bypass graft   10/15/2002     [name]. [name], M.D.     . Carpal tunnel release    10/08/2003  . Lipoma surgery    . Hernia repair    . Other surgical history  08/26/2011    [facility],  enteroscopy , revealing "three-four AVMs."   . Tee without cardioversion  03/07/2012    Procedure: TRANSESOPHAGEAL ECHOCARDIOGRAM (TEE);  Surgeon: Thayer Headings, MD;  Location: [facility] ENDOSCOPY;  Service: Cardiovascular;  Laterality: N/A;  . Coronary angioplasty with stent placement  01/19/2005    drug eluting stent to high grade ostial stenosis of radial artery graft to OM  . Aortic valve replacement  04/25/2012    Procedure: AORTIC VALVE REPLACEMENT (AVR);  Surgeon: Melrose Nakayama, MD;  Location: [place];  Service: Open Heart Surgery;  Laterality: N/A;  . Esophagogastroduodenoscopy N/A 07/31/2013    Procedure: ESOPHAGOGASTRODUODENOSCOPY (EGD);  Surgeon: Milus Banister, MD;  Location: [facility];  Service: Endoscopy;  Laterality: N/A;  . Esophagogastroduodenoscopy Left 01/11/2014    Procedure: ESOPHAGOGASTRODUODENOSCOPY (EGD);  Surgeon: Juanita Craver, MD;  [facility] ENDOSCOPY;  Service: Endoscopy;  Laterality: Left;     Home Meds: Prior to Admission medications   Medication Sig Start Date End Date Taking? Authorizing Provider  atorvastatin (LIPITOR) 20 MG tablet Take  20 mg by mouth every morning.    Yes Historical Provider, MD  colchicine 0.6 MG tablet Take 1 tablet (0.6 mg total) by mouth 2 (two) times daily. 01/14/14  Yes Thurnell Lose, MD  ferrous gluconate (FERGON) 325 MG tablet Take 325 mg by mouth 2 (two) times daily.    Yes Historical Provider, MD  insulin NPH Human (HUMULIN N,NOVOLIN N) 100 UNIT/ML injection Inject 10-15 Units into the skin 2 (two) times daily before a meal.    Yes Historical Provider, MD  insulin regular (NOVOLIN R,HUMULIN R) 100 units/mL injection Inject 20-25 Units into the skin 2 (two) times daily.   Yes Historical Provider, MD  losartan (COZAAR) 50 MG tablet Take 0.5 tablets (25 mg total) by mouth daily. 02/01/14  Yes Cherene Altes, MD  metFORMIN (GLUCOPHAGE) 1000 MG tablet Take 1,000 mg by mouth 2 (two) times daily with a meal.    Yes Historical Provider, MD  metolazone (ZAROXOLYN) 2.5 MG tablet Take 2.5 mg by mouth every Monday, Wednesday, and Friday.   Yes Historical Provider, MD  Multiple Vitamins-Minerals (CVS SPECTRAVITE ADULT 50+ PO) Take 1 tablet by mouth daily.   Yes Historical Provider, MD  omeprazole (PRILOSEC) 20 MG capsule Take 20 mg by mouth daily.   Yes Historical Provider, MD  potassium chloride (K-DUR,KLOR-CON) 10 MEQ tablet Take 60 mEq by mouth 2 (two) times daily.   Yes Historical Provider, MD  torsemide (DEMADEX) 20 MG tablet Take 120 mg by mouth 2 (two) times daily.   Yes Historical Provider, MD  verapamil (COVERA HS) 180 MG (CO) 24 hr tablet Take 360 mg by mouth every morning.   Yes Historical Provider, MD  vitamin C (ASCORBIC ACID) 500 MG tablet Take 500 mg by mouth 2 (two) times daily.   Yes Historical Provider, MD  amiodarone (PACERONE) 400 MG tablet Take 400 mg by mouth 2 (two) times daily. 02/17/14   Rande Brunt, NP    Allergies:  Allergies  Allergen Reactions  . Codeine Other (See Comments)    Hurting in chest  . Diltiazem Hcl Itching  . Niacin Other (See Comments)    Hot flashes     History   Social History  . Marital Status: Married    Spouse Name: N/A    Number of Children: 3  . Years of Education: N/A   Occupational History  .      [employer] at [employer] History Main Topics  . Smoking status: Former Smoker -- 1.00 packs/day for 30 years    Types: Cigarettes    Quit date: 07/25/2000  . Smokeless tobacco: Never Used     Comment: Quit smoking in 2002  . Alcohol Use: 0.6 oz/week    1 Shots of liquor per week     Comment: beer 1 week ago  . Drug Use: No  . Sexual Activity: Yes   Other Topics Concern  .  Not on file   Social History Narrative   Lives in [place], [place] with wife.      Family History  Problem Relation Age of Onset  . Coronary artery disease      FAMILY HISTORY  . Hypertension Father   . Diabetes Father   . Heart disease Father   . COPD Sister   . Cancer Maternal Aunt     Breast cancer   . Cancer Maternal Aunt     Breast cancer   . Diabetes Son   . Cancer Daughter     Cervical cancer     ROS:  Please see the history of present illness.     All other systems reviewed and negative.    Physical Exam: Blood pressure 109/67, pulse 85, height 5\' 8"  (1.727 m), weight 257 lb 12.8 oz (116.937 kg). General: Well developed, morbidly obese age appearing 61  male in no acute distress. Head: Normocephalic, atraumatic, sclera non-icteric, no xanthomas, nares are without discharge. EENT: normal Lymph Nodes:  none Back: without scoliosis/kyphosis , no CVA tendersness Neck: Negative for carotid bruits. JVD not elevated. Lungs: Clear bilaterally to auscultation without wheezes, rales, or rhonchi. Breathing is unlabored. Heart: Irregularly Irregular with S1 S2. 2 /6 systolic murmur , rubs, or gallops appreciated. Abdomen: Soft, non-tender, non-distended with normoactive bowel sounds. No hepatomegaly. No rebound/guarding. No obvious abdominal masses. Msk:  Strength and tone appear normal for age. Extremities: No  clubbing or cyanosis. No  edema.  Distal pedal pulses are 2+ and equal bilaterally. Skin: Warm and Dry Neuro: Alert and oriented X 3. CN III-XII intact Grossly normal sensory and motor function . Psych:  Responds to questions appropriately with a normal affect.      Labs: Cardiac Enzymes No results found for this basename: CKTOTAL, CKMB, TROPONINI,  in the last 72 hours CBC Lab Results  Component Value Date   WBC 4.1 01/31/2014   HGB 8.9* 01/31/2014   HCT 28.1* 01/31/2014   MCV 88.6 01/31/2014   PLT 132* 01/31/2014   PROTIME: No results found for this basename: LABPROT, INR,  in the last 72 hours Chemistry No results found for this basename: NA, K, CL, CO2, BUN, CREATININE, CALCIUM, LABALBU, PROT, BILITOT, ALKPHOS, ALT, AST, GLUCOSE,  in the last 168 hours Lipids Lab Results  Component Value Date   CHOL 100 03/01/2013   HDL 30.70* 03/01/2013   LDLCALC 38 03/01/2013   TRIG 157.0* 03/01/2013   BNP Pro B Natriuretic peptide (BNP)  Date/Time Value Ref Range Status  12/11/2013  9:21 AM 1152.0* 0 - 125 pg/mL Final  10/15/2013 11:48 AM 191.0* 0.0 - 100.0 pg/mL Final  07/30/2013  1:09 PM 1166.0* 0 - 125 pg/mL Final  05/24/2013 12:40 PM 142.0* 0.0 - 100.0 pg/mL Final   Miscellaneous Lab Results  Component Value Date   DDIMER 0.43 03/02/2012    Radiology/Studies:  No results found.  EKG: Afib  RBBB  RAD  85  HR with exertion >>140    Assessment and Plan:   Atrial fibrillation-permanent  HFpEF  Sleep-disordered breathing/sleep apnea  Coronary disease with prior bypass surgery and stenting  Aortic valve replacement-normal function  Morbid obesity  The patient has significant dyspnea on exertion. Exercise testing in the office demonstrated a heart rate to 140. Heart rate control with medications has failed here todate There is some possibility that bucindolol may augment  . Heart rate control and we will undertake this.  In the event that this fails, I agree that  he ablation and  pacing would be appropriate. I'm not sure that I would amiodarone at this juncture for the short period of time prior to the aforementioned decisions.  He is agreeable to have a repeat sleep study.    Virl Axe

## 2014-02-21 ENCOUNTER — Telehealth: Payer: Self-pay | Admitting: Cardiovascular Disease

## 2014-02-21 DIAGNOSIS — I482 Chronic atrial fibrillation, unspecified: Secondary | ICD-10-CM

## 2014-02-21 NOTE — Telephone Encounter (Signed)
New Prob    Wife and pt have some questions regarding a medication that was supposed to be sent in to pharmacy recently. Please call.

## 2014-02-21 NOTE — Telephone Encounter (Signed)
Spoke with patient's wife with patient in the background giving information who states Dr. Caryl Comes advised he was going to take patient off Metoprolol and put him on a different medication that is a "cousin" of Metoprolol.  Wife states they went to pharmacy to pick up medication and there was nothing sent in.  I advised patient that Dr. Caryl Comes is in the office today and that I will talk with him and call the patient back.  Wife verbalized understanding and agreement.

## 2014-02-21 NOTE — Telephone Encounter (Signed)
Spoke with Dr. Caryl Comes who is in the office today who states Bucindolol is not yet available for Rx so patient is to continue Metoprolol and his primary nurse, Trinidad Curet, RN will contact patient next week about setting up an ablation.  Patient's wife verbalized understanding and agreement.

## 2014-02-27 ENCOUNTER — Ambulatory Visit: Payer: Medicare Other | Admitting: Cardiology

## 2014-02-27 NOTE — Addendum Note (Signed)
Addended by: Stanton Kidney on: 02/27/2014 03:35 PM   Modules accepted: Orders

## 2014-02-27 NOTE — Telephone Encounter (Signed)
Informed patient that, per Dr. Caryl Comes, patient will need pacemaker placement a couple of weeks before ablation procedure can be performed. We also need an echo prior to pacemaker procedure.  Pt would like to have echo first, then schedule procedure. He states that he is taking care of his wife, and taking dtr back and forth to cancer treatments and has limited time. Explained that someone would contact him to schedule echo, and I will contact him upon return in office (out until 8/18) for scheduling PPM. He is agreeable to this.

## 2014-03-03 ENCOUNTER — Ambulatory Visit (HOSPITAL_COMMUNITY)
Admission: RE | Admit: 2014-03-03 | Discharge: 2014-03-03 | Disposition: A | Discharge status: Home / Self Care | Payer: Medicare Other | Source: Ambulatory Visit | Attending: Internal Medicine | Admitting: Internal Medicine

## 2014-03-03 VITALS — BP 104/62 | HR 80 | Wt 262.2 lb

## 2014-03-03 DIAGNOSIS — K7460 Unspecified cirrhosis of liver: Secondary | ICD-10-CM | POA: Insufficient documentation

## 2014-03-03 DIAGNOSIS — Z954 Presence of other heart-valve replacement: Secondary | ICD-10-CM | POA: Diagnosis not present

## 2014-03-03 DIAGNOSIS — I4821 Permanent atrial fibrillation: Secondary | ICD-10-CM

## 2014-03-03 DIAGNOSIS — Z8719 Personal history of other diseases of the digestive system: Secondary | ICD-10-CM

## 2014-03-03 DIAGNOSIS — I4891 Unspecified atrial fibrillation: Secondary | ICD-10-CM | POA: Insufficient documentation

## 2014-03-03 DIAGNOSIS — J449 Chronic obstructive pulmonary disease, unspecified: Secondary | ICD-10-CM | POA: Insufficient documentation

## 2014-03-03 DIAGNOSIS — G4733 Obstructive sleep apnea (adult) (pediatric): Secondary | ICD-10-CM | POA: Insufficient documentation

## 2014-03-03 DIAGNOSIS — I5032 Chronic diastolic (congestive) heart failure: Secondary | ICD-10-CM | POA: Diagnosis not present

## 2014-03-03 DIAGNOSIS — D500 Iron deficiency anemia secondary to blood loss (chronic): Secondary | ICD-10-CM | POA: Insufficient documentation

## 2014-03-03 DIAGNOSIS — N183 Chronic kidney disease, stage 3 unspecified: Secondary | ICD-10-CM | POA: Insufficient documentation

## 2014-03-03 DIAGNOSIS — K922 Gastrointestinal hemorrhage, unspecified: Secondary | ICD-10-CM | POA: Diagnosis not present

## 2014-03-03 DIAGNOSIS — K219 Gastro-esophageal reflux disease without esophagitis: Secondary | ICD-10-CM | POA: Diagnosis not present

## 2014-03-03 DIAGNOSIS — I129 Hypertensive chronic kidney disease with stage 1 through stage 4 chronic kidney disease, or unspecified chronic kidney disease: Secondary | ICD-10-CM | POA: Diagnosis not present

## 2014-03-03 DIAGNOSIS — M109 Gout, unspecified: Secondary | ICD-10-CM | POA: Diagnosis not present

## 2014-03-03 DIAGNOSIS — Z951 Presence of aortocoronary bypass graft: Secondary | ICD-10-CM | POA: Insufficient documentation

## 2014-03-03 DIAGNOSIS — I509 Heart failure, unspecified: Secondary | ICD-10-CM

## 2014-03-03 DIAGNOSIS — E785 Hyperlipidemia, unspecified: Secondary | ICD-10-CM | POA: Insufficient documentation

## 2014-03-03 DIAGNOSIS — J4489 Other specified chronic obstructive pulmonary disease: Secondary | ICD-10-CM | POA: Insufficient documentation

## 2014-03-03 LAB — BASIC METABOLIC PANEL
Anion gap: 18 — ABNORMAL HIGH (ref 5–15)
BUN: 43 mg/dL — ABNORMAL HIGH (ref 6–23)
CO2: 25 meq/L (ref 19–32)
Calcium: 9 mg/dL (ref 8.4–10.5)
Chloride: 94 mEq/L — ABNORMAL LOW (ref 96–112)
Creatinine, Ser: 1.48 mg/dL — ABNORMAL HIGH (ref 0.50–1.35)
GFR calc Af Amer: 56 mL/min — ABNORMAL LOW (ref >90)
GFR calc non Af Amer: 48 mL/min — ABNORMAL LOW (ref >90)
GLUCOSE: 119 mg/dL — AB (ref 70–99)
Potassium: 3.1 mEq/L — ABNORMAL LOW (ref 3.7–5.3)
SODIUM: 137 meq/L (ref 137–147)

## 2014-03-03 LAB — CBC
HEMATOCRIT: 27.6 % — AB (ref 39.0–52.0)
Hemoglobin: 8.3 g/dL — ABNORMAL LOW (ref 13.0–17.0)
MCH: 23.6 pg — ABNORMAL LOW (ref 26.0–34.0)
MCHC: 30.1 g/dL (ref 30.0–36.0)
MCV: 78.6 fL (ref 78.0–100.0)
Platelets: 184 10*3/uL (ref 150–400)
RBC: 3.51 MIL/uL — ABNORMAL LOW (ref 4.22–5.81)
RDW: 21.2 % — ABNORMAL HIGH (ref 11.5–15.5)
WBC: 6.1 10*3/uL (ref 4.0–10.5)

## 2014-03-03 NOTE — Progress Notes (Signed)
Patient ID: [name], male   DOB: 1946/04/28, 68 y.o.   MRN: [id number] Primary Cardiologist: Dr. Burt Knack GI: Dr Laural Golden- followed for cirrhosis Pulmonary: Dr Melvyn Novas  PCP: Elesa Hacker [facility] at [facility])  Dr. Bubba Camp (Endocrinologist)  Mr [name] is a 68 yo with history of CAD s/p CABG, permanent atrial fibrillation, chronic diastolic CHF, severe AS s/p bioprosthetic AVR presents for evaluation of diastolic CHF.  Last LHC in 8/13 showed patent grafts.  AVR was in 10/13.  Last echo in 8/14 showed EF 55-60%.    RHC (12/2013): RA 14 RV 44/4/11 PA 50/23 (33) PCWP 20, v = 35 Fick CO/CI: 6.0/2.6 PVR 2.2 WU PA 59%  July 2015:  Admitted to the hospital for GI bleed requiring 2 PRBCs. No additional GI work-up with recent EGD showing gastritis.  Follow up for Heart Failure: At last visit, referred to Dr. Caryl Comes in EP for possible AVN ablation with BiV pacing due to persistently elevated HRs. Saw Dr. Caryl Comes who recommended trial of bucindolol - but it was no longer available. So he is planning AVN ablation and pacing later this month based on results of echo. Weight up 4 pounds over past week. Eating a lot of watermelon and drinking lots of water. +SOB. Denies PND, orthopnea or CP. Took metolazone as scheduled this am.    Labs (8/14) LDL 38 Labs (3/15) K 3.4, creatinine 1.7, BUN 55  Labs (10/15/13) K 4.0 Creatinine 1.6 BUN 32 Labs (10/29/13) K 4.0 Creatinine 1.6  Labs (4/15) K 4.2 Creatinine 1.32 => 1.5, HCT 35.2 Labs (12/11/13) K 3.6 Creatinine 1.56 Pro BNP 1152 Hemoglobin 9.6  Labs (01/12/14) K 3.0, creatinine 2.0, hemoglobin 8.8 Labs (01/31/14) K 3.3, creatinine 1.49  PMH: 1. CAD: s/p CABG 2004. Last LHC (8/13) with patent free radial-OM, patent SVG-D, patent LIMA-LAD.  2. Permanent atrial fibrillation: Not anticoagulated due to GI bleeding.  Holter monitor (4/15) with mean HR 115 (afib).  3. Chronic diastolic CHF: Echo (0/93) with EF 55-60%, bioprosthetic aortic valve with mean gradient 22 mmHg,  mildly decreased RV systolic function. RHC (12/2013): RA 14, RV 44/4/11, PA 50/23 (33), PCWP 20, v = 35, Fick CO/CI: 6.0/2.6, PVR 2.2 WU, PA 59% 4. Severe aortic stenosis s/p bioprosthetic AVR in 10/13. Mean gradient 22 mmHg across valve on echo in 8/14.  5. NASH: ascites, thrombocytopenia.  Had paracentesis in 1/15. Gynecomastia with spironolactone.  6. 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  EGD in 1/15 showed mild portal gastropathy and 2 small antral ulcers. Recurrent GI bleed in 6/15, EGD showed gastritis and ?GAVE.  7. Hyperlipidemia 8. HTN 9. GERD 10. COPD 40. OSA: Cannot tolerate CPAP.  12. CKD 13. Gout  SH: Lives with wife in [place], prior smoker.   FH: CAD  ROS: All systems reviewed and negative except as per HPI.   Current Outpatient Prescriptions on File Prior to Encounter  Medication Sig Dispense Refill  . amiodarone (PACERONE) 400 MG tablet Take 400 mg by mouth 2 (two) times daily.      [name] atorvastatin (LIPITOR) 20 MG tablet Take 20 mg by mouth every morning.       . colchicine 0.6 MG tablet Take 1 tablet (0.6 mg total) by mouth 2 (two) times daily.  60 tablet  0  . ferrous gluconate (FERGON) 325 MG tablet Take 325 mg by mouth 2 (two) times daily.       . insulin NPH Human (HUMULIN N,NOVOLIN N) 100 UNIT/ML injection Inject 10-15 Units into the skin  2 (two) times daily before a meal.       . insulin regular (NOVOLIN R,HUMULIN R) 100 units/mL injection Inject 20-25 Units into the skin 2 (two) times daily.      [name] losartan (COZAAR) 50 MG tablet Take 0.5 tablets (25 mg total) by mouth daily.      . metFORMIN (GLUCOPHAGE) 1000 MG tablet Take 1,000 mg by mouth 2 (two) times daily with a meal.       . metolazone (ZAROXOLYN) 2.5 MG tablet Take 2.5 mg by mouth every Monday, Wednesday, and Friday.      . Multiple Vitamins-Minerals (CVS SPECTRAVITE ADULT 50+ PO) Take 1 tablet by mouth daily.      [name] omeprazole (PRILOSEC) 20 MG capsule Take 20 mg by mouth daily.      . potassium chloride  (K-DUR,KLOR-CON) 10 MEQ tablet Take 60 mEq by mouth 2 (two) times daily.      [name] torsemide (DEMADEX) 20 MG tablet Take 120 mg by mouth 2 (two) times daily.      . verapamil (COVERA HS) 180 MG (CO) 24 hr tablet Take 360 mg by mouth every morning.      . vitamin C (ASCORBIC ACID) 500 MG tablet Take 500 mg by mouth 2 (two) times daily.       No current facility-administered medications on file prior to encounter.    Filed Vitals:   03/03/14 1355  BP: 104/62  Pulse: 80  Weight: 262 lb 4 oz (118.956 kg)  SpO2: 96%    General: NAD Neck: JVP 7, no thyromegaly or thyroid nodule. Wife present  Lungs: Clear to auscultation bilaterally with normal respiratory effort. CV: Nondisplaced PMI.  Tachy, irregular S1/S2, no S3/S4, 2/6 early SEM RUSB.  No peripheral edema.  No carotid bruit.  Normal pedal pulses.  Abdomen: Soft, obese nontender, no hepatosplenomegaly, non distended.  Skin: Intact without lesions or rashes.  Neurologic: Alert and oriented x 3.  Psych: Normal affect. Extremities: No clubbing or cyanosis.   Assessment/Plan:  1. Chronic diastolic CHF:  EF 93-71% (02/2013).   - Weight is up a few pounds but I do not see to much fluid on board.  -  Chronic NYHA III symptoms and volume status stable. Will continue torsemide 120 mg BID and metolazone 2.5 mg on M,W,Friday.  - Will get labs today. - Counseled on need for fluid restriction - Repeat echo tomorrow per Dr. Caryl Comes - SBP stable. Will continue current medications.  2. Chronic atrial fibrillation:  CHADSVASC 3 - CHF, HTN, DM2. 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 We tried ASA 325 mg daily, however it was stopped again d/t GIB. Patient recently discharged again with GIB and blood transfusion. So not anticoagulation. Being evaluated by Dr. Caryl Comes for AVN ablation and BiV pacer.  3. CKD stage III: baseline Cr 1.5-2.0. Check BMET today 4. Bioprosthetic AVR: Probably mild prosthetic  valvular stenosis based on previous echo.  5. CAD: s/p CABG. No chest pain. Continue statin, BB and ARB. He has been off aspirin and anticoagulation with history of GI bleeding  6. Cirrhosis: NASH-related. Has followup in August Per Dr Laural Golden 7. Anemia: Just readmitted with GI bleed. No longer on ASA. Check CBC today        Benay Spice 1:57 PM

## 2014-03-03 NOTE — Patient Instructions (Signed)
Labs today  Your physician recommends that you schedule a follow-up appointment in: 6 weeks  

## 2014-03-04 ENCOUNTER — Ambulatory Visit (HOSPITAL_COMMUNITY): Payer: Medicare Other | Attending: Cardiology | Admitting: Cardiology

## 2014-03-04 ENCOUNTER — Other Ambulatory Visit (HOSPITAL_COMMUNITY): Payer: Medicare Other

## 2014-03-04 ENCOUNTER — Other Ambulatory Visit (HOSPITAL_COMMUNITY): Payer: Self-pay

## 2014-03-04 ENCOUNTER — Telehealth (HOSPITAL_COMMUNITY): Payer: Self-pay

## 2014-03-04 DIAGNOSIS — I059 Rheumatic mitral valve disease, unspecified: Secondary | ICD-10-CM | POA: Insufficient documentation

## 2014-03-04 DIAGNOSIS — I4891 Unspecified atrial fibrillation: Secondary | ICD-10-CM | POA: Diagnosis not present

## 2014-03-04 DIAGNOSIS — I482 Chronic atrial fibrillation, unspecified: Secondary | ICD-10-CM

## 2014-03-04 DIAGNOSIS — I079 Rheumatic tricuspid valve disease, unspecified: Secondary | ICD-10-CM | POA: Insufficient documentation

## 2014-03-04 DIAGNOSIS — I350 Nonrheumatic aortic (valve) stenosis: Secondary | ICD-10-CM

## 2014-03-04 MED ORDER — SPIRONOLACTONE 12.5 MG HALF TABLET: 25.0000 mg | ORAL_TABLET | Freq: Every day | ORAL | Status: DC

## 2014-03-04 NOTE — Progress Notes (Signed)
Echo performed. 

## 2014-03-04 NOTE — Telephone Encounter (Signed)
Lab results reviewed with patient, potassium low (3.1).  Per Dr. Haroldine Laws, patient to take extra 80 meq today, start spironolactone 12.5 mg once daily, and have bmet rechecked in 1 wk.  Aware and agreeable.

## 2014-03-04 NOTE — Addendum Note (Signed)
Encounter addended by: Vanessa Barbara, CCT on: 03/04/2014  9:07 AM<BR>     Documentation filed: Charges VN

## 2014-03-05 ENCOUNTER — Other Ambulatory Visit (HOSPITAL_COMMUNITY): Payer: Self-pay | Admitting: Cardiology

## 2014-03-05 DIAGNOSIS — I509 Heart failure, unspecified: Secondary | ICD-10-CM

## 2014-03-05 MED ORDER — SPIRONOLACTONE 25 MG PO TABS: 12.5000 mg | ORAL_TABLET | Freq: Every day | ORAL | Status: DC

## 2014-03-05 NOTE — Telephone Encounter (Signed)
Pharmacy sent over fax requesting clarification New rx sent

## 2014-03-08 NOTE — Addendum Note (Signed)
Encounter addended by: Jolaine Artist, MD on: 03/08/2014  9:12 PM<BR>     Documentation filed: Visit Diagnoses, Follow-up Section, LOS Section, Notes Section

## 2014-03-14 NOTE — Telephone Encounter (Signed)
Dr. Caryl Comes: Patient had echo on 8/11. Please review and advise if ok to schedule PPM insertion.

## 2014-03-17 ENCOUNTER — Ambulatory Visit (INDEPENDENT_AMBULATORY_CARE_PROVIDER_SITE_OTHER): Payer: Medicare Other | Admitting: Internal Medicine

## 2014-03-17 ENCOUNTER — Encounter (INDEPENDENT_AMBULATORY_CARE_PROVIDER_SITE_OTHER): Payer: Self-pay | Admitting: Internal Medicine

## 2014-03-17 VITALS — BP 112/52 | HR 76 | Temp 97.6°F | Ht 68.0 in | Wt 251.4 lb

## 2014-03-17 DIAGNOSIS — D509 Iron deficiency anemia, unspecified: Secondary | ICD-10-CM | POA: Diagnosis not present

## 2014-03-17 DIAGNOSIS — K7030 Alcoholic cirrhosis of liver without ascites: Secondary | ICD-10-CM | POA: Diagnosis not present

## 2014-03-17 DIAGNOSIS — I25810 Atherosclerosis of coronary artery bypass graft(s) without angina pectoris: Secondary | ICD-10-CM

## 2014-03-17 DIAGNOSIS — Z87898 Personal history of other specified conditions: Secondary | ICD-10-CM

## 2014-03-17 MED ORDER — OMEPRAZOLE 20 MG PO CPDR: 20.0000 mg | DELAYED_RELEASE_CAPSULE | Freq: Every day | ORAL | Status: DC

## 2014-03-17 NOTE — Progress Notes (Signed)
Presenting complaint;  Followup for iron deficiency anemia, cirrhosis and ascites.  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 He had lab studies revealing hemoglobin of 11.6, serum iron of 50, TIBC of 527 and saturation was 10%. He states he began passing tarry stools a few days after he was begun on full dose aspirin. He noted progressive weakness and shortness of breath and found to have hemoglobin of 7.3 g and therefore hospitalized on 01/10/2014 and discharged 4 days later. He received 2 units of PRBCs. EGD by Dr. Collene Mares revealed small sliding heart hernia on gastric polyp gastritis and? Focal GAVE without evidence of bleeding. Patient was readmitted on 01/28/2014 and discharged 4 days later. He received 2 more units of PRBCs. Aspirin was discontinued. He has not experienced any more episodes of tarry stool or rectal bleeding but he states his hemoglobin is not coming up. He is not having exertional dyspnea like he was having before but he cannot walk long distance because of fatigue. He has lost 16 pounds since his last office visit 4 months ago. His wife states that he has cut back on food intake. He tells me that he will have pacemaker placed in near future. Date has not been set yet. He states heartburn is well controlled with therapy. He is only taking single omeprazole pill. He denies abdominal pain or distention.    Current Medications: Outpatient Encounter Prescriptions as of 03/17/2014  Medication Sig  . amiodarone (PACERONE) 400 MG tablet Take 200 mg by mouth 2 (two) times daily.   [name] atorvastatin (LIPITOR) 20 MG tablet Take 20 mg by mouth every morning.   . colchicine 0.6 MG tablet Take 1 tablet (0.6 mg total) by mouth 2 (two) times daily.  . ferrous gluconate (FERGON) 325 MG tablet Take 325 mg by mouth 2 (two) times daily.   [name]  gabapentin (NEURONTIN) 100 MG capsule Take 100 mg by mouth 3 (three) times daily.  . insulin NPH Human (HUMULIN N,NOVOLIN N) 100 UNIT/ML injection Inject 20-25 Units into the skin 2 (two) times daily before a meal.   . insulin regular (NOVOLIN R,HUMULIN R) 100 units/mL injection Inject 10-20 Units into the skin 3 (three) times daily before meals.   [name] losartan (COZAAR) 50 MG tablet Take 0.5 tablets (25 mg total) by mouth daily.  . metolazone (ZAROXOLYN) 2.5 MG tablet Take 2.5 mg by mouth every Monday, Wednesday, and Friday.  . metoprolol succinate (TOPROL-XL) 100 MG 24 hr tablet Take 100 mg by mouth 2 (two) times daily. Take with or immediately following a meal.  . Multiple Vitamins-Minerals (CVS SPECTRAVITE ADULT 50+ PO) Take 1 tablet by mouth daily.  [name] omeprazole (PRILOSEC) 20 MG capsule Take 40 mg by mouth 2 (two) times daily before a meal.   . potassium chloride (K-DUR,KLOR-CON) 10 MEQ tablet Take 60 mEq by mouth 2 (two) times daily.  [name] spironolactone (ALDACTONE) 25 MG tablet Take 0.5 tablets (12.5 mg total) by mouth daily.  [name] torsemide (DEMADEX) 20 MG tablet 120 mg in AM and 100 mg in PM  . verapamil (COVERA HS) 240 MG (CO) 24 hr tablet Take 360 mg by mouth at bedtime.  . vitamin C (ASCORBIC ACID) 500 MG tablet Take 500 mg by mouth 2 (two) times daily.     Objective: Blood pressure 112/52, pulse 76, temperature 97.6 F (36.4 C), height  5\' 8"  (1.727 m), weight 251 lb 6.4 oz (114.034 kg). Patient is alert and in no acute distress. He appears pale. Conjunctiva is pale. Sclera is nonicteric Oropharyngeal mucosa is normal. No neck masses or thyromegaly noted. Cardiac exam with regular rhythm normal S1 and S2. No murmur or gallop noted. Lungs are clear to auscultation. Abdomen is obese; it is soft and nontender on palpation. There is subcutaneous lump below the right costal margin consistent with lipoma. Liver or spleen not palpable. Shifting dullness is absent. No LE edema or clubbing  noted.  Labs/studies Results: Lab data from 03/03/2014. WBC 6.1, H&H 8.3 and 27.6 and platelet count 184K.   serum sodium 137, potassium 3.1, chloride 94, CO2 25, BUN 43, creatinine 1.48 and glucose 119.  Assessment:  #1. Iron deficiency anemia. Has history of small bowel angiodysplasia and underwent multiple EGDs in 2012 along with colonoscopy and small bowel endoscopy at [facility]. 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 He has not responded to oral iron therapy. Suspect he is losing blood from small bowel. Last colonoscopy was in September 2012. If stool is still guaiac positive will consider colonoscopy before considering referral for small bowel endoscopy. #2. 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 Last tap was in January this year with removal of only 1500 mL of fluid. Exam today is negative for ascites.   Plan:  Hemoccult x1. Patient will go to lab for CBC and metabolic 7 next week. If H&H is low will consider parenteral iron. If Hemoccult is positive will proceed with diagnostic colonoscopy after pacemar has been placed. Office visit in 6 months.

## 2014-03-17 NOTE — Progress Notes (Deleted)
Subjective:     Patient ID: [name], male   DOB: 05/22/1946, 68 y.o.   MRN: [id number]  HPI   Review of Systems     Objective:   Physical Exam     Assessment:     ***    Plan:     ***

## 2014-03-17 NOTE — Patient Instructions (Signed)
Hemoccult x 1 CBC and metabolic 7 next week. Notify if you have rectal bleeding or tarry stools.

## 2014-03-18 ENCOUNTER — Telehealth (INDEPENDENT_AMBULATORY_CARE_PROVIDER_SITE_OTHER): Payer: Self-pay | Admitting: *Deleted

## 2014-03-18 NOTE — Telephone Encounter (Signed)
   Diagnosis:    Result(s)   Card 1: Positive:           Completed by: Drevion Offord,LPN   HEMOCCULT SENSA DEVELOPER: LOT#:  [id number] EXPIRATION DATE: 2016-05   HEMOCCULT SENSA CARD:  LOT#:  [id number] EXPIRATION DATE: 09/15   CARD CONTROL RESULTS:  POSITIVE:  NEGATIVE:     ADDITIONAL COMMENTS:

## 2014-03-18 NOTE — Telephone Encounter (Signed)
Hemoccult is positive; waiting for labs to be done next week

## 2014-03-21 ENCOUNTER — Encounter: Payer: Self-pay | Admitting: *Deleted

## 2014-03-21 ENCOUNTER — Encounter (HOSPITAL_COMMUNITY): Payer: Self-pay | Admitting: Pharmacy Technician

## 2014-03-21 NOTE — Telephone Encounter (Signed)
Scheduled PPM for 8/31, per pt request. Pre procedure labs to be done at hospital that morning. NPO after MN, no insulin/diurectics morning of procedure. Wound check scheduled for 04/07/14. Reviewed procedure instructions with patient and his wife. They both verbalized understanding of instructions and wound check follow up.

## 2014-03-23 DIAGNOSIS — K219 Gastro-esophageal reflux disease without esophagitis: Secondary | ICD-10-CM | POA: Diagnosis not present

## 2014-03-23 DIAGNOSIS — I10 Essential (primary) hypertension: Secondary | ICD-10-CM | POA: Diagnosis not present

## 2014-03-23 DIAGNOSIS — J4489 Other specified chronic obstructive pulmonary disease: Secondary | ICD-10-CM | POA: Diagnosis not present

## 2014-03-23 DIAGNOSIS — Z6838 Body mass index (BMI) 38.0-38.9, adult: Secondary | ICD-10-CM | POA: Diagnosis not present

## 2014-03-23 DIAGNOSIS — I509 Heart failure, unspecified: Secondary | ICD-10-CM | POA: Diagnosis not present

## 2014-03-23 DIAGNOSIS — I5032 Chronic diastolic (congestive) heart failure: Secondary | ICD-10-CM | POA: Diagnosis not present

## 2014-03-23 DIAGNOSIS — I2510 Atherosclerotic heart disease of native coronary artery without angina pectoris: Secondary | ICD-10-CM | POA: Diagnosis not present

## 2014-03-23 DIAGNOSIS — G4733 Obstructive sleep apnea (adult) (pediatric): Secondary | ICD-10-CM | POA: Diagnosis not present

## 2014-03-23 DIAGNOSIS — J449 Chronic obstructive pulmonary disease, unspecified: Secondary | ICD-10-CM | POA: Diagnosis not present

## 2014-03-23 DIAGNOSIS — E785 Hyperlipidemia, unspecified: Secondary | ICD-10-CM | POA: Diagnosis not present

## 2014-03-23 DIAGNOSIS — E119 Type 2 diabetes mellitus without complications: Secondary | ICD-10-CM | POA: Diagnosis not present

## 2014-03-23 DIAGNOSIS — Z952 Presence of prosthetic heart valve: Secondary | ICD-10-CM | POA: Diagnosis not present

## 2014-03-23 DIAGNOSIS — D509 Iron deficiency anemia, unspecified: Secondary | ICD-10-CM | POA: Diagnosis not present

## 2014-03-23 DIAGNOSIS — I4891 Unspecified atrial fibrillation: Secondary | ICD-10-CM | POA: Diagnosis not present

## 2014-03-23 DIAGNOSIS — Z951 Presence of aortocoronary bypass graft: Secondary | ICD-10-CM | POA: Diagnosis not present

## 2014-03-23 DIAGNOSIS — I442 Atrioventricular block, complete: Secondary | ICD-10-CM | POA: Diagnosis not present

## 2014-03-23 MED ORDER — CEFAZOLIN SODIUM-DEXTROSE 2-3 GM-% IV SOLR: 2.0000 g | INTRAVENOUS | Status: DC

## 2014-03-23 MED ORDER — SODIUM CHLORIDE 0.9 % IR SOLN
80.0000 mg | Status: DC
  Filled 2014-03-23: qty 2

## 2014-03-24 ENCOUNTER — Encounter (HOSPITAL_COMMUNITY): Payer: Self-pay

## 2014-03-24 ENCOUNTER — Ambulatory Visit (HOSPITAL_COMMUNITY)
Admission: RE | Admit: 2014-03-24 | Discharge: 2014-03-25 | Disposition: A | Discharge status: Home / Self Care | Payer: Medicare Other | Source: Ambulatory Visit | Attending: Internal Medicine | Admitting: Internal Medicine

## 2014-03-24 ENCOUNTER — Encounter (HOSPITAL_COMMUNITY)
Admission: RE | Disposition: A | Discharge status: Home / Self Care | Payer: Self-pay | Source: Ambulatory Visit | Attending: Internal Medicine

## 2014-03-24 DIAGNOSIS — I5032 Chronic diastolic (congestive) heart failure: Secondary | ICD-10-CM | POA: Insufficient documentation

## 2014-03-24 DIAGNOSIS — J4489 Other specified chronic obstructive pulmonary disease: Secondary | ICD-10-CM | POA: Insufficient documentation

## 2014-03-24 DIAGNOSIS — E119 Type 2 diabetes mellitus without complications: Secondary | ICD-10-CM | POA: Insufficient documentation

## 2014-03-24 DIAGNOSIS — G4733 Obstructive sleep apnea (adult) (pediatric): Secondary | ICD-10-CM | POA: Insufficient documentation

## 2014-03-24 DIAGNOSIS — E785 Hyperlipidemia, unspecified: Secondary | ICD-10-CM | POA: Insufficient documentation

## 2014-03-24 DIAGNOSIS — I2510 Atherosclerotic heart disease of native coronary artery without angina pectoris: Secondary | ICD-10-CM | POA: Insufficient documentation

## 2014-03-24 DIAGNOSIS — J449 Chronic obstructive pulmonary disease, unspecified: Secondary | ICD-10-CM | POA: Insufficient documentation

## 2014-03-24 DIAGNOSIS — I4891 Unspecified atrial fibrillation: Secondary | ICD-10-CM | POA: Insufficient documentation

## 2014-03-24 DIAGNOSIS — I495 Sick sinus syndrome: Secondary | ICD-10-CM

## 2014-03-24 DIAGNOSIS — Z952 Presence of prosthetic heart valve: Secondary | ICD-10-CM | POA: Insufficient documentation

## 2014-03-24 DIAGNOSIS — I442 Atrioventricular block, complete: Secondary | ICD-10-CM | POA: Insufficient documentation

## 2014-03-24 DIAGNOSIS — D509 Iron deficiency anemia, unspecified: Secondary | ICD-10-CM | POA: Insufficient documentation

## 2014-03-24 DIAGNOSIS — K219 Gastro-esophageal reflux disease without esophagitis: Secondary | ICD-10-CM | POA: Insufficient documentation

## 2014-03-24 DIAGNOSIS — Z6838 Body mass index (BMI) 38.0-38.9, adult: Secondary | ICD-10-CM | POA: Insufficient documentation

## 2014-03-24 DIAGNOSIS — Z951 Presence of aortocoronary bypass graft: Secondary | ICD-10-CM | POA: Insufficient documentation

## 2014-03-24 DIAGNOSIS — I10 Essential (primary) hypertension: Secondary | ICD-10-CM | POA: Insufficient documentation

## 2014-03-24 DIAGNOSIS — I509 Heart failure, unspecified: Secondary | ICD-10-CM | POA: Insufficient documentation

## 2014-03-24 HISTORY — PX: PERMANENT PACEMAKER INSERTION: SHX5480

## 2014-03-24 HISTORY — PX: PACEMAKER INSERTION: SHX728

## 2014-03-24 LAB — CBC
HCT: 30.7 % — ABNORMAL LOW (ref 39.0–52.0)
Hemoglobin: 9.1 g/dL — ABNORMAL LOW (ref 13.0–17.0)
MCH: 22.7 pg — ABNORMAL LOW (ref 26.0–34.0)
MCHC: 29.6 g/dL — ABNORMAL LOW (ref 30.0–36.0)
MCV: 76.6 fL — ABNORMAL LOW (ref 78.0–100.0)
PLATELETS: 150 10*3/uL (ref 150–400)
RBC: 4.01 MIL/uL — AB (ref 4.22–5.81)
RDW: 21.1 % — ABNORMAL HIGH (ref 11.5–15.5)
WBC: 5.2 10*3/uL (ref 4.0–10.5)

## 2014-03-24 LAB — BASIC METABOLIC PANEL
ANION GAP: 17 — AB (ref 5–15)
BUN: 55 mg/dL — ABNORMAL HIGH (ref 6–23)
CHLORIDE: 93 meq/L — AB (ref 96–112)
CO2: 28 mEq/L (ref 19–32)
CREATININE: 1.87 mg/dL — AB (ref 0.50–1.35)
Calcium: 9.7 mg/dL (ref 8.4–10.5)
GFR calc Af Amer: 42 mL/min — ABNORMAL LOW (ref >90)
GFR calc non Af Amer: 36 mL/min — ABNORMAL LOW (ref >90)
Glucose, Bld: 176 mg/dL — ABNORMAL HIGH (ref 70–99)
POTASSIUM: 3.9 meq/L (ref 3.7–5.3)
Sodium: 138 mEq/L (ref 137–147)

## 2014-03-24 LAB — GLUCOSE, CAPILLARY
GLUCOSE-CAPILLARY: 181 mg/dL — AB (ref 70–99)
Glucose-Capillary: 232 mg/dL — ABNORMAL HIGH (ref 70–99)
Glucose-Capillary: 228 mg/dL — ABNORMAL HIGH (ref 70–99)

## 2014-03-24 LAB — SURGICAL PCR SCREEN
MRSA, PCR: NEGATIVE
Staphylococcus aureus: NEGATIVE

## 2014-03-24 SURGERY — PERMANENT PACEMAKER INSERTION
Anesthesia: LOCAL

## 2014-03-24 MED ORDER — ATORVASTATIN CALCIUM 20 MG PO TABS
20.0000 mg | ORAL_TABLET | Freq: Every day | ORAL | Status: DC
  Filled 2014-03-24: qty 1

## 2014-03-24 MED ORDER — POTASSIUM CHLORIDE CRYS ER 20 MEQ PO TBCR
60.0000 meq | EXTENDED_RELEASE_TABLET | Freq: Two times a day (BID) | ORAL | Status: DC
  Administered 2014-03-24 – 2014-03-25 (×2): 60 meq via ORAL
  Filled 2014-03-24 (×3): qty 3

## 2014-03-24 MED ORDER — METOLAZONE 2.5 MG PO TABS: 2.5000 mg | ORAL_TABLET | ORAL | Status: DC

## 2014-03-24 MED ORDER — GABAPENTIN 100 MG PO CAPS
100.0000 mg | ORAL_CAPSULE | Freq: Three times a day (TID) | ORAL | Status: DC
  Administered 2014-03-24 – 2014-03-25 (×3): 100 mg via ORAL
  Filled 2014-03-24 (×5): qty 1

## 2014-03-24 MED ORDER — CEFAZOLIN SODIUM 1-5 GM-% IV SOLN
1.0000 g | Freq: Four times a day (QID) | INTRAVENOUS | Status: AC
  Administered 2014-03-24 – 2014-03-25 (×3): 1 g via INTRAVENOUS
  Filled 2014-03-24 (×3): qty 50

## 2014-03-24 MED ORDER — FERROUS GLUCONATE 324 (38 FE) MG PO TABS
325.0000 mg | ORAL_TABLET | Freq: Two times a day (BID) | ORAL | Status: DC
  Filled 2014-03-24 (×2): qty 1

## 2014-03-24 MED ORDER — SODIUM CHLORIDE 0.9 % IV SOLN: INTRAVENOUS | Status: DC

## 2014-03-24 MED ORDER — FENTANYL CITRATE 0.05 MG/ML IJ SOLN
INTRAMUSCULAR | Status: AC
  Filled 2014-03-24: qty 2

## 2014-03-24 MED ORDER — METOPROLOL SUCCINATE ER 100 MG PO TB24
100.0000 mg | ORAL_TABLET | Freq: Two times a day (BID) | ORAL | Status: DC
  Administered 2014-03-24 – 2014-03-25 (×2): 100 mg via ORAL
  Filled 2014-03-24 (×3): qty 1

## 2014-03-24 MED ORDER — PANTOPRAZOLE SODIUM 40 MG PO TBEC
40.0000 mg | DELAYED_RELEASE_TABLET | Freq: Every day | ORAL | Status: DC
  Administered 2014-03-25: 40 mg via ORAL
  Filled 2014-03-24: qty 1

## 2014-03-24 MED ORDER — INSULIN NPH (HUMAN) (ISOPHANE) 100 UNIT/ML ~~LOC~~ SUSP
25.0000 [IU] | Freq: Two times a day (BID) | SUBCUTANEOUS | Status: DC
  Administered 2014-03-24 – 2014-03-25 (×2): 25 [IU] via SUBCUTANEOUS
  Filled 2014-03-24: qty 10

## 2014-03-24 MED ORDER — SPIRONOLACTONE 12.5 MG HALF TABLET
12.5000 mg | ORAL_TABLET | Freq: Every day | ORAL | Status: DC
  Administered 2014-03-25: 12.5 mg via ORAL
  Filled 2014-03-24: qty 1

## 2014-03-24 MED ORDER — TORSEMIDE 100 MG PO TABS
100.0000 mg | ORAL_TABLET | Freq: Two times a day (BID) | ORAL | Status: DC
  Administered 2014-03-24 – 2014-03-25 (×2): 100 mg via ORAL
  Filled 2014-03-24 (×4): qty 1

## 2014-03-24 MED ORDER — ACETAMINOPHEN 325 MG PO TABS: 325.0000 mg | ORAL_TABLET | ORAL | Status: DC | PRN

## 2014-03-24 MED ORDER — INSULIN ASPART 100 UNIT/ML ~~LOC~~ SOLN
5.0000 [IU] | Freq: Three times a day (TID) | SUBCUTANEOUS | Status: DC
  Administered 2014-03-24 – 2014-03-25 (×2): 5 [IU] via SUBCUTANEOUS

## 2014-03-24 MED ORDER — COLCHICINE 0.6 MG PO TABS
0.6000 mg | ORAL_TABLET | Freq: Two times a day (BID) | ORAL | Status: DC
  Administered 2014-03-24 – 2014-03-25 (×2): 0.6 mg via ORAL
  Filled 2014-03-24 (×3): qty 1

## 2014-03-24 MED ORDER — MUPIROCIN 2 % EX OINT
TOPICAL_OINTMENT | CUTANEOUS | Status: AC
  Administered 2014-03-24: 1 via NASAL
  Filled 2014-03-24: qty 22

## 2014-03-24 MED ORDER — VERAPAMIL HCL 240 MG (CO) PO TB24: 360.0000 mg | ORAL_TABLET | Freq: Every day | ORAL | Status: DC

## 2014-03-24 MED ORDER — FERROUS GLUCONATE 324 (38 FE) MG PO TABS
324.0000 mg | ORAL_TABLET | Freq: Two times a day (BID) | ORAL | Status: DC
  Administered 2014-03-24 – 2014-03-25 (×2): 324 mg via ORAL
  Filled 2014-03-24 (×4): qty 1

## 2014-03-24 MED ORDER — CEFAZOLIN SODIUM-DEXTROSE 2-3 GM-% IV SOLR
INTRAVENOUS | Status: AC
  Administered 2014-03-24: 10:00:00
  Filled 2014-03-24: qty 50

## 2014-03-24 MED ORDER — CHLORHEXIDINE GLUCONATE 4 % EX LIQD
60.0000 mL | Freq: Once | CUTANEOUS | Status: DC
  Administered 2014-03-24: 4 via TOPICAL
  Filled 2014-03-24: qty 60

## 2014-03-24 MED ORDER — HEPARIN (PORCINE) IN NACL 2-0.9 UNIT/ML-% IJ SOLN
INTRAMUSCULAR | Status: AC
  Filled 2014-03-24: qty 500

## 2014-03-24 MED ORDER — LOSARTAN POTASSIUM 50 MG PO TABS
50.0000 mg | ORAL_TABLET | Freq: Every day | ORAL | Status: DC
  Administered 2014-03-25: 50 mg via ORAL
  Filled 2014-03-24: qty 1

## 2014-03-24 MED ORDER — VERAPAMIL HCL ER 180 MG PO TBCR
360.0000 mg | EXTENDED_RELEASE_TABLET | Freq: Every day | ORAL | Status: DC
Start: 2014-03-24 — End: 2014-03-25
  Filled 2014-03-24 (×2): qty 2

## 2014-03-24 MED ORDER — SODIUM CHLORIDE 0.9 % IV SOLN: INTRAVENOUS | Status: AC

## 2014-03-24 MED ORDER — MIDAZOLAM HCL 5 MG/5ML IJ SOLN
INTRAMUSCULAR | Status: AC
Start: 2014-03-24 — End: 2014-03-24
  Filled 2014-03-24: qty 5

## 2014-03-24 MED ORDER — LIDOCAINE HCL (PF) 1 % IJ SOLN
INTRAMUSCULAR | Status: AC
  Filled 2014-03-24: qty 60

## 2014-03-24 MED ORDER — SODIUM CHLORIDE 0.9 % IV SOLN
INTRAVENOUS | Status: DC
  Administered 2014-03-24: 10:00:00 via INTRAVENOUS

## 2014-03-24 MED ORDER — VITAMIN C 500 MG PO TABS
500.0000 mg | ORAL_TABLET | Freq: Two times a day (BID) | ORAL | Status: DC
  Administered 2014-03-24 – 2014-03-25 (×2): 500 mg via ORAL
  Filled 2014-03-24 (×3): qty 1

## 2014-03-24 MED ORDER — ONDANSETRON HCL 4 MG/2ML IJ SOLN: 4.0000 mg | Freq: Four times a day (QID) | INTRAMUSCULAR | Status: DC | PRN

## 2014-03-24 MED ORDER — MUPIROCIN 2 % EX OINT
TOPICAL_OINTMENT | Freq: Two times a day (BID) | CUTANEOUS | Status: DC
  Administered 2014-03-24: 22:00:00 via NASAL
  Administered 2014-03-24: 1 via NASAL

## 2014-03-24 NOTE — Progress Notes (Signed)
Orthopedic Tech Progress Note Patient Details:  [name] [birth year] [id number]  Ortho Devices Type of Ortho Device: Arm sling Ortho Device/Splint Interventions: Application   Cammer, Theodoro Parma 03/24/2014, 1:22 PM

## 2014-03-24 NOTE — Discharge Summary (Signed)
ELECTROPHYSIOLOGY PROCEDURE DISCHARGE SUMMARY    Patient ID: [name],  MRN: [id number], DOB/AGE: September 26, 1945 68 y.o.  Admit date: 03/24/2014 Discharge date: 03/25/2014  Primary Care Physician: [name], MD Primary Cardiologist: [place] Electrophysiologist: Caryl Comes  Primary Discharge Diagnosis:  Uncontrolled atrial fibrillation with anticipated AVN ablation status post single chamber pacemaker implantation this admission  Secondary Discharge Diagnosis:  1.  Permanent atrial fibrillation 2.  CAD s/p CABG 3.  Severe AS s/p AVR 4.  Hx of GI bleeding 5.  COPD 6.  Obesity 7.  Chronic anemia 8.  Diastolic HF  Allergies  Allergen Reactions  . Codeine Other (See Comments)    Hurting in chest  . Diltiazem Hcl Itching  . Niacin Other (See Comments)    Hot flashes     Procedures This Admission:  1.  Implantation of a single chamber pacemaker on 03/24/14 by Dr Caryl Comes.  The patient received a STJ Assurity single chamber pacemaker with model number 1948 right ventricular lead.  There were no early apparent complications.  2.  CXR on 03-25-14 demonstrated no ptx, stable leads  Brief HPI: [name] is a 69 y.o. male with a past medical history as outlined above.  He is not felt to be a candidate for rhythm control due to GI bleeding and inability to be anticoagulated.  There has been difficulty controlling ventricular rates while in atrial fibrillation.  Pacemaker implantation with subsequent AV nodal ablation was felt to be the best course of action.  Risks, benefits, and alternatives to pacemaker implantation were reviewed with the patient who wished to proceed.   Hospital Course:  The patient was admitted and underwent implantation of a STJ single chamber pacemaker with details as outlined above.   He was monitored on telemetry overnight which demonstrated atrial fibrillation with intermittent ventricular pacing.  Left chest was without hematoma or ecchymosis.  The  device was interrogated and found to be functioning normally.  CXR was obtained and demonstrated no pneumothorax status post device implantation.  Wound care, arm mobility, and restrictions were reviewed with the patient.  Dr Rayann Heman examined the patient and considered them stable for discharge to home.    Discharge Vitals: Blood pressure 122/62, pulse 68, temperature 98 F (36.7 C), temperature source Oral, resp. rate 18, height 5\' 8"  (1.727 m), weight 250 lb (113.4 kg), SpO2 99.00%.  Physical Exam: Filed Vitals:   03/24/14 1436 03/24/14 2100 03/25/14 0432 03/25/14 0500  BP: 112/70 115/59 122/62   Pulse: 71 62 68   Temp:  98.3 F (36.8 C) 98 F (36.7 C)   TempSrc:  Oral Oral   Resp:  18 18   Height:      Weight:    250 lb (113.4 kg)  SpO2: 98% 99%      GEN- The patient is well appearing, alert and oriented x 3 today.   Head- normocephalic, atraumatic Eyes-  Sclera clear, conjunctiva pink Ears- hearing intact Oropharynx- clear Neck- supple, Lungs- Clear to ausculation bilaterally, normal work of breathing Heart- irregular rate and rhythm  GI- soft, NT, ND, + BS Extremities- no clubbing, cyanosis, or edema  MS- no significant deformity or atrophy Skin- pacemaker site is without hematoma   Labs:   Lab Results  Component Value Date   WBC 5.2 03/24/2014   HGB 9.1* 03/24/2014   HCT 30.7* 03/24/2014   MCV 76.6* 03/24/2014   PLT 150 03/24/2014     Recent Labs Lab 03/24/14 1015  NA 138  K 3.9  CL 93*  CO2 28  BUN 55*  CREATININE 1.87*  CALCIUM 9.7  GLUCOSE 176*     Discharge Medications:    Medication List    ASK your doctor about these medications       amiodarone 400 MG tablet  Commonly known as:  PACERONE  Take 200 mg by mouth 2 (two) times daily.     atorvastatin 20 MG tablet  Commonly known as:  LIPITOR  Take 20 mg by mouth every morning.     colchicine 0.6 MG tablet  Take 1 tablet (0.6 mg total) by mouth 2 (two) times daily.     CVS SPECTRAVITE  ADULT 50+ PO  Take 1 tablet by mouth daily.     ferrous gluconate 325 MG tablet  Commonly known as:  FERGON  Take 325 mg by mouth 2 (two) times daily.     gabapentin 100 MG capsule  Commonly known as:  NEURONTIN  Take 100 mg by mouth 3 (three) times daily.     insulin NPH Human 100 UNIT/ML injection  Commonly known as:  HUMULIN N,NOVOLIN N  Inject 25 Units into the skin 2 (two) times daily before a meal.     insulin regular 100 units/mL injection  Commonly known as:  NOVOLIN R,HUMULIN R  Inject 5-12 Units into the skin 3 (three) times daily before meals. Per sliding scale     losartan 50 MG tablet  Commonly known as:  COZAAR  Take 50 mg by mouth daily.     metolazone 2.5 MG tablet  Commonly known as:  ZAROXOLYN  Take 2.5 mg by mouth 2 (two) times a week. Monday and Friday.     metoprolol succinate 100 MG 24 hr tablet  Commonly known as:  TOPROL-XL  Take 100 mg by mouth 2 (two) times daily. Take with or immediately following a meal.     omeprazole 20 MG capsule  Commonly known as:  PRILOSEC  Take 1 capsule (20 mg total) by mouth daily.     potassium chloride 10 MEQ tablet  Commonly known as:  K-DUR,KLOR-CON  Take 60 mEq by mouth 2 (two) times daily.     spironolactone 25 MG tablet  Commonly known as:  ALDACTONE  Take 0.5 tablets (12.5 mg total) by mouth daily.     torsemide 20 MG tablet  Commonly known as:  DEMADEX  Take 100 mg by mouth 2 (two) times daily.     verapamil 240 MG (CO) 24 hr tablet  Commonly known as:  COVERA HS  Take 360 mg by mouth daily with supper.     vitamin C 500 MG tablet  Commonly known as:  ASCORBIC ACID  Take 500 mg by mouth 2 (two) times daily.        Disposition:     Duration of Discharge Encounter: Greater than 30 minutes including physician time.  Signed,   Thompson Grayer MD

## 2014-03-24 NOTE — H&P (Signed)
Patient Care Team: [name], MD as PCP - General (Family Medicine) Rogene Houston, MD (Gastroenterology) Nobie Putnam, MD (Hematology and Oncology) Rounding Lbcardiology, MD as Consulting Physician (Cardiology) Sherren Mocha, MD as Consulting Physician (Cardiology)   HPI  [name] is a 68 y.o. male  Admitted for pacemaker for anticipated AV ablttion for uncontolled atrial fibrillation and assos HFpEF He is a history of ischemic heart disease with prior bypass surgery. He has a history of severe aortic stenosis with a bioprosthetic aVR. This was accomplished 10/13 with recent echo 8/14 demonstrated normal left ventricular function and surprisingly normal left atrial dimensions  He has a history of recurrent GI bleeding which is precluded the use of oral anticoagulation and this does not making a candidate for cardioversion. He has significant exercise impairment of exercise tolerance  Also COPD and untreated sleep apnea in the setting of morbid obesity. Also with anemai   Past Medical History  Diagnosis Date  . Overweight(278.02)   . CAD (coronary artery disease)     a. s/p CABG 2004;  b. [facility] 10/13:  LHC 8/13: Free radial to obtuse marginal patent, SVG-diagonal patent, LIMA-LAD patent, EF 65-70%, mean aortic valve gradient 42  . Atrial fibrillation     Permanent; off of Coumadin for now due to GI bleed  . DM2 (diabetes mellitus, type 2)     at least 10 yrs  . Insomnia   . Carotid bruit 06/14/2011    a. pre-AVR dopplers 10/13: no sig ICA stenosis  . Hypertension     x 15 yrs  . Chronic diastolic heart failure   . GERD (gastroesophageal reflux disease)   . COPD (chronic obstructive pulmonary disease)   . Iron deficiency anemia     Requiring intravenous iron  . H/O hiatal hernia   . AVM (arteriovenous malformation)     Recurrent GI bleeding requiring multiple transfusions  . Hyperlipidemia   . Thrombocytopenia   . Ascites     status post paracentesis  with removal of 3.4 L of ascitic fluid  . Osteoarthritis   . Mediastinal adenopathy 09/22/2011  . Cirrhosis   . Iron deficiency anemia   . Mediastinal adenopathy 09/22/2011  . Aortic stenosis 03/08/2012    a.  s/p tissue AVR 10/13 with Dr. [name];   b. Echo 10/13: mod LVH, EF 55-60%, tissue AVR not well seen, no leak, gradient not too high (mean 51mmHg), MAC, mild MR, mild LAE, PASP 38  . OSA (obstructive sleep apnea) 1999    DOES NOT USE CPAP    Past Surgical History  Procedure Laterality Date  . Coronary artery bypass graft   10/15/2002     [name]. [name], M.D.     . Carpal tunnel release    10/08/2003  . Lipoma surgery    . Hernia repair    . Other surgical history  08/26/2011    [facility],  enteroscopy , revealing "three-four AVMs."   . Tee without cardioversion  03/07/2012    Procedure: TRANSESOPHAGEAL ECHOCARDIOGRAM (TEE);  Surgeon: Thayer Headings, MD;  Location: [facility] ENDOSCOPY;  Service: Cardiovascular;  Laterality: N/A;  . Coronary angioplasty with stent placement  01/19/2005    drug eluting stent to high grade ostial stenosis of radial artery graft to OM  . Aortic valve replacement  04/25/2012    Procedure: AORTIC VALVE REPLACEMENT (AVR);  Surgeon: Melrose Nakayama, MD;  Location: [place];  Service: Open Heart Surgery;  Laterality: N/A;  .  Esophagogastroduodenoscopy N/A 07/31/2013    Procedure: ESOPHAGOGASTRODUODENOSCOPY (EGD);  Surgeon: Milus Banister, MD;  Location: [place];  Service: Endoscopy;  Laterality: N/A;  . Esophagogastroduodenoscopy Left 01/11/2014    Procedure: ESOPHAGOGASTRODUODENOSCOPY (EGD);  Surgeon: Juanita Craver, MD;  Location: [facility] ENDOSCOPY;  Service: Endoscopy;  Laterality: Left;    Current Facility-Administered Medications  Medication Dose Route Frequency Provider Last Rate Last Dose  . 0.9 %  sodium chloride infusion   Intravenous Continuous Deboraha Sprang, MD 50 mL/hr at 03/24/14 [phone]    . 0.9 %  sodium chloride infusion   Intravenous Continuous  Deboraha Sprang, MD      . ceFAZolin (ANCEF) 2-3 GM-% IVPB SOLR           . ceFAZolin (ANCEF) IVPB 2 g/50 mL premix  2 g Intravenous On Call Deboraha Sprang, MD      . chlorhexidine (HIBICLENS) 4 % liquid 4 application  60 mL Topical Once Deboraha Sprang, MD      . gentamicin (GARAMYCIN) 80 mg in sodium chloride irrigation 0.9 % 500 mL irrigation  80 mg Irrigation On Call Deboraha Sprang, MD      . mupirocin ointment (BACTROBAN) 2 %   Nasal BID Deboraha Sprang, MD   1 application at 83/38/25 [phone]    Allergies  Allergen Reactions  . Codeine Other (See Comments)    Hurting in chest  . Diltiazem Hcl Itching  . Niacin Other (See Comments)    Hot flashes    Review of Systems negative except from HPI and PMH  Physical Exam BP 93/53  Pulse 98  Temp(Src) 97.7 F (36.5 C) (Oral)  Resp 18  Ht 5\' 8"  (1.727 m)  Wt 250 lb (113.399 kg)  BMI 38.02 kg/m2  SpO2 99% Well developed and well nourished in no acute distress HENT normal E scleral and icterus clear Neck Supple JVP flat; carotids brisk and full Clear to ausculation Fast and irregularl irregular rate and rhythm, no murmurs gallops or rub Soft with active bowel sounds No clubbing cyanosis  Edema Alert and oriented, grossly normal motor and sensory function Skin Warm and Dry    Assessment and  Plan  ATrial fibrillation  HFpEF  Aortic stenosis s/p AVR  GI bleeding precluding anticoagulation  For pacemaker implantation for anticipated AV ablation The benefits and risks were reviewed including but not limited to death,  perforation, infection, lead dislodgement and device malfunction.  The patient understands agrees and is willing to proceed.

## 2014-03-24 NOTE — CV Procedure (Signed)
[name] [id number]  [id number]  Preop Dx: anticpated av junction ablation because of uncontrolled AFib Postop Dx same/   Cx: none apparent    Procedure single pacemaker implantation  After routine prep and drape, lidocaine was infiltrated in the prepectoral subclavicular region on the left side an incision was made and carried down to later the prepectoral fascia using electrocautery and sharp dissection a pocket was formed similarly. Hemostasis was obtained.  After this, we turned our attention to gaining accessm to the extrathoracic,left subclavian vein. This was accomplished without difficulty and without the aspiration of air or puncture of the artery. A single venopuncture was accomplished; guidewire  was placed and retained anda 7 French sheath through which were  passed an [facility] ventricular lead serial number [id number]  .  The ventricular lead was manipulated to the right ventricular apex with a bipolar R wave was 10,  the pacing impedance was 673, the threshold was 0.5 @ 0.4 msec   The lead was affixed to the prepectoral fascia and attached to a  [employer]  pulse generator  Serial number [id number] .  Hemostasis was obtained. The pocket was copiously irrigated with antibiotic containing saline solution. The leads and the pulse generator were placed in the pocket and affixed to the prepectoral fascia. The wound iwas then closed in 3  layers in normal fashion. The wound was washed dried and a DERMABOND  was applied.  Needle  count, sponge count  and instrument counts were correct at the end of the procedure   .[name] The patient tolerated the procedure without apparent complication.  EBL minimal

## 2014-03-24 NOTE — Progress Notes (Signed)
UR Completed Saraphina Lauderbaugh Graves-Bigelow, RN,BSN [phone]  

## 2014-03-25 ENCOUNTER — Telehealth (INDEPENDENT_AMBULATORY_CARE_PROVIDER_SITE_OTHER): Payer: Self-pay | Admitting: *Deleted

## 2014-03-25 ENCOUNTER — Encounter (HOSPITAL_COMMUNITY): Payer: Self-pay | Admitting: *Deleted

## 2014-03-25 ENCOUNTER — Ambulatory Visit (HOSPITAL_COMMUNITY): Payer: Medicare Other

## 2014-03-25 DIAGNOSIS — Z950 Presence of cardiac pacemaker: Secondary | ICD-10-CM | POA: Diagnosis not present

## 2014-03-25 DIAGNOSIS — I495 Sick sinus syndrome: Secondary | ICD-10-CM

## 2014-03-25 DIAGNOSIS — I5032 Chronic diastolic (congestive) heart failure: Secondary | ICD-10-CM | POA: Diagnosis not present

## 2014-03-25 DIAGNOSIS — I509 Heart failure, unspecified: Secondary | ICD-10-CM | POA: Diagnosis not present

## 2014-03-25 DIAGNOSIS — I2510 Atherosclerotic heart disease of native coronary artery without angina pectoris: Secondary | ICD-10-CM | POA: Diagnosis not present

## 2014-03-25 DIAGNOSIS — I442 Atrioventricular block, complete: Secondary | ICD-10-CM | POA: Diagnosis not present

## 2014-03-25 DIAGNOSIS — I4891 Unspecified atrial fibrillation: Secondary | ICD-10-CM | POA: Diagnosis not present

## 2014-03-25 DIAGNOSIS — E119 Type 2 diabetes mellitus without complications: Secondary | ICD-10-CM | POA: Diagnosis not present

## 2014-03-25 DIAGNOSIS — J449 Chronic obstructive pulmonary disease, unspecified: Secondary | ICD-10-CM | POA: Diagnosis not present

## 2014-03-25 DIAGNOSIS — J4489 Other specified chronic obstructive pulmonary disease: Secondary | ICD-10-CM | POA: Diagnosis not present

## 2014-03-25 DIAGNOSIS — D509 Iron deficiency anemia, unspecified: Secondary | ICD-10-CM

## 2014-03-25 LAB — GLUCOSE, CAPILLARY: Glucose-Capillary: 143 mg/dL — ABNORMAL HIGH (ref 70–99)

## 2014-03-25 NOTE — Telephone Encounter (Signed)
[name] just got out of the hospital from having a pace maker put in. He did have blood work done while he was there and would like to know if he still needed to have them done this week. If so, she will need to find someone to help her with him and daughter. The return phone number is [phone].

## 2014-03-25 NOTE — Telephone Encounter (Signed)
CBC noted for October 1 , 2015. Colonoscopy at a later date.

## 2014-03-25 NOTE — Discharge Instructions (Signed)
° ° °  Supplemental Discharge Instructions for  Pacemaker/Defibrillator Patients  Activity No heavy lifting or vigorous activity with your left/right arm for 6 to 8 weeks.  Do not raise your left/right arm above your head for one week.  Gradually raise your affected arm as drawn below.               9/2                            9/3                         9/4                           9/5 __  NO DRIVING for 1 week    ; you may begin driving on 02/26/01  WOUND CARE   Keep the wound area clean and dry.  Do not get this area wet for one week. No showers for one week; you may shower on     .   The tape/steri-strips on your wound will fall off; do not pull them off.  No bandage is needed on the site.  DO  NOT apply any creams, oils, or ointments to the wound area.   If you notice any drainage or discharge from the wound, any swelling or bruising at the site, or you develop a fever > 101? F after you are discharged home, call the office at once.  Special Instructions   You are still able to use cellular telephones; use the ear opposite the side where you have your pacemaker/defibrillator.  Avoid carrying your cellular phone near your device.   When traveling through airports, show security personnel your identification card to avoid being screened in the metal detectors.  Ask the security personnel to use the hand wand.   Avoid arc welding equipment, MRI testing (magnetic resonance imaging), TENS units (transcutaneous nerve stimulators).  Call the office for questions about other devices.   Avoid electrical appliances that are in poor condition or are not properly grounded.   Microwave ovens are safe to be near or to operate.  Additional information for defibrillator patients should your device go off:   If your device goes off ONCE and you feel fine afterward, notify the device clinic nurses.   If your device goes off ONCE and you do not feel well afterward, call 911.   If your device goes off  TWICE, call 911.   If your device goes off THREE times in one day, call 911.  DO NOT DRIVE YOURSELF OR A FAMILY MEMBER WITH A DEFIBRILLATOR TO THE HOSPITAL--CALL 911.

## 2014-03-25 NOTE — Telephone Encounter (Signed)
Per Dr.Rehman the patient will need to have labs drawn in 1 month. 

## 2014-03-25 NOTE — Telephone Encounter (Signed)
Results of blood work reviewed with patient. Hemoglobin is 9.1. 3 weeks ago was 8.3. BUN and creatinine are higher than they were three weeks ago. Patient advised to call Dr. Clayborne Dana about recommendations for diuretic therapy. CBC in 1 month. Colonoscopy at a later date

## 2014-03-25 NOTE — Telephone Encounter (Signed)
Per Dr.Rehman, patient will not need to have labs done. Patient has had the labs that we were requesting done on 03/24/14. Dr.Rehman states that he will call the patient. Patient may be reached at [phone].

## 2014-03-26 ENCOUNTER — Telehealth: Payer: Self-pay | Admitting: Internal Medicine

## 2014-03-26 NOTE — Telephone Encounter (Signed)
New problem   Need nurse to call pt to clarify his Metolazone dosage how much he need to take. Please call pt at [phone].

## 2014-03-26 NOTE — Telephone Encounter (Signed)
Patient had f/u OV in October, will schedule TCS at that time if ok with NUR

## 2014-03-27 ENCOUNTER — Telehealth: Payer: Self-pay | Admitting: Internal Medicine

## 2014-03-27 NOTE — Telephone Encounter (Signed)
New problem   Pt wanting to be advised when his ablation will be scheduled. Please call pt.

## 2014-03-28 NOTE — Telephone Encounter (Signed)
Scheduled AVN ablation 9/25. Pre procedure labs 9/14, when he comes for wound check from PPM implant. Letter of instructions reviewed with pt and left at front desk for him to pick up on 9/14. Patient verbalized understanding and agreeable to plan.

## 2014-03-28 NOTE — Telephone Encounter (Signed)
Patient states that they discharged him from the hospital on Metolazone BID (Monday/Friday) and he was taking it TID (M/W/F) prior. He would like clarification about this change. Will forward to Dr. Caryl Comes to review.

## 2014-04-01 NOTE — Telephone Encounter (Signed)
If no fluid continue metalozone as Rx

## 2014-04-02 NOTE — Telephone Encounter (Signed)
Informed patient of Dr. Olin Pia recommendation. Pt will call Dr. Haroldine Laws if fluid builds.

## 2014-04-04 ENCOUNTER — Encounter: Payer: Self-pay | Admitting: *Deleted

## 2014-04-07 ENCOUNTER — Other Ambulatory Visit (HOSPITAL_COMMUNITY): Payer: Self-pay | Admitting: Internal Medicine

## 2014-04-07 ENCOUNTER — Ambulatory Visit (INDEPENDENT_AMBULATORY_CARE_PROVIDER_SITE_OTHER): Payer: Medicare Other | Admitting: *Deleted

## 2014-04-07 DIAGNOSIS — Z950 Presence of cardiac pacemaker: Secondary | ICD-10-CM

## 2014-04-07 DIAGNOSIS — I4891 Unspecified atrial fibrillation: Secondary | ICD-10-CM | POA: Diagnosis not present

## 2014-04-07 DIAGNOSIS — I4821 Permanent atrial fibrillation: Secondary | ICD-10-CM

## 2014-04-07 DIAGNOSIS — I509 Heart failure, unspecified: Secondary | ICD-10-CM

## 2014-04-07 LAB — MDC_IDC_ENUM_SESS_TYPE_INCLINIC
Battery Voltage: 3.08 V
Brady Statistic RV Percent Paced: 13 %
Implantable Pulse Generator Model: 1240
Lead Channel Pacing Threshold Pulse Width: 0.4 ms
Lead Channel Setting Pacing Pulse Width: 0.4 ms
Lead Channel Setting Sensing Sensitivity: 2 mV
MDC IDC MSMT BATTERY REMAINING LONGEVITY: 129.6 mo
MDC IDC MSMT LEADCHNL RV IMPEDANCE VALUE: 750 Ohm
MDC IDC MSMT LEADCHNL RV PACING THRESHOLD AMPLITUDE: 0.5 V
MDC IDC MSMT LEADCHNL RV PACING THRESHOLD AMPLITUDE: 0.5 V
MDC IDC MSMT LEADCHNL RV PACING THRESHOLD PULSEWIDTH: 0.4 ms
MDC IDC MSMT LEADCHNL RV SENSING INTR AMPL: 12 mV
MDC IDC PG SERIAL: 3014543
MDC IDC SESS DTM: 20150914113424
MDC IDC SET LEADCHNL RV PACING AMPLITUDE: 3.5 V

## 2014-04-07 NOTE — Progress Notes (Signed)
Wound check appointment. Steri-strips removed prior to arrival by pt. Wound without redness or edema. Incision edges approximated, wound well healed. Normal device function. Threshold, sensing, and impedances consistent with implant measurements. Device programmed at 3.5V for extra safety margin until 3 month visit. Histogram distribution appropriate for patient and level of activity. No high ventricular rates noted. Patient educated about wound care, arm mobility, lifting restrictions. ROV w/ Dr. Caryl Comes 07/15/14 unless appt changed post AV node ablation (scheduled for 04/18/14).

## 2014-04-14 ENCOUNTER — Ambulatory Visit (HOSPITAL_COMMUNITY)
Admission: RE | Admit: 2014-04-14 | Discharge: 2014-04-14 | Disposition: A | Discharge status: Home / Self Care | Payer: Medicare Other | Source: Ambulatory Visit | Attending: Internal Medicine | Admitting: Internal Medicine

## 2014-04-14 ENCOUNTER — Encounter (HOSPITAL_COMMUNITY): Payer: Self-pay

## 2014-04-14 VITALS — BP 108/52 | HR 102 | Wt 256.0 lb

## 2014-04-14 DIAGNOSIS — Z87891 Personal history of nicotine dependence: Secondary | ICD-10-CM | POA: Insufficient documentation

## 2014-04-14 DIAGNOSIS — M109 Gout, unspecified: Secondary | ICD-10-CM | POA: Insufficient documentation

## 2014-04-14 DIAGNOSIS — N183 Chronic kidney disease, stage 3 unspecified: Secondary | ICD-10-CM | POA: Diagnosis not present

## 2014-04-14 DIAGNOSIS — I5033 Acute on chronic diastolic (congestive) heart failure: Secondary | ICD-10-CM

## 2014-04-14 DIAGNOSIS — I359 Nonrheumatic aortic valve disorder, unspecified: Secondary | ICD-10-CM | POA: Insufficient documentation

## 2014-04-14 DIAGNOSIS — Z794 Long term (current) use of insulin: Secondary | ICD-10-CM | POA: Diagnosis not present

## 2014-04-14 DIAGNOSIS — I4891 Unspecified atrial fibrillation: Secondary | ICD-10-CM

## 2014-04-14 DIAGNOSIS — I509 Heart failure, unspecified: Secondary | ICD-10-CM | POA: Diagnosis not present

## 2014-04-14 DIAGNOSIS — Z951 Presence of aortocoronary bypass graft: Secondary | ICD-10-CM

## 2014-04-14 DIAGNOSIS — I5032 Chronic diastolic (congestive) heart failure: Secondary | ICD-10-CM | POA: Insufficient documentation

## 2014-04-14 DIAGNOSIS — I129 Hypertensive chronic kidney disease with stage 1 through stage 4 chronic kidney disease, or unspecified chronic kidney disease: Secondary | ICD-10-CM | POA: Diagnosis not present

## 2014-04-14 DIAGNOSIS — E119 Type 2 diabetes mellitus without complications: Secondary | ICD-10-CM | POA: Insufficient documentation

## 2014-04-14 DIAGNOSIS — K7460 Unspecified cirrhosis of liver: Secondary | ICD-10-CM | POA: Diagnosis not present

## 2014-04-14 DIAGNOSIS — I4821 Permanent atrial fibrillation: Secondary | ICD-10-CM

## 2014-04-14 DIAGNOSIS — I2510 Atherosclerotic heart disease of native coronary artery without angina pectoris: Secondary | ICD-10-CM | POA: Diagnosis not present

## 2014-04-14 LAB — COMPREHENSIVE METABOLIC PANEL
ALK PHOS: 113 U/L (ref 39–117)
ALT: 52 U/L (ref 0–53)
AST: 48 U/L — ABNORMAL HIGH (ref 0–37)
Albumin: 4.3 g/dL (ref 3.5–5.2)
Anion gap: 16 — ABNORMAL HIGH (ref 5–15)
BUN: 43 mg/dL — ABNORMAL HIGH (ref 6–23)
CO2: 28 meq/L (ref 19–32)
Calcium: 10.3 mg/dL (ref 8.4–10.5)
Chloride: 94 mEq/L — ABNORMAL LOW (ref 96–112)
Creatinine, Ser: 1.6 mg/dL — ABNORMAL HIGH (ref 0.50–1.35)
GFR calc Af Amer: 51 mL/min — ABNORMAL LOW (ref >90)
GFR, EST NON AFRICAN AMERICAN: 44 mL/min — AB (ref >90)
Glucose, Bld: 222 mg/dL — ABNORMAL HIGH (ref 70–99)
POTASSIUM: 3.7 meq/L (ref 3.7–5.3)
SODIUM: 138 meq/L (ref 137–147)
Total Bilirubin: 0.6 mg/dL (ref 0.3–1.2)
Total Protein: 8.2 g/dL (ref 6.0–8.3)

## 2014-04-14 LAB — TSH: TSH: 4.06 u[IU]/mL (ref 0.350–4.500)

## 2014-04-14 MED ORDER — ALLOPURINOL 100 MG PO TABS: 100.0000 mg | ORAL_TABLET | Freq: Every day | ORAL | Status: DC

## 2014-04-14 MED ORDER — COLCHICINE 0.6 MG PO TABS: 0.6000 mg | ORAL_TABLET | Freq: Every day | ORAL | Status: DC

## 2014-04-14 NOTE — Progress Notes (Signed)
Patient ID: [name], male   DOB: 28-Apr-1946, 68 y.o.   MRN: [id number] Primary Cardiologist: Dr. Burt Knack GI: Dr Laural Golden- followed for cirrhosis Pulmonary: Dr Melvyn Novas  PCP: Elesa Hacker [facility])  Dr. Chalmers Cater (Endocrinologist)  Mr Fudala is a 68 yo with history of CAD s/p CABG, permanent atrial fibrillation, chronic diastolic CHF, severe AS s/p bioprosthetic AVR presents for evaluation of diastolic CHF.  Last LHC in 8/13 showed patent grafts.  AVR was in 10/13.  Last echo in 8/14 showed EF 55-60%.    RHC (12/2013): RA 14 RV 44/4/11 PA 50/23 (33) PCWP 20, v = 35 Fick CO/CI: 6.0/2.6 PVR 2.2 WU PA 59%  July 2015:  Admitted to the hospital for GI bleed requiring 2 PRBCs. No additional GI work-up with recent EGD showing gastritis.  Heart rate has been very difficult to control in atrial fibrillation, running in the 100s-110s at rest and up to 140s with minimal exertion.  He saw Dr. Caryl Comes who is planning for AV nodal ablation with pacing later this week.  Symptomatically, he is doing well currently.  Weight is down 6 lbs.  He has had no chest pain.  He is not short of breath walking around his house and is able to walk in [employer] without much problem.  Mild dyspnea with steps, hills.  He is now taking metolazone twice a week and torsemide 120 mg bid.  He had a gout flare recently and has been taking colchicine 0.6 mg bid but wants to stop it because of expense.    Labs (8/14) LDL 38 Labs (3/15) K 3.4, creatinine 1.7, BUN 55  Labs (10/15/13) K 4.0 Creatinine 1.6 BUN 32 Labs (10/29/13) K 4.0 Creatinine 1.6  Labs (4/15) K 4.2 Creatinine 1.32 => 1.5, HCT 35.2 Labs (12/11/13) K 3.6 Creatinine 1.56 Pro BNP 1152 Hemoglobin 9.6  Labs (01/12/14) K 3.0, creatinine 2.0, hemoglobin 8.8 Labs (01/31/14) K 3.3, creatinine 1.49 Labs (8/15) K 3.9, creatinine 1.89  PMH: 1. CAD: s/p CABG 2004. Last LHC (8/13) with patent free radial-OM, patent SVG-D, patent LIMA-LAD.  2. Permanent atrial fibrillation:  Not anticoagulated due to GI bleeding.  Holter monitor (4/15) with mean HR 115 (afib).  3. Chronic diastolic CHF: Echo (3/81) with EF 55-60%, bioprosthetic aortic valve with mean gradient 22 mmHg, mildly decreased RV systolic function. RHC (12/2013): RA 14, RV 44/4/11, PA 50/23 (33), PCWP 20, v = 35, Fick CO/CI: 6.0/2.6, PVR 2.2 WU, PA 59%.  Echo (8/15) with EF 55-60%, mild LVH, bioprosthetic aortic valve with mean gradient 16 mmHg, PA systolic pressure 46 mmHg, mild to moderate MR.  4. Severe aortic stenosis s/p bioprosthetic AVR in 10/13. Mean gradient 22 mmHg across valve on echo in 8/14.  Mean gradient 16 mmHg on echo in 8/15.  5. NASH: ascites, thrombocytopenia.  Had paracentesis in 1/15. Gynecomastia with spironolactone.  6. 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  EGD in 1/15 showed mild portal gastropathy and 2 small antral ulcers. Recurrent GI bleed in 6/15, EGD showed gastritis and ?GAVE.  7. Hyperlipidemia 8. HTN 9. GERD 10. COPD 37. OSA: Cannot tolerate CPAP.  12. CKD 13. Gout  SH: Lives with wife in [place], prior smoker.   FH: CAD  ROS: All systems reviewed and negative except as per HPI.   Current Outpatient Prescriptions on File Prior to Encounter  Medication Sig Dispense Refill  . amiodarone (PACERONE) 400 MG tablet Take 200 mg by mouth 2 (two) times daily.       [name]  atorvastatin (LIPITOR) 20 MG tablet Take 20 mg by mouth every morning.       . ferrous gluconate (FERGON) 325 MG tablet Take 325 mg by mouth 2 (two) times daily.       [name] gabapentin (NEURONTIN) 100 MG capsule Take 100 mg by mouth 3 (three) times daily.      . insulin NPH Human (HUMULIN N,NOVOLIN N) 100 UNIT/ML injection Inject 25 Units into the skin 2 (two) times daily before a meal.       . insulin regular (NOVOLIN R,HUMULIN R) 100 units/mL injection Inject 5-12 Units into the skin 3 (three) times daily before meals. Per sliding scale      . losartan (COZAAR) 50 MG tablet Take 50 mg by mouth daily. Takes 1/2 pill  daily      . metolazone (ZAROXOLYN) 2.5 MG tablet Take 2.5 mg by mouth 2 (two) times a week. Monday and Friday.      . metoprolol succinate (TOPROL-XL) 100 MG 24 hr tablet Take 100 mg by mouth 2 (two) times daily. Take with or immediately following a meal.      . Multiple Vitamins-Minerals ([employer] SPECTRAVITE ADULT 50+ PO) Take 1 tablet by mouth daily.      [name] omeprazole (PRILOSEC) 20 MG capsule Take 1 capsule (20 mg total) by mouth daily.  30 capsule  5  . potassium chloride (K-DUR,KLOR-CON) 10 MEQ tablet Take 60 mEq by mouth 2 (two) times daily.      [name] spironolactone (ALDACTONE) 25 MG tablet Take 0.5 tablets (12.5 mg total) by mouth daily.  15 tablet  3  . torsemide (DEMADEX) 20 MG tablet Take 6 tablets (120 mg total) by mouth 2 (two) times daily.  360 tablet  3  . verapamil (COVERA HS) 240 MG (CO) 24 hr tablet Take 360 mg by mouth daily with supper.       . vitamin C (ASCORBIC ACID) 500 MG tablet Take 500 mg by mouth 2 (two) times daily.       No current facility-administered medications on file prior to encounter.    Filed Vitals:   04/14/14 1146  BP: 108/52  Pulse: 102  Weight: 256 lb (116.121 kg)  SpO2: 95%    General: NAD Neck: JVP 7-8, no thyromegaly or thyroid nodule. Wife present  Lungs: Clear to auscultation bilaterally with normal respiratory effort. CV: Nondisplaced PMI.  Tachy, irregular S1/S2, no S3/S4, 2/6 early SEM RUSB.  No peripheral edema.  No carotid bruit.  Normal pedal pulses.  Abdomen: Soft, obese nontender, no hepatosplenomegaly, non distended.  Skin: Intact without lesions or rashes.  Neurologic: Alert and oriented x 3.  Psych: Normal affect. Extremities: No clubbing or cyanosis.   Assessment/Plan: 1. Chronic diastolic CHF:  EF 54-27% (02/2014).  Weight is down, overall doing better. Still NYHA class II-III symptoms.  He does not look particularly volume overloaded.   - Will continue torsemide 120 mg BID and metolazone 2.5 mg on Monday/Friday.  - BMET today 2.  Chronic atrial fibrillation:  CHADSVASC 3 - CHF, HTN, DM2. 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 We tried ASA, however it was stopped again due to GIB. HR has been very difficult to control, concerned that elevated HR contributes to CHF and exercise intolerance.  He is planned for AV nodal ablation and pacing later this week by Dr. Caryl Comes.  When HR is controlled, think that we can stop amiodarone as he is likely  in permanent atrial fibrillation at this point.  Will check LFTs and TSH today.  3. CKD stage III: baseline Cr 1.5-2.0. Check BMET today 4. Bioprosthetic AVR: 8/15 echo showed stable bioprosthetic aortic valve.  5. CAD: s/p CABG. No chest pain. Continue statin, BB and ARB. He has been off aspirin and anticoagulation with history of GI bleeding  6. Cirrhosis: NASH-related. Sees Dr. Laural Golden.  7. Gout: I will start him on allopurinol 100 mg daily.  He can cut colchicine back to once daily, and after he has been on allopurinol for 2 wks, he can stop colchicine entirely.   Brentney Goldbach,MD 04/14/2014

## 2014-04-14 NOTE — Patient Instructions (Signed)
Decrease Colchicine to 0.6 mg daily after taking Allopurinol for 2 weeks then stop Colchicine completely   Start Allopurinol 100 mg daily  Labs today  Your physician recommends that you schedule a follow-up appointment in: 3 weeks

## 2014-04-16 ENCOUNTER — Encounter (HOSPITAL_COMMUNITY): Payer: Self-pay | Admitting: Pharmacy Technician

## 2014-04-16 ENCOUNTER — Other Ambulatory Visit (INDEPENDENT_AMBULATORY_CARE_PROVIDER_SITE_OTHER): Payer: Self-pay | Admitting: *Deleted

## 2014-04-16 ENCOUNTER — Encounter (INDEPENDENT_AMBULATORY_CARE_PROVIDER_SITE_OTHER): Payer: Self-pay | Admitting: *Deleted

## 2014-04-16 DIAGNOSIS — D509 Iron deficiency anemia, unspecified: Secondary | ICD-10-CM

## 2014-04-18 ENCOUNTER — Ambulatory Visit (HOSPITAL_COMMUNITY)
Admission: RE | Admit: 2014-04-18 | Discharge: 2014-04-19 | Disposition: A | Discharge status: Home / Self Care | Payer: Medicare Other | Source: Ambulatory Visit | Attending: Internal Medicine | Admitting: Internal Medicine

## 2014-04-18 ENCOUNTER — Encounter (HOSPITAL_COMMUNITY)
Admission: RE | Disposition: A | Discharge status: Home / Self Care | Payer: Self-pay | Source: Ambulatory Visit | Attending: Internal Medicine

## 2014-04-18 DIAGNOSIS — J4489 Other specified chronic obstructive pulmonary disease: Secondary | ICD-10-CM | POA: Diagnosis not present

## 2014-04-18 DIAGNOSIS — Z951 Presence of aortocoronary bypass graft: Secondary | ICD-10-CM | POA: Diagnosis not present

## 2014-04-18 DIAGNOSIS — I359 Nonrheumatic aortic valve disorder, unspecified: Secondary | ICD-10-CM | POA: Diagnosis not present

## 2014-04-18 DIAGNOSIS — I10 Essential (primary) hypertension: Secondary | ICD-10-CM | POA: Diagnosis not present

## 2014-04-18 DIAGNOSIS — I350 Nonrheumatic aortic (valve) stenosis: Secondary | ICD-10-CM

## 2014-04-18 DIAGNOSIS — I442 Atrioventricular block, complete: Secondary | ICD-10-CM | POA: Diagnosis present

## 2014-04-18 DIAGNOSIS — G4733 Obstructive sleep apnea (adult) (pediatric): Secondary | ICD-10-CM | POA: Insufficient documentation

## 2014-04-18 DIAGNOSIS — Z950 Presence of cardiac pacemaker: Secondary | ICD-10-CM | POA: Diagnosis not present

## 2014-04-18 DIAGNOSIS — I2510 Atherosclerotic heart disease of native coronary artery without angina pectoris: Secondary | ICD-10-CM | POA: Insufficient documentation

## 2014-04-18 DIAGNOSIS — J449 Chronic obstructive pulmonary disease, unspecified: Secondary | ICD-10-CM | POA: Insufficient documentation

## 2014-04-18 DIAGNOSIS — D509 Iron deficiency anemia, unspecified: Secondary | ICD-10-CM | POA: Insufficient documentation

## 2014-04-18 DIAGNOSIS — I4891 Unspecified atrial fibrillation: Secondary | ICD-10-CM | POA: Insufficient documentation

## 2014-04-18 DIAGNOSIS — E785 Hyperlipidemia, unspecified: Secondary | ICD-10-CM | POA: Diagnosis not present

## 2014-04-18 DIAGNOSIS — Z9889 Other specified postprocedural states: Secondary | ICD-10-CM

## 2014-04-18 DIAGNOSIS — I5032 Chronic diastolic (congestive) heart failure: Secondary | ICD-10-CM | POA: Diagnosis not present

## 2014-04-18 DIAGNOSIS — K219 Gastro-esophageal reflux disease without esophagitis: Secondary | ICD-10-CM | POA: Diagnosis not present

## 2014-04-18 DIAGNOSIS — E119 Type 2 diabetes mellitus without complications: Secondary | ICD-10-CM | POA: Diagnosis not present

## 2014-04-18 DIAGNOSIS — Z8719 Personal history of other diseases of the digestive system: Secondary | ICD-10-CM

## 2014-04-18 DIAGNOSIS — I509 Heart failure, unspecified: Secondary | ICD-10-CM | POA: Diagnosis not present

## 2014-04-18 DIAGNOSIS — Z952 Presence of prosthetic heart valve: Secondary | ICD-10-CM | POA: Insufficient documentation

## 2014-04-18 DIAGNOSIS — Z6838 Body mass index (BMI) 38.0-38.9, adult: Secondary | ICD-10-CM | POA: Insufficient documentation

## 2014-04-18 DIAGNOSIS — I4821 Permanent atrial fibrillation: Secondary | ICD-10-CM | POA: Diagnosis present

## 2014-04-18 DIAGNOSIS — E663 Overweight: Secondary | ICD-10-CM | POA: Diagnosis present

## 2014-04-18 HISTORY — PX: ABLATION: SHX5711

## 2014-04-18 HISTORY — PX: AV NODE ABLATION: SHX5458

## 2014-04-18 LAB — GLUCOSE, CAPILLARY
GLUCOSE-CAPILLARY: 199 mg/dL — AB (ref 70–99)
Glucose-Capillary: 227 mg/dL — ABNORMAL HIGH (ref 70–99)

## 2014-04-18 LAB — CBC
HEMATOCRIT: 36.5 % — AB (ref 39.0–52.0)
Hemoglobin: 11.3 g/dL — ABNORMAL LOW (ref 13.0–17.0)
MCH: 25.1 pg — ABNORMAL LOW (ref 26.0–34.0)
MCHC: 31 g/dL (ref 30.0–36.0)
MCV: 81.1 fL (ref 78.0–100.0)
Platelets: 127 10*3/uL — ABNORMAL LOW (ref 150–400)
RBC: 4.5 MIL/uL (ref 4.22–5.81)
RDW: 19.7 % — ABNORMAL HIGH (ref 11.5–15.5)
WBC: 5.5 10*3/uL (ref 4.0–10.5)

## 2014-04-18 SURGERY — AV NODE ABLATION
Anesthesia: LOCAL

## 2014-04-18 MED ORDER — VERAPAMIL HCL 240 MG (CO) PO TB24: 240.0000 mg | ORAL_TABLET | Freq: Every day | ORAL | Status: DC

## 2014-04-18 MED ORDER — TORSEMIDE 20 MG PO TABS
120.0000 mg | ORAL_TABLET | Freq: Two times a day (BID) | ORAL | Status: DC
  Administered 2014-04-18 – 2014-04-19 (×2): 120 mg via ORAL
  Filled 2014-04-18 (×4): qty 1

## 2014-04-18 MED ORDER — INSULIN ASPART 100 UNIT/ML ~~LOC~~ SOLN
0.0000 [IU] | Freq: Three times a day (TID) | SUBCUTANEOUS | Status: DC
  Administered 2014-04-19: 3 [IU] via SUBCUTANEOUS

## 2014-04-18 MED ORDER — GABAPENTIN 100 MG PO CAPS
100.0000 mg | ORAL_CAPSULE | Freq: Three times a day (TID) | ORAL | Status: DC
  Administered 2014-04-18 – 2014-04-19 (×2): 100 mg via ORAL
  Filled 2014-04-18 (×5): qty 1

## 2014-04-18 MED ORDER — FENTANYL CITRATE 0.05 MG/ML IJ SOLN
INTRAMUSCULAR | Status: AC
  Filled 2014-04-18: qty 2

## 2014-04-18 MED ORDER — INSULIN ASPART 100 UNIT/ML ~~LOC~~ SOLN: 5.0000 [IU] | Freq: Three times a day (TID) | SUBCUTANEOUS | Status: DC

## 2014-04-18 MED ORDER — SPIRONOLACTONE 12.5 MG HALF TABLET
12.5000 mg | ORAL_TABLET | Freq: Every day | ORAL | Status: DC
  Administered 2014-04-19: 12.5 mg via ORAL
  Filled 2014-04-18: qty 1

## 2014-04-18 MED ORDER — ONDANSETRON HCL 4 MG/2ML IJ SOLN: 4.0000 mg | Freq: Four times a day (QID) | INTRAMUSCULAR | Status: DC | PRN

## 2014-04-18 MED ORDER — COLCHICINE 0.6 MG PO TABS
0.6000 mg | ORAL_TABLET | Freq: Every day | ORAL | Status: DC
  Administered 2014-04-19: 0.6 mg via ORAL
  Filled 2014-04-18: qty 1

## 2014-04-18 MED ORDER — LOSARTAN POTASSIUM 25 MG PO TABS
25.0000 mg | ORAL_TABLET | Freq: Every day | ORAL | Status: DC
  Administered 2014-04-19: 25 mg via ORAL
  Filled 2014-04-18: qty 1

## 2014-04-18 MED ORDER — ATORVASTATIN CALCIUM 20 MG PO TABS
20.0000 mg | ORAL_TABLET | Freq: Every day | ORAL | Status: DC
  Filled 2014-04-18: qty 1

## 2014-04-18 MED ORDER — HEPARIN (PORCINE) IN NACL 2-0.9 UNIT/ML-% IJ SOLN
INTRAMUSCULAR | Status: AC
  Filled 2014-04-18: qty 500

## 2014-04-18 MED ORDER — METOLAZONE 2.5 MG PO TABS: 2.5000 mg | ORAL_TABLET | ORAL | Status: DC

## 2014-04-18 MED ORDER — METOPROLOL SUCCINATE ER 100 MG PO TB24
100.0000 mg | ORAL_TABLET | Freq: Two times a day (BID) | ORAL | Status: DC
  Administered 2014-04-18 – 2014-04-19 (×2): 100 mg via ORAL
  Filled 2014-04-18 (×3): qty 1

## 2014-04-18 MED ORDER — SODIUM CHLORIDE 0.9 % IJ SOLN
3.0000 mL | Freq: Two times a day (BID) | INTRAMUSCULAR | Status: DC
  Administered 2014-04-18: 3 mL via INTRAVENOUS

## 2014-04-18 MED ORDER — MIDAZOLAM HCL 5 MG/5ML IJ SOLN
INTRAMUSCULAR | Status: AC
  Filled 2014-04-18: qty 5

## 2014-04-18 MED ORDER — SODIUM CHLORIDE 0.9 % IJ SOLN: 3.0000 mL | INTRAMUSCULAR | Status: DC | PRN

## 2014-04-18 MED ORDER — ACETAMINOPHEN 325 MG PO TABS: 650.0000 mg | ORAL_TABLET | ORAL | Status: DC | PRN

## 2014-04-18 MED ORDER — SODIUM CHLORIDE 0.9 % IV SOLN: 250.0000 mL | INTRAVENOUS | Status: DC | PRN

## 2014-04-18 MED ORDER — ALLOPURINOL 100 MG PO TABS
100.0000 mg | ORAL_TABLET | Freq: Every day | ORAL | Status: DC
  Administered 2014-04-19: 100 mg via ORAL
  Filled 2014-04-18: qty 1

## 2014-04-18 MED ORDER — SODIUM CHLORIDE 0.9 % IV SOLN: INTRAVENOUS | Status: AC

## 2014-04-18 MED ORDER — VERAPAMIL HCL ER 240 MG PO TBCR
240.0000 mg | EXTENDED_RELEASE_TABLET | Freq: Every day | ORAL | Status: DC
  Administered 2014-04-19: 240 mg via ORAL
  Filled 2014-04-18: qty 1

## 2014-04-18 MED ORDER — POTASSIUM CHLORIDE CRYS ER 20 MEQ PO TBCR
60.0000 meq | EXTENDED_RELEASE_TABLET | Freq: Two times a day (BID) | ORAL | Status: DC
  Administered 2014-04-18 – 2014-04-19 (×2): 60 meq via ORAL
  Filled 2014-04-18 (×3): qty 3

## 2014-04-18 MED ORDER — VITAMIN C 500 MG PO TABS
500.0000 mg | ORAL_TABLET | Freq: Two times a day (BID) | ORAL | Status: DC
  Administered 2014-04-18 – 2014-04-19 (×2): 500 mg via ORAL
  Filled 2014-04-18 (×3): qty 1

## 2014-04-18 MED ORDER — INSULIN NPH (HUMAN) (ISOPHANE) 100 UNIT/ML ~~LOC~~ SUSP
25.0000 [IU] | Freq: Every day | SUBCUTANEOUS | Status: DC
  Administered 2014-04-19: 25 [IU] via SUBCUTANEOUS
  Filled 2014-04-18: qty 10

## 2014-04-18 MED ORDER — BUPIVACAINE HCL (PF) 0.25 % IJ SOLN
INTRAMUSCULAR | Status: AC
  Filled 2014-04-18: qty 30

## 2014-04-18 MED ORDER — INSULIN NPH (HUMAN) (ISOPHANE) 100 UNIT/ML ~~LOC~~ SUSP
25.0000 [IU] | Freq: Every day | SUBCUTANEOUS | Status: DC
  Administered 2014-04-18: 25 [IU] via SUBCUTANEOUS

## 2014-04-18 NOTE — Interval H&P Note (Signed)
History and Physical Interval Note:  04/18/2014 12:30 PM  [name]  has presented today for surgery, with the diagnosis of AV node dysfunction  The various methods of treatment have been discussed with the patient and family. After consideration of risks, benefits and other options for treatment, the patient has consented to  Procedure(s): AV NODE ABLATION (N/A) as a surgical intervention .  The patient's history has been reviewed, patient examined, no change in status, stable for surgery.  I have reviewed the patient's chart and labs.  Questions were answered to the patient's satisfaction.     Virl Axe

## 2014-04-18 NOTE — CV Procedure (Signed)
RAI SEVERNS [id number]  [id number]  Preop Dx: Atrial fib with uncontrolled ventricular response; s/p pacer Postop Dx same/   Procedure:His measurement  AV ablation  Cx: None   EBL: Minimal    Dictation number [id number]  Virl Axe, MD 04/18/2014 2:12 PM

## 2014-04-18 NOTE — H&P (View-Only) (Signed)
Patient Care Team: [name], MD as PCP - General (Family Medicine) Rogene Houston, MD (Gastroenterology) Nobie Putnam, MD (Hematology and Oncology) Rounding L[facility]iology, MD as Consulting Physician (Cardiology) Sherren Mocha, MD as Consulting Physician (Cardiology)   HPI  [name] is a 68 y.o. male  Admitted for pacemaker for anticipated AV ablttion for uncontolled atrial fibrillation and assos HFpEF He is a history of ischemic heart disease with prior bypass surgery. He has a history of severe aortic stenosis with a bioprosthetic aVR. This was accomplished 10/13 with recent echo 8/14 demonstrated normal left ventricular function and surprisingly normal left atrial dimensions  He has a history of recurrent GI bleeding which is precluded the use of oral anticoagulation and this does not making a candidate for cardioversion. He has significant exercise impairment of exercise tolerance  Also COPD and untreated sleep apnea in the setting of morbid obesity. Also with anemai   Past Medical History  Diagnosis Date  . Overweight(278.02)   . CAD (coronary artery disease)     a. s/p CABG 2004;  b. [place] 10/13:  LHC 8/13: Free radial to obtuse marginal patent, SVG-diagonal patent, LIMA-LAD patent, EF 65-70%, mean aortic valve gradient 42  . Atrial fibrillation     Permanent; off of Coumadin for now due to GI bleed  . DM2 (diabetes mellitus, type 2)     at least 10 yrs  . Insomnia   . Carotid bruit 06/14/2011    a. pre-AVR dopplers 10/13: no sig ICA stenosis  . Hypertension     x 15 yrs  . Chronic diastolic heart failure   . GERD (gastroesophageal reflux disease)   . COPD (chronic obstructive pulmonary disease)   . Iron deficiency anemia     Requiring intravenous iron  . H/O hiatal hernia   . AVM (arteriovenous malformation)     Recurrent GI bleeding requiring multiple transfusions  . Hyperlipidemia   . Thrombocytopenia   . Ascites     status post paracentesis  with removal of 3.4 L of ascitic fluid  . Osteoarthritis   . Mediastinal adenopathy 09/22/2011  . Cirrhosis   . Iron deficiency anemia   . Mediastinal adenopathy 09/22/2011  . Aortic stenosis 03/08/2012    a.  s/p tissue AVR 10/13 with Dr. [name];   b. Echo 10/13: mod LVH, EF 55-60%, tissue AVR not well seen, no leak, gradient not too high (mean 1mmHg), MAC, mild MR, mild LAE, PASP 38  . OSA (obstructive sleep apnea) 1999    DOES NOT USE CPAP    Past Surgical History  Procedure Laterality Date  . Coronary artery bypass graft   10/15/2002     [name]. [name], M.D.     . Carpal tunnel release    10/08/2003  . Lipoma surgery    . Hernia repair    . Other surgical history  08/26/2011    [facility],  enteroscopy , revealing "three-four AVMs."   . Tee without cardioversion  03/07/2012    Procedure: TRANSESOPHAGEAL ECHOCARDIOGRAM (TEE);  Surgeon: Thayer Headings, MD;  Location: [facility] ENDOSCOPY;  Service: Cardiovascular;  Laterality: N/A;  . Coronary angioplasty with stent placement  01/19/2005    drug eluting stent to high grade ostial stenosis of radial artery graft to OM  . Aortic valve replacement  04/25/2012    Procedure: AORTIC VALVE REPLACEMENT (AVR);  Surgeon: Melrose Nakayama, MD;  Location: [place];  Service: Open Heart Surgery;  Laterality: N/A;  .  Esophagogastroduodenoscopy N/A 07/31/2013    Procedure: ESOPHAGOGASTRODUODENOSCOPY (EGD);  Surgeon: Milus Banister, MD;  Location: [place];  Service: Endoscopy;  Laterality: N/A;  . Esophagogastroduodenoscopy Left 01/11/2014    Procedure: ESOPHAGOGASTRODUODENOSCOPY (EGD);  Surgeon: Juanita Craver, MD;  Location: [facility] ENDOSCOPY;  Service: Endoscopy;  Laterality: Left;    Current Facility-Administered Medications  Medication Dose Route Frequency Provider Last Rate Last Dose  . 0.9 %  sodium chloride infusion   Intravenous Continuous Deboraha Sprang, MD 50 mL/hr at 03/24/14 [phone]    . 0.9 %  sodium chloride infusion   Intravenous Continuous  Deboraha Sprang, MD      . ceFAZolin (ANCEF) 2-3 GM-% IVPB SOLR           . ceFAZolin (ANCEF) IVPB 2 g/50 mL premix  2 g Intravenous On Call Deboraha Sprang, MD      . chlorhexidine (HIBICLENS) 4 % liquid 4 application  60 mL Topical Once Deboraha Sprang, MD      . gentamicin (GARAMYCIN) 80 mg in sodium chloride irrigation 0.9 % 500 mL irrigation  80 mg Irrigation On Call Deboraha Sprang, MD      . mupirocin ointment (BACTROBAN) 2 %   Nasal BID Deboraha Sprang, MD   1 application at 35/67/01 [phone]    Allergies  Allergen Reactions  . Codeine Other (See Comments)    Hurting in chest  . Diltiazem Hcl Itching  . Niacin Other (See Comments)    Hot flashes    Review of Systems negative except from HPI and PMH  Physical Exam BP 93/53  Pulse 98  Temp(Src) 97.7 F (36.5 C) (Oral)  Resp 18  Ht 5\' 8"  (1.727 m)  Wt 250 lb (113.399 kg)  BMI 38.02 kg/m2  SpO2 99% Well developed and well nourished in no acute distress HENT normal E scleral and icterus clear Neck Supple JVP flat; carotids brisk and full Clear to ausculation Fast and irregularl irregular rate and rhythm, no murmurs gallops or rub Soft with active bowel sounds No clubbing cyanosis  Edema Alert and oriented, grossly normal motor and sensory function Skin Warm and Dry    Assessment and  Plan  ATrial fibrillation  HFpEF  Aortic stenosis s/p AVR  GI bleeding precluding anticoagulation  For pacemaker implantation for anticipated AV ablation The benefits and risks were reviewed including but not limited to death,  perforation, infection, lead dislodgement and device malfunction.  The patient understands agrees and is willing to proceed.

## 2014-04-19 ENCOUNTER — Encounter (HOSPITAL_COMMUNITY): Payer: Self-pay | Admitting: Cardiology

## 2014-04-19 DIAGNOSIS — I4891 Unspecified atrial fibrillation: Secondary | ICD-10-CM | POA: Diagnosis not present

## 2014-04-19 DIAGNOSIS — J449 Chronic obstructive pulmonary disease, unspecified: Secondary | ICD-10-CM | POA: Diagnosis not present

## 2014-04-19 DIAGNOSIS — I509 Heart failure, unspecified: Secondary | ICD-10-CM | POA: Diagnosis not present

## 2014-04-19 DIAGNOSIS — Z9889 Other specified postprocedural states: Secondary | ICD-10-CM

## 2014-04-19 DIAGNOSIS — I5032 Chronic diastolic (congestive) heart failure: Secondary | ICD-10-CM | POA: Diagnosis not present

## 2014-04-19 DIAGNOSIS — I350 Nonrheumatic aortic (valve) stenosis: Secondary | ICD-10-CM

## 2014-04-19 DIAGNOSIS — E119 Type 2 diabetes mellitus without complications: Secondary | ICD-10-CM | POA: Diagnosis not present

## 2014-04-19 LAB — GLUCOSE, CAPILLARY
Glucose-Capillary: 202 mg/dL — ABNORMAL HIGH (ref 70–99)
Glucose-Capillary: 227 mg/dL — ABNORMAL HIGH (ref 70–99)

## 2014-04-19 NOTE — Discharge Instructions (Signed)
Heart Healthy diabetic diet No driving for 5 days No lifting over 5 pounds for 1 week No sex for 1 week You may return to work in 1 week    Keep site clean and dry you may shower but no tubs or pools for 1 week.  If any swelling or drainage or fever call the office.  You will be called Monday to arrange device clinic appt.

## 2014-04-19 NOTE — Progress Notes (Signed)
Physician Discharge Summary       Patient ID: [name] MRN: [id number] DOB/AGE: 66-Jun-1947 66 y.o.  Admit date: 04/18/2014 Discharge date: 04/19/2014  Discharge Diagnoses:  Principal Problem:   S/P AV nodal ablation 04/18/14 Active Problems:   Permanent atrial fibrillation   Atrioventricular block, complete   OVERWEIGHT/OBESITY   History of GI bleed   Chronic diastolic CHF (congestive heart failure)   Type 2 diabetes mellitus   Aortic stenosis, severe with S/P AVR    Discharged Condition: good  Procedures: 04/19/14 His bundle recording and successful ablation of the AV junction without escape rhythm by Dr. Avis Epley Course: 68 year old male admitted by Dr. Caryl Comes for AV ablation for uncontrolled atrial fib and associated HFpEF.  He has a history of ischemic heart disease with prior bypass surgery. He has a history of severe aortic stenosis with a bioprosthetic aVR. This was accomplished 10/13 with recent echo 8/14 demonstrated normal left ventricular function and surprisingly normal left atrial dimensions  He has a history of recurrent GI bleeding which is precluded the use of oral anticoagulation and this does not making a candidate for cardioversion.  He has significant exercise impairment of exercise tolerance  Also COPD and untreated sleep apnea in the setting of morbid obesity. Also with anemia.  On 04/18/14 pt underwent His bundle recording and AV junction ablation without complications.  He previously rec'd a PPM-single chamber, St Jude device 03/24/14.  Pt did well post op and was seen by Dr. Wynonia Lawman 04/19/14 and found stable for discharge.  He will follow up in the device clinic in 7-10 days -he already has appt with Dr. Caryl Comes in December.    Consults: None  Significant Diagnostic Studies:   EKG:  Ventricular-paced rhythm at 90 Abnormal ECG  Discharge Exam: Blood pressure 96/65, pulse 91, temperature 98.7 F (37.1 C), temperature source Oral, resp. rate  16, SpO2 100.00%.    Disposition: 01-Home or Self Care     Medication List    STOP taking these medications       amiodarone 400 MG tablet  Commonly known as:  PACERONE      TAKE these medications       allopurinol 100 MG tablet  Commonly known as:  ZYLOPRIM  Take 1 tablet (100 mg total) by mouth daily.     atorvastatin 20 MG tablet  Commonly known as:  LIPITOR  Take 20 mg by mouth every morning.     colchicine 0.6 MG tablet  Take 1 tablet (0.6 mg total) by mouth daily.     CVS SPECTRAVITE ADULT 50+ PO  Take 1 tablet by mouth daily.     ferrous gluconate 325 MG tablet  Commonly known as:  FERGON  Take 325 mg by mouth 2 (two) times daily.     gabapentin 100 MG capsule  Commonly known as:  NEURONTIN  Take 100 mg by mouth 3 (three) times daily.     insulin NPH Human 100 UNIT/ML injection  Commonly known as:  HUMULIN N,NOVOLIN N  Inject 25 Units into the skin 2 (two) times daily before a meal.     insulin regular 100 units/mL injection  Commonly known as:  NOVOLIN R,HUMULIN R  Inject 5-12 Units into the skin 3 (three) times daily before meals. Per sliding scale     losartan 50 MG tablet  Commonly known as:  COZAAR  Take 25 mg by mouth daily.     metolazone 2.5 MG tablet  Commonly known as:  ZAROXOLYN  Take 2.5 mg by mouth 2 (two) times a week. Monday and Friday.     metoprolol succinate 100 MG 24 hr tablet  Commonly known as:  TOPROL-XL  Take 100 mg by mouth 2 (two) times daily. Take with or immediately following a meal.     omeprazole 20 MG capsule  Commonly known as:  PRILOSEC  Take 1 capsule (20 mg total) by mouth daily.     potassium chloride 10 MEQ tablet  Commonly known as:  K-DUR,KLOR-CON  Take 60 mEq by mouth 2 (two) times daily.     spironolactone 25 MG tablet  Commonly known as:  ALDACTONE  Take 0.5 tablets (12.5 mg total) by mouth daily.     torsemide 20 MG tablet  Commonly known as:  DEMADEX  Take 6 tablets (120 mg total) by mouth 2  (two) times daily.     verapamil 240 MG (CO) 24 hr tablet  Commonly known as:  COVERA HS  Take 360 mg by mouth daily.     vitamin C 500 MG tablet  Commonly known as:  ASCORBIC ACID  Take 500 mg by mouth 2 (two) times daily.       Follow-up Information   Follow up with Virl Axe, MD. (for device clinic check, the office will call Monday with date in time in the next 10 days. )    Specialty:  Cardiology   Contact information:   [address] [place] [address] [phone]        Discharge Instructions: Heart Healthy diabetic diet No driving for 5 days No lifting over 5 pounds for 1 week No sex for 1 week You may return to work in 1 week    Keep site clean and dry you may shower but no tubs or pools for 1 week.  If any swelling or drainage or fever call the office.  You will be called Monday to arrange device clinic appt.      Signed: Isaiah Serge Nurse Practitioner-Certified [facility]: [facility] 04/19/2014, 12:05 PM  Time spent on discharge : >30 minutes.

## 2014-04-19 NOTE — Progress Notes (Signed)
Subjective:  Feels well today and no complaints of shortness of breath or chest pain. Cath site is fine.  Objective:  Vital Signs in the last 24 hours: BP 100/70  Pulse 90  Temp(Src) 97.5 F (36.4 C) (Oral)  Resp 18  SpO2 98%  Physical Exam: Pleasant male in no acute distress Lungs:  Clear  Cardiac:  Regular rhythm, normal S1 and S2, no S3 Extremities:  Femoral cath site clean and dry without hematoma  Intake/Output from previous day: 09/25 0701 - 09/26 0700 In: 480 [P.O.:480] Out: 600 [Urine:600]  Lab Results:  CBC:  Recent Labs  04/18/14 1128  WBC 5.5  HGB 11.3*  HCT 36.5*  MCV 81.1  PLT 127*    BNP    Component Value Date/Time   PROBNP 1152.0* 12/11/2013 0921    Telemetry: Ventricular Paced rhythm rate 90  Assessment/Plan:  1. Recent AV nodal ablation for atrial fibrillation now pacing 2. Coronary artery disease with previous bypass stable  Recommendations:  Okay to go home today. Will need followup in device clinic in 10 days for reprogramming.      Kerry Hough  MD [facility] Cardiology  04/19/2014, 9:56 AM

## 2014-04-19 NOTE — Op Note (Signed)
NAMEDUBLIN, GRAYER NO.:  [id number]  MEDICAL RECORD NO.:  [id number]  LOCATION:  [id number]                        FACILITY:  [facility]  PHYSICIAN:  Deboraha Sprang, MD, FACCDATE OF BIRTH:  1945/07/28  DATE OF PROCEDURE:  04/18/2014 DATE OF DISCHARGE:                              OPERATIVE REPORT   PREOPERATIVE DIAGNOSES:  Atrial fibrillation with uncontrolled ventricular response and congestive heart failure with a previously implanted pacemaker.  POSTOPERATIVE DIAGNOSES:  Atrial fibrillation with uncontrolled ventricular response and congestive heart failure with a previously implanted pacemaker.  PROCEDURES:  His bundle recording and AV junction ablation.  DESCRIPTION OF PROCEDURE:  Following obtaining informed consent, the patient was brought to the electrophysiology laboratory and placed on the fluoroscopic table in supine position.  After routine prep and drape, cardiac catheterization was performed with local anesthesia and conscious sedation.  Following the procedure, the catheters were removed.  Hemostasis was obtained.  The patient was transferred to the holding area in stable condition.  CATHETERS:  A 7-French 4-mm deflectable tip catheter was inserted via SR0 long sheath, two mapping sites of the AV junction.  The His bundle measurement was recorded, it was 65 milliseconds.  Radiofrequency energy was delivered at a site where the AV ratio was approximately 2:3.  A total of 1 minute of RF was applied.  Complete heart block ensued without junctional escape.  FLUOROSCOPY TIME:  A total of 1.2 minutes of fluoroscopy time was utilized.  IMPRESSION: 1. Modestly prolongation of the HV interval. 2. Successful ablation of the AV junction without escape rhythm.  The patient's device was reprogrammed to the VVI 90 mode.  The patient tolerated the procedure without apparent complication.     Deboraha Sprang, MD, [facility]     SCK/MEDQ  D:  04/18/2014   T:  04/19/2014  Job:  [id number]

## 2014-04-19 NOTE — Discharge Summary (Signed)
Physician Discharge Summary         Patient ID: [name] MRN: [id number] DOB/AGE: 03/14/1946 68 y.o.   Admit date: 04/18/2014 Discharge date: 04/19/2014   Discharge Diagnoses:  Principal Problem:   S/P AV nodal ablation 04/18/14 Active Problems:   Permanent atrial fibrillation   Atrioventricular block, complete   OVERWEIGHT/OBESITY   History of GI bleed   Chronic diastolic CHF (congestive heart failure)   Type 2 diabetes mellitus   Aortic stenosis, severe with S/P AVR     Discharged Condition: good   Procedures: 04/19/14 His bundle recording and successful ablation of the AV junction without escape rhythm by Dr. Avis Epley Course: 68 year old male admitted by Dr. Caryl Comes for AV ablation for uncontrolled atrial fib and associated HFpEF.  He has a history of ischemic heart disease with prior bypass surgery. He has a history of severe aortic stenosis with a bioprosthetic aVR. This was accomplished 10/13 with recent echo 8/14 demonstrated normal left ventricular function and surprisingly normal left atrial dimensions   He has a history of recurrent GI bleeding which is precluded the use of oral anticoagulation and this does not making a candidate for cardioversion.   He has significant exercise impairment of exercise tolerance   Also COPD and untreated sleep apnea in the setting of morbid obesity. Also with anemia.   On 04/18/14 pt underwent His bundle recording and AV junction ablation without complications.  He previously rec'd a PPM-single chamber, St Jude device 03/24/14.  Pt did well post op and was seen by Dr. Wynonia Lawman 04/19/14 and found stable for discharge.  He will follow up in the device clinic in 7-10 days -he already has appt with Dr. Caryl Comes in December.      Consults: None   Significant Diagnostic Studies:    EKG:  Ventricular-paced rhythm at 90 Abnormal ECG   Discharge Exam: Blood pressure 96/65, pulse 91, temperature 98.7 F (37.1 C),  temperature source Oral, resp. rate 16, SpO2 100.00%.     Disposition: 01-Home or Self Care      Medication List      STOP taking these medications           amiodarone 400 MG tablet   Commonly known as:  PACERONE          TAKE these medications           allopurinol 100 MG tablet   Commonly known as:  ZYLOPRIM   Take 1 tablet (100 mg total) by mouth daily.         atorvastatin 20 MG tablet   Commonly known as:  LIPITOR   Take 20 mg by mouth every morning.         colchicine 0.6 MG tablet   Take 1 tablet (0.6 mg total) by mouth daily.         CVS SPECTRAVITE ADULT 50+ PO   Take 1 tablet by mouth daily.         ferrous gluconate 325 MG tablet   Commonly known as:  FERGON   Take 325 mg by mouth 2 (two) times daily.         gabapentin 100 MG capsule   Commonly known as:  NEURONTIN   Take 100 mg by mouth 3 (three) times daily.         insulin NPH Human 100 UNIT/ML injection   Commonly known as:  HUMULIN N,NOVOLIN N   Inject 25 Units into the  skin 2 (two) times daily before a meal.         insulin regular 100 units/mL injection   Commonly known as:  NOVOLIN R,HUMULIN R   Inject 5-12 Units into the skin 3 (three) times daily before meals. Per sliding scale         losartan 50 MG tablet   Commonly known as:  COZAAR   Take 25 mg by mouth daily.         metolazone 2.5 MG tablet   Commonly known as:  ZAROXOLYN   Take 2.5 mg by mouth 2 (two) times a week. Monday and Friday.         metoprolol succinate 100 MG 24 hr tablet   Commonly known as:  TOPROL-XL   Take 100 mg by mouth 2 (two) times daily. Take with or immediately following a meal.         omeprazole 20 MG capsule   Commonly known as:  PRILOSEC   Take 1 capsule (20 mg total) by mouth daily.         potassium chloride 10 MEQ tablet   Commonly known as:  K-DUR,KLOR-CON   Take 60 mEq by mouth 2 (two) times daily.         spironolactone 25 MG tablet   Commonly known as:  ALDACTONE   Take 0.5  tablets (12.5 mg total) by mouth daily.         torsemide 20 MG tablet   Commonly known as:  DEMADEX   Take 6 tablets (120 mg total) by mouth 2 (two) times daily.         verapamil 240 MG (CO) 24 hr tablet   Commonly known as:  COVERA HS   Take 360 mg by mouth daily.         vitamin C 500 MG tablet   Commonly known as:  ASCORBIC ACID   Take 500 mg by mouth 2 (two) times daily.           Follow-up Information     Follow up with Virl Axe, MD. (for device clinic check, the office will call Monday with date in time in the next 10 days. )      Specialty:  Cardiology     Contact information:     [address] [place] [address] [phone]            Discharge Instructions: Heart Healthy diabetic diet No driving for 5 days No lifting over 5 pounds for 1 week No sex for 1 week You may return to work in 1 week     Keep site clean and dry you may shower but no tubs or pools for 1 week.   If any swelling or drainage or fever call the office.   You will be called Monday to arrange device clinic appt.       Kerry Hough. MD [facility]   04/19/2014, 12:05 PM

## 2014-04-20 ENCOUNTER — Ambulatory Visit (HOSPITAL_BASED_OUTPATIENT_CLINIC_OR_DEPARTMENT_OTHER): Payer: Medicare Other | Attending: Internal Medicine

## 2014-04-20 DIAGNOSIS — G4733 Obstructive sleep apnea (adult) (pediatric): Secondary | ICD-10-CM | POA: Diagnosis not present

## 2014-04-20 DIAGNOSIS — R400 Somnolence: Secondary | ICD-10-CM

## 2014-04-25 ENCOUNTER — Ambulatory Visit (INDEPENDENT_AMBULATORY_CARE_PROVIDER_SITE_OTHER): Payer: Medicare Other | Admitting: *Deleted

## 2014-04-25 DIAGNOSIS — Z9889 Other specified postprocedural states: Secondary | ICD-10-CM

## 2014-04-25 DIAGNOSIS — I4891 Unspecified atrial fibrillation: Secondary | ICD-10-CM

## 2014-04-25 LAB — MDC_IDC_ENUM_SESS_TYPE_INCLINIC
Battery Remaining Longevity: 126 mo
Battery Voltage: 3.05 V
Brady Statistic RV Percent Paced: 99.84 %
Date Time Interrogation Session: 20151002152850
Implantable Pulse Generator Serial Number: 3014543
Lead Channel Impedance Value: 837.5 Ohm
Lead Channel Pacing Threshold Amplitude: 0.5 V
Lead Channel Pacing Threshold Pulse Width: 0.4 ms
Lead Channel Sensing Intrinsic Amplitude: 12 mV
Lead Channel Setting Sensing Sensitivity: 6 mV
MDC IDC SET LEADCHNL RV PACING AMPLITUDE: 0.75 V
MDC IDC SET LEADCHNL RV PACING PULSEWIDTH: 0.4 ms

## 2014-04-25 NOTE — Progress Notes (Signed)
Post AV node ablation protocol. Changed rate from 90 to 80bpm. Turned on sensor. Turned off hysteresis. ROV w/ device clinic 05/29/14 for next rate change & w/ Dr. Caryl Comes 07/15/14.

## 2014-04-25 NOTE — Telephone Encounter (Signed)
Open in error

## 2014-04-26 ENCOUNTER — Encounter (HOSPITAL_COMMUNITY): Payer: Self-pay | Admitting: *Deleted

## 2014-04-28 DIAGNOSIS — Z23 Encounter for immunization: Secondary | ICD-10-CM | POA: Diagnosis not present

## 2014-04-28 DIAGNOSIS — G4733 Obstructive sleep apnea (adult) (pediatric): Secondary | ICD-10-CM

## 2014-04-28 NOTE — Sleep Study (Signed)
   NAME: [name] DATE OF BIRTH:  Aug 17, 1945 MEDICAL RECORD NUMBER [id number]  LOCATION: [facility]  PHYSICIAN: Kathee Delton  DATE OF STUDY: 04/20/2014  SLEEP STUDY TYPE: Nocturnal Polysomnogram               REFERRING PHYSICIAN: Deboraha Sprang, MD  INDICATION FOR STUDY: Hypersomnia with sleep apnea  EPWORTH SLEEPINESS SCORE:  4 HEIGHT:    WEIGHT:      There is no weight on file to calculate BMI.  NECK SIZE: 18.5 in.  MEDICATIONS: Reviewed in sleep record  SLEEP ARCHITECTURE: The patient had a total sleep time of 47 minutes with no slow-wave sleep or REM achieved. Sleep onset latency was prolonged at 188 minutes, and sleep efficiency was very poor at 11%.  RESPIRATORY DATA: The patient was found to have 10 apneas and 36 obstructive hypoxemia is, giving him an AHI of 59 events per hour with his total sleep time of only 47 minutes. The patient basically had sleep disordered breathing any time he was actually asleep.  Moderate snoring was noted throughout, and the events occurred in all body positions.  OXYGEN DATA: There was oxygen desaturation as low as 83% with the patient's obstructive events  CARDIAC DATA: Occasional PVC noted  MOVEMENT/PARASOMNIA: No significant periodic limb movements or abnormal behaviors were seen.  IMPRESSION/ RECOMMENDATION:    1) severe obstructive sleep apnea/hypopnea syndrome, with an AHI of 59 events per hour and oxygen desaturation as low as 83%. The patient had a total sleep time of only 47 minutes due to sleep onset issues and complaints related to neuropathy. However, he was having sleep disordered breathing any time that he was asleep.  There is no question that he has significant OSA. Treatment should focus on a trial of CPAP, as well as aggressive weight loss.  2) occasional PVC noted, but no clinically significant arrhythmias were seen   Kathee Delton Diplomate, American Board of Sleep  Medicine  ELECTRONICALLY SIGNED ON:  04/28/2014, 3:02 PM [place] PH: [phone]   FX: [phone] [place]

## 2014-04-29 ENCOUNTER — Encounter (INDEPENDENT_AMBULATORY_CARE_PROVIDER_SITE_OTHER): Payer: Self-pay | Admitting: Internal Medicine

## 2014-04-29 ENCOUNTER — Ambulatory Visit (INDEPENDENT_AMBULATORY_CARE_PROVIDER_SITE_OTHER): Payer: Medicare Other | Admitting: Internal Medicine

## 2014-04-29 VITALS — BP 102/68 | HR 72 | Temp 96.9°F | Resp 18 | Ht 68.0 in | Wt 252.8 lb

## 2014-04-29 DIAGNOSIS — R188 Other ascites: Secondary | ICD-10-CM

## 2014-04-29 DIAGNOSIS — D509 Iron deficiency anemia, unspecified: Secondary | ICD-10-CM

## 2014-04-29 DIAGNOSIS — I25810 Atherosclerosis of coronary artery bypass graft(s) without angina pectoris: Secondary | ICD-10-CM | POA: Diagnosis not present

## 2014-04-29 DIAGNOSIS — K7460 Unspecified cirrhosis of liver: Secondary | ICD-10-CM

## 2014-04-29 NOTE — Patient Instructions (Signed)
Next blood work would be on 07/04/2014. Call If you develop abdominal distention rectal bleeding.

## 2014-04-29 NOTE — Progress Notes (Signed)
Presenting complaint;  Followup for cirrhosis, GERD and IDA.  Subjective:  Patient is 68 year old Caucasian male who presents for scheduled visit. He had permanent pacemaker placed on 03/24/2014 and he had AV node ablation on 04/18/2014. Patient feels a lot better. He says he goes to [facility] her hospital and able to walk without any difficulty. He has not noted relapse of heartburn since PPI dose was decreased. He remains with good appetite. He has not lost any weight since his last visit. Bowels generally move daily. He denies melena rectal bleeding or abdominal pain or distention.  Current Medications: Outpatient Encounter Prescriptions as of 04/29/2014  Medication Sig  . allopurinol (ZYLOPRIM) 100 MG tablet Take 1 tablet (100 mg total) by mouth daily.  [name] atorvastatin (LIPITOR) 20 MG tablet Take 20 mg by mouth every morning.   . ferrous gluconate (FERGON) 325 MG tablet Take 325 mg by mouth 2 (two) times daily.   [name] gabapentin (NEURONTIN) 100 MG capsule Take 100 mg by mouth 3 (three) times daily.  . insulin NPH Human (HUMULIN N,NOVOLIN N) 100 UNIT/ML injection Inject 25 Units into the skin 2 (two) times daily before a meal.   . insulin regular (NOVOLIN R,HUMULIN R) 100 units/mL injection Inject 5-12 Units into the skin 3 (three) times daily before meals. Per sliding scale  . losartan (COZAAR) 50 MG tablet Take 25 mg by mouth daily.   . metolazone (ZAROXOLYN) 2.5 MG tablet Take 2.5 mg by mouth 2 (two) times a week. Monday and Friday.  . metoprolol succinate (TOPROL-XL) 100 MG 24 hr tablet Take 100 mg by mouth 2 (two) times daily. Take with or immediately following a meal.  . Multiple Vitamins-Minerals (CVS SPECTRAVITE ADULT 50+ PO) Take 1 tablet by mouth daily.  [name] omeprazole (PRILOSEC) 20 MG capsule Take 1 capsule (20 mg total) by mouth daily.  . potassium chloride (K-DUR,KLOR-CON) 10 MEQ tablet Take 60 mEq by mouth 2 (two) times daily.  [name] spironolactone (ALDACTONE) 25 MG tablet Take 0.5  tablets (12.5 mg total) by mouth daily.  [name] torsemide (DEMADEX) 20 MG tablet Take 6 tablets (120 mg total) by mouth 2 (two) times daily.  . verapamil (COVERA HS) 240 MG (CO) 24 hr tablet Take 360 mg by mouth daily.   . vitamin C (ASCORBIC ACID) 500 MG tablet Take 500 mg by mouth 2 (two) times daily.  . [DISCONTINUED] colchicine 0.6 MG tablet Take 1 tablet (0.6 mg total) by mouth daily.     Objective: Blood pressure 102/68, pulse 72, temperature 96.9 F (36.1 C), temperature source Oral, resp. rate 18, height 5\' 8"  (1.727 m), weight 252 lb 12.8 oz (114.669 kg). Patient is alert and in no acute distress. He does not have asterixis. Conjunctiva is pink. Sclera is nonicteric Oropharyngeal mucosa is normal. No neck masses or thyromegaly noted. Cardiac exam with regular rhythm normal S1 and S2. He has faint systolic ejection murmur at at LLSB and AA. Lungs are clear to auscultation. Abdomen is obese but shifting dullness is absent. Abdomen is soft nontender without organomegaly or masses.  No LE edema or clubbing noted.  Labs/studies Results: Lab data from 04/18/2014  WBC 5.5, H&H 11.3 and 36.5 and platelet count 127K.     Assessment:  #1. Iron deficiency anemia. H&H is coming. No evidence of overt GI bleed. He has history of GI angiodysplasia which is possibly source of his iron deficiency anemia. His stool previously has been heme-positive. He has undergone multiple EGDs 3 years ago and had  one in June this year. He underwent colonoscopy in January and again in September 2012. He had one polyp removed on second colonoscopy. Unless he has active bleeding was delayed colonoscopy until next year. #2. GERD. Heartburn well controlled with single dose of PPI. #3. Cirrhotic ascites. Exam is negative for ascites. Last Pap was in January of this year. #4. Obesity. He needs to make extra effort to lose weight which would help slow down progression of his liver disease.   Plan:  CBC, LFTs and AFP  in 07/04/2014. Abdominal ultrasound in January next year. Colonoscopy next year unless hemoglobin drops again.  Office visit in 4 months.

## 2014-04-30 ENCOUNTER — Encounter: Payer: Self-pay | Admitting: Internal Medicine

## 2014-05-05 ENCOUNTER — Ambulatory Visit (HOSPITAL_COMMUNITY)
Admission: RE | Admit: 2014-05-05 | Discharge: 2014-05-05 | Disposition: A | Discharge status: Home / Self Care | Payer: Medicare Other | Source: Ambulatory Visit | Attending: Internal Medicine | Admitting: Internal Medicine

## 2014-05-05 ENCOUNTER — Encounter (HOSPITAL_COMMUNITY): Payer: Self-pay

## 2014-05-05 VITALS — BP 98/64 | HR 83 | Wt 256.8 lb

## 2014-05-05 DIAGNOSIS — I4821 Permanent atrial fibrillation: Secondary | ICD-10-CM

## 2014-05-05 DIAGNOSIS — I2510 Atherosclerotic heart disease of native coronary artery without angina pectoris: Secondary | ICD-10-CM | POA: Insufficient documentation

## 2014-05-05 DIAGNOSIS — Z951 Presence of aortocoronary bypass graft: Secondary | ICD-10-CM | POA: Diagnosis not present

## 2014-05-05 DIAGNOSIS — K5521 Angiodysplasia of colon with hemorrhage: Secondary | ICD-10-CM | POA: Diagnosis not present

## 2014-05-05 DIAGNOSIS — I129 Hypertensive chronic kidney disease with stage 1 through stage 4 chronic kidney disease, or unspecified chronic kidney disease: Secondary | ICD-10-CM | POA: Insufficient documentation

## 2014-05-05 DIAGNOSIS — Z794 Long term (current) use of insulin: Secondary | ICD-10-CM | POA: Insufficient documentation

## 2014-05-05 DIAGNOSIS — K219 Gastro-esophageal reflux disease without esophagitis: Secondary | ICD-10-CM | POA: Insufficient documentation

## 2014-05-05 DIAGNOSIS — M109 Gout, unspecified: Secondary | ICD-10-CM | POA: Diagnosis not present

## 2014-05-05 DIAGNOSIS — Z952 Presence of prosthetic heart valve: Secondary | ICD-10-CM | POA: Insufficient documentation

## 2014-05-05 DIAGNOSIS — K7581 Nonalcoholic steatohepatitis (NASH): Secondary | ICD-10-CM | POA: Insufficient documentation

## 2014-05-05 DIAGNOSIS — E785 Hyperlipidemia, unspecified: Secondary | ICD-10-CM | POA: Insufficient documentation

## 2014-05-05 DIAGNOSIS — I5032 Chronic diastolic (congestive) heart failure: Secondary | ICD-10-CM

## 2014-05-05 DIAGNOSIS — N183 Chronic kidney disease, stage 3 (moderate): Secondary | ICD-10-CM | POA: Diagnosis not present

## 2014-05-05 DIAGNOSIS — G4733 Obstructive sleep apnea (adult) (pediatric): Secondary | ICD-10-CM | POA: Insufficient documentation

## 2014-05-05 DIAGNOSIS — I482 Chronic atrial fibrillation: Secondary | ICD-10-CM | POA: Diagnosis not present

## 2014-05-05 DIAGNOSIS — Z79899 Other long term (current) drug therapy: Secondary | ICD-10-CM | POA: Diagnosis not present

## 2014-05-05 DIAGNOSIS — I4891 Unspecified atrial fibrillation: Secondary | ICD-10-CM | POA: Diagnosis not present

## 2014-05-05 LAB — BASIC METABOLIC PANEL
Anion gap: 16 — ABNORMAL HIGH (ref 5–15)
BUN: 48 mg/dL — ABNORMAL HIGH (ref 6–23)
CO2: 31 mEq/L (ref 19–32)
Calcium: 9.7 mg/dL (ref 8.4–10.5)
Chloride: 87 mEq/L — ABNORMAL LOW (ref 96–112)
Creatinine, Ser: 1.58 mg/dL — ABNORMAL HIGH (ref 0.50–1.35)
GFR calc Af Amer: 51 mL/min — ABNORMAL LOW (ref >90)
GFR, EST NON AFRICAN AMERICAN: 44 mL/min — AB (ref >90)
Glucose, Bld: 338 mg/dL — ABNORMAL HIGH (ref 70–99)
Potassium: 3.5 mEq/L — ABNORMAL LOW (ref 3.7–5.3)
SODIUM: 134 meq/L — AB (ref 137–147)

## 2014-05-05 LAB — PRO B NATRIURETIC PEPTIDE: PRO B NATRI PEPTIDE: 780 pg/mL — AB (ref 0–125)

## 2014-05-05 MED ORDER — METOLAZONE 2.5 MG PO TABS
2.5000 mg | ORAL_TABLET | ORAL | Status: DC
Start: 2014-05-05 — End: 2014-06-05

## 2014-05-05 NOTE — Progress Notes (Signed)
Patient ID: [name], male   DOB: [birth year], 68 y.o.   MRN: [id number] Primary Cardiologist: Dr. Burt Knack GI: Dr Laural Golden- followed for cirrhosis Pulmonary: Dr Melvyn Novas  PCP: Elesa Hacker [facility])  Dr. Chalmers Cater (Endocrinologist)  Mr [name] is a 68 yo with history of CAD s/p CABG, permanent atrial fibrillation, chronic diastolic CHF, severe AS s/p bioprosthetic AVR presents for evaluation of diastolic CHF.  Last LHC in 8/13 showed patent grafts.  AVR was in 10/13.  Last echo in 8/14 showed EF 55-60%.    RHC (12/2013): RA 14 RV 44/4/11 PA 50/23 (33) PCWP 20, v = 35 Fick CO/CI: 6.0/2.6 PVR 2.2 WU PA 59%  July 2015:  Admitted to the hospital for GI bleed requiring 2 PRBCs. No additional GI work-up with recent EGD showing gastritis.  Heart rate was very difficult to control in atrial fibrillation, running in the 100s-110s at rest and up to 140s with minimal exertion.  He had an AV nodal ablation in 9/15 and feels much better now.  He still has mild dyspnea with heavy activities but has had considerable improvement.  No problems shopping/walking in stores.  Does fine walking on flat ground.  He had been on amiodarone for rate control and this has now been stopped.  Weight is stable.   ECG: atrial fibrillation with v-pacing at 84  Labs (8/14) LDL 38 Labs (3/15) K 3.4, creatinine 1.7, BUN 55  Labs (10/15/13) K 4.0 Creatinine 1.6 BUN 32 Labs (10/29/13) K 4.0 Creatinine 1.6  Labs (4/15) K 4.2 Creatinine 1.32 => 1.5, HCT 35.2 Labs (12/11/13) K 3.6 Creatinine 1.56 Pro BNP 1152 Hemoglobin 9.6  Labs (01/12/14) K 3.0, creatinine 2.0, hemoglobin 8.8 Labs (01/31/14) K 3.3, creatinine 1.49 Labs (8/15) K 3.9, creatinine 1.89 Labs (9/15): K 3.7, creatinine 1.6, AST 48, ALT 52, TSH normal  PMH: 1. CAD: s/p CABG 2004. Last LHC (8/13) with patent free radial-OM, patent SVG-D, patent LIMA-LAD.  2. Permanent atrial fibrillation: Not anticoagulated due to GI bleeding.  Holter monitor (4/15) with mean HR  115 (afib).   He had AV nodal ablation with St Jude dual chamber PPM  3. Chronic diastolic CHF: Echo (1/02) with EF 55-60%, bioprosthetic aortic valve with mean gradient 22 mmHg, mildly decreased RV systolic function. RHC (12/2013): RA 14, RV 44/4/11, PA 50/23 (33), PCWP 20, v = 35, Fick CO/CI: 6.0/2.6, PVR 2.2 WU, PA 59%.  Echo (8/15) with EF 55-60%, mild LVH, bioprosthetic aortic valve with mean gradient 16 mmHg, PA systolic pressure 46 mmHg, mild to moderate MR.  4. Severe aortic stenosis s/p bioprosthetic AVR in 10/13. Mean gradient 22 mmHg across valve on echo in 8/14.  Mean gradient 16 mmHg on echo in 8/15.  5. NASH: ascites, thrombocytopenia.  Had paracentesis in 1/15. Gynecomastia with spironolactone.  6. 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  EGD in 1/15 showed mild portal gastropathy and 2 small antral ulcers. Recurrent GI bleed in 6/15, EGD showed gastritis and ?GAVE.  7. Hyperlipidemia 8. HTN 9. GERD 10. COPD 74. OSA: Cannot tolerate CPAP.  12. CKD 13. Gout  SH: Lives with wife in [place], prior smoker.   FH: CAD  ROS: All systems reviewed and negative except as per HPI.   Current Outpatient Prescriptions on File Prior to Encounter  Medication Sig Dispense Refill  . allopurinol (ZYLOPRIM) 100 MG tablet Take 1 tablet (100 mg total) by mouth daily.  30 tablet  6  . atorvastatin (LIPITOR) 20 MG tablet Take 20 mg  by mouth every morning.       . ferrous gluconate (FERGON) 325 MG tablet Take 325 mg by mouth 2 (two) times daily.       [name] gabapentin (NEURONTIN) 100 MG capsule Take 100 mg by mouth 3 (three) times daily.      . insulin NPH Human (HUMULIN N,NOVOLIN N) 100 UNIT/ML injection Inject 25 Units into the skin 2 (two) times daily before a meal.       . insulin regular (NOVOLIN R,HUMULIN R) 100 units/mL injection Inject 5-12 Units into the skin 3 (three) times daily before meals. Per sliding scale      . losartan (COZAAR) 50 MG tablet Take 25 mg by mouth daily.       . metoprolol  succinate (TOPROL-XL) 100 MG 24 hr tablet Take 100 mg by mouth 2 (two) times daily. Take with or immediately following a meal.      . Multiple Vitamins-Minerals (CVS SPECTRAVITE ADULT 50+ PO) Take 1 tablet by mouth daily.      [name] omeprazole (PRILOSEC) 20 MG capsule Take 1 capsule (20 mg total) by mouth daily.  30 capsule  5  . potassium chloride (K-DUR,KLOR-CON) 10 MEQ tablet Take 60 mEq by mouth 2 (two) times daily.      [name] spironolactone (ALDACTONE) 25 MG tablet Take 0.5 tablets (12.5 mg total) by mouth daily.  15 tablet  3  . torsemide (DEMADEX) 20 MG tablet Take 6 tablets (120 mg total) by mouth 2 (two) times daily.  360 tablet  3  . verapamil (COVERA HS) 240 MG (CO) 24 hr tablet Take 360 mg by mouth daily.       . vitamin C (ASCORBIC ACID) 500 MG tablet Take 500 mg by mouth 2 (two) times daily.       No current facility-administered medications on file prior to encounter.    Filed Vitals:   05/05/14 0859  BP: 98/64  Pulse: 83  Weight: 256 lb 12.8 oz (116.484 kg)  SpO2: 98%    General: NAD Neck: JVP 7, no thyromegaly or thyroid nodule. Wife present  Lungs: Clear to auscultation bilaterally with normal respiratory effort. CV: Nondisplaced PMI.  Tachy, irregular S1/S2, no S3/S4, 2/6 early SEM RUSB.  No peripheral edema.  No carotid bruit.  Normal pedal pulses.  Abdomen: Soft, obese nontender, no hepatosplenomegaly, non distended.  Skin: Intact without lesions or rashes.  Neurologic: Alert and oriented x 3.  Psych: Normal affect. Extremities: No clubbing or cyanosis.   Assessment/Plan: 1. Chronic diastolic CHF:  EF 50-09% (02/2014).  NYHA class II symptoms, no volume overload.  Hopefully he will not have as high a diuretic requirement.   - Will continue torsemide 120 mg BID but will decrease metolazone to once weekly.  Hopefully can stop metolazone in the near future.  - BMET/BNP today 2. Chronic atrial fibrillation:  CHADSVASC 3 - CHF, HTN, DM2. He is not anticoagulated (was on  coumadin in past) due to history of GI bleeding from AVMs. We tried ASA, however it was stopped again due to GIB. HR was very difficult to control, concerned that elevated HR contributes to CHF and exercise intolerance.  He has had AV nodal ablation with dual chamber pacing.  He is feeling much better overall.  Now off amiodarone.   3. CKD stage III: baseline Cr 1.5-2.0. Check BMET today 4. Bioprosthetic AVR: 8/15 echo showed stable bioprosthetic aortic valve.  5. CAD: s/p CABG. No chest pain. Continue statin, BB and ARB. He  has been off aspirin and anticoagulation with history of GI bleeding  6. Cirrhosis: NASH-related. Sees Dr. Laural Golden.  7. Gout: Continues on allopurinol.   Sigismund Cross,MD 05/05/2014

## 2014-05-05 NOTE — Patient Instructions (Signed)
Decrease Metolazone to once a week  Labs today  Your physician recommends that you schedule a follow-up appointment in: 1 month

## 2014-05-06 NOTE — Addendum Note (Signed)
Encounter addended by: Vanessa Barbara, CCT on: 05/06/2014  9:09 AM<BR>     Documentation filed: Charges VN

## 2014-05-07 ENCOUNTER — Encounter: Payer: Self-pay | Admitting: Internal Medicine

## 2014-05-07 DIAGNOSIS — M9903 Segmental and somatic dysfunction of lumbar region: Secondary | ICD-10-CM | POA: Diagnosis not present

## 2014-05-07 DIAGNOSIS — M62830 Muscle spasm of back: Secondary | ICD-10-CM | POA: Diagnosis not present

## 2014-05-08 DIAGNOSIS — M9903 Segmental and somatic dysfunction of lumbar region: Secondary | ICD-10-CM | POA: Diagnosis not present

## 2014-05-08 DIAGNOSIS — M62830 Muscle spasm of back: Secondary | ICD-10-CM | POA: Diagnosis not present

## 2014-05-15 DIAGNOSIS — M1000 Idiopathic gout, unspecified site: Secondary | ICD-10-CM | POA: Diagnosis not present

## 2014-05-15 DIAGNOSIS — Z1389 Encounter for screening for other disorder: Secondary | ICD-10-CM | POA: Diagnosis not present

## 2014-05-18 ENCOUNTER — Other Ambulatory Visit (HOSPITAL_COMMUNITY): Payer: Self-pay | Admitting: Anesthesiology

## 2014-05-23 DIAGNOSIS — E780 Pure hypercholesterolemia: Secondary | ICD-10-CM | POA: Diagnosis not present

## 2014-05-23 DIAGNOSIS — M109 Gout, unspecified: Secondary | ICD-10-CM | POA: Diagnosis not present

## 2014-05-23 DIAGNOSIS — I10 Essential (primary) hypertension: Secondary | ICD-10-CM | POA: Diagnosis not present

## 2014-05-29 ENCOUNTER — Ambulatory Visit (INDEPENDENT_AMBULATORY_CARE_PROVIDER_SITE_OTHER): Payer: Medicare Other | Admitting: *Deleted

## 2014-05-29 DIAGNOSIS — I4891 Unspecified atrial fibrillation: Secondary | ICD-10-CM

## 2014-05-29 LAB — MDC_IDC_ENUM_SESS_TYPE_INCLINIC
Battery Remaining Longevity: 128.4 mo
Battery Voltage: 3.04 V
Date Time Interrogation Session: 20151105134654
Implantable Pulse Generator Model: 1240
Lead Channel Pacing Threshold Amplitude: 0.625 V
Lead Channel Sensing Intrinsic Amplitude: 12 mV
Lead Channel Setting Sensing Sensitivity: 6 mV
MDC IDC MSMT LEADCHNL RV IMPEDANCE VALUE: 762.5 Ohm
MDC IDC MSMT LEADCHNL RV PACING THRESHOLD PULSEWIDTH: 0.4 ms
MDC IDC PG SERIAL: 3014543
MDC IDC SET LEADCHNL RV PACING AMPLITUDE: 0.875
MDC IDC SET LEADCHNL RV PACING PULSEWIDTH: 0.4 ms
MDC IDC STAT BRADY RV PERCENT PACED: 99.81 %

## 2014-05-29 NOTE — Progress Notes (Signed)
Pacemaker check in clinic to lower base rate from 80bpm to 70bpm (N/C). Further testing was not performed this session. No episodes recorded. Patient will follow up with SK on 12-22 @ 11:30.

## 2014-06-05 ENCOUNTER — Ambulatory Visit (HOSPITAL_COMMUNITY)
Admission: RE | Admit: 2014-06-05 | Discharge: 2014-06-05 | Disposition: A | Discharge status: Home / Self Care | Payer: Medicare Other | Source: Ambulatory Visit | Attending: Cardiology | Admitting: Cardiology

## 2014-06-05 VITALS — BP 110/64 | HR 70 | Wt 252.2 lb

## 2014-06-05 DIAGNOSIS — I25812 Atherosclerosis of bypass graft of coronary artery of transplanted heart without angina pectoris: Secondary | ICD-10-CM | POA: Diagnosis not present

## 2014-06-05 DIAGNOSIS — Z8249 Family history of ischemic heart disease and other diseases of the circulatory system: Secondary | ICD-10-CM | POA: Insufficient documentation

## 2014-06-05 DIAGNOSIS — E785 Hyperlipidemia, unspecified: Secondary | ICD-10-CM | POA: Insufficient documentation

## 2014-06-05 DIAGNOSIS — K7581 Nonalcoholic steatohepatitis (NASH): Secondary | ICD-10-CM | POA: Diagnosis not present

## 2014-06-05 DIAGNOSIS — Z952 Presence of prosthetic heart valve: Secondary | ICD-10-CM | POA: Insufficient documentation

## 2014-06-05 DIAGNOSIS — G4733 Obstructive sleep apnea (adult) (pediatric): Secondary | ICD-10-CM | POA: Insufficient documentation

## 2014-06-05 DIAGNOSIS — K219 Gastro-esophageal reflux disease without esophagitis: Secondary | ICD-10-CM | POA: Insufficient documentation

## 2014-06-05 DIAGNOSIS — Z951 Presence of aortocoronary bypass graft: Secondary | ICD-10-CM | POA: Diagnosis not present

## 2014-06-05 DIAGNOSIS — I2510 Atherosclerotic heart disease of native coronary artery without angina pectoris: Secondary | ICD-10-CM | POA: Insufficient documentation

## 2014-06-05 DIAGNOSIS — I129 Hypertensive chronic kidney disease with stage 1 through stage 4 chronic kidney disease, or unspecified chronic kidney disease: Secondary | ICD-10-CM | POA: Diagnosis not present

## 2014-06-05 DIAGNOSIS — M109 Gout, unspecified: Secondary | ICD-10-CM | POA: Insufficient documentation

## 2014-06-05 DIAGNOSIS — I482 Chronic atrial fibrillation: Secondary | ICD-10-CM | POA: Diagnosis not present

## 2014-06-05 DIAGNOSIS — R0989 Other specified symptoms and signs involving the circulatory and respiratory systems: Secondary | ICD-10-CM

## 2014-06-05 DIAGNOSIS — I5032 Chronic diastolic (congestive) heart failure: Secondary | ICD-10-CM

## 2014-06-05 DIAGNOSIS — I350 Nonrheumatic aortic (valve) stenosis: Secondary | ICD-10-CM

## 2014-06-05 DIAGNOSIS — Z79899 Other long term (current) drug therapy: Secondary | ICD-10-CM | POA: Insufficient documentation

## 2014-06-05 DIAGNOSIS — K7460 Unspecified cirrhosis of liver: Secondary | ICD-10-CM | POA: Diagnosis not present

## 2014-06-05 DIAGNOSIS — I5022 Chronic systolic (congestive) heart failure: Secondary | ICD-10-CM

## 2014-06-05 DIAGNOSIS — Z87891 Personal history of nicotine dependence: Secondary | ICD-10-CM | POA: Insufficient documentation

## 2014-06-05 DIAGNOSIS — N183 Chronic kidney disease, stage 3 (moderate): Secondary | ICD-10-CM | POA: Insufficient documentation

## 2014-06-05 MED ORDER — TORSEMIDE 20 MG PO TABS: ORAL_TABLET | ORAL | Status: DC

## 2014-06-05 NOTE — Progress Notes (Signed)
Patient ID: [name], male   DOB: June 23, 1946, 68 y.o.   MRN: [id number] Primary Cardiologist: Dr. Burt [name] GI: Dr [name]- followed for cirrhosis Pulmonary: Dr [name]  [facility]: [name])  Dr. Chalmers [name] (Endocrinologist)  [name] is a 68 yo with history of CAD s/p CABG, permanent atrial fibrillation, chronic diastolic CHF, severe AS s/p bioprosthetic AVR presents for evaluation of diastolic CHF.  Last LHC in 8/13 showed patent grafts.  AVR was in 10/13.  Last echo in 8/14 showed EF 55-60%.    RHC (12/2013): RA 14 RV 44/4/11 PA 50/23 (33) PCWP 20, v = 35 Fick CO/CI: 6.0/2.6 PVR 2.2 WU PA 59%  July 2015:  Admitted to the hospital for GI bleed requiring 2 PRBCs. No additional GI work-up with recent EGD showing gastritis.  Heart rate was very difficult to control in atrial fibrillation, running in the 100s-110s at rest and up to 140s with minimal exertion.  He had an AV nodal ablation in 9/15 and feels much better now.  He still has mild dyspnea with heavy activities but has had considerable improvement.  No problems shopping/walking in stores.  Does fine walking on flat ground.  He was able to walk approximately 500 yards to get to his grandson's football game without problems.  He can climb a flight of steps. Weight is down 4 lbs. At last appointment, I cut back on metolazone. He has had trouble with gout pain in the right ankle and foot.   Labs (8/14) LDL 38 Labs (3/15) K 3.4, creatinine 1.7, BUN 55  Labs (10/15/13) K 4.0 Creatinine 1.6 BUN 32 Labs (10/29/13) K 4.0 Creatinine 1.6  Labs (4/15) K 4.2 Creatinine 1.32 => 1.5, HCT 35.2 Labs (12/11/13) K 3.6 Creatinine 1.56 Pro BNP 1152 Hemoglobin 9.6  Labs (01/12/14) K 3.0, creatinine 2.0, hemoglobin 8.8 Labs (01/31/14) K 3.3, creatinine 1.49 Labs (8/15) K 3.9, creatinine 1.89 Labs (9/15): K 3.7, creatinine 1.6, AST 48, ALT 52, TSH normal Labs (10/15): K 3.5, creatinine 1.58, BNP 780  PMH: 1. CAD: s/p CABG 2004. Last LHC (8/13)  with patent free radial-OM, patent SVG-D, patent LIMA-LAD.  2. Permanent atrial fibrillation: Not anticoagulated due to GI bleeding.  Holter monitor (4/15) with mean HR 115 (afib).   He had AV nodal ablation with St Jude dual chamber PPM  3. Chronic diastolic CHF: Echo (0/27) with EF 55-60%, bioprosthetic aortic valve with mean gradient 22 mmHg, mildly decreased RV systolic function. RHC (12/2013): RA 14, RV 44/4/11, PA 50/23 (33), PCWP 20, v = 35, Fick CO/CI: 6.0/2.6, PVR 2.2 WU, PA 59%.  Echo (8/15) with EF 55-60%, mild LVH, bioprosthetic aortic valve with mean gradient 16 mmHg, PA systolic pressure 46 mmHg, mild to moderate Matthew.  4. Severe aortic stenosis s/p bioprosthetic AVR in 10/13. Mean gradient 22 mmHg across valve on echo in 8/14.  Mean gradient 16 mmHg on echo in 8/15.  5. NASH: ascites, thrombocytopenia.  Had paracentesis in 1/15. Gynecomastia with spironolactone.  6. 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  EGD in 1/15 showed mild portal gastropathy and 2 small antral ulcers. Recurrent GI bleed in 6/15, EGD showed gastritis and ?GAVE.  7. Hyperlipidemia 8. HTN 9. GERD 10. COPD 87. OSA: Cannot tolerate CPAP.  12. CKD 13. Gout  SH: Lives with wife in [place], prior smoker.   FH: CAD  ROS: All systems reviewed and negative except as per HPI.   Current Outpatient Prescriptions on File Prior to Encounter  Medication Sig  Dispense Refill  . allopurinol (ZYLOPRIM) 100 MG tablet Take 1 tablet (100 mg total) by mouth daily. 30 tablet 6  . atorvastatin (LIPITOR) 20 MG tablet Take 20 mg by mouth every morning.     . ferrous gluconate (FERGON) 325 MG tablet Take 325 mg by mouth 2 (two) times daily.     [name] gabapentin (NEURONTIN) 100 MG capsule Take 100 mg by mouth 3 (three) times daily.    . insulin NPH Human (HUMULIN N,NOVOLIN N) 100 UNIT/ML injection Inject 25 Units into the skin 2 (two) times daily before a meal.     . insulin regular (NOVOLIN R,HUMULIN R) 100 units/mL injection Inject 5-12  Units into the skin 3 (three) times daily before meals. Per sliding scale    . KLOR-CON 10 10 MEQ tablet TAKE 6 TABLETS BY MOUTH TWICE A DAY 360 tablet 3  . losartan (COZAAR) 50 MG tablet Take 25 mg by mouth daily.     . metoprolol succinate (TOPROL-XL) 100 MG 24 hr tablet Take 100 mg by mouth 2 (two) times daily. Take with or immediately following a meal.    . Multiple Vitamins-Minerals (CVS SPECTRAVITE ADULT 50+ PO) Take 1 tablet by mouth daily.    [name] omeprazole (PRILOSEC) 20 MG capsule Take 1 capsule (20 mg total) by mouth daily. 30 capsule 5  . spironolactone (ALDACTONE) 25 MG tablet Take 0.5 tablets (12.5 mg total) by mouth daily. 15 tablet 3  . verapamil (COVERA HS) 240 MG (CO) 24 hr tablet Take 360 mg by mouth daily.     . vitamin C (ASCORBIC ACID) 500 MG tablet Take 500 mg by mouth 2 (two) times daily.     No current facility-administered medications on file prior to encounter.    Filed Vitals:   06/05/14 0921  BP: 110/64  Pulse: 70  Weight: 252 lb 4 oz (114.42 kg)  SpO2: 97%    General: NAD Neck: JVP 7, no thyromegaly or thyroid nodule. Wife present  Lungs: Clear to auscultation bilaterally with normal respiratory effort. CV: Nondisplaced PMI.  Tachy, irregular S1/S2, no S3/S4, 2/6 early SEM RUSB.  No peripheral edema.  Left carotid bruit.  Normal pedal pulses.  Abdomen: Soft, obese nontender, no hepatosplenomegaly, non distended.  Skin: Intact without lesions or rashes.  Neurologic: Alert and oriented x 3.  Psych: Normal affect. Extremities: No clubbing or cyanosis.   Assessment/Plan: 1. Chronic diastolic CHF:  EF 27-06% (02/2014).  NYHA class II symptoms, no volume overload.  I think that we can continue to back off on his diuretics.  - Stop metolazone. - Decrease torsemide to 120 qam, 60 qpm.  2. Chronic atrial fibrillation:  CHADSVASC 3 - CHF, HTN, DM2. 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 We tried ASA, however it  was stopped again due to GIB. HR was very difficult to control, concerned that elevated HR contributes to CHF and exercise intolerance.  He has had AV nodal ablation with dual chamber pacing.  He is feeling much better overall.  Now off amiodarone.   3. CKD stage III: Last creatinine in 10/15 at baseline.  4. Bioprosthetic AVR: 8/15 echo showed stable bioprosthetic aortic valve.  5. CAD: s/p CABG. No chest pain. Continue statin, BB and ARB. He has been off aspirin and anticoagulation with history of GI bleeding  6. Cirrhosis: NASH-related. Sees Dr. Laural [name].  7. Gout: Continues on allopurinol. Cutting back on diuretics will likely decrease the risk  of flares.  8. Carotid bruit: I will get carotid dopplers.   Damonte Frieson,MD 06/05/2014

## 2014-06-05 NOTE — Patient Instructions (Signed)
Decrease Torsemide to 6 tabs (120 mg) in AM and only 3 tabs (60 mg) in PM  Stop Metolazone  Your physician has requested that you have cardiac CT. Cardiac computed tomography (CT) is a painless test that uses an x-ray machine to take clear, detailed pictures of your heart. For further information please visit [facility]. Please follow instruction sheet as given.  Your physician recommends that you schedule a follow-up appointment in: 3 months

## 2014-06-06 ENCOUNTER — Ambulatory Visit (HOSPITAL_COMMUNITY): Payer: Medicare Other | Attending: Cardiovascular Disease | Admitting: *Deleted

## 2014-06-06 DIAGNOSIS — E119 Type 2 diabetes mellitus without complications: Secondary | ICD-10-CM | POA: Diagnosis not present

## 2014-06-06 DIAGNOSIS — Z951 Presence of aortocoronary bypass graft: Secondary | ICD-10-CM | POA: Insufficient documentation

## 2014-06-06 DIAGNOSIS — I10 Essential (primary) hypertension: Secondary | ICD-10-CM | POA: Diagnosis not present

## 2014-06-06 DIAGNOSIS — E785 Hyperlipidemia, unspecified: Secondary | ICD-10-CM | POA: Insufficient documentation

## 2014-06-06 DIAGNOSIS — R0989 Other specified symptoms and signs involving the circulatory and respiratory systems: Secondary | ICD-10-CM | POA: Diagnosis not present

## 2014-06-06 DIAGNOSIS — Z87891 Personal history of nicotine dependence: Secondary | ICD-10-CM | POA: Insufficient documentation

## 2014-06-06 DIAGNOSIS — I2510 Atherosclerotic heart disease of native coronary artery without angina pectoris: Secondary | ICD-10-CM | POA: Insufficient documentation

## 2014-06-06 DIAGNOSIS — J449 Chronic obstructive pulmonary disease, unspecified: Secondary | ICD-10-CM | POA: Insufficient documentation

## 2014-06-06 NOTE — Progress Notes (Signed)
Carotid Duplex Performed 

## 2014-06-25 ENCOUNTER — Encounter: Payer: Self-pay | Admitting: Internal Medicine

## 2014-06-26 ENCOUNTER — Encounter (INDEPENDENT_AMBULATORY_CARE_PROVIDER_SITE_OTHER): Payer: Self-pay | Admitting: *Deleted

## 2014-06-28 ENCOUNTER — Other Ambulatory Visit (HOSPITAL_COMMUNITY): Payer: Self-pay | Admitting: Internal Medicine

## 2014-06-28 ENCOUNTER — Other Ambulatory Visit (HOSPITAL_COMMUNITY): Payer: Self-pay | Admitting: Adult Health

## 2014-06-28 NOTE — Telephone Encounter (Signed)
Rx was sent to pharmacy electronically. 

## 2014-07-03 ENCOUNTER — Encounter (HOSPITAL_COMMUNITY): Payer: Self-pay | Admitting: Cardiovascular Disease

## 2014-07-15 ENCOUNTER — Ambulatory Visit (INDEPENDENT_AMBULATORY_CARE_PROVIDER_SITE_OTHER): Payer: Medicare Other | Admitting: Internal Medicine

## 2014-07-15 ENCOUNTER — Encounter: Payer: Self-pay | Admitting: Internal Medicine

## 2014-07-15 VITALS — BP 122/54 | HR 64 | Ht 68.0 in | Wt 266.0 lb

## 2014-07-15 DIAGNOSIS — I495 Sick sinus syndrome: Secondary | ICD-10-CM | POA: Diagnosis not present

## 2014-07-15 DIAGNOSIS — M1000 Idiopathic gout, unspecified site: Secondary | ICD-10-CM

## 2014-07-15 DIAGNOSIS — Z45018 Encounter for adjustment and management of other part of cardiac pacemaker: Secondary | ICD-10-CM

## 2014-07-15 DIAGNOSIS — I25810 Atherosclerosis of coronary artery bypass graft(s) without angina pectoris: Secondary | ICD-10-CM | POA: Diagnosis not present

## 2014-07-15 DIAGNOSIS — I482 Chronic atrial fibrillation, unspecified: Secondary | ICD-10-CM

## 2014-07-15 DIAGNOSIS — N183 Chronic kidney disease, stage 3 unspecified: Secondary | ICD-10-CM

## 2014-07-15 DIAGNOSIS — I442 Atrioventricular block, complete: Secondary | ICD-10-CM | POA: Diagnosis not present

## 2014-07-15 DIAGNOSIS — N2889 Other specified disorders of kidney and ureter: Secondary | ICD-10-CM

## 2014-07-15 LAB — MDC_IDC_ENUM_SESS_TYPE_INCLINIC
Battery Remaining Longevity: 130.8 mo
Battery Voltage: 3.02 V
Implantable Pulse Generator Model: 1240
Implantable Pulse Generator Serial Number: 3014543
Lead Channel Setting Pacing Amplitude: 0.75 V
Lead Channel Setting Pacing Pulse Width: 0.4 ms
Lead Channel Setting Sensing Sensitivity: 6 mV
MDC IDC MSMT LEADCHNL RV IMPEDANCE VALUE: 675 Ohm
MDC IDC MSMT LEADCHNL RV PACING THRESHOLD AMPLITUDE: 0.5 V
MDC IDC MSMT LEADCHNL RV PACING THRESHOLD PULSEWIDTH: 0.4 ms
MDC IDC MSMT LEADCHNL RV SENSING INTR AMPL: 12 mV
MDC IDC SESS DTM: 20151222121008
MDC IDC STAT BRADY RV PERCENT PACED: 99.65 %

## 2014-07-15 LAB — BASIC METABOLIC PANEL
BUN: 20 mg/dL (ref 6–23)
CALCIUM: 9.2 mg/dL (ref 8.4–10.5)
CO2: 25 meq/L (ref 19–32)
Chloride: 101 mEq/L (ref 96–112)
Creatinine, Ser: 1.3 mg/dL (ref 0.4–1.5)
GFR: 58.15 mL/min — ABNORMAL LOW (ref >60.00)
GLUCOSE: 244 mg/dL — AB (ref 70–99)
Potassium: 3.8 mEq/L (ref 3.5–5.1)
SODIUM: 136 meq/L (ref 135–145)

## 2014-07-15 NOTE — Patient Instructions (Addendum)
Your physician recommends that you continue on your current medications as directed. Please refer to the Current Medication list given to you today.  Lab today: BMET  Your physician recommends that you schedule a follow-up appointment in: February 2016 with [facility].  Remote monitoring is used to monitor your pacemaker from home. This monitoring reduces the number of office visits required to check your device to one time per year. It allows [place] to keep an eye on the functioning of your device to ensure it is working properly. You are scheduled for a device check from home on 10-14-2014. You may send your transmission at any time that day. If you have a wireless device, the transmission will be sent automatically. After your physician reviews your transmission, you will receive a postcard with your next transmission date.  Your physician recommends that you schedule a follow-up appointment in: August 2016 with Dr.Klein

## 2014-07-15 NOTE — Progress Notes (Signed)
Patient Care Team: [name], MD as PCP - General (Family Medicine) Rogene Houston, MD (Gastroenterology) Nobie Putnam, MD (Hematology and Oncology) Rounding Lbcardiology, MD as Consulting Physician (Cardiology) Blane Ohara, MD as Consulting Physician (Cardiology)   HPI  [name] is a 68 y.o. male Seen in follow-up for AV junction ablation and pacing undertaken for uncontrolled atrial fibrillation. This is occurring in the context of refractory HFpEF and  prior  bypass surgery undertaken 2004 and catheterization 8/13 demonstrating patent grafts; he is status post AVR.  He has struggled with intermittent gout. He is copious in his salt intake and as well as his fluid intake.  Last laboratories from 10/15 demonstrated stable renal insufficiency and borderline hypokalemia.  Past Medical History  Diagnosis Date  . Overweight(278.02)   . CAD (coronary artery disease)     a. s/p CABG 2004;  b. [place] 10/13:  LHC 8/13: Free radial to obtuse marginal patent, SVG-diagonal patent, LIMA-LAD patent, EF 65-70%, mean aortic valve gradient 42  . Atrial fibrillation     Permanent; off of Coumadin for now due to GI bleed  . DM2 (diabetes mellitus, type 2)     at least 10 yrs  . Insomnia   . Carotid bruit 06/14/2011    a. pre-AVR dopplers 10/13: no sig ICA stenosis  . Hypertension     x 15 yrs  . Chronic diastolic heart failure   . GERD (gastroesophageal reflux disease)   . COPD (chronic obstructive pulmonary disease)   . Iron deficiency anemia     Requiring intravenous iron  . H/O hiatal hernia   . AVM (arteriovenous malformation)     Recurrent GI bleeding requiring multiple transfusions  . Hyperlipidemia   . Thrombocytopenia   . Ascites     status post paracentesis with removal of 3.4 L of ascitic fluid  . Osteoarthritis   . Cirrhosis   . Mediastinal adenopathy 09/22/2011  . Aortic stenosis 03/08/2012    a.  s/p tissue AVR 10/13 with Dr. [name];   b. Echo  10/13: mod LVH, EF 55-60%, tissue AVR not well seen, no leak, gradient not too high (mean 57mmHg), MAC, mild MR, mild LAE, PASP 38  . OSA (obstructive sleep apnea) 1999    DOES NOT USE CPAP    Past Surgical History  Procedure Laterality Date  . Coronary artery bypass graft   10/15/2002     [name]. [name], M.D.     . Carpal tunnel release    10/08/2003  . Lipoma surgery    . Hernia repair    . Other surgical history  08/26/2011    [facility],  enteroscopy , revealing "three-four AVMs."   . Tee without cardioversion  03/07/2012    Procedure: TRANSESOPHAGEAL ECHOCARDIOGRAM (TEE);  Surgeon: Thayer Headings, MD;  Location: [facility] ENDOSCOPY;  Service: Cardiovascular;  Laterality: N/A;  . Coronary angioplasty with stent placement  01/19/2005    drug eluting stent to high grade ostial stenosis of radial artery graft to OM  . Aortic valve replacement  04/25/2012    Procedure: AORTIC VALVE REPLACEMENT (AVR);  Surgeon: Melrose Nakayama, MD;  Location: [place];  Service: Open Heart Surgery;  Laterality: N/A;  . Esophagogastroduodenoscopy N/A 07/31/2013    Procedure: ESOPHAGOGASTRODUODENOSCOPY (EGD);  Surgeon: Milus Banister, MD;  Location: [place];  Service: Endoscopy;  Laterality: N/A;  . Esophagogastroduodenoscopy Left 01/11/2014    Procedure: ESOPHAGOGASTRODUODENOSCOPY (EGD);  Surgeon: Juanita Craver, MD;  Location:  [place] ENDOSCOPY;  Service: Endoscopy;  Laterality: Left;  . Pacemaker insertion  03-24-14    STJ Assurity single chamber pacemaker implanted by Dr Caryl Comes  . Ablation  04-18-14    AVN ablation by Dr Caryl Comes  . Left and right heart catheterization with coronary/graft angiogram N/A 03/07/2012    Procedure: LEFT AND RIGHT HEART CATHETERIZATION WITH Beatrix Fetters;  Surgeon: Sherren Mocha, MD;  Location: [facility] CATH LAB;  Service: Cardiovascular;  Laterality: N/A;  . Right heart catheterization N/A 01/01/2014    Procedure: RIGHT HEART CATH;  Surgeon: Jolaine Artist, MD;  Location: [facility] CATH  LAB;  Service: Cardiovascular;  Laterality: N/A;  . Permanent pacemaker insertion N/A 03/24/2014    Procedure: PERMANENT PACEMAKER INSERTION;  Surgeon: Deboraha Sprang, MD;  Location: [facility] CATH LAB;  Service: Cardiovascular;  Laterality: N/A;  . Av node ablation N/A 04/18/2014    Procedure: AV NODE ABLATION;  Surgeon: Deboraha Sprang, MD;  Location: [facility] CATH LAB;  Service: Cardiovascular;  Laterality: N/A;    Current Outpatient Prescriptions  Medication Sig Dispense Refill  . allopurinol (ZYLOPRIM) 100 MG tablet Take 1 tablet (100 mg total) by mouth daily. 30 tablet 6  . atorvastatin (LIPITOR) 20 MG tablet Take 20 mg by mouth every morning.     . colchicine 0.6 MG tablet Take 0.6 mg by mouth 2 (two) times daily.    . ferrous gluconate (FERGON) 325 MG tablet Take 325 mg by mouth 2 (two) times daily.     [name] gabapentin (NEURONTIN) 100 MG capsule Take 100 mg by mouth 3 (three) times daily.    [name] HYDROcodone-acetaminophen (NORCO/VICODIN) 5-325 MG per tablet Take 1 tablet by mouth every 6 (six) hours as needed. Prn for pain  0  . insulin NPH Human (HUMULIN N,NOVOLIN N) 100 UNIT/ML injection Inject 25 Units into the skin 2 (two) times daily before a meal.     . insulin regular (NOVOLIN R,HUMULIN R) 100 units/mL injection Inject 5-12 Units into the skin 3 (three) times daily before meals. Per sliding scale    . KLOR-CON 10 10 MEQ tablet TAKE 6 TABLETS BY MOUTH TWICE A DAY 360 tablet 3  . losartan (COZAAR) 50 MG tablet Take 25 mg by mouth daily.     . metoprolol succinate (TOPROL-XL) 100 MG 24 hr tablet Take 100 mg by mouth 2 (two) times daily. Take with or immediately following a meal.    . metoprolol succinate (TOPROL-XL) 100 MG 24 hr tablet TAKE 1 TABLET BY MOUTH TWICE DAILY WITH OR IMMEDIATELY FOLLOWING A MEAL 60 tablet 6  . Multiple Vitamins-Minerals ([employer] SPECTRAVITE ADULT 50+ PO) Take 1 tablet by mouth daily.    [name] omeprazole (PRILOSEC) 20 MG capsule Take 1 capsule (20 mg total) by mouth daily. 30 capsule 5   . spironolactone (ALDACTONE) 25 MG tablet TAKE 1/2 TABLET BY MOUTH EVERY DAY 15 tablet 5  . torsemide (DEMADEX) 20 MG tablet Take 6 tabs in AM and 3 tabs in PM 360 tablet 3  . verapamil (COVERA HS) 240 MG (CO) 24 hr tablet Take 360 mg by mouth daily.     . vitamin C (ASCORBIC ACID) 500 MG tablet Take 500 mg by mouth 2 (two) times daily.     No current facility-administered medications for this visit.    Allergies  Allergen Reactions  . Codeine Other (See Comments)    Hurting in chest  . Diltiazem Hcl Itching  . Niacin Other (See Comments)    Hot flashes  Review of Systems negative except from HPI and PMH  Physical Exam BP 122/54 mmHg  Pulse 64  Ht 5\' 8"  (1.727 m)  Wt 266 lb (120.657 kg)  BMI 40.45 kg/m2 Well developed and well nourished in no acute distress HENT normal E scleral and icterus clear Neck Supple JVP flat; carotids brisk and full Clear to ausculation  Regular rate and rhythm, no murmurs gallops or rub Soft with active bowel sounds No clubbing cyanosis 1+ Edema Alert and oriented, grossly normal motor and sensory function Skin Warm and Dry    Assessment and  Plan  Congestive heart failure-chronic-diastolic  AV junction ablation  stable  Atrial fibrillation-permanent  Ischemic Heart Disease prior CABG  Renal insufficiency grade 3  Pacemaker St Jude  The patient's device was interrogated.  The information was reviewed. No changes were made in the programming.      Gout  He is volume overloaded.    The patient's volume status is excessive. Reviewing his diet, I think that decreasing his salt and water intake would likely diminish the amount of diuretics that he would take an might well have a beneficial impact on his gout. This is very exciting to him  There is no clear evidence of ischemia contributing at this point   We will check renal function  And potasssium levels

## 2014-08-06 ENCOUNTER — Encounter: Payer: Self-pay | Admitting: Internal Medicine

## 2014-08-13 ENCOUNTER — Other Ambulatory Visit (HOSPITAL_COMMUNITY): Payer: Self-pay | Admitting: Cardiology

## 2014-08-13 DIAGNOSIS — I5022 Chronic systolic (congestive) heart failure: Secondary | ICD-10-CM

## 2014-08-13 MED ORDER — TORSEMIDE 20 MG PO TABS: ORAL_TABLET | ORAL | Status: DC

## 2014-08-14 ENCOUNTER — Other Ambulatory Visit: Payer: Self-pay | Admitting: *Deleted

## 2014-08-14 DIAGNOSIS — G4730 Sleep apnea, unspecified: Secondary | ICD-10-CM

## 2014-08-18 DIAGNOSIS — I10 Essential (primary) hypertension: Secondary | ICD-10-CM | POA: Diagnosis not present

## 2014-08-18 DIAGNOSIS — J988 Other specified respiratory disorders: Secondary | ICD-10-CM | POA: Diagnosis not present

## 2014-08-18 DIAGNOSIS — N183 Chronic kidney disease, stage 3 (moderate): Secondary | ICD-10-CM | POA: Diagnosis not present

## 2014-08-18 DIAGNOSIS — E785 Hyperlipidemia, unspecified: Secondary | ICD-10-CM | POA: Diagnosis not present

## 2014-08-18 DIAGNOSIS — R944 Abnormal results of kidney function studies: Secondary | ICD-10-CM | POA: Diagnosis not present

## 2014-08-22 DIAGNOSIS — N189 Chronic kidney disease, unspecified: Secondary | ICD-10-CM | POA: Diagnosis not present

## 2014-08-22 DIAGNOSIS — E1165 Type 2 diabetes mellitus with hyperglycemia: Secondary | ICD-10-CM | POA: Diagnosis not present

## 2014-08-22 DIAGNOSIS — I10 Essential (primary) hypertension: Secondary | ICD-10-CM | POA: Diagnosis not present

## 2014-08-22 DIAGNOSIS — E780 Pure hypercholesterolemia: Secondary | ICD-10-CM | POA: Diagnosis not present

## 2014-08-27 ENCOUNTER — Other Ambulatory Visit (HOSPITAL_COMMUNITY): Payer: Self-pay | Admitting: Cardiology

## 2014-09-02 ENCOUNTER — Ambulatory Visit (INDEPENDENT_AMBULATORY_CARE_PROVIDER_SITE_OTHER): Payer: Medicare Other | Admitting: Internal Medicine

## 2014-09-03 ENCOUNTER — Other Ambulatory Visit (HOSPITAL_COMMUNITY): Payer: Self-pay | Admitting: Adult Health

## 2014-09-15 ENCOUNTER — Other Ambulatory Visit (HOSPITAL_COMMUNITY): Payer: Self-pay | Admitting: Cardiology

## 2014-09-15 MED ORDER — POTASSIUM CHLORIDE ER 10 MEQ PO TBCR: 60.0000 meq | EXTENDED_RELEASE_TABLET | Freq: Two times a day (BID) | ORAL | Status: DC

## 2014-09-16 ENCOUNTER — Ambulatory Visit (INDEPENDENT_AMBULATORY_CARE_PROVIDER_SITE_OTHER): Payer: Medicare Other | Admitting: Internal Medicine

## 2014-09-16 ENCOUNTER — Encounter: Payer: Self-pay | Admitting: Internal Medicine

## 2014-09-16 ENCOUNTER — Ambulatory Visit (INDEPENDENT_AMBULATORY_CARE_PROVIDER_SITE_OTHER)
Admission: RE | Admit: 2014-09-16 | Discharge: 2014-09-16 | Disposition: A | Discharge status: Home / Self Care | Payer: Medicare Other | Source: Ambulatory Visit | Attending: Internal Medicine | Admitting: Internal Medicine

## 2014-09-16 VITALS — BP 128/70 | HR 80 | Ht 68.0 in | Wt 275.8 lb

## 2014-09-16 DIAGNOSIS — I10 Essential (primary) hypertension: Secondary | ICD-10-CM | POA: Diagnosis not present

## 2014-09-16 DIAGNOSIS — R059 Cough, unspecified: Secondary | ICD-10-CM

## 2014-09-16 DIAGNOSIS — I509 Heart failure, unspecified: Secondary | ICD-10-CM | POA: Diagnosis not present

## 2014-09-16 DIAGNOSIS — R05 Cough: Secondary | ICD-10-CM

## 2014-09-16 DIAGNOSIS — J439 Emphysema, unspecified: Secondary | ICD-10-CM | POA: Diagnosis not present

## 2014-09-16 NOTE — Patient Instructions (Addendum)
Stop losartan  Increase omeprazole to where you take 2   30-60 min before first meal of the day and Pepcid ac 20 mg after supper until you return  For drainage / throat irritation   take chlortrimeton (chlorpheniramine) 4 mg every 4 hours available over the counter (may cause drowsiness so take at least at bedtime)   For cough >tessalon 200 mg every 4 hours as needed  GERD (REFLUX)  is an extremely common cause of respiratory symptoms just like yours , many times with no obvious heartburn at all.    It can be treated with medication, but also with lifestyle changes including avoidance of late meals, excessive alcohol, smoking cessation, and avoid fatty foods, chocolate, peppermint, colas, red wine, and acidic juices such as orange juice.  NO MINT OR MENTHOL PRODUCTS SO NO COUGH DROPS  USE SUGARLESS CANDY INSTEAD (Jolley ranchers or Stover's or Life Savers) or even ice chips will also do - the key is to swallow to prevent all throat clearing. NO OIL BASED VITAMINS - use powdered substitutes.   Please remember to go to the x-ray department downstairs for your tests - we will call you with the results when they are available. Late add:  Start valsartan 80 mg daily in place of losartan   Please schedule a follow up office visit in 2 weeks, sooner if needed

## 2014-09-16 NOTE — Progress Notes (Signed)
Subjective:    Patient ID: [name], male    DOB: 12-13-1945  MRN: [id number]   Brief patient profile:  27  yowm quit smoking 2002  With no copd on  eval in 2003 referred 06/27/2012  by Dr Olen Pel' PA for cough and  Mod R effusion with a h/o recent heart surgery 04/25/12 AVR and h/o cirrhosis with ascites as well.   History of Present Illness  See admit 10/2-8/13 Redo median sternotomy, aortic valve replacement with 23-mm  [facility] Ease bovine pericardial valve (model number [id number], serial number [id number]) by Dr. [name] on 04/25/2012.   06/27/2012 1st pulmonary eval cc new abrupt onset dry cough/sob x one week seen Dec 2 with abn cxr but has been bothered by fatigue p avr Oct 2, severe cough dry, can't talk, doe  worse since first couple of weeks post op to point only about 50 feet assoc with coughing  rec Stop fish oil and the lisinopril Start Benicar 20 mg one half daily and the cough and breathing should gradually improve over 6 weeks Prilosec Take 30- 60 min before your first and last meals of the day  GERD   R thoracentesis by IR 06/29/2012 > 1.5 yellow fluid, exudate >> wbc 1575 with L > P   07/16/2012 f/u ov/Wert cc breathing better c minimal cough, now able to walk sev hundred feet s sob rec Continue on the benicar 20 one half daily for now       09/16/2014 acute  ov/Wert re: recurrent cough  Chief Complaint  Patient presents with  . Follow-up    c/o cough-dry x 6 wks. worse in evenings,hard to talk b/c of coughing,sob with exertion,midchest pain occass.,wheezing,no fcs,no indigestion,occass. PND  cough onset was abrupt/ like a cold but cough persisted dry in nature cough starts around 4pm and lasts until 4am consistently  No trouble breathing unless coughing   No obvious day to day or daytime variabilty or assoc  cp or chest tightness, subjective wheeze overt sinus or hb symptoms. No unusual exp hx or h/o childhood pna/ asthma or knowledge of premature  birth.  Sleeping ok without nocturnal  or early am exacerbation  of respiratory  c/o's or need for noct saba. Also denies any obvious fluctuation of symptoms with weather or environmental changes or other aggravating or alleviating factors except as outlined above   Current Medications, Allergies, Complete Past Medical History, Past Surgical History, Family History, and Social History were reviewed in [employer] record.  ROS  The following are not active complaints unless bolded sore throat, dysphagia, dental problems, itching, sneezing,  nasal congestion or excess/ purulent secretions, ear ache,   fever, chills, sweats, unintended wt loss, pleuritic or exertional cp, hemoptysis,  orthopnea pnd or leg swelling, presyncope, palpitations, heartburn, abdominal pain, anorexia, nausea, vomiting, diarrhea  or change in bowel or urinary habits, change in stools or urine, dysuria,hematuria,  rash, arthralgias, visual complaints, headache, numbness weakness or ataxia or problems with walking or coordination,  change in mood/affect or memory.                   Objective:   Physical Exam  amb wm with upper airway cough with voice use    07/16/2012  268   > 09/16/2014 276  Wt Readings from Last 3 Encounters:  06/27/12 267 lb (121.11 kg)  06/12/12 262 lb (118.842 kg)  05/22/12 264 lb (119.75 kg)    HEENT: edentulous with dentures/ nl  turbinates, and orophanx. Nl external ear canals without cough reflex   NECK :  without JVD/Nodes/TM/ nl carotid upstrokes bilaterally   LUNGS: no acc muscle use,  Distant bs bilaterally with min dullness R base   CV:  RRR  no s3 or murmur or increase in P2, no edema   ABD:  soft and nontender with nl excursion in the supine position. No bruits or organomegaly, bowel sounds nl  MS:  warm without deformities, calf tenderness, cyanosis or clubbing  SKIN: warm and dry without lesions        CXR PA and Lateral:   09/16/2014 :      I personally reviewed images and agree with radiology impression as follows:    There is a mild degree of congestive heart failure superimposed on emphysematous change. No consolidation.         Assessment & Plan:

## 2014-09-17 ENCOUNTER — Telehealth: Payer: Self-pay | Admitting: Internal Medicine

## 2014-09-17 ENCOUNTER — Encounter: Payer: Self-pay | Admitting: Internal Medicine

## 2014-09-17 MED ORDER — BENZONATATE 200 MG PO CAPS: 200.0000 mg | ORAL_CAPSULE | Freq: Three times a day (TID) | ORAL | Status: DC | PRN

## 2014-09-17 NOTE — Telephone Encounter (Signed)
Spoke with pt, verified that he needed tessalon sent to [facility] [place].  This has been sent in.  Nothing further needed.

## 2014-09-17 NOTE — Assessment & Plan Note (Signed)
For reasons that may related to vascular permability and nitric oxide pathways but not elevated  bradykinin levels (as seen with  ACEi use) losartan in the generic form has been reported now from mulitple sources  to cause a similar pattern of non-specific  upper airway symptoms as seen with acei.   This has not been reported with exposure to the other ARB's to date, so it seems reasonable for now to try either generic diovan or avapro if ARB needed or use an alternative class altogether.  See:  [name] Allergy Asthma Immunol  2008: 101: p [phone]    Will try valsartan 80 mg daily in place of cozar  Also concerned about high dose lopressor > Strongly prefer in this setting: Bystolic, the most beta -1  selective Beta blocker available in sample form, with bisoprolol the most selective generic choice  on the market.  Will consider this at next ov

## 2014-09-17 NOTE — Progress Notes (Signed)
Quick Note:  Spoke with pt and notified of results per Dr. Wert. Pt verbalized understanding and denied any questions.  ______ 

## 2014-09-17 NOTE — Assessment & Plan Note (Signed)
The most common causes of chronic cough in immunocompetent adults include the following: upper airway cough syndrome (UACS), previously referred to as postnasal drip syndrome (PNDS), which is caused by variety of rhinosinus conditions; (2) asthma; (3) GERD; (4) chronic bronchitis from cigarette smoking or other inhaled environmental irritants; (5) nonasthmatic eosinophilic bronchitis; and (6) bronchiectasis.   These conditions, singly or in combination, have accounted for up to 94% of the causes of chronic cough in prospective studies.   Other conditions have constituted no >6% of the causes in prospective studies These have included bronchogenic carcinoma, chronic interstitial pneumonia, sarcoidosis, left ventricular failure, ACEI-induced cough, and aspiration from a condition associated with pharyngeal dysfunction.    Chronic cough is often simultaneously caused by more than one condition. A single cause has been found from 38 to 82% of the time, multiple causes from 18 to 62%. Multiply caused cough has been the result of three diseases up to 42% of the time.       Based on hx and exam, this is most likely:  Recurrent  Upper airway cough syndrome, so named because it's frequently impossible to sort out how much is  CR/sinusitis with freq throat clearing (which can be related to primary GERD)   vs  causing  secondary (" extra esophageal")  GERD from wide swings in gastric pressure that occur with throat clearing, often  promoting self use of mint and menthol lozenges that reduce the lower esophageal sphincter tone and exacerbate the problem further in a cyclical fashion.   These are the same pts (now being labeled as having "irritable larynx syndrome" by some cough centers) who not infrequently have a history of having failed to tolerate ace inhibitors and even now generic losartan (see hbp) ,  dry powder inhalers or biphosphonates or report having atypical reflux symptoms that don't respond to standard  doses of PPI , and are easily confused as having aecopd or asthma flares by even experienced allergists/ pulmonologists.   The first step is to maximize acid suppression and eliminate losartan and cyclical coughing then regroup if the cough persists.  See instructions for specific recommendations which were reviewed directly with the patient who was given a copy with highlighter outlining the key components.

## 2014-09-22 ENCOUNTER — Ambulatory Visit (INDEPENDENT_AMBULATORY_CARE_PROVIDER_SITE_OTHER): Payer: Medicare Other | Admitting: Internal Medicine

## 2014-09-23 ENCOUNTER — Ambulatory Visit (INDEPENDENT_AMBULATORY_CARE_PROVIDER_SITE_OTHER): Payer: Medicare Other | Admitting: Pulmonary Disease

## 2014-09-23 ENCOUNTER — Ambulatory Visit (HOSPITAL_COMMUNITY)
Admission: RE | Admit: 2014-09-23 | Discharge: 2014-09-23 | Disposition: A | Discharge status: Home / Self Care | Payer: Medicare Other | Source: Ambulatory Visit | Attending: Internal Medicine | Admitting: Internal Medicine

## 2014-09-23 ENCOUNTER — Ambulatory Visit (INDEPENDENT_AMBULATORY_CARE_PROVIDER_SITE_OTHER): Payer: Medicare Other | Admitting: Internal Medicine

## 2014-09-23 ENCOUNTER — Encounter (INDEPENDENT_AMBULATORY_CARE_PROVIDER_SITE_OTHER): Payer: Self-pay | Admitting: Internal Medicine

## 2014-09-23 ENCOUNTER — Encounter: Payer: Self-pay | Admitting: Pulmonary Disease

## 2014-09-23 VITALS — BP 130/86 | HR 70 | Temp 98.1°F

## 2014-09-23 VITALS — BP 110/78 | HR 64 | Temp 97.0°F | Resp 18 | Ht 68.0 in | Wt 263.6 lb

## 2014-09-23 DIAGNOSIS — R161 Splenomegaly, not elsewhere classified: Secondary | ICD-10-CM | POA: Diagnosis not present

## 2014-09-23 DIAGNOSIS — R188 Other ascites: Secondary | ICD-10-CM | POA: Insufficient documentation

## 2014-09-23 DIAGNOSIS — K8020 Calculus of gallbladder without cholecystitis without obstruction: Secondary | ICD-10-CM | POA: Diagnosis not present

## 2014-09-23 DIAGNOSIS — K7460 Unspecified cirrhosis of liver: Secondary | ICD-10-CM

## 2014-09-23 DIAGNOSIS — D509 Iron deficiency anemia, unspecified: Secondary | ICD-10-CM | POA: Diagnosis not present

## 2014-09-23 DIAGNOSIS — G4733 Obstructive sleep apnea (adult) (pediatric): Secondary | ICD-10-CM | POA: Diagnosis not present

## 2014-09-23 LAB — HEPATIC FUNCTION PANEL
ALT: 16 U/L (ref 0–53)
AST: 21 U/L (ref 0–37)
Albumin: 4.5 g/dL (ref 3.5–5.2)
Alkaline Phosphatase: 92 U/L (ref 39–117)
BILIRUBIN DIRECT: 0.4 mg/dL — AB (ref 0.0–0.3)
BILIRUBIN TOTAL: 1.2 mg/dL (ref 0.2–1.2)
Indirect Bilirubin: 0.8 mg/dL (ref 0.2–1.2)
Total Protein: 8 g/dL (ref 6.0–8.3)

## 2014-09-23 NOTE — Progress Notes (Signed)
Subjective:    Patient ID: [name], male    DOB: [birth year], 69 y.o.   MRN: [id number]  HPI The patient is a 69 year old male who I've been asked to see for management of obstructive sleep apnea. He tells me that he was diagnosed 10 years ago with severe OSA by a sleep study, but unfortunately could not tolerate C Pap. He has had a recent follow-up study in September of last year that showed a short total sleep time, but was found to have an AHI of 59 events per hour. Basically, any time he was asleep he was having severe apnea. He has been noted to have loud snoring as well as an abnormal breathing pattern during sleep. He has frequent awakenings at night, and is very tired in the mornings upon arising. He has definite inappropriate daytime sleepiness with periods of inactivity during the day, and this can also be an issue for him in the evening. He denies any issues with sleepiness while driving. The patient's weight is down 25 pounds over the last 2 years according to his history, and his Epworth score today is 9.   Sleep Questionnaire What time do you typically go to bed?( Between what hours) 11-12pm 11-12pm at 1514 on 09/23/14 by Glean Hess, CMA How long does it take you to fall asleep? 20 minutes 20 minutes at 1514 on 09/23/14 by Glean Hess, CMA How many times during the night do you wake up? 7 7 at 1514 on 09/23/14 by Glean Hess, CMA What time do you get out of bed to start your day? 0800 0800 at 1514 on 09/23/14 by Glean Hess, CMA Do you drive or operate heavy machinery in your occupation? No No at 1514 on 09/23/14 by Glean Hess, CMA How much has your weight changed (up or down) over the past two years? (In pounds) 25 lb (11.34 kg) 25 lb (11.34 kg) at 1514 on 09/23/14 by Glean Hess, CMA Have you ever had a sleep study before? Yes Yes at 1514 on 09/23/14 by Glean Hess, CMA If yes, location of study? [facility] at 1514 on 09/23/14 by Glean Hess, CMA If yes, date of study? 04/20/2014 04/20/2014 at 1514 on 09/23/14 by Glean Hess, CMA Do you currently use CPAP? No No at 1514 on 09/23/14 by Glean Hess, CMA Do you wear oxygen at any time? No No at 1514 on 09/23/14 by Glean Hess, CMA   Review of Systems  Constitutional: Negative for fever and unexpected weight change.  HENT: Positive for postnasal drip. Negative for congestion, dental problem, ear pain, nosebleeds, rhinorrhea, sinus pressure, sneezing, sore throat and trouble swallowing.   Eyes: Negative for redness and itching.  Respiratory: Positive for cough, shortness of breath and wheezing. Negative for chest tightness.   Cardiovascular: Negative for palpitations and leg swelling.  Gastrointestinal: Negative for nausea and vomiting.  Genitourinary: Negative for dysuria.  Musculoskeletal: Negative for joint swelling.  Skin: Negative for rash.  Neurological: Negative for headaches.  Hematological: Does not bruise/bleed easily.  Psychiatric/Behavioral: Negative for dysphoric mood. The patient is not nervous/anxious.        Objective:   Physical Exam Constitutional:  Obese male, no acute distress  HENT:  Nares patent without discharge  Oropharynx without exudate, palate and uvula are elongated  Eyes:  Perrla, eomi, no scleral icterus  Neck:  No JVD, no TMG  Cardiovascular:  Normal rate, regular rhythm, no rubs  or gallops.  2/6 sem        Intact distal pulses  Pulmonary :  Normal breath sounds, no stridor or respiratory distress   No rales, rhonchi, or wheezing  Abdominal:  Soft, nondistended, bowel sounds present.  No tenderness noted.   Musculoskeletal:  mild lower extremity edema noted.  Lymph Nodes:  No cervical lymphadenopathy noted  Skin:  No cyanosis noted  Neurologic:  Alert, appropriate, moves all 4 extremities without obvious deficit.         Assessment & Plan:

## 2014-09-23 NOTE — Patient Instructions (Signed)
Will start you on cpap with the auto adjusting setting.  Please call if you are having issues with tolerance, and we can work thru it.  Work on weight reduction followup with me again in 8 weeks to check on progress.

## 2014-09-23 NOTE — Patient Instructions (Signed)
Physician will call with results of ultrasound and blood tests when completed.

## 2014-09-23 NOTE — Assessment & Plan Note (Signed)
The patient has severe obstructive sleep apnea on his sleep study from September, and basically has significant sleep disordered breathing any time he falls asleep. I have had a long discussion with him about sleep apnea, including its impact to his quality of life and cardiovascular health. He has significant underlying cardiac issues that can be impacted. I have recommended that he start on C Pap, and because of his issues in the past, would like to try him on the auto setting. If he does not do well, we may need to transition him to bilevel for further comfort. Finally, I have encouraged him to work aggressively on weight loss.

## 2014-09-23 NOTE — Progress Notes (Signed)
Presenting complaint;  Follow-up for iron deficiency anemia and cirrhosis.  Subjective:  Patient is 69 year old Caucasian male who presents for scheduled visit. He was last seen on 04/29/2014. He has gained 11 pounds since his last visit. He says some lower cecotomy was 250 pounds sooner after his last visit. Planes of abdominal distention. She is concerned that fluid has recannulated in his abdomen. Last was in January 2015 when he had 1500 mL removed. He states few days ago he took single dose of metolazone and lost 11 pounds. He denies melena or rectal bleeding. Has good appetite. Bowels move daily or every other day. Stools have been dark since he has been on iron. It is well controlled with therapy and he denies dysphagia. He is at dry hacking cough for 6 weeks. He was seen by Dr. Melvyn Novas in a chest x-ray. He was begun on medications for allergy but he does not feel any better. His wife states he does not do much physical activity other than walking to the mailbox. He does walk some and he goes to [place] which is not very often. He denies orthopnea or paroxysmal nocturnal dyspnea experiences dyspnea on walking one block.   Current Medications: Outpatient Encounter Prescriptions as of 09/23/2014  Medication Sig  . allopurinol (ZYLOPRIM) 100 MG tablet Take 1 tablet (100 mg total) by mouth daily.  [name] atorvastatin (LIPITOR) 20 MG tablet Take 20 mg by mouth every morning.   . benzonatate (TESSALON) 200 MG capsule Take 1 capsule (200 mg total) by mouth 3 (three) times daily as needed for cough.  . chlorpheniramine (CHLOR-TRIMETON) 4 MG tablet Take 4 mg by mouth every 4 (four) hours as needed for allergies.  [name] colchicine 0.6 MG tablet Take 0.6 mg by mouth 2 (two) times daily.  . famotidine (PEPCID AC) 10 MG chewable tablet Chew 10 mg by mouth. Patient states that he takes after evening meal.  . ferrous gluconate (FERGON) 325 MG tablet Take 325 mg by mouth 2 (two) times daily.   [name] gabapentin (NEURONTIN)  100 MG capsule Take 100 mg by mouth 3 (three) times daily.  . insulin NPH Human (HUMULIN N,NOVOLIN N) 100 UNIT/ML injection Inject 20-25 Units into the skin 2 (two) times daily before a meal.   . insulin regular (NOVOLIN R,HUMULIN R) 100 units/mL injection Inject 10-12 Units into the skin 3 (three) times daily before meals. Per sliding scale  . metoprolol succinate (TOPROL-XL) 100 MG 24 hr tablet TAKE 1 TABLET BY MOUTH TWICE DAILY WITH OR IMMEDIATELY FOLLOWING A MEAL  . Multiple Vitamins-Minerals ([employer] SPECTRAVITE ADULT 50+ PO) Take 1 tablet by mouth daily.  [name] omeprazole (PRILOSEC) 20 MG capsule Take 1 capsule (20 mg total) by mouth daily.  . potassium chloride (KLOR-CON 10) 10 MEQ tablet Take 6 tablets (60 mEq total) by mouth 2 (two) times daily.  [name] spironolactone (ALDACTONE) 25 MG tablet TAKE 1/2 TABLET BY MOUTH EVERY DAY  . torsemide (DEMADEX) 20 MG tablet Take 6 tabs in AM and 3 tabs in PM  . verapamil (CALAN-SR) 240 MG CR tablet TAKE 1 & 1/2 TABLETS BY MOUTH AT BEDTIME  . vitamin C (ASCORBIC ACID) 500 MG tablet Take 500 mg by mouth 2 (two) times daily.  . [DISCONTINUED] HYDROcodone-acetaminophen (NORCO/VICODIN) 5-325 MG per tablet Take 1 tablet by mouth every 6 (six) hours as needed. Prn for pain  . [DISCONTINUED] losartan (COZAAR) 50 MG tablet TAKE 1 TABLET (50 MG TOTAL) BY MOUTH DAILY. (Patient not taking: Reported on 09/23/2014)  Objective: Blood pressure 110/78, pulse 64, temperature 97 F (36.1 C), temperature source Oral, resp. rate 18, height 5\' 8"  (1.727 m), weight 263 lb 9.6 oz (119.568 kg). Patient is alert and in no acute distress. Conjunctiva is pink. Sclera is nonicteric Oropharyngeal mucosa is normal. No neck masses or thyromegaly noted. Cardiac exam with regular rhythm normal S1 and loud S2. He has grade 2/6 systolic ejection murmur best heard at left upper sternal border. Lungs are clear to auscultation. Abdomen is obese. On palpation is soft and nontender. Spleen is not  palpable. Liver edge is indistinct below RCM. There is irregularity and midepigastric region which is felt to be an abdominal wall. Shifting dullness is absent.  No LE edema or clubbing noted.  Labs/studies Results: H&H was 11.3 and 36.5 and 04/18/2014  Chest film reviewed . Patient did not have blood work in December as planned.    Assessment:  #1. IDA. No evidence of overt GI bleed. He was noted to have heme positive last year but colonoscopy was delayed because of cardiopulmonary issues. #2. 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 On exam he does not appear to have ascites. 11 pound weight gain does not appear to be due to fluid retention. #3. GERD. Heartburn is well controlled with therapy. #4. Persistent cough. Doubt that cough is secondary to GERD. #4. Obesity. He is not losing weight because of extreme sedentary lifestyle. He may benefit from dietary consultation.   Plan:  Patient will go to the hospital for upper abdominal ultrasound. and lab for CBC, AFP and LFTs. Patient advised to contact his cardiologist's office regarding persistent cough and weight gain. Office visit in 4 months.

## 2014-09-24 LAB — CBC
HCT: 38.3 % — ABNORMAL LOW (ref 39.0–52.0)
HEMOGLOBIN: 12.1 g/dL — AB (ref 13.0–17.0)
MCH: 26.4 pg (ref 26.0–34.0)
MCHC: 31.6 g/dL (ref 30.0–36.0)
MCV: 83.6 fL (ref 78.0–100.0)
Platelets: 133 10*3/uL — ABNORMAL LOW (ref 150–400)
RBC: 4.58 MIL/uL (ref 4.22–5.81)
RDW: 17.3 % — ABNORMAL HIGH (ref 11.5–15.5)
WBC: 6.3 10*3/uL (ref 4.0–10.5)

## 2014-09-24 LAB — AFP TUMOR MARKER: AFP-Tumor Marker: 2.2 ng/mL (ref <6.1)

## 2014-09-30 ENCOUNTER — Ambulatory Visit (INDEPENDENT_AMBULATORY_CARE_PROVIDER_SITE_OTHER): Payer: Medicare Other | Admitting: Internal Medicine

## 2014-09-30 ENCOUNTER — Other Ambulatory Visit (INDEPENDENT_AMBULATORY_CARE_PROVIDER_SITE_OTHER): Payer: Medicare Other

## 2014-09-30 ENCOUNTER — Encounter: Payer: Self-pay | Admitting: Internal Medicine

## 2014-09-30 VITALS — BP 122/60 | HR 70 | Ht 68.0 in | Wt 271.0 lb

## 2014-09-30 DIAGNOSIS — R05 Cough: Secondary | ICD-10-CM | POA: Diagnosis not present

## 2014-09-30 DIAGNOSIS — R059 Cough, unspecified: Secondary | ICD-10-CM

## 2014-09-30 DIAGNOSIS — I10 Essential (primary) hypertension: Secondary | ICD-10-CM

## 2014-09-30 LAB — BASIC METABOLIC PANEL
BUN: 41 mg/dL — ABNORMAL HIGH (ref 6–23)
CALCIUM: 10.1 mg/dL (ref 8.4–10.5)
CHLORIDE: 99 meq/L (ref 96–112)
CO2: 31 meq/L (ref 19–32)
Creatinine, Ser: 1.62 mg/dL — ABNORMAL HIGH (ref 0.40–1.50)
GFR: 45.48 mL/min — AB (ref >60.00)
GLUCOSE: 296 mg/dL — AB (ref 70–99)
Potassium: 3.5 mEq/L (ref 3.5–5.1)
SODIUM: 137 meq/L (ref 135–145)

## 2014-09-30 MED ORDER — VALSARTAN 160 MG PO TABS: 160.0000 mg | ORAL_TABLET | Freq: Every day | ORAL | Status: DC

## 2014-09-30 NOTE — Patient Instructions (Addendum)
Change prilosec 20 mg to Take 30- 60 min before your first and last meals of the day  Change pepcid ac to 20 mg at bedtime  Please schedule a follow up office visit in 6 weeks, call sooner if needed

## 2014-09-30 NOTE — Progress Notes (Signed)
Quick Note:  Spoke with pt and notified of results per Dr. Wert. Pt verbalized understanding and denied any questions.  ______ 

## 2014-09-30 NOTE — Progress Notes (Signed)
Subjective:    Patient ID: [name], male    DOB: Nov 26, 1945  MRN: [id number]   Brief patient profile:  63  yowm quit smoking 2002  With no copd on  eval in 2003 referred 06/27/2012  by Dr Olen Pel' PA for cough and  Mod R effusion with a h/o recent heart surgery 04/25/12 AVR and h/o cirrhosis with ascites as well.   History of Present Illness  See admit 10/2-8/13 Redo median sternotomy, aortic valve replacement with 23-mm  [facility] Ease bovine pericardial valve (model number [id number], serial number [id number]) by Dr. [name] on 04/25/2012.   06/27/2012 1st pulmonary eval cc new abrupt onset dry cough/sob x one week seen Dec 2 with abn cxr but has been bothered by fatigue p avr Oct 2, severe cough dry, can't talk, doe  worse since first couple of weeks post op to point only about 50 feet assoc with coughing  rec Stop fish oil and the lisinopril Start Benicar 20 mg one half daily and the cough and breathing should gradually improve over 6 weeks Prilosec Take 30- 60 min before your first and last meals of the day  GERD   R thoracentesis by IR 06/29/2012 > 1.5 yellow fluid, exudate >> wbc 1575 with L > P   07/16/2012 f/u ov/Anslee Micheletti cc breathing better c minimal cough, now able to walk sev hundred feet s sob rec Continue on the benicar 20 one half daily for now       09/16/2014 acute  ov/Lelend Heinecke re: recurrent cough  Chief Complaint  Patient presents with  . Follow-up    c/o cough-dry x 6 wks. worse in evenings,hard to talk b/c of coughing,sob with exertion,midchest pain occass.,wheezing,no fcs,no indigestion,occass. PND  cough onset was abrupt/ like a cold but cough persisted dry in nature cough starts around 4pm and lasts until 4am consistently  No trouble breathing unless coughing  rec Stop losartan Increase omeprazole to where you take 2   30-60 min before first meal of the day and Pepcid ac 20 mg after supper until you return For drainage / throat irritation   take  chlortrimeton (chlorpheniramine) 4 mg every 4 hours available over the counter (may cause drowsiness so take at least at bedtime)  For cough >tessalon 200 mg every 4 hours as needed GERD   Please remember to go to the x-ray department downstairs for your tests - we will call you with the results when they are available. Late add:  Start valsartan 80 mg daily in place of losartan     09/30/2014 f/u ov/Gerber Penza re: chronic cough since first of Jan 2016  Chief Complaint  Patient presents with  . Follow-up    2 week rov - Cough is 50% better - Occas dry cough, sob about the same - Denies wheezing or chest tightness - Pt's wife reports that pt is a mouth breather and not sure if this hurts anything   Not limited by breathing from desired activities   Cough is sporadic but but mostly dry day > noct   No obvious day to day or daytime variabilty or assoc  cp or chest tightness, subjective wheeze overt sinus or hb symptoms. No unusual exp hx or h/o childhood pna/ asthma or knowledge of premature birth.  Sleeping ok without nocturnal  or early am exacerbation  of respiratory  c/o's or need for noct saba. Also denies any obvious fluctuation of symptoms with weather or environmental changes or other aggravating or alleviating factors  except as outlined above   Current Medications, Allergies, Complete Past Medical History, Past Surgical History, Family History, and Social History were reviewed in [employer] record.  ROS  The following are not active complaints unless bolded sore throat, dysphagia, dental problems, itching, sneezing,  nasal congestion or excess/ purulent secretions, ear ache,   fever, chills, sweats, unintended wt loss, pleuritic or exertional cp, hemoptysis,  orthopnea pnd or leg swelling, presyncope, palpitations, heartburn, abdominal pain, anorexia, nausea, vomiting, diarrhea  or change in bowel or urinary habits, change in stools or urine, dysuria,hematuria,  rash,  arthralgias, visual complaints, headache, numbness weakness or ataxia or problems with walking or coordination,  change in mood/affect or memory.                   Objective:   Physical Exam  amb wm with upper airway cough with voice use    07/16/2012  268   > 09/16/2014 276 > 09/30/2014   271  Wt Readings from Last 3 Encounters:  06/27/12 267 lb (121.11 kg)  06/12/12 262 lb (118.842 kg)  05/22/12 264 lb (119.75 kg)    HEENT: edentulous with dentures/ nl turbinates, and orophanx. Nl external ear canals without cough reflex   NECK :  without JVD/Nodes/TM/ nl carotid upstrokes bilaterally   LUNGS: no acc muscle use,  Distant bs bilaterally with min dullness R base   CV:  RRR  no s3 or murmur or increase in P2, no edema   ABD:  soft and nontender with nl excursion in the supine position. No bruits or organomegaly, bowel sounds nl  MS:  warm without deformities, calf tenderness, cyanosis or clubbing  SKIN: warm and dry without lesions        CXR PA and Lateral:   09/16/2014 :     I personally reviewed images and agree with radiology impression as follows:   There is a mild degree of congestive heart failure superimposed on emphysematous change. No consolidation.      Chemistry      Component Value Date/Time   NA 137 09/30/2014 1134   NA 138 02/04/2013 0949   K 3.5 09/30/2014 1134   K 4.9 02/04/2013 0949   CL 99 09/30/2014 1134   CL 102 08/21/2012 0838   CO2 31 09/30/2014 1134   CO2 27 02/04/2013 0949   BUN 41* 09/30/2014 1134   BUN 12.1 02/04/2013 0949   CREATININE 1.62* 09/30/2014 1134   CREATININE 1.1 02/04/2013 0949   CREATININE 0.96 02/13/2012 1051      Component Value Date/Time   CALCIUM 10.1 09/30/2014 1134   CALCIUM 9.6 02/04/2013 0949   ALKPHOS 92 09/23/2014 1022   ALKPHOS 78 02/04/2013 0949   AST 21 09/23/2014 1022   AST 37* 02/04/2013 0949   ALT 16 09/23/2014 1022   ALT 39 02/04/2013 0949   BILITOT 1.2 09/23/2014 1022   BILITOT 0.94  02/04/2013 0949          Assessment & Plan:

## 2014-10-04 ENCOUNTER — Encounter: Payer: Self-pay | Admitting: Internal Medicine

## 2014-10-04 MED ORDER — OMEPRAZOLE 20 MG PO CPDR: 20.0000 mg | DELAYED_RELEASE_CAPSULE | Freq: Two times a day (BID) | ORAL | Status: DC

## 2014-10-04 MED ORDER — FAMOTIDINE 20 MG PO TABS: ORAL_TABLET | ORAL | Status: DC

## 2014-10-04 NOTE — Assessment & Plan Note (Signed)
Trial off acei started 06/28/2012 due to cough> marked improvement 07/16/2012 ov  - trial off losartan 09/17/2014 same issue   Lab Results  Component Value Date   CREATININE 1.62* 09/30/2014   CREATININE 1.3 07/15/2014   CREATININE 1.58* 05/05/2014   CREATININE 1.1 02/04/2013   CREATININE 0.9 08/21/2012   CREATININE 0.96 02/13/2012   CREATININE 0.90 10/11/2011     Will need recheck creat next ov

## 2014-10-04 NOTE — Assessment & Plan Note (Addendum)
-   Trial off acei started 06/28/2012  -  Recurrent cough around 07/25/14 while on cozar and high dose lopressor   Concerned he could have a component of high dose BB induced cough variant asthma but this is much more likely  Classic Upper airway cough syndrome, so named because it's frequently impossible to sort out how much is  CR/sinusitis with freq throat clearing (which can be related to primary GERD)   vs  causing  secondary (" extra esophageal")  GERD from wide swings in gastric pressure that occur with throat clearing, often  promoting self use of mint and menthol lozenges that reduce the lower esophageal sphincter tone and exacerbate the problem further in a cyclical fashion.   These are the same pts (now being labeled as having "irritable larynx syndrome" by some cough centers) who not infrequently have a history of having failed to tolerate ace inhibitors,  dry powder inhalers or biphosphonates or report having atypical reflux symptoms that don't respond to standard doses of PPI , and are easily confused as having aecopd or asthma flares by even experienced allergists/ pulmonologists.   Will try max gerd rx next and continue to lopressor for now but may need MCT to sort out

## 2014-10-14 ENCOUNTER — Ambulatory Visit (INDEPENDENT_AMBULATORY_CARE_PROVIDER_SITE_OTHER): Payer: Medicare Other | Admitting: *Deleted

## 2014-10-14 DIAGNOSIS — I495 Sick sinus syndrome: Secondary | ICD-10-CM

## 2014-10-14 NOTE — Progress Notes (Signed)
Remote pacemaker transmission.   

## 2014-10-17 LAB — MDC_IDC_ENUM_SESS_TYPE_REMOTE
Battery Remaining Longevity: 136 mo
Battery Remaining Percentage: 95.5 %
Battery Voltage: 3.02 V
Implantable Pulse Generator Model: 1240
Implantable Pulse Generator Serial Number: 3014543
Lead Channel Impedance Value: 690 Ohm
Lead Channel Pacing Threshold Pulse Width: 0.4 ms
Lead Channel Setting Pacing Amplitude: 0.75 V
Lead Channel Setting Pacing Pulse Width: 0.4 ms
MDC IDC MSMT LEADCHNL RV PACING THRESHOLD AMPLITUDE: 0.5 V
MDC IDC MSMT LEADCHNL RV SENSING INTR AMPL: 12 mV
MDC IDC SESS DTM: 20160322060017
MDC IDC SET LEADCHNL RV SENSING SENSITIVITY: 6 mV
MDC IDC STAT BRADY RV PERCENT PACED: 99 %

## 2014-10-29 ENCOUNTER — Encounter: Payer: Self-pay | Admitting: Cardiology

## 2014-11-03 DIAGNOSIS — E109 Type 1 diabetes mellitus without complications: Secondary | ICD-10-CM | POA: Diagnosis not present

## 2014-11-03 DIAGNOSIS — H2513 Age-related nuclear cataract, bilateral: Secondary | ICD-10-CM | POA: Diagnosis not present

## 2014-11-05 ENCOUNTER — Encounter: Payer: Self-pay | Admitting: Internal Medicine

## 2014-11-10 ENCOUNTER — Other Ambulatory Visit (HOSPITAL_COMMUNITY): Payer: Self-pay | Admitting: Cardiology

## 2014-11-11 ENCOUNTER — Ambulatory Visit: Payer: No Typology Code available for payment source | Admitting: Internal Medicine

## 2014-11-13 ENCOUNTER — Encounter: Payer: Self-pay | Admitting: Internal Medicine

## 2014-11-13 ENCOUNTER — Ambulatory Visit (INDEPENDENT_AMBULATORY_CARE_PROVIDER_SITE_OTHER): Payer: Medicare Other | Admitting: Internal Medicine

## 2014-11-13 ENCOUNTER — Ambulatory Visit (INDEPENDENT_AMBULATORY_CARE_PROVIDER_SITE_OTHER)
Admission: RE | Admit: 2014-11-13 | Discharge: 2014-11-13 | Disposition: A | Discharge status: Home / Self Care | Payer: Medicare Other | Source: Ambulatory Visit | Attending: Internal Medicine | Admitting: Internal Medicine

## 2014-11-13 VITALS — BP 136/74 | HR 70 | Ht 68.0 in | Wt 268.8 lb

## 2014-11-13 DIAGNOSIS — R05 Cough: Secondary | ICD-10-CM

## 2014-11-13 DIAGNOSIS — R0600 Dyspnea, unspecified: Secondary | ICD-10-CM | POA: Diagnosis not present

## 2014-11-13 DIAGNOSIS — R059 Cough, unspecified: Secondary | ICD-10-CM

## 2014-11-13 DIAGNOSIS — R0602 Shortness of breath: Secondary | ICD-10-CM | POA: Diagnosis not present

## 2014-11-13 MED ORDER — BENZONATATE 200 MG PO CAPS: 200.0000 mg | ORAL_CAPSULE | Freq: Three times a day (TID) | ORAL | Status: DC | PRN

## 2014-11-13 MED ORDER — IPRATROPIUM BROMIDE 0.06 % NA SOLN: 2.0000 | Freq: Four times a day (QID) | NASAL | Status: DC

## 2014-11-13 NOTE — Progress Notes (Signed)
Subjective:    Patient ID: [name], male    DOB: [birth year]  MRN: [id number]   Brief patient profile:  68  yowm quit smoking 2002   and nl pfts 03/09/12  referred 06/27/2012  by Dr [name]' PA for cough and  Mod R effusion with a h/o recent heart surgery 04/25/12 AVR and h/o cirrhosis with ascites as well.   History of Present Illness  See admit 10/2-8/13 Redo median sternotomy, aortic valve replacement with 23-mm  [facility] Ease bovine pericardial valve (model number [id number], serial number [id number]) by Dr. Roxan [name] on 04/25/2012.   06/27/2012 1st pulmonary eval cc new abrupt onset dry cough/sob x one week seen Dec 2 with abn cxr but has been bothered by fatigue p avr Oct 2, severe cough dry, can't talk, doe  worse since first couple of weeks post op to point only about 50 feet assoc with coughing  rec Stop fish oil and the lisinopril Start Benicar 20 mg one half daily and the cough and breathing should gradually improve over 6 weeks Prilosec Take 30- 60 min before your first and last meals of the day  GERD   R thoracentesis by IR 06/29/2012 > 1.5 yellow fluid, exudate >> wbc 1575 with L > P   07/16/2012 f/u ov/[name] cc breathing better c minimal cough, now able to walk sev hundred feet s sob rec Continue on the benicar 20 one half daily for now       09/16/2014 acute  ov/[name] re: recurrent cough  Chief Complaint  Patient presents with  . Follow-up    c/o cough-dry x 6 wks. worse in evenings,hard to talk b/c of coughing,sob with exertion,midchest pain occass.,wheezing,no fcs,no indigestion,occass. PND  cough onset was abrupt/ like a cold but cough persisted dry in nature x 6 weeks prior to OV   cough starts around 4pm and lasts until 4am consistently  No trouble breathing unless coughing  rec Stop losartan Increase omeprazole to where you take 2   30-60 min before first meal of the day and Pepcid ac 20 mg after supper until you return For drainage / throat irritation    take chlortrimeton (chlorpheniramine) 4 mg every 4 hours available over the counter (may cause drowsiness so take at least at bedtime)  For cough >tessalon 200 mg every 4 hours as needed GERD   Please remember to go to the x-ray department downstairs for your tests - we will call you with the results when they are available. Late add:  Start valsartan 80 mg daily in place of losartan     09/30/2014 f/u ov/[name] re: chronic cough since first of Jan 2016  Chief Complaint  Patient presents with  . Follow-up    2 week rov - Cough is 50% better - Occas dry cough, sob about the same - Denies wheezing or chest tightness - Pt's wife reports that pt is a mouth breather and not sure if this hurts anything  Not limited by breathing from desired activities  Cough is sporadic but but mostly dry day > noct  rec Change prilosec 20 mg to Take 30- 60 min before your first and last meals of the day  Change pepcid ac to 20 mg at bedtime    11/13/2014 f/u ov/[name] re: cough x since Jan 2016 / sob/ drippy nose x your eat esp when eat, did not response   Chief Complaint  Patient presents with  . Follow-up    Pt states his cough  has worsened back to where it was at first visit here. He feels like mucus gets stuck in his throat. He states that his nose runs constantly.   More sob when walking uphill back to housefrom feeding dogs/ cough is worse / onset indolent, ? Better on tessalon/ nose dripping no better on 1st gen H1  On cpap does ok sleeping   No obvious day to day or daytime variabilty or assoc  cp or chest tightness, subjective wheeze overt sinus or hb symptoms. No unusual exp hx or h/o childhood pna/ asthma or knowledge of premature birth.  Sleeping ok without nocturnal  or early am exacerbation  of respiratory  c/o's or need for noct saba. Also denies any obvious fluctuation of symptoms with weather or environmental changes or other aggravating or alleviating factors except as outlined above   Current  Medications, Allergies, Complete Past Medical History, Past Surgical History, Family History, and Social History were reviewed in [employer] record.  ROS  The following are not active complaints unless bolded sore throat, dysphagia, dental problems, itching, sneezing,  nasal congestion or excess/ purulent secretions, ear ache,   fever, chills, sweats, unintended wt loss, pleuritic or exertional cp, hemoptysis,  orthopnea pnd or leg swelling, presyncope, palpitations, heartburn, abdominal pain, anorexia, nausea, vomiting, diarrhea  or change in bowel or urinary habits, change in stools or urine, dysuria,hematuria,  rash, arthralgias, visual complaints, headache, numbness weakness or ataxia or problems with walking or coordination,  change in mood/affect or memory.                   Objective:   Physical Exam  amb wm nad/ no excess throat clearing    07/16/2012  268   > 09/16/2014 276 > 09/30/2014   271 > 11/13/2014  269  Wt Readings from Last 3 Encounters:  06/27/12 267 lb (121.11 kg)  06/12/12 262 lb (118.842 kg)  05/22/12 264 lb (119.75 kg)    HEENT: edentulous with dentures/ nl turbinates s discharge, and orophanx clear s pnd. Nl external ear canals without cough reflex   NECK :  without JVD/Nodes/TM/ nl carotid upstrokes bilaterally   LUNGS: no acc muscle use,  Distant bs bilaterally with min dullness R base   CV:  RRR  no s3 or murmur or increase in P2, no edema   ABD:  Obese/ mod distended / soft and nontender with nl excursion in the supine position. No bruits or organomegaly, bowel sounds nl  MS:  warm without deformities, calf tenderness, cyanosis or clubbing  SKIN: warm and dry without lesions        CXR PA and Lateral:   11/13/2014 :     I personally reviewed images and agree with radiology impression as follows:    1. Cardiac valve replacement. Prior CABG. Cardiac pacer noted with lead tip projected over right ventricle. Stable  cardiomegaly. No CHF. 2. No acute pulmonary infiltrate. 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

## 2014-11-13 NOTE — Assessment & Plan Note (Addendum)
-   Trial off acei started 06/28/2012  -  Recurrent cough around 07/25/14 while on cozar and high dose lopressor  -  No response to 1st gen H1 reported at [facility] 11/13/2014 assoc with watery rhinitis esp at meals> try atovent ns    No need to work up cough further until address the rhinitis issue >  If not responding to atrovent needs sinus ct/ allergy eval next  Doubt cough variant asthma s noct component but note on high dose toprol which could place him at risk of this

## 2014-11-13 NOTE — Progress Notes (Signed)
Quick Note:  Spoke with pt and notified of results per Dr. Wert. Pt verbalized understanding and denied any questions.  ______ 

## 2014-11-13 NOTE — Patient Instructions (Addendum)
Please remember to go to the x-ray department downstairs for your tests - we will call you with the results when they are available.  Tessalon is your best bet for the cough   Try atrovent nasal spray four times daily as needed   Your fluid status is your biggest problem at this point but is being managed by the heart failure clinic so I will contact Dr Rowland Lathe re my concerns   Pulmonary follow up is as needed if you are not happy with the cough after we eliminate your drippy nose and optimize your fluid status but before you return to see I need you to first see See Tammy NP  with all your medications, even over the counter meds, separated in two separate bags, the ones you take no matter what vs the ones you stop once you feel better and take only as needed when you feel you need them.   Tammy  will generate for you a new user friendly medication calendar that will put [place] all on the same page re: your medication use.     Without this process, it simply isn't possible to assure that we are providing  your outpatient care  with  the attention to detail we feel you deserve.   If we cannot assure that you're getting that kind of care,  then we cannot manage your problem effectively from this clinic.  Once you have seen Tammy and we are sure that we're all on the same page with your medication use she will arrange follow up with me.

## 2014-11-13 NOTE — Assessment & Plan Note (Addendum)
-   PFTs 03/09/2012 wnl  - Echo 03/04/14 Normal LV function; biatrial enlargement; bioprosthetic aortic valve with mean gradient of 16 mmHg; no AI; mild to moderate MR and TR; moderately elevated pulmonary pressures; compared to prior study 03/08/13, mean gradient has decreased   He's convinced the problem with his breathing  is related to "fluid buildup" in his abd and does have h/o ascites.  I suspect he could use  more aldactone though reluctant to do this in a complex pt on demadex and K and is supposed to be having these meds adjusted at [facility] clinic he tells me but hasn't been back > asked him to call for f/u and I will send note to Dr Benjamine Mola.

## 2014-11-14 ENCOUNTER — Other Ambulatory Visit (HOSPITAL_COMMUNITY): Payer: Self-pay | Admitting: Cardiology

## 2014-11-17 ENCOUNTER — Telehealth (HOSPITAL_COMMUNITY): Payer: Self-pay | Admitting: Vascular Surgery

## 2014-11-18 ENCOUNTER — Ambulatory Visit (INDEPENDENT_AMBULATORY_CARE_PROVIDER_SITE_OTHER): Payer: Medicare Other | Admitting: Pulmonary Disease

## 2014-11-18 ENCOUNTER — Encounter: Payer: Self-pay | Admitting: Pulmonary Disease

## 2014-11-18 VITALS — BP 124/70 | HR 69 | Temp 98.0°F | Ht 68.0 in | Wt 271.0 lb

## 2014-11-18 DIAGNOSIS — G4733 Obstructive sleep apnea (adult) (pediatric): Secondary | ICD-10-CM

## 2014-11-18 NOTE — Progress Notes (Signed)
   Subjective:    Patient ID: [name], male    DOB: 1945-08-21, 69 y.o.   MRN: [id number]  HPI The patient comes in today for follow-up of his known severe obstructive sleep apnea. Started on C Pap at the last visit with the auto setting, and his download shows good compliance with great control of his AHI. Despite this, the patient is having issues with tolerance, and feels the pressure may be too high. I have shown him the download, and that his pressures are really on the mild end.    Review of Systems  Constitutional: Negative for fever and unexpected weight change.  HENT: Negative for congestion, dental problem, ear pain, nosebleeds, postnasal drip, rhinorrhea, sinus pressure, sneezing, sore throat and trouble swallowing.   Eyes: Negative for redness and itching.  Respiratory: Negative for cough, chest tightness, shortness of breath and wheezing.   Cardiovascular: Negative for palpitations and leg swelling.  Gastrointestinal: Negative for nausea and vomiting.  Genitourinary: Negative for dysuria.  Musculoskeletal: Negative for joint swelling.  Skin: Negative for rash.  Neurological: Negative for headaches.  Hematological: Does not bruise/bleed easily.  Psychiatric/Behavioral: Negative for dysphoric mood. The patient is not nervous/anxious.        Objective:   Physical Exam Obese male in no acute distress Nose without purulence or discharge noted Neck without lymphadenopathy or thyromegaly No skin breakdown or pressure necrosis from the C Pap mask Lower extremities with edema noted, no cyanosis Alert and oriented, moves all 4 extremities.       Assessment & Plan:

## 2014-11-18 NOTE — Patient Instructions (Signed)
Think about staying on cpap and giving this more time to adapt vs. Changing over to bilevel.  Let us know Work on weight loss.  followup again in 57mos, but please call after thinking about the above.

## 2014-11-18 NOTE — Assessment & Plan Note (Signed)
The patient is wearing C Pap compliantly by his download, but does not feel that he is sleeping well with the device. He has excellent control of his AHI, no significant mask leaks, and really is requiring low pressures overall. He continues to have issues with pressure tolerance, and wearing something on his face. At this point, we either need to continue with the C Pap and give this more time for desensitization, versus changing over to bilevel to see if this will be more comfortable for him. I have asked the patient to think about these 2 options and let me know. I have stressed to him the importance of treating his sleep apnea, and it significant impact to his cardiovascular health. I've also encouraged him to work aggressively on weight loss.

## 2014-12-03 ENCOUNTER — Encounter (HOSPITAL_COMMUNITY): Payer: Self-pay

## 2014-12-03 ENCOUNTER — Ambulatory Visit (HOSPITAL_COMMUNITY)
Admission: RE | Admit: 2014-12-03 | Discharge: 2014-12-03 | Disposition: A | Discharge status: Home / Self Care | Payer: Medicare Other | Source: Ambulatory Visit | Attending: Cardiology | Admitting: Cardiology

## 2014-12-03 VITALS — BP 132/78 | HR 69 | Wt 278.5 lb

## 2014-12-03 DIAGNOSIS — J449 Chronic obstructive pulmonary disease, unspecified: Secondary | ICD-10-CM | POA: Insufficient documentation

## 2014-12-03 DIAGNOSIS — I2510 Atherosclerotic heart disease of native coronary artery without angina pectoris: Secondary | ICD-10-CM | POA: Diagnosis not present

## 2014-12-03 DIAGNOSIS — N183 Chronic kidney disease, stage 3 (moderate): Secondary | ICD-10-CM | POA: Insufficient documentation

## 2014-12-03 DIAGNOSIS — Z87891 Personal history of nicotine dependence: Secondary | ICD-10-CM | POA: Insufficient documentation

## 2014-12-03 DIAGNOSIS — K7581 Nonalcoholic steatohepatitis (NASH): Secondary | ICD-10-CM | POA: Insufficient documentation

## 2014-12-03 DIAGNOSIS — Z951 Presence of aortocoronary bypass graft: Secondary | ICD-10-CM

## 2014-12-03 DIAGNOSIS — E119 Type 2 diabetes mellitus without complications: Secondary | ICD-10-CM | POA: Diagnosis not present

## 2014-12-03 DIAGNOSIS — E785 Hyperlipidemia, unspecified: Secondary | ICD-10-CM | POA: Insufficient documentation

## 2014-12-03 DIAGNOSIS — M79669 Pain in unspecified lower leg: Secondary | ICD-10-CM | POA: Diagnosis not present

## 2014-12-03 DIAGNOSIS — I129 Hypertensive chronic kidney disease with stage 1 through stage 4 chronic kidney disease, or unspecified chronic kidney disease: Secondary | ICD-10-CM | POA: Insufficient documentation

## 2014-12-03 DIAGNOSIS — I5032 Chronic diastolic (congestive) heart failure: Secondary | ICD-10-CM | POA: Diagnosis not present

## 2014-12-03 DIAGNOSIS — I739 Peripheral vascular disease, unspecified: Secondary | ICD-10-CM

## 2014-12-03 DIAGNOSIS — M109 Gout, unspecified: Secondary | ICD-10-CM | POA: Insufficient documentation

## 2014-12-03 DIAGNOSIS — I509 Heart failure, unspecified: Secondary | ICD-10-CM | POA: Diagnosis not present

## 2014-12-03 DIAGNOSIS — Z79899 Other long term (current) drug therapy: Secondary | ICD-10-CM | POA: Insufficient documentation

## 2014-12-03 DIAGNOSIS — I482 Chronic atrial fibrillation: Secondary | ICD-10-CM | POA: Insufficient documentation

## 2014-12-03 DIAGNOSIS — Z794 Long term (current) use of insulin: Secondary | ICD-10-CM | POA: Diagnosis not present

## 2014-12-03 DIAGNOSIS — I350 Nonrheumatic aortic (valve) stenosis: Secondary | ICD-10-CM

## 2014-12-03 DIAGNOSIS — Z953 Presence of xenogenic heart valve: Secondary | ICD-10-CM | POA: Insufficient documentation

## 2014-12-03 DIAGNOSIS — I4821 Permanent atrial fibrillation: Secondary | ICD-10-CM

## 2014-12-03 LAB — BASIC METABOLIC PANEL
Anion gap: 13 (ref 5–15)
BUN: 28 mg/dL — ABNORMAL HIGH (ref 6–20)
CO2: 25 mmol/L (ref 22–32)
Calcium: 9.5 mg/dL (ref 8.9–10.3)
Chloride: 97 mmol/L — ABNORMAL LOW (ref 101–111)
Creatinine, Ser: 1.55 mg/dL — ABNORMAL HIGH (ref 0.61–1.24)
GFR calc Af Amer: 52 mL/min — ABNORMAL LOW (ref >60)
GFR calc non Af Amer: 45 mL/min — ABNORMAL LOW (ref >60)
GLUCOSE: 349 mg/dL — AB (ref 70–99)
POTASSIUM: 4.4 mmol/L (ref 3.5–5.1)
SODIUM: 135 mmol/L (ref 135–145)

## 2014-12-03 LAB — LIPID PANEL
CHOL/HDL RATIO: 3.9 ratio
Cholesterol: 122 mg/dL (ref 0–200)
HDL: 31 mg/dL — ABNORMAL LOW (ref >40)
LDL CALC: 50 mg/dL (ref 0–99)
Triglycerides: 204 mg/dL — ABNORMAL HIGH (ref <150)
VLDL: 41 mg/dL — AB (ref 0–40)

## 2014-12-03 LAB — CBC
HEMATOCRIT: 37.8 % — AB (ref 39.0–52.0)
Hemoglobin: 12.1 g/dL — ABNORMAL LOW (ref 13.0–17.0)
MCH: 27.3 pg (ref 26.0–34.0)
MCHC: 32 g/dL (ref 30.0–36.0)
MCV: 85.3 fL (ref 78.0–100.0)
PLATELETS: 97 10*3/uL — AB (ref 150–400)
RBC: 4.43 MIL/uL (ref 4.22–5.81)
RDW: 16.4 % — ABNORMAL HIGH (ref 11.5–15.5)
WBC: 4 10*3/uL (ref 4.0–10.5)

## 2014-12-03 LAB — BRAIN NATRIURETIC PEPTIDE: B Natriuretic Peptide: 134.5 pg/mL — ABNORMAL HIGH (ref 0.0–100.0)

## 2014-12-03 MED ORDER — METOLAZONE 2.5 MG PO TABS: ORAL_TABLET | ORAL | Status: DC

## 2014-12-03 MED ORDER — POTASSIUM CHLORIDE ER 10 MEQ PO TBCR: 60.0000 meq | EXTENDED_RELEASE_TABLET | Freq: Two times a day (BID) | ORAL | Status: DC

## 2014-12-03 NOTE — Progress Notes (Signed)
Patient ID: [name], male   DOB: 01/23/1946, 69 y.o.   MRN: [id number]  GI: Dr Laural Golden- followed for cirrhosis Pulmonary: Dr Melvyn Novas  PCP: Marjean Donna [facility] [place] [facility])  Dr. Chalmers Cater (Endocrinologist)  [name] is a 69 yo with history of CAD s/p CABG, permanent atrial fibrillation, chronic diastolic CHF, severe AS s/p bioprosthetic AVR presents for evaluation of diastolic CHF.  Last LHC in 8/13 showed patent grafts.  AVR was in 10/13.  Last echo in 8/14 showed EF 55-60%.    RHC (12/2013): RA 14 RV 44/4/11 PA 50/23 (33) PCWP 20, v = 35 Fick CO/CI: 6.0/2.6 PVR 2.2 WU PA 59%  July 2015:  Admitted to the hospital for GI bleed requiring 2 PRBCs. No additional GI work-up with recent EGD showing gastritis.  Heart rate was very difficult to control in atrial fibrillation, running in the 100s-110s at rest and up to 140s with minimal exertion.  He had an AV nodal ablation in 9/15 with significant improvement after this.    I have not seen him since 11/15.  In the interim, he has gained 26 lbs.  He is not very active.  He is short of breath if he tries to "walk briskly" for 100 feet.  This is worse now than it was in the fall.  He elevates the head of this bed.  No exertional chest pain.  Activity is limited by back and leg pain.  The pain seems to be most prominent in his calves with ambulation but also has referred pain from back down the back of his legs.  This has been going on for about a year.  No palpitations, syncope.   Labs (8/14) LDL 38 Labs (3/15) K 3.4, creatinine 1.7, BUN 55  Labs (10/15/13) K 4.0 Creatinine 1.6 BUN 32 Labs (10/29/13) K 4.0 Creatinine 1.6  Labs (4/15) K 4.2 Creatinine 1.32 => 1.5, HCT 35.2 Labs (12/11/13) K 3.6 Creatinine 1.56 Pro BNP 1152 Hemoglobin 9.6  Labs (01/12/14) K 3.0, creatinine 2.0, hemoglobin 8.8 Labs (01/31/14) K 3.3, creatinine 1.49 Labs (8/15) K 3.9, creatinine 1.89 Labs (9/15): K 3.7, creatinine 1.6, AST 48, ALT 52, TSH normal Labs (10/15): K 3.5,  creatinine 1.58, BNP 780 Labs (3/16): K 3.5, creatinine 1.6  PMH: 1. CAD: s/p CABG 2004. Last LHC (8/13) with patent free radial-OM, patent SVG-D, patent LIMA-LAD.  2. Permanent atrial fibrillation: Not anticoagulated due to GI bleeding.  Holter monitor (4/15) with mean HR 115 (afib).   He had AV nodal ablation with St Jude dual chamber PPM  3. Chronic diastolic CHF: Echo (9/50) with EF 55-60%, bioprosthetic aortic valve with mean gradient 22 mmHg, mildly decreased RV systolic function. RHC (12/2013): RA 14, RV 44/4/11, PA 50/23 (33), PCWP 20, v = 35, Fick CO/CI: 6.0/2.6, PVR 2.2 WU, PA 59%.  Echo (8/15) with EF 55-60%, mild LVH, bioprosthetic aortic valve with mean gradient 16 mmHg, PA systolic pressure 46 mmHg, mild to moderate MR.  4. Severe aortic stenosis s/p bioprosthetic AVR in 10/13. Mean gradient 22 mmHg across valve on echo in 8/14.  Mean gradient 16 mmHg on echo in 8/15.  5. NASH: ascites, thrombocytopenia.  Had paracentesis in 1/15. Gynecomastia with spironolactone.  6. 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  EGD in 1/15 showed mild portal gastropathy and 2 small antral ulcers. Recurrent GI bleed in 6/15, EGD showed gastritis and ?GAVE.  7. Hyperlipidemia 8. HTN 9. GERD 10. COPD 17. OSA: Cannot tolerate CPAP.  12. CKD 13. Gout  14. Carotid dopplers (11/15) with minimal stenosis.    SH: Lives with wife in [place], prior smoker.   FH: CAD  ROS: All systems reviewed and negative except as per HPI.   Current Outpatient Prescriptions on File Prior to Encounter  Medication Sig Dispense Refill  . allopurinol (ZYLOPRIM) 100 MG tablet TAKE 1 TABLET (100 MG TOTAL) BY MOUTH DAILY. 30 tablet 6  . atorvastatin (LIPITOR) 20 MG tablet Take 20 mg by mouth every morning.     . benzonatate (TESSALON) 200 MG capsule Take 1 capsule (200 mg total) by mouth 3 (three) times daily as needed for cough. 40 capsule 1  . colchicine 0.6 MG tablet Take 0.6 mg by mouth 2 (two) times daily.    . famotidine  (PEPCID) 20 MG tablet One at bedtime (Patient taking differently: One after dinner)    . ferrous gluconate (FERGON) 325 MG tablet Take 325 mg by mouth 2 (two) times daily.     [name] gabapentin (NEURONTIN) 100 MG capsule Take 100 mg by mouth 3 (three) times daily.    . insulin NPH Human (HUMULIN N,NOVOLIN N) 100 UNIT/ML injection Inject 20-25 Units into the skin 2 (two) times daily before a meal.     . insulin regular (NOVOLIN R,HUMULIN R) 100 units/mL injection Inject 10-12 Units into the skin 3 (three) times daily before meals. Per sliding scale    . ipratropium (ATROVENT) 0.06 % nasal spray Place 2 sprays into both nostrils 4 (four) times daily. 15 mL 12  . metoprolol succinate (TOPROL-XL) 100 MG 24 hr tablet TAKE 1 TABLET BY MOUTH TWICE DAILY WITH OR IMMEDIATELY FOLLOWING A MEAL 60 tablet 6  . Multiple Vitamins-Minerals ([employer]S SPECTRAVITE ADULT 50+ PO) Take 1 tablet by mouth daily.    [name] omeprazole (PRILOSEC) 20 MG capsule Take 1 capsule (20 mg total) by mouth 2 (two) times daily before a meal. 30 capsule 5  . spironolactone (ALDACTONE) 25 MG tablet TAKE 1/2 TABLET BY MOUTH EVERY DAY 15 tablet 5  . torsemide (DEMADEX) 20 MG tablet Take 6 tabs in AM and 3 tabs in PM 270 tablet 3  . valsartan (DIOVAN) 160 MG tablet Take 1 tablet (160 mg total) by mouth daily. 30 tablet 11  . verapamil (CALAN-SR) 240 MG CR tablet TAKE 1 & 1/2 TABLETS BY MOUTH AT BEDTIME (Patient taking differently: TAKE 1 & 1/2 TABLETS BY MOUTH in the morning) 45 tablet 6  . vitamin C (ASCORBIC ACID) 500 MG tablet Take 500 mg by mouth 2 (two) times daily.     No current facility-administered medications on file prior to encounter.    Filed Vitals:   12/03/14 0838  BP: 132/78  Pulse: 69  Weight: 278 lb 8 oz (126.327 kg)  SpO2: 99%    General: NAD Neck: JVP 10, no thyromegaly or thyroid nodule. Wife present  Lungs: Clear to auscultation bilaterally with normal respiratory effort. CV: Nondisplaced PMI.  Tachy, irregular S1/S2, no  S3/S4, 2/6 early SEM RUSB.  Trace ankle edema.  Left carotid bruit.  1+ PT pulses bilaterally.  Abdomen: Soft, obese nontender, no hepatosplenomegaly, non distended.  Skin: Intact without lesions or rashes.  Neurologic: Alert and oriented x 3.  Psych: Normal affect. Extremities: No clubbing or cyanosis.   Assessment/Plan: 1. Chronic diastolic CHF:  EF 61-44% (02/2014).  NYHA class III symptoms, volume overload on exam.   - Continue torsemide to 120 qam, 60 qpm.  - I will have him take metolazone 2.5 mg on Thursday and  again on Saturday.  After that, he will take metolazone every Thursday.  He will take extra 40 mEq KCl on metolazone days.  - BMET/BNP today and repeat BMET in 10 days.   - I would like him to be more active: will refer to cardiac rehab.  2. Chronic atrial fibrillation:  CHADSVASC 3 - CHF, HTN, DM2. 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 We tried ASA, however it was stopped again due to GIB. HR was very difficult to control, concerned that elevated HR contributed to CHF and exercise intolerance.  He has had AV nodal ablation with dual chamber pacing.  Now off amiodarone.   3. CKD stage III: BMET today.   4. Bioprosthetic AVR: 8/15 echo showed stable bioprosthetic aortic valve.  5. CAD: s/p CABG. No chest pain. Continue statin, BB and ARB. He has been off aspirin and anticoagulation with history of GI bleeding.  Check lipids today.   6. Cirrhosis: NASH-related. Sees Dr. Laural Golden.  7. Gout: Continues on allopurinol.   8. Leg pain: He gets calf pain with ambulation.  I am able to feel his PT pulses.  I will arrange for ABIs to screen for PAD.   Followup in 1 month.   Natahlia Hoggard,MD 12/03/2014

## 2014-12-03 NOTE — Patient Instructions (Addendum)
START Metolazone 2.5mg  (1 tablet) take 1 tablet Thursday and Saturday before morning torsemide. Then once a week on Thursday.  TAKE extra potassium 40 meq with Metolazone.  LABS in 10 days (bmet)  You have been referred to Cardiac Rehab they will contact you to schedule appointment.  FOLLOW UP 1 MONTH.

## 2014-12-05 ENCOUNTER — Telehealth (HOSPITAL_COMMUNITY): Payer: Self-pay | Admitting: *Deleted

## 2014-12-11 ENCOUNTER — Other Ambulatory Visit (HOSPITAL_COMMUNITY): Payer: Self-pay | Admitting: Internal Medicine

## 2014-12-11 ENCOUNTER — Ambulatory Visit (HOSPITAL_COMMUNITY): Payer: Medicare Other | Attending: Cardiology

## 2014-12-11 DIAGNOSIS — I2510 Atherosclerotic heart disease of native coronary artery without angina pectoris: Secondary | ICD-10-CM | POA: Diagnosis not present

## 2014-12-11 DIAGNOSIS — J449 Chronic obstructive pulmonary disease, unspecified: Secondary | ICD-10-CM | POA: Insufficient documentation

## 2014-12-11 DIAGNOSIS — E785 Hyperlipidemia, unspecified: Secondary | ICD-10-CM | POA: Diagnosis not present

## 2014-12-11 DIAGNOSIS — I739 Peripheral vascular disease, unspecified: Secondary | ICD-10-CM | POA: Diagnosis not present

## 2014-12-11 DIAGNOSIS — Z951 Presence of aortocoronary bypass graft: Secondary | ICD-10-CM | POA: Insufficient documentation

## 2014-12-11 DIAGNOSIS — Z87891 Personal history of nicotine dependence: Secondary | ICD-10-CM | POA: Insufficient documentation

## 2014-12-11 DIAGNOSIS — I10 Essential (primary) hypertension: Secondary | ICD-10-CM | POA: Diagnosis not present

## 2014-12-11 DIAGNOSIS — E119 Type 2 diabetes mellitus without complications: Secondary | ICD-10-CM | POA: Insufficient documentation

## 2014-12-12 ENCOUNTER — Ambulatory Visit (HOSPITAL_COMMUNITY)
Admission: RE | Admit: 2014-12-12 | Discharge: 2014-12-12 | Disposition: A | Discharge status: Home / Self Care | Payer: Medicare Other | Source: Ambulatory Visit | Attending: Internal Medicine | Admitting: Internal Medicine

## 2014-12-12 DIAGNOSIS — I509 Heart failure, unspecified: Secondary | ICD-10-CM | POA: Insufficient documentation

## 2014-12-12 DIAGNOSIS — I5022 Chronic systolic (congestive) heart failure: Secondary | ICD-10-CM

## 2014-12-12 LAB — BASIC METABOLIC PANEL
Anion gap: 11 (ref 5–15)
BUN: 38 mg/dL — ABNORMAL HIGH (ref 6–20)
CO2: 27 mmol/L (ref 22–32)
CREATININE: 1.48 mg/dL — AB (ref 0.61–1.24)
Calcium: 9.7 mg/dL (ref 8.9–10.3)
Chloride: 96 mmol/L — ABNORMAL LOW (ref 101–111)
GFR calc Af Amer: 55 mL/min — ABNORMAL LOW (ref >60)
GFR calc non Af Amer: 48 mL/min — ABNORMAL LOW (ref >60)
Glucose, Bld: 286 mg/dL — ABNORMAL HIGH (ref 65–99)
Potassium: 3.7 mmol/L (ref 3.5–5.1)
Sodium: 134 mmol/L — ABNORMAL LOW (ref 135–145)

## 2014-12-16 ENCOUNTER — Encounter (HOSPITAL_COMMUNITY): Payer: Self-pay | Admitting: *Deleted

## 2014-12-23 ENCOUNTER — Telehealth: Payer: Self-pay | Admitting: Internal Medicine

## 2014-12-23 ENCOUNTER — Other Ambulatory Visit: Payer: Self-pay | Admitting: Internal Medicine

## 2014-12-23 MED ORDER — BENZONATATE 200 MG PO CAPS: 200.0000 mg | ORAL_CAPSULE | Freq: Three times a day (TID) | ORAL | Status: DC | PRN

## 2014-12-23 NOTE — Telephone Encounter (Signed)
Ok to refill 

## 2014-12-23 NOTE — Telephone Encounter (Signed)
Spoke with pt, requesting refill on tessalon perles to be sent to [facility] in [place].  Pt is requesting a call back if this refill is approved.  Dr. Melvyn Novas are you ok with this refill?  Thanks!

## 2014-12-23 NOTE — Telephone Encounter (Signed)
RX sent in. Spouse aware. Nothing further needed

## 2014-12-31 ENCOUNTER — Other Ambulatory Visit (HOSPITAL_COMMUNITY): Payer: Self-pay | Admitting: Internal Medicine

## 2015-01-01 ENCOUNTER — Telehealth (HOSPITAL_COMMUNITY): Payer: Self-pay

## 2015-01-01 NOTE — Telephone Encounter (Signed)
I have called and left a message with [name] to inquire about participation in Pulmonary Rehab per Dr. Josefine Class referral. Will send letter in mail and follow up.

## 2015-01-01 NOTE — Telephone Encounter (Signed)
Open in error

## 2015-01-05 ENCOUNTER — Ambulatory Visit (HOSPITAL_COMMUNITY)
Admission: RE | Admit: 2015-01-05 | Discharge: 2015-01-05 | Disposition: A | Discharge status: Home / Self Care | Payer: Medicare Other | Source: Ambulatory Visit | Attending: Internal Medicine | Admitting: Internal Medicine

## 2015-01-05 ENCOUNTER — Encounter (HOSPITAL_COMMUNITY): Payer: Self-pay

## 2015-01-05 VITALS — BP 108/62 | HR 77 | Wt 276.5 lb

## 2015-01-05 DIAGNOSIS — G4733 Obstructive sleep apnea (adult) (pediatric): Secondary | ICD-10-CM | POA: Insufficient documentation

## 2015-01-05 DIAGNOSIS — J449 Chronic obstructive pulmonary disease, unspecified: Secondary | ICD-10-CM | POA: Insufficient documentation

## 2015-01-05 DIAGNOSIS — I509 Heart failure, unspecified: Secondary | ICD-10-CM | POA: Insufficient documentation

## 2015-01-05 DIAGNOSIS — N183 Chronic kidney disease, stage 3 (moderate): Secondary | ICD-10-CM

## 2015-01-05 DIAGNOSIS — I482 Chronic atrial fibrillation, unspecified: Secondary | ICD-10-CM

## 2015-01-05 DIAGNOSIS — Z79899 Other long term (current) drug therapy: Secondary | ICD-10-CM | POA: Insufficient documentation

## 2015-01-05 DIAGNOSIS — K219 Gastro-esophageal reflux disease without esophagitis: Secondary | ICD-10-CM | POA: Insufficient documentation

## 2015-01-05 DIAGNOSIS — E785 Hyperlipidemia, unspecified: Secondary | ICD-10-CM | POA: Diagnosis not present

## 2015-01-05 DIAGNOSIS — Z953 Presence of xenogenic heart valve: Secondary | ICD-10-CM | POA: Diagnosis not present

## 2015-01-05 DIAGNOSIS — M79606 Pain in leg, unspecified: Secondary | ICD-10-CM | POA: Insufficient documentation

## 2015-01-05 DIAGNOSIS — E119 Type 2 diabetes mellitus without complications: Secondary | ICD-10-CM | POA: Insufficient documentation

## 2015-01-05 DIAGNOSIS — I2510 Atherosclerotic heart disease of native coronary artery without angina pectoris: Secondary | ICD-10-CM | POA: Diagnosis not present

## 2015-01-05 DIAGNOSIS — I129 Hypertensive chronic kidney disease with stage 1 through stage 4 chronic kidney disease, or unspecified chronic kidney disease: Secondary | ICD-10-CM | POA: Insufficient documentation

## 2015-01-05 DIAGNOSIS — Z951 Presence of aortocoronary bypass graft: Secondary | ICD-10-CM | POA: Insufficient documentation

## 2015-01-05 DIAGNOSIS — Z794 Long term (current) use of insulin: Secondary | ICD-10-CM | POA: Diagnosis not present

## 2015-01-05 DIAGNOSIS — K7460 Unspecified cirrhosis of liver: Secondary | ICD-10-CM | POA: Diagnosis not present

## 2015-01-05 DIAGNOSIS — Z8249 Family history of ischemic heart disease and other diseases of the circulatory system: Secondary | ICD-10-CM | POA: Insufficient documentation

## 2015-01-05 DIAGNOSIS — Z87891 Personal history of nicotine dependence: Secondary | ICD-10-CM | POA: Insufficient documentation

## 2015-01-05 DIAGNOSIS — M109 Gout, unspecified: Secondary | ICD-10-CM | POA: Insufficient documentation

## 2015-01-05 DIAGNOSIS — I5032 Chronic diastolic (congestive) heart failure: Secondary | ICD-10-CM

## 2015-01-05 NOTE — Progress Notes (Signed)
Patient ID: [name], male   DOB: [birth year], 69 y.o.   MRN: [id number]  GI: Dr Laural Golden- followed for cirrhosis Pulmonary: Dr Melvyn Novas  PCP: Marjean Donna [facility])  Dr. Chalmers Cater (Endocrinologist)  [name] is a 69 yo with history of CAD s/p CABG, permanent atrial fibrillation, chronic diastolic CHF, severe AS s/p bioprosthetic AVR presents for evaluation of diastolic CHF.  Last LHC in 8/13 showed patent grafts.  AVR was in 10/13.  Last echo in 8/14 showed EF 55-60%.    RHC (12/2013): RA 14 RV 44/4/11 PA 50/23 (33) PCWP 20, v = 35 Fick CO/CI: 6.0/2.6 PVR 2.2 WU PA 59%  July 2015:  Admitted to the hospital for GI bleed requiring 2 PRBCs. No additional GI work-up with recent EGD showing gastritis.  He had an AV nodal ablation in 9/15 with significant improvement after this.    He returns for HF follow up. Last visit torsemide was cut back to 120/60 mg with metolazone on Thursday. Says urine output is ok on metolazone days. Ongoing dyspnea with exertion. Denies PND/Orthopnea. Weight at home 271-276 pounds. Drinking > 2 liters. Eating high salt foods. Also eating lots of water melon.      Labs (8/14) LDL 38 Labs (3/15) K 3.4, creatinine 1.7, BUN 55  Labs (10/15/13) K 4.0 Creatinine 1.6 BUN 32 Labs (10/29/13) K 4.0 Creatinine 1.6  Labs (4/15) K 4.2 Creatinine 1.32 => 1.5, HCT 35.2 Labs (12/11/13) K 3.6 Creatinine 1.56 Pro BNP 1152 Hemoglobin 9.6  Labs (01/12/14) K 3.0, creatinine 2.0, hemoglobin 8.8 Labs (01/31/14) K 3.3, creatinine 1.49 Labs (8/15) K 3.9, creatinine 1.89 Labs (9/15): K 3.7, creatinine 1.6, AST 48, ALT 52, TSH normal Labs (10/15): K 3.5, creatinine 1.58, BNP 780 Labs (3/16): K 3.5, creatinine 1.6 Labs 12/03/2014: K 4.4 Creatinine 1.55 Labs 12/12/2014: K 3.7 Creatinine 1.48   PMH: 1. CAD: s/p CABG 2004. Last LHC (8/13) with patent free radial-OM, patent SVG-D, patent LIMA-LAD.  2. Permanent atrial fibrillation: Not anticoagulated due to GI bleeding.  Holter monitor  (4/15) with mean HR 115 (afib).   He had AV nodal ablation with St Jude dual chamber PPM  3. Chronic diastolic CHF: Echo (4/09) with EF 55-60%, bioprosthetic aortic valve with mean gradient 22 mmHg, mildly decreased RV systolic function. RHC (12/2013): RA 14, RV 44/4/11, PA 50/23 (33), PCWP 20, v = 35, Fick CO/CI: 6.0/2.6, PVR 2.2 WU, PA 59%.  Echo (8/15) with EF 55-60%, mild LVH, bioprosthetic aortic valve with mean gradient 16 mmHg, PA systolic pressure 46 mmHg, mild to moderate MR.  4. Severe aortic stenosis s/p bioprosthetic AVR in 10/13. Mean gradient 22 mmHg across valve on echo in 8/14.  Mean gradient 16 mmHg on echo in 8/15.  5. NASH: ascites, thrombocytopenia.  Had paracentesis in 1/15. Gynecomastia with spironolactone.  6. 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  EGD in 1/15 showed mild portal gastropathy and 2 small antral ulcers. Recurrent GI bleed in 6/15, EGD showed gastritis and ?GAVE.  7. Hyperlipidemia 8. HTN 9. GERD 10. COPD 40. OSA: Cannot tolerate CPAP.  12. CKD 13. Gout 14. Carotid dopplers (11/15) with minimal stenosis.  15. ABI 12/11/2014: normal    SH: Lives with wife in [place], prior smoker.   FH: CAD  ROS: All systems reviewed and negative except as per HPI.   Current Outpatient Prescriptions on File Prior to Encounter  Medication Sig Dispense Refill  . allopurinol (ZYLOPRIM) 100 MG tablet TAKE 1 TABLET (100 MG TOTAL)  BY MOUTH DAILY. 30 tablet 6  . atorvastatin (LIPITOR) 20 MG tablet Take 20 mg by mouth every morning.     . benzonatate (TESSALON) 200 MG capsule Take 1 capsule (200 mg total) by mouth 3 (three) times daily as needed for cough. 40 capsule 1  . colchicine 0.6 MG tablet Take 0.6 mg by mouth 2 (two) times daily.    . famotidine (PEPCID) 20 MG tablet One at bedtime (Patient taking differently: One after dinner)    . ferrous gluconate (FERGON) 325 MG tablet Take 325 mg by mouth 2 (two) times daily.     [name] gabapentin (NEURONTIN) 100 MG capsule Take 100 mg by  mouth 3 (three) times daily.    . insulin NPH Human (HUMULIN N,NOVOLIN N) 100 UNIT/ML injection Inject 20-25 Units into the skin 2 (two) times daily before a meal.     . insulin regular (NOVOLIN R,HUMULIN R) 100 units/mL injection Inject 10-12 Units into the skin 3 (three) times daily before meals. Per sliding scale    . ipratropium (ATROVENT) 0.06 % nasal spray Place 2 sprays into both nostrils 4 (four) times daily. 15 mL 12  . metolazone (ZAROXOLYN) 2.5 MG tablet Take 2.5 mg (1 tablet) Every Thursday. 7 tablet 3  . metoprolol succinate (TOPROL-XL) 100 MG 24 hr tablet TAKE 1 TABLET BY MOUTH TWICE DAILY WITH OR IMMEDIATELY FOLLOWING A MEAL 60 tablet 6  . Multiple Vitamins-Minerals ([employer]S SPECTRAVITE ADULT 50+ PO) Take 1 tablet by mouth daily.    [name] omeprazole (PRILOSEC) 20 MG capsule Take 1 capsule (20 mg total) by mouth 2 (two) times daily before a meal. 30 capsule 5  . potassium chloride (KLOR-CON 10) 10 MEQ tablet Take 6 tablets (60 mEq total) by mouth 2 (two) times daily. Take extra 40 meq with Metolazone. 360 tablet 3  . spironolactone (ALDACTONE) 25 MG tablet TAKE 1/2 TABLET BY MOUTH EVERY DAY 15 tablet 5  . torsemide (DEMADEX) 20 MG tablet TAKE 6 TABLETS IN THE MORNING AND 3 TABLETS IN THE EVENING 270 tablet 3  . valsartan (DIOVAN) 160 MG tablet Take 1 tablet (160 mg total) by mouth daily. 30 tablet 11  . verapamil (CALAN-SR) 240 MG CR tablet TAKE 1 & 1/2 TABLETS BY MOUTH AT BEDTIME (Patient taking differently: TAKE 1 & 1/2 TABLETS BY MOUTH in the morning) 45 tablet 6  . vitamin C (ASCORBIC ACID) 500 MG tablet Take 500 mg by mouth 2 (two) times daily.     No current facility-administered medications on file prior to encounter.    Filed Vitals:   01/05/15 0907  BP: 108/62  Pulse: 77  Weight: 276 lb 8 oz (125.42 kg)  SpO2: 96%    General: NAD. Wife present Neck: JVP 10, no thyromegaly or thyroid nodule.  Lungs: Clear to auscultation bilaterally with normal respiratory effort. CV:  Nondisplaced PMI.  Irregular S1/S2, no S3/S4, 2/6 early SEM RUSB.  Trace ankle edema.  Left carotid bruit.  1+ PT pulses bilaterally.  Abdomen: Soft, obese nontender, no hepatosplenomegaly, distended.  Skin: Intact without lesions or rashes.  Neurologic: Alert and oriented x 3.  Psych: Normal affect. Extremities: No clubbing or cyanosis.   Assessment/Plan: 1. Chronic diastolic CHF:  EF 16-10% (02/2014).  NYHA class III symptoms, volume overload on exam in part due to increased fluid and high salt diet.  - Continue torsemide to 120 qam, 60 qpm.  - Today he will continue metolazone every Thursday and take an extra 2.5 mg metolazone today. Then  he will go back to once a week.   He will take extra 40 mEq KCl on metolazone days.  I stressed the importance of restricting fluid intake to < 2 liters per day and also low salt diet.    2. Chronic atrial fibrillation:  CHADSVASC 3 - CHF, HTN, DM2. 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 We tried ASA, however it was stopped again due to GIB. HR was very difficult to control, concerned that elevated HR contributed to CHF and exercise intolerance.  He has had AV nodal ablation with dual chamber pacing.  Now off amiodarone.  Heart rate better controlled.  3. CKD stage III: Most recent creatinine ok.  4. Bioprosthetic AVR: 8/15 echo showed stable bioprosthetic aortic valve.  5. CAD: s/p CABG. No chest pain. Continue statin, BB and ARB. He has been off aspirin and anticoagulation with history of GI bleeding.  6. Cirrhosis: NASH-related. Sees Dr. Laural Golden.  7. Gout: Continues on allopurinol.   8. Leg pain:  Had ABI May 19th--> Normal.     Follow up in 2 month.   Jolly Carlini, NP-C  01/05/2015

## 2015-01-05 NOTE — Patient Instructions (Signed)
Take an extra Metolazone today.  Follow up 2 months.  Do the following things EVERYDAY: 1) Weigh yourself in the morning before breakfast. Write it down and keep it in a log. 2) Take your medicines as prescribed 3) Eat low salt foods-Limit salt (sodium) to 2000 mg per day.  4) Stay as active as you can everyday 5) Limit all fluids for the day to less than 2 liters

## 2015-01-08 ENCOUNTER — Telehealth (HOSPITAL_COMMUNITY): Payer: Self-pay | Admitting: *Deleted

## 2015-01-13 NOTE — Patient Outreach (Signed)
[facility]) Care Management  01/13/2015  ANTE [name] May 06, 1946 [id number]   Referral from Onycha, Deloria Lair, NP to outreach.  Ronnell Freshwater. [place], [place] Management [place] Assistant Phone: [phone] Fax: [phone]

## 2015-01-14 ENCOUNTER — Telehealth: Payer: Self-pay | Admitting: *Deleted

## 2015-01-14 NOTE — Patient Outreach (Signed)
Screening done for [place]. Mr. Sherrow told me he thought it sounds like a good thing but it is not a good time for his family right now, his daughter is living with them and has cancer and Hospice is involved.   I expressed my sorrow to learn this family situation and told him I would keep them in my prayers.  I asked if I could call him again in a month or so to follow up and he has agreed. I will call him the first week of August.  Frank Pilger [facility] [place] [phone]

## 2015-01-15 ENCOUNTER — Ambulatory Visit (INDEPENDENT_AMBULATORY_CARE_PROVIDER_SITE_OTHER): Payer: Medicare Other | Admitting: *Deleted

## 2015-01-15 DIAGNOSIS — I495 Sick sinus syndrome: Secondary | ICD-10-CM | POA: Diagnosis not present

## 2015-01-15 NOTE — Progress Notes (Signed)
Remote pacemaker transmission.   

## 2015-01-18 LAB — CUP PACEART REMOTE DEVICE CHECK
Battery Remaining Percentage: 95.5 %
Brady Statistic RV Percent Paced: 99 %
Lead Channel Impedance Value: 710 Ohm
Lead Channel Pacing Threshold Amplitude: 0.5 V
Lead Channel Pacing Threshold Pulse Width: 0.4 ms
Lead Channel Sensing Intrinsic Amplitude: 12 mV
Lead Channel Setting Sensing Sensitivity: 6 mV
MDC IDC MSMT BATTERY REMAINING LONGEVITY: 133 mo
MDC IDC MSMT BATTERY VOLTAGE: 3.02 V
MDC IDC PG SERIAL: 3014543
MDC IDC SESS DTM: 20160623060015
MDC IDC SET LEADCHNL RV PACING AMPLITUDE: 0.75 V
MDC IDC SET LEADCHNL RV PACING PULSEWIDTH: 0.4 ms
Pulse Gen Model: 1240

## 2015-01-27 ENCOUNTER — Encounter (INDEPENDENT_AMBULATORY_CARE_PROVIDER_SITE_OTHER): Payer: Self-pay | Admitting: Internal Medicine

## 2015-01-27 ENCOUNTER — Ambulatory Visit (INDEPENDENT_AMBULATORY_CARE_PROVIDER_SITE_OTHER): Payer: Medicare Other | Admitting: Internal Medicine

## 2015-01-27 VITALS — BP 110/70 | HR 64 | Temp 97.5°F | Resp 18 | Ht 68.0 in | Wt 277.8 lb

## 2015-01-27 DIAGNOSIS — K7460 Unspecified cirrhosis of liver: Secondary | ICD-10-CM

## 2015-01-27 DIAGNOSIS — D509 Iron deficiency anemia, unspecified: Secondary | ICD-10-CM | POA: Diagnosis not present

## 2015-01-27 DIAGNOSIS — R188 Other ascites: Secondary | ICD-10-CM

## 2015-01-27 NOTE — Progress Notes (Signed)
Presenting complaint;  Follow-up for chronic liver disease and iron deficiency anemia.  Subjective:  Patient is 69 year old Caucasian male who is here for scheduled visit. He was last seen on 09/23/2014. He is not feeling well. His daughter was recently placed under hospice care for end-stage endometrial carcinoma which was diagnosed in 2009. She is 67 years old. Patient's wife states that he has very good appetite and he eats too much. He has noted abdominal distention but denies abdominal pain. Heartburns well controlled. He denies nausea vomiting dysphagia melena or rectal bleeding. According to his wife he has not been confused. He denies dyspnea at rest but becomes short of breath with minimal activity.   Current Medications: Outpatient Encounter Prescriptions as of 01/27/2015  Medication Sig  . allopurinol (ZYLOPRIM) 100 MG tablet TAKE 1 TABLET (100 MG TOTAL) BY MOUTH DAILY.  [name] atorvastatin (LIPITOR) 20 MG tablet Take 20 mg by mouth every morning.   . benzonatate (TESSALON) 200 MG capsule Take 1 capsule (200 mg total) by mouth 3 (three) times daily as needed for cough.  . colchicine 0.6 MG tablet Take 0.6 mg by mouth 2 (two) times daily.  . famotidine (PEPCID) 20 MG tablet One at bedtime (Patient taking differently: One after dinner)  . ferrous gluconate (FERGON) 325 MG tablet Take 325 mg by mouth 2 (two) times daily.   [name] gabapentin (NEURONTIN) 300 MG capsule Take 300 mg by mouth 3 (three) times daily.  . insulin NPH Human (HUMULIN N,NOVOLIN N) 100 UNIT/ML injection Inject 20-25 Units into the skin 2 (two) times daily before a meal.   . insulin regular (NOVOLIN R,HUMULIN R) 100 units/mL injection Inject 10-12 Units into the skin 3 (three) times daily before meals. Per sliding scale  . ipratropium (ATROVENT) 0.06 % nasal spray Place 2 sprays into both nostrils 4 (four) times daily.  . metolazone (ZAROXOLYN) 2.5 MG tablet Take 2.5 mg (1 tablet) Every Thursday.  . metoprolol succinate  (TOPROL-XL) 100 MG 24 hr tablet TAKE 1 TABLET BY MOUTH TWICE DAILY WITH OR IMMEDIATELY FOLLOWING A MEAL  . Multiple Vitamins-Minerals ([employer] SPECTRAVITE ADULT 50+ PO) Take 1 tablet by mouth daily.  [name] omeprazole (PRILOSEC) 20 MG capsule Take 1 capsule (20 mg total) by mouth 2 (two) times daily before a meal.  . potassium chloride (KLOR-CON 10) 10 MEQ tablet Take 6 tablets (60 mEq total) by mouth 2 (two) times daily. Take extra 40 meq with Metolazone.  [name] spironolactone (ALDACTONE) 25 MG tablet TAKE 1/2 TABLET BY MOUTH EVERY DAY  . torsemide (DEMADEX) 20 MG tablet TAKE 6 TABLETS IN THE MORNING AND 3 TABLETS IN THE EVENING  . valsartan (DIOVAN) 160 MG tablet Take 1 tablet (160 mg total) by mouth daily.  . verapamil (CALAN-SR) 240 MG CR tablet TAKE 1 & 1/2 TABLETS BY MOUTH AT BEDTIME (Patient taking differently: TAKE 1 & 1/2 TABLETS BY MOUTH in the morning)  . vitamin C (ASCORBIC ACID) 500 MG tablet Take 500 mg by mouth 2 (two) times daily.  . [DISCONTINUED] gabapentin (NEURONTIN) 100 MG capsule Take 100 mg by mouth 3 (three) times daily.   No facility-administered encounter medications on file as of 01/27/2015.     Objective: Blood pressure 110/70, pulse 64, temperature 97.5 F (36.4 C), temperature source Oral, resp. rate 18, height 5\' 8"  (1.727 m), weight 277 lb 12.8 oz (126.009 kg). Patient is alert and in no acute distress. Asterixis absent. Conjunctiva is pink. Sclera is nonicteric Oropharyngeal mucosa is normal. No neck masses or  thyromegaly noted. Cardiac exam with regular rhythm normal S1 and loud S2. Grade 2/6 systolic ejection murmur heard at left sternal border as well as aortic area. Lungs are clear to auscultation. Abdomen is obese and without tenderness. Shifting dullness is absent but flanks are full.  No LE edema or clubbing noted.  Labs/studies Results: Lab data from 12/03/2014  WBC 4.0, H&H 12.1 and 37.8 and platelet count 97K     Assessment:  #1. 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 Patient has gained 14 pounds since his last visit 4 months ago. He does not have peripheral edema. Suspect weight gain is secondary to reaccumulation of ascites which does not appear to be tense on examination and real weight gain secondary to dietary indiscretion. Long-term prognosis as for his liver disease is concerned is not encouraging given continued weight gain. #2. History of iron deficiency anemia. H&H remained stable. No evidence of melena or rectal bleeding. He will need colonoscopy at some point.   Plan:  Ultrasound-guided abdominal paracentesis if he is found to have significant ascites. Patient needs to watch his calorie and salt intake. Office visit in 4 months.

## 2015-01-27 NOTE — Patient Instructions (Signed)
US guided abdominal paracentesis to be scheduled.

## 2015-01-28 ENCOUNTER — Other Ambulatory Visit (INDEPENDENT_AMBULATORY_CARE_PROVIDER_SITE_OTHER): Payer: Self-pay | Admitting: Internal Medicine

## 2015-01-28 DIAGNOSIS — R188 Other ascites: Secondary | ICD-10-CM

## 2015-01-28 DIAGNOSIS — K7469 Other cirrhosis of liver: Secondary | ICD-10-CM

## 2015-01-30 ENCOUNTER — Encounter: Payer: Self-pay | Admitting: Cardiology

## 2015-01-30 ENCOUNTER — Ambulatory Visit (HOSPITAL_COMMUNITY): Payer: Medicare Other

## 2015-01-30 ENCOUNTER — Other Ambulatory Visit (INDEPENDENT_AMBULATORY_CARE_PROVIDER_SITE_OTHER): Payer: Self-pay | Admitting: Internal Medicine

## 2015-01-30 ENCOUNTER — Ambulatory Visit (HOSPITAL_COMMUNITY)
Admission: RE | Admit: 2015-01-30 | Discharge: 2015-01-30 | Disposition: A | Discharge status: Home / Self Care | Payer: Medicare Other | Source: Ambulatory Visit | Attending: Internal Medicine | Admitting: Internal Medicine

## 2015-01-30 DIAGNOSIS — R188 Other ascites: Secondary | ICD-10-CM

## 2015-01-30 DIAGNOSIS — K7460 Unspecified cirrhosis of liver: Secondary | ICD-10-CM | POA: Insufficient documentation

## 2015-01-30 DIAGNOSIS — K7469 Other cirrhosis of liver: Secondary | ICD-10-CM

## 2015-02-05 ENCOUNTER — Encounter: Payer: Self-pay | Admitting: Internal Medicine

## 2015-02-06 DIAGNOSIS — K7460 Unspecified cirrhosis of liver: Secondary | ICD-10-CM | POA: Diagnosis not present

## 2015-02-06 DIAGNOSIS — E1122 Type 2 diabetes mellitus with diabetic chronic kidney disease: Secondary | ICD-10-CM | POA: Diagnosis not present

## 2015-02-06 DIAGNOSIS — D509 Iron deficiency anemia, unspecified: Secondary | ICD-10-CM | POA: Diagnosis not present

## 2015-02-06 DIAGNOSIS — E780 Pure hypercholesterolemia: Secondary | ICD-10-CM | POA: Diagnosis not present

## 2015-02-06 DIAGNOSIS — D696 Thrombocytopenia, unspecified: Secondary | ICD-10-CM | POA: Diagnosis not present

## 2015-02-06 DIAGNOSIS — I482 Chronic atrial fibrillation: Secondary | ICD-10-CM | POA: Diagnosis not present

## 2015-02-06 DIAGNOSIS — I350 Nonrheumatic aortic (valve) stenosis: Secondary | ICD-10-CM | POA: Diagnosis not present

## 2015-02-06 DIAGNOSIS — I5032 Chronic diastolic (congestive) heart failure: Secondary | ICD-10-CM | POA: Diagnosis not present

## 2015-02-06 DIAGNOSIS — Z23 Encounter for immunization: Secondary | ICD-10-CM | POA: Diagnosis not present

## 2015-02-06 DIAGNOSIS — I2510 Atherosclerotic heart disease of native coronary artery without angina pectoris: Secondary | ICD-10-CM | POA: Diagnosis not present

## 2015-02-06 DIAGNOSIS — Z1389 Encounter for screening for other disorder: Secondary | ICD-10-CM | POA: Diagnosis not present

## 2015-02-06 DIAGNOSIS — I10 Essential (primary) hypertension: Secondary | ICD-10-CM | POA: Diagnosis not present

## 2015-02-10 ENCOUNTER — Other Ambulatory Visit (HOSPITAL_COMMUNITY): Payer: Self-pay | Admitting: Adult Health

## 2015-02-24 DIAGNOSIS — E780 Pure hypercholesterolemia: Secondary | ICD-10-CM | POA: Diagnosis not present

## 2015-02-24 DIAGNOSIS — E1165 Type 2 diabetes mellitus with hyperglycemia: Secondary | ICD-10-CM | POA: Diagnosis not present

## 2015-02-24 DIAGNOSIS — N189 Chronic kidney disease, unspecified: Secondary | ICD-10-CM | POA: Diagnosis not present

## 2015-02-24 DIAGNOSIS — I10 Essential (primary) hypertension: Secondary | ICD-10-CM | POA: Diagnosis not present

## 2015-03-18 DIAGNOSIS — I509 Heart failure, unspecified: Secondary | ICD-10-CM | POA: Diagnosis not present

## 2015-03-18 DIAGNOSIS — F329 Major depressive disorder, single episode, unspecified: Secondary | ICD-10-CM | POA: Diagnosis not present

## 2015-03-19 ENCOUNTER — Ambulatory Visit (HOSPITAL_COMMUNITY)
Admission: RE | Admit: 2015-03-19 | Discharge: 2015-03-19 | Disposition: A | Discharge status: Home / Self Care | Payer: Medicare Other | Source: Ambulatory Visit | Attending: Internal Medicine | Admitting: Internal Medicine

## 2015-03-19 ENCOUNTER — Other Ambulatory Visit (HOSPITAL_COMMUNITY): Payer: Self-pay | Admitting: Adult Health

## 2015-03-19 ENCOUNTER — Inpatient Hospital Stay (HOSPITAL_COMMUNITY)
Admission: AD | Admit: 2015-03-19 | Discharge: 2015-03-22 | DRG: 293 | Disposition: A | Discharge status: Home / Self Care | Payer: Medicare Other | Source: Ambulatory Visit | Attending: Cardiology | Admitting: Cardiology

## 2015-03-19 ENCOUNTER — Telehealth (HOSPITAL_COMMUNITY): Payer: Self-pay | Admitting: *Deleted

## 2015-03-19 VITALS — BP 116/58 | HR 85 | Wt 294.0 lb

## 2015-03-19 DIAGNOSIS — G4733 Obstructive sleep apnea (adult) (pediatric): Secondary | ICD-10-CM | POA: Diagnosis present

## 2015-03-19 DIAGNOSIS — I2510 Atherosclerotic heart disease of native coronary artery without angina pectoris: Secondary | ICD-10-CM | POA: Diagnosis present

## 2015-03-19 DIAGNOSIS — K7460 Unspecified cirrhosis of liver: Secondary | ICD-10-CM | POA: Diagnosis present

## 2015-03-19 DIAGNOSIS — J449 Chronic obstructive pulmonary disease, unspecified: Secondary | ICD-10-CM | POA: Diagnosis present

## 2015-03-19 DIAGNOSIS — M109 Gout, unspecified: Secondary | ICD-10-CM | POA: Diagnosis present

## 2015-03-19 DIAGNOSIS — I5033 Acute on chronic diastolic (congestive) heart failure: Secondary | ICD-10-CM | POA: Diagnosis present

## 2015-03-19 DIAGNOSIS — Z952 Presence of prosthetic heart valve: Secondary | ICD-10-CM | POA: Diagnosis not present

## 2015-03-19 DIAGNOSIS — Z79899 Other long term (current) drug therapy: Secondary | ICD-10-CM | POA: Diagnosis not present

## 2015-03-19 DIAGNOSIS — E785 Hyperlipidemia, unspecified: Secondary | ICD-10-CM | POA: Diagnosis present

## 2015-03-19 DIAGNOSIS — I509 Heart failure, unspecified: Secondary | ICD-10-CM | POA: Diagnosis not present

## 2015-03-19 DIAGNOSIS — Z951 Presence of aortocoronary bypass graft: Secondary | ICD-10-CM

## 2015-03-19 DIAGNOSIS — I5032 Chronic diastolic (congestive) heart failure: Secondary | ICD-10-CM

## 2015-03-19 DIAGNOSIS — R188 Other ascites: Secondary | ICD-10-CM

## 2015-03-19 DIAGNOSIS — N183 Chronic kidney disease, stage 3 (moderate): Secondary | ICD-10-CM | POA: Diagnosis not present

## 2015-03-19 DIAGNOSIS — Z87891 Personal history of nicotine dependence: Secondary | ICD-10-CM

## 2015-03-19 DIAGNOSIS — K7581 Nonalcoholic steatohepatitis (NASH): Secondary | ICD-10-CM | POA: Diagnosis present

## 2015-03-19 DIAGNOSIS — K219 Gastro-esophageal reflux disease without esophagitis: Secondary | ICD-10-CM | POA: Diagnosis present

## 2015-03-19 DIAGNOSIS — Z794 Long term (current) use of insulin: Secondary | ICD-10-CM | POA: Diagnosis not present

## 2015-03-19 DIAGNOSIS — I25810 Atherosclerosis of coronary artery bypass graft(s) without angina pectoris: Secondary | ICD-10-CM | POA: Diagnosis not present

## 2015-03-19 DIAGNOSIS — I129 Hypertensive chronic kidney disease with stage 1 through stage 4 chronic kidney disease, or unspecified chronic kidney disease: Secondary | ICD-10-CM | POA: Diagnosis present

## 2015-03-19 DIAGNOSIS — I482 Chronic atrial fibrillation: Secondary | ICD-10-CM

## 2015-03-19 DIAGNOSIS — M79606 Pain in leg, unspecified: Secondary | ICD-10-CM | POA: Diagnosis present

## 2015-03-19 DIAGNOSIS — N179 Acute kidney failure, unspecified: Secondary | ICD-10-CM | POA: Diagnosis not present

## 2015-03-19 DIAGNOSIS — N62 Hypertrophy of breast: Secondary | ICD-10-CM | POA: Diagnosis present

## 2015-03-19 DIAGNOSIS — D696 Thrombocytopenia, unspecified: Secondary | ICD-10-CM | POA: Diagnosis present

## 2015-03-19 DIAGNOSIS — N189 Chronic kidney disease, unspecified: Secondary | ICD-10-CM

## 2015-03-19 LAB — COMPREHENSIVE METABOLIC PANEL
ALT: 20 U/L (ref 17–63)
AST: 31 U/L (ref 15–41)
Albumin: 3.7 g/dL (ref 3.5–5.0)
Alkaline Phosphatase: 78 U/L (ref 38–126)
Anion gap: 11 (ref 5–15)
BILIRUBIN TOTAL: 1.1 mg/dL (ref 0.3–1.2)
BUN: 42 mg/dL — AB (ref 6–20)
CALCIUM: 9.5 mg/dL (ref 8.9–10.3)
CO2: 23 mmol/L (ref 22–32)
Chloride: 103 mmol/L (ref 101–111)
Creatinine, Ser: 1.83 mg/dL — ABNORMAL HIGH (ref 0.61–1.24)
GFR calc Af Amer: 43 mL/min — ABNORMAL LOW (ref >60)
GFR, EST NON AFRICAN AMERICAN: 37 mL/min — AB (ref >60)
GLUCOSE: 191 mg/dL — AB (ref 65–99)
POTASSIUM: 4.6 mmol/L (ref 3.5–5.1)
Sodium: 137 mmol/L (ref 135–145)
TOTAL PROTEIN: 6.9 g/dL (ref 6.5–8.1)

## 2015-03-19 LAB — GLUCOSE, CAPILLARY
Glucose-Capillary: 155 mg/dL — ABNORMAL HIGH (ref 65–99)
Glucose-Capillary: 175 mg/dL — ABNORMAL HIGH (ref 65–99)

## 2015-03-19 LAB — CREATININE, SERUM
CREATININE: 1.83 mg/dL — AB (ref 0.61–1.24)
GFR calc Af Amer: 43 mL/min — ABNORMAL LOW (ref >60)
GFR calc non Af Amer: 37 mL/min — ABNORMAL LOW (ref >60)

## 2015-03-19 LAB — CBC
HCT: 32.5 % — ABNORMAL LOW (ref 39.0–52.0)
HEMATOCRIT: 33.3 % — AB (ref 39.0–52.0)
HEMOGLOBIN: 10.4 g/dL — AB (ref 13.0–17.0)
Hemoglobin: 10 g/dL — ABNORMAL LOW (ref 13.0–17.0)
MCH: 27.2 pg (ref 26.0–34.0)
MCH: 27.8 pg (ref 26.0–34.0)
MCHC: 30.8 g/dL (ref 30.0–36.0)
MCHC: 31.2 g/dL (ref 30.0–36.0)
MCV: 88.3 fL (ref 78.0–100.0)
MCV: 89 fL (ref 78.0–100.0)
PLATELETS: 127 10*3/uL — AB (ref 150–400)
Platelets: 106 10*3/uL — ABNORMAL LOW (ref 150–400)
RBC: 3.68 MIL/uL — ABNORMAL LOW (ref 4.22–5.81)
RBC: 3.74 MIL/uL — AB (ref 4.22–5.81)
RDW: 17 % — AB (ref 11.5–15.5)
RDW: 17 % — ABNORMAL HIGH (ref 11.5–15.5)
WBC: 5.7 10*3/uL (ref 4.0–10.5)
WBC: 5.8 10*3/uL (ref 4.0–10.5)

## 2015-03-19 LAB — BRAIN NATRIURETIC PEPTIDE: B NATRIURETIC PEPTIDE 5: 208.2 pg/mL — AB (ref 0.0–100.0)

## 2015-03-19 LAB — TSH: TSH: 3.521 u[IU]/mL (ref 0.350–4.500)

## 2015-03-19 MED ORDER — FAMOTIDINE 20 MG PO TABS
20.0000 mg | ORAL_TABLET | Freq: Every day | ORAL | Status: DC
  Administered 2015-03-19 – 2015-03-21 (×3): 20 mg via ORAL
  Filled 2015-03-19 (×3): qty 1

## 2015-03-19 MED ORDER — FERROUS SULFATE 325 (65 FE) MG PO TABS
325.0000 mg | ORAL_TABLET | Freq: Two times a day (BID) | ORAL | Status: DC
Start: 2015-03-19 — End: 2015-03-22
  Administered 2015-03-19 – 2015-03-22 (×6): 325 mg via ORAL
  Filled 2015-03-19 (×6): qty 1

## 2015-03-19 MED ORDER — SODIUM CHLORIDE 0.9 % IV SOLN: 250.0000 mL | INTRAVENOUS | Status: DC | PRN

## 2015-03-19 MED ORDER — ALLOPURINOL 100 MG PO TABS
100.0000 mg | ORAL_TABLET | Freq: Every day | ORAL | Status: DC
  Administered 2015-03-20 – 2015-03-22 (×3): 100 mg via ORAL
  Filled 2015-03-19 (×4): qty 1

## 2015-03-19 MED ORDER — METOPROLOL SUCCINATE ER 100 MG PO TB24
100.0000 mg | ORAL_TABLET | Freq: Two times a day (BID) | ORAL | Status: DC
  Administered 2015-03-19 – 2015-03-22 (×6): 100 mg via ORAL
  Filled 2015-03-19 (×6): qty 1

## 2015-03-19 MED ORDER — VERAPAMIL HCL ER 180 MG PO TBCR
360.0000 mg | EXTENDED_RELEASE_TABLET | Freq: Every day | ORAL | Status: DC
  Filled 2015-03-19: qty 2

## 2015-03-19 MED ORDER — POTASSIUM CHLORIDE CRYS ER 20 MEQ PO TBCR
20.0000 meq | EXTENDED_RELEASE_TABLET | Freq: Once | ORAL | Status: AC
  Administered 2015-03-19: 20 meq via ORAL

## 2015-03-19 MED ORDER — ONDANSETRON HCL 4 MG/2ML IJ SOLN: 4.0000 mg | Freq: Four times a day (QID) | INTRAMUSCULAR | Status: DC | PRN

## 2015-03-19 MED ORDER — ATORVASTATIN CALCIUM 20 MG PO TABS
20.0000 mg | ORAL_TABLET | Freq: Every day | ORAL | Status: DC
  Administered 2015-03-19 – 2015-03-21 (×3): 20 mg via ORAL
  Filled 2015-03-19 (×3): qty 1

## 2015-03-19 MED ORDER — FUROSEMIDE 10 MG/ML IJ SOLN
12.0000 mg/h | INTRAVENOUS | Status: DC
  Administered 2015-03-19 – 2015-03-20 (×2): 12 mg/h via INTRAVENOUS
  Filled 2015-03-19 (×5): qty 25

## 2015-03-19 MED ORDER — PANTOPRAZOLE SODIUM 40 MG PO TBEC
40.0000 mg | DELAYED_RELEASE_TABLET | Freq: Every day | ORAL | Status: DC
  Administered 2015-03-19 – 2015-03-22 (×4): 40 mg via ORAL
  Filled 2015-03-19 (×4): qty 1

## 2015-03-19 MED ORDER — FUROSEMIDE 10 MG/ML IJ SOLN
80.0000 mg | Freq: Once | INTRAMUSCULAR | Status: AC
  Administered 2015-03-19: 80 mg via INTRAVENOUS

## 2015-03-19 MED ORDER — IPRATROPIUM-ALBUTEROL 0.5-2.5 (3) MG/3ML IN SOLN: 3.0000 mL | RESPIRATORY_TRACT | Status: DC | PRN

## 2015-03-19 MED ORDER — SPIRONOLACTONE 25 MG PO TABS
12.5000 mg | ORAL_TABLET | Freq: Every day | ORAL | Status: DC
Start: 2015-03-19 — End: 2015-03-22
  Administered 2015-03-19 – 2015-03-22 (×4): 12.5 mg via ORAL
  Filled 2015-03-19 (×4): qty 1

## 2015-03-19 MED ORDER — ACETAMINOPHEN 325 MG PO TABS
650.0000 mg | ORAL_TABLET | ORAL | Status: DC | PRN
  Administered 2015-03-21: 650 mg via ORAL
  Filled 2015-03-19: qty 2

## 2015-03-19 MED ORDER — COLCHICINE 0.6 MG PO TABS
0.6000 mg | ORAL_TABLET | Freq: Two times a day (BID) | ORAL | Status: DC
  Administered 2015-03-19 – 2015-03-22 (×6): 0.6 mg via ORAL
  Filled 2015-03-19 (×6): qty 1

## 2015-03-19 MED ORDER — ENOXAPARIN SODIUM 40 MG/0.4ML ~~LOC~~ SOLN
40.0000 mg | SUBCUTANEOUS | Status: DC
  Administered 2015-03-20 – 2015-03-21 (×2): 40 mg via SUBCUTANEOUS
  Filled 2015-03-19 (×3): qty 0.4

## 2015-03-19 MED ORDER — IPRATROPIUM BROMIDE 0.02 % IN SOLN: 0.5000 mg | RESPIRATORY_TRACT | Status: DC

## 2015-03-19 MED ORDER — POTASSIUM CHLORIDE CRYS ER 20 MEQ PO TBCR
40.0000 meq | EXTENDED_RELEASE_TABLET | Freq: Three times a day (TID) | ORAL | Status: DC
  Administered 2015-03-19 (×2): 40 meq via ORAL
  Filled 2015-03-19 (×2): qty 2

## 2015-03-19 MED ORDER — IPRATROPIUM BROMIDE HFA 17 MCG/ACT IN AERS: 2.0000 | INHALATION_SPRAY | RESPIRATORY_TRACT | Status: DC

## 2015-03-19 MED ORDER — SODIUM CHLORIDE 0.9 % IJ SOLN
3.0000 mL | Freq: Two times a day (BID) | INTRAMUSCULAR | Status: DC
  Administered 2015-03-20 – 2015-03-22 (×5): 3 mL via INTRAVENOUS

## 2015-03-19 MED ORDER — SODIUM CHLORIDE 0.9 % IJ SOLN: 3.0000 mL | INTRAMUSCULAR | Status: DC | PRN

## 2015-03-19 NOTE — Progress Notes (Signed)
Pt had 1000 cc of urine output.  Report was called and pt was transferred to 3E22 via wheelchair

## 2015-03-19 NOTE — Telephone Encounter (Signed)
Will see patient this after noon at 1430.

## 2015-03-19 NOTE — Telephone Encounter (Signed)
Patient called and said that he is very SOB and his abdomin is very big, that he feels like a "blimp". His weight is 295 lbs.  He has no edema in his ankles. He says that he has been taking his Metolazone on Thursday like suppose to.  He is not urinating like he should.  He said he saw his PCP and he thought that he needed to be seen.

## 2015-03-19 NOTE — Addendum Note (Signed)
Encounter addended by: Scarlette Calico, RN on: 03/19/2015  4:48 PM<BR>     Documentation filed: Notes Section

## 2015-03-19 NOTE — Addendum Note (Signed)
Encounter addended by: Effie Berkshire, RN on: 03/19/2015  4:04 PM<BR>     Documentation filed: [employer], Orders, [facility]

## 2015-03-19 NOTE — Progress Notes (Signed)
Advanced Heart Failure Medication Review by a Pharmacist  Does the patient  feel that his/her medications are working for him/her?  yes  Has the patient been experiencing any side effects to the medications prescribed?  no  Does the patient measure his/her own blood pressure or blood glucose at home?  no   Does the patient have any problems obtaining medications due to transportation or finances?   no  Understanding of regimen: good Understanding of indications: good Potential of compliance: good   Pharmacist comments:  [name] is a pleasant 69 yo M presenting with his medication bottles. He seems to have a good understanding of his regimen and the importance of consistent use. He states excellent compliance with this medications. He does state that his copays are very high so I offered to look into the PAN foundation for him.   SSN: [id number] Household: 2 + 1 son Household: 2200 + 679 + 513 per month Taxes: yes  Erika K. Velva Harman, PharmD, BCPS, CPP Clinical Pharmacist Pager: [phone] Phone: [phone] 03/19/2015 3:17 PM

## 2015-03-19 NOTE — H&P (Signed)
GI: Dr Laural Golden- followed for cirrhosis Pulmonary: Dr Melvyn Novas  PCP: Marjean Donna [facility] at [facility])  Dr. Chalmers Cater (Endocrinologist)  Mr Stief is a 69 yo with history of CAD s/p CABG, permanent atrial fibrillation, chronic diastolic CHF, severe AS s/p bioprosthetic AVR presents for evaluation of diastolic CHF. Last LHC in 8/13 showed patent grafts. AVR was in 10/13. Last echo in 8/15 showed EF 55-60%, mild LVH, bioprosthetic AV with mean gradient 16, mild to moderate MR, PASP 46 mmHg. [name]   RHC (12/2013): RA 14 RV 44/4/11 PA 50/23 (33) PCWP 20, v = 35 Fick CO/CI: 6.0/2.6 PVR 2.2 WU PA 59%  July 2015: Admitted to the hospital for GI bleed requiring 2 PRBCs. No additional GI work-up with recent EGD showing gastritis.  He had an AV nodal ablation in 9/15 with significant improvement after this. He has St Jude PPM.   He returns for HF follow up. His weight is up 18 lbs. He is markedly short of breath, dyspneic just walking around his house. This bad x 2-3 weeks but building up over months. He is orthopneic, has to sleep at 45 degree angle. Abdomen is distended. Had abdominal US in 7/16 with no ascites. He has been using his CPAP. Of note, daughter died of cancer about 2 wks ago. Follows high sodium diet, eats pickles frequently.   Labs (8/14) LDL 38 Labs (3/15) K 3.4, creatinine 1.7, BUN 55  Labs (10/15/13) K 4.0 Creatinine 1.6 BUN 32 Labs (10/29/13) K 4.0 Creatinine 1.6  Labs (4/15) K 4.2 Creatinine 1.32 => 1.5, HCT 35.2 Labs (12/11/13) K 3.6 Creatinine 1.56 Pro BNP 1152 Hemoglobin 9.6  Labs (01/12/14) K 3.0, creatinine 2.0, hemoglobin 8.8 Labs (01/31/14) K 3.3, creatinine 1.49 Labs (8/15) K 3.9, creatinine 1.89 Labs (9/15): K 3.7, creatinine 1.6, AST 48, ALT 52, TSH normal Labs (10/15): K 3.5, creatinine 1.58, BNP 780 Labs (3/16): K 3.5, creatinine 1.6 Labs 12/03/2014: K 4.4 Creatinine 1.55 Labs 12/12/2014: K 3.7 Creatinine 1.48, LDL 50  PMH: 1. CAD: s/p CABG 2004. Last LHC  (8/13) with patent free radial-OM, patent SVG-D, patent LIMA-LAD.  2. Permanent atrial fibrillation: Not anticoagulated due to GI bleeding. Holter monitor (4/15) with mean HR 115 (afib). He had AV nodal ablation with St Jude dual chamber PPM  3. Chronic diastolic CHF: Echo (5/42) with EF 55-60%, bioprosthetic aortic valve with mean gradient 22 mmHg, mildly decreased RV systolic function. RHC (12/2013): RA 14, RV 44/4/11, PA 50/23 (33), PCWP 20, v = 35, Fick CO/CI: 6.0/2.6, PVR 2.2 WU, PA 59%. Echo (8/15) with EF 55-60%, mild LVH, bioprosthetic aortic valve with mean gradient 16 mmHg, PA systolic pressure 46 mmHg, mild to moderate MR.  4. Severe aortic stenosis s/p bioprosthetic AVR in 10/13. Mean gradient 22 mmHg across valve on echo in 8/14. Mean gradient 16 mmHg on echo in 8/15.  5. NASH: ascites, thrombocytopenia. Had paracentesis in 1/15. Gynecomastia with spironolactone.  6. 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 EGD in 1/15 showed mild portal gastropathy and 2 small antral ulcers. Recurrent GI bleed in 6/15, EGD showed gastritis and ?GAVE.  7. Hyperlipidemia 8. HTN 9. GERD 10. COPD 16. OSA: Cannot tolerate CPAP.  12. CKD 13. Gout 14. Carotid dopplers (11/15) with minimal stenosis.  15. ABI 12/11/2014: normal   SH: Lives with wife in [place], prior smoker.   FH: CAD  ROS: All systems reviewed and negative except as per HPI.   Current Outpatient Prescriptions on File Prior to Encounter  Medication Sig Dispense  Refill  . allopurinol (ZYLOPRIM) 100 MG tablet TAKE 1 TABLET (100 MG TOTAL) BY MOUTH DAILY. 30 tablet 6  . atorvastatin (LIPITOR) 20 MG tablet Take 20 mg by mouth every morning.     . benzonatate (TESSALON) 200 MG capsule Take 1 capsule (200 mg total) by mouth 3 (three) times daily as needed for cough. 40 capsule 1  . colchicine 0.6 MG tablet Take 0.6 mg by mouth daily.     [name] gabapentin (NEURONTIN) 300 MG capsule Take 300 mg by mouth 3  (three) times daily.  6  . insulin NPH Human (HUMULIN N,NOVOLIN N) 100 UNIT/ML injection Inject 20-25 Units into the skin 2 (two) times daily before a meal.     . insulin regular (NOVOLIN R,HUMULIN R) 100 units/mL injection Inject 10-20 Units into the skin 3 (three) times daily before meals. Per sliding scale    . ipratropium (ATROVENT) 0.06 % nasal spray Place 2 sprays into both nostrils 4 (four) times daily. 15 mL 12  . metolazone (ZAROXOLYN) 2.5 MG tablet Take 2.5 mg (1 tablet) Every Thursday. 7 tablet 3  . metoprolol succinate (TOPROL-XL) 100 MG 24 hr tablet TAKE 1 TABLET BY MOUTH TWICE DAILY WITH OR IMMEDIATELY FOLLOWING A MEAL 60 tablet 6  . Multiple Vitamins-Minerals ([employer]S SPECTRAVITE ADULT 50+ PO) Take 1 tablet by mouth daily.    [name] omeprazole (PRILOSEC) 20 MG capsule Take 1 capsule (20 mg total) by mouth 2 (two) times daily before a meal. 30 capsule 5  . potassium chloride (KLOR-CON 10) 10 MEQ tablet Take 6 tablets (60 mEq total) by mouth 2 (two) times daily. Take extra 40 meq with Metolazone. 360 tablet 3  . spironolactone (ALDACTONE) 25 MG tablet TAKE 1/2 TABLET BY MOUTH EVERY DAY 15 tablet 5  . torsemide (DEMADEX) 20 MG tablet TAKE 6 TABLETS IN THE MORNING AND 3 TABLETS IN THE EVENING 270 tablet 3  . valsartan (DIOVAN) 160 MG tablet Take 1 tablet (160 mg total) by mouth daily. 30 tablet 11  . verapamil (CALAN-SR) 240 MG CR tablet TAKE 1 & 1/2 TABLETS BY MOUTH AT BEDTIME (Patient taking differently: TAKE 1 & 1/2 TABLETS BY MOUTH in the morning) 45 tablet 6  . vitamin C (ASCORBIC ACID) 500 MG tablet Take 500 mg by mouth 2 (two) times daily.     No current facility-administered medications on file prior to encounter.    Filed Vitals:   03/19/15 1444  BP: 116/58  Pulse: 85  Weight: 294 lb (133.358 kg)  SpO2: 95%    General: NAD. Wife present Neck: JVP 16, no thyromegaly or thyroid nodule.  Lungs:  Clear to auscultation bilaterally with normal respiratory effort. CV: Nondisplaced PMI. Irregular S1/S2, no S3/S4, 2/6 early SEM RUSB. 1+ edema 1/2 to knees bilaterally. Left carotid bruit. 1+ PT pulses bilaterally.  Abdomen: Soft, obese nontender, no hepatosplenomegaly. Moderately distended.  Skin: Intact without lesions or rashes.  Neurologic: Alert and oriented x 3.  Psych: Normal affect. Extremities: No clubbing or cyanosis.   Assessment/Plan: 1. Acute/Chronic diastolic CHF: EF 67-34% (07/9377). NYHA class IIIb-IV symptoms, marked volume overload on exam. I do not think that he is going to be able to manage this at home. Part of the problem is a very high sodium diet.  - Lasix 80 mg IV in clinic, admit for diuresis. Will start Lasix gtt at 12 mg/hr.  - Send BMET today.  - Echo this admission.  2. Chronic atrial fibrillation: CHADSVASC 3 - CHF, HTN, DM2. He  is not anticoagulated (was on coumadin in past) due to history of GI bleeding from AVMs. We tried ASA, however it was stopped again due to GIB. HR was very difficult to control, concerned that elevated HR contributed to CHF and exercise intolerance. He has had AV nodal ablation with dual chamber pacing. Would continue verapamil and Toprol XL. 3. CKD stage III: BMET today. Will hold valsartan while diuresing.  4. Bioprosthetic AVR: 8/15 echo showed stable bioprosthetic aortic valve. Repeat echo this admission.  5. CAD: s/p CABG. No chest pain. Continue statin, BB. He has been off aspirin and anticoagulation with history of GI bleeding.  6. Cirrhosis: NASH-related. Sees Dr. Laural Golden. Repeat abdominal US as inpatient, paracentesis if there is ascites.  7. Gout: Continues on allopurinol.  8. Leg pain: Had ABI 5/16--> Normal.  Dene Landsberg NP-C  4:22 PM  [name] 03/19/2015

## 2015-03-19 NOTE — Progress Notes (Signed)
Patient ID: [name], male   DOB: Sep 12, 1945, 69 y.o.   MRN: [id number]  GI: Dr Laural Golden- followed for cirrhosis Pulmonary: Dr Melvyn Novas  PCP: Marjean Donna [facility] at [facility])  Dr. Chalmers Cater (Endocrinologist)  Mr [name] is a 69 yo with history of CAD s/p CABG, permanent atrial fibrillation, chronic diastolic CHF, severe AS s/p bioprosthetic AVR presents for evaluation of diastolic CHF.  Last LHC in 8/13 showed patent grafts.  AVR was in 10/13.  Last echo in 8/15 showed EF 55-60%, mild LVH, bioprosthetic AV with mean gradient 16, mild to moderate MR, PASP 46 mmHg. [name]    RHC (12/2013): RA 14 RV 44/4/11 PA 50/23 (33) PCWP 20, v = 35 Fick CO/CI: 6.0/2.6 PVR 2.2 WU PA 59%  July 2015:  Admitted to the hospital for GI bleed requiring 2 PRBCs. No additional GI work-up with recent EGD showing gastritis.  He had an AV nodal ablation in 9/15 with significant improvement after this.  He has St Jude PPM.   He returns for HF follow up. His weight is up 18 lbs.  He is markedly short of breath, dyspneic just walking around his house.  This bad x 2-3 weeks but building up over months.  He is orthopneic, has to sleep at 45 degree angle.  Abdomen is distended.  Had abdominal US in 7/16 with no ascites.  He has been using his CPAP.  Of note, daughter died of cancer about 2 wks ago.   Follows high sodium diet, eats pickles frequently.    Labs (8/14) LDL 38 Labs (3/15) K 3.4, creatinine 1.7, BUN 55  Labs (10/15/13) K 4.0 Creatinine 1.6 BUN 32 Labs (10/29/13) K 4.0 Creatinine 1.6  Labs (4/15) K 4.2 Creatinine 1.32 => 1.5, HCT 35.2 Labs (12/11/13) K 3.6 Creatinine 1.56 Pro BNP 1152 Hemoglobin 9.6  Labs (01/12/14) K 3.0, creatinine 2.0, hemoglobin 8.8 Labs (01/31/14) K 3.3, creatinine 1.49 Labs (8/15) K 3.9, creatinine 1.89 Labs (9/15): K 3.7, creatinine 1.6, AST 48, ALT 52, TSH normal Labs (10/15): K 3.5, creatinine 1.58, BNP 780 Labs (3/16): K 3.5, creatinine 1.6 Labs 12/03/2014: K 4.4 Creatinine 1.55 Labs  12/12/2014: K 3.7 Creatinine 1.48, LDL 50  PMH: 1. CAD: s/p CABG 2004. Last LHC (8/13) with patent free radial-OM, patent SVG-D, patent LIMA-LAD.  2. Permanent atrial fibrillation: Not anticoagulated due to GI bleeding.  Holter monitor (4/15) with mean HR 115 (afib).   He had AV nodal ablation with St Jude dual chamber PPM  3. Chronic diastolic CHF: Echo (0/62) with EF 55-60%, bioprosthetic aortic valve with mean gradient 22 mmHg, mildly decreased RV systolic function. RHC (12/2013): RA 14, RV 44/4/11, PA 50/23 (33), PCWP 20, v = 35, Fick CO/CI: 6.0/2.6, PVR 2.2 WU, PA 59%.  Echo (8/15) with EF 55-60%, mild LVH, bioprosthetic aortic valve with mean gradient 16 mmHg, PA systolic pressure 46 mmHg, mild to moderate MR.  4. Severe aortic stenosis s/p bioprosthetic AVR in 10/13. Mean gradient 22 mmHg across valve on echo in 8/14.  Mean gradient 16 mmHg on echo in 8/15.  5. NASH: ascites, thrombocytopenia.  Had paracentesis in 1/15. Gynecomastia with spironolactone.  6. 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  EGD in 1/15 showed mild portal gastropathy and 2 small antral ulcers. Recurrent GI bleed in 6/15, EGD showed gastritis and ?GAVE.  7. Hyperlipidemia 8. HTN 9. GERD 10. COPD 19. OSA: Cannot tolerate CPAP.  12. CKD 13. Gout 14. Carotid dopplers (11/15) with minimal stenosis.  15. ABI 12/11/2014:  normal    SH: Lives with wife in [place], prior smoker.   FH: CAD  ROS: All systems reviewed and negative except as per HPI.   Current Outpatient Prescriptions on File Prior to Encounter  Medication Sig Dispense Refill  . allopurinol (ZYLOPRIM) 100 MG tablet TAKE 1 TABLET (100 MG TOTAL) BY MOUTH DAILY. 30 tablet 6  . atorvastatin (LIPITOR) 20 MG tablet Take 20 mg by mouth every morning.     . benzonatate (TESSALON) 200 MG capsule Take 1 capsule (200 mg total) by mouth 3 (three) times daily as needed for cough. 40 capsule 1  . colchicine 0.6 MG tablet Take 0.6 mg by mouth daily.     [name] gabapentin  (NEURONTIN) 300 MG capsule Take 300 mg by mouth 3 (three) times daily.  6  . insulin NPH Human (HUMULIN N,NOVOLIN N) 100 UNIT/ML injection Inject 20-25 Units into the skin 2 (two) times daily before a meal.     . insulin regular (NOVOLIN R,HUMULIN R) 100 units/mL injection Inject 10-20 Units into the skin 3 (three) times daily before meals. Per sliding scale    . ipratropium (ATROVENT) 0.06 % nasal spray Place 2 sprays into both nostrils 4 (four) times daily. 15 mL 12  . metolazone (ZAROXOLYN) 2.5 MG tablet Take 2.5 mg (1 tablet) Every Thursday. 7 tablet 3  . metoprolol succinate (TOPROL-XL) 100 MG 24 hr tablet TAKE 1 TABLET BY MOUTH TWICE DAILY WITH OR IMMEDIATELY FOLLOWING A MEAL 60 tablet 6  . Multiple Vitamins-Minerals (CVS SPECTRAVITE ADULT 50+ PO) Take 1 tablet by mouth daily.    [name] omeprazole (PRILOSEC) 20 MG capsule Take 1 capsule (20 mg total) by mouth 2 (two) times daily before a meal. 30 capsule 5  . potassium chloride (KLOR-CON 10) 10 MEQ tablet Take 6 tablets (60 mEq total) by mouth 2 (two) times daily. Take extra 40 meq with Metolazone. 360 tablet 3  . spironolactone (ALDACTONE) 25 MG tablet TAKE 1/2 TABLET BY MOUTH EVERY DAY 15 tablet 5  . torsemide (DEMADEX) 20 MG tablet TAKE 6 TABLETS IN THE MORNING AND 3 TABLETS IN THE EVENING 270 tablet 3  . valsartan (DIOVAN) 160 MG tablet Take 1 tablet (160 mg total) by mouth daily. 30 tablet 11  . verapamil (CALAN-SR) 240 MG CR tablet TAKE 1 & 1/2 TABLETS BY MOUTH AT BEDTIME (Patient taking differently: TAKE 1 & 1/2 TABLETS BY MOUTH in the morning) 45 tablet 6  . vitamin C (ASCORBIC ACID) 500 MG tablet Take 500 mg by mouth 2 (two) times daily.     No current facility-administered medications on file prior to encounter.    Filed Vitals:   03/19/15 1444  BP: 116/58  Pulse: 85  Weight: 294 lb (133.358 kg)  SpO2: 95%    General: NAD. Wife present Neck: JVP 16, no thyromegaly or thyroid nodule.  Lungs: Clear to auscultation bilaterally  with normal respiratory effort. CV: Nondisplaced PMI.  Irregular S1/S2, no S3/S4, 2/6 early SEM RUSB.  1+ edema 1/2 to knees bilaterally.  Left carotid bruit.  1+ PT pulses bilaterally.  Abdomen: Soft, obese nontender, no hepatosplenomegaly.  Moderately distended.  Skin: Intact without lesions or rashes.  Neurologic: Alert and oriented x 3.  Psych: Normal affect. Extremities: No clubbing or cyanosis.   Assessment/Plan: 1. Chronic diastolic CHF:  EF 17-40% (02/2014).  NYHA class IIIb-IV symptoms, marked volume overload on exam.  I do not think that he is going to be able to manage this at home.  Part of the problem is a very high sodium diet.  - Lasix 80 mg IV in clinic, admit for diuresis.  Will start Lasix gtt at 12 mg/hr.  - Send BMET today.  - Echo this admission.  2. Chronic atrial fibrillation:  CHADSVASC 3 - CHF, HTN, DM2. 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 We tried ASA, however it was stopped again due to GIB. HR was very difficult to control, concerned that elevated HR contributed to CHF and exercise intolerance.  He has had AV nodal ablation with dual chamber pacing.  Would continue verapamil and Toprol XL. 3. CKD stage III: BMET today.  Will hold valsartan while diuresing.  4. Bioprosthetic AVR: 8/15 echo showed stable bioprosthetic aortic valve.  Repeat echo this admission.  5. CAD: s/p CABG. No chest pain. Continue statin, BB. He has been off aspirin and anticoagulation with history of GI bleeding.  6. Cirrhosis: NASH-related. Sees Dr. Laural Golden. Repeat abdominal US as inpatient, paracentesis if there is ascites.  7. Gout: Continues on allopurinol.   8. Leg pain:  Had ABI 5/16--> Normal.     [name] 03/19/2015

## 2015-03-20 ENCOUNTER — Inpatient Hospital Stay (HOSPITAL_COMMUNITY): Payer: Medicare Other

## 2015-03-20 DIAGNOSIS — I482 Chronic atrial fibrillation: Secondary | ICD-10-CM

## 2015-03-20 DIAGNOSIS — I509 Heart failure, unspecified: Secondary | ICD-10-CM

## 2015-03-20 DIAGNOSIS — N183 Chronic kidney disease, stage 3 (moderate): Secondary | ICD-10-CM

## 2015-03-20 DIAGNOSIS — I5033 Acute on chronic diastolic (congestive) heart failure: Principal | ICD-10-CM

## 2015-03-20 LAB — BASIC METABOLIC PANEL
ANION GAP: 11 (ref 5–15)
BUN: 43 mg/dL — ABNORMAL HIGH (ref 6–20)
CALCIUM: 9.8 mg/dL (ref 8.9–10.3)
CHLORIDE: 101 mmol/L (ref 101–111)
CO2: 27 mmol/L (ref 22–32)
CREATININE: 2.05 mg/dL — AB (ref 0.61–1.24)
GFR calc non Af Amer: 32 mL/min — ABNORMAL LOW (ref >60)
GFR, EST AFRICAN AMERICAN: 37 mL/min — AB (ref >60)
Glucose, Bld: 193 mg/dL — ABNORMAL HIGH (ref 65–99)
Potassium: 4.9 mmol/L (ref 3.5–5.1)
SODIUM: 139 mmol/L (ref 135–145)

## 2015-03-20 LAB — GLUCOSE, CAPILLARY
GLUCOSE-CAPILLARY: 222 mg/dL — AB (ref 65–99)
GLUCOSE-CAPILLARY: 207 mg/dL — AB (ref 65–99)
Glucose-Capillary: 191 mg/dL — ABNORMAL HIGH (ref 65–99)

## 2015-03-20 MED ORDER — POTASSIUM CHLORIDE CRYS ER 20 MEQ PO TBCR: 40.0000 meq | EXTENDED_RELEASE_TABLET | Freq: Two times a day (BID) | ORAL | Status: DC

## 2015-03-20 MED ORDER — INSULIN ASPART 100 UNIT/ML ~~LOC~~ SOLN: 0.0000 [IU] | Freq: Three times a day (TID) | SUBCUTANEOUS | Status: DC

## 2015-03-20 MED ORDER — POTASSIUM CHLORIDE CRYS ER 20 MEQ PO TBCR
40.0000 meq | EXTENDED_RELEASE_TABLET | Freq: Every day | ORAL | Status: DC
  Administered 2015-03-20 – 2015-03-22 (×3): 40 meq via ORAL
  Filled 2015-03-20 (×3): qty 2

## 2015-03-20 MED ORDER — INSULIN ASPART 100 UNIT/ML ~~LOC~~ SOLN: 0.0000 [IU] | Freq: Every day | SUBCUTANEOUS | Status: DC

## 2015-03-20 MED ORDER — INSULIN ASPART 100 UNIT/ML ~~LOC~~ SOLN
0.0000 [IU] | Freq: Three times a day (TID) | SUBCUTANEOUS | Status: DC
  Administered 2015-03-20 – 2015-03-21 (×3): 5 [IU] via SUBCUTANEOUS
  Administered 2015-03-21: 11 [IU] via SUBCUTANEOUS
  Administered 2015-03-21: 5 [IU] via SUBCUTANEOUS
  Administered 2015-03-22 (×2): 8 [IU] via SUBCUTANEOUS

## 2015-03-20 MED ORDER — VERAPAMIL HCL ER 120 MG PO TBCR
120.0000 mg | EXTENDED_RELEASE_TABLET | Freq: Every day | ORAL | Status: DC
  Administered 2015-03-20: 120 mg via ORAL
  Filled 2015-03-20 (×2): qty 1

## 2015-03-20 NOTE — Progress Notes (Signed)
Patient ID: [name], male   DOB: 12-20-1945, 69 y.o.   MRN: [id number]   Limited US Abd performed No fluid pocket identified No paracentesis performed

## 2015-03-20 NOTE — Progress Notes (Signed)
Patient alert and oriented x4 this evening, wife at bedside.  Patient and wife deny any questions or concerns at this time.  Vital signs stable.

## 2015-03-20 NOTE — Progress Notes (Signed)
*  PRELIMINARY RESULTS* Echocardiogram 2D Echocardiogram has been performed.  Leavy Cella 03/20/2015, 4:20 PM

## 2015-03-20 NOTE — Progress Notes (Signed)
Patient refused CPAP for tonight. RT informed patient to call if he changes his mind.

## 2015-03-20 NOTE — Progress Notes (Signed)
Notified Dr. Haroldine Laws that diabetic educator has seen pt and has some recommendations made in E. Chart.  Instructed to place the recommendations made for pt.  Notified Barnie Alderman , RN, and instructed that she will put the orders, so MD will sign it.  Karie Kirks, Therapist, sports.

## 2015-03-20 NOTE — Progress Notes (Signed)
Inpatient Diabetes Program Recommendations  AACE/ADA: New Consensus Statement on Inpatient Glycemic Control (2013)  Target Ranges:  Prepandial:   less than 140 mg/dL      Peak postprandial:   less than 180 mg/dL (1-2 hours)      Critically ill patients:  140 - 180 mg/dL  Results for VEASNA, SANTIBANEZ (MRN [id number]) as of 03/20/2015 10:13  Ref. Range 03/20/2015 03:56  Glucose Latest Ref Range: 65-99 mg/dL 193 (H)   Results for CHRISTROPHER, GINTZ (MRN [id number]) as of 03/20/2015 10:13  Ref. Range 03/19/2015 17:01 03/19/2015 22:37  Glucose-Capillary Latest Ref Range: 65-99 mg/dL 155 (H) 175 (H)    Diabetes history: DM2 Outpatient Diabetes medications: Novolin N 30 units BID before meals, Novolin R 20 units BID before meals Current orders for Inpatient glycemic control: None  Inpatient Diabetes Program Recommendations Correction (SSI): Fasting lab glucose this morning was 193 mg/dl. Please order CBGs with Novolog correction scale ACHS. HgbA1C: Please order an A1C to evaluate glycemic control over the past 2-3 months.  Thanks, Barnie Alderman, RN, MSN, CCRN, CDE Diabetes Coordinator Inpatient Diabetes Program [phone] (Team Pager from [place] to [place]) [phone] (AP office) [phone] [facility] office) [phone] [facility] [place] [facility] [place] office)

## 2015-03-20 NOTE — Progress Notes (Signed)
UR completed 

## 2015-03-20 NOTE — Progress Notes (Signed)
Patient ID: [name], male   DOB: 1946/04/03, 69 y.o.   MRN: [id number]   SUBJECTIVE: Patient diuresed well yesterday on Lasix gtt, weight down.  Still very distended abdomen.  Creatinine 1.8 => 2.   Scheduled Meds: . allopurinol  100 mg Oral Daily  . atorvastatin  20 mg Oral q1800  . colchicine  0.6 mg Oral BID  . enoxaparin (LOVENOX) injection  40 mg Subcutaneous Q24H  . famotidine  20 mg Oral QHS  . ferrous sulfate  325 mg Oral BID WC  . metoprolol succinate  100 mg Oral BID  . pantoprazole  40 mg Oral Daily  . potassium chloride  40 mEq Oral Daily  . sodium chloride  3 mL Intravenous Q12H  . spironolactone  12.5 mg Oral Daily  . verapamil  120 mg Oral Daily   Continuous Infusions: . furosemide (LASIX) infusion 12 mg/hr (03/19/15 2056)   PRN Meds:.sodium chloride, acetaminophen, ipratropium-albuterol, ondansetron (ZOFRAN) IV, sodium chloride    Filed Vitals:   03/19/15 1705 03/19/15 2055 03/20/15 0115 03/20/15 0535  BP: 103/72 98/58 117/65 112/84  Pulse: 71 73 73 77  Temp: 97.9 F (36.6 C) 98.7 F (37.1 C) 98.7 F (37.1 C) 98.9 F (37.2 C)  TempSrc: Oral Oral Oral Oral  Resp: 18 18 16 18   Height: 5\' 8"  (1.727 m)     Weight: 290 lb (131.543 kg)   283 lb 4.8 oz (128.504 kg)  SpO2: 98% 97% 97% 97%    Intake/Output Summary (Last 24 hours) at 03/20/15 0829 Last data filed at 03/20/15 0747  Gross per 24 hour  Intake  588.8 ml  Output   2750 ml  Net -2161.2 ml    LABS: Basic Metabolic Panel:  Recent Labs  03/19/15 1524 03/19/15 1813 03/20/15 0356  NA 137  --  139  K 4.6  --  4.9  CL 103  --  101  CO2 23  --  27  GLUCOSE 191*  --  193*  BUN 42*  --  43*  CREATININE 1.83* 1.83* 2.05*  CALCIUM 9.5  --  9.8   Liver Function Tests:  Recent Labs  03/19/15 1524  AST 31  ALT 20  ALKPHOS 78  BILITOT 1.1  PROT 6.9  ALBUMIN 3.7   No results for input(s): LIPASE, AMYLASE in the last 72 hours. CBC:  Recent Labs  03/19/15 1524 03/19/15 1813    WBC 5.7 5.8  HGB 10.0* 10.4*  HCT 32.5* 33.3*  MCV 88.3 89.0  PLT 127* 106*   Cardiac Enzymes: No results for input(s): CKTOTAL, CKMB, CKMBINDEX, TROPONINI in the last 72 hours. BNP: Invalid input(s): POCBNP D-Dimer: No results for input(s): DDIMER in the last 72 hours. Hemoglobin A1C: No results for input(s): HGBA1C in the last 72 hours. Fasting Lipid Panel: No results for input(s): CHOL, HDL, LDLCALC, TRIG, CHOLHDL, LDLDIRECT in the last 72 hours. Thyroid Function Tests:  Recent Labs  03/19/15 1525  TSH 3.521   Anemia Panel: No results for input(s): VITAMINB12, FOLATE, FERRITIN, TIBC, IRON, RETICCTPCT in the last 72 hours.  RADIOLOGY: No results found.  PHYSICAL EXAM General: NAD Neck: JVP 14 cm, no thyromegaly or thyroid nodule.  Lungs: Decreased breath sounds at bases. CV: Nondisplaced PMI.  Heart regular S1/S2, no S3/S4, no murmur.  1+ ankle edema.  Abdomen: Soft, nontender, no hepatosplenomegaly, moderate distention.  Neurologic: Alert and oriented x 3.  Psych: Normal affect. Extremities: No clubbing or cyanosis.   TELEMETRY: Reviewed telemetry pt v-paced,  underlying atrial fibrillation.   ASSESSMENT AND PLAN: 1. Acute on chronic diastolic CHF: EF 62-86% (09/8175). NYHA class IIIb-IV symptoms, marked volume overload on exam when seen in office 8/25 so admitted for diuresis. Part of the problem is a very high sodium diet. He diuresed well yesterday on Lasix gtt, weight down.  Creatinine up a bit.  - Continue Lasix gtt at 12 mg/hr today, still volume overloaded.  - Echo this admission.  2. Chronic atrial fibrillation: CHADSVASC 3 - CHF, HTN, DM2. 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 We tried ASA, however it was stopped again due to GIB. HR was very difficult to control, concerned that elevated HR contributed to CHF and exercise intolerance. Therefore, he has had AV nodal ablation with dual chamber  pacing.  - Can continue Toprol XL - Will cut back on verapamil. He was on 360 daily at home.  Decrease today to 120 daily and then likely will stop.  3. CKD stage III: Mild increase in creatinine. Will hold valsartan while diuresing.  4. Bioprosthetic AVR: 8/15 echo showed stable bioprosthetic aortic valve. Repeat echo this admission.  5. CAD: s/p CABG. No chest pain. Continue statin, BB. He has been off aspirin and anticoagulation with history of GI bleeding.  6. Cirrhosis: NASH-related. Sees Dr. Laural Golden. Repeat abdominal US as inpatient, paracentesis if there is ascites.  7. Gout: Continues on allopurinol.  8. Leg pain: Had ABI 5/16--> Normal.   [name] 03/20/2015 8:34 AM

## 2015-03-21 DIAGNOSIS — N189 Chronic kidney disease, unspecified: Secondary | ICD-10-CM

## 2015-03-21 DIAGNOSIS — N179 Acute kidney failure, unspecified: Secondary | ICD-10-CM

## 2015-03-21 LAB — BASIC METABOLIC PANEL
Anion gap: 15 (ref 5–15)
BUN: 48 mg/dL — AB (ref 6–20)
CALCIUM: 9.9 mg/dL (ref 8.9–10.3)
CO2: 28 mmol/L (ref 22–32)
CREATININE: 2.24 mg/dL — AB (ref 0.61–1.24)
Chloride: 93 mmol/L — ABNORMAL LOW (ref 101–111)
GFR calc Af Amer: 33 mL/min — ABNORMAL LOW (ref >60)
GFR, EST NON AFRICAN AMERICAN: 29 mL/min — AB (ref >60)
GLUCOSE: 213 mg/dL — AB (ref 65–99)
Potassium: 3.7 mmol/L (ref 3.5–5.1)
SODIUM: 136 mmol/L (ref 135–145)

## 2015-03-21 LAB — CBC
HCT: 35.3 % — ABNORMAL LOW (ref 39.0–52.0)
Hemoglobin: 10.9 g/dL — ABNORMAL LOW (ref 13.0–17.0)
MCH: 27.3 pg (ref 26.0–34.0)
MCHC: 30.9 g/dL (ref 30.0–36.0)
MCV: 88.5 fL (ref 78.0–100.0)
PLATELETS: 126 10*3/uL — AB (ref 150–400)
RBC: 3.99 MIL/uL — ABNORMAL LOW (ref 4.22–5.81)
RDW: 16.4 % — AB (ref 11.5–15.5)
WBC: 4.8 10*3/uL (ref 4.0–10.5)

## 2015-03-21 LAB — GLUCOSE, CAPILLARY
GLUCOSE-CAPILLARY: 222 mg/dL — AB (ref 65–99)
GLUCOSE-CAPILLARY: 337 mg/dL — AB (ref 65–99)
GLUCOSE-CAPILLARY: 199 mg/dL — AB (ref 65–99)
Glucose-Capillary: 248 mg/dL — ABNORMAL HIGH (ref 65–99)

## 2015-03-21 LAB — HEMOGLOBIN A1C
HEMOGLOBIN A1C: 7.6 % — AB (ref 4.8–5.6)
MEAN PLASMA GLUCOSE: 171 mg/dL

## 2015-03-21 MED ORDER — POTASSIUM CHLORIDE CRYS ER 20 MEQ PO TBCR
40.0000 meq | EXTENDED_RELEASE_TABLET | Freq: Once | ORAL | Status: AC
  Administered 2015-03-21: 40 meq via ORAL
  Filled 2015-03-21: qty 2

## 2015-03-21 MED ORDER — TORSEMIDE 20 MG PO TABS
60.0000 mg | ORAL_TABLET | Freq: Every day | ORAL | Status: DC
  Administered 2015-03-21 – 2015-03-22 (×2): 60 mg via ORAL
  Filled 2015-03-21 (×3): qty 3

## 2015-03-21 MED ORDER — TORSEMIDE 20 MG PO TABS
120.0000 mg | ORAL_TABLET | Freq: Every day | ORAL | Status: DC
  Administered 2015-03-22: 120 mg via ORAL
  Filled 2015-03-21: qty 6

## 2015-03-21 NOTE — Progress Notes (Signed)
Patient ID: [name], male   DOB: [birth year], 69 y.o.   MRN: [id number]   SUBJECTIVE:   Patient diuresed well yesterday on Lasix gtt. Weight down 11 pounds overnight. 18 pounds total. Creatinine 1.8 => 2.0 => 2.2 Feels good. Belly less distended  Echo 03/20/15 EF 55-60%  RVSP 25mmHg  Scheduled Meds: . allopurinol  100 mg Oral Daily  . atorvastatin  20 mg Oral q1800  . colchicine  0.6 mg Oral BID  . enoxaparin (LOVENOX) injection  40 mg Subcutaneous Q24H  . famotidine  20 mg Oral QHS  . ferrous sulfate  325 mg Oral BID WC  . insulin aspart  0-15 Units Subcutaneous TID WC  . insulin aspart  0-5 Units Subcutaneous QHS  . metoprolol succinate  100 mg Oral BID  . pantoprazole  40 mg Oral Daily  . potassium chloride  40 mEq Oral Daily  . sodium chloride  3 mL Intravenous Q12H  . spironolactone  12.5 mg Oral Daily  . verapamil  120 mg Oral Daily   Continuous Infusions: . furosemide (LASIX) infusion 12 mg/hr (03/20/15 1758)   PRN Meds:.sodium chloride, acetaminophen, ipratropium-albuterol, ondansetron (ZOFRAN) IV, sodium chloride    Filed Vitals:   03/20/15 1807 03/20/15 2049 03/20/15 2054 03/21/15 0554  BP: 105/57 114/58 114/58 117/59  Pulse: 71  76 75  Temp:   98.3 F (36.8 C) 97.9 F (36.6 C)  TempSrc:   Oral Oral  Resp: 18  18 18   Height:      Weight:    123.469 kg (272 lb 3.2 oz)  SpO2: 97%  98% 98%    Intake/Output Summary (Last 24 hours) at 03/21/15 0657 Last data filed at 03/21/15 0500  Gross per 24 hour  Intake   1340 ml  Output   5301 ml  Net  -3961 ml    LABS: Basic Metabolic Panel:  Recent Labs  03/20/15 0356 03/21/15 0406  NA 139 136  K 4.9 3.7  CL 101 93*  CO2 27 28  GLUCOSE 193* 213*  BUN 43* 48*  CREATININE 2.05* 2.24*  CALCIUM 9.8 9.9   Liver Function Tests:  Recent Labs  03/19/15 1524  AST 31  ALT 20  ALKPHOS 78  BILITOT 1.1  PROT 6.9  ALBUMIN 3.7   No results for input(s): LIPASE, AMYLASE in the last 72  hours. CBC:  Recent Labs  03/19/15 1813 03/21/15 0406  WBC 5.8 4.8  HGB 10.4* 10.9*  HCT 33.3* 35.3*  MCV 89.0 88.5  PLT 106* PENDING   Cardiac Enzymes: No results for input(s): CKTOTAL, CKMB, CKMBINDEX, TROPONINI in the last 72 hours. BNP: Invalid input(s): POCBNP D-Dimer: No results for input(s): DDIMER in the last 72 hours. Hemoglobin A1C: No results for input(s): HGBA1C in the last 72 hours. Fasting Lipid Panel: No results for input(s): CHOL, HDL, LDLCALC, TRIG, CHOLHDL, LDLDIRECT in the last 72 hours. Thyroid Function Tests:  Recent Labs  03/19/15 1525  TSH 3.521   Anemia Panel: No results for input(s): VITAMINB12, FOLATE, FERRITIN, TIBC, IRON, RETICCTPCT in the last 72 hours.  RADIOLOGY: US Abdomen Limited  03/20/2015   CLINICAL DATA:  Evaluate for ascites in order to perform paracentesis. Cirrhosis.  EXAM: LIMITED ABDOMEN ULTRASOUND FOR ASCITES  TECHNIQUE: Limited ultrasound survey for ascites was performed in all four abdominal quadrants.  COMPARISON:  09/23/2014  FINDINGS: No significant ascites identified.  IMPRESSION: No evidence of ascites.   Electronically Signed   By: Abigail Miyamoto M.D.   On:  03/20/2015 12:27    PHYSICAL EXAM General: NAD Neck: JVP 9 cm, no thyromegaly or thyroid nodule.  Lungs: Decreased breath sounds at bases. CV: Nondisplaced PMI.  Heart regular S1/S2, no S3/S4, no murmur.  no ankle edema.  Abdomen: Soft, nontender, no hepatosplenomegaly, obese Neurologic: Alert and oriented x 3.  Psych: Normal affect. Extremities: No clubbing or cyanosis.   TELEMETRY: Reviewed telemetry pt v-paced at 22, underlying atrial fibrillation.   ASSESSMENT AND PLAN: 1. Acute on chronic diastolic CHF: EF 01-60% (07/930). NYHA class IIIb-IV symptoms, marked volume overload on exam when seen in office 8/25 so admitted for diuresis. Part of the problem is a very high sodium diet. He diuresed well again yesterday on Lasix gtt, weight down 18 pounds in 2  days.  Creatinine up a bit again.  - CVP still up slightly but creatinine on rise. Will switch back to po torsemide and follow. Possibly home in am.  - Echo with stable EF this admit 2. Chronic atrial fibrillation: CHADSVASC 3 - CHF, HTN, DM2. 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 We tried ASA, however it was stopped again due to GIB. HR was very difficult to control, concerned that elevated HR contributed to CHF and exercise intolerance. Therefore, he has had AV nodal ablation with dual chamber pacing.  - Can continue Toprol XL - Can stop verapamil today 3. CKD stage III: Mild increase in creatinine. Switching back to po demadex. 4. Bioprosthetic AVR: 8/15 echo showed stable bioprosthetic aortic valve. Repeat echo this admission.  5. CAD: s/p CABG. No chest pain. Continue statin, BB. He has been off aspirin and anticoagulation with history of GI bleeding.  6. Cirrhosis: NASH-related. Sees Dr. Laural Golden. Abdominal US 8/26  no ascites. 7. Gout: Continues on allopurinol.  8. Leg pain: Had ABI 5/16--> Normal.   Glori Bickers MD 03/21/2015 6:57 AM

## 2015-03-21 NOTE — Progress Notes (Signed)
Patient refused CPAP for tonight 

## 2015-03-22 ENCOUNTER — Other Ambulatory Visit: Payer: Self-pay | Admitting: Physician Assistant

## 2015-03-22 DIAGNOSIS — N183 Chronic kidney disease, stage 3 unspecified: Secondary | ICD-10-CM

## 2015-03-22 LAB — BASIC METABOLIC PANEL
Anion gap: 14 (ref 5–15)
BUN: 57 mg/dL — AB (ref 6–20)
CO2: 28 mmol/L (ref 22–32)
Calcium: 9.4 mg/dL (ref 8.9–10.3)
Chloride: 94 mmol/L — ABNORMAL LOW (ref 101–111)
Creatinine, Ser: 2.42 mg/dL — ABNORMAL HIGH (ref 0.61–1.24)
GFR calc Af Amer: 30 mL/min — ABNORMAL LOW (ref >60)
GFR, EST NON AFRICAN AMERICAN: 26 mL/min — AB (ref >60)
GLUCOSE: 221 mg/dL — AB (ref 65–99)
POTASSIUM: 3.8 mmol/L (ref 3.5–5.1)
Sodium: 136 mmol/L (ref 135–145)

## 2015-03-22 LAB — GLUCOSE, CAPILLARY
Glucose-Capillary: 251 mg/dL — ABNORMAL HIGH (ref 65–99)
Glucose-Capillary: 262 mg/dL — ABNORMAL HIGH (ref 65–99)

## 2015-03-22 MED ORDER — METOLAZONE 2.5 MG PO TABS: ORAL_TABLET | ORAL | Status: DC

## 2015-03-22 NOTE — Progress Notes (Signed)
Patient ID: [name], male   DOB: 14-Oct-1945, 69 y.o.   MRN: [id number]   SUBJECTIVE:   Weight down a pound. Creatinine up slightly to 2.4   Echo 03/20/15 EF 55-60%  RVSP 9mmHg  Scheduled Meds: . allopurinol  100 mg Oral Daily  . atorvastatin  20 mg Oral q1800  . colchicine  0.6 mg Oral BID  . enoxaparin (LOVENOX) injection  40 mg Subcutaneous Q24H  . famotidine  20 mg Oral QHS  . ferrous sulfate  325 mg Oral BID WC  . insulin aspart  0-15 Units Subcutaneous TID WC  . insulin aspart  0-5 Units Subcutaneous QHS  . metoprolol succinate  100 mg Oral BID  . pantoprazole  40 mg Oral Daily  . potassium chloride  40 mEq Oral Daily  . sodium chloride  3 mL Intravenous Q12H  . spironolactone  12.5 mg Oral Daily  . torsemide  120 mg Oral Daily  . torsemide  60 mg Oral Daily   Continuous Infusions:   PRN Meds:.sodium chloride, acetaminophen, ipratropium-albuterol, ondansetron (ZOFRAN) IV, sodium chloride    Filed Vitals:   03/21/15 0948 03/21/15 1420 03/21/15 2041 03/22/15 0507  BP: 116/65 109/64 123/61 110/87  Pulse: 70 71 74 77  Temp: 98.4 F (36.9 C) 97.9 F (36.6 C) 98.1 F (36.7 C) 97.4 F (36.3 C)  TempSrc: Oral Oral Oral Oral  Resp: 20 20 18 18   Height:      Weight:    123.016 kg (271 lb 3.2 oz)  SpO2: 96% 98% 98% 98%    Intake/Output Summary (Last 24 hours) at 03/22/15 1333 Last data filed at 03/22/15 0948  Gross per 24 hour  Intake   1200 ml  Output   1475 ml  Net   -275 ml    LABS: Basic Metabolic Panel:  Recent Labs  03/21/15 0406 03/22/15 0452  NA 136 136  K 3.7 3.8  CL 93* 94*  CO2 28 28  GLUCOSE 213* 221*  BUN 48* 57*  CREATININE 2.24* 2.42*  CALCIUM 9.9 9.4   Liver Function Tests:  Recent Labs  03/19/15 1524  AST 31  ALT 20  ALKPHOS 78  BILITOT 1.1  PROT 6.9  ALBUMIN 3.7   No results for input(s): LIPASE, AMYLASE in the last 72 hours. CBC:  Recent Labs  03/19/15 1813 03/21/15 0406  WBC 5.8 4.8  HGB 10.4* 10.9*  HCT  33.3* 35.3*  MCV 89.0 88.5  PLT 106* 126*   Cardiac Enzymes: No results for input(s): CKTOTAL, CKMB, CKMBINDEX, TROPONINI in the last 72 hours. BNP: Invalid input(s): POCBNP D-Dimer: No results for input(s): DDIMER in the last 72 hours. Hemoglobin A1C:  Recent Labs  03/20/15 1149  HGBA1C 7.6*   Fasting Lipid Panel: No results for input(s): CHOL, HDL, LDLCALC, TRIG, CHOLHDL, LDLDIRECT in the last 72 hours. Thyroid Function Tests:  Recent Labs  03/19/15 1525  TSH 3.521   Anemia Panel: No results for input(s): VITAMINB12, FOLATE, FERRITIN, TIBC, IRON, RETICCTPCT in the last 72 hours.  RADIOLOGY: US Abdomen Limited  03/20/2015   CLINICAL DATA:  Evaluate for ascites in order to perform paracentesis. Cirrhosis.  EXAM: LIMITED ABDOMEN ULTRASOUND FOR ASCITES  TECHNIQUE: Limited ultrasound survey for ascites was performed in all four abdominal quadrants.  COMPARISON:  09/23/2014  FINDINGS: No significant ascites identified.  IMPRESSION: No evidence of ascites.   Electronically Signed   By: Abigail Miyamoto M.D.   On: 03/20/2015 12:27    PHYSICAL EXAM General:  NAD Neck: JVP 6 cm, no thyromegaly or thyroid nodule.  Lungs: Decreased breath sounds at bases. CV: Nondisplaced PMI.  Heart regular S1/S2, no S3/S4, no murmur.  no ankle edema.  Abdomen: Soft, nontender, no hepatosplenomegaly, obese Neurologic: Alert and oriented x 3.  Psych: Normal affect. Extremities: No clubbing or cyanosis.   TELEMETRY: Reviewed telemetry pt v-paced at 44, underlying atrial fibrillation.   ASSESSMENT AND PLAN: 1. Acute on chronic diastolic CHF: EF 30-09% (08/3298). NYHA class IIIb-IV symptoms, marked volume overload on exam when seen in office 8/25 so admitted for diuresis. Part of the problem is a very high sodium diet.  - Echo with stable EF this admit 2. Chronic atrial fibrillation: CHADSVASC 3 - CHF, HTN, DM2. He is not anticoagulated (was on coumadin in past) due to history of GI bleeding from  AVMs. We tried ASA, however it was stopped again due to GIB. HR was very difficult to control, concerned that elevated HR contributed to CHF and exercise intolerance. Therefore, he has had AV nodal ablation with dual chamber pacing.  - Can continue Toprol XL -Verapamil off 3. CKD stage III: Mild increase in creatinine. Will follow  4. Bioprosthetic AVR: 8/15 echo showed stable bioprosthetic aortic valve. Repeat echo this admission.  5. CAD: s/p CABG. No chest pain. Continue statin, BB. He has been off aspirin and anticoagulation with history of GI bleeding.  6. Cirrhosis: NASH-related. Sees Dr. Laural Golden. Abdominal US 8/26  no ascites. 7. Gout: Continues on allopurinol.  8. Leg pain: Had ABI 5/16--> Normal.   Looks good. Can go home today on previous regimen of torsemide 120/60. Metolazone as needed for weight gain. BMET this week. (wed or thurs)  Please arrange f/u for 2 weeks in HF Clinic.    Glori Bickers MD 03/22/2015 1:33 PM

## 2015-03-22 NOTE — Discharge Summary (Signed)
Physician Discharge Summary     Cardiologist: McLean/Klein  Patient ID: [name] MRN: [id number] DOB/AGE: 03-03-1946 69 y.o.  Admit date: 03/19/2015 Discharge date: 03/22/2015  Admission Diagnoses:   Acute on chronic diastolic CHF (congestive heart failure)  Discharge Diagnoses:  Active Problems:   Acute on chronic diastolic CHF (congestive heart failure)   Acute on chronic renal failure, stage III   chronic atrial fibrillation, CHADSVASC 3   Bioprosthetic aortic valve   Coronary artery disease status post coronary artery bypass grafting   Cirrhosis and ASH   Gout   Leg pain  Discharged Condition: stable  Hospital Course:  Mr Corigliano is a 69 yo with history of CAD s/p CABG, permanent atrial fibrillation, chronic diastolic CHF, severe AS s/p bioprosthetic AVR presents for evaluation of diastolic CHF. Last LHC in 8/13 showed patent grafts. AVR was in 10/13. Last echo in 8/15 showed EF 55-60%, mild LVH, bioprosthetic AV with mean gradient 16, mild to moderate MR, PASP 46 mmHg. [name]   RHC (12/2013): RA 14 RV 44/4/11 PA 50/23 (33) PCWP 20, v = 35 Fick CO/CI: 6.0/2.6 PVR 2.2 WU PA 59%  July 2015: Admitted to the hospital for GI bleed requiring 2 PRBCs. No additional GI work-up with recent EGD showing gastritis.  He had an AV nodal ablation in 9/15 with significant improvement after this. He has St [employer] PPM.   He returned for HF follow up. His weight was up 18 lbs and he was markedly short of breath; dyspneic just walking around his house. This bad x 2-3 weeks but building up over months. He is orthopneic, had to sleep at 45 degree angle. Abdomen was distended. Had abdominal US in 7/16 with no ascites. He has been using his CPAP. Of note, daughter died of cancer about 2 wks ago. Follows high sodium diet, eats pickles frequently.   Patient was admitted for IV diuresis. He was started on Lasix drip at 12 mg per hour. This resulted in 6.7 L of net fluid loss and  improvement in symptoms.  Low sodium diet daily weight monitoring was discussed again.  He had another echocardiogram which revealed an ejection fraction of 55-60% with mild normal wall motion.  Diastolic dysfunction could not be evaluated. His bioprosthetic aortic valve appeared to be functioning normally. Left atrium severely dilated his peak.  PA pressures 44 mmHg.  Valsartan was held all he was admitted. However, blood pressure at discharge was 110/87 and this will be held until outpatient follow-up. Patient is not anticoagulated for his atrial fibrillation due to history of GI bleeding with AVMs. We tried aspirin at one point however that was stopped again due to GI bleeding.  Abdominal ultrasound showed no evidence of ascites.  We'll check a basic metabolic panel on Wednesday or Thursday this coming week. Patient was seen by Dr. Haroldine Laws who thought he was stable for discharge home.  Consults: none  Significant Diagnostic Studies:  Echocardiogram 03/20/2015 Study Conclusions  - Left ventricle: Poor acoustical windows limits assessment of wall motion, especially at the apex. Recommend repeat limited study with definity contrast. The cavity size was normal. There was moderate concentric hypertrophy. Systolic function was normal. The estimated ejection fraction was in the range of 55% to 60%. Wall motion was normal; there were no regional wall motion abnormalities. The study is not technically sufficient to allow evaluation of LV diastolic function. Doppler parameters are consistent with high ventricular filling pressure. - Aortic valve: A bioprosthesis was present and functioning normally. -  Mitral valve: There was mild regurgitation. - Left atrium: The atrium was severely dilated. - Right atrium: The atrium was mildly dilated. - Pulmonary arteries: PA peak pressure: 44 mm Hg (S).  Impressions:  - The right ventricular systolic pressure was increased  consistent with moderate pulmonary hypertension.   EXAM: LIMITED ABDOMEN ULTRASOUND FOR ASCITES  TECHNIQUE: Limited ultrasound survey for ascites was performed in all four abdominal quadrants.  COMPARISON: 09/23/2014  FINDINGS: No significant ascites identified.  IMPRESSION: No evidence of ascites.  Treatments: See above  Discharge Exam: Blood pressure 110/87, pulse 77, temperature 97.4 F (36.3 C), temperature source Oral, resp. rate 18, height 5\' 8"  (1.727 m), weight 271 lb 3.2 oz (123.016 kg), SpO2 98 %.   Disposition: 01-Home or Self Care      Discharge Instructions    Diet - low sodium heart healthy    Complete by:  As directed      Discharge instructions    Complete by:  As directed   Monitor your weight every morning.  If you gain 3 pounds in 24 hours, or 5 pounds in a week, take the metolazone 2.5 mg 30 minutes before your morning torsemide dose.  Continue to monitor your weight until he returns to baseline.     Increase activity slowly    Complete by:  As directed             Medication List    STOP taking these medications        valsartan 160 MG tablet  Commonly known as:  DIOVAN     verapamil 240 MG 24 hr capsule  Commonly known as:  VERELAN PM      TAKE these medications        acetaminophen 500 MG tablet  Commonly known as:  TYLENOL  Take 2,000 mg by mouth at bedtime as needed for mild pain or headache.     allopurinol 100 MG tablet  Commonly known as:  ZYLOPRIM  TAKE 1 TABLET (100 MG TOTAL) BY MOUTH DAILY.     atorvastatin 20 MG tablet  Commonly known as:  LIPITOR  Take 20 mg by mouth daily at 6 PM.     benzonatate 200 MG capsule  Commonly known as:  TESSALON  Take 1 capsule (200 mg total) by mouth 3 (three) times daily as needed for cough.     colchicine 0.6 MG tablet  Take 0.6 mg by mouth daily.     [facility] SPECTRAVITE ADULT 50+ PO  Take 1 tablet by mouth daily.     ferrous gluconate 240 (27 FE) MG tablet  Commonly known  as:  FERGON  Take 240 mg by mouth 2 (two) times daily.     gabapentin 300 MG capsule  Commonly known as:  NEURONTIN  Take 300 mg by mouth 3 (three) times daily.     insulin NPH Human 100 UNIT/ML injection  Commonly known as:  HUMULIN N,NOVOLIN N  Inject 30 Units into the skin 2 (two) times daily before a meal.     insulin regular 100 units/mL injection  Commonly known as:  NOVOLIN R,HUMULIN R  Inject 20 Units into the skin 2 (two) times daily before a meal. Per sliding scale     ipratropium 0.06 % nasal spray  Commonly known as:  ATROVENT  Place 2 sprays into both nostrils 4 (four) times daily.     metolazone 2.5 MG tablet  Commonly known as:  ZAROXOLYN  Take 2.5 mg as needed only  once per day if he gained 3 pounds in 24 hours or 5 pounds in a week.     metoprolol succinate 100 MG 24 hr tablet  Commonly known as:  TOPROL-XL  TAKE 1 TABLET BY MOUTH TWICE DAILY WITH OR IMMEDIATELY FOLLOWING A MEAL     omeprazole 20 MG capsule  Commonly known as:  PRILOSEC  Take 1 capsule (20 mg total) by mouth 2 (two) times daily before a meal.     potassium chloride 10 MEQ tablet  Commonly known as:  KLOR-CON 10  Take 6 tablets (60 mEq total) by mouth 2 (two) times daily. Take extra 40 meq with Metolazone.     sertraline 50 MG tablet  Commonly known as:  ZOLOFT  Take 50 mg by mouth daily.     spironolactone 25 MG tablet  Commonly known as:  ALDACTONE  TAKE 1/2 TABLET BY MOUTH EVERY DAY     torsemide 20 MG tablet  Commonly known as:  DEMADEX  TAKE 6 TABLETS IN THE MORNING AND 3 TABLETS IN THE EVENING     vitamin C 500 MG tablet  Commonly known as:  ASCORBIC ACID  Take 500 mg by mouth 2 (two) times daily.       Follow-up Information    Follow up with Glori Bickers, MD.   Specialty:  Cardiology   Why:  The office scheduler will call you with your follow up appointment date and time   Contact information:   [address] [place] [address] [phone]        Follow up with Labs On 03/26/2015.   Why:  Please go to the lab at our [facility] office, at any time, so we may check your kidney function.   Contact information:   [address] [place] [address] [phone]     Greater than 30 minutes was spent completing the patient's discharge.    SignedTarri Fuller, [place] 03/22/2015, 3:06 PM  Agree with above. He is ready for d/c with close f/u in HF Clinic. See my rounding note from today for further details.   Phoua Hoadley,MD 11:33 PM

## 2015-03-22 NOTE — Progress Notes (Signed)
Discharge instructions given. Pt verbalized understanding and all questions were answered.  

## 2015-03-27 ENCOUNTER — Telehealth (HOSPITAL_COMMUNITY): Payer: Self-pay | Admitting: Pharmacist

## 2015-03-27 NOTE — Telephone Encounter (Signed)
Spoke with Mr. Roots about approval of [employer] assistance for his HF medications. Also relayed the information to [facility] in [place].   [employer] copay assistance information:  Balance: $1500 Eligibility: 03/27/2015-03/25/2016 Billing ID: [id number] Person Code: [id number] RX Group: [id number] RX BIN: [id number] PCN: [id number]    [name], PharmD, BCPS, CPP Clinical Pharmacist Pager: [phone] Phone: [phone] 03/27/2015 1:44 PM

## 2015-04-07 ENCOUNTER — Ambulatory Visit (HOSPITAL_COMMUNITY)
Admission: RE | Admit: 2015-04-07 | Discharge: 2015-04-07 | Disposition: A | Discharge status: Home / Self Care | Payer: Medicare Other | Source: Ambulatory Visit | Attending: Cardiology | Admitting: Cardiology

## 2015-04-07 ENCOUNTER — Other Ambulatory Visit (HOSPITAL_COMMUNITY): Payer: Self-pay | Admitting: Internal Medicine

## 2015-04-07 VITALS — BP 108/70 | HR 83 | Wt 276.2 lb

## 2015-04-07 DIAGNOSIS — I5032 Chronic diastolic (congestive) heart failure: Secondary | ICD-10-CM | POA: Diagnosis not present

## 2015-04-07 DIAGNOSIS — I2510 Atherosclerotic heart disease of native coronary artery without angina pectoris: Secondary | ICD-10-CM | POA: Insufficient documentation

## 2015-04-07 DIAGNOSIS — I509 Heart failure, unspecified: Secondary | ICD-10-CM

## 2015-04-07 DIAGNOSIS — K7460 Unspecified cirrhosis of liver: Secondary | ICD-10-CM | POA: Insufficient documentation

## 2015-04-07 DIAGNOSIS — Z9889 Other specified postprocedural states: Secondary | ICD-10-CM

## 2015-04-07 DIAGNOSIS — N183 Chronic kidney disease, stage 3 (moderate): Secondary | ICD-10-CM | POA: Diagnosis not present

## 2015-04-07 DIAGNOSIS — Z951 Presence of aortocoronary bypass graft: Secondary | ICD-10-CM | POA: Diagnosis not present

## 2015-04-07 DIAGNOSIS — I482 Chronic atrial fibrillation: Secondary | ICD-10-CM | POA: Diagnosis not present

## 2015-04-07 DIAGNOSIS — M79606 Pain in leg, unspecified: Secondary | ICD-10-CM | POA: Diagnosis not present

## 2015-04-07 DIAGNOSIS — M109 Gout, unspecified: Secondary | ICD-10-CM | POA: Diagnosis not present

## 2015-04-07 LAB — BASIC METABOLIC PANEL
Anion gap: 14 (ref 5–15)
BUN: 81 mg/dL — ABNORMAL HIGH (ref 6–20)
CHLORIDE: 93 mmol/L — AB (ref 101–111)
CO2: 27 mmol/L (ref 22–32)
Calcium: 9.7 mg/dL (ref 8.9–10.3)
Creatinine, Ser: 2.22 mg/dL — ABNORMAL HIGH (ref 0.61–1.24)
GFR calc Af Amer: 34 mL/min — ABNORMAL LOW (ref >60)
GFR calc non Af Amer: 29 mL/min — ABNORMAL LOW (ref >60)
GLUCOSE: 325 mg/dL — AB (ref 65–99)
POTASSIUM: 4.3 mmol/L (ref 3.5–5.1)
Sodium: 134 mmol/L — ABNORMAL LOW (ref 135–145)

## 2015-04-07 LAB — BRAIN NATRIURETIC PEPTIDE: B Natriuretic Peptide: 169.1 pg/mL — ABNORMAL HIGH (ref 0.0–100.0)

## 2015-04-07 MED ORDER — METOLAZONE 2.5 MG PO TABS: ORAL_TABLET | ORAL | Status: DC

## 2015-04-07 NOTE — Progress Notes (Signed)
Patient ID: [name], male   DOB: 1945/09/29, 69 y.o.   MRN: [id number] GI: Dr Laural Golden- followed for cirrhosis Pulmonary: Dr Melvyn Novas  PCP: Marjean [facility] at [facility])  Dr. Chalmers Cater (Endocrinologist)  [name] is a 69 yo with history of CAD s/p CABG, permanent atrial fibrillation, chronic diastolic CHF, severe AS s/p bioprosthetic AVR presents for evaluation of diastolic CHF.  Last LHC in 8/13 showed patent grafts.  AVR was in 10/13.  Last echo in 8/15 showed EF 55-60%, mild LVH, bioprosthetic AV with mean gradient 16, mild to moderate Matthew, PASP 46 mmHg. [name]    RHC (12/2013): RA 14 RV 44/4/11 PA 50/23 (33) PCWP 20, v = 35 Fick CO/CI: 6.0/2.6 PVR 2.2 WU PA 59%  July 2015:  Admitted to the hospital for GI bleed requiring 2 PRBCs. No additional GI work-up with recent EGD showing gastritis.  He had an AV nodal ablation in 9/15 with significant improvement after this.  He has St Jude PPM.   At last appointment, weight was up and he had CHF exacerbation with markedly distended abdomen.  He was admitted in 8/16 for diuresis.  Abdominal US showed no ascites.  He lost weight and is doing better.  He is mildly short of breath walking 50 yards, which is a considerable improvement compared to last appointment.  No orthopnea/PND.  No chest pain. Weight is down 18 lbs compared to prior appointment.  However, it is up 5 lbs since leaving the hospital.    Labs (8/14) LDL 38 Labs (3/15) K 3.4, creatinine 1.7, BUN 55  Labs (10/15/13) K 4.0 Creatinine 1.6 BUN 32 Labs (10/29/13) K 4.0 Creatinine 1.6  Labs (4/15) K 4.2 Creatinine 1.32 => 1.5, HCT 35.2 Labs (12/11/13) K 3.6 Creatinine 1.56 Pro BNP 1152 Hemoglobin 9.6  Labs (01/12/14) K 3.0, creatinine 2.0, hemoglobin 8.8 Labs (01/31/14) K 3.3, creatinine 1.49 Labs (8/15) K 3.9, creatinine 1.89 Labs (9/15): K 3.7, creatinine 1.6, AST 48, ALT 52, TSH normal Labs (10/15): K 3.5, creatinine 1.58, BNP 780 Labs (3/16): K 3.5, creatinine 1.6 Labs 12/03/2014: K 4.4  Creatinine 1.55 Labs 12/12/2014: K 3.7 Creatinine 1.48, LDL 50 Labs 8/16: K 3.8, creatinine 2.42  PMH: 1. CAD: s/p CABG 2004. Last LHC (8/13) with patent free radial-OM, patent SVG-D, patent LIMA-LAD.  2. Permanent atrial fibrillation: Not anticoagulated due to GI bleeding.  Holter monitor (4/15) with mean HR 115 (afib).   He had AV nodal ablation with St Jude dual chamber PPM  3. Chronic diastolic CHF: Echo (6/06) with EF 55-60%, bioprosthetic aortic valve with mean gradient 22 mmHg, mildly decreased RV systolic function. RHC (12/2013): RA 14, RV 44/4/11, PA 50/23 (33), PCWP 20, v = 35, Fick CO/CI: 6.0/2.6, PVR 2.2 WU, PA 59%.  Echo (8/15) with EF 55-60%, mild LVH, bioprosthetic aortic valve with mean gradient 16 mmHg, PA systolic pressure 46 mmHg, mild to moderate Matthew.  Echo (8/16) with EF 55-60%, normal bioprosthetic aortic valve, mild Matthew, PASP 44 mmHg.  4. Severe aortic stenosis s/p bioprosthetic AVR in 10/13. Mean gradient 22 mmHg across valve on echo in 8/14.  Mean gradient 16 mmHg on echo in 8/15.  5. NASH: ascites, thrombocytopenia.  Had paracentesis in 1/15. Gynecomastia with spironolactone.  6. 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  EGD in 1/15 showed mild portal gastropathy and 2 small antral ulcers. Recurrent GI bleed in 6/15, EGD showed gastritis and ?GAVE.  7. Hyperlipidemia 8. HTN 9. GERD 10. COPD 36. OSA: Cannot tolerate CPAP.  12. CKD 13. Gout 14. Carotid dopplers (11/15) with minimal stenosis.  15. ABI 12/11/2014: normal    SH: Lives with wife in [place], prior smoker.   FH: CAD  ROS: All systems reviewed and negative except as per HPI.   Current Outpatient Prescriptions on File Prior to Encounter  Medication Sig Dispense Refill  . acetaminophen (TYLENOL) 500 MG tablet Take 2,000 mg by mouth at bedtime as needed for mild pain or headache.    . allopurinol (ZYLOPRIM) 100 MG tablet TAKE 1 TABLET (100 MG TOTAL) BY MOUTH DAILY. 30 tablet 6  . atorvastatin (LIPITOR) 20 MG  tablet Take 20 mg by mouth daily at 6 PM.     . benzonatate (TESSALON) 200 MG capsule Take 1 capsule (200 mg total) by mouth 3 (three) times daily as needed for cough. 40 capsule 1  . colchicine 0.6 MG tablet Take 0.6 mg by mouth daily.     . ferrous gluconate (FERGON) 240 (27 FE) MG tablet Take 240 mg by mouth 2 (two) times daily.    [name] gabapentin (NEURONTIN) 300 MG capsule Take 300 mg by mouth 3 (three) times daily.  6  . insulin NPH Human (HUMULIN N,NOVOLIN N) 100 UNIT/ML injection Inject 30 Units into the skin 2 (two) times daily before a meal.     . insulin regular (NOVOLIN R,HUMULIN R) 100 units/mL injection Inject 20 Units into the skin 2 (two) times daily before a meal. Per sliding scale    . metoprolol succinate (TOPROL-XL) 100 MG 24 hr tablet TAKE 1 TABLET BY MOUTH TWICE DAILY WITH OR IMMEDIATELY FOLLOWING A MEAL 60 tablet 6  . Multiple Vitamins-Minerals ([employer]S SPECTRAVITE ADULT 50+ PO) Take 1 tablet by mouth daily.    [name] omeprazole (PRILOSEC) 20 MG capsule Take 1 capsule (20 mg total) by mouth 2 (two) times daily before a meal. 30 capsule 5  . potassium chloride (KLOR-CON 10) 10 MEQ tablet Take 6 tablets (60 mEq total) by mouth 2 (two) times daily. Take extra 40 meq with Metolazone. 360 tablet 3  . sertraline (ZOLOFT) 50 MG tablet Take 50 mg by mouth daily.    [name] spironolactone (ALDACTONE) 25 MG tablet TAKE 1/2 TABLET BY MOUTH EVERY DAY 15 tablet 5  . torsemide (DEMADEX) 20 MG tablet TAKE 6 TABLETS IN THE MORNING AND 3 TABLETS IN THE EVENING 270 tablet 3  . vitamin C (ASCORBIC ACID) 500 MG tablet Take 500 mg by mouth 2 (two) times daily.     No current facility-administered medications on file prior to encounter.    Filed Vitals:   04/07/15 1045  BP: 108/70  Pulse: 83  Weight: 276 lb 3.2 oz (125.283 kg)  SpO2: 96%    General: NAD. Wife present Neck: JVP 8-9, no thyromegaly or thyroid nodule.  Lungs: Clear to auscultation bilaterally with normal respiratory effort. CV: Nondisplaced  PMI.  Irregular S1/S2, no S3/S4, 2/6 early SEM RUSB.  No edema.  Left carotid bruit.  1+ PT pulses bilaterally.  Abdomen: Soft, obese nontender, no hepatosplenomegaly.  Mildly distended.  Skin: Intact without lesions or rashes.  Neurologic: Alert and oriented x 3.  Psych: Normal affect. Extremities: No clubbing or cyanosis.   Assessment/Plan: 1. Chronic diastolic CHF:  EF 29-51% (02/2015).  NYHA class II-III symptoms.  He is doing better since last hospitalization but still has some volume overload.  - Continue torsemide 120 qam/60 qpm.  - Given some volume overload, I will increase metolazone to 2.5 mg twice weekly.  -  Send BMET/BNP today.  2. Chronic atrial fibrillation:  CHADSVASC 3 - CHF, HTN, DM2. 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 We tried ASA, however it was stopped again due to GIB. HR was very difficult to control, concerned that elevated HR contributed to CHF and exercise intolerance.  Therefore, he had AV nodal ablation with dual chamber pacing.   - Continue Toprol XL. - He can stop verapamil.  3. CKD stage III: BMET today.  If creatinine is high, may need to cut back/stop valsartan.  4. Bioprosthetic AVR: 8/16 echo showed stable bioprosthetic aortic valve.   5. CAD: s/p CABG. No chest pain. Continue statin, BB. He has been off aspirin and anticoagulation with history of GI bleeding.  6. Cirrhosis: NASH-related. Sees Dr. Laural Golden. No ascites on recent abdominal US.  7. Gout: Continues on allopurinol.   8. Leg pain:  Had ABI 5/16--> Normal.     [name] 04/07/2015

## 2015-04-07 NOTE — Patient Instructions (Signed)
Please take the Metolazone 30 minutes before the Torsemide on Wednesday and Friday am.  Stop the Verapamil.  Today BMP and BNP   Follow up in 2 weeks with NP

## 2015-04-07 NOTE — Progress Notes (Signed)
Advanced Heart Failure Medication Review by a Pharmacist  Does the patient  feel that his/her medications are working for him/her?  yes  Has the patient been experiencing any side effects to the medications prescribed?  no  Does the patient measure his/her own blood pressure or blood glucose at home?  yes   Does the patient have any problems obtaining medications due to transportation or finances?   no  Understanding of regimen: good Understanding of indications: good Potential of compliance: good    Pharmacist comments:  [name] is a pleasant 69 yo M presenting with his wife and all of his medication bottles. He has a great understanding of his regimen and the importance of consistent use. I recently enrolled him in the [employer] foundation for coverage of his HF medications which have gone through for a $0 copay for him. He did not have any specific medication-related questions or concerns at this time.   [name], PharmD, BCPS, CPP Clinical Pharmacist Pager: [phone] Phone: [phone] 04/07/2015 11:06 AM

## 2015-04-13 ENCOUNTER — Other Ambulatory Visit (HOSPITAL_COMMUNITY): Payer: Self-pay | Admitting: Internal Medicine

## 2015-04-16 ENCOUNTER — Encounter: Payer: Self-pay | Admitting: Internal Medicine

## 2015-04-16 ENCOUNTER — Ambulatory Visit (INDEPENDENT_AMBULATORY_CARE_PROVIDER_SITE_OTHER): Payer: Medicare Other | Admitting: *Deleted

## 2015-04-16 DIAGNOSIS — I495 Sick sinus syndrome: Secondary | ICD-10-CM

## 2015-04-16 NOTE — Progress Notes (Signed)
Remote pacemaker transmission.   

## 2015-04-20 ENCOUNTER — Other Ambulatory Visit (HOSPITAL_COMMUNITY): Payer: Self-pay | Admitting: Cardiology

## 2015-04-20 LAB — CUP PACEART REMOTE DEVICE CHECK
Battery Remaining Longevity: 131 mo
Battery Remaining Percentage: 95.5 %
Battery Voltage: 3.02 V
Lead Channel Pacing Threshold Amplitude: 0.625 V
Lead Channel Pacing Threshold Pulse Width: 0.4 ms
Lead Channel Sensing Intrinsic Amplitude: 12 mV
Lead Channel Setting Pacing Pulse Width: 0.4 ms
Lead Channel Setting Sensing Sensitivity: 6 mV
MDC IDC MSMT LEADCHNL RV IMPEDANCE VALUE: 710 Ohm
MDC IDC PG SERIAL: 3014543
MDC IDC SESS DTM: 20160922072320
MDC IDC SET LEADCHNL RV PACING AMPLITUDE: 0.875
MDC IDC STAT BRADY RV PERCENT PACED: 99 %

## 2015-04-21 ENCOUNTER — Encounter (HOSPITAL_COMMUNITY): Payer: Self-pay

## 2015-04-21 ENCOUNTER — Ambulatory Visit (HOSPITAL_COMMUNITY)
Admission: RE | Admit: 2015-04-21 | Discharge: 2015-04-21 | Disposition: A | Discharge status: Home / Self Care | Payer: Medicare Other | Source: Ambulatory Visit | Attending: Internal Medicine | Admitting: Internal Medicine

## 2015-04-21 VITALS — BP 112/60 | HR 71 | Wt 277.1 lb

## 2015-04-21 DIAGNOSIS — Z79899 Other long term (current) drug therapy: Secondary | ICD-10-CM | POA: Diagnosis not present

## 2015-04-21 DIAGNOSIS — Z951 Presence of aortocoronary bypass graft: Secondary | ICD-10-CM | POA: Insufficient documentation

## 2015-04-21 DIAGNOSIS — Z794 Long term (current) use of insulin: Secondary | ICD-10-CM | POA: Diagnosis not present

## 2015-04-21 DIAGNOSIS — J449 Chronic obstructive pulmonary disease, unspecified: Secondary | ICD-10-CM | POA: Insufficient documentation

## 2015-04-21 DIAGNOSIS — E785 Hyperlipidemia, unspecified: Secondary | ICD-10-CM | POA: Insufficient documentation

## 2015-04-21 DIAGNOSIS — K7460 Unspecified cirrhosis of liver: Secondary | ICD-10-CM | POA: Insufficient documentation

## 2015-04-21 DIAGNOSIS — I2510 Atherosclerotic heart disease of native coronary artery without angina pectoris: Secondary | ICD-10-CM | POA: Insufficient documentation

## 2015-04-21 DIAGNOSIS — K219 Gastro-esophageal reflux disease without esophagitis: Secondary | ICD-10-CM | POA: Diagnosis not present

## 2015-04-21 DIAGNOSIS — M109 Gout, unspecified: Secondary | ICD-10-CM | POA: Diagnosis not present

## 2015-04-21 DIAGNOSIS — I482 Chronic atrial fibrillation, unspecified: Secondary | ICD-10-CM

## 2015-04-21 DIAGNOSIS — I129 Hypertensive chronic kidney disease with stage 1 through stage 4 chronic kidney disease, or unspecified chronic kidney disease: Secondary | ICD-10-CM | POA: Insufficient documentation

## 2015-04-21 DIAGNOSIS — N183 Chronic kidney disease, stage 3 (moderate): Secondary | ICD-10-CM | POA: Diagnosis not present

## 2015-04-21 DIAGNOSIS — I5032 Chronic diastolic (congestive) heart failure: Secondary | ICD-10-CM | POA: Insufficient documentation

## 2015-04-21 DIAGNOSIS — Z953 Presence of xenogenic heart valve: Secondary | ICD-10-CM | POA: Insufficient documentation

## 2015-04-21 DIAGNOSIS — E1122 Type 2 diabetes mellitus with diabetic chronic kidney disease: Secondary | ICD-10-CM | POA: Diagnosis not present

## 2015-04-21 DIAGNOSIS — Z8249 Family history of ischemic heart disease and other diseases of the circulatory system: Secondary | ICD-10-CM | POA: Insufficient documentation

## 2015-04-21 DIAGNOSIS — G4733 Obstructive sleep apnea (adult) (pediatric): Secondary | ICD-10-CM | POA: Diagnosis not present

## 2015-04-21 DIAGNOSIS — Z87891 Personal history of nicotine dependence: Secondary | ICD-10-CM | POA: Insufficient documentation

## 2015-04-21 NOTE — Patient Instructions (Signed)
Follow up 4 weeks

## 2015-04-21 NOTE — Progress Notes (Signed)
Patient ID: [name], male   DOB: Mar 06, 1946, 69 y.o.   MRN: [id number] GI: [name]- followed for cirrhosis Pulmonary: [name]  PCP: [name])  Matthew. Chalmers [name] (Endocrinologist)  [name] is a 69 yo with history of CAD s/p CABG, permanent atrial fibrillation, chronic diastolic CHF, severe AS s/p bioprosthetic AVR presents for evaluation of diastolic CHF.  Last LHC in 8/13 showed patent grafts.  AVR was in 10/13.  Last echo in 8/15 showed EF 55-60%, mild LVH, bioprosthetic AV with mean gradient 16, mild to moderate Matthew, PASP 46 mmHg. [name]    RHC (12/2013): RA 14 RV 44/4/11 PA 50/23 (33) PCWP 20, v = 35 Fick CO/CI: 6.0/2.6 PVR 2.2 WU PA 59%  July 2015:  Admitted to the hospital for GI bleed requiring 2 PRBCs. No additional GI work-up with recent EGD showing gastritis.  He had an AV nodal ablation in 9/15 with significant improvement after this.  He has St Jude PPM.   He returns for HF follow up. Says his breathing is much better. Sleeps with HOB elevated. Weight at home 273- 275 pounds. Says he has good response from metolazone. Has cut back on salt intake but drinking over 140 ounces of fluid. Taking all medications. Not exercising.     Labs (8/14) LDL 38 Labs (3/15) K 3.4, creatinine 1.7, BUN 55  Labs (10/15/13) K 4.0 Creatinine 1.6 BUN 32 Labs (10/29/13) K 4.0 Creatinine 1.6  Labs (4/15) K 4.2 Creatinine 1.32 => 1.5, HCT 35.2 Labs (12/11/13) K 3.6 Creatinine 1.56 Pro BNP 1152 Hemoglobin 9.6  Labs (01/12/14) K 3.0, creatinine 2.0, hemoglobin 8.8 Labs (01/31/14) K 3.3, creatinine 1.49 Labs (8/15) K 3.9, creatinine 1.89 Labs (9/15): K 3.7, creatinine 1.6, AST 48, ALT 52, TSH normal Labs (10/15): K 3.5, creatinine 1.58, BNP 780 Labs (3/16): K 3.5, creatinine 1.6 Labs 12/03/2014: K 4.4 Creatinine 1.55 Labs 12/12/2014: K 3.7 Creatinine 1.48, LDL 50 Labs 8/16: K 3.8, creatinine 2.42 Labs 04/07/2015 K 4.3 Creatinine 2.2   PMH: 1. CAD: s/p CABG 2004. Last LHC (8/13)  with patent free radial-OM, patent SVG-D, patent LIMA-LAD.  2. Permanent atrial fibrillation: Not anticoagulated due to GI bleeding.  Holter monitor (4/15) with mean HR 115 (afib).   He had AV nodal ablation with St Jude dual chamber PPM  3. Chronic diastolic CHF: Echo (8/14) with EF 55-60%, bioprosthetic aortic valve with mean gradient 22 mmHg, mildly decreased RV systolic function. RHC (12/2013): RA 14, RV 44/4/11, PA 50/23 (33), PCWP 20, v = 35, Fick CO/CI: 6.0/2.6, PVR 2.2 WU, PA 59%.  Echo (8/15) with EF 55-60%, mild LVH, bioprosthetic aortic valve with mean gradient 16 mmHg, PA systolic pressure 46 mmHg, mild to moderate Matthew.  Echo (8/16) with EF 55-60%, normal bioprosthetic aortic valve, mild Matthew, PASP 44 mmHg.  4. Severe aortic stenosis s/p bioprosthetic AVR in 10/13. Mean gradient 22 mmHg across valve on echo in 8/14.  Mean gradient 16 mmHg on echo in 8/15.  5. NASH: ascites, thrombocytopenia.  Had paracentesis in 1/15. Gynecomastia with spironolactone.  6. 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  EGD in 1/15 showed mild portal gastropathy and 2 small antral ulcers. Recurrent GI bleed in 6/15, EGD showed gastritis and ?GAVE.  7. Hyperlipidemia 8. HTN 9. GERD 10. COPD 27. OSA: Cannot tolerate CPAP.  12. CKD 13. Gout 14. Carotid dopplers (11/15) with minimal stenosis.  15. ABI 12/11/2014: normal    SH: Lives with wife in [place], prior smoker.  FH: CAD  ROS: All systems reviewed and negative except as per HPI.   Current Outpatient Prescriptions on File Prior to Encounter  Medication Sig Dispense Refill  . acetaminophen (TYLENOL) 500 MG tablet Take 2,000 mg by mouth at bedtime as needed for mild pain or headache.    . allopurinol (ZYLOPRIM) 100 MG tablet TAKE 1 TABLET (100 MG TOTAL) BY MOUTH DAILY. 30 tablet 6  . atorvastatin (LIPITOR) 20 MG tablet Take 20 mg by mouth daily at 6 PM.     . colchicine 0.6 MG tablet Take 0.6 mg by mouth daily.     . ferrous gluconate (FERGON) 240 (27 FE) MG  tablet Take 240 mg by mouth 2 (two) times daily.    [name] gabapentin (NEURONTIN) 300 MG capsule Take 300 mg by mouth 3 (three) times daily.  6  . insulin NPH Human (HUMULIN N,NOVOLIN N) 100 UNIT/ML injection Inject 30 Units into the skin 2 (two) times daily before a meal.     . insulin regular (NOVOLIN R,HUMULIN R) 100 units/mL injection Inject 20 Units into the skin 2 (two) times daily before a meal. Per sliding scale    . ipratropium (ATROVENT) 0.06 % nasal spray Place 2 sprays into both nostrils daily.    [name] KLOR-CON 10 10 MEQ tablet TAKE 6 TABLETS (60 MEQ TOTAL) BY MOUTH 2 (TWO) TIMES DAILY. TAKE EXTRA 40 MEQ WITH METOLAZONE. 360 tablet 3  . metolazone (ZAROXOLYN) 2.5 MG tablet Take one tablet 30 minutes before Torsemide in the am, on Wednesday and Friday only 15 tablet 3  . metoprolol succinate (TOPROL-XL) 100 MG 24 hr tablet TAKE 1 TABLET BY MOUTH TWICE DAILY WITH OR IMMEDIATELY FOLLOWING A MEAL 60 tablet 6  . Multiple Vitamins-Minerals (CVS SPECTRAVITE ADULT 50+ PO) Take 1 tablet by mouth daily.    [name] omeprazole (PRILOSEC) 20 MG capsule Take 1 capsule (20 mg total) by mouth 2 (two) times daily before a meal. 30 capsule 5  . sertraline (ZOLOFT) 50 MG tablet Take 50 mg by mouth daily.    [name] spironolactone (ALDACTONE) 25 MG tablet TAKE 1/2 TABLET BY MOUTH EVERY DAY 15 tablet 5  . torsemide (DEMADEX) 20 MG tablet TAKE 6 TABLETS IN THE MORNING AND 3 TABLETS IN THE EVENING 270 tablet 3  . valsartan (DIOVAN) 160 MG tablet Take 160 mg by mouth daily.    . verapamil (CALAN-SR) 240 MG CR tablet TAKE 1 & 1/2 TABLETS BY MOUTH AT BEDTIME 45 tablet 6  . vitamin C (ASCORBIC ACID) 500 MG tablet Take 500 mg by mouth 2 (two) times daily.    . benzonatate (TESSALON) 200 MG capsule Take 1 capsule (200 mg total) by mouth 3 (three) times daily as needed for cough. (Patient not taking: Reported on 04/21/2015) 40 capsule 1   No current facility-administered medications on file prior to encounter.    Filed Vitals:    04/21/15 1153  BP: 112/60  Pulse: 71  Weight: 277 lb 2 oz (125.703 kg)  SpO2: 98%    General: NAD. Wife present Neck: JVP 7-8, no thyromegaly or thyroid nodule.  Lungs: Clear to auscultation bilaterally with normal respiratory effort. CV: Nondisplaced PMI.  Irregular S1/S2, no S3/S4, 2/6 early SEM RUSB.  No edema.  Left carotid bruit.  1+ PT pulses bilaterally.  Abdomen: Soft, obese nontender, no hepatosplenomegaly.  Mildly distended.  Skin: Intact without lesions or rashes.  Neurologic: Alert and oriented x 3.  Psych: Normal affect. Extremities: No clubbing or cyanosis.  Assessment/Plan: 1. Chronic diastolic CHF:  EF 32-02% (02/2015).  NYHA class II-III symptoms.   - Continue torsemide 120 qam/60 qpm + metolazone twice weekly.  Instructed to limit fluid intake to less than 64 ounces per day.  2. Chronic atrial fibrillation:  CHADSVASC 3 - CHF, HTN, DM2. 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 We tried ASA, however it was stopped again due to GIB. HR was very difficult to control, concerned that elevated HR contributed to CHF and exercise intolerance.  Therefore, he had AV nodal ablation with dual chamber pacing.   - Continue Toprol XL. Rate controlled  3. CKD stage III: most recent renal function stable   4. Bioprosthetic AVR: 8/16 echo showed stable bioprosthetic aortic valve.   5. CAD: s/p CABG. No chest pain. Continue statin, BB. He has been off aspirin and anticoagulation with history of GI bleeding.  6. Cirrhosis: NASH-related. Sees Matthew. Laural [name]. No ascites on recent abdominal US.  7. Gout: Continues on allopurinol.   8. Leg pain:  Had ABI 5/16--> Normal.    Follow up in 1 month.    CLEGG,AMY 04/21/2015

## 2015-05-05 ENCOUNTER — Encounter: Payer: Self-pay | Admitting: Cardiology

## 2015-05-19 ENCOUNTER — Ambulatory Visit (HOSPITAL_COMMUNITY)
Admission: RE | Admit: 2015-05-19 | Discharge: 2015-05-19 | Disposition: A | Discharge status: Home / Self Care | Payer: Medicare Other | Source: Ambulatory Visit | Attending: Internal Medicine | Admitting: Internal Medicine

## 2015-05-19 VITALS — BP 98/52 | HR 69 | Ht 68.0 in | Wt 285.6 lb

## 2015-05-19 DIAGNOSIS — N183 Chronic kidney disease, stage 3 (moderate): Secondary | ICD-10-CM | POA: Insufficient documentation

## 2015-05-19 DIAGNOSIS — K7581 Nonalcoholic steatohepatitis (NASH): Secondary | ICD-10-CM | POA: Diagnosis not present

## 2015-05-19 DIAGNOSIS — M109 Gout, unspecified: Secondary | ICD-10-CM | POA: Diagnosis not present

## 2015-05-19 DIAGNOSIS — I130 Hypertensive heart and chronic kidney disease with heart failure and stage 1 through stage 4 chronic kidney disease, or unspecified chronic kidney disease: Secondary | ICD-10-CM | POA: Diagnosis not present

## 2015-05-19 DIAGNOSIS — Z87891 Personal history of nicotine dependence: Secondary | ICD-10-CM | POA: Diagnosis not present

## 2015-05-19 DIAGNOSIS — E785 Hyperlipidemia, unspecified: Secondary | ICD-10-CM | POA: Insufficient documentation

## 2015-05-19 DIAGNOSIS — Z794 Long term (current) use of insulin: Secondary | ICD-10-CM | POA: Insufficient documentation

## 2015-05-19 DIAGNOSIS — K219 Gastro-esophageal reflux disease without esophagitis: Secondary | ICD-10-CM | POA: Diagnosis not present

## 2015-05-19 DIAGNOSIS — Z8249 Family history of ischemic heart disease and other diseases of the circulatory system: Secondary | ICD-10-CM | POA: Diagnosis not present

## 2015-05-19 DIAGNOSIS — J449 Chronic obstructive pulmonary disease, unspecified: Secondary | ICD-10-CM | POA: Diagnosis not present

## 2015-05-19 DIAGNOSIS — I2510 Atherosclerotic heart disease of native coronary artery without angina pectoris: Secondary | ICD-10-CM | POA: Diagnosis not present

## 2015-05-19 DIAGNOSIS — Z953 Presence of xenogenic heart valve: Secondary | ICD-10-CM | POA: Diagnosis not present

## 2015-05-19 DIAGNOSIS — E1122 Type 2 diabetes mellitus with diabetic chronic kidney disease: Secondary | ICD-10-CM | POA: Diagnosis not present

## 2015-05-19 DIAGNOSIS — I482 Chronic atrial fibrillation: Secondary | ICD-10-CM | POA: Diagnosis not present

## 2015-05-19 DIAGNOSIS — Z9889 Other specified postprocedural states: Secondary | ICD-10-CM

## 2015-05-19 DIAGNOSIS — Z951 Presence of aortocoronary bypass graft: Secondary | ICD-10-CM | POA: Insufficient documentation

## 2015-05-19 DIAGNOSIS — I350 Nonrheumatic aortic (valve) stenosis: Secondary | ICD-10-CM

## 2015-05-19 DIAGNOSIS — I5032 Chronic diastolic (congestive) heart failure: Secondary | ICD-10-CM | POA: Diagnosis not present

## 2015-05-19 DIAGNOSIS — I5033 Acute on chronic diastolic (congestive) heart failure: Secondary | ICD-10-CM

## 2015-05-19 DIAGNOSIS — Z79899 Other long term (current) drug therapy: Secondary | ICD-10-CM | POA: Insufficient documentation

## 2015-05-19 DIAGNOSIS — I4821 Permanent atrial fibrillation: Secondary | ICD-10-CM

## 2015-05-19 LAB — BASIC METABOLIC PANEL
Anion gap: 11 (ref 5–15)
BUN: 79 mg/dL — AB (ref 6–20)
CALCIUM: 9.5 mg/dL (ref 8.9–10.3)
CO2: 26 mmol/L (ref 22–32)
CREATININE: 2.14 mg/dL — AB (ref 0.61–1.24)
Chloride: 100 mmol/L — ABNORMAL LOW (ref 101–111)
GFR calc Af Amer: 35 mL/min — ABNORMAL LOW (ref >60)
GFR, EST NON AFRICAN AMERICAN: 30 mL/min — AB (ref >60)
GLUCOSE: 286 mg/dL — AB (ref 65–99)
Potassium: 4.8 mmol/L (ref 3.5–5.1)
SODIUM: 137 mmol/L (ref 135–145)

## 2015-05-19 LAB — BRAIN NATRIURETIC PEPTIDE: B Natriuretic Peptide: 151.6 pg/mL — ABNORMAL HIGH (ref 0.0–100.0)

## 2015-05-19 NOTE — Patient Instructions (Signed)
Stop the Valsartan  Metolazone 2.5 mg Wednesday, Thursday, and Saturday this week  Then back to Metolazone 2.5 mg every Wednesday and Saturday  Labs today will call if abnormal   Labs in 2 weeks   Follow up in 1 month

## 2015-05-19 NOTE — Progress Notes (Signed)
Advanced Heart Failure Medication Review by a Pharmacist  Does the patient  feel that his/her medications are working for him/her?  yes  Has the patient been experiencing any side effects to the medications prescribed?  no  Does the patient measure his/her own blood pressure or blood glucose at home?  yes   Does the patient have any problems obtaining medications due to transportation or finances?   no  Understanding of regimen: good Understanding of indications: good Potential of compliance: good Patient understands to avoid NSAIDs. Patient understands to avoid decongestants.  Issues to address at subsequent visits: None   Pharmacist comments:  Mr. Enke is a pleasant 69 yo M presenting with his wife and his medication bottles. He reports good compliance with most of his medications but states that he misses his metolazone at least once a week. He did not have any other medication-related questions or concerns for me at this time.  [name], PharmD, BCPS, CPP Clinical Pharmacist Pager: [phone] Phone: [phone] 05/19/2015 11:22 AM    Time with patient: 6 minutes Preparation and documentation time: 2 minutes Total time: 8 minutes

## 2015-05-19 NOTE — Progress Notes (Signed)
Patient ID: [name], male   DOB: 1946-07-09, 69 y.o.   MRN: [id number] GI: Dr Laural Golden- followed for cirrhosis Pulmonary: Dr Melvyn Novas  PCP: Marjean Donna [facility] at [facility])  Dr. Chalmers Cater (Endocrinologist)  Mr Dymek is a 69 yo with history of CAD s/p CABG, permanent atrial fibrillation, chronic diastolic CHF, severe AS s/p bioprosthetic AVR who presents for evaluation of diastolic CHF.  Last LHC in 8/13 showed patent grafts.  AVR was in 10/13.  Last echo in 8/16 showed EF 55-60%, normal bioprosthetic aortic valve.   July 2015:  Admitted to the hospital for GI bleed requiring 2 PRBCs. No additional GI work-up with recent EGD showing gastritis.  He had an AV nodal ablation in 9/15 with significant improvement after this.  He has St Jude PPM.   He was admitted in 8/16 with volume overload and diuresed.    He returns for HF follow up. Breathing overall is ok; he is walking farther.  He is mildly short of breath after walking about 200 feet.  1 episode of lightheadedness with standing but no syncope or fall.  He is only taking metolazone once a week.  He is still taking valsartan (thought he was off this).  Weight is up 8 lbs since last appointment.    Labs (8/14) LDL 38 Labs (3/15) K 3.4, creatinine 1.7, BUN 55  Labs (10/15/13) K 4.0 Creatinine 1.6 BUN 32 Labs (10/29/13) K 4.0 Creatinine 1.6  Labs (4/15) K 4.2 Creatinine 1.32 => 1.5, HCT 35.2 Labs (12/11/13) K 3.6 Creatinine 1.56 Pro BNP 1152 Hemoglobin 9.6  Labs (01/12/14) K 3.0, creatinine 2.0, hemoglobin 8.8 Labs (01/31/14) K 3.3, creatinine 1.49 Labs (8/15) K 3.9, creatinine 1.89 Labs (9/15): K 3.7, creatinine 1.6, AST 48, ALT 52, TSH normal Labs (10/15): K 3.5, creatinine 1.58, BNP 780 Labs (3/16): K 3.5, creatinine 1.6 Labs 12/03/2014: K 4.4 Creatinine 1.55 Labs 12/12/2014: K 3.7 Creatinine 1.48, LDL 50 Labs 8/16: K 3.8, creatinine 2.42 Labs 04/07/2015: K 4.3, Creatinine 2.22, BNP 169  PMH: 1. CAD: s/p CABG 2004. Last LHC (8/13) with patent  free radial-OM, patent SVG-D, patent LIMA-LAD.  2. Permanent atrial fibrillation: Not anticoagulated due to GI bleeding.  Holter monitor (4/15) with mean HR 115 (afib).   He had AV nodal ablation with St Jude dual chamber PPM  3. Chronic diastolic CHF: Echo (6/59) with EF 55-60%, bioprosthetic aortic valve with mean gradient 22 mmHg, mildly decreased RV systolic function. RHC (12/2013): RA 14, RV 44/4/11, PA 50/23 (33), PCWP 20, v = 35, Fick CO/CI: 6.0/2.6, PVR 2.2 WU, PA 59%.  Echo (8/15) with EF 55-60%, mild LVH, bioprosthetic aortic valve with mean gradient 16 mmHg, PA systolic pressure 46 mmHg, mild to moderate MR.  Echo (8/16) with EF 55-60%, normal bioprosthetic aortic valve, mild MR, PASP 44 mmHg.  4. Severe aortic stenosis s/p bioprosthetic AVR in 10/13. Mean gradient 22 mmHg across valve on echo in 8/14.  Mean gradient 16 mmHg on echo in 8/15.  5. NASH: ascites, thrombocytopenia.  Had paracentesis in 1/15. Gynecomastia with spironolactone.  6. 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  EGD in 1/15 showed mild portal gastropathy and 2 small antral ulcers. Recurrent GI bleed in 6/15, EGD showed gastritis and ?GAVE.  7. Hyperlipidemia 8. HTN 9. GERD 10. COPD 41. OSA: Cannot tolerate CPAP.  12. CKD 13. Gout 14. Carotid dopplers (11/15) with minimal stenosis.  15. ABI 12/11/2014: normal    SH: Lives with wife in [place], prior smoker.  FH: CAD  ROS: All systems reviewed and negative except as per HPI.   Current Outpatient Prescriptions on File Prior to Encounter  Medication Sig Dispense Refill  . acetaminophen (TYLENOL) 500 MG tablet Take 2,000 mg by mouth at bedtime as needed for mild pain or headache.    . allopurinol (ZYLOPRIM) 100 MG tablet TAKE 1 TABLET (100 MG TOTAL) BY MOUTH DAILY. 30 tablet 6  . atorvastatin (LIPITOR) 20 MG tablet Take 20 mg by mouth daily at 6 PM.     . colchicine 0.6 MG tablet Take 0.6 mg by mouth daily.     . ferrous gluconate (FERGON) 240 (27 FE) MG tablet Take  240 mg by mouth 2 (two) times daily.    [name] gabapentin (NEURONTIN) 300 MG capsule Take 300 mg by mouth 3 (three) times daily.  6  . insulin NPH Human (HUMULIN N,NOVOLIN N) 100 UNIT/ML injection Inject 30 Units into the skin 2 (two) times daily before a meal.     . insulin regular (NOVOLIN R,HUMULIN R) 100 units/mL injection Inject 20 Units into the skin 2 (two) times daily before a meal. Per sliding scale    . ipratropium (ATROVENT) 0.06 % nasal spray Place 2 sprays into both nostrils daily.    [name] KLOR-CON 10 10 MEQ tablet TAKE 6 TABLETS (60 MEQ TOTAL) BY MOUTH 2 (TWO) TIMES DAILY. TAKE EXTRA 40 MEQ WITH METOLAZONE. 360 tablet 3  . metoprolol succinate (TOPROL-XL) 100 MG 24 hr tablet TAKE 1 TABLET BY MOUTH TWICE DAILY WITH OR IMMEDIATELY FOLLOWING A MEAL 60 tablet 6  . Multiple Vitamins-Minerals (CVS SPECTRAVITE ADULT 50+ PO) Take 1 tablet by mouth daily.    [name] omeprazole (PRILOSEC) 20 MG capsule Take 1 capsule (20 mg total) by mouth 2 (two) times daily before a meal. 30 capsule 5  . sertraline (ZOLOFT) 50 MG tablet Take 50 mg by mouth daily.    [name] spironolactone (ALDACTONE) 25 MG tablet TAKE 1/2 TABLET BY MOUTH EVERY DAY 15 tablet 5  . torsemide (DEMADEX) 20 MG tablet TAKE 6 TABLETS IN THE MORNING AND 3 TABLETS IN THE EVENING 270 tablet 3  . vitamin C (ASCORBIC ACID) 500 MG tablet Take 500 mg by mouth 2 (two) times daily.    . benzonatate (TESSALON) 200 MG capsule Take 1 capsule (200 mg total) by mouth 3 (three) times daily as needed for cough. (Patient not taking: Reported on 04/21/2015) 40 capsule 1   No current facility-administered medications on file prior to encounter.    Filed Vitals:   05/19/15 1052  BP: 98/52  Pulse: 69  Height: 5\' 8"  (1.727 m)  Weight: 285 lb 9.6 oz (129.547 kg)  SpO2: 98%    General: NAD. Wife present Neck: JVP 9-10 cm, no thyromegaly or thyroid nodule.  Lungs: Clear to auscultation bilaterally with normal respiratory effort. CV: Nondisplaced PMI.  Irregular  S1/S2, no S3/S4, 2/6 early SEM RUSB.  No edema.  Left carotid bruit.  1+ PT pulses bilaterally.  Abdomen: Soft, obese nontender, no hepatosplenomegaly.  Mildly distended.  Skin: Intact without lesions or rashes.  Neurologic: Alert and oriented x 3.  Psych: Normal affect. Extremities: No clubbing or cyanosis.   Assessment/Plan: 1. Chronic diastolic CHF:  EF 08-65% (02/2015).  NYHA class II-III symptoms.  He is volume overloaded on exam with weight up 8 lbs.  He has only been using metolazone about once a week.  - Continue torsemide 120 qam/60 qpm.  - This week, he will take metolazone 2.5  mg on Wednesday, Thursday, and Saturday.  After this week, he will take it every Wednesday and Saturday.  - BMET/BNP today, repeat BMET in 2 wks.  2. Chronic atrial fibrillation:  CHADSVASC 4 - age > 10, CHF, HTN, DM2. 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 We tried ASA, however it was stopped again due to GIB. HR was very difficult to control, concerned that elevated HR contributed to CHF and exercise intolerance.  Therefore, he had AV nodal ablation with dual chamber pacing.   3. CKD stage III: Most recent renal function stable.  I am going to have him stop valsartan.  BP is soft and creatinine is up.  4. Bioprosthetic AVR: 8/16 echo showed stable bioprosthetic aortic valve.   5. CAD: s/p CABG. No chest pain. Continue statin, BB. He has been off aspirin and anticoagulation with history of GI bleeding.  6. Cirrhosis: NASH-related. Sees Dr. Laural Golden. No ascites on recent abdominal US.  7. Gout: Continues on allopurinol.   8. Leg pain:  Had ABI 5/16--> Normal.    Follow up in 1 month.    [name] 05/19/2015

## 2015-06-02 ENCOUNTER — Ambulatory Visit (INDEPENDENT_AMBULATORY_CARE_PROVIDER_SITE_OTHER): Payer: Medicare Other | Admitting: Internal Medicine

## 2015-06-02 ENCOUNTER — Encounter (INDEPENDENT_AMBULATORY_CARE_PROVIDER_SITE_OTHER): Payer: Self-pay | Admitting: Internal Medicine

## 2015-06-02 VITALS — BP 102/64 | HR 70 | Temp 97.8°F | Resp 16 | Ht 68.0 in | Wt 270.5 lb

## 2015-06-02 DIAGNOSIS — K7460 Unspecified cirrhosis of liver: Secondary | ICD-10-CM

## 2015-06-02 DIAGNOSIS — D509 Iron deficiency anemia, unspecified: Secondary | ICD-10-CM | POA: Diagnosis not present

## 2015-06-02 NOTE — Patient Instructions (Signed)
Physician will call with results of blood tests when completed. 

## 2015-06-02 NOTE — Progress Notes (Signed)
Presenting complaint;  Follow-up for cirrhosis iron deficiency anemia and GERD.  Subjective:  Patient is 69 year old Caucasian male who is here for scheduled visit. He was last seen on 01/27/2015. On 03/19/2015 he was admitted to [facility] with the CHF. He weighed 298 pounds. His weight is now down to 270 pounds. On his last visit he was 277 pounds. He states he lost 30 pounds by the time he was discharged. He says heartburns well controlled with therapy. He denies nausea vomiting or dysphagia. Bowels move daily or every other day. He says he does walk when he goes to grocery store or [employer] which is once or twice a month. He denies melena or rectal bleeding. 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    Current Medications: Outpatient Encounter Prescriptions as of 06/02/2015  Medication Sig  . acetaminophen (TYLENOL) 500 MG tablet Take 2,000 mg by mouth at bedtime as needed for mild pain or headache.  . allopurinol (ZYLOPRIM) 100 MG tablet TAKE 1 TABLET (100 MG TOTAL) BY MOUTH DAILY.  [name] atorvastatin (LIPITOR) 20 MG tablet Take 20 mg by mouth daily at 6 PM.   . benzonatate (TESSALON) 200 MG capsule Take 1 capsule (200 mg total) by mouth 3 (three) times daily as needed for cough.  . colchicine 0.6 MG tablet Take 0.6 mg by mouth daily.   . ferrous gluconate (FERGON) 240 (27 FE) MG tablet Take 240 mg by mouth 2 (two) times daily.  [name] gabapentin (NEURONTIN) 300 MG capsule Take 300 mg by mouth 3 (three) times daily.  . insulin NPH Human (HUMULIN N,NOVOLIN N) 100 UNIT/ML injection Inject 30 Units into the skin 2 (two) times daily before a meal.   . insulin regular (NOVOLIN R,HUMULIN R) 100 units/mL injection Inject 20 Units into the skin 2 (two) times daily before a meal. Per sliding scale  . ipratropium (ATROVENT) 0.06 % nasal spray Place 2 sprays into both nostrils  daily.  [name] KLOR-CON 10 10 MEQ tablet TAKE 6 TABLETS (60 MEQ TOTAL) BY MOUTH 2 (TWO) TIMES DAILY. TAKE EXTRA 40 MEQ WITH METOLAZONE.  [name] metolazone (ZAROXOLYN) 2.5 MG tablet Take 2.5 mg by mouth as directed. Take one tablet 30 minutes before torsemide on Wednesday and Saturday  . metoprolol succinate (TOPROL-XL) 100 MG 24 hr tablet TAKE 1 TABLET BY MOUTH TWICE DAILY WITH OR IMMEDIATELY FOLLOWING A MEAL  . Multiple Vitamins-Minerals (CVS SPECTRAVITE ADULT 50+ PO) Take 1 tablet by mouth daily.  [name] omeprazole (PRILOSEC) 20 MG capsule Take 1 capsule (20 mg total) by mouth 2 (two) times daily before a meal.  . sertraline (ZOLOFT) 50 MG tablet Take 50 mg by mouth daily.  [name] spironolactone (ALDACTONE) 25 MG tablet TAKE 1/2 TABLET BY MOUTH EVERY DAY  . torsemide (DEMADEX) 20 MG tablet TAKE 6 TABLETS IN THE MORNING AND 3 TABLETS IN THE EVENING  . vitamin C (ASCORBIC ACID) 500 MG tablet Take 500 mg by mouth 2 (two) times daily.   No facility-administered encounter medications on file as of 06/02/2015.    Objective: Blood pressure 102/64, pulse 70, temperature 97.8 F (36.6 C), temperature source Oral, resp. rate 16, height 5\' 8"  (1.727 m), weight 270 lb 8 oz (122.698 kg). Patient is alert and in no acute distress. He does not have asterixis. Conjunctiva is pink. Sclera is nonicteric Oropharyngeal mucosa is  normal. No neck masses or thyromegaly noted. Cardiac exam with regular rhythm normal S1 and S2. He has grade 2/6 systolic ejection murmur at left sternal border. Lungs are clear to auscultation. Abdomen is obese soft and nontender. Liver edge is palpable and mid epigastrium and is firm. Shifting dullness is absent. No LE edema or clubbing noted.  Labs/studies Results: H&H on 03/21/2015 was 10.9 and 35.3 and platelet count 120 6K   ultrasound and a 20 01/12/2015 was negative for ascites.  Assessment:  #1. Cirrhosis secondary to NAFLD. He appears to have reasonably well preserved hepatic function.  Ultrasound in August, 2016 was negative for ascites. Unfortunately he has not been successful in losing weight and unable to do regular exercise. He is at risk for progressive liver disease. #2. Iron deficiency anemia. Hemoglobin over 2 months ago was 10.9. Clinically he does not appear to be anemic. #3. GERD. Heartburn is well controlled with PPI. Dose will be reduced on one of the future visits.   Plan:  CBC and alpha-fetoprotein with next blood draw. Office visit in 6 months.

## 2015-06-03 ENCOUNTER — Ambulatory Visit (HOSPITAL_COMMUNITY)
Admission: RE | Admit: 2015-06-03 | Discharge: 2015-06-03 | Disposition: A | Discharge status: Home / Self Care | Payer: Medicare Other | Source: Ambulatory Visit | Attending: Internal Medicine | Admitting: Internal Medicine

## 2015-06-03 DIAGNOSIS — I5033 Acute on chronic diastolic (congestive) heart failure: Secondary | ICD-10-CM | POA: Diagnosis not present

## 2015-06-03 LAB — BRAIN NATRIURETIC PEPTIDE: B Natriuretic Peptide: 133.1 pg/mL — ABNORMAL HIGH (ref 0.0–100.0)

## 2015-06-03 LAB — CBC
HEMATOCRIT: 37.4 % — AB (ref 39.0–52.0)
HEMOGLOBIN: 12.5 g/dL — AB (ref 13.0–17.0)
MCH: 28.5 pg (ref 26.0–34.0)
MCHC: 33.4 g/dL (ref 30.0–36.0)
MCV: 85.2 fL (ref 78.0–100.0)
Platelets: 122 10*3/uL — ABNORMAL LOW (ref 150–400)
RBC: 4.39 MIL/uL (ref 4.22–5.81)
RDW: 16.5 % — AB (ref 11.5–15.5)
WBC: 6.5 10*3/uL (ref 4.0–10.5)

## 2015-06-03 LAB — BASIC METABOLIC PANEL
Anion gap: 16 — ABNORMAL HIGH (ref 5–15)
BUN: 66 mg/dL — AB (ref 6–20)
CHLORIDE: 87 mmol/L — AB (ref 101–111)
CO2: 27 mmol/L (ref 22–32)
CREATININE: 2.37 mg/dL — AB (ref 0.61–1.24)
Calcium: 9.3 mg/dL (ref 8.9–10.3)
GFR calc Af Amer: 31 mL/min — ABNORMAL LOW (ref >60)
GFR calc non Af Amer: 27 mL/min — ABNORMAL LOW (ref >60)
Glucose, Bld: 336 mg/dL — ABNORMAL HIGH (ref 65–99)
POTASSIUM: 3.1 mmol/L — AB (ref 3.5–5.1)
Sodium: 130 mmol/L — ABNORMAL LOW (ref 135–145)

## 2015-06-03 NOTE — Addendum Note (Signed)
Encounter addended by: Harvie Junior, CMA on: 06/03/2015  9:23 AM<BR>     Documentation filed: [employer], Orders

## 2015-06-05 ENCOUNTER — Encounter: Payer: Self-pay | Admitting: *Deleted

## 2015-06-05 ENCOUNTER — Other Ambulatory Visit: Payer: Self-pay | Admitting: *Deleted

## 2015-06-05 NOTE — Patient Outreach (Signed)
Screening call: I had previously spoken with pt in June about [facility] services. He declined services at that time because his daughter had cancer and was dying. He tells me today that she did pass away in August. I told him I was so sorry and that I know it has been difficult for he and his family. He shares with me that now his son is living with them and getting ready to be started on dialysis.  I reviewed pt history of the last several months. He did have a hospitalization for HF in August. He says now he is going to the HF clinic. He is weighing every day. He has cut down most of his salt intake. He knows his meds and what they do. He self administers his meds. He denies any SOB or edema today. He says he knows the HF self management protocol and he knows to call the clinic if he goes into the [place] for intervention.  I feel comfortable that he is managing well. I have given him my name and number and told him he can call me for any problems. I have informed him I am going to send him some information about our program and a magnet with our 24 hour nurse line number.  He was appreciative of this information.  Deloria Lair [facility] [place] [phone]

## 2015-06-10 ENCOUNTER — Ambulatory Visit (HOSPITAL_COMMUNITY)
Admission: RE | Admit: 2015-06-10 | Discharge: 2015-06-10 | Disposition: A | Discharge status: Home / Self Care | Payer: Medicare Other | Source: Ambulatory Visit | Attending: Cardiology | Admitting: Cardiology

## 2015-06-10 DIAGNOSIS — N189 Chronic kidney disease, unspecified: Secondary | ICD-10-CM | POA: Insufficient documentation

## 2015-06-10 DIAGNOSIS — N179 Acute kidney failure, unspecified: Secondary | ICD-10-CM | POA: Diagnosis present

## 2015-06-10 LAB — BASIC METABOLIC PANEL
Anion gap: 11 (ref 5–15)
BUN: 42 mg/dL — ABNORMAL HIGH (ref 6–20)
CHLORIDE: 93 mmol/L — AB (ref 101–111)
CO2: 33 mmol/L — ABNORMAL HIGH (ref 22–32)
CREATININE: 1.78 mg/dL — AB (ref 0.61–1.24)
Calcium: 9.5 mg/dL (ref 8.9–10.3)
GFR, EST AFRICAN AMERICAN: 44 mL/min — AB (ref >60)
GFR, EST NON AFRICAN AMERICAN: 38 mL/min — AB (ref >60)
Glucose, Bld: 237 mg/dL — ABNORMAL HIGH (ref 65–99)
Potassium: 4 mmol/L (ref 3.5–5.1)
SODIUM: 137 mmol/L (ref 135–145)

## 2015-06-16 DIAGNOSIS — H25813 Combined forms of age-related cataract, bilateral: Secondary | ICD-10-CM | POA: Diagnosis not present

## 2015-06-16 DIAGNOSIS — Z7984 Long term (current) use of oral hypoglycemic drugs: Secondary | ICD-10-CM | POA: Diagnosis not present

## 2015-06-16 DIAGNOSIS — E119 Type 2 diabetes mellitus without complications: Secondary | ICD-10-CM | POA: Diagnosis not present

## 2015-06-16 DIAGNOSIS — Z794 Long term (current) use of insulin: Secondary | ICD-10-CM | POA: Diagnosis not present

## 2015-06-16 DIAGNOSIS — D3131 Benign neoplasm of right choroid: Secondary | ICD-10-CM | POA: Diagnosis not present

## 2015-06-23 ENCOUNTER — Ambulatory Visit (HOSPITAL_COMMUNITY)
Admission: RE | Admit: 2015-06-23 | Discharge: 2015-06-23 | Disposition: A | Discharge status: Home / Self Care | Payer: Medicare Other | Source: Ambulatory Visit | Attending: Internal Medicine | Admitting: Internal Medicine

## 2015-06-23 ENCOUNTER — Encounter (HOSPITAL_COMMUNITY): Payer: Self-pay | Admitting: Internal Medicine

## 2015-06-23 VITALS — BP 122/80 | HR 82 | Wt 274.5 lb

## 2015-06-23 DIAGNOSIS — Z87891 Personal history of nicotine dependence: Secondary | ICD-10-CM | POA: Insufficient documentation

## 2015-06-23 DIAGNOSIS — I5032 Chronic diastolic (congestive) heart failure: Secondary | ICD-10-CM

## 2015-06-23 DIAGNOSIS — I2510 Atherosclerotic heart disease of native coronary artery without angina pectoris: Secondary | ICD-10-CM | POA: Diagnosis not present

## 2015-06-23 DIAGNOSIS — Z952 Presence of prosthetic heart valve: Secondary | ICD-10-CM | POA: Insufficient documentation

## 2015-06-23 DIAGNOSIS — J449 Chronic obstructive pulmonary disease, unspecified: Secondary | ICD-10-CM | POA: Insufficient documentation

## 2015-06-23 DIAGNOSIS — I4821 Permanent atrial fibrillation: Secondary | ICD-10-CM

## 2015-06-23 DIAGNOSIS — I482 Chronic atrial fibrillation: Secondary | ICD-10-CM | POA: Insufficient documentation

## 2015-06-23 DIAGNOSIS — K3189 Other diseases of stomach and duodenum: Secondary | ICD-10-CM | POA: Insufficient documentation

## 2015-06-23 DIAGNOSIS — Z9889 Other specified postprocedural states: Secondary | ICD-10-CM

## 2015-06-23 DIAGNOSIS — K7581 Nonalcoholic steatohepatitis (NASH): Secondary | ICD-10-CM | POA: Diagnosis not present

## 2015-06-23 DIAGNOSIS — E1122 Type 2 diabetes mellitus with diabetic chronic kidney disease: Secondary | ICD-10-CM | POA: Diagnosis not present

## 2015-06-23 DIAGNOSIS — M79606 Pain in leg, unspecified: Secondary | ICD-10-CM | POA: Diagnosis not present

## 2015-06-23 DIAGNOSIS — Z953 Presence of xenogenic heart valve: Secondary | ICD-10-CM | POA: Insufficient documentation

## 2015-06-23 DIAGNOSIS — I129 Hypertensive chronic kidney disease with stage 1 through stage 4 chronic kidney disease, or unspecified chronic kidney disease: Secondary | ICD-10-CM | POA: Insufficient documentation

## 2015-06-23 DIAGNOSIS — K219 Gastro-esophageal reflux disease without esophagitis: Secondary | ICD-10-CM | POA: Diagnosis not present

## 2015-06-23 DIAGNOSIS — Z79899 Other long term (current) drug therapy: Secondary | ICD-10-CM | POA: Diagnosis not present

## 2015-06-23 DIAGNOSIS — Z794 Long term (current) use of insulin: Secondary | ICD-10-CM | POA: Insufficient documentation

## 2015-06-23 DIAGNOSIS — G4733 Obstructive sleep apnea (adult) (pediatric): Secondary | ICD-10-CM | POA: Insufficient documentation

## 2015-06-23 DIAGNOSIS — N183 Chronic kidney disease, stage 3 (moderate): Secondary | ICD-10-CM | POA: Insufficient documentation

## 2015-06-23 DIAGNOSIS — Z8249 Family history of ischemic heart disease and other diseases of the circulatory system: Secondary | ICD-10-CM | POA: Insufficient documentation

## 2015-06-23 DIAGNOSIS — M109 Gout, unspecified: Secondary | ICD-10-CM | POA: Insufficient documentation

## 2015-06-23 DIAGNOSIS — E785 Hyperlipidemia, unspecified: Secondary | ICD-10-CM | POA: Insufficient documentation

## 2015-06-23 DIAGNOSIS — Z951 Presence of aortocoronary bypass graft: Secondary | ICD-10-CM

## 2015-06-23 LAB — BASIC METABOLIC PANEL
ANION GAP: 10 (ref 5–15)
BUN: 37 mg/dL — AB (ref 6–20)
CALCIUM: 10.2 mg/dL (ref 8.9–10.3)
CO2: 30 mmol/L (ref 22–32)
Chloride: 98 mmol/L — ABNORMAL LOW (ref 101–111)
Creatinine, Ser: 1.55 mg/dL — ABNORMAL HIGH (ref 0.61–1.24)
GFR calc Af Amer: 52 mL/min — ABNORMAL LOW (ref >60)
GFR, EST NON AFRICAN AMERICAN: 45 mL/min — AB (ref >60)
GLUCOSE: 317 mg/dL — AB (ref 65–99)
POTASSIUM: 4 mmol/L (ref 3.5–5.1)
SODIUM: 138 mmol/L (ref 135–145)

## 2015-06-23 LAB — BRAIN NATRIURETIC PEPTIDE: B NATRIURETIC PEPTIDE 5: 120.2 pg/mL — AB (ref 0.0–100.0)

## 2015-06-23 MED ORDER — POTASSIUM CHLORIDE CRYS ER 10 MEQ PO TBCR: 80.0000 meq | EXTENDED_RELEASE_TABLET | Freq: Two times a day (BID) | ORAL | Status: DC

## 2015-06-23 NOTE — Patient Instructions (Signed)
Labs today  Your physician has requested that you regularly monitor and record your blood pressure readings at home. Please use the same machine at the same time of day to check your readings and record them to bring to your follow-up visit.  We will contact you in 2 months to schedule your next appointment.

## 2015-06-24 NOTE — Progress Notes (Signed)
Patient ID: TOAN MUSKA, male   DOB: [birth year], 69 y.o.   MRN: [id number] GI: Dr Laural Golden- followed for cirrhosis Pulmonary: Dr Melvyn Novas  PCP: Marjean Donna [facility])  Cardiology: Dr. Aundra Dubin  Mr Byram is a 69 yo with history of CAD s/p CABG, permanent atrial fibrillation, chronic diastolic CHF, severe AS s/p bioprosthetic AVR who presents for evaluation of diastolic CHF.  Last LHC in 8/13 showed patent grafts.  AVR was in 10/13.  Last echo in 8/16 showed EF 55-60%, normal bioprosthetic aortic valve.   July 2015:  Admitted to the hospital for GI bleed requiring 2 PRBCs. No additional GI work-up with recent EGD showing gastritis.  He had an AV nodal ablation in 9/15 with significant improvement after this.  He has St Jude PPM.   He was admitted in 8/16 with volume overload and diuresed.    He returns for HF follow up. He is now taking metolazone twice a week.  Weight is down 11 lbs.  He is able to walk farther without dyspnea.  No dyspnea walking on flat ground.  No orthopnea/PND.  No chest pain. BP is good today, but he says that it will run high at times on his home cuff.  He is off valsartan with elevated creatinine.   Labs (8/14) LDL 38 Labs (3/15) K 3.4, creatinine 1.7, BUN 55  Labs (10/15/13) K 4.0 Creatinine 1.6 BUN 32 Labs (10/29/13) K 4.0 Creatinine 1.6  Labs (4/15) K 4.2 Creatinine 1.32 => 1.5, HCT 35.2 Labs (12/11/13) K 3.6 Creatinine 1.56 Pro BNP 1152 Hemoglobin 9.6  Labs (01/12/14) K 3.0, creatinine 2.0, hemoglobin 8.8 Labs (01/31/14) K 3.3, creatinine 1.49 Labs (8/15) K 3.9, creatinine 1.89 Labs (9/15): K 3.7, creatinine 1.6, AST 48, ALT 52, TSH normal Labs (10/15): K 3.5, creatinine 1.58, BNP 780 Labs (3/16): K 3.5, creatinine 1.6 Labs 12/03/2014: K 4.4 Creatinine 1.55 Labs 12/12/2014: K 3.7 Creatinine 1.48, LDL 50 Labs 8/16: K 3.8, creatinine 2.42 Labs 04/07/2015: K 4.3, Creatinine 2.22, BNP 169 Labs (11/16): K 4, creatinine 1.78  PMH: 1. CAD: s/p CABG 2004. Last LHC  (8/13) with patent free radial-OM, patent SVG-D, patent LIMA-LAD.  2. Permanent atrial fibrillation: Not anticoagulated due to GI bleeding.  Holter monitor (4/15) with mean HR 115 (afib).   He had AV nodal ablation with St Jude dual chamber PPM  3. Chronic diastolic CHF: Echo (123456) with EF 55-60%, bioprosthetic aortic valve with mean gradient 22 mmHg, mildly decreased RV systolic function. RHC (12/2013): RA 14, RV 44/4/11, PA 50/23 (33), PCWP 20, v = 35, Fick CO/CI: 6.0/2.6, PVR 2.2 WU, PA 59%.  Echo (8/15) with EF 55-60%, mild LVH, bioprosthetic aortic valve with mean gradient 16 mmHg, PA systolic pressure 46 mmHg, mild to moderate MR.  Echo (8/16) with EF 55-60%, normal bioprosthetic aortic valve, mild MR, PASP 44 mmHg.  4. Severe aortic stenosis s/p bioprosthetic AVR in 10/13. Mean gradient 22 mmHg across valve on echo in 8/14.  Mean gradient 16 mmHg on echo in 8/15.  5. NASH: ascites, thrombocytopenia.  Had paracentesis in 1/15. Gynecomastia with spironolactone.  6. 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  EGD in 1/15 showed mild portal gastropathy and 2 small antral ulcers. Recurrent GI bleed in 6/15, EGD showed gastritis and ?GAVE.  7. Hyperlipidemia 8. HTN 9. GERD 10. COPD 35. OSA: Cannot tolerate CPAP.  12. CKD 13. Gout 14. Carotid dopplers (11/15) with minimal stenosis.  15. ABI 12/11/2014: normal    SH: Lives with  wife in [place], prior smoker.   FH: CAD  ROS: All systems reviewed and negative except as per HPI.   Current Outpatient Prescriptions on File Prior to Encounter  Medication Sig Dispense Refill  . acetaminophen (TYLENOL) 500 MG tablet Take 2,000 mg by mouth at bedtime as needed for mild pain or headache.    . allopurinol (ZYLOPRIM) 100 MG tablet TAKE 1 TABLET (100 MG TOTAL) BY MOUTH DAILY. 30 tablet 6  . atorvastatin (LIPITOR) 20 MG tablet Take 20 mg by mouth daily at 6 PM.     . benzonatate (TESSALON) 200 MG capsule Take 1 capsule (200 mg total) by mouth 3 (three) times  daily as needed for cough. 40 capsule 1  . colchicine 0.6 MG tablet Take 0.6 mg by mouth daily.     . ferrous gluconate (FERGON) 240 (27 FE) MG tablet Take 240 mg by mouth 2 (two) times daily.    [name] gabapentin (NEURONTIN) 300 MG capsule Take 300 mg by mouth 3 (three) times daily.  6  . insulin NPH Human (HUMULIN N,NOVOLIN N) 100 UNIT/ML injection Inject 30 Units into the skin 2 (two) times daily before a meal.     . insulin regular (NOVOLIN R,HUMULIN R) 100 units/mL injection Inject 20 Units into the skin 2 (two) times daily before a meal. Per sliding scale    . ipratropium (ATROVENT) 0.06 % nasal spray Place 2 sprays into both nostrils daily.    . metolazone (ZAROXOLYN) 2.5 MG tablet Take 2.5 mg by mouth as directed. Take one tablet 30 minutes before torsemide on Wednesday and Saturday    . metoprolol succinate (TOPROL-XL) 100 MG 24 hr tablet TAKE 1 TABLET BY MOUTH TWICE DAILY WITH OR IMMEDIATELY FOLLOWING A MEAL 60 tablet 6  . Multiple Vitamins-Minerals (CVS SPECTRAVITE ADULT 50+ PO) Take 1 tablet by mouth daily.    [name] omeprazole (PRILOSEC) 20 MG capsule Take 1 capsule (20 mg total) by mouth 2 (two) times daily before a meal. 30 capsule 5  . sertraline (ZOLOFT) 50 MG tablet Take 50 mg by mouth daily.    [name] spironolactone (ALDACTONE) 25 MG tablet TAKE 1/2 TABLET BY MOUTH EVERY DAY 15 tablet 5  . torsemide (DEMADEX) 20 MG tablet TAKE 6 TABLETS IN THE MORNING AND 3 TABLETS IN THE EVENING 270 tablet 3  . vitamin C (ASCORBIC ACID) 500 MG tablet Take 500 mg by mouth 2 (two) times daily.     No current facility-administered medications on file prior to encounter.    Filed Vitals:   06/23/15 1038  BP: 122/80  Pulse: 82  Weight: 274 lb 8 oz (124.512 kg)  SpO2: 96%    General: NAD. Wife present Neck: JVP 8 cm, no thyromegaly or thyroid nodule.  Lungs: Clear to auscultation bilaterally with normal respiratory effort. CV: Nondisplaced PMI.  Irregular S1/S2, no S3/S4, 2/6 early SEM RUSB.  No edema.   Left carotid bruit.  1+ PT pulses bilaterally.  Abdomen: Soft, obese nontender, no hepatosplenomegaly.  Mildly distended.  Skin: Intact without lesions or rashes.  Neurologic: Alert and oriented x 3.  Psych: Normal affect. Extremities: No clubbing or cyanosis.   Assessment/Plan: 1. Chronic diastolic CHF:  EF 0000000 (02/2015).  NYHA class II symptoms.  Weight is down, volume status looks better. - Continue torsemide 120 qam/60 qpm.  - Continue metolazone every Wednesday and Saturday.  - BMET/BNP today.  2. Chronic atrial fibrillation:  CHADSVASC 4 - age > 9, CHF, HTN, DM2. He is not anticoagulated (  was on coumadin in past) due to history of GI bleeding from AVMs. We tried ASA, however it was stopped again due to GIB. HR was very difficult to control, concerned that elevated HR contributed to CHF and exercise intolerance.  Therefore, he had AV nodal ablation with dual chamber pacing.   3. CKD stage III: BMET today.  He is now off valsartan with elevated creatinine.  4. Bioprosthetic AVR: 8/16 echo showed stable bioprosthetic aortic valve.   5. CAD: s/p CABG. No chest pain. Continue statin, BB. He has been off aspirin and anticoagulation with history of GI bleeding.  6. Cirrhosis: NASH-related. Sees Dr. Laural Golden. No ascites on recent abdominal US.  7. Gout: Continues on allopurinol.   8. Leg pain:  Had ABI 5/16--> Normal.    Follow up in 2 months    [name] 06/24/2015

## 2015-06-29 DIAGNOSIS — E7800 Pure hypercholesterolemia, unspecified: Secondary | ICD-10-CM | POA: Diagnosis not present

## 2015-06-29 DIAGNOSIS — E1165 Type 2 diabetes mellitus with hyperglycemia: Secondary | ICD-10-CM | POA: Diagnosis not present

## 2015-06-30 DIAGNOSIS — N189 Chronic kidney disease, unspecified: Secondary | ICD-10-CM | POA: Diagnosis not present

## 2015-06-30 DIAGNOSIS — E1165 Type 2 diabetes mellitus with hyperglycemia: Secondary | ICD-10-CM | POA: Diagnosis not present

## 2015-06-30 DIAGNOSIS — I10 Essential (primary) hypertension: Secondary | ICD-10-CM | POA: Diagnosis not present

## 2015-06-30 DIAGNOSIS — E7800 Pure hypercholesterolemia, unspecified: Secondary | ICD-10-CM | POA: Diagnosis not present

## 2015-07-07 ENCOUNTER — Other Ambulatory Visit (HOSPITAL_COMMUNITY): Payer: Self-pay | Admitting: Internal Medicine

## 2015-07-14 ENCOUNTER — Other Ambulatory Visit (HOSPITAL_COMMUNITY): Payer: Self-pay | Admitting: *Deleted

## 2015-07-14 MED ORDER — METOLAZONE 2.5 MG PO TABS: 2.5000 mg | ORAL_TABLET | ORAL | Status: DC

## 2015-07-15 DIAGNOSIS — H3582 Retinal ischemia: Secondary | ICD-10-CM | POA: Diagnosis not present

## 2015-07-15 DIAGNOSIS — H211X3 Other vascular disorders of iris and ciliary body, bilateral: Secondary | ICD-10-CM | POA: Diagnosis not present

## 2015-07-15 DIAGNOSIS — H35432 Paving stone degeneration of retina, left eye: Secondary | ICD-10-CM | POA: Diagnosis not present

## 2015-07-15 DIAGNOSIS — H43812 Vitreous degeneration, left eye: Secondary | ICD-10-CM | POA: Diagnosis not present

## 2015-07-16 ENCOUNTER — Other Ambulatory Visit (HOSPITAL_COMMUNITY): Payer: Self-pay | Admitting: Internal Medicine

## 2015-07-21 ENCOUNTER — Encounter: Payer: Self-pay | Admitting: *Deleted

## 2015-07-29 DIAGNOSIS — J069 Acute upper respiratory infection, unspecified: Secondary | ICD-10-CM | POA: Diagnosis not present

## 2015-08-11 DIAGNOSIS — H2511 Age-related nuclear cataract, right eye: Secondary | ICD-10-CM | POA: Diagnosis not present

## 2015-08-11 DIAGNOSIS — H2512 Age-related nuclear cataract, left eye: Secondary | ICD-10-CM | POA: Diagnosis not present

## 2015-08-11 DIAGNOSIS — H18412 Arcus senilis, left eye: Secondary | ICD-10-CM | POA: Diagnosis not present

## 2015-08-11 DIAGNOSIS — H18411 Arcus senilis, right eye: Secondary | ICD-10-CM | POA: Diagnosis not present

## 2015-08-14 ENCOUNTER — Other Ambulatory Visit (HOSPITAL_COMMUNITY): Payer: Self-pay | Admitting: Internal Medicine

## 2015-08-26 ENCOUNTER — Encounter: Payer: Self-pay | Admitting: *Deleted

## 2015-09-02 ENCOUNTER — Encounter (HOSPITAL_COMMUNITY): Payer: Medicare Other | Admitting: Internal Medicine

## 2015-09-09 ENCOUNTER — Encounter (HOSPITAL_COMMUNITY): Payer: Medicare Other

## 2015-09-14 ENCOUNTER — Telehealth (HOSPITAL_COMMUNITY): Payer: Self-pay | Admitting: Pharmacist

## 2015-09-14 ENCOUNTER — Other Ambulatory Visit (HOSPITAL_COMMUNITY): Payer: Self-pay | Admitting: Adult Health

## 2015-09-14 NOTE — Telephone Encounter (Signed)
Metoprolol ER 100 mg BID PA approved by Christella Scheuermann and verified with pharmacy that was able to be processed through insurance.   [name], PharmD, BCPS, CPP Clinical Pharmacist Pager: [phone] Phone: [phone] 09/14/2015 3:21 PM

## 2015-09-15 ENCOUNTER — Ambulatory Visit (HOSPITAL_COMMUNITY)
Admission: RE | Admit: 2015-09-15 | Discharge: 2015-09-15 | Disposition: A | Discharge status: Home / Self Care | Payer: Medicare Other | Source: Ambulatory Visit | Attending: Internal Medicine | Admitting: Internal Medicine

## 2015-09-15 ENCOUNTER — Encounter (HOSPITAL_COMMUNITY): Payer: Self-pay

## 2015-09-15 VITALS — BP 112/62 | HR 69 | Resp 18 | Wt 285.8 lb

## 2015-09-15 DIAGNOSIS — Z79899 Other long term (current) drug therapy: Secondary | ICD-10-CM | POA: Insufficient documentation

## 2015-09-15 DIAGNOSIS — E785 Hyperlipidemia, unspecified: Secondary | ICD-10-CM | POA: Insufficient documentation

## 2015-09-15 DIAGNOSIS — I482 Chronic atrial fibrillation: Secondary | ICD-10-CM

## 2015-09-15 DIAGNOSIS — I2510 Atherosclerotic heart disease of native coronary artery without angina pectoris: Secondary | ICD-10-CM | POA: Insufficient documentation

## 2015-09-15 DIAGNOSIS — M79606 Pain in leg, unspecified: Secondary | ICD-10-CM | POA: Insufficient documentation

## 2015-09-15 DIAGNOSIS — Z8249 Family history of ischemic heart disease and other diseases of the circulatory system: Secondary | ICD-10-CM | POA: Insufficient documentation

## 2015-09-15 DIAGNOSIS — I5032 Chronic diastolic (congestive) heart failure: Secondary | ICD-10-CM | POA: Diagnosis not present

## 2015-09-15 DIAGNOSIS — J449 Chronic obstructive pulmonary disease, unspecified: Secondary | ICD-10-CM | POA: Insufficient documentation

## 2015-09-15 DIAGNOSIS — Z951 Presence of aortocoronary bypass graft: Secondary | ICD-10-CM | POA: Insufficient documentation

## 2015-09-15 DIAGNOSIS — I4821 Permanent atrial fibrillation: Secondary | ICD-10-CM

## 2015-09-15 DIAGNOSIS — Z87891 Personal history of nicotine dependence: Secondary | ICD-10-CM | POA: Diagnosis not present

## 2015-09-15 DIAGNOSIS — Q2733 Arteriovenous malformation of digestive system vessel: Secondary | ICD-10-CM | POA: Diagnosis not present

## 2015-09-15 DIAGNOSIS — Z9889 Other specified postprocedural states: Secondary | ICD-10-CM

## 2015-09-15 DIAGNOSIS — K7581 Nonalcoholic steatohepatitis (NASH): Secondary | ICD-10-CM | POA: Insufficient documentation

## 2015-09-15 DIAGNOSIS — I130 Hypertensive heart and chronic kidney disease with heart failure and stage 1 through stage 4 chronic kidney disease, or unspecified chronic kidney disease: Secondary | ICD-10-CM | POA: Insufficient documentation

## 2015-09-15 DIAGNOSIS — K219 Gastro-esophageal reflux disease without esophagitis: Secondary | ICD-10-CM | POA: Diagnosis not present

## 2015-09-15 DIAGNOSIS — K5520 Angiodysplasia of colon without hemorrhage: Secondary | ICD-10-CM

## 2015-09-15 DIAGNOSIS — M109 Gout, unspecified: Secondary | ICD-10-CM | POA: Diagnosis not present

## 2015-09-15 DIAGNOSIS — Z794 Long term (current) use of insulin: Secondary | ICD-10-CM | POA: Insufficient documentation

## 2015-09-15 DIAGNOSIS — Z953 Presence of xenogenic heart valve: Secondary | ICD-10-CM | POA: Diagnosis not present

## 2015-09-15 DIAGNOSIS — N183 Chronic kidney disease, stage 3 (moderate): Secondary | ICD-10-CM | POA: Insufficient documentation

## 2015-09-15 LAB — BASIC METABOLIC PANEL
Anion gap: 15 (ref 5–15)
BUN: 29 mg/dL — AB (ref 6–20)
CALCIUM: 9.6 mg/dL (ref 8.9–10.3)
CO2: 22 mmol/L (ref 22–32)
Chloride: 107 mmol/L (ref 101–111)
Creatinine, Ser: 1.45 mg/dL — ABNORMAL HIGH (ref 0.61–1.24)
GFR, EST AFRICAN AMERICAN: 56 mL/min — AB (ref >60)
GFR, EST NON AFRICAN AMERICAN: 48 mL/min — AB (ref >60)
Glucose, Bld: 95 mg/dL (ref 65–99)
POTASSIUM: 3.7 mmol/L (ref 3.5–5.1)
SODIUM: 144 mmol/L (ref 135–145)

## 2015-09-15 LAB — BRAIN NATRIURETIC PEPTIDE: B NATRIURETIC PEPTIDE 5: 210.8 pg/mL — AB (ref 0.0–100.0)

## 2015-09-15 MED ORDER — METOLAZONE 2.5 MG PO TABS: 2.5000 mg | ORAL_TABLET | ORAL | Status: DC

## 2015-09-15 MED ORDER — PREDNISONE 20 MG PO TABS: 20.0000 mg | ORAL_TABLET | Freq: Every day | ORAL | Status: AC

## 2015-09-15 NOTE — Progress Notes (Signed)
Patient ID: [name], male   DOB: [birth year], 70 y.o.   MRN: [id number] GI: Dr Laural Golden- followed for cirrhosis Pulmonary: Dr Melvyn Novas  PCP: Marjean Donna [facility])  Cardiology: Dr. Aundra Dubin  [name] is a 70 yo with history of CAD s/p CABG, permanent atrial fibrillation, chronic diastolic CHF, severe AS s/p bioprosthetic AVR who presents for evaluation of diastolic CHF.  Last LHC in 8/13 showed patent grafts.  AVR was in 10/13.  Last echo in 8/16 showed EF 55-60%, normal bioprosthetic aortic valve.   July 2015:  Admitted to the hospital for GI bleed requiring 2 PRBCs. No additional GI work-up with recent EGD showing gastritis.  He had an AV nodal ablation in 9/15 with significant improvement after this.  He has St Jude PPM.   He was admitted in 8/16 with volume overload and diuresed.    He returns for HF follow up. He is now taking metolazone twice a week.  His main problem recently has been a severe gout flare in his left knee and ankle.  He is taking colchicine twice a day with some improvement finally, though he still has a lot of pain.  Says that he did not get out of bed for a week.  Weight is up about 11 lbs but he has been fairly immobile.  Walking again some now.  No dyspnea walking short distances.  No orthopnea/PND.  No chest pain.   Labs (8/14) LDL 38 Labs (3/15) K 3.4, creatinine 1.7, BUN 55  Labs (10/15/13) K 4.0 Creatinine 1.6 BUN 32 Labs (10/29/13) K 4.0 Creatinine 1.6  Labs (4/15) K 4.2 Creatinine 1.32 => 1.5, HCT 35.2 Labs (12/11/13) K 3.6 Creatinine 1.56 Pro BNP 1152 Hemoglobin 9.6  Labs (01/12/14) K 3.0, creatinine 2.0, hemoglobin 8.8 Labs (01/31/14) K 3.3, creatinine 1.49 Labs (8/15) K 3.9, creatinine 1.89 Labs (9/15): K 3.7, creatinine 1.6, AST 48, ALT 52, TSH normal Labs (10/15): K 3.5, creatinine 1.58, BNP 780 Labs (3/16): K 3.5, creatinine 1.6 Labs 12/03/2014: K 4.4 Creatinine 1.55 Labs 12/12/2014: K 3.7 Creatinine 1.48, LDL 50 Labs 8/16: K 3.8, creatinine  2.42 Labs 04/07/2015: K 4.3, Creatinine 2.22, BNP 169 Labs (11/16): K 4, creatinine 1.78 => 1.55, BNP 120  PMH: 1. CAD: s/p CABG 2004. Last LHC (8/13) with patent free radial-OM, patent SVG-D, patent LIMA-LAD.  2. Permanent atrial fibrillation: Not anticoagulated due to GI bleeding.  Holter monitor (4/15) with mean HR 115 (afib).   He had AV nodal ablation with St Jude dual chamber PPM  3. Chronic diastolic CHF: Echo (123456) with EF 55-60%, bioprosthetic aortic valve with mean gradient 22 mmHg, mildly decreased RV systolic function. RHC (12/2013): RA 14, RV 44/4/11, PA 50/23 (33), PCWP 20, v = 35, Fick CO/CI: 6.0/2.6, PVR 2.2 WU, PA 59%.  Echo (8/15) with EF 55-60%, mild LVH, bioprosthetic aortic valve with mean gradient 16 mmHg, PA systolic pressure 46 mmHg, mild to moderate Matthew.  Echo (8/16) with EF 55-60%, normal bioprosthetic aortic valve, mild Matthew, PASP 44 mmHg.  4. Severe aortic stenosis s/p bioprosthetic AVR in 10/13. Mean gradient 22 mmHg across valve on echo in 8/14.  Mean gradient 16 mmHg on echo in 8/15.  5. NASH: ascites, thrombocytopenia.  Had paracentesis in 1/15. Gynecomastia with spironolactone.  6. 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  EGD in 1/15 showed mild portal gastropathy and 2 small antral ulcers. Recurrent GI bleed in 6/15, EGD showed gastritis and ?GAVE.  7. Hyperlipidemia 8. HTN 9. GERD  10. COPD 11. OSA: Cannot tolerate CPAP.  12. CKD 13. Gout 14. Carotid dopplers (11/15) with minimal stenosis.  15. ABI 12/11/2014: normal    SH: Lives with wife in [place], prior smoker.   FH: CAD  ROS: All systems reviewed and negative except as per HPI.   Current Outpatient Prescriptions on File Prior to Encounter  Medication Sig Dispense Refill  . acetaminophen (TYLENOL) 500 MG tablet Take 2,000 mg by mouth at bedtime as needed for mild pain or headache.    . allopurinol (ZYLOPRIM) 100 MG tablet TAKE 1 TABLET (100 MG TOTAL) BY MOUTH DAILY. 30 tablet 6  . atorvastatin (LIPITOR)  20 MG tablet Take 20 mg by mouth daily at 6 PM.     . benzonatate (TESSALON) 200 MG capsule Take 1 capsule (200 mg total) by mouth 3 (three) times daily as needed for cough. 40 capsule 1  . colchicine 0.6 MG tablet Take 0.6 mg by mouth daily.     . ferrous gluconate (FERGON) 240 (27 FE) MG tablet Take 240 mg by mouth 2 (two) times daily.    [name] gabapentin (NEURONTIN) 300 MG capsule Take 300 mg by mouth 3 (three) times daily.  6  . insulin NPH Human (HUMULIN N,NOVOLIN N) 100 UNIT/ML injection Inject 30 Units into the skin 2 (two) times daily before a meal.     . insulin regular (NOVOLIN R,HUMULIN R) 100 units/mL injection Inject 20 Units into the skin 2 (two) times daily before a meal. Per sliding scale    . ipratropium (ATROVENT) 0.06 % nasal spray Place 2 sprays into both nostrils daily.    . metoprolol succinate (TOPROL-XL) 100 MG 24 hr tablet Take 1 tablet (100 mg total) by mouth 2 (two) times daily. 60 tablet 6  . Multiple Vitamins-Minerals ([employer] SPECTRAVITE ADULT 50+ PO) Take 1 tablet by mouth daily.    [name] omeprazole (PRILOSEC) 20 MG capsule Take 1 capsule (20 mg total) by mouth 2 (two) times daily before a meal. 30 capsule 5  . potassium chloride (K-DUR,KLOR-CON) 10 MEQ tablet Take 8 tablets (80 mEq total) by mouth 2 (two) times daily. Take an extra 4 tabs on Wed and Sat with Metolazone 515 tablet 6  . sertraline (ZOLOFT) 50 MG tablet Take 50 mg by mouth daily.    [name] spironolactone (ALDACTONE) 25 MG tablet TAKE 1/2 TABLET BY MOUTH EVERY DAY 15 tablet 5  . torsemide (DEMADEX) 20 MG tablet TAKE 6 TABLETS IN THE MORNING AND 3 TABLETS IN THE EVENING 270 tablet 3  . vitamin C (ASCORBIC ACID) 500 MG tablet Take 500 mg by mouth 2 (two) times daily.     No current facility-administered medications on file prior to encounter.    Filed Vitals:   09/15/15 0946  BP: 112/62  Pulse: 69  Resp: 18  Weight: 285 lb 12 oz (129.615 kg)  SpO2: 96%    General: NAD. Wife present Neck: JVP 8-9 cm, no  thyromegaly or thyroid nodule.  Lungs: Clear to auscultation bilaterally with normal respiratory effort. CV: Nondisplaced PMI.  Irregular S1/S2, no S3/S4, 2/6 early SEM RUSB.  No edema.  Left carotid bruit.  1+ PT pulses bilaterally.  Abdomen: Soft, obese nontender, no hepatosplenomegaly.  Mildly distended.  Skin: Intact without lesions or rashes.  Neurologic: Alert and oriented x 3.  Psych: Normal affect. Extremities: No clubbing or cyanosis. Left ankle is warm and tender.   Assessment/Plan: 1. Chronic diastolic CHF:  EF 0000000 (02/2015).  NYHA class II-III symptoms.  Weight is up, but this is likely partially due to immobility/lack of exercise.  On exam, he is mildly volume overloaded.  - Continue torsemide 120 qam/60 qpm.  - Increase metolazone to tiw, Monday/Wednesday/Saturday.  - BMET/BNP today and again in 2 wks.  2. Chronic atrial fibrillation:  CHADSVASC 4 - age > 63, CHF, HTN, DM2. 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 We tried ASA, however it was stopped again due to GIB. HR was very difficult to control, concerned that elevated HR contributed to CHF and exercise intolerance.  Therefore, he had AV nodal ablation with dual chamber pacing.   3. CKD stage III: BMET today.   4. Bioprosthetic AVR: 8/16 echo showed stable bioprosthetic aortic valve.   5. CAD: s/p CABG. No chest pain. Continue statin, BB. He has been off aspirin and anticoagulation with history of GI bleeding.  6. Cirrhosis: NASH-related. Sees Dr. Laural Golden.   7. Gout: Acute flare, has been severe with some improvement in the last couple of days.  Still very limited.  He is on colchicine.  I will give him a short course of prednisone given persistent and severe symptoms, 20 mg daily x 5 days.    8. Leg pain:  Had ABI 5/16--> Normal.    Follow up in 6 wks   [name] 09/15/2015

## 2015-09-15 NOTE — Patient Instructions (Signed)
Increase Metolazone to 3 times week, take every Mon, Wed and Friday  Take Prednisone 20 mg daily for 5 days for gout  Labs today  Labs in 2 weeks  Your physician recommends that you schedule a follow-up appointment in: 6 weeks

## 2015-09-16 ENCOUNTER — Telehealth (HOSPITAL_COMMUNITY): Payer: Self-pay | Admitting: *Deleted

## 2015-09-16 NOTE — Telephone Encounter (Signed)
Received form from [facility] for pt needed clearance for left and right eye cataract extraction, pt cleared by Dr Aundra Dubin and signed form, form along w/2/21 OV note faxed back to them at [phone]

## 2015-09-23 ENCOUNTER — Encounter: Payer: Self-pay | Admitting: *Deleted

## 2015-09-25 ENCOUNTER — Encounter: Payer: Self-pay | Admitting: Internal Medicine

## 2015-09-25 ENCOUNTER — Ambulatory Visit (INDEPENDENT_AMBULATORY_CARE_PROVIDER_SITE_OTHER): Payer: Medicare Other | Admitting: Internal Medicine

## 2015-09-25 VITALS — BP 132/82 | HR 72 | Ht 68.0 in | Wt 276.2 lb

## 2015-09-25 DIAGNOSIS — G4733 Obstructive sleep apnea (adult) (pediatric): Secondary | ICD-10-CM

## 2015-09-25 DIAGNOSIS — E663 Overweight: Secondary | ICD-10-CM | POA: Diagnosis not present

## 2015-09-25 NOTE — Patient Instructions (Signed)
We can continue CPAP auto 5-15/ [facility].  It is important to get back to using it all night, every night, any time you sleep.  Please call if we can help

## 2015-09-25 NOTE — Progress Notes (Signed)
   Subjective:    Patient ID: [name], male    DOB: 1945/12/21, 70 y.o.   MRN: [id number]  HPI  11/18/14- Dr Gwenette Greet The patient comes in today for follow-up of his known severe obstructive sleep apnea. Started on C Pap at the last visit with the auto setting, and his download shows good compliance with great control of his AHI. Despite this, the patient is having issues with tolerance, and feels the pressure may be too high. I have shown him the download, and that his pressures are really on the mild end.   09/25/2015-70 year old male followed for OSA , complicated by CAD/CABG, AVR, CHF CPAP auto 5-15/[facility] FOLLOWS FOR: Pt states that he has been inconsistent with CPAP over the last 90 days d/t being sick. Usually wears CPAP nightly.  Denies problems with mask/pressure. DME: Kendrick Fries indicates noncompliance and basically not wearing CPAP since January 1. His wife confirms he started back last night. He had been sick and uncomfortable with flu and gout. These problems are now resolving.  Review of Systems  Constitutional: Negative for fever and unexpected weight change.  HENT: Negative for congestion, dental problem, ear pain, nosebleeds, postnasal drip, rhinorrhea, sinus pressure, sneezing, sore throat and trouble swallowing.   Eyes: Negative for redness and itching.  Respiratory: Negative for cough, chest tightness, shortness of breath and wheezing.   Cardiovascular: Negative for palpitations and leg swelling.  Gastrointestinal: Negative for nausea and vomiting.  Genitourinary: Negative for dysuria.  Musculoskeletal: Negative for joint swelling.  Skin: Negative for rash.  Neurological: Negative for headaches.  Hematological: Does not bruise/bleed easily.  Psychiatric/Behavioral: Negative for dysphoric mood. The patient is not nervous/anxious.      Objective:   OBJ- Physical Exam General- Alert, Oriented, Affect-appropriate, Distress- none acute, + obese Skin- rash-none,  lesions- none, excoriation- none Lymphadenopathy- none Head- atraumatic            Eyes- Gross vision intact, PERRLA, conjunctivae and secretions clear            Ears- Hearing, canals-normal            Nose- Clear, no-Septal dev, mucus, polyps, erosion, perforation             Throat- Mallampati III , mucosa clear , drainage- none, tonsils- atrophic Neck- flexible , trachea midline, no stridor , thyroid nl, carotid no bruit Chest - symmetrical excursion , unlabored           Heart/CV- RRR , no murmur , no gallop  , no rub, nl s1 s2                           - JVD- none , edema- none, stasis changes- none, varices- none           Lung- distant/clear to P&A, wheeze- none, cough- none , dullness-none, rub- none           Chest wall-  Abd-  Br/ Gen/ Rectal- Not done, not indicated Extrem- cyanosis- none, clubbing, none, atrophy- none, strength- nl. + Vein donor scars Neuro- grossly intact to observatio    Assessment & Plan:

## 2015-09-26 ENCOUNTER — Encounter: Payer: Self-pay | Admitting: Internal Medicine

## 2015-09-26 NOTE — Assessment & Plan Note (Signed)
Medical problems had him overwhelmed for a while but he is doing better and indicates he is prepared to resume using CPAP all night every night. Medical importance of this was emphasized for him.

## 2015-09-26 NOTE — Assessment & Plan Note (Signed)
Significantly overweight, impacting several of his medical problems as explained. Weight loss encouraged.

## 2015-09-28 DIAGNOSIS — H2512 Age-related nuclear cataract, left eye: Secondary | ICD-10-CM | POA: Diagnosis not present

## 2015-09-28 DIAGNOSIS — Z961 Presence of intraocular lens: Secondary | ICD-10-CM | POA: Diagnosis not present

## 2015-09-28 DIAGNOSIS — H25812 Combined forms of age-related cataract, left eye: Secondary | ICD-10-CM | POA: Diagnosis not present

## 2015-09-29 ENCOUNTER — Telehealth: Payer: Self-pay | Admitting: Internal Medicine

## 2015-09-29 DIAGNOSIS — H2511 Age-related nuclear cataract, right eye: Secondary | ICD-10-CM | POA: Diagnosis not present

## 2015-09-29 NOTE — Telephone Encounter (Signed)
Spoke with the pt's spouse  She states that pt is unable to use CPAP for the next wk due to eye surgery he just had  He has to wear a shield over his eye  Will forward to CDY to make him aware  FYI only

## 2015-09-29 NOTE — Telephone Encounter (Signed)
Called and spoke with pt's wife. I informed her that CY is aware of situation. She voiced understanding and had no further questions. Nothing further needed.

## 2015-09-29 NOTE — Telephone Encounter (Signed)
Noted  

## 2015-09-29 NOTE — Telephone Encounter (Signed)
New message  Pt called request a call back to discuss recently received letter that the implant hasn't been checked. Pt states that they were advised to only have the remote pacer check.. Please call

## 2015-10-02 ENCOUNTER — Ambulatory Visit (HOSPITAL_COMMUNITY)
Admission: RE | Admit: 2015-10-02 | Discharge: 2015-10-02 | Disposition: A | Discharge status: Home / Self Care | Payer: Medicare Other | Source: Ambulatory Visit | Attending: Internal Medicine | Admitting: Internal Medicine

## 2015-10-02 DIAGNOSIS — N179 Acute kidney failure, unspecified: Secondary | ICD-10-CM | POA: Insufficient documentation

## 2015-10-02 DIAGNOSIS — N189 Chronic kidney disease, unspecified: Secondary | ICD-10-CM

## 2015-10-02 LAB — BASIC METABOLIC PANEL
Anion gap: 15 (ref 5–15)
BUN: 46 mg/dL — AB (ref 6–20)
CHLORIDE: 92 mmol/L — AB (ref 101–111)
CO2: 31 mmol/L (ref 22–32)
CREATININE: 1.73 mg/dL — AB (ref 0.61–1.24)
Calcium: 9.6 mg/dL (ref 8.9–10.3)
GFR calc Af Amer: 45 mL/min — ABNORMAL LOW (ref >60)
GFR calc non Af Amer: 39 mL/min — ABNORMAL LOW (ref >60)
GLUCOSE: 158 mg/dL — AB (ref 65–99)
Potassium: 2.7 mmol/L — CL (ref 3.5–5.1)
SODIUM: 138 mmol/L (ref 135–145)

## 2015-10-05 ENCOUNTER — Ambulatory Visit (HOSPITAL_COMMUNITY)
Admission: RE | Admit: 2015-10-05 | Discharge: 2015-10-05 | Disposition: A | Discharge status: Home / Self Care | Payer: Medicare Other | Source: Ambulatory Visit | Attending: Cardiology | Admitting: Cardiology

## 2015-10-05 ENCOUNTER — Encounter: Payer: Self-pay | Admitting: Internal Medicine

## 2015-10-05 ENCOUNTER — Telehealth (HOSPITAL_COMMUNITY): Payer: Self-pay | Admitting: *Deleted

## 2015-10-05 DIAGNOSIS — N189 Chronic kidney disease, unspecified: Secondary | ICD-10-CM | POA: Diagnosis not present

## 2015-10-05 DIAGNOSIS — I5032 Chronic diastolic (congestive) heart failure: Secondary | ICD-10-CM

## 2015-10-05 DIAGNOSIS — N179 Acute kidney failure, unspecified: Secondary | ICD-10-CM | POA: Diagnosis not present

## 2015-10-05 LAB — BASIC METABOLIC PANEL
Anion gap: 15 (ref 5–15)
BUN: 43 mg/dL — ABNORMAL HIGH (ref 6–20)
CALCIUM: 10.4 mg/dL — AB (ref 8.9–10.3)
CO2: 32 mmol/L (ref 22–32)
CREATININE: 1.77 mg/dL — AB (ref 0.61–1.24)
Chloride: 96 mmol/L — ABNORMAL LOW (ref 101–111)
GFR calc non Af Amer: 38 mL/min — ABNORMAL LOW (ref >60)
GFR, EST AFRICAN AMERICAN: 44 mL/min — AB (ref >60)
Glucose, Bld: 117 mg/dL — ABNORMAL HIGH (ref 65–99)
Potassium: 2.6 mmol/L — CL (ref 3.5–5.1)
SODIUM: 143 mmol/L (ref 135–145)

## 2015-10-05 MED ORDER — POTASSIUM CHLORIDE CRYS ER 10 MEQ PO TBCR: 80.0000 meq | EXTENDED_RELEASE_TABLET | Freq: Three times a day (TID) | ORAL | Status: DC

## 2015-10-05 NOTE — Telephone Encounter (Signed)
-----   Message from Scarlette Calico, RN sent at 10/05/2015  2:52 PM EDT ----- Discussed K w/Dr Waldo Laine was increased from 80 meq BID to 100 meq BID on Fri 3/10, he would like pt to increase dose to 80 meq TID and recheck lab on Fri 3/17, attempted to call pt and Left message to call back

## 2015-10-05 NOTE — Telephone Encounter (Signed)
Notes Recorded by Scarlette Calico, RN on 10/05/2015 at 5:14 PM Pt aware and agreeable, repeat labs sch 3/17

## 2015-10-09 ENCOUNTER — Ambulatory Visit (HOSPITAL_COMMUNITY)
Admission: RE | Admit: 2015-10-09 | Discharge: 2015-10-09 | Disposition: A | Discharge status: Home / Self Care | Payer: Medicare Other | Source: Ambulatory Visit | Attending: Internal Medicine | Admitting: Internal Medicine

## 2015-10-09 DIAGNOSIS — I5032 Chronic diastolic (congestive) heart failure: Secondary | ICD-10-CM | POA: Diagnosis not present

## 2015-10-09 LAB — BASIC METABOLIC PANEL
Anion gap: 13 (ref 5–15)
BUN: 39 mg/dL — ABNORMAL HIGH (ref 6–20)
CALCIUM: 9.2 mg/dL (ref 8.9–10.3)
CO2: 29 mmol/L (ref 22–32)
Chloride: 98 mmol/L — ABNORMAL LOW (ref 101–111)
Creatinine, Ser: 2.11 mg/dL — ABNORMAL HIGH (ref 0.61–1.24)
GFR, EST AFRICAN AMERICAN: 36 mL/min — AB (ref >60)
GFR, EST NON AFRICAN AMERICAN: 31 mL/min — AB (ref >60)
GLUCOSE: 95 mg/dL (ref 65–99)
Potassium: 3.1 mmol/L — ABNORMAL LOW (ref 3.5–5.1)
Sodium: 140 mmol/L (ref 135–145)

## 2015-10-12 ENCOUNTER — Ambulatory Visit (INDEPENDENT_AMBULATORY_CARE_PROVIDER_SITE_OTHER): Payer: Medicare Other | Admitting: Internal Medicine

## 2015-10-12 ENCOUNTER — Encounter: Payer: Self-pay | Admitting: Internal Medicine

## 2015-10-12 ENCOUNTER — Telehealth (HOSPITAL_COMMUNITY): Payer: Self-pay

## 2015-10-12 VITALS — BP 124/60 | HR 72 | Ht 68.0 in | Wt 292.4 lb

## 2015-10-12 DIAGNOSIS — N183 Chronic kidney disease, stage 3 unspecified: Secondary | ICD-10-CM

## 2015-10-12 DIAGNOSIS — Z950 Presence of cardiac pacemaker: Secondary | ICD-10-CM | POA: Diagnosis not present

## 2015-10-12 DIAGNOSIS — I5032 Chronic diastolic (congestive) heart failure: Secondary | ICD-10-CM | POA: Diagnosis not present

## 2015-10-12 LAB — CUP PACEART INCLINIC DEVICE CHECK
Battery Voltage: 3.01 V
Brady Statistic RV Percent Paced: 99.81 %
Implantable Lead Location: 753860
Lead Channel Impedance Value: 650 Ohm
Lead Channel Sensing Intrinsic Amplitude: 12 mV
Lead Channel Setting Pacing Amplitude: 0.875
Lead Channel Setting Sensing Sensitivity: 6 mV
MDC IDC LEAD IMPLANT DT: 20150831
MDC IDC LEAD MODEL: 1948
MDC IDC MSMT BATTERY REMAINING LONGEVITY: 133.2
MDC IDC MSMT LEADCHNL RV PACING THRESHOLD AMPLITUDE: 0.625 V
MDC IDC MSMT LEADCHNL RV PACING THRESHOLD PULSEWIDTH: 0.4 ms
MDC IDC SESS DTM: 20170320171626
MDC IDC SET LEADCHNL RV PACING PULSEWIDTH: 0.4 ms
Pulse Gen Model: 1240
Pulse Gen Serial Number: 3014543

## 2015-10-12 MED ORDER — POTASSIUM CHLORIDE CRYS ER 10 MEQ PO TBCR: 100.0000 meq | EXTENDED_RELEASE_TABLET | Freq: Three times a day (TID) | ORAL | Status: DC

## 2015-10-12 NOTE — Patient Instructions (Signed)
Medication Instructions: - Your physician recommends that you continue on your current medications as directed. Please refer to the Current Medication list given to you today.  Labwork: - none  Procedures/Testing: - none  Follow-Up: - Remote monitoring is used to monitor your Pacemaker of ICD from home. This monitoring reduces the number of office visits required to check your device to one time per year. It allows [place] to keep an eye on the functioning of your device to ensure it is working properly. You are scheduled for a device check from home on 01/11/16. You may send your transmission at any time that day. If you have a wireless device, the transmission will be sent automatically. After your physician reviews your transmission, you will receive a postcard with your next transmission date.  - Your physician wants you to follow-up in: 1 year with Dr. Francena Hanly. You will receive a reminder letter in the mail two months in advance. If you don't receive a letter, please call our office to schedule the follow-up appointment.  Any Additional Special Instructions Will Be Listed Below (If Applicable).     If you need a refill on your cardiac medications before your next appointment, please call your pharmacy.

## 2015-10-12 NOTE — Telephone Encounter (Signed)
Recent lab results reviewed with patient. Per Dr. Aundra Dubin, serum K 3.1. Patient denies missing any of his doses (80 meq TID). Per Dr. Aundra Dubin, advised patient to increase dose to 100 meq TID.  Rx updated to preferred pharmacy electronically. Patient aware of results and agreeable to plan of care.  Renee Pain

## 2015-10-12 NOTE — Progress Notes (Signed)
Patient Care Team: [name], MD as PCP - General (Family Medicine) Rogene Houston, MD (Gastroenterology) Nobie Putnam, MD (Hematology and Oncology) Rounding Lbcardiology, MD as Consulting Physician (Cardiology) Sherren Mocha, MD as Consulting Physician (Cardiology)   HPI  [name] is a 70 y.o. male Seen in follow-up for AV junction ablation and pacing undertaken for uncontrolled atrial fibrillation. This is occurring in the context of refractory HFpEF and  prior  bypass surgery undertaken 2004 and catheterization 8/13 demonstrating patent grafts; he is status post AVR.  He has history of CAD s/p CABG, permanent atrial fibrillation, chronic diastolic CHF, severe AS s/p bioprosthetic AVR who presents for evaluation of diastolic CHF. Last LHC in 8/13 showed patent grafts. AVR was in 10/13. Last echo in 8/16 showed EF 55-60%, normal bioprosthetic aortic valve.   He has struggled with flow-volume. He is being diuresed under the care of the heart failure clinic. His creatinine has gone from 1.45-2.1 over the last 3 weeks. Potassium remains low.  He has struggled with intermittent gout. He is copious in his salt intake and as well as his fluid intake.  Last laboratories from 10/15 demonstrated stable renal insufficiency and borderline hypokalemia.  Past Medical History  Diagnosis Date  . Overweight(278.02)   . CAD (coronary artery disease)     a. s/p CABG 2004;  b. [place] 10/13:  LHC 8/13: Free radial to obtuse marginal patent, SVG-diagonal patent, LIMA-LAD patent, EF 65-70%, mean aortic valve gradient 42  . Atrial fibrillation ([place])     Permanent; off of Coumadin for now due to GI bleed  . DM2 (diabetes mellitus, type 2) (HCC)     at least 10 yrs  . Insomnia   . Carotid bruit 06/14/2011    a. pre-AVR dopplers 10/13: no sig ICA stenosis  . Hypertension     x 15 yrs  . Chronic diastolic heart failure ([place])   . GERD (gastroesophageal reflux disease)   . COPD (chronic  obstructive pulmonary disease) ([place])   . Iron deficiency anemia     Requiring intravenous iron  . H/O hiatal hernia   . AVM (arteriovenous malformation)     Recurrent GI bleeding requiring multiple transfusions  . Hyperlipidemia   . Thrombocytopenia ([place])   . Ascites     status post paracentesis with removal of 3.4 L of ascitic fluid  . Osteoarthritis   . Cirrhosis ([place])   . Mediastinal adenopathy 09/22/2011  . Aortic stenosis 03/08/2012    a.  s/p tissue AVR 10/13 with Dr. [name];   b. Echo 10/13: mod LVH, EF 55-60%, tissue AVR not well seen, no leak, gradient not too high (mean 70mmHg), MAC, mild MR, mild LAE, PASP 38  . OSA (obstructive sleep apnea) 1999    DOES NOT USE CPAP    Past Surgical History  Procedure Laterality Date  . Coronary artery bypass graft   10/15/2002     [name]. [name], M.D.     . Carpal tunnel release    10/08/2003  . Lipoma surgery    . Hernia repair    . Other surgical history  08/26/2011    [facility],  enteroscopy , revealing "three-four AVMs."   . Tee without cardioversion  03/07/2012    Procedure: TRANSESOPHAGEAL ECHOCARDIOGRAM (TEE);  Surgeon: Thayer Headings, MD;  Location: [facility] ENDOSCOPY;  Service: Cardiovascular;  Laterality: N/A;  . Coronary angioplasty with stent placement  01/19/2005    drug eluting stent to high grade  ostial stenosis of radial artery graft to OM  . Aortic valve replacement  04/25/2012    Procedure: AORTIC VALVE REPLACEMENT (AVR);  Surgeon: Melrose Nakayama, MD;  Location: [place];  Service: Open Heart Surgery;  Laterality: N/A;  . Esophagogastroduodenoscopy N/A 07/31/2013    Procedure: ESOPHAGOGASTRODUODENOSCOPY (EGD);  Surgeon: Milus Banister, MD;  Location: [place];  Service: Endoscopy;  Laterality: N/A;  . Esophagogastroduodenoscopy Left 01/11/2014    Procedure: ESOPHAGOGASTRODUODENOSCOPY (EGD);  Surgeon: Juanita Craver, MD;  Location: [facility] ENDOSCOPY;  Service: Endoscopy;  Laterality: Left;  . Pacemaker insertion  03-24-14     STJ Assurity single chamber pacemaker implanted by Dr Caryl Comes  . Ablation  04-18-14    AVN ablation by Dr Caryl Comes  . Left and right heart catheterization with coronary/graft angiogram N/A 03/07/2012    Procedure: LEFT AND RIGHT HEART CATHETERIZATION WITH Beatrix Fetters;  Surgeon: Sherren Mocha, MD;  Location: [facility] CATH LAB;  Service: Cardiovascular;  Laterality: N/A;  . Right heart catheterization N/A 01/01/2014    Procedure: RIGHT HEART CATH;  Surgeon: Jolaine Artist, MD;  Location: [facility] CATH LAB;  Service: Cardiovascular;  Laterality: N/A;  . Permanent pacemaker insertion N/A 03/24/2014    Procedure: PERMANENT PACEMAKER INSERTION;  Surgeon: Deboraha Sprang, MD;  Location: [facility] CATH LAB;  Service: Cardiovascular;  Laterality: N/A;  . Av node ablation N/A 04/18/2014    Procedure: AV NODE ABLATION;  Surgeon: Deboraha Sprang, MD;  Location: [facility] CATH LAB;  Service: Cardiovascular;  Laterality: N/A;    Current Outpatient Prescriptions  Medication Sig Dispense Refill  . acetaminophen (TYLENOL) 500 MG tablet Take 2,000 mg by mouth at bedtime as needed for mild pain or headache.    . allopurinol (ZYLOPRIM) 100 MG tablet TAKE 1 TABLET (100 MG TOTAL) BY MOUTH DAILY. 30 tablet 6  . atorvastatin (LIPITOR) 20 MG tablet Take 20 mg by mouth daily at 6 PM.     . benzonatate (TESSALON) 200 MG capsule Take 1 capsule (200 mg total) by mouth 3 (three) times daily as needed for cough. 40 capsule 1  . colchicine 0.6 MG tablet Take 0.6 mg by mouth daily.     . ferrous gluconate (FERGON) 240 (27 FE) MG tablet Take 240 mg by mouth 2 (two) times daily.    [name] gabapentin (NEURONTIN) 300 MG capsule Take 300 mg by mouth 3 (three) times daily.  6  . insulin NPH Human (HUMULIN N,NOVOLIN N) 100 UNIT/ML injection Inject 30 Units into the skin 2 (two) times daily before a meal.     . insulin regular (NOVOLIN R,HUMULIN R) 100 units/mL injection Inject 20 Units into the skin 2 (two) times daily before a meal. Per sliding scale     . ipratropium (ATROVENT) 0.06 % nasal spray Place 2 sprays into both nostrils daily.    . metolazone (ZAROXOLYN) 2.5 MG tablet Take 1 tablet (2.5 mg total) by mouth 3 (three) times a week. Take every Mon, Wed and Sat 15 tablet 6  . metoprolol succinate (TOPROL-XL) 100 MG 24 hr tablet Take 1 tablet (100 mg total) by mouth 2 (two) times daily. 60 tablet 6  . Multiple Vitamins-Minerals ([employer] SPECTRAVITE ADULT 50+ PO) Take 1 tablet by mouth daily.    [name] omeprazole (PRILOSEC) 20 MG capsule Take 1 capsule (20 mg total) by mouth 2 (two) times daily before a meal. 30 capsule 5  . potassium chloride (K-DUR,KLOR-CON) 10 MEQ tablet Take 10 tablets (100 mEq total) by mouth 3 (three) times daily.  Take an extra 4 tabs on Wed and Sat with Metolazone 950 tablet 6  . sertraline (ZOLOFT) 50 MG tablet Take 50 mg by mouth daily.    [name] spironolactone (ALDACTONE) 25 MG tablet TAKE 1/2 TABLET BY MOUTH EVERY DAY 15 tablet 5  . torsemide (DEMADEX) 20 MG tablet TAKE 6 TABLETS IN THE MORNING AND 3 TABLETS IN THE EVENING 270 tablet 3  . vitamin C (ASCORBIC ACID) 500 MG tablet Take 500 mg by mouth 2 (two) times daily.     No current facility-administered medications for this visit.    Allergies  Allergen Reactions  . Codeine Other (See Comments) and Palpitations    Hurting in chest  . Diltiazem Hcl Itching  . Niacin Other (See Comments)    Felt a "severe burning sensation" Hot flashes  . Diltiazem Hcl Other (See Comments) and Hives    Gets hot    Review of Systems negative except from HPI and PMH  Physical Exam BP 124/60 mmHg  Pulse 72  Ht 5\' 8"  (1.727 m)  Wt 292 lb 6.4 oz (132.632 kg)  BMI 44.47 kg/m2 Well developed and well nourished in no acute distress HENT normal E scleral and icterus clear Neck Supple JVP flat; carotids brisk and full Clear to ausculation  Regular rate and rhythm, no murmurs gallops or rub Soft with active bowel sounds No clubbing cyanosis 1+ Edema Alert and oriented, grossly  normal motor and sensory function Skin Warm and Dry    Assessment and  Plan AV junction ablation  stable  Congestive heart failure-chronic-diastolic   Atrial fibrillation-permanent  Ischemic Heart Disease prior CABG  Renal insufficiency grade 3  Pacemaker St Jude  The patient's device was interrogated.  The information was reviewed. No changes were made in the programming.     Gout  He is volume overloaded.     I've been in touch with the heart failure clinic regarding follow-up labs. We'll see him in a year.

## 2015-10-13 DIAGNOSIS — M25561 Pain in right knee: Secondary | ICD-10-CM | POA: Diagnosis not present

## 2015-10-19 DIAGNOSIS — H2511 Age-related nuclear cataract, right eye: Secondary | ICD-10-CM | POA: Diagnosis not present

## 2015-10-19 DIAGNOSIS — Z961 Presence of intraocular lens: Secondary | ICD-10-CM | POA: Diagnosis not present

## 2015-10-19 DIAGNOSIS — H25811 Combined forms of age-related cataract, right eye: Secondary | ICD-10-CM | POA: Diagnosis not present

## 2015-10-27 ENCOUNTER — Inpatient Hospital Stay (HOSPITAL_COMMUNITY): Admission: RE | Admit: 2015-10-27 | Payer: Medicare Other | Source: Ambulatory Visit

## 2015-10-28 ENCOUNTER — Ambulatory Visit (HOSPITAL_COMMUNITY)
Admission: RE | Admit: 2015-10-28 | Discharge: 2015-10-28 | Disposition: A | Discharge status: Home / Self Care | Payer: Medicare Other | Source: Ambulatory Visit | Attending: Cardiology | Admitting: Cardiology

## 2015-10-28 ENCOUNTER — Encounter (HOSPITAL_COMMUNITY): Payer: Self-pay

## 2015-10-28 VITALS — BP 120/66 | HR 96 | Wt 287.0 lb

## 2015-10-28 DIAGNOSIS — G4733 Obstructive sleep apnea (adult) (pediatric): Secondary | ICD-10-CM | POA: Diagnosis not present

## 2015-10-28 DIAGNOSIS — I5032 Chronic diastolic (congestive) heart failure: Secondary | ICD-10-CM | POA: Diagnosis not present

## 2015-10-28 DIAGNOSIS — E876 Hypokalemia: Secondary | ICD-10-CM | POA: Insufficient documentation

## 2015-10-28 DIAGNOSIS — Z79899 Other long term (current) drug therapy: Secondary | ICD-10-CM | POA: Diagnosis not present

## 2015-10-28 DIAGNOSIS — J449 Chronic obstructive pulmonary disease, unspecified: Secondary | ICD-10-CM | POA: Insufficient documentation

## 2015-10-28 DIAGNOSIS — Z8249 Family history of ischemic heart disease and other diseases of the circulatory system: Secondary | ICD-10-CM | POA: Diagnosis not present

## 2015-10-28 DIAGNOSIS — I482 Chronic atrial fibrillation: Secondary | ICD-10-CM | POA: Insufficient documentation

## 2015-10-28 DIAGNOSIS — I10 Essential (primary) hypertension: Secondary | ICD-10-CM

## 2015-10-28 DIAGNOSIS — Z87891 Personal history of nicotine dependence: Secondary | ICD-10-CM | POA: Insufficient documentation

## 2015-10-28 DIAGNOSIS — M109 Gout, unspecified: Secondary | ICD-10-CM | POA: Diagnosis not present

## 2015-10-28 DIAGNOSIS — K7581 Nonalcoholic steatohepatitis (NASH): Secondary | ICD-10-CM | POA: Diagnosis not present

## 2015-10-28 DIAGNOSIS — E119 Type 2 diabetes mellitus without complications: Secondary | ICD-10-CM | POA: Diagnosis not present

## 2015-10-28 DIAGNOSIS — I130 Hypertensive heart and chronic kidney disease with heart failure and stage 1 through stage 4 chronic kidney disease, or unspecified chronic kidney disease: Secondary | ICD-10-CM | POA: Diagnosis not present

## 2015-10-28 DIAGNOSIS — Z950 Presence of cardiac pacemaker: Secondary | ICD-10-CM | POA: Insufficient documentation

## 2015-10-28 DIAGNOSIS — Z794 Long term (current) use of insulin: Secondary | ICD-10-CM | POA: Insufficient documentation

## 2015-10-28 DIAGNOSIS — Z953 Presence of xenogenic heart valve: Secondary | ICD-10-CM | POA: Insufficient documentation

## 2015-10-28 DIAGNOSIS — Z951 Presence of aortocoronary bypass graft: Secondary | ICD-10-CM | POA: Diagnosis not present

## 2015-10-28 DIAGNOSIS — K219 Gastro-esophageal reflux disease without esophagitis: Secondary | ICD-10-CM | POA: Diagnosis not present

## 2015-10-28 DIAGNOSIS — E785 Hyperlipidemia, unspecified: Secondary | ICD-10-CM | POA: Insufficient documentation

## 2015-10-28 DIAGNOSIS — E663 Overweight: Secondary | ICD-10-CM

## 2015-10-28 DIAGNOSIS — R631 Polydipsia: Secondary | ICD-10-CM | POA: Insufficient documentation

## 2015-10-28 DIAGNOSIS — I5033 Acute on chronic diastolic (congestive) heart failure: Secondary | ICD-10-CM

## 2015-10-28 DIAGNOSIS — I2510 Atherosclerotic heart disease of native coronary artery without angina pectoris: Secondary | ICD-10-CM | POA: Insufficient documentation

## 2015-10-28 DIAGNOSIS — N183 Chronic kidney disease, stage 3 (moderate): Secondary | ICD-10-CM

## 2015-10-28 DIAGNOSIS — I25812 Atherosclerosis of bypass graft of coronary artery of transplanted heart without angina pectoris: Secondary | ICD-10-CM

## 2015-10-28 LAB — BASIC METABOLIC PANEL
ANION GAP: 13 (ref 5–15)
BUN: 27 mg/dL — AB (ref 6–20)
CHLORIDE: 99 mmol/L — AB (ref 101–111)
CO2: 29 mmol/L (ref 22–32)
Calcium: 9.1 mg/dL (ref 8.9–10.3)
Creatinine, Ser: 1.79 mg/dL — ABNORMAL HIGH (ref 0.61–1.24)
GFR calc Af Amer: 43 mL/min — ABNORMAL LOW (ref >60)
GFR calc non Af Amer: 38 mL/min — ABNORMAL LOW (ref >60)
GLUCOSE: 162 mg/dL — AB (ref 65–99)
POTASSIUM: 3.3 mmol/L — AB (ref 3.5–5.1)
Sodium: 141 mmol/L (ref 135–145)

## 2015-10-28 NOTE — Progress Notes (Signed)
Patient ID: [name], male   DOB: Aug 13, 1945, 70 y.o.   MRN: [id number]    Advanced Heart Failure Clinic Note   GI: Dr Laural Golden- followed for cirrhosis Pulmonary: Dr Melvyn Novas  PCP: Marjean Donna [facility] at [facility])  Cardiology: Dr. Aundra Dubin  Mr Knobel is a 70 yo with history of CAD s/p CABG, permanent atrial fibrillation, chronic diastolic CHF, severe AS s/p bioprosthetic AVR who presents for evaluation of diastolic CHF.  Last LHC in 8/13 showed patent grafts.  AVR was in 10/13.  Last echo in 8/16 showed EF 55-60%, normal bioprosthetic aortic valve.   July 2015:  Admitted to the hospital for GI bleed requiring 2 PRBCs. No additional GI work-up with recent EGD showing gastritis.  He had an AV nodal ablation in 9/15 with significant improvement after this.  He has St Jude PPM.   He was admitted in 8/16 with volume overload and diuresed.    He returns for HF follow up. At last visit metolazone increased to M/W/F. Weight still up and down. Up 2 lbs since last visit. Breathing is OK. SOB with minimal exertion, changing clothes, or showering.  Sleeps in adjustable bed with head raised. Gout has gotten a lot better. Is moving a round a little more, but still dyspneic with short distances. + Bendopnea. Abdomen stays distended, doesn't get much peripheral edema. Drank 157 oz of fluid yesterday.  Has upwards up 200 oz on some day. He drank 48 oz of mountain dew, 60 ozs of seltzer water, and 57 ozs of water.   Labs (8/14) LDL 38 Labs (3/15) K 3.4, creatinine 1.7, BUN 55  Labs (10/15/13) K 4.0 Creatinine 1.6 BUN 32 Labs (10/29/13) K 4.0 Creatinine 1.6  Labs (4/15) K 4.2 Creatinine 1.32 => 1.5, HCT 35.2 Labs (12/11/13) K 3.6 Creatinine 1.56 Pro BNP 1152 Hemoglobin 9.6  Labs (01/12/14) K 3.0, creatinine 2.0, hemoglobin 8.8 Labs (01/31/14) K 3.3, creatinine 1.49 Labs (8/15) K 3.9, creatinine 1.89 Labs (9/15): K 3.7, creatinine 1.6, AST 48, ALT 52, TSH normal Labs (10/15): K 3.5, creatinine 1.58, BNP 780 Labs  (3/16): K 3.5, creatinine 1.6 Labs 12/03/2014: K 4.4 Creatinine 1.55 Labs 12/12/2014: K 3.7 Creatinine 1.48, LDL 50 Labs 8/16: K 3.8, creatinine 2.42 Labs 04/07/2015: K 4.3, Creatinine 2.22, BNP 169 Labs (11/16): K 4, creatinine 1.78 => 1.55, BNP 120  PMH: 1. CAD: s/p CABG 2004. Last LHC (8/13) with patent free radial-OM, patent SVG-D, patent LIMA-LAD.  2. Permanent atrial fibrillation: Not anticoagulated due to GI bleeding.  Holter monitor (4/15) with mean HR 115 (afib).   He had AV nodal ablation with St Jude dual chamber PPM  3. Chronic diastolic CHF: Echo (123456) with EF 55-60%, bioprosthetic aortic valve with mean gradient 22 mmHg, mildly decreased RV systolic function. RHC (12/2013): RA 14, RV 44/4/11, PA 50/23 (33), PCWP 20, v = 35, Fick CO/CI: 6.0/2.6, PVR 2.2 WU, PA 59%.  Echo (8/15) with EF 55-60%, mild LVH, bioprosthetic aortic valve with mean gradient 16 mmHg, PA systolic pressure 46 mmHg, mild to moderate MR.  Echo (8/16) with EF 55-60%, normal bioprosthetic aortic valve, mild MR, PASP 44 mmHg.  4. Severe aortic stenosis s/p bioprosthetic AVR in 10/13. Mean gradient 22 mmHg across valve on echo in 8/14.  Mean gradient 16 mmHg on echo in 8/15.  5. NASH: ascites, thrombocytopenia.  Had paracentesis in 1/15. Gynecomastia with spironolactone.  6. 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  EGD in 1/15 showed mild portal gastropathy and 2 small  antral ulcers. Recurrent GI bleed in 6/15, EGD showed gastritis and ?GAVE.  7. Hyperlipidemia 8. HTN 9. GERD 10. COPD 68. OSA: Cannot tolerate CPAP.  12. CKD 13. Gout 14. Carotid dopplers (11/15) with minimal stenosis.  15. ABI 12/11/2014: normal    SH: Lives with wife in [place], prior smoker.   FH: CAD  ROS: All systems reviewed and negative except as per HPI.   Current Outpatient Prescriptions on File Prior to Encounter  Medication Sig Dispense Refill  . acetaminophen (TYLENOL) 500 MG tablet Take 2,000 mg by mouth at bedtime as needed for mild  pain or headache.    . allopurinol (ZYLOPRIM) 100 MG tablet TAKE 1 TABLET (100 MG TOTAL) BY MOUTH DAILY. 30 tablet 6  . atorvastatin (LIPITOR) 20 MG tablet Take 20 mg by mouth daily at 6 PM.     . benzonatate (TESSALON) 200 MG capsule Take 1 capsule (200 mg total) by mouth 3 (three) times daily as needed for cough. 40 capsule 1  . colchicine 0.6 MG tablet Take 0.6 mg by mouth daily.     . ferrous gluconate (FERGON) 240 (27 FE) MG tablet Take 240 mg by mouth 2 (two) times daily.    [name] gabapentin (NEURONTIN) 300 MG capsule Take 300 mg by mouth 3 (three) times daily.  6  . insulin NPH Human (HUMULIN N,NOVOLIN N) 100 UNIT/ML injection Inject 30 Units into the skin 2 (two) times daily before a meal.     . insulin regular (NOVOLIN R,HUMULIN R) 100 units/mL injection Inject 20 Units into the skin 2 (two) times daily before a meal. Per sliding scale    . ipratropium (ATROVENT) 0.06 % nasal spray Place 2 sprays into both nostrils daily.    . metolazone (ZAROXOLYN) 2.5 MG tablet Take 1 tablet (2.5 mg total) by mouth 3 (three) times a week. Take every Mon, Wed and Sat 15 tablet 6  . metoprolol succinate (TOPROL-XL) 100 MG 24 hr tablet Take 1 tablet (100 mg total) by mouth 2 (two) times daily. 60 tablet 6  . Multiple Vitamins-Minerals ([employer] SPECTRAVITE ADULT 50+ PO) Take 1 tablet by mouth daily.    [name] omeprazole (PRILOSEC) 20 MG capsule Take 1 capsule (20 mg total) by mouth 2 (two) times daily before a meal. 30 capsule 5  . potassium chloride (K-DUR,KLOR-CON) 10 MEQ tablet Take 10 tablets (100 mEq total) by mouth 3 (three) times daily. Take an extra 4 tabs on Wed and Sat with Metolazone 950 tablet 6  . sertraline (ZOLOFT) 50 MG tablet Take 50 mg by mouth daily.    [name] spironolactone (ALDACTONE) 25 MG tablet TAKE 1/2 TABLET BY MOUTH EVERY DAY 15 tablet 5  . torsemide (DEMADEX) 20 MG tablet TAKE 6 TABLETS IN THE MORNING AND 3 TABLETS IN THE EVENING 270 tablet 3  . vitamin C (ASCORBIC ACID) 500 MG tablet Take 500 mg  by mouth 2 (two) times daily.     No current facility-administered medications on file prior to encounter.    Filed Vitals:   10/28/15 0945  BP: 120/66  Pulse: 96  Weight: 287 lb (130.182 kg)  SpO2: 93%   Wt Readings from Last 3 Encounters:  10/28/15 287 lb (130.182 kg)  10/12/15 292 lb 6.4 oz (132.632 kg)  09/25/15 276 lb 3.2 oz (125.283 kg)     General: NAD. Wife present Neck: JVP 8-9 cm, no thyromegaly or thyroid nodule.  Lungs: Clear to auscultation bilaterally with normal respiratory effort. CV: Nondisplaced PMI.  Irregular  S1/S2, no S3/S4, 2/6 early SEM RUSB.  No edema.  Left carotid bruit.  1+ PT pulses bilaterally.  Abdomen: Soft, obese nontender, no hepatosplenomegaly.  Mildly distended.  Skin: Intact without lesions or rashes.  Neurologic: Alert and oriented x 3.  Psych: Normal affect. Extremities: No clubbing or cyanosis. Left ankle is warm and tender.   Assessment/Plan: 1. Chronic diastolic CHF:  EF 0000000 (02/2015).  NYHA class III symptoms.   On exam, he remains mildly volume overloaded.  - Continue torsemide 120 qam/60 qpm.  - Continue metolazone to tiw, Monday/Wednesday/Saturday.  - BMET today - He drinks excessive amounts of fluid. Counseled on restrictions and will follow up in 2 weeks.  2. Chronic atrial fibrillation:  CHADSVASC 4 - age > 46, CHF, HTN, DM2. 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 We tried ASA, however it was stopped again due to GIB. HR was very difficult to control, concerned that elevated HR contributed to CHF and exercise intolerance.  Therefore, he had AV nodal ablation with dual chamber pacing.   3. CKD stage III: BMET today.   4. Bioprosthetic AVR: 8/16 echo showed stable bioprosthetic aortic valve.   5. CAD: s/p CABG. No chest pain.  - Continue atorvastatin 20 mg daily and Toprol XL 100 mg BID. He has been off aspirin and anticoagulation with history of GI bleeding.  6. Cirrhosis:  NASH-related. Sees Dr. Laural Golden.   7. Gout:  - Improved    8. Leg pain:  Had ABI 5/16--> Normal.   9. Hypokalemia - Repeat BMET today. May need to spread out potassium dosing. Hesitant to add spiro at this time with his CKD.  10. Polydipsia  - Last Hgb A1C 7.6. Blood sugars in 150s in the mornings.  - Need follow up.  Follow up in 2 wks for recheck. Extensively educated on fluid restriction. BMET today.  Will not change his diuretics with extensive fluid intake.  Suspect this also plays large part in his electrolytes.   Satira Mccallum Mandee Pluta PA-C 10/28/2015  Total time spent 30 minutes, over half of that spent discussing the above.

## 2015-10-28 NOTE — Patient Instructions (Signed)
Labs today  .Your physician recommends that you schedule a follow-up appointment in: 2 weeks with Oda Kilts, PA  Do the following things EVERYDAY: 1) Weigh yourself in the morning before breakfast. Write it down and keep it in a log. 2) Take your medicines as prescribed 3) Eat low salt foods-Limit salt (sodium) to 2000 mg per day.  4) Stay as active as you can everyday 5) Limit all fluids for the day to less than 2 liters 6)  7)

## 2015-11-02 DIAGNOSIS — N189 Chronic kidney disease, unspecified: Secondary | ICD-10-CM | POA: Diagnosis not present

## 2015-11-02 DIAGNOSIS — I10 Essential (primary) hypertension: Secondary | ICD-10-CM | POA: Diagnosis not present

## 2015-11-02 DIAGNOSIS — E7800 Pure hypercholesterolemia, unspecified: Secondary | ICD-10-CM | POA: Diagnosis not present

## 2015-11-02 DIAGNOSIS — E1165 Type 2 diabetes mellitus with hyperglycemia: Secondary | ICD-10-CM | POA: Diagnosis not present

## 2015-11-10 NOTE — Progress Notes (Signed)
Patient ID: [name], male   DOB: 1945/08/22, 70 y.o.   MRN: [id number]    Advanced Heart Failure Clinic Note   GI: Dr Laural Golden- followed for cirrhosis Pulmonary: Dr Melvyn Novas  PCP: Marjean Donna [facility])  Cardiology: Dr. Aundra Dubin  [name] is a 70 yo with history of CAD s/p CABG, permanent atrial fibrillation, chronic diastolic CHF, severe AS s/p bioprosthetic AVR who presents for evaluation of diastolic CHF.  Last LHC in 8/13 showed patent grafts.  AVR was in 10/13.  Last echo in 8/16 showed EF 55-60%, normal bioprosthetic aortic valve.   July 2015:  Admitted to the hospital for GI bleed requiring 2 PRBCs. No additional GI work-up with recent EGD showing gastritis.  He had an AV nodal ablation in 9/15 with significant improvement after this.  He has St Jude PPM.   He was admitted in 8/16 with volume overload and diuresed.    He returns for HF follow up. At last visit was noted to have excessive fluid intake and underwent extensive counseling concerning fluid restriction He is down 3 lbs since last visit. Still drinking between 150 - 200 oz of fluid a day.  On average drinking at least 150 every day. Did have follow up with her PCP, states his numbers were OK. Recent A1C was 5.5.  Abdomen remains distended.  Still SOB with minimal exertion and ADLs.  Sleeps with head raised. + Bendopnea.    Labs (8/14) LDL 38 Labs (3/15) K 3.4, creatinine 1.7, BUN 55  Labs (10/15/13) K 4.0 Creatinine 1.6 BUN 32 Labs (10/29/13) K 4.0 Creatinine 1.6  Labs (4/15) K 4.2 Creatinine 1.32 => 1.5, HCT 35.2 Labs (12/11/13) K 3.6 Creatinine 1.56 Pro BNP 1152 Hemoglobin 9.6  Labs (01/12/14) K 3.0, creatinine 2.0, hemoglobin 8.8 Labs (01/31/14) K 3.3, creatinine 1.49 Labs (8/15) K 3.9, creatinine 1.89 Labs (9/15): K 3.7, creatinine 1.6, AST 48, ALT 52, TSH normal Labs (10/15): K 3.5, creatinine 1.58, BNP 780 Labs (3/16): K 3.5, creatinine 1.6 Labs 12/03/2014: K 4.4 Creatinine 1.55 Labs 12/12/2014: K 3.7  Creatinine 1.48, LDL 50 Labs 8/16: K 3.8, creatinine 2.42 Labs 04/07/2015: K 4.3, Creatinine 2.22, BNP 169 Labs (11/16): K 4, creatinine 1.78 => 1.55, BNP 120  PMH: 1. CAD: s/p CABG 2004. Last LHC (8/13) with patent free radial-OM, patent SVG-D, patent LIMA-LAD.  2. Permanent atrial fibrillation: Not anticoagulated due to GI bleeding.  Holter monitor (4/15) with mean HR 115 (afib).   He had AV nodal ablation with St Jude dual chamber PPM  3. Chronic diastolic CHF: Echo (123456) with EF 55-60%, bioprosthetic aortic valve with mean gradient 22 mmHg, mildly decreased RV systolic function. RHC (12/2013): RA 14, RV 44/4/11, PA 50/23 (33), PCWP 20, v = 35, Fick CO/CI: 6.0/2.6, PVR 2.2 WU, PA 59%.  Echo (8/15) with EF 55-60%, mild LVH, bioprosthetic aortic valve with mean gradient 16 mmHg, PA systolic pressure 46 mmHg, mild to moderate Matthew.  Echo (8/16) with EF 55-60%, normal bioprosthetic aortic valve, mild Matthew, PASP 44 mmHg.  4. Severe aortic stenosis s/p bioprosthetic AVR in 10/13. Mean gradient 22 mmHg across valve on echo in 8/14.  Mean gradient 16 mmHg on echo in 8/15.  5. NASH: ascites, thrombocytopenia.  Had paracentesis in 1/15. Gynecomastia with spironolactone.  6. 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  EGD in 1/15 showed mild portal gastropathy and 2 small antral ulcers. Recurrent GI bleed in 6/15, EGD showed gastritis and ?GAVE.  7. Hyperlipidemia 8. HTN  9. GERD 10. COPD 38. OSA: Cannot tolerate CPAP.  12. CKD 13. Gout 14. Carotid dopplers (11/15) with minimal stenosis.  15. ABI 12/11/2014: normal    SH: Lives with wife in [place], prior smoker.   FH: CAD  ROS: All systems reviewed and negative except as per HPI.   Current Outpatient Prescriptions on File Prior to Encounter  Medication Sig Dispense Refill  . acetaminophen (TYLENOL) 500 MG tablet Take 2,000 mg by mouth at bedtime as needed for mild pain or headache.    . allopurinol (ZYLOPRIM) 100 MG tablet TAKE 1 TABLET (100 MG TOTAL) BY  MOUTH DAILY. 30 tablet 6  . atorvastatin (LIPITOR) 20 MG tablet Take 20 mg by mouth daily at 6 PM.     . benzonatate (TESSALON) 200 MG capsule Take 1 capsule (200 mg total) by mouth 3 (three) times daily as needed for cough. 40 capsule 1  . colchicine 0.6 MG tablet Take 0.6 mg by mouth daily.     . ferrous gluconate (FERGON) 240 (27 FE) MG tablet Take 240 mg by mouth 2 (two) times daily.    [name] gabapentin (NEURONTIN) 300 MG capsule Take 300 mg by mouth 3 (three) times daily.  6  . insulin NPH Human (HUMULIN N,NOVOLIN N) 100 UNIT/ML injection Inject 30 Units into the skin 2 (two) times daily before a meal.     . insulin regular (NOVOLIN R,HUMULIN R) 100 units/mL injection Inject 20 Units into the skin 2 (two) times daily before a meal. Per sliding scale    . ipratropium (ATROVENT) 0.06 % nasal spray Place 2 sprays into both nostrils daily.    . metolazone (ZAROXOLYN) 2.5 MG tablet Take 1 tablet (2.5 mg total) by mouth 3 (three) times a week. Take every Mon, Wed and Sat 15 tablet 6  . metoprolol succinate (TOPROL-XL) 100 MG 24 hr tablet Take 1 tablet (100 mg total) by mouth 2 (two) times daily. 60 tablet 6  . Multiple Vitamins-Minerals (CVS SPECTRAVITE ADULT 50+ PO) Take 1 tablet by mouth daily.    [name] omeprazole (PRILOSEC) 20 MG capsule Take 1 capsule (20 mg total) by mouth 2 (two) times daily before a meal. 30 capsule 5  . sertraline (ZOLOFT) 50 MG tablet Take 50 mg by mouth daily.    [name] spironolactone (ALDACTONE) 25 MG tablet TAKE 1/2 TABLET BY MOUTH EVERY DAY 15 tablet 5  . torsemide (DEMADEX) 20 MG tablet TAKE 6 TABLETS IN THE MORNING AND 3 TABLETS IN THE EVENING 270 tablet 3  . vitamin C (ASCORBIC ACID) 500 MG tablet Take 500 mg by mouth 2 (two) times daily.     No current facility-administered medications on file prior to encounter.    Filed Vitals:   11/11/15 0943  BP: 100/70  Pulse: 99  Weight: 284 lb 3.2 oz (128.912 kg)  SpO2: 94%   Wt Readings from Last 3 Encounters:  11/11/15 284 lb  3.2 oz (128.912 kg)  10/28/15 287 lb (130.182 kg)  10/12/15 292 lb 6.4 oz (132.632 kg)     General: NAD. Wife present Neck: JVP 8-9 cm, no thyromegaly or thyroid nodule.  Lungs: CTAB, normal effort CV: Nondisplaced PMI.  Irregular S1/S2, no S3/S4, 2/6 early SEM RUSB.  No edema.  Left carotid bruit.  1+ PT pulses bilaterally.  Abdomen: Soft, obese nontender, no hepatosplenomegaly.  Moderately distended Skin: Intact without lesions or rashes.  Neurologic: Alert and oriented x 3.  Psych: Normal affect. Extremities: No clubbing or cyanosis. Left ankle is  warm and tender.   Assessment/Plan: 1. Chronic diastolic CHF:  EF 0000000 (02/2015).  NYHA class III symptoms.    - Volume status somewhat improved with increased fluid restrictions. Encouraged patient to continue limiting his fluids.  - Continue torsemide 120 qam/60 qpm.  - Change potassium to 20 meq pills and refill as he has been running out. Continue 100 meq TID with extra 40 meq on metolazone days.  - Continue metolazone tiw for now. Monday/Wednesday/Saturday.  - Recheck BMET today. - Would like to check Echo once he has better control of his fluid status.  2. Chronic atrial fibrillation:  CHADSVASC 4 - age > 46, CHF, HTN, DM2. 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 We tried ASA, however it was stopped again due to GIB. HR was very difficult to control, concerned that elevated HR contributed to CHF and exercise intolerance.  Therefore, he had AV nodal ablation with dual chamber pacing.   3. CKD stage III: BMET today.   4. Bioprosthetic AVR: 8/16 echo showed stable bioprosthetic aortic valve.   5. CAD: s/p CABG. No chest pain.  - Continue atorvastatin 20 mg daily and Toprol XL 100 mg BID. He has been off aspirin and anticoagulation with history of GI bleeding.  6. Cirrhosis: NASH-related. Sees Dr. Laural Golden.   7. Gout:  - Improved    8. Leg pain:  Had ABI 5/16--> Normal.   9.  Hypokalemia - Repeat BMET today.  10. DM2  - Recent Hgb A1C 5.5. Per PCP.  11. HTN - Stable on current meds 12. HLD Continue atorvastatin 20 mg daily 13. Polydipsia - Drinks upward of 150-200 oz a day. Counseled extensively on limiting his fluid intake.    BMET today. Slightly improved. Watching fluid but still drinking incredible amounts of fluid each day. Had, again, extensive counseling on fluid restriction and it likely being the primary driving factor of his symptoms. Will follow up in 1 month.   Satira Mccallum Kilah Drahos PA-C 11/11/2015   Total time spent 30 minutes, over half that spent discussing the above, including in depth discussion of disease state and necessity for fluid restriction.

## 2015-11-11 ENCOUNTER — Ambulatory Visit (HOSPITAL_COMMUNITY)
Admission: RE | Admit: 2015-11-11 | Discharge: 2015-11-11 | Disposition: A | Discharge status: Home / Self Care | Payer: Medicare Other | Source: Ambulatory Visit | Attending: Cardiology | Admitting: Cardiology

## 2015-11-11 VITALS — BP 100/70 | HR 99 | Wt 284.2 lb

## 2015-11-11 DIAGNOSIS — E118 Type 2 diabetes mellitus with unspecified complications: Secondary | ICD-10-CM

## 2015-11-11 DIAGNOSIS — I482 Chronic atrial fibrillation, unspecified: Secondary | ICD-10-CM

## 2015-11-11 DIAGNOSIS — J449 Chronic obstructive pulmonary disease, unspecified: Secondary | ICD-10-CM | POA: Insufficient documentation

## 2015-11-11 DIAGNOSIS — K219 Gastro-esophageal reflux disease without esophagitis: Secondary | ICD-10-CM | POA: Diagnosis not present

## 2015-11-11 DIAGNOSIS — Z794 Long term (current) use of insulin: Secondary | ICD-10-CM | POA: Diagnosis not present

## 2015-11-11 DIAGNOSIS — E1122 Type 2 diabetes mellitus with diabetic chronic kidney disease: Secondary | ICD-10-CM | POA: Diagnosis not present

## 2015-11-11 DIAGNOSIS — Z951 Presence of aortocoronary bypass graft: Secondary | ICD-10-CM

## 2015-11-11 DIAGNOSIS — Z79899 Other long term (current) drug therapy: Secondary | ICD-10-CM | POA: Diagnosis not present

## 2015-11-11 DIAGNOSIS — E876 Hypokalemia: Secondary | ICD-10-CM | POA: Insufficient documentation

## 2015-11-11 DIAGNOSIS — N183 Chronic kidney disease, stage 3 (moderate): Secondary | ICD-10-CM

## 2015-11-11 DIAGNOSIS — I2510 Atherosclerotic heart disease of native coronary artery without angina pectoris: Secondary | ICD-10-CM | POA: Insufficient documentation

## 2015-11-11 DIAGNOSIS — Z950 Presence of cardiac pacemaker: Secondary | ICD-10-CM | POA: Diagnosis not present

## 2015-11-11 DIAGNOSIS — Z953 Presence of xenogenic heart valve: Secondary | ICD-10-CM | POA: Diagnosis not present

## 2015-11-11 DIAGNOSIS — I350 Nonrheumatic aortic (valve) stenosis: Secondary | ICD-10-CM

## 2015-11-11 DIAGNOSIS — G4733 Obstructive sleep apnea (adult) (pediatric): Secondary | ICD-10-CM | POA: Insufficient documentation

## 2015-11-11 DIAGNOSIS — K7460 Unspecified cirrhosis of liver: Secondary | ICD-10-CM | POA: Insufficient documentation

## 2015-11-11 DIAGNOSIS — I5032 Chronic diastolic (congestive) heart failure: Secondary | ICD-10-CM | POA: Insufficient documentation

## 2015-11-11 DIAGNOSIS — E785 Hyperlipidemia, unspecified: Secondary | ICD-10-CM | POA: Diagnosis not present

## 2015-11-11 DIAGNOSIS — I10 Essential (primary) hypertension: Secondary | ICD-10-CM

## 2015-11-11 DIAGNOSIS — I5033 Acute on chronic diastolic (congestive) heart failure: Secondary | ICD-10-CM

## 2015-11-11 DIAGNOSIS — Z8249 Family history of ischemic heart disease and other diseases of the circulatory system: Secondary | ICD-10-CM | POA: Insufficient documentation

## 2015-11-11 DIAGNOSIS — E663 Overweight: Secondary | ICD-10-CM

## 2015-11-11 DIAGNOSIS — I130 Hypertensive heart and chronic kidney disease with heart failure and stage 1 through stage 4 chronic kidney disease, or unspecified chronic kidney disease: Secondary | ICD-10-CM | POA: Insufficient documentation

## 2015-11-11 DIAGNOSIS — K7581 Nonalcoholic steatohepatitis (NASH): Secondary | ICD-10-CM | POA: Diagnosis not present

## 2015-11-11 DIAGNOSIS — M109 Gout, unspecified: Secondary | ICD-10-CM | POA: Diagnosis not present

## 2015-11-11 DIAGNOSIS — R631 Polydipsia: Secondary | ICD-10-CM | POA: Diagnosis not present

## 2015-11-11 DIAGNOSIS — Z87891 Personal history of nicotine dependence: Secondary | ICD-10-CM | POA: Diagnosis not present

## 2015-11-11 LAB — BASIC METABOLIC PANEL
ANION GAP: 11 (ref 5–15)
BUN: 40 mg/dL — ABNORMAL HIGH (ref 6–20)
CALCIUM: 9.7 mg/dL (ref 8.9–10.3)
CO2: 30 mmol/L (ref 22–32)
Chloride: 98 mmol/L — ABNORMAL LOW (ref 101–111)
Creatinine, Ser: 1.53 mg/dL — ABNORMAL HIGH (ref 0.61–1.24)
GFR, EST AFRICAN AMERICAN: 53 mL/min — AB (ref >60)
GFR, EST NON AFRICAN AMERICAN: 45 mL/min — AB (ref >60)
Glucose, Bld: 175 mg/dL — ABNORMAL HIGH (ref 65–99)
POTASSIUM: 3.7 mmol/L (ref 3.5–5.1)
SODIUM: 139 mmol/L (ref 135–145)

## 2015-11-11 MED ORDER — POTASSIUM CHLORIDE CRYS ER 20 MEQ PO TBCR: 100.0000 meq | EXTENDED_RELEASE_TABLET | Freq: Three times a day (TID) | ORAL | Status: DC

## 2015-11-11 NOTE — Progress Notes (Signed)
Advanced Heart Failure Medication Review by a Pharmacist  Does the patient  feel that his/her medications are working for him/her?  yes  Has the patient been experiencing any side effects to the medications prescribed?  no  Does the patient measure his/her own blood pressure or blood glucose at home?  no   Does the patient have any problems obtaining medications due to transportation or finances?   no  Understanding of regimen: good Understanding of indications: good Potential of compliance: good Patient understands to avoid NSAIDs. Patient understands to avoid decongestants.  Issues to address at subsequent visits: Fluid intake!   Pharmacist comments: Weight down 287 4/5>>284 4/19. Denies any issues breathing but gets SOB with minimal exertion, changing clothes, or showering. Still dyspneic with short distances. Sleeps in adjustable bed with head raised.  Continues to regularly drink over 150 oz of fluid daily. Diabetes controlled per pt's wife; A1c 5.5% this month. All morning doses taken prior to visit today. Has no complaints otherwise. All medication questions addressed.   Time with patient: 13min Preparation and documentation time: 81min Total time: 25min  Stephens November, PharmD Clinical Pharmacy Resident 10:07 AM, 11/11/2015

## 2015-11-11 NOTE — Patient Instructions (Signed)
Labs today   A new prescription for potassium has been sent into your pharmacy  Your physician recommends that you schedule a follow-up appointment in: 1 month In the [place] following things EVERYDAY: 1) Weigh yourself in the morning before breakfast. Write it down and keep it in a log. 2) Take your medicines as prescribed 3) Eat low salt foods-Limit salt (sodium) to 2000 mg per day.  4) Stay as active as you can everyday 5) Limit all fluids for the day to less than 2 liters 6)

## 2015-11-12 DIAGNOSIS — M238X1 Other internal derangements of right knee: Secondary | ICD-10-CM | POA: Diagnosis not present

## 2015-11-17 DIAGNOSIS — M25561 Pain in right knee: Secondary | ICD-10-CM | POA: Diagnosis not present

## 2015-12-07 ENCOUNTER — Other Ambulatory Visit: Payer: Self-pay | Admitting: Internal Medicine

## 2015-12-11 DIAGNOSIS — M25561 Pain in right knee: Secondary | ICD-10-CM | POA: Diagnosis not present

## 2015-12-11 DIAGNOSIS — M238X1 Other internal derangements of right knee: Secondary | ICD-10-CM | POA: Diagnosis not present

## 2015-12-14 ENCOUNTER — Other Ambulatory Visit (HOSPITAL_COMMUNITY): Payer: Self-pay | Admitting: Internal Medicine

## 2015-12-15 ENCOUNTER — Encounter (HOSPITAL_COMMUNITY): Payer: Medicare Other

## 2015-12-15 ENCOUNTER — Ambulatory Visit (INDEPENDENT_AMBULATORY_CARE_PROVIDER_SITE_OTHER): Payer: Medicare Other | Admitting: Internal Medicine

## 2015-12-15 ENCOUNTER — Encounter (INDEPENDENT_AMBULATORY_CARE_PROVIDER_SITE_OTHER): Payer: Self-pay | Admitting: Internal Medicine

## 2015-12-15 ENCOUNTER — Encounter (INDEPENDENT_AMBULATORY_CARE_PROVIDER_SITE_OTHER): Payer: Self-pay | Admitting: *Deleted

## 2015-12-15 VITALS — BP 102/78 | HR 67 | Temp 98.0°F | Resp 18 | Ht 68.0 in | Wt 271.2 lb

## 2015-12-15 DIAGNOSIS — K219 Gastro-esophageal reflux disease without esophagitis: Secondary | ICD-10-CM

## 2015-12-15 DIAGNOSIS — K7460 Unspecified cirrhosis of liver: Secondary | ICD-10-CM | POA: Diagnosis not present

## 2015-12-15 DIAGNOSIS — I25812 Atherosclerosis of bypass graft of coronary artery of transplanted heart without angina pectoris: Secondary | ICD-10-CM | POA: Diagnosis not present

## 2015-12-15 DIAGNOSIS — D509 Iron deficiency anemia, unspecified: Secondary | ICD-10-CM | POA: Diagnosis not present

## 2015-12-15 LAB — HEPATIC FUNCTION PANEL
ALBUMIN: 4.3 g/dL (ref 3.6–5.1)
ALK PHOS: 91 U/L (ref 40–115)
ALT: 11 U/L (ref 9–46)
AST: 19 U/L (ref 10–35)
BILIRUBIN INDIRECT: 0.7 mg/dL (ref 0.2–1.2)
Bilirubin, Direct: 0.4 mg/dL — ABNORMAL HIGH (ref <=0.2)
TOTAL PROTEIN: 7.7 g/dL (ref 6.1–8.1)
Total Bilirubin: 1.1 mg/dL (ref 0.2–1.2)

## 2015-12-15 LAB — IRON AND TIBC
%SAT: 24 % (ref 15–60)
IRON: 107 ug/dL (ref 50–180)
TIBC: 447 ug/dL — ABNORMAL HIGH (ref 250–425)
UIBC: 340 ug/dL (ref 125–400)

## 2015-12-15 NOTE — Patient Instructions (Signed)
Hemoccult 1 Patient will call with results of blood tests when completed. Abdominal ultrasound to be scheduled. Will schedule colonoscopy once she recovered from planned knee arthroscopy.

## 2015-12-15 NOTE — Progress Notes (Signed)
Presenting complaint;  Follow-up for cirrhosis iron deficiency anemia and GERD.  Subjective:  Patient is 70 year old Caucasian male who is here for scheduled visit accompanied by his wife. He was last seen in November 2016. His hemoglobin then was 12.5. He  was seen by his endocrinologist last month and he states his hemoglobin was low. It was checked about 6 weeks ago. He states A1c was 5.5. He did try dropping omeprazole dose to once a day at the time of his last visit but he had to go back to twice a day schedule because he began to have breakthrough symptoms at night. He has good appetite and denies nausea vomiting or dysphagia. Bowels move daily or every other day and occasionally he may go 2 days without a bowel movement. He denies abdominal pain melena or rectal bleeding. He says abdominal distention has not increased since his last visit. He does walk some and he goes to [employer] or for grocery shopping. He is having knee pain and is scheduled to have right knee arthroscopy next month. He has not gained any weight since his last visit.   Current Medications: Outpatient Encounter Prescriptions as of 12/15/2015  Medication Sig  . acetaminophen (TYLENOL) 500 MG tablet Take 2,000 mg by mouth at bedtime as needed for mild pain or headache.  . allopurinol (ZYLOPRIM) 100 MG tablet TAKE 1 TABLET (100 MG TOTAL) BY MOUTH DAILY.  [name] atorvastatin (LIPITOR) 20 MG tablet Take 20 mg by mouth daily at 6 PM.   . benzonatate (TESSALON) 200 MG capsule Take 1 capsule (200 mg total) by mouth 3 (three) times daily as needed for cough.  . colchicine 0.6 MG tablet Take 0.6 mg by mouth daily.   . ferrous gluconate (FERGON) 240 (27 FE) MG tablet Take 240 mg by mouth 2 (two) times daily.  [name] gabapentin (NEURONTIN) 300 MG capsule Take 300 mg by mouth 3 (three) times daily.  . insulin NPH Human (HUMULIN N,NOVOLIN N) 100 UNIT/ML injection Inject 70 Units into the skin 2 (two) times daily before a meal.   . insulin regular  (NOVOLIN R,HUMULIN R) 100 units/mL injection Inject 30 Units into the skin 2 (two) times daily before a meal. Per sliding scale  . metolazone (ZAROXOLYN) 2.5 MG tablet Take 1 tablet (2.5 mg total) by mouth 3 (three) times a week. Take every Mon, Wed and Sat  . metoprolol succinate (TOPROL-XL) 100 MG 24 hr tablet Take 1 tablet (100 mg total) by mouth 2 (two) times daily.  . Multiple Vitamins-Minerals (CVS SPECTRAVITE ADULT 50+ PO) Take 1 tablet by mouth daily.  [name] omeprazole (PRILOSEC) 20 MG capsule Take 1 capsule (20 mg total) by mouth 2 (two) times daily before a meal.  . potassium chloride (K-DUR,KLOR-CON) 20 MEQ tablet Take 5 tablets (100 mEq total) by mouth 3 (three) times daily. Take an extra 2 tablets on M,W,Sa with metolazone  . sertraline (ZOLOFT) 50 MG tablet Take 50 mg by mouth daily.  [name] spironolactone (ALDACTONE) 25 MG tablet TAKE 1/2 TABLET BY MOUTH EVERY DAY  . torsemide (DEMADEX) 20 MG tablet TAKE 6 TABLETS IN THE MORNING AND 3 TABLETS IN THE EVENING  . vitamin C (ASCORBIC ACID) 500 MG tablet Take 500 mg by mouth 2 (two) times daily.  . [DISCONTINUED] ipratropium (ATROVENT) 0.06 % nasal spray Place 2 sprays into both nostrils daily. Reported on 12/15/2015   No facility-administered encounter medications on file as of 12/15/2015.     Objective: Blood pressure 102/78, pulse 67, temperature  32 F (36.7 C), temperature source Oral, resp. rate 18, height 5\' 8"  (1.727 m), weight 271 lb 3.2 oz (123.016 kg). Patient is alert and in no acute distress. Asterixis absent. Conjunctiva is pink. Sclera is nonicteric Oropharyngeal mucosa is normal. No neck masses or thyromegaly noted. Cardiac exam with regular rhythm normal S1 and loud S2.  Lungs are clear to auscultation. Grade 2/6 systolic ejection murmur noted at left sternal border. Abdomen is protuberant but soft and nontender with firm left lobe of liver. Shifting dullness is absent. No LE edema or clubbing noted.  Labs/studies  Results: H&H was 12.5 and 37.4 on 06/03/2015  Platelet count was 122K on 06/03/2015.    Assessment:  #1. 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 He has not required abdominal tap in several months. I believe his hepatic function is improved since he had cardiac surgery. He is due for [facility] screening. #2. Chronic GERD. He did not tolerate PPI dose reduction. He will stay on double dose PPI for now. #3. History of iron deficiency anemia. No evidence of overt GI bleed recently. He has undergone extensive evaluation in the past. #4. History of colonic polyps. He is due for surveillance colonoscopy.   Plan:  Hemoccult 1. Patient will go to the lab for CBC, LFTs, AFP, serum iron and ferritin levels. Abdominal ultrasound. Colonoscopy will be scheduled once he has recovered from knee arthroscopy which is planned for next month. Office visit in 6 months.

## 2015-12-16 LAB — CBC
HCT: 41.2 % (ref 38.5–50.0)
Hemoglobin: 12.6 g/dL — ABNORMAL LOW (ref 13.2–17.1)
MCH: 25.5 pg — AB (ref 27.0–33.0)
MCHC: 30.6 g/dL — AB (ref 32.0–36.0)
MCV: 83.4 fL (ref 80.0–100.0)
PLATELETS: 132 10*3/uL — AB (ref 140–400)
RBC: 4.94 MIL/uL (ref 4.20–5.80)
RDW: 18 % — ABNORMAL HIGH (ref 11.0–15.0)
WBC: 6.9 10*3/uL (ref 3.8–10.8)

## 2015-12-16 LAB — AFP TUMOR MARKER: AFP-Tumor Marker: 2.1 ng/mL (ref <6.1)

## 2015-12-16 LAB — FERRITIN: Ferritin: 54 ng/mL (ref 20–380)

## 2015-12-17 ENCOUNTER — Ambulatory Visit (HOSPITAL_COMMUNITY)
Admission: RE | Admit: 2015-12-17 | Discharge: 2015-12-17 | Disposition: A | Discharge status: Home / Self Care | Payer: Medicare Other | Source: Ambulatory Visit | Attending: Adult Health | Admitting: Adult Health

## 2015-12-17 VITALS — BP 90/72 | HR 96 | Wt 273.4 lb

## 2015-12-17 DIAGNOSIS — Z87891 Personal history of nicotine dependence: Secondary | ICD-10-CM | POA: Insufficient documentation

## 2015-12-17 DIAGNOSIS — I2510 Atherosclerotic heart disease of native coronary artery without angina pectoris: Secondary | ICD-10-CM | POA: Insufficient documentation

## 2015-12-17 DIAGNOSIS — G4733 Obstructive sleep apnea (adult) (pediatric): Secondary | ICD-10-CM | POA: Insufficient documentation

## 2015-12-17 DIAGNOSIS — Z794 Long term (current) use of insulin: Secondary | ICD-10-CM | POA: Insufficient documentation

## 2015-12-17 DIAGNOSIS — E785 Hyperlipidemia, unspecified: Secondary | ICD-10-CM | POA: Diagnosis not present

## 2015-12-17 DIAGNOSIS — K219 Gastro-esophageal reflux disease without esophagitis: Secondary | ICD-10-CM | POA: Insufficient documentation

## 2015-12-17 DIAGNOSIS — E1122 Type 2 diabetes mellitus with diabetic chronic kidney disease: Secondary | ICD-10-CM | POA: Diagnosis not present

## 2015-12-17 DIAGNOSIS — M109 Gout, unspecified: Secondary | ICD-10-CM | POA: Insufficient documentation

## 2015-12-17 DIAGNOSIS — Z951 Presence of aortocoronary bypass graft: Secondary | ICD-10-CM | POA: Diagnosis not present

## 2015-12-17 DIAGNOSIS — Z79899 Other long term (current) drug therapy: Secondary | ICD-10-CM | POA: Insufficient documentation

## 2015-12-17 DIAGNOSIS — N183 Chronic kidney disease, stage 3 (moderate): Secondary | ICD-10-CM | POA: Diagnosis not present

## 2015-12-17 DIAGNOSIS — K7581 Nonalcoholic steatohepatitis (NASH): Secondary | ICD-10-CM | POA: Diagnosis not present

## 2015-12-17 DIAGNOSIS — I5032 Chronic diastolic (congestive) heart failure: Secondary | ICD-10-CM | POA: Insufficient documentation

## 2015-12-17 DIAGNOSIS — I10 Essential (primary) hypertension: Secondary | ICD-10-CM

## 2015-12-17 DIAGNOSIS — I130 Hypertensive heart and chronic kidney disease with heart failure and stage 1 through stage 4 chronic kidney disease, or unspecified chronic kidney disease: Secondary | ICD-10-CM | POA: Diagnosis not present

## 2015-12-17 DIAGNOSIS — I4821 Permanent atrial fibrillation: Secondary | ICD-10-CM

## 2015-12-17 DIAGNOSIS — Z8249 Family history of ischemic heart disease and other diseases of the circulatory system: Secondary | ICD-10-CM | POA: Insufficient documentation

## 2015-12-17 DIAGNOSIS — Z953 Presence of xenogenic heart valve: Secondary | ICD-10-CM | POA: Insufficient documentation

## 2015-12-17 DIAGNOSIS — J449 Chronic obstructive pulmonary disease, unspecified: Secondary | ICD-10-CM | POA: Insufficient documentation

## 2015-12-17 DIAGNOSIS — I482 Chronic atrial fibrillation: Secondary | ICD-10-CM | POA: Diagnosis not present

## 2015-12-17 NOTE — Progress Notes (Signed)
Patient ID: [name], male   DOB: August 28, 1945, 70 y.o.   MRN: [id number]    Advanced Heart Failure Clinic Note   GI: Dr Laural Golden- followed for cirrhosis Pulmonary: Dr Melvyn Novas  PCP: Marjean Donna [facility])  Cardiology: Dr. Aundra Dubin  Mr [name] is a 70 yo with history of CAD s/p CABG, permanent atrial fibrillation, chronic diastolic CHF, severe AS s/p bioprosthetic AVR who presents for evaluation of diastolic CHF.  Last LHC in 8/13 showed patent grafts.  AVR was in 10/13.  Last echo in 8/16 showed EF 55-60%, normal bioprosthetic aortic valve.   July 2015:  Admitted to the hospital for GI bleed requiring 2 PRBCs. No additional GI work-up with recent EGD showing gastritis.  He had an AV nodal ablation in 9/15 with significant improvement after this.  He has St Jude PPM.   He returns for HF follow up. Overall feeling ok.  Mild dyspnea with exertion. Denies PND/Orthoppnea.Limited mobility due to knee pain.  Weight at home 269-270 pounds. Taking all medications. Drinking > 2 Iters per day.    Labs (8/14) LDL 38 Labs (3/15) K 3.4, creatinine 1.7, BUN 55  Labs (10/15/13) K 4.0 Creatinine 1.6 BUN 32 Labs (10/29/13) K 4.0 Creatinine 1.6  Labs (4/15) K 4.2 Creatinine 1.32 => 1.5, HCT 35.2 Labs (12/11/13) K 3.6 Creatinine 1.56 Pro BNP 1152 Hemoglobin 9.6  Labs (01/12/14) K 3.0, creatinine 2.0, hemoglobin 8.8 Labs (01/31/14) K 3.3, creatinine 1.49 Labs (8/15) K 3.9, creatinine 1.89 Labs (9/15): K 3.7, creatinine 1.6, AST 48, ALT 52, TSH normal Labs (10/15): K 3.5, creatinine 1.58, BNP 780 Labs (3/16): K 3.5, creatinine 1.6 Labs 12/03/2014: K 4.4 Creatinine 1.55 Labs 12/12/2014: K 3.7 Creatinine 1.48, LDL 50 Labs 8/16: K 3.8, creatinine 2.42 Labs 04/07/2015: K 4.3, Creatinine 2.22, BNP 169 Labs (11/16): K 4, creatinine 1.78 => 1.55, BNP 120 Labs 11/11/2015 K 3.7 Creatinine 1.53   PMH: 1. CAD: s/p CABG 2004. Last LHC (8/13) with patent free radial-OM, patent SVG-D, patent LIMA-LAD.  2. Permanent  atrial fibrillation: Not anticoagulated due to GI bleeding.  Holter monitor (4/15) with mean HR 115 (afib).   He had AV nodal ablation with St Jude dual chamber PPM  3. Chronic diastolic CHF: Echo (123456) with EF 55-60%, bioprosthetic aortic valve with mean gradient 22 mmHg, mildly decreased RV systolic function. RHC (12/2013): RA 14, RV 44/4/11, PA 50/23 (33), PCWP 20, v = 35, Fick CO/CI: 6.0/2.6, PVR 2.2 WU, PA 59%.  Echo (8/15) with EF 55-60%, mild LVH, bioprosthetic aortic valve with mean gradient 16 mmHg, PA systolic pressure 46 mmHg, mild to moderate MR.  Echo (8/16) with EF 55-60%, normal bioprosthetic aortic valve, mild MR, PASP 44 mmHg.  4. Severe aortic stenosis s/p bioprosthetic AVR in 10/13. Mean gradient 22 mmHg across valve on echo in 8/14.  Mean gradient 16 mmHg on echo in 8/15.  5. NASH: ascites, thrombocytopenia.  Had paracentesis in 1/15. Gynecomastia with spironolactone.  6. 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  EGD in 1/15 showed mild portal gastropathy and 2 small antral ulcers. Recurrent GI bleed in 6/15, EGD showed gastritis and ?GAVE.  7. Hyperlipidemia 8. HTN 9. GERD 10. COPD 42. OSA: Cannot tolerate CPAP.  12. CKD 13. Gout 14. Carotid dopplers (11/15) with minimal stenosis.  15. ABI 12/11/2014: normal    SH: Lives with wife in [place], prior smoker.   FH: CAD  ROS: All systems reviewed and negative except as per HPI.   Current Outpatient  Prescriptions on File Prior to Encounter  Medication Sig Dispense Refill  . acetaminophen (TYLENOL) 500 MG tablet Take 2,000 mg by mouth at bedtime as needed for mild pain or headache.    . allopurinol (ZYLOPRIM) 100 MG tablet TAKE 1 TABLET (100 MG TOTAL) BY MOUTH DAILY. 30 tablet 6  . atorvastatin (LIPITOR) 20 MG tablet Take 20 mg by mouth daily at 6 PM.     . benzonatate (TESSALON) 200 MG capsule Take 1 capsule (200 mg total) by mouth 3 (three) times daily as needed for cough. 40 capsule 1  . colchicine 0.6 MG tablet Take 0.6 mg  by mouth daily.     . ferrous gluconate (FERGON) 240 (27 FE) MG tablet Take 240 mg by mouth 2 (two) times daily.    [name] gabapentin (NEURONTIN) 300 MG capsule Take 300 mg by mouth 3 (three) times daily.  6  . insulin NPH Human (HUMULIN N,NOVOLIN N) 100 UNIT/ML injection Inject 70 Units into the skin 2 (two) times daily before a meal.     . insulin regular (NOVOLIN R,HUMULIN R) 100 units/mL injection Inject 30 Units into the skin 2 (two) times daily before a meal. Per sliding scale    . metolazone (ZAROXOLYN) 2.5 MG tablet Take 1 tablet (2.5 mg total) by mouth 3 (three) times a week. Take every Mon, Wed and Sat 15 tablet 6  . metoprolol succinate (TOPROL-XL) 100 MG 24 hr tablet Take 1 tablet (100 mg total) by mouth 2 (two) times daily. 60 tablet 6  . Multiple Vitamins-Minerals (CVS SPECTRAVITE ADULT 50+ PO) Take 1 tablet by mouth daily.    [name] omeprazole (PRILOSEC) 20 MG capsule Take 1 capsule (20 mg total) by mouth 2 (two) times daily before a meal. 30 capsule 5  . potassium chloride (K-DUR,KLOR-CON) 20 MEQ tablet Take 5 tablets (100 mEq total) by mouth 3 (three) times daily. Take an extra 2 tablets on M,W,Sa with metolazone 500 tablet 6  . sertraline (ZOLOFT) 50 MG tablet Take 50 mg by mouth daily.    [name] spironolactone (ALDACTONE) 25 MG tablet TAKE 1/2 TABLET BY MOUTH EVERY DAY 15 tablet 5  . torsemide (DEMADEX) 20 MG tablet TAKE 6 TABLETS IN THE MORNING AND 3 TABLETS IN THE EVENING 270 tablet 3  . vitamin C (ASCORBIC ACID) 500 MG tablet Take 500 mg by mouth 2 (two) times daily.     No current facility-administered medications on file prior to encounter.    Filed Vitals:   12/17/15 1016  BP: 90/72  Pulse: 96  Weight: 273 lb 6.4 oz (124.013 kg)  SpO2: 93%   Wt Readings from Last 3 Encounters:  12/17/15 273 lb 6.4 oz (124.013 kg)  12/15/15 271 lb 3.2 oz (123.016 kg)  11/11/15 284 lb 3.2 oz (128.912 kg)     General: NAD. Ambulated in the clinic without difficulty.  Neck: JVP 7-8 cm, no  thyromegaly or thyroid nodule.  Lungs: CTAB, normal effort CV: Nondisplaced PMI.  Irregular S1/S2, no S3/S4, 2/6 early SEM RUSB.  No edema.  Left carotid bruit.  1+ PT pulses bilaterally.  Abdomen: Soft, obese nontender, no hepatosplenomegaly.  Moderately distended Skin: Intact without lesions or rashes.  Neurologic: Alert and oriented x 3.  Psych: Normal affect. Extremities: No clubbing or cyanosis. Left ankle is warm and tender.   Assessment/Plan: 1. Chronic diastolic CHF:  EF 0000000 (02/2015).  NYHA class III symptoms.    - Volume status stable. Continue torsemide 120 qam/60 qpm.  - Continue  K 100 meq TID with extra 40 meq on metolazone days.  - Continue metolazone Monday/Wednesday/Saturday.  Reinforced limiting fluid intake to < 2 liters per day.  2. Chronic atrial fibrillation:  CHADSVASC 4 - age > 103, CHF, HTN, DM2. 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 We tried ASA, however it was stopped again due to GIB. HR was very difficult to control, concerned that elevated HR contributed to CHF and exercise intolerance.  Therefore, he had AV nodal ablation with dual chamber pacing.   3. CKD stage III: BMET today.   4. Bioprosthetic AVR: 8/16 echo showed stable bioprosthetic aortic valve.   5. CAD: s/p CABG. No chest pain.  - Continue atorvastatin 20 mg daily and Toprol XL 100 mg BID. He has been off aspirin and anticoagulation with history of GI bleeding.  6. Cirrhosis: NASH-related. Sees Dr. Laural Golden.   7. Gout:  - Improved    8. Leg pain:  Had ABI 5/16--> Normal.   9. DM2  - Recent Hgb A1C 5.5. Per PCP.  10. HTN - Stable on current meds 112. HLD Continue atorvastatin 20 mg daily    Follow up in 2 months with Dr Precious Bard NP-C  12/17/2015

## 2015-12-17 NOTE — Progress Notes (Signed)
Advanced Heart Failure Medication Review by a Pharmacist  Does the patient  feel that his/her medications are working for him/her?  yes  Has the patient been experiencing any side effects to the medications prescribed?  no  Does the patient measure his/her own blood pressure or blood glucose at home?  yes   Does the patient have any problems obtaining medications due to transportation or finances?   no  Understanding of regimen: good Understanding of indications: good Potential of compliance: good Patient understands to avoid NSAIDs. Patient understands to avoid decongestants.  Issues to address at subsequent visits: None   Pharmacist comments:  [name] is a pleasant 70 yo M presenting with his wife and without a medication list. He reports good compliance with his regimen and did not have any specific medication-related questions or concerns for me at this time.   [name], PharmD, BCPS, CPP Clinical Pharmacist Pager: [phone] Phone: [phone] 12/17/2015 10:36 AM     Time with patient: 8 minutes Preparation and documentation time: 2 minutes Total time: 10 minutes

## 2015-12-17 NOTE — Patient Instructions (Signed)
Follow up in 2 months with Dr.McLean 

## 2015-12-22 ENCOUNTER — Ambulatory Visit (HOSPITAL_COMMUNITY)
Admission: RE | Admit: 2015-12-22 | Discharge: 2015-12-22 | Disposition: A | Discharge status: Home / Self Care | Payer: Medicare Other | Source: Ambulatory Visit | Attending: Internal Medicine | Admitting: Internal Medicine

## 2015-12-22 DIAGNOSIS — R188 Other ascites: Secondary | ICD-10-CM | POA: Insufficient documentation

## 2015-12-22 DIAGNOSIS — K8020 Calculus of gallbladder without cholecystitis without obstruction: Secondary | ICD-10-CM | POA: Insufficient documentation

## 2015-12-22 DIAGNOSIS — K7460 Unspecified cirrhosis of liver: Secondary | ICD-10-CM | POA: Diagnosis not present

## 2015-12-22 DIAGNOSIS — R161 Splenomegaly, not elsewhere classified: Secondary | ICD-10-CM | POA: Diagnosis not present

## 2015-12-23 ENCOUNTER — Telehealth (INDEPENDENT_AMBULATORY_CARE_PROVIDER_SITE_OTHER): Payer: Self-pay | Admitting: *Deleted

## 2015-12-23 NOTE — Telephone Encounter (Signed)
   Diagnosis:    Result(s)   Card 1: Negative:          Completed by: Thomas Hoff ,LPN   HEMOCCULT SENSA DEVELOPER: LOT#:  [id number] EXPIRATION DATE: 9-17   HEMOCCULT SENSA CARD:  LOT#:  02/14 EXPIRATION DATE: 07/18  CARD CONTROL RESULTS:  POSITIVE:Positive  NEGATIVE: Negative    ADDITIONAL COMMENTS: Patient was called and given result. Forwarded to [place] for review.

## 2015-12-24 HISTORY — PX: PARACENTESIS: SHX844

## 2015-12-24 NOTE — Telephone Encounter (Signed)
Stool guaiac negative 

## 2015-12-26 ENCOUNTER — Ambulatory Visit: Payer: Self-pay | Admitting: Orthopedic Surgery

## 2016-01-05 ENCOUNTER — Other Ambulatory Visit (HOSPITAL_COMMUNITY): Payer: Self-pay | Admitting: Internal Medicine

## 2016-01-08 ENCOUNTER — Other Ambulatory Visit (HOSPITAL_COMMUNITY): Payer: Self-pay | Admitting: Physician Assistant

## 2016-01-08 DIAGNOSIS — R590 Localized enlarged lymph nodes: Secondary | ICD-10-CM | POA: Diagnosis not present

## 2016-01-08 DIAGNOSIS — R1900 Intra-abdominal and pelvic swelling, mass and lump, unspecified site: Secondary | ICD-10-CM

## 2016-01-11 ENCOUNTER — Ambulatory Visit (INDEPENDENT_AMBULATORY_CARE_PROVIDER_SITE_OTHER): Payer: Medicare Other | Admitting: *Deleted

## 2016-01-11 DIAGNOSIS — I442 Atrioventricular block, complete: Secondary | ICD-10-CM

## 2016-01-11 NOTE — Progress Notes (Signed)
Remote pacemaker transmission.   

## 2016-01-12 LAB — CUP PACEART REMOTE DEVICE CHECK
Date Time Interrogation Session: 20170619072642
Implantable Lead Implant Date: 20150831
Lead Channel Impedance Value: 690 Ohm
Lead Channel Pacing Threshold Amplitude: 0.625 V
Lead Channel Pacing Threshold Pulse Width: 0.4 ms
Lead Channel Setting Sensing Sensitivity: 6 mV
MDC IDC LEAD LOCATION: 753860
MDC IDC LEAD MODEL: 1948
MDC IDC MSMT BATTERY REMAINING LONGEVITY: 139 mo
MDC IDC MSMT BATTERY REMAINING PERCENTAGE: 95.5 %
MDC IDC MSMT BATTERY VOLTAGE: 3.01 V
MDC IDC MSMT LEADCHNL RV SENSING INTR AMPL: 12 mV
MDC IDC SET LEADCHNL RV PACING AMPLITUDE: 0.875
MDC IDC SET LEADCHNL RV PACING PULSEWIDTH: 0.4 ms
MDC IDC STAT BRADY RV PERCENT PACED: 99 %
Pulse Gen Model: 1240
Pulse Gen Serial Number: 3014543

## 2016-01-13 ENCOUNTER — Ambulatory Visit (HOSPITAL_COMMUNITY)
Admission: RE | Admit: 2016-01-13 | Discharge: 2016-01-13 | Disposition: A | Discharge status: Home / Self Care | Payer: Medicare Other | Source: Ambulatory Visit | Attending: Physician Assistant | Admitting: Physician Assistant

## 2016-01-13 DIAGNOSIS — R932 Abnormal findings on diagnostic imaging of liver and biliary tract: Secondary | ICD-10-CM | POA: Insufficient documentation

## 2016-01-13 DIAGNOSIS — R590 Localized enlarged lymph nodes: Secondary | ICD-10-CM | POA: Insufficient documentation

## 2016-01-13 DIAGNOSIS — R161 Splenomegaly, not elsewhere classified: Secondary | ICD-10-CM | POA: Insufficient documentation

## 2016-01-13 DIAGNOSIS — R188 Other ascites: Secondary | ICD-10-CM | POA: Insufficient documentation

## 2016-01-13 DIAGNOSIS — R1900 Intra-abdominal and pelvic swelling, mass and lump, unspecified site: Secondary | ICD-10-CM | POA: Diagnosis not present

## 2016-01-13 DIAGNOSIS — K449 Diaphragmatic hernia without obstruction or gangrene: Secondary | ICD-10-CM | POA: Diagnosis not present

## 2016-01-13 NOTE — Progress Notes (Signed)
01/11/16- in EPIC- remote pacemaker transmission 10/12/15-in EPIC- LOV Dr. Caryl Comes

## 2016-01-13 NOTE — Patient Instructions (Addendum)
[name]  01/13/2016   Your procedure is scheduled on: Wednesday 01/20/16  Report to [facility] Main  Entrance take [facility]  elevators to 3rd floor to  [place] at  0600 AM.  Call this number if you have problems the morning of surgery [phone]   Remember: ONLY 1 PERSON MAY GO WITH YOU TO SHORT STAY TO GET  READY MORNING OF [name]   Do not eat food or drink liquids :After Midnight.              TAKE 1/2 DOSE OF HUMULIN N INSULIN NIGHT BEFORE SURGERY!      Take these medicines the morning of surgery with A SIP OF WATER: Metoprolol, Omeprazole, Gabapentin, Sertraline              DO NOT TAKE ANY DIABETIC MEDICATIONS DAY OF YOUR SURGERY!                               You may not have any metal on your body including hair pins and              piercings  Do not wear jewelry, make-up, lotions, powders or perfumes, deodorant             Do not wear nail polish.  Do not shave  48 hours prior to surgery.              Men may shave face and neck.   Do not bring valuables to the hospital. [name]  Contacts, dentures or bridgework may not be worn into surgery.  Leave suitcase in the car. After surgery it may be brought to your room.     Patients discharged the day of surgery will not be allowed to drive home.  Name and phone number of your driver:spouse- [name]  Special Instructions: N/A              Please read over the following fact sheets you were given: _____________________________________________________________________             [facility] - Preparing for Surgery Before surgery, you can play an important role.  Because skin is not sterile, your skin needs to be as free of germs as possible.  You can reduce the number of germs on your skin by washing with CHG (chlorahexidine gluconate) soap before surgery.  CHG is an antiseptic cleaner which kills germs and bonds with the skin  to continue killing germs even after washing. Please DO NOT use if you have an allergy to CHG or antibacterial soaps.  If your skin becomes reddened/irritated stop using the CHG and inform your nurse when you arrive at Short Stay. Do not shave (including legs and underarms) for at least 48 hours prior to the first CHG shower.  You may shave your face/neck. Please follow these instructions carefully:  1.  Shower with CHG Soap the night before surgery and the  morning of Surgery.  2.  If you choose to wash your hair, wash your hair first as usual with your  normal  shampoo.  3.  After you shampoo, rinse your hair and body thoroughly to remove the  shampoo.  4.  Use CHG as you would any other liquid soap.  You can apply chg directly  to the skin and wash                       Gently with a scrungie or clean washcloth.  5.  Apply the CHG Soap to your body ONLY FROM THE NECK DOWN.   Do not use on face/ open                           Wound or open sores. Avoid contact with eyes, ears mouth and genitals (private parts).                       Wash face,  Genitals (private parts) with your normal soap.             6.  Wash thoroughly, paying special attention to the area where your surgery  will be performed.  7.  Thoroughly rinse your body with warm water from the neck down.  8.  DO NOT shower/wash with your normal soap after using and rinsing off  the CHG Soap.                9.  Pat yourself dry with a clean towel.            10.  Wear clean pajamas.            11.  Place clean sheets on your bed the night of your first shower and do not  sleep with pets. Day of Surgery : Do not apply any lotions/deodorants the morning of surgery.  Please wear clean clothes to the hospital/surgery center.  FAILURE TO FOLLOW THESE INSTRUCTIONS MAY RESULT IN THE CANCELLATION OF YOUR SURGERY PATIENT SIGNATURE_________________________________  NURSE  SIGNATURE__________________________________  ________________________________________________________________________   [name]  An incentive spirometer is a tool that can help keep your lungs clear and active. This tool measures how well you are filling your lungs with each breath. Taking long deep breaths may help reverse or decrease the chance of developing breathing (pulmonary) problems (especially infection) following:  A long period of time when you are unable to move or be active. BEFORE THE PROCEDURE   If the spirometer includes an indicator to show your best effort, your nurse or respiratory therapist will set it to a desired goal.  If possible, sit up straight or lean slightly forward. Try not to slouch.  Hold the incentive spirometer in an upright position. INSTRUCTIONS FOR USE  1. Sit on the edge of your bed if possible, or sit up as far as you can in bed or on a chair. 2. Hold the incentive spirometer in an upright position. 3. Breathe out normally. 4. Place the mouthpiece in your mouth and seal your lips tightly around it. 5. Breathe in slowly and as deeply as possible, raising the piston or the ball toward the top of the column. 6. Hold your breath for 3-5 seconds or for as long as possible. Allow the piston or ball to fall to the bottom of the column. 7. Remove the mouthpiece from your mouth and breathe out normally. 8. Rest for a few seconds and repeat Steps 1 through 7 at least 10 times every 1-2 hours when you are awake. Take your time and take a few normal breaths between deep breaths. 9. The spirometer may include an indicator to show  your best effort. Use the indicator as a goal to work toward during each repetition. 10. After each set of 10 deep breaths, practice coughing to be sure your lungs are clear. If you have an incision (the cut made at the time of surgery), support your incision when coughing by placing a pillow or rolled up towels firmly  against it. Once you are able to get out of bed, walk around indoors and cough well. You may stop using the incentive spirometer when instructed by your caregiver.  RISKS AND COMPLICATIONS  Take your time so you do not get dizzy or light-headed.  If you are in pain, you may need to take or ask for pain medication before doing incentive spirometry. It is harder to take a deep breath if you are having pain. AFTER USE  Rest and breathe slowly and easily.  It can be helpful to keep track of a log of your progress. Your caregiver can provide you with a simple table to help with this. If you are using the spirometer at home, follow these instructions: [place] IF:   You are having difficultly using the spirometer.  You have trouble using the spirometer as often as instructed.  Your pain medication is not giving enough relief while using the spirometer.  You develop fever of 100.5 F (38.1 C) or higher. SEEK IMMEDIATE MEDICAL CARE IF:   You cough up bloody sputum that had not been present before.  You develop fever of 102 F (38.9 C) or greater.  You develop worsening pain at or near the incision site. MAKE SURE YOU:   Understand these instructions.  Will watch your condition.  Will get help right away if you are not doing well or get worse. Document Released: 11/21/2006 Document Revised: 10/03/2011 Document Reviewed: 01/22/2007 [facility] Patient Information 2014 [place], [place].   ________________________________________________________________________

## 2016-01-14 ENCOUNTER — Encounter (HOSPITAL_COMMUNITY): Payer: Self-pay

## 2016-01-14 ENCOUNTER — Encounter (HOSPITAL_COMMUNITY)
Admission: RE | Admit: 2016-01-14 | Discharge: 2016-01-14 | Disposition: A | Discharge status: Home / Self Care | Payer: Medicare Other | Source: Ambulatory Visit | Attending: Orthopedic Surgery | Admitting: Orthopedic Surgery

## 2016-01-14 DIAGNOSIS — E119 Type 2 diabetes mellitus without complications: Secondary | ICD-10-CM | POA: Insufficient documentation

## 2016-01-14 DIAGNOSIS — S83241A Other tear of medial meniscus, current injury, right knee, initial encounter: Secondary | ICD-10-CM | POA: Insufficient documentation

## 2016-01-14 DIAGNOSIS — Z01818 Encounter for other preprocedural examination: Secondary | ICD-10-CM | POA: Insufficient documentation

## 2016-01-14 DIAGNOSIS — Z01812 Encounter for preprocedural laboratory examination: Secondary | ICD-10-CM | POA: Insufficient documentation

## 2016-01-14 DIAGNOSIS — X58XXXA Exposure to other specified factors, initial encounter: Secondary | ICD-10-CM | POA: Insufficient documentation

## 2016-01-14 DIAGNOSIS — R9431 Abnormal electrocardiogram [ECG] [EKG]: Secondary | ICD-10-CM | POA: Diagnosis not present

## 2016-01-14 DIAGNOSIS — I10 Essential (primary) hypertension: Secondary | ICD-10-CM | POA: Diagnosis not present

## 2016-01-14 LAB — SURGICAL PCR SCREEN
MRSA, PCR: NEGATIVE
STAPHYLOCOCCUS AUREUS: NEGATIVE

## 2016-01-14 LAB — CBC
HCT: 36.5 % — ABNORMAL LOW (ref 39.0–52.0)
HEMOGLOBIN: 11.2 g/dL — AB (ref 13.0–17.0)
MCH: 25.3 pg — AB (ref 26.0–34.0)
MCHC: 30.7 g/dL (ref 30.0–36.0)
MCV: 82.4 fL (ref 78.0–100.0)
PLATELETS: 180 10*3/uL (ref 150–400)
RBC: 4.43 MIL/uL (ref 4.22–5.81)
RDW: 18.5 % — AB (ref 11.5–15.5)
WBC: 7.4 10*3/uL (ref 4.0–10.5)

## 2016-01-14 LAB — COMPREHENSIVE METABOLIC PANEL
ALBUMIN: 3.8 g/dL (ref 3.5–5.0)
ALK PHOS: 91 U/L (ref 38–126)
ALT: 12 U/L — AB (ref 17–63)
ANION GAP: 10 (ref 5–15)
AST: 25 U/L (ref 15–41)
BILIRUBIN TOTAL: 1.3 mg/dL — AB (ref 0.3–1.2)
BUN: 34 mg/dL — AB (ref 6–20)
CALCIUM: 9.5 mg/dL (ref 8.9–10.3)
CO2: 31 mmol/L (ref 22–32)
CREATININE: 1.52 mg/dL — AB (ref 0.61–1.24)
Chloride: 98 mmol/L — ABNORMAL LOW (ref 101–111)
GFR calc Af Amer: 53 mL/min — ABNORMAL LOW (ref >60)
GFR calc non Af Amer: 46 mL/min — ABNORMAL LOW (ref >60)
GLUCOSE: 114 mg/dL — AB (ref 65–99)
Potassium: 3.8 mmol/L (ref 3.5–5.1)
Sodium: 139 mmol/L (ref 135–145)
TOTAL PROTEIN: 8.1 g/dL (ref 6.5–8.1)

## 2016-01-15 ENCOUNTER — Encounter: Payer: Self-pay | Admitting: Cardiology

## 2016-01-15 ENCOUNTER — Other Ambulatory Visit (INDEPENDENT_AMBULATORY_CARE_PROVIDER_SITE_OTHER): Payer: Self-pay | Admitting: Internal Medicine

## 2016-01-15 DIAGNOSIS — R188 Other ascites: Secondary | ICD-10-CM

## 2016-01-15 LAB — HEMOGLOBIN A1C
HEMOGLOBIN A1C: 6.7 % — AB (ref 4.8–5.6)
MEAN PLASMA GLUCOSE: 146 mg/dL

## 2016-01-18 ENCOUNTER — Encounter (HOSPITAL_COMMUNITY): Payer: Self-pay

## 2016-01-18 ENCOUNTER — Ambulatory Visit (HOSPITAL_COMMUNITY)
Admission: RE | Admit: 2016-01-18 | Discharge: 2016-01-18 | Disposition: A | Discharge status: Home / Self Care | Payer: Medicare Other | Source: Ambulatory Visit | Attending: Internal Medicine | Admitting: Internal Medicine

## 2016-01-18 DIAGNOSIS — R188 Other ascites: Secondary | ICD-10-CM | POA: Diagnosis present

## 2016-01-18 DIAGNOSIS — K7460 Unspecified cirrhosis of liver: Secondary | ICD-10-CM | POA: Diagnosis not present

## 2016-01-18 LAB — BODY FLUID CELL COUNT WITH DIFFERENTIAL
EOS FL: 0 %
LYMPHS FL: 38 %
Monocyte-Macrophage-Serous Fluid: 39 % — ABNORMAL LOW (ref 50–90)
NEUTROPHIL FLUID: 23 % (ref 0–25)
WBC FLUID: 1584 uL — AB (ref 0–1000)

## 2016-01-18 LAB — GRAM STAIN

## 2016-01-18 MED ORDER — ALBUMIN HUMAN 25 % IV SOLN
50.0000 g | Freq: Once | INTRAVENOUS | Status: AC
  Administered 2016-01-18: 50 g via INTRAVENOUS

## 2016-01-18 MED ORDER — ALBUMIN HUMAN 25 % IV SOLN
INTRAVENOUS | Status: AC
  Administered 2016-01-18: 50 g via INTRAVENOUS
  Filled 2016-01-18: qty 200

## 2016-01-18 NOTE — Progress Notes (Signed)
Paracentesis complete no signs of distress. 7600 ml amber colored ascites removed.

## 2016-01-18 NOTE — Procedures (Signed)
PreOperative Dx: Cirrhosis, ascites Postoperative Dx: Cirrhosis, ascites Procedure:   US guided paracentesis Radiologist:  Thornton Papas Anesthesia:  10 ml of1% lidocaine Specimen:  7.6 L of yellow ascitic fluid EBL:   < 1 ml Complications: None

## 2016-01-19 DIAGNOSIS — S83249A Other tear of medial meniscus, current injury, unspecified knee, initial encounter: Secondary | ICD-10-CM | POA: Diagnosis present

## 2016-01-19 LAB — PATHOLOGIST SMEAR REVIEW

## 2016-01-19 MED ORDER — DEXTROSE 5 % IV SOLN
3.0000 g | INTRAVENOUS | Status: AC
  Administered 2016-01-20: 3 g via INTRAVENOUS
  Filled 2016-01-19: qty 3

## 2016-01-19 NOTE — H&P (Signed)
CC- [name] is a 70 y.o. male who presents with right knee pain.  HPI- . Knee Pain: Patient presents with knee pain involving the  right knee. Onset of the symptoms was several months ago. Inciting event: none known. Current symptoms include giving out, pain located medially, stiffness and swelling. Pain is aggravated by lateral movements, pivoting, rising after sitting, squatting, standing and walking.  Patient has had no prior knee problems. Evaluation to date: plain films: abnormal mild medial joint space narrowing. Treatment to date: corticosteroid injection which was not very effective. He could not have an MRI because of a pacemaker  Past Medical History  Diagnosis Date  . Overweight(278.02)   . CAD (coronary artery disease)     a. s/p CABG 2004;  b. [place] 10/13:  LHC 8/13: Free radial to obtuse marginal patent, SVG-diagonal patent, LIMA-LAD patent, EF 65-70%, mean aortic valve gradient 42  . Atrial fibrillation ([place])     Permanent; off of Coumadin for now due to GI bleed  . DM2 (diabetes mellitus, type 2) (HCC)     at least 10 yrs  . Insomnia   . Carotid bruit 06/14/2011    a. pre-AVR dopplers 10/13: no sig ICA stenosis  . Hypertension     x 15 yrs  . Chronic diastolic heart failure ([place])   . GERD (gastroesophageal reflux disease)   . COPD (chronic obstructive pulmonary disease) ([place])   . Iron deficiency anemia     Requiring intravenous iron  . H/O hiatal hernia   . AVM (arteriovenous malformation)     Recurrent GI bleeding requiring multiple transfusions  . Hyperlipidemia   . Thrombocytopenia ([place])   . Ascites     status post paracentesis with removal of 3.4 L of ascitic fluid  . Osteoarthritis   . Cirrhosis ([place])   . Mediastinal adenopathy 09/22/2011  . Aortic stenosis 03/08/2012    a.  s/p tissue AVR 10/13 with Dr. [name];   b. Echo 10/13: mod LVH, EF 55-60%, tissue AVR not well seen, no leak, gradient not too high (mean 73mmHg), MAC, mild MR, mild LAE, PASP 38   . OSA (obstructive sleep apnea) 1999     USES CPAP    Past Surgical History  Procedure Laterality Date  . Coronary artery bypass graft   10/15/2002     [name]. [name], M.D.     . Carpal tunnel release    10/08/2003  . Lipoma surgery    . Hernia repair    . Other surgical history  08/26/2011    [facility],  enteroscopy , revealing "three-four AVMs."   . Tee without cardioversion  03/07/2012    Procedure: TRANSESOPHAGEAL ECHOCARDIOGRAM (TEE);  Surgeon: Thayer Headings, MD;  Location: [facility] ENDOSCOPY;  Service: Cardiovascular;  Laterality: N/A;  . Coronary angioplasty with stent placement  01/19/2005    drug eluting stent to high grade ostial stenosis of radial artery graft to OM  . Aortic valve replacement  04/25/2012    Procedure: AORTIC VALVE REPLACEMENT (AVR);  Surgeon: Melrose Nakayama, MD;  Location: [place];  Service: Open Heart Surgery;  Laterality: N/A;  . Esophagogastroduodenoscopy N/A 07/31/2013    Procedure: ESOPHAGOGASTRODUODENOSCOPY (EGD);  Surgeon: Milus Banister, MD;  Location: [place];  Service: Endoscopy;  Laterality: N/A;  . Esophagogastroduodenoscopy Left 01/11/2014    Procedure: ESOPHAGOGASTRODUODENOSCOPY (EGD);  Surgeon: Juanita Craver, MD;  Location: [facility] ENDOSCOPY;  Service: Endoscopy;  Laterality: Left;  . Pacemaker insertion  03-24-14    [employer]J [employer]  single chamber pacemaker implanted by Dr Caryl Comes  . Ablation  04-18-14    AVN ablation by Dr Caryl Comes  . Left and right heart catheterization with coronary/graft angiogram N/A 03/07/2012    Procedure: LEFT AND RIGHT HEART CATHETERIZATION WITH Beatrix Fetters;  Surgeon: Sherren Mocha, MD;  Location: [facility] CATH LAB;  Service: Cardiovascular;  Laterality: N/A;  . Right heart catheterization N/A 01/01/2014    Procedure: RIGHT HEART CATH;  Surgeon: Jolaine Artist, MD;  Location: [facility] CATH LAB;  Service: Cardiovascular;  Laterality: N/A;  . Permanent pacemaker insertion N/A 03/24/2014    Procedure: PERMANENT PACEMAKER  INSERTION;  Surgeon: Deboraha Sprang, MD;  Location: [facility] CATH LAB;  Service: Cardiovascular;  Laterality: N/A;  . Av node ablation N/A 04/18/2014    Procedure: AV NODE ABLATION;  Surgeon: Deboraha Sprang, MD;  Location: [facility] CATH LAB;  Service: Cardiovascular;  Laterality: N/A;  . Cataract extraction w/ intraocular lens implant Bilateral 09/28/2015 , 10/19/2015    Prior to Admission medications   Medication Sig Start Date End Date Taking? Authorizing Provider  acetaminophen (TYLENOL) 500 MG tablet Take 2,000 mg by mouth at bedtime as needed for mild pain or headache.   Yes Historical Provider, MD  allopurinol (ZYLOPRIM) 100 MG tablet TAKE 1 TABLET (100 MG TOTAL) BY MOUTH DAILY. 11/10/14  Yes Jolaine Artist, MD  atorvastatin (LIPITOR) 20 MG tablet Take 20 mg by mouth daily at 6 PM.    Yes Historical Provider, MD  benzonatate (TESSALON) 200 MG capsule Take 1 capsule (200 mg total) by mouth 3 (three) times daily as needed for cough. 12/23/14  Yes Tanda Rockers, MD  colchicine 0.6 MG tablet Take 0.6 mg by mouth daily.    Yes Historical Provider, MD  ferrous gluconate (FERGON) 240 (27 FE) MG tablet Take 240 mg by mouth 2 (two) times daily.   Yes Historical Provider, MD  gabapentin (NEURONTIN) 300 MG capsule Take 300 mg by mouth 3 (three) times daily. 12/31/14  Yes Historical Provider, MD  insulin NPH Human (HUMULIN N,NOVOLIN N) 100 UNIT/ML injection Inject 70 Units into the skin 2 (two) times daily before a meal.    Yes Historical Provider, MD  insulin regular (NOVOLIN R,HUMULIN R) 100 units/mL injection Inject 30 Units into the skin 2 (two) times daily before a meal.    Yes Historical Provider, MD  metolazone (ZAROXOLYN) 2.5 MG tablet Take 1 tablet (2.5 mg total) by mouth 3 (three) times a week. Take every Jory Sims and Sat 09/15/15  Yes Larey Dresser, MD  metoprolol succinate (TOPROL-XL) 100 MG 24 hr tablet Take 1 tablet (100 mg total) by mouth 2 (two) times daily. 09/14/15  Yes Amy D Clegg, NP  Multiple  Vitamins-Minerals ([employer] SPECTRAVITE ADULT 50+ PO) Take 1 tablet by mouth daily.   Yes Historical Provider, MD  omeprazole (PRILOSEC) 20 MG capsule Take 1 capsule (20 mg total) by mouth 2 (two) times daily before a meal. 10/04/14  Yes Tanda Rockers, MD  potassium chloride (K-DUR,KLOR-CON) 20 MEQ tablet Take 5 tablets (100 mEq total) by mouth 3 (three) times daily. Take an extra 2 tablets on M,W,Sa with metolazone 11/11/15  Yes Satira Mccallum Tillery, PA-C  sertraline (ZOLOFT) 50 MG tablet Take 50 mg by mouth daily.   Yes Historical Provider, MD  spironolactone (ALDACTONE) 25 MG tablet TAKE 1/2 TABLET BY MOUTH EVERY DAY 01/05/16  Yes Jolaine Artist, MD  torsemide (DEMADEX) 20 MG tablet TAKE 6 TABLETS IN THE MORNING AND  3 TABLETS IN THE EVENING 12/14/15  Yes Jolaine Artist, MD  vitamin C (ASCORBIC ACID) 500 MG tablet Take 500 mg by mouth 2 (two) times daily.   Yes Historical Provider, MD    antalgic gait, soft tissue tenderness over medial joint line, no effusion, negative drawer sign, collateral ligaments intact  Physical Examination: General appearance - alert, well appearing, and in no distress Mental status - alert, oriented to person, place, and time Chest - clear to auscultation, no wheezes, rales or rhonchi, symmetric air entry Heart - normal rate, regular rhythm, normal S1, S2, no murmurs, rubs, clicks or gallops Abdomen - soft, nontender, nondistended, no masses or organomegaly Neurological - alert, oriented, normal speech, no focal findings or movement disorder noted    Asessment/Plan--- Right knee medial meniscal tear- - Plan right knee arthroscopy with meniscal debridement. Procedure risks and potential comps discussed with patient who elects to proceed. Goals are decreased pain and increased function with a high likelihood of achieving both

## 2016-01-20 ENCOUNTER — Ambulatory Visit (HOSPITAL_COMMUNITY): Payer: Medicare Other | Admitting: Anesthesiology

## 2016-01-20 ENCOUNTER — Ambulatory Visit (HOSPITAL_COMMUNITY)
Admission: RE | Admit: 2016-01-20 | Discharge: 2016-01-20 | Disposition: A | Discharge status: Home / Self Care | Payer: Medicare Other | Source: Ambulatory Visit | Attending: Orthopedic Surgery | Admitting: Orthopedic Surgery

## 2016-01-20 ENCOUNTER — Encounter (HOSPITAL_COMMUNITY): Payer: Self-pay | Admitting: *Deleted

## 2016-01-20 ENCOUNTER — Encounter (HOSPITAL_COMMUNITY)
Admission: RE | Disposition: A | Discharge status: Home / Self Care | Payer: Self-pay | Source: Ambulatory Visit | Attending: Orthopedic Surgery

## 2016-01-20 DIAGNOSIS — S83249A Other tear of medial meniscus, current injury, unspecified knee, initial encounter: Secondary | ICD-10-CM | POA: Diagnosis present

## 2016-01-20 DIAGNOSIS — E785 Hyperlipidemia, unspecified: Secondary | ICD-10-CM | POA: Diagnosis not present

## 2016-01-20 DIAGNOSIS — G4733 Obstructive sleep apnea (adult) (pediatric): Secondary | ICD-10-CM | POA: Diagnosis not present

## 2016-01-20 DIAGNOSIS — Z955 Presence of coronary angioplasty implant and graft: Secondary | ICD-10-CM | POA: Diagnosis not present

## 2016-01-20 DIAGNOSIS — Z950 Presence of cardiac pacemaker: Secondary | ICD-10-CM | POA: Insufficient documentation

## 2016-01-20 DIAGNOSIS — I482 Chronic atrial fibrillation: Secondary | ICD-10-CM | POA: Insufficient documentation

## 2016-01-20 DIAGNOSIS — K219 Gastro-esophageal reflux disease without esophagitis: Secondary | ICD-10-CM | POA: Diagnosis not present

## 2016-01-20 DIAGNOSIS — I110 Hypertensive heart disease with heart failure: Secondary | ICD-10-CM | POA: Insufficient documentation

## 2016-01-20 DIAGNOSIS — Z952 Presence of prosthetic heart valve: Secondary | ICD-10-CM | POA: Diagnosis not present

## 2016-01-20 DIAGNOSIS — E119 Type 2 diabetes mellitus without complications: Secondary | ICD-10-CM | POA: Diagnosis not present

## 2016-01-20 DIAGNOSIS — K7460 Unspecified cirrhosis of liver: Secondary | ICD-10-CM | POA: Insufficient documentation

## 2016-01-20 DIAGNOSIS — I2510 Atherosclerotic heart disease of native coronary artery without angina pectoris: Secondary | ICD-10-CM | POA: Insufficient documentation

## 2016-01-20 DIAGNOSIS — Z6841 Body Mass Index (BMI) 40.0 and over, adult: Secondary | ICD-10-CM | POA: Insufficient documentation

## 2016-01-20 DIAGNOSIS — I129 Hypertensive chronic kidney disease with stage 1 through stage 4 chronic kidney disease, or unspecified chronic kidney disease: Secondary | ICD-10-CM | POA: Diagnosis not present

## 2016-01-20 DIAGNOSIS — J449 Chronic obstructive pulmonary disease, unspecified: Secondary | ICD-10-CM | POA: Diagnosis not present

## 2016-01-20 DIAGNOSIS — F17200 Nicotine dependence, unspecified, uncomplicated: Secondary | ICD-10-CM | POA: Insufficient documentation

## 2016-01-20 DIAGNOSIS — I5032 Chronic diastolic (congestive) heart failure: Secondary | ICD-10-CM | POA: Insufficient documentation

## 2016-01-20 DIAGNOSIS — D509 Iron deficiency anemia, unspecified: Secondary | ICD-10-CM | POA: Diagnosis not present

## 2016-01-20 DIAGNOSIS — Z794 Long term (current) use of insulin: Secondary | ICD-10-CM | POA: Insufficient documentation

## 2016-01-20 DIAGNOSIS — M23321 Other meniscus derangements, posterior horn of medial meniscus, right knee: Secondary | ICD-10-CM | POA: Diagnosis not present

## 2016-01-20 DIAGNOSIS — Z79899 Other long term (current) drug therapy: Secondary | ICD-10-CM | POA: Insufficient documentation

## 2016-01-20 DIAGNOSIS — S83241A Other tear of medial meniscus, current injury, right knee, initial encounter: Secondary | ICD-10-CM | POA: Diagnosis not present

## 2016-01-20 DIAGNOSIS — X58XXXA Exposure to other specified factors, initial encounter: Secondary | ICD-10-CM | POA: Insufficient documentation

## 2016-01-20 DIAGNOSIS — Z951 Presence of aortocoronary bypass graft: Secondary | ICD-10-CM | POA: Diagnosis not present

## 2016-01-20 DIAGNOSIS — N189 Chronic kidney disease, unspecified: Secondary | ICD-10-CM | POA: Diagnosis not present

## 2016-01-20 HISTORY — PX: KNEE ARTHROSCOPY: SHX127

## 2016-01-20 LAB — GLUCOSE, CAPILLARY
GLUCOSE-CAPILLARY: 130 mg/dL — AB (ref 65–99)
Glucose-Capillary: 172 mg/dL — ABNORMAL HIGH (ref 65–99)

## 2016-01-20 SURGERY — ARTHROSCOPY, KNEE
Anesthesia: General | Site: Knee | Laterality: Right

## 2016-01-20 MED ORDER — POVIDONE-IODINE 10 % EX SWAB: 2.0000 "application " | Freq: Once | CUTANEOUS | Status: DC

## 2016-01-20 MED ORDER — ONDANSETRON HCL 4 MG/2ML IJ SOLN
INTRAMUSCULAR | Status: AC
  Filled 2016-01-20: qty 2

## 2016-01-20 MED ORDER — FENTANYL CITRATE (PF) 100 MCG/2ML IJ SOLN: 25.0000 ug | INTRAMUSCULAR | Status: DC | PRN

## 2016-01-20 MED ORDER — PROPOFOL 10 MG/ML IV BOLUS
INTRAVENOUS | Status: DC | PRN
  Administered 2016-01-20: 140 mg via INTRAVENOUS

## 2016-01-20 MED ORDER — ONDANSETRON HCL 4 MG/2ML IJ SOLN
INTRAMUSCULAR | Status: DC | PRN
  Administered 2016-01-20: 4 mg via INTRAVENOUS

## 2016-01-20 MED ORDER — SUCCINYLCHOLINE CHLORIDE 20 MG/ML IJ SOLN
INTRAMUSCULAR | Status: DC | PRN
  Administered 2016-01-20: 100 mg via INTRAVENOUS

## 2016-01-20 MED ORDER — EPHEDRINE SULFATE 50 MG/ML IJ SOLN
INTRAMUSCULAR | Status: AC
  Filled 2016-01-20: qty 1

## 2016-01-20 MED ORDER — FENTANYL CITRATE (PF) 250 MCG/5ML IJ SOLN
INTRAMUSCULAR | Status: AC
  Filled 2016-01-20: qty 5

## 2016-01-20 MED ORDER — CHLORHEXIDINE GLUCONATE 4 % EX LIQD: 60.0000 mL | Freq: Once | CUTANEOUS | Status: DC

## 2016-01-20 MED ORDER — METHOCARBAMOL 500 MG PO TABS: 500.0000 mg | ORAL_TABLET | Freq: Four times a day (QID) | ORAL | Status: DC

## 2016-01-20 MED ORDER — PHENYLEPHRINE HCL 10 MG/ML IJ SOLN
INTRAMUSCULAR | Status: DC | PRN
  Administered 2016-01-20 (×3): 40 ug via INTRAVENOUS

## 2016-01-20 MED ORDER — PROPOFOL 10 MG/ML IV BOLUS
INTRAVENOUS | Status: AC
  Filled 2016-01-20: qty 40

## 2016-01-20 MED ORDER — SODIUM CHLORIDE 0.9 % IJ SOLN
INTRAMUSCULAR | Status: AC
  Filled 2016-01-20: qty 10

## 2016-01-20 MED ORDER — DEXAMETHASONE SODIUM PHOSPHATE 10 MG/ML IJ SOLN: 10.0000 mg | Freq: Once | INTRAMUSCULAR | Status: DC

## 2016-01-20 MED ORDER — HYDROMORPHONE HCL 2 MG PO TABS: 2.0000 mg | ORAL_TABLET | ORAL | Status: DC | PRN

## 2016-01-20 MED ORDER — BUPIVACAINE-EPINEPHRINE 0.25% -1:200000 IJ SOLN
INTRAMUSCULAR | Status: AC
  Filled 2016-01-20: qty 1

## 2016-01-20 MED ORDER — ACETAMINOPHEN 10 MG/ML IV SOLN
1000.0000 mg | Freq: Once | INTRAVENOUS | Status: DC
  Filled 2016-01-20: qty 100

## 2016-01-20 MED ORDER — ACETAMINOPHEN 10 MG/ML IV SOLN
INTRAVENOUS | Status: AC
  Filled 2016-01-20: qty 100

## 2016-01-20 MED ORDER — PROMETHAZINE HCL 25 MG/ML IJ SOLN
6.2500 mg | INTRAMUSCULAR | Status: DC | PRN
Start: 2016-01-20 — End: 2016-01-20

## 2016-01-20 MED ORDER — LIDOCAINE HCL (CARDIAC) 20 MG/ML IV SOLN
INTRAVENOUS | Status: DC | PRN
  Administered 2016-01-20: 60 mg via INTRAVENOUS

## 2016-01-20 MED ORDER — LIDOCAINE HCL (CARDIAC) 20 MG/ML IV SOLN
INTRAVENOUS | Status: AC
  Filled 2016-01-20: qty 5

## 2016-01-20 MED ORDER — LACTATED RINGERS IR SOLN
Status: DC | PRN
  Administered 2016-01-20: 6000 mL

## 2016-01-20 MED ORDER — FENTANYL CITRATE (PF) 100 MCG/2ML IJ SOLN
INTRAMUSCULAR | Status: DC | PRN
Start: 2016-01-20 — End: 2016-01-20
  Administered 2016-01-20: 50 ug via INTRAVENOUS
  Administered 2016-01-20: 75 ug via INTRAVENOUS

## 2016-01-20 MED ORDER — BUPIVACAINE-EPINEPHRINE 0.25% -1:200000 IJ SOLN
INTRAMUSCULAR | Status: DC | PRN
  Administered 2016-01-20: 20 mL

## 2016-01-20 MED ORDER — LACTATED RINGERS IV SOLN
INTRAVENOUS | Status: DC
  Administered 2016-01-20: 08:00:00 via INTRAVENOUS

## 2016-01-20 SURGICAL SUPPLY — 28 items
BANDAGE ACE 6X5 VEL STRL LF (GAUZE/BANDAGES/DRESSINGS) ×3 IMPLANT
BLADE 4.2CUDA (BLADE) ×3 IMPLANT
COVER SURGICAL LIGHT HANDLE (MISCELLANEOUS) ×3 IMPLANT
CUFF TOURN SGL QUICK 34 (TOURNIQUET CUFF) ×3
CUFF TRNQT CYL 34X4X40X1 (TOURNIQUET CUFF) ×1 IMPLANT
DRAPE U-SHAPE 47X51 STRL (DRAPES) ×3 IMPLANT
DRSG EMULSION OIL 3X3 NADH (GAUZE/BANDAGES/DRESSINGS) ×3 IMPLANT
DURAPREP 26ML APPLICATOR (WOUND CARE) ×3 IMPLANT
GAUZE SPONGE 4X4 12PLY STRL (GAUZE/BANDAGES/DRESSINGS) ×3 IMPLANT
GLOVE BIO SURGEON STRL SZ8 (GLOVE) ×3 IMPLANT
GLOVE BIOGEL PI IND STRL 8 (GLOVE) ×1 IMPLANT
GLOVE BIOGEL PI INDICATOR 8 (GLOVE) ×2
GOWN STRL REUS W/TWL LRG LVL3 (GOWN DISPOSABLE) ×5 IMPLANT
KIT BASIN OR (CUSTOM PROCEDURE TRAY) ×3 IMPLANT
MANIFOLD NEPTUNE II (INSTRUMENTS) ×3 IMPLANT
MARKER SKIN DUAL TIP RULER LAB (MISCELLANEOUS) ×3 IMPLANT
PACK ARTHROSCOPY WL (CUSTOM PROCEDURE TRAY) ×3 IMPLANT
PACK ICE MAXI GEL EZY WRAP (MISCELLANEOUS) ×9 IMPLANT
PAD ABD 8X10 STRL (GAUZE/BANDAGES/DRESSINGS) ×2 IMPLANT
PADDING CAST ABS 6INX4YD NS (CAST SUPPLIES) ×2
PADDING CAST ABS COTTON 6X4 NS (CAST SUPPLIES) ×1 IMPLANT
PADDING CAST COTTON 6X4 STRL (CAST SUPPLIES) ×6 IMPLANT
POSITIONER SURGICAL ARM (MISCELLANEOUS) IMPLANT
SUT ETHILON 4 0 PS 2 18 (SUTURE) ×3 IMPLANT
TOWEL OR 17X26 10 PK STRL BLUE (TOWEL DISPOSABLE) ×3 IMPLANT
TUBING ARTHRO INFLOW-ONLY STRL (TUBING) ×3 IMPLANT
WAND HAND CNTRL MULTIVAC 90 (MISCELLANEOUS) ×2 IMPLANT
WRAP KNEE MAXI GEL POST OP (GAUZE/BANDAGES/DRESSINGS) ×3 IMPLANT

## 2016-01-20 NOTE — Anesthesia Preprocedure Evaluation (Addendum)
Anesthesia Evaluation  Patient identified by MRN, date of birth, ID band Patient awake  General Assessment Comment:Past Medical History Diagnosis Date . Overweight(278.02)  . CAD (coronary artery disease)    a. s/p CABG 2004; b. [place] 10/13: LHC 8/13: Free radial to obtuse marginal patent, SVG-diagonal patent, LIMA-LAD patent, EF 65-70%, mean aortic valve gradient 42 . Atrial fibrillation ([place])    Permanent; off of Coumadin for now due to GI bleed . DM2 (diabetes mellitus, type 2) (HCC)    at least 10 yrs . Insomnia  . Carotid bruit 06/14/2011   a. pre-AVR dopplers 10/13: no sig ICA stenosis . Hypertension    x 15 yrs . Chronic diastolic heart failure ([place])  . GERD (gastroesophageal reflux disease)  . COPD (chronic obstructive pulmonary disease) ([place])  . Iron deficiency anemia    Requiring intravenous iron . H/O hiatal hernia  . AVM (arteriovenous malformation)    Recurrent GI bleeding requiring multiple transfusions . Hyperlipidemia  . Thrombocytopenia ([place])  . Ascites    status post paracentesis with removal of 3.4 L of ascitic fluid . Osteoarthritis  . Cirrhosis ([place])  . Mediastinal adenopathy 09/22/2011 . Aortic stenosis 03/08/2012   a. s/p tissue AVR 10/13 with Dr. [name]; b. Echo 10/13: mod LVH, EF 55-60%, tissue AVR not well seen, no leak, gradient not too high (mean 32mmHg), MAC, mild MR, mild LAE, PASP 38 . OSA (obstructive sleep apnea) 1999   USES CPAP       Reviewed: Allergy & Precautions, NPO status , Patient's Chart, lab work & pertinent test results  Airway Mallampati: II  TM Distance: >3 FB Neck ROM: Full    Dental  (+) Edentulous Upper, Edentulous Lower   Pulmonary shortness of breath, sleep apnea and Continuous Positive Airway Pressure Ventilation , COPD, former smoker,    Pulmonary exam normal breath  sounds clear to auscultation       Cardiovascular hypertension, Pt. on medications and Pt. on home beta blockers + CAD and +CHF  Normal cardiovascular exam+ dysrhythmias Atrial Fibrillation + pacemaker + Valvular Problems/Murmurs AS  Rhythm:Regular Rate:Normal  Last cardiology office visit of 10-12-15 reviewed.  ECHO 2016:Study Conclusions  - Left ventricle: Poor acoustical windows limits assessment of wall  motion, especially at the apex. Recommend repeat limited study  with definity contrast. The cavity size was normal. There was  moderate concentric hypertrophy. Systolic function was normal.  The estimated ejection fraction was in the range of 55% to 60%.  Wall motion was normal; there were no regional wall motion  abnormalities. The study is not technically sufficient to allow  evaluation of LV diastolic function. Doppler parameters are  consistent with high ventricular filling pressure. - Aortic valve: A bioprosthesis was present and functioning  normally. - Mitral valve: There was mild regurgitation. - Left atrium: The atrium was severely dilated. - Right atrium: The atrium was mildly dilated. - Pulmonary arteries: PA peak pressure: 44 mm Hg (S).  Impressions:  - The right ventricular systolic pressure was increased consistent  with moderate pulmonary hypertension.   Neuro/Psych negative neurological ROS  negative psych ROS   GI/Hepatic hiatal hernia, GERD  ,(+) Cirrhosis       ,   Endo/Other  diabetes, Type 2, Insulin DependentMorbid obesity  Renal/GU Renal disease  negative genitourinary   Musculoskeletal  (+) Arthritis ,   Abdominal (+) + obese,   Peds negative pediatric ROS (+)  Hematology  (+) anemia ,   Anesthesia Other Findings   Reproductive/Obstetrics negative  OB ROS                        Anesthesia Physical Anesthesia Plan  ASA: IV  Anesthesia Plan:    Post-op Pain Management:    Induction:    Airway Management Planned: Oral ETT  Additional Equipment:   Intra-op Plan:   Post-operative Plan:   Informed Consent:   Plan Discussed with:   Anesthesia Plan Comments: (Increased risk due to multiple co-morbidities discussed with patient who voices understanding.)       Anesthesia Quick Evaluation

## 2016-01-20 NOTE — Progress Notes (Signed)
Patient has pain and and swelling in left upper arm x 7-10 days. Area tender to touch.

## 2016-01-20 NOTE — Transfer of Care (Signed)
Immediate Anesthesia Transfer of Care Note  Patient: [name]  Procedure(s) Performed: Procedure(s): ARTHROSCOPY RIGHT KNEE WITH MENSICAL DEBRIDEMENT (Right)  Patient Location: PACU  Anesthesia Type:General  Level of Consciousness: awake, alert  and oriented  Airway & Oxygen Therapy: Patient Spontanous Breathing and Patient connected to face mask oxygen  Post-op Assessment: Report given to RN and Post -op Vital signs reviewed and stable  Post vital signs: Reviewed and stable  Last Vitals:  Filed Vitals:   01/20/16 0608  BP: 128/61  Pulse: 66  Temp: 37 C  Resp: 16    Last Pain:  Filed Vitals:   01/20/16 0637  PainSc: 3       Patients Stated Pain Goal: 4 ([id number])  Complications: No apparent anesthesia complications

## 2016-01-20 NOTE — Discharge Instructions (Signed)
° °Dr. Deke Tilghman °Total Joint Specialist °[facility] °[address] °[place], [place] [address] °[phone] ° ° °Arthroscopic Procedure, Knee °An arthroscopic procedure can find what is wrong with your knee. °PROCEDURE °Arthroscopy is a surgical technique that allows your orthopedic surgeon to diagnose and treat your knee injury with accuracy. They will look into your knee through a small instrument. This is almost like a small (pencil sized) telescope. Because arthroscopy affects your knee less than open knee surgery, you can anticipate a more rapid recovery. Taking an active role by following your caregiver's instructions will help with rapid and complete recovery. Use crutches, rest, elevation, ice, and knee exercises as instructed. The length of recovery depends on various factors including type of injury, age, physical condition, medical conditions, and your rehabilitation. °Your knee is the joint between the large bones (femur and tibia) in your leg. Cartilage covers these bone ends which are smooth and slippery and allow your knee to bend and move smoothly. Two menisci, thick, semi-lunar shaped pads of cartilage which form a rim inside the joint, help absorb shock and stabilize your knee. Ligaments bind the bones together and support your knee joint. Muscles move the joint, help support your knee, and take stress off the joint itself. Because of this all programs and physical therapy to rehabilitate an injured or repaired knee require rebuilding and strengthening your muscles. °AFTER THE PROCEDURE °· After the procedure, you will be moved to a recovery area until most of the effects of the medication have worn off. Your caregiver will discuss the test results with you.  °· Only take over-the-counter or prescription medicines for pain, discomfort, or fever as directed by your caregiver.  °SEEK MEDICAL CARE IF:  °· You have increased bleeding from your wounds.  °· You see  redness, swelling, or have increasing pain in your wounds.  °· You have pus coming from your wound.  °· You have an oral temperature above 102° F (38.9° C).  °· You notice a bad smell coming from the wound or dressing.  °· You have severe pain with any motion of your knee.  °SEEK IMMEDIATE MEDICAL CARE IF:  °· You develop a rash.  °· You have difficulty breathing.  °· You have any allergic problems.  °FURTHER INSTRUCTIONS:  °· ICE to the affected knee every three hours for 30 minutes at a time and then as needed for pain and swelling.  Continue to use ice on the knee for pain and swelling from surgery. You may notice swelling that will progress down to the foot and ankle.  This is normal after surgery.  Elevate the leg when you are not up walking on it.   ° °DIET °You may resume your previous home diet once your are discharged from the hospital. ° °DRESSING / WOUND CARE / SHOWERING °You may start showering two days after being discharged home but do not submerge the incisions under water.  °Change dressing 48 hours after the procedure and then cover the small incisions with band aids until your follow up visit. °Change the surgical dressings daily and reapply a dry dressing each time.  ° °ACTIVITY °Walk with your walker as instructed. °Use walker as long as suggested by your caregivers. °Avoid periods of inactivity such as sitting longer than an hour when not asleep. This helps prevent blood clots.  °You may resume a sexual relationship in one month or when given the OK by your doctor.  °You may return to   work once you are cleared by your doctor.  °Do not drive a car for 6 weeks or until released by you surgeon.  °Do not drive while taking narcotics. ° °WEIGHT BEARING AS TOLERATED ° °POSTOPERATIVE CONSTIPATION PROTOCOL °Constipation - defined medically as fewer than three stools per week and severe constipation as less than one stool per week. ° °One of the most common issues patients have following surgery is  constipation.  Even if you have a regular bowel pattern at home, your normal regimen is likely to be disrupted due to multiple reasons following surgery.  Combination of anesthesia, postoperative narcotics, change in appetite and fluid intake all can affect your bowels.  In order to avoid complications following surgery, here are some recommendations in order to help you during your recovery period. ° °Colace (docusate) - Pick up an over-the-counter form of Colace or another stool softener and take twice a day as long as you are requiring postoperative pain medications.  Take with a full glass of water daily.  If you experience loose stools or diarrhea, hold the colace until you stool forms back up.  If your symptoms do not get better within 1 week or if they get worse, check with your doctor. ° °Dulcolax (bisacodyl) - Pick up over-the-counter and take as directed by the product packaging as needed to assist with the movement of your bowels.  Take with a full glass of water.  Use this product as needed if not relieved by Colace only.  ° °MiraLax (polyethylene glycol) - Pick up over-the-counter to have on hand.  MiraLax is a solution that will increase the amount of water in your bowels to assist with bowel movements.  Take as directed and can mix with a glass of water, juice, soda, coffee, or tea.  Take if you go more than two days without a movement. °Do not use MiraLax more than once per day. Call your doctor if you are still constipated or irregular after using this medication for 7 days in a row. ° °If you continue to have problems with postoperative constipation, please contact the office for further assistance and recommendations.  If you experience "the worst abdominal pain ever" or develop nausea or vomiting, please contact the office immediatly for further recommendations for treatment. ° °ITCHING ° If you experience itching with your medications, try taking only a single pain pill, or even half a pain pill  at a time.  You can also use Benadryl over the counter for itching or also to help with sleep.  ° °TED HOSE STOCKINGS °Wear the elastic stockings on both legs for three weeks following surgery during the day but you may remove then at night for sleeping. ° °MEDICATIONS °See your medication summary on the “After Visit Summary” that the nursing staff will review with you prior to discharge.  You may have some home medications which will be placed on hold until you complete the course of blood thinner medication.  It is important for you to complete the blood thinner medication as prescribed by your surgeon.  Continue your approved medications as instructed at time of discharge. °Do not drive while taking narcotics.  ° °PRECAUTIONS °If you experience chest pain or shortness of breath - call 911 immediately for transfer to the hospital emergency department.  °If you develop a fever greater that 101 F, purulent drainage from wound, increased redness or drainage from wound, foul odor from the wound/dressing, or calf pain - CONTACT YOUR SURGEON.   °                                                °  FOLLOW-UP APPOINTMENTS °Make sure you keep all of your appointments after your operation with your surgeon and caregivers. You should call the office at [phone]  and make an appointment for approximately one week after the date of your surgery or on the date instructed by your surgeon outlined in the "After Visit Summary". ° °RANGE OF MOTION AND STRENGTHENING EXERCISES  °Rehabilitation of the knee is important following a knee injury or an operation. After just a few days of immobilization, the muscles of the thigh which control the knee become weakened and shrink (atrophy). Knee exercises are designed to build up the tone and strength of the thigh muscles and to improve knee motion. Often times heat used for twenty to thirty minutes before working out will loosen up your tissues and help with improving the range of motion  but do not use heat for the first two weeks following surgery. These exercises can be done on a training (exercise) mat, on the floor, on a table or on a bed. Use what ever works the best and is most comfortable for you Knee exercises include: ° °QUAD STRENGTHENING EXERCISES °Strengthening Quadriceps Sets ° °Tighten muscles on top of thigh by pushing knees down into floor or table. °Hold for 20 seconds. Repeat 10 times. °Do 2 sessions per day. ° ° ° ° °Strengthening Terminal Knee Extension ° °With knee bent over bolster, straighten knee by tightening muscle on top of thigh. Be sure to keep bottom of knee on bolster. °Hold for 20 seconds. Repeat 10 times. °Do 2 sessions per day. ° ° °Straight Leg with Bent Knee ° °Lie on back with opposite leg bent. Keep involved knee slightly bent at knee and raise leg 4-6". Hold for 10 seconds. °Repeat 20 times per set. °Do 2 sets per session. °Do 2 sessions per day. ° °

## 2016-01-20 NOTE — Anesthesia Postprocedure Evaluation (Signed)
Anesthesia Post Note  Patient: [name]  Procedure(s) Performed: Procedure(s) (LRB): ARTHROSCOPY RIGHT KNEE WITH MENSICAL DEBRIDEMENT (Right)  Patient location during evaluation: PACU Anesthesia Type: General Level of consciousness: awake and alert Pain management: pain level controlled Vital Signs Assessment: post-procedure vital signs reviewed and stable Respiratory status: spontaneous breathing, nonlabored ventilation, respiratory function stable and patient connected to nasal cannula oxygen Cardiovascular status: blood pressure returned to baseline and stable Postop Assessment: no signs of nausea or vomiting Anesthetic complications: no Comments: Denies complaints.    Last Vitals:  Filed Vitals:   01/20/16 1015 01/20/16 1125  BP: 123/78 115/63  Pulse: 68 73  Temp: 36.6 C   Resp: 16 18    Last Pain:  Filed Vitals:   01/20/16 1156  PainSc: 3                  Emmajo Bennette J

## 2016-01-20 NOTE — Interval H&P Note (Signed)
History and Physical Interval Note:  01/20/2016 7:24 AM  [name]  has presented today for surgery, with the diagnosis of right knee medial mensical tear  The various methods of treatment have been discussed with the patient and family. After consideration of risks, benefits and other options for treatment, the patient has consented to  Procedure(s): ARTHROSCOPY RIGHT KNEE WITH MENSICAL DEBRIDEMENT (Right) as a surgical intervention .  The patient's history has been reviewed, patient examined, no change in status, stable for surgery.  I have reviewed the patient's chart and labs.  Questions were answered to the patient's satisfaction.     Gearlean Alf

## 2016-01-20 NOTE — Op Note (Signed)
Preoperative diagnosis-  Right knee medial meniscal tear  Postoperative diagnosis Right- knee medial meniscal tear plus crystalline gouty arthropathy  Procedure- Right knee arthroscopy with medial  meniscal debridement    Surgeon- Dione Plover. Ishaan Villamar, MD  Anesthesia-General  EBL-  Minimal  Complications- None  Condition- PACU - hemodynamically stable.  Brief clinical note- -[name] is a 70 y.o.  male with a several month history of right knee pain and mechanical symptoms. Exam and history suggested medial meniscal tear confirmed by MRI. The patient presents now for arthroscopy and debridement   Procedure in detail -       After successful administration of General anesthetic, a tourmiquet is placed high on the Right  thigh and the Right lower extremity is prepped and draped in the usual sterile fashion. Time out is performed by the surgical team. Standard superomedial and inferolateral portal sites are marked and incisions made with an 11 blade. The inflow cannula is passed through the superomedial portal and camera through the inferolateral portal and inflow is initiated. Arthroscopic visualization proceeds.      The undersurface of the patella and trochlea are visualized and there are crystal deposits overlying the articular cartilage but no cartilage defects. The medial and lateral gutters are visualized and there are  no loose bodies. Flexion and valgus force is applied to the knee and the medial compartment is entered. A spinal needle is passed into the joint through the site marked for the inferomedial portal. A small incision is made and the dilator passed into the joint. The findings for the medial compartment are crystals overlying the articular cartilage but no defects plus a posterior horn medial meniscal tear. . The tear is debrided to a stable base with baskets and a shaver and sealed off with the Arthrocare. The shaver is used to debride the crystals off the cartilage and no  damage was done to the cartilage. This was done throughout the entire joint.    The intercondylar notch is visualized and the ACL appears normal . The lateral compartment is entered and the findings are crystalline deposition but no tears or cartilage defects. The shaver is used to debride the crystals off the articular cartilage.     The joint is again inspected and there are no other tears, defects or loose bodies identified. The arthroscopic equipment is then removed from the inferior portals which are closed with interrupted 4-0 nylon. 20 ml of .25% Marcaine with epinephrine are injected through the inflow cannula and the cannula is then removed and the portal closed with nylon. The incisions are cleaned and dried and a bulky sterile dressing is applied. The patient is then awakened and transported to recovery in stable condition.   01/20/2016, 9:28 AM

## 2016-01-20 NOTE — Anesthesia Procedure Notes (Signed)
Procedure Name: Intubation Date/Time: 01/20/2016 8:38 AM Performed by: Glory Buff Pre-anesthesia Checklist: Patient identified, Emergency Drugs available, Suction available and Patient being monitored Patient Re-evaluated:Patient Re-evaluated prior to inductionOxygen Delivery Method: Circle system utilized Preoxygenation: Pre-oxygenation with 100% oxygen Intubation Type: IV induction Ventilation: Mask ventilation without difficulty Laryngoscope Size: Miller and 3 Grade View: Grade I Tube type: Oral Tube size: 7.5 mm Number of attempts: 1 Airway Equipment and Method: Stylet and Oral airway Placement Confirmation: ETT inserted through vocal cords under direct vision,  positive ETCO2 and breath sounds checked- equal and bilateral Secured at: 21 cm Tube secured with: Tape Dental Injury: Teeth and Oropharynx as per pre-operative assessment

## 2016-01-20 NOTE — Progress Notes (Signed)
Notified DR Aluisio of pt's HX of intolerance of Aspirin-bleeding ulcers about 27yr ago

## 2016-01-24 LAB — CULTURE, BODY FLUID W GRAM STAIN -BOTTLE: Culture: NO GROWTH

## 2016-01-24 LAB — CULTURE, BODY FLUID-BOTTLE

## 2016-01-28 DIAGNOSIS — Z4789 Encounter for other orthopedic aftercare: Secondary | ICD-10-CM | POA: Diagnosis not present

## 2016-02-16 ENCOUNTER — Other Ambulatory Visit (HOSPITAL_COMMUNITY): Payer: Self-pay | Admitting: *Deleted

## 2016-02-16 MED ORDER — ALLOPURINOL 100 MG PO TABS: ORAL_TABLET | ORAL | 6 refills | Status: DC

## 2016-02-17 ENCOUNTER — Ambulatory Visit (HOSPITAL_COMMUNITY)
Admission: RE | Admit: 2016-02-17 | Discharge: 2016-02-17 | Disposition: A | Discharge status: Home / Self Care | Payer: Medicare Other | Source: Ambulatory Visit | Attending: Cardiology | Admitting: Cardiology

## 2016-02-17 ENCOUNTER — Encounter (HOSPITAL_COMMUNITY): Payer: Self-pay

## 2016-02-17 VITALS — BP 140/80 | HR 76 | Wt 287.2 lb

## 2016-02-17 DIAGNOSIS — I482 Chronic atrial fibrillation: Secondary | ICD-10-CM | POA: Insufficient documentation

## 2016-02-17 DIAGNOSIS — I2510 Atherosclerotic heart disease of native coronary artery without angina pectoris: Secondary | ICD-10-CM | POA: Diagnosis not present

## 2016-02-17 DIAGNOSIS — E1122 Type 2 diabetes mellitus with diabetic chronic kidney disease: Secondary | ICD-10-CM | POA: Insufficient documentation

## 2016-02-17 DIAGNOSIS — I5033 Acute on chronic diastolic (congestive) heart failure: Secondary | ICD-10-CM

## 2016-02-17 DIAGNOSIS — Z8249 Family history of ischemic heart disease and other diseases of the circulatory system: Secondary | ICD-10-CM | POA: Diagnosis not present

## 2016-02-17 DIAGNOSIS — Z79899 Other long term (current) drug therapy: Secondary | ICD-10-CM | POA: Diagnosis not present

## 2016-02-17 DIAGNOSIS — K7581 Nonalcoholic steatohepatitis (NASH): Secondary | ICD-10-CM | POA: Insufficient documentation

## 2016-02-17 DIAGNOSIS — I130 Hypertensive heart and chronic kidney disease with heart failure and stage 1 through stage 4 chronic kidney disease, or unspecified chronic kidney disease: Secondary | ICD-10-CM | POA: Diagnosis not present

## 2016-02-17 DIAGNOSIS — J449 Chronic obstructive pulmonary disease, unspecified: Secondary | ICD-10-CM | POA: Diagnosis not present

## 2016-02-17 DIAGNOSIS — Z794 Long term (current) use of insulin: Secondary | ICD-10-CM | POA: Diagnosis not present

## 2016-02-17 DIAGNOSIS — R140 Abdominal distension (gaseous): Secondary | ICD-10-CM | POA: Insufficient documentation

## 2016-02-17 DIAGNOSIS — N183 Chronic kidney disease, stage 3 (moderate): Secondary | ICD-10-CM | POA: Diagnosis not present

## 2016-02-17 DIAGNOSIS — I5032 Chronic diastolic (congestive) heart failure: Secondary | ICD-10-CM | POA: Diagnosis not present

## 2016-02-17 DIAGNOSIS — I350 Nonrheumatic aortic (valve) stenosis: Secondary | ICD-10-CM

## 2016-02-17 DIAGNOSIS — Z951 Presence of aortocoronary bypass graft: Secondary | ICD-10-CM | POA: Insufficient documentation

## 2016-02-17 DIAGNOSIS — E785 Hyperlipidemia, unspecified: Secondary | ICD-10-CM | POA: Diagnosis not present

## 2016-02-17 DIAGNOSIS — R188 Other ascites: Secondary | ICD-10-CM | POA: Diagnosis not present

## 2016-02-17 DIAGNOSIS — Z87891 Personal history of nicotine dependence: Secondary | ICD-10-CM | POA: Insufficient documentation

## 2016-02-17 DIAGNOSIS — G4733 Obstructive sleep apnea (adult) (pediatric): Secondary | ICD-10-CM | POA: Diagnosis not present

## 2016-02-17 DIAGNOSIS — K219 Gastro-esophageal reflux disease without esophagitis: Secondary | ICD-10-CM | POA: Insufficient documentation

## 2016-02-17 DIAGNOSIS — M109 Gout, unspecified: Secondary | ICD-10-CM | POA: Insufficient documentation

## 2016-02-17 DIAGNOSIS — Z953 Presence of xenogenic heart valve: Secondary | ICD-10-CM | POA: Insufficient documentation

## 2016-02-17 LAB — BASIC METABOLIC PANEL
Anion gap: 11 (ref 5–15)
BUN: 28 mg/dL — ABNORMAL HIGH (ref 6–20)
CHLORIDE: 100 mmol/L — AB (ref 101–111)
CO2: 27 mmol/L (ref 22–32)
CREATININE: 1.56 mg/dL — AB (ref 0.61–1.24)
Calcium: 9 mg/dL (ref 8.9–10.3)
GFR calc non Af Amer: 44 mL/min — ABNORMAL LOW (ref >60)
GFR, EST AFRICAN AMERICAN: 51 mL/min — AB (ref >60)
Glucose, Bld: 154 mg/dL — ABNORMAL HIGH (ref 65–99)
POTASSIUM: 3.9 mmol/L (ref 3.5–5.1)
Sodium: 138 mmol/L (ref 135–145)

## 2016-02-17 LAB — BRAIN NATRIURETIC PEPTIDE: B NATRIURETIC PEPTIDE 5: 262.9 pg/mL — AB (ref 0.0–100.0)

## 2016-02-17 MED ORDER — ALLOPURINOL 100 MG PO TABS: 100.0000 mg | ORAL_TABLET | Freq: Every day | ORAL | 6 refills | Status: DC

## 2016-02-17 MED ORDER — TAMSULOSIN HCL 0.4 MG PO CAPS: 0.4000 mg | ORAL_CAPSULE | Freq: Every day | ORAL | 3 refills | Status: DC

## 2016-02-17 MED ORDER — SPIRONOLACTONE 25 MG PO TABS: 25.0000 mg | ORAL_TABLET | Freq: Two times a day (BID) | ORAL | 5 refills | Status: DC

## 2016-02-17 MED ORDER — TORSEMIDE 20 MG PO TABS: ORAL_TABLET | ORAL | 3 refills | Status: DC

## 2016-02-17 NOTE — Patient Instructions (Signed)
Increase Torsemide to 120 mg (6 tabs) in AM and 100 mg (5 tabs) in PM  Increase Spironolactone to 25 mg (1 tab) Twice daily   Start Flomax 0.4 mg daily  Paracentesis tomorrow Thur 7/27 at 1pm, [employer] AT 12:45 PM  Labs today  Labs in 7-10 days  Your physician recommends that you schedule a follow-up appointment in: 2 weeks

## 2016-02-17 NOTE — Progress Notes (Signed)
Patient ID: [name], male   DOB: [birth year], 70 y.o.   MRN: [id number]    Advanced Heart Failure Clinic Note   GI: Dr Laural Golden- followed for cirrhosis Pulmonary: Dr Melvyn Novas  PCP: Marjean Donna [facility])  Cardiology: Dr. Aundra Dubin  Mr Bonomi is a 70 yo with history of CAD s/p CABG, permanent atrial fibrillation, chronic diastolic CHF, severe AS s/p bioprosthetic AVR who presents for evaluation of diastolic CHF.  Last LHC in 8/13 showed patent grafts.  AVR was in 10/13.  Last echo in 8/16 showed EF 55-60%, normal bioprosthetic aortic valve.   July 2015:  Admitted to the hospital for GI bleed requiring 2 PRBCs. No additional GI work-up with recent EGD showing gastritis.  He had an AV nodal ablation in 9/15 with significant improvement after this.  He has St Jude PPM.   In 6/17, had right knee operation.  He also had a paracentesis with 8 L out after CT abdomen showed cirrhosis and moderate ascites.  He drinks a lot of fluid.  He continues to have significant exertional dyspnea => short of breath just walking 50 feet.  Abdominal distention has returned.  He reports that his urine output is worse, has to strain and stream is weak.  No chest pain.  No lightheadedness.  Weight is up 14 lbs.   Labs (8/14) LDL 38 Labs (3/15) K 3.4, creatinine 1.7, BUN 55  Labs (10/15/13) K 4.0 Creatinine 1.6 BUN 32 Labs (10/29/13) K 4.0 Creatinine 1.6  Labs (4/15) K 4.2 Creatinine 1.32 => 1.5, HCT 35.2 Labs (12/11/13) K 3.6 Creatinine 1.56 Pro BNP 1152 Hemoglobin 9.6  Labs (01/12/14) K 3.0, creatinine 2.0, hemoglobin 8.8 Labs (01/31/14) K 3.3, creatinine 1.49 Labs (8/15) K 3.9, creatinine 1.89 Labs (9/15): K 3.7, creatinine 1.6, AST 48, ALT 52, TSH normal Labs (10/15): K 3.5, creatinine 1.58, BNP 780 Labs (3/16): K 3.5, creatinine 1.6 Labs 12/03/2014: K 4.4 Creatinine 1.55 Labs 12/12/2014: K 3.7 Creatinine 1.48, LDL 50 Labs 8/16: K 3.8, creatinine 2.42 Labs 04/07/2015: K 4.3, Creatinine 2.22, BNP 169 Labs  (11/16): K 4, creatinine 1.78 => 1.55, BNP 120 Labs 11/11/2015 K 3.7 Creatinine 1.53  Labs (6/17): K 3.8, creatinine 1.52  PMH: 1. CAD: s/p CABG 2004. Last LHC (8/13) with patent free radial-OM, patent SVG-D, patent LIMA-LAD.  2. Permanent atrial fibrillation: Not anticoagulated due to GI bleeding.  Holter monitor (4/15) with mean HR 115 (afib).   He had AV nodal ablation with St Jude dual chamber PPM  3. Chronic diastolic CHF: Echo (123456) with EF 55-60%, bioprosthetic aortic valve with mean gradient 22 mmHg, mildly decreased RV systolic function. RHC (12/2013): RA 14, RV 44/4/11, PA 50/23 (33), PCWP 20, v = 35, Fick CO/CI: 6.0/2.6, PVR 2.2 WU, PA 59%.  Echo (8/15) with EF 55-60%, mild LVH, bioprosthetic aortic valve with mean gradient 16 mmHg, PA systolic pressure 46 mmHg, mild to moderate MR.  Echo (8/16) with EF 55-60%, normal bioprosthetic aortic valve, mild MR, PASP 44 mmHg.  - Gynecomastia with spironolactone.  4. Severe aortic stenosis s/p bioprosthetic AVR in 10/13. Mean gradient 22 mmHg across valve on echo in 8/14.  Mean gradient 16 mmHg on echo in 8/15.  5. NASH with cirrhosis: ascites, thrombocytopenia.  CT abdomen in 6/17 showed cirrhosis and moderate ascites. Had paracentesis in 1/15 and in 6/17.  6. 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  EGD in 1/15 showed mild portal gastropathy and 2 small antral ulcers. Recurrent GI bleed in 6/15,  EGD showed gastritis and ?GAVE.  7. Hyperlipidemia 8. HTN 9. GERD 10. COPD 32. OSA: Cannot tolerate CPAP.  12. 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  14. Carotid dopplers (11/15) with minimal stenosis.  15. ABI 12/11/2014: normal    SH: Lives with wife in [place], prior smoker.   FH: CAD  ROS: All systems reviewed and negative except as per HPI.   Current Outpatient Prescriptions on File Prior to Encounter  Medication Sig Dispense Refill  . atorvastatin (LIPITOR) 20 MG tablet Take 20 mg by mouth daily at 6 PM.     . colchicine 0.6 MG tablet Take 0.6  mg by mouth daily.     . ferrous gluconate (FERGON) 240 (27 FE) MG tablet Take 240 mg by mouth 2 (two) times daily.    [name] gabapentin (NEURONTIN) 300 MG capsule Take 300 mg by mouth 3 (three) times daily.  6  . insulin NPH Human (HUMULIN N,NOVOLIN N) 100 UNIT/ML injection Inject 70 Units into the skin 2 (two) times daily before a meal.     . insulin regular (NOVOLIN R,HUMULIN R) 100 units/mL injection Inject 30 Units into the skin 2 (two) times daily before a meal.     . methocarbamol (ROBAXIN) 500 MG tablet Take 1 tablet (500 mg total) by mouth 4 (four) times daily. As needed for muscle spasm 30 tablet 1  . metolazone (ZAROXOLYN) 2.5 MG tablet Take 1 tablet (2.5 mg total) by mouth 3 (three) times a week. Take every Mon, Wed and Sat 15 tablet 6  . metoprolol succinate (TOPROL-XL) 100 MG 24 hr tablet Take 1 tablet (100 mg total) by mouth 2 (two) times daily. 60 tablet 6  . Multiple Vitamins-Minerals ([employer] SPECTRAVITE ADULT 50+ PO) Take 1 tablet by mouth daily.    [name] omeprazole (PRILOSEC) 20 MG capsule Take 1 capsule (20 mg total) by mouth 2 (two) times daily before a meal. 30 capsule 5  . potassium chloride (K-DUR,KLOR-CON) 20 MEQ tablet Take 5 tablets (100 mEq total) by mouth 3 (three) times daily. Take an extra 2 tablets on M,W,Sa with metolazone 500 tablet 6  . sertraline (ZOLOFT) 50 MG tablet Take 50 mg by mouth daily.    . vitamin C (ASCORBIC ACID) 500 MG tablet Take 500 mg by mouth 2 (two) times daily.    [name] acetaminophen (TYLENOL) 500 MG tablet Take 2,000 mg by mouth at bedtime as needed for mild pain or headache.    . benzonatate (TESSALON) 200 MG capsule Take 1 capsule (200 mg total) by mouth 3 (three) times daily as needed for cough. 40 capsule 1   No current facility-administered medications on file prior to encounter.     Vitals:   02/17/16 0909  BP: 140/80  Pulse: 76  SpO2: 96%  Weight: 287 lb 4 oz (130.3 kg)   Wt Readings from Last 3 Encounters:  02/17/16 287 lb 4 oz (130.3 kg)    01/20/16 273 lb (123.8 kg)  01/14/16 284 lb 12.8 oz (129.2 kg)     General: NAD. Ambulated in the clinic without difficulty.  Neck: JVP 14 cm, no thyromegaly or thyroid nodule.  Lungs: CTAB, normal effort CV: Nondisplaced PMI.  Irregular S1/S2, no S3/S4, 2/6 early SEM RUSB.  No edema.  Left carotid bruit.  1+ PT pulses bilaterally.  Abdomen: Soft, obese nontender, no hepatosplenomegaly.  Severely distended abdomen.  Skin: Intact without lesions or rashes.  Neurologic: Alert and oriented x 3.  Psych: Normal affect. Extremities: No clubbing or cyanosis.  Left ankle is warm and tender.   Assessment/Plan: 1. Chronic diastolic CHF:  EF 0000000 (02/2015).  NYHA class III symptoms. He is volume overloaded on exam today with prominent abdominal distention. - Increase torsemide to 120 qam/100 qpm.  Keep KCl at same dosing but will increase spironolactone to 25 mg bid.  - Continue K 100 meq TID with extra 40 meq on metolazone days.  - Continue metolazone Monday/Wednesday/Saturday.  - I will arrange for US-guided paracentesis today.  - BMET/BNP today and BMET in 10 days.   2. Chronic atrial fibrillation:  CHADSVASC 4 - age > 67, CHF, HTN, DM2. 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 We tried ASA, however it was stopped again due to GIB. Given intolerance of even aspirin, suspect Watchman will not be a good idea.  HR was very difficult to control, concerned that elevated HR contributed to CHF and exercise intolerance.  Therefore, he had AV nodal ablation with dual chamber pacing.   3. CKD stage III: BMET today.   4. Bioprosthetic AVR: 8/16 echo showed stable bioprosthetic aortic valve.   5. CAD: s/p CABG. No chest pain.  - Continue atorvastatin 20 mg daily and Toprol XL 100 mg BID. He has been off aspirin and anticoagulation with history of GI bleeding.  6. Cirrhosis: NASH-related. Sees Dr. Laural Golden. Has developed ascites, may end up needing regular  paracenteses.  Also increase spironolactone as above.    7. Gout: Improved    8. ?BPH: He has symptoms consistent with BPH.  I will have him try Flomax.    Follow up in 2 weeks    [name]  02/17/2016

## 2016-02-18 ENCOUNTER — Ambulatory Visit (HOSPITAL_COMMUNITY)
Admission: RE | Admit: 2016-02-18 | Discharge: 2016-02-18 | Disposition: A | Discharge status: Home / Self Care | Payer: Medicare Other | Source: Ambulatory Visit | Attending: Cardiology | Admitting: Cardiology

## 2016-02-18 DIAGNOSIS — R188 Other ascites: Secondary | ICD-10-CM | POA: Diagnosis not present

## 2016-02-18 DIAGNOSIS — Z4789 Encounter for other orthopedic aftercare: Secondary | ICD-10-CM | POA: Diagnosis not present

## 2016-02-18 MED ORDER — LIDOCAINE HCL (PF) 1 % IJ SOLN
INTRAMUSCULAR | Status: AC
  Filled 2016-02-18: qty 10

## 2016-02-18 NOTE — Procedures (Signed)
Successful US guided paracentesis from RLQ.  Yielded 9.7L of hazy, yellow fluid.  No immediate complications.  Pt tolerated well.   Specimen was not sent for labs.  Ascencion Dike PA-C 02/18/2016 3:01 PM

## 2016-02-26 ENCOUNTER — Ambulatory Visit (HOSPITAL_COMMUNITY)
Admission: RE | Admit: 2016-02-26 | Discharge: 2016-02-26 | Disposition: A | Discharge status: Home / Self Care | Payer: Medicare Other | Source: Ambulatory Visit | Attending: Internal Medicine | Admitting: Internal Medicine

## 2016-02-26 DIAGNOSIS — I5032 Chronic diastolic (congestive) heart failure: Secondary | ICD-10-CM | POA: Diagnosis not present

## 2016-02-26 LAB — BASIC METABOLIC PANEL
ANION GAP: 12 (ref 5–15)
BUN: 32 mg/dL — ABNORMAL HIGH (ref 6–20)
CHLORIDE: 96 mmol/L — AB (ref 101–111)
CO2: 27 mmol/L (ref 22–32)
Calcium: 9 mg/dL (ref 8.9–10.3)
Creatinine, Ser: 1.84 mg/dL — ABNORMAL HIGH (ref 0.61–1.24)
GFR calc non Af Amer: 36 mL/min — ABNORMAL LOW (ref >60)
GFR, EST AFRICAN AMERICAN: 42 mL/min — AB (ref >60)
Glucose, Bld: 173 mg/dL — ABNORMAL HIGH (ref 65–99)
POTASSIUM: 3.5 mmol/L (ref 3.5–5.1)
Sodium: 135 mmol/L (ref 135–145)

## 2016-03-03 ENCOUNTER — Ambulatory Visit (HOSPITAL_COMMUNITY)
Admission: RE | Admit: 2016-03-03 | Discharge: 2016-03-03 | Disposition: A | Discharge status: Home / Self Care | Payer: Medicare Other | Source: Ambulatory Visit | Attending: Internal Medicine | Admitting: Internal Medicine

## 2016-03-03 ENCOUNTER — Encounter (HOSPITAL_COMMUNITY): Payer: Self-pay

## 2016-03-03 VITALS — BP 136/70 | HR 92 | Wt 271.4 lb

## 2016-03-03 DIAGNOSIS — Z8249 Family history of ischemic heart disease and other diseases of the circulatory system: Secondary | ICD-10-CM | POA: Insufficient documentation

## 2016-03-03 DIAGNOSIS — R05 Cough: Secondary | ICD-10-CM | POA: Diagnosis not present

## 2016-03-03 DIAGNOSIS — I2510 Atherosclerotic heart disease of native coronary artery without angina pectoris: Secondary | ICD-10-CM | POA: Insufficient documentation

## 2016-03-03 DIAGNOSIS — Z79899 Other long term (current) drug therapy: Secondary | ICD-10-CM | POA: Insufficient documentation

## 2016-03-03 DIAGNOSIS — I130 Hypertensive heart and chronic kidney disease with heart failure and stage 1 through stage 4 chronic kidney disease, or unspecified chronic kidney disease: Secondary | ICD-10-CM | POA: Diagnosis not present

## 2016-03-03 DIAGNOSIS — K219 Gastro-esophageal reflux disease without esophagitis: Secondary | ICD-10-CM | POA: Diagnosis not present

## 2016-03-03 DIAGNOSIS — N183 Chronic kidney disease, stage 3 (moderate): Secondary | ICD-10-CM | POA: Insufficient documentation

## 2016-03-03 DIAGNOSIS — K7460 Unspecified cirrhosis of liver: Secondary | ICD-10-CM | POA: Insufficient documentation

## 2016-03-03 DIAGNOSIS — Z953 Presence of xenogenic heart valve: Secondary | ICD-10-CM | POA: Insufficient documentation

## 2016-03-03 DIAGNOSIS — Z951 Presence of aortocoronary bypass graft: Secondary | ICD-10-CM | POA: Diagnosis not present

## 2016-03-03 DIAGNOSIS — N400 Enlarged prostate without lower urinary tract symptoms: Secondary | ICD-10-CM | POA: Diagnosis not present

## 2016-03-03 DIAGNOSIS — N179 Acute kidney failure, unspecified: Secondary | ICD-10-CM | POA: Diagnosis present

## 2016-03-03 DIAGNOSIS — M109 Gout, unspecified: Secondary | ICD-10-CM | POA: Insufficient documentation

## 2016-03-03 DIAGNOSIS — J449 Chronic obstructive pulmonary disease, unspecified: Secondary | ICD-10-CM | POA: Diagnosis not present

## 2016-03-03 DIAGNOSIS — Z9889 Other specified postprocedural states: Secondary | ICD-10-CM

## 2016-03-03 DIAGNOSIS — Z794 Long term (current) use of insulin: Secondary | ICD-10-CM | POA: Diagnosis not present

## 2016-03-03 DIAGNOSIS — E785 Hyperlipidemia, unspecified: Secondary | ICD-10-CM

## 2016-03-03 DIAGNOSIS — Z87891 Personal history of nicotine dependence: Secondary | ICD-10-CM | POA: Diagnosis not present

## 2016-03-03 DIAGNOSIS — I10 Essential (primary) hypertension: Secondary | ICD-10-CM

## 2016-03-03 DIAGNOSIS — E663 Overweight: Secondary | ICD-10-CM | POA: Diagnosis not present

## 2016-03-03 DIAGNOSIS — I25810 Atherosclerosis of coronary artery bypass graft(s) without angina pectoris: Secondary | ICD-10-CM

## 2016-03-03 DIAGNOSIS — I350 Nonrheumatic aortic (valve) stenosis: Secondary | ICD-10-CM

## 2016-03-03 DIAGNOSIS — R188 Other ascites: Secondary | ICD-10-CM

## 2016-03-03 DIAGNOSIS — R631 Polydipsia: Secondary | ICD-10-CM

## 2016-03-03 DIAGNOSIS — I4821 Permanent atrial fibrillation: Secondary | ICD-10-CM

## 2016-03-03 DIAGNOSIS — E1122 Type 2 diabetes mellitus with diabetic chronic kidney disease: Secondary | ICD-10-CM | POA: Insufficient documentation

## 2016-03-03 DIAGNOSIS — I482 Chronic atrial fibrillation: Secondary | ICD-10-CM | POA: Insufficient documentation

## 2016-03-03 DIAGNOSIS — G4733 Obstructive sleep apnea (adult) (pediatric): Secondary | ICD-10-CM

## 2016-03-03 DIAGNOSIS — N189 Chronic kidney disease, unspecified: Secondary | ICD-10-CM

## 2016-03-03 DIAGNOSIS — I5032 Chronic diastolic (congestive) heart failure: Secondary | ICD-10-CM | POA: Diagnosis not present

## 2016-03-03 DIAGNOSIS — R059 Cough, unspecified: Secondary | ICD-10-CM

## 2016-03-03 LAB — BASIC METABOLIC PANEL
ANION GAP: 12 (ref 5–15)
BUN: 32 mg/dL — ABNORMAL HIGH (ref 6–20)
CHLORIDE: 98 mmol/L — AB (ref 101–111)
CO2: 25 mmol/L (ref 22–32)
Calcium: 9.4 mg/dL (ref 8.9–10.3)
Creatinine, Ser: 1.42 mg/dL — ABNORMAL HIGH (ref 0.61–1.24)
GFR calc non Af Amer: 50 mL/min — ABNORMAL LOW (ref >60)
GFR, EST AFRICAN AMERICAN: 58 mL/min — AB (ref >60)
Glucose, Bld: 141 mg/dL — ABNORMAL HIGH (ref 65–99)
POTASSIUM: 3.3 mmol/L — AB (ref 3.5–5.1)
Sodium: 135 mmol/L (ref 135–145)

## 2016-03-03 MED ORDER — TORSEMIDE 20 MG PO TABS: ORAL_TABLET | ORAL | 3 refills | Status: DC

## 2016-03-03 MED ORDER — BENZONATATE 200 MG PO CAPS: 200.0000 mg | ORAL_CAPSULE | Freq: Three times a day (TID) | ORAL | 1 refills | Status: DC | PRN

## 2016-03-03 NOTE — Patient Instructions (Signed)
Labs today  Your physician recommends that you schedule a follow-up appointment in: 4 weeks  Do the following things EVERYDAY: 1) Weigh yourself in the morning before breakfast. Write it down and keep it in a log. 2) Take your medicines as prescribed 3) Eat low salt foods-Limit salt (sodium) to 2000 mg per day.  4) Stay as active as you can everyday 5) Limit all fluids for the day to less than 2 liters

## 2016-03-03 NOTE — Progress Notes (Addendum)
Patient ID: [name], male   DOB: [birth year], 70 y.o.   MRN: [id number]    Advanced Heart Failure Clinic Note   GI: Dr Laural Golden- followed for cirrhosis Pulmonary: Dr Melvyn Novas  PCP: Marjean Donna [facility])  Cardiology: Dr. Aundra Dubin  [name] is a 70 yo with history of CAD s/p CABG, permanent atrial fibrillation, chronic diastolic CHF, severe AS s/p bioprosthetic AVR who presents for evaluation of diastolic CHF.  Last LHC in 8/13 showed patent grafts.  AVR was in 10/13.  Last echo in 8/16 showed EF 55-60%, normal bioprosthetic aortic valve.   July 2015:  Admitted to the hospital for GI bleed requiring 2 PRBCs. No additional GI work-up with recent EGD showing gastritis.  He had an AV nodal ablation in 9/15 with significant improvement after this.  He has St Jude PPM.   In 6/17, had right knee operation.    He presents today for regular follow up. He is down 16 lbs from last visit.  He is s/p paracentesis with 9.7L out on 02/18/16. Has only gained 3-4 lbs on his home scale. Peeing a lot better after paracentesis and on flomax. Breathing is better, but still not great. Can walk about 150 ft (up from 50 ft at last visit). Denies CP. Still drinking 140 oz + daily. (up to 213 oz in one day in the past week)    Labs (8/14) LDL 38 Labs (3/15) K 3.4, creatinine 1.7, BUN 55  Labs (10/15/13) K 4.0 Creatinine 1.6 BUN 32 Labs (10/29/13) K 4.0 Creatinine 1.6  Labs (4/15) K 4.2 Creatinine 1.32 => 1.5, HCT 35.2 Labs (12/11/13) K 3.6 Creatinine 1.56 Pro BNP 1152 Hemoglobin 9.6  Labs (01/12/14) K 3.0, creatinine 2.0, hemoglobin 8.8 Labs (01/31/14) K 3.3, creatinine 1.49 Labs (8/15) K 3.9, creatinine 1.89 Labs (9/15): K 3.7, creatinine 1.6, AST 48, ALT 52, TSH normal Labs (10/15): K 3.5, creatinine 1.58, BNP 780 Labs (3/16): K 3.5, creatinine 1.6 Labs 12/03/2014: K 4.4 Creatinine 1.55 Labs 12/12/2014: K 3.7 Creatinine 1.48, LDL 50 Labs 8/16: K 3.8, creatinine 2.42 Labs 04/07/2015: K 4.3, Creatinine  2.22, BNP 169 Labs (11/16): K 4, creatinine 1.78 => 1.55, BNP 120 Labs 11/11/2015 K 3.7 Creatinine 1.53  Labs (6/17): K 3.8, creatinine 1.52  PMH: 1. CAD: s/p CABG 2004. Last LHC (8/13) with patent free radial-OM, patent SVG-D, patent LIMA-LAD.  2. Permanent atrial fibrillation: Not anticoagulated due to GI bleeding.  Holter monitor (4/15) with mean HR 115 (afib).   He had AV nodal ablation with St Jude dual chamber PPM  3. Chronic diastolic CHF: Echo (123456) with EF 55-60%, bioprosthetic aortic valve with mean gradient 22 mmHg, mildly decreased RV systolic function. RHC (12/2013): RA 14, RV 44/4/11, PA 50/23 (33), PCWP 20, v = 35, Fick CO/CI: 6.0/2.6, PVR 2.2 WU, PA 59%.  Echo (8/15) with EF 55-60%, mild LVH, bioprosthetic aortic valve with mean gradient 16 mmHg, PA systolic pressure 46 mmHg, mild to moderate Matthew.  Echo (8/16) with EF 55-60%, normal bioprosthetic aortic valve, mild Matthew, PASP 44 mmHg.  - Gynecomastia with spironolactone.  4. Severe aortic stenosis s/p bioprosthetic AVR in 10/13. Mean gradient 22 mmHg across valve on echo in 8/14.  Mean gradient 16 mmHg on echo in 8/15.  5. NASH with cirrhosis: ascites, thrombocytopenia.  CT abdomen in 6/17 showed cirrhosis and moderate ascites. Had paracentesis in 1/15 and in 6/17.  6. 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  EGD in 1/15 showed mild portal gastropathy  and 2 small antral ulcers. Recurrent GI bleed in 6/15, EGD showed gastritis and ?GAVE.  7. Hyperlipidemia 8. HTN 9. GERD 10. COPD 92. OSA: Cannot tolerate CPAP.  12. 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  14. Carotid dopplers (11/15) with minimal stenosis.  15. ABI 12/11/2014: normal    SH: Lives with wife in [place], prior smoker.   FH: CAD  ROS: All systems reviewed and negative except as per HPI.   Current Outpatient Prescriptions on File Prior to Encounter  Medication Sig Dispense Refill  . acetaminophen (TYLENOL) 500 MG tablet Take 2,000 mg by mouth at bedtime as needed for mild  pain or headache.    . allopurinol (ZYLOPRIM) 100 MG tablet Take 1 tablet (100 mg total) by mouth daily. TAKE 1 TABLET (100 MG TOTAL) BY MOUTH DAILY. 30 tablet 6  . atorvastatin (LIPITOR) 20 MG tablet Take 20 mg by mouth daily at 6 PM.     . benzonatate (TESSALON) 200 MG capsule Take 1 capsule (200 mg total) by mouth 3 (three) times daily as needed for cough. 40 capsule 1  . colchicine 0.6 MG tablet Take 0.6 mg by mouth daily.     . ferrous gluconate (FERGON) 240 (27 FE) MG tablet Take 240 mg by mouth 2 (two) times daily.    [name] gabapentin (NEURONTIN) 300 MG capsule Take 300 mg by mouth 3 (three) times daily.  6  . insulin NPH Human (HUMULIN N,NOVOLIN N) 100 UNIT/ML injection Inject 70 Units into the skin 2 (two) times daily before a meal.     . insulin regular (NOVOLIN R,HUMULIN R) 100 units/mL injection Inject 30 Units into the skin 2 (two) times daily before a meal.     . methocarbamol (ROBAXIN) 500 MG tablet Take 1 tablet (500 mg total) by mouth 4 (four) times daily. As needed for muscle spasm 30 tablet 1  . metolazone (ZAROXOLYN) 2.5 MG tablet Take 1 tablet (2.5 mg total) by mouth 3 (three) times a week. Take every Mon, Wed and Sat 15 tablet 6  . metoprolol succinate (TOPROL-XL) 100 MG 24 hr tablet Take 1 tablet (100 mg total) by mouth 2 (two) times daily. 60 tablet 6  . Multiple Vitamins-Minerals (CVS SPECTRAVITE ADULT 50+ PO) Take 1 tablet by mouth daily.    [name] omeprazole (PRILOSEC) 20 MG capsule Take 1 capsule (20 mg total) by mouth 2 (two) times daily before a meal. 30 capsule 5  . potassium chloride (K-DUR,KLOR-CON) 20 MEQ tablet Take 5 tablets (100 mEq total) by mouth 3 (three) times daily. Take an extra 2 tablets on M,W,Sa with metolazone 500 tablet 6  . sertraline (ZOLOFT) 50 MG tablet Take 50 mg by mouth daily.    [name] spironolactone (ALDACTONE) 25 MG tablet Take 1 tablet (25 mg total) by mouth 2 (two) times daily. 60 tablet 5  . tamsulosin (FLOMAX) 0.4 MG CAPS capsule Take 1 capsule (0.4 mg  total) by mouth daily after supper. 30 capsule 3  . torsemide (DEMADEX) 20 MG tablet Take 6 tabs in AM and 5 tabs in PM 330 tablet 3  . vitamin C (ASCORBIC ACID) 500 MG tablet Take 500 mg by mouth 2 (two) times daily.     No current facility-administered medications on file prior to encounter.     Vitals:   03/03/16 0933  BP: 136/70  Pulse: 92  SpO2: 91%  Weight: 271 lb 6.4 oz (123.1 kg)     Wt Readings from Last 3 Encounters:  03/03/16 271 lb  6.4 oz (123.1 kg)  02/17/16 287 lb 4 oz (130.3 kg)  01/20/16 273 lb (123.8 kg)     General: NAD. Ambulated in the clinic without difficulty.  Neck: JVP 9-10 cm, no thyromegaly or thyroid nodule.  Lungs: Clear, normal effort CV: Nondisplaced PMI.  Irregular S1/S2, no S3/S4, 2/6 early SEM RUSB.  No edema.  Left carotid bruit.  1+ PT pulses bilaterally.  Abdomen: Soft, obese, NT, moderately distended, no HSM. No bruits or masses. +BS. Varicose veins.  Skin: Intact without lesions or rashes.  Neurologic: Alert and oriented x 3.  Psych: Normal affect. Extremities: No clubbing or cyanosis.   Assessment/Plan: 1. Chronic diastolic CHF:  EF 0000000 (02/2015).  NYHA class III symptoms.  - Volume status much improved with paracentesis and increased torsemide.  He continues to drink > 140 oz of fluid daily. Encouraged to gradually cut back so that we can adjust his torsemide as his body adjusts.  - Continue torsemide 120 qam/100 qpm. BMET today.  - Continue K 100 meq TID with extra 40 meq on metolazone days.  - Continue metolazone Monday/Wednesday/Saturday.  - May need regular US-guided paracentesis  - BMET/BNP today and BMET in 10 days.   2. Chronic atrial fibrillation:  CHADSVASC 4 - age > 75, CHF, HTN, DM2. 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  - s/p AV node ablation with dual chamber pacing with difficult rate control and poor outcomes with anticoagulation.  - Intolerant to ASA and coumadin  as above, so no Watchman pursued. 3. CKD stage III:  - Stable on recent labs. Recheck BMET today.    4. Bioprosthetic AVR: 8/16 echo showed stable bioprosthetic aortic valve.   5. CAD: s/p CABG. No chest pain.  - Continue atorvastatin 20 mg daily and Toprol XL 100 mg BID. He has been off aspirin and anticoagulation with history of GI bleeding.  6. Cirrhosis:  - NASH-related.Follows with Dr. Laural Golden. - S/p US Paracentesis with > 9 L removed.  He may need regular paracentesis. - Continue spironolactone 25 mg BID.   7. Gout:  - Stable. Per PCP.     8. BPH: - Seems to have improved on Flomax, but picture confounded by significant paracentesis.  Would recommend he still see urologist.  9. Dry cough - Will refill tessalon. Follow up with PCP. No fevers, chills, or production.    Follow up in 4 weeks. BMET today.    Satira Mccallum [facility]  03/03/2016

## 2016-03-03 NOTE — Progress Notes (Signed)
Advanced Heart Failure Medication Review by a Pharmacist  Does the patient  feel that his/her medications are working for him/her?  yes  Has the patient been experiencing any side effects to the medications prescribed?  no  Does the patient measure his/her own blood pressure or blood glucose at home?  yes   Does the patient have any problems obtaining medications due to transportation or finances?   no  Understanding of regimen: good Understanding of indications: good Potential of compliance: good Patient understands to avoid NSAIDs. Patient understands to avoid decongestants.  Issues to address at subsequent visits: None   Pharmacist comments:  [name] is a pleasant 70 yo M presenting with his wife and his medication bottles. He reports great compliance with his regimen and did not have any specific medication-related questions or concerns for me at this time.   [name], PharmD, BCPS, CPP Clinical Pharmacist Pager: [phone] Phone: [phone] 03/03/2016 9:41 AM      Time with patient: 12 minutes Preparation and documentation time: 2 minutes Total time: 14 minutes

## 2016-03-04 ENCOUNTER — Telehealth (HOSPITAL_COMMUNITY): Payer: Self-pay | Admitting: Cardiology

## 2016-03-04 DIAGNOSIS — I509 Heart failure, unspecified: Secondary | ICD-10-CM

## 2016-03-04 MED ORDER — POTASSIUM CHLORIDE CRYS ER 20 MEQ PO TBCR: 80.0000 meq | EXTENDED_RELEASE_TABLET | Freq: Four times a day (QID) | ORAL | 6 refills | Status: DC

## 2016-03-04 NOTE — Telephone Encounter (Signed)
Patient aware. Voiced understanding repeat labs 8/21

## 2016-03-04 NOTE — Telephone Encounter (Signed)
-----   Message from Shirley Friar, PA-C sent at 03/03/2016 10:43 AM EDT ----- K low but on significant amounts of oral potassium.  No good option to increase K with limited absorption. Discussed importance of fluid restriction in clinic today.  Please repeat BMET 7-10 days.   Creatinine improved from last.     Legrand Como "Jonni Sanger" Moneta, PA-C 03/03/2016 10:43 AM

## 2016-03-14 ENCOUNTER — Ambulatory Visit (HOSPITAL_COMMUNITY)
Admission: RE | Admit: 2016-03-14 | Discharge: 2016-03-14 | Disposition: A | Discharge status: Home / Self Care | Payer: Medicare Other | Source: Ambulatory Visit | Attending: Cardiology | Admitting: Cardiology

## 2016-03-14 DIAGNOSIS — I509 Heart failure, unspecified: Secondary | ICD-10-CM | POA: Insufficient documentation

## 2016-03-14 DIAGNOSIS — I5032 Chronic diastolic (congestive) heart failure: Secondary | ICD-10-CM

## 2016-03-14 DIAGNOSIS — R188 Other ascites: Secondary | ICD-10-CM

## 2016-03-14 LAB — BASIC METABOLIC PANEL
Anion gap: 10 (ref 5–15)
BUN: 39 mg/dL — ABNORMAL HIGH (ref 6–20)
CHLORIDE: 104 mmol/L (ref 101–111)
CO2: 25 mmol/L (ref 22–32)
Calcium: 9.1 mg/dL (ref 8.9–10.3)
Creatinine, Ser: 1.74 mg/dL — ABNORMAL HIGH (ref 0.61–1.24)
GFR calc non Af Amer: 39 mL/min — ABNORMAL LOW (ref >60)
GFR, EST AFRICAN AMERICAN: 45 mL/min — AB (ref >60)
Glucose, Bld: 174 mg/dL — ABNORMAL HIGH (ref 65–99)
POTASSIUM: 4.3 mmol/L (ref 3.5–5.1)
SODIUM: 139 mmol/L (ref 135–145)

## 2016-03-14 NOTE — Addendum Note (Signed)
Encounter addended by: Kerry Dory, CMA on: 03/14/2016 12:32 PM<BR>    Actions taken: Visit diagnoses modified, Order Entry activity accessed, Diagnosis association updated

## 2016-03-15 ENCOUNTER — Ambulatory Visit (HOSPITAL_COMMUNITY)
Admission: RE | Admit: 2016-03-15 | Discharge: 2016-03-15 | Disposition: A | Discharge status: Home / Self Care | Payer: Medicare Other | Source: Ambulatory Visit | Attending: Student | Admitting: Student

## 2016-03-15 DIAGNOSIS — I5032 Chronic diastolic (congestive) heart failure: Secondary | ICD-10-CM | POA: Insufficient documentation

## 2016-03-15 DIAGNOSIS — R188 Other ascites: Secondary | ICD-10-CM | POA: Insufficient documentation

## 2016-03-15 NOTE — Progress Notes (Signed)
Paracentesis complete no signs of distress. 8400 ml amber colored ascites removed.

## 2016-03-31 ENCOUNTER — Encounter (HOSPITAL_COMMUNITY): Payer: Self-pay

## 2016-03-31 ENCOUNTER — Ambulatory Visit (HOSPITAL_COMMUNITY)
Admission: RE | Admit: 2016-03-31 | Discharge: 2016-03-31 | Disposition: A | Discharge status: Home / Self Care | Payer: Medicare Other | Source: Ambulatory Visit | Attending: Cardiology | Admitting: Cardiology

## 2016-03-31 VITALS — BP 120/74 | HR 103 | Wt 271.4 lb

## 2016-03-31 DIAGNOSIS — Z87891 Personal history of nicotine dependence: Secondary | ICD-10-CM | POA: Insufficient documentation

## 2016-03-31 DIAGNOSIS — I350 Nonrheumatic aortic (valve) stenosis: Secondary | ICD-10-CM

## 2016-03-31 DIAGNOSIS — G4733 Obstructive sleep apnea (adult) (pediatric): Secondary | ICD-10-CM | POA: Insufficient documentation

## 2016-03-31 DIAGNOSIS — Z6841 Body Mass Index (BMI) 40.0 and over, adult: Secondary | ICD-10-CM | POA: Insufficient documentation

## 2016-03-31 DIAGNOSIS — Z953 Presence of xenogenic heart valve: Secondary | ICD-10-CM | POA: Insufficient documentation

## 2016-03-31 DIAGNOSIS — R631 Polydipsia: Secondary | ICD-10-CM

## 2016-03-31 DIAGNOSIS — Z951 Presence of aortocoronary bypass graft: Secondary | ICD-10-CM | POA: Insufficient documentation

## 2016-03-31 DIAGNOSIS — Z9889 Other specified postprocedural states: Secondary | ICD-10-CM | POA: Diagnosis not present

## 2016-03-31 DIAGNOSIS — Z794 Long term (current) use of insulin: Secondary | ICD-10-CM | POA: Diagnosis not present

## 2016-03-31 DIAGNOSIS — E663 Overweight: Secondary | ICD-10-CM

## 2016-03-31 DIAGNOSIS — M109 Gout, unspecified: Secondary | ICD-10-CM | POA: Diagnosis not present

## 2016-03-31 DIAGNOSIS — E1122 Type 2 diabetes mellitus with diabetic chronic kidney disease: Secondary | ICD-10-CM | POA: Diagnosis not present

## 2016-03-31 DIAGNOSIS — I130 Hypertensive heart and chronic kidney disease with heart failure and stage 1 through stage 4 chronic kidney disease, or unspecified chronic kidney disease: Secondary | ICD-10-CM | POA: Diagnosis not present

## 2016-03-31 DIAGNOSIS — I482 Chronic atrial fibrillation: Secondary | ICD-10-CM | POA: Diagnosis not present

## 2016-03-31 DIAGNOSIS — K7460 Unspecified cirrhosis of liver: Secondary | ICD-10-CM | POA: Insufficient documentation

## 2016-03-31 DIAGNOSIS — K219 Gastro-esophageal reflux disease without esophagitis: Secondary | ICD-10-CM | POA: Insufficient documentation

## 2016-03-31 DIAGNOSIS — N183 Chronic kidney disease, stage 3 (moderate): Secondary | ICD-10-CM | POA: Diagnosis not present

## 2016-03-31 DIAGNOSIS — Z7901 Long term (current) use of anticoagulants: Secondary | ICD-10-CM | POA: Diagnosis not present

## 2016-03-31 DIAGNOSIS — N400 Enlarged prostate without lower urinary tract symptoms: Secondary | ICD-10-CM | POA: Insufficient documentation

## 2016-03-31 DIAGNOSIS — I25810 Atherosclerosis of coronary artery bypass graft(s) without angina pectoris: Secondary | ICD-10-CM

## 2016-03-31 DIAGNOSIS — I5032 Chronic diastolic (congestive) heart failure: Secondary | ICD-10-CM | POA: Insufficient documentation

## 2016-03-31 DIAGNOSIS — Z8249 Family history of ischemic heart disease and other diseases of the circulatory system: Secondary | ICD-10-CM | POA: Diagnosis not present

## 2016-03-31 DIAGNOSIS — I2510 Atherosclerotic heart disease of native coronary artery without angina pectoris: Secondary | ICD-10-CM | POA: Diagnosis not present

## 2016-03-31 DIAGNOSIS — E785 Hyperlipidemia, unspecified: Secondary | ICD-10-CM | POA: Diagnosis not present

## 2016-03-31 DIAGNOSIS — J449 Chronic obstructive pulmonary disease, unspecified: Secondary | ICD-10-CM | POA: Insufficient documentation

## 2016-03-31 DIAGNOSIS — R188 Other ascites: Secondary | ICD-10-CM | POA: Insufficient documentation

## 2016-03-31 DIAGNOSIS — I10 Essential (primary) hypertension: Secondary | ICD-10-CM

## 2016-03-31 NOTE — Progress Notes (Signed)
Patient ID: [name], male   DOB: [birth year], 70 y.o.   MRN: [id number]    Advanced Heart Failure Clinic Note   GI: Dr Laural Golden- followed for cirrhosis Pulmonary: Dr Melvyn Novas  PCP: Marjean Donna [facility])  Cardiology: Dr. Aundra Dubin  [name] is a 70 yo with history of CAD s/p CABG, permanent atrial fibrillation, chronic diastolic CHF, severe AS s/p bioprosthetic AVR who presents for evaluation of diastolic CHF.  Last LHC in 8/13 showed patent grafts.  AVR was in 10/13.  Last echo in 8/16 showed EF 55-60%, normal bioprosthetic aortic valve.   July 2015:  Admitted to the hospital for GI bleed requiring 2 PRBCs. No additional GI work-up with recent EGD showing gastritis.  He had an AV nodal ablation in 9/15 with significant improvement after this.  He has St Jude PPM.   In 6/17, had right knee operation.    He presents today for regular visit.  Again reinforced fluid restriction with intake > 140 oz daily. Had US Paracentesis 03/15/16 with 8.4L removed. His weight has only gone up 3 lbs on his home scale since then.  Weighs 267 lbs on his home scale. Drinking around 130 - 140 oz a day. (Had been upwards of 200 oz, so an improvement for him). Can walk 150-200 feet without SOB.  No CP. Son is in hospital now with renal issues.    Labs (8/14) LDL 38 Labs (3/15) K 3.4, creatinine 1.7, BUN 55  Labs (10/15/13) K 4.0 Creatinine 1.6 BUN 32 Labs (10/29/13) K 4.0 Creatinine 1.6  Labs (4/15) K 4.2 Creatinine 1.32 => 1.5, HCT 35.2 Labs (12/11/13) K 3.6 Creatinine 1.56 Pro BNP 1152 Hemoglobin 9.6  Labs (01/12/14) K 3.0, creatinine 2.0, hemoglobin 8.8 Labs (01/31/14) K 3.3, creatinine 1.49 Labs (8/15) K 3.9, creatinine 1.89 Labs (9/15): K 3.7, creatinine 1.6, AST 48, ALT 52, TSH normal Labs (10/15): K 3.5, creatinine 1.58, BNP 780 Labs (3/16): K 3.5, creatinine 1.6 Labs 12/03/2014: K 4.4 Creatinine 1.55 Labs 12/12/2014: K 3.7 Creatinine 1.48, LDL 50 Labs 8/16: K 3.8, creatinine 2.42 Labs 04/07/2015:  K 4.3, Creatinine 2.22, BNP 169 Labs (11/16): K 4, creatinine 1.78 => 1.55, BNP 120 Labs 11/11/2015 K 3.7 Creatinine 1.53  Labs (6/17): K 3.8, creatinine 1.52  PMH: 1. CAD: s/p CABG 2004. Last LHC (8/13) with patent free radial-OM, patent SVG-D, patent LIMA-LAD.  2. Permanent atrial fibrillation: Not anticoagulated due to GI bleeding.  Holter monitor (4/15) with mean HR 115 (afib).   He had AV nodal ablation with St Jude dual chamber PPM  3. Chronic diastolic CHF: Echo (123456) with EF 55-60%, bioprosthetic aortic valve with mean gradient 22 mmHg, mildly decreased RV systolic function. RHC (12/2013): RA 14, RV 44/4/11, PA 50/23 (33), PCWP 20, v = 35, Fick CO/CI: 6.0/2.6, PVR 2.2 WU, PA 59%.  Echo (8/15) with EF 55-60%, mild LVH, bioprosthetic aortic valve with mean gradient 16 mmHg, PA systolic pressure 46 mmHg, mild to moderate Matthew.  Echo (8/16) with EF 55-60%, normal bioprosthetic aortic valve, mild Matthew, PASP 44 mmHg.  - Gynecomastia with spironolactone.  4. Severe aortic stenosis s/p bioprosthetic AVR in 10/13. Mean gradient 22 mmHg across valve on echo in 8/14.  Mean gradient 16 mmHg on echo in 8/15.  5. NASH with cirrhosis: ascites, thrombocytopenia.  CT abdomen in 6/17 showed cirrhosis and moderate ascites. Had paracentesis in 1/15 and in 6/17.  6. 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  EGD in 1/15 showed mild portal  gastropathy and 2 small antral ulcers. Recurrent GI bleed in 6/15, EGD showed gastritis and ?GAVE.  7. Hyperlipidemia 8. HTN 9. GERD 10. COPD 44. OSA: Cannot tolerate CPAP.  12. 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  14. Carotid dopplers (11/15) with minimal stenosis.  15. ABI 12/11/2014: normal    SH: Lives with wife in [place], prior smoker.   FH: CAD  ROS: All systems reviewed and negative except as per HPI.   Current Outpatient Prescriptions on File Prior to Encounter  Medication Sig Dispense Refill  . acetaminophen (TYLENOL) 500 MG tablet Take 2,000 mg by mouth at bedtime as  needed for mild pain or headache.    . allopurinol (ZYLOPRIM) 100 MG tablet Take 1 tablet (100 mg total) by mouth daily. TAKE 1 TABLET (100 MG TOTAL) BY MOUTH DAILY. 30 tablet 6  . atorvastatin (LIPITOR) 20 MG tablet Take 20 mg by mouth daily at 6 PM.     . benzonatate (TESSALON) 200 MG capsule Take 1 capsule (200 mg total) by mouth 3 (three) times daily as needed for cough. 100 capsule 1  . colchicine 0.6 MG tablet Take 0.6 mg by mouth daily.     . ferrous gluconate (FERGON) 240 (27 FE) MG tablet Take 240 mg by mouth 2 (two) times daily.    [name] gabapentin (NEURONTIN) 300 MG capsule Take 300 mg by mouth 3 (three) times daily.  6  . insulin NPH Human (HUMULIN N,NOVOLIN N) 100 UNIT/ML injection Inject 70 Units into the skin 2 (two) times daily before a meal.     . insulin regular (NOVOLIN R,HUMULIN R) 100 units/mL injection Inject 30 Units into the skin 2 (two) times daily before a meal.     . methocarbamol (ROBAXIN) 500 MG tablet Take 1 tablet (500 mg total) by mouth 4 (four) times daily. As needed for muscle spasm 30 tablet 1  . metolazone (ZAROXOLYN) 2.5 MG tablet Take 1 tablet (2.5 mg total) by mouth 3 (three) times a week. Take every Mon, Wed and Sat 15 tablet 6  . metoprolol succinate (TOPROL-XL) 100 MG 24 hr tablet Take 1 tablet (100 mg total) by mouth 2 (two) times daily. 60 tablet 6  . Multiple Vitamins-Minerals ([employer] SPECTRAVITE ADULT 50+ PO) Take 1 tablet by mouth daily.    [name] omeprazole (PRILOSEC) 20 MG capsule Take 1 capsule (20 mg total) by mouth 2 (two) times daily before a meal. 30 capsule 5  . potassium chloride SA (K-DUR,KLOR-CON) 20 MEQ tablet Take 4 tablets (80 mEq total) by mouth 4 (four) times daily. Take an extra 2 tablets on M,W,Sa with metolazone 500 tablet 6  . sertraline (ZOLOFT) 50 MG tablet Take 50 mg by mouth daily.    [name] spironolactone (ALDACTONE) 25 MG tablet Take 1 tablet (25 mg total) by mouth 2 (two) times daily. 60 tablet 5  . tamsulosin (FLOMAX) 0.4 MG CAPS capsule Take  1 capsule (0.4 mg total) by mouth daily after supper. 30 capsule 3  . torsemide (DEMADEX) 20 MG tablet Take 6 tabs in AM and 5 tabs in PM 330 tablet 3  . vitamin C (ASCORBIC ACID) 500 MG tablet Take 500 mg by mouth 2 (two) times daily.     No current facility-administered medications on file prior to encounter.     Vitals:   03/31/16 0928  BP: 120/74  BP Location: Left Arm  Patient Position: Sitting  Cuff Size: Normal  Pulse: (!) 103  SpO2: 94%  Weight: 271 lb 6.4 oz (  123.1 kg)     Wt Readings from Last 3 Encounters:  03/31/16 271 lb 6.4 oz (123.1 kg)  03/03/16 271 lb 6.4 oz (123.1 kg)  02/17/16 287 lb 4 oz (130.3 kg)     General: NAD. Ambulated in the clinic without difficulty.  Neck: JVP 9-10 cm, no thyromegaly or thyroid nodule.  Lungs: Clear, normal effort CV: Nondisplaced PMI.  Irregular S1/S2, no S3/S4, 2/6 early SEM RUSB.  No edema.  Left carotid bruit.  1+ PT pulses bilaterally.  Abdomen: Soft, obese, NT, moderately distended, no HSM. No bruits or masses. +BS. Varicose veins.  Skin: Intact without lesions or rashes.  Neurologic: Alert and oriented x 3.  Psych: Normal affect. Extremities: No clubbing or cyanosis.   Assessment/Plan: 1. Chronic diastolic CHF:  EF 0000000 (02/2015).  NYHA class III symptoms.  - Volume status remains stable with semi-regular paracentesis.  - Doing better on fluid intake, but still drinking ~ 140 oz daily. Again stressed importance of cutting back.  - Continue torsemide 120 qam/100 qpm. Recent BMET stable.  - Continue K 100 meq TID with extra 40 meq on metolazone days.  - Continue Metolazone Monday/Wednesday/Saturday.  - May need regular US-guided paracentesis. Can schedule as needed 2. Chronic atrial fibrillation:  CHADSVASC 4 - age > 68, CHF, HTN, DM2. 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  - s/p AV node ablation with dual chamber pacing with difficult rate control and poor outcomes  with anticoagulation.  - Intolerant to ASA and coumadin as above, so no Watchman pursued. 3. CKD stage III:  - Stable on recent rechecks. Will check at next visit. Or with symptom changes.  4. Bioprosthetic AVR: 8/16 echo showed stable bioprosthetic aortic valve.   5. CAD: s/p CABG.  - No chest pain.   - Continue atorvastatin 20 mg daily and Toprol XL 100 mg BID.  - Off ASA and anticoag with history of GI bleed.  6. Cirrhosis:  - NASH-related.Follows with Dr. Laural Golden. - S/p US Paracentesis with > 8.4 L removed. Can repeat as needed.  - Continue spironolactone 25 mg BID.   7. Gout:  - Stable. Per PCP.     8. BPH: - Improved with flomax and paracentesis. 9. Morbid Obesity - Important for him to lose weight. Encouraged to increase activity as able.   Doing much better with increased diuretics from several visits and prn US paracentesis.  Continue current regimen. Follow up 6-8 weeks. Knows to call sooner to with symptoms.    Shirley Friar, PA-C 03/31/2016

## 2016-03-31 NOTE — Patient Instructions (Signed)
Your physician recommends that you schedule a follow-up appointment in: 6 weeks with Oda Kilts, PA  Do the following things EVERYDAY: 1) Weigh yourself in the morning before breakfast. Write it down and keep it in a log. 2) Take your medicines as prescribed 3) Eat low salt foods-Limit salt (sodium) to 2000 mg per day.  4) Stay as active as you can everyday 5) Limit all fluids for the day to less than 2 liters

## 2016-04-04 NOTE — Addendum Note (Signed)
Encounter addended by: Shirley Friar, PA-C on: 04/04/2016  1:36 PM<BR>    Actions taken: LOS modified, Follow-up modified

## 2016-04-11 ENCOUNTER — Ambulatory Visit (INDEPENDENT_AMBULATORY_CARE_PROVIDER_SITE_OTHER): Payer: Medicare Other | Admitting: *Deleted

## 2016-04-11 DIAGNOSIS — I442 Atrioventricular block, complete: Secondary | ICD-10-CM

## 2016-04-11 NOTE — Progress Notes (Signed)
Remote pacemaker transmission.   

## 2016-04-13 ENCOUNTER — Encounter: Payer: Self-pay | Admitting: Cardiology

## 2016-04-18 ENCOUNTER — Other Ambulatory Visit (HOSPITAL_COMMUNITY): Payer: Self-pay | Admitting: Adult Health

## 2016-04-25 ENCOUNTER — Other Ambulatory Visit (HOSPITAL_COMMUNITY): Payer: Self-pay | Admitting: Cardiology

## 2016-04-27 ENCOUNTER — Telehealth (HOSPITAL_COMMUNITY): Payer: Self-pay | Admitting: Cardiology

## 2016-04-27 DIAGNOSIS — R188 Other ascites: Secondary | ICD-10-CM

## 2016-04-27 NOTE — Telephone Encounter (Signed)
Pt called to request a paracentesis be arranged.  As documented, - May need regular US-guided paracentesis. Can schedule as needed, per Oda Kilts, PA   Order placed and Will arrange at [facility] as requested  04/28/16 @ 1- [facility]  Pt aware

## 2016-04-28 ENCOUNTER — Encounter (HOSPITAL_COMMUNITY): Payer: Self-pay

## 2016-04-28 ENCOUNTER — Ambulatory Visit (HOSPITAL_COMMUNITY)
Admission: RE | Admit: 2016-04-28 | Discharge: 2016-04-28 | Disposition: A | Discharge status: Home / Self Care | Payer: Medicare Other | Source: Ambulatory Visit | Attending: Student | Admitting: Student

## 2016-04-28 DIAGNOSIS — R188 Other ascites: Secondary | ICD-10-CM | POA: Diagnosis not present

## 2016-04-28 NOTE — Progress Notes (Signed)
6600 mL of clear/brown fluid obtained from paracentesis procedure. PT tolerated procedure well and NAD noted.

## 2016-05-06 LAB — CUP PACEART REMOTE DEVICE CHECK
Battery Remaining Longevity: 138 mo
Battery Remaining Percentage: 95.5 %
Brady Statistic RV Percent Paced: 99 %
Lead Channel Setting Pacing Amplitude: 0.875
Lead Channel Setting Sensing Sensitivity: 6 mV
MDC IDC LEAD IMPLANT DT: 20150831
MDC IDC LEAD LOCATION: 753860
MDC IDC LEAD MODEL: 1948
MDC IDC MSMT BATTERY VOLTAGE: 3.01 V
MDC IDC MSMT LEADCHNL RV IMPEDANCE VALUE: 710 Ohm
MDC IDC MSMT LEADCHNL RV PACING THRESHOLD AMPLITUDE: 0.625 V
MDC IDC MSMT LEADCHNL RV PACING THRESHOLD PULSEWIDTH: 0.4 ms
MDC IDC MSMT LEADCHNL RV SENSING INTR AMPL: 12 mV
MDC IDC PG SERIAL: 3014543
MDC IDC SESS DTM: 20170918081246
MDC IDC SET LEADCHNL RV PACING PULSEWIDTH: 0.4 ms

## 2016-05-12 ENCOUNTER — Encounter (HOSPITAL_COMMUNITY): Payer: Self-pay

## 2016-05-12 ENCOUNTER — Ambulatory Visit (HOSPITAL_COMMUNITY)
Admission: RE | Admit: 2016-05-12 | Discharge: 2016-05-12 | Disposition: A | Discharge status: Home / Self Care | Payer: Medicare Other | Source: Ambulatory Visit | Attending: Cardiology | Admitting: Cardiology

## 2016-05-12 VITALS — BP 128/70 | HR 94 | Wt 282.8 lb

## 2016-05-12 DIAGNOSIS — E1122 Type 2 diabetes mellitus with diabetic chronic kidney disease: Secondary | ICD-10-CM | POA: Insufficient documentation

## 2016-05-12 DIAGNOSIS — I25810 Atherosclerosis of coronary artery bypass graft(s) without angina pectoris: Secondary | ICD-10-CM | POA: Diagnosis not present

## 2016-05-12 DIAGNOSIS — N183 Chronic kidney disease, stage 3 unspecified: Secondary | ICD-10-CM

## 2016-05-12 DIAGNOSIS — I482 Chronic atrial fibrillation: Secondary | ICD-10-CM | POA: Diagnosis not present

## 2016-05-12 DIAGNOSIS — E663 Overweight: Secondary | ICD-10-CM

## 2016-05-12 DIAGNOSIS — I350 Nonrheumatic aortic (valve) stenosis: Secondary | ICD-10-CM

## 2016-05-12 DIAGNOSIS — I2510 Atherosclerotic heart disease of native coronary artery without angina pectoris: Secondary | ICD-10-CM | POA: Insufficient documentation

## 2016-05-12 DIAGNOSIS — Z9889 Other specified postprocedural states: Secondary | ICD-10-CM

## 2016-05-12 DIAGNOSIS — R188 Other ascites: Secondary | ICD-10-CM

## 2016-05-12 DIAGNOSIS — K7460 Unspecified cirrhosis of liver: Secondary | ICD-10-CM | POA: Diagnosis not present

## 2016-05-12 DIAGNOSIS — I10 Essential (primary) hypertension: Secondary | ICD-10-CM

## 2016-05-12 DIAGNOSIS — Z953 Presence of xenogenic heart valve: Secondary | ICD-10-CM | POA: Diagnosis not present

## 2016-05-12 DIAGNOSIS — I5032 Chronic diastolic (congestive) heart failure: Secondary | ICD-10-CM | POA: Diagnosis present

## 2016-05-12 DIAGNOSIS — M109 Gout, unspecified: Secondary | ICD-10-CM | POA: Diagnosis not present

## 2016-05-12 DIAGNOSIS — I130 Hypertensive heart and chronic kidney disease with heart failure and stage 1 through stage 4 chronic kidney disease, or unspecified chronic kidney disease: Secondary | ICD-10-CM | POA: Diagnosis not present

## 2016-05-12 DIAGNOSIS — E785 Hyperlipidemia, unspecified: Secondary | ICD-10-CM

## 2016-05-12 DIAGNOSIS — N400 Enlarged prostate without lower urinary tract symptoms: Secondary | ICD-10-CM | POA: Diagnosis not present

## 2016-05-12 DIAGNOSIS — I4821 Permanent atrial fibrillation: Secondary | ICD-10-CM

## 2016-05-12 LAB — COMPREHENSIVE METABOLIC PANEL
ALT: 16 U/L — AB (ref 17–63)
ANION GAP: 10 (ref 5–15)
AST: 31 U/L (ref 15–41)
Albumin: 3.6 g/dL (ref 3.5–5.0)
Alkaline Phosphatase: 74 U/L (ref 38–126)
BUN: 50 mg/dL — ABNORMAL HIGH (ref 6–20)
CALCIUM: 9.3 mg/dL (ref 8.9–10.3)
CHLORIDE: 97 mmol/L — AB (ref 101–111)
CO2: 29 mmol/L (ref 22–32)
CREATININE: 1.87 mg/dL — AB (ref 0.61–1.24)
GFR, EST AFRICAN AMERICAN: 41 mL/min — AB (ref >60)
GFR, EST NON AFRICAN AMERICAN: 35 mL/min — AB (ref >60)
Glucose, Bld: 160 mg/dL — ABNORMAL HIGH (ref 65–99)
Potassium: 4.1 mmol/L (ref 3.5–5.1)
SODIUM: 136 mmol/L (ref 135–145)
Total Bilirubin: 0.7 mg/dL (ref 0.3–1.2)
Total Protein: 7.1 g/dL (ref 6.5–8.1)

## 2016-05-12 LAB — BRAIN NATRIURETIC PEPTIDE: B NATRIURETIC PEPTIDE 5: 125.9 pg/mL — AB (ref 0.0–100.0)

## 2016-05-12 NOTE — Progress Notes (Signed)
Patient ID: [name], male   DOB: 1946-01-05, 70 y.o.   MRN: [id number]    Advanced Heart Failure Clinic Note   GI: Dr Laural Golden- followed for cirrhosis Pulmonary: Dr Melvyn Novas  PCP: Marjean Donna [facility])  Cardiology: Dr. Aundra Dubin  Mr Crepeau is a 70 yo with history of CAD s/p CABG, permanent atrial fibrillation, chronic diastolic CHF, severe AS s/p bioprosthetic AVR who presents for evaluation of diastolic CHF.  Last LHC in 8/13 showed patent grafts.  AVR was in 10/13.  Last echo in 8/16 showed EF 55-60%, normal bioprosthetic aortic valve.   July 2015:  Admitted to the hospital for GI bleed requiring 2 PRBCs. No additional GI work-up with recent EGD showing gastritis.  He had an AV nodal ablation in 9/15 with significant improvement after this.  He has St Jude PPM.   In 6/17, had right knee operation.    He presents today for regular follow up.  S/p US paracentesis 04/28/16 with 6.6 L out.  Continues to drink > 120 oz a day, wife not here today who keeps strict list.  Weight came down 14 lbs or so after paracentesis and has slowly built back up over past 2 weeks. DOE stable after 150-200 when his weight is up, slightly further when his weight is down.  Denies CP.  Sleeps in adjustable bed with head raised.   Labs (8/14) LDL 38 Labs (3/15) K 3.4, creatinine 1.7, BUN 55  Labs (10/15/13) K 4.0 Creatinine 1.6 BUN 32 Labs (10/29/13) K 4.0 Creatinine 1.6  Labs (4/15) K 4.2 Creatinine 1.32 => 1.5, HCT 35.2 Labs (12/11/13) K 3.6 Creatinine 1.56 Pro BNP 1152 Hemoglobin 9.6  Labs (01/12/14) K 3.0, creatinine 2.0, hemoglobin 8.8 Labs (01/31/14) K 3.3, creatinine 1.49 Labs (8/15) K 3.9, creatinine 1.89 Labs (9/15): K 3.7, creatinine 1.6, AST 48, ALT 52, TSH normal Labs (10/15): K 3.5, creatinine 1.58, BNP 780 Labs (3/16): K 3.5, creatinine 1.6 Labs 12/03/2014: K 4.4 Creatinine 1.55 Labs 12/12/2014: K 3.7 Creatinine 1.48, LDL 50 Labs 8/16: K 3.8, creatinine 2.42 Labs 04/07/2015: K 4.3, Creatinine  2.22, BNP 169 Labs (11/16): K 4, creatinine 1.78 => 1.55, BNP 120 Labs 11/11/2015 K 3.7 Creatinine 1.53  Labs (6/17): K 3.8, creatinine 1.52  PMH: 1. CAD: s/p CABG 2004. Last LHC (8/13) with patent free radial-OM, patent SVG-D, patent LIMA-LAD.  2. Permanent atrial fibrillation: Not anticoagulated due to GI bleeding.  Holter monitor (4/15) with mean HR 115 (afib).   He had AV nodal ablation with St Jude dual chamber PPM  3. Chronic diastolic CHF: Echo (123456) with EF 55-60%, bioprosthetic aortic valve with mean gradient 22 mmHg, mildly decreased RV systolic function. RHC (12/2013): RA 14, RV 44/4/11, PA 50/23 (33), PCWP 20, v = 35, Fick CO/CI: 6.0/2.6, PVR 2.2 WU, PA 59%.  Echo (8/15) with EF 55-60%, mild LVH, bioprosthetic aortic valve with mean gradient 16 mmHg, PA systolic pressure 46 mmHg, mild to moderate MR.  Echo (8/16) with EF 55-60%, normal bioprosthetic aortic valve, mild MR, PASP 44 mmHg.  - Gynecomastia with spironolactone.  4. Severe aortic stenosis s/p bioprosthetic AVR in 10/13. Mean gradient 22 mmHg across valve on echo in 8/14.  Mean gradient 16 mmHg on echo in 8/15.  5. NASH with cirrhosis: ascites, thrombocytopenia.  CT abdomen in 6/17 showed cirrhosis and moderate ascites. Had paracentesis in 1/15 and in 6/17.  6. 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  EGD in 1/15 showed mild portal gastropathy and 2  small antral ulcers. Recurrent GI bleed in 6/15, EGD showed gastritis and ?GAVE.  7. Hyperlipidemia 8. HTN 9. GERD 10. COPD 29. OSA: Cannot tolerate CPAP.  12. 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  14. Carotid dopplers (11/15) with minimal stenosis.  15. ABI 12/11/2014: normal    SH: Lives with wife in [place], prior smoker.   FH: CAD  ROS: All systems reviewed and negative except as per HPI.   Current Outpatient Prescriptions on File Prior to Encounter  Medication Sig Dispense Refill  . acetaminophen (TYLENOL) 500 MG tablet Take 2,000 mg by mouth at bedtime as needed for mild  pain or headache.    . allopurinol (ZYLOPRIM) 100 MG tablet Take 1 tablet (100 mg total) by mouth daily. TAKE 1 TABLET (100 MG TOTAL) BY MOUTH DAILY. 30 tablet 6  . atorvastatin (LIPITOR) 20 MG tablet Take 20 mg by mouth daily at 6 PM.     . benzonatate (TESSALON) 200 MG capsule Take 1 capsule (200 mg total) by mouth 3 (three) times daily as needed for cough. 100 capsule 1  . colchicine 0.6 MG tablet Take 0.6 mg by mouth daily.     . ferrous gluconate (FERGON) 240 (27 FE) MG tablet Take 240 mg by mouth 2 (two) times daily.    [name] gabapentin (NEURONTIN) 300 MG capsule Take 300 mg by mouth 3 (three) times daily.  6  . insulin NPH Human (HUMULIN N,NOVOLIN N) 100 UNIT/ML injection Inject 70 Units into the skin 2 (two) times daily before a meal.     . insulin regular (NOVOLIN R,HUMULIN R) 100 units/mL injection Inject 30 Units into the skin 2 (two) times daily before a meal.     . methocarbamol (ROBAXIN) 500 MG tablet Take 1 tablet (500 mg total) by mouth 4 (four) times daily. As needed for muscle spasm 30 tablet 1  . metolazone (ZAROXOLYN) 2.5 MG tablet TAKE 1 TABLET BY MOUTH 3 TIMES A WEEK ON MON/WED/SAT 15 tablet 6  . metoprolol succinate (TOPROL-XL) 100 MG 24 hr tablet TAKE 1 TABLET (100 MG TOTAL) BY MOUTH 2 (TWO) TIMES DAILY. 60 tablet 6  . Multiple Vitamins-Minerals (CVS SPECTRAVITE ADULT 50+ PO) Take 1 tablet by mouth daily.    [name] omeprazole (PRILOSEC) 20 MG capsule Take 1 capsule (20 mg total) by mouth 2 (two) times daily before a meal. 30 capsule 5  . potassium chloride SA (K-DUR,KLOR-CON) 20 MEQ tablet Take 4 tablets (80 mEq total) by mouth 4 (four) times daily. Take an extra 2 tablets on M,W,Sa with metolazone 500 tablet 6  . sertraline (ZOLOFT) 50 MG tablet Take 50 mg by mouth daily.    [name] spironolactone (ALDACTONE) 25 MG tablet Take 1 tablet (25 mg total) by mouth 2 (two) times daily. 60 tablet 5  . tamsulosin (FLOMAX) 0.4 MG CAPS capsule Take 1 capsule (0.4 mg total) by mouth daily after  supper. 30 capsule 3  . torsemide (DEMADEX) 20 MG tablet Take 6 tabs in AM and 5 tabs in PM 330 tablet 3  . vitamin C (ASCORBIC ACID) 500 MG tablet Take 500 mg by mouth 2 (two) times daily.     No current facility-administered medications on file prior to encounter.     Vitals:   05/12/16 1001  BP: 128/70  BP Location: Left Arm  Patient Position: Sitting  Cuff Size: Normal  Pulse: 94  SpO2: 98%  Weight: 282 lb 12.8 oz (128.3 kg)     Wt Readings from Last 3 Encounters:  05/12/16 282 lb 12.8 oz (128.3 kg)  04/28/16 287 lb 3.2 oz (130.3 kg)  03/31/16 271 lb 6.4 oz (123.1 kg)     General: NAD. Ambulated in the clinic without difficulty.  Neck: JVP ~9-10 cm with HJR, no thyromegaly or thyroid nodule.  Lungs: CTAB, normal effort. CV: Nondisplaced PMI.  Irregularly irregular, 2/6 early SEM RUSB.  Trace ankle edema. Left carotid bruit.  1+ PT pulses bilaterally.  Abdomen: Soft, obese, NT, markedly distended, no HSM. No bruits or masses. +BS. Varicose veins.  Skin: Intact without lesions or rashes.  Neurologic: Alert and oriented x 3.  Psych: Normal affect. Extremities: No clubbing or cyanosis.   Assessment/Plan: 1. Chronic diastolic CHF:  EF 0000000 (02/2015).  NYHA class III symptoms.  - Volume status again elevated with continue marked fluid intake in excess of 140 oz daily. Have again encouraged to cut back.  - Continue torsemide 120 qam/100 qpm. CMET/BNP today.  - Continue K 100 meq TID with extra 40 meq on metolazone days.  - Continue Metolazone Monday/Wednesday/Saturday.  - Will schedule US paracentesis. Dr. Olevia Perches office has talked to patient about getting a standing order placed.  He is going to call them back and get this set up.  2. Chronic atrial fibrillation:  CHADSVASC 4 - age > 54, CHF, HTN, DM2. 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  - s/p AV node ablation with dual chamber pacing with difficult rate control and  poor outcomes with anticoagulation.  - Intolerant to ASA and coumadin as above, so no Watchman pursued. 3. CKD stage III:  - CMET today.  4. Bioprosthetic AVR: 8/16 echo showed stable bioprosthetic aortic valve.   5. CAD: s/p CABG.  - No chest pain.   - Continue atorvastatin 20 mg daily and Toprol XL 100 mg BID.  - Off ASA and anticoag with history of GI bleed.  6. Cirrhosis:  - NASH-related.Follows with Dr. Laural Golden.  - CMET today.  - S/p US Paracentesis with 6.6 L off. Now requiring regular paracenteses. He is to talk to Dr. Laural Golden about having standing order placed.   - Continue spironolactone 25 mg BID.   7. Gout:  - Stable. Per PCP.     8. BPH: - Improved with flomax and paracentesis. 9. Morbid Obesity - Important for him to lose weight. Encouraged to increase activity as able.   Continue high dose torsemide without change. Order paracentesis. Will work with Dr Otelia Limes office to schedule standing order for ongoing paracenteses as needed. Labs today.   Follow up 2 months with Dr. Aundra Dubin.   Shirley Friar, PA-C 05/12/2016

## 2016-05-12 NOTE — Patient Instructions (Signed)
Routine lab work today. Will notify you of abnormal results, otherwise no news is good news!  Will schedule you for ultrasound guided paracentesis at [facility]. Address: [address], [place],  [address] Phone: [phone]  ____________________________________________________________  ____________________________________________________________  Follow up 2 months with Dr. Aundra Dubin.  Do the following things EVERYDAY: 1) Weigh yourself in the morning before breakfast. Write it down and keep it in a log. 2) Take your medicines as prescribed 3) Eat low salt foods-Limit salt (sodium) to 2000 mg per day.  4) Stay as active as you can everyday 5) Limit all fluids for the day to less than 2 liters

## 2016-05-16 ENCOUNTER — Ambulatory Visit (HOSPITAL_COMMUNITY): Payer: Medicare Other

## 2016-05-16 ENCOUNTER — Ambulatory Visit (HOSPITAL_COMMUNITY)
Admission: RE | Admit: 2016-05-16 | Discharge: 2016-05-16 | Disposition: A | Discharge status: Home / Self Care | Payer: Medicare Other | Source: Ambulatory Visit | Attending: Student | Admitting: Student

## 2016-05-16 DIAGNOSIS — R188 Other ascites: Secondary | ICD-10-CM | POA: Diagnosis not present

## 2016-05-16 NOTE — Progress Notes (Signed)
Paracentesis complete no signs of distress. 8000 ml yellow colored ascites removed.

## 2016-05-23 DIAGNOSIS — H16143 Punctate keratitis, bilateral: Secondary | ICD-10-CM | POA: Diagnosis not present

## 2016-05-23 DIAGNOSIS — E119 Type 2 diabetes mellitus without complications: Secondary | ICD-10-CM | POA: Diagnosis not present

## 2016-05-23 DIAGNOSIS — Z794 Long term (current) use of insulin: Secondary | ICD-10-CM | POA: Diagnosis not present

## 2016-05-30 DIAGNOSIS — R05 Cough: Secondary | ICD-10-CM | POA: Diagnosis not present

## 2016-06-01 ENCOUNTER — Telehealth (HOSPITAL_COMMUNITY): Payer: Self-pay | Admitting: *Deleted

## 2016-06-01 DIAGNOSIS — R188 Other ascites: Secondary | ICD-10-CM

## 2016-06-01 NOTE — Telephone Encounter (Signed)
Pt called requesting a paracentesis, he states his abd is swollen again.  Per last OV note pt can have them done as needed.    Paracentesis sch for tomorrow at 9am at [facility], pt aware to arrive at 8:30

## 2016-06-02 ENCOUNTER — Ambulatory Visit (HOSPITAL_COMMUNITY)
Admission: RE | Admit: 2016-06-02 | Discharge: 2016-06-02 | Disposition: A | Discharge status: Home / Self Care | Payer: Medicare Other | Source: Ambulatory Visit | Attending: Student | Admitting: Student

## 2016-06-02 ENCOUNTER — Encounter (HOSPITAL_COMMUNITY): Payer: Self-pay

## 2016-06-02 DIAGNOSIS — R188 Other ascites: Secondary | ICD-10-CM | POA: Insufficient documentation

## 2016-06-02 NOTE — Progress Notes (Signed)
Paracentesis complete no signs of distress.  

## 2016-06-14 ENCOUNTER — Other Ambulatory Visit (HOSPITAL_COMMUNITY): Payer: Self-pay | Admitting: *Deleted

## 2016-06-14 DIAGNOSIS — R188 Other ascites: Secondary | ICD-10-CM

## 2016-06-15 ENCOUNTER — Ambulatory Visit (HOSPITAL_COMMUNITY)
Admission: RE | Admit: 2016-06-15 | Discharge: 2016-06-15 | Disposition: A | Discharge status: Home / Self Care | Payer: Medicare Other | Source: Ambulatory Visit | Attending: Cardiology | Admitting: Cardiology

## 2016-06-15 ENCOUNTER — Encounter: Payer: Self-pay | Admitting: Cardiovascular Disease

## 2016-06-15 DIAGNOSIS — R188 Other ascites: Secondary | ICD-10-CM | POA: Diagnosis present

## 2016-06-15 NOTE — Procedures (Signed)
PreOperative Dx: Cirrhosis, ascites Postoperative Dx: Cirrhosis, ascites Procedure:   US guided paracentesis Radiologist:  Thornton Papas Anesthesia:  10 ml of1% lidocaine Specimen:  6.4 L of amber colored ascitic fluid EBL:   < 1 ml Complications: None

## 2016-06-15 NOTE — Progress Notes (Signed)
Paracentesis complete no signs of distress 6400 ml amber colored ascites removed.

## 2016-06-20 ENCOUNTER — Other Ambulatory Visit (HOSPITAL_COMMUNITY): Payer: Self-pay | Admitting: Cardiology

## 2016-06-21 ENCOUNTER — Encounter (INDEPENDENT_AMBULATORY_CARE_PROVIDER_SITE_OTHER): Payer: Self-pay | Admitting: *Deleted

## 2016-06-21 ENCOUNTER — Encounter (INDEPENDENT_AMBULATORY_CARE_PROVIDER_SITE_OTHER): Payer: Self-pay | Admitting: Internal Medicine

## 2016-06-21 ENCOUNTER — Ambulatory Visit (INDEPENDENT_AMBULATORY_CARE_PROVIDER_SITE_OTHER): Payer: Medicare Other | Admitting: Internal Medicine

## 2016-06-21 VITALS — BP 118/72 | HR 65 | Temp 97.4°F | Resp 18 | Ht 68.0 in | Wt 280.9 lb

## 2016-06-21 DIAGNOSIS — I25812 Atherosclerosis of bypass graft of coronary artery of transplanted heart without angina pectoris: Secondary | ICD-10-CM

## 2016-06-21 DIAGNOSIS — R188 Other ascites: Secondary | ICD-10-CM

## 2016-06-21 DIAGNOSIS — D500 Iron deficiency anemia secondary to blood loss (chronic): Secondary | ICD-10-CM

## 2016-06-21 DIAGNOSIS — K7460 Unspecified cirrhosis of liver: Secondary | ICD-10-CM | POA: Diagnosis not present

## 2016-06-21 NOTE — Patient Instructions (Signed)
Physician will call with results of blood test ultrasound when completed.

## 2016-06-21 NOTE — Progress Notes (Signed)
Presenting complaint;  Follow-up for chronic liver disease and history of iron deficiency anemia.  Subjective:  Patient is 70 year old Caucasian male who is here for scheduled visit. He was last seen on 12/15/2015 when he weighed 271 pounds. He was 280 pounds today. He had abdominal to have on 06/15/2016 with removal of 6.4 liters of fluid. He had a tap in June, July and August. He had tapped twice in October and November this year. Asian reports gradual increase in abdominal size since his last tab. He believes he has gained 6 or 7 pounds since then. He states he has been told he is drinking too much water but he does not believe so. He is watching salt intake. He has good appetite but watches food intake. He had a bout with diarrhea last week when he noted blood and that the tissue. He did not experience frank rectal bleeding. His stools are dark because he is on iron. He denies abdominal pain nausea or vomiting. He feels he may be having more difficulty in breathing with exertion than before but he is not able to walk much. He sleeps with head end of bed elevated by about 25 degrees.   Current Medications: Outpatient Encounter Prescriptions as of 06/21/2016  Medication Sig  . acetaminophen (TYLENOL) 500 MG tablet Take 2,000 mg by mouth at bedtime as needed for mild pain or headache.  . allopurinol (ZYLOPRIM) 100 MG tablet Take 1 tablet (100 mg total) by mouth daily. TAKE 1 TABLET (100 MG TOTAL) BY MOUTH DAILY.  [name] atorvastatin (LIPITOR) 20 MG tablet Take 20 mg by mouth daily at 6 PM.   . benzonatate (TESSALON) 200 MG capsule Take 1 capsule (200 mg total) by mouth 3 (three) times daily as needed for cough.  . colchicine 0.6 MG tablet Take 0.6 mg by mouth daily.   . ferrous gluconate (FERGON) 240 (27 FE) MG tablet Take 240 mg by mouth 2 (two) times daily.  [name] gabapentin (NEURONTIN) 300 MG capsule Take 300 mg by mouth 3 (three) times daily.  . insulin NPH Human (HUMULIN N,NOVOLIN N) 100 UNIT/ML  injection Inject 70 Units into the skin 2 (two) times daily before a meal.   . insulin regular (NOVOLIN R,HUMULIN R) 100 units/mL injection Inject 30 Units into the skin 2 (two) times daily before a meal.   . methocarbamol (ROBAXIN) 500 MG tablet Take 1 tablet (500 mg total) by mouth 4 (four) times daily. As needed for muscle spasm  . metolazone (ZAROXOLYN) 2.5 MG tablet TAKE 1 TABLET BY MOUTH 3 TIMES A WEEK ON MON/WED/SAT  . metoprolol succinate (TOPROL-XL) 100 MG 24 hr tablet TAKE 1 TABLET (100 MG TOTAL) BY MOUTH 2 (TWO) TIMES DAILY.  . Multiple Vitamins-Minerals ([employer] SPECTRAVITE ADULT 50+ PO) Take 1 tablet by mouth daily.  [name] omeprazole (PRILOSEC) 20 MG capsule Take 1 capsule (20 mg total) by mouth 2 (two) times daily before a meal.  . potassium chloride SA (K-DUR,KLOR-CON) 20 MEQ tablet Take 4 tablets (80 mEq total) by mouth 4 (four) times daily. Take an extra 2 tablets on M,W,Sa with metolazone  . sertraline (ZOLOFT) 50 MG tablet Take 50 mg by mouth daily.  [name] spironolactone (ALDACTONE) 25 MG tablet Take 1 tablet (25 mg total) by mouth 2 (two) times daily.  . tamsulosin (FLOMAX) 0.4 MG CAPS capsule TAKE 1 CAPSULE (0.4 MG TOTAL) BY MOUTH DAILY AFTER SUPPER.  [name] torsemide (DEMADEX) 20 MG tablet Take 6 tabs in AM and 5 tabs in PM  .  vitamin C (ASCORBIC ACID) 500 MG tablet Take 500 mg by mouth 2 (two) times daily.   No facility-administered encounter medications on file as of 06/21/2016.      Objective: Blood pressure 118/72, pulse 65, temperature 97.4 F (36.3 C), temperature source Oral, resp. rate 18, height 5\' 8"  (1.727 m), weight 280 lb 14.4 oz (127.4 kg). Patient is alert and in no acute distress. He does not have asterixis. Conjunctiva is pink. Sclera is nonicteric Oropharyngeal mucosa is normal. No neck masses or thyromegaly noted. Cardiac exam with regular rhythm normal S1 and S2. Grade 2 over systolic ejection murmur best heard at left sternal border. Lungs are clear to  auscultation. Abdomen is protuberant but soft and nontender. Flanks are bulging. Liver spleen cannot be palpated or balloted. No LE edema or clubbing noted.  Labs/studies Results: Lab data from 05/12/2016 Serum sodium 136, potassium 4.1, chloride 97, CO2 29 Glucose 160 BUN 50 and creatinine 1.87 Bilirubin 0.7, AP 74, AST 31, ALT 16, total protein 7.1 and albumin 3.6 Serum calcium 9.3.  AFP was 2.1 on 12/15/2015  Last imaging was CT abdomen and pelvis without contrast on 01/13/2016 and did not reveal focal abnormalities involving cirrhotic liver.  Assessment:  #1. Ascites. Patient has required 7 LVAPs in last 5 months. In the preceding 7 months he only needed tap once. Serum albumin 5 months ago was 3.8 and five weeks ago it was 3.6. Therefore I wonder if cardiac/valvular disease is playing a role in ascites. Will check with Dr. Aundra Dubin to consider cardiac evaluation/ECHO. He is requiring large dose of oral potassium while on loop diuretic. If renal function is stable spironolactone dose could be increased and potassium dose could be reduced. Will check electrolytes before making further recommendations. #2. Cirrhosis secondary to NASH. Last EGD in June 2015 was negative for varices. If hepatic function has indeed deteriorated he should consider EGD. #3. History of iron deficiency anemia secondary to GI blood loss. Hemoglobin has been gradually coming up. #4. History of colonic polyps. He is due for colonoscopy.   Plan:  Will schedule patient for right upper quadrant ultrasound primarily for Margaret screening. Patient will go the lab for CBC, metabolic 7 and AFP. Further recommendations to follow. Will consider EGD and colonoscopy when he is ready. Office visit in 6 months.

## 2016-06-28 ENCOUNTER — Ambulatory Visit (HOSPITAL_COMMUNITY)
Admission: RE | Admit: 2016-06-28 | Discharge: 2016-06-28 | Disposition: A | Discharge status: Home / Self Care | Payer: Medicare Other | Source: Ambulatory Visit | Attending: Internal Medicine | Admitting: Internal Medicine

## 2016-06-28 ENCOUNTER — Other Ambulatory Visit (INDEPENDENT_AMBULATORY_CARE_PROVIDER_SITE_OTHER): Payer: Self-pay | Admitting: Internal Medicine

## 2016-06-28 ENCOUNTER — Encounter (HOSPITAL_COMMUNITY): Payer: Self-pay

## 2016-06-28 DIAGNOSIS — K8020 Calculus of gallbladder without cholecystitis without obstruction: Secondary | ICD-10-CM | POA: Insufficient documentation

## 2016-06-28 DIAGNOSIS — R188 Other ascites: Secondary | ICD-10-CM | POA: Diagnosis not present

## 2016-06-28 DIAGNOSIS — K7460 Unspecified cirrhosis of liver: Secondary | ICD-10-CM

## 2016-06-28 DIAGNOSIS — D500 Iron deficiency anemia secondary to blood loss (chronic): Secondary | ICD-10-CM | POA: Diagnosis not present

## 2016-06-28 LAB — CBC
HCT: 35.5 % — ABNORMAL LOW (ref 38.5–50.0)
Hemoglobin: 10.7 g/dL — ABNORMAL LOW (ref 13.2–17.1)
MCH: 25.8 pg — AB (ref 27.0–33.0)
MCHC: 30.1 g/dL — ABNORMAL LOW (ref 32.0–36.0)
MCV: 85.5 fL (ref 80.0–100.0)
MPV: 10.7 fL (ref 7.5–12.5)
PLATELETS: 164 10*3/uL (ref 140–400)
RBC: 4.15 MIL/uL — AB (ref 4.20–5.80)
RDW: 17.9 % — AB (ref 11.0–15.0)
WBC: 6.4 10*3/uL (ref 3.8–10.8)

## 2016-06-28 LAB — BASIC METABOLIC PANEL
BUN: 47 mg/dL — ABNORMAL HIGH (ref 7–25)
CALCIUM: 8.6 mg/dL (ref 8.6–10.3)
CHLORIDE: 99 mmol/L (ref 98–110)
CO2: 26 mmol/L (ref 20–31)
Creat: 1.95 mg/dL — ABNORMAL HIGH (ref 0.70–1.25)
Glucose, Bld: 108 mg/dL — ABNORMAL HIGH (ref 65–99)
Potassium: 3.5 mmol/L (ref 3.5–5.3)
SODIUM: 138 mmol/L (ref 135–146)

## 2016-06-28 MED ORDER — ALBUMIN HUMAN 25 % IV SOLN
50.0000 g | Freq: Once | INTRAVENOUS | Status: AC
  Administered 2016-06-28: 50 g via INTRAVENOUS

## 2016-06-28 MED ORDER — ALBUMIN HUMAN 25 % IV SOLN
INTRAVENOUS | Status: AC
  Administered 2016-06-28: 50 g via INTRAVENOUS
  Filled 2016-06-28: qty 200

## 2016-06-28 NOTE — Progress Notes (Signed)
Paracentesis complete no signs of distress. 7.5L amber colored ascites removed.

## 2016-06-29 LAB — AFP TUMOR MARKER: AFP-Tumor Marker: 1.7 ng/mL (ref <6.1)

## 2016-06-30 ENCOUNTER — Other Ambulatory Visit (INDEPENDENT_AMBULATORY_CARE_PROVIDER_SITE_OTHER): Payer: Self-pay | Admitting: *Deleted

## 2016-06-30 DIAGNOSIS — K7460 Unspecified cirrhosis of liver: Secondary | ICD-10-CM

## 2016-06-30 DIAGNOSIS — D649 Anemia, unspecified: Secondary | ICD-10-CM

## 2016-07-03 LAB — CULTURE, BODY FLUID-BOTTLE: CULTURE: NO GROWTH

## 2016-07-03 LAB — CULTURE, BODY FLUID W GRAM STAIN -BOTTLE

## 2016-07-07 ENCOUNTER — Encounter: Payer: Self-pay | Admitting: Cardiology

## 2016-07-07 DIAGNOSIS — Z23 Encounter for immunization: Secondary | ICD-10-CM | POA: Diagnosis not present

## 2016-07-07 DIAGNOSIS — E7800 Pure hypercholesterolemia, unspecified: Secondary | ICD-10-CM | POA: Diagnosis not present

## 2016-07-07 DIAGNOSIS — K7460 Unspecified cirrhosis of liver: Secondary | ICD-10-CM | POA: Diagnosis not present

## 2016-07-07 DIAGNOSIS — I482 Chronic atrial fibrillation: Secondary | ICD-10-CM | POA: Diagnosis not present

## 2016-07-07 DIAGNOSIS — Z952 Presence of prosthetic heart valve: Secondary | ICD-10-CM | POA: Diagnosis not present

## 2016-07-07 DIAGNOSIS — E1122 Type 2 diabetes mellitus with diabetic chronic kidney disease: Secondary | ICD-10-CM | POA: Diagnosis not present

## 2016-07-07 DIAGNOSIS — I10 Essential (primary) hypertension: Secondary | ICD-10-CM | POA: Diagnosis not present

## 2016-07-07 DIAGNOSIS — I2510 Atherosclerotic heart disease of native coronary artery without angina pectoris: Secondary | ICD-10-CM | POA: Diagnosis not present

## 2016-07-07 DIAGNOSIS — R1030 Lower abdominal pain, unspecified: Secondary | ICD-10-CM | POA: Diagnosis not present

## 2016-07-07 DIAGNOSIS — I350 Nonrheumatic aortic (valve) stenosis: Secondary | ICD-10-CM | POA: Diagnosis not present

## 2016-07-07 DIAGNOSIS — F329 Major depressive disorder, single episode, unspecified: Secondary | ICD-10-CM | POA: Diagnosis not present

## 2016-07-07 DIAGNOSIS — I5032 Chronic diastolic (congestive) heart failure: Secondary | ICD-10-CM | POA: Diagnosis not present

## 2016-07-11 ENCOUNTER — Telehealth (HOSPITAL_COMMUNITY): Payer: Self-pay | Admitting: *Deleted

## 2016-07-11 ENCOUNTER — Ambulatory Visit (INDEPENDENT_AMBULATORY_CARE_PROVIDER_SITE_OTHER): Payer: Medicare Other | Admitting: *Deleted

## 2016-07-11 ENCOUNTER — Other Ambulatory Visit (HOSPITAL_COMMUNITY): Payer: Self-pay | Admitting: Student

## 2016-07-11 DIAGNOSIS — I442 Atrioventricular block, complete: Secondary | ICD-10-CM

## 2016-07-11 DIAGNOSIS — R188 Other ascites: Secondary | ICD-10-CM

## 2016-07-11 NOTE — Progress Notes (Signed)
Remote pacemaker transmission.   

## 2016-07-11 NOTE — Telephone Encounter (Signed)
Patient called requesting for Paracentesis needed at [facility].  Procedure has been ordered and scheduled for this Wednesday 07/13/2016 at 9:45am.  Patient is aware of appointment.

## 2016-07-12 ENCOUNTER — Inpatient Hospital Stay (HOSPITAL_COMMUNITY): Admission: RE | Admit: 2016-07-12 | Payer: Medicare Other | Source: Ambulatory Visit

## 2016-07-12 ENCOUNTER — Telehealth: Payer: Self-pay | Admitting: Cardiology

## 2016-07-12 NOTE — Telephone Encounter (Signed)
Marlita advised pt followed in Heart Failure Clinic at [facility], she would need to contact Dr Aundra Dubin there, [phone].

## 2016-07-12 NOTE — Telephone Encounter (Signed)
arlita [facility] is calling to ask that Dr. Aundra Dubin sign the order in Epic for the paracentesis on tomorrow . Thanks

## 2016-07-13 ENCOUNTER — Encounter (HOSPITAL_COMMUNITY): Payer: Self-pay

## 2016-07-13 ENCOUNTER — Encounter: Payer: Self-pay | Admitting: Cardiology

## 2016-07-13 ENCOUNTER — Ambulatory Visit (HOSPITAL_COMMUNITY)
Admission: RE | Admit: 2016-07-13 | Discharge: 2016-07-13 | Disposition: A | Discharge status: Home / Self Care | Payer: Medicare Other | Source: Ambulatory Visit | Attending: Cardiology | Admitting: Cardiology

## 2016-07-13 DIAGNOSIS — R188 Other ascites: Secondary | ICD-10-CM | POA: Insufficient documentation

## 2016-07-13 LAB — CUP PACEART REMOTE DEVICE CHECK
Battery Remaining Longevity: 139 mo
Battery Remaining Percentage: 95.5 %
Brady Statistic RV Percent Paced: 99 %
Implantable Lead Model: 1948
Lead Channel Setting Pacing Amplitude: 0.75 V
Lead Channel Setting Sensing Sensitivity: 6 mV
MDC IDC LEAD IMPLANT DT: 20150831
MDC IDC LEAD LOCATION: 753860
MDC IDC MSMT BATTERY VOLTAGE: 3.01 V
MDC IDC MSMT LEADCHNL RV IMPEDANCE VALUE: 700 Ohm
MDC IDC MSMT LEADCHNL RV PACING THRESHOLD AMPLITUDE: 0.5 V
MDC IDC MSMT LEADCHNL RV PACING THRESHOLD PULSEWIDTH: 0.4 ms
MDC IDC MSMT LEADCHNL RV SENSING INTR AMPL: 12 mV
MDC IDC PG IMPLANT DT: 20150831
MDC IDC PG SERIAL: 3014543
MDC IDC SESS DTM: 20171218070013
MDC IDC SET LEADCHNL RV PACING PULSEWIDTH: 0.4 ms

## 2016-07-13 NOTE — Procedures (Signed)
PreOperative Dx: Cirrhosis, ascites Postoperative Dx: Cirrhosis, ascites Procedure:   US guided paracentesis Radiologist:  Thornton Papas Anesthesia:  10 ml of1% lidocaine Specimen:  8.8 L of amber ascitic fluid EBL:   < 1 ml Complications: None

## 2016-07-13 NOTE — Progress Notes (Signed)
Paracentesis complete no signs of distress. 8.8 L amber colored ascites removed.

## 2016-07-27 LAB — FUNGUS CULTURE WITH STAIN

## 2016-07-27 LAB — FUNGUS CULTURE RESULT

## 2016-07-27 LAB — FUNGAL ORGANISM REFLEX

## 2016-07-28 ENCOUNTER — Telehealth (HOSPITAL_COMMUNITY): Payer: Self-pay | Admitting: *Deleted

## 2016-07-28 DIAGNOSIS — R188 Other ascites: Secondary | ICD-10-CM

## 2016-07-28 NOTE — Telephone Encounter (Signed)
Patient requesting to be scheduled for a paracentesis at [facility]. Called Central Scheduling and they have him down for US Paracentesis tomorrow January 5th at 2:15.  Verbal order from Dr. Aundra Dubin placed in Epic and patient is aware of appointment. No further needs at this time.

## 2016-07-29 ENCOUNTER — Ambulatory Visit (HOSPITAL_COMMUNITY)
Admission: RE | Admit: 2016-07-29 | Discharge: 2016-07-29 | Disposition: A | Discharge status: Home / Self Care | Payer: Medicare Other | Source: Ambulatory Visit | Attending: Cardiology | Admitting: Cardiology

## 2016-07-29 DIAGNOSIS — R188 Other ascites: Secondary | ICD-10-CM | POA: Diagnosis not present

## 2016-07-29 DIAGNOSIS — R140 Abdominal distension (gaseous): Secondary | ICD-10-CM | POA: Insufficient documentation

## 2016-07-29 NOTE — Procedures (Signed)
PreOperative Dx: Cirrhosis, ascites Postoperative Dx: Cirrhosis, ascites Procedure:   US guided paracentesis Radiologist:  Talton Delpriore Anesthesia:  10 ml of1% lidocaine Specimen:  5 L of yellow ascitic fluid EBL:   < 1 ml Complications: None  

## 2016-07-29 NOTE — Progress Notes (Signed)
Paracentesis complete no signs of distress; 5L yellow colored ascites removed.  

## 2016-08-02 DIAGNOSIS — E1140 Type 2 diabetes mellitus with diabetic neuropathy, unspecified: Secondary | ICD-10-CM | POA: Diagnosis not present

## 2016-08-02 DIAGNOSIS — E1165 Type 2 diabetes mellitus with hyperglycemia: Secondary | ICD-10-CM | POA: Diagnosis not present

## 2016-08-02 DIAGNOSIS — I10 Essential (primary) hypertension: Secondary | ICD-10-CM | POA: Diagnosis not present

## 2016-08-09 ENCOUNTER — Encounter (HOSPITAL_COMMUNITY): Payer: Self-pay

## 2016-08-09 ENCOUNTER — Ambulatory Visit (HOSPITAL_COMMUNITY)
Admission: RE | Admit: 2016-08-09 | Discharge: 2016-08-09 | Disposition: A | Discharge status: Home / Self Care | Payer: Medicare Other | Source: Ambulatory Visit | Attending: Cardiology | Admitting: Cardiology

## 2016-08-09 VITALS — BP 107/58 | HR 78 | Wt 286.0 lb

## 2016-08-09 DIAGNOSIS — I5040 Unspecified combined systolic (congestive) and diastolic (congestive) heart failure: Secondary | ICD-10-CM | POA: Diagnosis not present

## 2016-08-09 DIAGNOSIS — I509 Heart failure, unspecified: Secondary | ICD-10-CM | POA: Diagnosis not present

## 2016-08-09 DIAGNOSIS — Z8249 Family history of ischemic heart disease and other diseases of the circulatory system: Secondary | ICD-10-CM | POA: Diagnosis not present

## 2016-08-09 DIAGNOSIS — R188 Other ascites: Secondary | ICD-10-CM | POA: Insufficient documentation

## 2016-08-09 DIAGNOSIS — E1122 Type 2 diabetes mellitus with diabetic chronic kidney disease: Secondary | ICD-10-CM | POA: Diagnosis not present

## 2016-08-09 DIAGNOSIS — Z87891 Personal history of nicotine dependence: Secondary | ICD-10-CM | POA: Diagnosis not present

## 2016-08-09 DIAGNOSIS — I350 Nonrheumatic aortic (valve) stenosis: Secondary | ICD-10-CM | POA: Insufficient documentation

## 2016-08-09 DIAGNOSIS — D696 Thrombocytopenia, unspecified: Secondary | ICD-10-CM | POA: Insufficient documentation

## 2016-08-09 DIAGNOSIS — Z794 Long term (current) use of insulin: Secondary | ICD-10-CM | POA: Insufficient documentation

## 2016-08-09 DIAGNOSIS — I482 Chronic atrial fibrillation: Secondary | ICD-10-CM | POA: Insufficient documentation

## 2016-08-09 DIAGNOSIS — J449 Chronic obstructive pulmonary disease, unspecified: Secondary | ICD-10-CM | POA: Diagnosis not present

## 2016-08-09 DIAGNOSIS — M109 Gout, unspecified: Secondary | ICD-10-CM | POA: Insufficient documentation

## 2016-08-09 DIAGNOSIS — Z9889 Other specified postprocedural states: Secondary | ICD-10-CM | POA: Insufficient documentation

## 2016-08-09 DIAGNOSIS — K219 Gastro-esophageal reflux disease without esophagitis: Secondary | ICD-10-CM | POA: Insufficient documentation

## 2016-08-09 DIAGNOSIS — I5032 Chronic diastolic (congestive) heart failure: Secondary | ICD-10-CM | POA: Diagnosis not present

## 2016-08-09 DIAGNOSIS — K7460 Unspecified cirrhosis of liver: Secondary | ICD-10-CM | POA: Diagnosis not present

## 2016-08-09 DIAGNOSIS — E785 Hyperlipidemia, unspecified: Secondary | ICD-10-CM | POA: Insufficient documentation

## 2016-08-09 DIAGNOSIS — G4733 Obstructive sleep apnea (adult) (pediatric): Secondary | ICD-10-CM | POA: Insufficient documentation

## 2016-08-09 DIAGNOSIS — I130 Hypertensive heart and chronic kidney disease with heart failure and stage 1 through stage 4 chronic kidney disease, or unspecified chronic kidney disease: Secondary | ICD-10-CM | POA: Insufficient documentation

## 2016-08-09 DIAGNOSIS — Z951 Presence of aortocoronary bypass graft: Secondary | ICD-10-CM | POA: Insufficient documentation

## 2016-08-09 DIAGNOSIS — N183 Chronic kidney disease, stage 3 (moderate): Secondary | ICD-10-CM | POA: Insufficient documentation

## 2016-08-09 DIAGNOSIS — I2510 Atherosclerotic heart disease of native coronary artery without angina pectoris: Secondary | ICD-10-CM | POA: Diagnosis not present

## 2016-08-09 DIAGNOSIS — Z953 Presence of xenogenic heart valve: Secondary | ICD-10-CM | POA: Insufficient documentation

## 2016-08-09 DIAGNOSIS — Z7901 Long term (current) use of anticoagulants: Secondary | ICD-10-CM | POA: Diagnosis not present

## 2016-08-09 LAB — BASIC METABOLIC PANEL
ANION GAP: 10 (ref 5–15)
BUN: 47 mg/dL — ABNORMAL HIGH (ref 6–20)
CALCIUM: 9 mg/dL (ref 8.9–10.3)
CO2: 26 mmol/L (ref 22–32)
Chloride: 100 mmol/L — ABNORMAL LOW (ref 101–111)
Creatinine, Ser: 1.92 mg/dL — ABNORMAL HIGH (ref 0.61–1.24)
GFR, EST AFRICAN AMERICAN: 40 mL/min — AB (ref >60)
GFR, EST NON AFRICAN AMERICAN: 34 mL/min — AB (ref >60)
GLUCOSE: 91 mg/dL (ref 65–99)
POTASSIUM: 3.9 mmol/L (ref 3.5–5.1)
Sodium: 136 mmol/L (ref 135–145)

## 2016-08-09 LAB — BRAIN NATRIURETIC PEPTIDE: B Natriuretic Peptide: 171.7 pg/mL — ABNORMAL HIGH (ref 0.0–100.0)

## 2016-08-09 MED ORDER — SPIRONOLACTONE 50 MG PO TABS: 50.0000 mg | ORAL_TABLET | Freq: Two times a day (BID) | ORAL | 3 refills | Status: DC

## 2016-08-09 MED ORDER — METOLAZONE 2.5 MG PO TABS: ORAL_TABLET | ORAL | 6 refills | Status: DC

## 2016-08-09 NOTE — Patient Instructions (Signed)
Labs today (will call for abnormal results, otherwise no news is good news)  Increase Spironolactone to 50 mg (1 Tablet) Two times daily  Start taking Metolazone 2.5 mg (1 Tablet) every Monday, Wednesday, Friday, and Saturday  You have been scheduled for a Paracentesis this Thursday January 18th at [facility] for 1:45pm.  Lab work and Echo in 10 days  Follow up in 1 Month

## 2016-08-10 NOTE — Progress Notes (Signed)
Patient ID: [name], male   DOB: [birth year], 71 y.o.   MRN: [id number]    Advanced Heart Failure Clinic Note   GI: Dr Laural Golden- followed for cirrhosis Pulmonary: Dr Melvyn Novas  PCP: Marjean Donna [facility])  Cardiology: Dr. Aundra Dubin  Mr [name] is a 71 yo with history of CAD s/p CABG, permanent atrial fibrillation, cirrhosis (NASH and likely component of CHF-related cirrhosis) chronic diastolic CHF, severe AS s/p bioprosthetic AVR who presents for evaluation of diastolic CHF.  Last LHC in 8/13 showed patent grafts.  AVR was in 10/13.  Last echo in 8/16 showed EF 55-60%, normal bioprosthetic aortic valve.   July 2015:  Admitted to the hospital for GI bleed requiring 2 PRBCs. No additional GI work-up with recent EGD showing gastritis.  He had an AV nodal ablation in 9/15 with significant improvement after this.  He has St Jude PPM.   In 6/17, had right knee operation.    He has been having therapeutic paracenteses about every 2-3 weeks recently at [facility].  Last was 07/29/16.  Lately, weight has been going up (up 4 lbs by our scales).  He has been taking diuretics as ordered but is markedly noncompliant with fluid and sodium restriction (eats hotdogs, luncheon meat, etc, and drinks sometimes up to 6 L fluid a day).  Currently, he is short of breath walking 100 feet. +Orthopnea.  No chest pain.    Labs (8/14) LDL 38 Labs (3/15) K 3.4, creatinine 1.7, BUN 55  Labs (10/15/13) K 4.0 Creatinine 1.6 BUN 32 Labs (10/29/13) K 4.0 Creatinine 1.6  Labs (4/15) K 4.2 Creatinine 1.32 => 1.5, HCT 35.2 Labs (12/11/13) K 3.6 Creatinine 1.56 Pro BNP 1152 Hemoglobin 9.6  Labs (01/12/14) K 3.0, creatinine 2.0, hemoglobin 8.8 Labs (01/31/14) K 3.3, creatinine 1.49 Labs (8/15) K 3.9, creatinine 1.89 Labs (9/15): K 3.7, creatinine 1.6, AST 48, ALT 52, TSH normal Labs (10/15): K 3.5, creatinine 1.58, BNP 780 Labs (3/16): K 3.5, creatinine 1.6 Labs 12/03/2014: K 4.4 Creatinine 1.55 Labs 12/12/2014: K 3.7  Creatinine 1.48, LDL 50 Labs 8/16: K 3.8, creatinine 2.42 Labs 04/07/2015: K 4.3, Creatinine 2.22, BNP 169 Labs (11/16): K 4, creatinine 1.78 => 1.55, BNP 120 Labs 11/11/2015 K 3.7 Creatinine 1.53  Labs (6/17): K 3.8, creatinine 1.52 Labs (12/17): K 3.5, creatinine 1.95, LDL 32  PMH: 1. CAD: s/p CABG 2004. Last LHC (8/13) with patent free radial-OM, patent SVG-D, patent LIMA-LAD.  2. Permanent atrial fibrillation: Not anticoagulated due to GI bleeding.  Holter monitor (4/15) with mean HR 115 (afib).   He had AV nodal ablation with St Jude dual chamber PPM  3. Chronic diastolic CHF: Echo (123456) with EF 55-60%, bioprosthetic aortic valve with mean gradient 22 mmHg, mildly decreased RV systolic function. RHC (12/2013): RA 14, RV 44/4/11, PA 50/23 (33), PCWP 20, v = 35, Fick CO/CI: 6.0/2.6, PVR 2.2 WU, PA 59%.  Echo (8/15) with EF 55-60%, mild LVH, bioprosthetic aortic valve with mean gradient 16 mmHg, PA systolic pressure 46 mmHg, mild to moderate MR.  Echo (8/16) with EF 55-60%, normal bioprosthetic aortic valve, mild MR, PASP 44 mmHg.  - Gynecomastia with spironolactone.  4. Severe aortic stenosis s/p bioprosthetic AVR in 10/13. Mean gradient 22 mmHg across valve on echo in 8/14.  Mean gradient 16 mmHg on echo in 8/15.  5. NASH with cirrhosis: ascites, thrombocytopenia.  CT abdomen in 6/17 showed cirrhosis and moderate ascites. He has regular paracenteses.  6. 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  EGD in 1/15 showed mild portal gastropathy and 2 small antral ulcers. Recurrent GI bleed in 6/15, EGD showed gastritis and ?GAVE.  7. Hyperlipidemia 8. HTN 9. GERD 10. COPD 17. OSA: Cannot tolerate CPAP.  12. 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  14. Carotid dopplers (11/15) with minimal stenosis.  15. ABI 12/11/2014: normal    SH: Lives with wife in [place], prior smoker.   FH: CAD  ROS: All systems reviewed and negative except as per HPI.   Current Outpatient Prescriptions on File Prior to  Encounter  Medication Sig Dispense Refill  . acetaminophen (TYLENOL) 500 MG tablet Take 2,000 mg by mouth at bedtime as needed for mild pain or headache.    . allopurinol (ZYLOPRIM) 100 MG tablet Take 1 tablet (100 mg total) by mouth daily. TAKE 1 TABLET (100 MG TOTAL) BY MOUTH DAILY. 30 tablet 6  . atorvastatin (LIPITOR) 20 MG tablet Take 20 mg by mouth daily at 6 PM.     . colchicine 0.6 MG tablet Take 0.6 mg by mouth daily.     . ferrous gluconate (FERGON) 240 (27 FE) MG tablet Take 240 mg by mouth 2 (two) times daily.    [name] gabapentin (NEURONTIN) 300 MG capsule Take 300 mg by mouth 3 (three) times daily.  6  . insulin NPH Human (HUMULIN N,NOVOLIN N) 100 UNIT/ML injection Inject 70 Units into the skin 2 (two) times daily before a meal.     . insulin regular (NOVOLIN R,HUMULIN R) 100 units/mL injection Inject 30 Units into the skin 2 (two) times daily before a meal.     . metoprolol succinate (TOPROL-XL) 100 MG 24 hr tablet TAKE 1 TABLET (100 MG TOTAL) BY MOUTH 2 (TWO) TIMES DAILY. 60 tablet 6  . Multiple Vitamins-Minerals ([employer]S SPECTRAVITE ADULT 50+ PO) Take 1 tablet by mouth daily.    [name] omeprazole (PRILOSEC) 20 MG capsule Take 1 capsule (20 mg total) by mouth 2 (two) times daily before a meal. 30 capsule 5  . potassium chloride SA (K-DUR,KLOR-CON) 20 MEQ tablet Take 4 tablets (80 mEq total) by mouth 4 (four) times daily. Take an extra 2 tablets on M,W,Sa with metolazone 500 tablet 6  . sertraline (ZOLOFT) 50 MG tablet Take 50 mg by mouth daily.    . tamsulosin (FLOMAX) 0.4 MG CAPS capsule TAKE 1 CAPSULE (0.4 MG TOTAL) BY MOUTH DAILY AFTER SUPPER. 30 capsule 3  . torsemide (DEMADEX) 20 MG tablet TAKE 6 TABLETS IN THE MORNING AND 5 TABLETS IN THE EVENING 330 tablet 3  . vitamin C (ASCORBIC ACID) 500 MG tablet Take 500 mg by mouth 2 (two) times daily.    . benzonatate (TESSALON) 200 MG capsule Take 1 capsule (200 mg total) by mouth 3 (three) times daily as needed for cough. (Patient not taking:  Reported on 08/09/2016) 100 capsule 1  . methocarbamol (ROBAXIN) 500 MG tablet Take 1 tablet (500 mg total) by mouth 4 (four) times daily. As needed for muscle spasm 30 tablet 1   No current facility-administered medications on file prior to encounter.     Vitals:   08/09/16 0941  BP: (!) 107/58  Pulse: 78  SpO2: 96%  Weight: 286 lb (129.7 kg)     Wt Readings from Last 3 Encounters:  08/09/16 286 lb (129.7 kg)  06/21/16 280 lb 14.4 oz (127.4 kg)  05/12/16 282 lb 12.8 oz (128.3 kg)     General: NAD. Ambulated in the clinic without difficulty.  Neck: JVP  10 cm with HJR, no thyromegaly or thyroid nodule.  Lungs: CTAB, normal effort. CV: Nondisplaced PMI.  Irregularly irregular, 2/6 early SEM RUSB.  1+ ankle edema. Left carotid bruit.  1+ PT pulses bilaterally.  Abdomen: Soft, obese, NT, abdomen moderately distended, no HSM. No bruits or masses. +BS. Varicose veins.  Skin: Intact without lesions or rashes.  Neurologic: Alert and oriented x 3.  Psych: Normal affect. Extremities: No clubbing or cyanosis.   Assessment/Plan: 1. Chronic diastolic CHF:  EF 0000000 (02/2015).  NYHA class III symptoms.  He is volume overloaded again in the setting of marked fluid and sodium noncompliance. He has prominent ascites due to cardiac cirrhosis/NASH. - We talked extensively today about fluid and sodium restriction.  - Continue torsemide 120 qam/100 qpm. BMET/BNP today.   - Increase metolazone to 4 times a week.   - Given prominent ascites, increase spironolactone to 50 mg bid.   - BMET in 10 days.  - I will arrange paracentesis and set up standing order for patient to have paracentesis as needed every 3 wks. - I will arrange for an echocardiogram. 2. Chronic atrial fibrillation:  CHADSVASC 4 - age > 34, CHF, HTN, DM2. 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 s/p AV node ablation with dual chamber pacing given very difficult rate control.  -  Intolerant to ASA and coumadin, so no Watchman pursued. 3. CKD stage III: BMET today.  4. Bioprosthetic AVR: I will arrange for an echo.   5. CAD: s/p CABG.  No chest pain.   - Continue atorvastatin 20 mg daily and Toprol XL 100 mg BID.  - Off ASA and anticoag with history of GI bleed.  6. Cirrhosis: May be a combination of cardiac cirrhosis and NASH-related.Follows with Dr. Laural Golden.  - Prominent ascites, as above.  Increasing spironolactone.   Follow up in 1 month.   [name] 08/10/2016

## 2016-08-11 ENCOUNTER — Ambulatory Visit (HOSPITAL_COMMUNITY): Admission: RE | Admit: 2016-08-11 | Payer: Medicare Other | Source: Ambulatory Visit

## 2016-08-12 ENCOUNTER — Ambulatory Visit (HOSPITAL_COMMUNITY)
Admission: RE | Admit: 2016-08-12 | Discharge: 2016-08-12 | Disposition: A | Discharge status: Home / Self Care | Payer: Medicare Other | Source: Ambulatory Visit | Attending: Cardiology | Admitting: Cardiology

## 2016-08-12 DIAGNOSIS — R188 Other ascites: Secondary | ICD-10-CM | POA: Diagnosis not present

## 2016-08-12 NOTE — Progress Notes (Signed)
Paracentesis complete no signs of distress. 6300 ml yellow colored ascites removed  

## 2016-08-12 NOTE — Procedures (Signed)
PreOperative Dx: Cirrhosis, ascites Postoperative Dx: Cirrhosis, ascites Procedure:   US guided paracentesis Radiologist:  Thornton Papas Anesthesia:  10 ml of1% lidocaine Specimen:  6.3 L of amber colored ascitic fluid EBL:   < 1 ml Complications: None

## 2016-08-16 ENCOUNTER — Other Ambulatory Visit (HOSPITAL_COMMUNITY): Payer: Self-pay | Admitting: Cardiology

## 2016-08-19 ENCOUNTER — Ambulatory Visit (HOSPITAL_BASED_OUTPATIENT_CLINIC_OR_DEPARTMENT_OTHER)
Admission: RE | Admit: 2016-08-19 | Discharge: 2016-08-19 | Disposition: A | Discharge status: Home / Self Care | Payer: Medicare Other | Source: Ambulatory Visit | Attending: Cardiology | Admitting: Cardiology

## 2016-08-19 ENCOUNTER — Ambulatory Visit (HOSPITAL_COMMUNITY)
Admission: RE | Admit: 2016-08-19 | Discharge: 2016-08-19 | Disposition: A | Discharge status: Home / Self Care | Payer: Medicare Other | Source: Ambulatory Visit | Attending: Family Medicine | Admitting: Family Medicine

## 2016-08-19 DIAGNOSIS — I110 Hypertensive heart disease with heart failure: Secondary | ICD-10-CM | POA: Diagnosis not present

## 2016-08-19 DIAGNOSIS — Z6841 Body Mass Index (BMI) 40.0 and over, adult: Secondary | ICD-10-CM | POA: Diagnosis not present

## 2016-08-19 DIAGNOSIS — I082 Rheumatic disorders of both aortic and tricuspid valves: Secondary | ICD-10-CM | POA: Diagnosis not present

## 2016-08-19 DIAGNOSIS — I509 Heart failure, unspecified: Secondary | ICD-10-CM | POA: Diagnosis not present

## 2016-08-19 DIAGNOSIS — E669 Obesity, unspecified: Secondary | ICD-10-CM | POA: Insufficient documentation

## 2016-08-19 DIAGNOSIS — I4891 Unspecified atrial fibrillation: Secondary | ICD-10-CM | POA: Insufficient documentation

## 2016-08-19 DIAGNOSIS — Z87891 Personal history of nicotine dependence: Secondary | ICD-10-CM | POA: Insufficient documentation

## 2016-08-19 DIAGNOSIS — J449 Chronic obstructive pulmonary disease, unspecified: Secondary | ICD-10-CM | POA: Insufficient documentation

## 2016-08-19 LAB — BASIC METABOLIC PANEL
Anion gap: 10 (ref 5–15)
BUN: 73 mg/dL — AB (ref 6–20)
CHLORIDE: 98 mmol/L — AB (ref 101–111)
CO2: 26 mmol/L (ref 22–32)
CREATININE: 2.55 mg/dL — AB (ref 0.61–1.24)
Calcium: 9.2 mg/dL (ref 8.9–10.3)
GFR calc Af Amer: 28 mL/min — ABNORMAL LOW (ref >60)
GFR, EST NON AFRICAN AMERICAN: 24 mL/min — AB (ref >60)
Glucose, Bld: 155 mg/dL — ABNORMAL HIGH (ref 65–99)
Potassium: 4.8 mmol/L (ref 3.5–5.1)
SODIUM: 134 mmol/L — AB (ref 135–145)

## 2016-08-19 NOTE — Progress Notes (Signed)
  Echocardiogram 2D Echocardiogram has been performed.  [name] 08/19/2016, 9:58 AM

## 2016-08-22 ENCOUNTER — Telehealth (HOSPITAL_COMMUNITY): Payer: Self-pay | Admitting: *Deleted

## 2016-08-22 DIAGNOSIS — I509 Heart failure, unspecified: Secondary | ICD-10-CM

## 2016-08-22 MED ORDER — POTASSIUM CHLORIDE CRYS ER 20 MEQ PO TBCR: 80.0000 meq | EXTENDED_RELEASE_TABLET | Freq: Two times a day (BID) | ORAL | 6 refills | Status: DC

## 2016-08-22 MED ORDER — METOLAZONE 2.5 MG PO TABS: ORAL_TABLET | ORAL | 6 refills | Status: DC

## 2016-08-22 NOTE — Telephone Encounter (Signed)
Notes Recorded by Kennieth Rad, RN on 08/22/2016 at 9:29 AM EST Spoke with patient and he is agreeable with plan. Medications updated in epic, labs order placed, and added to lab schedule for Wednesday. ------  Notes Recorded by Larey Dresser, MD on 08/20/2016 at 10:48 PM EST Creatinine higher but he was very volume overloaded. Decrease metolazone down to 3 times/week. Decrease KCl to 80 bid. Repeat BMET in Wednesday or Thursday.     Notes Recorded by Kennieth Rad, RN on 08/22/2016 at 9:27 AM EST Spoke with patient and he is aware. Will want to discuss further details at his next appointment regarding pulmonary htn. ------  Notes Recorded by Larey Dresser, MD on 08/20/2016 at 10:46 PM EST Normal EF, bioprosthetic aortic valve ok. Moderate pulmonary hypertension.

## 2016-08-24 ENCOUNTER — Ambulatory Visit (HOSPITAL_COMMUNITY)
Admission: RE | Admit: 2016-08-24 | Discharge: 2016-08-24 | Disposition: A | Discharge status: Home / Self Care | Payer: Medicare Other | Source: Ambulatory Visit | Attending: Internal Medicine | Admitting: Internal Medicine

## 2016-08-24 DIAGNOSIS — I509 Heart failure, unspecified: Secondary | ICD-10-CM | POA: Insufficient documentation

## 2016-08-24 LAB — BASIC METABOLIC PANEL
Anion gap: 9 (ref 5–15)
BUN: 67 mg/dL — AB (ref 6–20)
CALCIUM: 8.4 mg/dL — AB (ref 8.9–10.3)
CO2: 26 mmol/L (ref 22–32)
CREATININE: 2.51 mg/dL — AB (ref 0.61–1.24)
Chloride: 100 mmol/L — ABNORMAL LOW (ref 101–111)
GFR calc Af Amer: 29 mL/min — ABNORMAL LOW (ref >60)
GFR, EST NON AFRICAN AMERICAN: 25 mL/min — AB (ref >60)
GLUCOSE: 72 mg/dL (ref 65–99)
POTASSIUM: 4.2 mmol/L (ref 3.5–5.1)
SODIUM: 135 mmol/L (ref 135–145)

## 2016-08-26 ENCOUNTER — Other Ambulatory Visit (HOSPITAL_COMMUNITY): Payer: Self-pay | Admitting: *Deleted

## 2016-08-26 ENCOUNTER — Ambulatory Visit (HOSPITAL_COMMUNITY)
Admission: RE | Admit: 2016-08-26 | Discharge: 2016-08-26 | Disposition: A | Discharge status: Home / Self Care | Payer: Medicare Other | Source: Ambulatory Visit | Attending: Cardiology | Admitting: Cardiology

## 2016-08-26 ENCOUNTER — Encounter (HOSPITAL_COMMUNITY): Payer: Self-pay

## 2016-08-26 DIAGNOSIS — R188 Other ascites: Secondary | ICD-10-CM

## 2016-08-26 DIAGNOSIS — K7460 Unspecified cirrhosis of liver: Secondary | ICD-10-CM | POA: Insufficient documentation

## 2016-08-26 DIAGNOSIS — R140 Abdominal distension (gaseous): Secondary | ICD-10-CM | POA: Diagnosis not present

## 2016-08-26 MED ORDER — SPIRONOLACTONE 50 MG PO TABS: 50.0000 mg | ORAL_TABLET | Freq: Two times a day (BID) | ORAL | 3 refills | Status: DC

## 2016-08-26 NOTE — Progress Notes (Signed)
Paracentesis complete no signs of distress. 5.6L amber colored ascites removed.

## 2016-08-26 NOTE — Procedures (Signed)
PreOperative Dx: Cirrhosis, ascites Postoperative Dx: Cirrhosis, ascites Procedure:   US guided paracentesis Radiologist:  Ulus Hazen Anesthesia:  10 ml of1% lidocaine Specimen:  5.6 L of yellow ascitic fluid EBL:   < 1 ml Complications: None   

## 2016-09-02 DIAGNOSIS — M549 Dorsalgia, unspecified: Secondary | ICD-10-CM | POA: Diagnosis not present

## 2016-09-07 ENCOUNTER — Telehealth (HOSPITAL_COMMUNITY): Payer: Self-pay

## 2016-09-07 DIAGNOSIS — R188 Other ascites: Secondary | ICD-10-CM

## 2016-09-07 NOTE — Telephone Encounter (Signed)
Patient requesting US paracentesis. Scheduled with Forestine Na Firday at 1300. Patient aware. New order for standing paracentesis with 20 releases placed.  Renee Pain, RN

## 2016-09-09 ENCOUNTER — Encounter (HOSPITAL_COMMUNITY): Payer: Self-pay

## 2016-09-09 ENCOUNTER — Ambulatory Visit (HOSPITAL_COMMUNITY)
Admission: RE | Admit: 2016-09-09 | Discharge: 2016-09-09 | Disposition: A | Discharge status: Home / Self Care | Payer: Medicare Other | Source: Ambulatory Visit | Attending: Cardiology | Admitting: Cardiology

## 2016-09-09 DIAGNOSIS — I509 Heart failure, unspecified: Secondary | ICD-10-CM | POA: Diagnosis not present

## 2016-09-09 DIAGNOSIS — R188 Other ascites: Secondary | ICD-10-CM | POA: Insufficient documentation

## 2016-09-09 NOTE — Progress Notes (Signed)
Paracentesis complete no signs of distress.  

## 2016-09-12 ENCOUNTER — Ambulatory Visit (HOSPITAL_COMMUNITY)
Admission: RE | Admit: 2016-09-12 | Discharge: 2016-09-12 | Disposition: A | Discharge status: Home / Self Care | Payer: Medicare Other | Source: Ambulatory Visit | Attending: Cardiology | Admitting: Cardiology

## 2016-09-12 VITALS — BP 108/64 | HR 94 | Wt 264.8 lb

## 2016-09-12 DIAGNOSIS — E1122 Type 2 diabetes mellitus with diabetic chronic kidney disease: Secondary | ICD-10-CM | POA: Diagnosis not present

## 2016-09-12 DIAGNOSIS — Z951 Presence of aortocoronary bypass graft: Secondary | ICD-10-CM | POA: Diagnosis not present

## 2016-09-12 DIAGNOSIS — I5032 Chronic diastolic (congestive) heart failure: Secondary | ICD-10-CM | POA: Diagnosis not present

## 2016-09-12 DIAGNOSIS — M109 Gout, unspecified: Secondary | ICD-10-CM | POA: Diagnosis not present

## 2016-09-12 DIAGNOSIS — K7460 Unspecified cirrhosis of liver: Secondary | ICD-10-CM | POA: Insufficient documentation

## 2016-09-12 DIAGNOSIS — D696 Thrombocytopenia, unspecified: Secondary | ICD-10-CM | POA: Diagnosis not present

## 2016-09-12 DIAGNOSIS — I482 Chronic atrial fibrillation: Secondary | ICD-10-CM | POA: Insufficient documentation

## 2016-09-12 DIAGNOSIS — I350 Nonrheumatic aortic (valve) stenosis: Secondary | ICD-10-CM | POA: Insufficient documentation

## 2016-09-12 DIAGNOSIS — E785 Hyperlipidemia, unspecified: Secondary | ICD-10-CM | POA: Diagnosis not present

## 2016-09-12 DIAGNOSIS — K219 Gastro-esophageal reflux disease without esophagitis: Secondary | ICD-10-CM | POA: Diagnosis not present

## 2016-09-12 DIAGNOSIS — Z8249 Family history of ischemic heart disease and other diseases of the circulatory system: Secondary | ICD-10-CM | POA: Insufficient documentation

## 2016-09-12 DIAGNOSIS — N183 Chronic kidney disease, stage 3 (moderate): Secondary | ICD-10-CM | POA: Insufficient documentation

## 2016-09-12 DIAGNOSIS — I2510 Atherosclerotic heart disease of native coronary artery without angina pectoris: Secondary | ICD-10-CM | POA: Diagnosis not present

## 2016-09-12 DIAGNOSIS — R188 Other ascites: Secondary | ICD-10-CM | POA: Diagnosis not present

## 2016-09-12 DIAGNOSIS — I130 Hypertensive heart and chronic kidney disease with heart failure and stage 1 through stage 4 chronic kidney disease, or unspecified chronic kidney disease: Secondary | ICD-10-CM | POA: Insufficient documentation

## 2016-09-12 DIAGNOSIS — Z87891 Personal history of nicotine dependence: Secondary | ICD-10-CM | POA: Insufficient documentation

## 2016-09-12 DIAGNOSIS — Z953 Presence of xenogenic heart valve: Secondary | ICD-10-CM | POA: Insufficient documentation

## 2016-09-12 DIAGNOSIS — J449 Chronic obstructive pulmonary disease, unspecified: Secondary | ICD-10-CM | POA: Diagnosis not present

## 2016-09-12 DIAGNOSIS — G4733 Obstructive sleep apnea (adult) (pediatric): Secondary | ICD-10-CM | POA: Insufficient documentation

## 2016-09-12 LAB — BASIC METABOLIC PANEL
ANION GAP: 12 (ref 5–15)
BUN: 63 mg/dL — ABNORMAL HIGH (ref 6–20)
CALCIUM: 8.9 mg/dL (ref 8.9–10.3)
CO2: 25 mmol/L (ref 22–32)
Chloride: 96 mmol/L — ABNORMAL LOW (ref 101–111)
Creatinine, Ser: 2.03 mg/dL — ABNORMAL HIGH (ref 0.61–1.24)
GFR calc Af Amer: 37 mL/min — ABNORMAL LOW (ref >60)
GFR, EST NON AFRICAN AMERICAN: 32 mL/min — AB (ref >60)
Glucose, Bld: 180 mg/dL — ABNORMAL HIGH (ref 65–99)
Potassium: 4.4 mmol/L (ref 3.5–5.1)
SODIUM: 133 mmol/L — AB (ref 135–145)

## 2016-09-12 LAB — BRAIN NATRIURETIC PEPTIDE: B Natriuretic Peptide: 154.3 pg/mL — ABNORMAL HIGH (ref 0.0–100.0)

## 2016-09-12 NOTE — Patient Instructions (Signed)
Labs today  You have been referred to Dr Joelyn Oms at [facility], they will call you for an appointment  Your physician recommends that you schedule a follow-up appointment in: 3 months

## 2016-09-12 NOTE — Progress Notes (Signed)
Patient ID: [name], male   DOB: Jan 30, 1946, 71 y.o.   MRN: [id number]    Advanced Heart Failure Clinic Note   GI: Dr Laural Golden- followed for cirrhosis Pulmonary: Dr Melvyn Novas  PCP: Marjean Donna [facility])  Cardiology: Dr. 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  Last LHC in 8/13 showed patent grafts.  AVR was in 10/13.  Last echo in 1/18 showed EF 60-65%, normal bioprosthetic aortic valve.   July 2015:  Admitted to the hospital for GI bleed requiring 2 PRBCs. No additional GI work-up with recent EGD showing gastritis.  He had an AV nodal ablation in 9/15 with significant improvement after this.  He has St Jude PPM.   In 6/17, had right knee operation.    [name] seems to be doing better.  He is getting regular paracenteses every 2 wks.  Weight is down 22 lbs. He is taking metolazone three times a week now.  Currently, no dyspnea walking on flat ground.  He can walk all over [employer].  He is short of breath with stairs.  No orthopnea/PND.  No chest pain.    Labs (8/14) LDL 38 Labs (3/15) K 3.4, creatinine 1.7, BUN 55  Labs (10/15/13) K 4.0 Creatinine 1.6 BUN 32 Labs (10/29/13) K 4.0 Creatinine 1.6  Labs (4/15) K 4.2 Creatinine 1.32 => 1.5, HCT 35.2 Labs (12/11/13) K 3.6 Creatinine 1.56 Pro BNP 1152 Hemoglobin 9.6  Labs (01/12/14) K 3.0, creatinine 2.0, hemoglobin 8.8 Labs (01/31/14) K 3.3, creatinine 1.49 Labs (8/15) K 3.9, creatinine 1.89 Labs (9/15): K 3.7, creatinine 1.6, AST 48, ALT 52, TSH normal Labs (10/15): K 3.5, creatinine 1.58, BNP 780 Labs (3/16): K 3.5, creatinine 1.6 Labs 12/03/2014: K 4.4 Creatinine 1.55 Labs 12/12/2014: K 3.7 Creatinine 1.48, LDL 50 Labs 8/16: K 3.8, creatinine 2.42 Labs 04/07/2015: K 4.3, Creatinine 2.22, BNP 169 Labs (11/16): K 4,  creatinine 1.78 => 1.55, BNP 120 Labs 11/11/2015 K 3.7 Creatinine 1.53  Labs (6/17): K 3.8, creatinine 1.52 Labs (12/17): K 3.5, creatinine 1.95, LDL 32 Labs (1/18): K 4.2, creatinine 2.51  PMH: 1. CAD: s/p CABG 2004. Last LHC (8/13) with patent free radial-OM, patent SVG-D, patent LIMA-LAD.  2. Permanent atrial fibrillation: Not anticoagulated due to GI bleeding.  Holter monitor (4/15) with mean HR 115 (afib).   He had AV nodal ablation with St Jude dual chamber PPM  3. Chronic diastolic CHF: Echo (123456) with EF 55-60%, bioprosthetic aortic valve with mean gradient 22 mmHg, mildly decreased RV systolic function. RHC (12/2013): RA 14, RV 44/4/11, PA 50/23 (33), PCWP 20, v = 35, Fick CO/CI: 6.0/2.6, PVR 2.2 WU, PA 59%.  Echo (8/15) with EF 55-60%, mild LVH, bioprosthetic aortic valve with mean gradient 16 mmHg, PA systolic pressure 46 mmHg, mild to moderate MR.  Echo (8/16) with EF 55-60%, normal bioprosthetic aortic valve, mild MR, PASP 44 mmHg.  - Gynecomastia with spironolactone.  - Echo (1/18): EF 60-65%, grade II diastolic dysfunction, bioprosthetic aortic valve appeared normal, PA systolic pressure 59 mmHg.  4. Severe aortic stenosis s/p bioprosthetic AVR in 10/13. Mean gradient 22 mmHg across valve on echo in 8/14.  Mean gradient 16 mmHg on echo in 8/15. Mean gradient 15 mmHg by echo in 1/18.  5. NASH with cirrhosis: ascites, thrombocytopenia.  CT  abdomen in 6/17 showed cirrhosis and moderate ascites. He has regular paracenteses.  6. 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  EGD in 1/15 showed mild portal gastropathy and 2 small antral ulcers. Recurrent GI bleed in 6/15, EGD showed gastritis and ?GAVE.  7. Hyperlipidemia 8. HTN 9. GERD 10. COPD 71. OSA: Cannot tolerate CPAP.  12. 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  14. Carotid dopplers (11/15) with minimal stenosis.  15. ABI 12/11/2014: normal    SH: Lives with wife in [place], prior smoker.   FH: CAD  ROS: All systems reviewed and negative  except as per HPI.   Current Outpatient Prescriptions on File Prior to Encounter  Medication Sig Dispense Refill  . acetaminophen (TYLENOL) 500 MG tablet Take 2,000 mg by mouth at bedtime as needed for mild pain or headache.    . allopurinol (ZYLOPRIM) 100 MG tablet Take 1 tablet (100 mg total) by mouth daily. TAKE 1 TABLET (100 MG TOTAL) BY MOUTH DAILY. 30 tablet 6  . atorvastatin (LIPITOR) 20 MG tablet Take 20 mg by mouth daily at 6 PM.     . colchicine 0.6 MG tablet Take 0.6 mg by mouth daily.     . ferrous gluconate (FERGON) 240 (27 FE) MG tablet Take 240 mg by mouth 2 (two) times daily.    [name] gabapentin (NEURONTIN) 300 MG capsule Take 300 mg by mouth 3 (three) times daily.  6  . insulin NPH Human (HUMULIN N,NOVOLIN N) 100 UNIT/ML injection Inject 70 Units into the skin 2 (two) times daily before a meal.     . insulin regular (NOVOLIN R,HUMULIN R) 100 units/mL injection Inject 30 Units into the skin 2 (two) times daily before a meal.     . methocarbamol (ROBAXIN) 500 MG tablet Take 1 tablet (500 mg total) by mouth 4 (four) times daily. As needed for muscle spasm 30 tablet 1  . metolazone (ZAROXOLYN) 2.5 MG tablet Take 2.5mg  (1 Tablet) every Monday, Wednesday, and Saturday. 16 tablet 6  . metoprolol succinate (TOPROL-XL) 100 MG 24 hr tablet TAKE 1 TABLET (100 MG TOTAL) BY MOUTH 2 (TWO) TIMES DAILY. 60 tablet 6  . Multiple Vitamins-Minerals ([employer] SPECTRAVITE ADULT 50+ PO) Take 1 tablet by mouth daily.    [name] omeprazole (PRILOSEC) 20 MG capsule Take 1 capsule (20 mg total) by mouth 2 (two) times daily before a meal. 30 capsule 5  . potassium chloride SA (K-DUR,KLOR-CON) 20 MEQ tablet Take 4 tablets (80 mEq total) by mouth 2 (two) times daily. Take an extra 2 tablets on M,W,Sa with metolazone 500 tablet 6  . sertraline (ZOLOFT) 50 MG tablet Take 50 mg by mouth daily.    [name] spironolactone (ALDACTONE) 50 MG tablet Take 1 tablet (50 mg total) by mouth 2 (two) times daily. 60 tablet 3  . tamsulosin  (FLOMAX) 0.4 MG CAPS capsule TAKE 1 CAPSULE (0.4 MG TOTAL) BY MOUTH DAILY AFTER SUPPER. 30 capsule 3  . torsemide (DEMADEX) 20 MG tablet TAKE 6 TABLETS IN THE MORNING AND 5 TABLETS IN THE EVENING 330 tablet 3  . vitamin C (ASCORBIC ACID) 500 MG tablet Take 500 mg by mouth 2 (two) times daily.    . benzonatate (TESSALON) 200 MG capsule Take 1 capsule (200 mg total) by mouth 3 (three) times daily as needed for cough. (Patient not taking: Reported on 08/09/2016) 100 capsule 1   No current facility-administered medications on file prior to encounter.     Vitals:   09/12/16 0856  BP: 108/64  BP Location: Left Arm  Patient Position: Sitting  Cuff Size: Normal  Pulse: 94  SpO2: 97%  Weight: 264 lb 12.8 oz (120.1 kg)     Wt Readings from Last 3 Encounters:  09/12/16 264 lb 12.8 oz (120.1 kg)  08/09/16 286 lb (129.7 kg)  06/21/16 280 lb 14.4 oz (127.4 kg)     General: NAD. Ambulated in the clinic without difficulty.  Neck: No JVD, no thyromegaly or thyroid nodule.  Lungs: CTAB, normal effort. CV: Nondisplaced PMI.  Regular, 2/6 early SEM RUSB.  Trace ankle edema. Left carotid bruit.  1+ PT pulses bilaterally.  Abdomen: Soft, obese, NT, abdomen mildly distended, no HSM. No bruits or masses. +BS. Varicose veins.  Skin: Intact without lesions or rashes.  Neurologic: Alert and oriented x 3.  Psych: Normal affect. Extremities: No clubbing or cyanosis.   Assessment/Plan: 1. Chronic diastolic CHF:  EF 123456 (1/18).  NYHA class II-III symptoms.  He has ascites due to cardiac cirrhosis/NASH.  On exam today, he does not appear volume overloaded.  - Seems to be doing better with dietary sodium restriction.   - Continue torsemide 120 qam/100 qpm. BMET/BNP today.   - Continue metolazone 3 times a week.   - Continue spironolactone 50 mg bid.   - Continue regular paracenteses.  2. Chronic atrial fibrillation:  CHADSVASC 4 - age > 40, CHF, HTN, DM2. He is not anticoagulated (was on coumadin in past)  due to history of GI bleeding from AVMs. s/p AV node ablation with dual chamber pacing given very difficult rate control.  - Intolerant of ASA and coumadin even for short period, so no Watchman pursued. 3. CKD stage III: BMET today.  Creatinine has been trending up over time, will arrange for nephrology appointment (son sees Dr. Joelyn Oms, will try to get appt with him).  4. Bioprosthetic AVR: Stable on 1/18 echo.    5. CAD: s/p CABG.  No chest pain.   - Continue atorvastatin 20 mg daily and Toprol XL 100 mg BID.  - Off ASA and anticoag with history of GI bleed.  6. Cirrhosis: May be a combination of cardiac cirrhosis and NASH-related. Follows with Dr. Laural Golden.  - Prominent ascites, as above.  On spironolactone and getting regular paracenteses.   Follow up in 3 months.   [name] 09/12/2016

## 2016-09-13 ENCOUNTER — Telehealth (HOSPITAL_COMMUNITY): Payer: Self-pay | Admitting: *Deleted

## 2016-09-13 NOTE — Telephone Encounter (Signed)
Referral form and pt's records faxed to [facility]

## 2016-09-19 ENCOUNTER — Encounter (INDEPENDENT_AMBULATORY_CARE_PROVIDER_SITE_OTHER): Payer: Self-pay | Admitting: *Deleted

## 2016-09-19 ENCOUNTER — Other Ambulatory Visit (INDEPENDENT_AMBULATORY_CARE_PROVIDER_SITE_OTHER): Payer: Self-pay | Admitting: *Deleted

## 2016-09-19 DIAGNOSIS — K7460 Unspecified cirrhosis of liver: Secondary | ICD-10-CM

## 2016-09-19 DIAGNOSIS — D649 Anemia, unspecified: Secondary | ICD-10-CM

## 2016-09-19 NOTE — Telephone Encounter (Signed)
Pt sch w/ Kidney on 09/30/16

## 2016-09-21 ENCOUNTER — Telehealth (HOSPITAL_COMMUNITY): Payer: Self-pay | Admitting: *Deleted

## 2016-09-21 DIAGNOSIS — R188 Other ascites: Secondary | ICD-10-CM

## 2016-09-21 NOTE — Telephone Encounter (Signed)
Patient called in requesting for a paracentesis to be scheduled for him at [facility].  He has a standing order for this under Dr. Aundra Dubin.   Called and scheduled US paracentesis this Friday March 2nd at 1:15.  Patient is aware and order has been placed.  No further questions at this time.

## 2016-09-23 ENCOUNTER — Ambulatory Visit (HOSPITAL_COMMUNITY)
Admission: RE | Admit: 2016-09-23 | Discharge: 2016-09-23 | Disposition: A | Discharge status: Home / Self Care | Payer: Medicare Other | Source: Ambulatory Visit | Attending: Cardiology | Admitting: Cardiology

## 2016-09-23 DIAGNOSIS — I509 Heart failure, unspecified: Secondary | ICD-10-CM | POA: Insufficient documentation

## 2016-09-23 DIAGNOSIS — R188 Other ascites: Secondary | ICD-10-CM | POA: Diagnosis not present

## 2016-09-23 NOTE — Procedures (Signed)
PreOperative Dx: Cirrhosis, ascites Postoperative Dx: Cirrhosis, ascites Procedure:   US guided paracentesis Radiologist:  Thornton Papas Anesthesia:  10 ml of1% lidocaine Specimen:  5.5 L of amber colored ascitic fluid EBL:   < 1 ml Complications: None

## 2016-09-26 ENCOUNTER — Other Ambulatory Visit (HOSPITAL_COMMUNITY): Payer: Self-pay | Admitting: Student

## 2016-09-28 DIAGNOSIS — K7460 Unspecified cirrhosis of liver: Secondary | ICD-10-CM | POA: Diagnosis not present

## 2016-09-28 DIAGNOSIS — D649 Anemia, unspecified: Secondary | ICD-10-CM | POA: Diagnosis not present

## 2016-09-28 LAB — HEMOGLOBIN AND HEMATOCRIT, BLOOD
HCT: 33.5 % — ABNORMAL LOW (ref 38.5–50.0)
Hemoglobin: 9.7 g/dL — ABNORMAL LOW (ref 13.2–17.1)

## 2016-09-29 ENCOUNTER — Encounter: Payer: Medicare Other | Admitting: Internal Medicine

## 2016-09-30 ENCOUNTER — Ambulatory Visit (INDEPENDENT_AMBULATORY_CARE_PROVIDER_SITE_OTHER): Payer: Medicare Other | Admitting: Internal Medicine

## 2016-09-30 ENCOUNTER — Encounter: Payer: Self-pay | Admitting: Internal Medicine

## 2016-09-30 VITALS — BP 104/60 | HR 70 | Ht 68.0 in | Wt 277.8 lb

## 2016-09-30 DIAGNOSIS — I442 Atrioventricular block, complete: Secondary | ICD-10-CM | POA: Diagnosis not present

## 2016-09-30 DIAGNOSIS — Z950 Presence of cardiac pacemaker: Secondary | ICD-10-CM

## 2016-09-30 DIAGNOSIS — I5032 Chronic diastolic (congestive) heart failure: Secondary | ICD-10-CM | POA: Diagnosis not present

## 2016-09-30 DIAGNOSIS — Z9889 Other specified postprocedural states: Secondary | ICD-10-CM | POA: Diagnosis not present

## 2016-09-30 LAB — CUP PACEART INCLINIC DEVICE CHECK
Implantable Pulse Generator Implant Date: 20150831
Lead Channel Impedance Value: 712.5 Ohm
Lead Channel Pacing Threshold Amplitude: 0.5 V
Lead Channel Pacing Threshold Pulse Width: 0.4 ms
Lead Channel Setting Pacing Amplitude: 2.5 V
Lead Channel Setting Pacing Pulse Width: 0.4 ms
Lead Channel Setting Sensing Sensitivity: 6 mV
MDC IDC LEAD IMPLANT DT: 20150831
MDC IDC LEAD LOCATION: 753860
MDC IDC MSMT BATTERY VOLTAGE: 3.01 V
MDC IDC SESS DTM: 20180309134613
MDC IDC STAT BRADY RV PERCENT PACED: 99.82 %
Pulse Gen Serial Number: 3014543

## 2016-09-30 NOTE — Patient Instructions (Signed)

## 2016-09-30 NOTE — Progress Notes (Signed)
Patient Care Team: [name], MD as PCP - General (Family Medicine) Rogene Houston, MD (Gastroenterology) Nobie Putnam, MD (Hematology and Oncology) Rounding Lbcardiology, MD as Consulting Physician (Cardiology) Sherren Mocha, MD as Consulting Physician (Cardiology)   HPI  [name] is a 71 y.o. male Seen in follow-up for AV junction ablation and pacing undertaken for uncontrolled atrial fibrillation. This is occurring in the context of refractory HFpEF and  prior  bypass surgery undertaken 2004 and catheterization 8/13 demonstrating patent grafts; he is status post AVR.  He has history of CAD s/p CABG, permanent atrial fibrillation, chronic diastolic CHF, severe AS s/p bioprosthetic AVR who presents for evaluation of diastolic CHF. Last LHC in 8/13 showed patent grafts. AVR was in 10/13. Last echo in 8/16 showed EF 55-60%, normal bioprosthetic aortic valve.   DATE TEST    8/16    echo   EF 55-60 %   1/18    ECHO   EF 55-65 %          He has struggled with flow-volume. He is being diuresed under the care of the heart failure clinic. His creatinine has gone from 1.45-2.1 over the last 3 weeks. Potassium remains low. hE IS Now undergoing frequent paracentesis       Last laboratories from 10/15 demonstrated stable renal insufficiency and borderline hypokalemia.  Past Medical History:  Diagnosis Date  . Aortic stenosis 03/08/2012   a.  s/p tissue AVR 10/13 with Dr. [name];   b. Echo 10/13: mod LVH, EF 55-60%, tissue AVR not well seen, no leak, gradient not too high (mean 22mHg), MAC, mild MR, mild LAE, PASP 38  . Ascites    status post paracentesis with removal of 3.4 L of ascitic fluid  . Atrial fibrillation ([place]    Permanent; off of Coumadin for now due to GI bleed  . AVM (arteriovenous malformation)    Recurrent GI bleeding requiring multiple transfusions  . CAD (coronary artery disease)    a. s/p CABG 2004;  [place]  LHC 8/13: Free radial to  obtuse marginal patent, SVG-diagonal patent, LIMA-LAD patent, EF 65-70%, mean aortic valve gradient 42  . Carotid bruit 06/14/2011   a. pre-AVR dopplers 10/13: no sig ICA stenosis  . Chronic diastolic heart failure ([place]   . Cirrhosis ([place]   . COPD (chronic obstructive pulmonary disease) ([place]   . DM2 (diabetes mellitus, type 2) (HCC)    at least 10 yrs  . GERD (gastroesophageal reflux disease)   . H/O hiatal hernia   . Hyperlipidemia   . Hypertension    x 15 yrs  . Insomnia   . Iron deficiency anemia    Requiring intravenous iron  . Mediastinal adenopathy 09/22/2011  . OSA (obstructive sleep apnea) 1999    USES CPAP  . Osteoarthritis   . Overweight(278.02)   . Thrombocytopenia ([place]     Past Surgical History:  Procedure Laterality Date  . ABLATION  04-18-14   AVN ablation by Dr KCaryl Comes . AORTIC VALVE REPLACEMENT  04/25/2012   Procedure: AORTIC VALVE REPLACEMENT (AVR);  Surgeon: SMelrose Nakayama MD;  [place]  Service: Open Heart Surgery;  Laterality: N/A;  . AV NODE ABLATION N/A 04/18/2014   Procedure: AV NODE ABLATION;  Surgeon: SDeboraha Sprang MD;  [facility];  Service: Cardiovascular;  Laterality: N/A;  . CARPAL TUNNEL RELEASE    10/08/2003  . CATARACT EXTRACTION W/ INTRAOCULAR LENS IMPLANT Bilateral  09/28/2015 , 10/19/2015  . CORONARY ANGIOPLASTY WITH STENT PLACEMENT  01/19/2005   drug eluting stent to high grade ostial stenosis of radial artery graft to OM  . CORONARY ARTERY BYPASS GRAFT   10/15/2002    [name]. [name], M.D.     . ESOPHAGOGASTRODUODENOSCOPY N/A 07/31/2013   Procedure: ESOPHAGOGASTRODUODENOSCOPY (EGD);  Surgeon: Milus Banister, MD;  Location: [place];  Service: Endoscopy;  Laterality: N/A;  . ESOPHAGOGASTRODUODENOSCOPY Left 01/11/2014   Procedure: ESOPHAGOGASTRODUODENOSCOPY (EGD);  Surgeon: Juanita Craver, MD;  [facility] END[facility]COPY;  Service: Endoscopy;  Laterality: Left;  . HERNIA REPAIR    . KNEE ARTHROSCOPY Right 01/20/2016    Procedure: ARTHROSCOPY RIGHT KNEE WITH MENSICAL DEBRIDEMENT;  Surgeon: Gaynelle Arabian, MD;  Location: [facility];  Service: Orthopedics;  Laterality: Right;  . LEFT AND RIGHT HEART CATHETERIZATION WITH CORONARY/GRAFT ANGIOGRAM N/A 03/07/2012   Procedure: LEFT AND RIGHT HEART CATHETERIZATION WITH Beatrix Fetters;  Surgeon: Sherren Mocha, MD;  Location[facility] C[facility] LAB;  Service: Cardiovascular;  Laterality: N/A;  . lipoma surgery    . OTHER SURGICAL HISTORY  08/26/2011   [facility],  enteroscopy , revealing "three-four AVMs."   . PACEMAKER INSERTION  03-24-14   STJ Assurity single chamber pacemaker implanted by Dr Caryl Comes  . PARACENTESIS  12/2015  . PERMANENT PACEMAKER INSERTION N/A 03/24/2014   Procedure: PERMANENT PACEMAKER INSERTION;  Surgeon: Deboraha Sprang, MD;  Location: [facility] CATH LAB;  Service: Cardiovascular;  Laterality: N/A;  . RIGHT HEART CATHETERIZATION N/A 01/01/2014   Procedure: RIGHT HEART CATH;  Surgeon: Jolaine Artist, MD;  Location: [facility] CATH LAB;  Service: Cardiovascular;  Laterality: N/A;  . TEE WITHOUT CARDIOVERSION  03/07/2012   Procedure: TRANSESOPHAGEAL ECHOCARDIOGRAM (TEE);  Surgeon: Thayer Headings, MD;  Location[facility]COPY;  Service: Cardiovascular;  Laterality: N/A;    Current Outpatient Prescriptions  Medication Sig Dispense Refill  . acetaminophen (TYLENOL) 500 MG tablet Take 2,000 mg by mouth at bedtime as needed for mild pain or headache.    . allopurinol (ZYLOPRIM) 100 MG tablet Take 1 tablet (100 mg total) by mouth daily. TAKE 1 TABLET (100 MG TOTAL) BY MOUTH DAILY. 30 tablet 6  . atorvastatin (LIPITOR) 20 MG tablet Take 20 mg by mouth daily at 6 PM.     . benzonatate (TESSALON) 200 MG capsule Take 1 capsule (200 mg total) by mouth 3 (three) times daily as needed for cough. 100 capsule 1  . colchicine 0.6 MG tablet Take 0.6 mg by mouth daily.     . ferrous gluconate (FERGON) 240 (27 FE) MG tablet Take 240 mg by mouth 2 (two) times daily.    [name] gabapentin (NEURONTIN)  300 MG capsule Take 300 mg by mouth 3 (three) times daily.  6  . insulin NPH Human (HUMULIN N,NOVOLIN N) 100 UNIT/ML injection Inject 70 Units into the skin 2 (two) times daily before a meal.     . insulin regular (NOVOLIN R,HUMULIN R) 100 units/mL injection Inject 30 Units into the skin 2 (two) times daily before a meal.     . KLOR-CON M20 20 MEQ tablet TAKE 5 TABLETS 3 TIMES DAILY. TAKE AN EXTRA 2 TABLETS ON MON, WED, AND SAT WITH METOLAZONE 500 tablet 6  . methocarbamol (ROBAXIN) 500 MG tablet Take 1 tablet (500 mg total) by mouth 4 (four) times daily. As needed for muscle spasm 30 tablet 1  . metolazone (ZAROXOLYN) 2.5 MG tablet Take 2.38m (1 Tablet) every Monday, Wednesday, and Saturday. 16 tablet 6  . metoprolol  succinate (TOPROL-XL) 100 MG 24 hr tablet TAKE 1 TABLET (100 MG TOTAL) BY MOUTH 2 (TWO) TIMES DAILY. 60 tablet 6  . Multiple Vitamins-Minerals (CVS SPECTRAVITE ADULT 50+ PO) Take 1 tablet by mouth daily.    [name] omeprazole (PRILOSEC) 20 MG capsule Take 1 capsule (20 mg total) by mouth 2 (two) times daily before a meal. 30 capsule 5  . potassium chloride SA (K-DUR,KLOR-CON) 20 MEQ tablet Take 4 tablets (80 mEq total) by mouth 2 (two) times daily. Take an extra 2 tablets on M,W,Sa with metolazone 500 tablet 6  . pregabalin (LYRICA) 75 MG capsule Take 75 mg by mouth daily.    . sertraline (ZOLOFT) 50 MG tablet Take 50 mg by mouth daily.    [name] spironolactone (ALDACTONE) 50 MG tablet Take 1 tablet (50 mg total) by mouth 2 (two) times daily. 60 tablet 3  . tamsulosin (FLOMAX) 0.4 MG CAPS capsule TAKE 1 CAPSULE (0.4 MG TOTAL) BY MOUTH DAILY AFTER SUPPER. 30 capsule 3  . torsemide (DEMADEX) 20 MG tablet TAKE 6 TABLETS IN THE MORNING AND 5 TABLETS IN THE EVENING 330 tablet 3  . vitamin C (ASCORBIC ACID) 500 MG tablet Take 500 mg by mouth 2 (two) times daily.     No current facility-administered medications for this visit.     Allergies  Allergen Reactions  . Codeine Other (See Comments) and  Palpitations    Hurting in chest  . Diltiazem Hcl Itching  . Niacin Other (See Comments)    Felt a "severe burning sensation" Hot flashes  . Aspirin     History of Bleeding ulcers   . Diltiazem Hcl Other (See Comments) and Hives    Gets hot    Review of Systems negative except from HPI and PMH  Physical Exam BP 104/60   Pulse 70   Ht _0  (1.727 m)   Wt 277 lb 12.8 oz (126 kg)   SpO2 96%   BMI 42.24 kg/m  Well developed and well nourished in no acute distress with severe HENT normal E scleral and icterus clear Neck Supple JVP flat; carotids brisk and full Clear to ausculation  Regular rate and rhythm, no murmurs gallops or rub Soft with active bowel sounds  Severe abd distension No clubbing cyanosis 1+ Edema Alert and oriented, grossly normal motor and sensory function Skin Warm and Dry    Assessment and  Plan AV junction ablation  stable  Congestive heart failure-chronic-diastolic  Atrial fibrillation-permanent  Ischemic Heart Disease prior CABG  Renal insufficiency grade 3  Pacemaker St Jude  The patient's device was interrogated.  The information was reviewed. No changes were made in the programming.     We have reprogrammed his rate response as 10% of beats >90  And with HFpEF the issue of chronotropic competence has becoming increasingly complicated and so have decreased effort   Walked with him and was better at fixed HR 80 Decreased slope 10>>8, increased thershold -0.5>>+0.5 and decreased max HR 130>>115

## 2016-10-03 ENCOUNTER — Other Ambulatory Visit (INDEPENDENT_AMBULATORY_CARE_PROVIDER_SITE_OTHER): Payer: Self-pay | Admitting: *Deleted

## 2016-10-03 DIAGNOSIS — D649 Anemia, unspecified: Secondary | ICD-10-CM

## 2016-10-05 ENCOUNTER — Telehealth (HOSPITAL_COMMUNITY): Payer: Self-pay

## 2016-10-05 NOTE — Telephone Encounter (Signed)
Patient scheduled for paracentesis next available at [facility] this Friday at 26. Advised if s/s worsen (SOB) to call CHF clinic or go to ED afterhours. Patient aware and agreeable.  Renee Pain, RN

## 2016-10-07 ENCOUNTER — Telehealth (INDEPENDENT_AMBULATORY_CARE_PROVIDER_SITE_OTHER): Payer: Self-pay | Admitting: *Deleted

## 2016-10-07 ENCOUNTER — Ambulatory Visit (HOSPITAL_COMMUNITY)
Admission: RE | Admit: 2016-10-07 | Discharge: 2016-10-07 | Disposition: A | Discharge status: Home / Self Care | Payer: Medicare Other | Source: Ambulatory Visit | Attending: Cardiology | Admitting: Cardiology

## 2016-10-07 DIAGNOSIS — R188 Other ascites: Secondary | ICD-10-CM | POA: Insufficient documentation

## 2016-10-07 DIAGNOSIS — K7460 Unspecified cirrhosis of liver: Secondary | ICD-10-CM | POA: Diagnosis not present

## 2016-10-07 DIAGNOSIS — D649 Anemia, unspecified: Secondary | ICD-10-CM | POA: Diagnosis not present

## 2016-10-07 NOTE — Progress Notes (Signed)
Paracentesis complete no signs of distress. 5.1L yellow colored ascites removed.

## 2016-10-07 NOTE — Procedures (Signed)
PreOperative Dx: Cirrhosis, ascites Postoperative Dx: Cirrhosis, ascites Procedure:   US guided paracentesis Radiologist:  Thornton Papas Anesthesia:  10 ml of1% lidocaine Specimen:  5.1 L of yellow ascitic fluid EBL:   < 1 ml Complications: None

## 2016-10-07 NOTE — Telephone Encounter (Signed)
   Diagnosis:    Result(s)   Card 1: Positive         Completed by: Thomas Hoff, LPN   HEMOCCULT SENSA DEVELOPER: HSV#:[id number] Marguarite Arbour DATE: 2020-05   HEMOCCULT SENSA CARD:  CGB#:[id number] R   EXPIRATION DATE: 03-20   CARD CONTROL RESULTS:  POSITIVE:Positive NEGATIVE: Negative    ADDITIONAL COMMENTS: patient was called with the result. Forwarded to [place] for further recommendation.

## 2016-10-08 NOTE — Telephone Encounter (Signed)
Patient needs OV.

## 2016-10-10 DIAGNOSIS — K7460 Unspecified cirrhosis of liver: Secondary | ICD-10-CM | POA: Diagnosis not present

## 2016-10-10 DIAGNOSIS — N183 Chronic kidney disease, stage 3 (moderate): Secondary | ICD-10-CM | POA: Diagnosis not present

## 2016-10-10 DIAGNOSIS — I5030 Unspecified diastolic (congestive) heart failure: Secondary | ICD-10-CM | POA: Diagnosis not present

## 2016-10-17 ENCOUNTER — Other Ambulatory Visit (INDEPENDENT_AMBULATORY_CARE_PROVIDER_SITE_OTHER): Payer: Self-pay | Admitting: *Deleted

## 2016-10-17 ENCOUNTER — Encounter (INDEPENDENT_AMBULATORY_CARE_PROVIDER_SITE_OTHER): Payer: Self-pay | Admitting: *Deleted

## 2016-10-17 DIAGNOSIS — D649 Anemia, unspecified: Secondary | ICD-10-CM

## 2016-10-18 ENCOUNTER — Other Ambulatory Visit (HOSPITAL_COMMUNITY): Payer: Self-pay | Admitting: Cardiology

## 2016-10-18 NOTE — Telephone Encounter (Signed)
Patient has an appointment for 12/20/16 at 9:30am with you.  Is this appointment okay or would you like him to come in sooner?

## 2016-10-19 DIAGNOSIS — R5383 Other fatigue: Secondary | ICD-10-CM | POA: Diagnosis not present

## 2016-10-19 DIAGNOSIS — K921 Melena: Secondary | ICD-10-CM | POA: Diagnosis not present

## 2016-10-19 NOTE — Telephone Encounter (Signed)
Patient called, stated that Dr. Laural Golden found blood in his stool and wanted to know if he had decided what he's going to do.  Patient has an appointment for 12/20/16 at 9:30am with Dr. Laural Golden, but not sure if he needs an earlier appointment or not.  [phone]

## 2016-10-20 ENCOUNTER — Encounter (INDEPENDENT_AMBULATORY_CARE_PROVIDER_SITE_OTHER): Payer: Self-pay | Admitting: *Deleted

## 2016-10-20 ENCOUNTER — Telehealth (INDEPENDENT_AMBULATORY_CARE_PROVIDER_SITE_OTHER): Payer: Self-pay | Admitting: *Deleted

## 2016-10-20 ENCOUNTER — Other Ambulatory Visit (INDEPENDENT_AMBULATORY_CARE_PROVIDER_SITE_OTHER): Payer: Self-pay | Admitting: *Deleted

## 2016-10-20 DIAGNOSIS — K921 Melena: Secondary | ICD-10-CM

## 2016-10-20 MED ORDER — PEG 3350-KCL-NA BICARB-NACL 420 G PO SOLR: 4000.0000 mL | Freq: Once | ORAL | 0 refills | Status: AC

## 2016-10-20 NOTE — Telephone Encounter (Signed)
I have cancelled the May office visit.

## 2016-10-20 NOTE — Telephone Encounter (Signed)
Patient needs trilyte 

## 2016-10-20 NOTE — Patient Instructions (Signed)
[name]  10/20/2016     @PREFPERIOPPHARMACY @   Your procedure is scheduled on  10/28/2016.  Report to [facility] at  1130 A.M.  Call this number if you have problems the morning of surgery:  [phone]   Remember:  Do not eat food or drink liquids after midnight.  Take these medicines the morning of surgery with A SIP OF WATER  Allopurinol. neurontin, robaxin, metoprolol, lyrica, zoloft, flomax. Take 1/2 of your usual insulin dosage the night before your surgery. DO NOT take any medication for diabetes the morning of your procedure.   Do not wear jewelry, make-up or nail polish.  Do not wear lotions, powders, or perfumes, or deoderant.  Do not shave 48 hours prior to surgery.  Men may shave face and neck.  Do not bring valuables to the hospital.  [facility] is not responsible for any belongings or valuables.  Contacts, dentures or bridgework may not be worn into surgery.  Leave your suitcase in the car.  After surgery it may be brought to your room.  For patients admitted to the hospital, discharge time will be determined by your treatment team.  Patients discharged the day of surgery will not be allowed to drive home.   Name and phone number of your driver:   family Special instructions:  Follow the diet and prep instructions given to you by Dr Olevia Perches office.  Please read over the following fact sheets that you were given. Anesthesia Post-op Instructions and Care and Recovery After Surgery       Esophagogastroduodenoscopy Esophagogastroduodenoscopy (EGD) is a procedure to examine the lining of the esophagus, stomach, and first part of the small intestine (duodenum). This procedure is done to check for problems such as inflammation, bleeding, ulcers, or growths. During this procedure, a long, flexible, lighted tube with a camera attached (endoscope) is inserted down the throat. Tell a health care provider about:  Any allergies you  have.  All medicines you are taking, including vitamins, herbs, eye drops, creams, and over-the-counter medicines.  Any problems you or family members have had with anesthetic medicines.  Any blood disorders you have.  Any surgeries you have had.  Any medical conditions you have.  Whether you are pregnant or may be pregnant. What are the risks? Generally, this is a safe procedure. However, problems may occur, including:  Infection.  Bleeding.  A tear (perforation) in the esophagus, stomach, or duodenum.  Trouble breathing.  Excessive sweating.  Spasms of the larynx.  A slowed heartbeat.  Low blood pressure. What happens before the procedure?  Follow instructions from your health care provider about eating or drinking restrictions.  Ask your health care provider about:  Changing or stopping your regular medicines. This is especially important if you are taking diabetes medicines or blood thinners.  Taking medicines such as aspirin and ibuprofen. These medicines can thin your blood. Do not take these medicines before your procedure if your health care provider instructs you not to.  Plan to have someone take you home after the procedure.  If you wear dentures, be ready to remove them before the procedure. What happens during the procedure?  To reduce your risk of infection, your health care team will wash or sanitize their hands.  An IV tube will be put in a vein in your hand or arm. You will get medicines and fluids through this tube.  You will  be given one or more of the following:  A medicine to help you relax (sedative).  A medicine to numb the area (local anesthetic). This medicine may be sprayed into your throat. It will make you feel more comfortable and keep you from gagging or coughing during the procedure.  A medicine for pain.  A mouth guard may be placed in your mouth to protect your teeth and to keep you from biting on the endoscope.  You will be  asked to lie on your left side.  The endoscope will be lowered down your throat into your esophagus, stomach, and duodenum.  Air will be put into the endoscope. This will help your health care provider see better.  The lining of your esophagus, stomach, and duodenum will be examined.  Your health care provider may:  Take a tissue sample so it can be looked at in a lab (biopsy).  Remove growths.  Remove objects (foreign bodies) that are stuck.  Treat any bleeding with medicines or other devices that stop tissue from bleeding.  Widen (dilate) or stretch narrowed areas of your esophagus and stomach.  The endoscope will be taken out. The procedure may vary among health care providers and hospitals. What happens after the procedure?  Your blood pressure, heart rate, breathing rate, and blood oxygen level will be monitored often until the medicines you were given have worn off.  Do not eat or drink anything until the numbing medicine has worn off and your gag reflex has returned. This information is not intended to replace advice given to you by your health care provider. Make sure you discuss any questions you have with your health care provider. Document Released: 11/11/2004 Document Revised: 12/17/2015 Document Reviewed: 06/04/2015 Elsevier Interactive Patient Education  2017 [place]. Esophagogastroduodenoscopy, Care After Refer to this sheet in the next few weeks. These instructions provide you with information about caring for yourself after your procedure. Your health care provider may also give you more specific instructions. Your treatment has been planned according to current medical practices, but problems sometimes occur. Call your health care provider if you have any problems or questions after your procedure. What can I expect after the procedure? After the procedure, it is common to have:  A sore throat.  Nausea.  Bloating.  Dizziness.  Fatigue. Follow these  instructions at home:  Do not eat or drink anything until the numbing medicine (local anesthetic) has worn off and your gag reflex has returned. You will know that the local anesthetic has worn off when you can swallow comfortably.  Do not drive for 24 hours if you received a medicine to help you relax (sedative).  If your health care provider took a tissue sample for testing during the procedure, make sure to get your test results. This is your responsibility. Ask your health care provider or the department performing the test when your results will be ready.  Keep all follow-up visits as told by your health care provider. This is important. Contact a health care provider if:  You cannot stop coughing.  You are not urinating.  You are urinating less than usual. Get help right away if:  You have trouble swallowing.  You cannot eat or drink.  You have throat or chest pain that gets worse.  You are dizzy or light-headed.  You faint.  You have nausea or vomiting.  You have chills.  You have a fever.  You have severe abdominal pain.  You have black, tarry, or  bloody stools. This information is not intended to replace advice given to you by your health care provider. Make sure you discuss any questions you have with your health care provider. Document Released: 06/27/2012 Document Revised: 12/17/2015 Document Reviewed: 06/04/2015 Elsevier Interactive Patient Education  2017 [place].  Colonoscopy, Adult A colonoscopy is an exam to look at the entire large intestine. During the exam, a lubricated, bendable tube is inserted into the anus and then passed into the rectum, colon, and other parts of the large intestine. A colonoscopy is often done as a part of normal colorectal screening or in response to certain symptoms, such as anemia, persistent diarrhea, abdominal pain, and blood in the stool. The exam can help screen for and diagnose medical problems,  including:  Tumors.  Polyps.  Inflammation.  Areas of bleeding. Tell a health care provider about:  Any allergies you have.  All medicines you are taking, including vitamins, herbs, eye drops, creams, and over-the-counter medicines.  Any problems you or family members have had with anesthetic medicines.  Any blood disorders you have.  Any surgeries you have had.  Any medical conditions you have.  Any problems you have had passing stool. What are the risks? Generally, this is a safe procedure. However, problems may occur, including:  Bleeding.  A tear in the intestine.  A reaction to medicines given during the exam.  Infection (rare). What happens before the procedure? Eating and drinking restrictions  Follow instructions from your health care provider about eating and drinking, which may include:  A few days before the procedure - follow a low-fiber diet. Avoid nuts, seeds, dried fruit, raw fruits, and vegetables.  1-3 days before the procedure - follow a clear liquid diet. Drink only clear liquids, such as clear broth or bouillon, black coffee or tea, clear juice, clear soft drinks or sports drinks, gelatin dessert, and popsicles. Avoid any liquids that contain red or purple dye.  On the day of the procedure - do not eat or drink anything during the 2 hours before the procedure, or within the time period that your health care provider recommends. Bowel prep  If you were prescribed an oral bowel prep to clean out your colon:  Take it as told by your health care provider. Starting the day before your procedure, you will need to drink a large amount of medicated liquid. The liquid will cause you to have multiple loose stools until your stool is almost clear or light green.  If your skin or anus gets irritated from diarrhea, you may use these to relieve the irritation:  Medicated wipes, such as adult wet wipes with aloe and vitamin E.  A skin soothing-product like  petroleum jelly.  If you vomit while drinking the bowel prep, take a break for up to 60 minutes and then begin the bowel prep again. If vomiting continues and you cannot take the bowel prep without vomiting, call your health care provider. General instructions   Ask your health care provider about changing or stopping your regular medicines. This is especially important if you are taking diabetes medicines or blood thinners.  Plan to have someone take you home from the hospital or clinic. What happens during the procedure?  An IV tube may be inserted into one of your veins.  You will be given medicine to help you relax (sedative).  To reduce your risk of infection:  Your health care team will wash or sanitize their hands.  Your anal area will be  washed with soap.  You will be asked to lie on your side with your knees bent.  Your health care provider will lubricate a long, thin, flexible tube. The tube will have a camera and a light on the end.  The tube will be inserted into your anus.  The tube will be gently eased through your rectum and colon.  Air will be delivered into your colon to keep it open. You may feel some pressure or cramping.  The camera will be used to take images during the procedure.  A small tissue sample may be removed from your body to be examined under a microscope (biopsy). If any potential problems are found, the tissue will be sent to a lab for testing.  If small polyps are found, your health care provider may remove them and have them checked for cancer cells.  The tube that was inserted into your anus will be slowly removed. The procedure may vary among health care providers and hospitals. What happens after the procedure?  Your blood pressure, heart rate, breathing rate, and blood oxygen level will be monitored until the medicines you were given have worn off.  Do not drive for 24 hours after the exam.  You may have a small amount of blood in  your stool.  You may pass gas and have mild abdominal cramping or bloating due to the air that was used to inflate your colon during the exam.  It is up to you to get the results of your procedure. Ask your health care provider, or the department performing the procedure, when your results will be ready. This information is not intended to replace advice given to you by your health care provider. Make sure you discuss any questions you have with your health care provider. Document Released: 07/08/2000 Document Revised: 05/11/2016 Document Reviewed: 09/22/2015 Elsevier Interactive Patient Education  2017 [employer]  Colonoscopy, Adult, Care After This sheet gives you information about how to care for yourself after your procedure. Your health care provider may also give you more specific instructions. If you have problems or questions, contact your health care provider. What can I expect after the procedure? After the procedure, it is common to have:  A small amount of blood in your stool for 24 hours after the procedure.  Some gas.  Mild abdominal cramping or bloating. Follow these instructions at home: General instructions    For the first 24 hours after the procedure:  Do not drive or use machinery.  Do not sign important documents.  Do not drink alcohol.  Do your regular daily activities at a slower pace than normal.  Eat soft, easy-to-digest foods.  Rest often.  Take over-the-counter or prescription medicines only as told by your health care provider.  It is up to you to get the results of your procedure. Ask your health care provider, or the department performing the procedure, when your results will be ready. Relieving cramping and bloating   Try walking around when you have cramps or feel bloated.  Apply heat to your abdomen as told by your health care provider. Use a heat source that your health care provider recommends, such as a moist heat pack or a heating  pad.  Place a towel between your skin and the heat source.  Leave the heat on for 20-30 minutes.  Remove the heat if your skin turns bright red. This is especially important if you are unable to feel pain, heat, or cold. You may have  a greater risk of getting burned. Eating and drinking   Drink enough fluid to keep your urine clear or pale yellow.  Resume your normal diet as instructed by your health care provider. Avoid heavy or fried foods that are hard to digest.  Avoid drinking alcohol for as long as instructed by your health care provider. Contact a health care provider if:  You have blood in your stool 2-3 days after the procedure. Get help right away if:  You have more than a small spotting of blood in your stool.  You pass large blood clots in your stool.  Your abdomen is swollen.  You have nausea or vomiting.  You have a fever.  You have increasing abdominal pain that is not relieved with medicine. This information is not intended to replace advice given to you by your health care provider. Make sure you discuss any questions you have with your health care provider. Document Released: 02/23/2004 Document Revised: 04/04/2016 Document Reviewed: 09/22/2015 Elsevier Interactive Patient Education  2017 [place] Anesthesia is a term that refers to techniques, procedures, and medicines that help a person stay safe and comfortable during a medical procedure. Monitored anesthesia care, or sedation, is one type of anesthesia. Your anesthesia specialist may recommend sedation if you will be having a procedure that does not require you to be unconscious, such as:  Cataract surgery.  A dental procedure.  A biopsy.  A colonoscopy. During the procedure, you may receive a medicine to help you relax (sedative). There are three levels of sedation:  Mild sedation. At this level, you may feel awake and relaxed. You will be able to follow  directions.  Moderate sedation. At this level, you will be sleepy. You may not remember the procedure.  Deep sedation. At this level, you will be asleep. You will not remember the procedure. The more medicine you are given, the deeper your level of sedation will be. Depending on how you respond to the procedure, the anesthesia specialist may change your level of sedation or the type of anesthesia to fit your needs. An anesthesia specialist will monitor you closely during the procedure. Let your health care provider know about:  Any allergies you have.  All medicines you are taking, including vitamins, herbs, eye drops, creams, and over-the-counter medicines.  Any use of steroids (by mouth or as a cream).  Any problems you or family members have had with sedatives and anesthetic medicines.  Any blood disorders you have.  Any surgeries you have had.  Any medical conditions you have, such as sleep apnea.  Whether you are pregnant or may be pregnant.  Any use of cigarettes, alcohol, or street drugs. What are the risks? Generally, this is a safe procedure. However, problems may occur, including:  Getting too much medicine (oversedation).  Nausea.  Allergic reaction to medicines.  Trouble breathing. If this happens, a breathing tube may be used to help with breathing. It will be removed when you are awake and breathing on your own.  Heart trouble.  Lung trouble. Before the procedure Staying hydrated  Follow instructions from your health care provider about hydration, which may include:  Up to 2 hours before the procedure - you may continue to drink clear liquids, such as water, clear fruit juice, black coffee, and plain tea. Eating and drinking restrictions  Follow instructions from your health care provider about eating and drinking, which may include:  8 hours before the procedure - stop eating heavy  meals or foods such as meat, fried foods, or fatty foods.  6 hours  before the procedure - stop eating light meals or foods, such as toast or cereal.  6 hours before the procedure - stop drinking milk or drinks that contain milk.  2 hours before the procedure - stop drinking clear liquids. Medicines  Ask your health care provider about:  Changing or stopping your regular medicines. This is especially important if you are taking diabetes medicines or blood thinners.  Taking medicines such as aspirin and ibuprofen. These medicines can thin your blood. Do not take these medicines before your procedure if your health care provider instructs you not to. Tests and exams  You will have a physical exam.  You may have blood tests done to show:  How well your kidneys and liver are working.  How well your blood can clot.  General instructions  Plan to have someone take you home from the hospital or clinic.  If you will be going home right after the procedure, plan to have someone with you for 24 hours. What happens during the procedure?  Your blood pressure, heart rate, breathing, level of pain and overall condition will be monitored.  An IV tube will be inserted into one of your veins.  Your anesthesia specialist will give you medicines as needed to keep you comfortable during the procedure. This may mean changing the level of sedation.  The procedure will be performed. After the procedure  Your blood pressure, heart rate, breathing rate, and blood oxygen level will be monitored until the medicines you were given have worn off.  Do not drive for 24 hours if you received a sedative.  You may:  Feel sleepy, clumsy, or nauseous.  Feel forgetful about what happened after the procedure.  Have a sore throat if you had a breathing tube during the procedure.  Vomit. This information is not intended to replace advice given to you by your health care provider. Make sure you discuss any questions you have with your health care provider. Document  Released: 04/06/2005 Document Revised: 12/18/2015 Document Reviewed: 11/01/2015 Elsevier Interactive Patient Education  2017 [place], Care After These instructions provide you with information about caring for yourself after your procedure. Your health care provider may also give you more specific instructions. Your treatment has been planned according to current medical practices, but problems sometimes occur. Call your health care provider if you have any problems or questions after your procedure. What can I expect after the procedure? After your procedure, it is common to:  Feel sleepy for several hours.  Feel clumsy and have poor balance for several hours.  Feel forgetful about what happened after the procedure.  Have poor judgment for several hours.  Feel nauseous or vomit.  Have a sore throat if you had a breathing tube during the procedure. Follow these instructions at home: For at least 24 hours after the procedure:    Do not:  Participate in activities in which you could fall or become injured.  Drive.  Use heavy machinery.  Drink alcohol.  Take sleeping pills or medicines that cause drowsiness.  Make important decisions or sign legal documents.  Take care of children on your own.  Rest. Eating and drinking   Follow the diet that is recommended by your health care provider.  If you vomit, drink water, juice, or soup when you can drink without vomiting.  Make sure you have little or no nausea before  eating solid foods. General instructions   Have a responsible adult stay with you until you are awake and alert.  Take over-the-counter and prescription medicines only as told by your health care provider.  If you smoke, do not smoke without supervision.  Keep all follow-up visits as told by your health care provider. This is important. Contact a health care provider if:  You keep feeling nauseous or you keep  vomiting.  You feel light-headed.  You develop a rash.  You have a fever. Get help right away if:  You have trouble breathing. This information is not intended to replace advice given to you by your health care provider. Make sure you discuss any questions you have with your health care provider. Document Released: 11/01/2015 Document Revised: 03/02/2016 Document Reviewed: 11/01/2015 Elsevier Interactive Patient Education  2017 [employer].

## 2016-10-20 NOTE — Telephone Encounter (Signed)
Procedures have been orderd. [name] will get the date and time then call the patient back.

## 2016-10-20 NOTE — Telephone Encounter (Signed)
TCS/EGD sch'd 10/28/16 at 1255 & preop 4/4 @ 245, patient and his wife aware, instructions faxed to [facility] in [place] for patient to pick up

## 2016-10-20 NOTE — Telephone Encounter (Signed)
Patient needs EGD and colonoscopy and not an office visit. He will need examination under MAC.

## 2016-10-24 ENCOUNTER — Other Ambulatory Visit (HOSPITAL_COMMUNITY): Payer: Medicare Other

## 2016-10-24 ENCOUNTER — Other Ambulatory Visit (HOSPITAL_COMMUNITY): Payer: Self-pay | Admitting: Cardiology

## 2016-10-26 ENCOUNTER — Encounter (HOSPITAL_COMMUNITY)
Admission: RE | Admit: 2016-10-26 | Discharge: 2016-10-26 | Disposition: A | Discharge status: Home / Self Care | Payer: Medicare Other | Source: Ambulatory Visit | Attending: Internal Medicine | Admitting: Internal Medicine

## 2016-10-26 ENCOUNTER — Encounter (HOSPITAL_COMMUNITY): Payer: Self-pay

## 2016-10-26 ENCOUNTER — Ambulatory Visit: Payer: Medicare Other | Admitting: Internal Medicine

## 2016-10-26 DIAGNOSIS — K921 Melena: Secondary | ICD-10-CM | POA: Diagnosis not present

## 2016-10-26 DIAGNOSIS — D1779 Benign lipomatous neoplasm of other sites: Secondary | ICD-10-CM | POA: Diagnosis not present

## 2016-10-26 DIAGNOSIS — I4891 Unspecified atrial fibrillation: Secondary | ICD-10-CM | POA: Diagnosis not present

## 2016-10-26 DIAGNOSIS — M1990 Unspecified osteoarthritis, unspecified site: Secondary | ICD-10-CM | POA: Diagnosis not present

## 2016-10-26 DIAGNOSIS — I5032 Chronic diastolic (congestive) heart failure: Secondary | ICD-10-CM | POA: Diagnosis not present

## 2016-10-26 DIAGNOSIS — I110 Hypertensive heart disease with heart failure: Secondary | ICD-10-CM | POA: Diagnosis not present

## 2016-10-26 DIAGNOSIS — D500 Iron deficiency anemia secondary to blood loss (chronic): Secondary | ICD-10-CM | POA: Diagnosis not present

## 2016-10-26 DIAGNOSIS — K317 Polyp of stomach and duodenum: Secondary | ICD-10-CM | POA: Diagnosis not present

## 2016-10-26 DIAGNOSIS — Z951 Presence of aortocoronary bypass graft: Secondary | ICD-10-CM | POA: Diagnosis not present

## 2016-10-26 DIAGNOSIS — Z01812 Encounter for preprocedural laboratory examination: Secondary | ICD-10-CM | POA: Diagnosis not present

## 2016-10-26 DIAGNOSIS — J449 Chronic obstructive pulmonary disease, unspecified: Secondary | ICD-10-CM | POA: Diagnosis not present

## 2016-10-26 DIAGNOSIS — Z6838 Body mass index (BMI) 38.0-38.9, adult: Secondary | ICD-10-CM | POA: Diagnosis not present

## 2016-10-26 DIAGNOSIS — K219 Gastro-esophageal reflux disease without esophagitis: Secondary | ICD-10-CM | POA: Diagnosis not present

## 2016-10-26 DIAGNOSIS — K644 Residual hemorrhoidal skin tags: Secondary | ICD-10-CM | POA: Diagnosis not present

## 2016-10-26 DIAGNOSIS — K31819 Angiodysplasia of stomach and duodenum without bleeding: Secondary | ICD-10-CM | POA: Diagnosis not present

## 2016-10-26 DIAGNOSIS — Z87891 Personal history of nicotine dependence: Secondary | ICD-10-CM | POA: Diagnosis not present

## 2016-10-26 DIAGNOSIS — K766 Portal hypertension: Secondary | ICD-10-CM | POA: Diagnosis not present

## 2016-10-26 DIAGNOSIS — E785 Hyperlipidemia, unspecified: Secondary | ICD-10-CM | POA: Diagnosis not present

## 2016-10-26 DIAGNOSIS — I8500 Esophageal varices without bleeding: Secondary | ICD-10-CM | POA: Diagnosis not present

## 2016-10-26 DIAGNOSIS — E119 Type 2 diabetes mellitus without complications: Secondary | ICD-10-CM | POA: Diagnosis not present

## 2016-10-26 DIAGNOSIS — I2510 Atherosclerotic heart disease of native coronary artery without angina pectoris: Secondary | ICD-10-CM | POA: Diagnosis not present

## 2016-10-26 DIAGNOSIS — I350 Nonrheumatic aortic (valve) stenosis: Secondary | ICD-10-CM | POA: Diagnosis not present

## 2016-10-26 DIAGNOSIS — G4733 Obstructive sleep apnea (adult) (pediatric): Secondary | ICD-10-CM | POA: Diagnosis not present

## 2016-10-26 LAB — BASIC METABOLIC PANEL
ANION GAP: 10 (ref 5–15)
BUN: 76 mg/dL — ABNORMAL HIGH (ref 6–20)
CALCIUM: 9.7 mg/dL (ref 8.9–10.3)
CHLORIDE: 97 mmol/L — AB (ref 101–111)
CO2: 24 mmol/L (ref 22–32)
Creatinine, Ser: 2.62 mg/dL — ABNORMAL HIGH (ref 0.61–1.24)
GFR calc Af Amer: 27 mL/min — ABNORMAL LOW (ref >60)
GFR calc non Af Amer: 23 mL/min — ABNORMAL LOW (ref >60)
GLUCOSE: 129 mg/dL — AB (ref 65–99)
Potassium: 5.5 mmol/L — ABNORMAL HIGH (ref 3.5–5.1)
Sodium: 131 mmol/L — ABNORMAL LOW (ref 135–145)

## 2016-10-26 LAB — CBC
HEMATOCRIT: 33.4 % — AB (ref 39.0–52.0)
Hemoglobin: 10.2 g/dL — ABNORMAL LOW (ref 13.0–17.0)
MCH: 25.2 pg — ABNORMAL LOW (ref 26.0–34.0)
MCHC: 30.5 g/dL (ref 30.0–36.0)
MCV: 82.7 fL (ref 78.0–100.0)
Platelets: 117 10*3/uL — ABNORMAL LOW (ref 150–400)
RBC: 4.04 MIL/uL — ABNORMAL LOW (ref 4.22–5.81)
RDW: 18.7 % — AB (ref 11.5–15.5)
WBC: 7.6 10*3/uL (ref 4.0–10.5)

## 2016-10-28 ENCOUNTER — Ambulatory Visit (HOSPITAL_COMMUNITY)
Admission: RE | Admit: 2016-10-28 | Discharge: 2016-10-28 | Disposition: A | Discharge status: Home / Self Care | Payer: Medicare Other | Source: Ambulatory Visit | Attending: Internal Medicine | Admitting: Internal Medicine

## 2016-10-28 ENCOUNTER — Encounter (HOSPITAL_COMMUNITY)
Admission: RE | Disposition: A | Discharge status: Home / Self Care | Payer: Self-pay | Source: Ambulatory Visit | Attending: Internal Medicine

## 2016-10-28 ENCOUNTER — Ambulatory Visit (HOSPITAL_COMMUNITY): Payer: Medicare Other | Admitting: Anesthesiology

## 2016-10-28 ENCOUNTER — Encounter (HOSPITAL_COMMUNITY): Payer: Self-pay | Admitting: *Deleted

## 2016-10-28 ENCOUNTER — Telehealth (HOSPITAL_COMMUNITY): Payer: Self-pay | Admitting: *Deleted

## 2016-10-28 DIAGNOSIS — K766 Portal hypertension: Secondary | ICD-10-CM

## 2016-10-28 DIAGNOSIS — E785 Hyperlipidemia, unspecified: Secondary | ICD-10-CM | POA: Insufficient documentation

## 2016-10-28 DIAGNOSIS — D500 Iron deficiency anemia secondary to blood loss (chronic): Secondary | ICD-10-CM | POA: Insufficient documentation

## 2016-10-28 DIAGNOSIS — D175 Benign lipomatous neoplasm of intra-abdominal organs: Secondary | ICD-10-CM | POA: Diagnosis not present

## 2016-10-28 DIAGNOSIS — K644 Residual hemorrhoidal skin tags: Secondary | ICD-10-CM | POA: Diagnosis not present

## 2016-10-28 DIAGNOSIS — Z6838 Body mass index (BMI) 38.0-38.9, adult: Secondary | ICD-10-CM | POA: Insufficient documentation

## 2016-10-28 DIAGNOSIS — Z79899 Other long term (current) drug therapy: Secondary | ICD-10-CM | POA: Insufficient documentation

## 2016-10-28 DIAGNOSIS — K254 Chronic or unspecified gastric ulcer with hemorrhage: Secondary | ICD-10-CM | POA: Diagnosis not present

## 2016-10-28 DIAGNOSIS — K31819 Angiodysplasia of stomach and duodenum without bleeding: Secondary | ICD-10-CM

## 2016-10-28 DIAGNOSIS — I350 Nonrheumatic aortic (valve) stenosis: Secondary | ICD-10-CM | POA: Diagnosis not present

## 2016-10-28 DIAGNOSIS — K219 Gastro-esophageal reflux disease without esophagitis: Secondary | ICD-10-CM | POA: Insufficient documentation

## 2016-10-28 DIAGNOSIS — K921 Melena: Secondary | ICD-10-CM | POA: Diagnosis not present

## 2016-10-28 DIAGNOSIS — I8500 Esophageal varices without bleeding: Secondary | ICD-10-CM | POA: Insufficient documentation

## 2016-10-28 DIAGNOSIS — I2510 Atherosclerotic heart disease of native coronary artery without angina pectoris: Secondary | ICD-10-CM | POA: Diagnosis not present

## 2016-10-28 DIAGNOSIS — I4891 Unspecified atrial fibrillation: Secondary | ICD-10-CM | POA: Insufficient documentation

## 2016-10-28 DIAGNOSIS — I110 Hypertensive heart disease with heart failure: Secondary | ICD-10-CM | POA: Insufficient documentation

## 2016-10-28 DIAGNOSIS — I5032 Chronic diastolic (congestive) heart failure: Secondary | ICD-10-CM | POA: Insufficient documentation

## 2016-10-28 DIAGNOSIS — Z951 Presence of aortocoronary bypass graft: Secondary | ICD-10-CM | POA: Insufficient documentation

## 2016-10-28 DIAGNOSIS — D1779 Benign lipomatous neoplasm of other sites: Secondary | ICD-10-CM | POA: Diagnosis not present

## 2016-10-28 DIAGNOSIS — J449 Chronic obstructive pulmonary disease, unspecified: Secondary | ICD-10-CM | POA: Insufficient documentation

## 2016-10-28 DIAGNOSIS — Z794 Long term (current) use of insulin: Secondary | ICD-10-CM | POA: Insufficient documentation

## 2016-10-28 DIAGNOSIS — Z87891 Personal history of nicotine dependence: Secondary | ICD-10-CM | POA: Insufficient documentation

## 2016-10-28 DIAGNOSIS — I509 Heart failure, unspecified: Secondary | ICD-10-CM

## 2016-10-28 DIAGNOSIS — E119 Type 2 diabetes mellitus without complications: Secondary | ICD-10-CM | POA: Insufficient documentation

## 2016-10-28 DIAGNOSIS — R195 Other fecal abnormalities: Secondary | ICD-10-CM | POA: Diagnosis not present

## 2016-10-28 DIAGNOSIS — K3189 Other diseases of stomach and duodenum: Secondary | ICD-10-CM | POA: Diagnosis not present

## 2016-10-28 DIAGNOSIS — Z01812 Encounter for preprocedural laboratory examination: Secondary | ICD-10-CM | POA: Diagnosis not present

## 2016-10-28 DIAGNOSIS — G4733 Obstructive sleep apnea (adult) (pediatric): Secondary | ICD-10-CM | POA: Insufficient documentation

## 2016-10-28 DIAGNOSIS — K317 Polyp of stomach and duodenum: Secondary | ICD-10-CM | POA: Insufficient documentation

## 2016-10-28 DIAGNOSIS — M1990 Unspecified osteoarthritis, unspecified site: Secondary | ICD-10-CM | POA: Insufficient documentation

## 2016-10-28 DIAGNOSIS — I5022 Chronic systolic (congestive) heart failure: Secondary | ICD-10-CM

## 2016-10-28 HISTORY — PX: COLONOSCOPY WITH PROPOFOL: SHX5780

## 2016-10-28 HISTORY — PX: POLYPECTOMY: SHX5525

## 2016-10-28 HISTORY — PX: ESOPHAGOGASTRODUODENOSCOPY (EGD) WITH PROPOFOL: SHX5813

## 2016-10-28 LAB — GLUCOSE, CAPILLARY
GLUCOSE-CAPILLARY: 113 mg/dL — AB (ref 65–99)
Glucose-Capillary: 102 mg/dL — ABNORMAL HIGH (ref 65–99)

## 2016-10-28 SURGERY — ESOPHAGOGASTRODUODENOSCOPY (EGD) WITH PROPOFOL
Anesthesia: Monitor Anesthesia Care

## 2016-10-28 MED ORDER — CHLORHEXIDINE GLUCONATE CLOTH 2 % EX PADS: 6.0000 | MEDICATED_PAD | Freq: Once | CUTANEOUS | Status: DC

## 2016-10-28 MED ORDER — LACTATED RINGERS IV SOLN: INTRAVENOUS | Status: DC

## 2016-10-28 MED ORDER — PROPOFOL 10 MG/ML IV BOLUS
INTRAVENOUS | Status: AC
  Filled 2016-10-28: qty 20

## 2016-10-28 MED ORDER — PROPOFOL 500 MG/50ML IV EMUL
INTRAVENOUS | Status: DC | PRN
  Administered 2016-10-28: 13:00:00 via INTRAVENOUS
  Administered 2016-10-28: 125 ug/kg/min via INTRAVENOUS
  Administered 2016-10-28: 13:00:00 via INTRAVENOUS

## 2016-10-28 MED ORDER — MIDAZOLAM HCL 2 MG/2ML IJ SOLN
INTRAMUSCULAR | Status: AC
  Filled 2016-10-28: qty 2

## 2016-10-28 MED ORDER — SODIUM CHLORIDE 0.9 % IV SOLN
INTRAVENOUS | Status: DC
  Administered 2016-10-28: 11:00:00 via INTRAVENOUS

## 2016-10-28 MED ORDER — FENTANYL CITRATE (PF) 100 MCG/2ML IJ SOLN
INTRAMUSCULAR | Status: AC
  Filled 2016-10-28: qty 2

## 2016-10-28 MED ORDER — MIDAZOLAM HCL 5 MG/5ML IJ SOLN
INTRAMUSCULAR | Status: DC | PRN
  Administered 2016-10-28: 2 mg via INTRAVENOUS

## 2016-10-28 MED ORDER — LIDOCAINE VISCOUS 2 % MT SOLN
6.0000 mL | Freq: Once | OROMUCOSAL | Status: AC
  Administered 2016-10-28: 6 mL via OROMUCOSAL

## 2016-10-28 MED ORDER — FENTANYL CITRATE (PF) 100 MCG/2ML IJ SOLN
25.0000 ug | INTRAMUSCULAR | Status: AC
  Administered 2016-10-28 (×2): 25 ug via INTRAVENOUS

## 2016-10-28 MED ORDER — MIDAZOLAM HCL 2 MG/2ML IJ SOLN
1.0000 mg | INTRAMUSCULAR | Status: AC
  Administered 2016-10-28: 2 mg via INTRAVENOUS

## 2016-10-28 MED ORDER — LIDOCAINE VISCOUS 2 % MT SOLN
OROMUCOSAL | Status: AC
  Filled 2016-10-28: qty 15

## 2016-10-28 NOTE — H&P (Signed)
[name] is an 71 y.o. male.   Chief Complaint: Patient is here for EGD and colonoscopy. HPI: Patient is 71 year old Caucasian male with multiple medical problems including cirrhosis secondary to NASH history of colonic polyps as well as iron deficiency anemia. He had blood work 1 month when [name] hemoglobin was 9.7 and [name] stool was guaiac positive. He was begun on not in. He does have a history of colonic adenomas and [name] last colonoscopy was over 5 years ago. Last EGD was about 3 years ago in [facility] and he did not have varices. Family history is negative for CRC.  Past Medical History:  Diagnosis Date  . Aortic stenosis 03/08/2012   a.  s/p tissue AVR 10/13 with Dr. [name];   b. Echo 10/13: mod LVH, EF 55-60%, tissue AVR not well seen, no leak, gradient not too high (mean 60mHg), MAC, mild MR, mild LAE, PASP 38  . Ascites    status post paracentesis with removal of 3.4 L of ascitic fluid  . Atrial fibrillation ([place]    Permanent; off of Coumadin for now due to GI bleed  . AVM (arteriovenous malformation)    Recurrent GI bleeding requiring multiple transfusions  . CAD (coronary artery disease)    a. s/p CABG 2004;  [place]  LHC 8/13: Free radial to obtuse marginal patent, SVG-diagonal patent, LIMA-LAD patent, EF 65-70%, mean aortic valve gradient 42  . Carotid bruit 06/14/2011   a. pre-AVR dopplers 10/13: no sig ICA stenosis  . Chronic diastolic heart failure ([place]   . Cirrhosis ([place]   . COPD (chronic obstructive pulmonary disease) ([place]   . DM2 (diabetes mellitus, type 2) (HCC)    at least 10 yrs  . GERD (gastroesophageal reflux disease)   . H/O hiatal hernia   . Hyperlipidemia   . Hypertension    x 15 yrs  . Insomnia   . Iron deficiency anemia    Requiring intravenous iron  . Mediastinal adenopathy 09/22/2011  . OSA (obstructive sleep apnea) 1999    USES CPAP  . Osteoarthritis   . Overweight(278.02)   . Thrombocytopenia ([place]     Past  Surgical History:  Procedure Laterality Date  . ABLATION  04-18-14   AVN ablation by Dr KCaryl Comes . AORTIC VALVE REPLACEMENT  04/25/2012   Procedure: AORTIC VALVE REPLACEMENT (AVR);  [name]: SMelrose Nakayama MD;  [place]  Service: Open Heart Surgery;  Laterality: N/A;  . AV NODE ABLATION N/A 04/18/2014   Procedure: AV NODE ABLATION;  [name]: SDeboraha Sprang MD;  [facility];  Service: Cardiovascular;  Laterality: N/A;  . CARPAL TUNNEL RELEASE    10/08/2003  . CATARACT EXTRACTION W/ INTRAOCULAR LENS IMPLANT Bilateral 09/28/2015 , 10/19/2015  . CORONARY ANGIOPLASTY WITH STENT PLACEMENT  01/19/2005   drug eluting stent to high grade ostial stenosis of radial artery graft to OM  . CORONARY ARTERY BYPASS GRAFT   10/15/2002    SRevonda Standard HRoxan Hockey M.D.     . ESOPHAGOGASTRODUODENOSCOPY N/A 07/31/2013   Procedure: ESOPHAGOGASTRODUODENOSCOPY (EGD);  [name]: DMilus Banister MD;  [place]  Service: Endoscopy;  Laterality: N/A;  . ESOPHAGOGASTRODUODENOSCOPY Left 01/11/2014   Procedure: ESOPHAGOGASTRODUODENOSCOPY (EGD);  [name]: JJuanita Craver MD;  [facility];  Service: Endoscopy;  Laterality: Left;  . HERNIA REPAIR    . KNEE ARTHROSCOPY Right 01/20/2016   Procedure: ARTHROSCOPY RIGHT KNEE WITH MENSICAL DEBRIDEMENT;  [name]: FGaynelle Arabian MD;  [facility];  Service: Orthopedics;  Laterality: Right;  . LEFT AND RIGHT HEART CATHETERIZATION WITH CORONARY/GRAFT ANGIOGRAM N/A 03/07/2012   Procedure: LEFT AND RIGHT HEART CATHETERIZATION WITH Beatrix Fetters;  [name]: Sherren Mocha, MD;  Location: [facility] C[facility] LAB;  Service: Cardiovascular;  Laterality: N/A;  . lipoma surgery    . OTHER SURGICAL HISTORY  08/26/2011   [facility],  enteroscopy , revealing "three-four AVMs."   . PACEMAKER INSERTION  03-24-14   STJ Assurity single chamber pacemaker implanted by Dr Caryl Comes  . PARACENTESIS  12/2015  . PERMANENT PACEMAKER INSERTION N/A 03/24/2014   Procedure:  PERMANENT PACEMAKER INSERTION;  [name]: Deboraha Sprang, MD;  Location: [facility] CATH LAB;  Service: Cardiovascular;  Laterality: N/A;  . RIGHT HEART CATHETERIZATION N/A 01/01/2014   Procedure: RIGHT HEART CATH;  [name]: Jolaine Artist, MD;  Location: [facility] CATH LAB;  Service: Cardiovascular;  Laterality: N/A;  . TEE WITHOUT CARDIOVERSION  03/07/2012   Procedure: TRANSESOPHAGEAL ECHOCARDIOGRAM (TEE);  [name]: Thayer Headings, MD;  Location: [facility] ENDOSCOPY;  Service: Cardiovascular;  Laterality: N/A;    Family History  Problem Relation Age of Onset  . Hypertension Father   . Diabetes Father   . Heart disease Father   . COPD Sister   . Cancer Maternal Aunt     Breast cancer   . Cancer Maternal Aunt     Breast cancer   . Diabetes Son   . Cancer Daughter     Cervical cancer  . Coronary artery disease      FAMILY HISTORY   Social History:  reports that he quit smoking about 16 years ago. [name] smoking use included Cigarettes. He has a 30.00 pack-year smoking history. He has never used smokeless tobacco. He reports that he does not drink alcohol or use drugs.  Allergies:  Allergies  Allergen Reactions  . Codeine Other (See Comments) and Palpitations    Hurting in chest  . Diltiazem Hcl Itching  . Niacin Other (See Comments)    Felt a "severe burning sensation" Hot flashes  . Aspirin     History of Bleeding ulcers   . Diltiazem Hcl Other (See Comments) and Hives    Gets hot    Medications Prior to Admission  Medication Sig Dispense Refill  . acetaminophen (TYLENOL) 500 MG tablet Take 1,000 mg by mouth at bedtime.     [name] allopurinol (ZYLOPRIM) 100 MG tablet TAKE 1 TABLET DAILY 30 tablet 6  . atorvastatin (LIPITOR) 20 MG tablet Take 20 mg by mouth daily at 6 PM.     . colchicine 0.6 MG tablet Take 0.6 mg by mouth daily.     . ferrous gluconate (FERGON) 324 MG tablet Take 324 mg by mouth 2 (two) times daily with a meal.    . gabapentin (NEURONTIN) 300 MG capsule Take 300 mg by mouth 3  (three) times daily.  6  . insulin NPH Human (HUMULIN N,NOVOLIN N) 100 UNIT/ML injection Inject 65 Units into the skin 2 (two) times daily before a meal.     . insulin regular (NOVOLIN R,HUMULIN R) 100 units/mL injection Inject 30 Units into the skin 2 (two) times daily before a meal.     . metolazone (ZAROXOLYN) 2.5 MG tablet Take 2.59m (1 Tablet) every Monday, Wednesday, and Saturday. (Patient taking differently: Take 2.5 mg by mouth See admin instructions. Take 2.552m(1 Tablet) every Monday, Wednesday, and Saturday.) 16 tablet 6  . metoprolol succinate (TOPROL-XL) 100 MG 24 hr tablet TAKE 1 TABLET (100 MG TOTAL) BY  MOUTH 2 (TWO) TIMES DAILY. 60 tablet 6  . Multiple Vitamins-Minerals ([employer] SPECTRAVITE ADULT 50+ PO) Take 1 tablet by mouth daily.    [name] omeprazole (PRILOSEC) 20 MG capsule Take 1 capsule (20 mg total) by mouth 2 (two) times daily before a meal. 30 capsule 5  . potassium chloride SA (K-DUR,KLOR-CON) 20 MEQ tablet Take 4 tablets (80 mEq total) by mouth 2 (two) times daily. Take an extra 2 tablets on M,W,Sa with metolazone (Patient taking differently: Take 80-120 mEq by mouth 2 (two) times daily. Take an extra 2 tablets on M,W,Sa with metolazone) 500 tablet 6  . pregabalin (LYRICA) 75 MG capsule Take 75 mg by mouth at bedtime.     . sertraline (ZOLOFT) 50 MG tablet Take 50 mg by mouth daily.    [name] spironolactone (ALDACTONE) 50 MG tablet Take 1 tablet (50 mg total) by mouth 2 (two) times daily. 60 tablet 3  . tamsulosin (FLOMAX) 0.4 MG CAPS capsule TAKE 1 CAPSULE (0.4 MG TOTAL) BY MOUTH DAILY AFTER SUPPER. 30 capsule 3  . torsemide (DEMADEX) 20 MG tablet TAKE 6 TABLETS IN THE MORNING AND 5 TABLETS IN THE EVENING 330 tablet 3  . vitamin C (ASCORBIC ACID) 500 MG tablet Take 500 mg by mouth 2 (two) times daily.    . benzonatate (TESSALON) 200 MG capsule Take 1 capsule (200 mg total) by mouth 3 (three) times daily as needed for cough. (Patient not taking: Reported on 10/21/2016) 100 capsule 1  .  KLOR-CON M20 20 MEQ tablet TAKE 5 TABLETS 3 TIMES DAILY. TAKE AN EXTRA 2 TABLETS ON MON, WED, AND SAT WITH METOLAZONE (Patient not taking: Reported on 10/21/2016) 500 tablet 6  . methocarbamol (ROBAXIN) 500 MG tablet Take 1 tablet (500 mg total) by mouth 4 (four) times daily. As needed for muscle spasm (Patient not taking: Reported on 10/21/2016) 30 tablet 1    Results for orders placed or performed during the hospital encounter of 10/28/16 (from the past 48 hour(s))  Glucose, capillary     Status: Abnormal   Collection Time: 10/28/16 10:43 AM  Result Value Ref Range   Glucose-Capillary 102 (H) 65 - 99 mg/dL   No results found.  ROS  Blood pressure (!) 89/49, resp. rate 15, SpO2 99 %. Physical Exam  Constitutional: He appears well-developed and well-nourished.  HENT:  Mouth/Throat: Oropharynx is clear and moist.  Eyes: Conjunctivae are normal. No scleral icterus.  Neck: No thyromegaly present.  Cardiovascular:  Regular rhythm with normal S1 and prominent S2. Grade 2/6 systolic ejection murmur best heard at aortic area.  Respiratory: Effort normal and breath sounds normal.  GI:  Abdomen is protuberant but soft and nontender.  Musculoskeletal: He exhibits no edema.  Lymphadenopathy:    He has no cervical adenopathy.  Neurological: He is alert.  Skin: Skin is warm and dry.     Assessment/Plan  Iron deficiency anemia and heme positive stool. Cirrhosis and history of colonic adenomas. Diagnostic EGD with colonoscopy. If he has large esophageal varices these will be banded for primary prophylaxis. I have discussed this possibility with the patient and [name] sister who is at bedside and they're both agreeable.  Hildred Laser, MD 10/28/2016, 12:09 PM

## 2016-10-28 NOTE — Op Note (Signed)
[facility] Patient Name: [name] Procedure Date: 10/28/2016 12:41 PM MRN: [id number] Date of Birth: 1946/02/03 Attending MD: Hildred Laser , MD CSN: [id number] Age: 71 Admit Type: Outpatient Procedure:                Colonoscopy Indications:              Heme positive stool, Iron deficiency anemia                            secondary to chronic blood loss Providers:                Hildred Laser, MD, Gwenlyn Fudge RN, RN, Celso Sickle, Technician Referring MD:             [name], MD Medicines:                Propofol per Anesthesia Complications:            No immediate complications. Estimated Blood Loss:     Estimated blood loss: none. Procedure:                Pre-Anesthesia Assessment:                           - Prior to the procedure, a History and Physical                            was performed, and patient medications and                            allergies were reviewed. The patient's tolerance of                            previous anesthesia was also reviewed. The risks                            and benefits of the procedure and the sedation                            options and risks were discussed with the patient.                            All questions were answered, and informed consent                            was obtained. Prior Anticoagulants: The patient has                            taken no previous anticoagulant or antiplatelet                            agents. ASA Grade Assessment: III - A patient with  severe systemic disease. After reviewing the risks                            and benefits, the patient was deemed in                            satisfactory condition to undergo the procedure.                           After obtaining informed consent, the colonoscope                            was passed under direct vision. Throughout the                            procedure, the  patient's blood pressure, pulse, and                            oxygen saturations were monitored continuously. The                            EC-[id number] ([id number]) scope was introduced through                            the anus and advanced to the the cecum, identified                            by appendiceal orifice and ileocecal valve. The                            colonoscopy was performed without difficulty. The                            patient tolerated the procedure well. The quality                            of the bowel preparation was adequate. The                            ileocecal valve, appendiceal orifice, and rectum                            were photographed. Scope In: 12:42:26 PM Scope Out: 12:56:07 PM Scope Withdrawal Time: 0 hours 10 minutes 49 seconds  Total Procedure Duration: 0 hours 13 minutes 41 seconds  Findings:      The perianal and digital rectal examinations were normal.      There was a medium-sized lipoma, 12 mm in diameter, at the splenic       flexure.      The exam was otherwise normal throughout the examined colon.      External hemorrhoids were found during retroflexion. The hemorrhoids       were small. Impression:               - Medium-sized lipoma at the splenic flexure.                           -  External hemorrhoids.                           - No specimens collected. Moderate Sedation:      Per Anesthesia Care Recommendation:           - Patient has a contact number available for                            emergencies. The signs and symptoms of potential                            delayed complications were discussed with the                            patient. Return to normal activities tomorrow.                            Written discharge instructions were provided to the                            patient.                           - Resume previous diet today.                           - Continue present medications.                            - Repeat colonoscopy in 5 years for surveillance. Procedure Code(s):        --- Professional ---                           [id number], Colonoscopy, flexible; diagnostic, including                            collection of specimen(s) by brushing or washing,                            when performed (separate procedure) Diagnosis Code(s):        --- Professional ---                           D17.5, Benign lipomatous neoplasm of                            intra-abdominal organs                           K64.4, Residual hemorrhoidal skin tags                           R19.5, Other fecal abnormalities                           D50.0, Iron deficiency anemia secondary to blood  loss (chronic) CPT copyright 2016 American Medical Association. All rights reserved. The codes documented in this report are preliminary and upon coder review may  be revised to meet current compliance requirements. Hildred Laser, MD Hildred Laser, MD 10/28/2016 1:14:19 PM This report has been signed electronically. Number of Addenda: 0

## 2016-10-28 NOTE — Transfer of Care (Signed)
Immediate Anesthesia Transfer of Care Note  Patient: [name]  Procedure(s) Performed: Procedure(s) with comments: ESOPHAGOGASTRODUODENOSCOPY (EGD) WITH PROPOFOL (N/A) COLONOSCOPY WITH PROPOFOL (N/A) POLYPECTOMY - gastric  Patient Location: PACU  Anesthesia Type:MAC  Level of Consciousness: awake and patient cooperative  Airway & Oxygen Therapy: Patient Spontanous Breathing and Patient connected to face mask oxygen  Post-op Assessment: Report given to RN, Post -op Vital signs reviewed and stable and Patient moving all extremities  Post vital signs: Reviewed and stable  Last Vitals:  Vitals:   10/28/16 1145 10/28/16 1200  BP: (!) 93/58 (!) 97/56  Resp: 15 16    Last Pain:  Vitals:   10/28/16 1038  TempSrc: Oral      Patients Stated Pain Goal: 7 (60/45/40 9811)  Complications: No apparent anesthesia complications

## 2016-10-28 NOTE — Discharge Instructions (Signed)
No Aspirin or NSAIDs for 3 days. Resume usual medications including iron. Resume usual diet. No driving for 24 hours. Physician will call with biopsy results. H&H in one month. Office will call.  Esophagogastroduodenoscopy, Care After Refer to this sheet in the next few weeks. These instructions provide you with information about caring for yourself after your procedure. Your health care provider may also give you more specific instructions. Your treatment has been planned according to current medical practices, but problems sometimes occur. Call your health care provider if you have any problems or questions after your procedure. What can I expect after the procedure? After the procedure, it is common to have:  A sore throat.  Nausea.  Bloating.  Dizziness.  Fatigue. Follow these instructions at home:  Do not eat or drink anything until the numbing medicine (local anesthetic) has worn off and your gag reflex has returned. You will know that the local anesthetic has worn off when you can swallow comfortably.  Do not drive for 24 hours if you received a medicine to help you relax (sedative).  If your health care provider took a tissue sample for testing during the procedure, make sure to get your test results. This is your responsibility. Ask your health care provider or the department performing the test when your results will be ready.  Keep all follow-up visits as told by your health care provider. This is important. Contact a health care provider if:  You cannot stop coughing.  You are not urinating.  You are urinating less than usual. Get help right away if:  You have trouble swallowing.  You cannot eat or drink.  You have throat or chest pain that gets worse.  You are dizzy or light-headed.  You faint.  You have nausea or vomiting.  You have chills.  You have a fever.  You have severe abdominal pain.  You have black, tarry, or bloody stools. This  information is not intended to replace advice given to you by your health care provider. Make sure you discuss any questions you have with your health care provider. Document Released: 06/27/2012 Document Revised: 12/17/2015 Document Reviewed: 06/04/2015 Elsevier Interactive Patient Education  2017 Elsevier Inc.   Colonoscopy, Adult, Care After This sheet gives you information about how to care for yourself after your procedure. Your health care provider may also give you more specific instructions. If you have problems or questions, contact your health care provider. What can I expect after the procedure? After the procedure, it is common to have:  A small amount of blood in your stool for 24 hours after the procedure.  Some gas.  Mild abdominal cramping or bloating. Follow these instructions at home: General instructions    For the first 24 hours after the procedure:  Do not drive or use machinery.  Do not sign important documents.  Do not drink alcohol.  Do your regular daily activities at a slower pace than normal.  Eat soft, easy-to-digest foods.  Rest often.  Take over-the-counter or prescription medicines only as told by your health care provider.  It is up to you to get the results of your procedure. Ask your health care provider, or the department performing the procedure, when your results will be ready. Relieving cramping and bloating   Try walking around when you have cramps or feel bloated.  Apply heat to your abdomen as told by your health care provider. Use a heat source that your health care provider recommends, such as a  moist heat pack or a heating pad.  Place a towel between your skin and the heat source.  Leave the heat on for 20-30 minutes.  Remove the heat if your skin turns bright red. This is especially important if you are unable to feel pain, heat, or cold. You may have a greater risk of getting burned. Eating and drinking   Drink enough  fluid to keep your urine clear or pale yellow.  Resume your normal diet as instructed by your health care provider. Avoid heavy or fried foods that are hard to digest.  Avoid drinking alcohol for as long as instructed by your health care provider. Contact a health care provider if:  You have blood in your stool 2-3 days after the procedure. Get help right away if:  You have more than a small spotting of blood in your stool.  You pass large blood clots in your stool.  Your abdomen is swollen.  You have nausea or vomiting.  You have a fever.  You have increasing abdominal pain that is not relieved with medicine. This information is not intended to replace advice given to you by your health care provider. Make sure you discuss any questions you have with your health care provider. Document Released: 02/23/2004 Document Revised: 04/04/2016 Document Reviewed: 09/22/2015 Elsevier Interactive Patient Education  2017 Elsevier Inc.   Gastric Polyps A gastric polyp, also called a stomach polyp, is a growth on the lining of the stomach. Most polyps are not dangerous, but some can be harmful because of their size, location, or type. Polyps that can become harmful include:  Large polyps. These can turn into sores (ulcers). Ulcers can lead to stomach bleeding.  Polyps that block food from moving from the stomach to the small intestine (gastric outlet obstruction).  A type of polyp called an adenoma. This type of polyp can become cancerous. What are the causes? Gastric polyps form when the lining of the stomach gets inflamed or damaged. Stomach inflammation and damage may be caused by:  A long-lasting stomach condition, such as gastritis.  Certain medicines used to reduce stomach acid.  An inherited condition called familial adenomatous polyposis. What are the signs or symptoms? Usually, this condition does not cause any symptoms. If you do have symptoms, they may include:  Pain or  tenderness in the abdomen.  Nausea.  Trouble eating or swallowing.  Blood in the stool.  Anemia. How is this diagnosed? Gastric polyps are diagnosed with:  A medical procedure called endoscopy.  A lab test in which a part of the polyp is examined. This test is done with a sample of polyp tissue (biopsy) taken during an endoscopy. How is this treated? Treatment depends on the type, location, and size of the polyps. Treatment may involve:  Having the polyps checked regularly with an endoscopy.  Having the polyps removed with an endoscopy. This may be done if the polyps are harmful or can become harmful. Removing a polyp often prevents problems from developing.  Having the polyps removed with a surgery called a partial gastrectomy. This may be done in rare cases to remove very large polyps.  Treating the underlying condition that caused the polyps. Follow these instructions at home:  Take over-the-counter and prescription medicines only as told by your health care provider.  Keep all follow-up visits as told by your health care provider. This is important. Contact a health care provider if:  You develop new symptoms.  Your symptoms get worse. Get help right  away if:  You vomit blood.  You have severe abdominal pain.  You cannot eat or drink.  You have blood in your stool. This information is not intended to replace advice given to you by your health care provider. Make sure you discuss any questions you have with your health care provider. Document Released: 06/27/2012 Document Revised: 11/30/2015 Document Reviewed: 07/26/2015 Elsevier Interactive Patient Education  2017 [employer].

## 2016-10-28 NOTE — Telephone Encounter (Signed)
Spoke with Dr. Daisey Must from [facility] who saw patient today after having a colonoscopy done.  He wanted Dr. Aundra Dubin to be aware that patient's potassium was elevated at 5.5 and creatine slightly higher than previous labs at 2.2.   Patient instructed to hold potassium tomorrow and resume normal dose on Sunday.  Also instructed him to come in for lab work with [place] on Monday and Dr. Aundra Dubin can further evaluate if further medication changes are needed. BMET ordered and lab appointment scheduled.

## 2016-10-28 NOTE — Op Note (Signed)
[facility] Patient Name: [name] Procedure Date: 10/28/2016 11:48 AM MRN: [id number] Date of Birth: 12-26-1945 Attending MD: Hildred Laser , MD CSN: [id number] Age: 71 Admit Type: Outpatient Procedure:                Upper GI endoscopy Indications:              Iron deficiency anemia secondary to chronic blood                            loss, Heme positive stool Providers:                Hildred Laser, MD, Otis Peak B. Sharon Seller, RN, Sherlyn Lees, Technician Referring MD:             [name], MD Medicines:                Cetacaine spray, Propofol per Anesthesia Complications:            No immediate complications. Estimated Blood Loss:     Estimated blood loss: none. Procedure:                Pre-Anesthesia Assessment:                           - Prior to the procedure, a History and Physical                            was performed, and patient medications and                            allergies were reviewed. The patient's tolerance of                            previous anesthesia was also reviewed. The risks                            and benefits of the procedure and the sedation                            options and risks were discussed with the patient.                            All questions were answered, and informed consent                            was obtained. Prior Anticoagulants: The patient has                            taken no previous anticoagulant or antiplatelet                            agents. ASA Grade Assessment: III - A patient with  severe systemic disease. After reviewing the risks                            and benefits, the patient was deemed in                            satisfactory condition to undergo the procedure.                           After obtaining informed consent, the endoscope was                            passed under direct vision. Throughout the               procedure, the patient's blood pressure, pulse, and                            oxygen saturations were monitored continuously. The                            EG-[id number] ([id number]) scope was introduced through the                            mouth, and advanced to the second part of duodenum.                            The upper GI endoscopy was accomplished without                            difficulty. The patient tolerated the procedure                            well. Scope In: 12:24:51 PM Scope Out: 12:36:56 PM Total Procedure Duration: 0 hours 12 minutes 5 seconds  Findings:      Grade I varices were found in the lower third of the esophagus.      The Z-line was regular and was found 44 cm from the incisors.      A single 7 mm sessile polyp with bleeding was found in the gastric body       and on the greater curvature of the stomach. The polyp was removed with       a hot snare. Resection and retrieval were complete. The pathology       specimen was placed into Bottle Number 1.      Two 5 to 8 mm sessile polyps with bleeding were found in the prepyloric       region of the stomach. The polyp was removed with a hot snare. Resection       and retrieval were complete. The pathology specimen was placed into       Bottle Number 1.      Mild portal hypertensive gastropathy was found in the gastric fundus and       in the gastric body.      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      The duodenal bulb  and second portion of the duodenum were normal. Impression:               - Grade I esophageal varices.                           - Z-line regular, 44 cm from the incisors.                           - A single gastric polyp. Resected and retrieved.                           - Two gastric polyps. Resected and retrieved.                           - Portal hypertensive gastropathy.                           - Gastric antral  vascular ectasia without bleeding.                           - Normal duodenal bulb and second portion of the                            duodenum. Moderate Sedation:      Per Anesthesia Care Recommendation:           - Patient has a contact number available for                            emergencies. The signs and symptoms of potential                            delayed complications were discussed with the                            patient. Return to normal activities tomorrow.                            Written discharge instructions were provided to the                            patient.                           - Resume previous diet today.                           - Continue present medications.                           - No aspirin, ibuprofen, naproxen, or other                            non-steroidal anti-inflammatory drugs for 3 days  after polyp removal.                           - Await pathology results. Procedure Code(s):        --- Professional ---                           [phone], Esophagogastroduodenoscopy, flexible,                            transoral; with removal of tumor(s), polyp(s), or                            other lesion(s) by snare technique Diagnosis Code(s):        --- Professional ---                           I85.00, Esophageal varices without bleeding                           K31.7, Polyp of stomach and duodenum                           K76.6, Portal hypertension                           K31.89, Other diseases of stomach and duodenum                           K31.819, Angiodysplasia of stomach and duodenum                            without bleeding                           D50.0, Iron deficiency anemia secondary to blood                            loss (chronic)                           R19.5, Other fecal abnormalities CPT copyright 2016 American Medical Association. All rights reserved. The codes documented in this  report are preliminary and upon coder review may  be revised to meet current compliance requirements. Hildred Laser, MD Hildred Laser, MD 10/28/2016 1:10:18 PM This report has been signed electronically. Number of Addenda: 0

## 2016-10-28 NOTE — Anesthesia Postprocedure Evaluation (Signed)
Anesthesia Post Note  Patient: [name]  Procedure(s) Performed: Procedure(s) (LRB): ESOPHAGOGASTRODUODENOSCOPY (EGD) WITH PROPOFOL (N/A) COLONOSCOPY WITH PROPOFOL (N/A) POLYPECTOMY  Patient location during evaluation: PACU Anesthesia Type: MAC Level of consciousness: awake and alert, oriented and patient cooperative Pain management: pain level controlled Vital Signs Assessment: post-procedure vital signs reviewed and stable Respiratory status: spontaneous breathing, nonlabored ventilation and respiratory function stable Cardiovascular status: blood pressure returned to baseline Postop Assessment: no signs of nausea or vomiting Anesthetic complications: no     Last Vitals:  Vitals:   10/28/16 1145 10/28/16 1200  BP: (!) 93/58 (!) 97/56  Resp: 15 16    Last Pain:  Vitals:   10/28/16 1038  TempSrc: Oral                 Hridaan Bouse J

## 2016-10-28 NOTE — Anesthesia Preprocedure Evaluation (Signed)
Anesthesia Evaluation    Airway Mallampati: II  TM Distance: >3 FB     Dental  (+) Edentulous Upper, Edentulous Lower   Pulmonary shortness of breath, sleep apnea , COPD, former smoker,    breath sounds clear to auscultation       Cardiovascular hypertension, + CAD, + CABG and +CHF  + dysrhythmias + pacemaker + Valvular Problems/Murmurs (s/p AVR)  Rhythm:Regular Rate:Normal     Neuro/Psych    GI/Hepatic hiatal hernia, GERD  ,  Endo/Other  diabetes, Type 2Morbid obesity  Renal/GU Renal disease     Musculoskeletal   Abdominal   Peds  Hematology  (+) anemia ,   Anesthesia Other Findings   Reproductive/Obstetrics                             Anesthesia Physical Anesthesia Plan  ASA: III  Anesthesia Plan: MAC   Post-op Pain Management:    Induction: Intravenous  Airway Management Planned: Simple Face Mask  Additional Equipment:   Intra-op Plan:   Post-operative Plan:   Informed Consent: I have reviewed the patients History and Physical, chart, labs and discussed the procedure including the risks, benefits and alternatives for the proposed anesthesia with the patient or authorized representative who has indicated his/her understanding and acceptance.     Plan Discussed with:   Anesthesia Plan Comments:         Anesthesia Quick Evaluation

## 2016-10-29 NOTE — Telephone Encounter (Signed)
Decrease KCl to 3 tabs (60 mEq) tid.

## 2016-10-31 ENCOUNTER — Other Ambulatory Visit (INDEPENDENT_AMBULATORY_CARE_PROVIDER_SITE_OTHER): Payer: Self-pay | Admitting: *Deleted

## 2016-10-31 ENCOUNTER — Ambulatory Visit (HOSPITAL_COMMUNITY)
Admission: RE | Admit: 2016-10-31 | Discharge: 2016-10-31 | Disposition: A | Discharge status: Home / Self Care | Payer: Medicare Other | Source: Ambulatory Visit | Attending: Cardiology | Admitting: Cardiology

## 2016-10-31 DIAGNOSIS — D649 Anemia, unspecified: Secondary | ICD-10-CM

## 2016-10-31 DIAGNOSIS — I5022 Chronic systolic (congestive) heart failure: Secondary | ICD-10-CM | POA: Diagnosis not present

## 2016-10-31 LAB — BASIC METABOLIC PANEL
ANION GAP: 12 (ref 5–15)
BUN: 68 mg/dL — ABNORMAL HIGH (ref 6–20)
CHLORIDE: 95 mmol/L — AB (ref 101–111)
CO2: 24 mmol/L (ref 22–32)
Calcium: 8.9 mg/dL (ref 8.9–10.3)
Creatinine, Ser: 2.5 mg/dL — ABNORMAL HIGH (ref 0.61–1.24)
GFR calc Af Amer: 29 mL/min — ABNORMAL LOW (ref >60)
GFR calc non Af Amer: 25 mL/min — ABNORMAL LOW (ref >60)
GLUCOSE: 193 mg/dL — AB (ref 65–99)
POTASSIUM: 3.6 mmol/L (ref 3.5–5.1)
SODIUM: 131 mmol/L — AB (ref 135–145)

## 2016-10-31 MED ORDER — POTASSIUM CHLORIDE CRYS ER 20 MEQ PO TBCR: 60.0000 meq | EXTENDED_RELEASE_TABLET | Freq: Three times a day (TID) | ORAL | 6 refills | Status: DC

## 2016-10-31 NOTE — Addendum Note (Signed)
Addended by: Kennieth Rad on: 10/31/2016 09:11 AM   Modules accepted: Orders

## 2016-10-31 NOTE — Telephone Encounter (Signed)
Spoke with patient this morning and he is aware to decrease KCl to 3 Tabs (60 mEq) TID. Medication list updated.

## 2016-11-03 ENCOUNTER — Encounter (HOSPITAL_COMMUNITY): Payer: Self-pay | Admitting: Internal Medicine

## 2016-11-04 ENCOUNTER — Other Ambulatory Visit (HOSPITAL_COMMUNITY): Payer: Self-pay | Admitting: *Deleted

## 2016-11-04 MED ORDER — TAMSULOSIN HCL 0.4 MG PO CAPS: 0.4000 mg | ORAL_CAPSULE | Freq: Every day | ORAL | 3 refills | Status: DC

## 2016-11-07 ENCOUNTER — Other Ambulatory Visit (INDEPENDENT_AMBULATORY_CARE_PROVIDER_SITE_OTHER): Payer: Self-pay | Admitting: *Deleted

## 2016-11-07 DIAGNOSIS — K921 Melena: Secondary | ICD-10-CM

## 2016-11-09 ENCOUNTER — Telehealth (HOSPITAL_COMMUNITY): Payer: Self-pay | Admitting: *Deleted

## 2016-11-09 DIAGNOSIS — R188 Other ascites: Secondary | ICD-10-CM

## 2016-11-09 NOTE — Telephone Encounter (Signed)
Patient called in requesting for US Paracentesis to be scheduled for him at [facility].  Patient has a standing order from Dr. Aundra Dubin.    Order placed and patient is scheduled for this Friday April 20th @ 1:00.  Patient is aware and no further questions at this time.

## 2016-11-11 ENCOUNTER — Ambulatory Visit (HOSPITAL_COMMUNITY)
Admission: RE | Admit: 2016-11-11 | Discharge: 2016-11-11 | Disposition: A | Discharge status: Home / Self Care | Payer: Medicare Other | Source: Ambulatory Visit | Attending: Cardiology | Admitting: Cardiology

## 2016-11-11 ENCOUNTER — Encounter (HOSPITAL_COMMUNITY): Payer: Self-pay

## 2016-11-11 DIAGNOSIS — K7460 Unspecified cirrhosis of liver: Secondary | ICD-10-CM | POA: Insufficient documentation

## 2016-11-11 DIAGNOSIS — R188 Other ascites: Secondary | ICD-10-CM | POA: Insufficient documentation

## 2016-11-11 NOTE — Procedures (Signed)
IINDICATION: [Cirrhosis.  Ascites.]  EXAM: ULTRASOUND GUIDED [RIGHT ABDOMINAL] PARACENTESIS  GRIP-IR: Category: Fluids    Subcategory: [Paracentesis]    Follow-Up: [None]    MEDICATIONS: [None.]  COMPLICATIONS: [None immediate.]  PROCEDURE: Informed written consent was obtained from the patient after a discussion of the risks, benefits and alternatives to treatment. A timeout was performed prior to the initiation of the procedure.    Initial ultrasound scanning demonstrates a [large] amount of ascites within the right mid abdominal region. The right abdomen was prepped and draped in the usual sterile fashion. 1% lidocaine with epinephrine was used for local anesthesia.     Following this, a [19 gauge, 7-cm, Yueh] catheter was introduced. An ultrasound image was saved for documentation purposes. The paracentesis was performed. The catheter was removed and a dressing was applied. The patient tolerated the procedure well without immediate post procedural complication.   FINDINGS: A total of approximately [4.6 L] of [serous] fluid was removed.   IMPRESSION:  Successful ultrasound-guided paracentesis yielding [4.6] liters of peritoneal fluid.

## 2016-11-11 NOTE — Progress Notes (Signed)
Paracentesis complete no signs of distress. 4.6l yellow colored ascites removed.

## 2016-11-14 ENCOUNTER — Other Ambulatory Visit (HOSPITAL_COMMUNITY): Payer: Self-pay | Admitting: Internal Medicine

## 2016-11-14 ENCOUNTER — Other Ambulatory Visit (HOSPITAL_COMMUNITY): Payer: Self-pay | Admitting: Student

## 2016-11-18 ENCOUNTER — Other Ambulatory Visit (INDEPENDENT_AMBULATORY_CARE_PROVIDER_SITE_OTHER): Payer: Self-pay | Admitting: *Deleted

## 2016-11-18 ENCOUNTER — Encounter (INDEPENDENT_AMBULATORY_CARE_PROVIDER_SITE_OTHER): Payer: Self-pay | Admitting: *Deleted

## 2016-11-18 DIAGNOSIS — K921 Melena: Secondary | ICD-10-CM

## 2016-11-18 DIAGNOSIS — D649 Anemia, unspecified: Secondary | ICD-10-CM

## 2016-11-22 DIAGNOSIS — N183 Chronic kidney disease, stage 3 (moderate): Secondary | ICD-10-CM | POA: Diagnosis not present

## 2016-11-23 ENCOUNTER — Telehealth (HOSPITAL_COMMUNITY): Payer: Self-pay | Admitting: *Deleted

## 2016-11-23 DIAGNOSIS — R188 Other ascites: Secondary | ICD-10-CM

## 2016-11-23 NOTE — Telephone Encounter (Signed)
Patient called requesting for [place] to schedule his US paracentesis at [facility].  Patient has a standing order.  Order placed and scheduled for 11:00 am tomorrow morning.  Patient is aware and no further questions at this time.

## 2016-11-24 ENCOUNTER — Ambulatory Visit (HOSPITAL_COMMUNITY)
Admission: RE | Admit: 2016-11-24 | Discharge: 2016-11-24 | Disposition: A | Discharge status: Home / Self Care | Payer: Medicare Other | Source: Ambulatory Visit | Attending: Cardiology | Admitting: Cardiology

## 2016-11-24 DIAGNOSIS — K7581 Nonalcoholic steatohepatitis (NASH): Secondary | ICD-10-CM | POA: Insufficient documentation

## 2016-11-24 DIAGNOSIS — R188 Other ascites: Secondary | ICD-10-CM | POA: Diagnosis present

## 2016-11-24 DIAGNOSIS — K7460 Unspecified cirrhosis of liver: Secondary | ICD-10-CM | POA: Insufficient documentation

## 2016-11-24 NOTE — Sedation Documentation (Signed)
ED Provider at bedside. 

## 2016-11-24 NOTE — Discharge Instructions (Signed)
Paracentesis, Care After  Refer to this sheet in the next few weeks. These instructions provide you with information about caring for yourself after your procedure. Your health care provider may also give you more specific instructions. Your treatment has been planned according to current medical practices, but problems sometimes occur. Call your health care provider if you have any problems or questions after your procedure.  What can I expect after the procedure?  After your procedure, it is common to have a small amount of clear fluid coming from the puncture site.  Follow these instructions at home:  · Return to your normal activities as told by your health care provider. Ask your health care provider what activities are safe for you.  · Take over-the-counter and prescription medicines only as told by your health care provider.  · Do not take baths, swim, or use a hot tub until your health care provider approves.  · Follow instructions from your health care provider about:  ? How to take care of your puncture site.  ? When and how you should change your bandage (dressing).  ? When you should remove your dressing.  · Check your puncture area every day signs of infection. Watch for:  ? Redness, swelling, or pain.  ? Fluid, blood, or pus.  · Keep all follow-up visits as told by your health care provider. This is important.  Contact a health care provider if:  · You have redness, swelling, or pain at your puncture site.  · You start to have more clear fluid coming from your puncture site.  · You have blood or pus coming from your puncture site.  · You have chills.  · You have a fever.  Get help right away if:  · You develop chest pain or shortness of breath.  · You develop increasing pain, discomfort, or swelling in your abdomen.  · You feel dizzy or light-headed or you pass out.  This information is not intended to replace advice given to you by your health care provider. Make sure you discuss any questions you  have with your health care provider.  Document Released: 11/25/2014 Document Revised: 12/17/2015 Document Reviewed: 09/23/2014  Elsevier Interactive Patient Education © 2017 [employer]

## 2016-11-24 NOTE — Sedation Documentation (Signed)
Patient denies pain and is resting comfortably.  

## 2016-11-24 NOTE — Procedures (Signed)
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

## 2016-11-24 NOTE — Sedation Documentation (Signed)
Vital signs stable. 

## 2016-12-01 DIAGNOSIS — I5030 Unspecified diastolic (congestive) heart failure: Secondary | ICD-10-CM | POA: Diagnosis not present

## 2016-12-01 DIAGNOSIS — N183 Chronic kidney disease, stage 3 (moderate): Secondary | ICD-10-CM | POA: Diagnosis not present

## 2016-12-01 DIAGNOSIS — K7460 Unspecified cirrhosis of liver: Secondary | ICD-10-CM | POA: Diagnosis not present

## 2016-12-12 ENCOUNTER — Other Ambulatory Visit (HOSPITAL_COMMUNITY): Payer: Self-pay | Admitting: Cardiology

## 2016-12-12 ENCOUNTER — Encounter (HOSPITAL_COMMUNITY): Payer: Medicare Other

## 2016-12-12 ENCOUNTER — Telehealth (HOSPITAL_COMMUNITY): Payer: Self-pay | Admitting: *Deleted

## 2016-12-12 NOTE — Telephone Encounter (Signed)
Patient called to schedule paracentesis advised him to call [phone] to set up appointment

## 2016-12-13 ENCOUNTER — Ambulatory Visit: Payer: Medicare Other | Admitting: Internal Medicine

## 2016-12-13 ENCOUNTER — Telehealth (HOSPITAL_COMMUNITY): Payer: Self-pay

## 2016-12-13 NOTE — Telephone Encounter (Signed)
Patient left VM on CHF clinic triage line asking for assitance in scheduling US Paracentesis. Left return VM asking for info regarding preference to location, day, time, etc, or if ok to find next available. Advised to call us back with this info.  Renee Pain, RN

## 2016-12-14 ENCOUNTER — Encounter (HOSPITAL_COMMUNITY): Payer: Self-pay

## 2016-12-14 ENCOUNTER — Ambulatory Visit (HOSPITAL_COMMUNITY)
Admission: RE | Admit: 2016-12-14 | Discharge: 2016-12-14 | Disposition: A | Discharge status: Home / Self Care | Payer: Medicare Other | Source: Ambulatory Visit | Attending: Cardiology | Admitting: Cardiology

## 2016-12-14 DIAGNOSIS — R188 Other ascites: Secondary | ICD-10-CM

## 2016-12-14 NOTE — Progress Notes (Signed)
Paracentesis complete no signs of distress. 4L amber colored ascites removed.  

## 2016-12-14 NOTE — Procedures (Signed)
PreOperative Dx: Cirrhosis, ascites Postoperative Dx: Cirrhosis, ascites Procedure:   US guided paracentesis Radiologist:  Thornton Papas Anesthesia:  10 ml of1% lidocaine Specimen:  4.1 L of dark amber colored ascitic fluid EBL:   < 1 ml Complications: None

## 2016-12-20 ENCOUNTER — Ambulatory Visit (INDEPENDENT_AMBULATORY_CARE_PROVIDER_SITE_OTHER): Payer: Medicare Other | Admitting: Internal Medicine

## 2016-12-26 ENCOUNTER — Other Ambulatory Visit (HOSPITAL_COMMUNITY): Payer: Self-pay | Admitting: Cardiology

## 2016-12-29 ENCOUNTER — Ambulatory Visit (HOSPITAL_COMMUNITY)
Admission: RE | Admit: 2016-12-29 | Discharge: 2016-12-29 | Disposition: A | Discharge status: Home / Self Care | Payer: Medicare Other | Source: Ambulatory Visit | Attending: Cardiology | Admitting: Cardiology

## 2016-12-29 ENCOUNTER — Telehealth (HOSPITAL_COMMUNITY): Payer: Self-pay | Admitting: *Deleted

## 2016-12-29 DIAGNOSIS — R188 Other ascites: Secondary | ICD-10-CM | POA: Diagnosis not present

## 2016-12-29 NOTE — Telephone Encounter (Signed)
Patient called asking for a Paracentesis to be scheduled at [facility] tomorrow.  Patient has a standing order for this.  Called and they can only do today at 12:00.  Patient is aware and will be there today at 12:00.  Order placed.

## 2016-12-29 NOTE — Procedures (Signed)
PROCEDURE SUMMARY:  Successful US guided paracentesis from RLQ.  Yielded 3.6 L of hazy yellow fluid.  No immediate complications.  Pt tolerated well.   Specimen was not sent for labs.  Ascencion Dike PA-C 12/29/2016 1:01 PM

## 2016-12-29 NOTE — Progress Notes (Signed)
Paracentesis complete no signs of distress. 3.6L dark yellow colored ascites removed.

## 2017-01-02 ENCOUNTER — Telehealth: Payer: Self-pay | Admitting: Cardiology

## 2017-01-02 ENCOUNTER — Ambulatory Visit (INDEPENDENT_AMBULATORY_CARE_PROVIDER_SITE_OTHER): Payer: Medicare Other | Admitting: *Deleted

## 2017-01-02 DIAGNOSIS — I442 Atrioventricular block, complete: Secondary | ICD-10-CM | POA: Diagnosis not present

## 2017-01-02 NOTE — Progress Notes (Signed)
Remote pacemaker transmission.   

## 2017-01-02 NOTE — Telephone Encounter (Signed)
Spoke with pt and reminded pt of remote transmission that is due today. Pt verbalized understanding.   

## 2017-01-04 ENCOUNTER — Encounter: Payer: Self-pay | Admitting: Cardiology

## 2017-01-04 LAB — CUP PACEART REMOTE DEVICE CHECK
Battery Remaining Longevity: 125 mo
Implantable Lead Implant Date: 20150831
Implantable Lead Location: 753860
Implantable Lead Model: 1948
Implantable Pulse Generator Implant Date: 20150831
Lead Channel Impedance Value: 730 Ohm
Lead Channel Pacing Threshold Amplitude: 0.5 V
Lead Channel Pacing Threshold Pulse Width: 0.4 ms
Lead Channel Sensing Intrinsic Amplitude: 12 mV
Lead Channel Setting Sensing Sensitivity: 6 mV
MDC IDC MSMT BATTERY REMAINING PERCENTAGE: 95.5 %
MDC IDC MSMT BATTERY VOLTAGE: 3.01 V
MDC IDC PG SERIAL: 3014543
MDC IDC SESS DTM: 20180611174439
MDC IDC SET LEADCHNL RV PACING AMPLITUDE: 2.5 V
MDC IDC SET LEADCHNL RV PACING PULSEWIDTH: 0.4 ms
MDC IDC STAT BRADY RV PERCENT PACED: 99 %
Pulse Gen Model: 1240

## 2017-01-05 DIAGNOSIS — I2510 Atherosclerotic heart disease of native coronary artery without angina pectoris: Secondary | ICD-10-CM | POA: Diagnosis not present

## 2017-01-05 DIAGNOSIS — Z0000 Encounter for general adult medical examination without abnormal findings: Secondary | ICD-10-CM | POA: Diagnosis not present

## 2017-01-05 DIAGNOSIS — I700 Atherosclerosis of aorta: Secondary | ICD-10-CM | POA: Diagnosis not present

## 2017-01-05 DIAGNOSIS — E1122 Type 2 diabetes mellitus with diabetic chronic kidney disease: Secondary | ICD-10-CM | POA: Diagnosis not present

## 2017-01-05 DIAGNOSIS — F329 Major depressive disorder, single episode, unspecified: Secondary | ICD-10-CM | POA: Diagnosis not present

## 2017-01-05 DIAGNOSIS — I482 Chronic atrial fibrillation: Secondary | ICD-10-CM | POA: Diagnosis not present

## 2017-01-05 DIAGNOSIS — I5032 Chronic diastolic (congestive) heart failure: Secondary | ICD-10-CM | POA: Diagnosis not present

## 2017-01-05 DIAGNOSIS — I350 Nonrheumatic aortic (valve) stenosis: Secondary | ICD-10-CM | POA: Diagnosis not present

## 2017-01-05 DIAGNOSIS — K7460 Unspecified cirrhosis of liver: Secondary | ICD-10-CM | POA: Diagnosis not present

## 2017-01-05 DIAGNOSIS — I10 Essential (primary) hypertension: Secondary | ICD-10-CM | POA: Diagnosis not present

## 2017-01-05 DIAGNOSIS — E7800 Pure hypercholesterolemia, unspecified: Secondary | ICD-10-CM | POA: Diagnosis not present

## 2017-01-12 ENCOUNTER — Ambulatory Visit (HOSPITAL_COMMUNITY)
Admission: RE | Admit: 2017-01-12 | Discharge: 2017-01-12 | Disposition: A | Discharge status: Home / Self Care | Payer: Medicare Other | Source: Ambulatory Visit | Attending: Cardiology | Admitting: Cardiology

## 2017-01-12 ENCOUNTER — Encounter (HOSPITAL_COMMUNITY): Payer: Self-pay

## 2017-01-12 DIAGNOSIS — R188 Other ascites: Secondary | ICD-10-CM | POA: Insufficient documentation

## 2017-01-12 NOTE — Progress Notes (Signed)
Paracentesis complete no signs of distress. 5l amber colored ascites removed.

## 2017-01-12 NOTE — Procedures (Signed)
   US guided RLQ paracentesis  5 L amber fluid obtained  Tolerated well No labs per MD

## 2017-02-06 ENCOUNTER — Telehealth (HOSPITAL_COMMUNITY): Payer: Self-pay | Admitting: *Deleted

## 2017-02-06 DIAGNOSIS — R188 Other ascites: Secondary | ICD-10-CM

## 2017-02-06 NOTE — Telephone Encounter (Signed)
Patient called asking for a Paracentesis to be scheduled at [facility] tomorrow.  Patient has a standing order for this.  I have scheduled him for 9:00 AM tomorrow at [facility].  Patient is aware that he needs to be there at 8:30 am.  Order placed.

## 2017-02-07 ENCOUNTER — Encounter (HOSPITAL_COMMUNITY): Payer: Self-pay

## 2017-02-07 ENCOUNTER — Ambulatory Visit (HOSPITAL_COMMUNITY)
Admission: RE | Admit: 2017-02-07 | Discharge: 2017-02-07 | Disposition: A | Discharge status: Home / Self Care | Payer: Medicare Other | Source: Ambulatory Visit | Attending: Cardiology | Admitting: Cardiology

## 2017-02-07 ENCOUNTER — Ambulatory Visit (HOSPITAL_COMMUNITY): Payer: Medicare Other

## 2017-02-07 DIAGNOSIS — R188 Other ascites: Secondary | ICD-10-CM | POA: Diagnosis not present

## 2017-02-07 NOTE — Procedures (Signed)
PreOperative Dx: Cirrhosis, ascites Postoperative Dx: Cirrhosis, ascites Procedure:   US guided paracentesis Radiologist:  Riham Polyakov Anesthesia:  10 ml of1% lidocaine Specimen:  4.2 L of amber colored ascitic fluid EBL:   < 1 ml Complications: None  

## 2017-02-07 NOTE — Progress Notes (Signed)
Paracentesis complete no signs of distress. 4.2L amber colored ascites removed.

## 2017-02-21 DIAGNOSIS — E7800 Pure hypercholesterolemia, unspecified: Secondary | ICD-10-CM | POA: Diagnosis not present

## 2017-02-21 DIAGNOSIS — E1165 Type 2 diabetes mellitus with hyperglycemia: Secondary | ICD-10-CM | POA: Diagnosis not present

## 2017-02-21 DIAGNOSIS — E559 Vitamin D deficiency, unspecified: Secondary | ICD-10-CM | POA: Diagnosis not present

## 2017-02-21 DIAGNOSIS — I10 Essential (primary) hypertension: Secondary | ICD-10-CM | POA: Diagnosis not present

## 2017-02-21 DIAGNOSIS — N189 Chronic kidney disease, unspecified: Secondary | ICD-10-CM | POA: Diagnosis not present

## 2017-03-02 ENCOUNTER — Ambulatory Visit (HOSPITAL_COMMUNITY)
Admission: RE | Admit: 2017-03-02 | Discharge: 2017-03-02 | Disposition: A | Discharge status: Home / Self Care | Payer: Medicare Other | Source: Ambulatory Visit | Attending: Cardiology | Admitting: Cardiology

## 2017-03-02 ENCOUNTER — Encounter (HOSPITAL_COMMUNITY): Payer: Self-pay

## 2017-03-02 ENCOUNTER — Telehealth (HOSPITAL_COMMUNITY): Payer: Self-pay

## 2017-03-02 ENCOUNTER — Ambulatory Visit (HOSPITAL_COMMUNITY): Admission: RE | Admit: 2017-03-02 | Payer: Medicare Other | Source: Ambulatory Visit

## 2017-03-02 ENCOUNTER — Other Ambulatory Visit (HOSPITAL_COMMUNITY): Payer: Self-pay | Admitting: *Deleted

## 2017-03-02 ENCOUNTER — Other Ambulatory Visit (HOSPITAL_COMMUNITY): Payer: Self-pay

## 2017-03-02 DIAGNOSIS — K7460 Unspecified cirrhosis of liver: Secondary | ICD-10-CM | POA: Insufficient documentation

## 2017-03-02 DIAGNOSIS — R188 Other ascites: Secondary | ICD-10-CM

## 2017-03-02 NOTE — Telephone Encounter (Signed)
Scheduled patient for outpatient US guided paracentesis at [facility] per Dr. Aundra Dubin standing order for therapeutic reasons to relieve symptoms. Patient scheduled today at 1 pm and follow up with Dr. Aundra Dubin.  Renee Pain, RN

## 2017-03-02 NOTE — Procedures (Signed)
PreOperative Dx: Cirrhosis, ascites Postoperative Dx: Cirrhosis, ascites Procedure:   US guided paracentesis Radiologist:  Thornton Papas Anesthesia:  10 ml of1% lidocaine Specimen:  5 L of amber colored ascitic fluid EBL:   < 1 ml Complications: None

## 2017-03-03 ENCOUNTER — Ambulatory Visit (HOSPITAL_COMMUNITY): Payer: Medicare Other

## 2017-03-03 DIAGNOSIS — Z6841 Body Mass Index (BMI) 40.0 and over, adult: Secondary | ICD-10-CM | POA: Diagnosis not present

## 2017-03-03 DIAGNOSIS — N183 Chronic kidney disease, stage 3 (moderate): Secondary | ICD-10-CM | POA: Diagnosis not present

## 2017-03-03 DIAGNOSIS — K7460 Unspecified cirrhosis of liver: Secondary | ICD-10-CM | POA: Diagnosis not present

## 2017-03-03 DIAGNOSIS — I5030 Unspecified diastolic (congestive) heart failure: Secondary | ICD-10-CM | POA: Diagnosis not present

## 2017-03-06 DIAGNOSIS — L989 Disorder of the skin and subcutaneous tissue, unspecified: Secondary | ICD-10-CM | POA: Diagnosis not present

## 2017-03-07 ENCOUNTER — Other Ambulatory Visit (HOSPITAL_COMMUNITY): Payer: Self-pay | Admitting: Cardiology

## 2017-03-11 ENCOUNTER — Other Ambulatory Visit (HOSPITAL_COMMUNITY): Payer: Self-pay | Admitting: Internal Medicine

## 2017-03-15 ENCOUNTER — Telehealth (HOSPITAL_COMMUNITY): Payer: Self-pay | Admitting: *Deleted

## 2017-03-15 DIAGNOSIS — R188 Other ascites: Secondary | ICD-10-CM

## 2017-03-15 NOTE — Telephone Encounter (Signed)
Pt. called wanting to have PRN paracentesis at [facility] per notes. Order placed and scheduled for 03/16/17 at 0900, and patient made aware.

## 2017-03-16 ENCOUNTER — Ambulatory Visit (HOSPITAL_COMMUNITY)
Admission: RE | Admit: 2017-03-16 | Discharge: 2017-03-16 | Disposition: A | Discharge status: Home / Self Care | Payer: Medicare Other | Source: Ambulatory Visit | Attending: Cardiology | Admitting: Cardiology

## 2017-03-16 DIAGNOSIS — R188 Other ascites: Secondary | ICD-10-CM | POA: Insufficient documentation

## 2017-03-16 NOTE — Progress Notes (Signed)
Paracentesis complete no signs of distress, 3.3 yellow colroed ascites removed.

## 2017-03-22 ENCOUNTER — Encounter: Payer: Self-pay | Admitting: Hematology and Oncology

## 2017-03-22 ENCOUNTER — Telehealth: Payer: Self-pay | Admitting: Hematology and Oncology

## 2017-03-22 NOTE — Telephone Encounter (Signed)
appt has been scheduled for the pt to see Dr. Lebron Conners on 9/18 at 11am. Appt date and time given to the pt's wife. Aware to arrive 30 minutes early. Letter mailed.

## 2017-03-28 DIAGNOSIS — J069 Acute upper respiratory infection, unspecified: Secondary | ICD-10-CM | POA: Diagnosis not present

## 2017-04-03 ENCOUNTER — Ambulatory Visit (INDEPENDENT_AMBULATORY_CARE_PROVIDER_SITE_OTHER): Payer: Medicare Other | Admitting: *Deleted

## 2017-04-03 DIAGNOSIS — I442 Atrioventricular block, complete: Secondary | ICD-10-CM | POA: Diagnosis not present

## 2017-04-03 NOTE — Progress Notes (Signed)
Remote pacemaker transmission.   

## 2017-04-04 ENCOUNTER — Encounter (HOSPITAL_COMMUNITY): Payer: Self-pay | Admitting: Cardiology

## 2017-04-04 ENCOUNTER — Ambulatory Visit (HOSPITAL_COMMUNITY)
Admission: RE | Admit: 2017-04-04 | Discharge: 2017-04-04 | Disposition: A | Discharge status: Home / Self Care | Payer: Medicare Other | Source: Ambulatory Visit | Attending: Cardiology | Admitting: Cardiology

## 2017-04-04 VITALS — BP 108/58 | HR 76 | Wt 292.8 lb

## 2017-04-04 DIAGNOSIS — N183 Chronic kidney disease, stage 3 (moderate): Secondary | ICD-10-CM | POA: Insufficient documentation

## 2017-04-04 DIAGNOSIS — E785 Hyperlipidemia, unspecified: Secondary | ICD-10-CM | POA: Insufficient documentation

## 2017-04-04 DIAGNOSIS — I482 Chronic atrial fibrillation, unspecified: Secondary | ICD-10-CM

## 2017-04-04 DIAGNOSIS — Z794 Long term (current) use of insulin: Secondary | ICD-10-CM | POA: Diagnosis not present

## 2017-04-04 DIAGNOSIS — R531 Weakness: Secondary | ICD-10-CM | POA: Diagnosis not present

## 2017-04-04 DIAGNOSIS — I5032 Chronic diastolic (congestive) heart failure: Secondary | ICD-10-CM | POA: Diagnosis not present

## 2017-04-04 DIAGNOSIS — Z87891 Personal history of nicotine dependence: Secondary | ICD-10-CM | POA: Diagnosis not present

## 2017-04-04 DIAGNOSIS — Z953 Presence of xenogenic heart valve: Secondary | ICD-10-CM | POA: Insufficient documentation

## 2017-04-04 DIAGNOSIS — J449 Chronic obstructive pulmonary disease, unspecified: Secondary | ICD-10-CM | POA: Diagnosis not present

## 2017-04-04 DIAGNOSIS — Z79899 Other long term (current) drug therapy: Secondary | ICD-10-CM | POA: Insufficient documentation

## 2017-04-04 DIAGNOSIS — Z951 Presence of aortocoronary bypass graft: Secondary | ICD-10-CM | POA: Insufficient documentation

## 2017-04-04 DIAGNOSIS — Z9889 Other specified postprocedural states: Secondary | ICD-10-CM

## 2017-04-04 DIAGNOSIS — K219 Gastro-esophageal reflux disease without esophagitis: Secondary | ICD-10-CM | POA: Diagnosis not present

## 2017-04-04 DIAGNOSIS — I130 Hypertensive heart and chronic kidney disease with heart failure and stage 1 through stage 4 chronic kidney disease, or unspecified chronic kidney disease: Secondary | ICD-10-CM | POA: Diagnosis not present

## 2017-04-04 DIAGNOSIS — K7460 Unspecified cirrhosis of liver: Secondary | ICD-10-CM | POA: Diagnosis not present

## 2017-04-04 DIAGNOSIS — D696 Thrombocytopenia, unspecified: Secondary | ICD-10-CM | POA: Diagnosis not present

## 2017-04-04 DIAGNOSIS — G4733 Obstructive sleep apnea (adult) (pediatric): Secondary | ICD-10-CM | POA: Insufficient documentation

## 2017-04-04 DIAGNOSIS — R188 Other ascites: Secondary | ICD-10-CM | POA: Insufficient documentation

## 2017-04-04 DIAGNOSIS — E1122 Type 2 diabetes mellitus with diabetic chronic kidney disease: Secondary | ICD-10-CM | POA: Insufficient documentation

## 2017-04-04 DIAGNOSIS — I2510 Atherosclerotic heart disease of native coronary artery without angina pectoris: Secondary | ICD-10-CM | POA: Diagnosis not present

## 2017-04-04 DIAGNOSIS — M109 Gout, unspecified: Secondary | ICD-10-CM | POA: Insufficient documentation

## 2017-04-04 DIAGNOSIS — I4821 Permanent atrial fibrillation: Secondary | ICD-10-CM

## 2017-04-04 DIAGNOSIS — I509 Heart failure, unspecified: Secondary | ICD-10-CM

## 2017-04-04 LAB — BASIC METABOLIC PANEL
ANION GAP: 10 (ref 5–15)
BUN: 65 mg/dL — ABNORMAL HIGH (ref 6–20)
CALCIUM: 8.8 mg/dL — AB (ref 8.9–10.3)
CO2: 20 mmol/L — ABNORMAL LOW (ref 22–32)
Chloride: 99 mmol/L — ABNORMAL LOW (ref 101–111)
Creatinine, Ser: 2.52 mg/dL — ABNORMAL HIGH (ref 0.61–1.24)
GFR calc Af Amer: 29 mL/min — ABNORMAL LOW (ref >60)
GFR, EST NON AFRICAN AMERICAN: 25 mL/min — AB (ref >60)
Glucose, Bld: 200 mg/dL — ABNORMAL HIGH (ref 65–99)
Potassium: 5.3 mmol/L — ABNORMAL HIGH (ref 3.5–5.1)
SODIUM: 129 mmol/L — AB (ref 135–145)

## 2017-04-04 LAB — CBC
HCT: 31.7 % — ABNORMAL LOW (ref 39.0–52.0)
HEMOGLOBIN: 9.5 g/dL — AB (ref 13.0–17.0)
MCH: 25.1 pg — ABNORMAL LOW (ref 26.0–34.0)
MCHC: 30 g/dL (ref 30.0–36.0)
MCV: 83.9 fL (ref 78.0–100.0)
Platelets: 188 10*3/uL (ref 150–400)
RBC: 3.78 MIL/uL — AB (ref 4.22–5.81)
RDW: 18 % — ABNORMAL HIGH (ref 11.5–15.5)
WBC: 8.1 10*3/uL (ref 4.0–10.5)

## 2017-04-04 MED ORDER — TORSEMIDE 20 MG PO TABS
120.0000 mg | ORAL_TABLET | Freq: Two times a day (BID) | ORAL | 3 refills | Status: DC
Start: 2017-04-04 — End: 2017-06-29

## 2017-04-04 MED ORDER — METOLAZONE 5 MG PO TABS: 5.0000 mg | ORAL_TABLET | ORAL | 3 refills | Status: DC

## 2017-04-04 NOTE — Patient Instructions (Signed)
Increase Metol ozone to 5 mg (1 tab) every other day  Increase Torsemide to 120 mg ( 6 tabs) twice a day  Labs drawn today  Your physician recommends that you return for lab work in: 10 days  You have been referred to [place] they will contact you  Your physician recommends that you schedule a follow-up appointment in: 1 month

## 2017-04-04 NOTE — Progress Notes (Signed)
Patient ID: [name], male   DOB: 29-Mar-1946, 71 y.o.   MRN: [id number]    Advanced Heart Failure Clinic Note   GI: Dr Laural Golden- followed for cirrhosis Pulmonary: Dr Melvyn Novas  PCP: Marjean Donna [facility])  Cardiology: Dr. 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  Last LHC in 8/13 showed patent grafts.  AVR was in 10/13.  Last echo in 1/18 showed EF 60-65%, normal bioprosthetic aortic valve.   July 2015:  Admitted to the hospital for GI bleed requiring 2 PRBCs. No additional GI work-up with recent EGD showing gastritis.  He had an AV nodal ablation in 9/15 with significant improvement after this.  He has St Jude PPM.   In 6/17, had right knee operation.    [name] presents today for followup of chronic diastolic CHF.  He continues to get frequent therapeutic paracenteses, most recently 3 wks ago.  He is scheduled for a paracentesis tomorrow.  He is short of breath walking 30-40 feet, worse in the last month.  Wife says he is getting very inactive.  Chronic orthopnea.  No chest pain. No lightheadedness or palpitations. Weight is up about 28 lbs since last appointment.  He is following with Dr. Joelyn Oms for nephrology.   Labs (8/14) LDL 38 Labs (3/15) K 3.4, creatinine 1.7, BUN 55  Labs (10/15/13) K 4.0 Creatinine 1.6 BUN 32 Labs (10/29/13) K 4.0 Creatinine 1.6  Labs (4/15) K 4.2 Creatinine 1.32 => 1.5, HCT 35.2 Labs (12/11/13) K 3.6 Creatinine 1.56 Pro BNP 1152 Hemoglobin 9.6  Labs (01/12/14) K 3.0, creatinine 2.0, hemoglobin 8.8 Labs (01/31/14) K 3.3, creatinine 1.49 Labs (8/15) K 3.9, creatinine 1.89 Labs (9/15): K 3.7, creatinine 1.6, AST 48, ALT 52, TSH normal Labs (10/15): K 3.5, creatinine 1.58, BNP 780 Labs (3/16): K 3.5, creatinine 1.6 Labs 12/03/2014: K 4.4  Creatinine 1.55 Labs 12/12/2014: K 3.7 Creatinine 1.48, LDL 50 Labs 8/16: K 3.8, creatinine 2.42 Labs 04/07/2015: K 4.3, Creatinine 2.22, BNP 169 Labs (11/16): K 4, creatinine 1.78 => 1.55, BNP 120 Labs 11/11/2015 K 3.7 Creatinine 1.53  Labs (6/17): K 3.8, creatinine 1.52 Labs (12/17): K 3.5, creatinine 1.95, LDL 32 Labs (1/18): K 4.2, creatinine 2.51 Labs (4/18): K 3.6, creatinine 2.5, hgb 10.2  PMH: 1. CAD: s/p CABG 2004. Last LHC (8/13) with patent free radial-OM, patent SVG-D, patent LIMA-LAD.  2. Permanent atrial fibrillation: Not anticoagulated due to GI bleeding.  Holter monitor (4/15) with mean HR 115 (afib).   He had AV nodal ablation with St Jude dual chamber PPM  3. Chronic diastolic CHF: Echo (0/62) with EF 55-60%, bioprosthetic aortic valve with mean gradient 22 mmHg, mildly decreased RV systolic function. RHC (12/2013): RA 14, RV 44/4/11, PA 50/23 (33), PCWP 20, v = 35, Fick CO/CI: 6.0/2.6, PVR 2.2 WU, PA 59%.  Echo (8/15) with EF 55-60%, mild LVH, bioprosthetic aortic valve with mean gradient 16 mmHg, PA systolic pressure 46 mmHg, mild to moderate Matthew.  Echo (8/16) with EF 55-60%, normal bioprosthetic aortic valve, mild Matthew, PASP 44 mmHg.  - Gynecomastia with spironolactone.  - Echo (1/18): EF 60-65%, grade II diastolic dysfunction, bioprosthetic aortic valve appeared normal, PA systolic pressure 59 mmHg.  4. Severe aortic stenosis s/p bioprosthetic AVR in 10/13. Mean gradient 22 mmHg across valve on echo in  8/14.  Mean gradient 16 mmHg on echo in 8/15. Mean gradient 15 mmHg by echo in 1/18.  5. NASH with cirrhosis: ascites, thrombocytopenia.  CT abdomen in 6/17 showed cirrhosis and moderate ascites. He has regular paracenteses.  6. 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  EGD in 1/15 showed mild portal gastropathy and 2 small antral ulcers. Recurrent GI bleed in 6/15, EGD showed gastritis and ?GAVE.  - EGD (4/18): grade I varices, gastric polyps, GAVE.   - Colonoscopy (4/18): No bleeding  source.  7. Hyperlipidemia 8. HTN 9. GERD 10. COPD 34. OSA: Cannot tolerate CPAP.  12. 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  14. Carotid dopplers (11/15) with minimal stenosis.  15. ABI 12/11/2014: normal    SH: Lives with wife in [place], prior smoker.   FH: CAD  ROS: All systems reviewed and negative except as per HPI.   Current Outpatient Prescriptions on File Prior to Encounter  Medication Sig Dispense Refill  . acetaminophen (TYLENOL) 500 MG tablet Take 1,000 mg by mouth at bedtime.     [name] allopurinol (ZYLOPRIM) 100 MG tablet TAKE 1 TABLET DAILY 30 tablet 6  . atorvastatin (LIPITOR) 20 MG tablet Take 20 mg by mouth daily at 6 PM.     . colchicine 0.6 MG tablet Take 0.6 mg by mouth daily.     . ferrous gluconate (FERGON) 324 MG tablet Take 324 mg by mouth 2 (two) times daily with a meal.    . gabapentin (NEURONTIN) 300 MG capsule Take 300 mg by mouth 3 (three) times daily.  6  . insulin NPH Human (HUMULIN N,NOVOLIN N) 100 UNIT/ML injection Inject 65 Units into the skin 2 (two) times daily before a meal.     . insulin regular (NOVOLIN R,HUMULIN R) 100 units/mL injection Inject 30 Units into the skin 2 (two) times daily before a meal.     . methocarbamol (ROBAXIN) 500 MG tablet Take 1 tablet (500 mg total) by mouth 4 (four) times daily. As needed for muscle spasm 30 tablet 1  . metoprolol succinate (TOPROL-XL) 100 MG 24 hr tablet TAKE 1 TABLET (100 MG TOTAL) BY MOUTH 2 (TWO) TIMES DAILY. 60 tablet 6  . Multiple Vitamins-Minerals ([employer] SPECTRAVITE ADULT 50+ PO) Take 1 tablet by mouth daily.    [name] omeprazole (PRILOSEC) 20 MG capsule Take 1 capsule (20 mg total) by mouth 2 (two) times daily before a meal. 30 capsule 5  . potassium chloride SA (K-DUR,KLOR-CON) 20 MEQ tablet Take 3 tablets (60 mEq total) by mouth 3 (three) times daily. Take an extra 2 tablets on M,W,Sa with metolazone 500 tablet 6  . pregabalin (LYRICA) 75 MG capsule Take 75 mg by mouth at bedtime.     . sertraline (ZOLOFT)  50 MG tablet Take 50 mg by mouth daily.    [name] spironolactone (ALDACTONE) 50 MG tablet TAKE 1 TABLET (50 MG TOTAL) BY MOUTH 2 (TWO) TIMES DAILY. 60 tablet 3  . tamsulosin (FLOMAX) 0.4 MG CAPS capsule TAKE ONE CAPSULE BY MOUTH EVERY DAY AFTER SUPPER 30 capsule 3  . vitamin C (ASCORBIC ACID) 500 MG tablet Take 500 mg by mouth 2 (two) times daily.     No current facility-administered medications on file prior to encounter.     Vitals:   04/04/17 0845  BP: (!) 108/58  Pulse: 76  SpO2: 96%  Weight: 292 lb 12.8 oz (132.8 kg)     Wt Readings from Last 3 Encounters:  04/04/17 292 lb 12.8 oz (132.8 kg)  10/26/16 275 lb (124.7 kg)  09/30/16 277 lb 12.8 oz (126 kg)    General: NAD Neck: JVP 14 cm, no thyromegaly or thyroid nodule.  Lungs: Clear to auscultation bilaterally with normal respiratory effort. CV: Nondisplaced PMI.  Heart regular S1/S2, no S3/S4, 2/6 early SEM RUSB.  No peripheral edema.  No carotid bruit.  Normal pedal pulses.  Abdomen: Soft, nontender, no hepatosplenomegaly, moderate to severe abdominal distention.  Skin: Intact without lesions or rashes.  Neurologic: Alert and oriented x 3.  Psych: Normal affect. Extremities: No clubbing or cyanosis.  HEENT: Normal.   Assessment/Plan: 1. Chronic diastolic CHF:  EF 29-92% (1/18).  NYHA class IIIb symptoms, worse recently.  He has ascites due to cardiac cirrhosis/NASH.  He is significantly volume overloaded on exam today.  - Needs paracentesis, arranged for tomorrow.  I will try to get a standing order for paracenteses every 2 wks.   - Increase torsemide to 120 mg bid.    - Increase metolazone to 5 mg every other day.   - Continue spironolactone 50 mg bid.   - BMET today and again in 10 days.  2. Chronic atrial fibrillation:  CHADSVASC 4 - age > 16, CHF, HTN, DM2. 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 s/p AV node ablation with dual chamber pacing given very difficult rate  control. Hemoglobin has been fairly stable recently.  - Intolerant of ASA and coumadin even for short period, so no Watchman pursued. - Recent EGD showed GAVE.  3. CKD stage III: BMET today.  He follows with Dr. Joelyn Oms.  4. Bioprosthetic AVR: Stable on 1/18 echo.    5. CAD: s/p CABG.  No chest pain.   - Continue atorvastatin 20 mg daily and Toprol XL 100 mg BID.  - Off ASA and anticoag with history of GI bleed.  6. Cirrhosis: May be a combination of cardiac cirrhosis and NASH-related. Follows with Dr. Laural Golden.  - Prominent ascites, plan paracentesis.   Given immobility/home-bound, will try to arrange for home PT.   Follow up in 1 month with NP/PA.   [name] 04/04/2017

## 2017-04-05 ENCOUNTER — Telehealth (HOSPITAL_COMMUNITY): Payer: Self-pay

## 2017-04-05 ENCOUNTER — Encounter (HOSPITAL_COMMUNITY): Payer: Self-pay

## 2017-04-05 ENCOUNTER — Encounter: Payer: Medicare Other | Admitting: Hematology and Oncology

## 2017-04-05 ENCOUNTER — Ambulatory Visit (HOSPITAL_COMMUNITY)
Admission: RE | Admit: 2017-04-05 | Discharge: 2017-04-05 | Disposition: A | Discharge status: Home / Self Care | Payer: Medicare Other | Source: Ambulatory Visit | Attending: Cardiology | Admitting: Cardiology

## 2017-04-05 ENCOUNTER — Encounter: Payer: Self-pay | Admitting: Cardiology

## 2017-04-05 DIAGNOSIS — I5032 Chronic diastolic (congestive) heart failure: Secondary | ICD-10-CM | POA: Diagnosis not present

## 2017-04-05 DIAGNOSIS — R188 Other ascites: Secondary | ICD-10-CM | POA: Diagnosis not present

## 2017-04-05 DIAGNOSIS — I2510 Atherosclerotic heart disease of native coronary artery without angina pectoris: Secondary | ICD-10-CM | POA: Diagnosis not present

## 2017-04-05 DIAGNOSIS — I509 Heart failure, unspecified: Secondary | ICD-10-CM

## 2017-04-05 DIAGNOSIS — N183 Chronic kidney disease, stage 3 (moderate): Secondary | ICD-10-CM | POA: Diagnosis not present

## 2017-04-05 DIAGNOSIS — Z951 Presence of aortocoronary bypass graft: Secondary | ICD-10-CM | POA: Diagnosis not present

## 2017-04-05 DIAGNOSIS — I482 Chronic atrial fibrillation: Secondary | ICD-10-CM | POA: Diagnosis not present

## 2017-04-05 DIAGNOSIS — I130 Hypertensive heart and chronic kidney disease with heart failure and stage 1 through stage 4 chronic kidney disease, or unspecified chronic kidney disease: Secondary | ICD-10-CM | POA: Diagnosis not present

## 2017-04-05 MED ORDER — POTASSIUM CHLORIDE CRYS ER 20 MEQ PO TBCR: 40.0000 meq | EXTENDED_RELEASE_TABLET | Freq: Two times a day (BID) | ORAL | 6 refills | Status: DC

## 2017-04-05 NOTE — Telephone Encounter (Signed)
Changed med in the chart  Notes recorded by Shirley Muscat, RN on 04/05/2017 at 2:29 PM EDT Pt aware and agreeable   ------  Notes recorded by Larey Dresser, MD on 04/05/2017 at 1:44 PM EDT Decrease KCl to 40 bid, repeat BMET 1 week. ------  Notes recorded by Shirley Muscat, RN on 04/05/2017 at 1:01 PM EDT Pt reports he takes K+ 60 meq only BID instead of TID ------  Notes recorded by Shirley Muscat, RN on 04/04/2017 at 1:33 PM EDT Called pt unable to leave VM ------  Notes recorded by Larey Dresser, MD on 04/04/2017 at 1:10 PM EDT Hold next dose of KCl and decrease to 40 tid. Repeat BMET 1 week.

## 2017-04-05 NOTE — Procedures (Signed)
  US guided RLQ paracentesis  5.4 Liter yellow fluid  Tolerated well

## 2017-04-05 NOTE — Progress Notes (Signed)
Paracentesis complete no signs of distress.  

## 2017-04-06 ENCOUNTER — Other Ambulatory Visit (HOSPITAL_COMMUNITY): Payer: Self-pay | Admitting: Pharmacist

## 2017-04-06 DIAGNOSIS — R188 Other ascites: Secondary | ICD-10-CM | POA: Diagnosis not present

## 2017-04-06 DIAGNOSIS — Z794 Long term (current) use of insulin: Secondary | ICD-10-CM | POA: Diagnosis not present

## 2017-04-06 DIAGNOSIS — Z951 Presence of aortocoronary bypass graft: Secondary | ICD-10-CM | POA: Diagnosis not present

## 2017-04-06 DIAGNOSIS — I5032 Chronic diastolic (congestive) heart failure: Secondary | ICD-10-CM | POA: Diagnosis not present

## 2017-04-06 DIAGNOSIS — E1122 Type 2 diabetes mellitus with diabetic chronic kidney disease: Secondary | ICD-10-CM | POA: Diagnosis not present

## 2017-04-06 DIAGNOSIS — Z87891 Personal history of nicotine dependence: Secondary | ICD-10-CM | POA: Diagnosis not present

## 2017-04-06 DIAGNOSIS — Z952 Presence of prosthetic heart valve: Secondary | ICD-10-CM | POA: Diagnosis not present

## 2017-04-06 DIAGNOSIS — I130 Hypertensive heart and chronic kidney disease with heart failure and stage 1 through stage 4 chronic kidney disease, or unspecified chronic kidney disease: Secondary | ICD-10-CM | POA: Diagnosis not present

## 2017-04-06 DIAGNOSIS — N189 Chronic kidney disease, unspecified: Secondary | ICD-10-CM | POA: Diagnosis not present

## 2017-04-06 DIAGNOSIS — G4733 Obstructive sleep apnea (adult) (pediatric): Secondary | ICD-10-CM | POA: Diagnosis not present

## 2017-04-06 DIAGNOSIS — I2510 Atherosclerotic heart disease of native coronary artery without angina pectoris: Secondary | ICD-10-CM | POA: Diagnosis not present

## 2017-04-06 DIAGNOSIS — M1990 Unspecified osteoarthritis, unspecified site: Secondary | ICD-10-CM | POA: Diagnosis not present

## 2017-04-06 DIAGNOSIS — K7460 Unspecified cirrhosis of liver: Secondary | ICD-10-CM | POA: Diagnosis not present

## 2017-04-06 LAB — CUP PACEART REMOTE DEVICE CHECK
Implantable Pulse Generator Implant Date: 20150831
Lead Channel Setting Pacing Amplitude: 2.5 V
Lead Channel Setting Sensing Sensitivity: 6 mV
MDC IDC LEAD IMPLANT DT: 20150831
MDC IDC LEAD LOCATION: 753860
MDC IDC SESS DTM: 20180913120732
MDC IDC SET LEADCHNL RV PACING PULSEWIDTH: 0.4 ms
Pulse Gen Model: 1240
Pulse Gen Serial Number: 3014543

## 2017-04-06 MED ORDER — METOLAZONE 5 MG PO TABS: 5.0000 mg | ORAL_TABLET | ORAL | 3 refills | Status: DC

## 2017-04-10 DIAGNOSIS — I130 Hypertensive heart and chronic kidney disease with heart failure and stage 1 through stage 4 chronic kidney disease, or unspecified chronic kidney disease: Secondary | ICD-10-CM | POA: Diagnosis not present

## 2017-04-10 DIAGNOSIS — E1122 Type 2 diabetes mellitus with diabetic chronic kidney disease: Secondary | ICD-10-CM | POA: Diagnosis not present

## 2017-04-10 DIAGNOSIS — I5032 Chronic diastolic (congestive) heart failure: Secondary | ICD-10-CM | POA: Diagnosis not present

## 2017-04-10 DIAGNOSIS — K7460 Unspecified cirrhosis of liver: Secondary | ICD-10-CM | POA: Diagnosis not present

## 2017-04-10 DIAGNOSIS — N189 Chronic kidney disease, unspecified: Secondary | ICD-10-CM | POA: Diagnosis not present

## 2017-04-10 DIAGNOSIS — I2510 Atherosclerotic heart disease of native coronary artery without angina pectoris: Secondary | ICD-10-CM | POA: Diagnosis not present

## 2017-04-11 ENCOUNTER — Telehealth: Payer: Self-pay | Admitting: Hematology and Oncology

## 2017-04-11 ENCOUNTER — Encounter: Payer: Self-pay | Admitting: Hematology and Oncology

## 2017-04-11 ENCOUNTER — Ambulatory Visit (HOSPITAL_BASED_OUTPATIENT_CLINIC_OR_DEPARTMENT_OTHER): Payer: Medicare Other | Admitting: Hematology and Oncology

## 2017-04-11 ENCOUNTER — Ambulatory Visit (HOSPITAL_BASED_OUTPATIENT_CLINIC_OR_DEPARTMENT_OTHER): Payer: Medicare Other

## 2017-04-11 VITALS — BP 95/55 | HR 74 | Temp 97.8°F | Resp 18 | Ht 68.0 in | Wt 280.3 lb

## 2017-04-11 DIAGNOSIS — D696 Thrombocytopenia, unspecified: Secondary | ICD-10-CM

## 2017-04-11 DIAGNOSIS — D649 Anemia, unspecified: Secondary | ICD-10-CM

## 2017-04-11 DIAGNOSIS — K7460 Unspecified cirrhosis of liver: Secondary | ICD-10-CM

## 2017-04-11 DIAGNOSIS — N183 Chronic kidney disease, stage 3 unspecified: Secondary | ICD-10-CM

## 2017-04-11 DIAGNOSIS — D500 Iron deficiency anemia secondary to blood loss (chronic): Secondary | ICD-10-CM

## 2017-04-11 DIAGNOSIS — R188 Other ascites: Secondary | ICD-10-CM

## 2017-04-11 DIAGNOSIS — D6959 Other secondary thrombocytopenia: Secondary | ICD-10-CM

## 2017-04-11 LAB — COMPREHENSIVE METABOLIC PANEL
ALBUMIN: 3.6 g/dL (ref 3.5–5.0)
ALK PHOS: 82 U/L (ref 40–150)
ALT: 12 U/L (ref 0–55)
AST: 17 U/L (ref 5–34)
Anion Gap: 11 mEq/L (ref 3–11)
BUN: 72 mg/dL — AB (ref 7.0–26.0)
CALCIUM: 9.8 mg/dL (ref 8.4–10.4)
CO2: 22 mEq/L (ref 22–29)
CREATININE: 2.4 mg/dL — AB (ref 0.7–1.3)
Chloride: 99 mEq/L (ref 98–109)
EGFR: 27 mL/min/{1.73_m2} — ABNORMAL LOW (ref >90)
GLUCOSE: 208 mg/dL — AB (ref 70–140)
POTASSIUM: 4.9 meq/L (ref 3.5–5.1)
SODIUM: 133 meq/L — AB (ref 136–145)
TOTAL PROTEIN: 7.7 g/dL (ref 6.4–8.3)
Total Bilirubin: 0.59 mg/dL (ref 0.20–1.20)

## 2017-04-11 LAB — CBC & DIFF AND RETIC
BASO%: 1.2 % (ref 0.0–2.0)
BASOS ABS: 0.1 10*3/uL (ref 0.0–0.1)
EOS ABS: 0.2 10*3/uL (ref 0.0–0.5)
EOS%: 2.3 % (ref 0.0–7.0)
HEMATOCRIT: 33.1 % — AB (ref 38.4–49.9)
HEMOGLOBIN: 10 g/dL — AB (ref 13.0–17.1)
Immature Retic Fract: 30.5 % — ABNORMAL HIGH (ref 3.00–10.60)
LYMPH#: 0.7 10*3/uL — AB (ref 0.9–3.3)
LYMPH%: 9.8 % — ABNORMAL LOW (ref 14.0–49.0)
MCH: 25.1 pg — ABNORMAL LOW (ref 27.2–33.4)
MCHC: 30.2 g/dL — ABNORMAL LOW (ref 32.0–36.0)
MCV: 83.2 fL (ref 79.3–98.0)
MONO#: 0.6 10*3/uL (ref 0.1–0.9)
MONO%: 8.4 % (ref 0.0–14.0)
NEUT#: 5.8 10*3/uL (ref 1.5–6.5)
NEUT%: 78.3 % — AB (ref 39.0–75.0)
NRBC: 0 % (ref 0–0)
Platelets: 167 10*3/uL (ref 140–400)
RBC: 3.98 10*6/uL — ABNORMAL LOW (ref 4.20–5.82)
RDW: 17.5 % — AB (ref 11.0–14.6)
RETIC %: 4.28 % — AB (ref 0.80–1.80)
Retic Ct Abs: 170.34 10*3/uL — ABNORMAL HIGH (ref 34.80–93.90)
WBC: 7.4 10*3/uL (ref 4.0–10.3)

## 2017-04-11 LAB — IRON AND TIBC
%SAT: 22 % (ref 20–55)
Iron: 110 ug/dL (ref 42–163)
TIBC: 508 ug/dL — AB (ref 202–409)
UIBC: 398 ug/dL — ABNORMAL HIGH (ref 117–376)

## 2017-04-11 LAB — LACTATE DEHYDROGENASE: LDH: 188 U/L (ref 125–245)

## 2017-04-11 LAB — TSH: TSH: 3.242 m[IU]/L (ref 0.320–4.118)

## 2017-04-11 LAB — FERRITIN: Ferritin: 43 ng/ml (ref 22–316)

## 2017-04-11 NOTE — Telephone Encounter (Signed)
Scheduled appt per 9/18 los - Gave patient AVS and calender per los.  

## 2017-04-12 DIAGNOSIS — I5032 Chronic diastolic (congestive) heart failure: Secondary | ICD-10-CM | POA: Diagnosis not present

## 2017-04-12 DIAGNOSIS — I2510 Atherosclerotic heart disease of native coronary artery without angina pectoris: Secondary | ICD-10-CM | POA: Diagnosis not present

## 2017-04-12 DIAGNOSIS — K7460 Unspecified cirrhosis of liver: Secondary | ICD-10-CM | POA: Diagnosis not present

## 2017-04-12 DIAGNOSIS — E1122 Type 2 diabetes mellitus with diabetic chronic kidney disease: Secondary | ICD-10-CM | POA: Diagnosis not present

## 2017-04-12 DIAGNOSIS — N189 Chronic kidney disease, unspecified: Secondary | ICD-10-CM | POA: Diagnosis not present

## 2017-04-12 DIAGNOSIS — I130 Hypertensive heart and chronic kidney disease with heart failure and stage 1 through stage 4 chronic kidney disease, or unspecified chronic kidney disease: Secondary | ICD-10-CM | POA: Diagnosis not present

## 2017-04-12 LAB — HAPTOGLOBIN: Haptoglobin: 213 mg/dL — ABNORMAL HIGH (ref 34–200)

## 2017-04-12 LAB — FOLATE: Folate: >20 ng/mL (ref >3.0)

## 2017-04-12 LAB — VITAMIN B12: Vitamin B12: 1167 pg/mL (ref 232–1245)

## 2017-04-13 NOTE — Assessment & Plan Note (Addendum)
Patient is not thrombocytopenic at the present time, but was in April-Jul of this year coinciding with the activity to GI bleeding and possible mild decompensation of the cirrhosis and portal hypertension. It is not unusual for patients with portal hypertension to demonstrate thrombocytopenia due to hypersplenism, additionally generalized suppression of the bone marrow might be taking place due to chronic disease and hepatic dysfunction.

## 2017-04-13 NOTE — Assessment & Plan Note (Signed)
71 year old male with extensive comorbidities including congestive heart failure, replaced aortic valve, hepatic cirrhosis and history of gastrointestinal bleeding due to portal gastropathy.  Presenting with moderate anemia that appears to be reasonably chronic, but the most recent value obtained last week seems to be slightly worse than the baseline.  Anemia appears to be normocytic normochromic and likely represents a combination of several etiologies including iron deficiency anemia due to chronic blood loss, anemia due to chronic disease possible anemia of chronic renal insufficiency, anemia due to hepatic dysfunction, and possible elements of low-level hemolysis due to presence of mechanical aortic valve.  My plan here used to proceed with lab work to expand on the available data to include assessment for iron deficiency, B12, folate, TSH, and hemolysis evidence.  Plan: --Labs today as outlined below

## 2017-04-13 NOTE — Progress Notes (Signed)
[facility] New Visit:  Assessment: Iron deficiency anemia 71 year old male with extensive comorbidities including congestive heart failure, replaced aortic valve, hepatic cirrhosis and history of gastrointestinal bleeding due to portal gastropathy.  Presenting with moderate anemia that appears to be reasonably chronic, but the most recent value obtained last week seems to be slightly worse than the baseline.  Anemia appears to be normocytic normochromic and likely represents a combination of several etiologies including iron deficiency anemia due to chronic blood loss, anemia due to chronic disease possible anemia of chronic renal insufficiency, anemia due to hepatic dysfunction, and possible elements of low-level hemolysis due to presence of mechanical aortic valve.  My plan here used to proceed with lab work to expand on the available data to include assessment for iron deficiency, B12, folate, TSH, and hemolysis evidence.  Plan: --Labs today as outlined below  Thrombocytopenia Patient is not thrombocytopenic at the present time, but was in April and text of activity to GI bleeding and possible mild decompensation of the cirrhosis and portal hypertension. Having, for patients with portal hypertension to demonstrate thrombocytopenia due to hypersplenism, additionally generalized suppression of the bone marrow might be taking place due to chronic disease and hepatic dysfunction.   Return to clinic in 1 week to review the findings  Voice recognition software was used and creation of this note. Despite my best effort at editing the text, some misspelling/errors may have occurred.  Orders Placed This Encounter  Procedures  . CBC & Diff and Retic    Standing Status:   Future    Number of Occurrences:   1    Standing Expiration Date:   04/11/2018  . Comprehensive metabolic panel    Standing Status:   Future    Number of Occurrences:   1    Standing Expiration Date:    04/11/2018  . Lactate dehydrogenase (LDH)    Standing Status:   Future    Number of Occurrences:   1    Standing Expiration Date:   04/11/2018  . Ferritin    Standing Status:   Future    Number of Occurrences:   1    Standing Expiration Date:   04/11/2018  . Iron and TIBC    Standing Status:   Future    Number of Occurrences:   1    Standing Expiration Date:   04/11/2018  . Folate, Serum    Standing Status:   Future    Number of Occurrences:   1    Standing Expiration Date:   04/11/2018  . Vitamin B12    Standing Status:   Future    Number of Occurrences:   1    Standing Expiration Date:   04/11/2018  . Haptoglobin    Standing Status:   Future    Number of Occurrences:   1    Standing Expiration Date:   04/11/2018  . TSH    Standing Status:   Future    Number of Occurrences:   1    Standing Expiration Date:   04/11/2018    All questions were answered.  . The patient knows to call the clinic with any problems, questions or concerns.  This note was electronically signed.    History of Presenting Illness [name] is a 71 y.o. male referred to the [facility] for Evaluation of thrombocytopenia and anemia. 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 Please see the results of the recent lab work below for details.  The present time, patient reports being at the baseline of his health. Denies any interval increase in fatigue. He does have previous history of gastrointestinal bleeding had previous endoscopic evaluations revealing bleeding polyps in the stomach as well as chronic gastritis with varices.  Oncological/hematological History: --Labs, 01/14/16: WBC 7.4, Hgb 11.2, MCV 82.4, MCH 25.3, RDW 18.5, Plt 180;  --Labs, 06/28/16: WBC 6.4, Hgb 10.7, MCV 82.5,  MCH 25.8, RDW 17.9, Plt 164;  --Colonoscopy/EGD, Apr 2018: Grade 1 esophageal varices. 0.7 cm sessile polyp in the gastric body with active bleeding, 2 polyps in the prepyloric stomach with evidence of bleeding. Gastropathy with vascular ectasia. Colonoscopy significant for splenic flexure lipoma and external hemorrhoids without evidence of active bleeding. --Labs, 10/26/16: WBC 7.6, Hgb 10.2, MCV 82.7, MCH 25.2, RDW 18.7, Plt 117;  --Labs, 02/21/17: WBC 8.7, Hgb   9.6, MCV 88.3, MCH 27.5, RDW 18.6, Plt 137;  --Labs, 04/04/17: WBC 8.1, Hgb   9.5, MCV 83.9, MCH 25.1, RDW 18.0, Plt 188; Cr 2.52  Medical History: Past Medical History:  Diagnosis Date  . Aortic stenosis 03/08/2012   a.  s/p tissue AVR 10/13 with Dr. [name];   b. Echo 10/13: mod LVH, EF 55-60%, tissue AVR not well seen, no leak, gradient not too high (mean 12mHg), MAC, mild MR, mild LAE, PASP 38  . Ascites    status post paracentesis with removal of 3.4 L of ascitic fluid  . Atrial fibrillation ([place]    Permanent; off of Coumadin for now due to GI bleed  . AVM (arteriovenous malformation)    Recurrent GI bleeding requiring multiple transfusions  . CAD (coronary artery disease)    a. s/p CABG 2004;  b. [place]:  LHC 8/13: Free radial to obtuse marginal patent, SVG-diagonal patent, LIMA-LAD patent, EF 65-70%, mean aortic valve gradient 42  . Carotid bruit 06/14/2011   a. pre-AVR dopplers 10/13: no sig ICA stenosis  . Chronic diastolic heart failure ([facility]   . Cirrhosis ([facility]   . COPD (chronic obstructive pulmonary disease) ([facility] [place]   . DM2 (diabetes mellitus, type 2) (HCC)    at least 10 yrs  . GERD (gastroesophageal reflux disease)   . H/O hiatal hernia   . Hyperlipidemia   . Hypertension    x 15 yrs  . Insomnia   . Iron deficiency anemia    Requiring intravenous iron  . Mediastinal adenopathy 09/22/2011  . OSA (obstructive sleep apnea) 1999    USES CPAP  . Osteoarthritis   . Overweight(278.02)   . Thrombocytopenia  ([facility]     Surgical History: Past Surgical History:  Procedure Laterality Date  . ABLATION  04-18-14   AVN ablation by Dr KCaryl Comes . AORTIC VALVE REPLACEMENT  04/25/2012   Procedure: AORTIC VALVE REPLACEMENT (AVR);  Surgeon: SMelrose Nakayama MD;  [place]  Service: Open Heart Surgery;  Laterality: N/A;  . AV NODE ABLATION N/A 04/18/2014   Procedure: AV NODE ABLATION;  Surgeon: SDeboraha Sprang MD;  [facility];  Service: Cardiovascular;  Laterality: N/A;  . CARPAL TUNNEL RELEASE    10/08/2003  . CATARACT EXTRACTION W/ INTRAOCULAR LENS IMPLANT Bilateral 09/28/2015 , 10/19/2015  . COLONOSCOPY WITH PROPOFOL N/A 10/28/2016   Procedure: COLONOSCOPY WITH PROPOFOL;  Surgeon: NRogene Houston MD;  [facility];  Service: Endoscopy;  Laterality: N/A;  . CORONARY ANGIOPLASTY WITH STENT PLACEMENT  01/19/2005   drug eluting stent to high grade ostial stenosis of radial artery graft to OM  . CORONARY ARTERY BYPASS GRAFT   10/15/2002    [name]. [name], M.D.     . ESOPHAGOGASTRODUODENOSCOPY N/A 07/31/2013   Procedure: ESOPHAGOGASTRODUODENOSCOPY (EGD);  Surgeon: Milus Banister, MD;  Location: [place];  Service: Endoscopy;  Laterality: N/A;  . ESOPHAGOGASTRODUODENOSCOPY Left 01/11/2014   Procedure: ESOPHAGOGASTRODUODENOSCOPY (EGD);  Surgeon: Juanita Craver, MD;  Location[facility] ENDOSCOPY;  Service: Endoscopy;  Laterality: Left;  . ESOPHAGOGASTRODUODENOSCOPY (EGD) WITH PROPOFOL N/A 10/28/2016   Procedure: ESOPHAGOGASTRODUODENOSCOPY (EGD) WITH PROPOFOL;  Surgeon: Rogene Houston, MD;  [facility];  Service: Endoscopy;  Laterality: N/A;  . HERNIA REPAIR    . KNEE ARTHROSCOPY Right 01/20/2016   Procedure: ARTHROSCOPY RIGHT KNEE WITH MENSICAL DEBRIDEMENT;  Surgeon: Gaynelle Arabian, MD;  [facility]: [facility];  Service: Orthopedics;  Laterality: Right;  . LEFT AND RIGHT HEART CATHETERIZATION WITH CORONARY/GRAFT ANGIOGRAM N/A 03/07/2012   Procedure: LEFT AND RIGHT HEART  CATHETERIZATION WITH Beatrix Fetters;  Surgeon: Sherren Mocha, MD;  Location: [facility] CATH LAB;  Service: Cardiovascular;  Laterality: N/A;  . lipoma surgery    . OTHER SURGICAL HISTORY  08/26/2011   [facility],  enteroscopy , revealing "three-four AVMs."   . PACEMAKER INSERTION  03-24-14   STJ Assurity single chamber pacemaker implanted by Dr Caryl Comes  . PARACENTESIS  12/2015  . PERMANENT PACEMAKER INSERTION N/A 03/24/2014   Procedure: PERMANENT PACEMAKER INSERTION;  Surgeon: Deboraha Sprang, MD;  Location[facility] LAB;  Service: Cardiovascular;  Laterality: N/A;  . POLYPECTOMY  10/28/2016   Procedure: POLYPECTOMY;  Surgeon: Rogene Houston, MD;  [facility];  Service: Endoscopy;;  gastric  . RIGHT HEART CATHETERIZATION N/A 01/01/2014   Procedure: RIGHT HEART CATH;  Surgeon: Jolaine Artist, MD;  Location: [facility] CATH LAB;  Service: Cardiovascular;  Laterality: N/A;  . TEE WITHOUT CARDIOVERSION  03/07/2012   Procedure: TRANSESOPHAGEAL ECHOCARDIOGRAM (TEE);  Surgeon: Thayer Headings, MD;  Location: [facility] ENDOSCOPY;  Service: Cardiovascular;  Laterality: N/A;    Family History: Family History  Problem Relation Age of Onset  . Hypertension Father   . Diabetes Father   . Heart disease Father   . COPD Sister   . Cancer Maternal Aunt        Breast cancer   . Cancer Maternal Aunt        Breast cancer   . Diabetes Son   . Cancer Daughter        Cervical cancer  . Coronary artery disease Unknown        FAMILY HISTORY    Social History: Social History   Social History  . Marital status: Married    Spouse name: N/A  . Number of children: 3  . Years of education: N/A   Occupational History  .      [employer] History Main Topics  . Smoking status: Former Smoker    Packs/day: 1.00    Years: 30.00    Types: Cigarettes    Quit date: 07/25/2000  . Smokeless tobacco: Never Used     Comment: Quit smoking in 2002  . Alcohol use No     Comment: stopped  drinking alcohol  . Drug use: No  . Sexual activity: Yes   Other Topics Concern  . Not on file   Social History Narrative   Lives  in [place], [place] with wife.     Allergies: Allergies  Allergen Reactions  . Codeine Other (See Comments) and Palpitations    Hurting in chest  . Diltiazem Hcl Itching  . Niacin Other (See Comments)    Felt a "severe burning sensation" Hot flashes  . Aspirin     History of Bleeding ulcers   . Diltiazem Hcl Other (See Comments) and Hives    Gets hot    Medications:  Current Outpatient Prescriptions  Medication Sig Dispense Refill  . acetaminophen (TYLENOL) 500 MG tablet Take 1,000 mg by mouth at bedtime.     [name] allopurinol (ZYLOPRIM) 100 MG tablet TAKE 1 TABLET DAILY 30 tablet 6  . atorvastatin (LIPITOR) 20 MG tablet Take 20 mg by mouth daily at 6 PM.     . benzonatate (TESSALON) 100 MG capsule Take 100 mg by mouth 3 (three) times daily as needed for cough.    . colchicine 0.6 MG tablet Take 0.6 mg by mouth daily.     . ferrous gluconate (FERGON) 324 MG tablet Take 324 mg by mouth 2 (two) times daily with a meal.    . gabapentin (NEURONTIN) 300 MG capsule Take 300 mg by mouth 3 (three) times daily.  6  . insulin NPH Human (HUMULIN N,NOVOLIN N) 100 UNIT/ML injection Inject 65 Units into the skin 2 (two) times daily before a meal.     . insulin regular (NOVOLIN R,HUMULIN R) 100 units/mL injection Inject 30 Units into the skin 2 (two) times daily before a meal.     . methocarbamol (ROBAXIN) 500 MG tablet Take 1 tablet (500 mg total) by mouth 4 (four) times daily. As needed for muscle spasm (Patient not taking: Reported on 04/11/2017) 30 tablet 1  . metolazone (ZAROXOLYN) 5 MG tablet Take 1 tablet (5 mg total) by mouth every other day. 15 tablet 3  . metoprolol succinate (TOPROL-XL) 100 MG 24 hr tablet TAKE 1 TABLET (100 MG TOTAL) BY MOUTH 2 (TWO) TIMES DAILY. 60 tablet 6  . Multiple Vitamins-Minerals ([employer] SPECTRAVITE ADULT 50+ PO) Take 1 tablet by mouth  daily.    [name] omeprazole (PRILOSEC) 20 MG capsule Take 1 capsule (20 mg total) by mouth 2 (two) times daily before a meal. 30 capsule 5  . potassium chloride SA (K-DUR,KLOR-CON) 20 MEQ tablet Take 2 tablets (40 mEq total) by mouth 2 (two) times daily. Take an extra 2 tablets on M,W,Sa with metolazone 500 tablet 6  . pregabalin (LYRICA) 75 MG capsule Take 75 mg by mouth at bedtime.     . sertraline (ZOLOFT) 50 MG tablet Take 50 mg by mouth daily.    [name] spironolactone (ALDACTONE) 50 MG tablet TAKE 1 TABLET (50 MG TOTAL) BY MOUTH 2 (TWO) TIMES DAILY. 60 tablet 3  . tamsulosin (FLOMAX) 0.4 MG CAPS capsule TAKE ONE CAPSULE BY MOUTH EVERY DAY AFTER SUPPER 30 capsule 3  . torsemide (DEMADEX) 20 MG tablet Take 6 tablets (120 mg total) by mouth 2 (two) times daily. 360 tablet 3  . vitamin C (ASCORBIC ACID) 500 MG tablet Take 500 mg by mouth 2 (two) times daily.     No current facility-administered medications for this visit.     Review of Systems: Review of Systems  All other systems reviewed and are negative.    PHYSICAL EXAMINATION Blood pressure (!) 95/55, pulse 74, temperature 97.8 F (36.6 C), temperature source Oral, resp. rate 18, height 5' 8"  (1.727 m), weight 280 lb 4.8 oz (127.1 kg),  SpO2 98 %.  ECOG PERFORMANCE STATUS: 2 - Symptomatic, <50% confined to bed  Physical Exam  Constitutional: He is oriented to person, place, and time and well-developed, well-nourished, and in no distress.  HENT:  Head: Normocephalic and atraumatic.  Mouth/Throat: Oropharynx is clear and moist and mucous membranes are normal.  Eyes: Pupils are equal, round, and reactive to light. Conjunctivae and EOM are normal. No scleral icterus.  Neck: No thyromegaly present.  Cardiovascular: Normal rate and regular rhythm.   Murmur heard. 2/6 early systolic murmur appreciated over the base of the heart  Pulmonary/Chest: Effort normal and breath sounds normal. No respiratory distress. He has no wheezes.  Abdominal:  Soft. Bowel sounds are normal. He exhibits no distension and no mass. There is no tenderness.  Musculoskeletal: He exhibits no edema.  Lymphadenopathy:    He has no cervical adenopathy.  Neurological: He is alert and oriented to person, place, and time. No cranial nerve deficit.  Skin: Skin is warm and dry. No rash noted. No erythema. No pallor.     LABORATORY DATA: I have personally reviewed the data as listed: Appointment on 04/11/2017  Component Date Value Ref Range Status  . WBC 04/11/2017 7.4  4.0 - 10.3 10e3/uL Final  . NEUT# 04/11/2017 5.8  1.5 - 6.5 10e3/uL Final  . HGB 04/11/2017 10.0* 13.0 - 17.1 g/dL Final  . HCT 04/11/2017 33.1* 38.4 - 49.9 % Final  . Platelets 04/11/2017 167  140 - 400 10e3/uL Final  . MCV 04/11/2017 83.2  79.3 - 98.0 fL Final  . MCH 04/11/2017 25.1* 27.2 - 33.4 pg Final  . MCHC 04/11/2017 30.2* 32.0 - 36.0 g/dL Final  . RBC 04/11/2017 3.98* 4.20 - 5.82 10e6/uL Final  . RDW 04/11/2017 17.5* 11.0 - 14.6 % Final  . lymph# 04/11/2017 0.7* 0.9 - 3.3 10e3/uL Final  . MONO# 04/11/2017 0.6  0.1 - 0.9 10e3/uL Final  . Eosinophils Absolute 04/11/2017 0.2  0.0 - 0.5 10e3/uL Final  . Basophils Absolute 04/11/2017 0.1  0.0 - 0.1 10e3/uL Final  . NEUT% 04/11/2017 78.3* 39.0 - 75.0 % Final  . LYMPH% 04/11/2017 9.8* 14.0 - 49.0 % Final  . MONO% 04/11/2017 8.4  0.0 - 14.0 % Final  . EOS% 04/11/2017 2.3  0.0 - 7.0 % Final  . BASO% 04/11/2017 1.2  0.0 - 2.0 % Final  . nRBC 04/11/2017 0  0 - 0 % Final  . Retic % 04/11/2017 4.28* 0.80 - 1.80 % Final  . Retic Ct Abs 04/11/2017 170.34* 34.80 - 93.90 10e3/uL Final  . Immature Retic Fract 04/11/2017 30.50* 3.00 - 10.60 % Final  . Sodium 04/11/2017 133* 136 - 145 mEq/L Final  . Potassium 04/11/2017 4.9  3.5 - 5.1 mEq/L Final  . Chloride 04/11/2017 99  98 - 109 mEq/L Final  . CO2 04/11/2017 22  22 - 29 mEq/L Final  . Glucose 04/11/2017 208* 70 - 140 mg/dl Final   Glucose reference range is for nonfasting patients. Fasting  glucose reference range is 70- 100.  [name] BUN 04/11/2017 72.0* 7.0 - 26.0 mg/dL Final  . Creatinine 04/11/2017 2.4* 0.7 - 1.3 mg/dL Final  . Total Bilirubin 04/11/2017 0.59  0.20 - 1.20 mg/dL Final  . Alkaline Phosphatase 04/11/2017 82  40 - 150 U/L Final  . AST 04/11/2017 17  5 - 34 U/L Final  . ALT 04/11/2017 12  0 - 55 U/L Final  . Total Protein 04/11/2017 7.7  6.4 - 8.3 g/dL Final  .  Albumin 04/11/2017 3.6  3.5 - 5.0 g/dL Final  . Calcium 04/11/2017 9.8  8.4 - 10.4 mg/dL Final  . Anion Gap 04/11/2017 11  3 - 11 mEq/L Final  . EGFR 04/11/2017 27* >90 ml/min/1.73 m2 Final   eGFR is calculated using the CKD-EPI Creatinine Equation (2009)  . LDH 04/11/2017 188  125 - 245 U/L Final  . Ferritin 04/11/2017 43  22 - 316 ng/ml Final  . Iron 04/11/2017 110  42 - 163 ug/dL Final  . TIBC 04/11/2017 508* 202 - 409 ug/dL Final  . UIBC 04/11/2017 398* 117 - 376 ug/dL Final  . %SAT 04/11/2017 22  20 - 55 % Final  . Folate 04/11/2017 >20.0  >3.0 ng/mL Final   Comment: A serum folate concentration of less than 3.1 ng/mL is considered to represent clinical deficiency.   . Vitamin B12 04/11/2017 1,167  232 - 1245 pg/mL Final  . Haptoglobin 04/11/2017 213* 34 - 200 mg/dL Final  . TSH 04/11/2017 3.242  0.320 - 4.118 m(IU)/L Final       Ardath Sax, MD

## 2017-04-17 DIAGNOSIS — I130 Hypertensive heart and chronic kidney disease with heart failure and stage 1 through stage 4 chronic kidney disease, or unspecified chronic kidney disease: Secondary | ICD-10-CM | POA: Diagnosis not present

## 2017-04-17 DIAGNOSIS — K7460 Unspecified cirrhosis of liver: Secondary | ICD-10-CM | POA: Diagnosis not present

## 2017-04-17 DIAGNOSIS — E1122 Type 2 diabetes mellitus with diabetic chronic kidney disease: Secondary | ICD-10-CM | POA: Diagnosis not present

## 2017-04-17 DIAGNOSIS — N189 Chronic kidney disease, unspecified: Secondary | ICD-10-CM | POA: Diagnosis not present

## 2017-04-17 DIAGNOSIS — I2510 Atherosclerotic heart disease of native coronary artery without angina pectoris: Secondary | ICD-10-CM | POA: Diagnosis not present

## 2017-04-17 DIAGNOSIS — I5032 Chronic diastolic (congestive) heart failure: Secondary | ICD-10-CM | POA: Diagnosis not present

## 2017-04-18 ENCOUNTER — Other Ambulatory Visit (HOSPITAL_COMMUNITY): Payer: Self-pay | Admitting: Cardiology

## 2017-04-18 ENCOUNTER — Ambulatory Visit (HOSPITAL_COMMUNITY)
Admission: RE | Admit: 2017-04-18 | Discharge: 2017-04-18 | Disposition: A | Discharge status: Home / Self Care | Payer: Medicare Other | Source: Ambulatory Visit | Attending: Cardiology | Admitting: Cardiology

## 2017-04-18 DIAGNOSIS — I509 Heart failure, unspecified: Secondary | ICD-10-CM | POA: Diagnosis not present

## 2017-04-18 LAB — BASIC METABOLIC PANEL
ANION GAP: 15 (ref 5–15)
BUN: 90 mg/dL — AB (ref 6–20)
CALCIUM: 9.1 mg/dL (ref 8.9–10.3)
CO2: 18 mmol/L — AB (ref 22–32)
Chloride: 95 mmol/L — ABNORMAL LOW (ref 101–111)
Creatinine, Ser: 2.61 mg/dL — ABNORMAL HIGH (ref 0.61–1.24)
GFR calc Af Amer: 27 mL/min — ABNORMAL LOW (ref >60)
GFR calc non Af Amer: 24 mL/min — ABNORMAL LOW (ref >60)
GLUCOSE: 203 mg/dL — AB (ref 65–99)
POTASSIUM: 5.2 mmol/L — AB (ref 3.5–5.1)
Sodium: 128 mmol/L — ABNORMAL LOW (ref 135–145)

## 2017-04-18 MED ORDER — ALLOPURINOL 100 MG PO TABS: 100.0000 mg | ORAL_TABLET | Freq: Every day | ORAL | 3 refills | Status: AC

## 2017-04-19 ENCOUNTER — Telehealth (HOSPITAL_COMMUNITY): Payer: Self-pay

## 2017-04-19 DIAGNOSIS — K7460 Unspecified cirrhosis of liver: Secondary | ICD-10-CM | POA: Diagnosis not present

## 2017-04-19 DIAGNOSIS — I2510 Atherosclerotic heart disease of native coronary artery without angina pectoris: Secondary | ICD-10-CM | POA: Diagnosis not present

## 2017-04-19 DIAGNOSIS — I5032 Chronic diastolic (congestive) heart failure: Secondary | ICD-10-CM | POA: Diagnosis not present

## 2017-04-19 DIAGNOSIS — N189 Chronic kidney disease, unspecified: Secondary | ICD-10-CM | POA: Diagnosis not present

## 2017-04-19 DIAGNOSIS — E1122 Type 2 diabetes mellitus with diabetic chronic kidney disease: Secondary | ICD-10-CM | POA: Diagnosis not present

## 2017-04-19 DIAGNOSIS — I509 Heart failure, unspecified: Secondary | ICD-10-CM

## 2017-04-19 DIAGNOSIS — I130 Hypertensive heart and chronic kidney disease with heart failure and stage 1 through stage 4 chronic kidney disease, or unspecified chronic kidney disease: Secondary | ICD-10-CM | POA: Diagnosis not present

## 2017-04-19 MED ORDER — POTASSIUM CHLORIDE CRYS ER 20 MEQ PO TBCR: 20.0000 meq | EXTENDED_RELEASE_TABLET | Freq: Two times a day (BID) | ORAL | 6 refills | Status: DC

## 2017-04-19 NOTE — Telephone Encounter (Signed)
Notes recorded by Shirley Muscat, RN on 04/19/2017 at 9:38 AM EDT Pt aware of results and agreeable to med changes. Lab appt made for 10/4 at 10 am for BMET Med changes made in [facility]  ------  Notes recorded by Larey Dresser, MD on 04/18/2017 at 1:52 PM EDT Decrease KCl to 20 bid if he is taking it 40 bid currently. Repeat BMET in 1 week

## 2017-04-20 ENCOUNTER — Ambulatory Visit (HOSPITAL_BASED_OUTPATIENT_CLINIC_OR_DEPARTMENT_OTHER): Payer: Medicare Other

## 2017-04-20 ENCOUNTER — Ambulatory Visit (HOSPITAL_BASED_OUTPATIENT_CLINIC_OR_DEPARTMENT_OTHER): Payer: Medicare Other | Admitting: Hematology and Oncology

## 2017-04-20 ENCOUNTER — Telehealth: Payer: Self-pay | Admitting: Hematology and Oncology

## 2017-04-20 VITALS — BP 94/49 | HR 75 | Temp 97.9°F | Resp 20 | Ht 68.0 in | Wt 288.3 lb

## 2017-04-20 DIAGNOSIS — D631 Anemia in chronic kidney disease: Secondary | ICD-10-CM | POA: Diagnosis not present

## 2017-04-20 DIAGNOSIS — N183 Chronic kidney disease, stage 3 unspecified: Secondary | ICD-10-CM

## 2017-04-20 DIAGNOSIS — D638 Anemia in other chronic diseases classified elsewhere: Secondary | ICD-10-CM

## 2017-04-20 DIAGNOSIS — D649 Anemia, unspecified: Secondary | ICD-10-CM

## 2017-04-20 DIAGNOSIS — D500 Iron deficiency anemia secondary to blood loss (chronic): Secondary | ICD-10-CM

## 2017-04-20 DIAGNOSIS — D696 Thrombocytopenia, unspecified: Secondary | ICD-10-CM | POA: Diagnosis not present

## 2017-04-20 NOTE — Telephone Encounter (Signed)
Scheduled appt per 9/27 los - Gave patient AVS and calender per los.  

## 2017-04-21 LAB — ERYTHROPOIETIN: ERYTHROPOIETIN: 22.7 m[IU]/mL — AB (ref 2.6–18.5)

## 2017-04-25 ENCOUNTER — Other Ambulatory Visit (HOSPITAL_COMMUNITY): Payer: Self-pay | Admitting: Cardiology

## 2017-04-25 DIAGNOSIS — N183 Chronic kidney disease, stage 3 unspecified: Secondary | ICD-10-CM | POA: Insufficient documentation

## 2017-04-25 DIAGNOSIS — D631 Anemia in chronic kidney disease: Secondary | ICD-10-CM | POA: Insufficient documentation

## 2017-04-25 MED ORDER — SPIRONOLACTONE 50 MG PO TABS: 50.0000 mg | ORAL_TABLET | Freq: Two times a day (BID) | ORAL | 3 refills | Status: DC

## 2017-04-25 NOTE — Assessment & Plan Note (Signed)
71 y.o. male with extensive comorbidities including congestive heart failure, replaced aortic valve, hepatic cirrhosis and history of gastrointestinal bleeding due to portal gastropathy. Presenting with moderate anemia that appears to be reasonably chronic, but the most recent value obtained last week seems to be slightly worse than the baseline.  Anemia appears to be normocytic normochromic and likely represents a combination of several etiologies including iron deficiency anemia due to chronic blood loss, anemia due to chronic disease possible anemia of chronic renal insufficiency, anemia due to hepatic dysfunction. And values obtained during the last visit to the clinic appear to support the presence of iron deficiency. Ferritin is 43 that would be considered a normal number in the healthy individual, but the presence of renal insufficiency and hepatic dysfunction, I would expect this number to be substantially higher. I would consider anything below 100 ferritin to be a state of iron depletion in this particular patient.  Our lab work demonstrates no evidence of B12 or folate deficiency, haptoglobin is normal ruling out significant level of hemolysis. We will obtain additional lapsed of the process state of renal function in terms of her erythropoietin production.  Plan: --Labs today --continue oral iron supplementation. --RTC 3 month for hematological monitoring

## 2017-04-25 NOTE — Progress Notes (Signed)
[facility] Follow-up Visit:  Assessment: Iron deficiency anemia 71 y.o. male with extensive comorbidities including congestive heart failure, replaced aortic valve, hepatic cirrhosis and history of gastrointestinal bleeding due to portal gastropathy. Presenting with moderate anemia that appears to be reasonably chronic, but the most recent value obtained last week seems to be slightly worse than the baseline.  Anemia appears to be normocytic normochromic and likely represents a combination of several etiologies including iron deficiency anemia due to chronic blood loss, anemia due to chronic disease possible anemia of chronic renal insufficiency, anemia due to hepatic dysfunction. And values obtained during the last visit to the clinic appear to support the presence of iron deficiency. Ferritin is 43 that would be considered a normal number in the healthy individual, but the presence of renal insufficiency and hepatic dysfunction, I would expect this number to be substantially higher. I would consider anything below 100 ferritin to be a state of iron depletion in this particular patient.  Our lab work demonstrates no evidence of B12 or folate deficiency, haptoglobin is normal ruling out significant level of hemolysis. We will obtain additional lapsed of the process state of renal function in terms of her erythropoietin production.  Plan: --Labs today --continue oral iron supplementation. --RTC 3 month for hematological monitoring  Voice recognition software was used and creation of this note. Despite my best effort at editing the text, some misspelling/errors may have occurred.   Orders Placed This Encounter  Procedures  . Erythropoietin    Standing Status:   Future    Number of Occurrences:   1    Standing Expiration Date:   04/20/2018  . CBC & Diff and Retic    Standing Status:   Future    Standing Expiration Date:   04/20/2018  . Ferritin    Standing Status:    Future    Standing Expiration Date:   04/20/2018  . Iron and TIBC    Standing Status:   Future    Standing Expiration Date:   04/20/2018  . Lactate dehydrogenase (LDH)    Standing Status:   Future    Standing Expiration Date:   04/20/2018  . Comprehensive metabolic panel    Standing Status:   Future    Standing Expiration Date:   04/20/2018    All questions were answered.  . The patient knows to call the clinic with any problems, questions or concerns.  This note was electronically signed.    History of Presenting Illness [name] is a 71 y.o. male followed in [name] for thrombocytopenia and anemia. 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 Please see the results of the recent lab work below for details.  The present time, patient reports being at the baseline of his health. Denies any interval increase in fatigue. He does have previous history of gastrointestinal bleeding had previous endoscopic evaluations revealing bleeding polyps in the stomach as well as chronic gastritis with varices. He also complains of chronic and stable dyspnea on exertion.  Oncological/hematological History: --Labs, 01/14/16: WBC 7.4, Hgb 11.2, MCV 82.4, MCH 25.3, RDW 18.5, Plt 180;  --Labs, 06/28/16: WBC 6.4, Hgb 10.7, MCV 82.5, MCH 25.8, RDW 17.9, Plt 164;  --Colonoscopy/EGD, Apr 2018: Grade 1 esophageal varices. 0.7 cm sessile polyp in the gastric body with active bleeding, 2 polyps  in the prepyloric stomach with evidence of bleeding. Gastropathy with vascular ectasia. Colonoscopy significant for splenic flexure lipoma and external hemorrhoids without evidence of active bleeding. --Labs, 10/26/16: WBC 7.6, Hgb 10.2, MCV 82.7, MCH 25.2, RDW 18.7, Plt 117;  --Labs,  02/21/17: WBC 8.7, Hgb   9.6, MCV 88.3, MCH 27.5, RDW 18.6, Plt 137;  --Labs, 04/04/17: WBC 8.1, Hgb   9.5, MCV 83.9, MCH 25.1, RDW 18.0, Plt 188; Cr 2.52 --Labs, 04/11/17: WBC 7.4, Hgb 10.0, MCV 83.2, MCH 25.1, RDW 17.5, Plt 167; Fe 110, FeSat 22%, TIBC 508, Ferritin 43, Folate >20, Vit B12 1167; Haptoglobin 213; TSH 3.24  Medical History: Past Medical History:  Diagnosis Date  . Aortic stenosis 03/08/2012   a.  s/p tissue AVR 10/13 with Dr. [name];   b. Echo 10/13: mod LVH, EF 55-60%, tissue AVR not well seen, no leak, gradient not too high (mean 14mHg), MAC, mild MR, mild LAE, PASP 38  . Ascites    status post paracentesis with removal of 3.4 L of ascitic fluid  . Atrial fibrillation ([facility]    Permanent; off of Coumadin for now due to GI bleed  . AVM (arteriovenous malformation)    Recurrent GI bleeding requiring multiple transfusions  . CAD (coronary artery disease)    a. s/p CABG 2004;  b. LMinerva10/13:  LHC 8/13: Free radial to obtuse marginal patent, SVG-diagonal patent, LIMA-LAD patent, EF 65-70%, mean aortic valve gradient 42  . Carotid bruit 06/14/2011   a. pre-AVR dopplers 10/13: no sig ICA stenosis  . Chronic diastolic heart failure ([facility]   . Cirrhosis ([facility]   . COPD (chronic obstructive pulmonary disease) ([facility]   . DM2 (diabetes mellitus, type 2) (HCC)    at least 10 yrs  . GERD (gastroesophageal reflux disease)   . H/O hiatal hernia   . Hyperlipidemia   . Hypertension    x 15 yrs  . Insomnia   . Iron deficiency anemia    Requiring intravenous iron  . Mediastinal adenopathy 09/22/2011  . OSA (obstructive sleep apnea) 1999    USES CPAP  . Osteoarthritis   . Overweight(278.02)   . Thrombocytopenia ([facility]     Surgical History: Past Surgical History:  Procedure Laterality Date  . ABLATION  04-18-14   AVN ablation by Dr KCaryl Comes . AORTIC VALVE REPLACEMENT  04/25/2012   Procedure: AORTIC VALVE REPLACEMENT (AVR);  Surgeon: SMelrose Nakayama MD;  [place]   Service: Open Heart Surgery;  Laterality: N/A;  . AV NODE ABLATION N/A 04/18/2014   Procedure: AV NODE ABLATION;  Surgeon: SDeboraha Sprang MD;  [facility];  Service: Cardiovascular;  Laterality: N/A;  . CARPAL TUNNEL RELEASE    10/08/2003  . CATARACT EXTRACTION W/ INTRAOCULAR LENS IMPLANT Bilateral 09/28/2015 , 10/19/2015  . COLONOSCOPY WITH PROPOFOL N/A 10/28/2016   Procedure: COLONOSCOPY WITH PROPOFOL;  Surgeon: NRogene Houston MD;  [facility];  Service: Endoscopy;  Laterality: N/A;  . CORONARY ANGIOPLASTY WITH STENT PLACEMENT  01/19/2005   drug eluting stent to high grade ostial stenosis of radial artery graft to OM  . CORONARY ARTERY BYPASS GRAFT   10/15/2002    SRevonda Standard HRoxan Hockey M.D.     . ESOPHAGOGASTRODUODENOSCOPY N/A 07/31/2013   Procedure: ESOPHAGOGASTRODUODENOSCOPY (EGD);  Surgeon: DMilus Banister MD;  [place]  Service: Endoscopy;  Laterality: N/A;  . ESOPHAGOGASTRODUODENOSCOPY Left 01/11/2014   Procedure: ESOPHAGOGASTRODUODENOSCOPY (EGD);  Surgeon: JJuanita Craver MD;  [place] [facility]  Service:  Endoscopy;  Laterality: Left;  . ESOPHAGOGASTRODUODENOSCOPY (EGD) WITH PROPOFOL N/A 10/28/2016   Procedure: ESOPHAGOGASTRODUODENOSCOPY (EGD) WITH PROPOFOL;  Surgeon: Rogene Houston, MD;  [facility];  Service: Endoscopy;  Laterality: N/A;  . HERNIA REPAIR    . KNEE ARTHROSCOPY Right 01/20/2016   Procedure: ARTHROSCOPY RIGHT KNEE WITH MENSICAL DEBRIDEMENT;  Surgeon: Gaynelle Arabian, MD;  [facility];  Service: Orthopedics;  Laterality: Right;  . LEFT AND RIGHT HEART CATHETERIZATION WITH CORONARY/GRAFT ANGIOGRAM N/A 03/07/2012   Procedure: LEFT AND RIGHT HEART CATHETERIZATION WITH Beatrix Fetters;  Surgeon: Sherren Mocha, MD;  Location: [facility] CATH LAB;  Service: Cardiovascular;  Laterality: N/A;  . lipoma surgery    . OTHER SURGICAL HISTORY  08/26/2011   [facility],  enteroscopy , revealing "three-four AVMs."   . PACEMAKER INSERTION   03-24-14   STJ Assurity single chamber pacemaker implanted by Dr Caryl Comes  . PARACENTESIS  12/2015  . PERMANENT PACEMAKER INSERTION N/A 03/24/2014   Procedure: PERMANENT PACEMAKER INSERTION;  Surgeon: Deboraha Sprang, MD;  [facility] LAB;  Service: Cardiovascular;  Laterality: N/A;  . POLYPECTOMY  10/28/2016   Procedure: POLYPECTOMY;  Surgeon: Rogene Houston, MD;  [facility];  Service: Endoscopy;;  gastric  . RIGHT HEART CATHETERIZATION N/A 01/01/2014   Procedure: RIGHT HEART CATH;  Surgeon: Jolaine Artist, MD;  Location: [facility] CATH LAB;  Service: Cardiovascular;  Laterality: N/A;  . TEE WITHOUT CARDIOVERSION  03/07/2012   Procedure: TRANSESOPHAGEAL ECHOCARDIOGRAM (TEE);  Surgeon: Thayer Headings, MD;  [facility] ENDOSCOPY;  Service: Cardiovascular;  Laterality: N/A;    Family History: Family History  Problem Relation Age of Onset  . Hypertension Father   . Diabetes Father   . Heart disease Father   . COPD Sister   . Cancer Maternal Aunt        Breast cancer   . Cancer Maternal Aunt        Breast cancer   . Diabetes Son   . Cancer Daughter        Cervical cancer  . Coronary artery disease Unknown        FAMILY HISTORY    Social History: Social History   Social History  . Marital status: Married    Spouse name: N/A  . Number of children: 3  . Years of education: N/A   Occupational History  .      [employer] History Main Topics  . Smoking status: Former Smoker    Packs/day: 1.00    Years: 30.00    Types: Cigarettes    Quit date: 07/25/2000  . Smokeless tobacco: Never Used     Comment: Quit smoking in 2002  . Alcohol use No     Comment: stopped drinking alcohol  . Drug use: No  . Sexual activity: Yes   Other Topics Concern  . Not on file   Social History Narrative   Lives in [place], [place] with wife.     Allergies: Allergies  Allergen Reactions  . Codeine Other (See Comments) and Palpitations    Hurting in chest  .  Diltiazem Hcl Itching  . Niacin Other (See Comments)    Felt a "severe burning sensation" Hot flashes  . Aspirin     History of Bleeding ulcers   . Diltiazem Hcl Other (See Comments) and Hives    Gets hot    Medications:  Current Outpatient Prescriptions  Medication Sig Dispense Refill  . acetaminophen (TYLENOL) 500  MG tablet Take 1,000 mg by mouth at bedtime.     [name] allopurinol (ZYLOPRIM) 100 MG tablet Take 1 tablet (100 mg total) by mouth daily. 90 tablet 3  . atorvastatin (LIPITOR) 20 MG tablet Take 20 mg by mouth daily at 6 PM.     . benzonatate (TESSALON) 100 MG capsule Take 100 mg by mouth 3 (three) times daily as needed for cough.    . colchicine 0.6 MG tablet Take 0.6 mg by mouth daily.     . ferrous gluconate (FERGON) 324 MG tablet Take 324 mg by mouth 2 (two) times daily with a meal.    . gabapentin (NEURONTIN) 300 MG capsule Take 300 mg by mouth 3 (three) times daily.  6  . insulin NPH Human (HUMULIN N,NOVOLIN N) 100 UNIT/ML injection Inject 65 Units into the skin 2 (two) times daily before a meal.     . insulin regular (NOVOLIN R,HUMULIN R) 100 units/mL injection Inject 30 Units into the skin 2 (two) times daily before a meal.     . methocarbamol (ROBAXIN) 500 MG tablet Take 1 tablet (500 mg total) by mouth 4 (four) times daily. As needed for muscle spasm (Patient not taking: Reported on 04/11/2017) 30 tablet 1  . metolazone (ZAROXOLYN) 5 MG tablet Take 1 tablet (5 mg total) by mouth every other day. 15 tablet 3  . metoprolol succinate (TOPROL-XL) 100 MG 24 hr tablet TAKE 1 TABLET (100 MG TOTAL) BY MOUTH 2 (TWO) TIMES DAILY. 60 tablet 6  . Multiple Vitamins-Minerals ([employer] SPECTRAVITE ADULT 50+ PO) Take 1 tablet by mouth daily.    [name] omeprazole (PRILOSEC) 20 MG capsule Take 1 capsule (20 mg total) by mouth 2 (two) times daily before a meal. 30 capsule 5  . potassium chloride SA (K-DUR,KLOR-CON) 20 MEQ tablet Take 1 tablet (20 mEq total) by mouth 2 (two) times daily. Take an extra 2  tablets on M,W,Sa with metolazone 500 tablet 6  . spironolactone (ALDACTONE) 50 MG tablet Take 1 tablet (50 mg total) by mouth 2 (two) times daily. 180 tablet 3  . tamsulosin (FLOMAX) 0.4 MG CAPS capsule TAKE ONE CAPSULE BY MOUTH EVERY DAY AFTER SUPPER 30 capsule 3  . torsemide (DEMADEX) 20 MG tablet Take 6 tablets (120 mg total) by mouth 2 (two) times daily. 360 tablet 3  . vitamin C (ASCORBIC ACID) 500 MG tablet Take 500 mg by mouth 2 (two) times daily.     No current facility-administered medications for this visit.     Review of Systems: Review of Systems  Respiratory: Positive for shortness of breath.   All other systems reviewed and are negative.    PHYSICAL EXAMINATION Blood pressure (!) 94/49, pulse 75, temperature 97.9 F (36.6 C), temperature source Oral, resp. rate 20, height 5' 8" (1.727 m), weight 288 lb 4.8 oz (130.8 kg), SpO2 98 %.  ECOG PERFORMANCE STATUS: 1 - Symptomatic but completely ambulatory  Physical Exam  Constitutional: He is oriented to person, place, and time and well-developed, well-nourished, and in no distress.  HENT:  Head: Normocephalic and atraumatic.  Mouth/Throat: Oropharynx is clear and moist and mucous membranes are normal.  Eyes: Pupils are equal, round, and reactive to light. Conjunctivae and EOM are normal. No scleral icterus.  Neck: No thyromegaly present.  Cardiovascular: Normal rate and regular rhythm.   Murmur heard. 2/6 early systolic murmur appreciated over the base of the heart  Pulmonary/Chest: Effort normal and breath sounds normal. No respiratory distress. He has no wheezes.  Abdominal: Soft. Bowel sounds are normal. He exhibits no distension and no mass. There is no tenderness.  Musculoskeletal: He exhibits no edema.  Lymphadenopathy:    He has no cervical adenopathy.  Neurological: He is alert and oriented to person, place, and time. No cranial nerve deficit.  Skin: Skin is warm and dry. No rash noted. No erythema. No pallor.      LABORATORY DATA: I have personally reviewed the data as listed: Appointment on 04/20/2017  Component Date Value Ref Range Status  . Erythropoietin 04/20/2017 22.7* 2.6 - 18.5 mIU/mL Final   [facility] Outpatient Visit on 04/18/2017  Component Date Value Ref Range Status  . Sodium 04/18/2017 128* 135 - 145 mmol/L Final  . Potassium 04/18/2017 5.2* 3.5 - 5.1 mmol/L Final  . Chloride 04/18/2017 95* 101 - 111 mmol/L Final  . CO2 04/18/2017 18* 22 - 32 mmol/L Final  . Glucose, Bld 04/18/2017 203* 65 - 99 mg/dL Final  . BUN 04/18/2017 90* 6 - 20 mg/dL Final  . Creatinine, Ser 04/18/2017 2.61* 0.61 - 1.24 mg/dL Final  . Calcium 04/18/2017 9.1  8.9 - 10.3 mg/dL Final  . GFR calc non Af Amer 04/18/2017 24* >60 mL/min Final  . GFR calc Af Amer 04/18/2017 27* >60 mL/min Final   Comment: (NOTE) The eGFR has been calculated using the CKD EPI equation. This calculation has not been validated in all clinical situations. eGFR's persistently <60 mL/min signify possible Chronic Kidney Disease.   Georgiann Hahn gap 04/18/2017 15  5 - 15 Final       Ardath Sax, MD

## 2017-04-26 ENCOUNTER — Encounter (HOSPITAL_COMMUNITY): Payer: Self-pay

## 2017-04-26 ENCOUNTER — Ambulatory Visit (HOSPITAL_COMMUNITY)
Admission: RE | Admit: 2017-04-26 | Discharge: 2017-04-26 | Disposition: A | Discharge status: Home / Self Care | Payer: Medicare Other | Source: Ambulatory Visit | Attending: Cardiology | Admitting: Cardiology

## 2017-04-26 DIAGNOSIS — R188 Other ascites: Secondary | ICD-10-CM | POA: Insufficient documentation

## 2017-04-26 NOTE — Progress Notes (Signed)
Paracentesis complete no signs of distress; 5124ml amber colored ascites removed.

## 2017-04-26 NOTE — Procedures (Signed)
  US guided RLQ paracentesis 5.1 liter dark yellow fluid  Tolerated well

## 2017-04-27 ENCOUNTER — Ambulatory Visit (HOSPITAL_COMMUNITY)
Admission: RE | Admit: 2017-04-27 | Discharge: 2017-04-27 | Disposition: A | Discharge status: Home / Self Care | Payer: Medicare Other | Source: Ambulatory Visit | Attending: Cardiology | Admitting: Cardiology

## 2017-04-27 DIAGNOSIS — I5022 Chronic systolic (congestive) heart failure: Secondary | ICD-10-CM | POA: Insufficient documentation

## 2017-04-27 LAB — BASIC METABOLIC PANEL
ANION GAP: 13 (ref 5–15)
BUN: 91 mg/dL — ABNORMAL HIGH (ref 6–20)
CALCIUM: 8.8 mg/dL — AB (ref 8.9–10.3)
CO2: 22 mmol/L (ref 22–32)
CREATININE: 2.78 mg/dL — AB (ref 0.61–1.24)
Chloride: 93 mmol/L — ABNORMAL LOW (ref 101–111)
GFR calc non Af Amer: 22 mL/min — ABNORMAL LOW (ref >60)
GFR, EST AFRICAN AMERICAN: 25 mL/min — AB (ref >60)
Glucose, Bld: 284 mg/dL — ABNORMAL HIGH (ref 65–99)
Potassium: 4 mmol/L (ref 3.5–5.1)
SODIUM: 128 mmol/L — AB (ref 135–145)

## 2017-05-03 DIAGNOSIS — I130 Hypertensive heart and chronic kidney disease with heart failure and stage 1 through stage 4 chronic kidney disease, or unspecified chronic kidney disease: Secondary | ICD-10-CM | POA: Diagnosis not present

## 2017-05-03 DIAGNOSIS — K7460 Unspecified cirrhosis of liver: Secondary | ICD-10-CM | POA: Diagnosis not present

## 2017-05-03 DIAGNOSIS — I2510 Atherosclerotic heart disease of native coronary artery without angina pectoris: Secondary | ICD-10-CM | POA: Diagnosis not present

## 2017-05-03 DIAGNOSIS — E1122 Type 2 diabetes mellitus with diabetic chronic kidney disease: Secondary | ICD-10-CM | POA: Diagnosis not present

## 2017-05-03 DIAGNOSIS — I5032 Chronic diastolic (congestive) heart failure: Secondary | ICD-10-CM | POA: Diagnosis not present

## 2017-05-03 DIAGNOSIS — N189 Chronic kidney disease, unspecified: Secondary | ICD-10-CM | POA: Diagnosis not present

## 2017-05-05 DIAGNOSIS — I5032 Chronic diastolic (congestive) heart failure: Secondary | ICD-10-CM | POA: Diagnosis not present

## 2017-05-05 DIAGNOSIS — N189 Chronic kidney disease, unspecified: Secondary | ICD-10-CM | POA: Diagnosis not present

## 2017-05-05 DIAGNOSIS — K7460 Unspecified cirrhosis of liver: Secondary | ICD-10-CM | POA: Diagnosis not present

## 2017-05-05 DIAGNOSIS — I2510 Atherosclerotic heart disease of native coronary artery without angina pectoris: Secondary | ICD-10-CM | POA: Diagnosis not present

## 2017-05-05 DIAGNOSIS — E1122 Type 2 diabetes mellitus with diabetic chronic kidney disease: Secondary | ICD-10-CM | POA: Diagnosis not present

## 2017-05-05 DIAGNOSIS — I130 Hypertensive heart and chronic kidney disease with heart failure and stage 1 through stage 4 chronic kidney disease, or unspecified chronic kidney disease: Secondary | ICD-10-CM | POA: Diagnosis not present

## 2017-05-08 ENCOUNTER — Telehealth (HOSPITAL_COMMUNITY): Payer: Self-pay | Admitting: *Deleted

## 2017-05-08 DIAGNOSIS — R188 Other ascites: Secondary | ICD-10-CM

## 2017-05-08 DIAGNOSIS — N189 Chronic kidney disease, unspecified: Secondary | ICD-10-CM | POA: Diagnosis not present

## 2017-05-08 DIAGNOSIS — I2510 Atherosclerotic heart disease of native coronary artery without angina pectoris: Secondary | ICD-10-CM | POA: Diagnosis not present

## 2017-05-08 DIAGNOSIS — I130 Hypertensive heart and chronic kidney disease with heart failure and stage 1 through stage 4 chronic kidney disease, or unspecified chronic kidney disease: Secondary | ICD-10-CM | POA: Diagnosis not present

## 2017-05-08 DIAGNOSIS — K7460 Unspecified cirrhosis of liver: Secondary | ICD-10-CM | POA: Diagnosis not present

## 2017-05-08 DIAGNOSIS — I5032 Chronic diastolic (congestive) heart failure: Secondary | ICD-10-CM | POA: Diagnosis not present

## 2017-05-08 DIAGNOSIS — E1122 Type 2 diabetes mellitus with diabetic chronic kidney disease: Secondary | ICD-10-CM | POA: Diagnosis not present

## 2017-05-08 NOTE — Telephone Encounter (Signed)
Pt called stating he needs a paracentesis.  Per chart pt has order for standing paracentesis when needed.  Order placed, pt sch for Wed 10/17 at 9 am, he is aware

## 2017-05-10 ENCOUNTER — Ambulatory Visit (HOSPITAL_COMMUNITY)
Admission: RE | Admit: 2017-05-10 | Discharge: 2017-05-10 | Disposition: A | Discharge status: Home / Self Care | Payer: Medicare Other | Source: Ambulatory Visit | Attending: Cardiology | Admitting: Cardiology

## 2017-05-10 ENCOUNTER — Ambulatory Visit (HOSPITAL_COMMUNITY): Payer: Medicare Other

## 2017-05-10 DIAGNOSIS — R188 Other ascites: Secondary | ICD-10-CM

## 2017-05-10 NOTE — Progress Notes (Signed)
Paracentesis complete no signs of distress, 4.8 yellow colored ascites removed.

## 2017-05-11 ENCOUNTER — Ambulatory Visit (HOSPITAL_COMMUNITY)
Admission: RE | Admit: 2017-05-11 | Discharge: 2017-05-11 | Disposition: A | Discharge status: Home / Self Care | Payer: Medicare Other | Source: Ambulatory Visit | Attending: Cardiology | Admitting: Cardiology

## 2017-05-11 ENCOUNTER — Encounter (HOSPITAL_COMMUNITY): Payer: Self-pay

## 2017-05-11 VITALS — BP 90/53 | HR 81 | Wt 282.0 lb

## 2017-05-11 DIAGNOSIS — I5022 Chronic systolic (congestive) heart failure: Secondary | ICD-10-CM

## 2017-05-11 DIAGNOSIS — Z79899 Other long term (current) drug therapy: Secondary | ICD-10-CM | POA: Diagnosis not present

## 2017-05-11 DIAGNOSIS — M109 Gout, unspecified: Secondary | ICD-10-CM | POA: Insufficient documentation

## 2017-05-11 DIAGNOSIS — I350 Nonrheumatic aortic (valve) stenosis: Secondary | ICD-10-CM | POA: Diagnosis not present

## 2017-05-11 DIAGNOSIS — I482 Chronic atrial fibrillation: Secondary | ICD-10-CM | POA: Insufficient documentation

## 2017-05-11 DIAGNOSIS — Z9889 Other specified postprocedural states: Secondary | ICD-10-CM | POA: Insufficient documentation

## 2017-05-11 DIAGNOSIS — K7460 Unspecified cirrhosis of liver: Secondary | ICD-10-CM

## 2017-05-11 DIAGNOSIS — G4733 Obstructive sleep apnea (adult) (pediatric): Secondary | ICD-10-CM | POA: Diagnosis not present

## 2017-05-11 DIAGNOSIS — I10 Essential (primary) hypertension: Secondary | ICD-10-CM | POA: Diagnosis not present

## 2017-05-11 DIAGNOSIS — K219 Gastro-esophageal reflux disease without esophagitis: Secondary | ICD-10-CM | POA: Insufficient documentation

## 2017-05-11 DIAGNOSIS — J449 Chronic obstructive pulmonary disease, unspecified: Secondary | ICD-10-CM | POA: Insufficient documentation

## 2017-05-11 DIAGNOSIS — E785 Hyperlipidemia, unspecified: Secondary | ICD-10-CM | POA: Diagnosis not present

## 2017-05-11 DIAGNOSIS — I25810 Atherosclerosis of coronary artery bypass graft(s) without angina pectoris: Secondary | ICD-10-CM | POA: Diagnosis not present

## 2017-05-11 DIAGNOSIS — I130 Hypertensive heart and chronic kidney disease with heart failure and stage 1 through stage 4 chronic kidney disease, or unspecified chronic kidney disease: Secondary | ICD-10-CM | POA: Diagnosis not present

## 2017-05-11 DIAGNOSIS — R188 Other ascites: Secondary | ICD-10-CM | POA: Diagnosis not present

## 2017-05-11 DIAGNOSIS — D696 Thrombocytopenia, unspecified: Secondary | ICD-10-CM | POA: Insufficient documentation

## 2017-05-11 DIAGNOSIS — Z794 Long term (current) use of insulin: Secondary | ICD-10-CM | POA: Insufficient documentation

## 2017-05-11 DIAGNOSIS — I2510 Atherosclerotic heart disease of native coronary artery without angina pectoris: Secondary | ICD-10-CM | POA: Diagnosis not present

## 2017-05-11 DIAGNOSIS — Z8249 Family history of ischemic heart disease and other diseases of the circulatory system: Secondary | ICD-10-CM | POA: Diagnosis not present

## 2017-05-11 DIAGNOSIS — Z951 Presence of aortocoronary bypass graft: Secondary | ICD-10-CM | POA: Insufficient documentation

## 2017-05-11 DIAGNOSIS — E1122 Type 2 diabetes mellitus with diabetic chronic kidney disease: Secondary | ICD-10-CM | POA: Insufficient documentation

## 2017-05-11 DIAGNOSIS — K761 Chronic passive congestion of liver: Secondary | ICD-10-CM | POA: Diagnosis not present

## 2017-05-11 DIAGNOSIS — I5032 Chronic diastolic (congestive) heart failure: Secondary | ICD-10-CM | POA: Diagnosis not present

## 2017-05-11 DIAGNOSIS — Z953 Presence of xenogenic heart valve: Secondary | ICD-10-CM | POA: Diagnosis not present

## 2017-05-11 DIAGNOSIS — N183 Chronic kidney disease, stage 3 unspecified: Secondary | ICD-10-CM

## 2017-05-11 LAB — BASIC METABOLIC PANEL
ANION GAP: 14 (ref 5–15)
BUN: 84 mg/dL — AB (ref 6–20)
CHLORIDE: 89 mmol/L — AB (ref 101–111)
CO2: 25 mmol/L (ref 22–32)
CREATININE: 2.72 mg/dL — AB (ref 0.61–1.24)
Calcium: 8.5 mg/dL — ABNORMAL LOW (ref 8.9–10.3)
GFR calc non Af Amer: 22 mL/min — ABNORMAL LOW (ref >60)
GFR, EST AFRICAN AMERICAN: 26 mL/min — AB (ref >60)
GLUCOSE: 136 mg/dL — AB (ref 65–99)
Potassium: 3.5 mmol/L (ref 3.5–5.1)
SODIUM: 128 mmol/L — AB (ref 135–145)

## 2017-05-11 LAB — CBC
HCT: 33.1 % — ABNORMAL LOW (ref 39.0–52.0)
Hemoglobin: 10.4 g/dL — ABNORMAL LOW (ref 13.0–17.0)
MCH: 25.8 pg — AB (ref 26.0–34.0)
MCHC: 31.4 g/dL (ref 30.0–36.0)
MCV: 82.1 fL (ref 78.0–100.0)
PLATELETS: 124 10*3/uL — AB (ref 150–400)
RBC: 4.03 MIL/uL — AB (ref 4.22–5.81)
RDW: 18.3 % — AB (ref 11.5–15.5)
WBC: 6.9 10*3/uL (ref 4.0–10.5)

## 2017-05-11 NOTE — Progress Notes (Signed)
Patient ID: [name], male   DOB: Mar 02, 1946, 71 y.o.   MRN: [id number]    Advanced Heart Failure Clinic Note   GI: [name]- followed for cirrhosis Pulmonary: [name]  PCP: Marjean Donna [facility])  Cardiology: [name]  [name] is a 71 yo with history of CAD s/p CABG, permanent atrial fibrillation, cirrhosis (NASH and likely component of CHF-related cirrhosis) chronic diastolic CHF, severe AS s/p bioprosthetic AVR who presents for evaluation of diastolic CHF.  Last LHC in 8/13 showed patent grafts.  AVR was in 10/13.  Last echo in 1/18 showed EF 60-65%, normal bioprosthetic aortic valve.   July 2015:  Admitted to the hospital for GI bleed requiring 2 PRBCs. No additional GI work-up with recent EGD showing gastritis.  He had an AV nodal ablation in 9/15 with significant improvement after this.  He has St Jude PPM.   In 6/17, had right knee operation.    Pt presents today for regular follow up.  He had US paracentesis yesterday with 4.8 L removed. (Also had paracentesis 04/26/17 with 5.1 L removed). He remains fatigued and SOB walking short distances. Occasionally SOB with ADLs, but usually OK for a few days after a paracentesis. Weight down 10 lbs from last visit (consistent with paracentesis). He follows [name] for nephrology. Has been told he would be poor dialysis candidate.   He says he drinks a lot, but "not too much.". His wife then tells me for dinner last night he had 32 oz of Mt Dew, and then drank 4-5 cans of Selzter water (12oz) before bed. This being similar to what he drinks at other meals. Denies chest pain. Very inactive. Occasional lightheadedness with rapid standing.   Labs 8/16: K 3.8, creatinine 2.42 Labs 04/07/2015: K 4.3, Creatinine 2.22, BNP 169 Labs (11/16): K 4, creatinine 1.78 => 1.55, BNP 120 Labs 11/11/2015 K 3.7 Creatinine 1.53  Labs (6/17): K 3.8, creatinine 1.52 Labs (12/17): K 3.5, creatinine 1.95, LDL 32 Labs (1/18): K 4.2, creatinine  2.51 Labs (4/18): K 3.6, creatinine 2.5, hgb 10.2  PMH: 1. CAD: s/p CABG 2004. Last LHC (8/13) with patent free radial-OM, patent SVG-D, patent LIMA-LAD.  2. Permanent atrial fibrillation: Not anticoagulated due to GI bleeding.  Holter monitor (4/15) with mean HR 115 (afib).   He had AV nodal ablation with St Jude dual chamber PPM  3. Chronic diastolic CHF: Echo (6/01) with EF 55-60%, bioprosthetic aortic valve with mean gradient 22 mmHg, mildly decreased RV systolic function. RHC (12/2013): RA 14, RV 44/4/11, PA 50/23 (33), PCWP 20, v = 35, Fick CO/CI: 6.0/2.6, PVR 2.2 WU, PA 59%.  Echo (8/15) with EF 55-60%, mild LVH, bioprosthetic aortic valve with mean gradient 16 mmHg, PA systolic pressure 46 mmHg, mild to moderate Matthew.  Echo (8/16) with EF 55-60%, normal bioprosthetic aortic valve, mild Matthew, PASP 44 mmHg.  - Gynecomastia with spironolactone.  - Echo (1/18): EF 60-65%, grade II diastolic dysfunction, bioprosthetic aortic valve appeared normal, PA systolic pressure 59 mmHg.  4. Severe aortic stenosis s/p bioprosthetic AVR in 10/13. Mean gradient 22 mmHg across valve on echo in 8/14.  Mean gradient 16 mmHg on echo in 8/15. Mean gradient 15 mmHg by echo in 1/18.  5. NASH with cirrhosis: ascites, thrombocytopenia.  CT abdomen in 6/17 showed cirrhosis and moderate ascites. He has regular paracenteses.  6. 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  EGD in 1/15 showed mild portal gastropathy and 2 small antral ulcers. Recurrent  GI bleed in 6/15, EGD showed gastritis and ?GAVE.  - EGD (4/18): grade I varices, gastric polyps, GAVE.   - Colonoscopy (4/18): No bleeding source.  7. Hyperlipidemia 8. HTN 9. GERD 10. COPD 28. OSA: Cannot tolerate CPAP.  12. 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  14. Carotid dopplers (11/15) with minimal stenosis.  15. ABI 12/11/2014: normal    SH: Lives with wife in [place], prior smoker.   FH: CAD  Review of systems complete and found to be negative unless listed in HPI.     Current Outpatient Prescriptions on File Prior to Encounter  Medication Sig Dispense Refill  . acetaminophen (TYLENOL) 500 MG tablet Take 1,000 mg by mouth at bedtime as needed for mild pain or headache.     . allopurinol (ZYLOPRIM) 100 MG tablet Take 1 tablet (100 mg total) by mouth daily. 90 tablet 3  . atorvastatin (LIPITOR) 20 MG tablet Take 20 mg by mouth daily at 6 PM.     . colchicine 0.6 MG tablet Take 0.6 mg by mouth daily.     . ferrous gluconate (FERGON) 324 MG tablet Take 324 mg by mouth 3 (three) times daily with meals.     . gabapentin (NEURONTIN) 300 MG capsule Take 300 mg by mouth 3 (three) times daily.  6  . insulin NPH Human (HUMULIN N,NOVOLIN N) 100 UNIT/ML injection Inject 65 Units into the skin 2 (two) times daily before a meal.     . insulin regular (NOVOLIN R,HUMULIN R) 100 units/mL injection Inject 30 Units into the skin 2 (two) times daily before a meal.     . methocarbamol (ROBAXIN) 500 MG tablet Take 1 tablet (500 mg total) by mouth 4 (four) times daily. As needed for muscle spasm 30 tablet 1  . metolazone (ZAROXOLYN) 5 MG tablet Take 1 tablet (5 mg total) by mouth every other day. 15 tablet 3  . metoprolol succinate (TOPROL-XL) 100 MG 24 hr tablet TAKE 1 TABLET (100 MG TOTAL) BY MOUTH 2 (TWO) TIMES DAILY. 60 tablet 6  . Multiple Vitamins-Minerals (CVS SPECTRAVITE ADULT 50+ PO) Take 1 tablet by mouth daily.    [name] omeprazole (PRILOSEC) 20 MG capsule Take 1 capsule (20 mg total) by mouth 2 (two) times daily before a meal. 30 capsule 5  . spironolactone (ALDACTONE) 50 MG tablet Take 1 tablet (50 mg total) by mouth 2 (two) times daily. 180 tablet 3  . tamsulosin (FLOMAX) 0.4 MG CAPS capsule TAKE ONE CAPSULE BY MOUTH EVERY DAY AFTER SUPPER 30 capsule 3  . torsemide (DEMADEX) 20 MG tablet Take 6 tablets (120 mg total) by mouth 2 (two) times daily. 360 tablet 3  . vitamin C (ASCORBIC ACID) 500 MG tablet Take 500 mg by mouth 2 (two) times daily.    . benzonatate (TESSALON)  100 MG capsule Take 100 mg by mouth 3 (three) times daily as needed for cough.     No current facility-administered medications on file prior to encounter.     Vitals:   05/11/17 0854  BP: (!) 90/53  Pulse: 81  SpO2: 98%  Weight: 282 lb (127.9 kg)     Wt Readings from Last 3 Encounters:  05/11/17 282 lb (127.9 kg)  04/20/17 288 lb 4.8 oz (130.8 kg)  04/11/17 280 lb 4.8 oz (127.1 kg)    Physical Exam General: Fatigued appearing. No resp difficulty. HEENT: Normal Neck: Supple. JVP 7-8 cm. Carotids 2+ bilat; no bruits. No thyromegaly or nodule noted. Cor: PMI nondisplaced. RRR, 2/6  early SEM RUSB.  Lungs: CTAB, normal effort. Abdomen: Soft, non-tender, non-distended, no HSM. No bruits or masses. +BS  Extremities: No cyanosis, clubbing, or rash. Trace to 1+ ankle edema.   Neuro: Alert & orientedx3, cranial nerves grossly intact. moves all 4 extremities w/o difficulty. Affect pleasant   Assessment/Plan: 1. Chronic diastolic CHF:  EF 41-32% (1/18).  NYHA class IIIb symptoms, worse recently.  He has ascites due to cardiac cirrhosis/NASH.  - His volume status is OK today in the setting or recent paracentesis.  - Continue torsemide 120 mg BID.  - Continue metolazone 5 mg every other day. BMET today.  - Continue spironolactone 50 mg bid.   2. Chronic atrial fibrillation:  CHADSVASC 4 - age > 73, CHF, HTN, DM2. 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 s/p AV node ablation with dual chamber pacing given very difficult rate control. Hemoglobin has been fairly stable recently.  - Intolerant of ASA and coumadin even for short period, so no Watchman pursued. - Recent EGD showed GAVE.  - No change.  3. CKD stage III: -  BMET today - Keep follow up with [name].  4. Bioprosthetic AVR: Stable on 1/18 echo.  No change.  5. CAD: s/p CABG.   - No s/s of ischemia. - Continue atorvastatin 20 mg daily and Toprol XL 100 mg BID.  - Off ASA  and anticoag with history of GI bleed.  6. Cirrhosis: May be a combination of cardiac cirrhosis and NASH-related. Follows with Matthew. Laural [name].  - Ascites stable today s/p paracentesis - Continue paracentesis as needed (Standing order placed at last visit)  Prognosis guarded. In previous discussions pt has stated he drinks up to 200 + oz, and according to his wife he continues to do this (see HPI). Had frank discussion about his fluid intake and prognosis.  No room to change medicines at this time with soft pressures.  Labs today. Continue as needed paracentesis. RTC 2 months. Knows to call sooner with worsening symptoms.   Shirley Friar, PA-C  05/11/2017

## 2017-05-11 NOTE — Progress Notes (Signed)
Advanced Heart Failure Medication Review by a Pharmacist  Does the patient  feel that his/her medications are working for him/her?  yes  Has the patient been experiencing any side effects to the medications prescribed?  no  Does the patient measure his/her own blood pressure or blood glucose at home?  yes   Does the patient have any problems obtaining medications due to transportation or finances?   no  Understanding of regimen: good Understanding of indications: good Potential of compliance: good Patient understands to avoid NSAIDs. Patient understands to avoid decongestants.  Issues to address at subsequent visits: None   Pharmacist comments: [name] is a pleasant 71 yo M presenting with his wife and without a medication list. He reports good compliance with his regimen but did admit to not taking any additional KCl with his metolazone because he was not aware that he should be doing this. No other medication-related questions or concerns for me at this time.   [name], PharmD, BCPS, CPP Clinical Pharmacist Pager: [phone] Phone: [phone] 05/11/2017 9:04 AM      Time with patient: 10 minutes Preparation and documentation time: 2 minutes Total time: 12 minutes

## 2017-05-11 NOTE — Patient Instructions (Signed)
Routine lab work today. Will notify you of abnormal results  Follow up with Dr.McLean in 2 months 

## 2017-05-12 DIAGNOSIS — K7460 Unspecified cirrhosis of liver: Secondary | ICD-10-CM | POA: Diagnosis not present

## 2017-05-12 DIAGNOSIS — I5032 Chronic diastolic (congestive) heart failure: Secondary | ICD-10-CM | POA: Diagnosis not present

## 2017-05-12 DIAGNOSIS — N189 Chronic kidney disease, unspecified: Secondary | ICD-10-CM | POA: Diagnosis not present

## 2017-05-12 DIAGNOSIS — I2510 Atherosclerotic heart disease of native coronary artery without angina pectoris: Secondary | ICD-10-CM | POA: Diagnosis not present

## 2017-05-12 DIAGNOSIS — I130 Hypertensive heart and chronic kidney disease with heart failure and stage 1 through stage 4 chronic kidney disease, or unspecified chronic kidney disease: Secondary | ICD-10-CM | POA: Diagnosis not present

## 2017-05-12 DIAGNOSIS — E1122 Type 2 diabetes mellitus with diabetic chronic kidney disease: Secondary | ICD-10-CM | POA: Diagnosis not present

## 2017-05-17 ENCOUNTER — Other Ambulatory Visit (HOSPITAL_COMMUNITY): Payer: Self-pay | Admitting: *Deleted

## 2017-05-17 MED ORDER — METOPROLOL SUCCINATE ER 100 MG PO TB24: 100.0000 mg | ORAL_TABLET | Freq: Two times a day (BID) | ORAL | 6 refills | Status: DC

## 2017-05-24 ENCOUNTER — Ambulatory Visit (HOSPITAL_COMMUNITY)
Admission: RE | Admit: 2017-05-24 | Discharge: 2017-05-24 | Disposition: A | Discharge status: Home / Self Care | Payer: Medicare Other | Source: Ambulatory Visit | Attending: Cardiology | Admitting: Cardiology

## 2017-05-24 ENCOUNTER — Encounter (HOSPITAL_COMMUNITY): Payer: Self-pay

## 2017-05-24 DIAGNOSIS — R188 Other ascites: Secondary | ICD-10-CM | POA: Insufficient documentation

## 2017-05-24 NOTE — Progress Notes (Signed)
Paracentesis complete no signs of distress, 4.1L yellow colored acites removed.

## 2017-05-24 NOTE — Procedures (Signed)
   US guided RLQ paracentesis  4.1 L dark yellow fluid   Tolerated well

## 2017-05-25 DIAGNOSIS — Z6841 Body Mass Index (BMI) 40.0 and over, adult: Secondary | ICD-10-CM | POA: Diagnosis not present

## 2017-05-25 DIAGNOSIS — Z23 Encounter for immunization: Secondary | ICD-10-CM | POA: Diagnosis not present

## 2017-05-25 DIAGNOSIS — K7460 Unspecified cirrhosis of liver: Secondary | ICD-10-CM | POA: Diagnosis not present

## 2017-05-25 DIAGNOSIS — N183 Chronic kidney disease, stage 3 (moderate): Secondary | ICD-10-CM | POA: Diagnosis not present

## 2017-05-25 DIAGNOSIS — I5030 Unspecified diastolic (congestive) heart failure: Secondary | ICD-10-CM | POA: Diagnosis not present

## 2017-06-01 DIAGNOSIS — L989 Disorder of the skin and subcutaneous tissue, unspecified: Secondary | ICD-10-CM | POA: Diagnosis not present

## 2017-06-01 DIAGNOSIS — R05 Cough: Secondary | ICD-10-CM | POA: Diagnosis not present

## 2017-06-01 DIAGNOSIS — J22 Unspecified acute lower respiratory infection: Secondary | ICD-10-CM | POA: Diagnosis not present

## 2017-06-29 ENCOUNTER — Other Ambulatory Visit (HOSPITAL_COMMUNITY): Payer: Self-pay | Admitting: *Deleted

## 2017-06-29 MED ORDER — TORSEMIDE 20 MG PO TABS
120.0000 mg | ORAL_TABLET | Freq: Two times a day (BID) | ORAL | 3 refills | Status: DC
Start: 2017-06-29 — End: 2017-11-12

## 2017-06-29 MED ORDER — METOLAZONE 5 MG PO TABS: 5.0000 mg | ORAL_TABLET | ORAL | 3 refills | Status: DC

## 2017-06-29 MED ORDER — TAMSULOSIN HCL 0.4 MG PO CAPS: ORAL_CAPSULE | ORAL | 3 refills | Status: DC

## 2017-06-30 ENCOUNTER — Other Ambulatory Visit (HOSPITAL_COMMUNITY): Payer: Self-pay | Admitting: *Deleted

## 2017-06-30 MED ORDER — TAMSULOSIN HCL 0.4 MG PO CAPS: ORAL_CAPSULE | ORAL | 3 refills | Status: DC

## 2017-07-03 ENCOUNTER — Ambulatory Visit (INDEPENDENT_AMBULATORY_CARE_PROVIDER_SITE_OTHER): Payer: Medicare Other | Admitting: *Deleted

## 2017-07-03 DIAGNOSIS — I442 Atrioventricular block, complete: Secondary | ICD-10-CM | POA: Diagnosis not present

## 2017-07-04 NOTE — Progress Notes (Signed)
Remote pacemaker transmission.   

## 2017-07-07 ENCOUNTER — Encounter: Payer: Self-pay | Admitting: Cardiology

## 2017-07-11 ENCOUNTER — Ambulatory Visit (HOSPITAL_COMMUNITY)
Admission: RE | Admit: 2017-07-11 | Discharge: 2017-07-11 | Disposition: A | Discharge status: Home / Self Care | Payer: Medicare Other | Source: Ambulatory Visit | Attending: Cardiology | Admitting: Cardiology

## 2017-07-11 VITALS — BP 124/74 | HR 74 | Wt 274.2 lb

## 2017-07-11 DIAGNOSIS — E1122 Type 2 diabetes mellitus with diabetic chronic kidney disease: Secondary | ICD-10-CM | POA: Diagnosis not present

## 2017-07-11 DIAGNOSIS — Z794 Long term (current) use of insulin: Secondary | ICD-10-CM | POA: Diagnosis not present

## 2017-07-11 DIAGNOSIS — Z79899 Other long term (current) drug therapy: Secondary | ICD-10-CM | POA: Diagnosis not present

## 2017-07-11 DIAGNOSIS — I482 Chronic atrial fibrillation: Secondary | ICD-10-CM

## 2017-07-11 DIAGNOSIS — Z951 Presence of aortocoronary bypass graft: Secondary | ICD-10-CM | POA: Diagnosis not present

## 2017-07-11 DIAGNOSIS — Z8249 Family history of ischemic heart disease and other diseases of the circulatory system: Secondary | ICD-10-CM | POA: Diagnosis not present

## 2017-07-11 DIAGNOSIS — N183 Chronic kidney disease, stage 3 unspecified: Secondary | ICD-10-CM

## 2017-07-11 DIAGNOSIS — Z953 Presence of xenogenic heart valve: Secondary | ICD-10-CM | POA: Insufficient documentation

## 2017-07-11 DIAGNOSIS — J449 Chronic obstructive pulmonary disease, unspecified: Secondary | ICD-10-CM | POA: Insufficient documentation

## 2017-07-11 DIAGNOSIS — M109 Gout, unspecified: Secondary | ICD-10-CM | POA: Insufficient documentation

## 2017-07-11 DIAGNOSIS — F329 Major depressive disorder, single episode, unspecified: Secondary | ICD-10-CM | POA: Diagnosis not present

## 2017-07-11 DIAGNOSIS — I2510 Atherosclerotic heart disease of native coronary artery without angina pectoris: Secondary | ICD-10-CM | POA: Insufficient documentation

## 2017-07-11 DIAGNOSIS — I5032 Chronic diastolic (congestive) heart failure: Secondary | ICD-10-CM | POA: Insufficient documentation

## 2017-07-11 DIAGNOSIS — E785 Hyperlipidemia, unspecified: Secondary | ICD-10-CM | POA: Diagnosis not present

## 2017-07-11 DIAGNOSIS — K219 Gastro-esophageal reflux disease without esophagitis: Secondary | ICD-10-CM | POA: Insufficient documentation

## 2017-07-11 DIAGNOSIS — I130 Hypertensive heart and chronic kidney disease with heart failure and stage 1 through stage 4 chronic kidney disease, or unspecified chronic kidney disease: Secondary | ICD-10-CM | POA: Insufficient documentation

## 2017-07-11 DIAGNOSIS — I350 Nonrheumatic aortic (valve) stenosis: Secondary | ICD-10-CM

## 2017-07-11 DIAGNOSIS — I4821 Permanent atrial fibrillation: Secondary | ICD-10-CM

## 2017-07-11 DIAGNOSIS — G4733 Obstructive sleep apnea (adult) (pediatric): Secondary | ICD-10-CM | POA: Diagnosis not present

## 2017-07-11 DIAGNOSIS — K7460 Unspecified cirrhosis of liver: Secondary | ICD-10-CM | POA: Diagnosis not present

## 2017-07-11 DIAGNOSIS — R188 Other ascites: Secondary | ICD-10-CM | POA: Diagnosis not present

## 2017-07-11 LAB — BASIC METABOLIC PANEL
Anion gap: 16 — ABNORMAL HIGH (ref 5–15)
BUN: 109 mg/dL — ABNORMAL HIGH (ref 6–20)
CALCIUM: 8.8 mg/dL — AB (ref 8.9–10.3)
CO2: 22 mmol/L (ref 22–32)
CREATININE: 2.93 mg/dL — AB (ref 0.61–1.24)
Chloride: 85 mmol/L — ABNORMAL LOW (ref 101–111)
GFR, EST AFRICAN AMERICAN: 24 mL/min — AB (ref >60)
GFR, EST NON AFRICAN AMERICAN: 20 mL/min — AB (ref >60)
Glucose, Bld: 155 mg/dL — ABNORMAL HIGH (ref 65–99)
Potassium: 3.2 mmol/L — ABNORMAL LOW (ref 3.5–5.1)
SODIUM: 123 mmol/L — AB (ref 135–145)

## 2017-07-11 LAB — LIPID PANEL
CHOLESTEROL: 84 mg/dL (ref 0–200)
HDL: 30 mg/dL — ABNORMAL LOW (ref >40)
LDL CALC: 21 mg/dL (ref 0–99)
TRIGLYCERIDES: 165 mg/dL — AB (ref <150)
Total CHOL/HDL Ratio: 2.8 RATIO
VLDL: 33 mg/dL (ref 0–40)

## 2017-07-11 MED ORDER — SERTRALINE HCL 50 MG PO TABS: 50.0000 mg | ORAL_TABLET | Freq: Every day | ORAL | 1 refills | Status: DC

## 2017-07-11 MED ORDER — TAMSULOSIN HCL 0.4 MG PO CAPS: ORAL_CAPSULE | ORAL | 3 refills | Status: AC

## 2017-07-11 NOTE — Progress Notes (Signed)
Patient ID: [name], male   DOB: Apr 07, 1946, 71 y.o.   MRN: [id number]    Advanced Heart Failure Clinic Note   GI: Dr Laural Golden- followed for cirrhosis Pulmonary: Dr Melvyn Novas  PCP: Marjean Donna [facility])  Cardiology: Dr. Aundra Dubin  [name] is a 71 y.o. 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  Last LHC in 8/13 showed patent grafts.  AVR was in 10/13.  Last echo in 1/18 showed EF 60-65%, normal bioprosthetic aortic valve.   July 2015:  Admitted to the hospital for GI bleed requiring 2 PRBCs. No additional GI work-up with recent EGD showing gastritis.  He had an AV nodal ablation in 9/15 with significant improvement after this.  He has St Jude PPM.   In 6/17, had right knee operation.    [name] presents today for followup of chronic diastolic CHF.  He has historically had frequent therapeutic paracenteses, but has finally started cutting back on his excessive po fluid intake. He has not had a paracentesis in 6 weeks (had been getting them every 3 wks). I had set him up for home PT, which he has now completed.  However, his wife says that he has not been doing any of the exercises they recommended.  He is very sedentary.  He has lost interest in most activities. He reports significant depression.  He gets short of breath walking across the house, this is unchanged. He fatigues easily.  He is not using CPAP. Weight is down 8 lbs since last appt.   Labs (8/14) LDL 38 Labs (3/15) K 3.4, creatinine 1.7, BUN 55  Labs (10/15/13) K 4.0 Creatinine 1.6 BUN 32 Labs (10/29/13) K 4.0 Creatinine 1.6  Labs (4/15) K 4.2 Creatinine 1.32 => 1.5, HCT 35.2 Labs (12/11/13) K 3.6 Creatinine 1.56 Pro BNP 1152 Hemoglobin 9.6  Labs (01/12/14) K 3.0, creatinine 2.0, hemoglobin 8.8 Labs (01/31/14) K 3.3, creatinine 1.49 Labs  (8/15) K 3.9, creatinine 1.89 Labs (9/15): K 3.7, creatinine 1.6, AST 48, ALT 52, TSH normal Labs (10/15): K 3.5, creatinine 1.58, BNP 780 Labs (3/16): K 3.5, creatinine 1.6 Labs 12/03/2014: K 4.4 Creatinine 1.55 Labs 12/12/2014: K 3.7 Creatinine 1.48, LDL 50 Labs 8/16: K 3.8, creatinine 2.42 Labs 04/07/2015: K 4.3, Creatinine 2.22, BNP 169 Labs (11/16): K 4, creatinine 1.78 => 1.55, BNP 120 Labs 11/11/2015 K 3.7 Creatinine 1.53  Labs (6/17): K 3.8, creatinine 1.52 Labs (12/17): K 3.5, creatinine 1.95, LDL 32 Labs (1/18): K 4.2, creatinine 2.51 Labs (4/18): K 3.6, creatinine 2.5, hgb 10.2 Labs (10/18): K 3.5, creatinine 2.72, hgb 10.4  PMH: 1. CAD: s/p CABG 2004. Last LHC (8/13) with patent free radial-OM, patent SVG-D, patent LIMA-LAD.  2. Permanent atrial fibrillation: Not anticoagulated due to GI bleeding.  Holter monitor (4/15) with mean HR 115 (afib).   He had AV nodal ablation with St Jude dual chamber PPM  3. Chronic diastolic CHF: Echo (9/37) with EF 55-60%, bioprosthetic aortic valve with mean gradient 22 mmHg, mildly decreased RV systolic function. RHC (12/2013): RA 14, RV 44/4/11, PA 50/23 (33), PCWP 20, v = 35, Fick CO/CI: 6.0/2.6, PVR 2.2 WU, PA 59%.  Echo (8/15) with EF 55-60%, mild LVH, bioprosthetic aortic valve with mean gradient 16 mmHg, PA systolic pressure 46 mmHg, mild to moderate Matthew.  Echo (8/16) with EF 55-60%, normal bioprosthetic aortic valve,  mild Matthew, PASP 44 mmHg.  - Gynecomastia with spironolactone.  - Echo (1/18): EF 60-65%, grade II diastolic dysfunction, bioprosthetic aortic valve appeared normal, PA systolic pressure 59 mmHg.  4. Severe aortic stenosis s/p bioprosthetic AVR in 10/13. Mean gradient 22 mmHg across valve on echo in 8/14.  Mean gradient 16 mmHg on echo in 8/15. Mean gradient 15 mmHg by echo in 1/18.  5. NASH with cirrhosis: ascites, thrombocytopenia.  CT abdomen in 6/17 showed cirrhosis and moderate ascites. He has regular paracenteses.  6. 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  EGD in 1/15 showed mild portal gastropathy and 2 small antral ulcers. Recurrent GI bleed in 6/15, EGD showed gastritis and ?GAVE.  - EGD (4/18): grade I varices, gastric polyps, GAVE.   - Colonoscopy (4/18): No bleeding source.  7. Hyperlipidemia 8. HTN 9. GERD 10. COPD 15. OSA: Cannot tolerate CPAP.  12. 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  14. Carotid dopplers (11/15) with minimal stenosis.  15. ABIs 12/11/2014: normal   16. Depression  SH: Lives with wife in [place], prior smoker.   FH: CAD  ROS: All systems reviewed and negative except as per HPI.   Current Outpatient Medications on File Prior to Encounter  Medication Sig Dispense Refill  . acetaminophen (TYLENOL) 500 MG tablet Take 1,000 mg by mouth at bedtime as needed for mild pain or headache.     . allopurinol (ZYLOPRIM) 100 MG tablet Take 1 tablet (100 mg total) by mouth daily. 90 tablet 3  . atorvastatin (LIPITOR) 20 MG tablet Take 20 mg by mouth daily at 6 PM.     . colchicine 0.6 MG tablet Take 0.6 mg by mouth daily.     . ferrous gluconate (FERGON) 324 MG tablet Take 324 mg by mouth 3 (three) times daily with meals.     . gabapentin (NEURONTIN) 300 MG capsule Take 300 mg by mouth 3 (three) times daily.  6  . insulin NPH Human (HUMULIN N,NOVOLIN N) 100 UNIT/ML injection Inject 65 Units into the skin 2 (two) times daily before a meal.     . insulin regular (NOVOLIN R,HUMULIN R) 100 units/mL injection Inject 30 Units into the skin 2 (two) times daily before a meal.     . methocarbamol (ROBAXIN) 500 MG tablet Take 1 tablet (500 mg total) by mouth 4 (four) times daily. As needed for muscle spasm 30 tablet 1  . metolazone (ZAROXOLYN) 5 MG tablet Take 1 tablet (5 mg total) by mouth every other day. 15 tablet 3  . metoprolol succinate (TOPROL-XL) 100 MG 24 hr tablet Take 1 tablet (100 mg total) by mouth 2 (two) times daily. 180 tablet 6  . Multiple Vitamins-Minerals ([employer] SPECTRAVITE ADULT 50+ PO) Take 1  tablet by mouth daily.    [name] omeprazole (PRILOSEC) 20 MG capsule Take 1 capsule (20 mg total) by mouth 2 (two) times daily before a meal. 30 capsule 5  . potassium chloride SA (K-DUR,KLOR-CON) 20 MEQ tablet Take 20 mEq by mouth 2 (two) times daily.    [name] PROAIR HFA 108 (90 Base) MCG/ACT inhaler Take 2 puffs by mouth 2 (two) times daily.    [name] spironolactone (ALDACTONE) 50 MG tablet Take 1 tablet (50 mg total) by mouth 2 (two) times daily. 180 tablet 3  . torsemide (DEMADEX) 20 MG tablet Take 6 tablets (120 mg total) by mouth 2 (two) times daily. 360 tablet 3  . vitamin C (ASCORBIC ACID) 500 MG tablet Take 500 mg by mouth 2 (  two) times daily.     No current facility-administered medications on file prior to encounter.     Vitals:   07/11/17 0956  BP: 124/74  Pulse: 74  SpO2: 95%  Weight: 274 lb 4 oz (124.4 kg)     Wt Readings from Last 3 Encounters:  07/11/17 274 lb 4 oz (124.4 kg)  05/11/17 282 lb (127.9 kg)  04/20/17 288 lb 4.8 oz (130.8 kg)    General: NAD Neck: No JVD, no thyromegaly or thyroid nodule.  Lungs: Slight crackles at bases. CV: Nondisplaced PMI.  Heart regular S1/S2, no S3/S4, 2/6 SEM RUSB.  No peripheral edema.  No carotid bruit.  Normal pedal pulses.  Abdomen: Soft, nontender, no hepatosplenomegaly, mild distention.  Skin: Intact without lesions or rashes.  Neurologic: Alert and oriented x 3.  Psych: Normal affect. Extremities: No clubbing or cyanosis.  HEENT: Normal.   Assessment/Plan: 1. Chronic diastolic CHF:  EF 09-81% (1/18).  NYHA class IIIb symptoms, stable.  He has had trouble with ascites due to cardiac cirrhosis/NASH.  However, he does not look significantly volume overloaded today and has not had a paracentesis in 6 wks. He has cut back considerably on his fluid intake.  - He does not need a paracentesis currently. He has a standing order in case he needs in the future.   - Continue torsemide 120 mg bid.    - Continue metolazone 5 mg every other day.     - Continue spironolactone 50 mg bid.   - BMET.   2. Chronic atrial fibrillation:  CHADSVASC 4 - age > 27, CHF, HTN, DM2. 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 s/p AV node ablation with dual chamber pacing given very difficult rate control. Hemoglobin has been fairly stable recently.  - Intolerant of ASA and coumadin even for short period, so no Watchman pursued. 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  3. CKD stage III: BMET today.  He follows with Dr. Joelyn Oms.  4. Bioprosthetic AVR: Stable on 1/18 echo.    5. CAD: s/p CABG.  No chest pain.   - Continue atorvastatin 20 mg daily and Toprol XL 100 mg BID. Check lipids today. - Off ASA and anticoag with history of GI bleed.  6. Cirrhosis: May be a combination of cardiac cirrhosis and NASH-related. Follows with Dr. Laural Golden.  - Ascites better-controlled recently.  7. Depression: Depressed mood, anhedonia.  I think that this plays a significant role in his symptomatology.  - Start sertraline 50 mg daily.  8. OSA: Restart CPAP (he has at home, he is willing to restart).   Followup in 1 month with NP/PA.    [name] 07/11/2017

## 2017-07-11 NOTE — Patient Instructions (Signed)
Start Sertraline 50 mg daily, please get further refills from your Primary Care MD  Labs today  Please use your CPAP  You have been referred to [place] for Physical Therapy  Your physician recommends that you schedule a follow-up appointment in: 1 month

## 2017-07-13 ENCOUNTER — Encounter (HOSPITAL_COMMUNITY): Payer: Self-pay

## 2017-07-13 ENCOUNTER — Telehealth (HOSPITAL_COMMUNITY): Payer: Self-pay

## 2017-07-13 DIAGNOSIS — I2510 Atherosclerotic heart disease of native coronary artery without angina pectoris: Secondary | ICD-10-CM | POA: Diagnosis not present

## 2017-07-13 DIAGNOSIS — E7800 Pure hypercholesterolemia, unspecified: Secondary | ICD-10-CM | POA: Diagnosis not present

## 2017-07-13 DIAGNOSIS — I10 Essential (primary) hypertension: Secondary | ICD-10-CM | POA: Diagnosis not present

## 2017-07-13 DIAGNOSIS — M109 Gout, unspecified: Secondary | ICD-10-CM | POA: Diagnosis not present

## 2017-07-13 DIAGNOSIS — F329 Major depressive disorder, single episode, unspecified: Secondary | ICD-10-CM | POA: Diagnosis not present

## 2017-07-13 DIAGNOSIS — I5032 Chronic diastolic (congestive) heart failure: Secondary | ICD-10-CM | POA: Diagnosis not present

## 2017-07-13 DIAGNOSIS — E1122 Type 2 diabetes mellitus with diabetic chronic kidney disease: Secondary | ICD-10-CM | POA: Diagnosis not present

## 2017-07-13 DIAGNOSIS — I482 Chronic atrial fibrillation: Secondary | ICD-10-CM | POA: Diagnosis not present

## 2017-07-13 DIAGNOSIS — J441 Chronic obstructive pulmonary disease with (acute) exacerbation: Secondary | ICD-10-CM | POA: Diagnosis not present

## 2017-07-13 MED ORDER — METOLAZONE 5 MG PO TABS: 5.0000 mg | ORAL_TABLET | ORAL | 3 refills | Status: DC

## 2017-07-13 MED ORDER — POTASSIUM CHLORIDE CRYS ER 20 MEQ PO TBCR: 40.0000 meq | EXTENDED_RELEASE_TABLET | Freq: Two times a day (BID) | ORAL | 3 refills | Status: DC

## 2017-07-13 NOTE — Telephone Encounter (Signed)
Pt aware of results, agreeable to med changes (orders place, changes made in [facility]) and diet restrictions, RX written to get labs during another lab appt 07/24/2017 (left up front for pt.), and Lipid Letter mailed to patient.   ------  Notes recorded by Larey Dresser, MD on 07/12/2017 at 12:11 PM EST Decrease metolazone to every 3rd day. With low sodium, fluid restrict to < 1800 cc/day. Increase KCl to 40 bid. BMET 1 week.

## 2017-07-14 LAB — CUP PACEART REMOTE DEVICE CHECK
Battery Remaining Longevity: 125 mo
Battery Remaining Percentage: 95.5 %
Battery Voltage: 3.01 V
Brady Statistic RV Percent Paced: 99 %
Date Time Interrogation Session: 20181211054256
Implantable Lead Location: 753860
Implantable Lead Model: 1948
Implantable Pulse Generator Implant Date: 20150831
Lead Channel Pacing Threshold Pulse Width: 0.4 ms
Lead Channel Setting Pacing Amplitude: 2.5 V
Lead Channel Setting Pacing Pulse Width: 0.4 ms
MDC IDC LEAD IMPLANT DT: 20150831
MDC IDC MSMT LEADCHNL RV IMPEDANCE VALUE: 710 Ohm
MDC IDC MSMT LEADCHNL RV PACING THRESHOLD AMPLITUDE: 0.5 V
MDC IDC MSMT LEADCHNL RV SENSING INTR AMPL: 12 mV
MDC IDC PG SERIAL: 3014543
MDC IDC SET LEADCHNL RV SENSING SENSITIVITY: 6 mV

## 2017-07-20 DIAGNOSIS — Z794 Long term (current) use of insulin: Secondary | ICD-10-CM | POA: Diagnosis not present

## 2017-07-20 DIAGNOSIS — D631 Anemia in chronic kidney disease: Secondary | ICD-10-CM | POA: Diagnosis not present

## 2017-07-20 DIAGNOSIS — M1991 Primary osteoarthritis, unspecified site: Secondary | ICD-10-CM | POA: Diagnosis not present

## 2017-07-20 DIAGNOSIS — E1122 Type 2 diabetes mellitus with diabetic chronic kidney disease: Secondary | ICD-10-CM | POA: Diagnosis not present

## 2017-07-20 DIAGNOSIS — G4733 Obstructive sleep apnea (adult) (pediatric): Secondary | ICD-10-CM | POA: Diagnosis not present

## 2017-07-20 DIAGNOSIS — N183 Chronic kidney disease, stage 3 (moderate): Secondary | ICD-10-CM | POA: Diagnosis not present

## 2017-07-20 DIAGNOSIS — I5032 Chronic diastolic (congestive) heart failure: Secondary | ICD-10-CM | POA: Diagnosis not present

## 2017-07-20 DIAGNOSIS — M1000 Idiopathic gout, unspecified site: Secondary | ICD-10-CM | POA: Diagnosis not present

## 2017-07-20 DIAGNOSIS — I130 Hypertensive heart and chronic kidney disease with heart failure and stage 1 through stage 4 chronic kidney disease, or unspecified chronic kidney disease: Secondary | ICD-10-CM | POA: Diagnosis not present

## 2017-07-20 DIAGNOSIS — I482 Chronic atrial fibrillation: Secondary | ICD-10-CM | POA: Diagnosis not present

## 2017-07-20 DIAGNOSIS — J449 Chronic obstructive pulmonary disease, unspecified: Secondary | ICD-10-CM | POA: Diagnosis not present

## 2017-07-20 DIAGNOSIS — F329 Major depressive disorder, single episode, unspecified: Secondary | ICD-10-CM | POA: Diagnosis not present

## 2017-07-20 DIAGNOSIS — I2510 Atherosclerotic heart disease of native coronary artery without angina pectoris: Secondary | ICD-10-CM | POA: Diagnosis not present

## 2017-07-20 DIAGNOSIS — Z950 Presence of cardiac pacemaker: Secondary | ICD-10-CM | POA: Diagnosis not present

## 2017-07-20 DIAGNOSIS — K5520 Angiodysplasia of colon without hemorrhage: Secondary | ICD-10-CM | POA: Diagnosis not present

## 2017-07-20 DIAGNOSIS — Z952 Presence of prosthetic heart valve: Secondary | ICD-10-CM | POA: Diagnosis not present

## 2017-07-20 DIAGNOSIS — K7581 Nonalcoholic steatohepatitis (NASH): Secondary | ICD-10-CM | POA: Diagnosis not present

## 2017-07-20 DIAGNOSIS — Z87891 Personal history of nicotine dependence: Secondary | ICD-10-CM | POA: Diagnosis not present

## 2017-07-20 DIAGNOSIS — Z951 Presence of aortocoronary bypass graft: Secondary | ICD-10-CM | POA: Diagnosis not present

## 2017-07-24 ENCOUNTER — Other Ambulatory Visit (HOSPITAL_BASED_OUTPATIENT_CLINIC_OR_DEPARTMENT_OTHER): Payer: Medicare Other

## 2017-07-24 DIAGNOSIS — D500 Iron deficiency anemia secondary to blood loss (chronic): Secondary | ICD-10-CM

## 2017-07-24 DIAGNOSIS — D649 Anemia, unspecified: Secondary | ICD-10-CM

## 2017-07-24 DIAGNOSIS — I5032 Chronic diastolic (congestive) heart failure: Secondary | ICD-10-CM | POA: Diagnosis not present

## 2017-07-24 DIAGNOSIS — D638 Anemia in other chronic diseases classified elsewhere: Secondary | ICD-10-CM

## 2017-07-24 DIAGNOSIS — E1122 Type 2 diabetes mellitus with diabetic chronic kidney disease: Secondary | ICD-10-CM | POA: Diagnosis not present

## 2017-07-24 DIAGNOSIS — N183 Chronic kidney disease, stage 3 (moderate): Secondary | ICD-10-CM | POA: Diagnosis not present

## 2017-07-24 DIAGNOSIS — I130 Hypertensive heart and chronic kidney disease with heart failure and stage 1 through stage 4 chronic kidney disease, or unspecified chronic kidney disease: Secondary | ICD-10-CM | POA: Diagnosis not present

## 2017-07-24 DIAGNOSIS — I2510 Atherosclerotic heart disease of native coronary artery without angina pectoris: Secondary | ICD-10-CM | POA: Diagnosis not present

## 2017-07-24 DIAGNOSIS — I482 Chronic atrial fibrillation: Secondary | ICD-10-CM | POA: Diagnosis not present

## 2017-07-24 LAB — COMPREHENSIVE METABOLIC PANEL
ALBUMIN: 4.1 g/dL (ref 3.5–5.0)
ALK PHOS: 66 U/L (ref 40–150)
ALT: 23 U/L (ref 0–55)
ANION GAP: 13 meq/L — AB (ref 3–11)
AST: 25 U/L (ref 5–34)
BILIRUBIN TOTAL: 0.66 mg/dL (ref 0.20–1.20)
BUN: 97.4 mg/dL — ABNORMAL HIGH (ref 7.0–26.0)
CO2: 25 mEq/L (ref 22–29)
CREATININE: 2.8 mg/dL — AB (ref 0.7–1.3)
Calcium: 9.8 mg/dL (ref 8.4–10.4)
Chloride: 93 mEq/L — ABNORMAL LOW (ref 98–109)
EGFR: 22 mL/min/{1.73_m2} — AB (ref >60)
GLUCOSE: 86 mg/dL (ref 70–140)
Potassium: 4.6 mEq/L (ref 3.5–5.1)
SODIUM: 131 meq/L — AB (ref 136–145)
TOTAL PROTEIN: 7.6 g/dL (ref 6.4–8.3)

## 2017-07-24 LAB — CBC & DIFF AND RETIC
BASO%: 0.4 % (ref 0.0–2.0)
BASOS ABS: 0 10*3/uL (ref 0.0–0.1)
EOS ABS: 0.1 10*3/uL (ref 0.0–0.5)
EOS%: 1.7 % (ref 0.0–7.0)
HCT: 30.5 % — ABNORMAL LOW (ref 38.4–49.9)
HEMOGLOBIN: 9.2 g/dL — AB (ref 13.0–17.1)
IMMATURE RETIC FRACT: 16.2 % — AB (ref 3.00–10.60)
LYMPH#: 1 10*3/uL (ref 0.9–3.3)
LYMPH%: 12.6 % — AB (ref 14.0–49.0)
MCH: 26.4 pg — ABNORMAL LOW (ref 27.2–33.4)
MCHC: 30.2 g/dL — ABNORMAL LOW (ref 32.0–36.0)
MCV: 87.4 fL (ref 79.3–98.0)
MONO#: 0.7 10*3/uL (ref 0.1–0.9)
MONO%: 9.5 % (ref 0.0–14.0)
NEUT#: 5.8 10*3/uL (ref 1.5–6.5)
NEUT%: 75.8 % — AB (ref 39.0–75.0)
PLATELETS: 138 10*3/uL — AB (ref 140–400)
RBC: 3.49 10*6/uL — ABNORMAL LOW (ref 4.20–5.82)
RDW: 17.7 % — ABNORMAL HIGH (ref 11.0–14.6)
RETIC CT ABS: 147.28 10*3/uL — AB (ref 34.80–93.90)
Retic %: 4.22 % — ABNORMAL HIGH (ref 0.80–1.80)
WBC: 7.7 10*3/uL (ref 4.0–10.3)

## 2017-07-24 LAB — IRON AND TIBC
%SAT: 22 % (ref 20–55)
Iron: 115 ug/dL (ref 42–163)
TIBC: 533 ug/dL — AB (ref 202–409)
UIBC: 418 ug/dL — ABNORMAL HIGH (ref 117–376)

## 2017-07-24 LAB — TECHNOLOGIST REVIEW

## 2017-07-24 LAB — FERRITIN: Ferritin: 53 ng/ml (ref 22–316)

## 2017-07-24 LAB — LACTATE DEHYDROGENASE: LDH: 172 U/L (ref 125–245)

## 2017-07-26 DIAGNOSIS — I5032 Chronic diastolic (congestive) heart failure: Secondary | ICD-10-CM | POA: Diagnosis not present

## 2017-07-26 DIAGNOSIS — E1122 Type 2 diabetes mellitus with diabetic chronic kidney disease: Secondary | ICD-10-CM | POA: Diagnosis not present

## 2017-07-26 DIAGNOSIS — I482 Chronic atrial fibrillation: Secondary | ICD-10-CM | POA: Diagnosis not present

## 2017-07-26 DIAGNOSIS — I2510 Atherosclerotic heart disease of native coronary artery without angina pectoris: Secondary | ICD-10-CM | POA: Diagnosis not present

## 2017-07-26 DIAGNOSIS — I130 Hypertensive heart and chronic kidney disease with heart failure and stage 1 through stage 4 chronic kidney disease, or unspecified chronic kidney disease: Secondary | ICD-10-CM | POA: Diagnosis not present

## 2017-07-26 DIAGNOSIS — N183 Chronic kidney disease, stage 3 (moderate): Secondary | ICD-10-CM | POA: Diagnosis not present

## 2017-07-27 ENCOUNTER — Encounter: Payer: Self-pay | Admitting: Hematology and Oncology

## 2017-07-27 ENCOUNTER — Telehealth: Payer: Self-pay | Admitting: Hematology and Oncology

## 2017-07-27 ENCOUNTER — Ambulatory Visit (HOSPITAL_BASED_OUTPATIENT_CLINIC_OR_DEPARTMENT_OTHER): Payer: Medicare Other | Admitting: Hematology and Oncology

## 2017-07-27 VITALS — BP 83/44 | HR 73 | Temp 97.4°F | Resp 17 | Ht 68.0 in | Wt 268.9 lb

## 2017-07-27 DIAGNOSIS — N183 Chronic kidney disease, stage 3 unspecified: Secondary | ICD-10-CM

## 2017-07-27 DIAGNOSIS — R188 Other ascites: Secondary | ICD-10-CM | POA: Diagnosis not present

## 2017-07-27 DIAGNOSIS — Z952 Presence of prosthetic heart valve: Secondary | ICD-10-CM | POA: Diagnosis not present

## 2017-07-27 DIAGNOSIS — K922 Gastrointestinal hemorrhage, unspecified: Secondary | ICD-10-CM

## 2017-07-27 DIAGNOSIS — D500 Iron deficiency anemia secondary to blood loss (chronic): Secondary | ICD-10-CM

## 2017-07-27 DIAGNOSIS — D631 Anemia in chronic kidney disease: Secondary | ICD-10-CM | POA: Diagnosis not present

## 2017-07-27 DIAGNOSIS — D6959 Other secondary thrombocytopenia: Secondary | ICD-10-CM

## 2017-07-27 DIAGNOSIS — K7460 Unspecified cirrhosis of liver: Secondary | ICD-10-CM | POA: Diagnosis not present

## 2017-07-27 DIAGNOSIS — I509 Heart failure, unspecified: Secondary | ICD-10-CM

## 2017-07-27 DIAGNOSIS — D731 Hypersplenism: Secondary | ICD-10-CM

## 2017-07-27 DIAGNOSIS — D638 Anemia in other chronic diseases classified elsewhere: Secondary | ICD-10-CM

## 2017-07-27 NOTE — Telephone Encounter (Signed)
Gave avs and calendar for march and April

## 2017-07-31 DIAGNOSIS — E1122 Type 2 diabetes mellitus with diabetic chronic kidney disease: Secondary | ICD-10-CM | POA: Diagnosis not present

## 2017-07-31 DIAGNOSIS — I482 Chronic atrial fibrillation: Secondary | ICD-10-CM | POA: Diagnosis not present

## 2017-07-31 DIAGNOSIS — I5032 Chronic diastolic (congestive) heart failure: Secondary | ICD-10-CM | POA: Diagnosis not present

## 2017-07-31 DIAGNOSIS — N183 Chronic kidney disease, stage 3 (moderate): Secondary | ICD-10-CM | POA: Diagnosis not present

## 2017-07-31 DIAGNOSIS — I2510 Atherosclerotic heart disease of native coronary artery without angina pectoris: Secondary | ICD-10-CM | POA: Diagnosis not present

## 2017-07-31 DIAGNOSIS — I130 Hypertensive heart and chronic kidney disease with heart failure and stage 1 through stage 4 chronic kidney disease, or unspecified chronic kidney disease: Secondary | ICD-10-CM | POA: Diagnosis not present

## 2017-08-02 DIAGNOSIS — I130 Hypertensive heart and chronic kidney disease with heart failure and stage 1 through stage 4 chronic kidney disease, or unspecified chronic kidney disease: Secondary | ICD-10-CM | POA: Diagnosis not present

## 2017-08-02 DIAGNOSIS — E1122 Type 2 diabetes mellitus with diabetic chronic kidney disease: Secondary | ICD-10-CM | POA: Diagnosis not present

## 2017-08-02 DIAGNOSIS — N183 Chronic kidney disease, stage 3 (moderate): Secondary | ICD-10-CM | POA: Diagnosis not present

## 2017-08-02 DIAGNOSIS — I5032 Chronic diastolic (congestive) heart failure: Secondary | ICD-10-CM | POA: Diagnosis not present

## 2017-08-02 DIAGNOSIS — I482 Chronic atrial fibrillation: Secondary | ICD-10-CM | POA: Diagnosis not present

## 2017-08-02 DIAGNOSIS — I2510 Atherosclerotic heart disease of native coronary artery without angina pectoris: Secondary | ICD-10-CM | POA: Diagnosis not present

## 2017-08-06 NOTE — Progress Notes (Signed)
[facility] Follow-up Visit:  Assessment: Iron deficiency anemia 72 y.o. male with extensive comorbidities including congestive heart failure, replaced aortic valve, hepatic cirrhosis and history of gastrointestinal bleeding due to portal gastropathy.  My evaluation is that of a complex anemia including elements of anemia due to iron deficiency due to chronic gastrointestinal blood loss, anemia of chronic disease, anemia due to hepatic dysfunction of cirrhosis, and possibly a component of anemia due to a decreased erythropoietin production due to chronic renal disease.  In the interim, patient has been on iron supplementation with gradually improving iron storage values.  Anemia is slightly worse today than previously.  Symptomatically, patient is stable with no new evidence of bleeding or hepatic decompensation.  Plan: --continue oral iron supplementation. --Will reevaluate erythropoietin level with her next clinic visit for possible need to initiate erythropoietin supplementation. --RTC 3 month for hematological monitoring  Voice recognition software was used and creation of this note. Despite my best effort at editing the text, some misspelling/errors may have occurred.   Orders Placed This Encounter  Procedures  . CBC & Diff and Retic    Standing Status:   Future    Standing Expiration Date:   07/27/2018  . Comprehensive metabolic panel    Standing Status:   Future    Standing Expiration Date:   07/27/2018  . Lactate dehydrogenase (LDH)    Standing Status:   Future    Standing Expiration Date:   07/27/2018  . Ferritin    Standing Status:   Future    Standing Expiration Date:   07/27/2018  . Erythropoietin    Standing Status:   Future    Standing Expiration Date:   07/27/2018  . Iron and TIBC    Standing Status:   Future    Standing Expiration Date:   07/27/2018    All questions were answered.  . The patient knows to call the clinic with any problems, questions or  concerns.  This note was electronically signed.    History of Presenting Illness [name] is a 72 y.o. male followed in the [facility] for thrombocytopenia and anemia. 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 Please see the results of the recent lab work below for details.  The present time, patient reports being at the baseline of his health. Denies any interval increase in fatigue. He does have previous history of gastrointestinal bleeding had previous endoscopic evaluations revealing bleeding polyps in the stomach as well as chronic gastritis with varices. He also complains of chronic and stable dyspnea on exertion.  Patient returns to the clinic for continued monitoring.  In the interim, he denies any new symptoms.  Patient was restarted on Zoloft for ongoing depression management.  Denies any recurrent bleeding.  Reports taking iron regularly.  Oncological/hematological History: --Labs, 01/14/16: Hgb 11.2, MCV 82.4, MCH 25.3, RDW 18.5, Plt 180;  --Labs, 06/28/16: Hgb 10.7, MCV 82.5, MCH 25.8, RDW 17.9, Plt 164;  --Colonoscopy/EGD, Apr 2018: Grade 1 esophageal varices. 0.7 cm sessile polyp in the gastric body with active bleeding, 2 polyps in the prepyloric stomach with evidence of bleeding. Gastropathy with vascular ectasia. Colonoscopy significant for splenic flexure lipoma and external hemorrhoids without evidence of active bleeding. --Labs, 10/26/16: Hgb 10.2, MCV 82.7, MCH 25.2, RDW 18.7, Plt 117;  --Labs, 02/21/17: Hgb   9.6,  MCV 88.3, MCH 27.5, RDW 18.6, Plt 137;  --Labs, 04/04/17: Hgb   9.5, MCV 83.9, MCH 25.1, RDW 18.0, Plt 188; Cr 2.52 --Labs, 04/11/17: Hgb 10.0, MCV 83.2, MCH 25.1, RDW 17.5, Plt 167; Fe 110, FeSat 22%, TIBC 508, Ferritin 43, Folate  >20, Vit B12 1167; Haptoglobin 213; TSH 3.24 --Ferrous sulfate 327m 2-3/day --Labs, 07/24/17: Hgb   9.2, MCV 87.4, MCH 26.4, RDW     ..., Plt 138; Fe 115, FeSat 22%, TIBC 533, Ferritin 53  Medical History: Past Medical History:  Diagnosis Date  . Aortic stenosis 03/08/2012   a.  s/p tissue AVR 10/13 with Dr. HRoxan Hockey   b. Echo 10/13: mod LVH, EF 55-60%, tissue AVR not well seen, no leak, gradient not too high (mean 117mg), MAC, mild MR, mild LAE, PASP 38  . Ascites    status post paracentesis with removal of 3.4 L of ascitic fluid  . Atrial fibrillation ([facility]   Permanent; off of Coumadin for now due to GI bleed  . AVM (arteriovenous malformation)    Recurrent GI bleeding requiring multiple transfusions  . CAD (coronary artery disease)    a. s/p CABG 2004;  b. LHNicholls0/13:  LHC 8/13: Free radial to obtuse marginal patent, SVG-diagonal patent, LIMA-LAD patent, EF 65-70%, mean aortic valve gradient 42  . Carotid bruit 06/14/2011   a. pre-AVR dopplers 10/13: no sig ICA stenosis  . Chronic diastolic heart failure ([place] [place]  . Cirrhosis ([facility]  . COPD (chronic obstructive pulmonary disease) ([facility]  . DM2 (diabetes mellitus, type 2) (HCC)    at least 10 yrs  . GERD (gastroesophageal reflux disease)   . H/O hiatal hernia   . Hyperlipidemia   . Hypertension    x 15 yrs  . Insomnia   . Iron deficiency anemia    Requiring intravenous iron  . Mediastinal adenopathy 09/22/2011  . OSA (obstructive sleep apnea) 1999    USES CPAP  . Osteoarthritis   . Overweight(278.02)   . [facility]    Surgical History: Past Surgical History:  Procedure Laterality Date  . ABLATION  04-18-14   AVN ablation by Dr KlCaryl Comes. AORTIC VALVE REPLACEMENT  04/25/2012   Procedure: AORTIC VALVE REPLACEMENT (AVR);  Surgeon: StMelrose NakayamaMD;  [facility] Service: Open Heart Surgery;  Laterality: N/A;  . AV NODE ABLATION N/A 04/18/2014   Procedure: AV NODE ABLATION;  Surgeon: StDeboraha SprangMD;  [facility];  Service: Cardiovascular;  Laterality: N/A;  . CARPAL TUNNEL RELEASE    10/08/2003  . CATARACT EXTRACTION W/ INTRAOCULAR LENS IMPLANT Bilateral 09/28/2015 , 10/19/2015  . COLONOSCOPY WITH PROPOFOL N/A 10/28/2016   Procedure: COLONOSCOPY WITH PROPOFOL;  Surgeon: NaRogene HoustonMD;  [facility];  Service: Endoscopy;  Laterality: N/A;  . CORONARY ANGIOPLASTY WITH STENT PLACEMENT  01/19/2005   drug eluting stent to high grade ostial stenosis of radial artery graft to OM  . CORONARY ARTERY BYPASS GRAFT   10/15/2002    StRevonda StandardHeRoxan HockeyM.D.     . ESOPHAGOGASTRODUODENOSCOPY N/A 07/31/2013   Procedure: ESOPHAGOGASTRODUODENOSCOPY (EGD);  Surgeon: DaMilus BanisterMD;  [facility] Service: Endoscopy;  Laterality: N/A;  . ESOPHAGOGASTRODUODENOSCOPY Left 01/11/2014   Procedure: ESOPHAGOGASTRODUODENOSCOPY (EGD);  Surgeon: JyJuanita CraverMD;  [facility];  Service: Endoscopy;  Laterality: Left;  . ESOPHAGOGASTRODUODENOSCOPY (EGD) WITH PROPOFOL N/A 10/28/2016   Procedure: ESOPHAGOGASTRODUODENOSCOPY (EGD) WITH PROPOFOL;  Surgeon: NaMechele Dawley  Laural Golden, MD;  Location[facility];  Service: Endoscopy;  Laterality: N/A;  . HERNIA REPAIR    . KNEE ARTHROSCOPY Right 01/20/2016   Procedure: ARTHROSCOPY RIGHT KNEE WITH MENSICAL DEBRIDEMENT;  Surgeon: Gaynelle Arabian, MD;  Location: [facility] ORS;  Service: Orthopedics;  Laterality: Right;  . LEFT AND RIGHT HEART CATHETERIZATION WITH CORONARY/GRAFT ANGIOGRAM N/A 03/07/2012   Procedure: LEFT AND RIGHT HEART CATHETERIZATION WITH Beatrix Fetters;  Surgeon: Sherren Mocha, MD;  Location: [facility] CATH LAB;  Service: Cardiovascular;  Laterality: N/A;  . lipoma surgery    . OTHER SURGICAL HISTORY  08/26/2011   [facility],  enteroscopy , revealing "three-four AVMs."   . PACEMAKER INSERTION  03-24-14   STJ Assurity single chamber pacemaker implanted by Dr Caryl Comes  . PARACENTESIS  12/2015  . PERMANENT PACEMAKER INSERTION N/A  03/24/2014   Procedure: PERMANENT PACEMAKER INSERTION;  Surgeon: Deboraha Sprang, MD;  Location: [facility] CATH LAB;  Service: Cardiovascular;  Laterality: N/A;  . POLYPECTOMY  10/28/2016   Procedure: POLYPECTOMY;  Surgeon: Rogene Houston, MD;  [facility];  Service: Endoscopy;;  gastric  . RIGHT HEART CATHETERIZATION N/A 01/01/2014   Procedure: RIGHT HEART CATH;  Surgeon: Jolaine Artist, MD;  Location: [facility] CATH LAB;  Service: Cardiovascular;  Laterality: N/A;  . TEE WITHOUT CARDIOVERSION  03/07/2012   Procedure: TRANSESOPHAGEAL ECHOCARDIOGRAM (TEE);  Surgeon: Thayer Headings, MD;  Location: [facility] ENDOSCOPY;  Service: Cardiovascular;  Laterality: N/A;    Family History: Family History  Problem Relation Age of Onset  . Hypertension Father   . Diabetes Father   . Heart disease Father   . COPD Sister   . Cancer Maternal Aunt        Breast cancer   . Cancer Maternal Aunt        Breast cancer   . Diabetes Son   . Cancer Daughter        Cervical cancer  . Coronary artery disease Unknown        FAMILY HISTORY    Social History: Social History   Socioeconomic History  . Marital status: Married    Spouse name: Not on file  . Number of children: 3  . Years of education: Not on file  . Highest education level: Not on file  Social Needs  . Financial resource strain: Not on file  . Food insecurity - worry: Not on file  . Food insecurity - inability: Not on file  . Transportation needs - medical: Not on file  . Transportation needs - non-medical: Not on file  Occupational History    Comment: Associate Professor at [employer]  . Smoking status: Former Smoker    Packs/day: 1.00    Years: 30.00    Pack years: 30.00    Types: Cigarettes    Last attempt to quit: 07/25/2000    Years since quitting: 17.0  . Smokeless tobacco: Never Used  . Tobacco comment: Quit smoking in 2002  Substance and Sexual Activity  . Alcohol use: No    Alcohol/week: 0.6 oz    Types: 1 Shots of  liquor per week    Comment: stopped drinking alcohol  . Drug use: No  . Sexual activity: Yes  Other Topics Concern  . Not on file  Social History Narrative   Lives in [place], [place] with wife.     Allergies: Allergies  Allergen Reactions  . Codeine Other (See Comments) and Palpitations    Hurting in chest  . Diltiazem Hcl  Itching  . Niacin Other (See Comments)    Felt a "severe burning sensation" Hot flashes  . Aspirin     History of Bleeding ulcers   . Diltiazem Hcl Other (See Comments) and Hives    Gets hot    Medications:  Current Outpatient Medications  Medication Sig Dispense Refill  . acetaminophen (TYLENOL) 500 MG tablet Take 1,000 mg by mouth at bedtime as needed for mild pain or headache.     . allopurinol (ZYLOPRIM) 100 MG tablet Take 1 tablet (100 mg total) by mouth daily. 90 tablet 3  . atorvastatin (LIPITOR) 20 MG tablet Take 20 mg by mouth daily at 6 PM.     . colchicine 0.6 MG tablet Take 0.6 mg by mouth daily.     . ferrous gluconate (FERGON) 324 MG tablet Take 324 mg by mouth 3 (three) times daily with meals.     . gabapentin (NEURONTIN) 300 MG capsule Take 300 mg by mouth 3 (three) times daily.  6  . insulin NPH Human (HUMULIN N,NOVOLIN N) 100 UNIT/ML injection Inject 65 Units into the skin 2 (two) times daily before a meal.     . insulin regular (NOVOLIN R,HUMULIN R) 100 units/mL injection Inject 30 Units into the skin 2 (two) times daily before a meal.     . methocarbamol (ROBAXIN) 500 MG tablet Take 1 tablet (500 mg total) by mouth 4 (four) times daily. As needed for muscle spasm 30 tablet 1  . metolazone (ZAROXOLYN) 5 MG tablet Take 1 tablet (5 mg total) by mouth every 3 (three) days. 15 tablet 3  . metoprolol succinate (TOPROL-XL) 100 MG 24 hr tablet Take 1 tablet (100 mg total) by mouth 2 (two) times daily. 180 tablet 6  . Multiple Vitamins-Minerals ([employer] SPECTRAVITE ADULT 50+ PO) Take 1 tablet by mouth daily.    [name] omeprazole (PRILOSEC) 20 MG capsule  Take 1 capsule (20 mg total) by mouth 2 (two) times daily before a meal. 30 capsule 5  . potassium chloride SA (K-DUR,KLOR-CON) 20 MEQ tablet Take 2 tablets (40 mEq total) by mouth 2 (two) times daily. 120 tablet 3  . PROAIR HFA 108 (90 Base) MCG/ACT inhaler Take 2 puffs by mouth 2 (two) times daily.    . sertraline (ZOLOFT) 50 MG tablet Take 1 tablet (50 mg total) by mouth daily. 30 tablet 1  . spironolactone (ALDACTONE) 50 MG tablet Take 1 tablet (50 mg total) by mouth 2 (two) times daily. 180 tablet 3  . tamsulosin (FLOMAX) 0.4 MG CAPS capsule TAKE ONE CAPSULE BY MOUTH EVERY DAY AFTER SUPPER 90 capsule 3  . torsemide (DEMADEX) 20 MG tablet Take 6 tablets (120 mg total) by mouth 2 (two) times daily. 360 tablet 3  . vitamin C (ASCORBIC ACID) 500 MG tablet Take 500 mg by mouth 2 (two) times daily.     No current facility-administered medications for this visit.     Review of Systems: Review of Systems  Respiratory: Positive for shortness of breath.   All other systems reviewed and are negative.    PHYSICAL EXAMINATION Blood pressure (!) 83/44, pulse 73, temperature (!) 97.4 F (36.3 C), temperature source Oral, resp. rate 17, height _0  (1.727 m), weight 268 lb 14.4 oz (122 kg), SpO2 100 %.  ECOG PERFORMANCE STATUS: 1 - Symptomatic but completely ambulatory  Physical Exam  Constitutional: He is oriented to person, place, and time and well-developed, well-nourished, and in no distress.  HENT:  Head: Normocephalic and atraumatic.  Mouth/Throat: Oropharynx is clear and moist and mucous membranes are normal.  Eyes: Conjunctivae and EOM are normal. Pupils are equal, round, and reactive to light. No scleral icterus.  Neck: No thyromegaly present.  Cardiovascular: Normal rate and regular rhythm.  Murmur heard. 2/6 early systolic murmur appreciated over the base of the heart  Pulmonary/Chest: Effort normal and breath sounds normal. No respiratory distress. He has no wheezes.  Abdominal:  Soft. Bowel sounds are normal. He exhibits no distension and no mass. There is no tenderness.  Musculoskeletal: He exhibits no edema.  Lymphadenopathy:    He has no cervical adenopathy.  Neurological: He is alert and oriented to person, place, and time. No cranial nerve deficit.  Skin: Skin is warm and dry. No rash noted. No erythema. No pallor.     LABORATORY DATA: I have personally reviewed the data as listed: Appointment on 07/24/2017  Component Date Value Ref Range Status  . WBC 07/24/2017 7.7  4.0 - 10.3 10e3/uL Final  . NEUT# 07/24/2017 5.8  1.5 - 6.5 10e3/uL Final  . HGB 07/24/2017 9.2* 13.0 - 17.1 g/dL Final  . HCT 07/24/2017 30.5* 38.4 - 49.9 % Final  . Platelets 07/24/2017 138* 140 - 400 10e3/uL Final  . MCV 07/24/2017 87.4  79.3 - 98.0 fL Final  . MCH 07/24/2017 26.4* 27.2 - 33.4 pg Final  . MCHC 07/24/2017 30.2* 32.0 - 36.0 g/dL Final  . RBC 07/24/2017 3.49* 4.20 - 5.82 10e6/uL Final  . RDW 07/24/2017 17.7* 11.0 - 14.6 % Final  . lymph# 07/24/2017 1.0  0.9 - 3.3 10e3/uL Final  . MONO# 07/24/2017 0.7  0.1 - 0.9 10e3/uL Final  . Eosinophils Absolute 07/24/2017 0.1  0.0 - 0.5 10e3/uL Final  . Basophils Absolute 07/24/2017 0.0  0.0 - 0.1 10e3/uL Final  . NEUT% 07/24/2017 75.8* 39.0 - 75.0 % Final  . LYMPH% 07/24/2017 12.6* 14.0 - 49.0 % Final  . MONO% 07/24/2017 9.5  0.0 - 14.0 % Final  . EOS% 07/24/2017 1.7  0.0 - 7.0 % Final  . BASO% 07/24/2017 0.4  0.0 - 2.0 % Final  . Retic % 07/24/2017 4.22* 0.80 - 1.80 % Final  . Retic Ct Abs 07/24/2017 147.28* 34.80 - 93.90 10e3/uL Final  . Immature Retic Fract 07/24/2017 16.20* 3.00 - 10.60 % Final  . Sodium 07/24/2017 131* 136 - 145 mEq/L Final  . Potassium 07/24/2017 4.6  3.5 - 5.1 mEq/L Final  . Chloride 07/24/2017 93* 98 - 109 mEq/L Final  . CO2 07/24/2017 25  22 - 29 mEq/L Final  . Glucose 07/24/2017 86  70 - 140 mg/dl Final   Glucose reference range is for nonfasting patients. Fasting glucose reference range is 70- 100.   [name] BUN 07/24/2017 97.4* 7.0 - 26.0 mg/dL Final  . Creatinine 07/24/2017 2.8* 0.7 - 1.3 mg/dL Final  . Total Bilirubin 07/24/2017 0.66  0.20 - 1.20 mg/dL Final  . Alkaline Phosphatase 07/24/2017 66  40 - 150 U/L Final  . AST 07/24/2017 25  5 - 34 U/L Final  . ALT 07/24/2017 23  0 - 55 U/L Final  . Total Protein 07/24/2017 7.6  6.4 - 8.3 g/dL Final  . Albumin 07/24/2017 4.1  3.5 - 5.0 g/dL Final  . Calcium 07/24/2017 9.8  8.4 - 10.4 mg/dL Final  . Anion Gap 07/24/2017 13* 3 - 11 mEq/L Final  . EGFR 07/24/2017 22* >60 ml/min/1.73 m2 Final   eGFR is calculated using the CKD-EPI Creatinine Equation (2009)  . Ferritin  07/24/2017 53  22 - 316 ng/ml Final  . Iron 07/24/2017 115  42 - 163 ug/dL Final  . TIBC 07/24/2017 533* 202 - 409 ug/dL Final  . UIBC 07/24/2017 418* 117 - 376 ug/dL Final  . %SAT 07/24/2017 22  20 - 55 % Final  . LDH 07/24/2017 172  125 - 245 U/L Final  . Technologist Review 07/24/2017 Few teardrop RBCs, occ shistocytes; sl. polychromasia   Final       Ardath Sax, MD

## 2017-08-06 NOTE — Assessment & Plan Note (Signed)
72 y.o. male with extensive comorbidities including congestive heart failure, replaced aortic valve, hepatic cirrhosis and history of gastrointestinal bleeding due to portal gastropathy.  My evaluation is that of a complex anemia including elements of anemia due to iron deficiency due to chronic gastrointestinal blood loss, anemia of chronic disease, anemia due to hepatic dysfunction of cirrhosis, and possibly a component of anemia due to a decreased erythropoietin production due to chronic renal disease.  In the interim, patient has been on iron supplementation with gradually improving iron storage values.  Anemia is slightly worse today than previously.  Symptomatically, patient is stable with no new evidence of bleeding or hepatic decompensation.  Plan: --continue oral iron supplementation. --Will reevaluate erythropoietin level with her next clinic visit for possible need to initiate erythropoietin supplementation. --RTC 3 month for hematological monitoring

## 2017-08-07 DIAGNOSIS — I5032 Chronic diastolic (congestive) heart failure: Secondary | ICD-10-CM | POA: Diagnosis not present

## 2017-08-07 DIAGNOSIS — I482 Chronic atrial fibrillation: Secondary | ICD-10-CM | POA: Diagnosis not present

## 2017-08-07 DIAGNOSIS — E1122 Type 2 diabetes mellitus with diabetic chronic kidney disease: Secondary | ICD-10-CM | POA: Diagnosis not present

## 2017-08-07 DIAGNOSIS — I2510 Atherosclerotic heart disease of native coronary artery without angina pectoris: Secondary | ICD-10-CM | POA: Diagnosis not present

## 2017-08-07 DIAGNOSIS — I130 Hypertensive heart and chronic kidney disease with heart failure and stage 1 through stage 4 chronic kidney disease, or unspecified chronic kidney disease: Secondary | ICD-10-CM | POA: Diagnosis not present

## 2017-08-07 DIAGNOSIS — N183 Chronic kidney disease, stage 3 (moderate): Secondary | ICD-10-CM | POA: Diagnosis not present

## 2017-08-09 DIAGNOSIS — N183 Chronic kidney disease, stage 3 (moderate): Secondary | ICD-10-CM | POA: Diagnosis not present

## 2017-08-09 DIAGNOSIS — I130 Hypertensive heart and chronic kidney disease with heart failure and stage 1 through stage 4 chronic kidney disease, or unspecified chronic kidney disease: Secondary | ICD-10-CM | POA: Diagnosis not present

## 2017-08-09 DIAGNOSIS — I5032 Chronic diastolic (congestive) heart failure: Secondary | ICD-10-CM | POA: Diagnosis not present

## 2017-08-09 DIAGNOSIS — I482 Chronic atrial fibrillation: Secondary | ICD-10-CM | POA: Diagnosis not present

## 2017-08-09 DIAGNOSIS — E1122 Type 2 diabetes mellitus with diabetic chronic kidney disease: Secondary | ICD-10-CM | POA: Diagnosis not present

## 2017-08-09 DIAGNOSIS — I2510 Atherosclerotic heart disease of native coronary artery without angina pectoris: Secondary | ICD-10-CM | POA: Diagnosis not present

## 2017-08-10 ENCOUNTER — Ambulatory Visit (HOSPITAL_COMMUNITY)
Admission: RE | Admit: 2017-08-10 | Discharge: 2017-08-10 | Disposition: A | Discharge status: Home / Self Care | Payer: Medicare Other | Source: Ambulatory Visit | Attending: Cardiology | Admitting: Cardiology

## 2017-08-10 VITALS — BP 124/74 | HR 78 | Wt 277.5 lb

## 2017-08-10 DIAGNOSIS — Z953 Presence of xenogenic heart valve: Secondary | ICD-10-CM | POA: Diagnosis not present

## 2017-08-10 DIAGNOSIS — I482 Chronic atrial fibrillation: Secondary | ICD-10-CM | POA: Insufficient documentation

## 2017-08-10 DIAGNOSIS — R188 Other ascites: Secondary | ICD-10-CM | POA: Insufficient documentation

## 2017-08-10 DIAGNOSIS — M109 Gout, unspecified: Secondary | ICD-10-CM | POA: Diagnosis not present

## 2017-08-10 DIAGNOSIS — F329 Major depressive disorder, single episode, unspecified: Secondary | ICD-10-CM | POA: Diagnosis not present

## 2017-08-10 DIAGNOSIS — Z9989 Dependence on other enabling machines and devices: Secondary | ICD-10-CM | POA: Diagnosis not present

## 2017-08-10 DIAGNOSIS — I5032 Chronic diastolic (congestive) heart failure: Secondary | ICD-10-CM | POA: Insufficient documentation

## 2017-08-10 DIAGNOSIS — Z79899 Other long term (current) drug therapy: Secondary | ICD-10-CM | POA: Diagnosis not present

## 2017-08-10 DIAGNOSIS — K219 Gastro-esophageal reflux disease without esophagitis: Secondary | ICD-10-CM | POA: Insufficient documentation

## 2017-08-10 DIAGNOSIS — Z794 Long term (current) use of insulin: Secondary | ICD-10-CM | POA: Diagnosis not present

## 2017-08-10 DIAGNOSIS — I5022 Chronic systolic (congestive) heart failure: Secondary | ICD-10-CM | POA: Diagnosis not present

## 2017-08-10 DIAGNOSIS — I2510 Atherosclerotic heart disease of native coronary artery without angina pectoris: Secondary | ICD-10-CM | POA: Insufficient documentation

## 2017-08-10 DIAGNOSIS — Z951 Presence of aortocoronary bypass graft: Secondary | ICD-10-CM | POA: Diagnosis not present

## 2017-08-10 DIAGNOSIS — G4733 Obstructive sleep apnea (adult) (pediatric): Secondary | ICD-10-CM | POA: Diagnosis not present

## 2017-08-10 DIAGNOSIS — K761 Chronic passive congestion of liver: Secondary | ICD-10-CM | POA: Diagnosis not present

## 2017-08-10 DIAGNOSIS — Z8249 Family history of ischemic heart disease and other diseases of the circulatory system: Secondary | ICD-10-CM | POA: Diagnosis not present

## 2017-08-10 DIAGNOSIS — I4821 Permanent atrial fibrillation: Secondary | ICD-10-CM

## 2017-08-10 DIAGNOSIS — E1122 Type 2 diabetes mellitus with diabetic chronic kidney disease: Secondary | ICD-10-CM | POA: Insufficient documentation

## 2017-08-10 DIAGNOSIS — E785 Hyperlipidemia, unspecified: Secondary | ICD-10-CM | POA: Insufficient documentation

## 2017-08-10 DIAGNOSIS — K7581 Nonalcoholic steatohepatitis (NASH): Secondary | ICD-10-CM | POA: Diagnosis not present

## 2017-08-10 DIAGNOSIS — I130 Hypertensive heart and chronic kidney disease with heart failure and stage 1 through stage 4 chronic kidney disease, or unspecified chronic kidney disease: Secondary | ICD-10-CM | POA: Insufficient documentation

## 2017-08-10 DIAGNOSIS — J449 Chronic obstructive pulmonary disease, unspecified: Secondary | ICD-10-CM | POA: Insufficient documentation

## 2017-08-10 DIAGNOSIS — N184 Chronic kidney disease, stage 4 (severe): Secondary | ICD-10-CM | POA: Diagnosis not present

## 2017-08-10 LAB — BASIC METABOLIC PANEL
Anion gap: 15 (ref 5–15)
BUN: 100 mg/dL — AB (ref 6–20)
CALCIUM: 8.8 mg/dL — AB (ref 8.9–10.3)
CHLORIDE: 93 mmol/L — AB (ref 101–111)
CO2: 20 mmol/L — AB (ref 22–32)
Creatinine, Ser: 2.93 mg/dL — ABNORMAL HIGH (ref 0.61–1.24)
GFR calc Af Amer: 24 mL/min — ABNORMAL LOW (ref >60)
GFR calc non Af Amer: 20 mL/min — ABNORMAL LOW (ref >60)
GLUCOSE: 163 mg/dL — AB (ref 65–99)
Potassium: 4.6 mmol/L (ref 3.5–5.1)
Sodium: 128 mmol/L — ABNORMAL LOW (ref 135–145)

## 2017-08-10 NOTE — Patient Instructions (Signed)
Paracentesis today at 11:30 am.  Routine lab work today. Will notify you of abnormal results, otherwise no news is good news!  Follow up 3-4 weeks with Dr. Aundra Dubin.  Take all medication as prescribed the day of your appointment. Bring all medications with you to your appointment.  Do the following things EVERYDAY: 1) Weigh yourself in the morning before breakfast. Write it down and keep it in a log. 2) Take your medicines as prescribed 3) Eat low salt foods-Limit salt (sodium) to 2000 mg per day.  4) Stay as active as you can everyday 5) Limit all fluids for the day to less than 2 liters

## 2017-08-10 NOTE — Progress Notes (Signed)
Patient ID: [name], male   DOB: 12/03/1945, 72 y.o.   MRN: [id number]    Advanced Heart Failure Clinic Note   GI: Dr Laural Golden- followed for cirrhosis Pulmonary: Dr Melvyn Novas  PCP: Marjean Donna [facility])  Cardiology: Dr. Aundra Dubin Nephrology: Dr Joelyn Oms  [name] is a 72 y.o. 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  Last LHC in 8/13 showed patent grafts.  AVR was in 10/13.  Last echo in 1/18 showed EF 60-65%, normal bioprosthetic aortic valve.   July 2015:  Admitted to the hospital for GI bleed requiring 2 PRBCs. No additional GI work-up with recent EGD showing gastritis.  He had an AV nodal ablation in 9/15 with significant improvement after this.  He has St Jude PPM.   In 6/17, had right knee operation.    Today he returns for HF follow up with his wife. Last paracentesis 05/24/2017. In December Metolazone was changed to every 3rd day.  SOB with any exertion + Orthopnea. Denies PND. Requires assistance with ADLs. Not using CPAP. Weight at home trending up to 274 pounds. Working with PT 2 times a week. Not moving around much at home. No fever or chills. No bleeding problems. Drinking over 2 liters per day.  Taking all medications.   Labs (8/14) LDL 38 Labs (3/15) K 3.4, creatinine 1.7, BUN 55  Labs (10/15/13) K 4.0 Creatinine 1.6 BUN 32 Labs (10/29/13) K 4.0 Creatinine 1.6  Labs (4/15) K 4.2 Creatinine 1.32 => 1.5, HCT 35.2 Labs (12/11/13) K 3.6 Creatinine 1.56 Pro BNP 1152 Hemoglobin 9.6  Labs (01/12/14) K 3.0, creatinine 2.0, hemoglobin 8.8 Labs (01/31/14) K 3.3, creatinine 1.49 Labs (8/15) K 3.9, creatinine 1.89 Labs (9/15): K 3.7, creatinine 1.6, AST 48, ALT 52, TSH normal Labs (10/15): K 3.5, creatinine 1.58, BNP 780 Labs (3/16): K 3.5, creatinine 1.6 Labs 12/03/2014: K 4.4 Creatinine  1.55 Labs 12/12/2014: K 3.7 Creatinine 1.48, LDL 50 Labs 8/16: K 3.8, creatinine 2.42 Labs 04/07/2015: K 4.3, Creatinine 2.22, BNP 169 Labs (11/16): K 4, creatinine 1.78 => 1.55, BNP 120 Labs 11/11/2015 K 3.7 Creatinine 1.53  Labs (6/17): K 3.8, creatinine 1.52 Labs (12/17): K 3.5, creatinine 1.95, LDL 32 Labs (1/18): K 4.2, creatinine 2.51 Labs (4/18): K 3.6, creatinine 2.5, hgb 10.2 Labs (10/18): K 3.5, creatinine 2.72, hgb 10.4 Labs (07/11/2018): K 3.2 Creatinine 2.9  Labs (07/24/2017): K 4.6 Creatinine 2.8   PMH: 1. CAD: s/p CABG 2004. Last LHC (8/13) with patent free radial-OM, patent SVG-D, patent LIMA-LAD.  2. Permanent atrial fibrillation: Not anticoagulated due to GI bleeding.  Holter monitor (4/15) with mean HR 115 (afib).   He had AV nodal ablation with St Jude dual chamber PPM  3. Chronic diastolic CHF: Echo (4/09) with EF 55-60%, bioprosthetic aortic valve with mean gradient 22 mmHg, mildly decreased RV systolic function. RHC (12/2013): RA 14, RV 44/4/11, PA 50/23 (33), PCWP 20, v = 35, Fick CO/CI: 6.0/2.6, PVR 2.2 WU, PA 59%.  Echo (8/15) with EF 55-60%, mild LVH, bioprosthetic aortic valve with mean gradient 16 mmHg, PA systolic pressure 46 mmHg, mild to moderate Matthew.  Echo (8/16) with EF 55-60%, normal bioprosthetic aortic valve, mild Matthew, PASP 44 mmHg.  - Gynecomastia with spironolactone.  - Echo (1/18): EF 60-65%, grade II diastolic dysfunction, bioprosthetic aortic valve appeared normal, PA systolic pressure 59 mmHg.  4. Severe aortic stenosis s/p bioprosthetic AVR in 10/13. Mean gradient 22 mmHg across valve on echo in 8/14.  Mean gradient 16 mmHg on echo in 8/15. Mean gradient 15 mmHg by echo in 1/18.  5. NASH with cirrhosis: ascites, thrombocytopenia.  CT abdomen in 6/17 showed cirrhosis and moderate ascites. He has regular paracenteses.  6. 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  EGD in 1/15 showed mild portal gastropathy and 2 small antral ulcers. Recurrent GI bleed in 6/15, EGD  showed gastritis and ?GAVE.  - EGD (4/18): grade I varices, gastric polyps, GAVE.   - Colonoscopy (4/18): No bleeding source.  7. Hyperlipidemia 8. HTN 9. GERD 10. COPD 72. OSA: Cannot tolerate CPAP.  72. 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  14. Carotid dopplers (11/15) with minimal stenosis.  15. ABIs 12/11/2014: normal   16. Depression  SH: Lives with wife in [place], prior smoker.   FH: CAD  ROS: All systems reviewed and negative except as per HPI.   Current Outpatient Medications on File Prior to Encounter  Medication Sig Dispense Refill  . acetaminophen (TYLENOL) 500 MG tablet Take 1,000 mg by mouth at bedtime as needed for mild pain or headache.     . allopurinol (ZYLOPRIM) 100 MG tablet Take 1 tablet (100 mg total) by mouth daily. 90 tablet 3  . atorvastatin (LIPITOR) 20 MG tablet Take 20 mg by mouth daily at 6 PM.     . colchicine 0.6 MG tablet Take 0.6 mg by mouth daily.     . ferrous gluconate (FERGON) 324 MG tablet Take 324 mg by mouth 3 (three) times daily with meals.     . gabapentin (NEURONTIN) 300 MG capsule Take 300 mg by mouth 3 (three) times daily.  6  . insulin NPH Human (HUMULIN N,NOVOLIN N) 100 UNIT/ML injection Inject 65 Units into the skin 2 (two) times daily before a meal.     . insulin regular (NOVOLIN R,HUMULIN R) 100 units/mL injection Inject 30 Units into the skin 2 (two) times daily before a meal.     . methocarbamol (ROBAXIN) 500 MG tablet Take 1 tablet (500 mg total) by mouth 4 (four) times daily. As needed for muscle spasm 30 tablet 1  . metolazone (ZAROXOLYN) 5 MG tablet Take 1 tablet (5 mg total) by mouth every 3 (three) days. 15 tablet 3  . metoprolol succinate (TOPROL-XL) 100 MG 24 hr tablet Take 1 tablet (100 mg total) by mouth 2 (two) times daily. 180 tablet 6  . Multiple Vitamins-Minerals ([employer] SPECTRAVITE ADULT 50+ PO) Take 1 tablet by mouth daily.    [name] omeprazole (PRILOSEC) 20 MG capsule Take 1 capsule (20 mg total) by mouth 2 (two) times daily  before a meal. 30 capsule 5  . potassium chloride SA (K-DUR,KLOR-CON) 20 MEQ tablet Take 2 tablets (40 mEq total) by mouth 2 (two) times daily. 120 tablet 3  . PROAIR HFA 108 (90 Base) MCG/ACT inhaler Take 2 puffs by mouth 2 (two) times daily.    . sertraline (ZOLOFT) 50 MG tablet Take 1 tablet (50 mg total) by mouth daily. 30 tablet 1  . spironolactone (ALDACTONE) 50 MG tablet Take 1 tablet (50 mg total) by mouth 2 (two) times daily. 180 tablet 3  . tamsulosin (FLOMAX) 0.4 MG CAPS capsule TAKE ONE CAPSULE BY MOUTH EVERY DAY AFTER SUPPER 90 capsule 3  . torsemide (DEMADEX) 20 MG tablet Take 6 tablets (120 mg total) by mouth 2 (two) times daily. 360 tablet 3  .  vitamin C (ASCORBIC ACID) 500 MG tablet Take 500 mg by mouth 2 (two) times daily.     No current facility-administered medications on file prior to encounter.     Vitals:   08/10/17 0952  BP: 124/74  Pulse: 78  SpO2: 95%  Weight: 277 lb 8 oz (125.9 kg)     Wt Readings from Last 3 Encounters:  08/10/17 277 lb 8 oz (125.9 kg)  07/27/17 268 lb 14.4 oz (122 kg)  07/11/17 274 lb 4 oz (124.4 kg)    General:  Appears chronically ill.  No resp difficulty HEENT: normal Neck: supple. JVP to jaw. Carotids 2+ bilat; no bruits. No lymphadenopathy or thryomegaly appreciated. Cor: PMI nondisplaced. Regular rate & rhythm. No rubs, gallops,  2/6 SEM RUSB.  Lungs: clear Abdomen: soft, nontender, ++distended. No hepatosplenomegaly. No bruits or masses. Good bowel sounds. Extremities: no cyanosis, clubbing, rash, R and LLE 1+ edema Neuro: alert & orientedx3, cranial nerves grossly intact. moves all 4 extremities w/o difficulty. Affect pleasant   Assessment/Plan: 1. Chronic diastolic CHF:  EF 36-62% (1/18).  NYHA class IIIb symptoms, stable.  He has had trouble with ascites due to cardiac cirrhosis/NASH.  Has not had paracentesis since 05/24/2017. Set up paracentesis today.  -Volume overloaded.  - BMET today.  . - Continue torsemide 120 mg  bid.    - Continue metolazone 5 mg every 3 days.    - Continue spironolactone 50 mg bid.   2. Chronic atrial fibrillation:  CHADSVASC 4 - age > 22, CHF, HTN, DM2. 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 s/p AV node ablation with dual chamber pacing given very difficult rate control. Hemoglobin has been fairly stable recently.  - Intolerant of ASA and coumadin even for short period, so no Watchman pursued. 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  3. CKD stage IV: BMET today. Needs to follow up with Dr Joelyn Oms.  4. Bioprosthetic AVR: Stable on 1/18 echo.    5. CAD: s/p CABG.  - No s/s ischemia - Continue atorvastatin 20 mg daily and Toprol XL 100 mg BID.  - Off ASA and anticoag with history of GI bleed.  6. Cirrhosis: May be a combination of cardiac cirrhosis and NASH-related. Follows with Dr. Laural Golden.  - Ascites better-controlled recently.  7. Depression: Depressed mood, anhedonia.  I think that this plays a significant role in his symptomatology.  -Continue sertraline 50 mg daily.  8. OSA: Encouraged to use CPAP.   Follow up 3 weeks with Dr Aundra Dubin.   Areta Terwilliger NP-C  08/10/2017

## 2017-08-14 DIAGNOSIS — I482 Chronic atrial fibrillation: Secondary | ICD-10-CM | POA: Diagnosis not present

## 2017-08-14 DIAGNOSIS — I5032 Chronic diastolic (congestive) heart failure: Secondary | ICD-10-CM | POA: Diagnosis not present

## 2017-08-14 DIAGNOSIS — I2510 Atherosclerotic heart disease of native coronary artery without angina pectoris: Secondary | ICD-10-CM | POA: Diagnosis not present

## 2017-08-14 DIAGNOSIS — I130 Hypertensive heart and chronic kidney disease with heart failure and stage 1 through stage 4 chronic kidney disease, or unspecified chronic kidney disease: Secondary | ICD-10-CM | POA: Diagnosis not present

## 2017-08-14 DIAGNOSIS — N183 Chronic kidney disease, stage 3 (moderate): Secondary | ICD-10-CM | POA: Diagnosis not present

## 2017-08-14 DIAGNOSIS — E1122 Type 2 diabetes mellitus with diabetic chronic kidney disease: Secondary | ICD-10-CM | POA: Diagnosis not present

## 2017-08-15 ENCOUNTER — Telehealth (HOSPITAL_COMMUNITY): Payer: Self-pay

## 2017-08-15 NOTE — Telephone Encounter (Signed)
Result Notes for Basic metabolic panel   Notes recorded by Effie Berkshire, RN on 08/15/2017 at 2:02 PM EST Patient's wife reports patient doing well at this time, has appt with [facility]: Dr Joelyn Oms 2/7 at 9:15 am. ------  Notes recorded by Conrad Staunton, NP on 08/10/2017 at 3:38 PM EST Needs follow up with Nephrology. Creatinine 2.9. BUN 100.

## 2017-08-16 DIAGNOSIS — N183 Chronic kidney disease, stage 3 (moderate): Secondary | ICD-10-CM | POA: Diagnosis not present

## 2017-08-16 DIAGNOSIS — I482 Chronic atrial fibrillation: Secondary | ICD-10-CM | POA: Diagnosis not present

## 2017-08-16 DIAGNOSIS — I130 Hypertensive heart and chronic kidney disease with heart failure and stage 1 through stage 4 chronic kidney disease, or unspecified chronic kidney disease: Secondary | ICD-10-CM | POA: Diagnosis not present

## 2017-08-16 DIAGNOSIS — I5032 Chronic diastolic (congestive) heart failure: Secondary | ICD-10-CM | POA: Diagnosis not present

## 2017-08-16 DIAGNOSIS — I2510 Atherosclerotic heart disease of native coronary artery without angina pectoris: Secondary | ICD-10-CM | POA: Diagnosis not present

## 2017-08-16 DIAGNOSIS — E1122 Type 2 diabetes mellitus with diabetic chronic kidney disease: Secondary | ICD-10-CM | POA: Diagnosis not present

## 2017-08-24 ENCOUNTER — Encounter (HOSPITAL_COMMUNITY): Payer: Self-pay | Admitting: Emergency Medicine

## 2017-08-24 ENCOUNTER — Other Ambulatory Visit: Payer: Self-pay

## 2017-08-24 ENCOUNTER — Inpatient Hospital Stay (HOSPITAL_COMMUNITY)
Admission: EM | Admit: 2017-08-24 | Discharge: 2017-08-27 | DRG: 378 | Disposition: A | Discharge status: Home / Self Care | Payer: Medicare Other | Attending: Internal Medicine | Admitting: Internal Medicine

## 2017-08-24 ENCOUNTER — Emergency Department (HOSPITAL_COMMUNITY): Payer: Medicare Other

## 2017-08-24 DIAGNOSIS — Q2730 Arteriovenous malformation, site unspecified: Secondary | ICD-10-CM

## 2017-08-24 DIAGNOSIS — I8501 Esophageal varices with bleeding: Secondary | ICD-10-CM | POA: Diagnosis present

## 2017-08-24 DIAGNOSIS — I509 Heart failure, unspecified: Secondary | ICD-10-CM | POA: Diagnosis not present

## 2017-08-24 DIAGNOSIS — Z950 Presence of cardiac pacemaker: Secondary | ICD-10-CM

## 2017-08-24 DIAGNOSIS — R42 Dizziness and giddiness: Secondary | ICD-10-CM | POA: Diagnosis not present

## 2017-08-24 DIAGNOSIS — K317 Polyp of stomach and duodenum: Secondary | ICD-10-CM | POA: Diagnosis present

## 2017-08-24 DIAGNOSIS — I130 Hypertensive heart and chronic kidney disease with heart failure and stage 1 through stage 4 chronic kidney disease, or unspecified chronic kidney disease: Secondary | ICD-10-CM | POA: Diagnosis present

## 2017-08-24 DIAGNOSIS — Z825 Family history of asthma and other chronic lower respiratory diseases: Secondary | ICD-10-CM

## 2017-08-24 DIAGNOSIS — E861 Hypovolemia: Secondary | ICD-10-CM | POA: Diagnosis present

## 2017-08-24 DIAGNOSIS — D509 Iron deficiency anemia, unspecified: Secondary | ICD-10-CM | POA: Diagnosis present

## 2017-08-24 DIAGNOSIS — N179 Acute kidney failure, unspecified: Secondary | ICD-10-CM | POA: Diagnosis present

## 2017-08-24 DIAGNOSIS — K228 Other specified diseases of esophagus: Secondary | ICD-10-CM | POA: Diagnosis not present

## 2017-08-24 DIAGNOSIS — Z66 Do not resuscitate: Secondary | ICD-10-CM | POA: Diagnosis present

## 2017-08-24 DIAGNOSIS — K766 Portal hypertension: Secondary | ICD-10-CM | POA: Diagnosis not present

## 2017-08-24 DIAGNOSIS — E871 Hypo-osmolality and hyponatremia: Secondary | ICD-10-CM | POA: Diagnosis not present

## 2017-08-24 DIAGNOSIS — Z6841 Body Mass Index (BMI) 40.0 and over, adult: Secondary | ICD-10-CM

## 2017-08-24 DIAGNOSIS — Z961 Presence of intraocular lens: Secondary | ICD-10-CM | POA: Diagnosis present

## 2017-08-24 DIAGNOSIS — G4700 Insomnia, unspecified: Secondary | ICD-10-CM | POA: Diagnosis present

## 2017-08-24 DIAGNOSIS — I248 Other forms of acute ischemic heart disease: Secondary | ICD-10-CM | POA: Diagnosis present

## 2017-08-24 DIAGNOSIS — G4733 Obstructive sleep apnea (adult) (pediatric): Secondary | ICD-10-CM | POA: Diagnosis present

## 2017-08-24 DIAGNOSIS — K922 Gastrointestinal hemorrhage, unspecified: Secondary | ICD-10-CM | POA: Diagnosis not present

## 2017-08-24 DIAGNOSIS — Z794 Long term (current) use of insulin: Secondary | ICD-10-CM

## 2017-08-24 DIAGNOSIS — E785 Hyperlipidemia, unspecified: Secondary | ICD-10-CM | POA: Diagnosis present

## 2017-08-24 DIAGNOSIS — Z951 Presence of aortocoronary bypass graft: Secondary | ICD-10-CM | POA: Diagnosis not present

## 2017-08-24 DIAGNOSIS — J449 Chronic obstructive pulmonary disease, unspecified: Secondary | ICD-10-CM | POA: Diagnosis present

## 2017-08-24 DIAGNOSIS — K7460 Unspecified cirrhosis of liver: Secondary | ICD-10-CM | POA: Diagnosis present

## 2017-08-24 DIAGNOSIS — I482 Chronic atrial fibrillation: Secondary | ICD-10-CM | POA: Diagnosis not present

## 2017-08-24 DIAGNOSIS — D62 Acute posthemorrhagic anemia: Secondary | ICD-10-CM | POA: Diagnosis not present

## 2017-08-24 DIAGNOSIS — Z7189 Other specified counseling: Secondary | ICD-10-CM

## 2017-08-24 DIAGNOSIS — I4821 Permanent atrial fibrillation: Secondary | ICD-10-CM

## 2017-08-24 DIAGNOSIS — R188 Other ascites: Secondary | ICD-10-CM

## 2017-08-24 DIAGNOSIS — I8500 Esophageal varices without bleeding: Secondary | ICD-10-CM | POA: Diagnosis not present

## 2017-08-24 DIAGNOSIS — Z955 Presence of coronary angioplasty implant and graft: Secondary | ICD-10-CM

## 2017-08-24 DIAGNOSIS — R5383 Other fatigue: Secondary | ICD-10-CM | POA: Diagnosis not present

## 2017-08-24 DIAGNOSIS — Z885 Allergy status to narcotic agent status: Secondary | ICD-10-CM

## 2017-08-24 DIAGNOSIS — N184 Chronic kidney disease, stage 4 (severe): Secondary | ICD-10-CM | POA: Diagnosis not present

## 2017-08-24 DIAGNOSIS — I2510 Atherosclerotic heart disease of native coronary artery without angina pectoris: Secondary | ICD-10-CM | POA: Diagnosis present

## 2017-08-24 DIAGNOSIS — I442 Atrioventricular block, complete: Secondary | ICD-10-CM | POA: Diagnosis present

## 2017-08-24 DIAGNOSIS — Z8249 Family history of ischemic heart disease and other diseases of the circulatory system: Secondary | ICD-10-CM

## 2017-08-24 DIAGNOSIS — Z9841 Cataract extraction status, right eye: Secondary | ICD-10-CM

## 2017-08-24 DIAGNOSIS — K3189 Other diseases of stomach and duodenum: Secondary | ICD-10-CM | POA: Diagnosis not present

## 2017-08-24 DIAGNOSIS — Z953 Presence of xenogenic heart valve: Secondary | ICD-10-CM

## 2017-08-24 DIAGNOSIS — Z515 Encounter for palliative care: Secondary | ICD-10-CM

## 2017-08-24 DIAGNOSIS — K219 Gastro-esophageal reflux disease without esophagitis: Secondary | ICD-10-CM | POA: Diagnosis present

## 2017-08-24 DIAGNOSIS — Z833 Family history of diabetes mellitus: Secondary | ICD-10-CM

## 2017-08-24 DIAGNOSIS — Z79899 Other long term (current) drug therapy: Secondary | ICD-10-CM

## 2017-08-24 DIAGNOSIS — Z888 Allergy status to other drugs, medicaments and biological substances status: Secondary | ICD-10-CM | POA: Diagnosis not present

## 2017-08-24 DIAGNOSIS — E1122 Type 2 diabetes mellitus with diabetic chronic kidney disease: Secondary | ICD-10-CM | POA: Diagnosis present

## 2017-08-24 DIAGNOSIS — K2970 Gastritis, unspecified, without bleeding: Secondary | ICD-10-CM | POA: Diagnosis present

## 2017-08-24 DIAGNOSIS — I350 Nonrheumatic aortic (valve) stenosis: Secondary | ICD-10-CM | POA: Diagnosis not present

## 2017-08-24 DIAGNOSIS — I5032 Chronic diastolic (congestive) heart failure: Secondary | ICD-10-CM

## 2017-08-24 DIAGNOSIS — F329 Major depressive disorder, single episode, unspecified: Secondary | ICD-10-CM | POA: Diagnosis present

## 2017-08-24 DIAGNOSIS — Z9842 Cataract extraction status, left eye: Secondary | ICD-10-CM

## 2017-08-24 DIAGNOSIS — D649 Anemia, unspecified: Secondary | ICD-10-CM | POA: Diagnosis not present

## 2017-08-24 DIAGNOSIS — R748 Abnormal levels of other serum enzymes: Secondary | ICD-10-CM | POA: Diagnosis not present

## 2017-08-24 DIAGNOSIS — Z87891 Personal history of nicotine dependence: Secondary | ICD-10-CM

## 2017-08-24 DIAGNOSIS — K31811 Angiodysplasia of stomach and duodenum with bleeding: Secondary | ICD-10-CM | POA: Diagnosis present

## 2017-08-24 DIAGNOSIS — E875 Hyperkalemia: Secondary | ICD-10-CM | POA: Diagnosis present

## 2017-08-24 DIAGNOSIS — R778 Other specified abnormalities of plasma proteins: Secondary | ICD-10-CM

## 2017-08-24 DIAGNOSIS — R7989 Other specified abnormal findings of blood chemistry: Secondary | ICD-10-CM

## 2017-08-24 HISTORY — DX: Dyspnea, unspecified: R06.00

## 2017-08-24 HISTORY — DX: Presence of cardiac pacemaker: Z95.0

## 2017-08-24 HISTORY — DX: Chronic kidney disease, unspecified: N18.9

## 2017-08-24 HISTORY — DX: Depression, unspecified: F32.A

## 2017-08-24 HISTORY — DX: Headache: R51

## 2017-08-24 HISTORY — DX: Headache, unspecified: R51.9

## 2017-08-24 HISTORY — DX: Major depressive disorder, single episode, unspecified: F32.9

## 2017-08-24 LAB — CBC WITH DIFFERENTIAL/PLATELET
BASOS ABS: 0.1 10*3/uL (ref 0.0–0.1)
BASOS PCT: 0 %
EOS ABS: 0.1 10*3/uL (ref 0.0–0.7)
EOS PCT: 1 %
HCT: 24.2 % — ABNORMAL LOW (ref 39.0–52.0)
Hemoglobin: 7 g/dL — ABNORMAL LOW (ref 13.0–17.0)
Lymphocytes Relative: 13 %
Lymphs Abs: 1.8 10*3/uL (ref 0.7–4.0)
MCH: 24.8 pg — ABNORMAL LOW (ref 26.0–34.0)
MCHC: 28.9 g/dL — ABNORMAL LOW (ref 30.0–36.0)
MCV: 85.8 fL (ref 78.0–100.0)
Monocytes Absolute: 0.3 10*3/uL (ref 0.1–1.0)
Monocytes Relative: 2 %
Neutro Abs: 12.4 10*3/uL (ref 1.7–7.7)
Neutrophils Relative %: 84 %
Platelets: 192 10*3/uL (ref 150–400)
RBC: 2.82 MIL/uL — AB (ref 4.22–5.81)
RDW: 19.2 % — ABNORMAL HIGH (ref 11.5–15.5)
WBC: 14.7 10*3/uL — ABNORMAL HIGH (ref 4.0–10.5)

## 2017-08-24 LAB — COMPREHENSIVE METABOLIC PANEL
ALT: 13 U/L — ABNORMAL LOW (ref 17–63)
ANION GAP: 16 — AB (ref 5–15)
AST: 23 U/L (ref 15–41)
Albumin: 3.2 g/dL — ABNORMAL LOW (ref 3.5–5.0)
Alkaline Phosphatase: 62 U/L (ref 38–126)
BUN: 216 mg/dL — ABNORMAL HIGH (ref 6–20)
CALCIUM: 8.8 mg/dL — AB (ref 8.9–10.3)
CHLORIDE: 89 mmol/L — AB (ref 101–111)
CO2: 17 mmol/L — AB (ref 22–32)
Creatinine, Ser: 3.52 mg/dL — ABNORMAL HIGH (ref 0.61–1.24)
GFR calc non Af Amer: 16 mL/min — ABNORMAL LOW (ref >60)
GFR, EST AFRICAN AMERICAN: 19 mL/min — AB (ref >60)
Glucose, Bld: 225 mg/dL — ABNORMAL HIGH (ref 65–99)
Potassium: 5.1 mmol/L (ref 3.5–5.1)
SODIUM: 122 mmol/L — AB (ref 135–145)
Total Bilirubin: 0.7 mg/dL (ref 0.3–1.2)
Total Protein: 6.7 g/dL (ref 6.5–8.1)

## 2017-08-24 LAB — GLUCOSE, CAPILLARY: Glucose-Capillary: 219 mg/dL — ABNORMAL HIGH (ref 65–99)

## 2017-08-24 LAB — MRSA PCR SCREENING: MRSA BY PCR: NEGATIVE

## 2017-08-24 LAB — PROTIME-INR
INR: 1.12
Prothrombin Time: 14.3 seconds (ref 11.4–15.2)

## 2017-08-24 LAB — TROPONIN I
TROPONIN I: 0.16 ng/mL — AB (ref <0.03)
TROPONIN I: 0.13 ng/mL — AB (ref <0.03)

## 2017-08-24 LAB — POC OCCULT BLOOD, ED: FECAL OCCULT BLD: POSITIVE — AB

## 2017-08-24 LAB — PREPARE RBC (CROSSMATCH)

## 2017-08-24 MED ORDER — SODIUM CHLORIDE 0.9 % IV SOLN: 10.0000 mL/h | Freq: Once | INTRAVENOUS | Status: DC

## 2017-08-24 MED ORDER — ATORVASTATIN CALCIUM 20 MG PO TABS
20.0000 mg | ORAL_TABLET | Freq: Every day | ORAL | Status: DC
  Administered 2017-08-25 – 2017-08-26 (×3): 20 mg via ORAL
  Filled 2017-08-24 (×2): qty 1

## 2017-08-24 MED ORDER — SODIUM CHLORIDE 0.9 % IV SOLN: Freq: Once | INTRAVENOUS | Status: DC

## 2017-08-24 MED ORDER — INSULIN ASPART 100 UNIT/ML ~~LOC~~ SOLN
0.0000 [IU] | Freq: Three times a day (TID) | SUBCUTANEOUS | Status: DC
  Administered 2017-08-25 – 2017-08-26 (×5): 2 [IU] via SUBCUTANEOUS
  Administered 2017-08-27: 3 [IU] via SUBCUTANEOUS
  Administered 2017-08-27: 11 [IU] via SUBCUTANEOUS

## 2017-08-24 MED ORDER — METOPROLOL SUCCINATE ER 25 MG PO TB24
25.0000 mg | ORAL_TABLET | Freq: Two times a day (BID) | ORAL | Status: DC
  Administered 2017-08-25 – 2017-08-27 (×5): 25 mg via ORAL
  Filled 2017-08-24 (×6): qty 1

## 2017-08-24 MED ORDER — VITAMIN C 500 MG PO TABS
500.0000 mg | ORAL_TABLET | Freq: Two times a day (BID) | ORAL | Status: DC
  Administered 2017-08-25 – 2017-08-27 (×6): 500 mg via ORAL
  Filled 2017-08-24 (×6): qty 1

## 2017-08-24 MED ORDER — GABAPENTIN 300 MG PO CAPS
300.0000 mg | ORAL_CAPSULE | Freq: Every day | ORAL | Status: DC
  Administered 2017-08-25 – 2017-08-26 (×4): 300 mg via ORAL
  Filled 2017-08-24 (×4): qty 1

## 2017-08-24 MED ORDER — OCTREOTIDE LOAD VIA INFUSION
50.0000 ug | Freq: Once | INTRAVENOUS | Status: DC
  Filled 2017-08-24: qty 25

## 2017-08-24 MED ORDER — METOLAZONE 5 MG PO TABS
5.0000 mg | ORAL_TABLET | ORAL | Status: DC
  Administered 2017-08-26: 5 mg via ORAL
  Filled 2017-08-24: qty 1

## 2017-08-24 MED ORDER — INSULIN ASPART 100 UNIT/ML ~~LOC~~ SOLN
0.0000 [IU] | Freq: Every day | SUBCUTANEOUS | Status: DC
  Administered 2017-08-25 – 2017-08-26 (×2): 2 [IU] via SUBCUTANEOUS

## 2017-08-24 MED ORDER — TAMSULOSIN HCL 0.4 MG PO CAPS
0.4000 mg | ORAL_CAPSULE | Freq: Every day | ORAL | Status: DC
  Administered 2017-08-25 – 2017-08-26 (×3): 0.4 mg via ORAL
  Filled 2017-08-24 (×2): qty 1

## 2017-08-24 MED ORDER — ONDANSETRON HCL 4 MG/2ML IJ SOLN: 4.0000 mg | Freq: Four times a day (QID) | INTRAMUSCULAR | Status: DC | PRN

## 2017-08-24 MED ORDER — SODIUM CHLORIDE 0.9 % IV SOLN
50.0000 ug/h | INTRAVENOUS | Status: DC
  Filled 2017-08-24 (×11): qty 1

## 2017-08-24 MED ORDER — ALLOPURINOL 100 MG PO TABS
100.0000 mg | ORAL_TABLET | Freq: Every day | ORAL | Status: DC
  Administered 2017-08-25 – 2017-08-27 (×3): 100 mg via ORAL
  Filled 2017-08-24 (×3): qty 1

## 2017-08-24 MED ORDER — PANTOPRAZOLE SODIUM 40 MG IV SOLR
40.0000 mg | Freq: Two times a day (BID) | INTRAVENOUS | Status: DC
  Administered 2017-08-25 – 2017-08-27 (×6): 40 mg via INTRAVENOUS
  Filled 2017-08-24 (×6): qty 40

## 2017-08-24 MED ORDER — ONDANSETRON HCL 4 MG PO TABS: 4.0000 mg | ORAL_TABLET | Freq: Four times a day (QID) | ORAL | Status: DC | PRN

## 2017-08-24 MED ORDER — ACETAMINOPHEN 650 MG RE SUPP: 650.0000 mg | Freq: Four times a day (QID) | RECTAL | Status: DC | PRN

## 2017-08-24 MED ORDER — TORSEMIDE 20 MG PO TABS
120.0000 mg | ORAL_TABLET | Freq: Two times a day (BID) | ORAL | Status: DC
  Administered 2017-08-25 – 2017-08-27 (×6): 120 mg via ORAL
  Filled 2017-08-24 (×4): qty 1
  Filled 2017-08-24 (×2): qty 6
  Filled 2017-08-24: qty 1

## 2017-08-24 MED ORDER — ACETAMINOPHEN 325 MG PO TABS: 650.0000 mg | ORAL_TABLET | Freq: Four times a day (QID) | ORAL | Status: DC | PRN

## 2017-08-24 MED ORDER — SERTRALINE HCL 50 MG PO TABS
50.0000 mg | ORAL_TABLET | Freq: Every day | ORAL | Status: DC
  Administered 2017-08-25 – 2017-08-27 (×4): 50 mg via ORAL
  Filled 2017-08-24 (×4): qty 1

## 2017-08-24 MED ORDER — FERROUS GLUCONATE 324 (38 FE) MG PO TABS
324.0000 mg | ORAL_TABLET | Freq: Three times a day (TID) | ORAL | Status: DC
  Administered 2017-08-25: 324 mg via ORAL
  Filled 2017-08-24 (×12): qty 1

## 2017-08-24 NOTE — H&P (Signed)
History and Physical  [name] MSX:[id number] DOB: 1946/05/21 DOA: 08/24/2017   PCP: [name], MD   Patient coming from: Home  Chief Complaint: generalized weakness, dyspnea on exertion  HPI:  [name] is a 72 y.o. 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  Patient denies any fevers, chills, sore throat, coughing, headache, neck pain, chest pain, nausea, vomiting, diarrhea, abdominal pain.  He last had paracentesis approximately 2 weeks prior to this admission on 08/10/2017.  The patient went to see his primary care provider on 08/24/2017.  Because of his dyspnea on exertion, he was advised to go to emergency department for further evaluation.  The patient denies any NSAID use, but states that he has been having melanotic stools for the last 2 weeks.  He denies any hematochezia or hematemesis or hematuria.  He endorses compliance with all his medications, but states that he has had dietary indiscretion with regard to salt intake.  He denies any worsening lower extremity edema or orthopnea.  However, he has noted increasing abdominal distention since his last paracentesis.  In the emergency department, the patient was noted to have soft systolic blood pressures in the 90s and low 100s.  Sodium was 122 with potassium 5.1.  LFTs were unremarkable.  WBC was 14.7 with hemoglobin 7.0.  2 units PRBCs were ordered. EKG showed AV paced rhythm.  Chest x-ray showed pulmonary interstitial markings.  Assessment/Plan: Acute blood loss anemia/symptomatic anemia/ upper GI bleed -Concerned about variceal bleed -Start Protonix IV twice daily -Start octreotide -Transfused 2 units PRBC -GI consult -Clear liquid diet  Acute on chronic renal failure--CKD4 -Likely  secondary to hypovolemia -monitor BMP  Elevated troponin/CAD -Likely secondary to demand ischemia in the setting of worsening renal function -No chest pain presently -Trend troponins -continue atorvastatin  Chronic diastolic CHF -2/33/6122 echo EF 60-65%, grade 2 DD, no WMA, mild MR -Daily weights -08/10/2017 weight 277 -Continue home dose torsemide and metolazone -Holding Aldactone secondary to increasing potassium -Holding potassium supplementation secondary to increasing potassium and mild hyperkalemia  Hyponatremia -This has been chronic in the setting of CHF and liver cirrhosis -Acute worsening secondary to volume depletion -Baseline sodium 128-131 -A.m. BMP  Chronic atrial fibrillation -Currently in a paced rhythm -Status post AVN ablation with PPM -CHADS-VASc 4, but not AC candidate due to GI bleeding -continue lower dose metoprolol succinate secondary to soft blood pressures  Liver cirrhosis -A combination of [place] versus cardiac related liver cirrhosis -Request ultrasound-guided paracentesis  Diabetes mellitus type 2 -NovoLog sliding scale for now in light of acute on chronic renal failure -Holding home dose NPH and monitor CBGs -check A1C     Past Medical History:  Diagnosis Date  . Aortic stenosis 03/08/2012   a.  s/p tissue AVR 10/13 with Dr. [name];   b. Echo 10/13: mod LVH, EF 55-60%, tissue AVR not well seen, no leak, gradient not too high (mean 100mHg), MAC, mild MR, mild LAE, PASP 38  . Ascites    status post paracentesis with removal of 3.4 L of ascitic fluid  . [place]    Permanent; off of Coumadin for now due to GI bleed  . AVM (arteriovenous malformation)    Recurrent GI bleeding requiring multiple transfusions  . CAD (coronary artery disease)  a. s/p CABG 2004;  b. [place] 10/13:  LHC 8/13: Free radial to obtuse marginal patent, SVG-diagonal patent, LIMA-LAD patent, EF 65-70%, mean aortic valve gradient 42  . Carotid bruit  06/14/2011   a. pre-AVR dopplers 10/13: no sig ICA stenosis  . Chronic diastolic heart failure ([place])   . Cirrhosis ([place])   . COPD (chronic obstructive pulmonary disease) ([place])   . DM2 (diabetes mellitus, type 2) ([facility])    at least 10 yrs  . GERD (gastroesophageal reflux disease)   . H/O hiatal hernia   . Hyperlipidemia   . Hypertension    x 15 yrs  . Insomnia   . Iron deficiency anemia    Requiring intravenous iron  . Mediastinal adenopathy 09/22/2011  . OSA (obstructive sleep apnea) 1999    USES CPAP  . Osteoarthritis   . Overweight(278.02)   . Thrombocytopenia ([place])    Past Surgical History:  Procedure Laterality Date  . ABLATION  04-18-14   AVN ablation by Dr Caryl Comes  . AORTIC VALVE REPLACEMENT  04/25/2012   Procedure: AORTIC VALVE REPLACEMENT (AVR);  Surgeon: Melrose Nakayama, MD;  Location: [place];  Service: Open Heart Surgery;  Laterality: N/A;  . AV NODE ABLATION N/A 04/18/2014   Procedure: AV NODE ABLATION;  Surgeon: Deboraha Sprang, MD;  Location: [facility] CATH LAB;  Service: Cardiovascular;  Laterality: N/A;  . CARPAL TUNNEL RELEASE    10/08/2003  . CATARACT EXTRACTION W/ INTRAOCULAR LENS IMPLANT Bilateral 09/28/2015 , 10/19/2015  . COLONOSCOPY WITH PROPOFOL N/A 10/28/2016   Procedure: COLONOSCOPY WITH PROPOFOL;  Surgeon: Rogene Houston, MD;  [facility];  Service: Endoscopy;  Laterality: N/A;  . CORONARY ANGIOPLASTY WITH STENT PLACEMENT  01/19/2005   drug eluting stent to high grade ostial stenosis of radial artery graft to OM  . CORONARY ARTERY BYPASS GRAFT   10/15/2002    [name]. [name], M.D.     . ESOPHAGOGASTRODUODENOSCOPY N/A 07/31/2013   Procedure: ESOPHAGOGASTRODUODENOSCOPY (EGD);  Surgeon: Milus Banister, MD;  Location: [place];  Service: Endoscopy;  Laterality: N/A;  . ESOPHAGOGASTRODUODENOSCOPY Left 01/11/2014   Procedure: ESOPHAGOGASTRODUODENOSCOPY (EGD);  Surgeon: Juanita Craver, MD;  Location[facility] ENDOSCOPY;  Service: Endoscopy;  Laterality: Left;   . ESOPHAGOGASTRODUODENOSCOPY (EGD) WITH PROPOFOL N/A 10/28/2016   Procedure: ESOPHAGOGASTRODUODENOSCOPY (EGD) WITH PROPOFOL;  Surgeon: Rogene Houston, MD;  [facility];  Service: Endoscopy;  Laterality: N/A;  . HERNIA REPAIR    . KNEE ARTHROSCOPY Right 01/20/2016   Procedure: ARTHROSCOPY RIGHT KNEE WITH MENSICAL DEBRIDEMENT;  Surgeon: Gaynelle Arabian, MD;  Location: [facility];  Service: Orthopedics;  Laterality: Right;  . LEFT AND RIGHT HEART CATHETERIZATION WITH CORONARY/GRAFT ANGIOGRAM N/A 03/07/2012   Procedure: LEFT AND RIGHT HEART CATHETERIZATION WITH Beatrix Fetters;  Surgeon: Sherren Mocha, MD;  Location: [facility] CATH LAB;  Service: Cardiovascular;  Laterality: N/A;  . lipoma surgery    . OTHER SURGICAL HISTORY  08/26/2011   [facility],  enteroscopy , revealing "three-four AVMs."   . PACEMAKER INSERTION  03-24-14   STJ Assurity single chamber pacemaker implanted by Dr Caryl Comes  . PARACENTESIS  12/2015  . PERMANENT PACEMAKER INSERTION N/A 03/24/2014   Procedure: PERMANENT PACEMAKER INSERTION;  Surgeon: Deboraha Sprang, MD;  Location: [facility] CATH LAB;  Service: Cardiovascular;  Laterality: N/A;  . POLYPECTOMY  10/28/2016   Procedure: POLYPECTOMY;  Surgeon: Rogene Houston, MD;  [facility];  Service: Endoscopy;;  gastric  . RIGHT HEART CATHETERIZATION N/A 01/01/2014   Procedure: RIGHT  HEART CATH;  Surgeon: Jolaine Artist, MD;  Location: [facility] CATH LAB;  Service: Cardiovascular;  Laterality: N/A;  . TEE WITHOUT CARDIOVERSION  03/07/2012   Procedure: TRANSESOPHAGEAL ECHOCARDIOGRAM (TEE);  Surgeon: Thayer Headings, MD;  Location: [place];  Service: Cardiovascular;  Laterality: N/A;   Social History:  reports that he quit smoking about 17 years ago. His smoking use included cigarettes. He has a 30.00 pack-year smoking history. he has never used smokeless tobacco. He reports that he does not drink alcohol or use drugs.   Family History  Problem Relation Age of Onset  .  Hypertension Father   . Diabetes Father   . Heart disease Father   . COPD Sister   . Cancer Maternal Aunt        Breast cancer   . Cancer Maternal Aunt        Breast cancer   . Diabetes Son   . Cancer Daughter        Cervical cancer  . Coronary artery disease Unknown        FAMILY HISTORY     Allergies  Allergen Reactions  . Codeine Other (See Comments) and Palpitations    Hurting in chest  . Diltiazem Hcl Itching  . Niacin Other (See Comments)    Felt a "severe burning sensation" Hot flashes  . Aspirin     History of Bleeding ulcers   . Diltiazem Hcl Other (See Comments) and Hives    Gets hot     Prior to Admission medications   Medication Sig Start Date End Date Taking? Authorizing Provider  acetaminophen (TYLENOL) 500 MG tablet Take 1,000 mg by mouth at bedtime as needed for mild pain or headache.    Yes [provider]  allopurinol (ZYLOPRIM) 100 MG tablet Take 1 tablet (100 mg total) by mouth daily. 04/18/17  Yes Larey Dresser, MD  atorvastatin (LIPITOR) 20 MG tablet Take 20 mg by mouth daily at 6 PM.    Yes [provider]  colchicine 0.6 MG tablet Take 0.6 mg by mouth daily.    Yes [provider]  ferrous gluconate (FERGON) 324 MG tablet Take 324 mg by mouth 3 (three) times daily with meals.    Yes [provider]  gabapentin (NEURONTIN) 300 MG capsule Take 300 mg by mouth 3 (three) times daily. 12/31/14  Yes [provider]  insulin NPH Human (HUMULIN N,NOVOLIN N) 100 UNIT/ML injection Inject 65 Units into the skin 2 (two) times daily before a meal.    Yes [provider]  insulin regular (NOVOLIN R,HUMULIN R) 100 units/mL injection Inject 30 Units into the skin 2 (two) times daily before a meal.    Yes [provider]  metolazone (ZAROXOLYN) 5 MG tablet Take 1 tablet (5 mg total) by mouth every 3 (three) days. 07/13/17  Yes Larey Dresser, MD  metoprolol succinate (TOPROL-XL) 100 MG 24 hr tablet Take  1 tablet (100 mg total) by mouth 2 (two) times daily. 05/17/17  Yes Larey Dresser, MD  Multiple Vitamins-Minerals ([employer] SPECTRAVITE ADULT 50+ PO) Take 1 tablet by mouth daily.   Yes [provider]  omeprazole (PRILOSEC) 20 MG capsule Take 1 capsule (20 mg total) by mouth 2 (two) times daily before a meal. 10/04/14  Yes Tanda Rockers, MD  potassium chloride SA (K-DUR,KLOR-CON) 20 MEQ tablet Take 2 tablets (40 mEq total) by mouth 2 (two) times daily. 07/13/17  Yes Larey Dresser, MD  [facility]  108 (90 Base) MCG/ACT inhaler Take 2 puffs by mouth 2 (two) times daily. 03/28/17  Yes [provider]  sertraline (ZOLOFT) 50 MG tablet Take 1 tablet (50 mg total) by mouth daily. 07/11/17  Yes Larey Dresser, MD  spironolactone (ALDACTONE) 50 MG tablet Take 1 tablet (50 mg total) by mouth 2 (two) times daily. 04/25/17  Yes Larey Dresser, MD  tamsulosin (FLOMAX) 0.4 MG CAPS capsule TAKE ONE CAPSULE BY MOUTH EVERY DAY AFTER SUPPER 07/11/17  Yes Larey Dresser, MD  torsemide (DEMADEX) 20 MG tablet Take 6 tablets (120 mg total) by mouth 2 (two) times daily. 06/29/17  Yes Larey Dresser, MD  vitamin C (ASCORBIC ACID) 500 MG tablet Take 500 mg by mouth 2 (two) times daily.   Yes [provider]    Review of Systems:  Constitutional:  No weight loss, night sweats, Fevers, chills Head&Eyes: No headache.  No vision loss.  No eye pain or scotoma ENT:  No Difficulty swallowing,Tooth/dental problems,Sore throat,  No ear ache, post nasal drip,  Cardio-vascular:  No chest pain, Orthopnea, PND, swelling in lower extremities,  dizziness, palpitations  GI:  No  abdominal pain, nausea, vomiting, diarrhea, loss of appetite, hematochezia, melena, heartburn, indigestion, Resp:  No cough. No coughing up of blood .No wheezing.No chest wall deformity  Skin:  no rash or lesions.  GU:  no dysuria, change in color of urine, no urgency or frequency. No flank pain.  Musculoskeletal:  No  joint pain or swelling. No decreased range of motion. No back pain.  Psych:  No change in mood or affect. No depression or anxiety. Neurologic: No headache, no dysesthesia, no focal weakness, no vision loss. No syncope  Physical Exam: Vitals:   08/24/17 1316 08/24/17 1325  BP:  108/60  Pulse:  75  Resp:  13  Temp:  97.8 F (36.6 C)  TempSrc:  Oral  SpO2:  99%  Weight: 126.1 kg (278 lb)   Height: _0  (1.727 m)    General:  A&O x 3, NAD, nontoxic, pleasant/cooperative Head/Eye: No conjunctival hemorrhage, no icterus, [place]/AT, No nystagmus ENT:  No icterus,  No thrush, good dentition, no pharyngeal exudate Neck:  No masses, no lymphadenpathy, no bruits CV:  RRR, no rub, no gallop, no S3 Lung: Fine bibasilar crackles but no wheezing Abdomen: soft/NT, +BS, mild distended, no peritoneal signs Ext: No cyanosis, No rashes, No petechiae, No lymphangitis, No edema Neuro: CNII-XII intact, strength 4/5 in bilateral upper and lower extremities, no dysmetria  Labs on Admission:  Basic Metabolic Panel: Recent Labs  Lab 08/24/17 1404  NA 122*  K 5.1  CL 89*  CO2 17*  GLUCOSE 225*  BUN 216*  CREATININE 3.52*  CALCIUM 8.8*   Liver Function Tests: Recent Labs  Lab 08/24/17 1404  AST 23  ALT 13*  ALKPHOS 62  BILITOT 0.7  PROT 6.7  ALBUMIN 3.2*   No results for input(s): LIPASE, AMYLASE in the last 168 hours. No results for input(s): AMMONIA in the last 168 hours. CBC: Recent Labs  Lab 08/24/17 1404  WBC 14.7*  NEUTROABS 12.4  HGB 7.0*  HCT 24.2*  MCV 85.8  PLT 192   Coagulation Profile: Recent Labs  Lab 08/24/17 1404  INR 1.12   Cardiac Enzymes: Recent Labs  Lab 08/24/17 1404  TROPONINI 0.16*   BNP: Invalid input(s): POCBNP CBG: No results for input(s): GLUCAP in the last 168 hours. Urine analysis:    Component Value Date/Time  COLORURINE YELLOW 04/17/2012 1430   APPEARANCEUR CLOUDY (A) 04/17/2012 1430   LABSPEC 1.016 04/17/2012 1430   PHURINE 6.0  04/17/2012 1430   GLUCOSEU >1000 (A) 04/17/2012 1430   HGBUR NEGATIVE 04/17/2012 1430   BILIRUBINUR NEGATIVE 04/17/2012 1430   KETONESUR NEGATIVE 04/17/2012 1430   PROTEINUR NEGATIVE 04/17/2012 1430   UROBILINOGEN 0.2 04/17/2012 1430   NITRITE NEGATIVE 04/17/2012 1430   LEUKOCYTESUR NEGATIVE 04/17/2012 1430   Sepsis Labs: _0 (procalcitonin:4,lacticidven:4) )No results found for this or any previous visit (from the past 240 hour(s)).   Radiological Exams on Admission: Dg Chest 2 View  Result Date: 08/24/2017 CLINICAL DATA:  Pt sent over for low hemoglobin 7.3. States dark tarry stool x1 week and generalized weakness,COPD, CHF EXAM: CHEST  2 VIEW COMPARISON:  06/01/2017 FINDINGS: Patient's left-sided transvenous pacemaker with leads to the right ventricle. Status post median sternotomy. The heart is enlarged. There is mild prominence of interstitial markings, stable in appearance. There are no focal consolidations. There is right costophrenic angle blunting. IMPRESSION: 1. Cardiomegaly. 2. Suspect mild interstitial pulmonary edema. Electronically Signed   By: Nolon Nations M.D.   On: 08/24/2017 14:12    EKG: Independently reviewed. Vpaced    Time spent:70 minutes Code Status:   DNR Family Communication:  No Family at bedside Disposition Plan: expect 3-4 day hospitalization Consults called: GI, cardiology DVT Prophylaxis: SCDs  Orson Eva, DO  [facility] Pager [phone]  If 7PM-7AM, please contact night-coverage [email or web address] Password [email or web address] 08/24/2017, 4:20 PM

## 2017-08-24 NOTE — ED Notes (Signed)
Date and time results received: 08/24/17 3:07 PM  (use smartphrase ".now" to insert current time)  Test: Troponin Critical Value: 0.16  Name of Provider Notified: Melina Copa  Orders Received? Or Actions Taken?: Orders Received - See Orders for details

## 2017-08-24 NOTE — ED Provider Notes (Signed)
[facility] EMERGENCY DEPARTMENT Provider Note   CSN: [id number] Arrival date & time: 08/24/17  1309     History   Chief Complaint Chief Complaint  Patient presents with  . Abnormal Lab    HPI [name] is a 72 y.o. male.  The history is provided by the patient.  Abnormal Lab  Patient referred by:  PCP Result type: hematology   Hematology:    Hematocrit:  Low   Hemoglobin:  Low   71 year old male with chronic A. fib not on anticoagulation now due to prior GI bleed, coronary disease CABG aortic valve replacement, history of AVMs with recurrent GI bleeds.  Patient is here after seeing his primary care doctor today and found to be anemic.  Patient states for the last couple of weeks he has been more fatigued and short of breath with exertion.  There is been no pain.  He states for about 1 week he has noticed black stools.  He has not vomited any blood.  He is intermittently felt nauseous.  He went to his primary care doctor as a routine visit today and found to have hemoglobin of 7.  Short of breath is with any exertion and improves with rest.  There is not been significant cough or phlegm.  He denies any fever.  There is been no syncope.   Past Medical History:  Diagnosis Date  . Aortic stenosis 03/08/2012   a.  s/p tissue AVR 10/13 with Dr. [name];   b. Echo 10/13: mod LVH, EF 55-60%, tissue AVR not well seen, no leak, gradient not too high (mean 39mHg), MAC, mild MR, mild LAE, PASP 38  . Ascites    status post paracentesis with removal of 3.4 L of ascitic fluid  . Atrial fibrillation ([place]    Permanent; off of Coumadin for now due to GI bleed  . AVM (arteriovenous malformation)    Recurrent GI bleeding requiring multiple transfusions  . CAD (coronary artery disease)    a. s/p CABG 2004;  b. [place]:  LHC 8/13: Free radial to obtuse marginal patent, SVG-diagonal patent, LIMA-LAD patent, EF 65-70%, mean aortic valve gradient 42  . Carotid bruit 06/14/2011   a.  pre-AVR dopplers 10/13: no sig ICA stenosis  . Chronic diastolic heart failure ([place]   . Cirrhosis ([place]   . COPD (chronic obstructive pulmonary disease) ([place]   . DM2 (diabetes mellitus, type 2) (HCC)    at least 10 yrs  . GERD (gastroesophageal reflux disease)   . H/O hiatal hernia   . Hyperlipidemia   . Hypertension    x 15 yrs  . Insomnia   . Iron deficiency anemia    Requiring intravenous iron  . Mediastinal adenopathy 09/22/2011  . OSA (obstructive sleep apnea) 1999    USES CPAP  . Osteoarthritis   . Overweight(278.02)   . Thrombocytopenia ([facility]     Patient Active Problem List   Diagnosis Date Noted  . Anemia due to chronic renal failure treated with erythropoietin, stage 3 (moderate) ([place] 04/25/2017  . Blood in stool 10/20/2016  . Polydipsia 11/11/2015  . OSA (obstructive sleep apnea) 09/23/2014  . S/P AV nodal ablation 04/18/14 04/19/2014  . Aortic stenosis, severe with S/P AVR  04/19/2014  . Atrioventricular block, complete ([place] 04/18/2014  . CKD (chronic kidney disease) 11/05/2013  . Unspecified gastritis and gastroduodenitis without mention of hemorrhage 07/31/2013  . Cough 06/28/2012  . Dyspnea 06/27/2012  . Permanent atrial fibrillation ([place] 03/02/2012  . History  of GI bleed 03/02/2012  . GERD (gastroesophageal reflux disease) 03/02/2012  . Type 2 diabetes mellitus ([place]) 03/02/2012  . Cirrhosis ([place])   . Mediastinal adenopathy 09/22/2011  . Osteoarthritis   . Thrombocytopenia ([place])   . Gynecomastia 09/20/2011  . Ascites 09/20/2011  . Iron deficiency anemia   . Hyperlipidemia   . Essential hypertension   . CHF (congestive heart failure) ([place])   . 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 Carotid bruit 06/14/2011  . ANEMIA 05/17/2010  . Overweight 05/02/2009  . CAD, ARTERY BYPASS GRAFT 05/02/2009  . 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    Past Surgical History:  Procedure Laterality Date  . ABLATION  04-18-14   AVN ablation by Dr Caryl Comes  . AORTIC  VALVE REPLACEMENT  04/25/2012   Procedure: AORTIC VALVE REPLACEMENT (AVR);  Surgeon: Melrose Nakayama, MD;  Location: [place];  Service: Open Heart Surgery;  Laterality: N/A;  . AV NODE ABLATION N/A 04/18/2014   Procedure: AV NODE ABLATION;  Surgeon: Deboraha Sprang, MD;  Location[facility] CATH LAB;  Service: Cardiovascular;  Laterality: N/A;  . CARPAL TUNNEL RELEASE    10/08/2003  . CATARACT EXTRACTION W/ INTRAOCULAR LENS IMPLANT Bilateral 09/28/2015 , 10/19/2015  . COLONOSCOPY WITH PROPOFOL N/A 10/28/2016   Procedure: COLONOSCOPY WITH PROPOFOL;  Surgeon: Rogene Houston, MD;  [facility];  Service: Endoscopy;  Laterality: N/A;  . CORONARY ANGIOPLASTY WITH STENT PLACEMENT  01/19/2005   drug eluting stent to high grade ostial stenosis of radial artery graft to OM  . CORONARY ARTERY BYPASS GRAFT   10/15/2002    [name]. [name], M.D.     . ESOPHAGOGASTRODUODENOSCOPY N/A 07/31/2013   Procedure: ESOPHAGOGASTRODUODENOSCOPY (EGD);  Surgeon: Milus Banister, MD;  Location: [place];  Service: Endoscopy;  Laterality: N/A;  . ESOPHAGOGASTRODUODENOSCOPY Left 01/11/2014   Procedure: ESOPHAGOGASTRODUODENOSCOPY (EGD);  Surgeon: Juanita Craver, MD;  Location: [facility] ENDOSCOPY;  Service: Endoscopy;  Laterality: Left;  . ESOPHAGOGASTRODUODENOSCOPY (EGD) WITH PROPOFOL N/A 10/28/2016   Procedure: ESOPHAGOGASTRODUODENOSCOPY (EGD) WITH PROPOFOL;  Surgeon: Rogene Houston, MD;  [facility];  Service: Endoscopy;  Laterality: N/A;  . HERNIA REPAIR    . KNEE ARTHROSCOPY Right 01/20/2016   Procedure: ARTHROSCOPY RIGHT KNEE WITH MENSICAL DEBRIDEMENT;  Surgeon: Gaynelle Arabian, MD;  Location: [facility];  Service: Orthopedics;  Laterality: Right;  . LEFT AND RIGHT HEART CATHETERIZATION WITH CORONARY/GRAFT ANGIOGRAM N/A 03/07/2012   Procedure: LEFT AND RIGHT HEART CATHETERIZATION WITH Beatrix Fetters;  Surgeon: Sherren Mocha, MD;  Location: [facility] CATH LAB;  Service: Cardiovascular;  Laterality: N/A;  . lipoma  surgery    . OTHER SURGICAL HISTORY  08/26/2011   [facility],  enteroscopy , revealing "three-four AVMs."   . PACEMAKER INSERTION  03-24-14   STJ Assurity single chamber pacemaker implanted by Dr Caryl Comes  . PARACENTESIS  12/2015  . PERMANENT PACEMAKER INSERTION N/A 03/24/2014   Procedure: PERMANENT PACEMAKER INSERTION;  Surgeon: Deboraha Sprang, MD;  Location: [facility] CATH LAB;  Service: Cardiovascular;  Laterality: N/A;  . POLYPECTOMY  10/28/2016   Procedure: POLYPECTOMY;  Surgeon: Rogene Houston, MD;  [facility];  Service: Endoscopy;;  gastric  . RIGHT HEART CATHETERIZATION N/A 01/01/2014   Procedure: RIGHT HEART CATH;  Surgeon: Jolaine Artist, MD;  Location: [facility] CATH LAB;  Service: Cardiovascular;  Laterality: N/A;  . TEE WITHOUT CARDIOVERSION  03/07/2012   Procedure: TRANSESOPHAGEAL ECHOCARDIOGRAM (TEE);  Surgeon: Thayer Headings, MD;  Location: [place];  Service: Cardiovascular;  Laterality:  N/A;       Home Medications    Prior to Admission medications   Medication Sig Start Date End Date Taking? Authorizing Provider  acetaminophen (TYLENOL) 500 MG tablet Take 1,000 mg by mouth at bedtime as needed for mild pain or headache.     [provider]  allopurinol (ZYLOPRIM) 100 MG tablet Take 1 tablet (100 mg total) by mouth daily. 04/18/17   Larey Dresser, MD  atorvastatin (LIPITOR) 20 MG tablet Take 20 mg by mouth daily at 6 PM.     [provider]  colchicine 0.6 MG tablet Take 0.6 mg by mouth daily.     [provider]  ferrous gluconate (FERGON) 324 MG tablet Take 324 mg by mouth 3 (three) times daily with meals.     [provider]  gabapentin (NEURONTIN) 300 MG capsule Take 300 mg by mouth 3 (three) times daily. 12/31/14   [provider]  insulin NPH Human (HUMULIN N,NOVOLIN N) 100 UNIT/ML injection Inject 65 Units into the skin 2 (two) times daily before a meal.     [provider]  insulin regular (NOVOLIN R,HUMULIN R) 100  units/mL injection Inject 30 Units into the skin 2 (two) times daily before a meal.     [provider]  lisinopril (PRINIVIL,ZESTRIL) 10 MG tablet Take 1 tablet by mouth daily.    [provider]  methocarbamol (ROBAXIN) 500 MG tablet Take 1 tablet (500 mg total) by mouth 4 (four) times daily. As needed for muscle spasm 01/20/16   Gaynelle Arabian, MD  metolazone (ZAROXOLYN) 5 MG tablet Take 1 tablet (5 mg total) by mouth every 3 (three) days. 07/13/17   Larey Dresser, MD  metoprolol succinate (TOPROL-XL) 100 MG 24 hr tablet Take 1 tablet (100 mg total) by mouth 2 (two) times daily. 05/17/17   Larey Dresser, MD  Multiple Vitamins-Minerals ([employer] SPECTRAVITE ADULT 50+ PO) Take 1 tablet by mouth daily.    [provider]  omeprazole (PRILOSEC) 20 MG capsule Take 1 capsule (20 mg total) by mouth 2 (two) times daily before a meal. 10/04/14   Tanda Rockers, MD  potassium chloride SA (K-DUR,KLOR-CON) 20 MEQ tablet Take 2 tablets (40 mEq total) by mouth 2 (two) times daily. 07/13/17   Larey Dresser, MD  PROAIR HFA 108 [phone] Base) MCG/ACT inhaler Take 2 puffs by mouth 2 (two) times daily. 03/28/17   [provider]  sertraline (ZOLOFT) 50 MG tablet Take 1 tablet (50 mg total) by mouth daily. 07/11/17   Larey Dresser, MD  spironolactone (ALDACTONE) 50 MG tablet Take 1 tablet (50 mg total) by mouth 2 (two) times daily. 04/25/17   Larey Dresser, MD  tamsulosin (FLOMAX) 0.4 MG CAPS capsule TAKE ONE CAPSULE BY MOUTH EVERY DAY AFTER SUPPER 07/11/17   Larey Dresser, MD  torsemide (DEMADEX) 20 MG tablet Take 6 tablets (120 mg total) by mouth 2 (two) times daily. 06/29/17   Larey Dresser, MD  vitamin C (ASCORBIC ACID) 500 MG tablet Take 500 mg by mouth 2 (two) times daily.    [provider]    Family History Family History  Problem Relation Age of Onset  . Hypertension Father   . Diabetes Father   . Heart disease Father   . COPD Sister   . Cancer  Maternal Aunt        Breast cancer   . Cancer Maternal Aunt  Breast cancer   . Diabetes Son   . Cancer Daughter        Cervical cancer  . Coronary artery disease Unknown        FAMILY HISTORY    Social History Social History   Tobacco Use  . Smoking status: Former Smoker    Packs/day: 1.00    Years: 30.00    Pack years: 30.00    Types: Cigarettes    Last attempt to quit: 07/25/2000    Years since quitting: 17.0  . Smokeless tobacco: Never Used  . Tobacco comment: Quit smoking in 2002  Substance Use Topics  . Alcohol use: No    Alcohol/week: 0.6 oz    Types: 1 Shots of liquor per week    Comment: stopped drinking alcohol  . Drug use: No     Allergies   Codeine; Diltiazem hcl; Niacin; Aspirin; and Diltiazem hcl   Review of Systems Review of Systems  Constitutional: Positive for fatigue. Negative for chills and fever.  HENT: Negative for ear pain, nosebleeds and sore throat.   Eyes: Negative for pain and visual disturbance.  Respiratory: Positive for shortness of breath. Negative for cough.   Cardiovascular: Negative for chest pain and palpitations.  Gastrointestinal: Positive for nausea and vomiting. Negative for abdominal pain.  Genitourinary: Negative for dysuria, frequency and hematuria.  Musculoskeletal: Negative for arthralgias, back pain and neck pain.  Skin: Negative for color change and rash.  Neurological: Positive for dizziness and weakness. Negative for seizures, syncope and headaches.  All other systems reviewed and are negative.    Physical Exam Updated Vital Signs BP 108/60 (BP Location: Left Arm)   Pulse 75   Temp 97.8 F (36.6 C) (Oral)   Resp 13   Ht 5' 8"  (1.727 m)   Wt 126.1 kg (278 lb)   SpO2 99%   BMI 42.27 kg/m   Physical Exam  Constitutional: He appears well-developed and well-nourished.  HENT:  Head: Normocephalic and atraumatic.  Eyes: Conjunctivae are normal.  Neck: Neck supple.  Cardiovascular: Normal rate and  regular rhythm.  Murmur heard.  Systolic murmur is present. Pulmonary/Chest: Effort normal and breath sounds normal. No respiratory distress.  Abdominal: Soft. He exhibits distension and fluid wave. There is no tenderness.  Genitourinary: Rectal exam shows guaiac positive stool. Rectal exam shows no mass and no tenderness.  Genitourinary Comments: Chaperone was present during exam. Nurse Gerald Stabs   Musculoskeletal: He exhibits no edema or tenderness.  Neurological: He is alert.  Skin: Skin is warm and dry.  Psychiatric: He has a normal mood and affect.  Nursing note and vitals reviewed.    ED Treatments / Results  Labs (all labs ordered are listed, but only abnormal results are displayed) Labs Reviewed  CBC WITH DIFFERENTIAL/PLATELET - Abnormal; Notable for the following components:      Result Value   WBC 14.7 (*)    RBC 2.82 (*)    Hemoglobin 7.0 (*)    HCT 24.2 (*)    MCH 24.8 (*)    MCHC 28.9 (*)    RDW 19.2 (*)    All other components within normal limits  TROPONIN I - Abnormal; Notable for the following components:   Troponin I 0.16 (*)    All other components within normal limits  COMPREHENSIVE METABOLIC PANEL - Abnormal; Notable for the following components:   Sodium 122 (*)    Chloride 89 (*)    CO2 17 (*)    Glucose, Bld 225 (*)  BUN 216 (*)    Creatinine, Ser 3.52 (*)    Calcium 8.8 (*)    Albumin 3.2 (*)    ALT 13 (*)    GFR calc non Af Amer 16 (*)    GFR calc Af Amer 19 (*)    Anion gap 16 (*)    All other components within normal limits  POC OCCULT BLOOD, ED - Abnormal; Notable for the following components:   Fecal Occult Bld POSITIVE (*)    All other components within normal limits  MRSA PCR SCREENING  URINE CULTURE  PROTIME-INR  BASIC METABOLIC PANEL  HEMOGLOBIN A1C  TROPONIN I  TROPONIN I  URINALYSIS, COMPLETE (UACMP) WITH MICROSCOPIC  CBC  TYPE AND SCREEN  PREPARE RBC (CROSSMATCH)  PREPARE RBC (CROSSMATCH)    EKG  EKG  Interpretation  Date/Time:  Thursday August 24 2017 13:29:08 EST Ventricular Rate:  75 PR Interval:    QRS Duration: 191 QT Interval:  489 QTC Calculation: 547 R Axis:   -77 Text Interpretation:  Ventricular-paced rhythm No further analysis attempted due to paced rhythm similar to prior Confirmed by Aletta Edouard [id number]) on 08/24/2017 1:38:00 PM       Radiology Dg Chest 2 View  Result Date: 08/24/2017 CLINICAL DATA:  Pt sent over for low hemoglobin 7.3. States dark tarry stool x1 week and generalized weakness,COPD, CHF EXAM: CHEST  2 VIEW COMPARISON:  06/01/2017 FINDINGS: Patient's left-sided transvenous pacemaker with leads to the right ventricle. Status post median sternotomy. The heart is enlarged. There is mild prominence of interstitial markings, stable in appearance. There are no focal consolidations. There is right costophrenic angle blunting. IMPRESSION: 1. Cardiomegaly. 2. Suspect mild interstitial pulmonary edema. Electronically Signed   By: Nolon Nations M.D.   On: 08/24/2017 14:12    Procedures Procedures (including critical care time)  Medications Ordered in ED Medications  0.9 %  sodium chloride infusion (not administered)  0.9 %  sodium chloride infusion (not administered)  allopurinol (ZYLOPRIM) tablet 100 mg (100 mg Oral Not Given 08/24/17 1921)  atorvastatin (LIPITOR) tablet 20 mg (not administered)  ferrous gluconate (FERGON) tablet 324 mg (not administered)  gabapentin (NEURONTIN) capsule 300 mg (not administered)  metolazone (ZAROXOLYN) tablet 5 mg (not administered)  metoprolol succinate (TOPROL-XL) 24 hr tablet 25 mg (not administered)  sertraline (ZOLOFT) tablet 50 mg (not administered)  tamsulosin (FLOMAX) capsule 0.4 mg (not administered)  torsemide (DEMADEX) tablet 120 mg (120 mg Oral Not Given 08/24/17 1921)  vitamin C (ASCORBIC ACID) tablet 500 mg (not administered)  acetaminophen (TYLENOL) tablet 650 mg (not administered)    Or  acetaminophen  (TYLENOL) suppository 650 mg (not administered)  ondansetron (ZOFRAN) tablet 4 mg (not administered)    Or  ondansetron (ZOFRAN) injection 4 mg (not administered)  octreotide (SANDOSTATIN) 2 mcg/mL load via infusion 50 mcg (50 mcg Intravenous Not Given 08/24/17 1921)    And  octreotide (SANDOSTATIN) 500 mcg in sodium chloride 0.9 % 250 mL (2 mcg/mL) infusion (50 mcg/hr Intravenous Not Given 08/24/17 1921)  pantoprazole (PROTONIX) injection 40 mg (not administered)  insulin aspart (novoLOG) injection 0-15 Units (not administered)  insulin aspart (novoLOG) injection 0-5 Units (not administered)  0.9 %  sodium chloride infusion (not administered)     Initial Impression / Assessment and Plan / ED Course  I have reviewed the triage vital signs and the nursing notes.  Pertinent labs & imaging results that were available during my care of the patient were reviewed by me and considered in  my medical decision making (see chart for details).  Clinical Course as of Aug 24 1538  Thu Aug 24, 2017  1536 Discussed with hospitalist Dr. Carles Collet who accepts the patient for admission.  [MB]    Clinical Course User Index [MB] Hayden Rasmussen, MD   CRITICAL CARE Performed by: Hayden Rasmussen   Total critical care time: 30 minutes  Critical care time was exclusive of separately billable procedures and treating other patients.  Critical care was necessary to treat or prevent imminent or life-threatening deterioration.  Critical care was time spent personally by me on the following activities: development of treatment plan with patient and/or surrogate as well as nursing, discussions with consultants, evaluation of patient's response to treatment, examination of patient, obtaining history from patient or surrogate, ordering and performing treatments and interventions, ordering and review of laboratory studies, ordering and review of radiographic studies, pulse oximetry and re-evaluation of patient's  condition.  Anemia/GIB requiring transfusion, probable demand ischemia with trop leak. Hyponatremia.   Final Clinical Impressions(s) / ED Diagnoses   Final diagnoses:  Gastrointestinal hemorrhage, unspecified gastrointestinal hemorrhage type  Elevated troponin  Hyponatremia    ED Discharge Orders    None       Hayden Rasmussen, MD 08/24/17 1942

## 2017-08-24 NOTE — ED Triage Notes (Signed)
Pt sent over for low hgb 7.3. States dark tarry stool x1 week and generalized weakness, denies blood thinner use. Hx of blood transfusions before.

## 2017-08-24 NOTE — Progress Notes (Signed)
Spoke with Dr. Carles Collet regarding Blood transfusions and BP. A total of 2 units of blood not 4 is to be administered when blood is ready. BP has been soft and no fluid boluses are to be administered since patient will be receiving blood so as to not fluid overload him. Per Dr. Doristine Devoid verbal consent, okay if BP is in low 80's.

## 2017-08-24 NOTE — Progress Notes (Signed)
Report received from Nicola Girt, RN

## 2017-08-25 ENCOUNTER — Encounter (HOSPITAL_COMMUNITY): Payer: Self-pay | Admitting: Primary Care

## 2017-08-25 ENCOUNTER — Inpatient Hospital Stay (HOSPITAL_COMMUNITY): Payer: Medicare Other | Admitting: Anesthesiology

## 2017-08-25 ENCOUNTER — Encounter (HOSPITAL_COMMUNITY)
Admission: EM | Disposition: A | Discharge status: Home / Self Care | Payer: Self-pay | Source: Home / Self Care | Attending: Internal Medicine

## 2017-08-25 ENCOUNTER — Inpatient Hospital Stay (HOSPITAL_COMMUNITY): Payer: Medicare Other

## 2017-08-25 DIAGNOSIS — I8500 Esophageal varices without bleeding: Secondary | ICD-10-CM

## 2017-08-25 DIAGNOSIS — Z515 Encounter for palliative care: Secondary | ICD-10-CM

## 2017-08-25 DIAGNOSIS — R748 Abnormal levels of other serum enzymes: Secondary | ICD-10-CM

## 2017-08-25 DIAGNOSIS — K3189 Other diseases of stomach and duodenum: Secondary | ICD-10-CM

## 2017-08-25 DIAGNOSIS — I350 Nonrheumatic aortic (valve) stenosis: Secondary | ICD-10-CM

## 2017-08-25 DIAGNOSIS — K922 Gastrointestinal hemorrhage, unspecified: Secondary | ICD-10-CM

## 2017-08-25 DIAGNOSIS — D62 Acute posthemorrhagic anemia: Secondary | ICD-10-CM

## 2017-08-25 DIAGNOSIS — R778 Other specified abnormalities of plasma proteins: Secondary | ICD-10-CM

## 2017-08-25 DIAGNOSIS — R7989 Other specified abnormal findings of blood chemistry: Secondary | ICD-10-CM

## 2017-08-25 DIAGNOSIS — K766 Portal hypertension: Secondary | ICD-10-CM

## 2017-08-25 DIAGNOSIS — K228 Other specified diseases of esophagus: Secondary | ICD-10-CM

## 2017-08-25 DIAGNOSIS — K317 Polyp of stomach and duodenum: Secondary | ICD-10-CM

## 2017-08-25 DIAGNOSIS — Z7189 Other specified counseling: Secondary | ICD-10-CM

## 2017-08-25 HISTORY — PX: ESOPHAGOGASTRODUODENOSCOPY (EGD) WITH PROPOFOL: SHX5813

## 2017-08-25 LAB — BASIC METABOLIC PANEL
Anion gap: 15 (ref 5–15)
BUN: 110 mg/dL — AB (ref 6–20)
CALCIUM: 8.8 mg/dL — AB (ref 8.9–10.3)
CO2: 19 mmol/L — AB (ref 22–32)
Chloride: 89 mmol/L — ABNORMAL LOW (ref 101–111)
Creatinine, Ser: 3.17 mg/dL — ABNORMAL HIGH (ref 0.61–1.24)
GFR calc Af Amer: 21 mL/min — ABNORMAL LOW (ref >60)
GFR, EST NON AFRICAN AMERICAN: 19 mL/min — AB (ref >60)
GLUCOSE: 170 mg/dL — AB (ref 65–99)
Potassium: 4.4 mmol/L (ref 3.5–5.1)
Sodium: 123 mmol/L — ABNORMAL LOW (ref 135–145)

## 2017-08-25 LAB — CBC
HEMATOCRIT: 28 % — AB (ref 39.0–52.0)
Hemoglobin: 8.5 g/dL — ABNORMAL LOW (ref 13.0–17.0)
MCH: 25.6 pg — AB (ref 26.0–34.0)
MCHC: 30.4 g/dL (ref 30.0–36.0)
MCV: 84.3 fL (ref 78.0–100.0)
Platelets: 172 10*3/uL (ref 150–400)
RBC: 3.32 MIL/uL — ABNORMAL LOW (ref 4.22–5.81)
RDW: 18.5 % — AB (ref 11.5–15.5)
WBC: 11.3 10*3/uL — ABNORMAL HIGH (ref 4.0–10.5)

## 2017-08-25 LAB — GLUCOSE, CAPILLARY
GLUCOSE-CAPILLARY: 160 mg/dL — AB (ref 65–99)
GLUCOSE-CAPILLARY: 146 mg/dL — AB (ref 65–99)
GLUCOSE-CAPILLARY: 125 mg/dL — AB (ref 65–99)
Glucose-Capillary: 139 mg/dL — ABNORMAL HIGH (ref 65–99)
Glucose-Capillary: 153 mg/dL — ABNORMAL HIGH (ref 65–99)
Glucose-Capillary: 138 mg/dL — ABNORMAL HIGH (ref 65–99)
Glucose-Capillary: 124 mg/dL — ABNORMAL HIGH (ref 65–99)

## 2017-08-25 LAB — TROPONIN I
Troponin I: 0.14 ng/mL (ref <0.03)
Troponin I: 0.13 ng/mL (ref <0.03)

## 2017-08-25 LAB — URINALYSIS, COMPLETE (UACMP) WITH MICROSCOPIC
BACTERIA UA: NONE SEEN
BILIRUBIN URINE: NEGATIVE
Glucose, UA: NEGATIVE mg/dL
HGB URINE DIPSTICK: NEGATIVE
KETONES UR: NEGATIVE mg/dL
Leukocytes, UA: NEGATIVE
Nitrite: NEGATIVE
Protein, ur: NEGATIVE mg/dL
Specific Gravity, Urine: 1.009 (ref 1.005–1.030)
pH: 5 (ref 5.0–8.0)

## 2017-08-25 LAB — HEMOGLOBIN A1C
HEMOGLOBIN A1C: 5 % (ref 4.8–5.6)
MEAN PLASMA GLUCOSE: 96.8 mg/dL

## 2017-08-25 LAB — GRAM STAIN

## 2017-08-25 LAB — BODY FLUID CELL COUNT WITH DIFFERENTIAL
Eos, Fluid: 0 %
Lymphs, Fluid: 10 %
Monocyte-Macrophage-Serous Fluid: 31 % — ABNORMAL LOW (ref 50–90)
Neutrophil Count, Fluid: 59 % — ABNORMAL HIGH (ref 0–25)
Total Nucleated Cell Count, Fluid: 459 cu mm (ref 0–1000)

## 2017-08-25 LAB — GLUCOSE, PLEURAL OR PERITONEAL FLUID: Glucose, Fluid: 163 mg/dL

## 2017-08-25 LAB — PROTEIN, PLEURAL OR PERITONEAL FLUID: Total protein, fluid: 4.2 g/dL

## 2017-08-25 SURGERY — ESOPHAGOGASTRODUODENOSCOPY (EGD) WITH PROPOFOL
Anesthesia: Monitor Anesthesia Care

## 2017-08-25 MED ORDER — LIDOCAINE VISCOUS 2 % MT SOLN
OROMUCOSAL | Status: DC | PRN
  Administered 2017-08-25: 1 via OROMUCOSAL

## 2017-08-25 MED ORDER — OCTREOTIDE LOAD VIA INFUSION
50.0000 ug | Freq: Once | INTRAVENOUS | Status: AC
Start: 2017-08-25 — End: 2017-08-25
  Administered 2017-08-25: 50 ug via INTRAVENOUS
  Filled 2017-08-25: qty 25

## 2017-08-25 MED ORDER — LACTATED RINGERS IV SOLN: INTRAVENOUS | Status: DC

## 2017-08-25 MED ORDER — MIDAZOLAM HCL 2 MG/2ML IJ SOLN
INTRAMUSCULAR | Status: AC
  Filled 2017-08-25: qty 2

## 2017-08-25 MED ORDER — MIDAZOLAM HCL 2 MG/2ML IJ SOLN
1.0000 mg | INTRAMUSCULAR | Status: AC
  Administered 2017-08-25: 2 mg via INTRAVENOUS

## 2017-08-25 MED ORDER — DEXTROSE 5 % IV SOLN
1.0000 g | INTRAVENOUS | Status: DC
  Administered 2017-08-25 – 2017-08-27 (×3): 1 g via INTRAVENOUS
  Filled 2017-08-25 (×6): qty 10

## 2017-08-25 MED ORDER — ALBUMIN HUMAN 25 % IV SOLN
50.0000 g | Freq: Once | INTRAVENOUS | Status: AC
Start: 2017-08-25 — End: 2017-08-25
  Administered 2017-08-25: 50 g via INTRAVENOUS
  Filled 2017-08-25: qty 200

## 2017-08-25 MED ORDER — PROPOFOL 500 MG/50ML IV EMUL
INTRAVENOUS | Status: DC | PRN
  Administered 2017-08-25: 125 ug/kg/min via INTRAVENOUS

## 2017-08-25 MED ORDER — SODIUM CHLORIDE 0.9 % IV SOLN
50.0000 ug/h | INTRAVENOUS | Status: DC
  Administered 2017-08-25: 50 ug/h via INTRAVENOUS
  Filled 2017-08-25 (×9): qty 1

## 2017-08-25 MED ORDER — FENTANYL CITRATE (PF) 100 MCG/2ML IJ SOLN
25.0000 ug | Freq: Once | INTRAMUSCULAR | Status: AC
  Administered 2017-08-25: 25 ug via INTRAVENOUS

## 2017-08-25 MED ORDER — SODIUM CHLORIDE 0.9 % IV SOLN
INTRAVENOUS | Status: DC
  Administered 2017-08-25: 12:00:00 via INTRAVENOUS

## 2017-08-25 MED ORDER — PROPOFOL 10 MG/ML IV BOLUS
INTRAVENOUS | Status: AC
  Filled 2017-08-25: qty 60

## 2017-08-25 MED ORDER — FENTANYL CITRATE (PF) 100 MCG/2ML IJ SOLN
INTRAMUSCULAR | Status: AC
  Filled 2017-08-25: qty 2

## 2017-08-25 MED ORDER — LIDOCAINE VISCOUS 2 % MT SOLN
OROMUCOSAL | Status: AC
  Filled 2017-08-25: qty 30

## 2017-08-25 MED ORDER — ALUM & MAG HYDROXIDE-SIMETH 200-200-20 MG/5ML PO SUSP
30.0000 mL | ORAL | Status: DC | PRN
  Administered 2017-08-25: 30 mL via ORAL
  Filled 2017-08-25: qty 30

## 2017-08-25 NOTE — Progress Notes (Signed)
Insulin not given at scheduled time 1200 since he would not have his lunch due to EGD scheduled for that time. BG 146 upon return to ICU 09. Will administer insulin at this time a long with other scheduled meds due at 1200

## 2017-08-25 NOTE — OR Nursing (Signed)
1310- placed on PACU hold. Waiting for ICU, RN to call back to receive report.

## 2017-08-25 NOTE — Consult Note (Addendum)
Referring Provider: Orson Eva, MD Primary Care Physician:  [name], MD Primary Gastroenterologist:  Dr. Laural Golden  Reason for Consultation:    GI bleed and anemia in a patient with chronic liver disease.  HPI:   Patient is 72 year old Caucasian male with multiple medical problems who also has cirrhosis secondary to NASH complicated by ascites requiring frequent abdominal paracenteses for symptomatic relief.  He also has history of recurrent GI bleed and iron deficiency anemia.  He had EGD and colonoscopy in April 2016. EGD revealed 2 columns of grade 1 esophageal varices portal gastropathy and he had 3 small polyps with ulcerated surface covered with blood.  These were snared and turned out to be hyperplastic polyps.  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 Colonoscopy revealed lipoma with hepatic flexure and external hemorrhoids. Patient states he has been feeling very tired and weak for the last 2-3 weeks.  Yesterday he had an appointment to see Dr. Doyle Askew.  As he walked from front of house in order to get in the car he could not walk.  He had to sit down.  He was assisted by his son and wife and taken to Dr. Doyle Askew office.  Patient's hemoglobin was low.  He was therefore advised to come to emergency room.  On arrival his hemoglobin was 7.0 g.  Stool was noted to be black and heme positive.  He was admitted to ICU.  He has received 2 units of PRBCs and hemoglobin from this morning was 8.5 g. Patient states his stools have been black since he has been on p.o. iron.  He did not notice recent change such as order or the stool was more sticky.  Last bowel movement was 2 days ago and was formed.  He denies abdominal pain.  He has noted gradual abdominal distention since her last abdominal tap weeks ago when he had 3.6 L of fluid removed.  He had 2 episodes of nausea with heaving within the last 1 week.  He has had good appetite.  He denies heartburn or dysphagia.  He also denies bright red blood per  rectum. Patient does not take OTC NSAIDs. He reports exertional dyspnea on walking inside the house. According to his daughter Alyse Low he has not been confused.   Past Medical History:  Diagnosis Date  . Aortic stenosis 03/08/2012   a.  s/p tissue AVR 10/13 with Dr. [name];   b. Echo 10/13: mod LVH, EF 55-60%, tissue AVR not well seen, no leak, gradient not too high (mean 25mHg), MAC, mild MR, mild LAE, PASP 38  . Ascites    status post paracentesis with removal of 3.4 L of ascitic fluid  . Atrial fibrillation ([place]    Permanent; off of Coumadin for now due to GI bleed  . AVM (arteriovenous malformation)    Recurrent GI bleeding requiring multiple transfusions  . CAD (coronary artery disease)    a. s/p CABG 2004;  b. L[place]10/13:  LHC 8/13: Free radial to obtuse marginal patent, SVG-diagonal patent, LIMA-LAD patent, EF 65-70%, mean aortic valve gradient 42  . Carotid bruit 06/14/2011   a. pre-AVR dopplers 10/13: no sig ICA stenosis  . Chronic diastolic heart failure ([facility]   . Chronic kidney disease   . Cirrhosis ([facility]   . COPD (chronic obstructive pulmonary disease) ([place]   . Depression   . DM2 (diabetes mellitus, type 2) (H[facility])    at least 10 yrs  . Dyspnea   . GERD (gastroesophageal reflux disease)   .  H/O hiatal hernia   . Headache   . Hyperlipidemia   . Hypertension    x 15 yrs  . Insomnia   . Iron deficiency anemia    Requiring intravenous iron  . Mediastinal adenopathy 09/22/2011  . OSA (obstructive sleep apnea) 1999    USES CPAP  . Osteoarthritis   . Overweight(278.02)   . Presence of permanent cardiac pacemaker   . Thrombocytopenia ([place])     Past Surgical History:  Procedure Laterality Date  . ABLATION  04-18-14   AVN ablation by Dr Caryl Comes  . AORTIC VALVE REPLACEMENT  04/25/2012   Procedure: AORTIC VALVE REPLACEMENT (AVR);  Surgeon: Melrose Nakayama, MD;  Location: [facility];  Service: Open Heart Surgery;  Laterality: N/A;  . AV NODE ABLATION N/A 04/18/2014    Procedure: AV NODE ABLATION;  Surgeon: Deboraha Sprang, MD;  Location: [facility] CATH LAB;  Service: Cardiovascular;  Laterality: N/A;  . CARPAL TUNNEL RELEASE    10/08/2003  . CATARACT EXTRACTION W/ INTRAOCULAR LENS IMPLANT Bilateral 09/28/2015 , 10/19/2015  . COLONOSCOPY WITH PROPOFOL N/A 10/28/2016   Procedure: COLONOSCOPY WITH PROPOFOL;  Surgeon: Rogene Houston, MD;  [facility];  Service: Endoscopy;  Laterality: N/A;  . CORONARY ANGIOPLASTY WITH STENT PLACEMENT  01/19/2005   drug eluting stent to high grade ostial stenosis of radial artery graft to OM  . CORONARY ARTERY BYPASS GRAFT   10/15/2002    [name]. [name], M.D.     . ESOPHAGOGASTRODUODENOSCOPY N/A 07/31/2013   Procedure: ESOPHAGOGASTRODUODENOSCOPY (EGD);  Surgeon: Milus Banister, MD;  Location: [place];  Service: Endoscopy;  Laterality: N/A;  . ESOPHAGOGASTRODUODENOSCOPY Left 01/11/2014   Procedure: ESOPHAGOGASTRODUODENOSCOPY (EGD);  Surgeon: Juanita Craver, MD;  Location: [facility] ENDOSCOPY;  Service: Endoscopy;  Laterality: Left;  . ESOPHAGOGASTRODUODENOSCOPY (EGD) WITH PROPOFOL N/A 10/28/2016   Procedure: ESOPHAGOGASTRODUODENOSCOPY (EGD) WITH PROPOFOL;  Surgeon: Rogene Houston, MD;  [facility];  Service: Endoscopy;  Laterality: N/A;  . HERNIA REPAIR    . KNEE ARTHROSCOPY Right 01/20/2016   Procedure: ARTHROSCOPY RIGHT KNEE WITH MENSICAL DEBRIDEMENT;  Surgeon: Gaynelle Arabian, MD;  Location: [facility];  Service: Orthopedics;  Laterality: Right;  . LEFT AND RIGHT HEART CATHETERIZATION WITH CORONARY/GRAFT ANGIOGRAM N/A 03/07/2012   Procedure: LEFT AND RIGHT HEART CATHETERIZATION WITH Beatrix Fetters;  Surgeon: Sherren Mocha, MD;  [facility] C[facility] LAB;  Service: Cardiovascular;  Laterality: N/A;  . lipoma surgery    . OTHER SURGICAL HISTORY  08/26/2011   [facility],  enteroscopy , revealing "three-four AVMs."   . PACEMAKER INSERTION  03-24-14   STJ Assurity single chamber pacemaker implanted by Dr Caryl Comes  .  PARACENTESIS  12/2015  . PERMANENT PACEMAKER INSERTION N/A 03/24/2014   Procedure: PERMANENT PACEMAKER INSERTION;  Surgeon: Deboraha Sprang, MD;  Location: [facility] CATH LAB;  Service: Cardiovascular;  Laterality: N/A;  . POLYPECTOMY  10/28/2016   Procedure: POLYPECTOMY;  Surgeon: Rogene Houston, MD;  [facility];  Service: Endoscopy;;  gastric  . RIGHT HEART CATHETERIZATION N/A 01/01/2014   Procedure: RIGHT HEART CATH;  Surgeon: Jolaine Artist, MD;  Location: [facility] CATH LAB;  Service: Cardiovascular;  Laterality: N/A;  . TEE WITHOUT CARDIOVERSION  03/07/2012   Procedure: TRANSESOPHAGEAL ECHOCARDIOGRAM (TEE);  Surgeon: Thayer Headings, MD;  Location: [place];  Service: Cardiovascular;  Laterality: N/A;    Prior to Admission medications   Medication Sig Start Date End Date Taking? Authorizing Provider  acetaminophen (TYLENOL) 500 MG tablet Take 1,000 mg by  mouth at bedtime as needed for mild pain or headache.    Yes [provider]  allopurinol (ZYLOPRIM) 100 MG tablet Take 1 tablet (100 mg total) by mouth daily. 04/18/17  Yes Larey Dresser, MD  atorvastatin (LIPITOR) 20 MG tablet Take 20 mg by mouth daily at 6 PM.    Yes [provider]  colchicine 0.6 MG tablet Take 0.6 mg by mouth daily.    Yes [provider]  ferrous gluconate (FERGON) 324 MG tablet Take 324 mg by mouth 3 (three) times daily with meals.    Yes [provider]  gabapentin (NEURONTIN) 300 MG capsule Take 300 mg by mouth 3 (three) times daily. 12/31/14  Yes [provider]  insulin NPH Human (HUMULIN N,NOVOLIN N) 100 UNIT/ML injection Inject 65 Units into the skin 2 (two) times daily before a meal.    Yes [provider]  insulin regular (NOVOLIN R,HUMULIN R) 100 units/mL injection Inject 30 Units into the skin 2 (two) times daily before a meal.    Yes [provider]  metolazone (ZAROXOLYN) 5 MG tablet Take 1 tablet (5 mg total) by mouth every 3 (three) days.  07/13/17  Yes Larey Dresser, MD  metoprolol succinate (TOPROL-XL) 100 MG 24 hr tablet Take 1 tablet (100 mg total) by mouth 2 (two) times daily. 05/17/17  Yes Larey Dresser, MD  Multiple Vitamins-Minerals ([employer] SPECTRAVITE ADULT 50+ PO) Take 1 tablet by mouth daily.   Yes [provider]  omeprazole (PRILOSEC) 20 MG capsule Take 1 capsule (20 mg total) by mouth 2 (two) times daily before a meal. 10/04/14  Yes Tanda Rockers, MD  potassium chloride SA (K-DUR,KLOR-CON) 20 MEQ tablet Take 2 tablets (40 mEq total) by mouth 2 (two) times daily. 07/13/17  Yes Larey Dresser, MD  PROAIR HFA 108 [phone] Base) MCG/ACT inhaler Take 2 puffs by mouth 2 (two) times daily. 03/28/17  Yes [provider]  sertraline (ZOLOFT) 50 MG tablet Take 1 tablet (50 mg total) by mouth daily. 07/11/17  Yes Larey Dresser, MD  spironolactone (ALDACTONE) 50 MG tablet Take 1 tablet (50 mg total) by mouth 2 (two) times daily. 04/25/17  Yes Larey Dresser, MD  tamsulosin (FLOMAX) 0.4 MG CAPS capsule TAKE ONE CAPSULE BY MOUTH EVERY DAY AFTER SUPPER 07/11/17  Yes Larey Dresser, MD  torsemide (DEMADEX) 20 MG tablet Take 6 tablets (120 mg total) by mouth 2 (two) times daily. 06/29/17  Yes Larey Dresser, MD  vitamin C (ASCORBIC ACID) 500 MG tablet Take 500 mg by mouth 2 (two) times daily.   Yes [provider]    Current Facility-Administered Medications  Medication Dose Route Frequency Provider Last Rate Last Dose  . 0.9 %  sodium chloride infusion  10 mL/hr Intravenous Once Hayden Rasmussen, MD      . 0.9 %  sodium chloride infusion   Intravenous Once Tat, Shanon Brow, MD      . 0.9 %  sodium chloride infusion   Intravenous Once Tat, Shanon Brow, MD      . acetaminophen (TYLENOL) tablet 650 mg  650 mg Oral Q6H PRN Tat, Shanon Brow, MD       Or  . acetaminophen (TYLENOL) suppository 650 mg  650 mg Rectal Q6H PRN Tat, Shanon Brow, MD      . allopurinol (ZYLOPRIM) tablet 100 mg  100 mg Oral Daily Tat, David, MD   100 mg  at 08/25/17 1039  . atorvastatin (LIPITOR) tablet  20 mg  20 mg Oral q1800 Orson Eva, MD   20 mg at 08/25/17 0115  . cefTRIAXone (ROCEPHIN) 1 g in dextrose 5 % 50 mL IVPB  1 g Intravenous Q24H Rehman, Najeeb U, MD      . ferrous gluconate (FERGON) tablet 324 mg  324 mg Oral TID WC Orson Eva, MD   324 mg at 08/25/17 1039  . gabapentin (NEURONTIN) capsule 300 mg  300 mg Oral Benay Pike, MD   300 mg at 08/25/17 0112  . insulin aspart (novoLOG) injection 0-15 Units  0-15 Units Subcutaneous TID WC Orson Eva, MD   2 Units at 08/25/17 [phone]  . insulin aspart (novoLOG) injection 0-5 Units  0-5 Units Subcutaneous QHS Orson Eva, MD   2 Units at 08/25/17 0000  . [START ON 08/26/2017] metolazone (ZAROXOLYN) tablet 5 mg  5 mg Oral Q72H Tat, David, MD      . metoprolol succinate (TOPROL-XL) 24 hr tablet 25 mg  25 mg Oral BID Tat, Shanon Brow, MD   25 mg at 08/25/17 0111  . octreotide (SANDOSTATIN) 500 mcg in sodium chloride 0.9 % 250 mL (2 mcg/mL) infusion  50 mcg/hr Intravenous Continuous Tat, David, MD 25 mL/hr at 08/25/17 0932 50 mcg/hr at 08/25/17 0932  . ondansetron (ZOFRAN) tablet 4 mg  4 mg Oral Q6H PRN Tat, David, MD       Or  . ondansetron (ZOFRAN) injection 4 mg  4 mg Intravenous Q6H PRN Tat, David, MD      . pantoprazole (PROTONIX) injection 40 mg  40 mg Intravenous Therisa Doyne, MD   40 mg at 08/25/17 1045  . sertraline (ZOLOFT) tablet 50 mg  50 mg Oral Daily Tat, David, MD   50 mg at 08/25/17 1040  . tamsulosin (FLOMAX) capsule 0.4 mg  0.4 mg Oral QPC supper Tat, Shanon Brow, MD   0.4 mg at 08/25/17 0115  . torsemide (DEMADEX) tablet 120 mg  120 mg Oral BID Orson Eva, MD   120 mg at 08/25/17 0835  . vitamin C (ASCORBIC ACID) tablet 500 mg  500 mg Oral BID Orson Eva, MD   500 mg at 08/25/17 1040    Allergies as of 08/24/2017 - Review Complete 08/24/2017  Allergen Reaction Noted  . Codeine Other (See Comments) and Palpitations 12/27/2010  . Diltiazem hcl Itching   . Niacin Other (See Comments)   .  Aspirin  01/20/2016  . Diltiazem hcl Other (See Comments) and Hives 10/12/2015    Family History  Problem Relation Age of Onset  . Hypertension Father   . Diabetes Father   . Heart disease Father   . COPD Sister   . Cancer Maternal Aunt        Breast cancer   . Cancer Maternal Aunt        Breast cancer   . Diabetes Son   . Cancer Daughter        Cervical cancer  . Coronary artery disease Unknown        FAMILY HISTORY    Social History   Socioeconomic History  . Marital status: Married    Spouse name: Not on file  . Number of children: 3  . Years of education: Not on file  . Highest education level: Not on file  Social Needs  . Financial resource strain: Not on file  . Food insecurity - worry: Not on file  . Food insecurity - inability: Not on file  . Transportation needs - medical:  Not on file  . Transportation needs - non-medical: Not on file  Occupational History    Comment: Associate Professor at [employer]  . Smoking status: Former Smoker    Packs/day: 1.00    Years: 30.00    Pack years: 30.00    Types: Cigarettes    Last attempt to quit: 07/25/2000    Years since quitting: 17.0  . Smokeless tobacco: Never Used  . Tobacco comment: Quit smoking in 2003  Substance and Sexual Activity  . Alcohol use: No    Alcohol/week: 0.6 oz    Types: 1 Shots of liquor per week    Comment: stopped drinking alcohol  . Drug use: No  . Sexual activity: Yes  Other Topics Concern  . Not on file  Social History Narrative   Lives in [place], [place] with wife.     Review of Systems: See HPI, otherwise normal ROS  Physical Exam: Temp:  [97.5 F (36.4 C)-98.2 F (36.8 C)] 98.1 F (36.7 C) (02/01 0430) Pulse Rate:  [73-78] 75 (02/01 1039) Resp:  [12-24] 13 (02/01 0900) BP: (77-122)/(40-82) 100/57 (02/01 1039) SpO2:  [92 %-100 %] 95 % (02/01 1000) Weight:  [275 lb 2.2 oz (124.8 kg)-278 lb (126.1 kg)] 275 lb 2.2 oz (124.8 kg) (02/01 0500) Last BM Date:  08/24/17  Patient is alert and in no acute distress. Conjunctiva is pale.  Clear are nonicteric. Oropharyngeal mucosa is dry.  He has dentures in place. No neck masses or thyromegaly. Neck exam with regular rhythm normal S1 and S2.  Faint systolic ejection murmur best heard at aortic area. Auscultation of lungs revealed clear breath sounds bilaterally. Abdomen is distended.  Bowel sounds are normal.  On palpation is soft.  No organomegaly or masses.  Shifting dullness present. No peripheral edema or clubbing noted.   Lab Results: Recent Labs    08/24/17 1404 08/25/17 0938  WBC 14.7* 11.3*  HGB 7.0* 8.5*  HCT 24.2* 28.0*  PLT 192 172   BMET Recent Labs    08/24/17 1404  NA 122*  K 5.1  CL 89*  CO2 17*  GLUCOSE 225*  BUN 216*  CREATININE 3.52*  CALCIUM 8.8*   LFT Recent Labs    08/24/17 1404  PROT 6.7  ALBUMIN 3.2*  AST 23  ALT 13*  ALKPHOS 62  BILITOT 0.7   PT/INR Recent Labs    08/24/17 1404  LABPROT 14.3  INR 1.12   Hepatitis Panel No results for input(s): HEPBSAG, HCVAB, HEPAIGM, HEPBIGM in the last 72 hours.  Studies/Results: Dg Chest 2 View  Result Date: 08/24/2017 CLINICAL DATA:  Pt sent over for low hemoglobin 7.3. States dark tarry stool x1 week and generalized weakness,COPD, CHF EXAM: CHEST  2 VIEW COMPARISON:  06/01/2017 FINDINGS: Patient's left-sided transvenous pacemaker with leads to the right ventricle. Status post median sternotomy. The heart is enlarged. There is mild prominence of interstitial markings, stable in appearance. There are no focal consolidations. There is right costophrenic angle blunting. IMPRESSION: 1. Cardiomegaly. 2. Suspect mild interstitial pulmonary edema. Electronically Signed   By: Nolon Nations M.D.   On: 08/24/2017 14:12    Assessment;  Patient is 72 year old Caucasian male with multiple medical problems including decompensated liver disease/cirrhosis secondary to NASH complicated by ascites who presents with  found weakness in found to be anemic.  Stool was guaiac positive and black but he has been on iron chronically.  He has received 2 units of PRBCs and hemoglobin is up from  7 g to 8.5 g. Patient has several year history of GI bleed with multiple interventions in the past and he was felt to be bleeding from GI angiodysplasia given history of aortic valve which has since been replaced.  He underwent EGD and colonoscopy in April last year revealing type of gastric polyps with ulceration and stigmata of bleed.  He also had mild gave and grade 1 esophageal varices. Suspect chronic/acute GI bleed possibly from small bowel.  GI bleed from gave her recurrent gastric polyps needs to be ruled out.  Doubt that esophageal varices progressed to the point of causing bleeding and a few months. Patient is hemodynamically stable.  Mildly elevated troponin level felt to be due to demand ischemia in the setting of profound anemia.  She has been evaluated by Dr. Johnny Bridge of cardiology service.  Recommendations;  Ceftriaxone 1 g IV every 24 hours.  First dose now. Esophagogastroduodenoscopy with therapeutic intention under monitored anesthesia care. Further recommendations to follow.   LOS: 1 day   Najeeb Rehman  08/25/2017, 10:56 AM

## 2017-08-25 NOTE — Progress Notes (Signed)
Received call from [facility], Octreotide not running. Pharmacy called to obtain missing dose. Will start once received Octreotide from pharmcy.

## 2017-08-25 NOTE — Consult Note (Signed)
Cardiology Consult    Patient ID: [name]; [id number]; 1945/09/18   Admit date: 08/24/2017 Date of Consult: 08/25/2017  Primary Care Provider: [name], MD Primary Cardiologist: Dr. Aundra Dubin Primary Electrophysiologist: Dr. Caryl Comes  Patient Profile    [name] is a 72 y.o. male with past medical history of CAD (s/p CABG in 2004 with cath in 2013 showing patent LIMA-LAD, Free Radial-OM, and SVG-D1), aortic stenosis (s/p tissue AVR in 04/2012), persistent atrial fibrillation (s/p ablation with PPM placement in 2015, no longer on anticoagulation 2ry to recurrent GIB in the setting of AVMs), chronic diastolic CHF, cirrhosis, HTN, HLD, Type 2 DM, Stage 4 CKD and OSA who is being seen today for the evaluation of CHF and elevated troponin at the request of Dr. Carles Collet.  History of Present Illness    [name] is followed closely by the CHF clinic as he has required frequent therapeutic paracentesis over the past year with the most recent being on 08/10/17 with -3.6L of fluid removed at that time. At the time of his last office visit on 08/10/2017, weight was at 277 lbs.    Was evaluated by his PCP yesterday and reported worsening fatigue and dyspnea. Labs were checked at that time and he was found to have a Hgb of 7.0, therefore he was referred to the ED for further evaluation. In talking with the patient today, he reports having baseline dyspnea on exertion for the past several years but has noticed slight worsening of his symptoms over the past two weeks. Has also experienced worsening fatigue. Denies any chest pain or palpitations.   Initial labs show WBC 14.7, Hgb 7.0 (previously 9.2 in 06/2017), platelets 192, Na+ 122, K+ 5.1, creatine 3.52 (baseline 2.7 - 2.8). Occult blood positive. Initial troponin 0.16 with repeat value of 0.13. CXR showing cardiomegaly with mild interstitial pulmonary edema. EKG shows V-paced rhythm, HR 75.  He has received 2 units pRBC's with repeat labs  pending. Weight is stable at 275 lbs this morning. He is scheduled for a repeat therapeutic paracentesis later today. Has been continued on PTA Torsemide 169m BID.    Past Medical History:  Diagnosis Date  . Aortic stenosis 03/08/2012   a.  s/p tissue AVR 10/13 with Dr. HRoxan Hockey   b. Echo 10/13: mod LVH, EF 55-60%, tissue AVR not well seen, no leak, gradient not too high (mean 11mg), MAC, mild MR, mild LAE, PASP 38  . Ascites    status post paracentesis with removal of 3.4 L of ascitic fluid  . Atrial fibrillation ([facility] [place]   Permanent; off of Coumadin for now due to GI bleed  . AVM (arteriovenous malformation)    Recurrent GI bleeding requiring multiple transfusions  . CAD (coronary artery disease)    a. s/p CABG 2004;  [place]. [place]:  LHC 8/13: Free radial to obtuse marginal patent, SVG-diagonal patent, LIMA-LAD patent, EF 65-70%, mean aortic valve gradient 42  . Carotid bruit 06/14/2011   a. pre-AVR dopplers 10/13: no sig ICA stenosis  . Chronic diastolic heart failure ([facility] [place]  . Chronic kidney disease   . Cirrhosis ([facility]  . COPD (chronic obstructive pulmonary disease) ([facility]CO[facility] [place]  . Depression   . DM2 (diabetes mellitus, type 2) (HCC)    at least 10 yrs  . Dyspnea   . GERD (gastroesophageal reflux disease)   . H/O hiatal hernia   . Headache   . Hyperlipidemia   . Hypertension    x 15  yrs  . Insomnia   . Iron deficiency anemia    Requiring intravenous iron  . Mediastinal adenopathy 09/22/2011  . OSA (obstructive sleep apnea) 1999    USES CPAP  . Osteoarthritis   . Overweight(278.02)   . Presence of permanent cardiac pacemaker   . Thrombocytopenia ([place])     Past Surgical History:  Procedure Laterality Date  . ABLATION  04-18-14   AVN ablation by Dr Caryl Comes  . AORTIC VALVE REPLACEMENT  04/25/2012   Procedure: AORTIC VALVE REPLACEMENT (AVR);  Surgeon: Melrose Nakayama, MD;  Location: [place];  Service: Open Heart Surgery;  Laterality: N/A;  . AV NODE ABLATION N/A 04/18/2014     Procedure: AV NODE ABLATION;  Surgeon: Deboraha Sprang, MD;  Location: [facility] CATH LAB;  Service: Cardiovascular;  Laterality: N/A;  . CARPAL TUNNEL RELEASE    10/08/2003  . CATARACT EXTRACTION W/ INTRAOCULAR LENS IMPLANT Bilateral 09/28/2015 , 10/19/2015  . COLONOSCOPY WITH PROPOFOL N/A 10/28/2016   Procedure: COLONOSCOPY WITH PROPOFOL;  Surgeon: Rogene Houston, MD;  [facility];  Service: Endoscopy;  Laterality: N/A;  . CORONARY ANGIOPLASTY WITH STENT PLACEMENT  01/19/2005   drug eluting stent to high grade ostial stenosis of radial artery graft to OM  . CORONARY ARTERY BYPASS GRAFT   10/15/2002    [name]. [name], M.D.     . ESOPHAGOGASTRODUODENOSCOPY N/A 07/31/2013   Procedure: ESOPHAGOGASTRODUODENOSCOPY (EGD);  Surgeon: Milus Banister, MD;  Location: [place];  Service: Endoscopy;  Laterality: N/A;  . ESOPHAGOGASTRODUODENOSCOPY Left 01/11/2014   Procedure: ESOPHAGOGASTRODUODENOSCOPY (EGD);  Surgeon: Matthew Craver, MD;  Location[facility] ENDOSCOPY;  Service: Endoscopy;  Laterality: Left;  . ESOPHAGOGASTRODUODENOSCOPY (EGD) WITH PROPOFOL N/A 10/28/2016   Procedure: ESOPHAGOGASTRODUODENOSCOPY (EGD) WITH PROPOFOL;  Surgeon: Rogene Houston, MD;  [facility];  Service: Endoscopy;  Laterality: N/A;  . HERNIA REPAIR    . KNEE ARTHROSCOPY Right 01/20/2016   Procedure: ARTHROSCOPY RIGHT KNEE WITH MENSICAL DEBRIDEMENT;  Surgeon: Matthew Arabian, MD;  Location: [facility];  Service: Orthopedics;  Laterality: Right;  . LEFT AND RIGHT HEART CATHETERIZATION WITH CORONARY/GRAFT ANGIOGRAM N/A 03/07/2012   Procedure: LEFT AND RIGHT HEART CATHETERIZATION WITH [name];  Surgeon: Sherren Mocha, MD;  Location[facility] CATH LAB;  Service: Cardiovascular;  Laterality: N/A;  . lipoma surgery    . OTHER SURGICAL HISTORY  08/26/2011   [facility],  enteroscopy , revealing "three-four AVMs."   . PACEMAKER INSERTION  03-24-14   STJ Assurity single chamber pacemaker implanted by Dr Caryl Comes  .  PARACENTESIS  12/2015  . PERMANENT PACEMAKER INSERTION N/A 03/24/2014   Procedure: PERMANENT PACEMAKER INSERTION;  Surgeon: Deboraha Sprang, MD;  Location: [facility] CATH LAB;  Service: Cardiovascular;  Laterality: N/A;  . POLYPECTOMY  10/28/2016   Procedure: POLYPECTOMY;  Surgeon: Rogene Houston, MD;  [facility];  Service: Endoscopy;;  gastric  . RIGHT HEART CATHETERIZATION N/A 01/01/2014   Procedure: RIGHT HEART CATH;  Surgeon: Jolaine Artist, MD;  Location[facility] CATH LAB;  Service: Cardiovascular;  Laterality: N/A;  . TEE WITHOUT CARDIOVERSION  03/07/2012   Procedure: TRANSESOPHAGEAL ECHOCARDIOGRAM (TEE);  Surgeon: Thayer Headings, MD;  Location: [place];  Service: Cardiovascular;  Laterality: N/A;     Home Medications:  Prior to Admission medications   Medication Sig Start Date End Date Taking? Authorizing Provider  acetaminophen (TYLENOL) 500 MG tablet Take 1,000 mg by mouth at bedtime as needed for mild pain or headache.    Yes [provider]  allopurinol (ZYLOPRIM) 100 MG tablet Take 1 tablet (100 mg total) by mouth daily. 04/18/17  Yes Larey Dresser, MD  atorvastatin (LIPITOR) 20 MG tablet Take 20 mg by mouth daily at 6 PM.    Yes [provider]  colchicine 0.6 MG tablet Take 0.6 mg by mouth daily.    Yes [provider]  ferrous gluconate (FERGON) 324 MG tablet Take 324 mg by mouth 3 (three) times daily with meals.    Yes [provider]  gabapentin (NEURONTIN) 300 MG capsule Take 300 mg by mouth 3 (three) times daily. 12/31/14  Yes [provider]  insulin NPH Human (HUMULIN N,NOVOLIN N) 100 UNIT/ML injection Inject 65 Units into the skin 2 (two) times daily before a meal.    Yes [provider]  insulin regular (NOVOLIN R,HUMULIN R) 100 units/mL injection Inject 30 Units into the skin 2 (two) times daily before a meal.    Yes [provider]  metolazone (ZAROXOLYN) 5 MG tablet Take 1 tablet (5 mg total) by mouth  every 3 (three) days. 07/13/17  Yes Larey Dresser, MD  metoprolol succinate (TOPROL-XL) 100 MG 24 hr tablet Take 1 tablet (100 mg total) by mouth 2 (two) times daily. 05/17/17  Yes Larey Dresser, MD  Multiple Vitamins-Minerals ([employer] SPECTRAVITE ADULT 50+ PO) Take 1 tablet by mouth daily.   Yes [provider]  omeprazole (PRILOSEC) 20 MG capsule Take 1 capsule (20 mg total) by mouth 2 (two) times daily before a meal. 10/04/14  Yes Tanda Rockers, MD  potassium chloride SA (K-DUR,KLOR-CON) 20 MEQ tablet Take 2 tablets (40 mEq total) by mouth 2 (two) times daily. 07/13/17  Yes Larey Dresser, MD  PROAIR HFA 108 [phone] Base) MCG/ACT inhaler Take 2 puffs by mouth 2 (two) times daily. 03/28/17  Yes [provider]  sertraline (ZOLOFT) 50 MG tablet Take 1 tablet (50 mg total) by mouth daily. 07/11/17  Yes Larey Dresser, MD  spironolactone (ALDACTONE) 50 MG tablet Take 1 tablet (50 mg total) by mouth 2 (two) times daily. 04/25/17  Yes Larey Dresser, MD  tamsulosin (FLOMAX) 0.4 MG CAPS capsule TAKE ONE CAPSULE BY MOUTH EVERY DAY AFTER SUPPER 07/11/17  Yes Larey Dresser, MD  torsemide (DEMADEX) 20 MG tablet Take 6 tablets (120 mg total) by mouth 2 (two) times daily. 06/29/17  Yes Larey Dresser, MD  vitamin C (ASCORBIC ACID) 500 MG tablet Take 500 mg by mouth 2 (two) times daily.   Yes [provider]    Inpatient Medications: Scheduled Meds: . allopurinol  100 mg Oral Daily  . atorvastatin  20 mg Oral q1800  . ferrous gluconate  324 mg Oral TID WC  . gabapentin  300 mg Oral QHS  . insulin aspart  0-15 Units Subcutaneous TID WC  . insulin aspart  0-5 Units Subcutaneous QHS  . [START ON 08/26/2017] metolazone  5 mg Oral Q72H  . metoprolol succinate  25 mg Oral BID  . octreotide  50 mcg Intravenous Once  . octreotide  50 mcg Intravenous Once  . pantoprazole (PROTONIX) IV  40 mg Intravenous Q12H  . sertraline  50 mg Oral Daily  . tamsulosin  0.4 mg Oral QPC supper    . torsemide  120 mg Oral BID  . vitamin C  500 mg Oral BID   Continuous Infusions: . sodium chloride    . sodium chloride    . sodium chloride    .  octreotide  (SANDOSTATIN)    IV infusion    . octreotide  (SANDOSTATIN)    IV infusion     PRN Meds: acetaminophen **OR** acetaminophen, ondansetron **OR** ondansetron (ZOFRAN) IV  Allergies:    Allergies  Allergen Reactions  . Codeine Other (See Comments) and Palpitations    Hurting in chest  . Diltiazem Hcl Itching  . Niacin Other (See Comments)    Felt a "severe burning sensation" Hot flashes  . Aspirin     History of Bleeding ulcers   . Diltiazem Hcl Other (See Comments) and Hives    Gets hot    Social History:   Social History   Socioeconomic History  . Marital status: Married    Spouse name: Not on file  . Number of children: 3  . Years of education: Not on file  . Highest education level: Not on file  Social Needs  . Financial resource strain: Not on file  . Food insecurity - worry: Not on file  . Food insecurity - inability: Not on file  . Transportation needs - medical: Not on file  . Transportation needs - non-medical: Not on file  Occupational History    Comment: Associate Professor at [employer]  . Smoking status: Former Smoker    Packs/day: 1.00    Years: 30.00    Pack years: 30.00    Types: Cigarettes    Last attempt to quit: 07/25/2000    Years since quitting: 17.0  . Smokeless tobacco: Never Used  . Tobacco comment: Quit smoking in 2003  Substance and Sexual Activity  . Alcohol use: No    Alcohol/week: 0.6 oz    Types: 1 Shots of liquor per week    Comment: stopped drinking alcohol  . Drug use: No  . Sexual activity: Yes  Other Topics Concern  . Not on file  Social History Narrative   Lives in [place], [place] with wife.      Family History:    Family History  Problem Relation Age of Onset  . Hypertension Father   . Diabetes Father   . Heart disease Father   . COPD Sister   .  Cancer Maternal Aunt        Breast cancer   . Cancer Maternal Aunt        Breast cancer   . Diabetes Son   . Cancer Daughter        Cervical cancer  . Coronary artery disease Unknown        FAMILY HISTORY      Review of Systems    General:  No chills, fever, night sweats or weight changes. Positive for fatigue.  Cardiovascular:  No chest pain,  edema, orthopnea, palpitations, paroxysmal nocturnal dyspnea. Positive for dyspnea on exertion.  Dermatological: No rash, lesions/masses Respiratory: No cough, dyspnea Urologic: No hematuria, dysuria Abdominal:   No nausea, vomiting, diarrhea, bright red blood per rectum, melena, or hematemesis Neurologic:  No visual changes, wkns, changes in mental status. All other systems reviewed and are otherwise negative except as noted above.  Physical Exam/Data    Vitals:   08/25/17 0445 08/25/17 0500 08/25/17 0600 08/25/17 0700  BP: 105/82 (!) 88/58 (!) 101/58 120/62  Pulse: 77 76 75 77  Resp: 15 14 (!) 21 19  Temp:      TempSrc:      SpO2: 92% 96% 94% 95%  Weight:  275 lb 2.2 oz (124.8 kg)    Height:  Intake/Output Summary (Last 24 hours) at 08/25/2017 0916 Last data filed at 08/25/2017 0500 Gross per 24 hour  Intake 945 ml  Output 500 ml  Net 445 ml   Filed Weights   08/24/17 1316 08/25/17 0500  Weight: 278 lb (126.1 kg) 275 lb 2.2 oz (124.8 kg)   Body mass index is 41.83 kg/m.   General: Pleasant, Caucasian male appearing in NAD Psych: Normal affect. Neuro: Alert and oriented X 3. Moves all extremities spontaneously. HEENT: Normal  Neck: Supple without bruits or JVD. Lungs:  Resp regular and unlabored, mild rales along bases bilaterally. Heart: RRR no s3, s4, 2/6 SEM along RUSB. Abdomen: Soft, non-tender,  BS + x 4. Abdominal distension.  Extremities: No clubbing or cyanosis. Trace edema. DP/PT/Radials 2+ and equal bilaterally.   EKG:  The EKG was personally reviewed and demonstrates: V-paced, HR 75. Telemetry:   Telemetry was personally reviewed and demonstrates: V-pacing, HR in 70's to 80's.    Labs/Studies     Relevant CV Studies:  Echocardiogram: 08/19/2016 Study Conclusions  - Left ventricle: The cavity size was normal. Wall thickness was   increased in a pattern of moderate LVH. Systolic function was   normal. The estimated ejection fraction was in the range of 60%   to 65%. Wall motion was normal; there were no regional wall   motion abnormalities. Features are consistent with a pseudonormal   left ventricular filling pattern, with concomitant abnormal   relaxation and increased filling pressure (grade 2 diastolic   dysfunction). - Aortic valve: A bioprosthesis was present. Valve area (VTI): 0.71   cm^2. Valve area (Vmax): 0.7 cm^2. Valve area (Vmean): 0.64 cm^2. - Mitral valve: There was mild regurgitation. Valve area by   pressure half-time: 1.83 cm^2. Valve area by continuity equation   (using LVOT flow): 0.83 cm^2. - Left atrium: The atrium was mildly to moderately dilated. - Right atrium: The atrium was moderately dilated. - Tricuspid valve: There was moderate regurgitation. - Pulmonary arteries: Systolic pressure was moderately increased.   PA peak pressure: 59 mm Hg (S).  Laboratory Data:  Chemistry Recent Labs  Lab 08/24/17 1404  NA 122*  K 5.1  CL 89*  CO2 17*  GLUCOSE 225*  BUN 216*  CREATININE 3.52*  CALCIUM 8.8*  GFRNONAA 16*  GFRAA 19*  ANIONGAP 16*    Recent Labs  Lab 08/24/17 1404  PROT 6.7  ALBUMIN 3.2*  AST 23  ALT 13*  ALKPHOS 62  BILITOT 0.7   Hematology Recent Labs  Lab 08/24/17 1404  WBC 14.7*  RBC 2.82*  HGB 7.0*  HCT 24.2*  MCV 85.8  MCH 24.8*  MCHC 28.9*  RDW 19.2*  PLT 192   Cardiac Enzymes Recent Labs  Lab 08/24/17 1404 08/24/17 2004  TROPONINI 0.16* 0.13*   No results for input(s): TROPIPOC in the last 168 hours.  BNPNo results for input(s): BNP, PROBNP in the last 168 hours.  DDimer No results for input(s):  DDIMER in the last 168 hours.  Radiology/Studies:  Dg Chest 2 View  Result Date: 08/24/2017 CLINICAL DATA:  Pt sent over for low hemoglobin 7.3. States dark tarry stool x1 week and generalized weakness,COPD, CHF EXAM: CHEST  2 VIEW COMPARISON:  06/01/2017 FINDINGS: Patient's left-sided transvenous pacemaker with leads to the right ventricle. Status post median sternotomy. The heart is enlarged. There is mild prominence of interstitial markings, stable in appearance. There are no focal consolidations. There is right costophrenic angle blunting. IMPRESSION: 1. Cardiomegaly. 2. Suspect mild  interstitial pulmonary edema. Electronically Signed   By: Nolon Nations M.D.   On: 08/24/2017 14:12    Assessment & Plan    1. Chronic Diastolic CHF - the patient has a known history of diastolic CHF with echo in 07/2016 showing a preserved EF of 60-65%. He presented to the ED for worsening fatigue and dyspnea after being found to have a Hgb of 7.0. Weight is stable at 275 lbs today, at 277 lbs during his office visit on 08/10/2017.  - he does appear distended on examination today and a repeat paracentesis is planned. Most recent was performed on 08/10/17 with -3.6L of fluid removed. - would continue with PTA Torsemide dosing at this time (takes Metolazone every 3 days). Monitor volume status closely with strict I&O's along with daily weights while receiving transfusions.   2. CAD/ Elevated Troponin - he is s/p CABG in 2004 with cath in 2013 showing patent LIMA-LAD, Free Radial-OM, and SVG-D1. He has baseline dyspnea on exertion but denies any recent chest pain.  - initial and repeat troponin values have been flat at 0.16 and 0.13, likely secondary to demand ischemia in the setting of his symptomatic anemia. No plans for further ischemic evaluation at this time.  - continue BB and statin therapy.   3. Aortic stenosis  - s/p tissue AVR in 04/2012. Echo in 07/2016 showed normal function of the prosthetic valve.     4. Persistent atrial fibrillation  - s/p ablation with PPM placement in 2015. He is no longer on anticoagulation secondary  to recurrent GIB in the setting of AVM's. He denies any recent palpitations.  - continue Toprol-XL 49m BID.    5. HTN - BP initially soft, improved to 120/62 on most recent check.   - PTA Spironolactone held in the setting of hypotension and AKI.   6. Acute on Chronic Stage 4 CKD - creatine elevated to 3.52 on admission (baseline 2.7 - 2.8).' - repeat BMET pending.  7. Symptomatic Anemia - Hgb 7.0 on admission, s/p 2 units pRBC's. - repeat CBC pending. GI consult has been requested by the admitting team.    For questions or updates, please contact CMcNealPlease consult [email or web address] for contact info under Cardiology/STEMI.  SChestine Spore PA-C 08/25/2017, 9:16 AM Pager: [phone]  Attending note:  Patient seen and examined.  Reviewed extensive records and discussed the case with Ms. SAhmed PrimaPA-C.  Mr. EMarenopresents with weakness and shortness of breath, found to have symptomatic anemia with hemoglobin down to 7.0.  He otherwise reports compliance with his outpatient diuretic regimen.  He did undergo a paracentesis about 2 weeks ago with removal of 3.6 L, has already had reaccumulation.  States that he may be drinking more fluid than recommended.  Otherwise he has had no chest pain or palpitations.  On examination this morning he reports feeling somewhat better.  Heart rate is in the 70s with pacing evident by telemetry, systolic blood pressure in the 100-110 range on average.  Lungs exhibit decreased breath sounds at the bases.  Cardiac exam with RRR and 2/6 to 3/6 systolic murmur.  Abdomen is protuberant and somewhat distended.  Lab work shows BUN to 16 and creatinine 3.52, minor increase in troponin I of 0.16 and 0.13, hemoglobin 7.0, platelets 182.  Stools are heme positive.  I personally reviewed his ECG which shows a ventricular  paced rhythm.  Minimal, flat elevation in troponin I, not likely to represent ACS.  This is noted in the  setting of symptomatic anemia and consistent with demand ischemia.  Recommend supportive measures, GI consultation with suspected GI bleed (history of same with AVMs).  May need another paracentesis as well.  Otherwise plan to continue current outpatient diuretic regimen.  Ischemic testing is not planned at this time.  Satira Sark, M.D., F.A.C.C.

## 2017-08-25 NOTE — Anesthesia Preprocedure Evaluation (Signed)
Anesthesia Evaluation    Airway Mallampati: II  TM Distance: >3 FB     Dental  (+) Edentulous Upper, Edentulous Lower   Pulmonary shortness of breath, sleep apnea , COPD, former smoker,    breath sounds clear to auscultation       Cardiovascular hypertension, + CAD, + CABG and +CHF  + dysrhythmias + pacemaker + Valvular Problems/Murmurs (s/p AVR)  Rhythm:Regular Rate:Normal     Neuro/Psych  Headaches,    GI/Hepatic hiatal hernia, GERD  ,(+) Cirrhosis       , GI bleed    Endo/Other  diabetes, Type 2Morbid obesity  Renal/GU Renal disease     Musculoskeletal   Abdominal   Peds  Hematology  (+) anemia ,   Anesthesia Other Findings   Reproductive/Obstetrics                             Anesthesia Physical Anesthesia Plan  ASA: IV and emergent  Anesthesia Plan: MAC   Post-op Pain Management:    Induction: Intravenous  PONV Risk Score and Plan:   Airway Management Planned: Simple Face Mask  Additional Equipment:   Intra-op Plan:   Post-operative Plan:   Informed Consent: I have reviewed the patients History and Physical, chart, labs and discussed the procedure including the risks, benefits and alternatives for the proposed anesthesia with the patient or authorized representative who has indicated his/her understanding and acceptance.     Plan Discussed with:   Anesthesia Plan Comments:         Anesthesia Quick Evaluation

## 2017-08-25 NOTE — Consult Note (Signed)
Consultation Note Date: 08/25/2017   Patient Name: [name]  DOB: [birth year]  MRN: [id number]  Age / Sex: 73 y.o., male  PCP: Matthew Melter, MD Referring Physician: Orson Eva, MD  Reason for Consultation: Establishing goals of care and Psychosocial/spiritual support  HPI/Patient Profile: 72 y.o. male  with past medical history of coronary artery disease with a CABG in 2004, follow-up stenting 2012 approximately, permanent A. fib with ablation) and permanent pacemaker, heart failure, recurrent GI bleed, diabetes, kidney disease stage III, paracentesis approximately every 2-3 weeks admitted on 08/24/2017 with acute blood loss anemia.   Clinical Assessment and Goals of Care: [name] is resting quietly in bed.  He greets me making and keeping eye contact as I enter.  He is calm and cooperative, pleasant.  Present today at bedside is his daughter [name].  We talked about his functional health status.  He is able to provide his own bathing and dressing, but has help around the house.  We also talked about his chronic health history.  Both he and his daughter [name] are able to give correct, detailed information related to his health.  [name] shares that he would have paracentesis approximately every 2-3 weeks for over 1 year now.  He has retired since 2001.  I ask how his wife manages at home, he shares that she has arthritis in her back and knees, her health is not great either.  [name] shares that her brother lives with [name], he has juvenile diabetes that was uncontrolled as a young person and is now on hemodialysis.  The [place] family also lost a daughter to cancer approximately 3 years ago.  We talked about trusted physicians.  [name] states that he feels he has excellent physicians.  We talked about the concept of Pleurx drain if [name] becomes tired of traveling back and forth for  paracentesis.  [name] states that her sister had Pleurx at home with hospice, they are familiar.  [name] states that he has not been driving over the last 3 months, he also states that he has been sleeping poorly.  He endorses that "things look different".  We talked about the concept of treat the treatable.  I shared that there may come a time when he decides that he does not want to be as aggressive with care.  This family is very knowledgeable, realistic about [name] health and the future for him.  They have good resources, and good support.  Care power of attorney NEXT OF KIN -wife [name] his main decision maker, daughter [name] is very active in her father's care.   SUMMARY OF RECOMMENDATIONS   At this point, continue to treat the treatable, but no extraordinary measures such as CPR or intubation. We discussed the use of Pleurx drain as a possibility for the future. We discussed the possibility of in-home hospice services if/when the time is right.  Code Status/Advance Care Planning:  DNR -verified at bedside today.  Symptom Management:   Per  hospitalist, no additional needs at this time.  Palliative Prophylaxis:   Frequent Pain Assessment and Turn Reposition  Additional Recommendations (Limitations, Scope, Preferences):  Treat the treatable but no CPR, no intubation.  Psycho-social/Spiritual:   Desire for further Chaplaincy support:no  Additional Recommendations: Caregiving  Support/Resources and Education on Hospice  Prognosis:   < 12 months, would not be surprising based on functional status, severity of liver disease, severity of heart disease.  Discharge Planning: Agreeable to rehab if qualified.      Primary Diagnoses: Present on Admission: . Acute blood loss anemia . Permanent atrial fibrillation ([name]) . Atrioventricular block, complete ([name])   I have reviewed the medical record, interviewed the patient and family, and examined the  patient. The following aspects are pertinent.  Past Medical History:  Diagnosis Date  . Aortic stenosis 03/08/2012   a.  s/p tissue AVR 10/13 with Dr. [name];   b. Echo 10/13: mod LVH, EF 55-60%, tissue AVR not well seen, no leak, gradient not too high (mean 83mHg), MAC, mild MR, mild LAE, PASP 38  . Ascites    status post paracentesis with removal of 3.4 L of ascitic fluid  . Atrial fibrillation ([facility]    Permanent; off of Coumadin for now due to GI bleed  . AVM (arteriovenous malformation)    Recurrent GI bleeding requiring multiple transfusions  . CAD (coronary artery disease)    a. s/p CABG 2004;  b. LComptche10/13:  LHC 8/13: Free radial to obtuse marginal patent, SVG-diagonal patent, LIMA-LAD patent, EF 65-70%, mean aortic valve gradient 42  . Carotid bruit 06/14/2011   a. pre-AVR dopplers 10/13: no sig ICA stenosis  . Chronic diastolic heart failure ([facility]   . Chronic kidney disease   . Cirrhosis ([facility]   . COPD (chronic obstructive pulmonary disease) ([facility]   . Depression   . DM2 (diabetes mellitus, type 2) (HCC)    at least 10 yrs  . Dyspnea   . GERD (gastroesophageal reflux disease)   . H/O hiatal hernia   . Headache   . Hyperlipidemia   . Hypertension    x 15 yrs  . Insomnia   . Iron deficiency anemia    Requiring intravenous iron  . Mediastinal adenopathy 09/22/2011  . OSA (obstructive sleep apnea) 1999    USES CPAP  . Osteoarthritis   . Overweight(278.02)   . Presence of permanent cardiac pacemaker   . Thrombocytopenia ([facility]    Social History   Socioeconomic History  . Marital status: Married    Spouse name: None  . Number of children: 3  . Years of education: None  . Highest education level: None  Social Needs  . Financial resource strain: None  . Food insecurity - worry: None  . Food insecurity - inability: None  . Transportation needs - medical: None  . Transportation needs - non-medical: None  Occupational History    Comment: MAssociate Professorat  [employer] . Smoking status: Former Smoker    Packs/day: 1.00    Years: 30.00    Pack years: 30.00    Types: Cigarettes    Last attempt to quit: 07/25/2000    Years since quitting: 17.0  . Smokeless tobacco: Never Used  . Tobacco comment: Quit smoking in 2003  Substance and Sexual Activity  . Alcohol use: No    Alcohol/week: 0.6 oz    Types: 1 Shots of liquor per week    Comment: stopped drinking alcohol  . Drug use:  No  . Sexual activity: Yes  Other Topics Concern  . None  Social History Narrative   Lives in [place], [place] with wife.    Family History  Problem Relation Age of Onset  . Hypertension Father   . Diabetes Father   . Heart disease Father   . COPD Sister   . Cancer Maternal Aunt        Breast cancer   . Cancer Maternal Aunt        Breast cancer   . Diabetes Son   . Cancer Daughter        Cervical cancer  . Coronary artery disease Unknown        FAMILY HISTORY   Scheduled Meds: . allopurinol  100 mg Oral Daily  . atorvastatin  20 mg Oral q1800  . ferrous gluconate  324 mg Oral TID WC  . gabapentin  300 mg Oral QHS  . insulin aspart  0-15 Units Subcutaneous TID WC  . insulin aspart  0-5 Units Subcutaneous QHS  . [START ON 08/26/2017] metolazone  5 mg Oral Q72H  . metoprolol succinate  25 mg Oral BID  . pantoprazole (PROTONIX) IV  40 mg Intravenous Q12H  . sertraline  50 mg Oral Daily  . tamsulosin  0.4 mg Oral QPC supper  . torsemide  120 mg Oral BID  . vitamin C  500 mg Oral BID   Continuous Infusions: . sodium chloride    . sodium chloride    . sodium chloride    . cefTRIAXone (ROCEPHIN)  IV    . octreotide  (SANDOSTATIN)    IV infusion 50 mcg/hr (08/25/17 0932)   PRN Meds:.acetaminophen **OR** acetaminophen, ondansetron **OR** ondansetron (ZOFRAN) IV Medications Prior to Admission:  Prior to Admission medications   Medication Sig Start Date End Date Taking? Authorizing Provider  acetaminophen (TYLENOL) 500 MG tablet Take 1,000 mg by  mouth at bedtime as needed for mild pain or headache.    Yes [provider]  allopurinol (ZYLOPRIM) 100 MG tablet Take 1 tablet (100 mg total) by mouth daily. 04/18/17  Yes Larey Dresser, MD  atorvastatin (LIPITOR) 20 MG tablet Take 20 mg by mouth daily at 6 PM.    Yes [provider]  colchicine 0.6 MG tablet Take 0.6 mg by mouth daily.    Yes [provider]  ferrous gluconate (FERGON) 324 MG tablet Take 324 mg by mouth 3 (three) times daily with meals.    Yes [provider]  gabapentin (NEURONTIN) 300 MG capsule Take 300 mg by mouth 3 (three) times daily. 12/31/14  Yes [provider]  insulin NPH Human (HUMULIN N,NOVOLIN N) 100 UNIT/ML injection Inject 65 Units into the skin 2 (two) times daily before a meal.    Yes [provider]  insulin regular (NOVOLIN R,HUMULIN R) 100 units/mL injection Inject 30 Units into the skin 2 (two) times daily before a meal.    Yes [provider]  metolazone (ZAROXOLYN) 5 MG tablet Take 1 tablet (5 mg total) by mouth every 3 (three) days. 07/13/17  Yes Larey Dresser, MD  metoprolol succinate (TOPROL-XL) 100 MG 24 hr tablet Take 1 tablet (100 mg total) by mouth 2 (two) times daily. 05/17/17  Yes Larey Dresser, MD  Multiple Vitamins-Minerals ([employer] SPECTRAVITE ADULT 50+ PO) Take 1 tablet by mouth daily.   Yes [provider]  omeprazole (PRILOSEC) 20 MG capsule Take 1 capsule (20 mg total) by mouth 2 (two) times daily  before a meal. 10/04/14  Yes Wert, Christena Deem, MD  potassium chloride SA (K-DUR,KLOR-CON) 20 MEQ tablet Take 2 tablets (40 mEq total) by mouth 2 (two) times daily. 07/13/17  Yes Larey Dresser, MD  PROAIR HFA [phone] Base) MCG/ACT inhaler Take 2 puffs by mouth 2 (two) times daily. 03/28/17  Yes [provider]  sertraline (ZOLOFT) 50 MG tablet Take 1 tablet (50 mg total) by mouth daily. 07/11/17  Yes Larey Dresser, MD  spironolactone (ALDACTONE) 50 MG tablet Take 1  tablet (50 mg total) by mouth 2 (two) times daily. 04/25/17  Yes Larey Dresser, MD  tamsulosin (FLOMAX) 0.4 MG CAPS capsule TAKE ONE CAPSULE BY MOUTH EVERY DAY AFTER SUPPER 07/11/17  Yes Larey Dresser, MD  torsemide (DEMADEX) 20 MG tablet Take 6 tablets (120 mg total) by mouth 2 (two) times daily. 06/29/17  Yes Larey Dresser, MD  vitamin C (ASCORBIC ACID) 500 MG tablet Take 500 mg by mouth 2 (two) times daily.   Yes [provider]   Allergies  Allergen Reactions  . Codeine Other (See Comments) and Palpitations    Hurting in chest  . Diltiazem Hcl Itching  . Niacin Other (See Comments)    Felt a "severe burning sensation" Hot flashes  . Aspirin     History of Bleeding ulcers   . Diltiazem Hcl Other (See Comments) and Hives    Gets hot   Review of Systems  Unable to perform ROS: Acuity of condition    Physical Exam  Constitutional: He is oriented to person, place, and time. No distress.  Appears acutely/chronically ill.  Makes and keeps eye contact, calm and cooperative  HENT:  Head: Normocephalic and atraumatic.  Cardiovascular: Normal rate.  Pulmonary/Chest: Effort normal. No respiratory distress.  Abdominal: Soft. He exhibits distension. There is no guarding.  Musculoskeletal: He exhibits no edema.  Neurological: He is alert and oriented to person, place, and time.  Skin: Skin is warm and dry.  Psychiatric: He has a normal mood and affect. His behavior is normal. Judgment and thought content normal.  Nursing note and vitals reviewed.   Vital Signs: BP (!) 100/57   Pulse 75   Temp 98.1 F (36.7 C) (Oral)   Resp 13   Ht _0  (1.727 m)   Wt 124.8 kg (275 lb 2.2 oz)   SpO2 95%   BMI 41.83 kg/m  Pain Assessment: No/denies pain   Pain Score: 0-No pain   SpO2: SpO2: 95 % O2 Device:SpO2: 95 % O2 Flow Rate: .   IO: Intake/output summary:   Intake/Output Summary (Last 24 hours) at 08/25/2017 1057 Last data filed at 08/25/2017 1000 Gross per 24 hour    Intake 1397.67 ml  Output 1200 ml  Net 197.67 ml    LBM: Last BM Date: 08/24/17 Baseline Weight: Weight: 126.1 kg (278 lb) Most recent weight: Weight: 124.8 kg (275 lb 2.2 oz)     Palliative Assessment/Data:   Flowsheet Rows     Most Recent Value  Intake Tab  Referral Department  Hospitalist  Unit at Time of Referral  ICU  Palliative Care Primary Diagnosis  Other (Comment) [GI bleed]  Date Notified  08/24/17  Palliative Care Type  New Palliative care  Reason for referral  Clarify Goals of Care, Advance Care Planning  Date of Admission  08/24/17  Date first seen by Palliative Care  08/25/17  # of days Palliative referral response time  1 Day(s)  # of  days IP prior to Palliative referral  0  Clinical Assessment  Palliative Performance Scale Score  50%  Pain Max last 24 hours  Not able to report  Pain Min Last 24 hours  Not able to report  Dyspnea Max Last 24 Hours  Not able to report  Dyspnea Min Last 24 hours  Not able to report  Psychosocial & Spiritual Assessment  Palliative Care Outcomes  Patient/Family meeting held?  Yes  Who was at the meeting?  patient and daughter Christal at bedside.   Palliative Care Outcomes  Counseled regarding hospice, Provided advance care planning, Clarified goals of care, Provided psychosocial or spiritual support  Patient/Family wishes: Interventions discontinued/not started   Mechanical Ventilation, Other (Comment) [no shock or CPR]      Time In: 0920 Time Out: 1040 Time Total: 80 minutes Greater than 50%  of this time was spent counseling and coordinating care related to the above assessment and plan.  Signed by: Drue Novel, NP   Please contact Palliative Medicine Team phone at [phone] for questions and concerns.  For individual provider: See Shea Evans

## 2017-08-25 NOTE — Progress Notes (Signed)
PROGRESS NOTE  [name] NKN:[id number] DOB: 1946-01-05 DOA: 08/24/2017 PCP: [name], MD  Brief History:   72 y.o. 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  Patient denies any fevers, chills, sore throat, coughing, headache, neck pain, chest pain, nausea, vomiting, diarrhea, abdominal pain.  He last had paracentesis approximately 2 weeks prior to this admission on 08/10/2017.  The patient went to see his primary care provider on 08/24/2017.  Because of his dyspnea on exertion, he was advised to go to emergency department for further evaluation.  The patient denies any NSAID use, but states that he has been having melanotic stools for the last 2 weeks.  He denies any hematochezia or hematemesis or hematuria.  He endorses compliance with all his medications, but states that he has had dietary indiscretion with regard to salt intake.  He denies any worsening lower extremity edema or orthopnea.  However, he has noted increasing abdominal distention since his last paracentesis.  In the emergency department, the patient was noted to have soft systolic blood pressures in the 90s and low 100s.  Sodium was 122 with potassium 5.1.  LFTs were unremarkable.  WBC was 14.7 with hemoglobin 7.0.  2 units PRBCs were ordered. EKG showed AV paced rhythm.  Chest x-ray showed pulmonary interstitial markings.     Assessment/Plan: Acute blood loss anemia/symptomatic anemia/ upper GI bleed -Concerned about variceal bleed vs GAVE -Continue Protonix IV twice daily -Continue octreotide -Transfused 2 units PRBC-->Hgb 7.0 up to 8.5 -GI consult appreciated -08/25/17--EGD--grade 1 distal esophageal varices, mild portal hypertensive gastropathy, sessile polyps in the stomach without  stigmata of bleeding. -Clear liquid diet  Acute on chronic renal failure--CKD4 -Likely secondary to hypovolemia -baseline creatinine 2.4-2.8 -monitor BMP  Elevated troponin/CAD -Likely secondary to demand ischemia in the setting of worsening renal function -No chest pain presently -Trend troponins--trend is flat -continue atorvastatin -he is s/p CABG in 2004 with cath in 2013 showing patent LIMA-LAD, Free Radial-OM, and SVG-D1. He has baseline dyspnea on exertion but denies any recent chest pain.    Chronic diastolic CHF -3/79/0240 echo EF 60-65%, grade 2 DD, no WMA, mild MR -Daily weights--down 2 lbs since admit -08/10/2017 weight 277 -Continue home dose torsemide and metolazone -Holding Aldactone secondary to increasing potassium and worsen serum creatinine -Holding potassium supplementation secondary to increasing potassium   Hyponatremia -This has been chronic in the setting of CHF and liver cirrhosis -Acute worsening secondary to volume depletion -Baseline sodium 128-131 -A.m. BMP  Chronic atrial fibrillation -Currently in a paced rhythm -Status post AVN ablation with PPM -CHADS-VASc 4, but not AC candidate due to GI bleeding -continue lower dose metoprolol succinate secondary to soft blood pressures  Liver cirrhosis with ascites -A combination of [place] versus cardiac related liver cirrhosis -08/25/17 ultrasound-guided paracentesis--4L removed  Diabetes mellitus type 2 -NovoLog sliding scale for now in light of acute on chronic renal failure -Holding home dose NPH and monitor CBGs -check A1C     Disposition Plan:   Home vs SNF in 2-3 days  Family Communication:   Daughter updated at bedside  Consultants:  GI--Rehman, cardiology  Code Status:  DNR  DVT Prophylaxis:  SCDs   Procedures: As Listed in Progress Note Above  Antibiotics: Ceftriaxone 2/1>>>  Subjective: Patient denies fevers, chills, headache, chest pain, dyspnea, nausea, vomiting,  diarrhea, abdominal pain, dysuria, hematuria, hematochezia, and melena.   Objective: Vitals:   08/25/17 1250 08/25/17 1300 08/25/17 1310 08/25/17 1400  BP:  105/60 104/62 125/84  Pulse:  74  76  Resp:  11  16  Temp:      TempSrc:      SpO2: 100% 100%  93%  Weight:      Height:        Intake/Output Summary (Last 24 hours) at 08/25/2017 1525 Last data filed at 08/25/2017 1300 Gross per 24 hour  Intake 1547.67 ml  Output 1200 ml  Net 347.67 ml   Weight change:  Exam:   General:  Pt is alert, follows commands appropriately, not in acute distress  HEENT: No icterus, No thrush, No neck mass, [place]/AT  Cardiovascular: RRR, S1/S2, no rubs, no gallops  Respiratory: Bibasilar crackles.  No wheezing.  Good air movement.  Abdomen: Soft/+BS, non tender, non distended, no guarding  Extremities: trace LE edema, No lymphangitis, No petechiae, No rashes, no synovitis   Data Reviewed: I have personally reviewed following labs and imaging studies Basic Metabolic Panel: Recent Labs  Lab 08/24/17 1404 08/25/17 0938  NA 122* 123*  K 5.1 4.4  CL 89* 89*  CO2 17* 19*  GLUCOSE 225* 170*  BUN 216* 110*  CREATININE 3.52* 3.17*  CALCIUM 8.8* 8.8*   Liver Function Tests: Recent Labs  Lab 08/24/17 1404  AST 23  ALT 13*  ALKPHOS 62  BILITOT 0.7  PROT 6.7  ALBUMIN 3.2*   No results for input(s): LIPASE, AMYLASE in the last 168 hours. No results for input(s): AMMONIA in the last 168 hours. Coagulation Profile: Recent Labs  Lab 08/24/17 1404  INR 1.12   CBC: Recent Labs  Lab 08/24/17 1404 08/25/17 0938  WBC 14.7* 11.3*  NEUTROABS 12.4  --   HGB 7.0* 8.5*  HCT 24.2* 28.0*  MCV 85.8 84.3  PLT 192 172   Cardiac Enzymes: Recent Labs  Lab 08/24/17 1404 08/24/17 2004 08/25/17 0938  TROPONINI 0.16* 0.13* 0.14*   BNP: Invalid input(s): POCBNP CBG: Recent Labs  Lab 08/24/17 2107 08/25/17 0753 08/25/17 1103 08/25/17 1239 08/25/17 1357  GLUCAP 219* 139* 160* 153*  146*   HbA1C: No results for input(s): HGBA1C in the last 72 hours. Urine analysis:    Component Value Date/Time   COLORURINE YELLOW 08/25/2017 0842   APPEARANCEUR CLEAR 08/25/2017 0842   LABSPEC 1.009 08/25/2017 0842   PHURINE 5.0 08/25/2017 0842   GLUCOSEU NEGATIVE 08/25/2017 0842   HGBUR NEGATIVE 08/25/2017 0842   BILIRUBINUR NEGATIVE 08/25/2017 0842   KETONESUR NEGATIVE 08/25/2017 0842   PROTEINUR NEGATIVE 08/25/2017 0842   UROBILINOGEN 0.2 04/17/2012 1430   NITRITE NEGATIVE 08/25/2017 0842   LEUKOCYTESUR NEGATIVE 08/25/2017 0842   Sepsis Labs: @LABRCNTIP (procalcitonin:4,lacticidven:4) ) Recent Results (from the past 240 hour(s))  MRSA PCR Screening     Status: None   Collection Time: 08/24/17  5:24 PM  Result Value Ref Range Status   MRSA by PCR NEGATIVE NEGATIVE Final    Comment:        The GeneXpert MRSA Assay (FDA approved for NASAL specimens only), is one component of a comprehensive MRSA colonization surveillance program. It is not intended to diagnose MRSA infection nor to guide or monitor treatment for MRSA infections.      Scheduled Meds: . allopurinol  100 mg Oral Daily  . atorvastatin  20 mg Oral q1800  .  ferrous gluconate  324 mg Oral TID WC  . gabapentin  300 mg Oral QHS  . insulin aspart  0-15 Units Subcutaneous TID WC  . insulin aspart  0-5 Units Subcutaneous QHS  . [START ON 08/26/2017] metolazone  5 mg Oral Q72H  . metoprolol succinate  25 mg Oral BID  . pantoprazole (PROTONIX) IV  40 mg Intravenous Q12H  . sertraline  50 mg Oral Daily  . tamsulosin  0.4 mg Oral QPC supper  . torsemide  120 mg Oral BID  . vitamin C  500 mg Oral BID   Continuous Infusions: . sodium chloride    . sodium chloride    . sodium chloride    . albumin human    . cefTRIAXone (ROCEPHIN)  IV 1 g (08/25/17 1414)  . octreotide  (SANDOSTATIN)    IV infusion 50 mcg/hr (08/25/17 0932)    Procedures/Studies: Dg Chest 2 View  Result Date: 08/24/2017 CLINICAL DATA:   Pt sent over for low hemoglobin 7.3. States dark tarry stool x1 week and generalized weakness,COPD, CHF EXAM: CHEST  2 VIEW COMPARISON:  06/01/2017 FINDINGS: Patient's left-sided transvenous pacemaker with leads to the right ventricle. Status post median sternotomy. The heart is enlarged. There is mild prominence of interstitial markings, stable in appearance. There are no focal consolidations. There is right costophrenic angle blunting. IMPRESSION: 1. Cardiomegaly. 2. Suspect mild interstitial pulmonary edema. Electronically Signed   By: Nolon Nations M.D.   On: 08/24/2017 14:12   US Paracentesis  Result Date: 08/11/2017 INDICATION: Recurrent ascites EXAM: ULTRASOUND-GUIDED PARACENTESIS COMPARISON:  Previous paracentesis MEDICATIONS: 10 cc 1% lidocaine COMPLICATIONS: None immediate. TECHNIQUE: Informed written consent was obtained from the patient after a discussion of the risks, benefits and alternatives to treatment. A timeout was performed prior to the initiation of the procedure. Initial ultrasound scanning demonstrates a large amount of ascites within the right lower abdominal quadrant. The right lower abdomen was prepped and draped in the usual sterile fashion. 1% lidocaine with epinephrine was used for local anesthesia. Under direct ultrasound guidance, a 19 gauge, 7-cm, Yueh catheter was introduced. An ultrasound image was saved for documentation purposed. The paracentesis was performed. The catheter was removed and a dressing was applied. The patient tolerated the procedure well without immediate post procedural complication. FINDINGS: A total of approximately 3.6 liters of light amber fluid was removed. Samples were sent to the laboratory as requested by the clinical team. IMPRESSION: Successful ultrasound-guided paracentesis yielding 3.6 liters of peritoneal fluid. Read by Lavonia Drafts [facility] Electronically Signed   By: Marijo Conception, M.D.   On: 08/11/2017 14:52    Matthew Eva, DO  [facility] Pager [phone]  If 7PM-7AM, please contact night-coverage [email or web address] Password [email or web address] 08/25/2017, 3:25 PM   LOS: 1 day

## 2017-08-25 NOTE — Progress Notes (Signed)
Brief EGD note.  2 short columns of grade 1 esophageal varices. Portal hypertensive gastropathy. Scattered small polyps in the gastric fundus body and antrum without stigmata of bleeding.  No bleeding lesion identified on this exam.

## 2017-08-25 NOTE — Progress Notes (Signed)
Per Dr. Olevia Perches verbal order and care order/instruction: to send Ascitic fluid to lab: Spoke with Ed Agundiz in lab regarding lab orders and gave okay for him to put in orders.

## 2017-08-25 NOTE — Transfer of Care (Signed)
Immediate Anesthesia Transfer of Care Note  Patient: [name]  Procedure(s) Performed: ESOPHAGOGASTRODUODENOSCOPY (EGD) WITH PROPOFOL (N/A )  Patient Location: PACU  Anesthesia Type:MAC  Level of Consciousness: awake and patient cooperative  Airway & Oxygen Therapy: Patient Spontanous Breathing and non-rebreather face mask  Post-op Assessment: Report given to RN and Post -op Vital signs reviewed and stable  Post vital signs: Reviewed and stable  Last Vitals:  Vitals:   08/25/17 1155 08/25/17 1200  BP:  119/61  Pulse:    Resp: (!) 59 (!) 29  Temp:    SpO2: 100% 100%    Last Pain:  Vitals:   08/25/17 1140  TempSrc: Oral  PainSc:          Complications: No apparent anesthesia complications

## 2017-08-25 NOTE — Procedures (Signed)
PreOperative Dx: Cirrhosis, ascites Postoperative Dx: Cirrhosis, ascites Procedure:   US guided paracentesis Radiologist:  Karolee Meloni Anesthesia:  10 ml of1% lidocaine Specimen:  4.2 L of yellow ascitic fluid EBL:   < 1 ml Complications: None  

## 2017-08-25 NOTE — Progress Notes (Signed)
Per Dr. Olevia Perches verbal order, will titrate down octreotide by 12.5 mcg/hr and discontinue after 4 hours. Will be discontinued at 2015

## 2017-08-25 NOTE — Progress Notes (Signed)
EGD findings reviewed with patient and his daughter as well as other family members. No bleeding lesion identified on this exam. Suspect chronic GI bleed from small bowel angiodysplasia. Patient is agreeable to proceed with small bowel given capsule study. Study would be performed tomorrow.  Patient underwent LVAP removal of 4.2 L of fluid. Will send fluid for routine studies.  Taper and discontinue octreotide today.  Dr. Oneida Alar will be seeing the patient over the weekend.

## 2017-08-25 NOTE — Op Note (Addendum)
[facility] Patient Name: [name] Procedure Date: 08/25/2017 11:56 AM MRN: [id number] Date of Birth: 11-Nov-1945 Attending MD: Hildred Laser , MD CSN: [id number] Age: 72 Admit Type: Inpatient Procedure:                Upper GI endoscopy Indications:              Acute post hemorrhagic anemia Providers:                Hildred Laser, MD, Otis Peak B. Sharon Seller, RN, Randa Spike, Technician Referring MD:              Medicines:                Lidocaine spray, Propofol per Anesthesia Complications:            No immediate complications. Estimated Blood Loss:     Estimated blood loss: none. Procedure:                Pre-Anesthesia Assessment:                           - Prior to the procedure, a History and Physical                            was performed, and patient medications and                            allergies were reviewed. The patient's tolerance of                            previous anesthesia was also reviewed. The risks                            and benefits of the procedure and the sedation                            options and risks were discussed with the patient.                            All questions were answered, and informed consent                            was obtained. Prior Anticoagulants: The patient has                            taken no previous anticoagulant or antiplatelet                            agents. ASA Grade Assessment: IV - A patient with                            severe systemic disease that is a constant threat  to life. After reviewing the risks and benefits,                            the patient was deemed in satisfactory condition to                            undergo the procedure.                           After obtaining informed consent, the endoscope was                            passed under direct vision. Throughout the                            procedure, the  patient's blood pressure, pulse, and                            oxygen saturations were monitored continuously. The                            EG-[id number] ([id number]) scope was introduced through the                            mouth, and advanced to the second part of duodenum.                            The upper GI endoscopy was accomplished without                            difficulty. The patient tolerated the procedure                            well. Scope In: 12:18:25 PM Scope Out: 12:24:46 PM Total Procedure Duration: 0 hours 6 minutes 21 seconds  Findings:      The proximal esophagus and mid esophagus were normal.      Grade I varices were found in the distal esophagus.      The Z-line was irregular and was found 45 cm from the incisors.      Moderate portal hypertensive gastropathy was found in the gastric       fundus, in the gastric body and in the gastric antrum.      A few small sessile polyps with no stigmata of recent bleeding were       found in the gastric fundus, in the gastric body and in the prepyloric       region of the stomach.      The duodenal bulb and second portion of the duodenum were normal. Impression:               - Normal proximal esophagus and mid esophagus.                           - Grade I esophageal varices.                           -  Z-line irregular, 45 cm from the incisors.                           - Portal hypertensive gastropathy.                           - A few gastric polyps documented to be                            hyperplastic polyps on EGD of April, 2019.                           - Normal duodenal bulb and second portion of the                            duodenum.                           - No specimens collected. Moderate Sedation:      Per Anesthesia Care Recommendation:           - Return patient to ICU for ongoing care.                           - Clear liquid diet today.                           - Continue present  medications.                           - Small bowel Given capsule study in am if patient                            and family want to proceed with further workup. Procedure Code(s):        --- Professional ---                           [phone], Esophagogastroduodenoscopy, flexible,                            transoral; diagnostic, including collection of                            specimen(s) by brushing or washing, when performed                            (separate procedure) Diagnosis Code(s):        --- Professional ---                           I85.00, Esophageal varices without bleeding                           K22.8, Other specified diseases of esophagus                           K76.6, Portal hypertension  K31.89, Other diseases of stomach and duodenum                           K31.7, Polyp of stomach and duodenum                           D62, Acute posthemorrhagic anemia CPT copyright 2016 American Medical Association. All rights reserved. The codes documented in this report are preliminary and upon coder review may  be revised to meet current compliance requirements. Hildred Laser, MD Hildred Laser, MD 08/25/2017 12:36:29 PM This report has been signed electronically. Number of Addenda: 0

## 2017-08-25 NOTE — Progress Notes (Signed)
CRITICAL VALUE ALERT  Critical Value:  Troponin 0.14  Date & Time Notied:  08/25/17 1130  Provider Notified: Dr. Carles Collet  Orders Received/Actions taken: No orders at this time

## 2017-08-25 NOTE — Anesthesia Postprocedure Evaluation (Signed)
Anesthesia Post Note  Patient: [name]  Procedure(s) Performed: ESOPHAGOGASTRODUODENOSCOPY (EGD) WITH PROPOFOL (N/A )  Patient location during evaluation: PACU Anesthesia Type: MAC Level of consciousness: awake and alert and patient cooperative Pain management: satisfactory to patient Vital Signs Assessment: post-procedure vital signs reviewed and stable Respiratory status: spontaneous breathing and non-rebreather facemask Cardiovascular status: stable Postop Assessment: no apparent nausea or vomiting Anesthetic complications: no     Last Vitals:  Vitals:   08/25/17 1200 08/25/17 1233  BP: 119/61 104/65  Pulse:  75  Resp: (!) 29 15  Temp:  36.6 C  SpO2: 100% 100%    Last Pain:  Vitals:   08/25/17 1233  TempSrc:   PainSc: Asleep                 Gurveer Colucci

## 2017-08-25 NOTE — Anesthesia Procedure Notes (Signed)
Procedure Name: MAC Date/Time: 08/25/2017 12:08 PM Performed by: Vista Deck, CRNA Pre-anesthesia Checklist: Patient identified, Emergency Drugs available, Suction available, Timeout performed and Patient being monitored Patient Re-evaluated:Patient Re-evaluated prior to induction Oxygen Delivery Method: Non-rebreather mask

## 2017-08-25 NOTE — Progress Notes (Signed)
New skin tear noted once patient received from PACU

## 2017-08-26 ENCOUNTER — Encounter (HOSPITAL_COMMUNITY): Payer: Self-pay | Admitting: *Deleted

## 2017-08-26 ENCOUNTER — Encounter (HOSPITAL_COMMUNITY)
Admission: EM | Disposition: A | Discharge status: Home / Self Care | Payer: Self-pay | Source: Home / Self Care | Attending: Internal Medicine

## 2017-08-26 HISTORY — PX: GIVENS CAPSULE STUDY: SHX5432

## 2017-08-26 LAB — CBC
HEMATOCRIT: 25.9 % — AB (ref 39.0–52.0)
HEMOGLOBIN: 7.6 g/dL — AB (ref 13.0–17.0)
MCH: 25 pg — AB (ref 26.0–34.0)
MCHC: 29.3 g/dL — ABNORMAL LOW (ref 30.0–36.0)
MCV: 85.2 fL (ref 78.0–100.0)
Platelets: 140 10*3/uL — ABNORMAL LOW (ref 150–400)
RBC: 3.04 MIL/uL — AB (ref 4.22–5.81)
RDW: 18.5 % — ABNORMAL HIGH (ref 11.5–15.5)
WBC: 9.1 10*3/uL (ref 4.0–10.5)

## 2017-08-26 LAB — URINE CULTURE: CULTURE: NO GROWTH

## 2017-08-26 LAB — GLUCOSE, CAPILLARY
GLUCOSE-CAPILLARY: 150 mg/dL — AB (ref 65–99)
GLUCOSE-CAPILLARY: 136 mg/dL — AB (ref 65–99)
Glucose-Capillary: 229 mg/dL — ABNORMAL HIGH (ref 65–99)

## 2017-08-26 LAB — PREPARE RBC (CROSSMATCH)

## 2017-08-26 LAB — BASIC METABOLIC PANEL
Anion gap: 16 — ABNORMAL HIGH (ref 5–15)
BUN: 105 mg/dL — AB (ref 6–20)
CHLORIDE: 89 mmol/L — AB (ref 101–111)
CO2: 20 mmol/L — AB (ref 22–32)
Calcium: 8.8 mg/dL — ABNORMAL LOW (ref 8.9–10.3)
Creatinine, Ser: 2.86 mg/dL — ABNORMAL HIGH (ref 0.61–1.24)
GFR calc Af Amer: 24 mL/min — ABNORMAL LOW (ref >60)
GFR calc non Af Amer: 21 mL/min — ABNORMAL LOW (ref >60)
Glucose, Bld: 151 mg/dL — ABNORMAL HIGH (ref 65–99)
POTASSIUM: 3.8 mmol/L (ref 3.5–5.1)
SODIUM: 125 mmol/L — AB (ref 135–145)

## 2017-08-26 LAB — MAGNESIUM: MAGNESIUM: 2.3 mg/dL (ref 1.7–2.4)

## 2017-08-26 SURGERY — IMAGING PROCEDURE, GI TRACT, INTRALUMINAL, VIA CAPSULE

## 2017-08-26 MED ORDER — SPIRONOLACTONE 25 MG PO TABS
25.0000 mg | ORAL_TABLET | Freq: Every day | ORAL | Status: DC
  Administered 2017-08-26 – 2017-08-27 (×2): 25 mg via ORAL
  Filled 2017-08-26 (×2): qty 1

## 2017-08-26 MED ORDER — SODIUM CHLORIDE 0.9 % IV SOLN: INTRAVENOUS | Status: DC

## 2017-08-26 MED ORDER — SODIUM CHLORIDE 0.9 % IV SOLN: Freq: Once | INTRAVENOUS | Status: DC

## 2017-08-26 NOTE — Progress Notes (Addendum)
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 Patient denies any fevers, chills, sore throat, coughing, headache, neck pain, chest pain, nausea, vomiting, diarrhea, abdominal pain. He last had paracentesis approximately 2 weeks prior to this admission on 08/10/2017. The patient went to see his primary care provider on 08/24/2017. Because of his dyspnea on exertion, he was advised to go to emergency department for further evaluation. The patient denies any NSAID use, but states that he has been having melanotic stools for the last 2 weeks. He denies any hematochezia or hematemesis or hematuria. He endorses compliance with all his medications, but states that he has had dietary indiscretion with regard to salt intake. He denies any worsening lower extremity edema or orthopnea. However, he has noted increasing abdominal distention since his last paracentesis.  In the emergency department, the patient was noted to have soft systolic blood pressures in the 90s and low 100s. Sodium was 122 with potassium 5.1. LFTs were unremarkable. WBC was 14.7 with hemoglobin 7.0. 2 units PRBCs were ordered.EKG showed AV paced rhythm. Chest x-ray showed pulmonary interstitial markings.     Assessment/Plan: Acute blood loss anemia/symptomatic anemia/upper GI bleed -Concerned about variceal bleed vs GAVE -Continue Protonix IV twice daily -Transfused 2 units PRBC-->Hgb 7.0 up to 8.5 -GI consult appreciated -08/25/17--EGD--grade 1 distal esophageal varices, mild portal hypertensive gastropathy, sessile polyps in the stomach without stigmata of  bleeding. -Discontinue octreotide -Clear liquid diet -08/26/17--capsule endo -08/26/17-transfuse 1 additional unit PRBC  Acute on chronic renal failure--CKD4 -Likely secondary tohypovolemia -baseline creatinine 2.4-2.8 -improved with fluid and PRBC -monitor BMP  Elevated troponin/CAD -Likely secondary to demand ischemia in the setting of worsening renal function -No chest pain presently -Trend troponins--trend is flat -continue atorvastatin -he iss/p CABG in 2004 with cath in 2013 showing patent LIMA-LAD, Free Radial-OM, and SVG-D1. He has baseline dyspnea on exertion but denies any recent chest pain.    Chronic diastolic CHF -01/25/5008 echo EF 60-65%, grade 2 DD, no WMA, mild MR -Daily weights--down 2 lbs since admit -08/10/2017 weight 277 -Continue home dose torsemide and metolazone -restart aldactone at lower dose -Holding potassium supplementation secondary to increasing potassium   Hyponatremia -This has been chronic in the setting of CHF and liver cirrhosis -Acute worsening secondary to volume depletion -Baseline sodium 128-131 -A.m. BMP  Chronic atrial fibrillation -Currently in a paced rhythm -Status post AVN ablation with PPM -CHADS-VASc 4, but not AC candidate due to GI bleeding -continuelower dose metoprolol succinate secondary to soft blood pressures  Liver cirrhosis with ascites -A combination of [place] versus cardiac related liver cirrhosis -08/25/17 ultrasound-guided paracentesis--4.2L removed  Diabetes mellitus type 2 -NovoLog sliding scale for now in light of acute on chronic renal failure -Holding home dose NPH and monitor CBGs -check A1C     Disposition Plan:   Home vs SNF in 1-2 days  Family Communication:   wife updated at bedside 08/26/17  Consultants:  GI--Rehman, cardiology  Code Status:  DNR  DVT Prophylaxis:  SCDs   Procedures: As Listed in Progress Note Above  Antibiotics: Ceftriaxone  2/1>>>     Subjective: Patient denies fevers, chills, headache, chest pain, dyspnea, nausea, vomiting, diarrhea, abdominal  pain, dysuria, hematuria, hematochezia, and melena.   Objective: Vitals:   08/26/17 0000 08/26/17 0400 08/26/17 0749 08/26/17 1121  BP:   (!) 114/54   Pulse:    75  Resp:   19 17  Temp: 99.7 F (37.6 C) 98.4 F (36.9 C) (!) 97.2 F (36.2 C) 97.6 F (36.4 C)  TempSrc: Oral Oral Oral Oral  SpO2:    96%  Weight:  119.7 kg (263 lb 14.3 oz)    Height:        Intake/Output Summary (Last 24 hours) at 08/26/2017 1238 Last data filed at 08/25/2017 2200 Gross per 24 hour  Intake 303.24 ml  Output 1000 ml  Net -696.76 ml   Weight change: -6.4 kg (-1.8 oz) Exam:   General:  Pt is alert, follows commands appropriately, not in acute distress  HEENT: No icterus, No thrush, No neck mass, [place]/AT  Cardiovascular: RRR, S1/S2, no rubs, no gallops  Respiratory: CTA bilaterally, no wheezing, no crackles, no rhonchi  Abdomen: Soft/+BS, non tender, non distended, no guarding  Extremities: trace LE edema, No lymphangitis, No petechiae, No rashes, no synovitis   Data Reviewed: I have personally reviewed following labs and imaging studies Basic Metabolic Panel: Recent Labs  Lab 08/24/17 1404 08/25/17 0938 08/26/17 0441  NA 122* 123* 125*  K 5.1 4.4 3.8  CL 89* 89* 89*  CO2 17* 19* 20*  GLUCOSE 225* 170* 151*  BUN 216* 110* 105*  CREATININE 3.52* 3.17* 2.86*  CALCIUM 8.8* 8.8* 8.8*  MG  --   --  2.3   Liver Function Tests: Recent Labs  Lab 08/24/17 1404  AST 23  ALT 13*  ALKPHOS 62  BILITOT 0.7  PROT 6.7  ALBUMIN 3.2*   No results for input(s): LIPASE, AMYLASE in the last 168 hours. No results for input(s): AMMONIA in the last 168 hours. Coagulation Profile: Recent Labs  Lab 08/24/17 1404  INR 1.12   CBC: Recent Labs  Lab 08/24/17 1404 08/25/17 0938 08/26/17 0441  WBC 14.7* 11.3* 9.1  NEUTROABS 12.4  --   --   HGB 7.0* 8.5* 7.6*  HCT  24.2* 28.0* 25.9*  MCV 85.8 84.3 85.2  PLT 192 172 140*   Cardiac Enzymes: Recent Labs  Lab 08/24/17 1404 08/24/17 2004 08/25/17 0938 08/25/17 1603  TROPONINI 0.16* 0.13* 0.14* 0.13*   BNP: Invalid input(s): POCBNP CBG: Recent Labs  Lab 08/25/17 1357 08/25/17 1643 08/25/17 1658 08/25/17 2149 08/26/17 1120  GLUCAP 146* 138* 125* 124* 150*   HbA1C: Recent Labs    08/25/17 0938  HGBA1C 5.0   Urine analysis:    Component Value Date/Time   COLORURINE YELLOW 08/25/2017 [place] 08/25/2017 0842   LABSPEC 1.009 08/25/2017 0842   PHURINE 5.0 08/25/2017 0842   GLUCOSEU NEGATIVE 08/25/2017 0842   HGBUR NEGATIVE 08/25/2017 0842   BILIRUBINUR NEGATIVE 08/25/2017 0842   KETONESUR NEGATIVE 08/25/2017 0842   PROTEINUR NEGATIVE 08/25/2017 0842   UROBILINOGEN 0.2 04/17/2012 1430   NITRITE NEGATIVE 08/25/2017 0842   LEUKOCYTESUR NEGATIVE 08/25/2017 0842   Sepsis Labs: @LABRCNTIP (procalcitonin:4,lacticidven:4) ) Recent Results (from the past 240 hour(s))  MRSA PCR Screening     Status: None   Collection Time: 08/24/17  5:24 PM  Result Value Ref Range Status   MRSA by PCR NEGATIVE NEGATIVE Final    Comment:        The GeneXpert MRSA Assay (FDA approved for NASAL specimens only), is one component of a comprehensive MRSA colonization surveillance program.  It is not intended to diagnose MRSA infection nor to guide or monitor treatment for MRSA infections.   Culture, Urine     Status: None   Collection Time: 08/25/17  8:42 AM  Result Value Ref Range Status   Specimen Description   Final    URINE, CLEAN CATCH Performed at [facility], [address], [place], [place] [address]    Special Requests   Final    NONE Performed at [facility], [address], [place], [place] [address]    Culture   Final    NO GROWTH Performed at [facility], [place] [address] [place], [place] [address]    Report Status 08/26/2017 FINAL  Final  Stat Gram stain      Status: None   Collection Time: 08/25/17  4:34 PM  Result Value Ref Range Status   Specimen Description ASCITIC  Final   Special Requests NONE  Final   Gram Stain   Final    CYTOSPIN SMEAR NO ORGANISMS SEEN WBC PRESENT,BOTH PMN AND MONONUCLEAR Performed at [facility], [address], [place], [place] [address]    Report Status 08/25/2017 FINAL  Final  Culture, body fluid-bottle     Status: None (Preliminary result)   Collection Time: 08/25/17  4:34 PM  Result Value Ref Range Status   Specimen Description ASCITIC  Final   Special Requests BOTTLES DRAWN AEROBIC AND ANAEROBIC 10 CC EACH  Final   Culture   Final    NO GROWTH < 24 HOURS Performed at [facility], [address], [place], [place] [address]    Report Status PENDING  Incomplete     Scheduled Meds: . allopurinol  100 mg Oral Daily  . atorvastatin  20 mg Oral q1800  . gabapentin  300 mg Oral QHS  . insulin aspart  0-15 Units Subcutaneous TID WC  . insulin aspart  0-5 Units Subcutaneous QHS  . metolazone  5 mg Oral Q72H  . metoprolol succinate  25 mg Oral BID  . pantoprazole (PROTONIX) IV  40 mg Intravenous Q12H  . sertraline  50 mg Oral Daily  . tamsulosin  0.4 mg Oral QPC supper  . torsemide  120 mg Oral BID  . vitamin C  500 mg Oral BID   Continuous Infusions: . sodium chloride    . sodium chloride    . sodium chloride    . cefTRIAXone (ROCEPHIN)  IV Stopped (08/25/17 1444)  . octreotide  (SANDOSTATIN)    IV infusion Stopped (08/25/17 2015)    Procedures/Studies: Dg Chest 2 View  Result Date: 08/24/2017 CLINICAL DATA:  Pt sent over for low hemoglobin 7.3. States dark tarry stool x1 week and generalized weakness,COPD, CHF EXAM: CHEST  2 VIEW COMPARISON:  06/01/2017 FINDINGS: Patient's left-sided transvenous pacemaker with leads to the right ventricle. Status post median sternotomy. The heart is enlarged. There is mild prominence of interstitial markings, stable in appearance. There are no focal consolidations.  There is right costophrenic angle blunting. IMPRESSION: 1. Cardiomegaly. 2. Suspect mild interstitial pulmonary edema. Electronically Signed   By: Nolon Nations M.D.   On: 08/24/2017 14:12   US Paracentesis  Result Date: 08/25/2017 INDICATION: Cirrhosis, ascites EXAM: ULTRASOUND GUIDED THERAPEUTIC PARACENTESIS MEDICATIONS: None. COMPLICATIONS: None immediate. PROCEDURE: Procedure, benefits, and risks of procedure were discussed with patient. Written informed consent for procedure was obtained. Time out protocol followed. Adequate collection of ascites localized by ultrasound in LEFT lower quadrant. Skin prepped and draped in usual sterile fashion. Skin  and soft tissues anesthetized with 10 mL of 1% lidocaine. 5 [place] Yueh catheter placed into peritoneal cavity. 4.2 L of yellow ascitic fluid fluid aspirated by vacuum bottle suction. Procedure tolerated well by patient without immediate complication. FINDINGS: As above IMPRESSION: Successful ultrasound-guided paracentesis yielding 4.2 liters of peritoneal fluid. Electronically Signed   By: Lavonia Dana M.D.   On: 08/25/2017 16:10   US Paracentesis  Result Date: 08/11/2017 INDICATION: Recurrent ascites EXAM: ULTRASOUND-GUIDED PARACENTESIS COMPARISON:  Previous paracentesis MEDICATIONS: 10 cc 1% lidocaine COMPLICATIONS: None immediate. TECHNIQUE: Informed written consent was obtained from the patient after a discussion of the risks, benefits and alternatives to treatment. A timeout was performed prior to the initiation of the procedure. Initial ultrasound scanning demonstrates a large amount of ascites within the right lower abdominal quadrant. The right lower abdomen was prepped and draped in the usual sterile fashion. 1% lidocaine with epinephrine was used for local anesthesia. Under direct ultrasound guidance, a 19 gauge, 7-cm, Yueh catheter was introduced. An ultrasound image was saved for documentation purposed. The paracentesis was performed. The  catheter was removed and a dressing was applied. The patient tolerated the procedure well without immediate post procedural complication. FINDINGS: A total of approximately 3.6 liters of light amber fluid was removed. Samples were sent to the laboratory as requested by the clinical team. IMPRESSION: Successful ultrasound-guided paracentesis yielding 3.6 liters of peritoneal fluid. Read by Lavonia Drafts [facility] Electronically Signed   By: Marijo Conception, M.D.   On: 08/11/2017 14:52    Orson Eva, DO  [facility] Pager [phone]  If 7PM-7AM, please contact night-coverage [email or web address] Password [email or web address] 08/26/2017, 12:38 PM   LOS: 2 days

## 2017-08-27 DIAGNOSIS — D62 Acute posthemorrhagic anemia: Secondary | ICD-10-CM

## 2017-08-27 DIAGNOSIS — K922 Gastrointestinal hemorrhage, unspecified: Secondary | ICD-10-CM

## 2017-08-27 LAB — BASIC METABOLIC PANEL
Anion gap: 17 — ABNORMAL HIGH (ref 5–15)
BUN: 101 mg/dL — AB (ref 6–20)
CO2: 22 mmol/L (ref 22–32)
CREATININE: 2.7 mg/dL — AB (ref 0.61–1.24)
Calcium: 9.2 mg/dL (ref 8.9–10.3)
Chloride: 90 mmol/L — ABNORMAL LOW (ref 101–111)
GFR calc Af Amer: 26 mL/min — ABNORMAL LOW (ref >60)
GFR, EST NON AFRICAN AMERICAN: 22 mL/min — AB (ref >60)
GLUCOSE: 210 mg/dL — AB (ref 65–99)
POTASSIUM: 3.5 mmol/L (ref 3.5–5.1)
SODIUM: 129 mmol/L — AB (ref 135–145)

## 2017-08-27 LAB — CBC
HCT: 28.8 % — ABNORMAL LOW (ref 39.0–52.0)
Hemoglobin: 8.9 g/dL — ABNORMAL LOW (ref 13.0–17.0)
MCH: 25.6 pg — ABNORMAL LOW (ref 26.0–34.0)
MCHC: 30.9 g/dL (ref 30.0–36.0)
MCV: 83 fL (ref 78.0–100.0)
Platelets: 132 10*3/uL — ABNORMAL LOW (ref 150–400)
RBC: 3.47 MIL/uL — ABNORMAL LOW (ref 4.22–5.81)
RDW: 18.2 % — AB (ref 11.5–15.5)
WBC: 9.7 10*3/uL (ref 4.0–10.5)

## 2017-08-27 LAB — GLUCOSE, CAPILLARY
GLUCOSE-CAPILLARY: 190 mg/dL — AB (ref 65–99)
GLUCOSE-CAPILLARY: 302 mg/dL — AB (ref 65–99)

## 2017-08-27 LAB — MAGNESIUM: MAGNESIUM: 2.1 mg/dL (ref 1.7–2.4)

## 2017-08-27 MED ORDER — PANTOPRAZOLE SODIUM 40 MG PO TBEC: 40.0000 mg | DELAYED_RELEASE_TABLET | Freq: Two times a day (BID) | ORAL | 0 refills | Status: AC

## 2017-08-27 MED ORDER — CIPROFLOXACIN HCL 250 MG PO TABS: 250.0000 mg | ORAL_TABLET | Freq: Two times a day (BID) | ORAL | 0 refills | Status: DC

## 2017-08-27 MED ORDER — METOPROLOL SUCCINATE ER 25 MG PO TB24: 25.0000 mg | ORAL_TABLET | Freq: Two times a day (BID) | ORAL | 1 refills | Status: DC

## 2017-08-27 MED ORDER — CIPROFLOXACIN HCL 250 MG PO TABS: 250.0000 mg | ORAL_TABLET | Freq: Two times a day (BID) | ORAL | Status: DC

## 2017-08-27 MED ORDER — POTASSIUM CHLORIDE CRYS ER 20 MEQ PO TBCR: 40.0000 meq | EXTENDED_RELEASE_TABLET | Freq: Once | ORAL | Status: DC

## 2017-08-27 MED ORDER — PANTOPRAZOLE SODIUM 40 MG PO TBEC: 40.0000 mg | DELAYED_RELEASE_TABLET | Freq: Two times a day (BID) | ORAL | Status: DC

## 2017-08-27 NOTE — Progress Notes (Addendum)
Patient ID: [name], male   DOB: 04-18-1946, 72 y.o.   MRN: [id number]   Assessment/Plan: ADMITTED WITH ABDOMINAL PAIN AND GI BLEED. GIVENS SHOWS GASTRITIS/SMALL BOWEL AVMs. STUDY INCOMPLETE.  PLAN:  1. STOP OCTREOTIDE. 2. PROTONIX BID 3. OK TO D/C HOME. NEEDS TO COMPLETE 7 DAY ABX FOR SBP.  4. CBC IN 2 WEEKS AND OPV IN 3-4 WEEKS WITH DR. Laural Golden   Subjective: Since last evaluated the patient PT HAD GIVENS STUDY. TOLERATING POs. NO BRBPR OR MELENA.   Objective: Vital signs in last 24 hours: Vitals:   08/26/17 1910 08/27/17 0548  BP: 107/62 (!) 109/57  Pulse: 74 99  Resp: 16 18  Temp: 97.8 F (36.6 C) 98.4 F (36.9 C)  SpO2: 95% 97%   General appearance: alert, cooperative and no distress Resp: clear to auscultation bilaterally Cardio: regular rate and rhythm GI: soft, non-tender; bowel sounds normal; DISTENDED  Lab Results: K 3.5 Cr 2.70 Hb 8.9 MCV 83 PLT CT 132    Studies/Results: No results found.  Medications: I have reviewed the patient's current medications.   LOS: 5 days

## 2017-08-27 NOTE — Discharge Summary (Addendum)
Physician Discharge Summary  [name] SEG:[id number] DOB: 01/14/1946 DOA: 08/24/2017  PCP: [name], MD  Admit date: 08/24/2017 Discharge date: 08/27/2017  Admitted From: Home Disposition:  Home   Recommendations for Outpatient Follow-up:  1. Follow up with PCP in 1-2 weeks 2. 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 Patient denies any fevers, chills, sore throat, coughing, headache, neck pain, chest pain, nausea, vomiting, diarrhea, abdominal pain. He last had paracentesis approximately 2 weeks prior to this admission on 08/10/2017. The patient went to see his primary care provider on 08/24/2017. Because of his dyspnea on exertion, he was advised to go to emergency department for further evaluation. The patient denies any NSAID use, but states that he has been having melanotic stools for the last 2 weeks. He denies any hematochezia or hematemesis or hematuria. He endorses compliance with all his medications, but states that he has had dietary indiscretion with regard to salt intake. He denies any worsening lower extremity edema or orthopnea. However, he has noted increasing abdominal distention since his last paracentesis.  In the emergency department, the patient was noted to have soft systolic blood pressures in the 90s and low 100s. Sodium was 122 with potassium 5.1. LFTs were unremarkable. WBC was 14.7 with hemoglobin 7.0. 2 units PRBCs were ordered.EKG showed AV paced rhythm. Chest x-ray showed pulmonary interstitial markings.    Discharge Diagnoses:  Acute blood loss  anemia/symptomatic anemia/upper GI bleed -Concerned about variceal bleedvs GAVE -ContinueProtonix IV twice daily>>>home with protonix bid -Transfused 2 units PRBC-->Hgb 7.0 up to 8.5 -GI consultappreciated -08/25/17--EGD--grade 1 distal esophageal varices, mild portal hypertensive gastropathy, sessile polyps in the stomach without stigmata of bleeding. -Discontinueoctreotide -Clear liquid diet>>advanced to soft diet which he tolerated -08/26/17--capsule endo-->no active bleed; SB AVMS -transfused 3 units PRBCs total for the admission -home with cipro x 4 more days to complete 7 days of tx  Acute on chronic renal failure--CKD4 -Likely secondary tohypovolemia -baseline creatinine 2.4-2.8 -improved with fluid and PRBC -serum creatinine 2.70 on day of d/c  Elevated troponin/CAD -Likely secondary to demand ischemia in the setting of worsening renal function -No chest pain presently -Trend troponins--trend is flat -continue atorvastatin -he iss/p CABG in 2004 with cath in 2013 showing patent LIMA-LAD, Free Radial-OM, and SVG-D1. He has baseline dyspnea on exertion but denies any recent chest pain.  Chronic diastolic CHF -7/37/1062 echo EF 60-65%, grade 2 DD, no WMA, mild MR -Daily weights--discharge weight 263 -08/10/2017 weight 277 -Continue home dose torsemide and metolazone -restart aldactone at lower dose>>d/c home with PTA dose -Initially Holding potassium supplementation secondary to increasing potassium  -appreciate cardiology consult  Hyponatremia -This has been chronic in the setting of CHF and liver cirrhosis -Acute worsening secondary to volume depletion -Baseline sodium 128-131 -Na= 129 on day of d/c  Chronic atrial fibrillation -Currently in a paced rhythm -Status post AVN ablation with PPM -CHADS-VASc 4, but not AC candidate due to GI bleeding -continuelower dose metoprolol succinate secondary to soft blood pressures  Liver cirrhosiswith ascites -A  combination of [place] versus cardiac related liver cirrhosis -2/1/19ultrasound-guided paracentesis--4.2L removed  Diabetes mellitus type 2 -NovoLog sliding scale for now in light of acute on  chronic renal failure -Holding home dose NPH and monitor CBGs -check A1C-5.0  Morbid Obesity -BMI 42.27   Discharge Instructions  Discharge Instructions    Diet - low sodium heart healthy   Complete by:  As directed    Diet Carb Modified   Complete by:  As directed    Increase activity slowly   Complete by:  As directed      Allergies as of 08/27/2017      Reactions   Codeine Other (See Comments), Palpitations   Hurting in chest   Diltiazem Hcl Itching   Niacin Other (See Comments)   Felt a "severe burning sensation" Hot flashes   Aspirin    History of Bleeding ulcers    Diltiazem Hcl Other (See Comments), Hives   Gets hot      Medication List    STOP taking these medications   omeprazole 20 MG capsule Commonly known as:  PRILOSEC     TAKE these medications   acetaminophen 500 MG tablet Commonly known as:  TYLENOL Take 1,000 mg by mouth at bedtime as needed for mild pain or headache.   allopurinol 100 MG tablet Commonly known as:  ZYLOPRIM Take 1 tablet (100 mg total) by mouth daily.   atorvastatin 20 MG tablet Commonly known as:  LIPITOR Take 20 mg by mouth daily at 6 PM.   ciprofloxacin 250 MG tablet Commonly known as:  CIPRO Take 1 tablet (250 mg total) by mouth 2 (two) times daily.   colchicine 0.6 MG tablet Take 0.6 mg by mouth daily.   [employer] SPECTRAVITE ADULT 50+ PO Take 1 tablet by mouth daily.   ferrous gluconate 324 MG tablet Commonly known as:  FERGON Take 324 mg by mouth 3 (three) times daily with meals.   gabapentin 300 MG capsule Commonly known as:  NEURONTIN Take 300 mg by mouth 3 (three) times daily.   insulin NPH Human 100 UNIT/ML injection Commonly known as:  HUMULIN N,NOVOLIN N Inject 65 Units into the skin 2 (two) times daily before a  meal.   insulin regular 100 units/mL injection Commonly known as:  NOVOLIN R,HUMULIN R Inject 30 Units into the skin 2 (two) times daily before a meal.   metolazone 5 MG tablet Commonly known as:  ZAROXOLYN Take 1 tablet (5 mg total) by mouth every 3 (three) days.   metoprolol succinate 25 MG 24 hr tablet Commonly known as:  TOPROL-XL Take 1 tablet (25 mg total) by mouth 2 (two) times daily. What changed:    medication strength  how much to take   pantoprazole 40 MG tablet Commonly known as:  PROTONIX Take 1 tablet (40 mg total) by mouth 2 (two) times daily before a meal.   potassium chloride SA 20 MEQ tablet Commonly known as:  K-DUR,KLOR-CON Take 2 tablets (40 mEq total) by mouth 2 (two) times daily.   PROAIR HFA 108 (90 Base) MCG/ACT inhaler Generic drug:  albuterol Take 2 puffs by mouth 2 (two) times daily.   sertraline 50 MG tablet Commonly known as:  ZOLOFT Take 1 tablet (50 mg total) by mouth daily.   spironolactone 50 MG tablet Commonly known as:  ALDACTONE Take 1 tablet (50 mg total) by mouth 2 (two) times daily.   tamsulosin 0.4 MG Caps capsule Commonly known as:  FLOMAX TAKE ONE CAPSULE BY MOUTH EVERY DAY AFTER SUPPER   torsemide 20 MG tablet Commonly known as:  DEMADEX Take 6 tablets (120 mg total) by mouth 2 (two) times  daily.   vitamin C 500 MG tablet Commonly known as:  ASCORBIC ACID Take 500 mg by mouth 2 (two) times daily.       Allergies  Allergen Reactions  . Codeine Other (See Comments) and Palpitations    Hurting in chest  . Diltiazem Hcl Itching  . Niacin Other (See Comments)    Felt a "severe burning sensation" Hot flashes  . Aspirin     History of Bleeding ulcers   . Diltiazem Hcl Other (See Comments) and Hives    Gets hot    Consultations:  Cardiology  GI   Procedures/Studies: Dg Chest 2 View  Result Date: 08/24/2017 CLINICAL DATA:  Pt sent over for low hemoglobin 7.3. States dark tarry stool x1 week and  generalized weakness,COPD, CHF EXAM: CHEST  2 VIEW COMPARISON:  06/01/2017 FINDINGS: Patient's left-sided transvenous pacemaker with leads to the right ventricle. Status post median sternotomy. The heart is enlarged. There is mild prominence of interstitial markings, stable in appearance. There are no focal consolidations. There is right costophrenic angle blunting. IMPRESSION: 1. Cardiomegaly. 2. Suspect mild interstitial pulmonary edema. Electronically Signed   By: Nolon Nations M.D.   On: 08/24/2017 14:12   US Paracentesis  Result Date: 08/25/2017 INDICATION: Cirrhosis, ascites EXAM: ULTRASOUND GUIDED THERAPEUTIC PARACENTESIS MEDICATIONS: None. COMPLICATIONS: None immediate. PROCEDURE: Procedure, benefits, and risks of procedure were discussed with patient. Written informed consent for procedure was obtained. Time out protocol followed. Adequate collection of ascites localized by ultrasound in LEFT lower quadrant. Skin prepped and draped in usual sterile fashion. Skin and soft tissues anesthetized with 10 mL of 1% lidocaine. 5 [place] Yueh catheter placed into peritoneal cavity. 4.2 L of yellow ascitic fluid fluid aspirated by vacuum bottle suction. Procedure tolerated well by patient without immediate complication. FINDINGS: As above IMPRESSION: Successful ultrasound-guided paracentesis yielding 4.2 liters of peritoneal fluid. Electronically Signed   By: Lavonia Dana M.D.   On: 08/25/2017 16:10   US Paracentesis  Result Date: 08/11/2017 INDICATION: Recurrent ascites EXAM: ULTRASOUND-GUIDED PARACENTESIS COMPARISON:  Previous paracentesis MEDICATIONS: 10 cc 1% lidocaine COMPLICATIONS: None immediate. TECHNIQUE: Informed written consent was obtained from the patient after a discussion of the risks, benefits and alternatives to treatment. A timeout was performed prior to the initiation of the procedure. Initial ultrasound scanning demonstrates a large amount of ascites within the right lower abdominal  quadrant. The right lower abdomen was prepped and draped in the usual sterile fashion. 1% lidocaine with epinephrine was used for local anesthesia. Under direct ultrasound guidance, a 19 gauge, 7-cm, Yueh catheter was introduced. An ultrasound image was saved for documentation purposed. The paracentesis was performed. The catheter was removed and a dressing was applied. The patient tolerated the procedure well without immediate post procedural complication. FINDINGS: A total of approximately 3.6 liters of light amber fluid was removed. Samples were sent to the laboratory as requested by the clinical team. IMPRESSION: Successful ultrasound-guided paracentesis yielding 3.6 liters of peritoneal fluid. Read by Lavonia Drafts [facility] Electronically Signed   By: Marijo Conception, M.D.   On: 08/11/2017 14:52        Discharge Exam: Vitals:   08/26/17 1910 08/27/17 0548  BP: 107/62 (!) 109/57  Pulse: 74 99  Resp: 16 18  Temp: 97.8 F (36.6 C) 98.4 F (36.9 C)  SpO2: 95% 97%   Vitals:   08/26/17 1639 08/26/17 1700 08/26/17 1910 08/27/17 0548  BP: 123/62 136/67 107/62 (!) 109/57  Pulse: 76 74 74 99  Resp:  18 16 18   Temp: 98.6 F (37 C) 97.6 F (36.4 C) 97.8 F (36.6 C) 98.4 F (36.9 C)  TempSrc: Oral Oral Oral Oral  SpO2: 99% 100% 95% 97%  Weight:      Height:        General: Pt is alert, awake, not in acute distress Cardiovascular: RRR, S1/S2 +, no rubs, no gallops Respiratory: CTA bilaterally, no wheezing, no rhonchi Abdominal: Soft, NT, ND, bowel sounds + Extremities: no edema, no cyanosis   The results of significant diagnostics from this hospitalization (including imaging, microbiology, ancillary and laboratory) are listed below for reference.    Significant Diagnostic Studies: Dg Chest 2 View  Result Date: 08/24/2017 CLINICAL DATA:  Pt sent over for low hemoglobin 7.3. States dark tarry stool x1 week and generalized weakness,COPD, CHF EXAM: CHEST  2 VIEW COMPARISON:  06/01/2017  FINDINGS: Patient's left-sided transvenous pacemaker with leads to the right ventricle. Status post median sternotomy. The heart is enlarged. There is mild prominence of interstitial markings, stable in appearance. There are no focal consolidations. There is right costophrenic angle blunting. IMPRESSION: 1. Cardiomegaly. 2. Suspect mild interstitial pulmonary edema. Electronically Signed   By: Nolon Nations M.D.   On: 08/24/2017 14:12   US Paracentesis  Result Date: 08/25/2017 INDICATION: Cirrhosis, ascites EXAM: ULTRASOUND GUIDED THERAPEUTIC PARACENTESIS MEDICATIONS: None. COMPLICATIONS: None immediate. PROCEDURE: Procedure, benefits, and risks of procedure were discussed with patient. Written informed consent for procedure was obtained. Time out protocol followed. Adequate collection of ascites localized by ultrasound in LEFT lower quadrant. Skin prepped and draped in usual sterile fashion. Skin and soft tissues anesthetized with 10 mL of 1% lidocaine. 5 [place] Yueh catheter placed into peritoneal cavity. 4.2 L of yellow ascitic fluid fluid aspirated by vacuum bottle suction. Procedure tolerated well by patient without immediate complication. FINDINGS: As above IMPRESSION: Successful ultrasound-guided paracentesis yielding 4.2 liters of peritoneal fluid. Electronically Signed   By: Lavonia Dana M.D.   On: 08/25/2017 16:10   US Paracentesis  Result Date: 08/11/2017 INDICATION: Recurrent ascites EXAM: ULTRASOUND-GUIDED PARACENTESIS COMPARISON:  Previous paracentesis MEDICATIONS: 10 cc 1% lidocaine COMPLICATIONS: None immediate. TECHNIQUE: Informed written consent was obtained from the patient after a discussion of the risks, benefits and alternatives to treatment. A timeout was performed prior to the initiation of the procedure. Initial ultrasound scanning demonstrates a large amount of ascites within the right lower abdominal quadrant. The right lower abdomen was prepped and draped in the usual sterile  fashion. 1% lidocaine with epinephrine was used for local anesthesia. Under direct ultrasound guidance, a 19 gauge, 7-cm, Yueh catheter was introduced. An ultrasound image was saved for documentation purposed. The paracentesis was performed. The catheter was removed and a dressing was applied. The patient tolerated the procedure well without immediate post procedural complication. FINDINGS: A total of approximately 3.6 liters of light amber fluid was removed. Samples were sent to the laboratory as requested by the clinical team. IMPRESSION: Successful ultrasound-guided paracentesis yielding 3.6 liters of peritoneal fluid. Read by Lavonia Drafts [facility] Electronically Signed   By: Marijo Conception, M.D.   On: 08/11/2017 14:52     Microbiology: Recent Results (from the past 240 hour(s))  MRSA PCR Screening     Status: None   Collection Time: 08/24/17  5:24 PM  Result Value Ref Range Status   MRSA by PCR NEGATIVE NEGATIVE Final    Comment:        The GeneXpert MRSA Assay (FDA approved for NASAL specimens only), is  one component of a comprehensive MRSA colonization surveillance program. It is not intended to diagnose MRSA infection nor to guide or monitor treatment for MRSA infections.   Culture, Urine     Status: None   Collection Time: 08/25/17  8:42 AM  Result Value Ref Range Status   Specimen Description   Final    URINE, CLEAN CATCH Performed at [facility], [address], [place], [place] [address]    Special Requests   Final    NONE Performed at [facility], [address], [place], [place] [address]    Culture   Final    NO GROWTH Performed at [facility], [place] [address] [place], [place] [address]    Report Status 08/26/2017 FINAL  Final  Stat Gram stain     Status: None   Collection Time: 08/25/17  4:34 PM  Result Value Ref Range Status   Specimen Description ASCITIC  Final   Special Requests NONE  Final   Gram Stain   Final    CYTOSPIN SMEAR NO ORGANISMS SEEN WBC  PRESENT,BOTH PMN AND MONONUCLEAR Performed at [facility], [address] [place], [place] [address]    Report Status 08/25/2017 FINAL  Final  Culture, body fluid-bottle     Status: None (Preliminary result)   Collection Time: 08/25/17  4:34 PM  Result Value Ref Range Status   Specimen Description ASCITIC  Final   Special Requests BOTTLES DRAWN AEROBIC AND ANAEROBIC 10 CC EACH  Final   Culture   Final    NO GROWTH 2 DAYS Performed at [facility], [address], [place], [place] [address]    Report Status PENDING  Incomplete     Labs: Basic Metabolic Panel: Recent Labs  Lab 08/24/17 1404 08/25/17 0938 08/26/17 0441 08/27/17 0653  NA 122* 123* 125* 129*  K 5.1 4.4 3.8 3.5  CL 89* 89* 89* 90*  CO2 17* 19* 20* 22  GLUCOSE 225* 170* 151* 210*  BUN 216* 110* 105* 101*  CREATININE 3.52* 3.17* 2.86* 2.70*  CALCIUM 8.8* 8.8* 8.8* 9.2  MG  --   --  2.3 2.1   Liver Function Tests: Recent Labs  Lab 08/24/17 1404  AST 23  ALT 13*  ALKPHOS 62  BILITOT 0.7  PROT 6.7  ALBUMIN 3.2*   No results for input(s): LIPASE, AMYLASE in the last 168 hours. No results for input(s): AMMONIA in the last 168 hours. CBC: Recent Labs  Lab 08/24/17 1404 08/25/17 0938 08/26/17 0441 08/27/17 0653  WBC 14.7* 11.3* 9.1 9.7  NEUTROABS 12.4  --   --   --   HGB 7.0* 8.5* 7.6* 8.9*  HCT 24.2* 28.0* 25.9* 28.8*  MCV 85.8 84.3 85.2 83.0  PLT 192 172 140* 132*   Cardiac Enzymes: Recent Labs  Lab 08/24/17 1404 08/24/17 2004 08/25/17 0938 08/25/17 1603  TROPONINI 0.16* 0.13* 0.14* 0.13*   BNP: Invalid input(s): POCBNP CBG: Recent Labs  Lab 08/26/17 1120 08/26/17 1617 08/26/17 2054 08/27/17 0720 08/27/17 1116  GLUCAP 150* 136* 229* 190* 302*    Time coordinating discharge:  Greater than 30 minutes  Signed:  Orson Eva, DO [facility] Pager: [phone] 08/27/2017, 1:42 PM

## 2017-08-28 LAB — TYPE AND SCREEN
ABO/RH(D): A POS
ANTIBODY SCREEN: POSITIVE
DONOR AG TYPE: NEGATIVE
DONOR AG TYPE: NEGATIVE
DONOR AG TYPE: NEGATIVE
Donor AG Type: NEGATIVE
Unit division: 0
Unit division: 0
Unit division: 0
Unit division: 0

## 2017-08-28 LAB — BPAM RBC
BLOOD PRODUCT EXPIRATION DATE: 201902192359
Blood Product Expiration Date: 201902182359
Blood Product Expiration Date: 201902182359
Blood Product Expiration Date: 201902192359
ISSUE DATE / TIME: 201901312359
ISSUE DATE / TIME: 201901312359
ISSUE DATE / TIME: 201902010406
ISSUE DATE / TIME: 201902021635
UNIT TYPE AND RH: 6200
Unit Type and Rh: 6200
Unit Type and Rh: 6200
Unit Type and Rh: 6200

## 2017-08-28 LAB — GLUCOSE, CAPILLARY: GLUCOSE-CAPILLARY: 159 mg/dL — AB (ref 65–99)

## 2017-08-28 NOTE — Procedures (Signed)
  GASTRIC PASSAGE TIME: 3h 2 m, SB PASSAGE TIME:UNABLE TO CALCULATE CAPSULE DID NOT MAKE IT TO CECUM  RESULTS: LIMITED views of gastric mucosa due to retained contents. EDEMA AND ERYTHEMA OF THE PRE-PYLORIC REGION.  SMALL BOWEL: No ULCERS, OR masses seen. AVMs SEEN AT 03:14, 03:41, & 05:49  No old blood or fresh blood in the stomach, OR small bowel.  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  Plan: 1. CONSIDER HEMATOLOGY REFERRAL FOR IV FE 2. CONSIDER ENTEROSCOPY FOR APC 3. CBC IN 2 WEEKS 4. OPV IN 3-4 WEEKS WITH DR. Laural Golden.

## 2017-08-29 ENCOUNTER — Encounter (HOSPITAL_COMMUNITY): Payer: Self-pay | Admitting: Internal Medicine

## 2017-08-29 DIAGNOSIS — D631 Anemia in chronic kidney disease: Secondary | ICD-10-CM | POA: Diagnosis not present

## 2017-08-29 DIAGNOSIS — N183 Chronic kidney disease, stage 3 (moderate): Secondary | ICD-10-CM | POA: Diagnosis not present

## 2017-08-30 ENCOUNTER — Encounter (HOSPITAL_COMMUNITY): Payer: Self-pay | Admitting: Gastroenterology

## 2017-08-30 LAB — PATHOLOGIST SMEAR REVIEW

## 2017-08-30 LAB — CULTURE, BODY FLUID W GRAM STAIN -BOTTLE

## 2017-08-30 LAB — CULTURE, BODY FLUID-BOTTLE: CULTURE: NO GROWTH

## 2017-08-31 DIAGNOSIS — I5030 Unspecified diastolic (congestive) heart failure: Secondary | ICD-10-CM | POA: Diagnosis not present

## 2017-08-31 DIAGNOSIS — N183 Chronic kidney disease, stage 3 (moderate): Secondary | ICD-10-CM | POA: Diagnosis not present

## 2017-08-31 DIAGNOSIS — K7460 Unspecified cirrhosis of liver: Secondary | ICD-10-CM | POA: Diagnosis not present

## 2017-09-01 ENCOUNTER — Other Ambulatory Visit (HOSPITAL_COMMUNITY): Payer: Self-pay | Admitting: *Deleted

## 2017-09-01 MED ORDER — SERTRALINE HCL 50 MG PO TABS: 50.0000 mg | ORAL_TABLET | Freq: Every day | ORAL | 3 refills | Status: AC

## 2017-09-07 ENCOUNTER — Encounter (HOSPITAL_COMMUNITY): Payer: Self-pay | Admitting: Emergency Medicine

## 2017-09-07 ENCOUNTER — Other Ambulatory Visit: Payer: Self-pay

## 2017-09-07 ENCOUNTER — Inpatient Hospital Stay (HOSPITAL_COMMUNITY)
Admission: EM | Admit: 2017-09-07 | Discharge: 2017-09-09 | DRG: 812 | Disposition: A | Discharge status: Home / Self Care | Payer: Medicare Other | Attending: Internal Medicine | Admitting: Internal Medicine

## 2017-09-07 DIAGNOSIS — D62 Acute posthemorrhagic anemia: Principal | ICD-10-CM | POA: Diagnosis present

## 2017-09-07 DIAGNOSIS — Z885 Allergy status to narcotic agent status: Secondary | ICD-10-CM

## 2017-09-07 DIAGNOSIS — E1151 Type 2 diabetes mellitus with diabetic peripheral angiopathy without gangrene: Secondary | ICD-10-CM | POA: Diagnosis present

## 2017-09-07 DIAGNOSIS — F329 Major depressive disorder, single episode, unspecified: Secondary | ICD-10-CM | POA: Diagnosis present

## 2017-09-07 DIAGNOSIS — Z955 Presence of coronary angioplasty implant and graft: Secondary | ICD-10-CM

## 2017-09-07 DIAGNOSIS — I130 Hypertensive heart and chronic kidney disease with heart failure and stage 1 through stage 4 chronic kidney disease, or unspecified chronic kidney disease: Secondary | ICD-10-CM | POA: Diagnosis present

## 2017-09-07 DIAGNOSIS — M109 Gout, unspecified: Secondary | ICD-10-CM | POA: Diagnosis present

## 2017-09-07 DIAGNOSIS — Z79899 Other long term (current) drug therapy: Secondary | ICD-10-CM

## 2017-09-07 DIAGNOSIS — D649 Anemia, unspecified: Secondary | ICD-10-CM

## 2017-09-07 DIAGNOSIS — Z794 Long term (current) use of insulin: Secondary | ICD-10-CM

## 2017-09-07 DIAGNOSIS — I70208 Unspecified atherosclerosis of native arteries of extremities, other extremity: Secondary | ICD-10-CM | POA: Diagnosis present

## 2017-09-07 DIAGNOSIS — Q2730 Arteriovenous malformation, site unspecified: Secondary | ICD-10-CM

## 2017-09-07 DIAGNOSIS — I25810 Atherosclerosis of coronary artery bypass graft(s) without angina pectoris: Secondary | ICD-10-CM | POA: Diagnosis present

## 2017-09-07 DIAGNOSIS — Z951 Presence of aortocoronary bypass graft: Secondary | ICD-10-CM

## 2017-09-07 DIAGNOSIS — I5032 Chronic diastolic (congestive) heart failure: Secondary | ICD-10-CM | POA: Diagnosis not present

## 2017-09-07 DIAGNOSIS — I10 Essential (primary) hypertension: Secondary | ICD-10-CM | POA: Diagnosis present

## 2017-09-07 DIAGNOSIS — J449 Chronic obstructive pulmonary disease, unspecified: Secondary | ICD-10-CM | POA: Diagnosis present

## 2017-09-07 DIAGNOSIS — K7581 Nonalcoholic steatohepatitis (NASH): Secondary | ICD-10-CM | POA: Diagnosis present

## 2017-09-07 DIAGNOSIS — K766 Portal hypertension: Secondary | ICD-10-CM | POA: Diagnosis not present

## 2017-09-07 DIAGNOSIS — R188 Other ascites: Secondary | ICD-10-CM | POA: Diagnosis not present

## 2017-09-07 DIAGNOSIS — E1122 Type 2 diabetes mellitus with diabetic chronic kidney disease: Secondary | ICD-10-CM | POA: Diagnosis present

## 2017-09-07 DIAGNOSIS — Z961 Presence of intraocular lens: Secondary | ICD-10-CM | POA: Diagnosis present

## 2017-09-07 DIAGNOSIS — D500 Iron deficiency anemia secondary to blood loss (chronic): Secondary | ICD-10-CM | POA: Diagnosis present

## 2017-09-07 DIAGNOSIS — R531 Weakness: Secondary | ICD-10-CM | POA: Diagnosis not present

## 2017-09-07 DIAGNOSIS — K922 Gastrointestinal hemorrhage, unspecified: Secondary | ICD-10-CM | POA: Diagnosis not present

## 2017-09-07 DIAGNOSIS — Z9841 Cataract extraction status, right eye: Secondary | ICD-10-CM

## 2017-09-07 DIAGNOSIS — K7460 Unspecified cirrhosis of liver: Secondary | ICD-10-CM | POA: Diagnosis present

## 2017-09-07 DIAGNOSIS — K219 Gastro-esophageal reflux disease without esophagitis: Secondary | ICD-10-CM | POA: Diagnosis present

## 2017-09-07 DIAGNOSIS — R0602 Shortness of breath: Secondary | ICD-10-CM | POA: Diagnosis not present

## 2017-09-07 DIAGNOSIS — Z6841 Body Mass Index (BMI) 40.0 and over, adult: Secondary | ICD-10-CM

## 2017-09-07 DIAGNOSIS — Z9842 Cataract extraction status, left eye: Secondary | ICD-10-CM

## 2017-09-07 DIAGNOSIS — Z862 Personal history of diseases of the blood and blood-forming organs and certain disorders involving the immune mechanism: Secondary | ICD-10-CM | POA: Diagnosis not present

## 2017-09-07 DIAGNOSIS — Z952 Presence of prosthetic heart valve: Secondary | ICD-10-CM

## 2017-09-07 DIAGNOSIS — E669 Obesity, unspecified: Secondary | ICD-10-CM | POA: Diagnosis present

## 2017-09-07 DIAGNOSIS — I4821 Permanent atrial fibrillation: Secondary | ICD-10-CM | POA: Diagnosis present

## 2017-09-07 DIAGNOSIS — N184 Chronic kidney disease, stage 4 (severe): Secondary | ICD-10-CM | POA: Diagnosis present

## 2017-09-07 DIAGNOSIS — G4733 Obstructive sleep apnea (adult) (pediatric): Secondary | ICD-10-CM | POA: Diagnosis present

## 2017-09-07 DIAGNOSIS — I482 Chronic atrial fibrillation: Secondary | ICD-10-CM | POA: Diagnosis present

## 2017-09-07 DIAGNOSIS — Z950 Presence of cardiac pacemaker: Secondary | ICD-10-CM

## 2017-09-07 DIAGNOSIS — K317 Polyp of stomach and duodenum: Secondary | ICD-10-CM | POA: Diagnosis present

## 2017-09-07 DIAGNOSIS — E785 Hyperlipidemia, unspecified: Secondary | ICD-10-CM | POA: Diagnosis present

## 2017-09-07 DIAGNOSIS — Z87891 Personal history of nicotine dependence: Secondary | ICD-10-CM

## 2017-09-07 DIAGNOSIS — E119 Type 2 diabetes mellitus without complications: Secondary | ICD-10-CM

## 2017-09-07 DIAGNOSIS — Z886 Allergy status to analgesic agent status: Secondary | ICD-10-CM

## 2017-09-07 DIAGNOSIS — K921 Melena: Secondary | ICD-10-CM | POA: Diagnosis present

## 2017-09-07 DIAGNOSIS — I2510 Atherosclerotic heart disease of native coronary artery without angina pectoris: Secondary | ICD-10-CM | POA: Diagnosis present

## 2017-09-07 DIAGNOSIS — Z888 Allergy status to other drugs, medicaments and biological substances status: Secondary | ICD-10-CM

## 2017-09-07 DIAGNOSIS — N289 Disorder of kidney and ureter, unspecified: Secondary | ICD-10-CM | POA: Diagnosis not present

## 2017-09-07 DIAGNOSIS — Z8249 Family history of ischemic heart disease and other diseases of the circulatory system: Secondary | ICD-10-CM

## 2017-09-07 DIAGNOSIS — E871 Hypo-osmolality and hyponatremia: Secondary | ICD-10-CM | POA: Diagnosis present

## 2017-09-07 HISTORY — DX: Gout, unspecified: M10.9

## 2017-09-07 LAB — CBC WITH DIFFERENTIAL/PLATELET
BASOS ABS: 0.1 10*3/uL (ref 0.0–0.1)
Basophils Relative: 1 %
Eosinophils Absolute: 0.2 10*3/uL (ref 0.0–0.7)
Eosinophils Relative: 2 %
HCT: 24.7 % — ABNORMAL LOW (ref 39.0–52.0)
HEMOGLOBIN: 7.2 g/dL — AB (ref 13.0–17.0)
LYMPHS PCT: 12 %
Lymphs Abs: 1.2 10*3/uL (ref 0.7–4.0)
MCH: 25.7 pg — ABNORMAL LOW (ref 26.0–34.0)
MCHC: 29.1 g/dL — ABNORMAL LOW (ref 30.0–36.0)
MCV: 88.2 fL (ref 78.0–100.0)
Monocytes Absolute: 0.6 10*3/uL (ref 0.1–1.0)
Monocytes Relative: 6 %
NEUTROS ABS: 8.1 10*3/uL — AB (ref 1.7–7.7)
NEUTROS PCT: 79 %
PLATELETS: 184 10*3/uL (ref 150–400)
RBC: 2.8 MIL/uL — AB (ref 4.22–5.81)
RDW: 20.3 % — ABNORMAL HIGH (ref 11.5–15.5)
WBC: 10.2 10*3/uL (ref 4.0–10.5)

## 2017-09-07 LAB — COMPREHENSIVE METABOLIC PANEL
ALBUMIN: 3.3 g/dL — AB (ref 3.5–5.0)
ALK PHOS: 66 U/L (ref 38–126)
ALT: 16 U/L — AB (ref 17–63)
AST: 25 U/L (ref 15–41)
Anion gap: 15 (ref 5–15)
BUN: 95 mg/dL — ABNORMAL HIGH (ref 6–20)
CALCIUM: 8.8 mg/dL — AB (ref 8.9–10.3)
CHLORIDE: 92 mmol/L — AB (ref 101–111)
CO2: 21 mmol/L — AB (ref 22–32)
Creatinine, Ser: 2.83 mg/dL — ABNORMAL HIGH (ref 0.61–1.24)
GFR calc Af Amer: 25 mL/min — ABNORMAL LOW (ref >60)
GFR calc non Af Amer: 21 mL/min — ABNORMAL LOW (ref >60)
GLUCOSE: 323 mg/dL — AB (ref 65–99)
Potassium: 4 mmol/L (ref 3.5–5.1)
SODIUM: 128 mmol/L — AB (ref 135–145)
Total Bilirubin: 1 mg/dL (ref 0.3–1.2)
Total Protein: 7 g/dL (ref 6.5–8.1)

## 2017-09-07 LAB — GLUCOSE, CAPILLARY: Glucose-Capillary: 169 mg/dL — ABNORMAL HIGH (ref 65–99)

## 2017-09-07 MED ORDER — COLCHICINE 0.6 MG PO TABS
0.6000 mg | ORAL_TABLET | Freq: Every day | ORAL | Status: DC
  Administered 2017-09-09: 0.6 mg via ORAL
  Filled 2017-09-07 (×2): qty 1

## 2017-09-07 MED ORDER — ALLOPURINOL 100 MG PO TABS
100.0000 mg | ORAL_TABLET | Freq: Every day | ORAL | Status: DC
  Administered 2017-09-09: 100 mg via ORAL
  Filled 2017-09-07 (×2): qty 1

## 2017-09-07 MED ORDER — TORSEMIDE 20 MG PO TABS
120.0000 mg | ORAL_TABLET | Freq: Two times a day (BID) | ORAL | Status: DC
  Administered 2017-09-08 – 2017-09-09 (×2): 120 mg via ORAL
  Filled 2017-09-07 (×3): qty 6

## 2017-09-07 MED ORDER — METOLAZONE 5 MG PO TABS
5.0000 mg | ORAL_TABLET | ORAL | Status: DC
  Filled 2017-09-07: qty 1

## 2017-09-07 MED ORDER — PANTOPRAZOLE SODIUM 40 MG IV SOLR
40.0000 mg | Freq: Two times a day (BID) | INTRAVENOUS | Status: DC
  Administered 2017-09-07 – 2017-09-09 (×3): 40 mg via INTRAVENOUS
  Filled 2017-09-07 (×4): qty 40

## 2017-09-07 MED ORDER — TAMSULOSIN HCL 0.4 MG PO CAPS
0.4000 mg | ORAL_CAPSULE | Freq: Every day | ORAL | Status: DC
  Administered 2017-09-07 – 2017-09-08 (×2): 0.4 mg via ORAL
  Filled 2017-09-07 (×2): qty 1

## 2017-09-07 MED ORDER — ONDANSETRON HCL 4 MG PO TABS: 4.0000 mg | ORAL_TABLET | Freq: Four times a day (QID) | ORAL | Status: DC | PRN

## 2017-09-07 MED ORDER — INSULIN NPH (HUMAN) (ISOPHANE) 100 UNIT/ML ~~LOC~~ SUSP
20.0000 [IU] | Freq: Once | SUBCUTANEOUS | Status: AC
  Administered 2017-09-07: 20 [IU] via SUBCUTANEOUS
  Filled 2017-09-07 (×2): qty 10

## 2017-09-07 MED ORDER — SERTRALINE HCL 50 MG PO TABS
50.0000 mg | ORAL_TABLET | Freq: Every day | ORAL | Status: DC
  Administered 2017-09-09: 50 mg via ORAL
  Filled 2017-09-07 (×2): qty 1

## 2017-09-07 MED ORDER — ACETAMINOPHEN 500 MG PO TABS: 1000.0000 mg | ORAL_TABLET | Freq: Every evening | ORAL | Status: DC | PRN

## 2017-09-07 MED ORDER — ALBUTEROL SULFATE (2.5 MG/3ML) 0.083% IN NEBU: 3.0000 mL | INHALATION_SOLUTION | RESPIRATORY_TRACT | Status: DC | PRN

## 2017-09-07 MED ORDER — POTASSIUM CHLORIDE CRYS ER 20 MEQ PO TBCR
40.0000 meq | EXTENDED_RELEASE_TABLET | Freq: Two times a day (BID) | ORAL | Status: DC
  Administered 2017-09-07 – 2017-09-09 (×3): 40 meq via ORAL
  Filled 2017-09-07: qty 4
  Filled 2017-09-07 (×3): qty 2

## 2017-09-07 MED ORDER — ATORVASTATIN CALCIUM 20 MG PO TABS
20.0000 mg | ORAL_TABLET | Freq: Every day | ORAL | Status: DC
  Administered 2017-09-07 – 2017-09-08 (×2): 20 mg via ORAL
  Filled 2017-09-07 (×2): qty 1

## 2017-09-07 MED ORDER — GABAPENTIN 300 MG PO CAPS
300.0000 mg | ORAL_CAPSULE | Freq: Three times a day (TID) | ORAL | Status: DC
  Administered 2017-09-07 – 2017-09-09 (×4): 300 mg via ORAL
  Filled 2017-09-07 (×5): qty 1

## 2017-09-07 MED ORDER — INSULIN NPH (HUMAN) (ISOPHANE) 100 UNIT/ML ~~LOC~~ SUSP
65.0000 [IU] | SUBCUTANEOUS | Status: DC
  Filled 2017-09-07: qty 10

## 2017-09-07 MED ORDER — SODIUM CHLORIDE 0.9 % IV SOLN: Freq: Once | INTRAVENOUS | Status: DC

## 2017-09-07 MED ORDER — INSULIN ASPART 100 UNIT/ML ~~LOC~~ SOLN
0.0000 [IU] | Freq: Three times a day (TID) | SUBCUTANEOUS | Status: DC
  Administered 2017-09-08: 4 [IU] via SUBCUTANEOUS
  Administered 2017-09-08: 3 [IU] via SUBCUTANEOUS
  Administered 2017-09-08: 4 [IU] via SUBCUTANEOUS
  Administered 2017-09-09: 11 [IU] via SUBCUTANEOUS
  Administered 2017-09-09: 4 [IU] via SUBCUTANEOUS

## 2017-09-07 MED ORDER — SPIRONOLACTONE 25 MG PO TABS
50.0000 mg | ORAL_TABLET | Freq: Two times a day (BID) | ORAL | Status: DC
  Administered 2017-09-08 – 2017-09-09 (×2): 50 mg via ORAL
  Filled 2017-09-07 (×3): qty 2

## 2017-09-07 MED ORDER — METOPROLOL SUCCINATE ER 25 MG PO TB24
25.0000 mg | ORAL_TABLET | Freq: Two times a day (BID) | ORAL | Status: DC
  Administered 2017-09-07 – 2017-09-09 (×2): 25 mg via ORAL
  Filled 2017-09-07 (×3): qty 1

## 2017-09-07 MED ORDER — ONDANSETRON HCL 4 MG/2ML IJ SOLN: 4.0000 mg | Freq: Four times a day (QID) | INTRAMUSCULAR | Status: DC | PRN

## 2017-09-07 NOTE — ED Provider Notes (Addendum)
[facility] EMERGENCY DEPARTMENT Provider Note   CSN: [id number] Arrival date & time: 09/07/17  1518     History   Chief Complaint Chief Complaint  Patient presents with  . Abnormal Lab    HPI DYWANE PERUSKI is a 72 y.o. male.  Patient has a history of anemia.  He comes in complaining of weakness.  He sees GI doctor who states he believes his bleeding is in his small intestines.  The patient takes aspirin because of atrial fib.   The history is provided by the patient.  Weakness  Primary symptoms include no focal weakness. This is a recurrent problem. The current episode started more than 1 week ago. The problem has not changed since onset.There was no focality noted. There has been no fever. Associated symptoms include shortness of breath. Pertinent negatives include no chest pain and no headaches. There were no medications administered prior to arrival.    Past Medical History:  Diagnosis Date  . Aortic stenosis 03/08/2012   a.  s/p tissue AVR 10/13 with Dr. [name];   b. Echo 10/13: mod LVH, EF 55-60%, tissue AVR not well seen, no leak, gradient not too high (mean 38mHg), MAC, mild MR, mild LAE, PASP 38  . Ascites    status post paracentesis with removal of 3.4 L of ascitic fluid  . Atrial fibrillation ([place]    Permanent; off of Coumadin for now due to GI bleed  . AVM (arteriovenous malformation)    Recurrent GI bleeding requiring multiple transfusions  . CAD (coronary artery disease)    a. s/p CABG 2004;  b. [place]:  LHC 8/13: Free radial to obtuse marginal patent, SVG-diagonal patent, LIMA-LAD patent, EF 65-70%, mean aortic valve gradient 42  . Carotid bruit 06/14/2011   a. pre-AVR dopplers 10/13: no sig ICA stenosis  . Chronic diastolic heart failure ([place]   . Chronic kidney disease   . Cirrhosis ([place]   . COPD (chronic obstructive pulmonary disease) ([place]   . Depression   . DM2 (diabetes mellitus, type 2) (HCC)    at least 10 yrs  . Dyspnea   . GERD  (gastroesophageal reflux disease)   . Gout   . H/O hiatal hernia   . Headache   . Hyperlipidemia   . Hyperlipidemia   . Hypertension    x 15 yrs  . Insomnia   . Iron deficiency anemia    Requiring intravenous iron  . Mediastinal adenopathy 09/22/2011  . OSA (obstructive sleep apnea) 1999    USES CPAP  . Osteoarthritis   . Overweight(278.02)   . Presence of permanent cardiac pacemaker   . Thrombocytopenia ([facility]     Patient Active Problem List   Diagnosis Date Noted  . Symptomatic anemia 09/07/2017  . COPD (chronic obstructive pulmonary disease) ([place] 09/07/2017  . Gout 09/07/2017  . Palliative care encounter   . Goals of care, counseling/discussion   . Encounter for hospice care discussion   . Elevated troponin   . Acute blood loss anemia 08/24/2017  . Upper GI bleed 08/24/2017  . Cirrhosis of liver with ascites ([place] 08/24/2017  . Chronic diastolic CHF (congestive heart failure) ([place] 08/24/2017  . Acute renal failure superimposed on stage 4 chronic kidney disease ([place] 08/24/2017  . Hyponatremia 08/24/2017  . Gastrointestinal hemorrhage   . Anemia due to chronic renal failure treated with erythropoietin, stage 3 (moderate) ([place] 04/25/2017  . Blood in stool 10/20/2016  . Polydipsia 11/11/2015  . OSA (obstructive sleep apnea) 09/23/2014  .  S/P AV nodal ablation 04/18/14 04/19/2014  . Aortic stenosis, severe with S/P AVR  04/19/2014  . Atrioventricular block, complete ([place]) 04/18/2014  . CKD (chronic kidney disease) 11/05/2013  . Unspecified gastritis and gastroduodenitis without mention of hemorrhage 07/31/2013  . Cough 06/28/2012  . Dyspnea 06/27/2012  . Permanent atrial fibrillation ([place]) 03/02/2012  . History of GI bleed 03/02/2012  . GERD (gastroesophageal reflux disease) 03/02/2012  . Type 2 diabetes mellitus ([place]) 03/02/2012  . Cirrhosis ([place])   . Mediastinal adenopathy 09/22/2011  . Osteoarthritis   . Thrombocytopenia ([place])   . Gynecomastia 09/20/2011  .  Ascites 09/20/2011  . Iron deficiency anemia   . Hyperlipidemia   . Essential hypertension   . CHF (congestive heart failure) ([place])   . 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 Carotid bruit 06/14/2011  . ANEMIA 05/17/2010  . Overweight 05/02/2009  . CAD, ARTERY BYPASS GRAFT 05/02/2009  . 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    Past Surgical History:  Procedure Laterality Date  . ABLATION  04-18-14   AVN ablation by Dr Caryl Comes  . AORTIC VALVE REPLACEMENT  04/25/2012   Procedure: AORTIC VALVE REPLACEMENT (AVR);  Surgeon: Melrose Nakayama, MD;  Location: [place];  Service: Open Heart Surgery;  Laterality: N/A;  . AV NODE ABLATION N/A 04/18/2014   Procedure: AV NODE ABLATION;  Surgeon: Deboraha Sprang, MD;  Location: [facility] CATH LAB;  Service: Cardiovascular;  Laterality: N/A;  . CARPAL TUNNEL RELEASE    10/08/2003  . CATARACT EXTRACTION W/ INTRAOCULAR LENS IMPLANT Bilateral 09/28/2015 , 10/19/2015  . COLONOSCOPY WITH PROPOFOL N/A 10/28/2016   Procedure: COLONOSCOPY WITH PROPOFOL;  Surgeon: Rogene Houston, MD;  [facility];  Service: Endoscopy;  Laterality: N/A;  . CORONARY ANGIOPLASTY WITH STENT PLACEMENT  01/19/2005   drug eluting stent to high grade ostial stenosis of radial artery graft to OM  . CORONARY ARTERY BYPASS GRAFT   10/15/2002    [name]. [name], M.D.     . ESOPHAGOGASTRODUODENOSCOPY N/A 07/31/2013   Procedure: ESOPHAGOGASTRODUODENOSCOPY (EGD);  Surgeon: Milus Banister, MD;  Location: [place];  Service: Endoscopy;  Laterality: N/A;  . ESOPHAGOGASTRODUODENOSCOPY Left 01/11/2014   Procedure: ESOPHAGOGASTRODUODENOSCOPY (EGD);  Surgeon: Juanita Craver, MD;  Location[facility] ENDOSCOPY;  Service: Endoscopy;  Laterality: Left;  . ESOPHAGOGASTRODUODENOSCOPY (EGD) WITH PROPOFOL N/A 10/28/2016   Procedure: ESOPHAGOGASTRODUODENOSCOPY (EGD) WITH PROPOFOL;  Surgeon: Rogene Houston, MD;  [facility];  Service: Endoscopy;  Laterality: N/A;  . ESOPHAGOGASTRODUODENOSCOPY  (EGD) WITH PROPOFOL N/A 08/25/2017   Procedure: ESOPHAGOGASTRODUODENOSCOPY (EGD) WITH PROPOFOL;  Surgeon: Rogene Houston, MD;  [facility];  Service: Endoscopy;  Laterality: N/A;  . GIVENS CAPSULE STUDY N/A 08/26/2017   Procedure: GIVENS CAPSULE STUDY;  Surgeon: Danie Binder, MD;  [facility];  Service: Endoscopy;  Laterality: N/A;  . HERNIA REPAIR    . KNEE ARTHROSCOPY Right 01/20/2016   Procedure: ARTHROSCOPY RIGHT KNEE WITH MENSICAL DEBRIDEMENT;  Surgeon: Gaynelle Arabian, MD;  Location: [facility];  Service: Orthopedics;  Laterality: Right;  . LEFT AND RIGHT HEART CATHETERIZATION WITH CORONARY/GRAFT ANGIOGRAM N/A 03/07/2012   Procedure: LEFT AND RIGHT HEART CATHETERIZATION WITH Beatrix Fetters;  Surgeon: Sherren Mocha, MD;  Location: [facility] CATH LAB;  Service: Cardiovascular;  Laterality: N/A;  . lipoma surgery    . OTHER SURGICAL HISTORY  08/26/2011   [facility],  enteroscopy , revealing "three-four AVMs."   . PACEMAKER INSERTION  03-24-14   STJ Assurity single chamber pacemaker implanted  by Dr Caryl Comes  . PARACENTESIS  12/2015  . PERMANENT PACEMAKER INSERTION N/A 03/24/2014   Procedure: PERMANENT PACEMAKER INSERTION;  Surgeon: Deboraha Sprang, MD;  Location: [facility] CATH LAB;  Service: Cardiovascular;  Laterality: N/A;  . POLYPECTOMY  10/28/2016   Procedure: POLYPECTOMY;  Surgeon: Rogene Houston, MD;  [facility];  Service: Endoscopy;;  gastric  . RIGHT HEART CATHETERIZATION N/A 01/01/2014   Procedure: RIGHT HEART CATH;  Surgeon: Jolaine Artist, MD;  Location: [facility] C[facility] LAB;  Service: Cardiovascular;  Laterality: N/A;  . TEE WITHOUT CARDIOVERSION  03/07/2012   Procedure: TRANSESOPHAGEAL ECHOCARDIOGRAM (TEE);  Surgeon: Thayer Headings, MD;  Location: [place];  Service: Cardiovascular;  Laterality: N/A;       Home Medications    Prior to Admission medications   Medication Sig Start Date End Date Taking? Authorizing Provider  acetaminophen (TYLENOL) 500 MG  tablet Take 1,000 mg by mouth at bedtime as needed for mild pain or headache.    Yes [provider]  allopurinol (ZYLOPRIM) 100 MG tablet Take 1 tablet (100 mg total) by mouth daily. 04/18/17  Yes Larey Dresser, MD  atorvastatin (LIPITOR) 20 MG tablet Take 20 mg by mouth daily at 6 PM.    Yes [provider]  colchicine 0.6 MG tablet Take 0.6 mg by mouth daily.    Yes [provider]  ferrous gluconate (FERGON) 324 MG tablet Take 324 mg by mouth 3 (three) times daily with meals.    Yes [provider]  gabapentin (NEURONTIN) 300 MG capsule Take 300 mg by mouth 3 (three) times daily. 12/31/14  Yes [provider]  insulin NPH Human (HUMULIN N,NOVOLIN N) 100 UNIT/ML injection Inject 65 Units into the skin 2 (two) times daily before a meal.    Yes [provider]  insulin regular (NOVOLIN R,HUMULIN R) 100 units/mL injection Inject 30 Units into the skin 2 (two) times daily before a meal.    Yes [provider]  metolazone (ZAROXOLYN) 5 MG tablet Take 1 tablet (5 mg total) by mouth every 3 (three) days. 07/13/17  Yes Larey Dresser, MD  metoprolol succinate (TOPROL-XL) 25 MG 24 hr tablet Take 1 tablet (25 mg total) by mouth 2 (two) times daily. 08/27/17  Yes Tat, Shanon Brow, MD  Multiple Vitamins-Minerals ([employer] SPECTRAVITE ADULT 50+ PO) Take 1 tablet by mouth daily.   Yes [provider]  pantoprazole (PROTONIX) 40 MG tablet Take 1 tablet (40 mg total) by mouth 2 (two) times daily before a meal. 08/27/17  Yes Tat, Shanon Brow, MD  potassium chloride SA (K-DUR,KLOR-CON) 20 MEQ tablet Take 2 tablets (40 mEq total) by mouth 2 (two) times daily. 07/13/17  Yes Larey Dresser, MD  PROAIR HFA 108 [phone] Base) MCG/ACT inhaler Take 2 puffs by mouth 2 (two) times daily. 03/28/17  Yes [provider]  sertraline (ZOLOFT) 50 MG tablet Take 1 tablet (50 mg total) by mouth daily. 09/01/17  Yes Larey Dresser, MD  spironolactone (ALDACTONE) 50 MG tablet  Take 1 tablet (50 mg total) by mouth 2 (two) times daily. 04/25/17  Yes Larey Dresser, MD  tamsulosin (FLOMAX) 0.4 MG CAPS capsule TAKE ONE CAPSULE BY MOUTH EVERY DAY AFTER SUPPER 07/11/17  Yes Larey Dresser, MD  torsemide (DEMADEX) 20 MG tablet Take 6 tablets (120 mg total) by mouth 2 (two) times daily. 06/29/17  Yes Larey Dresser, MD  vitamin C (ASCORBIC ACID) 500 MG tablet Take 500 mg by  mouth 2 (two) times daily.   Yes [provider]  ciprofloxacin (CIPRO) 250 MG tablet Take 1 tablet (250 mg total) by mouth 2 (two) times daily. Patient not taking: Reported on 09/07/2017 08/27/17   Orson Eva, MD    Family History Family History  Problem Relation Age of Onset  . Hypertension Father   . Diabetes Father   . Heart disease Father   . COPD Sister   . Cancer Maternal Aunt        Breast cancer   . Cancer Maternal Aunt        Breast cancer   . Diabetes Son   . Cancer Daughter        Cervical cancer  . Coronary artery disease Unknown        FAMILY HISTORY    Social History Social History   Tobacco Use  . Smoking status: Former Smoker    Packs/day: 1.00    Years: 30.00    Pack years: 30.00    Types: Cigarettes    Last attempt to quit: 07/25/2000    Years since quitting: 17.1  . Smokeless tobacco: Never Used  . Tobacco comment: Quit smoking in 2003  Substance Use Topics  . Alcohol use: No    Alcohol/week: 0.6 oz    Types: 1 Shots of liquor per week    Comment: stopped drinking alcohol  . Drug use: No     Allergies   Codeine; Diltiazem hcl; Niacin; Aspirin; and Diltiazem hcl   Review of Systems Review of Systems  Constitutional: Negative for appetite change and fatigue.  HENT: Negative for congestion, ear discharge and sinus pressure.   Eyes: Negative for discharge.  Respiratory: Positive for shortness of breath. Negative for cough.   Cardiovascular: Negative for chest pain.  Gastrointestinal: Negative for abdominal pain and diarrhea.       Patient has  had black stools for about a month  Genitourinary: Negative for frequency and hematuria.  Musculoskeletal: Negative for back pain.  Skin: Negative for rash.  Neurological: Positive for weakness. Negative for focal weakness, seizures and headaches.  Psychiatric/Behavioral: Negative for hallucinations.     Physical Exam Updated Vital Signs BP 117/60   Pulse 78   Temp 97.6 F (36.4 C) (Oral)   Resp 18   Ht _0  (1.727 m)   Wt 120.2 kg (265 lb)   SpO2 97%   BMI 40.29 kg/m   Physical Exam  Constitutional: He is oriented to person, place, and time. He appears well-developed.  HENT:  Head: Normocephalic.  Eyes: Conjunctivae and EOM are normal. No scleral icterus.  Neck: Neck supple. No thyromegaly present.  Cardiovascular: Normal rate and regular rhythm. Exam reveals no gallop and no friction rub.  No murmur heard. Pulmonary/Chest: No stridor. He has no wheezes. He has no rales. He exhibits no tenderness.  Abdominal: He exhibits distension. There is no tenderness. There is no rebound.  Musculoskeletal: Normal range of motion. He exhibits no edema.  Lymphadenopathy:    He has no cervical adenopathy.  Neurological: He is oriented to person, place, and time. He exhibits normal muscle tone. Coordination normal.  Skin: No rash noted. No erythema.  Psychiatric: He has a normal mood and affect. His behavior is normal.     ED Treatments / Results  Labs (all labs ordered are listed, but only abnormal results are displayed) Labs Reviewed  CBC WITH DIFFERENTIAL/PLATELET - Abnormal; Notable for the following components:      Result Value  RBC 2.80 (*)    Hemoglobin 7.2 (*)    HCT 24.7 (*)    MCH 25.7 (*)    MCHC 29.1 (*)    RDW 20.3 (*)    Neutro Abs 8.1 (*)    All other components within normal limits  COMPREHENSIVE METABOLIC PANEL - Abnormal; Notable for the following components:   Sodium 128 (*)    Chloride 92 (*)    CO2 21 (*)    Glucose, Bld 323 (*)    BUN 95 (*)     Creatinine, Ser 2.83 (*)    Calcium 8.8 (*)    Albumin 3.3 (*)    ALT 16 (*)    GFR calc non Af Amer 21 (*)    GFR calc Af Amer 25 (*)    All other components within normal limits  HAPTOGLOBIN  TYPE AND SCREEN  PREPARE RBC (CROSSMATCH)    EKG  EKG Interpretation None     CRITICAL CARE Performed by: Milton Ferguson Total critical care time: 35 minutes Critical care time was exclusive of separately billable procedures and treating other patients. Critical care was necessary to treat or prevent imminent or life-threatening deterioration. Critical care was time spent personally by me on the following activities: development of treatment plan with patient and/or surrogate as well as nursing, discussions with consultants, evaluation of patient's response to treatment, examination of patient, obtaining history from patient or surrogate, ordering and performing treatments and interventions, ordering and review of laboratory studies, ordering and review of radiographic studies, pulse oximetry and re-evaluation of patient's condition.   Radiology No results found.  Procedures Procedures (including critical care time)  Medications Ordered in ED Medications  0.9 %  sodium chloride infusion (not administered)  pantoprazole (PROTONIX) injection 40 mg (not administered)     Initial Impression / Assessment and Plan / ED Course  I have reviewed the triage vital signs and the nursing notes.  Pertinent labs & imaging results that were available during my care of the patient were reviewed by me and considered in my medical decision making (see chart for details).     Patient with a GI bleed.  And symptomatic anemia.  His gastroenterologist wants the patient to be transfused a couple units of blood and he will see the patient tomorrow.  Medicine will admit.  Final Clinical Impressions(s) / ED Diagnoses   Final diagnoses:  Symptomatic anemia    ED Discharge Orders    None         Milton Ferguson, MD 09/07/17 Lona Kettle    Milton Ferguson, MD 09/18/17 1945

## 2017-09-07 NOTE — H&P (Addendum)
History and Physical    [name] RFF:[id number] DOB: 10/29/1945 DOA: 09/07/2017  PCP: [name], MD   Patient coming from: Home.  I have personally briefly reviewed patient's old medical records in [place]  Chief Complaint: Abnormal lab result.  HPI: [name] is a 72 y.o. 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  He was recently admitted from 02/03 to 09/01/2016 due to acute blood loss anemia due to upper GI bleed.  EGD showed grade 1 distal esophageal varices, mild portal hypertensive gastropathy, sessile polyps in the stomach without stigmata of bleeding.  Endoscopic capsule study showed no active bleed with small bowel AVMs.  He was transfused a total of 3 PRBC units during the admission.  He denies fever, chills, headache, sore throat, productive cough, chest pain, palpitations, diaphoresis, pitting edema of the lower extremities, abdominal pain, nausea, emesis, diarrhea, dysuria, oliguria, frequency or hematuria.  ED Course: Initial vital signs temperature 36.4C (97.6 F, pulse 75, respirations 18, blood pressure 108/51 mmHg and O2 sat 100% on room air.  His workup shows WBC via 10.2 with 79% neutrophils, hemoglobin 7.2 g/dL (decreased from 8.5 g/dL) and platelets of 184.   Sodium 128, potassium 4.0, chloride 92 and CO2 21 millimol/L.  Calcium 8.8, glucose 323, BUN 95 and creatinine 2.83 mg/dL.  His total protein was 7.0 and albumin 3.3 g/dL.  Liver enzymes and bilirubin were unremarkable.  Dr. Laural Golden was contacted by Dr. Roderic Palau.  He would like the patient received 2 units of PRBC overnight and he will be evaluating in the morning.  Review of Systems: As per HPI otherwise 10 point review of systems negative.    Past Medical History:  Diagnosis Date  . Aortic stenosis 03/08/2012   a.  s/p tissue AVR 10/13 with Dr. [name];   b. Echo 10/13: mod LVH, EF 55-60%, tissue AVR not well seen, no leak, gradient not too high (mean 86mHg), MAC, mild MR, mild LAE, PASP 38  . Ascites    status post paracentesis with removal of 3.4 L of ascitic fluid  . Atrial fibrillation ([place]    Permanent; off of Coumadin for now due to GI bleed  . AVM (arteriovenous malformation)    Recurrent GI bleeding requiring multiple transfusions  . CAD (coronary artery disease)    a. s/p CABG 2004;  [place]. [place]:  LHC 8/13: Free radial to obtuse marginal patent, SVG-diagonal patent, LIMA-LAD patent, EF 65-70%, mean aortic valve gradient 42  . Carotid bruit 06/14/2011   a. pre-AVR dopplers 10/13: no sig ICA stenosis  . Chronic diastolic heart failure ([place]   . Chronic kidney disease   . Cirrhosis ([facility]   . COPD (chronic obstructive pulmonary disease) ([place]   . Depression   . DM2 (diabetes mellitus, type 2) (HCC)    at least 10 yrs  . Dyspnea   . GERD (gastroesophageal reflux disease)   .  Gout   . H/O hiatal hernia   . Headache   . Hyperlipidemia   . Hyperlipidemia   . Hypertension    x 15 yrs  . Insomnia   . Iron deficiency anemia    Requiring intravenous iron  . Mediastinal adenopathy 09/22/2011  . OSA (obstructive sleep apnea) 1999    USES CPAP  . Osteoarthritis   . Overweight(278.02)   . Presence of permanent cardiac pacemaker   . Thrombocytopenia ([place])     Past  Surgical History:  Procedure Laterality Date  . ABLATION  04-18-14   AVN ablation by Dr Caryl Comes  . AORTIC VALVE REPLACEMENT  04/25/2012   Procedure: AORTIC VALVE REPLACEMENT (AVR);  Surgeon: Melrose Nakayama, MD;  Location: [facility];  Service: Open Heart Surgery;  Laterality: N/A;  . AV NODE ABLATION N/A 04/18/2014   Procedure: AV NODE ABLATION;  Surgeon: Deboraha Sprang, MD;  Location: [facility] CATH LAB;  Service: Cardiovascular;  Laterality: N/A;  . CARPAL TUNNEL RELEASE    10/08/2003  . CATARACT EXTRACTION W/ INTRAOCULAR LENS IMPLANT Bilateral 09/28/2015 , 10/19/2015  . COLONOSCOPY WITH PROPOFOL N/A 10/28/2016   Procedure: COLONOSCOPY WITH PROPOFOL;  Surgeon: Rogene Houston, MD;  [facility];  Service: Endoscopy;  Laterality: N/A;  . CORONARY ANGIOPLASTY WITH STENT PLACEMENT  01/19/2005   drug eluting stent to high grade ostial stenosis of radial artery graft to OM  . CORONARY ARTERY BYPASS GRAFT   10/15/2002    [name]. [name], M.D.     . ESOPHAGOGASTRODUODENOSCOPY N/A 07/31/2013   Procedure: ESOPHAGOGASTRODUODENOSCOPY (EGD);  Surgeon: Milus Banister, MD;  Location: [facility];  Service: Endoscopy;  Laterality: N/A;  . ESOPHAGOGASTRODUODENOSCOPY Left 01/11/2014   Procedure: ESOPHAGOGASTRODUODENOSCOPY (EGD);  Surgeon: Juanita Craver, MD;  Location: [facility] ENDOSCOPY;  Service: Endoscopy;  Laterality: Left;  . ESOPHAGOGASTRODUODENOSCOPY (EGD) WITH PROPOFOL N/A 10/28/2016   Procedure: ESOPHAGOGASTRODUODENOSCOPY (EGD) WITH PROPOFOL;  Surgeon: Rogene Houston, MD;  [facility];  Service: Endoscopy;  Laterality: N/A;  . ESOPHAGOGASTRODUODENOSCOPY (EGD) WITH PROPOFOL N/A 08/25/2017   Procedure: ESOPHAGOGASTRODUODENOSCOPY (EGD) WITH PROPOFOL;  Surgeon: Rogene Houston, MD;  [facility];  Service: Endoscopy;  Laterality: N/A;  . GIVENS CAPSULE STUDY N/A 08/26/2017   Procedure: GIVENS CAPSULE STUDY;  Surgeon: Danie Binder, MD;  [facility];  Service: Endoscopy;   Laterality: N/A;  . HERNIA REPAIR    . KNEE ARTHROSCOPY Right 01/20/2016   Procedure: ARTHROSCOPY RIGHT KNEE WITH MENSICAL DEBRIDEMENT;  Surgeon: Gaynelle Arabian, MD;  Location: [facility];  Service: Orthopedics;  Laterality: Right;  . LEFT AND RIGHT HEART CATHETERIZATION WITH CORONARY/GRAFT ANGIOGRAM N/A 03/07/2012   Procedure: LEFT AND RIGHT HEART CATHETERIZATION WITH Beatrix Fetters;  Surgeon: Sherren Mocha, MD;  Location: [facility] CATH LAB;  Service: Cardiovascular;  Laterality: N/A;  . lipoma surgery    . OTHER SURGICAL HISTORY  08/26/2011   [facility],  enteroscopy , revealing "three-four AVMs."   . PACEMAKER INSERTION  03-24-14   STJ Assurity single chamber pacemaker implanted by Dr Caryl Comes  . PARACENTESIS  12/2015  . PERMANENT PACEMAKER INSERTION N/A 03/24/2014   Procedure: PERMANENT PACEMAKER INSERTION;  Surgeon: Deboraha Sprang, MD;  Location: [facility] CATH LAB;  Service: Cardiovascular;  Laterality: N/A;  . POLYPECTOMY  10/28/2016   Procedure: POLYPECTOMY;  Surgeon: Rogene Houston, MD;  [facility];  Service: Endoscopy;;  gastric  . RIGHT HEART CATHETERIZATION N/A 01/01/2014   Procedure: RIGHT HEART CATH;  Surgeon: Shaune Pascal  Bensimhon, MD;  Location: [facility] CATH LAB;  Service: Cardiovascular;  Laterality: N/A;  . TEE WITHOUT CARDIOVERSION  03/07/2012   Procedure: TRANSESOPHAGEAL ECHOCARDIOGRAM (TEE);  Surgeon: Thayer Headings, MD;  Location: [facility] ENDOSCOPY;  Service: Cardiovascular;  Laterality: N/A;     reports that he quit smoking about 17 years ago. His smoking use included cigarettes. He has a 30.00 pack-year smoking history. he has never used smokeless tobacco. He reports that he does not drink alcohol or use drugs.  Allergies  Allergen Reactions  . Codeine Other (See Comments) and Palpitations    Hurting in chest  . Diltiazem Hcl Itching  . Niacin Other (See Comments)    Felt a "severe burning sensation" Hot flashes  . Aspirin     History of Bleeding ulcers   . Diltiazem Hcl Other  (See Comments) and Hives    Gets hot    Family History  Problem Relation Age of Onset  . Hypertension Father   . Diabetes Father   . Heart disease Father   . COPD Sister   . Cancer Maternal Aunt        Breast cancer   . Cancer Maternal Aunt        Breast cancer   . Diabetes Son   . Cancer Daughter        Cervical cancer  . Coronary artery disease Unknown        FAMILY HISTORY    Prior to Admission medications   Medication Sig Start Date End Date Taking? Authorizing Provider  acetaminophen (TYLENOL) 500 MG tablet Take 1,000 mg by mouth at bedtime as needed for mild pain or headache.    Yes [provider]  allopurinol (ZYLOPRIM) 100 MG tablet Take 1 tablet (100 mg total) by mouth daily. 04/18/17  Yes Larey Dresser, MD  atorvastatin (LIPITOR) 20 MG tablet Take 20 mg by mouth daily at 6 PM.    Yes [provider]  colchicine 0.6 MG tablet Take 0.6 mg by mouth daily.    Yes [provider]  ferrous gluconate (FERGON) 324 MG tablet Take 324 mg by mouth 3 (three) times daily with meals.    Yes [provider]  gabapentin (NEURONTIN) 300 MG capsule Take 300 mg by mouth 3 (three) times daily. 12/31/14  Yes [provider]  insulin NPH Human (HUMULIN N,NOVOLIN N) 100 UNIT/ML injection Inject 65 Units into the skin 2 (two) times daily before a meal.    Yes [provider]  insulin regular (NOVOLIN R,HUMULIN R) 100 units/mL injection Inject 30 Units into the skin 2 (two) times daily before a meal.    Yes [provider]  metolazone (ZAROXOLYN) 5 MG tablet Take 1 tablet (5 mg total) by mouth every 3 (three) days. 07/13/17  Yes Larey Dresser, MD  metoprolol succinate (TOPROL-XL) 25 MG 24 hr tablet Take 1 tablet (25 mg total) by mouth 2 (two) times daily. 08/27/17  Yes Tat, Shanon Brow, MD  Multiple Vitamins-Minerals ([employer] SPECTRAVITE ADULT 50+ PO) Take 1 tablet by mouth daily.   Yes [provider]  pantoprazole (PROTONIX) 40 MG  tablet Take 1 tablet (40 mg total) by mouth 2 (two) times daily before a meal. 08/27/17  Yes Tat, Shanon Brow, MD  potassium chloride SA (K-DUR,KLOR-CON) 20 MEQ tablet Take 2 tablets (40 mEq total) by mouth 2 (two) times daily. 07/13/17  Yes Larey Dresser, MD  PROAIR HFA 108 [phone] Base) MCG/ACT inhaler Take 2 puffs by mouth 2 (two)  times daily. 03/28/17  Yes [provider]  sertraline (ZOLOFT) 50 MG tablet Take 1 tablet (50 mg total) by mouth daily. 09/01/17  Yes Larey Dresser, MD  spironolactone (ALDACTONE) 50 MG tablet Take 1 tablet (50 mg total) by mouth 2 (two) times daily. 04/25/17  Yes Larey Dresser, MD  tamsulosin (FLOMAX) 0.4 MG CAPS capsule TAKE ONE CAPSULE BY MOUTH EVERY DAY AFTER SUPPER 07/11/17  Yes Larey Dresser, MD  torsemide (DEMADEX) 20 MG tablet Take 6 tablets (120 mg total) by mouth 2 (two) times daily. 06/29/17  Yes Larey Dresser, MD  vitamin C (ASCORBIC ACID) 500 MG tablet Take 500 mg by mouth 2 (two) times daily.   Yes [provider]  ciprofloxacin (CIPRO) 250 MG tablet Take 1 tablet (250 mg total) by mouth 2 (two) times daily. Patient not taking: Reported on 09/07/2017 08/27/17   Orson Eva, MD    Physical Exam: Vitals:   09/07/17 1931 09/07/17 2021 09/07/17 2023 09/07/17 2106  BP: 117/60  117/60 (!) 108/54  Pulse: 78 78 78 74  Resp: 18 18 18 18   Temp:    98.5 F (36.9 C)  TempSrc:    Oral  SpO2: 97% 97% 97% 98%  Weight:    123.6 kg (272 lb 7.8 oz)  Height:    5' 8"  (1.727 m)    Constitutional: NAD, calm, comfortable Eyes: PERRL, lids and conjunctivae normal are pale. ENMT: Mucous membranes are moist. Posterior pharynx clear of any exudate or lesions. Neck: normal, supple, no masses, no thyromegaly Respiratory: Decreased breath sounds on bases, otherwise clear to auscultation bilaterally, no wheezing, no crackles. Normal respiratory effort. No accessory muscle use.  Cardiovascular: Regular rate and rhythm, 2/6 SEM, no rubs / gallops. No extremity  edema. 2+ pedal pulses. No carotid bruits.  Abdomen: Obese, ascites, positive caput medusae, soft, no tenderness, no masses palpated. No hepatosplenomegaly. Bowel sounds positive.  Musculoskeletal: no clubbing / cyanosis. Good ROM, no contractures. Normal muscle tone.  Skin: Multiple areas of ecchymosis on extremities.  Several healing abrasions on forearms. Neurologic: CN 2-12 grossly intact. Sensation intact, DTR normal. Strength 5/5 in all 4.  Psychiatric: Normal judgment and insight. Alert and oriented x 4. Normal mood.    Labs on Admission: I have personally reviewed following labs and imaging studies  CBC: Recent Labs  Lab 09/07/17 1612  WBC 10.2  NEUTROABS 8.1*  HGB 7.2*  HCT 24.7*  MCV 88.2  PLT 379   Basic Metabolic Panel: Recent Labs  Lab 09/07/17 1612  NA 128*  K 4.0  CL 92*  CO2 21*  GLUCOSE 323*  BUN 95*  CREATININE 2.83*  CALCIUM 8.8*   GFR: Estimated Creatinine Clearance: 31.5 mL/min (A) (by C-G formula based on SCr of 2.83 mg/dL (H)). Liver Function Tests: Recent Labs  Lab 09/07/17 1612  AST 25  ALT 16*  ALKPHOS 66  BILITOT 1.0  PROT 7.0  ALBUMIN 3.3*   No results for input(s): LIPASE, AMYLASE in the last 168 hours. No results for input(s): AMMONIA in the last 168 hours. Coagulation Profile: No results for input(s): INR, PROTIME in the last 168 hours. Cardiac Enzymes: No results for input(s): CKTOTAL, CKMB, CKMBINDEX, TROPONINI in the last 168 hours. BNP (last 3 results) No results for input(s): PROBNP in the last 8760 hours. HbA1C: No results for input(s): HGBA1C in the last 72 hours. CBG: No results for input(s): GLUCAP in the last 168 hours. Lipid Profile: No results for input(s): CHOL, HDL,  LDLCALC, TRIG, CHOLHDL, LDLDIRECT in the last 72 hours. Thyroid Function Tests: No results for input(s): TSH, T4TOTAL, FREET4, T3FREE, THYROIDAB in the last 72 hours. Anemia Panel: No results for input(s): VITAMINB12, FOLATE, FERRITIN, TIBC, IRON,  RETICCTPCT in the last 72 hours. Urine analysis:    Component Value Date/Time   COLORURINE YELLOW 08/25/2017 0842   APPEARANCEUR CLEAR 08/25/2017 0842   LABSPEC 1.009 08/25/2017 0842   PHURINE 5.0 08/25/2017 0842   GLUCOSEU NEGATIVE 08/25/2017 0842   HGBUR NEGATIVE 08/25/2017 0842   BILIRUBINUR NEGATIVE 08/25/2017 0842   KETONESUR NEGATIVE 08/25/2017 0842   PROTEINUR NEGATIVE 08/25/2017 0842   UROBILINOGEN 0.2 04/17/2012 1430   NITRITE NEGATIVE 08/25/2017 0842   LEUKOCYTESUR NEGATIVE 08/25/2017 0842   08/19/2016 echocardiogram complete ------------------------------------------------------------------- LV EF: 60% -   65%  ------------------------------------------------------------------- Indications:      CHF - 428.0.  ------------------------------------------------------------------- History:   PMH:   Atrial fibrillation.  Congestive heart failure. Aortic valve disease.  Chronic obstructive pulmonary disease.  Risk factors:  Former tobacco use. Hypertension. Obese.  ------------------------------------------------------------------- Study Conclusions  - Left ventricle: The cavity size was normal. Wall thickness was   increased in a pattern of moderate LVH. Systolic function was   normal. The estimated ejection fraction was in the range of 60%   to 65%. Wall motion was normal; there were no regional wall   motion abnormalities. Features are consistent with a pseudonormal   left ventricular filling pattern, with concomitant abnormal   relaxation and increased filling pressure (grade 2 diastolic   dysfunction). - Aortic valve: A bioprosthesis was present. Valve area (VTI): 0.71   cm^2. Valve area (Vmax): 0.7 cm^2. Valve area (Vmean): 0.64 cm^2. - Mitral valve: There was mild regurgitation. Valve area by   pressure half-time: 1.83 cm^2. Valve area by continuity equation   (using LVOT flow): 0.83 cm^2. - Left atrium: The atrium was mildly to moderately dilated. - Right  atrium: The atrium was moderately dilated. - Tricuspid valve: There was moderate regurgitation. - Pulmonary arteries: Systolic pressure was moderately increased.   PA peak pressure: 59 mm Hg (S).  Radiological Exams on Admission: No results found.  EKG: Independently reviewed.    Assessment/Plan Principal Problem:   Symptomatic anemia Observation/telemetry. Transfuse 2 units of PRBC tonight. Follow-up hematocrit and hemoglobin in a.m. Keep n.p.o. Protonix 40 mg IVP twice daily. GI to evaluate in a.m.  Active Problems:   CAD, ARTERY BYPASS GRAFT On atorvastatin and metoprolol. Not on antiplatelet therapy due to coagulopathy.    Hyperlipidemia On atorvastatin 20 mg p.o. daily. Follow hepatic function panel closely. Fasting lipid profile to be followed as an outpatient.    Essential hypertension Continue metoprolol and diuretics. Monitor blood pressure, heart rate, renal function and electrolytes.    Permanent atrial fibrillation (HCC) CHA?DS?-VASc Score of at least 5. Status post AVN ablation with PPM. Not on anticoagulation or antiplatelet therapy. Continue metoprolol succinate 25 mg p.o. twice daily for rate control.    Type 2 diabetes mellitus (HCC) Currently n.p.o. status,  although the patient had dinner brought by his family. NPH insulin decreased to 20 units at bedtime tonight. CBG monitoring with regular insulin sliding scale once cleared for diet by GI.    CKD (chronic kidney disease) Monitor renal function and electrolytes.    OSA (obstructive sleep apnea) Not on CPAP at home.   Declined using CPAP in the hospital.    Cirrhosis of liver with ascites ([place]) Denies abdominal tension at this time. Paracentesis as needed. Check  PTH/INR in the morning.    Hyponatremia Secondary to volume overload due to ascites and diuretic use. Continue diuretics and monitor sodium level.    COPD (chronic obstructive pulmonary disease) ([place]) Supplemental oxygen as  needed. Bronchodilators as needed.    Gout Continue allopurinol and colchicine.    Depression Continue sertraline 50 mg p.o. daily.     DVT prophylaxis: SCDs. Code Status: Full code. Family Communication: His wife and daughter were present in the room. Disposition Plan: Observation for PRBC transfusion and GI evaluation in a.m. Consults called: Routine gastroenterology consult (Dr. Laural Golden). Admission status: Observation/telemetry.   Reubin Milan MD [facility] Pager [phone].  If 7PM-7AM, please contact night-coverage [email or web address] Password [facility]  09/07/2017, 9:41 PM

## 2017-09-07 NOTE — ED Triage Notes (Signed)
PT sent over for low hemoglobin of 7.0 at his PCP office today (Dr. Olen Pel). PT states he has hx chronic low hemoglobin.

## 2017-09-08 ENCOUNTER — Inpatient Hospital Stay (HOSPITAL_COMMUNITY): Payer: Medicare Other

## 2017-09-08 DIAGNOSIS — K7581 Nonalcoholic steatohepatitis (NASH): Secondary | ICD-10-CM | POA: Diagnosis present

## 2017-09-08 DIAGNOSIS — K766 Portal hypertension: Secondary | ICD-10-CM | POA: Diagnosis present

## 2017-09-08 DIAGNOSIS — R188 Other ascites: Secondary | ICD-10-CM | POA: Diagnosis not present

## 2017-09-08 DIAGNOSIS — G4733 Obstructive sleep apnea (adult) (pediatric): Secondary | ICD-10-CM | POA: Diagnosis present

## 2017-09-08 DIAGNOSIS — D62 Acute posthemorrhagic anemia: Secondary | ICD-10-CM | POA: Diagnosis present

## 2017-09-08 DIAGNOSIS — Z952 Presence of prosthetic heart valve: Secondary | ICD-10-CM | POA: Diagnosis not present

## 2017-09-08 DIAGNOSIS — D649 Anemia, unspecified: Secondary | ICD-10-CM | POA: Diagnosis not present

## 2017-09-08 DIAGNOSIS — F329 Major depressive disorder, single episode, unspecified: Secondary | ICD-10-CM | POA: Diagnosis present

## 2017-09-08 DIAGNOSIS — I70208 Unspecified atherosclerosis of native arteries of extremities, other extremity: Secondary | ICD-10-CM | POA: Diagnosis present

## 2017-09-08 DIAGNOSIS — K219 Gastro-esophageal reflux disease without esophagitis: Secondary | ICD-10-CM | POA: Diagnosis present

## 2017-09-08 DIAGNOSIS — N184 Chronic kidney disease, stage 4 (severe): Secondary | ICD-10-CM | POA: Diagnosis present

## 2017-09-08 DIAGNOSIS — I130 Hypertensive heart and chronic kidney disease with heart failure and stage 1 through stage 4 chronic kidney disease, or unspecified chronic kidney disease: Secondary | ICD-10-CM | POA: Diagnosis present

## 2017-09-08 DIAGNOSIS — K922 Gastrointestinal hemorrhage, unspecified: Secondary | ICD-10-CM | POA: Diagnosis not present

## 2017-09-08 DIAGNOSIS — Z6841 Body Mass Index (BMI) 40.0 and over, adult: Secondary | ICD-10-CM | POA: Diagnosis not present

## 2017-09-08 DIAGNOSIS — I5032 Chronic diastolic (congestive) heart failure: Secondary | ICD-10-CM | POA: Diagnosis present

## 2017-09-08 DIAGNOSIS — M109 Gout, unspecified: Secondary | ICD-10-CM | POA: Diagnosis present

## 2017-09-08 DIAGNOSIS — E1122 Type 2 diabetes mellitus with diabetic chronic kidney disease: Secondary | ICD-10-CM | POA: Diagnosis present

## 2017-09-08 DIAGNOSIS — K921 Melena: Secondary | ICD-10-CM | POA: Diagnosis present

## 2017-09-08 DIAGNOSIS — K7460 Unspecified cirrhosis of liver: Secondary | ICD-10-CM | POA: Diagnosis not present

## 2017-09-08 DIAGNOSIS — Q2730 Arteriovenous malformation, site unspecified: Secondary | ICD-10-CM | POA: Diagnosis not present

## 2017-09-08 DIAGNOSIS — I482 Chronic atrial fibrillation: Secondary | ICD-10-CM | POA: Diagnosis present

## 2017-09-08 DIAGNOSIS — Z951 Presence of aortocoronary bypass graft: Secondary | ICD-10-CM | POA: Diagnosis not present

## 2017-09-08 DIAGNOSIS — E1151 Type 2 diabetes mellitus with diabetic peripheral angiopathy without gangrene: Secondary | ICD-10-CM | POA: Diagnosis present

## 2017-09-08 DIAGNOSIS — Z950 Presence of cardiac pacemaker: Secondary | ICD-10-CM | POA: Diagnosis not present

## 2017-09-08 DIAGNOSIS — E871 Hypo-osmolality and hyponatremia: Secondary | ICD-10-CM | POA: Diagnosis present

## 2017-09-08 DIAGNOSIS — J449 Chronic obstructive pulmonary disease, unspecified: Secondary | ICD-10-CM | POA: Diagnosis present

## 2017-09-08 LAB — COMPREHENSIVE METABOLIC PANEL
ALT: 15 U/L — AB (ref 17–63)
AST: 22 U/L (ref 15–41)
Albumin: 3.6 g/dL (ref 3.5–5.0)
Alkaline Phosphatase: 74 U/L (ref 38–126)
Anion gap: 13 (ref 5–15)
BILIRUBIN TOTAL: 2.6 mg/dL — AB (ref 0.3–1.2)
BUN: 92 mg/dL — AB (ref 6–20)
CHLORIDE: 97 mmol/L — AB (ref 101–111)
CO2: 24 mmol/L (ref 22–32)
CREATININE: 2.62 mg/dL — AB (ref 0.61–1.24)
Calcium: 9.7 mg/dL (ref 8.9–10.3)
GFR, EST AFRICAN AMERICAN: 27 mL/min — AB (ref >60)
GFR, EST NON AFRICAN AMERICAN: 23 mL/min — AB (ref >60)
Glucose, Bld: 135 mg/dL — ABNORMAL HIGH (ref 65–99)
Potassium: 4.1 mmol/L (ref 3.5–5.1)
Sodium: 134 mmol/L — ABNORMAL LOW (ref 135–145)
TOTAL PROTEIN: 7.4 g/dL (ref 6.5–8.1)

## 2017-09-08 LAB — CBC
HEMATOCRIT: 30 % — AB (ref 39.0–52.0)
Hemoglobin: 8.8 g/dL — ABNORMAL LOW (ref 13.0–17.0)
MCH: 25.8 pg — AB (ref 26.0–34.0)
MCHC: 29.3 g/dL — ABNORMAL LOW (ref 30.0–36.0)
MCV: 88 fL (ref 78.0–100.0)
PLATELETS: 181 10*3/uL (ref 150–400)
RBC: 3.41 MIL/uL — AB (ref 4.22–5.81)
RDW: 19.2 % — AB (ref 11.5–15.5)
WBC: 10.2 10*3/uL (ref 4.0–10.5)

## 2017-09-08 LAB — GLUCOSE, CAPILLARY
GLUCOSE-CAPILLARY: 165 mg/dL — AB (ref 65–99)
GLUCOSE-CAPILLARY: 189 mg/dL — AB (ref 65–99)
Glucose-Capillary: 182 mg/dL — ABNORMAL HIGH (ref 65–99)
Glucose-Capillary: 128 mg/dL — ABNORMAL HIGH (ref 65–99)
Glucose-Capillary: 246 mg/dL — ABNORMAL HIGH (ref 65–99)

## 2017-09-08 LAB — PROTIME-INR
INR: 1.1
PROTHROMBIN TIME: 14.1 s (ref 11.4–15.2)

## 2017-09-08 LAB — HAPTOGLOBIN: HAPTOGLOBIN: 130 mg/dL (ref 34–200)

## 2017-09-08 LAB — PREPARE RBC (CROSSMATCH)

## 2017-09-08 MED ORDER — INSULIN ASPART 100 UNIT/ML IV SOLN: 8.0000 [IU] | Freq: Once | INTRAVENOUS | Status: DC

## 2017-09-08 MED ORDER — HEPARIN SOD (PORK) LOCK FLUSH 100 UNIT/ML IV SOLN
INTRAVENOUS | Status: AC
  Filled 2017-09-08: qty 5

## 2017-09-08 MED ORDER — FENTANYL CITRATE (PF) 100 MCG/2ML IJ SOLN
12.5000 ug | INTRAMUSCULAR | Status: DC | PRN
  Administered 2017-09-08: 12.5 ug via INTRAVENOUS
  Filled 2017-09-08: qty 2

## 2017-09-08 MED ORDER — SODIUM PERTECHNETATE TC 99M INJECTION
25.0000 | Freq: Once | INTRAVENOUS | Status: AC | PRN
  Administered 2017-09-08: 24 via INTRAVENOUS

## 2017-09-08 NOTE — Progress Notes (Signed)
PROGRESS NOTE    KENNIS WISSMANN  SEG:[id number] DOB: Mar 24, 1946 DOA: 09/07/2017 PCP: [name], MD   Brief Narrative:   This is a 72 year old male with history of liver cirrhosis and chronic ascites, GI AVMs with recurrent history of GI bleed, GERD/hiatal hernia, and recent admission from 2/3-2/8 due to acute blood loss anemia with upper GI bleed at which point EGD demonstrated grade 1 distal esophageal varices with mild portal hypertensive gastropathy with 3 units of PRBCs transfused during that admission who presented with a recurrence of anemia noted on lab work with several episodes of melena as well as worsening dyspnea, fatigue, and dizziness.  He is currently undergoing PRBC transfusion and was made n.p.o. on admission, but did receive some breakfast this morning inadvertently.  He is awaiting further evaluation by GI this morning.  Assessment & Plan:   Principal Problem:   Symptomatic anemia Active Problems:   CAD, ARTERY BYPASS GRAFT   Hyperlipidemia   Essential hypertension   Permanent atrial fibrillation (HCC)   Type 2 diabetes mellitus (HCC)   CKD (chronic kidney disease)   OSA (obstructive sleep apnea)   Cirrhosis of liver with ascites (HCC)   Hyponatremia   COPD (chronic obstructive pulmonary disease) (HCC)   Gout   1. Symptomatic acute blood loss anemia.  Continue with transfusion of PRBC with follow-up H&H.  This is suspected to be secondary to recurrence of a GI bleed possibly upper due to symptoms of melena.  Vital signs are currently stable and patient is awaiting GI evaluation.  Keep n.p.o. strictly and maintain on Protonix 40 mg IV twice daily.  Hopefully endoscopy can be performed later today for further evaluation. 2. Liver cirrhosis with ascites.  Abdomen is not significantly tense at this time and paracentesis can be ordered as needed. 3. Hyponatremia likely secondary to a ascites and diuretic use.  Continue to monitor with repeat labs 4. COPD.   Supplemental oxygen and bronchodilators as needed. 5. Type 2 diabetes.  Continue current management as patient is n.p.o. 6. Permanent atrial fibrillation with chads vasc of 5.  Continue metoprolol for rate control and avoid anticoagulation.  He is status post AV node ablation with PPM. 7. Essential hypertension.  Monitor closely due to soft BP.  Continue diuretics for now and metoprolol. 8. Dyslipidemia.  Continue on atorvastatin and monitor hepatic function. 9. CKD.  Monitor renal function and electrolytes. 10. OSA.  Not on CPAP at home and declined use while in hospital.   DVT prophylaxis:SCDs Code Status: Full code Family Communication: Wife and daughter in room Disposition Plan: GI evaluation pending this am   Consultants:   GI Dr. Laural Golden  Procedures:   None  Antimicrobials:   None   Subjective: Patient seen and evaluated today with no new acute complaints or concerns. No acute concerns or events noted overnight. No BM since this admission.  Objective: Vitals:   09/08/17 0459 09/08/17 0820 09/08/17 0922 09/08/17 0939  BP: 105/73 (!) 93/40 (!) 101/55 (!) 98/49  Pulse: 75 75 74 75  Resp: 18 18 18 18   Temp: 98.6 F (37 C) 97.8 F (36.6 C) 98.1 F (36.7 C) (!) 97.5 F (36.4 C)  TempSrc: Oral Oral Oral Oral  SpO2: 97% 99% 98% 99%  Weight:      Height:        Intake/Output Summary (Last 24 hours) at 09/08/2017 1111 Last data filed at 09/08/2017 0924 Gross per 24 hour  Intake 315 ml  Output -  Net 315  ml   Filed Weights   09/07/17 1531 09/07/17 2106  Weight: 120.2 kg (265 lb) 123.6 kg (272 lb 7.8 oz)    Examination:  General exam: Appears calm and comfortable  Respiratory system: Clear to auscultation. Respiratory effort normal. Cardiovascular system: S1 & S2 heard, RRR. No JVD, murmurs, rubs, gallops or clicks. No pedal edema. Gastrointestinal system: Abdomen is moderately distended, but not tense, soft and nontender. No organomegaly or masses felt. Normal  bowel sounds heard. Central nervous system: Alert and oriented. No focal neurological deficits. Extremities: Symmetric 5 x 5 power. Skin: No rashes, lesions or ulcers Psychiatry: Judgement and insight appear normal. Mood & affect appropriate.     Data Reviewed: I have personally reviewed following labs and imaging studies  CBC: Recent Labs  Lab 09/07/17 1612  WBC 10.2  NEUTROABS 8.1*  HGB 7.2*  HCT 24.7*  MCV 88.2  PLT 573   Basic Metabolic Panel: Recent Labs  Lab 09/07/17 1612  NA 128*  K 4.0  CL 92*  CO2 21*  GLUCOSE 323*  BUN 95*  CREATININE 2.83*  CALCIUM 8.8*   GFR: Estimated Creatinine Clearance: 31.5 mL/min (A) (by C-G formula based on SCr of 2.83 mg/dL (H)). Liver Function Tests: Recent Labs  Lab 09/07/17 1612  AST 25  ALT 16*  ALKPHOS 66  BILITOT 1.0  PROT 7.0  ALBUMIN 3.3*   No results for input(s): LIPASE, AMYLASE in the last 168 hours. No results for input(s): AMMONIA in the last 168 hours. Coagulation Profile: No results for input(s): INR, PROTIME in the last 168 hours. Cardiac Enzymes: No results for input(s): CKTOTAL, CKMB, CKMBINDEX, TROPONINI in the last 168 hours. BNP (last 3 results) No results for input(s): PROBNP in the last 8760 hours. HbA1C: No results for input(s): HGBA1C in the last 72 hours. CBG: Recent Labs  Lab 09/07/17 2204 09/08/17 0732  GLUCAP 169* 165*   Lipid Profile: No results for input(s): CHOL, HDL, LDLCALC, TRIG, CHOLHDL, LDLDIRECT in the last 72 hours. Thyroid Function Tests: No results for input(s): TSH, T4TOTAL, FREET4, T3FREE, THYROIDAB in the last 72 hours. Anemia Panel: No results for input(s): VITAMINB12, FOLATE, FERRITIN, TIBC, IRON, RETICCTPCT in the last 72 hours. Sepsis Labs: No results for input(s): PROCALCITON, LATICACIDVEN in the last 168 hours.  No results found for this or any previous visit (from the past 240 hour(s)).       Radiology Studies: No results found.      Scheduled  Meds: . allopurinol  100 mg Oral Daily  . atorvastatin  20 mg Oral q1800  . colchicine  0.6 mg Oral Daily  . gabapentin  300 mg Oral TID  . insulin aspart  0-20 Units Subcutaneous TID WC  . metolazone  5 mg Oral Q72H  . metoprolol succinate  25 mg Oral BID  . pantoprazole (PROTONIX) IV  40 mg Intravenous Q12H  . potassium chloride SA  40 mEq Oral BID  . sertraline  50 mg Oral Daily  . spironolactone  50 mg Oral BID  . tamsulosin  0.4 mg Oral QPC supper  . torsemide  120 mg Oral BID   Continuous Infusions: . sodium chloride       LOS: 0 days    Time spent: 30 minutes    Jacara Benito Darleen Crocker, DO [facility] Pager [phone]  If 7PM-7AM, please contact night-coverage [email or web address] Password [email or web address] 09/08/2017, 11:11 AM

## 2017-09-08 NOTE — Consult Note (Signed)
Referring Provider: Laneta Simmers, MD Primary Care Physician:  [name], MD Primary Gastroenterologist:  Dr. Laural Golden  Reason for Consultation:    Melena and anemia.  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  He was seen by Dr. Doyle Askew yesterday and noted to have hemoglobin of 7.4.  He was symptomatic and therefore referred for further evaluation.  Patient is finishing up on second unit of PRBCs.  He feels much better.  He denies dyspnea at rest.  Patient states he did well for about 5 days after leaving the hospital on 08/27/2017 which is also for GI bleed and anemia.  Earlier this week he noted exertional dyspnea weakness and lightheadedness when he would stand up.  His stools have been black.  He denies rectal bleeding.  He remains on p.o. iron.  He has not taken aspirin in several weeks.  He does not take other OTC NSAIDs. During his last admission he had EGD revealing grade 1 esophageal varices portal hypertensive gastropathy and small gastric polyps without stigmata of bleed.  Small bowel given capsule study revealed 3 angiodysplasia but study was incomplete as it did not reach cecum.  He was not actively bleeding from these angiodysplasia.  Given capsule stayed in the stomach for over 3 hours and no blood was noted in the stomach as well.   He has history of GI bleed and iron deficiency anemia dating back to 2003.  Over the years he is undergone multiple endoscopic evaluations. He underwent EGD and colonoscopy in April 2018 for iron deficiency anemia and heme positive stool.  He had 3 small ulcerated gastric polyps.  These were snared.  He had grade 1 esophageal varices and portal hypertensive gastropathy.  I felt he may have been losing blood from these gastric polyps. Colonoscopy was unremarkable other than lipoma  at splenic flexure and external hemorrhoids.   Past Medical History:  Diagnosis Date  . Aortic stenosis 03/08/2012   a.  s/p tissue AVR 10/13 with Dr. [name];   b. Echo 10/13: mod LVH, EF 55-60%, tissue AVR not well seen, no leak, gradient not too high (mean 34mHg), MAC, mild MR, mild LAE, PASP 38  . Ascites    status post paracentesis with removal of 3.4 L of ascitic fluid  . Atrial fibrillation ([facility]    Permanent; off of Coumadin for now due to GI bleed  . AVM (arteriovenous malformation)    Recurrent GI bleeding requiring multiple transfusions  . CAD (coronary artery disease)    a. s/p CABG 2004;  b. [facility]:  LHC 8/13: Free radial to obtuse marginal patent, SVG-diagonal patent, LIMA-LAD patent, EF 65-70%, mean aortic valve gradient 42  . Carotid bruit 06/14/2011   a. pre-AVR dopplers 10/13: no sig ICA stenosis  . Chronic diastolic heart failure ([facility]   . Chronic kidney disease   . Cirrhosis ([facility]   . COPD (chronic obstructive pulmonary disease) ([facility]   . Depression   . DM2 (diabetes mellitus, type 2) (HCC)    at least 10 yrs  . Dyspnea   . GERD (gastroesophageal reflux disease)   . Gout   . H/O hiatal hernia   . Headache   . Hyperlipidemia   . Hyperlipidemia   . Hypertension    x 15 yrs  . Insomnia   . Iron deficiency anemia    Requiring intravenous  iron  . Mediastinal adenopathy 09/22/2011  . OSA (obstructive sleep apnea) 1999    USES CPAP  . Osteoarthritis   . Overweight(278.02)   . Presence of permanent cardiac pacemaker   . Thrombocytopenia ([place])     Past Surgical History:  Procedure Laterality Date  . ABLATION  04-18-14   AVN ablation by Dr Caryl Comes  . AORTIC VALVE REPLACEMENT  04/25/2012   Procedure: AORTIC VALVE REPLACEMENT (AVR);  Surgeon: Melrose Nakayama, MD;  Location: [place];  Service: Open Heart Surgery;  Laterality: N/A;  . AV NODE ABLATION N/A 04/18/2014   Procedure: AV NODE ABLATION;  Surgeon: Deboraha Sprang, MD;  Location: [facility] CATH LAB;   Service: Cardiovascular;  Laterality: N/A;  . CARPAL TUNNEL RELEASE    10/08/2003  . CATARACT EXTRACTION W/ INTRAOCULAR LENS IMPLANT Bilateral 09/28/2015 , 10/19/2015  . COLONOSCOPY WITH PROPOFOL N/A 10/28/2016   Procedure: COLONOSCOPY WITH PROPOFOL;  Surgeon: Rogene Houston, MD;  [facility];  Service: Endoscopy;  Laterality: N/A;  . CORONARY ANGIOPLASTY WITH STENT PLACEMENT  01/19/2005   drug eluting stent to high grade ostial stenosis of radial artery graft to OM  . CORONARY ARTERY BYPASS GRAFT   10/15/2002    [name]. [name], M.D.     . ESOPHAGOGASTRODUODENOSCOPY N/A 07/31/2013   Procedure: ESOPHAGOGASTRODUODENOSCOPY (EGD);  Surgeon: Milus Banister, MD;  Location: [place];  Service: Endoscopy;  Laterality: N/A;  . ESOPHAGOGASTRODUODENOSCOPY Left 01/11/2014   Procedure: ESOPHAGOGASTRODUODENOSCOPY (EGD);  Surgeon: Juanita Craver, MD;  Location[facility]COPY;  Service: Endoscopy;  Laterality: Left;  . ESOPHAGOGASTRODUODENOSCOPY (EGD) WITH PROPOFOL N/A 10/28/2016   Procedure: ESOPHAGOGASTRODUODENOSCOPY (EGD) WITH PROPOFOL;  Surgeon: Rogene Houston, MD;  [facility];  Service: Endoscopy;  Laterality: N/A;  . ESOPHAGOGASTRODUODENOSCOPY (EGD) WITH PROPOFOL N/A 08/25/2017   Procedure: ESOPHAGOGASTRODUODENOSCOPY (EGD) WITH PROPOFOL;  Surgeon: Rogene Houston, MD;  [facility];  Service: Endoscopy;  Laterality: N/A;  . GIVENS CAPSULE STUDY N/A 08/26/2017   Procedure: GIVENS CAPSULE STUDY;  Surgeon: Danie Binder, MD;  [facility];  Service: Endoscopy;  Laterality: N/A;  . HERNIA REPAIR    . KNEE ARTHROSCOPY Right 01/20/2016   Procedure: ARTHROSCOPY RIGHT KNEE WITH MENSICAL DEBRIDEMENT;  Surgeon: Gaynelle Arabian, MD;  Location: [facility];  Service: Orthopedics;  Laterality: Right;  . LEFT AND RIGHT HEART CATHETERIZATION WITH CORONARY/GRAFT ANGIOGRAM N/A 03/07/2012   Procedure: LEFT AND RIGHT HEART CATHETERIZATION WITH Beatrix Fetters;  Surgeon:  Sherren Mocha, MD;  Location: [facility] CATH LAB;  Service: Cardiovascular;  Laterality: N/A;  . lipoma surgery    . OTHER SURGICAL HISTORY  08/26/2011   [facility],  enteroscopy , revealing "three-four AVMs."   . PACEMAKER INSERTION  03-24-14   STJ Assurity single chamber pacemaker implanted by Dr Caryl Comes  . PARACENTESIS  12/2015  . PERMANENT PACEMAKER INSERTION N/A 03/24/2014   Procedure: PERMANENT PACEMAKER INSERTION;  Surgeon: Deboraha Sprang, MD;  [facility] C[facility] LAB;  Service: Cardiovascular;  Laterality: N/A;  . POLYPECTOMY  10/28/2016   Procedure: POLYPECTOMY;  Surgeon: Rogene Houston, MD;  [facility];  Service: Endoscopy;;  gastric  . RIGHT HEART CATHETERIZATION N/A 01/01/2014   Procedure: RIGHT HEART CATH;  Surgeon: Jolaine Artist, MD;  Location: [facility] CATH LAB;  Service: Cardiovascular;  Laterality: N/A;  . TEE WITHOUT CARDIOVERSION  03/07/2012   Procedure: TRANSESOPHAGEAL ECHOCARDIOGRAM (TEE);  Surgeon: Thayer Headings, MD;  Location: [place];  Service: Cardiovascular;  Laterality: N/A;  Prior to Admission medications   Medication Sig Start Date End Date Taking? Authorizing Provider  acetaminophen (TYLENOL) 500 MG tablet Take 1,000 mg by mouth at bedtime as needed for mild pain or headache.    Yes [provider]  allopurinol (ZYLOPRIM) 100 MG tablet Take 1 tablet (100 mg total) by mouth daily. 04/18/17  Yes Larey Dresser, MD  atorvastatin (LIPITOR) 20 MG tablet Take 20 mg by mouth daily at 6 PM.    Yes [provider]  colchicine 0.6 MG tablet Take 0.6 mg by mouth daily.    Yes [provider]  ferrous gluconate (FERGON) 324 MG tablet Take 324 mg by mouth 3 (three) times daily with meals.    Yes [provider]  gabapentin (NEURONTIN) 300 MG capsule Take 300 mg by mouth 3 (three) times daily. 12/31/14  Yes [provider]  insulin NPH Human (HUMULIN N,NOVOLIN N) 100 UNIT/ML injection Inject 65 Units into the skin 2 (two) times  daily before a meal.    Yes [provider]  insulin regular (NOVOLIN R,HUMULIN R) 100 units/mL injection Inject 30 Units into the skin 2 (two) times daily before a meal.    Yes [provider]  metolazone (ZAROXOLYN) 5 MG tablet Take 1 tablet (5 mg total) by mouth every 3 (three) days. 07/13/17  Yes Larey Dresser, MD  metoprolol succinate (TOPROL-XL) 25 MG 24 hr tablet Take 1 tablet (25 mg total) by mouth 2 (two) times daily. 08/27/17  Yes Tat, Shanon Brow, MD  Multiple Vitamins-Minerals ([employer] SPECTRAVITE ADULT 50+ PO) Take 1 tablet by mouth daily.   Yes [provider]  pantoprazole (PROTONIX) 40 MG tablet Take 1 tablet (40 mg total) by mouth 2 (two) times daily before a meal. 08/27/17  Yes Tat, Shanon Brow, MD  potassium chloride SA (K-DUR,KLOR-CON) 20 MEQ tablet Take 2 tablets (40 mEq total) by mouth 2 (two) times daily. 07/13/17  Yes Larey Dresser, MD  PROAIR HFA 108 [phone] Base) MCG/ACT inhaler Take 2 puffs by mouth 2 (two) times daily. 03/28/17  Yes [provider]  sertraline (ZOLOFT) 50 MG tablet Take 1 tablet (50 mg total) by mouth daily. 09/01/17  Yes Larey Dresser, MD  spironolactone (ALDACTONE) 50 MG tablet Take 1 tablet (50 mg total) by mouth 2 (two) times daily. 04/25/17  Yes Larey Dresser, MD  tamsulosin (FLOMAX) 0.4 MG CAPS capsule TAKE ONE CAPSULE BY MOUTH EVERY DAY AFTER SUPPER 07/11/17  Yes Larey Dresser, MD  torsemide (DEMADEX) 20 MG tablet Take 6 tablets (120 mg total) by mouth 2 (two) times daily. 06/29/17  Yes Larey Dresser, MD  vitamin C (ASCORBIC ACID) 500 MG tablet Take 500 mg by mouth 2 (two) times daily.   Yes [provider]  ciprofloxacin (CIPRO) 250 MG tablet Take 1 tablet (250 mg total) by mouth 2 (two) times daily. Patient not taking: Reported on 09/07/2017 08/27/17   Orson Eva, MD    Current Facility-Administered Medications  Medication Dose Route Frequency Provider Last Rate Last Dose  . 0.9 %  sodium chloride infusion    Intravenous Once Milton Ferguson, MD      . acetaminophen (TYLENOL) tablet 1,000 mg  1,000 mg Oral QHS PRN Reubin Milan, MD      . albuterol (PROVENTIL) (2.5 MG/3ML) 0.083% nebulizer solution 3 mL  3 mL Inhalation Q4H PRN Reubin Milan, MD      . allopurinol (ZYLOPRIM) tablet 100 mg  100 mg Oral  Daily Reubin Milan, MD   Stopped at 09/08/17 [phone]  . atorvastatin (LIPITOR) tablet 20 mg  20 mg Oral q1800 Reubin Milan, MD   20 mg at 09/07/17 2233  . colchicine tablet 0.6 mg  0.6 mg Oral Daily Reubin Milan, MD   Stopped at 09/08/17 [phone]  . gabapentin (NEURONTIN) capsule 300 mg  300 mg Oral TID Reubin Milan, MD   Stopped at 09/08/17 [phone]  . insulin aspart (novoLOG) injection 0-20 Units  0-20 Units Subcutaneous TID WC Reubin Milan, MD   4 Units at 09/08/17 [phone]  . metolazone (ZAROXOLYN) tablet 5 mg  5 mg Oral Q72H Reubin Milan, MD   Stopped at 09/08/17 [phone]  . metoprolol succinate (TOPROL-XL) 24 hr tablet 25 mg  25 mg Oral BID Reubin Milan, MD   Stopped at 09/08/17 [phone]  . ondansetron (ZOFRAN) tablet 4 mg  4 mg Oral Q6H PRN Reubin Milan, MD       Or  . ondansetron [facility]) injection 4 mg  4 mg Intravenous Q6H PRN Reubin Milan, MD      . pantoprazole (PROTONIX) injection 40 mg  40 mg Intravenous Q12H Reubin Milan, MD   Stopped at 09/08/17 0900  . potassium chloride SA (K-DUR,KLOR-CON) CR tablet 40 mEq  40 mEq Oral BID Reubin Milan, MD   Stopped at 09/08/17 [phone]  . sertraline (ZOLOFT) tablet 50 mg  50 mg Oral Daily Reubin Milan, MD   Stopped at 09/08/17 [phone]  . spironolactone (ALDACTONE) tablet 50 mg  50 mg Oral BID Reubin Milan, MD   Stopped at 09/08/17 0900  . tamsulosin (FLOMAX) capsule 0.4 mg  0.4 mg Oral QPC supper Reubin Milan, MD   0.4 mg at 09/07/17 2234  . torsemide (DEMADEX) tablet 120 mg  120 mg Oral BID Reubin Milan, MD   Stopped at 09/08/17 0900    Allergies as of 09/07/2017 -  Review Complete 09/07/2017  Allergen Reaction Noted  . Codeine Other (See Comments) and Palpitations 12/27/2010  . Diltiazem hcl Itching   . Niacin Other (See Comments)   . Aspirin  01/20/2016  . Diltiazem hcl Other (See Comments) and Hives 10/12/2015    Family History  Problem Relation Age of Onset  . Hypertension Father   . Diabetes Father   . Heart disease Father   . COPD Sister   . Cancer Maternal Aunt        Breast cancer   . Cancer Maternal Aunt        Breast cancer   . Diabetes Son   . Cancer Daughter        Cervical cancer  . Coronary artery disease Unknown        FAMILY HISTORY    Social History   Socioeconomic History  . Marital status: Married    Spouse name: Not on file  . Number of children: 3  . Years of education: Not on file  . Highest education level: Not on file  Social Needs  . Financial resource strain: Not on file  . Food insecurity - worry: Not on file  . Food insecurity - inability: Not on file  . Transportation needs - medical: Not on file  . Transportation needs - non-medical: Not on file  Occupational History    Comment: Associate Professor at [employer]  . Smoking status: Former Smoker    Packs/day: 1.00    Years: 30.00  Pack years: 30.00    Types: Cigarettes    Last attempt to quit: 07/25/2000    Years since quitting: 17.1  . Smokeless tobacco: Never Used  . Tobacco comment: Quit smoking in 2002  Substance and Sexual Activity  . Alcohol use: No    Alcohol/week: 0.6 oz    Types: 1 Shots of liquor per week    Comment: stopped drinking alcohol  . Drug use: No  . Sexual activity: Yes  Other Topics Concern  . Not on file  Social History Narrative   Lives in [place], [place] with wife.     Review of Systems: See HPI, otherwise normal ROS  Physical Exam: Temp:  [97.5 F (36.4 C)-98.7 F (37.1 C)] 97.5 F (36.4 C) (02/15 0939) Pulse Rate:  [74-78] 75 (02/15 0939) Resp:  [18] 18 (02/15 0939) BP: (80-117)/(32-73) 98/49  (02/15 0939) SpO2:  [95 %-100 %] 99 % (02/15 0939) Weight:  [265 lb (120.2 kg)-272 lb 7.8 oz (123.6 kg)] 272 lb 7.8 oz (123.6 kg) (02/14 2106) Last BM Date: 09/07/17  Patient is alert and in no acute distress. Asterixis absent. Conjunctiva is pale and sclerae nonicteric. Oropharyngeal mucosa is normal. No neck masses or thyromegaly noted. Cardiac exam with regular rhythm normal S1 and prominent S2.  Faint systolic ejection murmur best heard at left upper sternal border. Lungs are clear to auscultation. Abdomen is distended but not tense.  Bowel sounds are normal.  Both flanks are bulging and dull.  Liver edge is palpable 5 cm below RCM.  Spleen is nonpalpable. Patient just had a bowel movement.  His stool was soft and black.  It is very strongly heme positive. He has trace edema around ankles.   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 Iron) his stool however is strongly guaiac positive.  Patient was hospitalized 2 weeks ago and received 2 units of PRBCs.  EGD revealed no bleeding lesion and small bowel given capsule study although incomplete did reveal 3 angiodysplasia and small bowel without bleeding.    He also underwent EGD and colonoscopy in April 2018.  Colonoscopy was unremarkable and he had 3 gastric polyps removed and these were hyperplastic. It is interesting to know that he has been dealing with GI bleed and iron deficiency anemia dating back to 2003.  He seemed to do better after he had valve  replacement and was off anticoagulant.  Now he is having GI bleed while he is not even taking iron. Suspect source to be small bowel.  Hopefully GI bleeding scan if positive would shed light as to the location which will determine next step.  If source of GI bleed is determined to be stomach repeat EGD would be considered and if it is small bowel he will be referred to [facility] for small bowel endoscopy. Source of recurrent/chronic GI bleed could be liver as well.  Cirrhotic ascites.  Ascites is not tense.  Therefore he does not need abdominal tap for now.  Recommendations;  Hold p.o. iron at the time of discharge. GI bleeding scan today. Further recommendations to follow.    LOS: 0 days   Najeeb Rehman  09/08/2017, 12:13 PM

## 2017-09-08 NOTE — Progress Notes (Signed)
Pt currently NPO however pt received a meal tray and has eaten his eggs and drank apple juice. MD informed. Pt and his family have been reeducated on nothing by mouth. Will continue to monitor patient.

## 2017-09-08 NOTE — Progress Notes (Signed)
BP 80/32, MD made aware. PRBC's  Are available and will start transfusion.

## 2017-09-08 NOTE — Progress Notes (Signed)
GI bleeding scan completed. No pooling. Will review with radiologist.

## 2017-09-08 NOTE — Progress Notes (Signed)
PRBC'S verified by 2 RN's in patient's room and by lab personal at issue of blood. Unable to scan PRBC's into patient's chart due to this unit previously being assigned elsewhere but released on 09/08/17 for Mr. Cutler Sunday use. Unit # [id number], patient's armband [id number]. Blood transfusion started at a rate of 185ml/hr.

## 2017-09-09 LAB — CBC
HEMATOCRIT: 28.3 % — AB (ref 39.0–52.0)
HEMOGLOBIN: 8.4 g/dL — AB (ref 13.0–17.0)
MCH: 26.3 pg (ref 26.0–34.0)
MCHC: 29.7 g/dL — ABNORMAL LOW (ref 30.0–36.0)
MCV: 88.4 fL (ref 78.0–100.0)
Platelets: 175 10*3/uL (ref 150–400)
RBC: 3.2 MIL/uL — AB (ref 4.22–5.81)
RDW: 19.5 % — ABNORMAL HIGH (ref 11.5–15.5)
WBC: 9.4 10*3/uL (ref 4.0–10.5)

## 2017-09-09 LAB — COMPREHENSIVE METABOLIC PANEL
ALT: 13 U/L — ABNORMAL LOW (ref 17–63)
ANION GAP: 14 (ref 5–15)
AST: 20 U/L (ref 15–41)
Albumin: 3.3 g/dL — ABNORMAL LOW (ref 3.5–5.0)
Alkaline Phosphatase: 66 U/L (ref 38–126)
BUN: 91 mg/dL — ABNORMAL HIGH (ref 6–20)
CHLORIDE: 95 mmol/L — AB (ref 101–111)
CO2: 22 mmol/L (ref 22–32)
CREATININE: 2.39 mg/dL — AB (ref 0.61–1.24)
Calcium: 9.1 mg/dL (ref 8.9–10.3)
GFR calc non Af Amer: 26 mL/min — ABNORMAL LOW (ref >60)
GFR, EST AFRICAN AMERICAN: 30 mL/min — AB (ref >60)
Glucose, Bld: 164 mg/dL — ABNORMAL HIGH (ref 65–99)
POTASSIUM: 3.6 mmol/L (ref 3.5–5.1)
SODIUM: 131 mmol/L — AB (ref 135–145)
Total Bilirubin: 1.7 mg/dL — ABNORMAL HIGH (ref 0.3–1.2)
Total Protein: 6.8 g/dL (ref 6.5–8.1)

## 2017-09-09 LAB — GLUCOSE, CAPILLARY
Glucose-Capillary: 162 mg/dL — ABNORMAL HIGH (ref 65–99)
Glucose-Capillary: 281 mg/dL — ABNORMAL HIGH (ref 65–99)

## 2017-09-09 NOTE — Plan of Care (Signed)
  Education: Knowledge of General Education information will improve 09/09/2017 0035 - Progressing by Cassandria Anger, RN   Activity: Risk for activity intolerance will decrease 09/09/2017 0035 - Progressing by Cassandria Anger, RN   Coping: Level of anxiety will decrease 09/09/2017 0035 - Progressing by Cassandria Anger, RN   Pain Managment: General experience of comfort will improve 09/09/2017 0035 - Progressing by Cassandria Anger, RN   Safety: Ability to remain free from injury will improve 09/09/2017 0035 - Progressing by Cassandria Anger, RN

## 2017-09-09 NOTE — Discharge Summary (Signed)
Physician Discharge Summary  GRASON BRAILSFORD SEG:[id number] DOB: [birth year] DOA: 09/07/2017  PCP: [name], MD  Admit date: 09/07/2017  Discharge date: 09/09/2017  Admitted From:Home  Disposition:  Home  Recommendations for Outpatient Follow-up:  1. Follow up with PCP in 2 weeks 2. Follow up with Dr. Laural Golden GI as recommended on Monday 2/18. Office number given for follow up.  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  He has undergone a 2 unit PRBC transfusion with improvement in his anemia and a discharge hemoglobin of 8.4 as noted below.  He is no longer symptomatic and has not had any further episodes of melena and is able to tolerate a diet.  He has had a nuclear GI scan with no significant findings noted.  He is currently noted to be stable for discharge and will follow up with Dr. Laural Golden of GI as scheduled on 2/18 for repeat H/H and possible endoscopy.  He will remain off iron supplementation for now.   Discharge Diagnoses:  Principal Problem:   Symptomatic anemia Active Problems:   CAD, ARTERY BYPASS GRAFT   Hyperlipidemia   Essential hypertension   Permanent atrial fibrillation (HCC)   Type 2 diabetes mellitus (HCC)   CKD (chronic kidney disease)   OSA (obstructive sleep apnea)   Cirrhosis of liver with ascites (HCC)    Hyponatremia   COPD (chronic obstructive pulmonary disease) (HCC)   Gout  1. Symptomatic acute blood loss anemia-resolved.  Status post 2 unit PRBC transfusion.  Continue oral Protonix twice daily and hold off on oral iron supplementation.  Follow-up with Dr. Laural Golden with GI by Monday of next week as recommended.  No longer symptomatic at this time. 2. Liver cirrhosis with ascites.  Abdomen is not significantly tense at this time and paracentesis can be ordered as needed. 3. Hyponatremia likely secondary to a ascites and diuretic use.  Continue to monitor with repeat labs.  Has remained stable and slightly improved. 4. COPD.  Supplemental oxygen and bronchodilators as needed. 5. Type 2 diabetes.  Continue current management. 6. Permanent atrial fibrillation with chads vasc of 5.  Continue metoprolol for rate control and avoid anticoagulation.  He is status post AV node ablation with PPM. 7. Essential hypertension.  Monitor closely due to soft BP.  Continue diuretics for now and metoprolol. 8. Dyslipidemia.  Continue on atorvastatin and monitor hepatic function. 9. CKD.  Monitor renal function and electrolytes. 10. OSA.  Not on CPAP at home and declined use while in hospital.   Discharge Instructions  Discharge Instructions    Call MD for:  difficulty breathing, headache or visual disturbances   Complete by:  As directed    Call MD for:  extreme fatigue   Complete by:  As directed    Diet - low sodium heart healthy   Complete by:  As directed    Increase activity slowly   Complete by:  As directed  Allergies as of 09/09/2017      Reactions   Codeine Other (See Comments), Palpitations   Hurting in chest   Diltiazem Hcl Itching   Niacin Other (See Comments)   Felt a "severe burning sensation" Hot flashes   Aspirin    History of Bleeding ulcers    Diltiazem Hcl Other (See Comments), Hives   Gets hot      Medication List    STOP taking these medications   ciprofloxacin 250  MG tablet Commonly known as:  CIPRO   ferrous gluconate 324 MG tablet Commonly known as:  FERGON     TAKE these medications   acetaminophen 500 MG tablet Commonly known as:  TYLENOL Take 1,000 mg by mouth at bedtime as needed for mild pain or headache.   allopurinol 100 MG tablet Commonly known as:  ZYLOPRIM Take 1 tablet (100 mg total) by mouth daily.   atorvastatin 20 MG tablet Commonly known as:  LIPITOR Take 20 mg by mouth daily at 6 PM.   colchicine 0.6 MG tablet Take 0.6 mg by mouth daily.   [employer] SPECTRAVITE ADULT 50+ PO Take 1 tablet by mouth daily.   gabapentin 300 MG capsule Commonly known as:  NEURONTIN Take 300 mg by mouth 3 (three) times daily.   insulin NPH Human 100 UNIT/ML injection Commonly known as:  HUMULIN N,NOVOLIN N Inject 65 Units into the skin 2 (two) times daily before a meal.   insulin regular 100 units/mL injection Commonly known as:  NOVOLIN R,HUMULIN R Inject 30 Units into the skin 2 (two) times daily before a meal.   metolazone 5 MG tablet Commonly known as:  ZAROXOLYN Take 1 tablet (5 mg total) by mouth every 3 (three) days.   metoprolol succinate 25 MG 24 hr tablet Commonly known as:  TOPROL-XL Take 1 tablet (25 mg total) by mouth 2 (two) times daily.   pantoprazole 40 MG tablet Commonly known as:  PROTONIX Take 1 tablet (40 mg total) by mouth 2 (two) times daily before a meal.   potassium chloride SA 20 MEQ tablet Commonly known as:  K-DUR,KLOR-CON Take 2 tablets (40 mEq total) by mouth 2 (two) times daily.   PROAIR HFA 108 (90 Base) MCG/ACT inhaler Generic drug:  albuterol Take 2 puffs by mouth 2 (two) times daily.   sertraline 50 MG tablet Commonly known as:  ZOLOFT Take 1 tablet (50 mg total) by mouth daily.   spironolactone 50 MG tablet Commonly known as:  ALDACTONE Take 1 tablet (50 mg total) by mouth 2 (two) times daily.   tamsulosin 0.4 MG Caps capsule Commonly known as:  FLOMAX TAKE ONE CAPSULE BY MOUTH EVERY  DAY AFTER SUPPER   torsemide 20 MG tablet Commonly known as:  DEMADEX Take 6 tablets (120 mg total) by mouth 2 (two) times daily.   vitamin C 500 MG tablet Commonly known as:  ASCORBIC ACID Take 500 mg by mouth 2 (two) times daily.      Follow-up Information    Rehman, Mechele Dawley, MD Follow up in 3 day(s).   Specialty:  Gastroenterology Why:  Please call office first thing in AM to ensure follow up. Contact information: [address], [address] [place] [address] [phone]        [name], MD Follow up in 2 week(s).   Specialty:  Family Medicine Contact information: [address] [place] [address] [phone]          Allergies  Allergen Reactions  . Codeine Other (See Comments) and Palpitations    Hurting in chest  . Diltiazem Hcl Itching  . Niacin Other (See Comments)    Felt a "severe burning sensation" Hot flashes  . Aspirin     History of Bleeding ulcers   . Diltiazem Hcl Other (See Comments) and Hives    Gets hot    Consultations:  GI Dr. Laural Golden   Procedures/Studies: Dg Chest 2 View  Result Date: 08/24/2017 CLINICAL DATA:  Pt sent over for low hemoglobin 7.3. States dark tarry stool x1 week and generalized weakness,COPD, CHF EXAM: CHEST  2 VIEW COMPARISON:  06/01/2017 FINDINGS: Patient's left-sided transvenous pacemaker with leads to the right ventricle. Status post median sternotomy. The heart is enlarged. There is mild prominence of interstitial markings, stable in appearance. There are no focal consolidations. There is right costophrenic angle blunting. IMPRESSION: 1. Cardiomegaly. 2. Suspect mild interstitial pulmonary edema. Electronically Signed   By: Nolon Nations M.D.   On: 08/24/2017 14:12   Nm Gi Blood Loss  Result Date: 09/08/2017 CLINICAL DATA:  GI bleed.  Melena. EXAM: NUCLEAR MEDICINE GASTROINTESTINAL BLEEDING SCAN TECHNIQUE: Sequential abdominal images were obtained following intravenous administration of  Tc-2m labeled red blood cells. RADIOPHARMACEUTICALS:  24 mCi Tc-24m pertechnetate in-vitro labeled red cells. COMPARISON:  None FINDINGS: There is no evidence of active bleeding during the examination, carried out for 1 hour. IMPRESSION: Negative GI bleeding study. Electronically Signed   By: Lorriane Shire M.D.   On: 09/08/2017 16:17   US Paracentesis  Result Date: 08/25/2017 INDICATION: Cirrhosis, ascites EXAM: ULTRASOUND GUIDED THERAPEUTIC PARACENTESIS MEDICATIONS: None. COMPLICATIONS: None immediate. PROCEDURE: Procedure, benefits, and risks of procedure were discussed with patient. Written informed consent for procedure was obtained. Time out protocol followed. Adequate collection of ascites localized by ultrasound in LEFT lower quadrant. Skin prepped and draped in usual sterile fashion. Skin and soft tissues anesthetized with 10 mL of 1% lidocaine. 5 [place] Yueh catheter placed into peritoneal cavity. 4.2 L of yellow ascitic fluid fluid aspirated by vacuum bottle suction. Procedure tolerated well by patient without immediate complication. FINDINGS: As above IMPRESSION: Successful ultrasound-guided paracentesis yielding 4.2 liters of peritoneal fluid. Electronically Signed   By: Lavonia Dana M.D.   On: 08/25/2017 16:10   US Paracentesis  Result Date: 08/11/2017 INDICATION: Recurrent ascites EXAM: ULTRASOUND-GUIDED PARACENTESIS COMPARISON:  Previous paracentesis MEDICATIONS: 10 cc 1% lidocaine COMPLICATIONS: None immediate. TECHNIQUE: Informed written consent was obtained from the patient after a discussion of the risks, benefits and alternatives to treatment. A timeout was performed prior to the initiation of the procedure. Initial ultrasound scanning demonstrates a large amount of ascites within the right lower abdominal quadrant. The right lower abdomen was prepped and draped in the usual sterile fashion. 1% lidocaine with epinephrine was used for local anesthesia. Under direct ultrasound guidance, a  19 gauge, 7-cm, Yueh catheter was introduced. An ultrasound image was saved for documentation purposed. The paracentesis was performed. The catheter was removed and a dressing was applied. The patient tolerated the procedure well without immediate post procedural complication. FINDINGS: A total of approximately 3.6 liters of light amber fluid was removed. Samples were sent to the laboratory as requested by the clinical team. IMPRESSION: Successful ultrasound-guided paracentesis yielding 3.6 liters of peritoneal fluid. Read by Lavonia Drafts [facility] Electronically Signed   By: Marijo Conception, M.D.   On: 08/11/2017 14:52     Discharge Exam: Vitals:   09/08/17 2320 09/09/17 0500  BP: (!) 95/47 [name])  90/44  Pulse: 77 74  Resp:  18  Temp:  98 F (36.7 C)  SpO2:  98%   Vitals:   09/08/17 1223 09/08/17 1619 09/08/17 2320 09/09/17 0500  BP: (!) 119/55 (!) 111/57 (!) 95/47 (!) 90/44  Pulse: 74 74 77 74  Resp: 18 18  18   Temp: 97.7 F (36.5 C) (!) 97.5 F (36.4 C)  98 F (36.7 C)  TempSrc: Oral Oral  Oral  SpO2: 100% 98%  98%  Weight:      Height:        General: Pt is alert, awake, not in acute distress Cardiovascular: RRR, S1/S2 +, no rubs, no gallops Respiratory: CTA bilaterally, no wheezing, no rhonchi Abdominal: Soft, NT, ND, bowel sounds + Extremities: no edema, no cyanosis    The results of significant diagnostics from this hospitalization (including imaging, microbiology, ancillary and laboratory) are listed below for reference.     Microbiology: No results found for this or any previous visit (from the past 240 hour(s)).   Labs: BNP (last 3 results) Recent Labs    09/12/16 0924  BNP 532.9*   Basic Metabolic Panel: Recent Labs  Lab 09/07/17 1612 09/08/17 1640 09/09/17 0617  NA 128* 134* 131*  K 4.0 4.1 3.6  CL 92* 97* 95*  CO2 21* 24 22  GLUCOSE 323* 135* 164*  BUN 95* 92* 91*  CREATININE 2.83* 2.62* 2.39*  CALCIUM 8.8* 9.7 9.1   Liver Function  Tests: Recent Labs  Lab 09/07/17 1612 09/08/17 1640 09/09/17 0617  AST 25 22 20   ALT 16* 15* 13*  ALKPHOS 66 74 66  BILITOT 1.0 2.6* 1.7*  PROT 7.0 7.4 6.8  ALBUMIN 3.3* 3.6 3.3*   No results for input(s): LIPASE, AMYLASE in the last 168 hours. No results for input(s): AMMONIA in the last 168 hours. CBC: Recent Labs  Lab 09/07/17 1612 09/08/17 1640 09/09/17 0617  WBC 10.2 10.2 9.4  NEUTROABS 8.1*  --   --   HGB 7.2* 8.8* 8.4*  HCT 24.7* 30.0* 28.3*  MCV 88.2 88.0 88.4  PLT 184 181 175   Cardiac Enzymes: No results for input(s): CKTOTAL, CKMB, CKMBINDEX, TROPONINI in the last 168 hours. BNP: Invalid input(s): POCBNP CBG: Recent Labs  Lab 09/08/17 1137 09/08/17 1616 09/08/17 2048 09/08/17 2234 09/09/17 0727  GLUCAP 182* 128* 246* 189* 162*   D-Dimer No results for input(s): DDIMER in the last 72 hours. Hgb A1c No results for input(s): HGBA1C in the last 72 hours. Lipid Profile No results for input(s): CHOL, HDL, LDLCALC, TRIG, CHOLHDL, LDLDIRECT in the last 72 hours. Thyroid function studies No results for input(s): TSH, T4TOTAL, T3FREE, THYROIDAB in the last 72 hours.  Invalid input(s): FREET3 Anemia work up No results for input(s): VITAMINB12, FOLATE, FERRITIN, TIBC, IRON, RETICCTPCT in the last 72 hours. Urinalysis    Component Value Date/Time   COLORURINE YELLOW 08/25/2017 0842   APPEARANCEUR CLEAR 08/25/2017 0842   LABSPEC 1.009 08/25/2017 0842   PHURINE 5.0 08/25/2017 0842   GLUCOSEU NEGATIVE 08/25/2017 0842   HGBUR NEGATIVE 08/25/2017 0842   BILIRUBINUR NEGATIVE 08/25/2017 0842   KETONESUR NEGATIVE 08/25/2017 0842   PROTEINUR NEGATIVE 08/25/2017 0842   UROBILINOGEN 0.2 04/17/2012 1430   NITRITE NEGATIVE 08/25/2017 0842   LEUKOCYTESUR NEGATIVE 08/25/2017 0842   Sepsis Labs Invalid input(s): PROCALCITONIN,  WBC,  LACTICIDVEN Microbiology No results found for this or any previous visit (from the past 240 hour(s)).   Time coordinating  discharge: Over 30 minutes  SIGNED:  Pratik D Manuella Ghazi, DO [facility] 09/09/2017, 9:29 AM Pager [phone]  If 7PM-7AM, please contact night-coverage [email or web address] Password [email or web address]

## 2017-09-11 ENCOUNTER — Other Ambulatory Visit (INDEPENDENT_AMBULATORY_CARE_PROVIDER_SITE_OTHER): Payer: Self-pay | Admitting: *Deleted

## 2017-09-11 DIAGNOSIS — D649 Anemia, unspecified: Secondary | ICD-10-CM

## 2017-09-11 DIAGNOSIS — D509 Iron deficiency anemia, unspecified: Secondary | ICD-10-CM

## 2017-09-11 LAB — TYPE AND SCREEN
ABO/RH(D): A POS
Antibody Screen: POSITIVE
DONOR AG TYPE: NEGATIVE
Donor AG Type: NEGATIVE
UNIT DIVISION: 0
UNIT DIVISION: 0

## 2017-09-11 LAB — BPAM RBC
BLOOD PRODUCT EXPIRATION DATE: 201902282359
Blood Product Expiration Date: 201902272359
ISSUE DATE / TIME: 201902150431
ISSUE DATE / TIME: 201902150906
Unit Type and Rh: 6200
Unit Type and Rh: 6200

## 2017-09-14 ENCOUNTER — Ambulatory Visit (HOSPITAL_COMMUNITY): Payer: Medicare Other

## 2017-09-14 ENCOUNTER — Encounter (HOSPITAL_COMMUNITY): Payer: Self-pay | Admitting: Cardiology

## 2017-09-14 ENCOUNTER — Ambulatory Visit (HOSPITAL_COMMUNITY)
Admission: RE | Admit: 2017-09-14 | Discharge: 2017-09-14 | Disposition: A | Discharge status: Home / Self Care | Payer: Medicare Other | Source: Ambulatory Visit | Attending: Cardiology | Admitting: Cardiology

## 2017-09-14 ENCOUNTER — Other Ambulatory Visit (HOSPITAL_COMMUNITY)
Admission: RE | Admit: 2017-09-14 | Discharge: 2017-09-14 | Disposition: A | Discharge status: Home / Self Care | Payer: Medicare Other | Source: Ambulatory Visit | Attending: Internal Medicine | Admitting: Internal Medicine

## 2017-09-14 ENCOUNTER — Telehealth (INDEPENDENT_AMBULATORY_CARE_PROVIDER_SITE_OTHER): Payer: Self-pay | Admitting: *Deleted

## 2017-09-14 VITALS — BP 110/55 | HR 76 | Wt 275.4 lb

## 2017-09-14 DIAGNOSIS — I2510 Atherosclerotic heart disease of native coronary artery without angina pectoris: Secondary | ICD-10-CM | POA: Diagnosis not present

## 2017-09-14 DIAGNOSIS — K7460 Unspecified cirrhosis of liver: Secondary | ICD-10-CM | POA: Insufficient documentation

## 2017-09-14 DIAGNOSIS — G4733 Obstructive sleep apnea (adult) (pediatric): Secondary | ICD-10-CM | POA: Diagnosis not present

## 2017-09-14 DIAGNOSIS — N183 Chronic kidney disease, stage 3 (moderate): Secondary | ICD-10-CM | POA: Insufficient documentation

## 2017-09-14 DIAGNOSIS — D696 Thrombocytopenia, unspecified: Secondary | ICD-10-CM | POA: Insufficient documentation

## 2017-09-14 DIAGNOSIS — R188 Other ascites: Secondary | ICD-10-CM | POA: Insufficient documentation

## 2017-09-14 DIAGNOSIS — N184 Chronic kidney disease, stage 4 (severe): Secondary | ICD-10-CM | POA: Diagnosis not present

## 2017-09-14 DIAGNOSIS — K3189 Other diseases of stomach and duodenum: Secondary | ICD-10-CM | POA: Diagnosis not present

## 2017-09-14 DIAGNOSIS — M109 Gout, unspecified: Secondary | ICD-10-CM | POA: Insufficient documentation

## 2017-09-14 DIAGNOSIS — M129 Arthropathy, unspecified: Secondary | ICD-10-CM | POA: Insufficient documentation

## 2017-09-14 DIAGNOSIS — K7469 Other cirrhosis of liver: Secondary | ICD-10-CM

## 2017-09-14 DIAGNOSIS — K921 Melena: Secondary | ICD-10-CM | POA: Diagnosis not present

## 2017-09-14 DIAGNOSIS — K219 Gastro-esophageal reflux disease without esophagitis: Secondary | ICD-10-CM | POA: Insufficient documentation

## 2017-09-14 DIAGNOSIS — K7581 Nonalcoholic steatohepatitis (NASH): Secondary | ICD-10-CM | POA: Insufficient documentation

## 2017-09-14 DIAGNOSIS — I350 Nonrheumatic aortic (valve) stenosis: Secondary | ICD-10-CM | POA: Insufficient documentation

## 2017-09-14 DIAGNOSIS — I5032 Chronic diastolic (congestive) heart failure: Secondary | ICD-10-CM | POA: Diagnosis not present

## 2017-09-14 DIAGNOSIS — Z8249 Family history of ischemic heart disease and other diseases of the circulatory system: Secondary | ICD-10-CM | POA: Insufficient documentation

## 2017-09-14 DIAGNOSIS — K761 Chronic passive congestion of liver: Secondary | ICD-10-CM | POA: Insufficient documentation

## 2017-09-14 DIAGNOSIS — E785 Hyperlipidemia, unspecified: Secondary | ICD-10-CM | POA: Diagnosis not present

## 2017-09-14 DIAGNOSIS — Z9989 Dependence on other enabling machines and devices: Secondary | ICD-10-CM

## 2017-09-14 DIAGNOSIS — F329 Major depressive disorder, single episode, unspecified: Secondary | ICD-10-CM | POA: Diagnosis not present

## 2017-09-14 DIAGNOSIS — D649 Anemia, unspecified: Secondary | ICD-10-CM | POA: Diagnosis not present

## 2017-09-14 DIAGNOSIS — E1122 Type 2 diabetes mellitus with diabetic chronic kidney disease: Secondary | ICD-10-CM | POA: Insufficient documentation

## 2017-09-14 DIAGNOSIS — I130 Hypertensive heart and chronic kidney disease with heart failure and stage 1 through stage 4 chronic kidney disease, or unspecified chronic kidney disease: Secondary | ICD-10-CM | POA: Diagnosis not present

## 2017-09-14 DIAGNOSIS — R0602 Shortness of breath: Secondary | ICD-10-CM | POA: Diagnosis not present

## 2017-09-14 DIAGNOSIS — R5383 Other fatigue: Secondary | ICD-10-CM | POA: Diagnosis not present

## 2017-09-14 DIAGNOSIS — J449 Chronic obstructive pulmonary disease, unspecified: Secondary | ICD-10-CM | POA: Insufficient documentation

## 2017-09-14 DIAGNOSIS — I482 Chronic atrial fibrillation: Secondary | ICD-10-CM | POA: Insufficient documentation

## 2017-09-14 DIAGNOSIS — D509 Iron deficiency anemia, unspecified: Secondary | ICD-10-CM | POA: Insufficient documentation

## 2017-09-14 DIAGNOSIS — K766 Portal hypertension: Secondary | ICD-10-CM | POA: Insufficient documentation

## 2017-09-14 DIAGNOSIS — Z951 Presence of aortocoronary bypass graft: Secondary | ICD-10-CM | POA: Diagnosis not present

## 2017-09-14 DIAGNOSIS — I4821 Permanent atrial fibrillation: Secondary | ICD-10-CM

## 2017-09-14 DIAGNOSIS — Z953 Presence of xenogenic heart valve: Secondary | ICD-10-CM | POA: Insufficient documentation

## 2017-09-14 HISTORY — PX: IR PARACENTESIS: IMG2679

## 2017-09-14 LAB — HEMOGLOBIN AND HEMATOCRIT, BLOOD
HEMATOCRIT: 26.8 % — AB (ref 39.0–52.0)
HEMOGLOBIN: 7.8 g/dL — AB (ref 13.0–17.0)

## 2017-09-14 MED ORDER — METOLAZONE 5 MG PO TABS: 5.0000 mg | ORAL_TABLET | ORAL | 3 refills | Status: DC

## 2017-09-14 MED ORDER — LIDOCAINE 2% (20 MG/ML) 5 ML SYRINGE
INTRAMUSCULAR | Status: AC
  Filled 2017-09-14: qty 10

## 2017-09-14 NOTE — Telephone Encounter (Signed)
Patient's wife called left message stating patient had his blood work done at [facility] and they told them the results will be back in 30 minutes, per wife the blood work was done 11:07 and it is now 11:20 and they have not received the results. Please advise

## 2017-09-14 NOTE — Progress Notes (Signed)
Patient ID: [name], male   DOB: 29-Aug-1945, 72 y.o.   MRN: [id number]    Advanced Heart Failure Clinic Note   GI: Dr Laural Golden Pulmonary: Dr Melvyn Novas  PCP: Marjean Donna [facility])  Cardiology: Dr. Aundra Dubin  [name] is a 72 y.o. 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  Last LHC in 8/13 showed patent grafts.  AVR was in 10/13.  Last echo in 1/18 showed EF 60-65%, normal bioprosthetic aortic valve.   July 2015:  Admitted to the hospital for GI bleed requiring 2 PRBCs. No additional GI work-up with recent EGD showing gastritis.  He had an AV nodal ablation in 9/15 with significant improvement after this.  He has St Jude PPM.   In 6/17, had right knee operation.    He was admitted in 1/19 with GI bleeding, transfused 2 units PRBCs.  EGD showed grade I varices, portal hypertensive gastropathy, and gastric polyps.  Capsule endoscopy showed nonbleeding AVMs.    He was admitted again in 2/19 with GI bleeding.  He was again transfused.  Nuclear medicine bleeding study was negative.   [name] presents today for followup of chronic diastolic CHF.  He has been getting paracenteses again more frequently, most recently earlier this month.  He is still drinking a huge amount of fluid daily despite many attempts at education.  Recently, he has developed increased abdominal distention again.  He is fatigued and short of breath walking short distances.  Used a wheelchair to come in today.  He had an episode of melena yesterday.  He continues to take metolazone every 3rd day. Weight is down 2 lbs compared to last appointment.    Labs (8/14) LDL 38 Labs (3/15) K 3.4, creatinine 1.7, BUN 55  Labs (10/15/13) K 4.0 Creatinine 1.6 BUN 32 Labs (10/29/13) K 4.0 Creatinine 1.6  Labs (4/15) K 4.2 Creatinine 1.32 => 1.5, HCT  35.2 Labs (12/11/13) K 3.6 Creatinine 1.56 Pro BNP 1152 Hemoglobin 9.6  Labs (01/12/14) K 3.0, creatinine 2.0, hemoglobin 8.8 Labs (01/31/14) K 3.3, creatinine 1.49 Labs (8/15) K 3.9, creatinine 1.89 Labs (9/15): K 3.7, creatinine 1.6, AST 48, ALT 52, TSH normal Labs (10/15): K 3.5, creatinine 1.58, BNP 780 Labs (3/16): K 3.5, creatinine 1.6 Labs 12/03/2014: K 4.4 Creatinine 1.55 Labs 12/12/2014: K 3.7 Creatinine 1.48, LDL 50 Labs 8/16: K 3.8, creatinine 2.42 Labs 04/07/2015: K 4.3, Creatinine 2.22, BNP 169 Labs (11/16): K 4, creatinine 1.78 => 1.55, BNP 120 Labs 11/11/2015 K 3.7 Creatinine 1.53  Labs (6/17): K 3.8, creatinine 1.52 Labs (12/17): K 3.5, creatinine 1.95, LDL 32 Labs (1/18): K 4.2, creatinine 2.51 Labs (4/18): K 3.6, creatinine 2.5, hgb 10.2 Labs (10/18): K 3.5, creatinine 2.72, hgb 10.4 Labs (2/19): K 3.6, creatinine 2.39, hgb 8.4  PMH: 1. CAD: s/p CABG 2004. Last LHC (8/13) with patent free radial-OM, patent SVG-D, patent LIMA-LAD.  2. Permanent atrial fibrillation: Not anticoagulated due to GI bleeding.  Holter monitor (4/15) with mean HR 115 (afib).   He had AV nodal ablation with St Jude dual chamber PPM  3. Chronic diastolic CHF: Echo (9/56) with EF 55-60%, bioprosthetic aortic valve with mean gradient 22 mmHg, mildly decreased RV systolic function. RHC (12/2013): RA 14, RV 44/4/11, PA 50/23 (33), PCWP 20, v = 35, Fick CO/CI: 6.0/2.6, PVR 2.2 WU, PA 59%.  Echo (  8/15) with EF 55-60%, mild LVH, bioprosthetic aortic valve with mean gradient 16 mmHg, PA systolic pressure 46 mmHg, mild to moderate Matthew.  Echo (8/16) with EF 55-60%, normal bioprosthetic aortic valve, mild Matthew, PASP 44 mmHg.  - Gynecomastia with spironolactone.  - Echo (1/18): EF 60-65%, grade II diastolic dysfunction, bioprosthetic aortic valve appeared normal, PA systolic pressure 59 mmHg.  4. Severe aortic stenosis s/p bioprosthetic AVR in 10/13. Mean gradient 22 mmHg across valve on echo in 8/14.  Mean gradient 16  mmHg on echo in 8/15. Mean gradient 15 mmHg by echo in 1/18.  5. NASH with cirrhosis: ascites, thrombocytopenia.  CT abdomen in 6/17 showed cirrhosis and moderate ascites. He has regular paracenteses.  6. 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  EGD in 1/15 showed mild portal gastropathy and 2 small antral ulcers. Recurrent GI bleed in 6/15, EGD showed gastritis and ?GAVE.  - EGD (4/18): grade I varices, gastric polyps, GAVE.   - Colonoscopy (4/18): No bleeding source.  - GI bleeding 1/19: EGD showed grade I varices, portal hypertensive gastropathy, and gastric polyps.  Capsule endoscopy showed nonbleeding AVMs.  - GI bleeding 2/19: Nuclear medicine bleeding study was negative.  7. Hyperlipidemia 8. HTN 9. GERD 10. COPD 59. OSA: Cannot tolerate CPAP.  12. 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  14. Carotid dopplers (11/15) with minimal stenosis.  15. ABIs 12/11/2014: normal   16. Depression  SH: Lives with wife in [place], prior smoker.   FH: CAD  ROS: All systems reviewed and negative except as per HPI.   Current Outpatient Medications on File Prior to Encounter  Medication Sig Dispense Refill  . acetaminophen (TYLENOL) 500 MG tablet Take 1,000 mg by mouth at bedtime as needed for mild pain or headache.     . allopurinol (ZYLOPRIM) 100 MG tablet Take 1 tablet (100 mg total) by mouth daily. 90 tablet 3  . atorvastatin (LIPITOR) 20 MG tablet Take 20 mg by mouth daily at 6 PM.     . colchicine 0.6 MG tablet Take 0.6 mg by mouth daily.     [name] gabapentin (NEURONTIN) 300 MG capsule Take 300 mg by mouth 3 (three) times daily.  6  . insulin NPH Human (HUMULIN N,NOVOLIN N) 100 UNIT/ML injection Inject 65 Units into the skin 2 (two) times daily before a meal.     . insulin regular (NOVOLIN R,HUMULIN R) 100 units/mL injection Inject 30 Units into the skin 2 (two) times daily before a meal.     . metoprolol succinate (TOPROL-XL) 25 MG 24 hr tablet Take 1 tablet (25 mg total) by mouth 2 (two) times daily.  60 tablet 1  . Multiple Vitamins-Minerals ([employer] SPECTRAVITE ADULT 50+ PO) Take 1 tablet by mouth daily.    . pantoprazole (PROTONIX) 40 MG tablet Take 1 tablet (40 mg total) by mouth 2 (two) times daily before a meal. 60 tablet 0  . potassium chloride SA (K-DUR,KLOR-CON) 20 MEQ tablet Take 2 tablets (40 mEq total) by mouth 2 (two) times daily. 120 tablet 3  . PROAIR HFA 108 (90 Base) MCG/ACT inhaler Take 2 puffs by mouth 2 (two) times daily.    . sertraline (ZOLOFT) 50 MG tablet Take 1 tablet (50 mg total) by mouth daily. 90 tablet 3  . spironolactone (ALDACTONE) 50 MG tablet Take 1 tablet (50 mg total) by mouth 2 (two) times daily. 180 tablet 3  . tamsulosin (FLOMAX) 0.4 MG CAPS capsule TAKE ONE CAPSULE BY MOUTH EVERY DAY AFTER SUPPER 90  capsule 3  . torsemide (DEMADEX) 20 MG tablet Take 6 tablets (120 mg total) by mouth 2 (two) times daily. 360 tablet 3  . vitamin C (ASCORBIC ACID) 500 MG tablet Take 500 mg by mouth 2 (two) times daily.     No current facility-administered medications on file prior to encounter.     Vitals:   09/14/17 0906  BP: (!) 110/55  Pulse: 76  SpO2: 99%  Weight: 275 lb 6.4 oz (124.9 kg)     Wt Readings from Last 3 Encounters:  09/14/17 275 lb 6.4 oz (124.9 kg)  09/07/17 272 lb 7.8 oz (123.6 kg)  08/26/17 263 lb 14.3 oz (119.7 kg)    General: NAD Neck: JVP 12 cm, no thyromegaly or thyroid nodule.  Lungs: Clear to auscultation bilaterally with normal respiratory effort. CV: Nondisplaced PMI.  Heart regular S1/S2, no S3/S4, 2/6 SEM RUSB.  1+ ankle edema.  No carotid bruit.  Normal pedal pulses.  Abdomen: Soft, nontender, no hepatosplenomegaly.  The abdomen was moderately distended.  Skin: Intact without lesions or rashes.  Neurologic: Alert and oriented x 3.  Psych: Normal affect. Extremities: No clubbing or cyanosis.  HEENT: Normal.   Assessment/Plan: 1. Chronic diastolic CHF:  EF 46-65% (1/18).  NYHA class IIIb symptoms.  He has had trouble with ascites  due to cardiac cirrhosis/NASH.  He is volume overloaded on exam with recurrent cirrhosis.  He still drinks a huge amount of fluid and has a high sodium diet.   - We extensively discussed cutting back on fluid intake and sodium intake.   His dietary habits are definitely potentiating his CHF.  - I will set him up for a therapeutic paracentesis.  - Continue torsemide 120 mg bid.    - Increase metolazone to 5 mg every other day.    - Continue spironolactone 50 mg bid.   - BMET today and again in 10 days.   - Repeat echo.  2. Chronic atrial fibrillation:  CHADSVASC 4 - age > 77, CHF, HTN, DM2. He is not anticoagulated (was on coumadin in past) due to history of GI bleeding from AVMs, most recently in 2/19. s/p AV node ablation with dual chamber pacing given very difficult rate control (St Jude).  He had melena yesterday. - Intolerant of ASA and coumadin even for short period, so no Watchman pursued. - He is going to get a CBC today at [facility] to be followed up by his GI MD.  3. CKD stage III: BMET 10 days after increasing metolazone.  He follows with Dr. Joelyn Oms.  4. Bioprosthetic AVR: Will get repeat echo.    5. CAD: s/p CABG.  No chest pain.   - Continue atorvastatin 20 mg daily and Toprol XL 25 mg BID.  - Off ASA and anticoag with history of GI bleed.  6. Cirrhosis: May be a combination of cardiac cirrhosis and NASH-related. Follows with Dr. Laural Golden.  - Significant ascites on exam, will schedule paracentesis.  7. Depression: Continue sertraline.  8. OSA: Use CPAP.   Followup in 3 weeks with NP/PA.   [name] 09/14/2017

## 2017-09-14 NOTE — Procedures (Signed)
Ultrasound-guided therapeutic paracentesis performed yielding 3.4  liters of serous colored fluid. No immediate complications.  [name] 2:02 PM 09/14/2017

## 2017-09-14 NOTE — Patient Instructions (Addendum)
Increase Metolazone 5 mg (1 tab), every other day  Have Paracentesis today at [place] physician recommends that you return for lab work in: 10 days  Your physician recommends that you schedule a follow-up appointment in: 3 weeks with Amy

## 2017-09-14 NOTE — Telephone Encounter (Signed)
Patient 's wife called and given the results. Per Dr.Rehman the patient will need to have T&C for 2 units of PRBC. Order was sent to the [facility] - short stay.  They will call the patient's daughter with appointment.

## 2017-09-15 ENCOUNTER — Encounter (HOSPITAL_COMMUNITY)
Admission: RE | Admit: 2017-09-15 | Discharge: 2017-09-15 | Disposition: A | Discharge status: Home / Self Care | Payer: Medicare Other | Source: Ambulatory Visit | Attending: Internal Medicine | Admitting: Internal Medicine

## 2017-09-15 DIAGNOSIS — D649 Anemia, unspecified: Secondary | ICD-10-CM | POA: Insufficient documentation

## 2017-09-15 MED ORDER — ACETAMINOPHEN 325 MG PO TABS: 650.0000 mg | ORAL_TABLET | Freq: Once | ORAL | Status: DC

## 2017-09-15 MED ORDER — DIPHENHYDRAMINE HCL 25 MG PO CAPS: 25.0000 mg | ORAL_CAPSULE | Freq: Once | ORAL | Status: DC

## 2017-09-15 MED ORDER — SODIUM CHLORIDE 0.9 % IV SOLN: Freq: Once | INTRAVENOUS | Status: DC

## 2017-09-18 ENCOUNTER — Encounter (HOSPITAL_COMMUNITY)
Admission: RE | Admit: 2017-09-18 | Discharge: 2017-09-18 | Disposition: A | Discharge status: Home / Self Care | Payer: Medicare Other | Source: Ambulatory Visit | Attending: Internal Medicine | Admitting: Internal Medicine

## 2017-09-18 DIAGNOSIS — D649 Anemia, unspecified: Secondary | ICD-10-CM | POA: Diagnosis not present

## 2017-09-18 LAB — PREPARE RBC (CROSSMATCH)

## 2017-09-18 LAB — CBC
HEMATOCRIT: 26.2 % — AB (ref 39.0–52.0)
Hemoglobin: 7.8 g/dL — ABNORMAL LOW (ref 13.0–17.0)
MCH: 25.1 pg — ABNORMAL LOW (ref 26.0–34.0)
MCHC: 29.8 g/dL — AB (ref 30.0–36.0)
MCV: 84.2 fL (ref 78.0–100.0)
PLATELETS: 187 10*3/uL (ref 150–400)
RBC: 3.11 MIL/uL — ABNORMAL LOW (ref 4.22–5.81)
RDW: 17.5 % — AB (ref 11.5–15.5)
WBC: 8.4 10*3/uL (ref 4.0–10.5)

## 2017-09-18 LAB — POCT HEMOGLOBIN-HEMACUE: HEMOGLOBIN: 7.9 g/dL — AB (ref 13.0–17.0)

## 2017-09-18 NOTE — Progress Notes (Signed)
Here for blood draw for transfusion in the AM. Consent signed. Blood drawn and sent to lab for results. Tolerated well.

## 2017-09-18 NOTE — Progress Notes (Addendum)
Results for HEBER, HOOG (MRN [id number]) as of 09/18/2017 10:51  Ref. Range 09/18/2017 10:05  WBC Latest Ref Range: 4.0 - 10.5 K/uL 8.4  RBC Latest Ref Range: 4.22 - 5.81 MIL/uL 3.11 (L)  Hemoglobin Latest Ref Range: 13.0 - 17.0 g/dL 7.8 (L)  HCT Latest Ref Range: 39.0 - 52.0 % 26.2 (L)  MCV Latest Ref Range: 78.0 - 100.0 fL 84.2  MCH Latest Ref Range: 26.0 - 34.0 pg 25.1 (L)  MCHC Latest Ref Range: 30.0 - 36.0 g/dL 29.8 (L)  RDW Latest Ref Range: 11.5 - 15.5 % 17.5 (H)  Platelets Latest Ref Range: 150 - 400 K/uL 187  Pre transfusion. Will be transfused 09/19/17.

## 2017-09-19 ENCOUNTER — Encounter (HOSPITAL_COMMUNITY)
Admission: RE | Admit: 2017-09-19 | Discharge: 2017-09-19 | Disposition: A | Discharge status: Home / Self Care | Payer: Medicare Other | Source: Ambulatory Visit | Attending: Internal Medicine | Admitting: Internal Medicine

## 2017-09-19 ENCOUNTER — Encounter (HOSPITAL_COMMUNITY): Payer: Self-pay

## 2017-09-19 DIAGNOSIS — D649 Anemia, unspecified: Secondary | ICD-10-CM | POA: Diagnosis not present

## 2017-09-19 MED ORDER — SODIUM CHLORIDE 0.9 % IV SOLN
Freq: Once | INTRAVENOUS | Status: AC
  Administered 2017-09-19: 08:00:00 via INTRAVENOUS

## 2017-09-19 MED ORDER — FUROSEMIDE 10 MG/ML IJ SOLN
40.0000 mg | Freq: Once | INTRAMUSCULAR | Status: AC
Start: 2017-09-19 — End: 2017-09-19
  Administered 2017-09-19: 40 mg via INTRAVENOUS
  Filled 2017-09-19: qty 4

## 2017-09-19 MED ORDER — ACETAMINOPHEN 325 MG PO TABS
650.0000 mg | ORAL_TABLET | Freq: Once | ORAL | Status: AC
  Administered 2017-09-19: 650 mg via ORAL

## 2017-09-19 MED ORDER — ACETAMINOPHEN 325 MG PO TABS
ORAL_TABLET | ORAL | Status: AC
  Filled 2017-09-19: qty 2

## 2017-09-19 MED ORDER — DIPHENHYDRAMINE HCL 25 MG PO CAPS
ORAL_CAPSULE | ORAL | Status: AC
  Filled 2017-09-19: qty 1

## 2017-09-19 MED ORDER — DIPHENHYDRAMINE HCL 25 MG PO CAPS
25.0000 mg | ORAL_CAPSULE | Freq: Once | ORAL | Status: AC
  Administered 2017-09-19: 25 mg via ORAL

## 2017-09-20 ENCOUNTER — Encounter (HOSPITAL_COMMUNITY)
Admission: RE | Admit: 2017-09-20 | Discharge: 2017-09-20 | Disposition: A | Discharge status: Home / Self Care | Payer: Medicare Other | Source: Ambulatory Visit | Attending: Internal Medicine | Admitting: Internal Medicine

## 2017-09-20 DIAGNOSIS — D649 Anemia, unspecified: Secondary | ICD-10-CM | POA: Diagnosis not present

## 2017-09-20 LAB — PREPARE RBC (CROSSMATCH)

## 2017-09-20 LAB — HEMOGLOBIN AND HEMATOCRIT, BLOOD
HCT: 28.6 % — ABNORMAL LOW (ref 39.0–52.0)
Hemoglobin: 8.6 g/dL — ABNORMAL LOW (ref 13.0–17.0)

## 2017-09-20 LAB — OCCULT BLOOD X 1 CARD TO LAB, STOOL: FECAL OCCULT BLD: POSITIVE — AB

## 2017-09-20 MED ORDER — ACETAMINOPHEN 325 MG PO TABS
650.0000 mg | ORAL_TABLET | Freq: Once | ORAL | Status: AC
  Administered 2017-09-20: 650 mg via ORAL

## 2017-09-20 MED ORDER — DIPHENHYDRAMINE HCL 25 MG PO CAPS
ORAL_CAPSULE | ORAL | Status: AC
  Filled 2017-09-20: qty 1

## 2017-09-20 MED ORDER — DIPHENHYDRAMINE HCL 25 MG PO CAPS
25.0000 mg | ORAL_CAPSULE | Freq: Once | ORAL | Status: AC
  Administered 2017-09-20: 25 mg via ORAL

## 2017-09-20 MED ORDER — ACETAMINOPHEN 325 MG PO TABS
ORAL_TABLET | ORAL | Status: AC
  Filled 2017-09-20: qty 2

## 2017-09-20 MED ORDER — SODIUM CHLORIDE 0.9 % IV SOLN
Freq: Once | INTRAVENOUS | Status: AC
  Administered 2017-09-20: 09:00:00 via INTRAVENOUS

## 2017-09-20 NOTE — Progress Notes (Signed)
Late entry for 09/19/17 Pt arrived to [facility] for transfusion of 2 units of PRBC. The pt had antibodies so the blood had been sent from [facility]. The first unit was transfused without incident but the second unit would not scan into EPICs blood flowsheet. The error message that came up stated " the unit does not match blood bank information for this patient". After multiple attempts, the blood was returned to [name] in the lab. A work order was put into [employer] and our Production designer, theatre/television/film, [name] was notified to advise further on this matter. A new order was put in and released and after taking care of another patient, I went back to pick up the blood to give the blood to the pt but it would not scan and continued to alert me not to give the blood. The blood was taken back to the lab where the temperature was found to be 16 degrees Celsius which is over the proper temperature and was discarded. Dr. Laural [name] was notified and pt was scheduled to return to [facility] clinic for the remaining unit of blood, which he received today.

## 2017-09-20 NOTE — Progress Notes (Signed)
Results for TOR, TSUDA (MRN [id number]) as of 09/20/2017 13:16  Ref. Range 09/20/2017 11:56  Hemoglobin Latest Ref Range: 13.0 - 17.0 g/dL 8.6 (L)  HCT Latest Ref Range: 39.0 - 52.0 % 28.6 (L)   After 2 units PRBC. Hemoccult (stool) sent to lab. Results pending.

## 2017-09-21 DIAGNOSIS — D500 Iron deficiency anemia secondary to blood loss (chronic): Secondary | ICD-10-CM | POA: Diagnosis not present

## 2017-09-21 DIAGNOSIS — K922 Gastrointestinal hemorrhage, unspecified: Secondary | ICD-10-CM | POA: Diagnosis not present

## 2017-09-21 LAB — TYPE AND SCREEN
ABO/RH(D): A POS
Antibody Screen: POSITIVE
DONOR AG TYPE: NEGATIVE
DONOR AG TYPE: NEGATIVE
Donor AG Type: NEGATIVE
UNIT DIVISION: 0
UNIT DIVISION: 0
UNIT DIVISION: 0

## 2017-09-21 LAB — BPAM RBC
BLOOD PRODUCT EXPIRATION DATE: 201903192359
Blood Product Expiration Date: 201903092359
Blood Product Expiration Date: 201903192359
ISSUE DATE / TIME: 201902260728
ISSUE DATE / TIME: 201902261009
ISSUE DATE / TIME: 201902270911
UNIT TYPE AND RH: 6200
UNIT TYPE AND RH: 6200
Unit Type and Rh: 6200

## 2017-09-22 ENCOUNTER — Other Ambulatory Visit (INDEPENDENT_AMBULATORY_CARE_PROVIDER_SITE_OTHER): Payer: Self-pay | Admitting: *Deleted

## 2017-09-22 DIAGNOSIS — D649 Anemia, unspecified: Secondary | ICD-10-CM

## 2017-09-26 ENCOUNTER — Ambulatory Visit (HOSPITAL_COMMUNITY)
Admission: RE | Admit: 2017-09-26 | Discharge: 2017-09-26 | Disposition: A | Discharge status: Home / Self Care | Payer: Medicare Other | Source: Ambulatory Visit | Attending: Cardiology | Admitting: Cardiology

## 2017-09-26 ENCOUNTER — Other Ambulatory Visit: Payer: Self-pay

## 2017-09-26 ENCOUNTER — Telehealth (INDEPENDENT_AMBULATORY_CARE_PROVIDER_SITE_OTHER): Payer: Self-pay | Admitting: *Deleted

## 2017-09-26 ENCOUNTER — Encounter (HOSPITAL_COMMUNITY): Payer: Self-pay | Admitting: Emergency Medicine

## 2017-09-26 ENCOUNTER — Emergency Department (HOSPITAL_COMMUNITY): Payer: Medicare Other

## 2017-09-26 ENCOUNTER — Inpatient Hospital Stay (HOSPITAL_COMMUNITY)
Admission: EM | Admit: 2017-09-26 | Discharge: 2017-09-30 | DRG: 378 | Disposition: A | Discharge status: Home / Self Care | Payer: Medicare Other | Attending: Internal Medicine | Admitting: Internal Medicine

## 2017-09-26 DIAGNOSIS — R0600 Dyspnea, unspecified: Secondary | ICD-10-CM | POA: Diagnosis not present

## 2017-09-26 DIAGNOSIS — Z8249 Family history of ischemic heart disease and other diseases of the circulatory system: Secondary | ICD-10-CM

## 2017-09-26 DIAGNOSIS — I5033 Acute on chronic diastolic (congestive) heart failure: Secondary | ICD-10-CM

## 2017-09-26 DIAGNOSIS — Z9842 Cataract extraction status, left eye: Secondary | ICD-10-CM

## 2017-09-26 DIAGNOSIS — K5521 Angiodysplasia of colon with hemorrhage: Secondary | ICD-10-CM | POA: Diagnosis not present

## 2017-09-26 DIAGNOSIS — Z888 Allergy status to other drugs, medicaments and biological substances status: Secondary | ICD-10-CM | POA: Diagnosis not present

## 2017-09-26 DIAGNOSIS — Z951 Presence of aortocoronary bypass graft: Secondary | ICD-10-CM

## 2017-09-26 DIAGNOSIS — K7460 Unspecified cirrhosis of liver: Secondary | ICD-10-CM

## 2017-09-26 DIAGNOSIS — I509 Heart failure, unspecified: Secondary | ICD-10-CM | POA: Diagnosis not present

## 2017-09-26 DIAGNOSIS — K317 Polyp of stomach and duodenum: Secondary | ICD-10-CM | POA: Diagnosis present

## 2017-09-26 DIAGNOSIS — E785 Hyperlipidemia, unspecified: Secondary | ICD-10-CM | POA: Diagnosis present

## 2017-09-26 DIAGNOSIS — M109 Gout, unspecified: Secondary | ICD-10-CM | POA: Diagnosis present

## 2017-09-26 DIAGNOSIS — I214 Non-ST elevation (NSTEMI) myocardial infarction: Secondary | ICD-10-CM

## 2017-09-26 DIAGNOSIS — E1151 Type 2 diabetes mellitus with diabetic peripheral angiopathy without gangrene: Secondary | ICD-10-CM

## 2017-09-26 DIAGNOSIS — Y92230 Patient room in hospital as the place of occurrence of the external cause: Secondary | ICD-10-CM | POA: Diagnosis not present

## 2017-09-26 DIAGNOSIS — D649 Anemia, unspecified: Secondary | ICD-10-CM

## 2017-09-26 DIAGNOSIS — E861 Hypovolemia: Secondary | ICD-10-CM | POA: Diagnosis present

## 2017-09-26 DIAGNOSIS — I342 Nonrheumatic mitral (valve) stenosis: Secondary | ICD-10-CM | POA: Diagnosis not present

## 2017-09-26 DIAGNOSIS — Z833 Family history of diabetes mellitus: Secondary | ICD-10-CM

## 2017-09-26 DIAGNOSIS — K219 Gastro-esophageal reflux disease without esophagitis: Secondary | ICD-10-CM | POA: Diagnosis present

## 2017-09-26 DIAGNOSIS — J449 Chronic obstructive pulmonary disease, unspecified: Secondary | ICD-10-CM | POA: Diagnosis present

## 2017-09-26 DIAGNOSIS — I361 Nonrheumatic tricuspid (valve) insufficiency: Secondary | ICD-10-CM | POA: Diagnosis not present

## 2017-09-26 DIAGNOSIS — I5032 Chronic diastolic (congestive) heart failure: Secondary | ICD-10-CM

## 2017-09-26 DIAGNOSIS — G4700 Insomnia, unspecified: Secondary | ICD-10-CM | POA: Diagnosis present

## 2017-09-26 DIAGNOSIS — I442 Atrioventricular block, complete: Secondary | ICD-10-CM | POA: Diagnosis present

## 2017-09-26 DIAGNOSIS — R188 Other ascites: Secondary | ICD-10-CM | POA: Diagnosis present

## 2017-09-26 DIAGNOSIS — Z950 Presence of cardiac pacemaker: Secondary | ICD-10-CM

## 2017-09-26 DIAGNOSIS — Z794 Long term (current) use of insulin: Secondary | ICD-10-CM | POA: Diagnosis not present

## 2017-09-26 DIAGNOSIS — Z803 Family history of malignant neoplasm of breast: Secondary | ICD-10-CM

## 2017-09-26 DIAGNOSIS — E871 Hypo-osmolality and hyponatremia: Secondary | ICD-10-CM | POA: Diagnosis not present

## 2017-09-26 DIAGNOSIS — I130 Hypertensive heart and chronic kidney disease with heart failure and stage 1 through stage 4 chronic kidney disease, or unspecified chronic kidney disease: Secondary | ICD-10-CM | POA: Diagnosis present

## 2017-09-26 DIAGNOSIS — E1122 Type 2 diabetes mellitus with diabetic chronic kidney disease: Secondary | ICD-10-CM | POA: Diagnosis present

## 2017-09-26 DIAGNOSIS — I4821 Permanent atrial fibrillation: Secondary | ICD-10-CM | POA: Diagnosis present

## 2017-09-26 DIAGNOSIS — G4733 Obstructive sleep apnea (adult) (pediatric): Secondary | ICD-10-CM | POA: Diagnosis present

## 2017-09-26 DIAGNOSIS — Z825 Family history of asthma and other chronic lower respiratory diseases: Secondary | ICD-10-CM

## 2017-09-26 DIAGNOSIS — D509 Iron deficiency anemia, unspecified: Secondary | ICD-10-CM | POA: Diagnosis present

## 2017-09-26 DIAGNOSIS — I482 Chronic atrial fibrillation: Secondary | ICD-10-CM | POA: Diagnosis not present

## 2017-09-26 DIAGNOSIS — R778 Other specified abnormalities of plasma proteins: Secondary | ICD-10-CM | POA: Diagnosis present

## 2017-09-26 DIAGNOSIS — K766 Portal hypertension: Secondary | ICD-10-CM | POA: Diagnosis present

## 2017-09-26 DIAGNOSIS — Z886 Allergy status to analgesic agent status: Secondary | ICD-10-CM

## 2017-09-26 DIAGNOSIS — I248 Other forms of acute ischemic heart disease: Secondary | ICD-10-CM | POA: Diagnosis present

## 2017-09-26 DIAGNOSIS — N184 Chronic kidney disease, stage 4 (severe): Secondary | ICD-10-CM | POA: Diagnosis not present

## 2017-09-26 DIAGNOSIS — I70208 Unspecified atherosclerosis of native arteries of extremities, other extremity: Secondary | ICD-10-CM | POA: Diagnosis present

## 2017-09-26 DIAGNOSIS — Z885 Allergy status to narcotic agent status: Secondary | ICD-10-CM

## 2017-09-26 DIAGNOSIS — Z87891 Personal history of nicotine dependence: Secondary | ICD-10-CM

## 2017-09-26 DIAGNOSIS — K922 Gastrointestinal hemorrhage, unspecified: Secondary | ICD-10-CM

## 2017-09-26 DIAGNOSIS — I5022 Chronic systolic (congestive) heart failure: Secondary | ICD-10-CM

## 2017-09-26 DIAGNOSIS — I10 Essential (primary) hypertension: Secondary | ICD-10-CM | POA: Diagnosis not present

## 2017-09-26 DIAGNOSIS — I25810 Atherosclerosis of coronary artery bypass graft(s) without angina pectoris: Secondary | ICD-10-CM | POA: Diagnosis present

## 2017-09-26 DIAGNOSIS — Z955 Presence of coronary angioplasty implant and graft: Secondary | ICD-10-CM

## 2017-09-26 DIAGNOSIS — Z952 Presence of prosthetic heart valve: Secondary | ICD-10-CM

## 2017-09-26 DIAGNOSIS — R748 Abnormal levels of other serum enzymes: Secondary | ICD-10-CM

## 2017-09-26 DIAGNOSIS — I350 Nonrheumatic aortic (valve) stenosis: Secondary | ICD-10-CM

## 2017-09-26 DIAGNOSIS — K9289 Other specified diseases of the digestive system: Secondary | ICD-10-CM | POA: Diagnosis not present

## 2017-09-26 DIAGNOSIS — R7989 Other specified abnormal findings of blood chemistry: Secondary | ICD-10-CM

## 2017-09-26 DIAGNOSIS — I2510 Atherosclerotic heart disease of native coronary artery without angina pectoris: Secondary | ICD-10-CM | POA: Diagnosis present

## 2017-09-26 DIAGNOSIS — M1990 Unspecified osteoarthritis, unspecified site: Secondary | ICD-10-CM | POA: Diagnosis present

## 2017-09-26 DIAGNOSIS — R031 Nonspecific low blood-pressure reading: Secondary | ICD-10-CM | POA: Diagnosis not present

## 2017-09-26 DIAGNOSIS — I340 Nonrheumatic mitral (valve) insufficiency: Secondary | ICD-10-CM | POA: Diagnosis not present

## 2017-09-26 DIAGNOSIS — Z961 Presence of intraocular lens: Secondary | ICD-10-CM | POA: Diagnosis present

## 2017-09-26 DIAGNOSIS — D62 Acute posthemorrhagic anemia: Secondary | ICD-10-CM | POA: Diagnosis present

## 2017-09-26 DIAGNOSIS — Z9841 Cataract extraction status, right eye: Secondary | ICD-10-CM

## 2017-09-26 DIAGNOSIS — T502X5A Adverse effect of carbonic-anhydrase inhibitors, benzothiadiazides and other diuretics, initial encounter: Secondary | ICD-10-CM | POA: Diagnosis not present

## 2017-09-26 LAB — GLUCOSE, CAPILLARY
GLUCOSE-CAPILLARY: 236 mg/dL — AB (ref 65–99)
Glucose-Capillary: 208 mg/dL — ABNORMAL HIGH (ref 65–99)

## 2017-09-26 LAB — CBC
HCT: 23.9 % — ABNORMAL LOW (ref 39.0–52.0)
HCT: 24 % — ABNORMAL LOW (ref 39.0–52.0)
Hemoglobin: 7.3 g/dL — ABNORMAL LOW (ref 13.0–17.0)
Hemoglobin: 7.3 g/dL — ABNORMAL LOW (ref 13.0–17.0)
MCH: 24.7 pg — AB (ref 26.0–34.0)
MCH: 24.7 pg — ABNORMAL LOW (ref 26.0–34.0)
MCHC: 30.5 g/dL (ref 30.0–36.0)
MCHC: 30.4 g/dL (ref 30.0–36.0)
MCV: 81 fL (ref 78.0–100.0)
MCV: 81.1 fL (ref 78.0–100.0)
PLATELETS: 161 10*3/uL (ref 150–400)
PLATELETS: 165 10*3/uL (ref 150–400)
RBC: 2.95 MIL/uL — ABNORMAL LOW (ref 4.22–5.81)
RBC: 2.96 MIL/uL — AB (ref 4.22–5.81)
RDW: 17.7 % — AB (ref 11.5–15.5)
RDW: 17.8 % — ABNORMAL HIGH (ref 11.5–15.5)
WBC: 8.7 10*3/uL (ref 4.0–10.5)
WBC: 8.9 10*3/uL (ref 4.0–10.5)

## 2017-09-26 LAB — COMPREHENSIVE METABOLIC PANEL
ALT: 18 U/L (ref 17–63)
AST: 25 U/L (ref 15–41)
Albumin: 3.5 g/dL (ref 3.5–5.0)
Alkaline Phosphatase: 79 U/L (ref 38–126)
Anion gap: 14 (ref 5–15)
BUN: 112 mg/dL — ABNORMAL HIGH (ref 6–20)
CHLORIDE: 89 mmol/L — AB (ref 101–111)
CO2: 21 mmol/L — ABNORMAL LOW (ref 22–32)
CREATININE: 2.65 mg/dL — AB (ref 0.61–1.24)
Calcium: 9 mg/dL (ref 8.9–10.3)
GFR calc Af Amer: 27 mL/min — ABNORMAL LOW (ref >60)
GFR, EST NON AFRICAN AMERICAN: 23 mL/min — AB (ref >60)
Glucose, Bld: 272 mg/dL — ABNORMAL HIGH (ref 65–99)
POTASSIUM: 4.2 mmol/L (ref 3.5–5.1)
Sodium: 124 mmol/L — ABNORMAL LOW (ref 135–145)
Total Bilirubin: 0.6 mg/dL (ref 0.3–1.2)
Total Protein: 7.5 g/dL (ref 6.5–8.1)

## 2017-09-26 LAB — BASIC METABOLIC PANEL
Anion gap: 13 (ref 5–15)
BUN: 112 mg/dL — ABNORMAL HIGH (ref 6–20)
CALCIUM: 8.9 mg/dL (ref 8.9–10.3)
CO2: 21 mmol/L — ABNORMAL LOW (ref 22–32)
CREATININE: 2.69 mg/dL — AB (ref 0.61–1.24)
Chloride: 93 mmol/L — ABNORMAL LOW (ref 101–111)
GFR calc Af Amer: 26 mL/min — ABNORMAL LOW (ref >60)
GFR, EST NON AFRICAN AMERICAN: 23 mL/min — AB (ref >60)
GLUCOSE: 237 mg/dL — AB (ref 65–99)
Potassium: 4.6 mmol/L (ref 3.5–5.1)
SODIUM: 127 mmol/L — AB (ref 135–145)

## 2017-09-26 LAB — TROPONIN I
Troponin I: 1.7 ng/mL (ref <0.03)
Troponin I: 1.37 ng/mL (ref <0.03)

## 2017-09-26 MED ORDER — SERTRALINE HCL 50 MG PO TABS
50.0000 mg | ORAL_TABLET | Freq: Every day | ORAL | Status: DC
  Administered 2017-09-27 – 2017-09-30 (×4): 50 mg via ORAL
  Filled 2017-09-26 (×4): qty 1

## 2017-09-26 MED ORDER — SODIUM CHLORIDE 0.9 % IV SOLN
10.0000 mL/h | Freq: Once | INTRAVENOUS | Status: AC
  Administered 2017-09-26: 10 mL/h via INTRAVENOUS

## 2017-09-26 MED ORDER — INSULIN ASPART 100 UNIT/ML ~~LOC~~ SOLN
0.0000 [IU] | SUBCUTANEOUS | Status: DC
  Administered 2017-09-26 (×2): 5 [IU] via SUBCUTANEOUS
  Administered 2017-09-27: 3 [IU] via SUBCUTANEOUS
  Administered 2017-09-27 (×3): 2 [IU] via SUBCUTANEOUS
  Administered 2017-09-27 – 2017-09-28 (×3): 8 [IU] via SUBCUTANEOUS
  Administered 2017-09-28: 3 [IU] via SUBCUTANEOUS
  Administered 2017-09-28: 11 [IU] via SUBCUTANEOUS
  Administered 2017-09-28: 5 [IU] via SUBCUTANEOUS
  Administered 2017-09-28: 3 [IU] via SUBCUTANEOUS
  Administered 2017-09-29: 2 [IU] via SUBCUTANEOUS
  Administered 2017-09-29 (×2): 5 [IU] via SUBCUTANEOUS
  Administered 2017-09-29: 11 [IU] via SUBCUTANEOUS
  Administered 2017-09-29: 5 [IU] via SUBCUTANEOUS
  Administered 2017-09-30: 11 [IU] via SUBCUTANEOUS
  Administered 2017-09-30: 15 [IU] via SUBCUTANEOUS
  Administered 2017-09-30: 11 [IU] via SUBCUTANEOUS

## 2017-09-26 MED ORDER — GABAPENTIN 300 MG PO CAPS
300.0000 mg | ORAL_CAPSULE | Freq: Three times a day (TID) | ORAL | Status: DC
  Administered 2017-09-26 – 2017-09-30 (×13): 300 mg via ORAL
  Filled 2017-09-26 (×13): qty 1

## 2017-09-26 MED ORDER — SODIUM CHLORIDE 0.9 % IV SOLN
Freq: Once | INTRAVENOUS | Status: AC
  Administered 2017-09-26: 18:00:00 via INTRAVENOUS

## 2017-09-26 MED ORDER — TAMSULOSIN HCL 0.4 MG PO CAPS
0.4000 mg | ORAL_CAPSULE | Freq: Every day | ORAL | Status: DC
  Administered 2017-09-26 – 2017-09-29 (×4): 0.4 mg via ORAL
  Filled 2017-09-26 (×4): qty 1

## 2017-09-26 MED ORDER — METOPROLOL SUCCINATE ER 25 MG PO TB24
25.0000 mg | ORAL_TABLET | Freq: Two times a day (BID) | ORAL | Status: DC
Start: 2017-09-26 — End: 2017-09-30
  Administered 2017-09-26 – 2017-09-29 (×7): 25 mg via ORAL
  Filled 2017-09-26 (×7): qty 1

## 2017-09-26 MED ORDER — ALLOPURINOL 100 MG PO TABS
100.0000 mg | ORAL_TABLET | Freq: Every day | ORAL | Status: DC
  Administered 2017-09-27 – 2017-09-30 (×4): 100 mg via ORAL
  Filled 2017-09-26 (×4): qty 1

## 2017-09-26 MED ORDER — SODIUM CHLORIDE 0.9% FLUSH
3.0000 mL | Freq: Two times a day (BID) | INTRAVENOUS | Status: DC
  Administered 2017-09-26 – 2017-09-30 (×7): 3 mL via INTRAVENOUS

## 2017-09-26 MED ORDER — ALBUTEROL SULFATE (2.5 MG/3ML) 0.083% IN NEBU: 3.0000 mL | INHALATION_SOLUTION | RESPIRATORY_TRACT | Status: DC | PRN

## 2017-09-26 MED ORDER — PANTOPRAZOLE SODIUM 40 MG IV SOLR
40.0000 mg | Freq: Two times a day (BID) | INTRAVENOUS | Status: DC
  Administered 2017-09-26 – 2017-09-30 (×8): 40 mg via INTRAVENOUS
  Filled 2017-09-26 (×8): qty 40

## 2017-09-26 MED ORDER — ACETAMINOPHEN 325 MG PO TABS
650.0000 mg | ORAL_TABLET | ORAL | Status: DC | PRN
  Administered 2017-09-27 – 2017-09-28 (×3): 650 mg via ORAL
  Filled 2017-09-26 (×3): qty 2

## 2017-09-26 MED ORDER — FUROSEMIDE 10 MG/ML IJ SOLN
100.0000 mg | Freq: Two times a day (BID) | INTRAVENOUS | Status: DC
  Administered 2017-09-26 – 2017-09-27 (×2): 100 mg via INTRAVENOUS
  Filled 2017-09-26 (×4): qty 10

## 2017-09-26 MED ORDER — SODIUM CHLORIDE 0.9 % IV SOLN: 250.0000 mL | INTRAVENOUS | Status: DC | PRN

## 2017-09-26 MED ORDER — SODIUM CHLORIDE 0.9% FLUSH
3.0000 mL | INTRAVENOUS | Status: DC | PRN
  Administered 2017-09-29: 3 mL via INTRAVENOUS
  Filled 2017-09-26: qty 3

## 2017-09-26 MED ORDER — ATORVASTATIN CALCIUM 20 MG PO TABS
20.0000 mg | ORAL_TABLET | Freq: Every day | ORAL | Status: DC
  Administered 2017-09-26 – 2017-09-29 (×4): 20 mg via ORAL
  Filled 2017-09-26 (×4): qty 1

## 2017-09-26 MED ORDER — ALPRAZOLAM 0.25 MG PO TABS
0.2500 mg | ORAL_TABLET | Freq: Two times a day (BID) | ORAL | Status: DC | PRN
  Administered 2017-09-27 – 2017-09-29 (×2): 0.25 mg via ORAL
  Filled 2017-09-26 (×2): qty 1

## 2017-09-26 MED ORDER — ALBUMIN HUMAN 25 % IV SOLN
50.0000 g | Freq: Once | INTRAVENOUS | Status: AC | PRN
  Administered 2017-09-27: 50 g via INTRAVENOUS
  Filled 2017-09-26: qty 200

## 2017-09-26 MED ORDER — INSULIN DETEMIR 100 UNIT/ML ~~LOC~~ SOLN
30.0000 [IU] | Freq: Two times a day (BID) | SUBCUTANEOUS | Status: DC
  Administered 2017-09-26 – 2017-09-30 (×8): 30 [IU] via SUBCUTANEOUS
  Filled 2017-09-26 (×12): qty 0.3

## 2017-09-26 MED ORDER — ONDANSETRON HCL 4 MG/2ML IJ SOLN
4.0000 mg | Freq: Four times a day (QID) | INTRAMUSCULAR | Status: DC | PRN
  Administered 2017-09-27: 4 mg via INTRAVENOUS
  Filled 2017-09-26: qty 2

## 2017-09-26 NOTE — Progress Notes (Signed)
I spoke with Dr. Melina Copa in the ED as well as Dr. Myna Hidalgo (admitting hospitalist). Dr. Myna Hidalgo does not feel formal cardiology consultation is warranted at this time.  Plans are to transfuse PRBC's and obtain a follow up echocardiogram and possibly administer some IV Lasix. Should something change, I told Dr. Myna Hidalgo we would be glad to assist with management.

## 2017-09-26 NOTE — ED Notes (Signed)
EDP at bedside updating patient and family. 

## 2017-09-26 NOTE — Telephone Encounter (Signed)
[name] called back stating his hemoglobin is 7.3. Alyse Low states patient is very weak and she thinks he needs to go to the hospital. Alyse Low requested a call back ASAP. Please advise

## 2017-09-26 NOTE — ED Provider Notes (Signed)
[facility] EMERGENCY DEPARTMENT Provider Note   CSN: [id number] Arrival date & time: 09/26/17  1142     History   Chief Complaint Chief Complaint  Patient presents with  . Rectal Bleeding    HPI [name] is a 72 y.o. male. 72 year old male with chronic A. fib not on anticoagulation now due to prior GI bleed, coronary disease CABG aortic valve replacement, history of AVMs with recurrent GI bleeds.    He presents today after having outpatient blood work done that showed his hemoglobin is dropped again.  He has been noticing increased dyspnea on exertion and orthopnea for the last few days.  There is been a dry cough.  He had one episode of wiping after a BM and having red blood on the toilet paper.  He has chronic dark stools.  He was last admitted about a week ago and received 2 units of blood.  He follows with Dr. Ramadan who recommended that he come to the emergency department for evaluation.  He denies any chest pain or abdominal pain.  He states he gets short of breath with about 8 steps and it improves with rest.    The history is provided by the patient.  Rectal Bleeding  Quality:  Unable to specify Amount:  Unable to specify Timing:  Intermittent Chronicity:  Chronic Similar prior episodes: yes   Relieved by:  Nothing Associated symptoms: dizziness and light-headedness   Associated symptoms: no abdominal pain, no epistaxis, no fever, no hematemesis, no loss of consciousness and no vomiting   Risk factors: liver disease   Risk factors: no anticoagulant use     Past Medical History:  Diagnosis Date  . Aortic stenosis 03/08/2012   a.  s/p tissue AVR 10/13 with Dr. [name];   b. Echo 10/13: mod LVH, EF 55-60%, tissue AVR not well seen, no leak, gradient not too high (mean 17mHg), MAC, mild MR, mild LAE, PASP 38  . Ascites    status post paracentesis with removal of 3.4 L of ascitic fluid  . Atrial fibrillation ([place]    Permanent; off of Coumadin for now due to  GI bleed  . AVM (arteriovenous malformation)    Recurrent GI bleeding requiring multiple transfusions  . CAD (coronary artery disease)    a. s/p CABG 2004;  b. [place]:  LHC 8/13: Free radial to obtuse marginal patent, SVG-diagonal patent, LIMA-LAD patent, EF 65-70%, mean aortic valve gradient 42  . Carotid bruit 06/14/2011   a. pre-AVR dopplers 10/13: no sig ICA stenosis  . Chronic diastolic heart failure ([place]   . Chronic kidney disease   . Cirrhosis ([place]   . COPD (chronic obstructive pulmonary disease) ([place]   . Depression   . DM2 (diabetes mellitus, type 2) (HCC)    at least 10 yrs  . Dyspnea   . GERD (gastroesophageal reflux disease)   . Gout   . H/O hiatal hernia   . Headache   . Hyperlipidemia   . Hyperlipidemia   . Hypertension    x 15 yrs  . Insomnia   . Iron deficiency anemia    Requiring intravenous iron  . Mediastinal adenopathy 09/22/2011  . OSA (obstructive sleep apnea) 1999    USES CPAP  . Osteoarthritis   . Overweight(278.02)   . Presence of permanent cardiac pacemaker   . Thrombocytopenia ([facility]     Patient Active Problem List   Diagnosis Date Noted  . Symptomatic anemia 09/07/2017  . COPD (chronic obstructive pulmonary  disease) ([place]) 09/07/2017  . Gout 09/07/2017  . Palliative care encounter   . Goals of care, counseling/discussion   . Encounter for hospice care discussion   . Elevated troponin   . Acute blood loss anemia 08/24/2017  . Upper GI bleed 08/24/2017  . Cirrhosis of liver with ascites ([place]) 08/24/2017  . Chronic diastolic CHF (congestive heart failure) ([place]) 08/24/2017  . Acute renal failure superimposed on stage 4 chronic kidney disease ([place]) 08/24/2017  . Hyponatremia 08/24/2017  . Gastrointestinal hemorrhage   . Anemia due to chronic renal failure treated with erythropoietin, stage 3 (moderate) ([place]) 04/25/2017  . Blood in stool 10/20/2016  . Polydipsia 11/11/2015  . OSA (obstructive sleep apnea) 09/23/2014  . S/P AV nodal  ablation 04/18/14 04/19/2014  . Aortic stenosis, severe with S/P AVR  04/19/2014  . Atrioventricular block, complete ([place]) 04/18/2014  . CKD (chronic kidney disease) 11/05/2013  . Unspecified gastritis and gastroduodenitis without mention of hemorrhage 07/31/2013  . Cough 06/28/2012  . Dyspnea 06/27/2012  . Permanent atrial fibrillation ([place]) 03/02/2012  . History of GI bleed 03/02/2012  . GERD (gastroesophageal reflux disease) 03/02/2012  . Type 2 diabetes mellitus ([place]) 03/02/2012  . Cirrhosis ([place])   . Mediastinal adenopathy 09/22/2011  . Osteoarthritis   . Thrombocytopenia ([place])   . Gynecomastia 09/20/2011  . Ascites 09/20/2011  . Iron deficiency anemia   . Hyperlipidemia   . Essential hypertension   . CHF (congestive heart failure) ([place])   . 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 Carotid bruit 06/14/2011  . ANEMIA 05/17/2010  . Overweight 05/02/2009  . CAD, ARTERY BYPASS GRAFT 05/02/2009  . 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    Past Surgical History:  Procedure Laterality Date  . ABLATION  04-18-14   AVN ablation by Dr Caryl Comes  . AORTIC VALVE REPLACEMENT  04/25/2012   Procedure: AORTIC VALVE REPLACEMENT (AVR);  Surgeon: Melrose Nakayama, MD;  Location: [place];  Service: Open Heart Surgery;  Laterality: N/A;  . AV NODE ABLATION N/A 04/18/2014   Procedure: AV NODE ABLATION;  Surgeon: Deboraha Sprang, MD;  Location[facility] CATH LAB;  Service: Cardiovascular;  Laterality: N/A;  . CARPAL TUNNEL RELEASE    10/08/2003  . CATARACT EXTRACTION W/ INTRAOCULAR LENS IMPLANT Bilateral 09/28/2015 , 10/19/2015  . COLONOSCOPY WITH PROPOFOL N/A 10/28/2016   Procedure: COLONOSCOPY WITH PROPOFOL;  Surgeon: Rogene Houston, MD;  [facility];  Service: Endoscopy;  Laterality: N/A;  . CORONARY ANGIOPLASTY WITH STENT PLACEMENT  01/19/2005   drug eluting stent to high grade ostial stenosis of radial artery graft to OM  . CORONARY ARTERY BYPASS GRAFT   10/15/2002    [name]. [name],  M.D.     . ESOPHAGOGASTRODUODENOSCOPY N/A 07/31/2013   Procedure: ESOPHAGOGASTRODUODENOSCOPY (EGD);  Surgeon: Milus Banister, MD;  Location: [place];  Service: Endoscopy;  Laterality: N/A;  . ESOPHAGOGASTRODUODENOSCOPY Left 01/11/2014   Procedure: ESOPHAGOGASTRODUODENOSCOPY (EGD);  Surgeon: Juanita Craver, MD;  Location: [facility] ENDOSCOPY;  Service: Endoscopy;  Laterality: Left;  . ESOPHAGOGASTRODUODENOSCOPY (EGD) WITH PROPOFOL N/A 10/28/2016   Procedure: ESOPHAGOGASTRODUODENOSCOPY (EGD) WITH PROPOFOL;  Surgeon: Rogene Houston, MD;  [facility];  Service: Endoscopy;  Laterality: N/A;  . ESOPHAGOGASTRODUODENOSCOPY (EGD) WITH PROPOFOL N/A 08/25/2017   Procedure: ESOPHAGOGASTRODUODENOSCOPY (EGD) WITH PROPOFOL;  Surgeon: Rogene Houston, MD;  [facility];  Service: Endoscopy;  Laterality: N/A;  . GIVENS CAPSULE STUDY N/A 08/26/2017   Procedure: GIVENS CAPSULE STUDY;  Surgeon: Danie Binder, MD;  [facility];  Service: Endoscopy;  Laterality: N/A;  . HERNIA REPAIR    . IR PARACENTESIS  09/14/2017  . KNEE ARTHROSCOPY Right 01/20/2016   Procedure: ARTHROSCOPY RIGHT KNEE WITH MENSICAL DEBRIDEMENT;  Surgeon: Gaynelle Arabian, MD;  Location: [facility];  Service: Orthopedics;  Laterality: Right;  . LEFT AND RIGHT HEART CATHETERIZATION WITH CORONARY/GRAFT ANGIOGRAM N/A 03/07/2012   Procedure: LEFT AND RIGHT HEART CATHETERIZATION WITH Beatrix Fetters;  Surgeon: Sherren Mocha, MD;  Location: [facility] CATH LAB;  Service: Cardiovascular;  Laterality: N/A;  . lipoma surgery    . OTHER SURGICAL HISTORY  08/26/2011   [facility],  enteroscopy , revealing "three-four AVMs."   . PACEMAKER INSERTION  03-24-14   STJ Assurity single chamber pacemaker implanted by Dr Caryl Comes  . PARACENTESIS  12/2015  . PERMANENT PACEMAKER INSERTION N/A 03/24/2014   Procedure: PERMANENT PACEMAKER INSERTION;  Surgeon: Deboraha Sprang, MD;  Location: [facility] CATH LAB;  Service: Cardiovascular;  Laterality: N/A;  . POLYPECTOMY   10/28/2016   Procedure: POLYPECTOMY;  Surgeon: Rogene Houston, MD;  [facility];  Service: Endoscopy;;  gastric  . RIGHT HEART CATHETERIZATION N/A 01/01/2014   Procedure: RIGHT HEART CATH;  Surgeon: Jolaine Artist, MD;  Location: [facility] CATH LAB;  Service: Cardiovascular;  Laterality: N/A;  . TEE WITHOUT CARDIOVERSION  03/07/2012   Procedure: TRANSESOPHAGEAL ECHOCARDIOGRAM (TEE);  Surgeon: Thayer Headings, MD;  Location: [place];  Service: Cardiovascular;  Laterality: N/A;       Home Medications    Prior to Admission medications   Medication Sig Start Date End Date Taking? Authorizing Provider  acetaminophen (TYLENOL) 500 MG tablet Take 1,000 mg by mouth at bedtime as needed for mild pain or headache.     [provider]  allopurinol (ZYLOPRIM) 100 MG tablet Take 1 tablet (100 mg total) by mouth daily. 04/18/17   Larey Dresser, MD  atorvastatin (LIPITOR) 20 MG tablet Take 20 mg by mouth daily at 6 PM.     [provider]  colchicine 0.6 MG tablet Take 0.6 mg by mouth daily.     [provider]  gabapentin (NEURONTIN) 300 MG capsule Take 300 mg by mouth 3 (three) times daily. 12/31/14   [provider]  insulin NPH Human (HUMULIN N,NOVOLIN N) 100 UNIT/ML injection Inject 65 Units into the skin 2 (two) times daily before a meal.     [provider]  insulin regular (NOVOLIN R,HUMULIN R) 100 units/mL injection Inject 30 Units into the skin 2 (two) times daily before a meal.     [provider]  metolazone (ZAROXOLYN) 5 MG tablet Take 1 tablet (5 mg total) by mouth every other day. 09/14/17   Larey Dresser, MD  metoprolol succinate (TOPROL-XL) 25 MG 24 hr tablet Take 1 tablet (25 mg total) by mouth 2 (two) times daily. 08/27/17   Orson Eva, MD  Multiple Vitamins-Minerals ([employer] SPECTRAVITE ADULT 50+ PO) Take 1 tablet by mouth daily.    [provider]  pantoprazole (PROTONIX) 40 MG tablet Take 1 tablet (40 mg total) by  mouth 2 (two) times daily before a meal. 08/27/17   Tat, Shanon Brow, MD  potassium chloride SA (K-DUR,KLOR-CON) 20 MEQ tablet Take 2 tablets (40 mEq total) by mouth 2 (two) times daily. 07/13/17   Larey Dresser, MD  PROAIR HFA 108 [id number] Base) MCG/ACT inhaler Take 2 puffs by mouth 2 (two) times daily. 03/28/17   [provider]  sertraline (ZOLOFT) 50 MG  tablet Take 1 tablet (50 mg total) by mouth daily. 09/01/17   Larey Dresser, MD  spironolactone (ALDACTONE) 50 MG tablet Take 1 tablet (50 mg total) by mouth 2 (two) times daily. 04/25/17   Larey Dresser, MD  tamsulosin (FLOMAX) 0.4 MG CAPS capsule TAKE ONE CAPSULE BY MOUTH EVERY DAY AFTER SUPPER 07/11/17   Larey Dresser, MD  torsemide (DEMADEX) 20 MG tablet Take 6 tablets (120 mg total) by mouth 2 (two) times daily. 06/29/17   Larey Dresser, MD  vitamin C (ASCORBIC ACID) 500 MG tablet Take 500 mg by mouth 2 (two) times daily.    [provider]    Family History Family History  Problem Relation Age of Onset  . Hypertension Father   . Diabetes Father   . Heart disease Father   . COPD Sister   . Cancer Maternal Aunt        Breast cancer   . Cancer Maternal Aunt        Breast cancer   . Diabetes Son   . Cancer Daughter        Cervical cancer  . Coronary artery disease Unknown        FAMILY HISTORY    Social History Social History   Tobacco Use  . Smoking status: Former Smoker    Packs/day: 1.00    Years: 30.00    Pack years: 30.00    Types: Cigarettes    Last attempt to quit: 07/25/2000    Years since quitting: 17.1  . Smokeless tobacco: Never Used  . Tobacco comment: Quit smoking in 2002  Substance Use Topics  . Alcohol use: No    Alcohol/week: 0.6 oz    Types: 1 Shots of liquor per week    Comment: stopped drinking alcohol  . Drug use: No     Allergies   Codeine; Diltiazem hcl; Niacin; Aspirin; and Diltiazem hcl   Review of Systems Review of Systems  Constitutional: Negative for chills and fever.   HENT: Negative for ear pain, nosebleeds and sore throat.   Eyes: Negative for pain and visual disturbance.  Respiratory: Positive for shortness of breath. Negative for cough.   Cardiovascular: Negative for chest pain and palpitations.  Gastrointestinal: Positive for blood in stool and hematochezia. Negative for abdominal pain, hematemesis and vomiting.  Genitourinary: Negative for dysuria and hematuria.  Musculoskeletal: Negative for myalgias and neck pain.  Skin: Negative for color change and rash.  Neurological: Positive for dizziness and light-headedness. Negative for seizures, loss of consciousness and syncope.  All other systems reviewed and are negative.    Physical Exam Updated Vital Signs BP 120/61 (BP Location: Right Arm)   Pulse 75   Temp (!) 97.4 F (36.3 C) (Oral)   Resp 16   Ht _0  (1.727 m)   Wt 124.7 kg (275 lb)   SpO2 100%   BMI 41.81 kg/m   Physical Exam  Constitutional: He appears well-developed and well-nourished.  HENT:  Head: Normocephalic and atraumatic.  Eyes: Conjunctivae are normal.  Neck: Neck supple.  Cardiovascular: Normal rate. An irregularly irregular rhythm present.  Murmur heard.  Systolic murmur is present with a grade of 3/6. Pulmonary/Chest: Breath sounds normal. Accessory muscle usage present. No respiratory distress.  Abdominal: Soft. He exhibits distension. There is no tenderness.  Musculoskeletal: He exhibits edema. He exhibits no tenderness or deformity.  Neurological: He is alert.  Skin: Skin is warm and dry. Capillary refill takes less than 2 seconds.  Psychiatric: He has a normal mood and affect.  Nursing note and vitals reviewed.    ED Treatments / Results  Labs (all labs ordered are listed, but only abnormal results are displayed) Labs Reviewed  COMPREHENSIVE METABOLIC PANEL  CBC  TROPONIN I  POC OCCULT BLOOD, ED  TYPE AND SCREEN    EKG  EKG Interpretation  Date/Time:  Tuesday September 26 2017 13:45:25  EST Ventricular Rate:  75 PR Interval:    QRS Duration: 173 QT Interval:  462 QTC Calculation: 517 R Axis:   -85 Text Interpretation:  Ventricular-paced rhythm No further analysis attempted due to paced rhythm Baseline wander in lead(s) V2 similar to prior Confirmed by Aletta Edouard [id number]) on 09/26/2017 4:04:27 PM       Radiology US Paracentesis  Result Date: 09/27/2017 INDICATION: Cirrhosis, recurrent ascites EXAM: ULTRASOUND GUIDED THERAPEUTIC PARACENTESIS MEDICATIONS: None. COMPLICATIONS: None immediate. PROCEDURE: Procedure, benefits, and risks of procedure were discussed with patient. Written informed consent for procedure was obtained. Time out protocol followed. Adequate collection of ascites localized by ultrasound in RIGHT lower quadrant. Skin prepped and draped in usual sterile fashion. Skin and soft tissues anesthetized with 10 mL of 1% lidocaine. 5 [place] Yueh catheter placed into peritoneal cavity. 1.3 L of dark amber colored ascitic fluid aspirated by vacuum bottle suction. Procedure tolerated well by patient without immediate complication. FINDINGS: As above IMPRESSION: Successful ultrasound-guided paracentesis yielding 1.3 liters of peritoneal fluid. Electronically Signed   By: Lavonia Dana M.D.   On: 09/27/2017 13:32   Dg Chest Port 1 View  Result Date: 09/26/2017 CLINICAL DATA:  One month history of rectal bleeding. Patient found to have a hemoglobin of 7.1. Patient reports generalized weakness. History of coronary artery disease, CHF, COPD, former smoker, cirrhosis, chronic renal insufficiency. EXAM: PORTABLE CHEST 1 VIEW COMPARISON:  PA and lateral chest x-ray of August 24, 2017 FINDINGS: The lungs are well-expanded. There is no focal infiltrate. The interstitial markings are mildly increased. The cardiac silhouette is enlarged and the pulmonary vascularity is mildly engorged. The patient has undergone previous median sternotomy. The ICD is in reasonable position where  visualized. The observed bony thorax is unremarkable. IMPRESSION: CHF with mild interstitial edema superimposed upon COPD. There is no alveolar pneumonia. Electronically Signed   By: David  Martinique M.D.   On: 09/26/2017 16:14    Procedures .Critical Care Performed by: Hayden Rasmussen, MD Authorized by: Hayden Rasmussen, MD   Critical care provider statement:    Critical care time (minutes):  40   Critical care time was exclusive of:  Separately billable procedures and treating other patients   Critical care was necessary to treat or prevent imminent or life-threatening deterioration of the following conditions:  Cardiac failure and circulatory failure   Critical care was time spent personally by me on the following activities:  Development of treatment plan with patient or surrogate, discussions with consultants, discussions with primary provider, evaluation of patient's response to treatment, examination of patient, obtaining history from patient or surrogate, ordering and performing treatments and interventions, ordering and review of laboratory studies, ordering and review of radiographic studies, pulse oximetry, re-evaluation of patient's condition and review of old charts   I assumed direction of critical care for this patient from another provider in my specialty: no     (including critical care time)  Medications Ordered in ED Medications  allopurinol (ZYLOPRIM) tablet 100 mg (100 mg Oral Given 09/27/17 0923)  atorvastatin (LIPITOR) tablet 20 mg (20 mg Oral Given  09/26/17 1830)  gabapentin (NEURONTIN) capsule 300 mg (300 mg Oral Given 09/27/17 1652)  metoprolol succinate (TOPROL-XL) 24 hr tablet 25 mg (25 mg Oral Given 09/27/17 0924)  albuterol (PROVENTIL) (2.5 MG/3ML) 0.083% nebulizer solution 3 mL (not administered)  sertraline (ZOLOFT) tablet 50 mg (50 mg Oral Given 09/27/17 0924)  tamsulosin (FLOMAX) capsule 0.4 mg (0.4 mg Oral Given 09/26/17 1830)  sodium chloride flush (NS) 0.9 %  injection 3 mL (3 mLs Intravenous Given 09/27/17 0925)  sodium chloride flush (NS) 0.9 % injection 3 mL (not administered)  0.9 %  sodium chloride infusion (not administered)  acetaminophen (TYLENOL) tablet 650 mg (650 mg Oral Given 09/27/17 0747)  ondansetron (ZOFRAN) injection 4 mg (4 mg Intravenous Given 09/27/17 1333)  insulin detemir (LEVEMIR) injection 30 Units (30 Units Subcutaneous Given 09/27/17 0951)  insulin aspart (novoLOG) injection 0-15 Units (0 Units Subcutaneous Not Given 09/27/17 1610)  ALPRAZolam (XANAX) tablet 0.25 mg (0.25 mg Oral Given 09/27/17 0923)  pantoprazole (PROTONIX) injection 40 mg (40 mg Intravenous Given 09/27/17 0923)  torsemide (DEMADEX) tablet 120 mg (not administered)  0.9 %  sodium chloride infusion ( Intravenous Hold 09/27/17 1330)  0.9 %  sodium chloride infusion (0 mL/hr Intravenous Stopped 09/26/17 2236)  0.9 %  sodium chloride infusion ( Intravenous Stopped 09/26/17 2000)  albumin human 25 % solution 50 g (50 g Intravenous New Bag/Given 09/27/17 1258)     Initial Impression / Assessment and Plan / ED Course  I have reviewed the triage vital signs and the nursing notes.  Pertinent labs & imaging results that were available during my care of the patient were reviewed by me and considered in my medical decision making (see chart for details).  Clinical Course as of Sep 27 1800  Tue Sep 26, 2017  1433 Lab is not crossing over so we are getting things faxed to [place].  Patient's H&H here is 7.3 and 24.  Blood is been ordered.  We are finding out from the labs that he is a difficult crossmatch blood will need to be delivered from outside this facility.  [MB]  9242 ECG is heart rate is 75 ventricularly paced no ectopy similar to prior EKGs.  [MB]  1613 Troponin elevated at 1.7.  EKG is paced so difficult to see if there is any ischemia underneath that.  [MB]  6834 Awaiting callback from cardiology.  I reviewed with the hospitalist Dr. Myna Hidalgo.  Who will evaluate the patient here and  recommended letting him know if carts is going to want him transferred over to [facility].  [MB]  1632 Discussed with cardiology on-call Dr. Aviva Signs who ultimately feels the patient can stay at this site and would just recommend transfusions and serial troponins.  The patient is not a candidate for any procedures and cannot be anticoagulated.  [MB]  1962 Discussed with Dr. Jearld Adjutant who will see the patient tomorrow.  [MB]    Clinical Course User Index [MB] Hayden Rasmussen, MD     Final Clinical Impressions(s) / ED Diagnoses   Final diagnoses:  Gastrointestinal hemorrhage, unspecified gastrointestinal hemorrhage type  Anemia, unspecified type  Non-ST elevation (NSTEMI) myocardial infarction [facility])    ED Discharge Orders    None       Hayden Rasmussen, MD 09/27/17 1805

## 2017-09-26 NOTE — ED Notes (Signed)
Date and time results received: 09/26/17 1612  Test: Troponin Critical Value: 1.70  Name of Provider Notified: Dr. Melina Copa  Orders Received? Or Actions Taken?: None given

## 2017-09-26 NOTE — ED Notes (Signed)
Pt given ginger ale.

## 2017-09-26 NOTE — Telephone Encounter (Signed)
Per Dr.Rehman the patient should go to the Emergency Room. Patient's daughter ,Alyse Low was called and made aware. She is on her way with him now to ED @[facility] .

## 2017-09-26 NOTE — Progress Notes (Addendum)
[id number] [place] [id number]  Released Emergent and verified with 2 RNs and Lauren in the lab. Computer error known problem   Started at 2056  [place] Gershon Cull RN

## 2017-09-26 NOTE — H&P (Signed)
History and Physical    [name] JYN:[id number] DOB: 1946-03-19 DOA: 09/26/2017  PCP: [name], MD   Patient coming from: Home  Chief Complaint: SOB, orthopnea, fatigue   HPI: [name] is a 72 y.o. male with medical history significant for coronary artery disease status post CABG, aortic stenosis status post AVR, chronic kidney disease stage IV, cirrhosis with ascites, atrial fibrillation not on anticoagulation, chronic diastolic CHF, insulin-dependent diabetes mellitus, and chronic GI blood loss, now presenting to the emergency department for evaluation of progressive dyspnea, orthopnea, and fatigue.  He had outpatient blood work concerning for low hemoglobin and was directed to the ED.  He had been admitted twice last month under similar circumstances and receive transfusions.  Bleeding was felt most likely secondary to small bowel AVMs.  He denies chest pain.  Denies fevers or chills.  Denies abdominal pain or hematemesis.  ED Course: Upon arrival to the ED, patient is found to be afebrile, saturating well on room air, blood pressure 100/50, and vitals otherwise normal.  EKG features a paced rhythm and chest x-ray is notable for CHF with interstitial edema.  Chemistry panel reveals a creatinine of 2.65, similar to priors, and a chronic hyponatremia with sodium 124.  CBC is notable for hemoglobin of 7.3, down from 8.6 a week ago.  Troponin is elevated at 1.70.  2 units of packed red blood cells were ordered for immediate transfusion from the ED.  ED physician discussed the case with cardiology and recommendations for medical admission and medical management were given by phone.  Patient remains hemodynamically stable and will be admitted to the stepdown unit for ongoing evaluation and management of dyspnea with orthopnea suspected multifactorial with contributions from acute on chronic anemia, acute on chronic diastolic CHF, and marked abdominal ascites.  Review of Systems:  All  other systems reviewed and apart from HPI, are negative.  Past Medical History:  Diagnosis Date  . Aortic stenosis 03/08/2012   a.  s/p tissue AVR 10/13 with Dr. [name];   b. Echo 10/13: mod LVH, EF 55-60%, tissue AVR not well seen, no leak, gradient not too high (mean 33mHg), MAC, mild MR, mild LAE, PASP 38  . Ascites    status post paracentesis with removal of 3.4 L of ascitic fluid  . Atrial fibrillation ([place]    Permanent; off of Coumadin for now due to GI bleed  . AVM (arteriovenous malformation)    Recurrent GI bleeding requiring multiple transfusions  . CAD (coronary artery disease)    a. s/p CABG 2004;  b. [place]:  LHC 8/13: Free radial to obtuse marginal patent, SVG-diagonal patent, LIMA-LAD patent, EF 65-70%, mean aortic valve gradient 42  . Carotid bruit 06/14/2011   a. pre-AVR dopplers 10/13: no sig ICA stenosis  . Chronic diastolic heart failure ([facility]   . Chronic kidney disease   . Cirrhosis ([facility]   . COPD (chronic obstructive pulmonary disease) ([place]   . Depression   . DM2 (diabetes mellitus, type 2) (HCC)    at least 10 yrs  . Dyspnea   . GERD (gastroesophageal reflux disease)   . Gout   . H/O hiatal hernia   . Headache   . Hyperlipidemia   . Hyperlipidemia   . Hypertension    x 15 yrs  . Insomnia   . Iron deficiency anemia    Requiring intravenous iron  . Mediastinal adenopathy 09/22/2011  . OSA (obstructive sleep apnea) 1999    USES CPAP  .  Osteoarthritis   . Overweight(278.02)   . Presence of permanent cardiac pacemaker   . Thrombocytopenia ([place])     Past Surgical History:  Procedure Laterality Date  . ABLATION  04-18-14   AVN ablation by Dr Caryl Comes  . AORTIC VALVE REPLACEMENT  04/25/2012   Procedure: AORTIC VALVE REPLACEMENT (AVR);  Surgeon: Melrose Nakayama, MD;  Location: [facility];  Service: Open Heart Surgery;  Laterality: N/A;  . AV NODE ABLATION N/A 04/18/2014   Procedure: AV NODE ABLATION;  Surgeon: Deboraha Sprang, MD;  Location: [facility] CATH  LAB;  Service: Cardiovascular;  Laterality: N/A;  . CARPAL TUNNEL RELEASE    10/08/2003  . CATARACT EXTRACTION W/ INTRAOCULAR LENS IMPLANT Bilateral 09/28/2015 , 10/19/2015  . COLONOSCOPY WITH PROPOFOL N/A 10/28/2016   Procedure: COLONOSCOPY WITH PROPOFOL;  Surgeon: Rogene Houston, MD;  [facility];  Service: Endoscopy;  Laterality: N/A;  . CORONARY ANGIOPLASTY WITH STENT PLACEMENT  01/19/2005   drug eluting stent to high grade ostial stenosis of radial artery graft to OM  . CORONARY ARTERY BYPASS GRAFT   10/15/2002    [name]. [name], M.D.     . ESOPHAGOGASTRODUODENOSCOPY N/A 07/31/2013   Procedure: ESOPHAGOGASTRODUODENOSCOPY (EGD);  Surgeon: Milus Banister, MD;  Location: [facility];  Service: Endoscopy;  Laterality: N/A;  . ESOPHAGOGASTRODUODENOSCOPY Left 01/11/2014   Procedure: ESOPHAGOGASTRODUODENOSCOPY (EGD);  Surgeon: Juanita Craver, MD;  Location: [facility] ENDOSCOPY;  Service: Endoscopy;  Laterality: Left;  . ESOPHAGOGASTRODUODENOSCOPY (EGD) WITH PROPOFOL N/A 10/28/2016   Procedure: ESOPHAGOGASTRODUODENOSCOPY (EGD) WITH PROPOFOL;  Surgeon: Rogene Houston, MD;  [facility];  Service: Endoscopy;  Laterality: N/A;  . ESOPHAGOGASTRODUODENOSCOPY (EGD) WITH PROPOFOL N/A 08/25/2017   Procedure: ESOPHAGOGASTRODUODENOSCOPY (EGD) WITH PROPOFOL;  Surgeon: Rogene Houston, MD;  [facility];  Service: Endoscopy;  Laterality: N/A;  . GIVENS CAPSULE STUDY N/A 08/26/2017   Procedure: GIVENS CAPSULE STUDY;  Surgeon: Danie Binder, MD;  [facility];  Service: Endoscopy;  Laterality: N/A;  . HERNIA REPAIR    . IR PARACENTESIS  09/14/2017  . KNEE ARTHROSCOPY Right 01/20/2016   Procedure: ARTHROSCOPY RIGHT KNEE WITH MENSICAL DEBRIDEMENT;  Surgeon: Gaynelle Arabian, MD;  Location: [facility];  Service: Orthopedics;  Laterality: Right;  . LEFT AND RIGHT HEART CATHETERIZATION WITH CORONARY/GRAFT ANGIOGRAM N/A 03/07/2012   Procedure: LEFT AND RIGHT HEART CATHETERIZATION WITH  Beatrix Fetters;  Surgeon: Sherren Mocha, MD;  Location: [facility] CATH LAB;  Service: Cardiovascular;  Laterality: N/A;  . lipoma surgery    . OTHER SURGICAL HISTORY  08/26/2011   [facility],  enteroscopy , revealing "three-four AVMs."   . PACEMAKER INSERTION  03-24-14   STJ Assurity single chamber pacemaker implanted by Dr Caryl Comes  . PARACENTESIS  12/2015  . PERMANENT PACEMAKER INSERTION N/A 03/24/2014   Procedure: PERMANENT PACEMAKER INSERTION;  Surgeon: Deboraha Sprang, MD;  Location: [facility] CATH LAB;  Service: Cardiovascular;  Laterality: N/A;  . POLYPECTOMY  10/28/2016   Procedure: POLYPECTOMY;  Surgeon: Rogene Houston, MD;  [facility];  Service: Endoscopy;;  gastric  . RIGHT HEART CATHETERIZATION N/A 01/01/2014   Procedure: RIGHT HEART CATH;  Surgeon: Jolaine Artist, MD;  Location: [facility] CATH LAB;  Service: Cardiovascular;  Laterality: N/A;  . TEE WITHOUT CARDIOVERSION  03/07/2012   Procedure: TRANSESOPHAGEAL ECHOCARDIOGRAM (TEE);  Surgeon: Thayer Headings, MD;  Location: [facility] ENDOSCOPY;  Service: Cardiovascular;  Laterality: N/A;     reports that he quit smoking about 17 years ago. His smoking  use included cigarettes. He has a 30.00 pack-year smoking history. he has never used smokeless tobacco. He reports that he does not drink alcohol or use drugs.  Allergies  Allergen Reactions  . Codeine Other (See Comments) and Palpitations    Hurting in chest  . Diltiazem Hcl Itching  . Niacin Other (See Comments)    Felt a "severe burning sensation" Hot flashes  . Aspirin     History of Bleeding ulcers   . Diltiazem Hcl Other (See Comments) and Hives    Gets hot    Family History  Problem Relation Age of Onset  . Hypertension Father   . Diabetes Father   . Heart disease Father   . COPD Sister   . Cancer Maternal Aunt        Breast cancer   . Cancer Maternal Aunt        Breast cancer   . Diabetes Son   . Cancer Daughter        Cervical cancer  . Coronary artery disease Unknown         FAMILY HISTORY     Prior to Admission medications   Medication Sig Start Date End Date Taking? Authorizing Provider  acetaminophen (TYLENOL) 500 MG tablet Take 1,000 mg by mouth at bedtime as needed for mild pain or headache.    Yes [provider]  allopurinol (ZYLOPRIM) 100 MG tablet Take 1 tablet (100 mg total) by mouth daily. 04/18/17  Yes Larey Dresser, MD  atorvastatin (LIPITOR) 20 MG tablet Take 20 mg by mouth daily at 6 PM.    Yes [provider]  Chlorpheniramine Maleate (ALLERGY PO) Take 1 tablet by mouth daily.   Yes [provider]  gabapentin (NEURONTIN) 300 MG capsule Take 300 mg by mouth 3 (three) times daily. 12/31/14  Yes [provider]  insulin NPH Human (HUMULIN N,NOVOLIN N) 100 UNIT/ML injection Inject 65 Units into the skin 2 (two) times daily before a meal.    Yes [provider]  insulin regular (NOVOLIN R,HUMULIN R) 100 units/mL injection Inject 30 Units into the skin 2 (two) times daily before a meal.    Yes [provider]  metolazone (ZAROXOLYN) 5 MG tablet Take 1 tablet (5 mg total) by mouth every other day. 09/14/17  Yes Larey Dresser, MD  metoprolol succinate (TOPROL-XL) 25 MG 24 hr tablet Take 1 tablet (25 mg total) by mouth 2 (two) times daily. 08/27/17  Yes Tat, Shanon Brow, MD  Multiple Vitamins-Minerals ([employer] SPECTRAVITE ADULT 50+ PO) Take 1 tablet by mouth daily.   Yes [provider]  pantoprazole (PROTONIX) 40 MG tablet Take 1 tablet (40 mg total) by mouth 2 (two) times daily before a meal. 08/27/17  Yes Tat, Shanon Brow, MD  potassium chloride SA (K-DUR,KLOR-CON) 20 MEQ tablet Take 2 tablets (40 mEq total) by mouth 2 (two) times daily. Patient taking differently: Take 40 mEq by mouth See admin instructions. Take 40 meq twice a day every other day along with Metolazone. 07/13/17  Yes Larey Dresser, MD  PROAIR HFA 108 [phone] Base) MCG/ACT inhaler Take 2 puffs by mouth 2 (two) times daily. 03/28/17  Yes  [provider]  sertraline (ZOLOFT) 50 MG tablet Take 1 tablet (50 mg total) by mouth daily. 09/01/17  Yes Larey Dresser, MD  spironolactone (ALDACTONE) 50 MG tablet Take 1 tablet (50 mg total) by mouth 2 (two) times daily. 04/25/17  Yes Larey Dresser, MD  tamsulosin (FLOMAX) 0.4 MG  CAPS capsule TAKE ONE CAPSULE BY MOUTH EVERY DAY AFTER SUPPER 07/11/17  Yes Larey Dresser, MD  torsemide (DEMADEX) 20 MG tablet Take 6 tablets (120 mg total) by mouth 2 (two) times daily. 06/29/17  Yes Larey Dresser, MD  vitamin C (ASCORBIC ACID) 500 MG tablet Take 500 mg by mouth 2 (two) times daily.   Yes [provider]    Physical Exam: Vitals:   09/26/17 1400 09/26/17 1430 09/26/17 1500 09/26/17 1530  BP: (!) 101/48 117/69 (!) 116/58 108/68  Pulse: 76 75 75 75  Resp: _0 Temp:      TempSrc:      SpO2: 98% 95% 97% 96%  Weight:      Height:          Constitutional: NAD, calm  Eyes: PERTLA, lids and conjunctivae normal ENMT: Mucous membranes are moist. Posterior pharynx clear of any exudate or lesions.   Neck: normal, supple, no masses, no thyromegaly Respiratory: Mild dyspnea with speech, rales bilatrerally. No accessory muscle use.  Cardiovascular: S1 & S2 heard, regular rate and rhythm. 2+ pretibial edema bilaterally. Abdomen: marked distension, soft, no tenderness. Bowel sounds active.  Musculoskeletal: no clubbing / cyanosis. No joint deformity upper and lower extremities.    Skin: no significant rashes, lesions, ulcers. Warm, dry, well-perfused. Neurologic: CN 2-12 grossly intact. Sensation intact. Strength 5/5 in all 4 limbs.  Psychiatric: Alert and oriented x 3. Calm, cooperative.     Labs on Admission: I have personally reviewed following labs and imaging studies  CBC: Recent Labs  Lab 09/20/17 1156 09/26/17 0927 09/26/17 1219  WBC  --  8.7 8.9  HGB 8.6* 7.3* 7.3*  HCT 28.6* 23.9* 24.0*  MCV  --  81.0 81.1  PLT  --  161 876   Basic Metabolic  Panel: Recent Labs  Lab 09/26/17 0927 09/26/17 1400  NA 127* 124*  K 4.6 4.2  CL 93* 89*  CO2 21* 21*  GLUCOSE 237* 272*  BUN 112* 112*  CREATININE 2.69* 2.65*  CALCIUM 8.9 9.0   GFR: Estimated Creatinine Clearance: 33.8 mL/min (A) (by C-G formula based on SCr of 2.65 mg/dL (H)). Liver Function Tests: Recent Labs  Lab 09/26/17 1400  AST 25  ALT 18  ALKPHOS 79  BILITOT 0.6  PROT 7.5  ALBUMIN 3.5   No results for input(s): LIPASE, AMYLASE in the last 168 hours. No results for input(s): AMMONIA in the last 168 hours. Coagulation Profile: No results for input(s): INR, PROTIME in the last 168 hours. Cardiac Enzymes: Recent Labs  Lab 09/26/17 1400  TROPONINI 1.70*   BNP (last 3 results) No results for input(s): PROBNP in the last 8760 hours. HbA1C: No results for input(s): HGBA1C in the last 72 hours. CBG: No results for input(s): GLUCAP in the last 168 hours. Lipid Profile: No results for input(s): CHOL, HDL, LDLCALC, TRIG, CHOLHDL, LDLDIRECT in the last 72 hours. Thyroid Function Tests: No results for input(s): TSH, T4TOTAL, FREET4, T3FREE, THYROIDAB in the last 72 hours. Anemia Panel: No results for input(s): VITAMINB12, FOLATE, FERRITIN, TIBC, IRON, RETICCTPCT in the last 72 hours. Urine analysis:    Component Value Date/Time   COLORURINE YELLOW 08/25/2017 [place] 08/25/2017 0842   LABSPEC 1.009 08/25/2017 0842   PHURINE 5.0 08/25/2017 0842   GLUCOSEU NEGATIVE 08/25/2017 0842   HGBUR NEGATIVE 08/25/2017 0842   [place] NEGATIVE 08/25/2017 0842   [place] 08/25/2017 0842   PROTEINUR NEGATIVE 08/25/2017 8115  UROBILINOGEN 0.2 04/17/2012 1430   NITRITE NEGATIVE 08/25/2017 0842   LEUKOCYTESUR NEGATIVE 08/25/2017 0842   Sepsis Labs: _0 (procalcitonin:4,lacticidven:4) )No results found for this or any previous visit (from the past 240 hour(s)).   Radiological Exams on Admission: Dg Chest Port 1 View  Result Date:  09/26/2017 CLINICAL DATA:  One month history of rectal bleeding. Patient found to have a hemoglobin of 7.1. Patient reports generalized weakness. History of coronary artery disease, CHF, COPD, former smoker, cirrhosis, chronic renal insufficiency. EXAM: PORTABLE CHEST 1 VIEW COMPARISON:  PA and lateral chest x-ray of August 24, 2017 FINDINGS: The lungs are well-expanded. There is no focal infiltrate. The interstitial markings are mildly increased. The cardiac silhouette is enlarged and the pulmonary vascularity is mildly engorged. The patient has undergone previous median sternotomy. The ICD is in reasonable position where visualized. The observed bony thorax is unremarkable. IMPRESSION: CHF with mild interstitial edema superimposed upon COPD. There is no alveolar pneumonia. Electronically Signed   By: David  Martinique M.D.   On: 09/26/2017 16:14    EKG: Independently reviewed. Ventricular-paced.   Assessment/Plan   1. Symptomatic anemia  - Presents with worsening dyspnea and fatigue  - Hgb is 7.3, down from 8.6 one week earlier  - Reports chronically dark stool, denies hematemesis or abdominal pain  - EGD last month with grade 1 varices, portal hypertensive gastropathy, gastric polyps; capsule study last month with small-bowel AVM's; colonoscopy last year with hemorrhoids and lipoma  - Two units RBC's ordered for transfusion, will be diuresed as below, check post-transfusion CBC, continue PPI q12h    2. Acute on chronic diastolic CHF  - Presents with progressive SOB and orthopnea  - Found to have acute on chronic anemia, increased peripheral edema, and interstitial edema on CXR  - Diuresis with Lasix 100 mg IV q12h, continue beta-blocker as tolerated, update echo, follow daily chem panel    3. Elevated troponin; CAD  - Troponin is elevated to 1.70 on admission  - There is no anginal complaint; likely reflects demand ischemia in setting of acute anemia, acute CHF, CKD  - Case discussed with  cardiology, guidance appreciated  - Will trend troponin and update echo, continue beta-blocker as tolerated   4. CKD stage IV  - SCr is 2.65 on admission, similar to priors  - Renally-dose medications, follow daily chem panel while diuresing   5. Cirrhosis with ascites  - Abdomen with marked distension, but not taut, likely contributing to SOB  - Consult request placed for US-guided paracentesis   - Continue diuretics, monitor lytes   6. Hyponatremia  - Serum sodium is 124 on admission, chronic  - Likely secondary to CHF and cirrhosis, will repeat chem panel in am    7. Insulin-dependent DM  - A1c was only 5.0% last month; serum glucose 280 on admission  - Managed at home with Humulin N 65 units BID and Novolin R 30 units BID  - Check CBG q4h, start Levemir 30 units BID with Novolog sliding-scale while in hospital    8. COPD  - No wheezing on admission  - Continue prn albuterol    9. Atrial fibrillation  - In a paced-rhythm on admission  - CHADS-VASc at least 4 (age, CAD, CHF, DM)  - Anticoagulation precluded by chronic GIB - Continue metoprolol as tolerated    DVT prophylaxis: SCD's Code Status: Full  Family Communication: wife and daughter updated at bedside Consults called: Discussed case with cardiology, not formally consulted  Admission status: Inpatient  Vianne Bulls, MD [facility] Pager [phone]  If 7PM-7AM, please contact night-coverage [email or web address] Password [facility]  09/26/2017, 4:49 PM

## 2017-09-26 NOTE — ED Triage Notes (Signed)
Pt c/o of rectal bleeding x1 month. Was recently seen last week for same issue.  Pt was seen at [facility] today and states his hemoglobin was 7.1. Pt feels weak and pale.

## 2017-09-26 NOTE — Telephone Encounter (Signed)
Alyse Low called regarding patient, stating yesterday patient had blood in his stool and he is not feeling well today and he is scheduled to do labs Thursday but he is seeing his cardiologist today and Alyse Low states she is going to have him check patient's hemoglobin. Please call Alyse Low at [phone]

## 2017-09-26 NOTE — ED Notes (Signed)
Paged Dr. Laural Golden to [phone].

## 2017-09-27 ENCOUNTER — Inpatient Hospital Stay (HOSPITAL_COMMUNITY): Payer: Medicare Other

## 2017-09-27 ENCOUNTER — Encounter (HOSPITAL_COMMUNITY): Payer: Self-pay

## 2017-09-27 DIAGNOSIS — K9289 Other specified diseases of the digestive system: Secondary | ICD-10-CM

## 2017-09-27 DIAGNOSIS — I5032 Chronic diastolic (congestive) heart failure: Secondary | ICD-10-CM

## 2017-09-27 DIAGNOSIS — D649 Anemia, unspecified: Secondary | ICD-10-CM

## 2017-09-27 DIAGNOSIS — I361 Nonrheumatic tricuspid (valve) insufficiency: Secondary | ICD-10-CM

## 2017-09-27 DIAGNOSIS — K7460 Unspecified cirrhosis of liver: Secondary | ICD-10-CM

## 2017-09-27 DIAGNOSIS — I340 Nonrheumatic mitral (valve) insufficiency: Secondary | ICD-10-CM

## 2017-09-27 DIAGNOSIS — R188 Other ascites: Secondary | ICD-10-CM

## 2017-09-27 DIAGNOSIS — I342 Nonrheumatic mitral (valve) stenosis: Secondary | ICD-10-CM

## 2017-09-27 LAB — CBC
HEMATOCRIT: 26.3 % — AB (ref 39.0–52.0)
HEMATOCRIT: 28.4 % — AB (ref 39.0–52.0)
HEMOGLOBIN: 8.9 g/dL — AB (ref 13.0–17.0)
Hemoglobin: 8.3 g/dL — ABNORMAL LOW (ref 13.0–17.0)
MCH: 25.9 pg — AB (ref 26.0–34.0)
MCH: 26 pg (ref 26.0–34.0)
MCHC: 31.6 g/dL (ref 30.0–36.0)
MCHC: 31.3 g/dL (ref 30.0–36.0)
MCV: 81.9 fL (ref 78.0–100.0)
MCV: 83 fL (ref 78.0–100.0)
Platelets: 148 10*3/uL — ABNORMAL LOW (ref 150–400)
Platelets: 157 10*3/uL (ref 150–400)
RBC: 3.21 MIL/uL — ABNORMAL LOW (ref 4.22–5.81)
RBC: 3.42 MIL/uL — AB (ref 4.22–5.81)
RDW: 16.8 % — ABNORMAL HIGH (ref 11.5–15.5)
RDW: 17.1 % — ABNORMAL HIGH (ref 11.5–15.5)
WBC: 9.2 10*3/uL (ref 4.0–10.5)
WBC: 8.9 10*3/uL (ref 4.0–10.5)

## 2017-09-27 LAB — GRAM STAIN: Gram Stain: NONE SEEN

## 2017-09-27 LAB — BASIC METABOLIC PANEL
ANION GAP: 14 (ref 5–15)
BUN: 108 mg/dL — AB (ref 6–20)
CO2: 21 mmol/L — AB (ref 22–32)
Calcium: 9.1 mg/dL (ref 8.9–10.3)
Chloride: 91 mmol/L — ABNORMAL LOW (ref 101–111)
Creatinine, Ser: 2.47 mg/dL — ABNORMAL HIGH (ref 0.61–1.24)
GFR calc Af Amer: 29 mL/min — ABNORMAL LOW (ref >60)
GFR calc non Af Amer: 25 mL/min — ABNORMAL LOW (ref >60)
GLUCOSE: 146 mg/dL — AB (ref 65–99)
POTASSIUM: 3.7 mmol/L (ref 3.5–5.1)
Sodium: 126 mmol/L — ABNORMAL LOW (ref 135–145)

## 2017-09-27 LAB — PREPARE RBC (CROSSMATCH)

## 2017-09-27 LAB — ECHOCARDIOGRAM COMPLETE
HEIGHTINCHES: 68 in
Weight: 4229.3 oz

## 2017-09-27 LAB — TROPONIN I
TROPONIN I: 0.23 ng/mL — AB (ref <0.03)
Troponin I: 0.77 ng/mL (ref <0.03)
Troponin I: 0.45 ng/mL (ref <0.03)
Troponin I: 0.39 ng/mL (ref <0.03)

## 2017-09-27 LAB — GLUCOSE, CAPILLARY
GLUCOSE-CAPILLARY: 132 mg/dL — AB (ref 65–99)
GLUCOSE-CAPILLARY: 119 mg/dL — AB (ref 65–99)
Glucose-Capillary: 131 mg/dL — ABNORMAL HIGH (ref 65–99)
Glucose-Capillary: 143 mg/dL — ABNORMAL HIGH (ref 65–99)
Glucose-Capillary: 171 mg/dL — ABNORMAL HIGH (ref 65–99)

## 2017-09-27 LAB — BODY FLUID CELL COUNT WITH DIFFERENTIAL
Eos, Fluid: 0 %
LYMPHS FL: 21 %
Monocyte-Macrophage-Serous Fluid: 43 % — ABNORMAL LOW (ref 50–90)
Neutrophil Count, Fluid: 36 % — ABNORMAL HIGH (ref 0–25)
Total Nucleated Cell Count, Fluid: 484 cu mm (ref 0–1000)

## 2017-09-27 LAB — MAGNESIUM: Magnesium: 1.8 mg/dL (ref 1.7–2.4)

## 2017-09-27 MED ORDER — SODIUM CHLORIDE 0.9 % IV SOLN: Freq: Once | INTRAVENOUS | Status: DC

## 2017-09-27 MED ORDER — ALBUMIN HUMAN 25 % IV SOLN
INTRAVENOUS | Status: AC
  Administered 2017-09-27: 50 g via INTRAVENOUS
  Filled 2017-09-27: qty 200

## 2017-09-27 MED ORDER — TORSEMIDE 20 MG PO TABS
120.0000 mg | ORAL_TABLET | Freq: Two times a day (BID) | ORAL | Status: DC
  Administered 2017-09-27 – 2017-09-30 (×6): 120 mg via ORAL
  Filled 2017-09-27 (×6): qty 6

## 2017-09-27 NOTE — Consult Note (Signed)
Reason for Consult Referring Physician:   [name] is an 72 y.o. male.  HPI: Admitted yesterday with rectal bleeding. Describes bleeding as bright red. Had blood work drawn at [facility] yesterday. Hemoglobin 7.3. He was advised to go to the ED. States his stools are black. One BM since admission. Has received 2 units of PRBCs  Has not felt well in 4-5 days. SOB. Post transfusion hemoglobin 8.3.  Underwent and EGD 08/25/2017 which revealed :       The duodenal bulb and second portion of the duodenum were normal. Impression:               - Normal proximal esophagus and mid esophagus.                           - Grade I esophageal varices.                           - Z-line irregular, 45 cm from the incisors.                           - Portal hypertensive gastropathy.                           - A few gastric polyps documented to be                            hyperplastic polyps on EGD of April, 2019.                           - Normal duodenal bulb and second portion of the                            duodenum. Also underwent a Givens capsule in february which revealed chronic anemia most likely due to AVMs/portal hypertensive gastropathy.  NM GI bleeding scan was negative in February.  Hx of cirrhosis requiring frequent paracentesis. Last paracentesis 2/21/219. Scheduled today for paracentesis.     Past Medical History:  Diagnosis Date  . Aortic stenosis 03/08/2012   a.  s/p tissue AVR 10/13 with Dr. [name];   b. Echo 10/13: mod LVH, EF 55-60%, tissue AVR not well seen, no leak, gradient not too high (mean 34mHg), MAC, mild MR, mild LAE, PASP 38  . Ascites    status post paracentesis with removal of 3.4 L of ascitic fluid  . Atrial fibrillation ([place]    Permanent; off of Coumadin for now due to GI bleed  . AVM (arteriovenous malformation)    Recurrent GI bleeding requiring multiple transfusions  . CAD (coronary artery disease)    a. s/p CABG 2004;  b. [place]:   LHC 8/13: Free radial to obtuse marginal patent, SVG-diagonal patent, LIMA-LAD patent, EF 65-70%, mean aortic valve gradient 42  . Carotid bruit 06/14/2011   a. pre-AVR dopplers 10/13: no sig ICA stenosis  . Chronic diastolic heart failure ([place]   . Chronic kidney disease   . Cirrhosis ([place]   . COPD (chronic obstructive pulmonary disease) ([facility]   . Depression   . DM2 (diabetes mellitus, type 2) (HCC)    at least 10 yrs  . Dyspnea   . GERD (gastroesophageal  reflux disease)   . Gout   . H/O hiatal hernia   . Headache   . Hyperlipidemia   . Hyperlipidemia   . Hypertension    x 15 yrs  . Insomnia   . Iron deficiency anemia    Requiring intravenous iron  . Mediastinal adenopathy 09/22/2011  . OSA (obstructive sleep apnea) 1999    USES CPAP  . Osteoarthritis   . Overweight(278.02)   . Presence of permanent cardiac pacemaker   . Thrombocytopenia ([place])     Past Surgical History:  Procedure Laterality Date  . ABLATION  04-18-14   AVN ablation by Dr Caryl Comes  . AORTIC VALVE REPLACEMENT  04/25/2012   Procedure: AORTIC VALVE REPLACEMENT (AVR);  Surgeon: Melrose Nakayama, MD;  Location: [facility];  Service: Open Heart Surgery;  Laterality: N/A;  . AV NODE ABLATION N/A 04/18/2014   Procedure: AV NODE ABLATION;  Surgeon: Deboraha Sprang, MD;  Location: [facility] CATH LAB;  Service: Cardiovascular;  Laterality: N/A;  . CARPAL TUNNEL RELEASE    10/08/2003  . CATARACT EXTRACTION W/ INTRAOCULAR LENS IMPLANT Bilateral 09/28/2015 , 10/19/2015  . COLONOSCOPY WITH PROPOFOL N/A 10/28/2016   Procedure: COLONOSCOPY WITH PROPOFOL;  Surgeon: Rogene Houston, MD;  [facility];  Service: Endoscopy;  Laterality: N/A;  . CORONARY ANGIOPLASTY WITH STENT PLACEMENT  01/19/2005   drug eluting stent to high grade ostial stenosis of radial artery graft to OM  . CORONARY ARTERY BYPASS GRAFT   10/15/2002    [name]. [name], M.D.     . ESOPHAGOGASTRODUODENOSCOPY N/A 07/31/2013   Procedure:  ESOPHAGOGASTRODUODENOSCOPY (EGD);  Surgeon: Milus Banister, MD;  Location: [facility];  Service: Endoscopy;  Laterality: N/A;  . ESOPHAGOGASTRODUODENOSCOPY Left 01/11/2014   Procedure: ESOPHAGOGASTRODUODENOSCOPY (EGD);  Surgeon: Juanita Craver, MD;  Location: [facility] ENDOSCOPY;  Service: Endoscopy;  Laterality: Left;  . ESOPHAGOGASTRODUODENOSCOPY (EGD) WITH PROPOFOL N/A 10/28/2016   Procedure: ESOPHAGOGASTRODUODENOSCOPY (EGD) WITH PROPOFOL;  Surgeon: Rogene Houston, MD;  [facility];  Service: Endoscopy;  Laterality: N/A;  . ESOPHAGOGASTRODUODENOSCOPY (EGD) WITH PROPOFOL N/A 08/25/2017   Procedure: ESOPHAGOGASTRODUODENOSCOPY (EGD) WITH PROPOFOL;  Surgeon: Rogene Houston, MD;  [facility];  Service: Endoscopy;  Laterality: N/A;  . GIVENS CAPSULE STUDY N/A 08/26/2017   Procedure: GIVENS CAPSULE STUDY;  Surgeon: Danie Binder, MD;  [facility];  Service: Endoscopy;  Laterality: N/A;  . HERNIA REPAIR    . IR PARACENTESIS  09/14/2017  . KNEE ARTHROSCOPY Right 01/20/2016   Procedure: ARTHROSCOPY RIGHT KNEE WITH MENSICAL DEBRIDEMENT;  Surgeon: Gaynelle Arabian, MD;  [facility];  Service: Orthopedics;  Laterality: Right;  . LEFT AND RIGHT HEART CATHETERIZATION WITH CORONARY/GRAFT ANGIOGRAM N/A 03/07/2012   Procedure: LEFT AND RIGHT HEART CATHETERIZATION WITH Beatrix Fetters;  Surgeon: Sherren Mocha, MD;  Location: [facility] CATH LAB;  Service: Cardiovascular;  Laterality: N/A;  . lipoma surgery    . OTHER SURGICAL HISTORY  08/26/2011   [facility],  enteroscopy , revealing "three-four AVMs."   . PACEMAKER INSERTION  03-24-14   STJ Assurity single chamber pacemaker implanted by Dr Caryl Comes  . PARACENTESIS  12/2015  . PERMANENT PACEMAKER INSERTION N/A 03/24/2014   Procedure: PERMANENT PACEMAKER INSERTION;  Surgeon: Deboraha Sprang, MD;  Location: [facility] CATH LAB;  Service: Cardiovascular;  Laterality: N/A;  . POLYPECTOMY  10/28/2016   Procedure: POLYPECTOMY;  Surgeon: Rogene Houston, MD;   [facility];  Service: Endoscopy;;  gastric  . RIGHT HEART CATHETERIZATION N/A  01/01/2014   Procedure: RIGHT HEART CATH;  Surgeon: Jolaine Artist, MD;  Location: [facility] CATH LAB;  Service: Cardiovascular;  Laterality: N/A;  . TEE WITHOUT CARDIOVERSION  03/07/2012   Procedure: TRANSESOPHAGEAL ECHOCARDIOGRAM (TEE);  Surgeon: Thayer Headings, MD;  Location: [facility] ENDOSCOPY;  Service: Cardiovascular;  Laterality: N/A;    Family History  Problem Relation Age of Onset  . Hypertension Father   . Diabetes Father   . Heart disease Father   . COPD Sister   . Cancer Maternal Aunt        Breast cancer   . Cancer Maternal Aunt        Breast cancer   . Diabetes Son   . Cancer Daughter        Cervical cancer  . Coronary artery disease Unknown        FAMILY HISTORY    Social History:  reports that he quit smoking about 17 years ago. His smoking use included cigarettes. He has a 30.00 pack-year smoking history. he has never used smokeless tobacco. He reports that he does not drink alcohol or use drugs.  Allergies:  Allergies  Allergen Reactions  . Codeine Other (See Comments) and Palpitations    Hurting in chest  . Diltiazem Hcl Itching  . Niacin Other (See Comments)    Felt a "severe burning sensation" Hot flashes  . Aspirin     History of Bleeding ulcers   . Diltiazem Hcl Other (See Comments) and Hives    Gets hot    Medications: I have reviewed the patient's current medications.  Results for orders placed or performed during the hospital encounter of 09/26/17 (from the past 48 hour(s))  CBC     Status: Abnormal   Collection Time: 09/26/17 12:19 PM  Result Value Ref Range   WBC 8.9 4.0 - 10.5 K/uL   RBC 2.96 (L) 4.22 - 5.81 MIL/uL   Hemoglobin 7.3 (L) 13.0 - 17.0 g/dL   HCT 24.0 (L) 39.0 - 52.0 %   MCV 81.1 78.0 - 100.0 fL   MCH 24.7 (L) 26.0 - 34.0 pg   MCHC 30.4 30.0 - 36.0 g/dL   RDW 17.8 (H) 11.5 - 15.5 %   Platelets 165 150 - 400 K/uL    Comment: Performed at  [facility], [address] [place], [place] [address]  Comprehensive metabolic panel     Status: Abnormal   Collection Time: 09/26/17  2:00 PM  Result Value Ref Range   Sodium 124 (L) 135 - 145 mmol/L   Potassium 4.2 3.5 - 5.1 mmol/L   Chloride 89 (L) 101 - 111 mmol/L   CO2 21 (L) 22 - 32 mmol/L   Glucose, Bld 272 (H) 65 - 99 mg/dL   BUN 112 (H) 6 - 20 mg/dL    Comment: RESULTS CONFIRMED BY MANUAL DILUTION   Creatinine, Ser 2.65 (H) 0.61 - 1.24 mg/dL   Calcium 9.0 8.9 - 10.3 mg/dL   Total Protein 7.5 6.5 - 8.1 g/dL   Albumin 3.5 3.5 - 5.0 g/dL   AST 25 15 - 41 U/L   ALT 18 17 - 63 U/L   Alkaline Phosphatase 79 38 - 126 U/L   Total Bilirubin 0.6 0.3 - 1.2 mg/dL   GFR calc non Af Amer 23 (L) >60 mL/min   GFR calc Af Amer 27 (L) >60 mL/min    Comment: (NOTE) The eGFR has been calculated using the CKD EPI equation. This calculation has not been validated in  all clinical situations. eGFR's persistently <60 mL/min signify possible Chronic Kidney Disease.    Anion gap 14 5 - 15    Comment: Performed at [facility], [address], [place], [place] [address]  Type and screen [facility]     Status: None (Preliminary result)   Collection Time: 09/26/17  2:00 PM  Result Value Ref Range   ABO/RH(D) A POS    Antibody Screen POS    Sample Expiration 09/29/2017    Antibody Identification ANTI E    Unit Number [id number]    Blood Component Type RED CELLS,LR    Unit division 00    Status of Unit ISSUED    Donor AG Type NEGATIVE FOR KELL ANTIGEN NEGATIVE FOR E ANTIGEN    Transfusion Status OK TO TRANSFUSE    Crossmatch Result COMPATIBLE    Unit Number      [id number] Performed at [facility], [place] [address] [place], [place] [address]    Blood Component Type      RED CELLS,LR Performed at [facility] [address], [place], [place] [address]    Unit division      00 Performed at [facility], [place] [address], [place], [place] [address]     Status of Unit      [facility] Performed at [facility], [address], [place], [place] [address]    Donor AG Type NEGATIVE FOR KELL ANTIGEN NEGATIVE FOR E ANTIGEN    Transfusion Status OK TO TRANSFUSE    Crossmatch Result COMPATIBLE   Troponin I     Status: Abnormal   Collection Time: 09/26/17  2:00 PM  Result Value Ref Range   Troponin I 1.70 (HH) <0.03 ng/mL    Comment: CRITICAL RESULT CALLED TO, READ BACK BY AND VERIFIED WITH: KEMPT,C AT 1610 ON 3.5.2019 BY ISLEY,B Performed at [facility], [address] [place], [place] [address]   Prepare RBC     Status: None   Collection Time: 09/26/17  2:00 PM  Result Value Ref Range   Order Confirmation      ORDER PROCESSED BY BLOOD BANK Performed at [facility], [address], [place], [place] [address]   Troponin I (q 6hr x 3)     Status: Abnormal   Collection Time: 09/26/17  5:14 PM  Result Value Ref Range   Troponin I 1.37 (HH) <0.03 ng/mL    Comment: CRITICAL VALUE NOTED.  VALUE IS CONSISTENT WITH PREVIOUSLY REPORTED AND CALLED VALUE. Performed at [facility], [address], [place], [place] [address]   Glucose, capillary     Status: Abnormal   Collection Time: 09/26/17  6:29 PM  Result Value Ref Range   Glucose-Capillary 236 (H) 65 - 99 mg/dL   Comment 1 Notify RN   Glucose, capillary     Status: Abnormal   Collection Time: 09/26/17  7:50 PM  Result Value Ref Range   Glucose-Capillary 208 (H) 65 - 99 mg/dL  Glucose, capillary     Status: Abnormal   Collection Time: 09/27/17 12:06 AM  Result Value Ref Range   Glucose-Capillary 171 (H) 65 - 99 mg/dL  Troponin I (q 6hr x 3)     Status: Abnormal   Collection Time: 09/27/17 12:14 AM  Result Value Ref Range   Troponin I 0.77 (HH) <0.03 ng/mL    Comment: CRITICAL RESULT CALLED TO, READ BACK BY AND VERIFIED WITH: WAGONER,R @ 0105 ON 3.6.19 BY BOWMAN,L Performed at [facility], [address], [place], [place] [address]   Glucose, capillary     Status: Abnormal    Collection Time: 09/27/17  4:50 AM  Result Value Ref Range   Glucose-Capillary 131 (H) 65 - 99 mg/dL  CBC     Status: Abnormal   Collection Time: 09/27/17  6:41 AM  Result Value Ref Range   WBC 9.2 4.0 - 10.5 K/uL   RBC 3.21 (L) 4.22 - 5.81 MIL/uL   Hemoglobin 8.3 (L) 13.0 - 17.0 g/dL   HCT 26.3 (L) 39.0 - 52.0 %   MCV 81.9 78.0 - 100.0 fL   MCH 25.9 (L) 26.0 - 34.0 pg   MCHC 31.6 30.0 - 36.0 g/dL   RDW 16.8 (H) 11.5 - 15.5 %   Platelets 148 (L) 150 - 400 K/uL    Comment: Performed at [facility], [address], [place], [place] [address]  Basic metabolic panel     Status: Abnormal   Collection Time: 09/27/17  6:43 AM  Result Value Ref Range   Sodium 126 (L) 135 - 145 mmol/L   Potassium 3.7 3.5 - 5.1 mmol/L   Chloride 91 (L) 101 - 111 mmol/L   CO2 21 (L) 22 - 32 mmol/L   Glucose, Bld 146 (H) 65 - 99 mg/dL   BUN 108 (H) 6 - 20 mg/dL    Comment: RESULTS CONFIRMED BY MANUAL DILUTION   Creatinine, Ser 2.47 (H) 0.61 - 1.24 mg/dL   Calcium 9.1 8.9 - 10.3 mg/dL   GFR calc non Af Amer 25 (L) >60 mL/min   GFR calc Af Amer 29 (L) >60 mL/min    Comment: (NOTE) The eGFR has been calculated using the CKD EPI equation. This calculation has not been validated in all clinical situations. eGFR's persistently <60 mL/min signify possible Chronic Kidney Disease.    Anion gap 14 5 - 15    Comment: Performed at [facility], [address] [place], [place] [address]  Magnesium     Status: None   Collection Time: 09/27/17  6:43 AM  Result Value Ref Range   Magnesium 1.8 1.7 - 2.4 mg/dL    Comment: Performed at [facility], [address], [place], [place] [address]  Troponin I     Status: Abnormal   Collection Time: 09/27/17  6:43 AM  Result Value Ref Range   Troponin I 0.45 (HH) <0.03 ng/mL    Comment: CRITICAL VALUE NOTED.  VALUE IS CONSISTENT WITH PREVIOUSLY REPORTED AND CALLED VALUE. Performed at [facility], [address], [place], [place] [address]   Glucose, capillary     Status:  Abnormal   Collection Time: 09/27/17  7:19 AM  Result Value Ref Range   Glucose-Capillary 143 (H) 65 - 99 mg/dL    Dg Chest Port 1 View  Result Date: 09/26/2017 CLINICAL DATA:  One month history of rectal bleeding. Patient found to have a hemoglobin of 7.1. Patient reports generalized weakness. History of coronary artery disease, CHF, COPD, former smoker, cirrhosis, chronic renal insufficiency. EXAM: PORTABLE CHEST 1 VIEW COMPARISON:  PA and lateral chest x-ray of August 24, 2017 FINDINGS: The lungs are well-expanded. There is no focal infiltrate. The interstitial markings are mildly increased. The cardiac silhouette is enlarged and the pulmonary vascularity is mildly engorged. The patient has undergone previous median sternotomy. The ICD is in reasonable position where visualized. The observed bony thorax is unremarkable. IMPRESSION: CHF with mild interstitial edema superimposed upon COPD. There is no alveolar pneumonia. Electronically Signed  By: David  Martinique M.D.   On: 09/26/2017 16:14    ROS Blood pressure (!) 105/48, pulse 78, temperature 98 F (36.7 C), temperature source Oral, resp. rate 17, height _0  (1.727 m), weight 264 lb 5.3 oz (119.9 kg), SpO2 97 %. Physical Exam Patient examined in recliner. Alert and oriented. Skin warm and dry. Oral mucosa is moist.   . Sclera anicteric, conjunctivae is pink. Thyroid not enlarged. No cervical lymphadenopathy. Lungs clear. Heart regular rate and rhythm.  Abdomen is soft. Bowel sounds are positive. Abdomen is distended but not tense. No hepatomegaly. No abdominal masses felt. No tenderness.  No edema to lower extremities.  .   Assessment/Plan: GI bleed. Dr. Laural Golden is aware. Further recommendations to follow.  Sandie Swayze L Zac Torti 09/27/2017, 8:51 AM

## 2017-09-27 NOTE — Progress Notes (Addendum)
W [id number] [place] 2019 [id number]  Released Emergent and verified by 2 RNs. Computer error known.  Started at [place]

## 2017-09-27 NOTE — Progress Notes (Signed)
*  PRELIMINARY RESULTS* Echocardiogram 2D Echocardiogram has been performed.  Leavy Cella 09/27/2017, 2:46 PM

## 2017-09-27 NOTE — Progress Notes (Signed)
PROGRESS NOTE    [name]  RXV:[id number] DOB: August 29, 1945 DOA: 09/26/2017 PCP: [name], MD     Brief Narrative:  72 year old man admitted from home on 3/5 due to shortness of breath, orthopnea and fatigue.  He is also noticed black stools.  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  Started to have a hemoglobin of 7.3 upon arrival to the ED which was down from 8.6 a week ago, his troponin was also elevated to 1.70.  Admission has been requested.   Assessment & Plan:   Principal Problem:   Symptomatic anemia Active Problems:   CAD, ARTERY BYPASS GRAFT   Essential hypertension   Permanent atrial fibrillation (HCC)   CKD (chronic kidney disease), stage IV (HCC)   Atrioventricular block, complete (HCC)   Aortic stenosis, severe with S/P AVR    Cirrhosis of liver with ascites (HCC)   Chronic diastolic CHF (congestive heart failure) (HCC)   Hyponatremia   Gastrointestinal hemorrhage   Elevated troponin   COPD (chronic obstructive pulmonary disease) (HCC)   Symptomatic anemia/GI bleed -Presumably again bleeding from small bowel AVMs. -GI consultation is currently pending.  He was transfused 2 units of PRBCs in the ED, hemoglobin is up to 8.3.  Given this was not an appropriate response to 2 units and the fact that he is having active cardiac ischemia I will elect to transfuse another unit of PRBCs this morning.  Elevated troponin -Troponin peaked at 1.70, is down to 0.45 on most recent measurement. -EKG shows a ventricular paced rhythm, and as such is not interpretable for acute ischemic changes. -He denies chest pain, this likely reflects demand ischemia in the setting of acute anemia and chronic kidney disease. -Case was discussed with cardiology in the emergency department, plan to manage medically for now.  Chronic kidney disease stage IV -Serum creatinine remains around baseline of 2.4-2.6.  Hyponatremia -Chronic,  likely secondary to congestive heart failure and cirrhosis. -Sodium is currently 126, up from 124 on admission.  Insulin-dependent diabetes -A1c was only 5% last month, currently well controlled, continue current regimen.  Atrial fibrillation -Anticoagulation is precluded by GI bleed, continue metoprolol as tolerated for rate control.  Chronic diastolic heart failure -Echo from January 2018 shows an ejection fraction of 60-65% with grade 2 diastolic dysfunction, no wall motion abnormalities. -Appears compensated at present, and may be even a little hypovolemic. -Was placed on Lasix drip on admission, will discontinue. -Place on home torsemide dose, hold metolazone at present.   DVT prophylaxis: SCDs Code Status: Full code Family Communication: Daughter at bedside Disposition Plan: Anticipate another 1-2 days hospitalized while awaiting stabilization of cardiac enzymes and finalization of GI plan  Consultants:   GI  Procedures:   As above  Antimicrobials:  Anti-infectives (From admission, onward)   None       Subjective: Feels well, denies chest pain, shortness of breath, only complaint is he is hungry.  Objective: Vitals:   09/27/17 0500 09/27/17 0600 09/27/17 0724 09/27/17 0800  BP: (!) 110/59 (!) 89/71  (!) 105/48  Pulse: 69 75 74 78  Resp: 14 17 15 17   Temp:   98 F (36.7 C)   TempSrc:   Oral   SpO2: 98% 99% 96% 97%  Weight: 119.9 kg (264 lb 5.3 oz)     Height:        Intake/Output Summary (Last 24 hours) at 09/27/2017 1054 Last data filed at 09/27/2017 0035 Gross per 24  hour  Intake 745 ml  Output -  Net 745 ml   Filed Weights   09/26/17 1216 09/27/17 0500  Weight: 124.7 kg (275 lb) 119.9 kg (264 lb 5.3 oz)    Examination:  General exam: Alert, awake, oriented x 3 Respiratory system: Clear to auscultation. Respiratory effort normal. Cardiovascular system:RRR. No murmurs, rubs, gallops. Gastrointestinal system: Abdomen is nondistended, soft and  nontender. No organomegaly or masses felt. Normal bowel sounds heard. Central nervous system: Alert and oriented. No focal neurological deficits. Extremities: No C/C/E, +pedal pulses Skin: No rashes, lesions or ulcers Psychiatry: Judgement and insight appear normal. Mood & affect appropriate.     Data Reviewed: I have personally reviewed following labs and imaging studies  CBC: Recent Labs  Lab 09/20/17 1156 09/26/17 0927 09/26/17 1219 09/27/17 0641  WBC  --  8.7 8.9 9.2  HGB 8.6* 7.3* 7.3* 8.3*  HCT 28.6* 23.9* 24.0* 26.3*  MCV  --  81.0 81.1 81.9  PLT  --  161 165 630*   Basic Metabolic Panel: Recent Labs  Lab 09/26/17 0927 09/26/17 1400 09/27/17 0643  NA 127* 124* 126*  K 4.6 4.2 3.7  CL 93* 89* 91*  CO2 21* 21* 21*  GLUCOSE 237* 272* 146*  BUN 112* 112* 108*  CREATININE 2.69* 2.65* 2.47*  CALCIUM 8.9 9.0 9.1  MG  --   --  1.8   GFR: Estimated Creatinine Clearance: 35.5 mL/min (A) (by C-G formula based on SCr of 2.47 mg/dL (H)). Liver Function Tests: Recent Labs  Lab 09/26/17 1400  AST 25  ALT 18  ALKPHOS 79  BILITOT 0.6  PROT 7.5  ALBUMIN 3.5   No results for input(s): LIPASE, AMYLASE in the last 168 hours. No results for input(s): AMMONIA in the last 168 hours. Coagulation Profile: No results for input(s): INR, PROTIME in the last 168 hours. Cardiac Enzymes: Recent Labs  Lab 09/26/17 1400 09/26/17 1714 09/27/17 0014 09/27/17 0643  TROPONINI 1.70* 1.37* 0.77* 0.45*   BNP (last 3 results) No results for input(s): PROBNP in the last 8760 hours. HbA1C: No results for input(s): HGBA1C in the last 72 hours. CBG: Recent Labs  Lab 09/26/17 1829 09/26/17 1950 09/27/17 0006 09/27/17 0450 09/27/17 0719  GLUCAP 236* 208* 171* 131* 143*   Lipid Profile: No results for input(s): CHOL, HDL, LDLCALC, TRIG, CHOLHDL, LDLDIRECT in the last 72 hours. Thyroid Function Tests: No results for input(s): TSH, T4TOTAL, FREET4, T3FREE, THYROIDAB in the last  72 hours. Anemia Panel: No results for input(s): VITAMINB12, FOLATE, FERRITIN, TIBC, IRON, RETICCTPCT in the last 72 hours. Urine analysis:    Component Value Date/Time   COLORURINE YELLOW 08/25/2017 0842   APPEARANCEUR CLEAR 08/25/2017 0842   LABSPEC 1.009 08/25/2017 0842   PHURINE 5.0 08/25/2017 0842   GLUCOSEU NEGATIVE 08/25/2017 0842   HGBUR NEGATIVE 08/25/2017 0842   BILIRUBINUR NEGATIVE 08/25/2017 0842   KETONESUR NEGATIVE 08/25/2017 0842   PROTEINUR NEGATIVE 08/25/2017 0842   UROBILINOGEN 0.2 04/17/2012 1430   NITRITE NEGATIVE 08/25/2017 0842   LEUKOCYTESUR NEGATIVE 08/25/2017 0842   Sepsis Labs: @LABRCNTIP (procalcitonin:4,lacticidven:4)  )No results found for this or any previous visit (from the past 240 hour(s)).       Radiology Studies: Dg Chest Port 1 View  Result Date: 09/26/2017 CLINICAL DATA:  One month history of rectal bleeding. Patient found to have a hemoglobin of 7.1. Patient reports generalized weakness. History of coronary artery disease, CHF, COPD, former smoker, cirrhosis, chronic renal insufficiency. EXAM: PORTABLE CHEST 1 VIEW COMPARISON:  PA and lateral chest x-ray of August 24, 2017 FINDINGS: The lungs are well-expanded. There is no focal infiltrate. The interstitial markings are mildly increased. The cardiac silhouette is enlarged and the pulmonary vascularity is mildly engorged. The patient has undergone previous median sternotomy. The ICD is in reasonable position where visualized. The observed bony thorax is unremarkable. IMPRESSION: CHF with mild interstitial edema superimposed upon COPD. There is no alveolar pneumonia. Electronically Signed   By: David  Martinique M.D.   On: 09/26/2017 16:14        Scheduled Meds: . allopurinol  100 mg Oral Daily  . atorvastatin  20 mg Oral q1800  . gabapentin  300 mg Oral TID  . insulin aspart  0-15 Units Subcutaneous Q4H  . insulin detemir  30 Units Subcutaneous BID  . metoprolol succinate  25 mg Oral BID  .  pantoprazole (PROTONIX) IV  40 mg Intravenous Q12H  . sertraline  50 mg Oral Daily  . sodium chloride flush  3 mL Intravenous Q12H  . tamsulosin  0.4 mg Oral QPC supper   Continuous Infusions: . sodium chloride    . albumin human    . furosemide Stopped (09/27/17 0712)     LOS: 1 day    Time spent: 35 minutes. Greater than 50% of this time was spent in direct contact with the patient coordinating care.     Lelon Frohlich, MD [facility] Pager [phone]  If 7PM-7AM, please contact night-coverage [email or web address] Password [facility] 09/27/2017, 10:54 AM

## 2017-09-27 NOTE — Progress Notes (Signed)
Paracenteis complete no signs of distress 1.3L amber colored ascites removed.

## 2017-09-27 NOTE — Progress Notes (Signed)
CRITICAL VALUE ALERT  Critical Value:  Troponin 0.77  Date & Time Notied:  09/27/2017  0105  Troponin trending down

## 2017-09-27 NOTE — Procedures (Signed)
PreOperative Dx: Cirrhosis, ascites Postoperative Dx: Cirrhosis, ascites Procedure:   US guided paracentesis Radiologist:  Thornton Papas Anesthesia:  10 ml of1% lidocaine Specimen:  1.3 L of dark amber colored ascitic fluid EBL:   < 1 ml Complications: None

## 2017-09-28 LAB — BPAM RBC
BLOOD PRODUCT EXPIRATION DATE: 201904062359
Blood Product Expiration Date: 201903062359
Blood Product Expiration Date: 201903192359
ISSUE DATE / TIME: 201903052031
ISSUE DATE / TIME: 201903061158
ISSUE DATE / TIME: 201903061558
UNIT TYPE AND RH: 6200
UNIT TYPE AND RH: 6200
Unit Type and Rh: 6200

## 2017-09-28 LAB — TYPE AND SCREEN
ABO/RH(D): A POS
ANTIBODY SCREEN: POSITIVE
DONOR AG TYPE: NEGATIVE
Donor AG Type: NEGATIVE
Donor AG Type: NEGATIVE
UNIT DIVISION: 0
Unit division: 0
Unit division: 0

## 2017-09-28 LAB — GLUCOSE, CAPILLARY
GLUCOSE-CAPILLARY: 149 mg/dL — AB (ref 65–99)
GLUCOSE-CAPILLARY: 165 mg/dL — AB (ref 65–99)
GLUCOSE-CAPILLARY: 203 mg/dL — AB (ref 65–99)
GLUCOSE-CAPILLARY: 263 mg/dL — AB (ref 65–99)
GLUCOSE-CAPILLARY: 291 mg/dL — AB (ref 65–99)
Glucose-Capillary: 221 mg/dL — ABNORMAL HIGH (ref 65–99)
Glucose-Capillary: 305 mg/dL — ABNORMAL HIGH (ref 65–99)

## 2017-09-28 LAB — CBC
HEMATOCRIT: 28.3 % — AB (ref 39.0–52.0)
HEMOGLOBIN: 8.9 g/dL — AB (ref 13.0–17.0)
MCH: 26 pg (ref 26.0–34.0)
MCHC: 31.4 g/dL (ref 30.0–36.0)
MCV: 82.7 fL (ref 78.0–100.0)
Platelets: 158 10*3/uL (ref 150–400)
RBC: 3.42 MIL/uL — ABNORMAL LOW (ref 4.22–5.81)
RDW: 17.2 % — ABNORMAL HIGH (ref 11.5–15.5)
WBC: 9 10*3/uL (ref 4.0–10.5)

## 2017-09-28 LAB — BASIC METABOLIC PANEL
Anion gap: 14 (ref 5–15)
BUN: 103 mg/dL — ABNORMAL HIGH (ref 6–20)
CALCIUM: 9.3 mg/dL (ref 8.9–10.3)
CO2: 23 mmol/L (ref 22–32)
Chloride: 91 mmol/L — ABNORMAL LOW (ref 101–111)
Creatinine, Ser: 2.92 mg/dL — ABNORMAL HIGH (ref 0.61–1.24)
GFR, EST AFRICAN AMERICAN: 24 mL/min — AB (ref >60)
GFR, EST NON AFRICAN AMERICAN: 20 mL/min — AB (ref >60)
Glucose, Bld: 174 mg/dL — ABNORMAL HIGH (ref 65–99)
POTASSIUM: 4.2 mmol/L (ref 3.5–5.1)
SODIUM: 128 mmol/L — AB (ref 135–145)

## 2017-09-28 LAB — PATHOLOGIST SMEAR REVIEW

## 2017-09-28 LAB — TROPONIN I: TROPONIN I: 0.24 ng/mL — AB (ref <0.03)

## 2017-09-28 NOTE — Progress Notes (Signed)
CRITICAL VALUE ALERT  Critical Value:  0.24  Date & Time Notied:  09/28/17 0328  Provider Notified: 09/28/17 0334  Orders Received/Actions taken: No new orders obtained

## 2017-09-28 NOTE — Plan of Care (Signed)
progressing 

## 2017-09-28 NOTE — Progress Notes (Addendum)
PROGRESS NOTE    [name]  YBO:[id number] DOB: 1946-07-15 DOA: 09/26/2017 PCP: [name], MD     Brief Narrative:  72 year old man admitted from home on 3/5 due to shortness of breath, orthopnea and fatigue.  He is also noticed black stools.  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  Started to have a hemoglobin of 7.3 upon arrival to the ED which was down from 8.6 a week ago, his troponin was also elevated to 1.70.  Admission has been requested.   Assessment & Plan:   Principal Problem:   Symptomatic anemia Active Problems:   CAD, ARTERY BYPASS GRAFT   Essential hypertension   Permanent atrial fibrillation (HCC)   CKD (chronic kidney disease), stage IV (HCC)   Atrioventricular block, complete (HCC)   Aortic stenosis, severe with S/P AVR    Cirrhosis of liver with ascites (HCC)   Chronic diastolic CHF (congestive heart failure) (HCC)   Hyponatremia   Gastrointestinal hemorrhage   Elevated troponin   COPD (chronic obstructive pulmonary disease) (HCC)   Symptomatic anemia due to acute on chronic blood loss/GI bleed -Presumably again bleeding from small bowel AVMs. - He was transfused 2 units of PRBCs in the ED, hemoglobin is up to 8.3.  Given this was not an appropriate response to 2 units and the fact that he is having active cardiac ischemia I will elect to transfuse another unit of PRBCs this morning. -GI has arranged for transfer to [facility] pending bed availability for small bowel enteroscopy.  Elevated troponin -Troponin peaked at 1.70, is down to 0.24 on most recent measurement. -EKG shows a ventricular paced rhythm, and as such is not interpretable for acute ischemic changes. -He denies chest pain, this likely reflects demand ischemia in the setting of acute anemia and chronic kidney disease. -Case was discussed with cardiology in the emergency department, plan to manage medically for now.  Chronic kidney disease  stage IV -Serum creatinine remains around baseline of around 2.4-2.6.  Hyponatremia -Chronic, likely secondary to congestive heart failure and cirrhosis. -Sodium is currently 128, up from 124 on admission.  Insulin-dependent diabetes -A1c was only 5% last month, currently well controlled, continue current regimen.  Atrial fibrillation -Anticoagulation is precluded by GI bleed, continue metoprolol as tolerated for rate control.  Chronic diastolic heart failure -Echo from January 2018 shows an ejection fraction of 60-65% with grade 2 diastolic dysfunction, no wall motion abnormalities. -Appears compensated at present, and may be even a little hypovolemic. -Was placed on Lasix drip on admission, will discontinue. -Place on home torsemide dose, hold metolazone at present.   DVT prophylaxis: SCDs Code Status: Full code Family Communication: Daughter at bedside Disposition Plan: Awaiting transfer to [facility]. Consultants:   GI  Procedures:   As above  Antimicrobials:  Anti-infectives (From admission, onward)   None       Subjective: Sleepy, no complaints  Objective: Vitals:   09/27/17 1830 09/27/17 2006 09/27/17 2151 09/28/17 0500  BP: 112/77  (!) 113/44 (!) 100/53  Pulse: 90  75   Resp: 19  20   Temp: 98 F (36.7 C)  98.9 F (37.2 C) 97.8 F (36.6 C)  TempSrc: Oral  Oral Oral  SpO2: 100% 95%  94%  Weight:      Height:        Intake/Output Summary (Last 24 hours) at 09/28/2017 1413 Last data filed at 09/28/2017 0900 Gross per 24 hour  Intake 760 ml  Output  900 ml  Net -140 ml   Filed Weights   09/26/17 1216 09/27/17 0500  Weight: 124.7 kg (275 lb) 119.9 kg (264 lb 5.3 oz)    Examination:  General exam: Alert, awake, oriented x 3 Respiratory system: Clear to auscultation. Respiratory effort normal. Cardiovascular system:RRR. No murmurs, rubs, gallops. Gastrointestinal system: Abdomen is nondistended, soft and nontender. No organomegaly or masses felt.  Normal bowel sounds heard. Central nervous system: Alert and oriented. No focal neurological deficits. Extremities: No C/C/E, +pedal pulses Skin: No rashes, lesions or ulcers Psychiatry: Judgement and insight appear normal. Mood & affect appropriate.      Data Reviewed: I have personally reviewed following labs and imaging studies  CBC: Recent Labs  Lab 09/26/17 0927 09/26/17 1219 09/27/17 0641 09/27/17 2026 09/28/17 0223  WBC 8.7 8.9 9.2 8.9 9.0  HGB 7.3* 7.3* 8.3* 8.9* 8.9*  HCT 23.9* 24.0* 26.3* 28.4* 28.3*  MCV 81.0 81.1 81.9 83.0 82.7  PLT 161 165 148* 157 062   Basic Metabolic Panel: Recent Labs  Lab 09/26/17 0927 09/26/17 1400 09/27/17 0643 09/28/17 0223  NA 127* 124* 126* 128*  K 4.6 4.2 3.7 4.2  CL 93* 89* 91* 91*  CO2 21* 21* 21* 23  GLUCOSE 237* 272* 146* 174*  BUN 112* 112* 108* 103*  CREATININE 2.69* 2.65* 2.47* 2.92*  CALCIUM 8.9 9.0 9.1 9.3  MG  --   --  1.8  --    GFR: Estimated Creatinine Clearance: 30.1 mL/min (A) (by C-G formula based on SCr of 2.92 mg/dL (H)). Liver Function Tests: Recent Labs  Lab 09/26/17 1400  AST 25  ALT 18  ALKPHOS 79  BILITOT 0.6  PROT 7.5  ALBUMIN 3.5   No results for input(s): LIPASE, AMYLASE in the last 168 hours. No results for input(s): AMMONIA in the last 168 hours. Coagulation Profile: No results for input(s): INR, PROTIME in the last 168 hours. Cardiac Enzymes: Recent Labs  Lab 09/27/17 0014 09/27/17 0643 09/27/17 1227 09/27/17 2026 09/28/17 0223  TROPONINI 0.77* 0.45* 0.39* 0.23* 0.24*   BNP (last 3 results) No results for input(s): PROBNP in the last 8760 hours. HbA1C: No results for input(s): HGBA1C in the last 72 hours. CBG: Recent Labs  Lab 09/27/17 2148 09/27/17 2355 09/28/17 0428 09/28/17 0724 09/28/17 1113  GLUCAP 263* 203* 165* 149* 291*   Lipid Profile: No results for input(s): CHOL, HDL, LDLCALC, TRIG, CHOLHDL, LDLDIRECT in the last 72 hours. Thyroid Function Tests: No  results for input(s): TSH, T4TOTAL, FREET4, T3FREE, THYROIDAB in the last 72 hours. Anemia Panel: No results for input(s): VITAMINB12, FOLATE, FERRITIN, TIBC, IRON, RETICCTPCT in the last 72 hours. Urine analysis:    Component Value Date/Time   COLORURINE YELLOW 08/25/2017 0842   APPEARANCEUR CLEAR 08/25/2017 0842   LABSPEC 1.009 08/25/2017 0842   PHURINE 5.0 08/25/2017 0842   GLUCOSEU NEGATIVE 08/25/2017 0842   HGBUR NEGATIVE 08/25/2017 0842   BILIRUBINUR NEGATIVE 08/25/2017 0842   KETONESUR NEGATIVE 08/25/2017 0842   PROTEINUR NEGATIVE 08/25/2017 0842   UROBILINOGEN 0.2 04/17/2012 1430   NITRITE NEGATIVE 08/25/2017 0842   LEUKOCYTESUR NEGATIVE 08/25/2017 0842   Sepsis Labs: @LABRCNTIP (procalcitonin:4,lacticidven:4)  ) Recent Results (from the past 240 hour(s))  Culture, body fluid-bottle     Status: None (Preliminary result)   Collection Time: 09/27/17  3:35 PM  Result Value Ref Range Status   Specimen Description PERITONEAL  Final   Special Requests BOTTLES DRAWN AEROBIC AND ANAEROBIC 10CC  Final   Culture   Final  NO GROWTH < 24 HOURS Performed at [facility], [address] [place], [place] [address]    Report Status PENDING  Incomplete  Stat Gram stain     Status: None   Collection Time: 09/27/17  4:29 PM  Result Value Ref Range Status   Specimen Description PERITONEAL  Final   Special Requests NONE  Final   Gram Stain   Final    NO ORGANISMS SEEN CYTOSPIN SMEAR WBC PRESENT,BOTH PMN AND MONONUCLEAR PERFORMED AT [facility] Performed at [facility], [address] [place], [place] [address]    Report Status 09/27/2017 FINAL  Final         Radiology Studies: US Paracentesis  Result Date: 09/27/2017 INDICATION: Cirrhosis, recurrent ascites EXAM: ULTRASOUND GUIDED THERAPEUTIC PARACENTESIS MEDICATIONS: None. COMPLICATIONS: None immediate. PROCEDURE: Procedure, benefits, and risks of procedure were discussed with patient. Written informed consent for procedure was  obtained. Time out protocol followed. Adequate collection of ascites localized by ultrasound in RIGHT lower quadrant. Skin prepped and draped in usual sterile fashion. Skin and soft tissues anesthetized with 10 mL of 1% lidocaine. 5 [place] Yueh catheter placed into peritoneal cavity. 1.3 L of dark amber colored ascitic fluid aspirated by vacuum bottle suction. Procedure tolerated well by patient without immediate complication. FINDINGS: As above IMPRESSION: Successful ultrasound-guided paracentesis yielding 1.3 liters of peritoneal fluid. Electronically Signed   By: Lavonia Dana M.D.   On: 09/27/2017 13:32   Dg Chest Port 1 View  Result Date: 09/26/2017 CLINICAL DATA:  One month history of rectal bleeding. Patient found to have a hemoglobin of 7.1. Patient reports generalized weakness. History of coronary artery disease, CHF, COPD, former smoker, cirrhosis, chronic renal insufficiency. EXAM: PORTABLE CHEST 1 VIEW COMPARISON:  PA and lateral chest x-ray of August 24, 2017 FINDINGS: The lungs are well-expanded. There is no focal infiltrate. The interstitial markings are mildly increased. The cardiac silhouette is enlarged and the pulmonary vascularity is mildly engorged. The patient has undergone previous median sternotomy. The ICD is in reasonable position where visualized. The observed bony thorax is unremarkable. IMPRESSION: CHF with mild interstitial edema superimposed upon COPD. There is no alveolar pneumonia. Electronically Signed   By: David  Martinique M.D.   On: 09/26/2017 16:14        Scheduled Meds: . allopurinol  100 mg Oral Daily  . atorvastatin  20 mg Oral q1800  . gabapentin  300 mg Oral TID  . insulin aspart  0-15 Units Subcutaneous Q4H  . insulin detemir  30 Units Subcutaneous BID  . metoprolol succinate  25 mg Oral BID  . pantoprazole (PROTONIX) IV  40 mg Intravenous Q12H  . sertraline  50 mg Oral Daily  . sodium chloride flush  3 mL Intravenous Q12H  . tamsulosin  0.4 mg Oral QPC  supper  . torsemide  120 mg Oral BID   Continuous Infusions: . sodium chloride    . sodium chloride Stopped (09/27/17 1330)     LOS: 2 days    Time spent: 25 minutes. Greater than 50% of this time was spent in direct contact with the patient coordinating care.     Lelon Frohlich, MD [facility] Pager [phone]  If 7PM-7AM, please contact night-coverage [email or web address] Password [email or web address] 09/28/2017, 2:13 PM

## 2017-09-28 NOTE — Progress Notes (Signed)
Critical Troponin 0.24 received from lab. MD made aware, awaiting response. Patient verbally denies chest pain or acute respiratory distress. He is resting in bed right side lying with non labored respirations.

## 2017-09-29 LAB — BASIC METABOLIC PANEL
Anion gap: 16 — ABNORMAL HIGH (ref 5–15)
BUN: 96 mg/dL — ABNORMAL HIGH (ref 6–20)
CALCIUM: 8.9 mg/dL (ref 8.9–10.3)
CO2: 21 mmol/L — AB (ref 22–32)
CREATININE: 2.92 mg/dL — AB (ref 0.61–1.24)
Chloride: 90 mmol/L — ABNORMAL LOW (ref 101–111)
GFR calc Af Amer: 24 mL/min — ABNORMAL LOW (ref >60)
GFR, EST NON AFRICAN AMERICAN: 20 mL/min — AB (ref >60)
Glucose, Bld: 119 mg/dL — ABNORMAL HIGH (ref 65–99)
Potassium: 3.7 mmol/L (ref 3.5–5.1)
SODIUM: 127 mmol/L — AB (ref 135–145)

## 2017-09-29 LAB — GLUCOSE, CAPILLARY
GLUCOSE-CAPILLARY: 124 mg/dL — AB (ref 65–99)
GLUCOSE-CAPILLARY: 338 mg/dL — AB (ref 65–99)
Glucose-Capillary: 132 mg/dL — ABNORMAL HIGH (ref 65–99)
Glucose-Capillary: 219 mg/dL — ABNORMAL HIGH (ref 65–99)
Glucose-Capillary: 221 mg/dL — ABNORMAL HIGH (ref 65–99)
Glucose-Capillary: 329 mg/dL — ABNORMAL HIGH (ref 65–99)

## 2017-09-29 LAB — CBC
HCT: 28.7 % — ABNORMAL LOW (ref 39.0–52.0)
Hemoglobin: 8.9 g/dL — ABNORMAL LOW (ref 13.0–17.0)
MCH: 25.6 pg — ABNORMAL LOW (ref 26.0–34.0)
MCHC: 31 g/dL (ref 30.0–36.0)
MCV: 82.5 fL (ref 78.0–100.0)
PLATELETS: 168 10*3/uL (ref 150–400)
RBC: 3.48 MIL/uL — ABNORMAL LOW (ref 4.22–5.81)
RDW: 17.5 % — AB (ref 11.5–15.5)
WBC: 10.4 10*3/uL (ref 4.0–10.5)

## 2017-09-29 MED ORDER — METHYLPREDNISOLONE SODIUM SUCC 125 MG IJ SOLR
60.0000 mg | Freq: Once | INTRAMUSCULAR | Status: AC
  Administered 2017-09-29: 60 mg via INTRAVENOUS
  Filled 2017-09-29: qty 2

## 2017-09-29 MED ORDER — SODIUM CHLORIDE 0.9 % IV BOLUS (SEPSIS)
500.0000 mL | Freq: Once | INTRAVENOUS | Status: AC
  Administered 2017-09-29: 500 mL via INTRAVENOUS

## 2017-09-29 NOTE — Progress Notes (Signed)
  Subjective:  Patient feels fine now.  He states early this morning he felt weak and his blood pressure was low and he was given bolus of sodium chloride.  He denies nausea vomiting or abdominal pain.  He has not had a BM today.   Objective: Blood pressure (!) 115/59, pulse 69, temperature 98.3 F (36.8 C), temperature source Oral, resp. rate 17, height 5\' 8"  (1.727 m), weight 265 lb 6.9 oz (120.4 kg), SpO2 97 %. Patient is alert and in no acute distress. Distended abdomen without tenderness.  Ascites is not tense. No LE edema or clubbing noted.  Labs/studies Results:  Recent Labs    October 14, 2017 2026 09/28/17 0223 09/29/17 0438  WBC 8.9 9.0 10.4  HGB 8.9* 8.9* 8.9*  HCT 28.4* 28.3* 28.7*  PLT 157 158 168    BMET  Recent Labs    10-14-17 0643 09/28/17 0223 09/29/17 0438  NA 126* 128* 127*  K 3.7 4.2 3.7  CL 91* 91* 90*  CO2 21* 23 21*  GLUCOSE 146* 174* 119*  BUN 108* 103* 96*  CREATININE 2.47* 2.92* 2.92*  CALCIUM 9.1 9.3 8.9      Assessment:  #1.  Anemia secondary to recurrent GI bleed possibly from small bowel angiodysplasia.  He has received 3 units of PRBCs during this admission.  No change in hemoglobin over the last 24 hours.  #2.  Cirrhotic ascites.  He had abdominal paracenteses 2 days ago.  Ascites is non-tense.  #3.  Hyponatremia most likely secondary to diuretic therapy.  #4.  Chronic kidney disease.   Recommendations:  Patient is waiting for a bed at [facility]. He will need small bowel endoscopy. CBC in a.m.

## 2017-09-29 NOTE — Progress Notes (Signed)
Pt states he feels a little "weird" and family member says he is clammy and such. Upon assessment patient BP was low on both automatic readings, on manual attempt, still low but better than it was. Pt does seem a little diaphoretic. No new pain besides what is in his feet. MD notified. Will continue to monitor.

## 2017-09-29 NOTE — Progress Notes (Signed)
PROGRESS NOTE    [name]  VEL:[id number] DOB: 07-Dec-1945 DOA: 09/26/2017 PCP: [name], MD     Brief Narrative:  72 year old man admitted from home on 3/5 due to shortness of breath, orthopnea and fatigue.  He is also noticed black stools.  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  Started to have a hemoglobin of 7.3 upon arrival to the ED which was down from 8.6 a week ago, his troponin was also elevated to 1.70.  Admission has been requested.   Assessment & Plan:   Principal Problem:   Symptomatic anemia Active Problems:   CAD, ARTERY BYPASS GRAFT   Essential hypertension   Permanent atrial fibrillation (HCC)   CKD (chronic kidney disease), stage IV (HCC)   Atrioventricular block, complete (HCC)   Aortic stenosis, severe with S/P AVR    Cirrhosis of liver with ascites (HCC)   Chronic diastolic CHF (congestive heart failure) (HCC)   Hyponatremia   Gastrointestinal hemorrhage   Elevated troponin   COPD (chronic obstructive pulmonary disease) (HCC)   Symptomatic anemia due to acute on chronic blood loss/GI bleed -Presumably again bleeding from small bowel AVMs. -GI has arranged for transfer to [facility] pending bed availability for small bowel enteroscopy. -He has been transfused a total of 3 units of PRBCs with hemoglobin that has remained stable at around 8.9.  Elevated troponin -Troponin peaked at 1.70, is down to 0.24 on most recent measurement. -EKG shows a ventricular paced rhythm, and as such is not interpretable for acute ischemic changes. -He denies chest pain, this likely reflects demand ischemia in the setting of acute anemia and chronic kidney disease. -Case was discussed with cardiology in the emergency department, plan to manage medically for now.  Chronic kidney disease stage IV -Serum creatinine remains around baseline of around 2.4-2.6.  Hyponatremia -Chronic, likely secondary to congestive heart  failure and cirrhosis. -Sodium is currently 127, up from 124 on admission.  Insulin-dependent diabetes -A1c was only 5% last month, currently well controlled, continue current regimen.  Atrial fibrillation -Anticoagulation is precluded by GI bleed, continue metoprolol as tolerated for rate control.  Chronic diastolic heart failure -Echo from January 2018 shows an ejection fraction of 60-65% with grade 2 diastolic dysfunction, no wall motion abnormalities. -Appears compensated at present, and may be even a little hypovolemic. -Was placed on Lasix drip on admission, will discontinue. -Place on home torsemide dose, hold metolazone at present.  Transient hypotension -Patient felt little dizzy earlier today (3/8) systolic blood pressure was in the 90s, have given him a 500 cc bolus with results of blood pressure in the 120s, dizziness has resolved.   DVT prophylaxis: SCDs Code Status: Full code Family Communication: Daughter at bedside Disposition Plan: Awaiting transfer to [facility]. Consultants:   GI  Procedures:   As above  Antimicrobials:  Anti-infectives (From admission, onward)   None       Subjective:  I saw him earlier in the day, he was sleepy but easily arousable, had no complaints.  Objective: Vitals:   09/29/17 1035 09/29/17 1039 09/29/17 1234 09/29/17 1300  BP: (!) 78/34 (!) 98/50 120/60 (!) 115/59  Pulse:    69  Resp:    17  Temp:    98.3 F (36.8 C)  TempSrc:    Oral  SpO2:      Weight:      Height:        Intake/Output Summary (Last 24 hours) at 09/29/2017 1532  Last data filed at 09/29/2017 1234 Gross per 24 hour  Intake 600 ml  Output 2050 ml  Net -1450 ml   Filed Weights   09/26/17 1216 09/27/17 0500 09/29/17 0500  Weight: 124.7 kg (275 lb) 119.9 kg (264 lb 5.3 oz) 120.4 kg (265 lb 6.9 oz)    Examination:  General exam: Alert, awake, oriented x 3 Respiratory system: Clear to auscultation. Respiratory effort normal. Cardiovascular  system:RRR. No murmurs, rubs, gallops. Gastrointestinal system: Abdomen is nondistended, soft and nontender. No organomegaly or masses felt. Normal bowel sounds heard. Central nervous system: Alert and oriented. No focal neurological deficits. Extremities: 1+ edema bilaterally, +pedal pulses Skin: No rashes, lesions or ulcers Psychiatry: Judgement and insight appear normal. Mood & affect appropriate.        Data Reviewed: I have personally reviewed following labs and imaging studies  CBC: Recent Labs  Lab 09/26/17 1219 09/27/17 0641 09/27/17 2026 09/28/17 0223 09/29/17 0438  WBC 8.9 9.2 8.9 9.0 10.4  HGB 7.3* 8.3* 8.9* 8.9* 8.9*  HCT 24.0* 26.3* 28.4* 28.3* 28.7*  MCV 81.1 81.9 83.0 82.7 82.5  PLT 165 148* 157 158 532   Basic Metabolic Panel: Recent Labs  Lab 09/26/17 0927 09/26/17 1400 09/27/17 0643 09/28/17 0223 09/29/17 0438  NA 127* 124* 126* 128* 127*  K 4.6 4.2 3.7 4.2 3.7  CL 93* 89* 91* 91* 90*  CO2 21* 21* 21* 23 21*  GLUCOSE 237* 272* 146* 174* 119*  BUN 112* 112* 108* 103* 96*  CREATININE 2.69* 2.65* 2.47* 2.92* 2.92*  CALCIUM 8.9 9.0 9.1 9.3 8.9  MG  --   --  1.8  --   --    GFR: Estimated Creatinine Clearance: 30.1 mL/min (A) (by C-G formula based on SCr of 2.92 mg/dL (H)). Liver Function Tests: Recent Labs  Lab 09/26/17 1400  AST 25  ALT 18  ALKPHOS 79  BILITOT 0.6  PROT 7.5  ALBUMIN 3.5   No results for input(s): LIPASE, AMYLASE in the last 168 hours. No results for input(s): AMMONIA in the last 168 hours. Coagulation Profile: No results for input(s): INR, PROTIME in the last 168 hours. Cardiac Enzymes: Recent Labs  Lab 09/27/17 0014 09/27/17 0643 09/27/17 1227 09/27/17 2026 09/28/17 0223  TROPONINI 0.77* 0.45* 0.39* 0.23* 0.24*   BNP (last 3 results) No results for input(s): PROBNP in the last 8760 hours. HbA1C: No results for input(s): HGBA1C in the last 72 hours. CBG: Recent Labs  Lab 09/28/17 2025 09/28/17 2345  09/29/17 0429 09/29/17 0815 09/29/17 1139  GLUCAP 305* 221* 124* 132* 219*   Lipid Profile: No results for input(s): CHOL, HDL, LDLCALC, TRIG, CHOLHDL, LDLDIRECT in the last 72 hours. Thyroid Function Tests: No results for input(s): TSH, T4TOTAL, FREET4, T3FREE, THYROIDAB in the last 72 hours. Anemia Panel: No results for input(s): VITAMINB12, FOLATE, FERRITIN, TIBC, IRON, RETICCTPCT in the last 72 hours. Urine analysis:    Component Value Date/Time   COLORURINE YELLOW 08/25/2017 [place] 08/25/2017 0842   LABSPEC 1.009 08/25/2017 0842   PHURINE 5.0 08/25/2017 0842   GLUCOSEU NEGATIVE 08/25/2017 0842   HGBUR NEGATIVE 08/25/2017 0842   BILIRUBINUR NEGATIVE 08/25/2017 0842   KETONESUR NEGATIVE 08/25/2017 0842   PROTEINUR NEGATIVE 08/25/2017 0842   UROBILINOGEN 0.2 04/17/2012 1430   NITRITE NEGATIVE 08/25/2017 0842   LEUKOCYTESUR NEGATIVE 08/25/2017 0842   Sepsis Labs: @LABRCNTIP (procalcitonin:4,lacticidven:4)  ) Recent Results (from the past 240 hour(s))  Culture, body fluid-bottle     Status: None (Preliminary  result)   Collection Time: 09/27/17  3:35 PM  Result Value Ref Range Status   Specimen Description PERITONEAL  Final   Special Requests BOTTLES DRAWN AEROBIC AND ANAEROBIC 10CC  Final   Culture   Final    NO GROWTH 2 DAYS Performed at [facility], [address], [place], [place] [address]    Report Status PENDING  Incomplete  Stat Gram stain     Status: None   Collection Time: 09/27/17  4:29 PM  Result Value Ref Range Status   Specimen Description PERITONEAL  Final   Special Requests NONE  Final   Gram Stain   Final    NO ORGANISMS SEEN CYTOSPIN SMEAR WBC PRESENT,BOTH PMN AND MONONUCLEAR PERFORMED AT [facility] Performed at [facility], [address] [place], [place] [address]    Report Status 09/27/2017 FINAL  Final         Radiology Studies: No results found.      Scheduled Meds: . allopurinol  100 mg Oral Daily  .  atorvastatin  20 mg Oral q1800  . gabapentin  300 mg Oral TID  . insulin aspart  0-15 Units Subcutaneous Q4H  . insulin detemir  30 Units Subcutaneous BID  . metoprolol succinate  25 mg Oral BID  . pantoprazole (PROTONIX) IV  40 mg Intravenous Q12H  . sertraline  50 mg Oral Daily  . sodium chloride flush  3 mL Intravenous Q12H  . tamsulosin  0.4 mg Oral QPC supper  . torsemide  120 mg Oral BID   Continuous Infusions: . sodium chloride    . sodium chloride Stopped (09/27/17 1330)     LOS: 3 days    Time spent: 25 minutes. Greater than 50% of this time was spent in direct contact with the patient coordinating care.     Lelon Frohlich, MD [facility] Pager [phone]  If 7PM-7AM, please contact night-coverage [email or web address] Password [facility] 09/29/2017, 3:32 PM

## 2017-09-30 LAB — BASIC METABOLIC PANEL
Anion gap: 15 (ref 5–15)
BUN: 93 mg/dL — ABNORMAL HIGH (ref 6–20)
CALCIUM: 8.3 mg/dL — AB (ref 8.9–10.3)
CO2: 19 mmol/L — AB (ref 22–32)
CREATININE: 2.55 mg/dL — AB (ref 0.61–1.24)
Chloride: 88 mmol/L — ABNORMAL LOW (ref 101–111)
GFR calc Af Amer: 28 mL/min — ABNORMAL LOW (ref >60)
GFR calc non Af Amer: 24 mL/min — ABNORMAL LOW (ref >60)
GLUCOSE: 481 mg/dL — AB (ref 65–99)
Potassium: 3.8 mmol/L (ref 3.5–5.1)
Sodium: 122 mmol/L — ABNORMAL LOW (ref 135–145)

## 2017-09-30 LAB — CBC
HCT: 28.5 % — ABNORMAL LOW (ref 39.0–52.0)
Hemoglobin: 8.9 g/dL — ABNORMAL LOW (ref 13.0–17.0)
MCH: 25.9 pg — AB (ref 26.0–34.0)
MCHC: 31.2 g/dL (ref 30.0–36.0)
MCV: 82.8 fL (ref 78.0–100.0)
PLATELETS: 172 10*3/uL (ref 150–400)
RBC: 3.44 MIL/uL — ABNORMAL LOW (ref 4.22–5.81)
RDW: 17.8 % — AB (ref 11.5–15.5)
WBC: 8.9 10*3/uL (ref 4.0–10.5)

## 2017-09-30 LAB — GLUCOSE, CAPILLARY
GLUCOSE-CAPILLARY: 427 mg/dL — AB (ref 65–99)
GLUCOSE-CAPILLARY: 406 mg/dL — AB (ref 65–99)
Glucose-Capillary: 322 mg/dL — ABNORMAL HIGH (ref 65–99)
Glucose-Capillary: 513 mg/dL (ref 65–99)
Glucose-Capillary: 435 mg/dL — ABNORMAL HIGH (ref 65–99)

## 2017-09-30 MED ORDER — INSULIN ASPART 100 UNIT/ML ~~LOC~~ SOLN
25.0000 [IU] | Freq: Once | SUBCUTANEOUS | Status: AC
  Administered 2017-09-30: 25 [IU] via SUBCUTANEOUS

## 2017-09-30 MED ORDER — PANTOPRAZOLE SODIUM 40 MG PO TBEC: 40.0000 mg | DELAYED_RELEASE_TABLET | Freq: Every day | ORAL | 1 refills | Status: DC

## 2017-09-30 MED ORDER — INSULIN ASPART 100 UNIT/ML ~~LOC~~ SOLN
20.0000 [IU] | Freq: Once | SUBCUTANEOUS | Status: AC
  Administered 2017-09-30: 20 [IU] via SUBCUTANEOUS

## 2017-09-30 NOTE — Progress Notes (Signed)
Patient's glucose 406.  Dr. Jerilee Hoh notified via text page.

## 2017-09-30 NOTE — Progress Notes (Signed)
AVS reviewed with patient and patient's wife.  Both verbalized understanding of discharge instructions, physician follow-up, medications and checking blood sugar tonight.  IV removed.  Site WNL.  Patient stable awaiting transport home.

## 2017-09-30 NOTE — Progress Notes (Signed)
Patient's blood glucose 435.  Dr. Jerilee Hoh notified.  Dr. Jerilee Hoh called and gave order for patient to receive 20 units of Novolog and then patient may discharged home.  Patient is to check blood glucose at home closely per Dr. Jerilee Hoh.  Patient and patient's wife notified and agreed.

## 2017-09-30 NOTE — Discharge Summary (Signed)
Physician Discharge Summary  [name]:[id number] DOB: Dec 02, 1945 DOA: 09/26/2017  PCP: [name], MD  Admit date: 09/26/2017 Discharge date: 09/30/2017  Time spent: 45 minutes  Recommendations for Outpatient Follow-up:  -Will be discharged home today, attempts were made to transfer patient to [facility] per Dr. Olevia Perches recommendation for small bowel enteroscopy, however patient has been waiting for a bed for over 3 days and will be discharged today with plans for outpatient follow-up with the [facility] GI department.  Discharge Diagnoses:  Principal Problem:   Symptomatic anemia Active Problems:   CAD, ARTERY BYPASS GRAFT   Essential hypertension   Permanent atrial fibrillation (HCC)   CKD (chronic kidney disease), stage IV (HCC)   Atrioventricular block, complete (HCC)   Aortic stenosis, severe with S/P AVR    Cirrhosis of liver with ascites (HCC)   Chronic diastolic CHF (congestive heart failure) (HCC)   Hyponatremia   Gastrointestinal hemorrhage   Elevated troponin   COPD (chronic obstructive pulmonary disease) (HCC)   Discharge Condition: Stable and improved  Filed Weights   09/27/17 0500 09/29/17 0500 09/30/17 0610  Weight: 119.9 kg (264 lb 5.3 oz) 120.4 kg (265 lb 6.9 oz) 122.6 kg (270 lb 4.8 oz)    History of present illness:  As per Dr. Myna Hidalgo on 3/5: [name] is a 72 y.o. male with medical history significant for coronary artery disease status post CABG, aortic stenosis status post AVR, chronic kidney disease stage IV, cirrhosis with ascites, atrial fibrillation not on anticoagulation, chronic diastolic CHF, insulin-dependent diabetes mellitus, and chronic GI blood loss, now presenting to the emergency department for evaluation of progressive dyspnea, orthopnea, and fatigue.  He had outpatient blood work concerning for low hemoglobin and was directed to the ED.  He had been admitted twice last month under similar circumstances and receive transfusions.   Bleeding was felt most likely secondary to small bowel AVMs.  He denies chest pain.  Denies fevers or chills.  Denies abdominal pain or hematemesis.  ED Course: Upon arrival to the ED, patient is found to be afebrile, saturating well on room air, blood pressure 100/50, and vitals otherwise normal.  EKG features a paced rhythm and chest x-ray is notable for CHF with interstitial edema.  Chemistry panel reveals a creatinine of 2.65, similar to priors, and a chronic hyponatremia with sodium 124.  CBC is notable for hemoglobin of 7.3, down from 8.6 a week ago.  Troponin is elevated at 1.70.  2 units of packed red blood cells were ordered for immediate transfusion from the ED.  ED physician discussed the case with cardiology and recommendations for medical admission and medical management were given by phone.  Patient remains hemodynamically stable and will be admitted to the stepdown unit for ongoing evaluation and management of dyspnea with orthopnea suspected multifactorial with contributions from acute on chronic anemia, acute on chronic diastolic CHF, and marked abdominal ascites.    Hospital Course:   Symptomatic anemia due to acute on chronic blood loss/GI bleed -Presumably again bleeding from small bowel AVMs. -GI has arranged for transfer to [facility] for small bowel enteroscopy, however this never happened due to lack of bed availability.. -He has been transfused a total of 3 units of PRBCs with hemoglobin that has remained stable at around 8.9 for 72 hours.  Elevated troponin -Troponin peaked at 1.70, is down to 0.24 on most recent measurement. -EKG shows a ventricular paced rhythm, and as such is not interpretable for acute ischemic changes. -  He denies chest pain, this likely reflects demand ischemia in the setting of acute anemia and chronic kidney disease. -Case was discussed with cardiology in the emergency department, plan to manage medically for now.  Chronic kidney disease stage  IV -Serum creatinine remains around baseline of around 2.4-2.6.  Hyponatremia -Chronic, likely secondary to congestive heart failure and cirrhosis. -Sodium is currently 127, up from 124 on admission.  Insulin-dependent diabetes -A1c was only 5% last month, currently well controlled, continue current regimen.  Atrial fibrillation -Anticoagulation is precluded by GI bleed, continue metoprolol as tolerated for rate control.  Chronic diastolic heart failure -Echo from January 2018 shows an ejection fraction of 60-65% with grade 2 diastolic dysfunction, no wall motion abnormalities. -Appears compensated at present, and may be even a little hypovolemic. -Continue torsemide.    Procedures:  As above   Consultations:  GI, Dr. Laural Golden  Discharge Instructions  Discharge Instructions    Diet - low sodium heart healthy   Complete by:  As directed    Increase activity slowly   Complete by:  As directed      Allergies as of 09/30/2017      Reactions   Codeine Other (See Comments), Palpitations   Hurting in chest   Diltiazem Hcl Itching   Niacin Other (See Comments)   Felt a "severe burning sensation" Hot flashes   Aspirin    History of Bleeding ulcers    Diltiazem Hcl Other (See Comments), Hives   Gets hot      Medication List    STOP taking these medications   metolazone 5 MG tablet Commonly known as:  ZAROXOLYN   spironolactone 50 MG tablet Commonly known as:  ALDACTONE     TAKE these medications   acetaminophen 500 MG tablet Commonly known as:  TYLENOL Take 1,000 mg by mouth at bedtime as needed for mild pain or headache.   ALLERGY PO Take 1 tablet by mouth daily.   allopurinol 100 MG tablet Commonly known as:  ZYLOPRIM Take 1 tablet (100 mg total) by mouth daily.   atorvastatin 20 MG tablet Commonly known as:  LIPITOR Take 20 mg by mouth daily at 6 PM.   [employer] SPECTRAVITE ADULT 50+ PO Take 1 tablet by mouth daily.   gabapentin 300 MG  capsule Commonly known as:  NEURONTIN Take 300 mg by mouth 3 (three) times daily.   insulin NPH Human 100 UNIT/ML injection Commonly known as:  HUMULIN N,NOVOLIN N Inject 65 Units into the skin 2 (two) times daily before a meal.   insulin regular 100 units/mL injection Commonly known as:  NOVOLIN R,HUMULIN R Inject 30 Units into the skin 2 (two) times daily before a meal.   metoprolol succinate 25 MG 24 hr tablet Commonly known as:  TOPROL-XL Take 1 tablet (25 mg total) by mouth 2 (two) times daily.   pantoprazole 40 MG tablet Commonly known as:  PROTONIX Take 1 tablet (40 mg total) by mouth 2 (two) times daily before a meal. What changed:  Another medication with the same name was added. Make sure you understand how and when to take each.   pantoprazole 40 MG tablet Commonly known as:  PROTONIX Take 1 tablet (40 mg total) by mouth daily. What changed:  You were already taking a medication with the same name, and this prescription was added. Make sure you understand how and when to take each.   potassium chloride SA 20 MEQ tablet Commonly known as:  K-DUR,KLOR-CON Take 2 tablets (  40 mEq total) by mouth 2 (two) times daily. What changed:    when to take this  additional instructions   PROAIR HFA 108 (90 Base) MCG/ACT inhaler Generic drug:  albuterol Take 2 puffs by mouth 2 (two) times daily.   sertraline 50 MG tablet Commonly known as:  ZOLOFT Take 1 tablet (50 mg total) by mouth daily.   tamsulosin 0.4 MG Caps capsule Commonly known as:  FLOMAX TAKE ONE CAPSULE BY MOUTH EVERY DAY AFTER SUPPER   torsemide 20 MG tablet Commonly known as:  DEMADEX Take 6 tablets (120 mg total) by mouth 2 (two) times daily.   vitamin C 500 MG tablet Commonly known as:  ASCORBIC ACID Take 500 mg by mouth 2 (two) times daily.      Allergies  Allergen Reactions  . Codeine Other (See Comments) and Palpitations    Hurting in chest  . Diltiazem Hcl Itching  . Niacin Other (See  Comments)    Felt a "severe burning sensation" Hot flashes  . Aspirin     History of Bleeding ulcers   . Diltiazem Hcl Other (See Comments) and Hives    Gets hot      The results of significant diagnostics from this hospitalization (including imaging, microbiology, ancillary and laboratory) are listed below for reference.    Significant Diagnostic Studies: Nm Gi Blood Loss  Result Date: 09/08/2017 CLINICAL DATA:  GI bleed.  Melena. EXAM: NUCLEAR MEDICINE GASTROINTESTINAL BLEEDING SCAN TECHNIQUE: Sequential abdominal images were obtained following intravenous administration of Tc-68m labeled red blood cells. RADIOPHARMACEUTICALS:  24 mCi Tc-28m pertechnetate in-vitro labeled red cells. COMPARISON:  None FINDINGS: There is no evidence of active bleeding during the examination, carried out for 1 hour. IMPRESSION: Negative GI bleeding study. Electronically Signed   By: Lorriane Shire M.D.   On: 09/08/2017 16:17   US Paracentesis  Result Date: 09/27/2017 INDICATION: Cirrhosis, recurrent ascites EXAM: ULTRASOUND GUIDED THERAPEUTIC PARACENTESIS MEDICATIONS: None. COMPLICATIONS: None immediate. PROCEDURE: Procedure, benefits, and risks of procedure were discussed with patient. Written informed consent for procedure was obtained. Time out protocol followed. Adequate collection of ascites localized by ultrasound in RIGHT lower quadrant. Skin prepped and draped in usual sterile fashion. Skin and soft tissues anesthetized with 10 mL of 1% lidocaine. 5 [place] Yueh catheter placed into peritoneal cavity. 1.3 L of dark amber colored ascitic fluid aspirated by vacuum bottle suction. Procedure tolerated well by patient without immediate complication. FINDINGS: As above IMPRESSION: Successful ultrasound-guided paracentesis yielding 1.3 liters of peritoneal fluid. Electronically Signed   By: Lavonia Dana M.D.   On: 09/27/2017 13:32   Dg Chest Port 1 View  Result Date: 09/26/2017 CLINICAL DATA:  One month history  of rectal bleeding. Patient found to have a hemoglobin of 7.1. Patient reports generalized weakness. History of coronary artery disease, CHF, COPD, former smoker, cirrhosis, chronic renal insufficiency. EXAM: PORTABLE CHEST 1 VIEW COMPARISON:  PA and lateral chest x-ray of August 24, 2017 FINDINGS: The lungs are well-expanded. There is no focal infiltrate. The interstitial markings are mildly increased. The cardiac silhouette is enlarged and the pulmonary vascularity is mildly engorged. The patient has undergone previous median sternotomy. The ICD is in reasonable position where visualized. The observed bony thorax is unremarkable. IMPRESSION: CHF with mild interstitial edema superimposed upon COPD. There is no alveolar pneumonia. Electronically Signed   By: David  Martinique M.D.   On: 09/26/2017 16:14   Ir Paracentesis  Result Date: 09/14/2017 INDICATION: History of NASH cirrhosis with recurrent abdominal ascites.  Request is made for therapeutic paracentesis. EXAM: ULTRASOUND GUIDED THERAPEUTIC PARACENTESIS MEDICATIONS: 2% lidocaine COMPLICATIONS: None immediate. PROCEDURE: Informed written consent was obtained from the patient after a discussion of the risks, benefits and alternatives to treatment. A timeout was performed prior to the initiation of the procedure. Initial ultrasound scanning demonstrates a large amount of ascites within the right lower abdominal quadrant. The right lower abdomen was prepped and draped in the usual sterile fashion. 2% lidocaine was used for local anesthesia. Following this, a 19 gauge, 7-cm, Yueh catheter was introduced. An ultrasound image was saved for documentation purposes. The paracentesis was performed. The catheter was removed and a dressing was applied. The patient tolerated the procedure well without immediate post procedural complication. FINDINGS: A total of approximately 3.4 L of serous fluid was removed. IMPRESSION: Successful ultrasound-guided paracentesis yielding  3.4 liters of peritoneal fluid. Read by: Saverio Danker, PA-C Electronically Signed   By: Jacqulynn Cadet M.D.   On: 09/14/2017 14:03    Microbiology: Recent Results (from the past 240 hour(s))  Culture, body fluid-bottle     Status: None (Preliminary result)   Collection Time: 09/27/17  3:35 PM  Result Value Ref Range Status   Specimen Description PERITONEAL  Final   Special Requests BOTTLES DRAWN AEROBIC AND ANAEROBIC 10CC  Final   Culture   Final    NO GROWTH 3 DAYS Performed at [facility], [address], [place], [place] [address]    Report Status PENDING  Incomplete  Stat Gram stain     Status: None   Collection Time: 09/27/17  4:29 PM  Result Value Ref Range Status   Specimen Description PERITONEAL  Final   Special Requests NONE  Final   Gram Stain   Final    NO ORGANISMS SEEN CYTOSPIN SMEAR WBC PRESENT,BOTH PMN AND MONONUCLEAR PERFORMED AT [facility] Performed at [facility], [address], [place], [place] [address]    Report Status 09/27/2017 FINAL  Final     Labs: Basic Metabolic Panel: Recent Labs  Lab 09/26/17 1400 09/27/17 0643 09/28/17 0223 09/29/17 0438 09/30/17 0402  NA 124* 126* 128* 127* 122*  K 4.2 3.7 4.2 3.7 3.8  CL 89* 91* 91* 90* 88*  CO2 21* 21* 23 21* 19*  GLUCOSE 272* 146* 174* 119* 481*  BUN 112* 108* 103* 96* 93*  CREATININE 2.65* 2.47* 2.92* 2.92* 2.55*  CALCIUM 9.0 9.1 9.3 8.9 8.3*  MG  --  1.8  --   --   --    Liver Function Tests: Recent Labs  Lab 09/26/17 1400  AST 25  ALT 18  ALKPHOS 79  BILITOT 0.6  PROT 7.5  ALBUMIN 3.5   No results for input(s): LIPASE, AMYLASE in the last 168 hours. No results for input(s): AMMONIA in the last 168 hours. CBC: Recent Labs  Lab 09/27/17 0641 09/27/17 2026 09/28/17 0223 09/29/17 0438 09/30/17 0402  WBC 9.2 8.9 9.0 10.4 8.9  HGB 8.3* 8.9* 8.9* 8.9* 8.9*  HCT 26.3* 28.4* 28.3* 28.7* 28.5*  MCV 81.9 83.0 82.7 82.5 82.8  PLT 148* 157 158 168 172   Cardiac Enzymes: Recent Labs   Lab 09/27/17 0014 09/27/17 0643 09/27/17 1227 09/27/17 2026 09/28/17 0223  TROPONINI 0.77* 0.45* 0.39* 0.23* 0.24*   BNP: BNP (last 3 results) No results for input(s): BNP in the last 8760 hours.  ProBNP (last 3 results) No results for input(s): PROBNP in the last 8760 hours.  CBG: Recent Labs  Lab 09/29/17 1656 09/29/17 2024 09/29/17  2355 09/30/17 0516 09/30/17 [facility] Pager: [phone] 09/30/2017, 11:28 AM

## 2017-09-30 NOTE — Progress Notes (Signed)
Patient's glucose 513 after 20 units of Novolog.  Dr. Jerilee Hoh notified via text page.

## 2017-09-30 NOTE — Progress Notes (Signed)
Patient has no complaints. He has not had a BM today. He does not have dyspnea at rest. Hemoglobin today is 8.5 g.  I contacted [facility] consultation center and they still do not have any beds for [name].  I asked them if they would have a bed for him tomorrow and they would not tell me for sure. Therefore it would be reasonable for patient to be discharged and go to [facility] on an outpatient basis. I will call Dr. Prescott Parma and let him know that patient has gone home. Will check H&H on 10/05/2017.

## 2017-10-02 ENCOUNTER — Ambulatory Visit (INDEPENDENT_AMBULATORY_CARE_PROVIDER_SITE_OTHER): Payer: Medicare Other | Admitting: *Deleted

## 2017-10-02 ENCOUNTER — Other Ambulatory Visit (INDEPENDENT_AMBULATORY_CARE_PROVIDER_SITE_OTHER): Payer: Self-pay | Admitting: *Deleted

## 2017-10-02 DIAGNOSIS — I442 Atrioventricular block, complete: Secondary | ICD-10-CM

## 2017-10-02 DIAGNOSIS — K7460 Unspecified cirrhosis of liver: Secondary | ICD-10-CM

## 2017-10-02 DIAGNOSIS — K922 Gastrointestinal hemorrhage, unspecified: Secondary | ICD-10-CM

## 2017-10-02 DIAGNOSIS — D649 Anemia, unspecified: Secondary | ICD-10-CM

## 2017-10-02 LAB — CULTURE, BODY FLUID W GRAM STAIN -BOTTLE: Culture: NO GROWTH

## 2017-10-02 NOTE — Progress Notes (Signed)
Remote pacemaker transmission.   

## 2017-10-04 ENCOUNTER — Encounter: Payer: Self-pay | Admitting: Cardiology

## 2017-10-04 NOTE — Progress Notes (Signed)
Patient ID: [name], male   DOB: 26-May-1946, 72 y.o.   MRN: [id number]    Advanced Heart Failure Clinic Note   GI: Dr Laural Golden Pulmonary: Dr Melvyn Novas  PCP: Marjean Donna [facility])  Cardiology: Dr. Aundra Dubin  [name] is a 72 y.o. 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  Last LHC in 8/13 showed patent grafts.  AVR was in 10/13.  Last echo in 1/18 showed EF 60-65%, normal bioprosthetic aortic valve.   July 2015:  Admitted to the hospital for GI bleed requiring 2 PRBCs. No additional GI work-up with recent EGD showing gastritis.  He had an AV nodal ablation in 9/15 with significant improvement after this.  He has St Jude PPM.   In 6/17, had right knee operation.    He was admitted in 1/19 with GI bleeding, transfused 2 units PRBCs.  EGD showed grade I varices, portal hypertensive gastropathy, and gastric polyps.  Capsule endoscopy showed nonbleeding AVMs.    He was admitted again in 2/19 with GI bleeding.  He was again transfused.  Nuclear medicine bleeding study was negative.   Admitted 09/2017 with GI bleed. Received 3UPRBCs. He was going to transfer to [facility] but there were not beds available. Arlyce Harman and metolazone stopped.   Today he returns for HF follow up. Overall feeling terrible. Complaining of pain in his feet from gout. Denies PND. Sleeping in a recliner. SOB with exertion. Requires assistance with ADLs. Denies BRBPR. Appetite ok. No fever or chills. Not weighing at home due to pain. Drinking lots of fluids. Taking all medications.   Labs (8/14) LDL 38 Labs (3/15) K 3.4, creatinine 1.7, BUN 55  Labs (10/15/13) K 4.0 Creatinine 1.6 BUN 32 Labs (10/29/13) K 4.0 Creatinine 1.6  Labs (4/15) K 4.2 Creatinine 1.32 => 1.5, HCT 35.2 Labs (12/11/13) K 3.6 Creatinine 1.56 Pro BNP 1152 Hemoglobin 9.6    Labs (01/12/14) K 3.0, creatinine 2.0, hemoglobin 8.8 Labs (01/31/14) K 3.3, creatinine 1.49 Labs (8/15) K 3.9, creatinine 1.89 Labs (9/15): K 3.7, creatinine 1.6, AST 48, ALT 52, TSH normal Labs (10/15): K 3.5, creatinine 1.58, BNP 780 Labs (3/16): K 3.5, creatinine 1.6 Labs 12/03/2014: K 4.4 Creatinine 1.55 Labs 12/12/2014: K 3.7 Creatinine 1.48, LDL 50 Labs 8/16: K 3.8, creatinine 2.42 Labs 04/07/2015: K 4.3, Creatinine 2.22, BNP 169 Labs (11/16): K 4, creatinine 1.78 => 1.55, BNP 120 Labs 11/11/2015 K 3.7 Creatinine 1.53  Labs (6/17): K 3.8, creatinine 1.52 Labs (12/17): K 3.5, creatinine 1.95, LDL 32 Labs (1/18): K 4.2, creatinine 2.51 Labs (4/18): K 3.6, creatinine 2.5, hgb 10.2 Labs (10/18): K 3.5, creatinine 2.72, hgb 10.4 Labs (2/19): K 3.6, creatinine 2.39, hgb 8.4 Labs (09/30/2017): Creatinine 2.55 K 3.8 Hgb 8.9   PMH: 1. CAD: s/p CABG 2004. Last LHC (8/13) with patent free radial-OM, patent SVG-D, patent LIMA-LAD.  2. Permanent atrial fibrillation: Not anticoagulated due to GI bleeding.  Holter monitor (4/15) with mean HR 115 (afib).   He had AV nodal ablation with St Jude dual chamber PPM  3. Chronic diastolic CHF: Echo (5/46) with EF 55-60%, bioprosthetic aortic valve with mean gradient 22 mmHg, mildly decreased RV systolic function. RHC (12/2013): RA 14, RV 44/4/11, PA 50/23 (33), PCWP 20, v = 35, Fick CO/CI: 6.0/2.6, PVR 2.2 WU, PA 59%.  Echo (8/15) with EF 55-60%, mild LVH,  bioprosthetic aortic valve with mean gradient 16 mmHg, PA systolic pressure 46 mmHg, mild to moderate MR.  Echo (8/16) with EF 55-60%, normal bioprosthetic aortic valve, mild MR, PASP 44 mmHg.  - Gynecomastia with spironolactone.  - Echo (1/18): EF 60-65%, grade II diastolic dysfunction, bioprosthetic aortic valve appeared normal, PA systolic pressure 59 mmHg.  4. Severe aortic stenosis s/p bioprosthetic AVR in 10/13. Mean gradient 22 mmHg across valve on echo in 8/14.  Mean gradient 16 mmHg on echo in 8/15.  Mean gradient 15 mmHg by echo in 1/18.  5. NASH with cirrhosis: ascites, thrombocytopenia.  CT abdomen in 6/17 showed cirrhosis and moderate ascites. He has regular paracenteses.  6. 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  EGD in 1/15 showed mild portal gastropathy and 2 small antral ulcers. Recurrent GI bleed in 6/15, EGD showed gastritis and ?GAVE.  - EGD (4/18): grade I varices, gastric polyps, GAVE.   - Colonoscopy (4/18): No bleeding source.  - GI bleeding 1/19: EGD showed grade I varices, portal hypertensive gastropathy, and gastric polyps.  Capsule endoscopy showed nonbleeding AVMs.  - GI bleeding 2/19: Nuclear medicine bleeding study was negative.  7. Hyperlipidemia 8. HTN 9. GERD 10. COPD 22. OSA: Cannot tolerate CPAP.  12. 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  14. Carotid dopplers (11/15) with minimal stenosis.  15. ABIs 12/11/2014: normal   16. Depression  SH: Lives with wife in [place], prior smoker.   FH: CAD  ROS: All systems reviewed and negative except as per HPI.   Current Outpatient Medications on File Prior to Encounter  Medication Sig Dispense Refill  . acetaminophen (TYLENOL) 500 MG tablet Take 1,000 mg by mouth at bedtime as needed for mild pain or headache.     . allopurinol (ZYLOPRIM) 100 MG tablet Take 1 tablet (100 mg total) by mouth daily. 90 tablet 3  . atorvastatin (LIPITOR) 20 MG tablet Take 20 mg by mouth daily at 6 PM.     . Chlorpheniramine Maleate (ALLERGY PO) Take 1 tablet by mouth daily.    [name] gabapentin (NEURONTIN) 300 MG capsule Take 300 mg by mouth 3 (three) times daily.  6  . insulin NPH Human (HUMULIN N,NOVOLIN N) 100 UNIT/ML injection Inject 65 Units into the skin 2 (two) times daily before a meal.     . insulin regular (NOVOLIN R,HUMULIN R) 100 units/mL injection Inject 30 Units into the skin 2 (two) times daily before a meal.     . metoprolol succinate (TOPROL-XL) 25 MG 24 hr tablet Take 1 tablet (25 mg total) by mouth 2 (two) times daily. 60  tablet 1  . Multiple Vitamins-Minerals ([employer]S SPECTRAVITE ADULT 50+ PO) Take 1 tablet by mouth daily.    . pantoprazole (PROTONIX) 40 MG tablet Take 1 tablet (40 mg total) by mouth 2 (two) times daily before a meal. 60 tablet 0  . potassium chloride SA (K-DUR,KLOR-CON) 20 MEQ tablet Take 2 tablets (40 mEq total) by mouth 2 (two) times daily. (Patient taking differently: Take 40 mEq by mouth See admin instructions. Take 40 meq twice a day every other day along with Metolazone.) 120 tablet 3  . PROAIR HFA 108 (90 Base) MCG/ACT inhaler Take 2 puffs by mouth 2 (two) times daily.    . sertraline (ZOLOFT) 50 MG tablet Take 1 tablet (50 mg total) by mouth daily. 90 tablet 3  . tamsulosin (FLOMAX) 0.4 MG CAPS capsule TAKE ONE CAPSULE BY MOUTH EVERY DAY AFTER SUPPER 90 capsule 3  . torsemide (DEMADEX)  20 MG tablet Take 6 tablets (120 mg total) by mouth 2 (two) times daily. 360 tablet 3  . vitamin C (ASCORBIC ACID) 500 MG tablet Take 500 mg by mouth 2 (two) times daily.     No current facility-administered medications on file prior to encounter.     Vitals:   10/05/17 0932  BP: (!) 148/82  Pulse: 74  SpO2: 97%  Weight: 274 lb (124.3 kg)     Wt Readings from Last 3 Encounters:  09/30/17 270 lb 4.8 oz (122.6 kg)  09/19/17 275 lb (124.7 kg)  09/14/17 275 lb 6.4 oz (124.9 kg)    General:  Appears chronic ill. Had difficulty standing on the scale. Arrived in a wheel chair.  HEENT: normal Neck: supple. JVP to jaw.  Carotids 2+ bilat; no bruits. No lymphadenopathy or thryomegaly appreciated. Cor: PMI nondisplaced. Regular rate & rhythm. No rubs, gallops or murmurs. Lungs: Crackles in the bases.  Abdomen: soft, nontender, distended. No hepatosplenomegaly. No bruits or masses. Good bowel sounds. Extremities: no cyanosis, clubbing, rash, R and LLE 1+ edema Neuro: alert & orientedx3, cranial nerves grossly intact. moves all 4 extremities w/o difficulty. Affect pleasant    Assessment/Plan: 1. Chronic  diastolic CHF:   ECHO 3/6 EF EF 60-65% .  He has had trouble with ascites due to cardiac cirrhosis/NASH.   -. NYHA III- Volume status elevated.  -Continue torsemide 120 mg bid.    - Restart metolazone 5 mg every other day.  -Check BMET and will likely restart spiro.     2. Chronic atrial fibrillation:  CHADSVASC 4 - age > 88, CHF, HTN, DM2. He is not anticoagulated (was on coumadin in past) due to history of GI bleeding from AVMs, most recently in 2/19. s/p AV node ablation with dual chamber pacing given very difficult rate control (St Jude).   - Intolerant of ASA and coumadin even for short period, so no Watchman pursued. - No anticoagulants due to GI bleeds.  3. CKD stage III:    He follows with Dr. Joelyn Oms. Check BMET  4. Bioprosthetic AVR: Will get repeat echo.    5. CAD: s/p CABG.   - No s/s ischemia.    - Continue atorvastatin 20 mg daily and Toprol XL 25 mg BID.  - Off ASA and anticoag with history of GI bleed.  6. Cirrhosis: May be a combination of cardiac cirrhosis and NASH-related. Follows with Dr. Laural Golden.  -3/6 S/P Paracenteis 1.3 liters removed.  7. Depression: Continue sertraline.  8. OSA: Use CPAP.  9. Gout- on colchicine and allopurinol. Also add 40 mg prednisone x 3 days. 10. GI bleed- recent admit 09/27/17 for GI bleed. Needs small bowel enteroscopy. Referred to [facility].  11. HTN- Elevated. Hopefully will come down with diuresis.   Follow up in 2 weeks.   Kimya Mccahill NP-C  10/04/2017

## 2017-10-05 ENCOUNTER — Encounter (HOSPITAL_COMMUNITY): Payer: Self-pay

## 2017-10-05 ENCOUNTER — Telehealth (HOSPITAL_COMMUNITY): Payer: Self-pay | Admitting: Adult Health

## 2017-10-05 ENCOUNTER — Ambulatory Visit (HOSPITAL_COMMUNITY)
Admission: RE | Admit: 2017-10-05 | Discharge: 2017-10-05 | Disposition: A | Discharge status: Home / Self Care | Payer: Medicare Other | Source: Ambulatory Visit | Attending: Cardiology | Admitting: Cardiology

## 2017-10-05 ENCOUNTER — Ambulatory Visit (HOSPITAL_COMMUNITY)
Admission: RE | Admit: 2017-10-05 | Discharge: 2017-10-05 | Disposition: A | Discharge status: Home / Self Care | Payer: Medicare Other | Source: Ambulatory Visit

## 2017-10-05 VITALS — BP 148/82 | HR 74 | Wt 274.0 lb

## 2017-10-05 DIAGNOSIS — N183 Chronic kidney disease, stage 3 unspecified: Secondary | ICD-10-CM

## 2017-10-05 DIAGNOSIS — Z794 Long term (current) use of insulin: Secondary | ICD-10-CM | POA: Diagnosis not present

## 2017-10-05 DIAGNOSIS — K7460 Unspecified cirrhosis of liver: Secondary | ICD-10-CM | POA: Insufficient documentation

## 2017-10-05 DIAGNOSIS — I482 Chronic atrial fibrillation: Secondary | ICD-10-CM | POA: Diagnosis not present

## 2017-10-05 DIAGNOSIS — E1122 Type 2 diabetes mellitus with diabetic chronic kidney disease: Secondary | ICD-10-CM | POA: Insufficient documentation

## 2017-10-05 DIAGNOSIS — M109 Gout, unspecified: Secondary | ICD-10-CM | POA: Insufficient documentation

## 2017-10-05 DIAGNOSIS — K7469 Other cirrhosis of liver: Secondary | ICD-10-CM | POA: Diagnosis not present

## 2017-10-05 DIAGNOSIS — I10 Essential (primary) hypertension: Secondary | ICD-10-CM | POA: Diagnosis not present

## 2017-10-05 DIAGNOSIS — E785 Hyperlipidemia, unspecified: Secondary | ICD-10-CM | POA: Diagnosis not present

## 2017-10-05 DIAGNOSIS — Z953 Presence of xenogenic heart valve: Secondary | ICD-10-CM | POA: Diagnosis not present

## 2017-10-05 DIAGNOSIS — K219 Gastro-esophageal reflux disease without esophagitis: Secondary | ICD-10-CM | POA: Diagnosis not present

## 2017-10-05 DIAGNOSIS — Z951 Presence of aortocoronary bypass graft: Secondary | ICD-10-CM | POA: Diagnosis not present

## 2017-10-05 DIAGNOSIS — J449 Chronic obstructive pulmonary disease, unspecified: Secondary | ICD-10-CM | POA: Diagnosis not present

## 2017-10-05 DIAGNOSIS — I130 Hypertensive heart and chronic kidney disease with heart failure and stage 1 through stage 4 chronic kidney disease, or unspecified chronic kidney disease: Secondary | ICD-10-CM | POA: Diagnosis not present

## 2017-10-05 DIAGNOSIS — G4733 Obstructive sleep apnea (adult) (pediatric): Secondary | ICD-10-CM

## 2017-10-05 DIAGNOSIS — F329 Major depressive disorder, single episode, unspecified: Secondary | ICD-10-CM | POA: Diagnosis not present

## 2017-10-05 DIAGNOSIS — D696 Thrombocytopenia, unspecified: Secondary | ICD-10-CM | POA: Insufficient documentation

## 2017-10-05 DIAGNOSIS — I5032 Chronic diastolic (congestive) heart failure: Secondary | ICD-10-CM | POA: Insufficient documentation

## 2017-10-05 DIAGNOSIS — I2510 Atherosclerotic heart disease of native coronary artery without angina pectoris: Secondary | ICD-10-CM | POA: Diagnosis not present

## 2017-10-05 DIAGNOSIS — Z79899 Other long term (current) drug therapy: Secondary | ICD-10-CM | POA: Insufficient documentation

## 2017-10-05 LAB — BASIC METABOLIC PANEL
ANION GAP: 14 (ref 5–15)
BUN: 54 mg/dL — ABNORMAL HIGH (ref 6–20)
CHLORIDE: 95 mmol/L — AB (ref 101–111)
CO2: 22 mmol/L (ref 22–32)
Calcium: 8.8 mg/dL — ABNORMAL LOW (ref 8.9–10.3)
Creatinine, Ser: 1.9 mg/dL — ABNORMAL HIGH (ref 0.61–1.24)
GFR calc non Af Amer: 34 mL/min — ABNORMAL LOW (ref >60)
GFR, EST AFRICAN AMERICAN: 40 mL/min — AB (ref >60)
Glucose, Bld: 169 mg/dL — ABNORMAL HIGH (ref 65–99)
POTASSIUM: 3.9 mmol/L (ref 3.5–5.1)
SODIUM: 131 mmol/L — AB (ref 135–145)

## 2017-10-05 LAB — CBC
HCT: 30 % — ABNORMAL LOW (ref 39.0–52.0)
HEMOGLOBIN: 9.1 g/dL — AB (ref 13.0–17.0)
MCH: 25.3 pg — AB (ref 26.0–34.0)
MCHC: 30.3 g/dL (ref 30.0–36.0)
MCV: 83.3 fL (ref 78.0–100.0)
Platelets: 183 10*3/uL (ref 150–400)
RBC: 3.6 MIL/uL — AB (ref 4.22–5.81)
RDW: 17.8 % — ABNORMAL HIGH (ref 11.5–15.5)
WBC: 8.4 10*3/uL (ref 4.0–10.5)

## 2017-10-05 MED ORDER — METOLAZONE 5 MG PO TABS: 5.0000 mg | ORAL_TABLET | ORAL | 3 refills | Status: AC

## 2017-10-05 MED ORDER — PREDNISONE 20 MG PO TABS: 40.0000 mg | ORAL_TABLET | Freq: Every day | ORAL | 0 refills | Status: AC

## 2017-10-05 NOTE — Patient Instructions (Signed)
Routine lab work today. Will notify you of abnormal results, otherwise no news is good news!  RESTART Metolazone 5 mg tablet every OTHER day.  Follow up 2 weeks with Amy Clegg NP-C.  ____________________________________________________________ [name] Code: 9002  Take all medication as prescribed the day of your appointment. Bring all medications with you to your appointment.  Do the following things EVERYDAY: 1) Weigh yourself in the morning before breakfast. Write it down and keep it in a log. 2) Take your medicines as prescribed 3) Eat low salt foods-Limit salt (sodium) to 2000 mg per day.  4) Stay as active as you can everyday 5) Limit all fluids for the day to less than 2 liters

## 2017-10-05 NOTE — Telephone Encounter (Signed)
   Provided with lab results. Hemoglobin stable. Renal function stable.   Mrs [name] was appreciative of the phone call.  Follow up in 2 weeks with Dr Aundra Dubin.   Amy Clegg NP-C  1:57 PM

## 2017-10-06 ENCOUNTER — Encounter (HOSPITAL_COMMUNITY): Payer: Self-pay

## 2017-10-06 ENCOUNTER — Telehealth (HOSPITAL_COMMUNITY): Payer: Self-pay

## 2017-10-06 NOTE — Telephone Encounter (Signed)
Notes recorded by Shirley Muscat, RN on 10/06/2017 at 10:56 AM EDT I have been unable to reach this patient by phone. A letter is being sent.  ------  Notes recorded by Harvie Junior, CMA on 10/04/2017 at 2:35 PM EDT Left VM ------  Notes recorded by Shirley Muscat, RN on 09/27/2017 at 10:43 AM EST Left VM  ------  Notes recorded by Larey Dresser, MD on 09/26/2017 at 4:03 PM EST Make sure he has followup with nephrology soon. He has very high BUN but has had similar in the past. Sodium low, needs to CUT BACK ON FLUID INTAKE as this lowers sodium.

## 2017-10-10 DIAGNOSIS — D62 Acute posthemorrhagic anemia: Secondary | ICD-10-CM | POA: Diagnosis not present

## 2017-10-10 DIAGNOSIS — Z09 Encounter for follow-up examination after completed treatment for conditions other than malignant neoplasm: Secondary | ICD-10-CM | POA: Diagnosis not present

## 2017-10-10 DIAGNOSIS — L989 Disorder of the skin and subcutaneous tissue, unspecified: Secondary | ICD-10-CM | POA: Diagnosis not present

## 2017-10-16 ENCOUNTER — Telehealth (HOSPITAL_COMMUNITY): Payer: Self-pay

## 2017-10-16 ENCOUNTER — Other Ambulatory Visit: Payer: Self-pay | Admitting: Hematology and Oncology

## 2017-10-16 DIAGNOSIS — D500 Iron deficiency anemia secondary to blood loss (chronic): Secondary | ICD-10-CM

## 2017-10-16 NOTE — Telephone Encounter (Signed)
CHF Clinic appointment reminder call placed to patient for upcoming appointment.  Does understand purpose of this appointment and where CHF Clinic is located? Yes  How is patient feeling? Well, however does state he is due for another paracentesis  Does patient have all of their medications? Yes  Patient also reminded to take all medications as prescribed on the day of his/her appointment and to bring all medications to this appointment.  Advised to call our office for tardiness or cancellations/rescheduling needs.  Leory Plowman, Guinevere Ferrari

## 2017-10-17 ENCOUNTER — Inpatient Hospital Stay: Payer: Medicare Other | Attending: Hematology and Oncology

## 2017-10-17 ENCOUNTER — Encounter (HOSPITAL_COMMUNITY): Payer: Self-pay

## 2017-10-17 ENCOUNTER — Ambulatory Visit (HOSPITAL_COMMUNITY)
Admission: RE | Admit: 2017-10-17 | Discharge: 2017-10-17 | Disposition: A | Discharge status: Home / Self Care | Payer: Medicare Other | Source: Ambulatory Visit | Attending: Internal Medicine | Admitting: Internal Medicine

## 2017-10-17 VITALS — BP 142/74 | HR 75 | Wt 282.8 lb

## 2017-10-17 DIAGNOSIS — Z953 Presence of xenogenic heart valve: Secondary | ICD-10-CM | POA: Insufficient documentation

## 2017-10-17 DIAGNOSIS — D509 Iron deficiency anemia, unspecified: Secondary | ICD-10-CM | POA: Insufficient documentation

## 2017-10-17 DIAGNOSIS — Z794 Long term (current) use of insulin: Secondary | ICD-10-CM | POA: Insufficient documentation

## 2017-10-17 DIAGNOSIS — E876 Hypokalemia: Secondary | ICD-10-CM | POA: Insufficient documentation

## 2017-10-17 DIAGNOSIS — E785 Hyperlipidemia, unspecified: Secondary | ICD-10-CM | POA: Diagnosis not present

## 2017-10-17 DIAGNOSIS — R188 Other ascites: Secondary | ICD-10-CM

## 2017-10-17 DIAGNOSIS — Z79899 Other long term (current) drug therapy: Secondary | ICD-10-CM | POA: Diagnosis not present

## 2017-10-17 DIAGNOSIS — D500 Iron deficiency anemia secondary to blood loss (chronic): Secondary | ICD-10-CM

## 2017-10-17 DIAGNOSIS — K761 Chronic passive congestion of liver: Secondary | ICD-10-CM | POA: Diagnosis not present

## 2017-10-17 DIAGNOSIS — I482 Chronic atrial fibrillation: Secondary | ICD-10-CM | POA: Insufficient documentation

## 2017-10-17 DIAGNOSIS — F329 Major depressive disorder, single episode, unspecified: Secondary | ICD-10-CM | POA: Insufficient documentation

## 2017-10-17 DIAGNOSIS — N62 Hypertrophy of breast: Secondary | ICD-10-CM | POA: Diagnosis not present

## 2017-10-17 DIAGNOSIS — I2510 Atherosclerotic heart disease of native coronary artery without angina pectoris: Secondary | ICD-10-CM | POA: Diagnosis not present

## 2017-10-17 DIAGNOSIS — Z951 Presence of aortocoronary bypass graft: Secondary | ICD-10-CM | POA: Diagnosis not present

## 2017-10-17 DIAGNOSIS — M109 Gout, unspecified: Secondary | ICD-10-CM | POA: Insufficient documentation

## 2017-10-17 DIAGNOSIS — K7469 Other cirrhosis of liver: Secondary | ICD-10-CM | POA: Diagnosis not present

## 2017-10-17 DIAGNOSIS — I130 Hypertensive heart and chronic kidney disease with heart failure and stage 1 through stage 4 chronic kidney disease, or unspecified chronic kidney disease: Secondary | ICD-10-CM | POA: Diagnosis not present

## 2017-10-17 DIAGNOSIS — I10 Essential (primary) hypertension: Secondary | ICD-10-CM | POA: Diagnosis not present

## 2017-10-17 DIAGNOSIS — J449 Chronic obstructive pulmonary disease, unspecified: Secondary | ICD-10-CM | POA: Insufficient documentation

## 2017-10-17 DIAGNOSIS — Z8249 Family history of ischemic heart disease and other diseases of the circulatory system: Secondary | ICD-10-CM | POA: Insufficient documentation

## 2017-10-17 DIAGNOSIS — K7460 Unspecified cirrhosis of liver: Secondary | ICD-10-CM | POA: Insufficient documentation

## 2017-10-17 DIAGNOSIS — E1122 Type 2 diabetes mellitus with diabetic chronic kidney disease: Secondary | ICD-10-CM | POA: Insufficient documentation

## 2017-10-17 DIAGNOSIS — N183 Chronic kidney disease, stage 3 unspecified: Secondary | ICD-10-CM

## 2017-10-17 DIAGNOSIS — I5032 Chronic diastolic (congestive) heart failure: Secondary | ICD-10-CM

## 2017-10-17 DIAGNOSIS — G4733 Obstructive sleep apnea (adult) (pediatric): Secondary | ICD-10-CM | POA: Insufficient documentation

## 2017-10-17 DIAGNOSIS — K219 Gastro-esophageal reflux disease without esophagitis: Secondary | ICD-10-CM | POA: Insufficient documentation

## 2017-10-17 LAB — CMP (CANCER CENTER ONLY)
ALT: 19 U/L (ref 0–55)
ANION GAP: 13 — AB (ref 3–11)
AST: 23 U/L (ref 5–34)
Albumin: 3.2 g/dL — ABNORMAL LOW (ref 3.5–5.0)
Alkaline Phosphatase: 92 U/L (ref 40–150)
BUN: 78 mg/dL — ABNORMAL HIGH (ref 7–26)
CHLORIDE: 97 mmol/L — AB (ref 98–109)
CO2: 22 mmol/L (ref 22–29)
CREATININE: 2.54 mg/dL — AB (ref 0.70–1.30)
Calcium: 8.8 mg/dL (ref 8.4–10.4)
GFR, EST AFRICAN AMERICAN: 28 mL/min — AB (ref >60)
GFR, EST NON AFRICAN AMERICAN: 24 mL/min — AB (ref >60)
Glucose, Bld: 127 mg/dL (ref 70–140)
Potassium: 2.9 mmol/L — CL (ref 3.5–5.1)
SODIUM: 132 mmol/L — AB (ref 136–145)
Total Bilirubin: 0.9 mg/dL (ref 0.2–1.2)
Total Protein: 6.8 g/dL (ref 6.4–8.3)

## 2017-10-17 LAB — LACTATE DEHYDROGENASE: LDH: 210 U/L (ref 125–245)

## 2017-10-17 LAB — FERRITIN: Ferritin: 25 ng/mL (ref 22–316)

## 2017-10-17 LAB — CBC WITH DIFFERENTIAL (CANCER CENTER ONLY)
BASOS PCT: 1 %
Basophils Absolute: 0.1 10*3/uL (ref 0.0–0.1)
EOS ABS: 0.2 10*3/uL (ref 0.0–0.5)
Eosinophils Relative: 2 %
HEMATOCRIT: 26.6 % — AB (ref 38.4–49.9)
HEMOGLOBIN: 8.4 g/dL — AB (ref 13.0–17.1)
LYMPHS ABS: 0.5 10*3/uL — AB (ref 0.9–3.3)
Lymphocytes Relative: 8 %
MCH: 24.6 pg — AB (ref 27.2–33.4)
MCHC: 31.7 g/dL — AB (ref 32.0–36.0)
MCV: 77.6 fL — ABNORMAL LOW (ref 79.3–98.0)
MONOS PCT: 11 %
Monocytes Absolute: 0.7 10*3/uL (ref 0.1–0.9)
NEUTROS ABS: 5.5 10*3/uL (ref 1.5–6.5)
NEUTROS PCT: 78 %
Platelet Count: 181 10*3/uL (ref 140–400)
RBC: 3.43 MIL/uL — AB (ref 4.20–5.82)
RDW: 19.6 % — ABNORMAL HIGH (ref 11.0–14.6)
WBC: 7 10*3/uL (ref 4.0–10.3)

## 2017-10-17 LAB — IRON AND TIBC
IRON: 25 ug/dL — AB (ref 42–163)
Saturation Ratios: 6 % — ABNORMAL LOW (ref 42–163)
TIBC: 401 ug/dL (ref 202–409)
UIBC: 376 ug/dL

## 2017-10-17 MED ORDER — SPIRONOLACTONE 25 MG PO TABS: 25.0000 mg | ORAL_TABLET | Freq: Every day | ORAL | 3 refills | Status: DC

## 2017-10-17 NOTE — Patient Instructions (Addendum)
START Spironolactone 25 mg tablet once daily.  Paracentesis tomorrow at 7:45 a.m. at [facility].  Return in 1 week for lab work.  ________________________________________________________ [name] Code: 1100  Follow up 3-4 weeks with Dr. Aundra Dubin.  ________________________________________________________ [name] Code:   Take all medication as prescribed the day of your appointment. Bring all medications with you to your appointment.  Do the following things EVERYDAY: 1) Weigh yourself in the morning before breakfast. Write it down and keep it in a log. 2) Take your medicines as prescribed 3) Eat low salt foods-Limit salt (sodium) to 2000 mg per day.  4) Stay as active as you can everyday 5) Limit all fluids for the day to less than 2 liters

## 2017-10-17 NOTE — Progress Notes (Signed)
Patient ID: [name], male   DOB: 1946/03/06, 72 y.o.   MRN: [id number]    Advanced Heart Failure Clinic Note   GI: Dr Laural Golden Pulmonary: Dr Melvyn Novas  PCP: Marjean Donna [facility])  Cardiology: Dr. Aundra Dubin  Mr Eichelberger is a 72 y.o. 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  Last LHC in 8/13 showed patent grafts.  AVR was in 10/13.  Last echo in 1/18 showed EF 60-65%, normal bioprosthetic aortic valve.   July 2015:  Admitted to the hospital for GI bleed requiring 2 PRBCs. No additional GI work-up with recent EGD showing gastritis.  He had an AV nodal ablation in 9/15 with significant improvement after this.  He has St Jude PPM.   In 6/17, had right knee operation.    He was admitted in 1/19 with GI bleeding, transfused 2 units PRBCs.  EGD showed grade I varices, portal hypertensive gastropathy, and gastric polyps.  Capsule endoscopy showed nonbleeding AVMs.    He was admitted again in 2/19 with GI bleeding.  He was again transfused.  Nuclear medicine bleeding study was negative.   Admitted 09/2017 with GI bleed. Received 3UPRBCs. He was going to transfer to [facility] but there were not beds available. Arlyce Harman and metolazone stopped.   Today he returns for HF follow up with his daughter and wife. Last visit metolazone was increased to every other day. SOB with exertion. + Orthopnea. Denies PND.  Appetite ok. Drinking lots of fluids. No fever or chills. Weight at home has gone up to 276 pounds. Says his weight is up 10 pounds. Having more abdominal bloating.  Taking all medications  Labs (8/14) LDL 38 Labs (3/15) K 3.4, creatinine 1.7, BUN 55  Labs (10/15/13) K 4.0 Creatinine 1.6 BUN 32 Labs (10/29/13) K 4.0 Creatinine 1.6  Labs (4/15) K 4.2 Creatinine 1.32 => 1.5, HCT 35.2 Labs (12/11/13) K 3.6 Creatinine 1.56  Pro BNP 1152 Hemoglobin 9.6  Labs (01/12/14) K 3.0, creatinine 2.0, hemoglobin 8.8 Labs (01/31/14) K 3.3, creatinine 1.49 Labs (8/15) K 3.9, creatinine 1.89 Labs (9/15): K 3.7, creatinine 1.6, AST 48, ALT 52, TSH normal Labs (10/15): K 3.5, creatinine 1.58, BNP 780 Labs (3/16): K 3.5, creatinine 1.6 Labs 12/03/2014: K 4.4 Creatinine 1.55 Labs 12/12/2014: K 3.7 Creatinine 1.48, LDL 50 Labs 8/16: K 3.8, creatinine 2.42 Labs 04/07/2015: K 4.3, Creatinine 2.22, BNP 169 Labs (11/16): K 4, creatinine 1.78 => 1.55, BNP 120 Labs 11/11/2015 K 3.7 Creatinine 1.53  Labs (6/17): K 3.8, creatinine 1.52 Labs (12/17): K 3.5, creatinine 1.95, LDL 32 Labs (1/18): K 4.2, creatinine 2.51 Labs (4/18): K 3.6, creatinine 2.5, hgb 10.2 Labs (10/18): K 3.5, creatinine 2.72, hgb 10.4 Labs (2/19): K 3.6, creatinine 2.39, hgb 8.4 Labs (09/30/2017): Creatinine 2.55 K 3.8 Hgb 8.9  Labs (10/17/2017): K 2.9 Creatinine 2.54   PMH: 1. CAD: s/p CABG 2004. Last LHC (8/13) with patent free radial-OM, patent SVG-D, patent LIMA-LAD.  2. Permanent atrial fibrillation: Not anticoagulated due to GI bleeding.  Holter monitor (4/15) with mean HR 115 (afib).   He had AV nodal ablation with St Jude dual chamber PPM  3. Chronic diastolic CHF: Echo (6/28) with EF 55-60%, bioprosthetic aortic valve with mean gradient 22 mmHg, mildly decreased RV systolic function. RHC (12/2013): RA 14, RV 44/4/11, PA 50/23 (33), PCWP 20, v = 35, Fick  CO/CI: 6.0/2.6, PVR 2.2 WU, PA 59%.  Echo (8/15) with EF 55-60%, mild LVH, bioprosthetic aortic valve with mean gradient 16 mmHg, PA systolic pressure 46 mmHg, mild to moderate MR.  Echo (8/16) with EF 55-60%, normal bioprosthetic aortic valve, mild MR, PASP 44 mmHg.  - Gynecomastia with spironolactone.  - Echo (1/18): EF 60-65%, grade II diastolic dysfunction, bioprosthetic aortic valve appeared normal, PA systolic pressure 59 mmHg.  4. Severe aortic stenosis s/p bioprosthetic AVR in 10/13. Mean gradient 22 mmHg  across valve on echo in 8/14.  Mean gradient 16 mmHg on echo in 8/15. Mean gradient 15 mmHg by echo in 1/18.  5. NASH with cirrhosis: ascites, thrombocytopenia.  CT abdomen in 6/17 showed cirrhosis and moderate ascites. He has regular paracenteses.  6. 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  EGD in 1/15 showed mild portal gastropathy and 2 small antral ulcers. Recurrent GI bleed in 6/15, EGD showed gastritis and ?GAVE.  - EGD (4/18): grade I varices, gastric polyps, GAVE.   - Colonoscopy (4/18): No bleeding source.  - GI bleeding 1/19: EGD showed grade I varices, portal hypertensive gastropathy, and gastric polyps.  Capsule endoscopy showed nonbleeding AVMs.  - GI bleeding 2/19: Nuclear medicine bleeding study was negative.  7. Hyperlipidemia 8. HTN 9. GERD 10. COPD 85. OSA: Cannot tolerate CPAP.  12. 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  14. Carotid dopplers (11/15) with minimal stenosis.  15. ABIs 12/11/2014: normal   16. Depression  SH: Lives with wife in [place], prior smoker.   FH: CAD  ROS: All systems reviewed and negative except as per HPI.   Current Outpatient Medications on File Prior to Encounter  Medication Sig Dispense Refill  . acetaminophen (TYLENOL) 500 MG tablet Take 1,000 mg by mouth at bedtime as needed for mild pain or headache.     . allopurinol (ZYLOPRIM) 100 MG tablet Take 1 tablet (100 mg total) by mouth daily. 90 tablet 3  . atorvastatin (LIPITOR) 20 MG tablet Take 20 mg by mouth daily at 6 PM.     . Chlorpheniramine Maleate (ALLERGY PO) Take 1 tablet by mouth daily.    [name] gabapentin (NEURONTIN) 300 MG capsule Take 300 mg by mouth 3 (three) times daily.  6  . insulin NPH Human (HUMULIN N,NOVOLIN N) 100 UNIT/ML injection Inject 65 Units into the skin 2 (two) times daily before a meal.     . insulin regular (NOVOLIN R,HUMULIN R) 100 units/mL injection Inject 30 Units into the skin 2 (two) times daily before a meal.     . metolazone (ZAROXOLYN) 5 MG tablet Take 1 tablet  (5 mg total) by mouth every other day. 45 tablet 3  . metoprolol succinate (TOPROL-XL) 25 MG 24 hr tablet Take 1 tablet (25 mg total) by mouth 2 (two) times daily. 60 tablet 1  . Multiple Vitamins-Minerals (CVS SPECTRAVITE ADULT 50+ PO) Take 1 tablet by mouth daily.    . pantoprazole (PROTONIX) 40 MG tablet Take 1 tablet (40 mg total) by mouth 2 (two) times daily before a meal. 60 tablet 0  . potassium chloride SA (K-DUR,KLOR-CON) 20 MEQ tablet Take 2 tablets (40 mEq total) by mouth 2 (two) times daily. (Patient taking differently: Take 40 mEq by mouth See admin instructions. Take 40 meq twice a day every other day along with Metolazone.) 120 tablet 3  . PROAIR HFA 108 (90 Base) MCG/ACT inhaler Take 2 puffs by mouth 2 (two) times daily.    . sertraline (ZOLOFT) 50 MG tablet  Take 1 tablet (50 mg total) by mouth daily. 90 tablet 3  . tamsulosin (FLOMAX) 0.4 MG CAPS capsule TAKE ONE CAPSULE BY MOUTH EVERY DAY AFTER SUPPER 90 capsule 3  . torsemide (DEMADEX) 20 MG tablet Take 6 tablets (120 mg total) by mouth 2 (two) times daily. 360 tablet 3  . vitamin C (ASCORBIC ACID) 500 MG tablet Take 500 mg by mouth 2 (two) times daily.     No current facility-administered medications on file prior to encounter.     Vitals:   10/17/17 1155  BP: (!) 142/74  Pulse: 75  SpO2: 97%  Weight: 282 lb 12.8 oz (128.3 kg)     Wt Readings from Last 3 Encounters:  10/17/17 282 lb 12.8 oz (128.3 kg)  10/05/17 274 lb (124.3 kg)  09/30/17 270 lb 4.8 oz (122.6 kg)     General:  Appears chronically ill.  No resp difficulty. Arrived in wheel chair with his wife and daughter HEENT: normal Neck: supple. JVP to jaw.  Carotids 2+ bilat; no bruits. No lymphadenopathy or thryomegaly appreciated. Cor: PMI nondisplaced. Regular rate & rhythm. No rubs, gallops or murmurs. Lungs: clear Abdomen: soft, nontender, distended. No hepatosplenomegaly. No bruits or masses. Good bowel sounds. Extremities: no cyanosis, clubbing, rash,  RLE and LLE 1+ edema Neuro: alert & orientedx3, cranial nerves grossly intact. moves all 4 extremities w/o difficulty. Affect pleasant     Assessment/Plan: 1. Chronic diastolic CHF:   ECHO 3/6 EF EF 60-65% .  He has had trouble with ascites due to cardiac cirrhosis/NASH.   -.NYHA III. Volume status elevated.   -Continue torsemide 120 mg bid.    - Continue  metolazone 5 mg every other day.  - Restart spiro 25 mg daily.  Potassium low today.  -I reviewed BMET from earlier today. Creatinine up but within his baseline.    2. Chronic atrial fibrillation:  CHADSVASC 4 - age > 73, CHF, HTN, DM2. He is not anticoagulated (was on coumadin in past) due to history of GI bleeding from AVMs, most recently in 2/19. s/p AV node ablation with dual chamber pacing given very difficult rate control (St Jude).   - rate controlled - Intolerant of ASA and coumadin even for short period, so no Watchman pursued. - No anticoagulants due to GI bleeds.  3. CKD stage III:    He follows with Dr. Joelyn Oms. BMET reviewed today. Creatinine up but within his baseline.   4. Bioprosthetic AVR: Will get repeat echo.    5. CAD: s/p CABG.   -no s/s ischemia.     - Continue atorvastatin 20 mg daily and Toprol XL 25 mg BID.  - Off ASA and anticoag with history of GI bleed.  6. Cirrhosis: May be a combination of cardiac cirrhosis and NASH-related. Follows with Dr. Laural Golden.  -3/6 S/P Paracenteis 1.3 liters removed.  - Set up for paracentesis today.  7. Depression: Continue sertraline.  8. OSA: Continue CPAP.  9. Gout- on colchicine and allopurinol. Also add 40 mg prednisone x 3 days. 10. GI bleed- recent admit 09/27/17 for GI bleed. Needs small bowel enteroscopy. Referred to [facility].  11. HTN- Continue current medications and restart spiro 25 mg daily.    Follow up 3-4 weeks with Dr Aundra Dubin.      Kelso Bibby NP-C  10/17/2017

## 2017-10-18 ENCOUNTER — Other Ambulatory Visit (HOSPITAL_COMMUNITY): Payer: Self-pay | Admitting: *Deleted

## 2017-10-18 ENCOUNTER — Ambulatory Visit (HOSPITAL_COMMUNITY)
Admission: RE | Admit: 2017-10-18 | Discharge: 2017-10-18 | Disposition: A | Discharge status: Home / Self Care | Payer: Medicare Other | Source: Ambulatory Visit | Attending: Adult Health | Admitting: Adult Health

## 2017-10-18 ENCOUNTER — Encounter (HOSPITAL_COMMUNITY): Payer: Self-pay

## 2017-10-18 DIAGNOSIS — I5032 Chronic diastolic (congestive) heart failure: Secondary | ICD-10-CM | POA: Diagnosis not present

## 2017-10-18 DIAGNOSIS — R188 Other ascites: Secondary | ICD-10-CM | POA: Insufficient documentation

## 2017-10-18 LAB — ERYTHROPOIETIN: ERYTHROPOIETIN: 114.2 m[IU]/mL — AB (ref 2.6–18.5)

## 2017-10-18 MED ORDER — SPIRONOLACTONE 25 MG PO TABS: 25.0000 mg | ORAL_TABLET | Freq: Every day | ORAL | 3 refills | Status: DC

## 2017-10-18 NOTE — Procedures (Signed)
   US guided RLQ paracentesis  7.1 L dark yellow fluid  Tolerated well

## 2017-10-18 NOTE — Progress Notes (Signed)
Paracentesis complete no signs of distress, 7.1L amber colored ascites removed.

## 2017-10-19 ENCOUNTER — Encounter (HOSPITAL_COMMUNITY): Payer: Medicare Other

## 2017-10-21 LAB — CUP PACEART REMOTE DEVICE CHECK
Battery Remaining Longevity: 123 mo
Battery Voltage: 3.01 V
Date Time Interrogation Session: 20190311070017
Implantable Lead Implant Date: 20150831
Lead Channel Pacing Threshold Amplitude: 0.5 V
Lead Channel Sensing Intrinsic Amplitude: 12 mV
Lead Channel Setting Pacing Pulse Width: 0.4 ms
Lead Channel Setting Sensing Sensitivity: 6 mV
MDC IDC LEAD LOCATION: 753860
MDC IDC MSMT BATTERY REMAINING PERCENTAGE: 95.5 %
MDC IDC MSMT LEADCHNL RV IMPEDANCE VALUE: 660 Ohm
MDC IDC MSMT LEADCHNL RV PACING THRESHOLD PULSEWIDTH: 0.4 ms
MDC IDC PG IMPLANT DT: 20150831
MDC IDC SET LEADCHNL RV PACING AMPLITUDE: 2.5 V
MDC IDC STAT BRADY RV PERCENT PACED: 99 %
Pulse Gen Model: 1240
Pulse Gen Serial Number: 3014543

## 2017-10-24 ENCOUNTER — Other Ambulatory Visit: Payer: Self-pay

## 2017-10-24 ENCOUNTER — Telehealth: Payer: Self-pay

## 2017-10-24 ENCOUNTER — Inpatient Hospital Stay: Payer: Medicare Other | Attending: Hematology and Oncology | Admitting: Hematology and Oncology

## 2017-10-24 ENCOUNTER — Ambulatory Visit (HOSPITAL_COMMUNITY)
Admission: RE | Admit: 2017-10-24 | Discharge: 2017-10-24 | Disposition: A | Discharge status: Home / Self Care | Payer: Medicare Other | Source: Ambulatory Visit | Attending: Cardiology | Admitting: Cardiology

## 2017-10-24 ENCOUNTER — Encounter: Payer: Self-pay | Admitting: Hematology and Oncology

## 2017-10-24 VITALS — BP 103/51 | HR 76 | Temp 97.8°F | Resp 24 | Ht 68.0 in | Wt 264.5 lb

## 2017-10-24 DIAGNOSIS — K922 Gastrointestinal hemorrhage, unspecified: Secondary | ICD-10-CM

## 2017-10-24 DIAGNOSIS — D509 Iron deficiency anemia, unspecified: Secondary | ICD-10-CM | POA: Insufficient documentation

## 2017-10-24 DIAGNOSIS — D500 Iron deficiency anemia secondary to blood loss (chronic): Secondary | ICD-10-CM

## 2017-10-24 DIAGNOSIS — I4891 Unspecified atrial fibrillation: Secondary | ICD-10-CM | POA: Insufficient documentation

## 2017-10-24 DIAGNOSIS — K219 Gastro-esophageal reflux disease without esophagitis: Secondary | ICD-10-CM | POA: Insufficient documentation

## 2017-10-24 DIAGNOSIS — E119 Type 2 diabetes mellitus without complications: Secondary | ICD-10-CM | POA: Diagnosis not present

## 2017-10-24 DIAGNOSIS — Z79899 Other long term (current) drug therapy: Secondary | ICD-10-CM | POA: Diagnosis not present

## 2017-10-24 DIAGNOSIS — I10 Essential (primary) hypertension: Secondary | ICD-10-CM | POA: Diagnosis not present

## 2017-10-24 DIAGNOSIS — Z950 Presence of cardiac pacemaker: Secondary | ICD-10-CM | POA: Insufficient documentation

## 2017-10-24 DIAGNOSIS — I2510 Atherosclerotic heart disease of native coronary artery without angina pectoris: Secondary | ICD-10-CM | POA: Insufficient documentation

## 2017-10-24 DIAGNOSIS — K7460 Unspecified cirrhosis of liver: Secondary | ICD-10-CM | POA: Diagnosis not present

## 2017-10-24 DIAGNOSIS — E785 Hyperlipidemia, unspecified: Secondary | ICD-10-CM | POA: Insufficient documentation

## 2017-10-24 DIAGNOSIS — I5032 Chronic diastolic (congestive) heart failure: Secondary | ICD-10-CM | POA: Diagnosis not present

## 2017-10-24 DIAGNOSIS — M1990 Unspecified osteoarthritis, unspecified site: Secondary | ICD-10-CM | POA: Diagnosis not present

## 2017-10-24 DIAGNOSIS — D638 Anemia in other chronic diseases classified elsewhere: Secondary | ICD-10-CM | POA: Diagnosis not present

## 2017-10-24 DIAGNOSIS — D649 Anemia, unspecified: Secondary | ICD-10-CM

## 2017-10-24 DIAGNOSIS — R188 Other ascites: Secondary | ICD-10-CM

## 2017-10-24 DIAGNOSIS — J449 Chronic obstructive pulmonary disease, unspecified: Secondary | ICD-10-CM | POA: Diagnosis not present

## 2017-10-24 LAB — BASIC METABOLIC PANEL
Anion gap: 13 (ref 5–15)
BUN: 57 mg/dL — ABNORMAL HIGH (ref 6–20)
CHLORIDE: 90 mmol/L — AB (ref 101–111)
CO2: 25 mmol/L (ref 22–32)
CREATININE: 2.26 mg/dL — AB (ref 0.61–1.24)
Calcium: 8.8 mg/dL — ABNORMAL LOW (ref 8.9–10.3)
GFR calc non Af Amer: 28 mL/min — ABNORMAL LOW (ref >60)
GFR, EST AFRICAN AMERICAN: 32 mL/min — AB (ref >60)
GLUCOSE: 154 mg/dL — AB (ref 65–99)
Potassium: 3 mmol/L — ABNORMAL LOW (ref 3.5–5.1)
Sodium: 128 mmol/L — ABNORMAL LOW (ref 135–145)

## 2017-10-24 NOTE — Telephone Encounter (Signed)
Printed avs and calender of upcoming appointment. 1st unit on a Monday due to the time being a late afternoon/ patient have a hospital visit on next day. Per 4/2 los scheduled 1 month lab 3 days before MD visit as requested.

## 2017-10-25 DIAGNOSIS — D229 Melanocytic nevi, unspecified: Secondary | ICD-10-CM | POA: Diagnosis not present

## 2017-10-25 DIAGNOSIS — L821 Other seborrheic keratosis: Secondary | ICD-10-CM | POA: Diagnosis not present

## 2017-10-25 DIAGNOSIS — D1801 Hemangioma of skin and subcutaneous tissue: Secondary | ICD-10-CM | POA: Diagnosis not present

## 2017-10-27 ENCOUNTER — Telehealth (HOSPITAL_COMMUNITY): Payer: Self-pay | Admitting: Cardiology

## 2017-10-27 DIAGNOSIS — I5022 Chronic systolic (congestive) heart failure: Secondary | ICD-10-CM

## 2017-10-27 NOTE — Telephone Encounter (Signed)
Notes recorded by Kerry Dory, CMA on 10/27/2017 at 8:12 AM EDT Patient aware. Repeat labs 4/11 or patient will have labs drawn while at cancer center 4/8   ------  Notes recorded by Kerry Dory, CMA on 10/24/2017 at 4:43 PM EDT Left message for patient to call back. [phone] (H  ------  Notes recorded by Darrick Grinder D, NP on 10/24/2017 at 3:02 PM EDT Increase spiro to 25 mg twice a day. BMET in 7 days

## 2017-10-27 NOTE — Telephone Encounter (Signed)
-----   Message from Conrad Bowman, NP sent at 10/24/2017  3:02 PM EDT ----- Increase spiro to 25 mg twice a day. BMET in 7 days

## 2017-10-30 ENCOUNTER — Inpatient Hospital Stay: Payer: Medicare Other

## 2017-10-30 ENCOUNTER — Other Ambulatory Visit (HOSPITAL_COMMUNITY): Payer: Self-pay | Admitting: Cardiology

## 2017-10-30 ENCOUNTER — Other Ambulatory Visit: Payer: Self-pay

## 2017-10-30 DIAGNOSIS — K922 Gastrointestinal hemorrhage, unspecified: Secondary | ICD-10-CM | POA: Diagnosis not present

## 2017-10-30 DIAGNOSIS — I10 Essential (primary) hypertension: Secondary | ICD-10-CM | POA: Diagnosis not present

## 2017-10-30 DIAGNOSIS — D500 Iron deficiency anemia secondary to blood loss (chronic): Secondary | ICD-10-CM

## 2017-10-30 DIAGNOSIS — D509 Iron deficiency anemia, unspecified: Secondary | ICD-10-CM | POA: Diagnosis not present

## 2017-10-30 DIAGNOSIS — K7460 Unspecified cirrhosis of liver: Secondary | ICD-10-CM | POA: Diagnosis not present

## 2017-10-30 DIAGNOSIS — D638 Anemia in other chronic diseases classified elsewhere: Secondary | ICD-10-CM | POA: Diagnosis not present

## 2017-10-30 DIAGNOSIS — E119 Type 2 diabetes mellitus without complications: Secondary | ICD-10-CM | POA: Diagnosis not present

## 2017-10-30 LAB — COMPREHENSIVE METABOLIC PANEL
ALBUMIN: 3.1 g/dL — AB (ref 3.5–5.0)
ALK PHOS: 86 U/L (ref 40–150)
ALT: 16 U/L (ref 0–55)
ANION GAP: 11 (ref 3–11)
AST: 30 U/L (ref 5–34)
BILIRUBIN TOTAL: 0.9 mg/dL (ref 0.2–1.2)
BUN: 65 mg/dL — ABNORMAL HIGH (ref 7–26)
CALCIUM: 9 mg/dL (ref 8.4–10.4)
CO2: 24 mmol/L (ref 22–29)
Chloride: 93 mmol/L — ABNORMAL LOW (ref 98–109)
Creatinine, Ser: 2.59 mg/dL — ABNORMAL HIGH (ref 0.70–1.30)
GFR calc Af Amer: 27 mL/min — ABNORMAL LOW (ref >60)
GFR, EST NON AFRICAN AMERICAN: 24 mL/min — AB (ref >60)
GLUCOSE: 108 mg/dL (ref 70–140)
Potassium: 3.7 mmol/L (ref 3.5–5.1)
Sodium: 128 mmol/L — ABNORMAL LOW (ref 136–145)
TOTAL PROTEIN: 6.6 g/dL (ref 6.4–8.3)

## 2017-10-30 LAB — CBC WITH DIFFERENTIAL (CANCER CENTER ONLY)
Basophils Absolute: 0 10*3/uL (ref 0.0–0.1)
Basophils Relative: 1 %
Eosinophils Absolute: 0.1 10*3/uL (ref 0.0–0.5)
Eosinophils Relative: 2 %
HEMATOCRIT: 26.9 % — AB (ref 38.4–49.9)
HEMOGLOBIN: 8.2 g/dL — AB (ref 13.0–17.1)
LYMPHS PCT: 15 %
Lymphs Abs: 1 10*3/uL (ref 0.9–3.3)
MCH: 23.1 pg — AB (ref 27.2–33.4)
MCHC: 30.5 g/dL — AB (ref 32.0–36.0)
MCV: 75.8 fL — AB (ref 79.3–98.0)
MONOS PCT: 10 %
Monocytes Absolute: 0.7 10*3/uL (ref 0.1–0.9)
NEUTROS ABS: 5 10*3/uL (ref 1.5–6.5)
NEUTROS PCT: 72 %
Platelet Count: 141 10*3/uL (ref 140–400)
RBC: 3.55 MIL/uL — ABNORMAL LOW (ref 4.20–5.82)
RDW: 19.9 % — ABNORMAL HIGH (ref 11.0–14.6)
WBC Count: 6.8 10*3/uL (ref 4.0–10.3)

## 2017-10-30 LAB — SAMPLE TO BLOOD BANK

## 2017-10-30 LAB — PREPARE RBC (CROSSMATCH)

## 2017-10-30 MED ORDER — FUROSEMIDE 10 MG/ML IJ SOLN
INTRAMUSCULAR | Status: AC
  Filled 2017-10-30: qty 4

## 2017-10-30 MED ORDER — FUROSEMIDE 10 MG/ML IJ SOLN
40.0000 mg | Freq: Once | INTRAMUSCULAR | Status: AC
  Administered 2017-10-30: 40 mg via INTRAVENOUS

## 2017-10-30 MED ORDER — DIPHENHYDRAMINE HCL 25 MG PO CAPS
25.0000 mg | ORAL_CAPSULE | Freq: Once | ORAL | Status: AC
  Administered 2017-10-30: 25 mg via ORAL

## 2017-10-30 MED ORDER — SODIUM CHLORIDE 0.9 % IV SOLN
250.0000 mL | Freq: Once | INTRAVENOUS | Status: AC
  Administered 2017-10-30: 250 mL via INTRAVENOUS

## 2017-10-30 MED ORDER — ACETAMINOPHEN 325 MG PO TABS
ORAL_TABLET | ORAL | Status: AC
  Filled 2017-10-30: qty 2

## 2017-10-30 MED ORDER — DIPHENHYDRAMINE HCL 25 MG PO CAPS
ORAL_CAPSULE | ORAL | Status: AC
  Filled 2017-10-30: qty 1

## 2017-10-30 MED ORDER — ACETAMINOPHEN 325 MG PO TABS
650.0000 mg | ORAL_TABLET | Freq: Once | ORAL | Status: AC
  Administered 2017-10-30: 650 mg via ORAL

## 2017-10-30 NOTE — Patient Instructions (Signed)
Blood Transfusion, Adult, Care After This sheet gives you information about how to care for yourself after your procedure. Your health care provider may also give you more specific instructions. If you have problems or questions, contact your health care provider. What can I expect after the procedure? After your procedure, it is common to have:  Bruising and soreness where the IV tube was inserted.  Headache.  Follow these instructions at home:  Take over-the-counter and prescription medicines only as told by your health care provider.  Return to your normal activities as told by your health care provider.  Follow instructions from your health care provider about how to take care of your IV insertion site. Make sure you: ? Wash your hands with soap and water before you change your bandage (dressing). If soap and water are not available, use hand sanitizer. ? Change your dressing as told by your health care provider.  Check your IV insertion site every day for signs of infection. Check for: ? More redness, swelling, or pain. ? More fluid or blood. ? Warmth. ? Pus or a bad smell. Contact a health care provider if:  You have more redness, swelling, or pain around the IV insertion site.  You have more fluid or blood coming from the IV insertion site.  Your IV insertion site feels warm to the touch.  You have pus or a bad smell coming from the IV insertion site.  Your urine turns pink, red, or brown.  You feel weak after doing your normal activities. Get help right away if:  You have signs of a serious allergic or immune system reaction, including: ? Itchiness. ? Hives. ? Trouble breathing. ? Anxiety. ? Chest or lower back pain. ? Fever, flushing, and chills. ? Rapid pulse. ? Rash. ? Diarrhea. ? Vomiting. ? Dark urine. ? Serious headache. ? Dizziness. ? Stiff neck. ? Yellow coloration of the face or the white parts of the eyes (jaundice). This information is not  intended to replace advice given to you by your health care provider. Make sure you discuss any questions you have with your health care provider. Document Released: 08/01/2014 Document Revised: 03/09/2016 Document Reviewed: 01/25/2016 Elsevier Interactive Patient Education  2018 Elsevier Inc.  

## 2017-10-31 ENCOUNTER — Telehealth (HOSPITAL_COMMUNITY): Payer: Self-pay | Admitting: *Deleted

## 2017-10-31 ENCOUNTER — Other Ambulatory Visit (HOSPITAL_COMMUNITY): Payer: Self-pay | Admitting: *Deleted

## 2017-10-31 LAB — TYPE AND SCREEN
ABO/RH(D): A POS
Antibody Screen: POSITIVE
Donor AG Type: NEGATIVE
UNIT DIVISION: 0

## 2017-10-31 LAB — BPAM RBC
BLOOD PRODUCT EXPIRATION DATE: 201904252359
ISSUE DATE / TIME: 201904081521
Unit Type and Rh: 6200

## 2017-10-31 MED ORDER — POTASSIUM CHLORIDE CRYS ER 20 MEQ PO TBCR: 40.0000 meq | EXTENDED_RELEASE_TABLET | Freq: Two times a day (BID) | ORAL | 3 refills | Status: DC

## 2017-10-31 NOTE — Telephone Encounter (Signed)
Patient's wife called saying that he had labs done yesterday at the cancer center and asked if we can use those labs instead of him coming back here for a lab appointment this Thursday.  Patient was due for bmet.  Will forward to Darrick Grinder, NP to review labs and will call patient back with any changes.

## 2017-10-31 NOTE — Telephone Encounter (Signed)
Lab work looks stable. Potassium is better!  No labs tomorrow. Please call.   Thanks Montrel Donahoe [employer]

## 2017-11-01 ENCOUNTER — Other Ambulatory Visit (HOSPITAL_COMMUNITY): Payer: Self-pay | Admitting: *Deleted

## 2017-11-01 DIAGNOSIS — N186 End stage renal disease: Secondary | ICD-10-CM | POA: Diagnosis not present

## 2017-11-01 DIAGNOSIS — Z888 Allergy status to other drugs, medicaments and biological substances status: Secondary | ICD-10-CM | POA: Diagnosis not present

## 2017-11-01 DIAGNOSIS — Z992 Dependence on renal dialysis: Secondary | ICD-10-CM | POA: Diagnosis not present

## 2017-11-01 DIAGNOSIS — Z951 Presence of aortocoronary bypass graft: Secondary | ICD-10-CM | POA: Diagnosis not present

## 2017-11-01 DIAGNOSIS — K7460 Unspecified cirrhosis of liver: Secondary | ICD-10-CM | POA: Diagnosis not present

## 2017-11-01 DIAGNOSIS — Z794 Long term (current) use of insulin: Secondary | ICD-10-CM | POA: Diagnosis not present

## 2017-11-01 DIAGNOSIS — Z950 Presence of cardiac pacemaker: Secondary | ICD-10-CM | POA: Diagnosis not present

## 2017-11-01 DIAGNOSIS — Z79899 Other long term (current) drug therapy: Secondary | ICD-10-CM | POA: Diagnosis not present

## 2017-11-01 DIAGNOSIS — I509 Heart failure, unspecified: Secondary | ICD-10-CM | POA: Diagnosis not present

## 2017-11-01 DIAGNOSIS — D500 Iron deficiency anemia secondary to blood loss (chronic): Secondary | ICD-10-CM | POA: Diagnosis not present

## 2017-11-01 DIAGNOSIS — Z955 Presence of coronary angioplasty implant and graft: Secondary | ICD-10-CM | POA: Diagnosis not present

## 2017-11-01 DIAGNOSIS — Z885 Allergy status to narcotic agent status: Secondary | ICD-10-CM | POA: Diagnosis not present

## 2017-11-01 DIAGNOSIS — I4891 Unspecified atrial fibrillation: Secondary | ICD-10-CM | POA: Diagnosis not present

## 2017-11-01 DIAGNOSIS — J449 Chronic obstructive pulmonary disease, unspecified: Secondary | ICD-10-CM | POA: Diagnosis not present

## 2017-11-01 DIAGNOSIS — K3189 Other diseases of stomach and duodenum: Secondary | ICD-10-CM | POA: Diagnosis not present

## 2017-11-01 DIAGNOSIS — E785 Hyperlipidemia, unspecified: Secondary | ICD-10-CM | POA: Diagnosis not present

## 2017-11-01 DIAGNOSIS — I132 Hypertensive heart and chronic kidney disease with heart failure and with stage 5 chronic kidney disease, or end stage renal disease: Secondary | ICD-10-CM | POA: Diagnosis not present

## 2017-11-01 DIAGNOSIS — G4730 Sleep apnea, unspecified: Secondary | ICD-10-CM | POA: Diagnosis not present

## 2017-11-01 DIAGNOSIS — E1122 Type 2 diabetes mellitus with diabetic chronic kidney disease: Secondary | ICD-10-CM | POA: Diagnosis not present

## 2017-11-01 DIAGNOSIS — I2510 Atherosclerotic heart disease of native coronary artery without angina pectoris: Secondary | ICD-10-CM | POA: Diagnosis not present

## 2017-11-01 DIAGNOSIS — K5520 Angiodysplasia of colon without hemorrhage: Secondary | ICD-10-CM | POA: Diagnosis not present

## 2017-11-01 MED ORDER — METOPROLOL SUCCINATE ER 25 MG PO TB24: 25.0000 mg | ORAL_TABLET | Freq: Two times a day (BID) | ORAL | 3 refills | Status: DC

## 2017-11-01 NOTE — Telephone Encounter (Signed)
Called and left detailed message letting them know labs are stable and lab appt tomorrow has been cancelled.

## 2017-11-02 ENCOUNTER — Other Ambulatory Visit (HOSPITAL_COMMUNITY): Payer: Medicare Other

## 2017-11-06 ENCOUNTER — Other Ambulatory Visit (HOSPITAL_COMMUNITY): Payer: Self-pay | Admitting: *Deleted

## 2017-11-06 ENCOUNTER — Telehealth (HOSPITAL_COMMUNITY): Payer: Self-pay | Admitting: *Deleted

## 2017-11-06 DIAGNOSIS — R188 Other ascites: Secondary | ICD-10-CM

## 2017-11-06 NOTE — Telephone Encounter (Signed)
Patient called requesting to be scheduled for a paracentesis at [facility].  Patient scheduled for 11/08/17 @ 1:15.  Patient has to arrive by 12:45 and he is aware.  Order placed.

## 2017-11-06 NOTE — Progress Notes (Signed)
[name]:  Assessment: Symptomatic anemia 72 y.o. male with extensive comorbidities including congestive heart failure, replaced aortic valve, hepatic cirrhosis and history of gastrointestinal bleeding due to portal gastropathy as well as newly discovered small bowel AVM.  My evaluation is that of a complex anemia including elements of anemia due to iron deficiency due to chronic gastrointestinal blood loss, anemia of chronic disease, anemia due to hepatic dysfunction of cirrhosis, and possibly a component of anemia due to a decreased erythropoietin production due to chronic renal disease.  Interim, patient had recurrent GI bleeding episodes with repeat development of symptomatic anemia.  Hemoglobin is somewhat stable at the present time, but patient remains symptomatic.  Plan: --continue oral iron supplementation. --Transfuse 1 unit of packed red blood cells today. --Weekly CBC with transfusion of 1 unit of packed red blood cells for hemoglobin of 8.5 or lower with torsemide administration to reduce the volume burden --RTC 1 month for hematological monitoring: Labs 2-3 days prior, clinic [name], possible intravenous iron administration.  Voice recognition software was used and creation of this note. Despite my best effort at editing the text, some misspelling/errors may have occurred.   Orders Placed This Encounter  Procedures  . CBC with Differential (Cancer Center Only)    Standing Status:   Standing    Number of Occurrences:   25    Standing Expiration Date:   10/25/2018  . CBC with Differential (Cancer Center Only)    Standing Status:   Future    Standing Expiration Date:   10/25/2018  . CMP ([place] only)    Standing Status:   Future    Standing Expiration Date:   10/25/2018  . Iron and TIBC    Standing Status:   Future    Standing Expiration Date:   10/25/2018  . Ferritin    Standing Status:   Future    Standing Expiration Date:   10/25/2018   . Practitioner attestation of consent    I, the ordering practitioner, attest that I have discussed with the patient the benefits, risks, side effects, alternatives, likelihood of achieving goals and potential problems during recovery for the procedure listed.    Standing Status:   Standing    Number of Occurrences:   25    Standing Expiration Date:   10/25/2018    Order Specific Question:   Procedure    Answer:   Blood Product(s)  . Complete patient signature process for consent form    Standing Status:   Standing    Number of Occurrences:   25    Standing Expiration Date:   10/25/2018  . Care order/instruction    Transfuse Parameters Transfuse 1 unit pRBC for Hgb 8.5 or below    Standing Status:   Standing    Number of Occurrences:   25    Standing Expiration Date:   10/25/2018  . Sample to Blood Bank    Standing Status:   Standing    Number of Occurrences:   25    Standing Expiration Date:   10/25/2018    All questions were answered.  . The patient knows to call the clinic with any problems, questions or concerns.  This note was electronically signed.    History of Presenting Illness [name] is a 73 y.o. male followed in the [place] for thrombocytopenia and anemia. 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 Please see the results of the recent lab work below for details.  Since the last [name] to the clinic, patient has had at least 3 visits to the emergency room with symptoms of gastrointestinal bleeding.  He was admitted on 08/24/17 through 08/27/17 after presenting with increased fatigue and dyspnea on exertion following a weeklong bout of melena and presentation hemoglobin of 7.0.  Underwent EGD on 08/25/17 showing distal esophageal grade 1 varices  moderate gastropathy few gastric fundal polyps with out any evidence of bleeding and normal D12.  Subsequently, underwent a capsule endoscopy on 08/26/17 demonstrating small bowel AVMs with bleeding.  Stabilized and discharged, readmitted on 09/07/17 through 09/09/17 with hemoglobin 7.2, received 2 units of packed red blood cells and discharged.  Admitted on 09/26/17 to 09/30/17 with hemoglobin 7.3 and was planned to be transferred to [facility] for additional assessment, but instead was stabilized and discharged.  Currently is planned to be assessed by [facility] on 11/01/17.  Continues to feel weak and tired.  No recurrent melena or hematochezia.  Oncological/hematological History: --Labs, 01/14/16: Hgb 11.2, MCV 82.4, MCH 25.3, RDW 18.5, Plt 180;  --Labs, 06/28/16: Hgb 10.7, MCV 82.5, MCH 25.8, RDW 17.9, Plt 164;  --Colonoscopy/EGD, Apr 2018: Grade 1 esophageal varices. 0.7 cm sessile polyp in the gastric body with active bleeding, 2 polyps in the prepyloric stomach with evidence of bleeding. Gastropathy with vascular ectasia. Colonoscopy significant for splenic flexure lipoma and external hemorrhoids without evidence of active bleeding. --Labs, 10/26/16: Hgb 10.2, MCV 82.7, MCH 25.2, RDW 18.7, Plt 117;  --Labs, 02/21/17: Hgb   9.6, MCV 88.3, MCH 27.5, RDW 18.6, Plt 137;  --Labs, 04/04/17: Hgb   9.5, MCV 83.9, MCH 25.1, RDW 18.0, Plt 188; Cr 2.52 --Labs, 04/11/17: Hgb 10.0, MCV 83.2, MCH 25.1, RDW 17.5, Plt 167; Fe 110, FeSat 22%, TIBC 508, Ferritin 43, Folate >20, Vit B12 1167; Haptoglobin 213; TSH 3.24 --Ferrous sulfate 345m 2-3/day --Labs, 07/24/17: Hgb   9.2, MCV 87.4, MCH 26.4, RDW     ..., Plt 138; Fe 115, FeSat 22%, TIBC 533, Ferritin 53  --Transfusion, Jan 2019: pRBC x4 units  --Transfusion, Feb 2019: pRBC x4 units  --Transfusion, Mar 2019: pRBC x3 units --Labs, 10/17/17: Hgb   8.4, MCV 77.6, MCH 24.6, MCHC 31.7, RDW 19.6, Plt 181; Fe 25, FeSat 6%,  TIBC 401, Ferritin 25; Epo 114  Medical History: Past Medical History:  Diagnosis Date  . Aortic stenosis 03/08/2012   a.  s/p tissue AVR 10/13 with Dr. HRoxan Hockey   b. Echo 10/13: mod LVH, EF 55-60%, tissue AVR not well seen, no leak, gradient not too high (mean 138mg), MAC, mild MR, mild LAE, PASP 38  . Ascites    status post paracentesis with removal of 3.4 L of ascitic fluid  . Atrial fibrillation ([facility]   Permanent; off of Coumadin for now due to GI bleed  . AVM (arteriovenous malformation)    Recurrent GI bleeding requiring multiple transfusions  . CAD (coronary artery disease)    a. s/p CABG 2004;  b. [place]/13:  LHC 8/13: Free radial to obtuse marginal patent, SVG-diagonal patent, LIMA-LAD patent, EF 65-70%, mean aortic valve gradient 42  . Carotid bruit 06/14/2011   a. pre-AVR dopplers 10/13: no sig ICA stenosis  . Chronic diastolic heart failure ([facility]  . Chronic kidney disease   . Cirrhosis ([facility]  .  COPD (chronic obstructive pulmonary disease) ([place])   . Depression   . DM2 (diabetes mellitus, type 2) (HCC)    at least 10 yrs  . Dyspnea   . GERD (gastroesophageal reflux disease)   . Gout   . H/O hiatal hernia   . Headache   . Hyperlipidemia   . Hyperlipidemia   . Hypertension    x 15 yrs  . Insomnia   . Iron deficiency anemia    Requiring intravenous iron  . Mediastinal adenopathy 09/22/2011  . OSA (obstructive sleep apnea) 1999    USES CPAP  . Osteoarthritis   . Overweight(278.02)   . Presence of permanent cardiac pacemaker   . Thrombocytopenia [facility])     Surgical History: Past Surgical History:  Procedure Laterality Date  . ABLATION  04-18-14   AVN ablation by Dr Caryl Comes  . AORTIC VALVE REPLACEMENT  04/25/2012   Procedure: AORTIC VALVE REPLACEMENT (AVR);  Surgeon: Melrose Nakayama, MD;  Location: [facility];  Service: Open Heart Surgery;  Laterality: N/A;  . AV NODE ABLATION N/A 04/18/2014   Procedure: AV NODE ABLATION;  Surgeon: Deboraha Sprang, MD;   Location: [facility] CATH LAB;  Service: Cardiovascular;  Laterality: N/A;  . CARPAL TUNNEL RELEASE    10/08/2003  . CATARACT EXTRACTION W/ INTRAOCULAR LENS IMPLANT Bilateral 09/28/2015 , 10/19/2015  . COLONOSCOPY WITH PROPOFOL N/A 10/28/2016   Procedure: COLONOSCOPY WITH PROPOFOL;  Surgeon: Rogene Houston, MD;  [facility];  Service: Endoscopy;  Laterality: N/A;  . CORONARY ANGIOPLASTY WITH STENT PLACEMENT  01/19/2005   drug eluting stent to high grade ostial stenosis of radial artery graft to OM  . CORONARY ARTERY BYPASS GRAFT   10/15/2002    [name]. [name], M.D.     . ESOPHAGOGASTRODUODENOSCOPY N/A 07/31/2013   Procedure: ESOPHAGOGASTRODUODENOSCOPY (EGD);  Surgeon: Milus Banister, MD;  Location: [facility];  Service: Endoscopy;  Laterality: N/A;  . ESOPHAGOGASTRODUODENOSCOPY Left 01/11/2014   Procedure: ESOPHAGOGASTRODUODENOSCOPY (EGD);  Surgeon: Juanita Craver, MD;  Location[facility] ENDOSCOPY;  Service: Endoscopy;  Laterality: Left;  . ESOPHAGOGASTRODUODENOSCOPY (EGD) WITH PROPOFOL N/A 10/28/2016   Procedure: ESOPHAGOGASTRODUODENOSCOPY (EGD) WITH PROPOFOL;  Surgeon: Rogene Houston, MD;  [facility];  Service: Endoscopy;  Laterality: N/A;  . ESOPHAGOGASTRODUODENOSCOPY (EGD) WITH PROPOFOL N/A 08/25/2017   Procedure: ESOPHAGOGASTRODUODENOSCOPY (EGD) WITH PROPOFOL;  Surgeon: Rogene Houston, MD;  [facility];  Service: Endoscopy;  Laterality: N/A;  . GIVENS CAPSULE STUDY N/A 08/26/2017   Procedure: GIVENS CAPSULE STUDY;  Surgeon: Danie Binder, MD;  [facility];  Service: Endoscopy;  Laterality: N/A;  . HERNIA REPAIR    . IR PARACENTESIS  09/14/2017  . KNEE ARTHROSCOPY Right 01/20/2016   Procedure: ARTHROSCOPY RIGHT KNEE WITH MENSICAL DEBRIDEMENT;  Surgeon: Gaynelle Arabian, MD;  [facility]: [facility];  Service: Orthopedics;  Laterality: Right;  . LEFT AND RIGHT HEART CATHETERIZATION WITH CORONARY/GRAFT ANGIOGRAM N/A 03/07/2012   Procedure: LEFT AND RIGHT HEART  CATHETERIZATION WITH Beatrix Fetters;  Surgeon: Sherren Mocha, MD;  Location: [facility] CATH LAB;  Service: Cardiovascular;  Laterality: N/A;  . lipoma surgery    . OTHER SURGICAL HISTORY  08/26/2011   [facility],  enteroscopy , revealing "three-four AVMs."   . PACEMAKER INSERTION  03-24-14   STJ Assurity single chamber pacemaker implanted by Dr Caryl Comes  . PARACENTESIS  12/2015  . PERMANENT PACEMAKER INSERTION N/A 03/24/2014   Procedure: PERMANENT PACEMAKER INSERTION;  Surgeon: Deboraha Sprang, MD;  Location: [facility] CATH LAB;  Service: Cardiovascular;  Laterality: N/A;  . POLYPECTOMY  10/28/2016   Procedure: POLYPECTOMY;  Surgeon: Rogene Houston, MD;  [facility];  Service: Endoscopy;;  gastric  . RIGHT HEART CATHETERIZATION N/A 01/01/2014   Procedure: RIGHT HEART CATH;  Surgeon: Jolaine Artist, MD;  Location: [facility] CATH LAB;  Service: Cardiovascular;  Laterality: N/A;  . TEE WITHOUT CARDIOVERSION  03/07/2012   Procedure: TRANSESOPHAGEAL ECHOCARDIOGRAM (TEE);  Surgeon: Thayer Headings, MD;  Location: [facility] ENDOSCOPY;  Service: Cardiovascular;  Laterality: N/A;    Family History: Family History  Problem Relation Age of Onset  . Hypertension Father   . Diabetes Father   . Heart disease Father   . COPD Sister   . Cancer Maternal Aunt        Breast cancer   . Cancer Maternal Aunt        Breast cancer   . Diabetes Son   . Cancer Daughter        Cervical cancer  . Coronary artery disease Unknown        FAMILY HISTORY    Social History: Social History   Socioeconomic History  . Marital status: Married    Spouse name: Not on file  . Number of children: 3  . Years of education: Not on file  . Highest education level: Not on file  Occupational History    Comment: Associate Professor at [employer]  . Financial resource strain: Not on file  . Food insecurity:    Worry: Not on file    Inability: Not on file  . Transportation needs:    Medical: Not on file    Non-medical: Not  on file  Tobacco Use  . Smoking status: Former Smoker    Packs/day: 1.00    Years: 30.00    Pack years: 30.00    Types: Cigarettes    Last attempt to quit: 07/25/2000    Years since quitting: 17.2  . Smokeless tobacco: Never Used  . Tobacco comment: Quit smoking in 2002  Substance and Sexual Activity  . Alcohol use: No    Alcohol/week: 0.6 oz    Types: 1 Shots of liquor per week    Comment: stopped drinking alcohol  . Drug use: No  . Sexual activity: Yes  Lifestyle  . Physical activity:    Days per week: Not on file    Minutes per session: Not on file  . Stress: Not on file  Relationships  . Social connections:    Talks on phone: Not on file    Gets together: Not on file    Attends religious service: Not on file    Active member of club or organization: Not on file    Attends meetings of clubs or organizations: Not on file    Relationship status: Not on file  . Intimate partner violence:    Fear of current or ex partner: Not on file    Emotionally abused: Not on file    Physically abused: Not on file    Forced sexual activity: Not on file  Other Topics Concern  . Not on file  Social History Narrative   Lives in [place], [place] with wife.     Allergies: Allergies  Allergen Reactions  . Codeine Other (See Comments) and Palpitations    Hurting in chest  . Diltiazem Hcl Itching  . Niacin Other (See Comments)    Felt a "severe burning sensation" Hot flashes  . Aspirin  History of Bleeding ulcers   . Diltiazem Hcl Other (See Comments) and Hives    Gets hot    Medications:  Current Outpatient Medications  Medication Sig Dispense Refill  . acetaminophen (TYLENOL) 500 MG tablet Take 1,000 mg by mouth at bedtime as needed for mild pain or headache.     . allopurinol (ZYLOPRIM) 100 MG tablet Take 1 tablet (100 mg total) by mouth daily. 90 tablet 3  . atorvastatin (LIPITOR) 20 MG tablet Take 20 mg by mouth daily at 6 PM.     . Chlorpheniramine Maleate (ALLERGY PO)  Take 1 tablet by mouth daily.    [name] gabapentin (NEURONTIN) 300 MG capsule Take 300 mg by mouth 3 (three) times daily.  6  . insulin NPH Human (HUMULIN N,NOVOLIN N) 100 UNIT/ML injection Inject 65 Units into the skin 2 (two) times daily before a meal.     . insulin regular (NOVOLIN R,HUMULIN R) 100 units/mL injection Inject 30 Units into the skin 2 (two) times daily before a meal.     . metolazone (ZAROXOLYN) 5 MG tablet Take 1 tablet (5 mg total) by mouth every other day. 45 tablet 3  . metoprolol succinate (TOPROL-XL) 25 MG 24 hr tablet Take 1 tablet (25 mg total) by mouth 2 (two) times daily. 60 tablet 3  . Multiple Vitamins-Minerals ([employer] SPECTRAVITE ADULT 50+ PO) Take 1 tablet by mouth daily.    . pantoprazole (PROTONIX) 40 MG tablet Take 1 tablet (40 mg total) by mouth 2 (two) times daily before a meal. 60 tablet 0  . potassium chloride SA (K-DUR,KLOR-CON) 20 MEQ tablet Take 2 tablets (40 mEq total) by mouth 2 (two) times daily. 360 tablet 3  . potassium chloride SA (KLOR-CON M20) 20 MEQ tablet Take 2 tablets (40 mEq total) by mouth 2 (two) times daily. 120 tablet 3  . PROAIR HFA 108 (90 Base) MCG/ACT inhaler Take 2 puffs by mouth 2 (two) times daily.    . sertraline (ZOLOFT) 50 MG tablet Take 1 tablet (50 mg total) by mouth daily. 90 tablet 3  . spironolactone (ALDACTONE) 25 MG tablet Take 1 tablet (25 mg total) by mouth daily. 90 tablet 3  . tamsulosin (FLOMAX) 0.4 MG CAPS capsule TAKE ONE CAPSULE BY MOUTH EVERY DAY AFTER SUPPER 90 capsule 3  . torsemide (DEMADEX) 20 MG tablet Take 6 tablets (120 mg total) by mouth 2 (two) times daily. 360 tablet 3  . vitamin C (ASCORBIC ACID) 500 MG tablet Take 500 mg by mouth 2 (two) times daily.     No current facility-administered medications for this [name].     Review of Systems: Review of Systems  Respiratory: Positive for shortness of breath.   All other systems reviewed and are negative.    PHYSICAL EXAMINATION Blood pressure (!) 103/51,  pulse 76, temperature 97.8 F (36.6 C), resp. rate (!) 24, height _0  (1.727 m), weight 264 lb 8 oz (120 kg), SpO2 98 %.  ECOG PERFORMANCE STATUS: 1 - Symptomatic but completely ambulatory  Physical Exam  Constitutional: He is oriented to person, place, and time and well-developed, well-nourished, and in no distress.  HENT:  Head: Normocephalic and atraumatic.  Mouth/Throat: Oropharynx is clear and moist and mucous membranes are normal.  Eyes: Pupils are equal, round, and reactive to light. Conjunctivae and EOM are normal. No scleral icterus.  Neck: No thyromegaly present.  Cardiovascular: Normal rate and regular rhythm.  Murmur heard. 2/6 early systolic murmur appreciated over the base of the  heart  Pulmonary/Chest: Effort normal and breath sounds normal. No respiratory distress. He has no wheezes.  Abdominal: Soft. Bowel sounds are normal. He exhibits no distension and no mass. There is no tenderness.  Musculoskeletal: He exhibits no edema.  Lymphadenopathy:    He has no cervical adenopathy.  Neurological: He is alert and oriented to person, place, and time. No cranial nerve deficit.  Skin: Skin is warm and dry. No rash noted. No erythema. No pallor.     LABORATORY DATA: I have personally reviewed the data as listed: Hospital Outpatient [name] on 10/24/2017  Component Date Value Ref Range Status  . Sodium 10/24/2017 128* 135 - 145 mmol/L Final  . Potassium 10/24/2017 3.0* 3.5 - 5.1 mmol/L Final  . Chloride 10/24/2017 90* 101 - 111 mmol/L Final  . CO2 10/24/2017 25  22 - 32 mmol/L Final  . Glucose, Bld 10/24/2017 154* 65 - 99 mg/dL Final  . BUN 10/24/2017 57* 6 - 20 mg/dL Final  . Creatinine, Ser 10/24/2017 2.26* 0.61 - 1.24 mg/dL Final  . Calcium 10/24/2017 8.8* 8.9 - 10.3 mg/dL Final  . GFR calc non Af Amer 10/24/2017 28* >60 mL/min Final  . GFR calc Af Amer 10/24/2017 32* >60 mL/min Final   Comment: (NOTE) The eGFR has been calculated using the CKD EPI equation. This  calculation has not been validated in all clinical situations. eGFR's persistently <60 mL/min signify possible Chronic Kidney Disease.   Georgiann Hahn gap 10/24/2017 13  5 - 15 Final   Performed at [facility], [address], [place], [place] [address]       Ardath Sax, MD

## 2017-11-06 NOTE — Assessment & Plan Note (Signed)
72 y.o. male with extensive comorbidities including congestive heart failure, replaced aortic valve, hepatic cirrhosis and history of gastrointestinal bleeding due to portal gastropathy as well as newly discovered small bowel AVM.  My evaluation is that of a complex anemia including elements of anemia due to iron deficiency due to chronic gastrointestinal blood loss, anemia of chronic disease, anemia due to hepatic dysfunction of cirrhosis, and possibly a component of anemia due to a decreased erythropoietin production due to chronic renal disease.  Interim, patient had recurrent GI bleeding episodes with repeat development of symptomatic anemia.  Hemoglobin is somewhat stable at the present time, but patient remains symptomatic.  Plan: --continue oral iron supplementation. --Transfuse 1 unit of packed red blood cells today. --Weekly CBC with transfusion of 1 unit of packed red blood cells for hemoglobin of 8.5 or lower with torsemide administration to reduce the volume burden --RTC 1 month for hematological monitoring: Labs 2-3 days prior, clinic visit, possible intravenous iron administration.

## 2017-11-07 ENCOUNTER — Other Ambulatory Visit: Payer: Self-pay

## 2017-11-07 ENCOUNTER — Inpatient Hospital Stay: Payer: Medicare Other

## 2017-11-07 DIAGNOSIS — K922 Gastrointestinal hemorrhage, unspecified: Secondary | ICD-10-CM | POA: Diagnosis not present

## 2017-11-07 DIAGNOSIS — E119 Type 2 diabetes mellitus without complications: Secondary | ICD-10-CM | POA: Diagnosis not present

## 2017-11-07 DIAGNOSIS — D509 Iron deficiency anemia, unspecified: Secondary | ICD-10-CM | POA: Diagnosis not present

## 2017-11-07 DIAGNOSIS — I10 Essential (primary) hypertension: Secondary | ICD-10-CM | POA: Diagnosis not present

## 2017-11-07 DIAGNOSIS — D649 Anemia, unspecified: Secondary | ICD-10-CM

## 2017-11-07 DIAGNOSIS — D500 Iron deficiency anemia secondary to blood loss (chronic): Secondary | ICD-10-CM

## 2017-11-07 DIAGNOSIS — K7460 Unspecified cirrhosis of liver: Secondary | ICD-10-CM | POA: Diagnosis not present

## 2017-11-07 DIAGNOSIS — D638 Anemia in other chronic diseases classified elsewhere: Secondary | ICD-10-CM | POA: Diagnosis not present

## 2017-11-07 LAB — CBC WITH DIFFERENTIAL (CANCER CENTER ONLY)
BASOS PCT: 2 %
Basophils Absolute: 0.1 10*3/uL (ref 0.0–0.1)
EOS ABS: 0.1 10*3/uL (ref 0.0–0.5)
Eosinophils Relative: 2 %
HCT: 26.6 % — ABNORMAL LOW (ref 38.4–49.9)
Hemoglobin: 8.3 g/dL — ABNORMAL LOW (ref 13.0–17.1)
Lymphocytes Relative: 8 %
Lymphs Abs: 0.5 10*3/uL — ABNORMAL LOW (ref 0.9–3.3)
MCH: 23.1 pg — ABNORMAL LOW (ref 27.2–33.4)
MCHC: 31.2 g/dL — ABNORMAL LOW (ref 32.0–36.0)
MCV: 74.1 fL — ABNORMAL LOW (ref 79.3–98.0)
MONO ABS: 0.6 10*3/uL (ref 0.1–0.9)
MONOS PCT: 9 %
Neutro Abs: 5.1 10*3/uL (ref 1.5–6.5)
Neutrophils Relative %: 79 %
PLATELETS: 180 10*3/uL (ref 140–400)
RBC: 3.6 MIL/uL — AB (ref 4.20–5.82)
RDW: 23.1 % — AB (ref 11.0–14.6)
WBC: 6.5 10*3/uL (ref 4.0–10.3)

## 2017-11-07 LAB — SAMPLE TO BLOOD BANK

## 2017-11-07 LAB — PREPARE RBC (CROSSMATCH)

## 2017-11-07 MED ORDER — FUROSEMIDE 10 MG/ML IJ SOLN
20.0000 mg | Freq: Once | INTRAMUSCULAR | Status: AC
  Administered 2017-11-07: 20 mg via INTRAVENOUS

## 2017-11-07 MED ORDER — SODIUM CHLORIDE 0.9% FLUSH
3.0000 mL | INTRAVENOUS | Status: DC | PRN
  Filled 2017-11-07: qty 10

## 2017-11-07 MED ORDER — ACETAMINOPHEN 325 MG PO TABS
650.0000 mg | ORAL_TABLET | Freq: Once | ORAL | Status: AC
  Administered 2017-11-07: 650 mg via ORAL

## 2017-11-07 MED ORDER — ACETAMINOPHEN 325 MG PO TABS
ORAL_TABLET | ORAL | Status: AC
  Filled 2017-11-07: qty 2

## 2017-11-07 MED ORDER — FUROSEMIDE 10 MG/ML IJ SOLN
INTRAMUSCULAR | Status: AC
  Filled 2017-11-07: qty 2

## 2017-11-07 MED ORDER — SODIUM CHLORIDE 0.9% FLUSH
10.0000 mL | INTRAVENOUS | Status: DC | PRN
  Filled 2017-11-07: qty 10

## 2017-11-07 MED ORDER — SODIUM CHLORIDE 0.9 % IV SOLN: 250.0000 mL | Freq: Once | INTRAVENOUS | Status: DC

## 2017-11-07 MED ORDER — HEPARIN SOD (PORK) LOCK FLUSH 100 UNIT/ML IV SOLN
250.0000 [IU] | INTRAVENOUS | Status: DC | PRN
  Filled 2017-11-07: qty 5

## 2017-11-07 MED ORDER — DIPHENHYDRAMINE HCL 25 MG PO CAPS
ORAL_CAPSULE | ORAL | Status: AC
  Filled 2017-11-07: qty 1

## 2017-11-07 MED ORDER — DIPHENHYDRAMINE HCL 25 MG PO CAPS
25.0000 mg | ORAL_CAPSULE | Freq: Once | ORAL | Status: AC
  Administered 2017-11-07: 25 mg via ORAL

## 2017-11-07 NOTE — Patient Instructions (Signed)

## 2017-11-08 ENCOUNTER — Ambulatory Visit (HOSPITAL_COMMUNITY)
Admission: RE | Admit: 2017-11-08 | Discharge: 2017-11-08 | Disposition: A | Discharge status: Home / Self Care | Payer: Medicare Other | Source: Ambulatory Visit | Attending: Cardiology | Admitting: Cardiology

## 2017-11-08 ENCOUNTER — Ambulatory Visit (HOSPITAL_COMMUNITY): Payer: Medicare Other

## 2017-11-08 ENCOUNTER — Encounter (HOSPITAL_COMMUNITY): Payer: Medicare Other

## 2017-11-08 ENCOUNTER — Encounter (HOSPITAL_COMMUNITY): Payer: Self-pay

## 2017-11-08 DIAGNOSIS — R188 Other ascites: Secondary | ICD-10-CM | POA: Diagnosis not present

## 2017-11-08 LAB — TYPE AND SCREEN
ABO/RH(D): A POS
Antibody Screen: POSITIVE
DONOR AG TYPE: NEGATIVE
UNIT DIVISION: 0

## 2017-11-08 LAB — BPAM RBC
Blood Product Expiration Date: 201904302359
ISSUE DATE / TIME: 201904161522
Unit Type and Rh: 5100

## 2017-11-08 NOTE — Progress Notes (Signed)
Paracentesis complete no signs of distress, 7L ascites removed.

## 2017-11-08 NOTE — Procedures (Signed)
   [place] RLQ Paracentesis  7 L dark yellow fluid  Tolerated well

## 2017-11-12 ENCOUNTER — Other Ambulatory Visit (HOSPITAL_COMMUNITY): Payer: Self-pay | Admitting: Cardiology

## 2017-11-14 ENCOUNTER — Inpatient Hospital Stay: Payer: Medicare Other

## 2017-11-14 ENCOUNTER — Other Ambulatory Visit: Payer: Self-pay | Admitting: Hematology and Oncology

## 2017-11-14 ENCOUNTER — Other Ambulatory Visit: Payer: Self-pay

## 2017-11-14 DIAGNOSIS — D649 Anemia, unspecified: Secondary | ICD-10-CM

## 2017-11-14 DIAGNOSIS — I10 Essential (primary) hypertension: Secondary | ICD-10-CM | POA: Diagnosis not present

## 2017-11-14 DIAGNOSIS — D509 Iron deficiency anemia, unspecified: Secondary | ICD-10-CM | POA: Diagnosis not present

## 2017-11-14 DIAGNOSIS — K922 Gastrointestinal hemorrhage, unspecified: Secondary | ICD-10-CM | POA: Diagnosis not present

## 2017-11-14 DIAGNOSIS — D638 Anemia in other chronic diseases classified elsewhere: Secondary | ICD-10-CM | POA: Diagnosis not present

## 2017-11-14 DIAGNOSIS — D500 Iron deficiency anemia secondary to blood loss (chronic): Secondary | ICD-10-CM

## 2017-11-14 DIAGNOSIS — E119 Type 2 diabetes mellitus without complications: Secondary | ICD-10-CM | POA: Diagnosis not present

## 2017-11-14 DIAGNOSIS — K7460 Unspecified cirrhosis of liver: Secondary | ICD-10-CM | POA: Diagnosis not present

## 2017-11-14 LAB — CBC WITH DIFFERENTIAL (CANCER CENTER ONLY)
Basophils Absolute: 0 K/uL (ref 0.0–0.1)
Basophils Relative: 1 %
Eosinophils Absolute: 0.1 K/uL (ref 0.0–0.5)
Eosinophils Relative: 2 %
HCT: 28.5 % — ABNORMAL LOW (ref 38.4–49.9)
Hemoglobin: 8.5 g/dL — ABNORMAL LOW (ref 13.0–17.1)
Lymphocytes Relative: 8 %
Lymphs Abs: 0.6 K/uL — ABNORMAL LOW (ref 0.9–3.3)
MCH: 23.2 pg — ABNORMAL LOW (ref 27.2–33.4)
MCHC: 29.8 g/dL — ABNORMAL LOW (ref 32.0–36.0)
MCV: 77.7 fL — ABNORMAL LOW (ref 79.3–98.0)
Monocytes Absolute: 0.9 K/uL (ref 0.1–0.9)
Monocytes Relative: 12 %
Neutro Abs: 5.7 K/uL (ref 1.5–6.5)
Neutrophils Relative %: 77 %
Platelet Count: 165 K/uL (ref 140–400)
RBC: 3.67 MIL/uL — ABNORMAL LOW (ref 4.20–5.82)
RDW: 21.5 % — ABNORMAL HIGH (ref 11.0–14.6)
WBC Count: 7.3 K/uL (ref 4.0–10.3)

## 2017-11-14 LAB — SAMPLE TO BLOOD BANK

## 2017-11-14 LAB — PREPARE RBC (CROSSMATCH)

## 2017-11-14 MED ORDER — SODIUM CHLORIDE 0.9 % IV SOLN
250.0000 mL | Freq: Once | INTRAVENOUS | Status: AC
  Administered 2017-11-14: 250 mL via INTRAVENOUS

## 2017-11-14 MED ORDER — ACETAMINOPHEN 325 MG PO TABS
650.0000 mg | ORAL_TABLET | Freq: Once | ORAL | Status: AC
  Administered 2017-11-14: 650 mg via ORAL

## 2017-11-14 MED ORDER — ACETAMINOPHEN 325 MG PO TABS
ORAL_TABLET | ORAL | Status: AC
  Filled 2017-11-14: qty 2

## 2017-11-14 MED ORDER — HEPARIN SOD (PORK) LOCK FLUSH 100 UNIT/ML IV SOLN
500.0000 [IU] | Freq: Every day | INTRAVENOUS | Status: DC | PRN
  Filled 2017-11-14: qty 5

## 2017-11-14 MED ORDER — SODIUM CHLORIDE 0.9% FLUSH
10.0000 mL | INTRAVENOUS | Status: DC | PRN
  Filled 2017-11-14: qty 10

## 2017-11-14 MED ORDER — DIPHENHYDRAMINE HCL 25 MG PO CAPS
ORAL_CAPSULE | ORAL | Status: AC
  Filled 2017-11-14: qty 1

## 2017-11-14 MED ORDER — DIPHENHYDRAMINE HCL 25 MG PO CAPS
25.0000 mg | ORAL_CAPSULE | Freq: Once | ORAL | Status: AC
  Administered 2017-11-14: 25 mg via ORAL

## 2017-11-14 NOTE — Patient Instructions (Signed)

## 2017-11-15 LAB — BPAM RBC
Blood Product Expiration Date: 201905272359
ISSUE DATE / TIME: 201904231225
UNIT TYPE AND RH: 5100

## 2017-11-15 LAB — TYPE AND SCREEN
ABO/RH(D): A POS
ANTIBODY SCREEN: POSITIVE
DONOR AG TYPE: NEGATIVE
Unit division: 0

## 2017-11-17 ENCOUNTER — Other Ambulatory Visit: Payer: Self-pay | Admitting: Hematology and Oncology

## 2017-11-17 ENCOUNTER — Other Ambulatory Visit: Payer: Self-pay

## 2017-11-17 ENCOUNTER — Inpatient Hospital Stay: Payer: Medicare Other

## 2017-11-17 DIAGNOSIS — E119 Type 2 diabetes mellitus without complications: Secondary | ICD-10-CM | POA: Diagnosis not present

## 2017-11-17 DIAGNOSIS — D638 Anemia in other chronic diseases classified elsewhere: Secondary | ICD-10-CM | POA: Diagnosis not present

## 2017-11-17 DIAGNOSIS — D649 Anemia, unspecified: Secondary | ICD-10-CM

## 2017-11-17 DIAGNOSIS — D509 Iron deficiency anemia, unspecified: Secondary | ICD-10-CM | POA: Diagnosis not present

## 2017-11-17 DIAGNOSIS — K922 Gastrointestinal hemorrhage, unspecified: Secondary | ICD-10-CM | POA: Diagnosis not present

## 2017-11-17 DIAGNOSIS — I10 Essential (primary) hypertension: Secondary | ICD-10-CM | POA: Diagnosis not present

## 2017-11-17 DIAGNOSIS — K7460 Unspecified cirrhosis of liver: Secondary | ICD-10-CM | POA: Diagnosis not present

## 2017-11-17 LAB — CBC WITH DIFFERENTIAL (CANCER CENTER ONLY)
BASOS PCT: 1 %
Basophils Absolute: 0.1 10*3/uL (ref 0.0–0.1)
EOS ABS: 0.2 10*3/uL (ref 0.0–0.5)
EOS PCT: 2 %
HCT: 29.4 % — ABNORMAL LOW (ref 38.4–49.9)
Hemoglobin: 9.3 g/dL — ABNORMAL LOW (ref 13.0–17.1)
LYMPHS ABS: 0.6 10*3/uL — AB (ref 0.9–3.3)
Lymphocytes Relative: 8 %
MCH: 23.7 pg — AB (ref 27.2–33.4)
MCHC: 31.7 g/dL — AB (ref 32.0–36.0)
MCV: 74.9 fL — ABNORMAL LOW (ref 79.3–98.0)
MONO ABS: 0.9 10*3/uL (ref 0.1–0.9)
MONOS PCT: 12 %
Neutro Abs: 5.7 10*3/uL (ref 1.5–6.5)
Neutrophils Relative %: 77 %
Platelet Count: 171 10*3/uL (ref 140–400)
RBC: 3.92 MIL/uL — ABNORMAL LOW (ref 4.20–5.82)
RDW: 23 % — AB (ref 11.0–14.6)
WBC Count: 7.4 10*3/uL (ref 4.0–10.3)

## 2017-11-21 ENCOUNTER — Inpatient Hospital Stay: Payer: Medicare Other

## 2017-11-21 ENCOUNTER — Encounter: Payer: Self-pay | Admitting: Hematology and Oncology

## 2017-11-21 ENCOUNTER — Inpatient Hospital Stay (HOSPITAL_BASED_OUTPATIENT_CLINIC_OR_DEPARTMENT_OTHER): Payer: Medicare Other | Admitting: Hematology and Oncology

## 2017-11-21 VITALS — BP 98/59 | HR 75 | Temp 97.6°F | Resp 18 | Ht 68.0 in | Wt 268.9 lb

## 2017-11-21 DIAGNOSIS — E119 Type 2 diabetes mellitus without complications: Secondary | ICD-10-CM | POA: Diagnosis not present

## 2017-11-21 DIAGNOSIS — K922 Gastrointestinal hemorrhage, unspecified: Secondary | ICD-10-CM | POA: Diagnosis not present

## 2017-11-21 DIAGNOSIS — Z79899 Other long term (current) drug therapy: Secondary | ICD-10-CM | POA: Diagnosis not present

## 2017-11-21 DIAGNOSIS — I10 Essential (primary) hypertension: Secondary | ICD-10-CM | POA: Diagnosis not present

## 2017-11-21 DIAGNOSIS — D509 Iron deficiency anemia, unspecified: Secondary | ICD-10-CM

## 2017-11-21 DIAGNOSIS — D638 Anemia in other chronic diseases classified elsewhere: Secondary | ICD-10-CM

## 2017-11-21 DIAGNOSIS — D62 Acute posthemorrhagic anemia: Secondary | ICD-10-CM

## 2017-11-21 DIAGNOSIS — D500 Iron deficiency anemia secondary to blood loss (chronic): Secondary | ICD-10-CM

## 2017-11-21 DIAGNOSIS — K7460 Unspecified cirrhosis of liver: Secondary | ICD-10-CM | POA: Diagnosis not present

## 2017-11-28 ENCOUNTER — Inpatient Hospital Stay: Payer: Medicare Other | Attending: Hematology and Oncology

## 2017-11-28 ENCOUNTER — Other Ambulatory Visit: Payer: Medicare Other

## 2017-11-28 ENCOUNTER — Telehealth: Payer: Self-pay

## 2017-11-28 DIAGNOSIS — Z79899 Other long term (current) drug therapy: Secondary | ICD-10-CM | POA: Insufficient documentation

## 2017-11-28 DIAGNOSIS — D500 Iron deficiency anemia secondary to blood loss (chronic): Secondary | ICD-10-CM

## 2017-11-28 DIAGNOSIS — D509 Iron deficiency anemia, unspecified: Secondary | ICD-10-CM | POA: Diagnosis not present

## 2017-11-28 DIAGNOSIS — R6883 Chills (without fever): Secondary | ICD-10-CM | POA: Insufficient documentation

## 2017-11-28 DIAGNOSIS — N189 Chronic kidney disease, unspecified: Secondary | ICD-10-CM | POA: Diagnosis not present

## 2017-11-28 DIAGNOSIS — R110 Nausea: Secondary | ICD-10-CM | POA: Diagnosis not present

## 2017-11-28 DIAGNOSIS — N183 Chronic kidney disease, stage 3 (moderate): Secondary | ICD-10-CM | POA: Diagnosis not present

## 2017-11-28 LAB — IRON AND TIBC
IRON: 32 ug/dL — AB (ref 42–163)
SATURATION RATIOS: 8 % — AB (ref 42–163)
TIBC: 398 ug/dL (ref 202–409)
UIBC: 365 ug/dL

## 2017-11-28 LAB — CMP (CANCER CENTER ONLY)
ALBUMIN: 3.1 g/dL — AB (ref 3.5–5.0)
ALT: 12 U/L (ref 0–55)
ANION GAP: 10 (ref 3–11)
AST: 18 U/L (ref 5–34)
Alkaline Phosphatase: 84 U/L (ref 40–150)
BUN: 59 mg/dL — AB (ref 7–26)
CHLORIDE: 95 mmol/L — AB (ref 98–109)
CO2: 25 mmol/L (ref 22–29)
Calcium: 9.2 mg/dL (ref 8.4–10.4)
Creatinine: 2.31 mg/dL — ABNORMAL HIGH (ref 0.70–1.30)
GFR, Est AFR Am: 31 mL/min — ABNORMAL LOW (ref >60)
GFR, Estimated: 27 mL/min — ABNORMAL LOW (ref >60)
GLUCOSE: 172 mg/dL — AB (ref 70–140)
POTASSIUM: 3.3 mmol/L — AB (ref 3.5–5.1)
Sodium: 130 mmol/L — ABNORMAL LOW (ref 136–145)
Total Bilirubin: 0.8 mg/dL (ref 0.2–1.2)
Total Protein: 7 g/dL (ref 6.4–8.3)

## 2017-11-28 LAB — CBC WITH DIFFERENTIAL (CANCER CENTER ONLY)
BASOS ABS: 0 10*3/uL (ref 0.0–0.1)
BASOS PCT: 0 %
EOS ABS: 0.2 10*3/uL (ref 0.0–0.5)
EOS PCT: 2 %
HCT: 29.6 % — ABNORMAL LOW (ref 38.4–49.9)
Hemoglobin: 8.8 g/dL — ABNORMAL LOW (ref 13.0–17.1)
Lymphocytes Relative: 8 %
Lymphs Abs: 0.5 10*3/uL — ABNORMAL LOW (ref 0.9–3.3)
MCH: 22.7 pg — ABNORMAL LOW (ref 27.2–33.4)
MCHC: 29.7 g/dL — ABNORMAL LOW (ref 32.0–36.0)
MCV: 76.3 fL — ABNORMAL LOW (ref 79.3–98.0)
MONO ABS: 0.4 10*3/uL (ref 0.1–0.9)
Monocytes Relative: 6 %
Neutro Abs: 5.9 10*3/uL (ref 1.5–6.5)
Neutrophils Relative %: 84 %
PLATELETS: 196 10*3/uL (ref 140–400)
RBC: 3.88 MIL/uL — ABNORMAL LOW (ref 4.20–5.82)
RDW: 21.7 % — ABNORMAL HIGH (ref 11.0–14.6)
WBC Count: 7.1 10*3/uL (ref 4.0–10.3)

## 2017-11-28 LAB — SAMPLE TO BLOOD BANK

## 2017-11-28 LAB — FERRITIN: FERRITIN: 33 ng/mL (ref 22–316)

## 2017-11-28 NOTE — Telephone Encounter (Signed)
Left a message for patient letting him know that he does not need to come to his transfusion appointment on Thursday due to his Hgo being 8.8.

## 2017-11-30 ENCOUNTER — Inpatient Hospital Stay: Payer: Medicare Other

## 2017-11-30 ENCOUNTER — Other Ambulatory Visit: Payer: Medicare Other

## 2017-12-01 DIAGNOSIS — N183 Chronic kidney disease, stage 3 (moderate): Secondary | ICD-10-CM | POA: Diagnosis not present

## 2017-12-01 DIAGNOSIS — I5030 Unspecified diastolic (congestive) heart failure: Secondary | ICD-10-CM | POA: Diagnosis not present

## 2017-12-01 DIAGNOSIS — K7460 Unspecified cirrhosis of liver: Secondary | ICD-10-CM | POA: Diagnosis not present

## 2017-12-04 ENCOUNTER — Other Ambulatory Visit (HOSPITAL_COMMUNITY): Payer: Self-pay | Admitting: Cardiology

## 2017-12-05 ENCOUNTER — Ambulatory Visit (HOSPITAL_COMMUNITY)
Admission: RE | Admit: 2017-12-05 | Discharge: 2017-12-05 | Disposition: A | Discharge status: Home / Self Care | Payer: Medicare Other | Source: Ambulatory Visit | Attending: Cardiology | Admitting: Cardiology

## 2017-12-05 ENCOUNTER — Other Ambulatory Visit: Payer: Self-pay

## 2017-12-05 ENCOUNTER — Encounter (HOSPITAL_COMMUNITY): Payer: Self-pay | Admitting: Cardiology

## 2017-12-05 VITALS — BP 135/56 | HR 74 | Wt 269.1 lb

## 2017-12-05 DIAGNOSIS — R188 Other ascites: Secondary | ICD-10-CM | POA: Diagnosis not present

## 2017-12-05 DIAGNOSIS — Z79899 Other long term (current) drug therapy: Secondary | ICD-10-CM | POA: Diagnosis not present

## 2017-12-05 DIAGNOSIS — E785 Hyperlipidemia, unspecified: Secondary | ICD-10-CM | POA: Diagnosis not present

## 2017-12-05 DIAGNOSIS — I130 Hypertensive heart and chronic kidney disease with heart failure and stage 1 through stage 4 chronic kidney disease, or unspecified chronic kidney disease: Secondary | ICD-10-CM | POA: Insufficient documentation

## 2017-12-05 DIAGNOSIS — I5032 Chronic diastolic (congestive) heart failure: Secondary | ICD-10-CM

## 2017-12-05 DIAGNOSIS — Z953 Presence of xenogenic heart valve: Secondary | ICD-10-CM | POA: Insufficient documentation

## 2017-12-05 DIAGNOSIS — N183 Chronic kidney disease, stage 3 unspecified: Secondary | ICD-10-CM

## 2017-12-05 DIAGNOSIS — M109 Gout, unspecified: Secondary | ICD-10-CM | POA: Diagnosis not present

## 2017-12-05 DIAGNOSIS — Z794 Long term (current) use of insulin: Secondary | ICD-10-CM | POA: Insufficient documentation

## 2017-12-05 DIAGNOSIS — Z951 Presence of aortocoronary bypass graft: Secondary | ICD-10-CM | POA: Insufficient documentation

## 2017-12-05 DIAGNOSIS — I2510 Atherosclerotic heart disease of native coronary artery without angina pectoris: Secondary | ICD-10-CM | POA: Diagnosis not present

## 2017-12-05 DIAGNOSIS — I482 Chronic atrial fibrillation: Secondary | ICD-10-CM | POA: Diagnosis not present

## 2017-12-05 DIAGNOSIS — F329 Major depressive disorder, single episode, unspecified: Secondary | ICD-10-CM | POA: Diagnosis not present

## 2017-12-05 DIAGNOSIS — J449 Chronic obstructive pulmonary disease, unspecified: Secondary | ICD-10-CM | POA: Diagnosis not present

## 2017-12-05 DIAGNOSIS — K219 Gastro-esophageal reflux disease without esophagitis: Secondary | ICD-10-CM | POA: Diagnosis not present

## 2017-12-05 DIAGNOSIS — K7460 Unspecified cirrhosis of liver: Secondary | ICD-10-CM | POA: Insufficient documentation

## 2017-12-05 DIAGNOSIS — E1122 Type 2 diabetes mellitus with diabetic chronic kidney disease: Secondary | ICD-10-CM | POA: Diagnosis not present

## 2017-12-05 DIAGNOSIS — K7469 Other cirrhosis of liver: Secondary | ICD-10-CM | POA: Diagnosis not present

## 2017-12-05 DIAGNOSIS — G4733 Obstructive sleep apnea (adult) (pediatric): Secondary | ICD-10-CM | POA: Insufficient documentation

## 2017-12-05 NOTE — Patient Instructions (Signed)
Take Metolazone 2.5 mg (1 tab) daily for 3 days  -Then go back to taking every other day  Paracentesis scheduled   Your physician recommends that you schedule a follow-up appointment in: 1 week with APP clinic

## 2017-12-06 ENCOUNTER — Other Ambulatory Visit: Payer: Self-pay

## 2017-12-06 ENCOUNTER — Inpatient Hospital Stay: Payer: Medicare Other

## 2017-12-06 ENCOUNTER — Inpatient Hospital Stay (HOSPITAL_BASED_OUTPATIENT_CLINIC_OR_DEPARTMENT_OTHER): Payer: Medicare Other | Admitting: Medical

## 2017-12-06 DIAGNOSIS — D631 Anemia in chronic kidney disease: Secondary | ICD-10-CM

## 2017-12-06 DIAGNOSIS — N183 Chronic kidney disease, stage 3 unspecified: Secondary | ICD-10-CM

## 2017-12-06 DIAGNOSIS — D509 Iron deficiency anemia, unspecified: Secondary | ICD-10-CM

## 2017-12-06 DIAGNOSIS — D649 Anemia, unspecified: Secondary | ICD-10-CM

## 2017-12-06 DIAGNOSIS — D500 Iron deficiency anemia secondary to blood loss (chronic): Secondary | ICD-10-CM

## 2017-12-06 DIAGNOSIS — Z79899 Other long term (current) drug therapy: Secondary | ICD-10-CM | POA: Diagnosis not present

## 2017-12-06 DIAGNOSIS — R6883 Chills (without fever): Secondary | ICD-10-CM | POA: Diagnosis not present

## 2017-12-06 DIAGNOSIS — R110 Nausea: Secondary | ICD-10-CM

## 2017-12-06 LAB — PREPARE RBC (CROSSMATCH)

## 2017-12-06 LAB — CBC WITH DIFFERENTIAL (CANCER CENTER ONLY)
BASOS PCT: 1 %
Basophils Absolute: 0.1 10*3/uL (ref 0.0–0.1)
EOS PCT: 2 %
Eosinophils Absolute: 0.1 10*3/uL (ref 0.0–0.5)
HEMATOCRIT: 24.9 % — AB (ref 38.4–49.9)
Hemoglobin: 7.9 g/dL — ABNORMAL LOW (ref 13.0–17.1)
Lymphocytes Relative: 6 %
Lymphs Abs: 0.4 10*3/uL — ABNORMAL LOW (ref 0.9–3.3)
MCH: 23 pg — ABNORMAL LOW (ref 27.2–33.4)
MCHC: 31.6 g/dL — AB (ref 32.0–36.0)
MCV: 72.8 fL — AB (ref 79.3–98.0)
MONO ABS: 0.5 10*3/uL (ref 0.1–0.9)
MONOS PCT: 8 %
NEUTROS ABS: 5.7 10*3/uL (ref 1.5–6.5)
Neutrophils Relative %: 83 %
PLATELETS: 192 10*3/uL (ref 140–400)
RBC: 3.42 MIL/uL — AB (ref 4.20–5.82)
RDW: 23.2 % — AB (ref 11.0–14.6)
WBC: 6.8 10*3/uL (ref 4.0–10.3)

## 2017-12-06 LAB — SAMPLE TO BLOOD BANK

## 2017-12-06 MED ORDER — DIPHENHYDRAMINE HCL 25 MG PO CAPS
25.0000 mg | ORAL_CAPSULE | Freq: Once | ORAL | Status: AC
  Administered 2017-12-06: 25 mg via ORAL

## 2017-12-06 MED ORDER — LORAZEPAM 1 MG PO TABS
ORAL_TABLET | ORAL | Status: AC
Start: 2017-12-06 — End: ?
  Filled 2017-12-06: qty 1

## 2017-12-06 MED ORDER — ONDANSETRON 8 MG PO TBDP
8.0000 mg | ORAL_TABLET | Freq: Once | ORAL | Status: AC
  Administered 2017-12-06: 8 mg via ORAL
  Filled 2017-12-06: qty 1

## 2017-12-06 MED ORDER — DIPHENHYDRAMINE HCL 25 MG PO CAPS
ORAL_CAPSULE | ORAL | Status: AC
  Filled 2017-12-06: qty 1

## 2017-12-06 MED ORDER — LORAZEPAM 1 MG PO TABS
0.5000 mg | ORAL_TABLET | Freq: Once | ORAL | Status: AC
  Administered 2017-12-06: 0.5 mg via ORAL

## 2017-12-06 MED ORDER — ACETAMINOPHEN 325 MG PO TABS
ORAL_TABLET | ORAL | Status: AC
  Filled 2017-12-06: qty 2

## 2017-12-06 MED ORDER — ACETAMINOPHEN 325 MG PO TABS
650.0000 mg | ORAL_TABLET | Freq: Once | ORAL | Status: AC
  Administered 2017-12-06: 650 mg via ORAL

## 2017-12-06 MED ORDER — HEPARIN SOD (PORK) LOCK FLUSH 100 UNIT/ML IV SOLN
500.0000 [IU] | Freq: Every day | INTRAVENOUS | Status: DC | PRN
  Filled 2017-12-06: qty 5

## 2017-12-06 MED ORDER — SODIUM CHLORIDE 0.9 % IV SOLN
250.0000 mL | Freq: Once | INTRAVENOUS | Status: AC
  Administered 2017-12-06: 250 mL via INTRAVENOUS

## 2017-12-06 MED ORDER — SODIUM CHLORIDE 0.9% FLUSH
10.0000 mL | INTRAVENOUS | Status: DC | PRN
  Filled 2017-12-06: qty 10

## 2017-12-06 NOTE — Progress Notes (Signed)
Patient with ongoing nausea.  Dr. Lebron Conners aware.  Verbal order given for Zofran ODT 8mg  X 1.  Patient tolerated medication well.

## 2017-12-06 NOTE — Progress Notes (Signed)
Patient ID: [name], male   DOB: 11/26/1945, 72 y.o.   MRN: [id number]    Advanced Heart Failure Clinic Note   GI: Dr Laural Golden Pulmonary: Dr Melvyn Novas  PCP: Marjean Donna [facility])  Cardiology: Dr. Aundra Dubin  Mr Dunne is a 72 y.o. 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  Last LHC in 8/13 showed patent grafts.  AVR was in 10/13.  Last echo in 1/18 showed EF 60-65%, normal bioprosthetic aortic valve.   July 2015:  Admitted to the hospital for GI bleed requiring 2 PRBCs. No additional GI work-up with recent EGD showing gastritis.  He had an AV nodal ablation in 9/15 with significant improvement after this.  He has St Jude PPM.   In 6/17, had right knee operation.    He was admitted in 1/19 with GI bleeding, transfused 2 units PRBCs.  EGD showed grade I varices, portal hypertensive gastropathy, and gastric polyps.  Capsule endoscopy showed nonbleeding AVMs.    He was admitted again in 2/19 with GI bleeding.  He was again transfused.  Nuclear medicine bleeding study was negative.   Admitted 09/2017 with GI bleed. Received 3UPRBCs. He was going to transfer to [facility] but there were not beds available. Arlyce Harman and metolazone stopped.   In 4/19, he has small bowel endoscopy at [facility] with APC to jejunal AVMs.   Today he returns for HF follow up with his wife.  He had his last paracentesis about 5 wks ago.  His abdomen is distended. He is more short of breath, gets dyspnea just walking around the house and walking out to his car.  Weight, however, has trended down.  He has a poor appetite.  No BRBPR/melena currently.  No chest pain.   Labs (8/14) LDL 38 Labs (3/15) K 3.4, creatinine 1.7, BUN 55  Labs (10/15/13) K 4.0 Creatinine 1.6 BUN 32 Labs (10/29/13) K 4.0 Creatinine 1.6  Labs (4/15) K 4.2 Creatinine 1.32 =>  1.5, HCT 35.2 Labs (12/11/13) K 3.6 Creatinine 1.56 Pro BNP 1152 Hemoglobin 9.6  Labs (01/12/14) K 3.0, creatinine 2.0, hemoglobin 8.8 Labs (01/31/14) K 3.3, creatinine 1.49 Labs (8/15) K 3.9, creatinine 1.89 Labs (9/15): K 3.7, creatinine 1.6, AST 48, ALT 52, TSH normal Labs (10/15): K 3.5, creatinine 1.58, BNP 780 Labs (3/16): K 3.5, creatinine 1.6 Labs 12/03/2014: K 4.4 Creatinine 1.55 Labs 12/12/2014: K 3.7 Creatinine 1.48, LDL 50 Labs 8/16: K 3.8, creatinine 2.42 Labs 04/07/2015: K 4.3, Creatinine 2.22, BNP 169 Labs (11/16): K 4, creatinine 1.78 => 1.55, BNP 120 Labs 11/11/2015 K 3.7 Creatinine 1.53  Labs (6/17): K 3.8, creatinine 1.52 Labs (12/17): K 3.5, creatinine 1.95, LDL 32 Labs (1/18): K 4.2, creatinine 2.51 Labs (4/18): K 3.6, creatinine 2.5, hgb 10.2 Labs (10/18): K 3.5, creatinine 2.72, hgb 10.4 Labs (2/19): K 3.6, creatinine 2.39, hgb 8.4 Labs (09/30/2017): Creatinine 2.55 K 3.8 Hgb 8.9  Labs (10/17/2017): K 2.9 Creatinine 2.54  Labs (5/19): K 3.3, creatinine 2.31  PMH: 1. CAD: s/p CABG 2004. Last LHC (8/13) with patent free radial-OM, patent SVG-D, patent LIMA-LAD.  2. Permanent atrial fibrillation: Not anticoagulated due to GI bleeding.  Holter monitor (4/15) with mean HR 115 (afib).   He had AV nodal ablation with St Jude dual chamber PPM  3. Chronic diastolic CHF: Echo (8/84) with EF 55-60%, bioprosthetic aortic valve with  mean gradient 22 mmHg, mildly decreased RV systolic function. RHC (12/2013): RA 14, RV 44/4/11, PA 50/23 (33), PCWP 20, v = 35, Fick CO/CI: 6.0/2.6, PVR 2.2 WU, PA 59%.  Echo (8/15) with EF 55-60%, mild LVH, bioprosthetic aortic valve with mean gradient 16 mmHg, PA systolic pressure 46 mmHg, mild to moderate MR.  Echo (8/16) with EF 55-60%, normal bioprosthetic aortic valve, mild MR, PASP 44 mmHg.  - Gynecomastia with spironolactone.  - Echo (1/18): EF 60-65%, grade II diastolic dysfunction, bioprosthetic aortic valve appeared normal, PA systolic pressure 59  mmHg.  - Echo (3/19): EF 60-65%, mild LVH, bioprosthetic aortic valve with mean gradient 16 mmHg, mild-moderately decreased RV systolic function, PASP 72 mmHg.  4. Severe aortic stenosis s/p bioprosthetic AVR in 10/13. Mean gradient 22 mmHg across valve on echo in 8/14.  Mean gradient 16 mmHg on echo in 8/15. Mean gradient 15 mmHg by echo in 1/18.  Mean gradient 16 mmHg by echo in 3/19.  5. NASH with cirrhosis: ascites, thrombocytopenia.  CT abdomen in 6/17 showed cirrhosis and moderate ascites. He has regular paracenteses.  6. 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  EGD in 1/15 showed mild portal gastropathy and 2 small antral ulcers. Recurrent GI bleed in 6/15, EGD showed gastritis and ?GAVE.  - EGD (4/18): grade I varices, gastric polyps, GAVE.   - Colonoscopy (4/18): No bleeding source.  - GI bleeding 1/19: EGD showed grade I varices, portal hypertensive gastropathy, and gastric polyps.  Capsule endoscopy showed nonbleeding AVMs.  - GI bleeding 2/19: Nuclear medicine bleeding study was negative.  - Small bowel endoscopy 4/19 with jejunal AVMs treated with APC.  7. Hyperlipidemia 8. HTN 9. GERD 10. COPD 79. OSA: Cannot tolerate CPAP.  12. 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  14. Carotid dopplers (11/15) with minimal stenosis.  15. ABIs 12/11/2014: normal   16. Depression  SH: Lives with wife in [place], prior smoker.   FH: CAD  ROS: All systems reviewed and negative except as per HPI.   Current Outpatient Medications on File Prior to Encounter  Medication Sig Dispense Refill  . acetaminophen (TYLENOL) 500 MG tablet Take 1,000 mg by mouth at bedtime as needed for mild pain or headache.     . allopurinol (ZYLOPRIM) 100 MG tablet Take 1 tablet (100 mg total) by mouth daily. 90 tablet 3  . atorvastatin (LIPITOR) 20 MG tablet Take 20 mg by mouth daily at 6 PM.     . Chlorpheniramine Maleate (ALLERGY PO) Take 1 tablet by mouth daily.    [name] gabapentin (NEURONTIN) 300 MG capsule Take 300 mg by  mouth 3 (three) times daily.  6  . insulin NPH Human (HUMULIN N,NOVOLIN N) 100 UNIT/ML injection Inject 65 Units into the skin 2 (two) times daily before a meal.     . insulin regular (NOVOLIN R,HUMULIN R) 100 units/mL injection Inject 30 Units into the skin 2 (two) times daily before a meal.     . metolazone (ZAROXOLYN) 5 MG tablet Take 1 tablet (5 mg total) by mouth every other day. 45 tablet 3  . metoprolol succinate (TOPROL-XL) 25 MG 24 hr tablet Take 1 tablet (25 mg total) by mouth 2 (two) times daily. 60 tablet 3  . Multiple Vitamins-Minerals ([employer] SPECTRAVITE ADULT 50+ PO) Take 1 tablet by mouth daily.    . pantoprazole (PROTONIX) 40 MG tablet Take 1 tablet (40 mg total) by mouth 2 (two) times daily before a meal. 60 tablet 0  . potassium chloride SA (  K-DUR,KLOR-CON) 20 MEQ tablet Take 2 tablets (40 mEq total) by mouth 2 (two) times daily. 360 tablet 3  . potassium chloride SA (KLOR-CON M20) 20 MEQ tablet Take 2 tablets (40 mEq total) by mouth 2 (two) times daily. 120 tablet 3  . PROAIR HFA 108 (90 Base) MCG/ACT inhaler Take 2 puffs by mouth 2 (two) times daily.    . sertraline (ZOLOFT) 50 MG tablet Take 1 tablet (50 mg total) by mouth daily. 90 tablet 3  . spironolactone (ALDACTONE) 25 MG tablet Take 1 tablet (25 mg total) by mouth daily. 90 tablet 3  . tamsulosin (FLOMAX) 0.4 MG CAPS capsule TAKE ONE CAPSULE BY MOUTH EVERY DAY AFTER SUPPER 90 capsule 3  . torsemide (DEMADEX) 20 MG tablet TAKE 6 TABLETS (120 MG TOTAL) BY MOUTH 2 (TWO) TIMES DAILY. 360 tablet 3  . vitamin C (ASCORBIC ACID) 500 MG tablet Take 500 mg by mouth 2 (two) times daily.    . metolazone (ZAROXOLYN) 5 MG tablet TAKE 1 TABLET (5 MG TOTAL) BY MOUTH EVERY OTHER DAY. 15 tablet 0   No current facility-administered medications on file prior to encounter.     Vitals:   12/05/17 1429  BP: (!) 135/56  Pulse: 74  SpO2: 98%  Weight: 269 lb 1.9 oz (122.1 kg)     Wt Readings from Last 3 Encounters:  12/05/17 269 lb 1.9 oz  (122.1 kg)  11/21/17 268 lb 14.4 oz (122 kg)  10/24/17 264 lb 8 oz (120 kg)    General: NAD Neck: JVP 9-10 cm with HJR, no thyromegaly or thyroid nodule.  Lungs: Clear to auscultation bilaterally with normal respiratory effort. CV: Nondisplaced PMI.  Heart regular S1/S2, no S3/S4, no murmur.  1+ ankle edema.  No carotid bruit.  Normal pedal pulses.  Abdomen: Soft, nontender, no hepatosplenomegaly, moderate abdominal distention.  Skin: Intact without lesions or rashes.  Neurologic: Alert and oriented x 3.  Psych: Normal affect. Extremities: No clubbing or cyanosis.  HEENT: Normal.     Assessment/Plan: 1. Chronic diastolic CHF:  Echo 7/74 with EF 60-65%.  He has had trouble with ascites due to cardiac cirrhosis/NASH.  NYHA class IIIb symptoms.  He is volume overloaded on exam.   Recent BMET with stable creatinine at 2.3.  - Continue torsemide 120 mg bid.    - Take metolazone 3 days in a row then back to 5 mg every other day.   - Continue spironolactone 25 mg daily. 2. Chronic atrial fibrillation:  CHADSVASC 4 - age > 14, CHF, HTN, DM2. He is not anticoagulated (was on coumadin in past) due to history of recurrent GI bleeding from AVMs in 2/19. s/p AV node ablation with dual chamber pacing given very difficult rate control (St Jude).   - Intolerant of ASA and coumadin even for short period, so no Watchman pursued. 3. CKD stage III: He follows with Dr. Joelyn Oms.  4. Bioprosthetic AVR: Stable on 3/19 echo.    5. CAD: s/p CABG.  No chest pain.  - Continue atorvastatin 20 mg daily and Toprol XL 25 mg BID.  - Off ASA and anticoag with history of GI bleed.  6. Cirrhosis: May be a combination of cardiac cirrhosis and NASH-related. Follows with Dr. Laural Golden.  - Abdomen significantly distended, I will arrange for paracentesis.   7. Depression: Continue sertraline.  8. OSA: Continue CPAP.  9. Gout: On colchicine and allopurinol.  10. GI bleed: Small bowel endoscopy with APC of jejunal AVMs in 4/19.    -  To get CBC at cancer center tomorrow.  11. HTN: Continue current medications.   Followup in 1 month with NP/PA.   [name] 12/06/2017

## 2017-12-06 NOTE — Patient Instructions (Signed)

## 2017-12-06 NOTE — Progress Notes (Signed)
Patient continued with nausea.  Sandi Mealy, PA-C notified.  Verbal order given for Ativan 0.5mg  sublingual X 1 dose.   Patient began to have shakes and chills with nausea.  VSS.  Patient denies chest pain, and denies shortness of breath.  Sandi Mealy, PA-C assessed patient in infusion area. Patient provided with juice and crackers.  Chill and shakes subsided.  Sandi Mealy, PA-C collaborated with Dr. Lebron Conners after assessment, both in agreement that symptoms are not related to blood transfusion.  Patient educated on when to utilized ED if needed upon discharged.  VS stable upon discharged, patient with no acute distress.

## 2017-12-07 ENCOUNTER — Encounter (HOSPITAL_COMMUNITY): Payer: Self-pay

## 2017-12-07 ENCOUNTER — Ambulatory Visit (HOSPITAL_COMMUNITY)
Admission: RE | Admit: 2017-12-07 | Discharge: 2017-12-07 | Disposition: A | Discharge status: Home / Self Care | Payer: Medicare Other | Source: Ambulatory Visit | Attending: Cardiology | Admitting: Cardiology

## 2017-12-07 DIAGNOSIS — K7469 Other cirrhosis of liver: Secondary | ICD-10-CM | POA: Diagnosis not present

## 2017-12-07 DIAGNOSIS — I5032 Chronic diastolic (congestive) heart failure: Secondary | ICD-10-CM | POA: Diagnosis not present

## 2017-12-07 DIAGNOSIS — R188 Other ascites: Secondary | ICD-10-CM | POA: Diagnosis not present

## 2017-12-07 LAB — BPAM RBC
Blood Product Expiration Date: 201906012359
ISSUE DATE / TIME: 201905151502
Unit Type and Rh: 5100

## 2017-12-07 LAB — TYPE AND SCREEN
ABO/RH(D): A POS
Antibody Screen: POSITIVE
DONOR AG TYPE: NEGATIVE
UNIT DIVISION: 0

## 2017-12-07 NOTE — Progress Notes (Signed)
Paracentesis complete no signs of distress, 8L ascites removed.

## 2017-12-07 NOTE — Progress Notes (Signed)
[name] is a 72 year old male with a history of multiple comorbidities who is seen by Dr. Lebron Conners for his history of anemia from iron deficiency, chronic disease, chronic GI blood loss, anemia due to hepatic dysfunction secondary to cirrhosis with possible compounding from chronic renal disease.  The patient was having nausea while having a transfusion of packed red blood cells.  Dr. Lebron Conners had been contacted.  He was given Zofran ODT 8 mg x 1.  Despite this he continued to have nausea.  He was given Ativan 0.5 mg sublingually x1.  His nausea improved.  The patient was also noted to have shakes and chills.  According to his wife, the patient has episodic chills and shaking at home.  This is believed to be due to his anemia.  He denies shortness of breath or chest pain.  His vital signs are normal.  This case was discussed with Dr. Lebron Conners who agrees that the patient's shaking and chills are not new and are not suspected to be secondary to a hemolytic reaction.    Sandi Mealy, MHS, PA-C Physician Assistant

## 2017-12-07 NOTE — Procedures (Signed)
   [place] RLQ paracentesis  8 Liters amber fluid  Tolerated well

## 2017-12-10 NOTE — Assessment & Plan Note (Signed)
72 y.o. male with extensive comorbidities including congestive heart failure, replaced aortic valve, hepatic cirrhosis and history of gastrointestinal bleeding due to portal gastropathy as well as newly discovered small bowel AVM.  My evaluation is that of a complex anemia including elements of anemia due to iron deficiency due to chronic gastrointestinal blood loss, anemia of chronic disease, anemia due to hepatic dysfunction of cirrhosis, and possibly a component of anemia due to a decreased erythropoietin production due to chronic renal disease.  Interim, patient had recurrent GI bleeding episodes with repeat development of symptomatic anemia.  Patient underwent additional GI evaluation and several additional bleeding sources were addressed.  Hemoglobin is significantly better today compared to his previous values.    Plan: --continue oral iron supplementation. --No need for transfusion today. --Weekly CBC with transfusion of 1 unit of packed red blood cells for hemoglobin of 8.5 or lower with torsemide administration to reduce the volume burden --RTC 3 months for hematological monitoring: Labs 2-3 days prior, clinic visit, possible intravenous iron administration.

## 2017-12-10 NOTE — Progress Notes (Signed)
[facility] Follow-up Visit:  Assessment: Acute blood loss anemia 72 y.o. male with extensive comorbidities including congestive heart failure, replaced aortic valve, hepatic cirrhosis and history of gastrointestinal bleeding due to portal gastropathy as well as newly discovered small bowel AVM.  My evaluation is that of a complex anemia including elements of anemia due to iron deficiency due to chronic gastrointestinal blood loss, anemia of chronic disease, anemia due to hepatic dysfunction of cirrhosis, and possibly a component of anemia due to a decreased erythropoietin production due to chronic renal disease.  Interim, patient had recurrent GI bleeding episodes with repeat development of symptomatic anemia.  Patient underwent additional GI evaluation and several additional bleeding sources were addressed.  Hemoglobin is significantly better today compared to his previous values.    Plan: --continue oral iron supplementation. --No need for transfusion today. --Weekly CBC with transfusion of 1 unit of packed red blood cells for hemoglobin of 8.5 or lower with torsemide administration to reduce the volume burden --RTC 3 months for hematological monitoring: Labs 2-3 days prior, clinic visit, possible intravenous iron administration.  Voice recognition software was used and creation of this note. Despite my best effort at editing the text, some misspelling/errors may have occurred.   Orders Placed This Encounter  Procedures  . CBC with Differential (Cancer Center Only)    Standing Status:   Standing    Number of Occurrences:   16    Standing Expiration Date:   11/22/2018  . CBC with Differential (Cancer Center Only)    Standing Status:   Future    Standing Expiration Date:   11/22/2018  . CMP ([facility] only)    Standing Status:   Future    Standing Expiration Date:   11/22/2018  . Lactate dehydrogenase (LDH)    Standing Status:   Future    Standing Expiration  Date:   11/21/2018  . Iron and TIBC    Standing Status:   Future    Standing Expiration Date:   11/22/2018  . Ferritin    Standing Status:   Future    Standing Expiration Date:   11/22/2018  . Sample to Blood Bank    Standing Status:   Standing    Number of Occurrences:   16    Standing Expiration Date:   11/22/2018    All questions were answered.  . The patient knows to call the clinic with any problems, questions or concerns.  This note was electronically signed.    History of Presenting Illness [name] is a 72 y.o. male followed in the [facility] for thrombocytopenia and anemia. 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 Please see the results of the recent lab work below for details.  Since the last visit to the clinic, patient has had at least 3 visits to the emergency room with symptoms of gastrointestinal bleeding.  He was admitted on 08/24/17 through 08/27/17 after presenting with increased fatigue and dyspnea on exertion following a weeklong bout of melena and presentation hemoglobin of 7.0.  Underwent EGD on 08/25/17 showing distal esophageal grade 1 varices moderate gastropathy few gastric fundal polyps with out any evidence of bleeding and normal D12.  Subsequently, underwent a capsule endoscopy on 08/26/17 demonstrating small bowel AVMs with bleeding.  Stabilized and discharged, readmitted on 09/07/17 through 09/09/17 with  hemoglobin 7.2, received 2 units of packed red blood cells and discharged.  Admitted on 09/26/17 to 09/30/17 with hemoglobin 7.3 and was planned to be transferred to [facility] for additional assessment, but instead was stabilized and discharged.  Currently is planned to be assessed by [facility] on  11/01/17.  The interim, patient had a consultation at [facility] and underwent small bowel evaluation.  Four bleeding AVMs were identified in the small bowel and additional one in the stomach and were addressed.  Continues to feel weak and tired.  No recurrent melena or hematochezia.  Reports neuropathy in bilateral feet and some swelling of bilateral lower extremities.  Oncological/hematological History: --Labs, 01/14/16: Hgb 11.2, MCV 82.4, MCH 25.3, RDW 18.5, Plt 180;  --Labs, 06/28/16: Hgb 10.7, MCV 82.5, MCH 25.8, RDW 17.9, Plt 164;  --Colonoscopy/EGD, Apr 2018: Grade 1 esophageal varices. 0.7 cm sessile polyp in the gastric body with active bleeding, 2 polyps in the prepyloric stomach with evidence of bleeding. Gastropathy with vascular ectasia. Colonoscopy significant for splenic flexure lipoma and external hemorrhoids without evidence of active bleeding. --Labs, 10/26/16: Hgb 10.2, MCV 82.7, MCH 25.2, RDW 18.7, Plt 117;  --Labs, 02/21/17: Hgb   9.6, MCV 88.3, MCH 27.5, RDW 18.6, Plt 137;  --Labs, 04/04/17: Hgb   9.5, MCV 83.9, MCH 25.1, RDW 18.0, Plt 188; Cr 2.52 --Labs, 04/11/17: Hgb 10.0, MCV 83.2, MCH 25.1, RDW 17.5, Plt 167; Fe 110, FeSat 22%, TIBC 508, Ferritin 43, Folate >20, Vit B12 1167; Haptoglobin 213; TSH 3.24 --Ferrous sulfate 346m 2-3/day --Labs, 07/24/17: Hgb   9.2, MCV 87.4, MCH 26.4, RDW     ..., Plt 138; Fe 115, FeSat 22%, TIBC 533, Ferritin 53  --Transfusion, Jan 2019: pRBC x4 units  --Transfusion, Feb 2019: pRBC x4 units  --Transfusion, Mar 2019: pRBC x3 units --Labs, 10/17/17: Hgb   8.4, MCV 77.6, MCH 24.6, MCHC 31.7, RDW 19.6, Plt 181; Fe 25, FeSat 6%, TIBC 401, Ferritin 25; Epo 114  --Transfusion, Apr 2019: pRBC x3 units --Labs, 11/17/17: Hgb   9.3, MCV 74.9, MCH 23.7, MCHC 31.7, RDW 23.0, Plt 171;   Medical History: Past Medical History:  Diagnosis Date  . Aortic stenosis 03/08/2012   a.  s/p tissue AVR 10/13 with Dr. HRoxan Hockey    b. Echo 10/13: mod LVH, EF 55-60%, tissue AVR not well seen, no leak, gradient not too high (mean 141mg), MAC, mild MR, mild LAE, PASP 38  . Ascites    status post paracentesis with removal of 3.4 L of ascitic fluid  . Atrial fibrillation ([facility]   Permanent; off of Coumadin for now due to GI bleed  . AVM (arteriovenous malformation)    Recurrent GI bleeding requiring multiple transfusions  . CAD (coronary artery disease)    a. s/p CABG 2004;  b. [facility]0/13:  LHC 8/13: Free radial to obtuse marginal patent, SVG-diagonal patent, LIMA-LAD patent, EF 65-70%, mean aortic valve gradient 42  . Carotid bruit 06/14/2011   a. pre-AVR dopplers 10/13: no sig ICA stenosis  . Chronic diastolic heart failure ([facility]  . Chronic kidney disease   . Cirrhosis ([facility]  . COPD (chronic obstructive pulmonary disease) ([facility]  . Depression   . DM2 (diabetes mellitus, type 2) (HCC)    at least 10 yrs  . Dyspnea   . GERD (gastroesophageal reflux disease)   . Gout   . H/O hiatal hernia   . Headache   . Hyperlipidemia   .  Hyperlipidemia   . Hypertension    x 15 yrs  . Insomnia   . Iron deficiency anemia    Requiring intravenous iron  . Mediastinal adenopathy 09/22/2011  . OSA (obstructive sleep apnea) 1999    USES CPAP  . Osteoarthritis   . Overweight(278.02)   . Presence of permanent cardiac pacemaker   . Thrombocytopenia [facility])     Surgical History: Past Surgical History:  Procedure Laterality Date  . ABLATION  04-18-14   AVN ablation by Dr Caryl Comes  . AORTIC VALVE REPLACEMENT  04/25/2012   Procedure: AORTIC VALVE REPLACEMENT (AVR);  Surgeon: Melrose Nakayama, MD;  Location: [facility] [facility];  Service: Open Heart Surgery;  Laterality: N/A;  . AV NODE ABLATION N/A 04/18/2014   Procedure: AV NODE ABLATION;  Surgeon: Deboraha Sprang, MD;  Location: [facility] CATH LAB;  Service: Cardiovascular;  Laterality: N/A;  . CARPAL TUNNEL RELEASE    10/08/2003  . CATARACT EXTRACTION W/ INTRAOCULAR LENS IMPLANT Bilateral  09/28/2015 , 10/19/2015  . COLONOSCOPY WITH PROPOFOL N/A 10/28/2016   Procedure: COLONOSCOPY WITH PROPOFOL;  Surgeon: Rogene Houston, MD;  [facility];  Service: Endoscopy;  Laterality: N/A;  . CORONARY ANGIOPLASTY WITH STENT PLACEMENT  01/19/2005   drug eluting stent to high grade ostial stenosis of radial artery graft to OM  . CORONARY ARTERY BYPASS GRAFT   10/15/2002    [name]. [name], M.D.     . ESOPHAGOGASTRODUODENOSCOPY N/A 07/31/2013   Procedure: ESOPHAGOGASTRODUODENOSCOPY (EGD);  Surgeon: Milus Banister, MD;  Location: [facility];  Service: Endoscopy;  Laterality: N/A;  . ESOPHAGOGASTRODUODENOSCOPY Left 01/11/2014   Procedure: ESOPHAGOGASTRODUODENOSCOPY (EGD);  Surgeon: Juanita Craver, MD;  Location: [facility] ENDOSCOPY;  Service: Endoscopy;  Laterality: Left;  . ESOPHAGOGASTRODUODENOSCOPY (EGD) WITH PROPOFOL N/A 10/28/2016   Procedure: ESOPHAGOGASTRODUODENOSCOPY (EGD) WITH PROPOFOL;  Surgeon: Rogene Houston, MD;  [facility];  Service: Endoscopy;  Laterality: N/A;  . ESOPHAGOGASTRODUODENOSCOPY (EGD) WITH PROPOFOL N/A 08/25/2017   Procedure: ESOPHAGOGASTRODUODENOSCOPY (EGD) WITH PROPOFOL;  Surgeon: Rogene Houston, MD;  [facility];  Service: Endoscopy;  Laterality: N/A;  . GIVENS CAPSULE STUDY N/A 08/26/2017   Procedure: GIVENS CAPSULE STUDY;  Surgeon: Danie Binder, MD;  [facility];  Service: Endoscopy;  Laterality: N/A;  . HERNIA REPAIR    . IR PARACENTESIS  09/14/2017  . KNEE ARTHROSCOPY Right 01/20/2016   Procedure: ARTHROSCOPY RIGHT KNEE WITH MENSICAL DEBRIDEMENT;  Surgeon: Gaynelle Arabian, MD;  [facility]: [facility];  Service: Orthopedics;  Laterality: Right;  . LEFT AND RIGHT HEART CATHETERIZATION WITH CORONARY/GRAFT ANGIOGRAM N/A 03/07/2012   Procedure: LEFT AND RIGHT HEART CATHETERIZATION WITH Beatrix Fetters;  Surgeon: Sherren Mocha, MD;  Location[facility] CATH LAB;  Service: Cardiovascular;  Laterality: N/A;  . lipoma surgery    . OTHER  SURGICAL HISTORY  08/26/2011   [facility],  enteroscopy , revealing "three-four AVMs."   . PACEMAKER INSERTION  03-24-14   STJ Assurity single chamber pacemaker implanted by Dr Caryl Comes  . PARACENTESIS  12/2015  . PERMANENT PACEMAKER INSERTION N/A 03/24/2014   Procedure: PERMANENT PACEMAKER INSERTION;  Surgeon: Deboraha Sprang, MD;  Location: [facility] CATH LAB;  Service: Cardiovascular;  Laterality: N/A;  . POLYPECTOMY  10/28/2016   Procedure: POLYPECTOMY;  Surgeon: Rogene Houston, MD;  [facility];  Service: Endoscopy;;  gastric  . RIGHT HEART CATHETERIZATION N/A 01/01/2014   Procedure: RIGHT HEART CATH;  Surgeon: Jolaine Artist, MD;  Location: [facility] CATH LAB;  Service: Cardiovascular;  Laterality: N/A;  . TEE WITHOUT CARDIOVERSION  03/07/2012   Procedure: TRANSESOPHAGEAL ECHOCARDIOGRAM (TEE);  Surgeon: Thayer Headings, MD;  Location: [facility] ENDOSCOPY;  Service: Cardiovascular;  Laterality: N/A;    Family History: Family History  Problem Relation Age of Onset  . Hypertension Father   . Diabetes Father   . Heart disease Father   . COPD Sister   . Cancer Maternal Aunt        Breast cancer   . Cancer Maternal Aunt        Breast cancer   . Diabetes Son   . Cancer Daughter        Cervical cancer  . Coronary artery disease Unknown        FAMILY HISTORY    Social History: Social History   Socioeconomic History  . Marital status: Married    Spouse name: Not on file  . Number of children: 3  . Years of education: Not on file  . Highest education level: Not on file  Occupational History    Comment: Associate Professor at [employer]  . Financial resource strain: Not on file  . Food insecurity:    Worry: Not on file    Inability: Not on file  . Transportation needs:    Medical: Not on file    Non-medical: Not on file  Tobacco Use  . Smoking status: Former Smoker    Packs/day: 1.00    Years: 30.00    Pack years: 30.00    Types: Cigarettes    Last attempt to quit: 07/25/2000     Years since quitting: 17.3  . Smokeless tobacco: Never Used  . Tobacco comment: Quit smoking in 2002  Substance and Sexual Activity  . Alcohol use: No    Alcohol/week: 0.6 oz    Types: 1 Shots of liquor per week    Comment: stopped drinking alcohol  . Drug use: No  . Sexual activity: Yes  Lifestyle  . Physical activity:    Days per week: Not on file    Minutes per session: Not on file  . Stress: Not on file  Relationships  . Social connections:    Talks on phone: Not on file    Gets together: Not on file    Attends religious service: Not on file    Active member of club or organization: Not on file    Attends meetings of clubs or organizations: Not on file    Relationship status: Not on file  . Intimate partner violence:    Fear of current or ex partner: Not on file    Emotionally abused: Not on file    Physically abused: Not on file    Forced sexual activity: Not on file  Other Topics Concern  . Not on file  Social History Narrative   Lives in [place], [place] with wife.     Allergies: Allergies  Allergen Reactions  . Codeine Other (See Comments) and Palpitations    Hurting in chest  . Diltiazem Hcl Itching  . Niacin Other (See Comments)    Felt a "severe burning sensation" Hot flashes  . Aspirin     History of Bleeding ulcers   . Diltiazem Hcl Other (See Comments) and Hives    Gets hot    Medications:  Current Outpatient Medications  Medication Sig Dispense Refill  . acetaminophen (TYLENOL) 500 MG tablet Take 1,000 mg by mouth at bedtime as needed for mild pain or headache.     [name]  allopurinol (ZYLOPRIM) 100 MG tablet Take 1 tablet (100 mg total) by mouth daily. 90 tablet 3  . atorvastatin (LIPITOR) 20 MG tablet Take 20 mg by mouth daily at 6 PM.     . Chlorpheniramine Maleate (ALLERGY PO) Take 1 tablet by mouth daily.    [name] gabapentin (NEURONTIN) 300 MG capsule Take 300 mg by mouth 3 (three) times daily.  6  . insulin NPH Human (HUMULIN N,NOVOLIN N) 100 UNIT/ML  injection Inject 65 Units into the skin 2 (two) times daily before a meal.     . insulin regular (NOVOLIN R,HUMULIN R) 100 units/mL injection Inject 30 Units into the skin 2 (two) times daily before a meal.     . metolazone (ZAROXOLYN) 5 MG tablet Take 1 tablet (5 mg total) by mouth every other day. 45 tablet 3  . metolazone (ZAROXOLYN) 5 MG tablet TAKE 1 TABLET (5 MG TOTAL) BY MOUTH EVERY OTHER DAY. 15 tablet 0  . metoprolol succinate (TOPROL-XL) 25 MG 24 hr tablet Take 1 tablet (25 mg total) by mouth 2 (two) times daily. 60 tablet 3  . Multiple Vitamins-Minerals ([employer] SPECTRAVITE ADULT 50+ PO) Take 1 tablet by mouth daily.    . pantoprazole (PROTONIX) 40 MG tablet Take 1 tablet (40 mg total) by mouth 2 (two) times daily before a meal. 60 tablet 0  . potassium chloride SA (K-DUR,KLOR-CON) 20 MEQ tablet Take 2 tablets (40 mEq total) by mouth 2 (two) times daily. 360 tablet 3  . potassium chloride SA (KLOR-CON M20) 20 MEQ tablet Take 2 tablets (40 mEq total) by mouth 2 (two) times daily. 120 tablet 3  . PROAIR HFA 108 (90 Base) MCG/ACT inhaler Take 2 puffs by mouth 2 (two) times daily.    . sertraline (ZOLOFT) 50 MG tablet Take 1 tablet (50 mg total) by mouth daily. 90 tablet 3  . spironolactone (ALDACTONE) 25 MG tablet Take 1 tablet (25 mg total) by mouth daily. 90 tablet 3  . tamsulosin (FLOMAX) 0.4 MG CAPS capsule TAKE ONE CAPSULE BY MOUTH EVERY DAY AFTER SUPPER 90 capsule 3  . torsemide (DEMADEX) 20 MG tablet TAKE 6 TABLETS (120 MG TOTAL) BY MOUTH 2 (TWO) TIMES DAILY. 360 tablet 3  . vitamin C (ASCORBIC ACID) 500 MG tablet Take 500 mg by mouth 2 (two) times daily.     No current facility-administered medications for this visit.     Review of Systems: Review of Systems  Respiratory: Positive for shortness of breath.   All other systems reviewed and are negative.    PHYSICAL EXAMINATION Blood pressure (!) 98/59, pulse 75, temperature 97.6 F (36.4 C), temperature source Oral, resp. rate  18, height _0  (1.727 m), weight 268 lb 14.4 oz (122 kg), SpO2 100 %.  ECOG PERFORMANCE STATUS: 1 - Symptomatic but completely ambulatory  Physical Exam  Constitutional: He is oriented to person, place, and time and well-developed, well-nourished, and in no distress.  HENT:  Head: Normocephalic and atraumatic.  Mouth/Throat: Oropharynx is clear and moist and mucous membranes are normal.  Eyes: Pupils are equal, round, and reactive to light. Conjunctivae and EOM are normal. No scleral icterus.  Neck: No thyromegaly present.  Cardiovascular: Normal rate and regular rhythm.  Murmur heard. 2/6 early systolic murmur appreciated over the base of the heart  Pulmonary/Chest: Effort normal and breath sounds normal. No respiratory distress. He has no wheezes.  Abdominal: Soft. Bowel sounds are normal. He exhibits no distension and no mass. There is no tenderness.  Musculoskeletal: He exhibits no edema.  Lymphadenopathy:    He has no cervical adenopathy.  Neurological: He is alert and oriented to person, place, and time. No cranial nerve deficit.  Skin: Skin is warm and dry. No rash noted. No erythema. No pallor.     LABORATORY DATA: I have personally reviewed the data as listed: Appointment on 11/17/2017  Component Date Value Ref Range Status  . WBC Count 11/17/2017 7.4  4.0 - 10.3 K/uL Final  . RBC 11/17/2017 3.92* 4.20 - 5.82 MIL/uL Final  . Hemoglobin 11/17/2017 9.3* 13.0 - 17.1 g/dL Final  . HCT 11/17/2017 29.4* 38.4 - 49.9 % Final  . MCV 11/17/2017 74.9* 79.3 - 98.0 fL Final  . MCH 11/17/2017 23.7* 27.2 - 33.4 pg Final  . MCHC 11/17/2017 31.7* 32.0 - 36.0 g/dL Final  . RDW 11/17/2017 23.0* 11.0 - 14.6 % Final  . Platelet Count 11/17/2017 171  140 - 400 K/uL Final  . Neutrophils Relative % 11/17/2017 77  % Final  . Neutro Abs 11/17/2017 5.7  1.5 - 6.5 K/uL Final  . Lymphocytes Relative 11/17/2017 8  % Final  . Lymphs Abs 11/17/2017 0.6* 0.9 - 3.3 K/uL Final  . Monocytes Relative  11/17/2017 12  % Final  . Monocytes Absolute 11/17/2017 0.9  0.1 - 0.9 K/uL Final  . Eosinophils Relative 11/17/2017 2  % Final  . Eosinophils Absolute 11/17/2017 0.2  0.0 - 0.5 K/uL Final  . Basophils Relative 11/17/2017 1  % Final  . Basophils Absolute 11/17/2017 0.1  0.0 - 0.1 K/uL Final   Performed at [facility], [place] [address] [place], [place] [address]       Ardath Sax, MD

## 2017-12-11 ENCOUNTER — Other Ambulatory Visit (INDEPENDENT_AMBULATORY_CARE_PROVIDER_SITE_OTHER): Payer: Self-pay | Admitting: Internal Medicine

## 2017-12-12 ENCOUNTER — Inpatient Hospital Stay: Payer: Medicare Other

## 2017-12-12 ENCOUNTER — Other Ambulatory Visit: Payer: Self-pay

## 2017-12-12 DIAGNOSIS — R110 Nausea: Secondary | ICD-10-CM | POA: Diagnosis not present

## 2017-12-12 DIAGNOSIS — D649 Anemia, unspecified: Secondary | ICD-10-CM

## 2017-12-12 DIAGNOSIS — D500 Iron deficiency anemia secondary to blood loss (chronic): Secondary | ICD-10-CM

## 2017-12-12 DIAGNOSIS — R6883 Chills (without fever): Secondary | ICD-10-CM | POA: Diagnosis not present

## 2017-12-12 DIAGNOSIS — Z79899 Other long term (current) drug therapy: Secondary | ICD-10-CM | POA: Diagnosis not present

## 2017-12-12 DIAGNOSIS — D509 Iron deficiency anemia, unspecified: Secondary | ICD-10-CM | POA: Diagnosis not present

## 2017-12-12 LAB — SAMPLE TO BLOOD BANK

## 2017-12-12 LAB — CBC WITH DIFFERENTIAL (CANCER CENTER ONLY)
BASOS ABS: 0 10*3/uL (ref 0.0–0.1)
BASOS PCT: 1 %
EOS PCT: 1 %
Eosinophils Absolute: 0 10*3/uL (ref 0.0–0.5)
HCT: 24.3 % — ABNORMAL LOW (ref 38.4–49.9)
Hemoglobin: 7.3 g/dL — ABNORMAL LOW (ref 13.0–17.1)
Lymphocytes Relative: 4 %
Lymphs Abs: 0.3 10*3/uL — ABNORMAL LOW (ref 0.9–3.3)
MCH: 23.2 pg — ABNORMAL LOW (ref 27.2–33.4)
MCHC: 30 g/dL — ABNORMAL LOW (ref 32.0–36.0)
MCV: 77.4 fL — ABNORMAL LOW (ref 79.3–98.0)
MONO ABS: 0.7 10*3/uL (ref 0.1–0.9)
Monocytes Relative: 8 %
NEUTROS ABS: 7 10*3/uL — AB (ref 1.5–6.5)
Neutrophils Relative %: 86 %
Platelet Count: 149 10*3/uL (ref 140–400)
RBC: 3.14 MIL/uL — AB (ref 4.20–5.82)
RDW: 23.5 % — ABNORMAL HIGH (ref 11.0–14.6)
WBC: 8.1 10*3/uL (ref 4.0–10.3)

## 2017-12-12 LAB — PREPARE RBC (CROSSMATCH)

## 2017-12-12 MED ORDER — ACETAMINOPHEN 325 MG PO TABS
650.0000 mg | ORAL_TABLET | Freq: Once | ORAL | Status: AC
  Administered 2017-12-12: 650 mg via ORAL

## 2017-12-12 MED ORDER — DIPHENHYDRAMINE HCL 25 MG PO CAPS
25.0000 mg | ORAL_CAPSULE | Freq: Once | ORAL | Status: AC
  Administered 2017-12-12: 25 mg via ORAL

## 2017-12-12 MED ORDER — SODIUM CHLORIDE 0.9 % IV SOLN
250.0000 mL | Freq: Once | INTRAVENOUS | Status: AC
  Administered 2017-12-12: 250 mL via INTRAVENOUS

## 2017-12-12 MED ORDER — DIPHENHYDRAMINE HCL 25 MG PO CAPS
ORAL_CAPSULE | ORAL | Status: AC
  Filled 2017-12-12: qty 1

## 2017-12-12 MED ORDER — ACETAMINOPHEN 325 MG PO TABS
ORAL_TABLET | ORAL | Status: AC
Start: 2017-12-12 — End: ?
  Filled 2017-12-12: qty 2

## 2017-12-12 NOTE — Patient Instructions (Signed)

## 2017-12-13 LAB — TYPE AND SCREEN
ABO/RH(D): A POS
Antibody Screen: POSITIVE
Donor AG Type: NEGATIVE
Unit division: 0

## 2017-12-13 LAB — BPAM RBC
BLOOD PRODUCT EXPIRATION DATE: 201906202359
ISSUE DATE / TIME: 201905211451
Unit Type and Rh: 5100

## 2017-12-14 ENCOUNTER — Other Ambulatory Visit: Payer: Self-pay | Admitting: Hematology and Oncology

## 2017-12-14 ENCOUNTER — Other Ambulatory Visit: Payer: Self-pay

## 2017-12-14 MED ORDER — PROCHLORPERAZINE MALEATE 10 MG PO TABS: 10.0000 mg | ORAL_TABLET | Freq: Four times a day (QID) | ORAL | 0 refills | Status: DC | PRN

## 2017-12-14 MED ORDER — ONDANSETRON HCL 8 MG PO TABS: 8.0000 mg | ORAL_TABLET | Freq: Three times a day (TID) | ORAL | 0 refills | Status: AC | PRN

## 2017-12-19 ENCOUNTER — Other Ambulatory Visit: Payer: Self-pay

## 2017-12-19 ENCOUNTER — Ambulatory Visit (HOSPITAL_COMMUNITY)
Admission: RE | Admit: 2017-12-19 | Discharge: 2017-12-19 | Disposition: A | Discharge status: Home / Self Care | Payer: Medicare Other | Source: Ambulatory Visit | Attending: Hematology and Oncology | Admitting: Hematology and Oncology

## 2017-12-19 ENCOUNTER — Inpatient Hospital Stay: Payer: Medicare Other

## 2017-12-19 DIAGNOSIS — D500 Iron deficiency anemia secondary to blood loss (chronic): Secondary | ICD-10-CM

## 2017-12-19 DIAGNOSIS — D649 Anemia, unspecified: Secondary | ICD-10-CM | POA: Insufficient documentation

## 2017-12-19 LAB — CBC WITH DIFFERENTIAL (CANCER CENTER ONLY)
Basophils Absolute: 0.1 10*3/uL (ref 0.0–0.1)
Basophils Relative: 1 %
EOS ABS: 0.1 10*3/uL (ref 0.0–0.5)
Eosinophils Relative: 1 %
HCT: 21.7 % — ABNORMAL LOW (ref 38.4–49.9)
HEMOGLOBIN: 6.7 g/dL — AB (ref 13.0–17.1)
LYMPHS ABS: 0.7 10*3/uL — AB (ref 0.9–3.3)
Lymphocytes Relative: 7 %
MCH: 24 pg — AB (ref 27.2–33.4)
MCHC: 30.9 g/dL — AB (ref 32.0–36.0)
MCV: 77.8 fL — ABNORMAL LOW (ref 79.3–98.0)
MONOS PCT: 7 %
Monocytes Absolute: 0.7 10*3/uL (ref 0.1–0.9)
NEUTROS PCT: 84 %
Neutro Abs: 8.5 10*3/uL — ABNORMAL HIGH (ref 1.5–6.5)
Platelet Count: 169 10*3/uL (ref 140–400)
RBC: 2.79 MIL/uL — ABNORMAL LOW (ref 4.20–5.82)
RDW: 24.8 % — ABNORMAL HIGH (ref 11.0–14.6)
WBC Count: 10 10*3/uL (ref 4.0–10.3)

## 2017-12-19 LAB — SAMPLE TO BLOOD BANK

## 2017-12-19 LAB — PREPARE RBC (CROSSMATCH)

## 2017-12-19 MED ORDER — SODIUM CHLORIDE 0.9% FLUSH: 10.0000 mL | INTRAVENOUS | Status: DC | PRN

## 2017-12-19 MED ORDER — HEPARIN SOD (PORK) LOCK FLUSH 100 UNIT/ML IV SOLN: 500.0000 [IU] | Freq: Every day | INTRAVENOUS | Status: DC | PRN

## 2017-12-19 MED ORDER — SODIUM CHLORIDE 0.9 % IV SOLN: 250.0000 mL | Freq: Once | INTRAVENOUS | Status: DC

## 2017-12-19 MED ORDER — FUROSEMIDE 10 MG/ML IJ SOLN
40.0000 mg | Freq: Once | INTRAMUSCULAR | Status: AC
  Administered 2017-12-19: 40 mg via INTRAVENOUS
  Filled 2017-12-19: qty 4

## 2017-12-19 MED ORDER — DIPHENHYDRAMINE HCL 25 MG PO CAPS
25.0000 mg | ORAL_CAPSULE | Freq: Once | ORAL | Status: AC
  Administered 2017-12-19: 25 mg via ORAL
  Filled 2017-12-19: qty 1

## 2017-12-19 MED ORDER — ACETAMINOPHEN 325 MG PO TABS
650.0000 mg | ORAL_TABLET | Freq: Once | ORAL | Status: AC
  Administered 2017-12-19: 650 mg via ORAL
  Filled 2017-12-19: qty 2

## 2017-12-19 NOTE — Discharge Instructions (Signed)
Blood Transfusion, Adult, Care After This sheet gives you information about how to care for yourself after your procedure. Your health care provider may also give you more specific instructions. If you have problems or questions, contact your health care provider. What can I expect after the procedure? After your procedure, it is common to have:  Bruising and soreness where the IV tube was inserted.  Headache.  Follow these instructions at home:  Take over-the-counter and prescription medicines only as told by your health care provider.  Return to your normal activities as told by your health care provider.  Follow instructions from your health care provider about how to take care of your IV insertion site. Make sure you: ? Wash your hands with soap and water before you change your bandage (dressing). If soap and water are not available, use hand sanitizer. ? Change your dressing as told by your health care provider.  Check your IV insertion site every day for signs of infection. Check for: ? More redness, swelling, or pain. ? More fluid or blood. ? Warmth. ? Pus or a bad smell. Contact a health care provider if:  You have more redness, swelling, or pain around the IV insertion site.  You have more fluid or blood coming from the IV insertion site.  Your IV insertion site feels warm to the touch.  You have pus or a bad smell coming from the IV insertion site.  Your urine turns pink, red, or brown.  You feel weak after doing your normal activities. Get help right away if:  You have signs of a serious allergic or immune system reaction, including: ? Itchiness. ? Hives. ? Trouble breathing. ? Anxiety. ? Chest or lower back pain. ? Fever, flushing, and chills. ? Rapid pulse. ? Rash. ? Diarrhea. ? Vomiting. ? Dark urine. ? Serious headache. ? Dizziness. ? Stiff neck. ? Yellow coloration of the face or the white parts of the eyes (jaundice). This information is not  intended to replace advice given to you by your health care provider. Make sure you discuss any questions you have with your health care provider. Document Released: 08/01/2014 Document Revised: 03/09/2016 Document Reviewed: 01/25/2016 Elsevier Interactive Patient Education  2018 Elsevier Inc.  

## 2017-12-19 NOTE — Progress Notes (Signed)
PATIENT CARE CENTER NOTE  Diagnosis: Iron Deficiency Anemia    Provider: Dr. Lebron Conners   Procedure: 2 units PRBC's    Note: Patient received 2 units of blood. Pre-medications given before transfusion. Patient tolerated transfusion well with no adverse reaction. Discharge instructions given to patient and patient's wife. Patient alert, oriented and ambulatory to wheelchair at discharge.

## 2017-12-20 ENCOUNTER — Telehealth (INDEPENDENT_AMBULATORY_CARE_PROVIDER_SITE_OTHER): Payer: Self-pay | Admitting: Internal Medicine

## 2017-12-20 LAB — TYPE AND SCREEN
ABO/RH(D): A POS
Antibody Screen: POSITIVE
DONOR AG TYPE: NEGATIVE
Donor AG Type: NEGATIVE
UNIT DIVISION: 0
Unit division: 0

## 2017-12-20 LAB — BPAM RBC
Blood Product Expiration Date: 201906102359
Blood Product Expiration Date: 201906102359
ISSUE DATE / TIME: 201905281154
ISSUE DATE / TIME: 201905281154
Unit Type and Rh: 6200
Unit Type and Rh: 6200

## 2017-12-20 NOTE — Telephone Encounter (Signed)
Terri wanted me to forward this to you

## 2017-12-20 NOTE — Telephone Encounter (Signed)
Patients wife called regarding patients blood count being 6.7 as of yesterday - patient is worried about the continued blood loss - would like Dr Laural Golden to call ph# [phone]

## 2017-12-20 NOTE — Telephone Encounter (Signed)
Mitzie, route this to Dr. Laural Golden

## 2017-12-21 ENCOUNTER — Other Ambulatory Visit: Payer: Self-pay

## 2017-12-21 ENCOUNTER — Emergency Department (HOSPITAL_COMMUNITY): Payer: Medicare Other

## 2017-12-21 ENCOUNTER — Encounter (HOSPITAL_COMMUNITY): Payer: Self-pay

## 2017-12-21 ENCOUNTER — Inpatient Hospital Stay (HOSPITAL_COMMUNITY)
Admission: EM | Admit: 2017-12-21 | Discharge: 2017-12-27 | DRG: 433 | Disposition: A | Discharge status: Home / Self Care | Payer: Medicare Other | Attending: Internal Medicine | Admitting: Internal Medicine

## 2017-12-21 DIAGNOSIS — K7290 Hepatic failure, unspecified without coma: Secondary | ICD-10-CM | POA: Diagnosis present

## 2017-12-21 DIAGNOSIS — Z885 Allergy status to narcotic agent status: Secondary | ICD-10-CM

## 2017-12-21 DIAGNOSIS — D509 Iron deficiency anemia, unspecified: Secondary | ICD-10-CM | POA: Diagnosis present

## 2017-12-21 DIAGNOSIS — N184 Chronic kidney disease, stage 4 (severe): Secondary | ICD-10-CM | POA: Diagnosis not present

## 2017-12-21 DIAGNOSIS — Z794 Long term (current) use of insulin: Secondary | ICD-10-CM

## 2017-12-21 DIAGNOSIS — E785 Hyperlipidemia, unspecified: Secondary | ICD-10-CM | POA: Diagnosis present

## 2017-12-21 DIAGNOSIS — R5383 Other fatigue: Secondary | ICD-10-CM | POA: Diagnosis not present

## 2017-12-21 DIAGNOSIS — R627 Adult failure to thrive: Secondary | ICD-10-CM | POA: Diagnosis present

## 2017-12-21 DIAGNOSIS — G4733 Obstructive sleep apnea (adult) (pediatric): Secondary | ICD-10-CM | POA: Diagnosis not present

## 2017-12-21 DIAGNOSIS — E871 Hypo-osmolality and hyponatremia: Secondary | ICD-10-CM | POA: Diagnosis not present

## 2017-12-21 DIAGNOSIS — I130 Hypertensive heart and chronic kidney disease with heart failure and stage 1 through stage 4 chronic kidney disease, or unspecified chronic kidney disease: Secondary | ICD-10-CM | POA: Diagnosis present

## 2017-12-21 DIAGNOSIS — Z952 Presence of prosthetic heart valve: Secondary | ICD-10-CM

## 2017-12-21 DIAGNOSIS — Z888 Allergy status to other drugs, medicaments and biological substances status: Secondary | ICD-10-CM

## 2017-12-21 DIAGNOSIS — Z833 Family history of diabetes mellitus: Secondary | ICD-10-CM

## 2017-12-21 DIAGNOSIS — Q2730 Arteriovenous malformation, site unspecified: Secondary | ICD-10-CM

## 2017-12-21 DIAGNOSIS — M1990 Unspecified osteoarthritis, unspecified site: Secondary | ICD-10-CM | POA: Diagnosis present

## 2017-12-21 DIAGNOSIS — M109 Gout, unspecified: Secondary | ICD-10-CM | POA: Diagnosis present

## 2017-12-21 DIAGNOSIS — Z9119 Patient's noncompliance with other medical treatment and regimen: Secondary | ICD-10-CM

## 2017-12-21 DIAGNOSIS — D500 Iron deficiency anemia secondary to blood loss (chronic): Secondary | ICD-10-CM

## 2017-12-21 DIAGNOSIS — I10 Essential (primary) hypertension: Secondary | ICD-10-CM | POA: Diagnosis not present

## 2017-12-21 DIAGNOSIS — N183 Chronic kidney disease, stage 3 unspecified: Secondary | ICD-10-CM

## 2017-12-21 DIAGNOSIS — R109 Unspecified abdominal pain: Secondary | ICD-10-CM

## 2017-12-21 DIAGNOSIS — E8779 Other fluid overload: Secondary | ICD-10-CM | POA: Diagnosis not present

## 2017-12-21 DIAGNOSIS — D631 Anemia in chronic kidney disease: Secondary | ICD-10-CM | POA: Diagnosis present

## 2017-12-21 DIAGNOSIS — D649 Anemia, unspecified: Secondary | ICD-10-CM

## 2017-12-21 DIAGNOSIS — Z886 Allergy status to analgesic agent status: Secondary | ICD-10-CM

## 2017-12-21 DIAGNOSIS — Z66 Do not resuscitate: Secondary | ICD-10-CM | POA: Diagnosis present

## 2017-12-21 DIAGNOSIS — Z825 Family history of asthma and other chronic lower respiratory diseases: Secondary | ICD-10-CM

## 2017-12-21 DIAGNOSIS — K7460 Unspecified cirrhosis of liver: Secondary | ICD-10-CM | POA: Diagnosis not present

## 2017-12-21 DIAGNOSIS — Z951 Presence of aortocoronary bypass graft: Secondary | ICD-10-CM

## 2017-12-21 DIAGNOSIS — J449 Chronic obstructive pulmonary disease, unspecified: Secondary | ICD-10-CM | POA: Diagnosis present

## 2017-12-21 DIAGNOSIS — Z950 Presence of cardiac pacemaker: Secondary | ICD-10-CM

## 2017-12-21 DIAGNOSIS — I5032 Chronic diastolic (congestive) heart failure: Secondary | ICD-10-CM | POA: Diagnosis not present

## 2017-12-21 DIAGNOSIS — Z8249 Family history of ischemic heart disease and other diseases of the circulatory system: Secondary | ICD-10-CM

## 2017-12-21 DIAGNOSIS — E1122 Type 2 diabetes mellitus with diabetic chronic kidney disease: Secondary | ICD-10-CM | POA: Diagnosis present

## 2017-12-21 DIAGNOSIS — K219 Gastro-esophageal reflux disease without esophagitis: Secondary | ICD-10-CM | POA: Diagnosis present

## 2017-12-21 DIAGNOSIS — D696 Thrombocytopenia, unspecified: Secondary | ICD-10-CM | POA: Diagnosis present

## 2017-12-21 DIAGNOSIS — Z961 Presence of intraocular lens: Secondary | ICD-10-CM | POA: Diagnosis present

## 2017-12-21 DIAGNOSIS — I482 Chronic atrial fibrillation: Secondary | ICD-10-CM | POA: Diagnosis present

## 2017-12-21 DIAGNOSIS — I509 Heart failure, unspecified: Secondary | ICD-10-CM

## 2017-12-21 DIAGNOSIS — Z515 Encounter for palliative care: Secondary | ICD-10-CM

## 2017-12-21 DIAGNOSIS — E119 Type 2 diabetes mellitus without complications: Secondary | ICD-10-CM

## 2017-12-21 DIAGNOSIS — E1151 Type 2 diabetes mellitus with diabetic peripheral angiopathy without gangrene: Secondary | ICD-10-CM | POA: Diagnosis present

## 2017-12-21 DIAGNOSIS — E8770 Fluid overload, unspecified: Secondary | ICD-10-CM | POA: Diagnosis present

## 2017-12-21 DIAGNOSIS — R0602 Shortness of breath: Secondary | ICD-10-CM | POA: Diagnosis not present

## 2017-12-21 DIAGNOSIS — Z6841 Body Mass Index (BMI) 40.0 and over, adult: Secondary | ICD-10-CM

## 2017-12-21 DIAGNOSIS — I2510 Atherosclerotic heart disease of native coronary artery without angina pectoris: Secondary | ICD-10-CM | POA: Diagnosis present

## 2017-12-21 DIAGNOSIS — F329 Major depressive disorder, single episode, unspecified: Secondary | ICD-10-CM | POA: Diagnosis present

## 2017-12-21 DIAGNOSIS — Z87891 Personal history of nicotine dependence: Secondary | ICD-10-CM

## 2017-12-21 DIAGNOSIS — N179 Acute kidney failure, unspecified: Secondary | ICD-10-CM | POA: Diagnosis present

## 2017-12-21 DIAGNOSIS — E669 Obesity, unspecified: Secondary | ICD-10-CM | POA: Diagnosis present

## 2017-12-21 DIAGNOSIS — Z9889 Other specified postprocedural states: Secondary | ICD-10-CM

## 2017-12-21 DIAGNOSIS — G4700 Insomnia, unspecified: Secondary | ICD-10-CM | POA: Diagnosis present

## 2017-12-21 DIAGNOSIS — Z9841 Cataract extraction status, right eye: Secondary | ICD-10-CM

## 2017-12-21 DIAGNOSIS — Z955 Presence of coronary angioplasty implant and graft: Secondary | ICD-10-CM

## 2017-12-21 DIAGNOSIS — N189 Chronic kidney disease, unspecified: Secondary | ICD-10-CM

## 2017-12-21 DIAGNOSIS — Z9842 Cataract extraction status, left eye: Secondary | ICD-10-CM

## 2017-12-21 DIAGNOSIS — Z79899 Other long term (current) drug therapy: Secondary | ICD-10-CM

## 2017-12-21 DIAGNOSIS — R188 Other ascites: Secondary | ICD-10-CM | POA: Diagnosis present

## 2017-12-21 LAB — BASIC METABOLIC PANEL
Anion gap: 15 (ref 5–15)
BUN: 92 mg/dL — ABNORMAL HIGH (ref 6–20)
CO2: 23 mmol/L (ref 22–32)
Calcium: 8.5 mg/dL — ABNORMAL LOW (ref 8.9–10.3)
Chloride: 89 mmol/L — ABNORMAL LOW (ref 101–111)
Creatinine, Ser: 3.38 mg/dL — ABNORMAL HIGH (ref 0.61–1.24)
GFR calc Af Amer: 20 mL/min — ABNORMAL LOW (ref >60)
GFR calc non Af Amer: 17 mL/min — ABNORMAL LOW (ref >60)
Glucose, Bld: 127 mg/dL — ABNORMAL HIGH (ref 65–99)
Potassium: 3.5 mmol/L (ref 3.5–5.1)
Sodium: 127 mmol/L — ABNORMAL LOW (ref 135–145)

## 2017-12-21 LAB — CBC
HCT: 25.6 % — ABNORMAL LOW (ref 39.0–52.0)
HEMOGLOBIN: 8.2 g/dL — AB (ref 13.0–17.0)
MCH: 25.6 pg — AB (ref 26.0–34.0)
MCHC: 32 g/dL (ref 30.0–36.0)
MCV: 80 fL (ref 78.0–100.0)
Platelets: 150 10*3/uL (ref 150–400)
RBC: 3.2 MIL/uL — ABNORMAL LOW (ref 4.22–5.81)
RDW: 23.1 % — ABNORMAL HIGH (ref 11.5–15.5)
WBC: 8.2 10*3/uL (ref 4.0–10.5)

## 2017-12-21 LAB — HEMOGLOBIN A1C
HEMOGLOBIN A1C: 5.1 % (ref 4.8–5.6)
MEAN PLASMA GLUCOSE: 99.67 mg/dL

## 2017-12-21 LAB — GLUCOSE, CAPILLARY: GLUCOSE-CAPILLARY: 113 mg/dL — AB (ref 65–99)

## 2017-12-21 LAB — I-STAT TROPONIN, ED: Troponin i, poc: 0.04 ng/mL (ref 0.00–0.08)

## 2017-12-21 MED ORDER — PANTOPRAZOLE SODIUM 40 MG PO TBEC
40.0000 mg | DELAYED_RELEASE_TABLET | Freq: Every day | ORAL | Status: DC
  Administered 2017-12-22 – 2017-12-27 (×6): 40 mg via ORAL
  Filled 2017-12-21 (×6): qty 1

## 2017-12-21 MED ORDER — SERTRALINE HCL 50 MG PO TABS
50.0000 mg | ORAL_TABLET | Freq: Every day | ORAL | Status: DC
  Administered 2017-12-22 – 2017-12-27 (×6): 50 mg via ORAL
  Filled 2017-12-21 (×6): qty 1

## 2017-12-21 MED ORDER — SODIUM CHLORIDE 0.9% FLUSH
3.0000 mL | Freq: Two times a day (BID) | INTRAVENOUS | Status: DC
  Administered 2017-12-22 – 2017-12-27 (×9): 3 mL via INTRAVENOUS

## 2017-12-21 MED ORDER — INSULIN NPH (HUMAN) (ISOPHANE) 100 UNIT/ML ~~LOC~~ SUSP
65.0000 [IU] | Freq: Every day | SUBCUTANEOUS | Status: DC
  Administered 2017-12-22: 65 [IU] via SUBCUTANEOUS

## 2017-12-21 MED ORDER — ACETAMINOPHEN 325 MG PO TABS
650.0000 mg | ORAL_TABLET | Freq: Four times a day (QID) | ORAL | Status: DC | PRN
  Administered 2017-12-21 – 2017-12-26 (×3): 650 mg via ORAL
  Filled 2017-12-21 (×3): qty 2

## 2017-12-21 MED ORDER — INSULIN NPH (HUMAN) (ISOPHANE) 100 UNIT/ML ~~LOC~~ SUSP
65.0000 [IU] | Freq: Every day | SUBCUTANEOUS | Status: DC
  Administered 2017-12-22: 65 [IU] via SUBCUTANEOUS
  Filled 2017-12-21 (×2): qty 10

## 2017-12-21 MED ORDER — SODIUM CHLORIDE 0.9% FLUSH: 3.0000 mL | INTRAVENOUS | Status: DC | PRN

## 2017-12-21 MED ORDER — ENOXAPARIN SODIUM 30 MG/0.3ML ~~LOC~~ SOLN
30.0000 mg | SUBCUTANEOUS | Status: DC
  Administered 2017-12-21 – 2017-12-22 (×2): 30 mg via SUBCUTANEOUS
  Filled 2017-12-21 (×2): qty 0.3

## 2017-12-21 MED ORDER — SORBITOL 70 % SOLN
30.0000 mL | Status: DC | PRN
  Administered 2017-12-25: 30 mL via ORAL
  Filled 2017-12-21 (×2): qty 30

## 2017-12-21 MED ORDER — ONDANSETRON HCL 4 MG/2ML IJ SOLN
4.0000 mg | Freq: Four times a day (QID) | INTRAMUSCULAR | Status: DC | PRN
  Administered 2017-12-25: 4 mg via INTRAVENOUS
  Filled 2017-12-21: qty 2

## 2017-12-21 MED ORDER — CAMPHOR-MENTHOL 0.5-0.5 % EX LOTN
1.0000 "application " | TOPICAL_LOTION | Freq: Three times a day (TID) | CUTANEOUS | Status: DC | PRN
  Filled 2017-12-21: qty 222

## 2017-12-21 MED ORDER — TAMSULOSIN HCL 0.4 MG PO CAPS
0.4000 mg | ORAL_CAPSULE | Freq: Every day | ORAL | Status: DC
  Administered 2017-12-21 – 2017-12-27 (×7): 0.4 mg via ORAL
  Filled 2017-12-21 (×7): qty 1

## 2017-12-21 MED ORDER — ACETAMINOPHEN 650 MG RE SUPP: 650.0000 mg | Freq: Four times a day (QID) | RECTAL | Status: DC | PRN

## 2017-12-21 MED ORDER — ONDANSETRON HCL 4 MG/2ML IJ SOLN: 4.0000 mg | Freq: Four times a day (QID) | INTRAMUSCULAR | Status: DC | PRN

## 2017-12-21 MED ORDER — DOCUSATE SODIUM 283 MG RE ENEM
1.0000 | ENEMA | RECTAL | Status: DC | PRN
  Filled 2017-12-21: qty 1

## 2017-12-21 MED ORDER — SODIUM CHLORIDE 0.9 % IV SOLN
250.0000 mL | INTRAVENOUS | Status: DC | PRN
Start: 2017-12-21 — End: 2017-12-27

## 2017-12-21 MED ORDER — COLCHICINE 0.6 MG PO TABS
0.6000 mg | ORAL_TABLET | Freq: Two times a day (BID) | ORAL | Status: DC
Start: 2017-12-21 — End: 2017-12-27
  Administered 2017-12-21 – 2017-12-27 (×12): 0.6 mg via ORAL
  Filled 2017-12-21 (×12): qty 1

## 2017-12-21 MED ORDER — INSULIN ASPART 100 UNIT/ML ~~LOC~~ SOLN: 0.0000 [IU] | Freq: Every day | SUBCUTANEOUS | Status: DC

## 2017-12-21 MED ORDER — INSULIN NPH (HUMAN) (ISOPHANE) 100 UNIT/ML ~~LOC~~ SUSP
65.0000 [IU] | Freq: Every day | SUBCUTANEOUS | Status: DC
  Filled 2017-12-21: qty 10

## 2017-12-21 MED ORDER — ZOLPIDEM TARTRATE 5 MG PO TABS
5.0000 mg | ORAL_TABLET | Freq: Every evening | ORAL | Status: DC | PRN
  Administered 2017-12-22 – 2017-12-27 (×3): 5 mg via ORAL
  Filled 2017-12-21 (×3): qty 1

## 2017-12-21 MED ORDER — LIDOCAINE HCL URETHRAL/MUCOSAL 2 % EX GEL
1.0000 "application " | CUTANEOUS | Status: AC
  Filled 2017-12-21: qty 20

## 2017-12-21 MED ORDER — ALLOPURINOL 100 MG PO TABS
100.0000 mg | ORAL_TABLET | Freq: Every day | ORAL | Status: DC
  Administered 2017-12-22 – 2017-12-27 (×6): 100 mg via ORAL
  Filled 2017-12-21 (×6): qty 1

## 2017-12-21 MED ORDER — FUROSEMIDE 10 MG/ML IJ SOLN
40.0000 mg | Freq: Once | INTRAMUSCULAR | Status: AC
  Administered 2017-12-21: 40 mg via INTRAVENOUS
  Filled 2017-12-21: qty 4

## 2017-12-21 MED ORDER — GABAPENTIN 300 MG PO CAPS
300.0000 mg | ORAL_CAPSULE | Freq: Four times a day (QID) | ORAL | Status: DC
  Administered 2017-12-21 – 2017-12-27 (×24): 300 mg via ORAL
  Filled 2017-12-21 (×25): qty 1

## 2017-12-21 MED ORDER — POTASSIUM CHLORIDE CRYS ER 20 MEQ PO TBCR
40.0000 meq | EXTENDED_RELEASE_TABLET | Freq: Two times a day (BID) | ORAL | Status: DC
  Administered 2017-12-21 – 2017-12-25 (×8): 40 meq via ORAL
  Filled 2017-12-21 (×8): qty 2

## 2017-12-21 MED ORDER — INSULIN ASPART 100 UNIT/ML ~~LOC~~ SOLN: 0.0000 [IU] | Freq: Three times a day (TID) | SUBCUTANEOUS | Status: DC

## 2017-12-21 MED ORDER — CALCIUM CARBONATE ANTACID 1250 MG/5ML PO SUSP
500.0000 mg | Freq: Four times a day (QID) | ORAL | Status: DC | PRN
  Administered 2017-12-26: 500 mg via ORAL
  Filled 2017-12-21 (×2): qty 5

## 2017-12-21 MED ORDER — METOLAZONE 5 MG PO TABS
5.0000 mg | ORAL_TABLET | ORAL | Status: DC
  Administered 2017-12-24 – 2017-12-26 (×2): 5 mg via ORAL
  Filled 2017-12-21 (×3): qty 1

## 2017-12-21 MED ORDER — ONDANSETRON HCL 4 MG PO TABS
4.0000 mg | ORAL_TABLET | Freq: Four times a day (QID) | ORAL | Status: DC | PRN
  Administered 2017-12-24 – 2017-12-25 (×2): 4 mg via ORAL
  Filled 2017-12-21 (×2): qty 1

## 2017-12-21 MED ORDER — INSULIN ASPART 100 UNIT/ML ~~LOC~~ SOLN
0.0000 [IU] | Freq: Three times a day (TID) | SUBCUTANEOUS | Status: DC
  Administered 2017-12-22 (×2): 1 [IU] via SUBCUTANEOUS
  Administered 2017-12-22: 2 [IU] via SUBCUTANEOUS
  Administered 2017-12-24: 1 [IU] via SUBCUTANEOUS
  Administered 2017-12-24: 2 [IU] via SUBCUTANEOUS
  Administered 2017-12-24 – 2017-12-25 (×2): 1 [IU] via SUBCUTANEOUS
  Administered 2017-12-25: 2 [IU] via SUBCUTANEOUS
  Administered 2017-12-26 (×2): 1 [IU] via SUBCUTANEOUS

## 2017-12-21 MED ORDER — ATORVASTATIN CALCIUM 20 MG PO TABS
20.0000 mg | ORAL_TABLET | Freq: Every day | ORAL | Status: DC
  Administered 2017-12-22 – 2017-12-25 (×4): 20 mg via ORAL
  Filled 2017-12-21 (×4): qty 1

## 2017-12-21 MED ORDER — HYDROXYZINE HCL 25 MG PO TABS: 25.0000 mg | ORAL_TABLET | Freq: Three times a day (TID) | ORAL | Status: DC | PRN

## 2017-12-21 MED ORDER — FUROSEMIDE 10 MG/ML IJ SOLN
40.0000 mg | Freq: Two times a day (BID) | INTRAMUSCULAR | Status: DC
  Administered 2017-12-22 – 2017-12-23 (×3): 40 mg via INTRAVENOUS
  Filled 2017-12-21 (×3): qty 4

## 2017-12-21 MED ORDER — METOPROLOL SUCCINATE ER 25 MG PO TB24
25.0000 mg | ORAL_TABLET | Freq: Two times a day (BID) | ORAL | Status: DC
  Administered 2017-12-21 – 2017-12-22 (×3): 25 mg via ORAL
  Filled 2017-12-21 (×4): qty 1

## 2017-12-21 NOTE — ED Provider Notes (Deleted)
[facility] EMERGENCY DEPARTMENT Provider Note   CSN: [id number] Arrival date & time: 12/21/17  1148     History   Chief Complaint Chief Complaint  Patient presents with  . Abnormal Lab  . Weakness    HPI JAMONI HEWES is a 72 y.o. male.  HPI  Past Medical History:  Diagnosis Date  . Aortic stenosis 03/08/2012   a.  s/p tissue AVR 10/13 with Dr. [name];   b. Echo 10/13: mod LVH, EF 55-60%, tissue AVR not well seen, no leak, gradient not too high (mean 4mHg), MAC, mild MR, mild LAE, PASP 38  . Ascites    status post paracentesis with removal of 3.4 L of ascitic fluid  . Atrial fibrillation ([place]    Permanent; off of Coumadin for now due to GI bleed  . AVM (arteriovenous malformation)    Recurrent GI bleeding requiring multiple transfusions  . CAD (coronary artery disease)    a. s/p CABG 2004;  b. LImbery10/13:  LHC 8/13: Free radial to obtuse marginal patent, SVG-diagonal patent, LIMA-LAD patent, EF 65-70%, mean aortic valve gradient 42  . Carotid bruit 06/14/2011   a. pre-AVR dopplers 10/13: no sig ICA stenosis  . Chronic diastolic heart failure ([facility]   . Chronic kidney disease   . Cirrhosis ([place]   . COPD (chronic obstructive pulmonary disease) ([place]   . Depression   . DM2 (diabetes mellitus, type 2) (HCC)    at least 10 yrs  . Dyspnea   . GERD (gastroesophageal reflux disease)   . Gout   . H/O hiatal hernia   . Headache   . Hyperlipidemia   . Hyperlipidemia   . Hypertension    x 15 yrs  . Insomnia   . Iron deficiency anemia    Requiring intravenous iron  . Mediastinal adenopathy 09/22/2011  . OSA (obstructive sleep apnea) 1999    USES CPAP  . Osteoarthritis   . Overweight(278.02)   . Presence of permanent cardiac pacemaker   . Thrombocytopenia ([facility]     Patient Active Problem List   Diagnosis Date Noted  . Symptomatic anemia 09/07/2017  . COPD (chronic obstructive pulmonary disease) ([place] 09/07/2017  . Gout 09/07/2017  .  Palliative care encounter   . Goals of care, counseling/discussion   . Encounter for hospice care discussion   . Elevated troponin   . Acute blood loss anemia 08/24/2017  . Upper GI bleed 08/24/2017  . Cirrhosis of liver with ascites (HSundance 08/24/2017  . Chronic diastolic CHF (congestive heart failure) (HRug[place] 08/24/2017  . Acute renal failure superimposed on stage 4 chronic kidney disease (HEl Reno 08/24/2017  . Hyponatremia 08/24/2017  . Gastrointestinal hemorrhage   . Anemia due to chronic renal failure treated with erythropoietin, stage 3 (moderate) ([facility]ild[place]ar 04/25/2017  . Blood in stool 10/20/2016  . Polydipsia 11/11/2015  . Acute on chronic diastolic CHF (congestive heart failure) (HG[place] 03/19/2015  . OSA (obstructive sleep apnea) 09/23/2014  . S/P AV nodal ablation 04/18/14 04/19/2014  . Aortic stenosis, severe with S/P AVR  04/19/2014  . Atrioventricular block, complete (H[place] 04/18/2014  . CKD (chronic kidney disease), stage IV (HPer[place] 11/05/2013  . Unspecified gastritis and gastroduodenitis without mention of hemorrhage 07/31/2013  . Cough 06/28/2012  . Dyspnea 06/27/2012  . Permanent atrial fibrillation (H[place] 03/02/2012  . History of GI bleed 03/02/2012  . GERD (gastroesophageal reflux disease) 03/02/2012  . Type 2 diabetes mellitus (HElmira 03/02/2012  . Cirrhosis (HEdwards   . Mediastinal adenopathy 09/22/2011  .  Osteoarthritis   . Thrombocytopenia ([place])   . Gynecomastia 09/20/2011  . Ascites 09/20/2011  . Iron deficiency anemia   . Hyperlipidemia   . Essential hypertension   . CHF (congestive heart failure) ([place])   . 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 Carotid bruit 06/14/2011  . ANEMIA 05/17/2010  . Overweight 05/02/2009  . CAD, ARTERY BYPASS GRAFT 05/02/2009  . 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    Past Surgical History:  Procedure Laterality Date  . ABLATION  04-18-14   AVN ablation by Dr Caryl Comes  . AORTIC VALVE REPLACEMENT  04/25/2012   Procedure: AORTIC VALVE  REPLACEMENT (AVR);  Surgeon: Melrose Nakayama, MD;  Location: [facility];  Service: Open Heart Surgery;  Laterality: N/A;  . AV NODE ABLATION N/A 04/18/2014   Procedure: AV NODE ABLATION;  Surgeon: Deboraha Sprang, MD;  Location: [facility] CATH LAB;  Service: Cardiovascular;  Laterality: N/A;  . CARPAL TUNNEL RELEASE    10/08/2003  . CATARACT EXTRACTION W/ INTRAOCULAR LENS IMPLANT Bilateral 09/28/2015 , 10/19/2015  . COLONOSCOPY WITH PROPOFOL N/A 10/28/2016   Procedure: COLONOSCOPY WITH PROPOFOL;  Surgeon: Rogene Houston, MD;  [facility];  Service: Endoscopy;  Laterality: N/A;  . CORONARY ANGIOPLASTY WITH STENT PLACEMENT  01/19/2005   drug eluting stent to high grade ostial stenosis of radial artery graft to OM  . CORONARY ARTERY BYPASS GRAFT   10/15/2002    [name]. [name], M.D.     . ESOPHAGOGASTRODUODENOSCOPY N/A 07/31/2013   Procedure: ESOPHAGOGASTRODUODENOSCOPY (EGD);  Surgeon: Milus Banister, MD;  Location: [place];  Service: Endoscopy;  Laterality: N/A;  . ESOPHAGOGASTRODUODENOSCOPY Left 01/11/2014   Procedure: ESOPHAGOGASTRODUODENOSCOPY (EGD);  Surgeon: Juanita Craver, MD;  Location[facility] ENDOSCOPY;  Service: Endoscopy;  Laterality: Left;  . ESOPHAGOGASTRODUODENOSCOPY (EGD) WITH PROPOFOL N/A 10/28/2016   Procedure: ESOPHAGOGASTRODUODENOSCOPY (EGD) WITH PROPOFOL;  Surgeon: Rogene Houston, MD;  [facility];  Service: Endoscopy;  Laterality: N/A;  . ESOPHAGOGASTRODUODENOSCOPY (EGD) WITH PROPOFOL N/A 08/25/2017   Procedure: ESOPHAGOGASTRODUODENOSCOPY (EGD) WITH PROPOFOL;  Surgeon: Rogene Houston, MD;  [facility];  Service: Endoscopy;  Laterality: N/A;  . GIVENS CAPSULE STUDY N/A 08/26/2017   Procedure: GIVENS CAPSULE STUDY;  Surgeon: Danie Binder, MD;  [facility];  Service: Endoscopy;  Laterality: N/A;  . HERNIA REPAIR    . IR PARACENTESIS  09/14/2017  . KNEE ARTHROSCOPY Right 01/20/2016   Procedure: ARTHROSCOPY RIGHT KNEE WITH MENSICAL  DEBRIDEMENT;  Surgeon: Gaynelle Arabian, MD;  Location: [facility];  Service: Orthopedics;  Laterality: Right;  . LEFT AND RIGHT HEART CATHETERIZATION WITH CORONARY/GRAFT ANGIOGRAM N/A 03/07/2012   Procedure: LEFT AND RIGHT HEART CATHETERIZATION WITH Beatrix Fetters;  Surgeon: Sherren Mocha, MD;  Location: [facility] CATH LAB;  Service: Cardiovascular;  Laterality: N/A;  . lipoma surgery    . OTHER SURGICAL HISTORY  08/26/2011   [facility],  enteroscopy , revealing "three-four AVMs."   . PACEMAKER INSERTION  03-24-14   STJ Assurity single chamber pacemaker implanted by Dr Caryl Comes  . PARACENTESIS  12/2015  . PERMANENT PACEMAKER INSERTION N/A 03/24/2014   Procedure: PERMANENT PACEMAKER INSERTION;  Surgeon: Deboraha Sprang, MD;  Location: [facility] CATH LAB;  Service: Cardiovascular;  Laterality: N/A;  . POLYPECTOMY  10/28/2016   Procedure: POLYPECTOMY;  Surgeon: Rogene Houston, MD;  [facility];  Service: Endoscopy;;  gastric  . RIGHT HEART CATHETERIZATION N/A 01/01/2014   Procedure: RIGHT HEART CATH;  Surgeon: Jolaine Artist, MD;  Location:  [facility] CATH LAB;  Service: Cardiovascular;  Laterality: N/A;  . TEE WITHOUT CARDIOVERSION  03/07/2012   Procedure: TRANSESOPHAGEAL ECHOCARDIOGRAM (TEE);  Surgeon: Thayer Headings, MD;  Location: [place];  Service: Cardiovascular;  Laterality: N/A;        Home Medications    Prior to Admission medications   Medication Sig Start Date End Date Taking? Authorizing Provider  acetaminophen (TYLENOL) 500 MG tablet Take 1,000 mg by mouth at bedtime as needed for mild pain or headache.     [provider]  allopurinol (ZYLOPRIM) 100 MG tablet Take 1 tablet (100 mg total) by mouth daily. 04/18/17   Larey Dresser, MD  atorvastatin (LIPITOR) 20 MG tablet Take 20 mg by mouth daily at 6 PM.     [provider]  Chlorpheniramine Maleate (ALLERGY PO) Take 1 tablet by mouth daily.    [provider]  gabapentin (NEURONTIN) 300 MG capsule Take 300 mg  by mouth 3 (three) times daily. 12/31/14   [provider]  insulin NPH Human (HUMULIN N,NOVOLIN N) 100 UNIT/ML injection Inject 65 Units into the skin 2 (two) times daily before a meal.     [provider]  insulin regular (NOVOLIN R,HUMULIN R) 100 units/mL injection Inject 30 Units into the skin 2 (two) times daily before a meal.     [provider]  metolazone (ZAROXOLYN) 5 MG tablet Take 1 tablet (5 mg total) by mouth every other day. 10/05/17 01/03/18  Larey Dresser, MD  metolazone (ZAROXOLYN) 5 MG tablet TAKE 1 TABLET (5 MG TOTAL) BY MOUTH EVERY OTHER DAY. 12/06/17   Larey Dresser, MD  metoprolol succinate (TOPROL-XL) 25 MG 24 hr tablet Take 1 tablet (25 mg total) by mouth 2 (two) times daily. 11/01/17   Larey Dresser, MD  Multiple Vitamins-Minerals ([employer] SPECTRAVITE ADULT 50+ PO) Take 1 tablet by mouth daily.    [provider]  ondansetron (ZOFRAN) 8 MG tablet Take 1 tablet (8 mg total) by mouth every 8 (eight) hours as needed for nausea or vomiting. 12/14/17   Lebron Conners, Marinell Blight, MD  pantoprazole (PROTONIX) 40 MG tablet Take 1 tablet (40 mg total) by mouth 2 (two) times daily before a meal. 08/27/17   Tat, Shanon Brow, MD  pantoprazole (PROTONIX) 40 MG tablet TAKE 1 TABLET BY MOUTH EVERY DAY 12/11/17   Rehman, Mechele Dawley, MD  potassium chloride SA (K-DUR,KLOR-CON) 20 MEQ tablet Take 2 tablets (40 mEq total) by mouth 2 (two) times daily. 10/31/17   Larey Dresser, MD  potassium chloride SA (KLOR-CON M20) 20 MEQ tablet Take 2 tablets (40 mEq total) by mouth 2 (two) times daily. 11/01/17   Larey Dresser, MD  PROAIR HFA 108 [phone] Base) MCG/ACT inhaler Take 2 puffs by mouth 2 (two) times daily. 03/28/17   [provider]  prochlorperazine (COMPAZINE) 10 MG tablet Take 1 tablet (10 mg total) by mouth every 6 (six) hours as needed for nausea or vomiting. 12/14/17   Ardath Sax, MD  sertraline (ZOLOFT) 50 MG tablet Take 1 tablet (50 mg total) by mouth daily. 09/01/17    Larey Dresser, MD  spironolactone (ALDACTONE) 25 MG tablet Take 1 tablet (25 mg total) by mouth daily. 10/18/17   Larey Dresser, MD  tamsulosin (FLOMAX) 0.4 MG CAPS capsule TAKE ONE CAPSULE BY MOUTH EVERY DAY AFTER SUPPER 07/11/17   Larey Dresser, MD  torsemide (DEMADEX) 20 MG tablet TAKE 6 TABLETS (120 MG TOTAL) BY MOUTH 2 (  TWO) TIMES DAILY. 11/14/17   Larey Dresser, MD  vitamin C (ASCORBIC ACID) 500 MG tablet Take 500 mg by mouth 2 (two) times daily.    [provider]    Family History Family History  Problem Relation Age of Onset  . Hypertension Father   . Diabetes Father   . Heart disease Father   . COPD Sister   . Cancer Maternal Aunt        Breast cancer   . Cancer Maternal Aunt        Breast cancer   . Diabetes Son   . Cancer Daughter        Cervical cancer  . Coronary artery disease Unknown        FAMILY HISTORY    Social History Social History   Tobacco Use  . Smoking status: Former Smoker    Packs/day: 1.00    Years: 30.00    Pack years: 30.00    Types: Cigarettes    Last attempt to quit: 07/25/2000    Years since quitting: 17.4  . Smokeless tobacco: Never Used  . Tobacco comment: Quit smoking in 2002  Substance Use Topics  . Alcohol use: No    Alcohol/week: 0.6 oz    Types: 1 Shots of liquor per week    Comment: stopped drinking alcohol  . Drug use: No     Allergies   Codeine; Diltiazem hcl; Niacin; Aspirin; and Diltiazem hcl   Review of Systems Review of Systems   Physical Exam Updated Vital Signs BP 103/62   Pulse 75   Temp 97.9 F (36.6 C) (Oral)   Resp 20   Ht 5' 8"  (1.727 m)   Wt 122 kg (269 lb)   SpO2 97%   BMI 40.90 kg/m   Physical Exam   ED Treatments / Results  Labs (all labs ordered are listed, but only abnormal results are displayed) Labs Reviewed  BASIC METABOLIC PANEL - Abnormal; Notable for the following components:      Result Value   Sodium 127 (*)    Chloride 89 (*)    Glucose, Bld 127 (*)      BUN 92 (*)    Creatinine, Ser 3.38 (*)    Calcium 8.5 (*)    GFR calc non Af Amer 17 (*)    GFR calc Af Amer 20 (*)    All other components within normal limits  CBC - Abnormal; Notable for the following components:   RBC 3.20 (*)    Hemoglobin 8.2 (*)    HCT 25.6 (*)    MCH 25.6 (*)    RDW 23.1 (*)    All other components within normal limits  I-STAT TROPONIN, ED  TYPE AND SCREEN    EKG None  Radiology Dg Chest 2 View  Result Date: 12/21/2017 CLINICAL DATA:  Shortness of breath. EXAM: CHEST - 2 VIEW COMPARISON:  Radiograph of September 26, 2017. FINDINGS: Stable cardiomegaly with central pulmonary vascular congestion. Status post coronary artery bypass graft. Single lead left-sided pacemaker is unchanged in position. Status post aortic valve replacement. No pneumothorax or pleural effusion is noted. No consolidative process is noted. Bony thorax is unremarkable. IMPRESSION: Stable cardiomegaly with central pulmonary vascular congestion. Electronically Signed   By: Marijo Conception, M.D.   On: 12/21/2017 13:21    Procedures Procedures (including critical care time)  Medications Ordered in ED Medications - No data to display   Initial Impression / Assessment and Plan / ED Course  I have reviewed the triage vital signs and the nursing notes.  Pertinent labs & imaging results that were available during my care of the patient were reviewed by me and considered in my medical decision making (see chart for details).    Sees Dr. Joelyn Oms at [facility]  Final Clinical Impressions(s) / ED Diagnoses   Final diagnoses:  None    ED Discharge Orders    None       Charlesetta Shanks, MD 12/21/17 [phone]

## 2017-12-21 NOTE — ED Triage Notes (Signed)
Pt reports being seen at [facility] on Tuesday and was given 2 units of blood r/t hgb of 6.4. Daughter reports that they did not recheck after but he has had increasing fatigue and felt worse since receiving transfusion. Is not a cancer pt states he sees the hematologist at the cancer center.

## 2017-12-21 NOTE — ED Provider Notes (Addendum)
[facility] EMERGENCY DEPARTMENT Provider Note   CSN: [id number] Arrival date & time: 12/21/17  1148     History   Chief Complaint Chief Complaint  Patient presents with  . Abnormal Lab  . Weakness    HPI [name] is a 72 y.o. male.  HPI Reports he has been getting increasingly short of breath and weak.  He reports he can hardly get out of bed to do anything.  Even minor activity is very difficult.  He reports his abdomen does seem to have gotten slightly more distended than normal.  He denies any associated abdominal pain.  Feet are slightly more swollen.  Loss of appetite no vomiting.  Patient reports he periodically gets paracentesis for abdominal swelling.  He denies that he has had fever or abdominal pain.  He does see the nephrologist Dr. Joelyn Oms at [facility]. Past Medical History:  Diagnosis Date  . Aortic stenosis 03/08/2012   a.  s/p tissue AVR 10/13 with Dr. [name];   b. Echo 10/13: mod LVH, EF 55-60%, tissue AVR not well seen, no leak, gradient not too high (mean 55mHg), MAC, mild MR, mild LAE, PASP 38  . Ascites    status post paracentesis with removal of 3.4 L of ascitic fluid  . Atrial fibrillation ([facility]    Permanent; off of Coumadin for now due to GI bleed  . AVM (arteriovenous malformation)    Recurrent GI bleeding requiring multiple transfusions  . CAD (coronary artery disease)    a. s/p CABG 2004;  [place]. [place]:  LHC 8/13: Free radial to obtuse marginal patent, SVG-diagonal patent, LIMA-LAD patent, EF 65-70%, mean aortic valve gradient 42  . Carotid bruit 06/14/2011   a. pre-AVR dopplers 10/13: no sig ICA stenosis  . Chronic diastolic heart failure ([place]   . Chronic kidney disease   . Cirrhosis ([place]   . COPD (chronic obstructive pulmonary disease) (HE[place]   . Depression   . DM2 (diabetes mellitus, type 2) (HCC)    at least 10 yrs  . Dyspnea   . GERD (gastroesophageal reflux disease)   . Gout   . H/O hiatal hernia   .  Headache   . Hyperlipidemia   . Hyperlipidemia   . Hypertension    x 15 yrs  . Insomnia   . Iron deficiency anemia    Requiring intravenous iron  . Mediastinal adenopathy 09/22/2011  . OSA (obstructive sleep apnea) 1999    USES CPAP  . Osteoarthritis   . Overweight(278.02)   . Presence of permanent cardiac pacemaker   . Thrombocytopenia ([facility]     Patient Active Problem List   Diagnosis Date Noted  . Symptomatic anemia 09/07/2017  . COPD (chronic obstructive pulmonary disease) ([place] 09/07/2017  . Gout 09/07/2017  . Palliative care encounter   . Goals of care, counseling/discussion   . Encounter for hospice care discussion   . Elevated troponin   . Acute blood loss anemia 08/24/2017  . Upper GI bleed 08/24/2017  . Cirrhosis of liver with ascites ([place] 08/24/2017  . Chronic diastolic CHF (congestive heart failure) ([place] 08/24/2017  . Acute renal failure superimposed on stage 4 chronic kidney disease ([place] 08/24/2017  . Hyponatremia 08/24/2017  . Gastrointestinal hemorrhage   . Anemia due to chronic renal failure treated with erythropoietin, stage 3 (moderate) ([place] 04/25/2017  . Blood in stool 10/20/2016  . Polydipsia 11/11/2015  . Acute on chronic diastolic CHF (congestive heart failure) (HH[place] 03/19/2015  . OSA (obstructive  sleep apnea) 09/23/2014  . S/P AV nodal ablation 04/18/14 04/19/2014  . Aortic stenosis, severe with S/P AVR  04/19/2014  . Atrioventricular block, complete ([place]) 04/18/2014  . CKD (chronic kidney disease), stage IV ([place]) 11/05/2013  . Unspecified gastritis and gastroduodenitis without mention of hemorrhage 07/31/2013  . Cough 06/28/2012  . Dyspnea 06/27/2012  . Permanent atrial fibrillation ([place]) 03/02/2012  . History of GI bleed 03/02/2012  . GERD (gastroesophageal reflux disease) 03/02/2012  . Type 2 diabetes mellitus ([place]) 03/02/2012  . Cirrhosis ([place])   . Mediastinal adenopathy 09/22/2011  . Osteoarthritis   . Thrombocytopenia ([place])   .  Gynecomastia 09/20/2011  . Ascites 09/20/2011  . Iron deficiency anemia   . Hyperlipidemia   . Essential hypertension   . CHF (congestive heart failure) ([place])   . 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 Carotid bruit 06/14/2011  . ANEMIA 05/17/2010  . Overweight 05/02/2009  . CAD, ARTERY BYPASS GRAFT 05/02/2009  . 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    Past Surgical History:  Procedure Laterality Date  . ABLATION  04-18-14   AVN ablation by Dr Caryl Comes  . AORTIC VALVE REPLACEMENT  04/25/2012   Procedure: AORTIC VALVE REPLACEMENT (AVR);  Surgeon: Melrose Nakayama, MD;  Location: [facility];  Service: Open Heart Surgery;  Laterality: N/A;  . AV NODE ABLATION N/A 04/18/2014   Procedure: AV NODE ABLATION;  Surgeon: Deboraha Sprang, MD;  Location: [facility] CATH LAB;  Service: Cardiovascular;  Laterality: N/A;  . CARPAL TUNNEL RELEASE    10/08/2003  . CATARACT EXTRACTION W/ INTRAOCULAR LENS IMPLANT Bilateral 09/28/2015 , 10/19/2015  . COLONOSCOPY WITH PROPOFOL N/A 10/28/2016   Procedure: COLONOSCOPY WITH PROPOFOL;  Surgeon: Rogene Houston, MD;  [facility];  Service: Endoscopy;  Laterality: N/A;  . CORONARY ANGIOPLASTY WITH STENT PLACEMENT  01/19/2005   drug eluting stent to high grade ostial stenosis of radial artery graft to OM  . CORONARY ARTERY BYPASS GRAFT   10/15/2002    [name]. [name], M.D.     . ESOPHAGOGASTRODUODENOSCOPY N/A 07/31/2013   Procedure: ESOPHAGOGASTRODUODENOSCOPY (EGD);  Surgeon: Milus Banister, MD;  Location: [facility];  Service: Endoscopy;  Laterality: N/A;  . ESOPHAGOGASTRODUODENOSCOPY Left 01/11/2014   Procedure: ESOPHAGOGASTRODUODENOSCOPY (EGD);  Surgeon: Juanita Craver, MD;  Location[facility] ENDOSCOPY;  Service: Endoscopy;  Laterality: Left;  . ESOPHAGOGASTRODUODENOSCOPY (EGD) WITH PROPOFOL N/A 10/28/2016   Procedure: ESOPHAGOGASTRODUODENOSCOPY (EGD) WITH PROPOFOL;  Surgeon: Rogene Houston, MD;  [facility];  Service: Endoscopy;  Laterality: N/A;  .  ESOPHAGOGASTRODUODENOSCOPY (EGD) WITH PROPOFOL N/A 08/25/2017   Procedure: ESOPHAGOGASTRODUODENOSCOPY (EGD) WITH PROPOFOL;  Surgeon: Rogene Houston, MD;  [facility];  Service: Endoscopy;  Laterality: N/A;  . GIVENS CAPSULE STUDY N/A 08/26/2017   Procedure: GIVENS CAPSULE STUDY;  Surgeon: Danie Binder, MD;  [facility];  Service: Endoscopy;  Laterality: N/A;  . HERNIA REPAIR    . IR PARACENTESIS  09/14/2017  . KNEE ARTHROSCOPY Right 01/20/2016   Procedure: ARTHROSCOPY RIGHT KNEE WITH MENSICAL DEBRIDEMENT;  Surgeon: Gaynelle Arabian, MD;  [facility];  Service: Orthopedics;  Laterality: Right;  . LEFT AND RIGHT HEART CATHETERIZATION WITH CORONARY/GRAFT ANGIOGRAM N/A 03/07/2012   Procedure: LEFT AND RIGHT HEART CATHETERIZATION WITH Beatrix Fetters;  Surgeon: Sherren Mocha, MD;  Location: [facility] C[facility] LAB;  Service: Cardiovascular;  Laterality: N/A;  . lipoma surgery    . OTHER SURGICAL HISTORY  08/26/2011   [facility],  enteroscopy , revealing "three-four AVMs."   .  PACEMAKER INSERTION  03-24-14   STJ Assurity single chamber pacemaker implanted by Dr Caryl Comes  . PARACENTESIS  12/2015  . PERMANENT PACEMAKER INSERTION N/A 03/24/2014   Procedure: PERMANENT PACEMAKER INSERTION;  Surgeon: Deboraha Sprang, MD;  Location: [facility] CATH LAB;  Service: Cardiovascular;  Laterality: N/A;  . POLYPECTOMY  10/28/2016   Procedure: POLYPECTOMY;  Surgeon: Rogene Houston, MD;  [facility];  Service: Endoscopy;;  gastric  . RIGHT HEART CATHETERIZATION N/A 01/01/2014   Procedure: RIGHT HEART CATH;  Surgeon: Jolaine Artist, MD;  Location[facility] C[facility] LAB;  Service: Cardiovascular;  Laterality: N/A;  . TEE WITHOUT CARDIOVERSION  03/07/2012   Procedure: TRANSESOPHAGEAL ECHOCARDIOGRAM (TEE);  Surgeon: Thayer Headings, MD;  Location: [facility];  Service: Cardiovascular;  Laterality: N/A;        Home Medications    Prior to Admission medications   Medication Sig Start Date End Date Taking?  Authorizing Provider  acetaminophen (TYLENOL) 500 MG tablet Take 1,000 mg by mouth at bedtime as needed for mild pain or headache.     [provider]  allopurinol (ZYLOPRIM) 100 MG tablet Take 1 tablet (100 mg total) by mouth daily. 04/18/17   Larey Dresser, MD  atorvastatin (LIPITOR) 20 MG tablet Take 20 mg by mouth daily at 6 PM.     [provider]  Chlorpheniramine Maleate (ALLERGY PO) Take 1 tablet by mouth daily.    [provider]  gabapentin (NEURONTIN) 300 MG capsule Take 300 mg by mouth 3 (three) times daily. 12/31/14   [provider]  insulin NPH Human (HUMULIN N,NOVOLIN N) 100 UNIT/ML injection Inject 65 Units into the skin 2 (two) times daily before a meal.     [provider]  insulin regular (NOVOLIN R,HUMULIN R) 100 units/mL injection Inject 30 Units into the skin 2 (two) times daily before a meal.     [provider]  metolazone (ZAROXOLYN) 5 MG tablet Take 1 tablet (5 mg total) by mouth every other day. 10/05/17 01/03/18  Larey Dresser, MD  metolazone (ZAROXOLYN) 5 MG tablet TAKE 1 TABLET (5 MG TOTAL) BY MOUTH EVERY OTHER DAY. 12/06/17   Larey Dresser, MD  metoprolol succinate (TOPROL-XL) 25 MG 24 hr tablet Take 1 tablet (25 mg total) by mouth 2 (two) times daily. 11/01/17   Larey Dresser, MD  Multiple Vitamins-Minerals ([employer] SPECTRAVITE ADULT 50+ PO) Take 1 tablet by mouth daily.    [provider]  ondansetron (ZOFRAN) 8 MG tablet Take 1 tablet (8 mg total) by mouth every 8 (eight) hours as needed for nausea or vomiting. 12/14/17   Lebron Conners, Marinell Blight, MD  pantoprazole (PROTONIX) 40 MG tablet Take 1 tablet (40 mg total) by mouth 2 (two) times daily before a meal. 08/27/17   Tat, Shanon Brow, MD  pantoprazole (PROTONIX) 40 MG tablet TAKE 1 TABLET BY MOUTH EVERY DAY 12/11/17   Rehman, Mechele Dawley, MD  potassium chloride SA (K-DUR,KLOR-CON) 20 MEQ tablet Take 2 tablets (40 mEq total) by mouth 2 (two) times daily. 10/31/17   Larey Dresser, MD  potassium chloride SA (KLOR-CON M20) 20 MEQ tablet Take 2 tablets (40 mEq total) by mouth 2 (two) times daily. 11/01/17   Larey Dresser, MD  PROAIR HFA 108 [phone] Base) MCG/ACT inhaler Take 2 puffs by mouth 2 (two) times daily. 03/28/17   [provider]  prochlorperazine (COMPAZINE) 10 MG tablet Take 1 tablet (10 mg total) by mouth every 6 (six)  hours as needed for nausea or vomiting. 12/14/17   Ardath Sax, MD  sertraline (ZOLOFT) 50 MG tablet Take 1 tablet (50 mg total) by mouth daily. 09/01/17   Larey Dresser, MD  spironolactone (ALDACTONE) 25 MG tablet Take 1 tablet (25 mg total) by mouth daily. 10/18/17   Larey Dresser, MD  tamsulosin (FLOMAX) 0.4 MG CAPS capsule TAKE ONE CAPSULE BY MOUTH EVERY DAY AFTER SUPPER 07/11/17   Larey Dresser, MD  torsemide (DEMADEX) 20 MG tablet TAKE 6 TABLETS (120 MG TOTAL) BY MOUTH 2 (TWO) TIMES DAILY. 11/14/17   Larey Dresser, MD  vitamin C (ASCORBIC ACID) 500 MG tablet Take 500 mg by mouth 2 (two) times daily.    [provider]    Family History Family History  Problem Relation Age of Onset  . Hypertension Father   . Diabetes Father   . Heart disease Father   . COPD Sister   . Cancer Maternal Aunt        Breast cancer   . Cancer Maternal Aunt        Breast cancer   . Diabetes Son   . Cancer Daughter        Cervical cancer  . Coronary artery disease Unknown        FAMILY HISTORY    Social History Social History   Tobacco Use  . Smoking status: Former Smoker    Packs/day: 1.00    Years: 30.00    Pack years: 30.00    Types: Cigarettes    Last attempt to quit: 07/25/2000    Years since quitting: 17.4  . Smokeless tobacco: Never Used  . Tobacco comment: Quit smoking in 2002  Substance Use Topics  . Alcohol use: No    Alcohol/week: 0.6 oz    Types: 1 Shots of liquor per week    Comment: stopped drinking alcohol  . Drug use: No     Allergies   Codeine; Diltiazem hcl; Niacin; Aspirin; and  Diltiazem hcl   Review of Systems Review of Systems 10 Systems reviewed and are negative for acute change except as noted in the HPI.  Physical Exam Updated Vital Signs BP 103/62   Pulse 75   Temp 97.9 F (36.6 C) (Oral)   Resp 20   Ht 5' 8"  (1.727 m)   Wt 122 kg (269 lb)   SpO2 97%   BMI 40.90 kg/m   Physical Exam  Constitutional:  Patient is deconditioned with central obesity and ascites.  Pale in appearance.  HENT:  Head: Normocephalic and atraumatic.  Mouth/Throat: Oropharynx is clear and moist.  Eyes: EOM are normal.  Cardiovascular: Normal rate, regular rhythm, normal heart sounds and intact distal pulses.  Pulmonary/Chest: Effort normal.  Fine basilar rales.  Abdominal: Soft.  Patient has abdominal distention consistent with ascites.  This is soft and nontender to palpation.  Musculoskeletal:  1+ pitting edema bilateral lower extremities.  Calves nontender.  Neurological:  Patient is alert and oriented.  He has nonfocal neurologic exam.  He is generally deconditioned and weak but does not have focal neurologic deficit.  Skin: Skin is warm and dry. There is pallor.  Psychiatric: He has a normal mood and affect.     ED Treatments / Results  Labs (all labs ordered are listed, but only abnormal results are displayed) Labs Reviewed  BASIC METABOLIC PANEL - Abnormal; Notable for the following components:      Result Value   Sodium 127 (*)  Chloride 89 (*)    Glucose, Bld 127 (*)    BUN 92 (*)    Creatinine, Ser 3.38 (*)    Calcium 8.5 (*)    GFR calc non Af Amer 17 (*)    GFR calc Af Amer 20 (*)    All other components within normal limits  CBC - Abnormal; Notable for the following components:   RBC 3.20 (*)    Hemoglobin 8.2 (*)    HCT 25.6 (*)    MCH 25.6 (*)    RDW 23.1 (*)    All other components within normal limits  I-STAT TROPONIN, ED  TYPE AND SCREEN    EKG None  Radiology Dg Chest 2 View  Result Date: 12/21/2017 CLINICAL DATA:   Shortness of breath. EXAM: CHEST - 2 VIEW COMPARISON:  Radiograph of September 26, 2017. FINDINGS: Stable cardiomegaly with central pulmonary vascular congestion. Status post coronary artery bypass graft. Single lead left-sided pacemaker is unchanged in position. Status post aortic valve replacement. No pneumothorax or pleural effusion is noted. No consolidative process is noted. Bony thorax is unremarkable. IMPRESSION: Stable cardiomegaly with central pulmonary vascular congestion. Electronically Signed   By: Marijo Conception, M.D.   On: 12/21/2017 13:21    Procedures Procedures (including critical care time)  Medications Ordered in ED Medications - No data to display   Initial Impression / Assessment and Plan / ED Course  I have reviewed the triage vital signs and the nursing notes.  Pertinent labs & imaging results that were available during my care of the patient were reviewed by me and considered in my medical decision making (see chart for details).     Consult: (15: 11) Dr. Lorin Mercy for admission.  Suggest initiating Lasix diuresis with anticipated short course of diuresis for volume overload.  Final Clinical Impressions(s) / ED Diagnoses   Final diagnoses:  Symptomatic anemia  Acute renal failure superimposed on chronic kidney disease, unspecified CKD stage, unspecified acute renal failure type ([place])  Acute on chronic congestive heart failure, unspecified heart failure type [facility])   Patient presents with severe comorbid medical illnesses.  He has chronic renal insufficiency but this is worse today, GFR has decreased.  Patient also has suggestion of mild CHF exacerbation and with crackles on exam and some vascular congestion on chest x-ray.  Patient is chronically anemic.  He was recently transfused.  This may be contributing to some volume overload.  Plan for admission for multifactorial etiology of severe general weakness and shortness of breath. ED Discharge Orders    None         Charlesetta Shanks, MD 12/21/17 1504    Charlesetta Shanks, MD 12/21/17 [id number]

## 2017-12-21 NOTE — ED Notes (Signed)
Pt in XR. 

## 2017-12-21 NOTE — H&P (Signed)
History and Physical    [name] OYD:[id number] DOB: 01-12-1946 DOA: 12/21/2017  PCP: [name], MD Consultants:  Laural Golden - GILebron Conners - hematology; [facility] - cardiology; Joelyn Oms - nephrology; Debbora Presto - endocrinology Patient coming from: Home - lives with wife and son; : wife, [phone]  Chief Complaint: weakness, fatigue  HPI: [name] is a 72 y.o. male with medical history significant of pacemaker; thrombocytopenia; OSA on CPAP; HTN; HLD; DM; COPD; CKD and associated anemia; cirrhosis; chronic diastolic heart failure (grade 2 diastolic dysfunction); CAD; and afib off AC due to h/o GI bleeds presenting with "I just don't have any energy".  It has been slowly progressing for about a month.  It finally got to the point this AM that he couldn't even get off the commode by himself.  He usually feels much better after blood transfusion, but he received 2 units Tuesday and it didn't make any improvement. He is chronically SOB, possibly just slightly worse.  No cough.  Mild swelling in feet/ankles.  He is just extremely weak, "just sick", can't walk or get up and down.  ED Course:   Multiple medical problems.  Renal insufficiency and hepatic failure.  Weak, unable to get out bed, SOB.  ?volume overload, worsening renal function.  Review of Systems: As per HPI; otherwise review of systems reviewed and negative.   Ambulatory Status:  Ambulates with a walker normally, but currently needing a wheelchair  Past Medical History:  Diagnosis Date  . Aortic stenosis 03/08/2012   a.  s/p tissue AVR 10/13 with Dr. [name];   b. Echo 10/13: mod LVH, EF 55-60%, tissue AVR not well seen, no leak, gradient not too high (mean 31mHg), MAC, mild MR, mild LAE, PASP 38  . Ascites    status post paracentesis with removal of 3.4 L of ascitic fluid  . Atrial fibrillation ([place]    Permanent; off of Coumadin for now due to GI bleed  . AVM (arteriovenous malformation)    Recurrent GI bleeding  requiring multiple transfusions  . CAD (coronary artery disease)    a. s/p CABG 2004;  b. [place]:  LHC 8/13: Free radial to obtuse marginal patent, SVG-diagonal patent, LIMA-LAD patent, EF 65-70%, mean aortic valve gradient 42  . Carotid bruit 06/14/2011   a. pre-AVR dopplers 10/13: no sig ICA stenosis  . Chronic diastolic heart failure ([place]   . Chronic kidney disease   . Cirrhosis ([place]   . COPD (chronic obstructive pulmonary disease) ([place]   . Depression   . DM2 (diabetes mellitus, type 2) (HCC)    at least 10 yrs  . Dyspnea   . GERD (gastroesophageal reflux disease)   . Gout   . H/O hiatal hernia   . Headache   . Hyperlipidemia   . Hypertension    x 15 yrs  . Insomnia   . Iron deficiency anemia    Requiring intravenous iron  . Mediastinal adenopathy 09/22/2011  . OSA (obstructive sleep apnea) 1999   does not wear CPAP regularly  . Osteoarthritis   . Overweight(278.02)   . Presence of permanent cardiac pacemaker   . Thrombocytopenia ([place]     Past Surgical History:  Procedure Laterality Date  . ABLATION  04-18-14   AVN ablation by Dr KCaryl Comes . AORTIC VALVE REPLACEMENT  04/25/2012   Procedure: AORTIC VALVE REPLACEMENT (AVR);  Surgeon: SMelrose Nakayama MD;  [place]  Service: Open Heart Surgery;  Laterality: N/A;  . AV NODE ABLATION  N/A 04/18/2014   Procedure: AV NODE ABLATION;  Surgeon: Deboraha Sprang, MD;  Location: [facility] CATH LAB;  Service: Cardiovascular;  Laterality: N/A;  . CARPAL TUNNEL RELEASE    10/08/2003  . CATARACT EXTRACTION W/ INTRAOCULAR LENS IMPLANT Bilateral 09/28/2015 , 10/19/2015  . COLONOSCOPY WITH PROPOFOL N/A 10/28/2016   Procedure: COLONOSCOPY WITH PROPOFOL;  Surgeon: Rogene Houston, MD;  [facility];  Service: Endoscopy;  Laterality: N/A;  . CORONARY ANGIOPLASTY WITH STENT PLACEMENT  01/19/2005   drug eluting stent to high grade ostial stenosis of radial artery graft to OM  . CORONARY ARTERY BYPASS GRAFT   10/15/2002    [name]. [name], M.D.     . ESOPHAGOGASTRODUODENOSCOPY N/A 07/31/2013   Procedure: ESOPHAGOGASTRODUODENOSCOPY (EGD);  Surgeon: Milus Banister, MD;  Location: [place];  Service: Endoscopy;  Laterality: N/A;  . ESOPHAGOGASTRODUODENOSCOPY Left 01/11/2014   Procedure: ESOPHAGOGASTRODUODENOSCOPY (EGD);  Surgeon: Juanita Craver, MD;  Location: [facility] ENDOSCOPY;  Service: Endoscopy;  Laterality: Left;  . ESOPHAGOGASTRODUODENOSCOPY (EGD) WITH PROPOFOL N/A 10/28/2016   Procedure: ESOPHAGOGASTRODUODENOSCOPY (EGD) WITH PROPOFOL;  Surgeon: Rogene Houston, MD;  [facility];  Service: Endoscopy;  Laterality: N/A;  . ESOPHAGOGASTRODUODENOSCOPY (EGD) WITH PROPOFOL N/A 08/25/2017   Procedure: ESOPHAGOGASTRODUODENOSCOPY (EGD) WITH PROPOFOL;  Surgeon: Rogene Houston, MD;  [facility];  Service: Endoscopy;  Laterality: N/A;  . GIVENS CAPSULE STUDY N/A 08/26/2017   Procedure: GIVENS CAPSULE STUDY;  Surgeon: Danie Binder, MD;  [facility];  Service: Endoscopy;  Laterality: N/A;  . HERNIA REPAIR    . IR PARACENTESIS  09/14/2017  . KNEE ARTHROSCOPY Right 01/20/2016   Procedure: ARTHROSCOPY RIGHT KNEE WITH MENSICAL DEBRIDEMENT;  Surgeon: Gaynelle Arabian, MD;  Location: [facility];  Service: Orthopedics;  Laterality: Right;  . LEFT AND RIGHT HEART CATHETERIZATION WITH CORONARY/GRAFT ANGIOGRAM N/A 03/07/2012   Procedure: LEFT AND RIGHT HEART CATHETERIZATION WITH Beatrix Fetters;  Surgeon: Sherren Mocha, MD;  Location: [facility] CATH LAB;  Service: Cardiovascular;  Laterality: N/A;  . lipoma surgery    . OTHER SURGICAL HISTORY  08/26/2011   [facility],  enteroscopy , revealing "three-four AVMs."   . PACEMAKER INSERTION  03-24-14   STJ Assurity single chamber pacemaker implanted by Dr Caryl Comes  . PARACENTESIS  12/2015  . PERMANENT PACEMAKER INSERTION N/A 03/24/2014   Procedure: PERMANENT PACEMAKER INSERTION;  Surgeon: Deboraha Sprang, MD;  Location: [facility] CATH LAB;  Service: Cardiovascular;  Laterality: N/A;    . POLYPECTOMY  10/28/2016   Procedure: POLYPECTOMY;  Surgeon: Rogene Houston, MD;  [facility];  Service: Endoscopy;;  gastric  . RIGHT HEART CATHETERIZATION N/A 01/01/2014   Procedure: RIGHT HEART CATH;  Surgeon: Jolaine Artist, MD;  Location: [facility] CATH LAB;  Service: Cardiovascular;  Laterality: N/A;  . TEE WITHOUT CARDIOVERSION  03/07/2012   Procedure: TRANSESOPHAGEAL ECHOCARDIOGRAM (TEE);  Surgeon: Thayer Headings, MD;  Location: [facility] ENDOSCOPY;  Service: Cardiovascular;  Laterality: N/A;    Social History   Socioeconomic History  . Marital status: Married    Spouse name: Not on file  . Number of children: 3  . Years of education: Not on file  . Highest education level: Not on file  Occupational History  . Occupation: retired    Comment: Associate Professor at [employer]  . Financial resource strain: Not on file  . Food insecurity:    Worry: Not on file    Inability: Not on file  . Transportation needs:  Medical: Not on file    Non-medical: Not on file  Tobacco Use  . Smoking status: Former Smoker    Packs/day: 1.00    Years: 30.00    Pack years: 30.00    Types: Cigarettes    Last attempt to quit: 07/25/2000    Years since quitting: 17.4  . Smokeless tobacco: Never Used  . Tobacco comment: Quit smoking in 2002  Substance and Sexual Activity  . Alcohol use: No    Alcohol/week: 0.6 oz    Types: 1 Shots of liquor per week    Comment: stopped drinking alcohol  . Drug use: No  . Sexual activity: Yes  Lifestyle  . Physical activity:    Days per week: Not on file    Minutes per session: Not on file  . Stress: Not on file  Relationships  . Social connections:    Talks on phone: Not on file    Gets together: Not on file    Attends religious service: Not on file    Active member of club or organization: Not on file    Attends meetings of clubs or organizations: Not on file    Relationship status: Not on file  . Intimate partner violence:    Fear  of current or ex partner: Not on file    Emotionally abused: Not on file    Physically abused: Not on file    Forced sexual activity: Not on file  Other Topics Concern  . Not on file  Social History Narrative   Lives in [place], [place] with wife.     Allergies  Allergen Reactions  . Codeine Other (See Comments) and Palpitations    Hurting in chest  . Niacin Other (See Comments)    Felt a "severe burning sensation" Hot flashes  . Aspirin Other (See Comments)    History of Bleeding ulcers   . Diltiazem Hcl Other (See Comments)    Gets hot, hives, itching    Family History  Problem Relation Age of Onset  . Hypertension Father   . Diabetes Father   . Heart disease Father   . COPD Sister   . Cancer Maternal Aunt        Breast cancer   . Cancer Maternal Aunt        Breast cancer   . Diabetes Son   . Cancer Daughter        Cervical cancer  . Coronary artery disease Unknown        FAMILY HISTORY    Prior to Admission medications   Medication Sig Start Date End Date Taking? Authorizing Provider  acetaminophen (TYLENOL) 500 MG tablet Take 1,000 mg by mouth at bedtime as needed for mild pain or headache.     [provider]  allopurinol (ZYLOPRIM) 100 MG tablet Take 1 tablet (100 mg total) by mouth daily. 04/18/17   Larey Dresser, MD  atorvastatin (LIPITOR) 20 MG tablet Take 20 mg by mouth daily at 6 PM.     [provider]  Chlorpheniramine Maleate (ALLERGY PO) Take 1 tablet by mouth daily.    [provider]  gabapentin (NEURONTIN) 300 MG capsule Take 300 mg by mouth 3 (three) times daily. 12/31/14   [provider]  insulin NPH Human (HUMULIN N,NOVOLIN N) 100 UNIT/ML injection Inject 65 Units into the skin 2 (two) times daily before a meal.     [provider]  insulin regular (NOVOLIN R,HUMULIN R) 100 units/mL injection Inject 30  Units into the skin 2 (two) times daily before a meal.     [provider]  metolazone  (ZAROXOLYN) 5 MG tablet Take 1 tablet (5 mg total) by mouth every other day. 10/05/17 01/03/18  Larey Dresser, MD  metolazone (ZAROXOLYN) 5 MG tablet TAKE 1 TABLET (5 MG TOTAL) BY MOUTH EVERY OTHER DAY. 12/06/17   Larey Dresser, MD  metoprolol succinate (TOPROL-XL) 25 MG 24 hr tablet Take 1 tablet (25 mg total) by mouth 2 (two) times daily. 11/01/17   Larey Dresser, MD  Multiple Vitamins-Minerals ([employer] SPECTRAVITE ADULT 50+ PO) Take 1 tablet by mouth daily.    [provider]  ondansetron (ZOFRAN) 8 MG tablet Take 1 tablet (8 mg total) by mouth every 8 (eight) hours as needed for nausea or vomiting. 12/14/17   Lebron Conners, Marinell Blight, MD  pantoprazole (PROTONIX) 40 MG tablet Take 1 tablet (40 mg total) by mouth 2 (two) times daily before a meal. 08/27/17   Tat, Shanon Brow, MD  pantoprazole (PROTONIX) 40 MG tablet TAKE 1 TABLET BY MOUTH EVERY DAY 12/11/17   Rehman, Mechele Dawley, MD  potassium chloride SA (K-DUR,KLOR-CON) 20 MEQ tablet Take 2 tablets (40 mEq total) by mouth 2 (two) times daily. 10/31/17   Larey Dresser, MD  potassium chloride SA (KLOR-CON M20) 20 MEQ tablet Take 2 tablets (40 mEq total) by mouth 2 (two) times daily. 11/01/17   Larey Dresser, MD  PROAIR HFA 108 [phone] Base) MCG/ACT inhaler Take 2 puffs by mouth 2 (two) times daily. 03/28/17   [provider]  prochlorperazine (COMPAZINE) 10 MG tablet Take 1 tablet (10 mg total) by mouth every 6 (six) hours as needed for nausea or vomiting. 12/14/17   Ardath Sax, MD  sertraline (ZOLOFT) 50 MG tablet Take 1 tablet (50 mg total) by mouth daily. 09/01/17   Larey Dresser, MD  spironolactone (ALDACTONE) 25 MG tablet Take 1 tablet (25 mg total) by mouth daily. 10/18/17   Larey Dresser, MD  tamsulosin (FLOMAX) 0.4 MG CAPS capsule TAKE ONE CAPSULE BY MOUTH EVERY DAY AFTER SUPPER 07/11/17   Larey Dresser, MD  torsemide (DEMADEX) 20 MG tablet TAKE 6 TABLETS (120 MG TOTAL) BY MOUTH 2 (TWO) TIMES DAILY. 11/14/17   Larey Dresser, MD    vitamin C (ASCORBIC ACID) 500 MG tablet Take 500 mg by mouth 2 (two) times daily.    [provider]    Physical Exam: Vitals:   12/21/17 1330 12/21/17 1345 12/21/17 1400 12/21/17 1539  BP: 98/63 98/63 103/62 100/64  Pulse: 75 75 75 75  Resp: 12 19 20 19   Temp:      TempSrc:      SpO2: 97% 96% 97% 98%  Weight:      Height:         General:  Appears chronically ill, somewhat weak and fatigued Eyes:  PERRL, EOMI, normal lids, iris ENT:  grossly normal hearing, lips & tongue, mmm Neck:  no LAD, masses or thyromegaly; no carotid bruits Cardiovascular:  RRR, no r/g, 1-4/4 systolic murmur. 2+ LE edema.  Respiratory:   Bibasilar crackles extending 1/3 the way up.  Mildly increased respiratory effort. Abdomen: Obese, NT, no obvious fluid wave Back:   normal alignment, no CVAT Skin:  no rash or induration seen on limited exam Musculoskeletal:  grossly normal tone BUE/BLE, good ROM, no bony abnormality Psychiatric: blunted mood and affect, speech fluent and appropriate, AOx3 Neurologic:  CN 2-12 grossly intact,  moves all extremities in coordinated fashion, sensation intact    Radiological Exams on Admission: Dg Chest 2 View  Result Date: 12/21/2017 CLINICAL DATA:  Shortness of breath. EXAM: CHEST - 2 VIEW COMPARISON:  Radiograph of September 26, 2017. FINDINGS: Stable cardiomegaly with central pulmonary vascular congestion. Status post coronary artery bypass graft. Single lead left-sided pacemaker is unchanged in position. Status post aortic valve replacement. No pneumothorax or pleural effusion is noted. No consolidative process is noted. Bony thorax is unremarkable. IMPRESSION: Stable cardiomegaly with central pulmonary vascular congestion. Electronically Signed   By: Marijo Conception, M.D.   On: 12/21/2017 13:21    EKG: Independently reviewed.  Ventricularly paced rhythm at rate 75   Labs on Admission: I have personally reviewed the available labs and imaging studies at the  time of the admission.  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, stage 3 (moderate) (HCC)   Chronic diastolic CHF (congestive heart failure) (HCC)   Acute renal failure superimposed on stage 4 chronic kidney disease (HCC)   Volume overload   Fatigue -Likely multifactorial, but this is his primary complaint - he is chronically exhausted and unable to participate in ADLs -Anemia has been part of the issue but currently appears to be stable -Untreated OSA may be contributing -Multiple chronic medical problems likely contributing -Will observe -Request PT evaluation -Request palliative care consult regarding goals of care as well as symptom management  Volume overload -Patient received blood earlier this week and appears to be mildly volume overloaded -Suspect that rather than CHF per se, it is simply related to additional fluid provided with blood -He does have underlying grade 2 DD so this may be contributing -Will diurese - but cautiously given renal failure -Monitor on tele for now   ARF on CKD -Suspect that this is related to decreased renal perfusion -Will diurese and recheck in AM  OSA -He is not using his CPAP and this may be a contributing factor -Will resume CPAP in the hospital to see if this improves his symptoms  Anemia -Stable/improved from baseline so this seems less likely the source of his current fatigue -Consider consult to Dr. Lebron Conners if this is considered more likely as the etiology -Additionally, with his chronic and recurrent anemia and thrombocytopenia, it may be reasonable to consider bone marrow  biopsy  HTN -Currently borderline low BP, which may also be contributing to his symptoms -Continue Toprol for now, but again this could be a contributor  DVT prophylaxis: Lovenox  Code Status: DNR - confirmed with patient/family Family Communication: Wife and daughter were present throughout evaluation Disposition Plan:  Home once clinically improved Consults called: CM, palliative care, nutrition, PT Admission status: It is my clinical opinion that referral for OBSERVATION is reasonable and necessary in this patient based on the above information provided. The aforementioned taken together are felt to place the patient at high risk for further clinical deterioration. However it is anticipated that the patient may be medically stable for discharge from the hospital within 24 to 48 hours.    Karmen Bongo MD [facility]  If note is complete, please contact covering daytime or nighttime physician.  [email or web address] Password [email or web address]  12/21/2017, 5:09 PM

## 2017-12-21 NOTE — ED Notes (Signed)
Patient states he normally feels better after getting blood transfusion, he has received blood  Every week for the past 3 weeks. States he just feels so weak. Family states patient isn't able to walk around the house , has been having to use wheelchair at home.

## 2017-12-22 ENCOUNTER — Observation Stay (HOSPITAL_COMMUNITY): Payer: Medicare Other

## 2017-12-22 ENCOUNTER — Other Ambulatory Visit: Payer: Self-pay

## 2017-12-22 ENCOUNTER — Encounter (HOSPITAL_COMMUNITY): Payer: Self-pay | Admitting: Student

## 2017-12-22 DIAGNOSIS — D631 Anemia in chronic kidney disease: Secondary | ICD-10-CM | POA: Diagnosis not present

## 2017-12-22 DIAGNOSIS — R188 Other ascites: Secondary | ICD-10-CM | POA: Diagnosis not present

## 2017-12-22 DIAGNOSIS — K7031 Alcoholic cirrhosis of liver with ascites: Secondary | ICD-10-CM | POA: Diagnosis not present

## 2017-12-22 DIAGNOSIS — D649 Anemia, unspecified: Secondary | ICD-10-CM

## 2017-12-22 DIAGNOSIS — Z886 Allergy status to analgesic agent status: Secondary | ICD-10-CM | POA: Diagnosis not present

## 2017-12-22 DIAGNOSIS — Q2730 Arteriovenous malformation, site unspecified: Secondary | ICD-10-CM | POA: Diagnosis not present

## 2017-12-22 DIAGNOSIS — E8770 Fluid overload, unspecified: Secondary | ICD-10-CM | POA: Diagnosis not present

## 2017-12-22 DIAGNOSIS — R1084 Generalized abdominal pain: Secondary | ICD-10-CM | POA: Diagnosis not present

## 2017-12-22 DIAGNOSIS — E785 Hyperlipidemia, unspecified: Secondary | ICD-10-CM | POA: Diagnosis present

## 2017-12-22 DIAGNOSIS — D500 Iron deficiency anemia secondary to blood loss (chronic): Secondary | ICD-10-CM | POA: Diagnosis not present

## 2017-12-22 DIAGNOSIS — I2510 Atherosclerotic heart disease of native coronary artery without angina pectoris: Secondary | ICD-10-CM | POA: Diagnosis present

## 2017-12-22 DIAGNOSIS — Z6841 Body Mass Index (BMI) 40.0 and over, adult: Secondary | ICD-10-CM | POA: Diagnosis not present

## 2017-12-22 DIAGNOSIS — Z9889 Other specified postprocedural states: Secondary | ICD-10-CM | POA: Diagnosis not present

## 2017-12-22 DIAGNOSIS — R627 Adult failure to thrive: Secondary | ICD-10-CM | POA: Diagnosis present

## 2017-12-22 DIAGNOSIS — Z7189 Other specified counseling: Secondary | ICD-10-CM | POA: Diagnosis not present

## 2017-12-22 DIAGNOSIS — E871 Hypo-osmolality and hyponatremia: Secondary | ICD-10-CM | POA: Diagnosis not present

## 2017-12-22 DIAGNOSIS — J449 Chronic obstructive pulmonary disease, unspecified: Secondary | ICD-10-CM | POA: Diagnosis present

## 2017-12-22 DIAGNOSIS — E1122 Type 2 diabetes mellitus with diabetic chronic kidney disease: Secondary | ICD-10-CM | POA: Diagnosis not present

## 2017-12-22 DIAGNOSIS — G4733 Obstructive sleep apnea (adult) (pediatric): Secondary | ICD-10-CM | POA: Diagnosis not present

## 2017-12-22 DIAGNOSIS — R5383 Other fatigue: Secondary | ICD-10-CM | POA: Diagnosis not present

## 2017-12-22 DIAGNOSIS — N184 Chronic kidney disease, stage 4 (severe): Secondary | ICD-10-CM | POA: Diagnosis not present

## 2017-12-22 DIAGNOSIS — R5382 Chronic fatigue, unspecified: Secondary | ICD-10-CM | POA: Diagnosis not present

## 2017-12-22 DIAGNOSIS — Z885 Allergy status to narcotic agent status: Secondary | ICD-10-CM | POA: Diagnosis not present

## 2017-12-22 DIAGNOSIS — I509 Heart failure, unspecified: Secondary | ICD-10-CM | POA: Diagnosis not present

## 2017-12-22 DIAGNOSIS — N179 Acute kidney failure, unspecified: Secondary | ICD-10-CM | POA: Diagnosis not present

## 2017-12-22 DIAGNOSIS — I5032 Chronic diastolic (congestive) heart failure: Secondary | ICD-10-CM | POA: Diagnosis not present

## 2017-12-22 DIAGNOSIS — Z952 Presence of prosthetic heart valve: Secondary | ICD-10-CM | POA: Diagnosis not present

## 2017-12-22 DIAGNOSIS — Z950 Presence of cardiac pacemaker: Secondary | ICD-10-CM | POA: Diagnosis not present

## 2017-12-22 DIAGNOSIS — Z794 Long term (current) use of insulin: Secondary | ICD-10-CM | POA: Diagnosis not present

## 2017-12-22 DIAGNOSIS — D696 Thrombocytopenia, unspecified: Secondary | ICD-10-CM | POA: Diagnosis present

## 2017-12-22 DIAGNOSIS — K7460 Unspecified cirrhosis of liver: Secondary | ICD-10-CM | POA: Diagnosis not present

## 2017-12-22 DIAGNOSIS — E1151 Type 2 diabetes mellitus with diabetic peripheral angiopathy without gangrene: Secondary | ICD-10-CM | POA: Diagnosis present

## 2017-12-22 DIAGNOSIS — N189 Chronic kidney disease, unspecified: Secondary | ICD-10-CM | POA: Diagnosis not present

## 2017-12-22 DIAGNOSIS — I130 Hypertensive heart and chronic kidney disease with heart failure and stage 1 through stage 4 chronic kidney disease, or unspecified chronic kidney disease: Secondary | ICD-10-CM | POA: Diagnosis not present

## 2017-12-22 DIAGNOSIS — Z515 Encounter for palliative care: Secondary | ICD-10-CM

## 2017-12-22 DIAGNOSIS — I10 Essential (primary) hypertension: Secondary | ICD-10-CM | POA: Diagnosis not present

## 2017-12-22 DIAGNOSIS — Z888 Allergy status to other drugs, medicaments and biological substances status: Secondary | ICD-10-CM | POA: Diagnosis not present

## 2017-12-22 HISTORY — PX: IR PARACENTESIS: IMG2679

## 2017-12-22 LAB — HIV ANTIBODY (ROUTINE TESTING W REFLEX): HIV SCREEN 4TH GENERATION: NONREACTIVE

## 2017-12-22 LAB — BASIC METABOLIC PANEL
ANION GAP: 14 (ref 5–15)
BUN: 89 mg/dL — ABNORMAL HIGH (ref 6–20)
CALCIUM: 8.7 mg/dL — AB (ref 8.9–10.3)
CO2: 20 mmol/L — AB (ref 22–32)
CREATININE: 3.45 mg/dL — AB (ref 0.61–1.24)
Chloride: 87 mmol/L — ABNORMAL LOW (ref 101–111)
GFR, EST AFRICAN AMERICAN: 19 mL/min — AB (ref >60)
GFR, EST NON AFRICAN AMERICAN: 17 mL/min — AB (ref >60)
GLUCOSE: 127 mg/dL — AB (ref 65–99)
Potassium: 4.3 mmol/L (ref 3.5–5.1)
Sodium: 121 mmol/L — ABNORMAL LOW (ref 135–145)

## 2017-12-22 LAB — GLUCOSE, CAPILLARY
GLUCOSE-CAPILLARY: 186 mg/dL — AB (ref 65–99)
GLUCOSE-CAPILLARY: 149 mg/dL — AB (ref 65–99)
GLUCOSE-CAPILLARY: 138 mg/dL — AB (ref 65–99)
Glucose-Capillary: 131 mg/dL — ABNORMAL HIGH (ref 65–99)
Glucose-Capillary: 126 mg/dL — ABNORMAL HIGH (ref 65–99)

## 2017-12-22 LAB — CBC WITH DIFFERENTIAL/PLATELET
Basophils Absolute: 0 10*3/uL (ref 0.0–0.1)
Basophils Relative: 0 %
Eosinophils Absolute: 0.1 10*3/uL (ref 0.0–0.7)
Eosinophils Relative: 1 %
HEMATOCRIT: 24.1 % — AB (ref 39.0–52.0)
Hemoglobin: 7.7 g/dL — ABNORMAL LOW (ref 13.0–17.0)
LYMPHS ABS: 0.7 10*3/uL (ref 0.7–4.0)
Lymphocytes Relative: 8 %
MCH: 25.5 pg — ABNORMAL LOW (ref 26.0–34.0)
MCHC: 32 g/dL (ref 30.0–36.0)
MCV: 79.8 fL (ref 78.0–100.0)
MONO ABS: 1 10*3/uL (ref 0.1–1.0)
Monocytes Relative: 11 %
NEUTROS PCT: 80 %
Neutro Abs: 7.2 10*3/uL (ref 1.7–7.7)
PLATELETS: 151 10*3/uL (ref 150–400)
RBC: 3.02 MIL/uL — AB (ref 4.22–5.81)
RDW: 23.3 % — AB (ref 11.5–15.5)
WBC: 9 10*3/uL (ref 4.0–10.5)

## 2017-12-22 MED ORDER — SODIUM CHLORIDE 0.9% FLUSH: 10.0000 mL | INTRAVENOUS | Status: DC | PRN

## 2017-12-22 MED ORDER — PREMIER PROTEIN SHAKE
11.0000 [oz_av] | Freq: Two times a day (BID) | ORAL | Status: DC
  Administered 2017-12-22 – 2017-12-25 (×5): 11 [oz_av] via ORAL
  Filled 2017-12-22 (×18): qty 325.31

## 2017-12-22 MED ORDER — SODIUM CHLORIDE 0.9 % IV SOLN: 250.0000 mL | INTRAVENOUS | Status: DC

## 2017-12-22 MED ORDER — HEPARIN SOD (PORK) LOCK FLUSH 100 UNIT/ML IV SOLN: 250.0000 [IU] | INTRAVENOUS | Status: DC | PRN

## 2017-12-22 MED ORDER — SODIUM CHLORIDE 0.9% FLUSH: 3.0000 mL | INTRAVENOUS | Status: DC | PRN

## 2017-12-22 MED ORDER — LIDOCAINE HCL (PF) 2 % IJ SOLN
INTRAMUSCULAR | Status: AC
  Filled 2017-12-22: qty 20

## 2017-12-22 NOTE — Progress Notes (Signed)
Patient Demographics:    [name], is a 72 y.o. male, DOB - [birth year], WCH:[id number]  Admit date - 12/21/2017   Admitting Physician Karmen Bongo, MD  Outpatient Primary MD for the patient is [name], MD  LOS - 0   Chief Complaint  Patient presents with  . Abnormal Lab  . Weakness        Subjective:    [name] today has no fevers, no emesis,  No chest pain, bedside questions answered, complains of fatigue  Assessment  & Plan :    Principal Problem:   Fatigue Active Problems:   Essential hypertension   Type 2 diabetes mellitus (HCC)   OSA (obstructive sleep apnea)   Anemia due to chronic renal failure treated with erythropoietin, stage 3 (moderate) (HCC)   Chronic diastolic CHF (congestive heart failure) (HCC)   Acute renal failure superimposed on stage 4 chronic kidney disease (HCC)   Volume overload   Palliative care by specialist   Brief summary 71 y.o.malewith medical history significant forcoronary artery disease/status post CABG, aortic stenosis status post AVR, chronic kidney disease stage IV, cirrhosis with ascites, atrial fibrillation status post pacemaker not on anticoagulation, chronic diastolic CHF, insulin-dependent diabetes mellitus, and 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 transfusion, h/o  thrombocytopenia; OSA on CPAP; HTN; HLD; COPD; admitted on 12/21/17 with increasing weakness and abdominal distention consistent with ascites   Plan:- 1)Recurrent ascites in the setting of liver cirrhosis--patient has paracentesis from time to time, will arrange for repeat palliative/therapeutic paracentesis on 12/22/2017  2)Recurrent anemia-patient with chronic anemia of chronic blood loss compounded by anemia of CKD, small bowel enteroscopy from 11/01/2017 at [facility] showed 4 AVMs that were cauterized, patient has been requiring transfusions since (6  units of packed cells transfused over the last 6 weeks), check serial H&H transfuse to keep his hemoglobin above 8.5, CKD probably contributory to his anemia, if H&H drops patient may need repeat small bowel enteroscopy  3)Fatigue/OSA-patient has not been compliant with CPAP, anemia and liver cirrhosis as well as heart failure probably contributing to his fatigue  4)HFpEF-last known EF 60 to 65%, patient with history of chronic grade 2 diastolic dysfunction CHF, watch volume status closely   5)AKI----acute kidney injury on CKD stage - IV     creatinine on admission= 3.38 ,   baseline creatinine =   2.3  , creatinine is now= 3.45     ,  Avoid nephrotoxic agents/dehydration/hypotension, continue Lasix 40 mg every 12 hours watch renal function and electrolytes closely, metolazone starting on Sunday 12/24/17 at 5 mg every other day  6)HTN-stop metoprolol, use atenolol  given liver cirrhosis with possible portal hypertension   7)DM-last A1c is 5.1, patient is at risk for hypoglycemia, decrease NPH to 60 units twice daily from 65 units, Use Novolog/Humalog Sliding scale insulin with Accu-Cheks/Fingersticks as ordered   8)Social/Ethics--patient is a DNR/DNI, palliative care consult appreciated, patient would like to continue to attempt control of his GI bleed with GI procedures  DVT prophylaxis:  scd Code Status: DNR - confirmed with patient/family Family Communication: Wife  Disposition Plan:  Home once clinically improved Consults called: CM, palliative care, nutrition, PT   Lab Results  Component Value Date   PLT 151  12/22/2017    Inpatient Medications  Scheduled Meds: . allopurinol  100 mg Oral Daily  . atorvastatin  20 mg Oral q1800  . colchicine  0.6 mg Oral BID  . enoxaparin (LOVENOX) injection  30 mg Subcutaneous Q24H  . furosemide  40 mg Intravenous Q12H  . gabapentin  300 mg Oral QID  . insulin aspart  0-5 Units Subcutaneous QHS  . insulin aspart  0-9 Units Subcutaneous TID WC    . insulin NPH Human  65 Units Subcutaneous QHS  . insulin NPH Human  65 Units Subcutaneous QAC breakfast  . lidocaine      . lidocaine  1 application Urethral STAT  . [START ON 12/24/2017] metolazone  5 mg Oral QODAY  . metoprolol succinate  25 mg Oral BID  . pantoprazole  40 mg Oral Daily  . potassium chloride SA  40 mEq Oral BID  . protein supplement shake  11 oz Oral BID BM  . sertraline  50 mg Oral Daily  . sodium chloride flush  3 mL Intravenous Q12H  . tamsulosin  0.4 mg Oral QPC supper   Continuous Infusions: . sodium chloride     PRN Meds:.sodium chloride, acetaminophen **OR** acetaminophen, calcium carbonate (dosed in mg elemental calcium), camphor-menthol **AND** hydrOXYzine, docusate sodium, ondansetron **OR** ondansetron (ZOFRAN) IV, sodium chloride flush, sorbitol, zolpidem    Anti-infectives (From admission, onward)   None        Objective:   Vitals:   12/22/17 0700 12/22/17 0836 12/22/17 1206 12/22/17 1500  BP: 106/66 (!) 111/54 108/68 122/71  Pulse: 76 75 75   Resp:   18   Temp: 97.6 F (36.4 C)  97.6 F (36.4 C)   TempSrc: Oral  Oral   SpO2: 95% 100% 95%   Weight: 115.2 kg (254 lb)     Height:        Wt Readings from Last 3 Encounters:  12/22/17 115.2 kg (254 lb)  12/05/17 122.1 kg (269 lb 1.9 oz)  11/21/17 122 kg (268 lb 14.4 oz)     Intake/Output Summary (Last 24 hours) at 12/22/2017 1923 Last data filed at 12/22/2017 1841 Gross per 24 hour  Intake 1443 ml  Output 1553 ml  Net -110 ml     Physical Exam  Gen:- Awake Alert,  Obese, looks tired HEENT:- [place].AT, No sclera icterus Neck-Supple Neck,No JVD,.  Lungs-  CTAB , diminished in bases  CV- S1, S2 normal Abd-  +ve B.Sounds, Abd Soft, increased abdominal distention    Extremity/Skin:-No jaundice, warm and dry Psych-affect is appropriate, oriented x3 Neuro-no new focal deficits, no tremors   Data Review:   Micro Results No results found for this or any previous visit (from the past  240 hour(s)).  Radiology Reports Dg Chest 2 View  Result Date: 12/21/2017 CLINICAL DATA:  Shortness of breath. EXAM: CHEST - 2 VIEW COMPARISON:  Radiograph of September 26, 2017. FINDINGS: Stable cardiomegaly with central pulmonary vascular congestion. Status post coronary artery bypass graft. Single lead left-sided pacemaker is unchanged in position. Status post aortic valve replacement. No pneumothorax or pleural effusion is noted. No consolidative process is noted. Bony thorax is unremarkable. IMPRESSION: Stable cardiomegaly with central pulmonary vascular congestion. Electronically Signed   By: Marijo Conception, M.D.   On: 12/21/2017 13:21   US Paracentesis  Result Date: 12/07/2017 INDICATION: Recurrent ascites EXAM: ULTRASOUND-GUIDED PARACENTESIS COMPARISON:  Previous paracentesis. MEDICATIONS: 10 cc 1% lidocaine. COMPLICATIONS: None immediate TECHNIQUE: Informed written consent was obtained from the patient  after a discussion of the risks, benefits and alternatives to treatment. A timeout was performed prior to the initiation of the procedure. Initial ultrasound scanning demonstrates a large amount of ascites within the right lower abdominal quadrant. The right lower abdomen was prepped and draped in the usual sterile fashion. 1% lidocaine with epinephrine was used for local anesthesia. Under direct ultrasound guidance, a 19 gauge, 7-cm, Yueh catheter was introduced. An ultrasound image was saved for documentation purposed. The paracentesis was performed. The catheter was removed and a dressing was applied. The patient tolerated the procedure well without immediate post procedural complication. FINDINGS: A total of approximately 8 liters of yellow fluid was removed. IMPRESSION: Successful ultrasound-guided paracentesis yielding 8 liters of peritoneal fluid. Read by Lavonia Drafts [facility] Electronically Signed   By: Lavonia Dana M.D.   On: 12/07/2017 09:51   Ir Paracentesis  Result Date:  12/22/2017 INDICATION: Patient with recurrent ascites. Request is made for therapeutic paracentesis. EXAM: ULTRASOUND GUIDED THERAPEUTIC PARACENTESIS MEDICATIONS: 10 mL 2% lidocaine COMPLICATIONS: None immediate. PROCEDURE: Informed written consent was obtained from the patient after a discussion of the risks, benefits and alternatives to treatment. A timeout was performed prior to the initiation of the procedure. Initial ultrasound scanning demonstrates a large amount of ascites within the right lower abdominal quadrant. The right lower abdomen was prepped and draped in the usual sterile fashion. 2% lidocaine was used for local anesthesia. Following this, a 19 gauge, 7-cm, Yueh catheter was introduced. An ultrasound image was saved for documentation purposes. The paracentesis was performed. The catheter was removed and a dressing was applied. The patient tolerated the procedure well without immediate post procedural complication. FINDINGS: A total of approximately 5.5 L of yellow fluid was removed. IMPRESSION: Successful ultrasound-guided therapeutic paracentesis yielding 5.5 liters of peritoneal fluid. Read by: Brynda Greathouse PA-C Electronically Signed   By: Jacqulynn Cadet M.D.   On: 12/22/2017 17:36     CBC Recent Labs  Lab 12/19/17 0838 12/21/17 1231 12/22/17 0045  WBC 10.0 8.2 9.0  HGB 6.7* 8.2* 7.7*  HCT 21.7* 25.6* 24.1*  PLT 169 150 151  MCV 77.8* 80.0 79.8  MCH 24.0* 25.6* 25.5*  MCHC 30.9* 32.0 32.0  RDW 24.8* 23.1* 23.3*  LYMPHSABS 0.7*  --  0.7  MONOABS 0.7  --  1.0  EOSABS 0.1  --  0.1  BASOSABS 0.1  --  0.0    Chemistries  Recent Labs  Lab 12/21/17 1231 12/22/17 0459  NA 127* 121*  K 3.5 4.3  CL 89* 87*  CO2 23 20*  GLUCOSE 127* 127*  BUN 92* 89*  CREATININE 3.38* 3.45*  CALCIUM 8.5* 8.7*   ------------------------------------------------------------------------------------------------------------------ No results for input(s): CHOL, HDL, LDLCALC, TRIG, CHOLHDL,  LDLDIRECT in the last 72 hours.  Lab Results  Component Value Date   HGBA1C 5.1 12/21/2017   ------------------------------------------------------------------------------------------------------------------ No results for input(s): TSH, T4TOTAL, T3FREE, THYROIDAB in the last 72 hours.  Invalid input(s): FREET3 ------------------------------------------------------------------------------------------------------------------ No results for input(s): VITAMINB12, FOLATE, FERRITIN, TIBC, IRON, RETICCTPCT in the last 72 hours.  Coagulation profile No results for input(s): INR, PROTIME in the last 168 hours.  No results for input(s): DDIMER in the last 72 hours.  Cardiac Enzymes No results for input(s): CKMB, TROPONINI, MYOGLOBIN in the last 168 hours.  Invalid input(s): CK ------------------------------------------------------------------------------------------------------------------    Component Value Date/Time   BNP 154.3 (H) 09/12/2016 1540     [name] M.D on 12/22/2017 at 7:23 PM  Between 7am to 7pm - Pager -  [phone]  After 7pm go to [email or web address] - password [email or web address]  [facility] -  Office  [phone]   Voice Recognition Viviann Spare dictation system was used to create this note, attempts have been made to correct errors. Please contact the author with questions and/or clarifications.

## 2017-12-22 NOTE — Progress Notes (Signed)
Initial Nutrition Assessment  DOCUMENTATION CODES:   Obesity unspecified  INTERVENTION:  Provide Premier Protein po BID, each supplement provides 160 kcal and 30 grams of protein.   Encourage adequate PO intake.   NUTRITION DIAGNOSIS:   Increased nutrient needs related to chronic illness(COPD, cirrhosis, CHF) as evidenced by estimated needs.  GOAL:   Patient will meet greater than or equal to 90% of their needs  MONITOR:   PO intake, Supplement acceptance, Labs, Weight trends, I & O's, Skin  REASON FOR ASSESSMENT:   Consult (heart failure)  ASSESSMENT:   72 y.o. male with medical history significant of pacemaker; thrombocytopenia; OSA on CPAP; HTN; HLD; DM; COPD; CKD and associated anemia; cirrhosis; chronic diastolic heart failure (grade 2 diastolic dysfunction); CAD; and afib off AC due to h/o GI bleeds presenting with weakness and fatigue.  Pt was unavailable and working with therapy during time of visit. RD unable to obtain most recent nutrition history. Meal completion has been 75-100% today. Pt with a 5.5% weight loss in 17 days per weight records. Weight loss likely related to fluid status. Noted pt volume overloaded and currently on lasix. RD to order nutritional supplements to aid in caloric and protein needs.   Unable to complete Nutrition-Focused physical exam at this time.   Labs and medications reviewed.  Diet Order:   Diet Order           Diet heart healthy/carb modified Room service appropriate? Yes; Fluid consistency: Thin  Diet effective now          EDUCATION NEEDS:   Not appropriate for education at this time  Skin:  Skin Assessment: Reviewed RN Assessment  Last BM:  5/28  Height:   Ht Readings from Last 1 Encounters:  12/21/17 5\' 8"  (1.727 m)    Weight:   Wt Readings from Last 1 Encounters:  12/22/17 254 lb (115.2 kg)    Ideal Body Weight:  70 kg  BMI:  Body mass index is 38.62 kg/m.  Estimated Nutritional Needs:   Kcal:   2100-2300  Protein:  115-135 grams  Fluid:  Per MD    Corrin Parker, MS, RD, LDN Pager # [phone] After hours/ weekend pager # [phone]

## 2017-12-22 NOTE — Evaluation (Signed)
Physical Therapy Evaluation Patient Details Name: [name] MRN: [id number] DOB: 1946/05/11 Today's Date: 12/22/2017   History of Present Illness  [name] is a 72 y.o. male with medical history significant of pacemaker; thrombocytopenia; OSA on CPAP; HTN; HLD; DM; COPD; CKD and associated anemia; cirrhosis; chronic diastolic heart failure (grade 2 diastolic dysfunction); CAD; and afib off AC due to h/o GI bleeds presenting with "I just don't have any energy".  It has been slowly progressing for about a month.  It finally got to the point this AM that he couldn't even get off the commode by himself.  He usually feels much better after blood transfusion, but he received 2 units Tuesday and it didn't make any improvement. He is chronically SOB, possibly just slightly worse.  No cough.  Mild swelling in feet/ankles.  He is just extremely weak, "just sick", can't walk or get up and down.     Clinical Impression  Pt admitted with above diagnosis. Pt currently with functional limitations due to the deficits listed below (see PT Problem List). PTA, pt has been household ambulatory for the last 6 months, utilizing RW for shot distances. However over last month he has had a steady decline in weakness and function, and more recently since most recent blood transfusion 5/27 he has been far more weak. Wife is concerned for his current level of function and her capacity to assist him at this point. Upon eval pt presents very weak and requring max A x2 to stand and pivot to bedside commode. Patient unsteady on his feet and unsafe to attempt ambulation this visit. Patient wishes to get home, emotional but accepting of idea of post acute rehab. SpO2 WNL on RA during session.  Will benefit from skilled PT to increase their independence and safety with mobility to allow discharge to the venue listed below.       Follow Up Recommendations SNF;Supervision/Assistance - 24 hour    Equipment Recommendations  (TBD next venue)    Recommendations for Other Services OT consult     Precautions / Restrictions Precautions Precautions: Fall Restrictions Weight Bearing Restrictions: No      Mobility  Bed Mobility               General bed mobility comments: sitting EOB at entry  Transfers Overall transfer level: Needs assistance   Transfers: Sit to/from Stand;Stand Pivot Transfers Sit to Stand: Max assist;+2 physical assistance Stand pivot transfers: Mod assist;+2 physical assistance       General transfer comment: requried max A x2 to stand tonight, patient is very weak and unable to stand without physical support. shaky on his feet.   Ambulation/Gait             General Gait Details: unable 2/2 weakness  Stairs            Wheelchair Mobility    Modified Rankin (Stroke Patients Only)       Balance Overall balance assessment: Needs assistance   Sitting balance-Leahy Scale: Fair       Standing balance-Leahy Scale: Poor                               Pertinent Vitals/Pain Pain Assessment: No/denies pain    Home Living Family/patient expects to be discharged to:: Skilled nursing facility Living Arrangements: Spouse/significant other   Type of Home: House Home Access: Level entry     Home Layout: One level  Prior Function Level of Independence: Needs assistance   Gait / Transfers Assistance Needed: limited to hosuehold ambulation over last 6 months, was walking in home with RW a month ago but has gotten weaker, then last tuesday 5/27 after blood tranfusion,  has felt even more and more weakl  ADL's / Homemaking Assistance Needed: wife assists        Hand Dominance        Extremity/Trunk Assessment   Upper Extremity Assessment Upper Extremity Assessment: Defer to OT evaluation;Generalized weakness    Lower Extremity Assessment Lower Extremity Assessment: Generalized weakness       Communication      Cognition  Arousal/Alertness: Awake/alert Behavior During Therapy: WFL for tasks assessed/performed Overall Cognitive Status: Within Functional Limits for tasks assessed                                        General Comments      Exercises     Assessment/Plan    PT Assessment Patient needs continued PT services  PT Problem List Decreased strength;Decreased range of motion;Decreased activity tolerance;Decreased mobility;Decreased balance;Decreased coordination;Obesity;Cardiopulmonary status limiting activity       PT Treatment Interventions DME instruction;Gait training;Stair training;Functional mobility training;Therapeutic activities;Therapeutic exercise;Balance training    PT Goals (Current goals can be found in the Care Plan section)  Acute Rehab PT Goals Patient Stated Goal: return home Time For Goal Achievement: 12/29/17 Potential to Achieve Goals: Good    Frequency Min 2X/week   Barriers to discharge        Co-evaluation               AM-PAC PT "6 Clicks" Daily Activity  Outcome Measure Difficulty turning over in bed (including adjusting bedclothes, sheets and blankets)?: A Lot Difficulty moving from lying on back to sitting on the side of the bed? : A Lot Difficulty sitting down on and standing up from a chair with arms (e.g., wheelchair, bedside commode, etc,.)?: A Lot Help needed moving to and from a bed to chair (including a wheelchair)?: A Lot Help needed walking in hospital room?: A Lot Help needed climbing 3-5 steps with a railing? : Total 6 Click Score: 11    End of Session Equipment Utilized During Treatment: Gait belt Activity Tolerance: Patient limited by fatigue Patient left: in bed;with call bell/phone within reach;with nursing/sitter in room Nurse Communication: Mobility status PT Visit Diagnosis: Unsteadiness on feet (R26.81);Other abnormalities of gait and mobility (R26.89);Repeated falls (R29.6);Muscle weakness (generalized)  (M62.81)    Time: 5573-2202 PT Time Calculation (min) (ACUTE ONLY): 28 min   Charges:   PT Evaluation $PT Eval Low Complexity: 1 Low PT Treatments $Therapeutic Activity: 8-22 mins   PT G Codes:      Reinaldo Berber, PT, DPT Acute Rehab Services Pager: [phone]    Reinaldo Berber 12/22/2017, 9:45 PM

## 2017-12-22 NOTE — Consult Note (Signed)
   [facility] Inpatient Consult   12/22/2017  [name] 1945/10/02 [id number]  Patient screened for potential [facility] services. Patient is in the [place] [facility] the [facility] services under patient's Medicare plan.  Patient was off the unit, spoke with wife in the room.  She states that she is hopeful that her husband can get additional follow up at [facility] or [facility] or wherever to find out what is causing his bleeding. A brochure and 24 hour nurse advise line with contact information.  His disposition and progress is currently unknown. Please place a [facility] Care Management consult or for questions contact:   Natividad Brood, RN BSN [facility] Liaison  [phone] business mobile phone Toll free office [phone]     Natividad Brood, RN BSN [facility] Liaison  [phone] business mobile phone Toll free office [phone]

## 2017-12-22 NOTE — Consult Note (Signed)
Consultation Note Date: 12/22/2017   Patient Name: [name]  DOB: [birth year]  MRN: [id number]  Age / Sex: 72 y.o., male  PCP: [name], MD Referring Physician: [name], MD  Reason for Consultation: Establishing goals of care and Psychosocial/spiritual support  HPI/Patient Profile: 72 y.o. male  with past medical history of coronary artery disease status post CABG 2004, cardiac stenting 2012, atrial fib with ablation, congestive heart failure, obstructive sleep apnea, pacemaker, history of GI bleed with recent hospital admission to [facility] for cauterization in April 2019, diabetes, chronic kidney disease stage III, cirrhosis requiring paracentesis approximately every 2 to 3 weeks for the past 2 years, COPD admitted on 12/21/2017 with increased weakness and fatigue.  Prior to admission patient was able to ambulate with a walker but most recently had been requiring a wheelchair.  He verbalizes today he feels somewhat better and believes that he could walk with a walker.  His wife is at the bedside  Consult ordered for goals of care in the setting of progressive, chronic, medical conditions.  Patient was seen by palliative provider at [facility] in February 2019.   Clinical Assessment and Goals of Care: Patient seen, chart reviewed.  Patient is alert and oriented x3.  He is sitting on the edge of the bed.  He is able to participate in goals of care discussion.  His wife, [name] is at the bedside.  Patient and wife verbalize good understanding of his current clinical condition with recurrent GI bleed.  He has had multiple transfusions and now appears to be bleeding again.  He was recently seen at [facility] for cauterization in April 2019.  Both he and his wife, report wanting to go through this procedure again if possible; "we cannot give up".  His family has undergone a lot of losses.  Their  daughter died from endometrial cancer approximately 2 years ago, patient's wife is ill and debilitated, and thier grown son who lives with him is diabetic type I, on hemodialysis.  [name] seems to show good grasp that he how ill he is but is trying to do everything he can do to both maintain hope, as well as extend his life.  He has been undergoing paracentesis for cirrhosis every 2 to 3 weeks now for approximately 2 years  Patient at this point is able to participate in his own decision making.  In the event he were unable to, his healthcare proxy, would be his wife, [name]    SUMMARY OF RECOMMENDATIONS   Confirmed DNR/DNI Would like to have further conversations with GI, specifically undergo cauterization of areas of GI bleed.  They would be willing to be transferred to [facility]. Patient also would like to undergo paracentesis as its been 3 weeks since he is had fluid drained I do not believe the [facility] will be able to come to any further goals of care decision until he receives information regarding if any further treatment is available for recurrent GI bleed Code Status/Advance Care  Planning:  DNR    Symptom Management:   Pain: Patient has severe neuropathy and is on gabapentin 300 mg 4 times daily.  With his worsening renal function, pushing this dose higher would likely cause more renal dysfunction; methadone is a good option for neuropathy as its 1 of the only traditional opioids to target neuropathic pain but at this point would not recommend changing until we ascertain what next steps are for [name]  Palliative Prophylaxis:   Aspiration, Bowel Regimen, Delirium Protocol, Frequent Pain Assessment, Oral Care and Turn Reposition  Additional Recommendations (Limitations, Scope, Preferences):  No Artificial Feeding, No Chemotherapy, No Radiation and No Tracheostomy  Psycho-social/Spiritual:   Desire for further Chaplaincy support:no  Additional Recommendations:  Referral to Community Resources   Prognosis:   Unable to determine  Discharge Planning:  Requesting transfer to [facility] for further GI management      Primary Diagnoses: Present on Admission: . Volume overload . Fatigue . Acute renal failure superimposed on stage 4 chronic kidney disease ([place]) . Anemia due to chronic renal failure treated with erythropoietin, stage 3 (moderate) (HCC) . Chronic diastolic CHF (congestive heart failure) ([place]) . Essential hypertension . OSA (obstructive sleep apnea)   I have reviewed the medical record, interviewed the patient and family, and examined the patient. The following aspects are pertinent.  Past Medical History:  Diagnosis Date  . Aortic stenosis 03/08/2012   a.  s/p tissue AVR 10/13 with Dr. [name];   b. Echo 10/13: mod LVH, EF 55-60%, tissue AVR not well seen, no leak, gradient not too high (mean 12mHg), MAC, mild Matthew, mild LAE, PASP 38  . Ascites    status post paracentesis with removal of 3.4 L of ascitic fluid  . Atrial fibrillation ([place]    Permanent; off of Coumadin for now due to GI bleed  . AVM (arteriovenous malformation)    Recurrent GI bleeding requiring multiple transfusions  . CAD (coronary artery disease)    a. s/p CABG 2004;  [facility]:  [facility] 8/13: Free radial to obtuse marginal patent, SVG-diagonal patent, LIMA-LAD patent, EF 65-70%, mean aortic valve gradient 42  . Carotid bruit 06/14/2011   a. pre-AVR dopplers 10/13: no sig ICA stenosis  . Chronic diastolic heart failure ([place]   . Chronic kidney disease   . Cirrhosis ([place]   . COPD (chronic obstructive pulmonary disease) ([place]   . Depression   . DM2 (diabetes mellitus, type 2) (HCC)    at least 10 yrs  . Dyspnea   . GERD (gastroesophageal reflux disease)   . Gout   . H/O hiatal hernia   . Headache   . Hyperlipidemia   . Hypertension    x 15 yrs  . Insomnia   . Iron deficiency anemia    Requiring intravenous iron  . Mediastinal adenopathy  09/22/2011  . OSA (obstructive sleep apnea) 1999   does not wear CPAP regularly  . Osteoarthritis   . Overweight(278.02)   . Presence of permanent cardiac pacemaker   . Thrombocytopenia ([place]    Social History   Socioeconomic History  . Marital status: Married    Spouse name: Not on file  . Number of children: 3  . Years of education: Not on file  . Highest education level: Not on file  Occupational History  . Occupation: retired    Comment: MAssociate Professorat [employer] . Financial resource strain: Not on file  . Food insecurity:    Worry:  Not on file    Inability: Not on file  . Transportation needs:    Medical: Not on file    Non-medical: Not on file  Tobacco Use  . Smoking status: Former Smoker    Packs/day: 1.00    Years: 30.00    Pack years: 30.00    Types: Cigarettes    Last attempt to quit: 07/25/2000    Years since quitting: 17.4  . Smokeless tobacco: Never Used  . Tobacco comment: Quit smoking in 2002  Substance and Sexual Activity  . Alcohol use: No    Alcohol/week: 0.6 oz    Types: 1 Shots of liquor per week    Comment: stopped drinking alcohol  . Drug use: No  . Sexual activity: Yes  Lifestyle  . Physical activity:    Days per week: Not on file    Minutes per session: Not on file  . Stress: Not on file  Relationships  . Social connections:    Talks on phone: Not on file    Gets together: Not on file    Attends religious service: Not on file    Active member of club or organization: Not on file    Attends meetings of clubs or organizations: Not on file    Relationship status: Not on file  Other Topics Concern  . Not on file  Social History Narrative   Lives in [place], [place] with wife.    Family History  Problem Relation Age of Onset  . Hypertension Father   . Diabetes Father   . Heart disease Father   . COPD Sister   . Cancer Maternal Aunt        Breast cancer   . Cancer Maternal Aunt        Breast cancer   . Diabetes Son    . Cancer Daughter        Cervical cancer  . Coronary artery disease Unknown        FAMILY HISTORY   Scheduled Meds: . allopurinol  100 mg Oral Daily  . atorvastatin  20 mg Oral q1800  . colchicine  0.6 mg Oral BID  . enoxaparin (LOVENOX) injection  30 mg Subcutaneous Q24H  . furosemide  40 mg Intravenous Q12H  . gabapentin  300 mg Oral QID  . insulin aspart  0-5 Units Subcutaneous QHS  . insulin aspart  0-9 Units Subcutaneous TID WC  . insulin NPH Human  65 Units Subcutaneous QHS  . insulin NPH Human  65 Units Subcutaneous QAC breakfast  . lidocaine  1 application Urethral STAT  . [START ON 12/24/2017] metolazone  5 mg Oral QODAY  . metoprolol succinate  25 mg Oral BID  . pantoprazole  40 mg Oral Daily  . potassium chloride SA  40 mEq Oral BID  . sertraline  50 mg Oral Daily  . sodium chloride flush  3 mL Intravenous Q12H  . tamsulosin  0.4 mg Oral QPC supper   Continuous Infusions: . sodium chloride     PRN Meds:.sodium chloride, acetaminophen **OR** acetaminophen, calcium carbonate (dosed in mg elemental calcium), camphor-menthol **AND** hydrOXYzine, docusate sodium, ondansetron **OR** ondansetron (ZOFRAN) IV, sodium chloride flush, sorbitol, zolpidem Medications Prior to Admission:  Prior to Admission medications   Medication Sig Start Date End Date Taking? Authorizing Provider  acetaminophen (TYLENOL) 500 MG tablet Take 1,000 mg by mouth at bedtime as needed for mild pain or headache.    Yes [provider]  allopurinol (ZYLOPRIM) 100 MG tablet Take  1 tablet (100 mg total) by mouth daily. 04/18/17  Yes Larey Dresser, MD  atorvastatin (LIPITOR) 20 MG tablet Take 20 mg by mouth daily at 6 PM.    Yes [provider]  colchicine 0.6 MG tablet Take 0.6 mg by mouth 2 (two) times daily. 10/03/17  Yes [provider]  gabapentin (NEURONTIN) 300 MG capsule Take 300 mg by mouth 4 (four) times daily.  12/31/14  Yes [provider]  insulin NPH Human  (HUMULIN N,NOVOLIN N) 100 UNIT/ML injection Inject 65 Units into the skin 2 (two) times daily before a meal.    Yes [provider]  insulin regular (NOVOLIN R,HUMULIN R) 100 units/mL injection Inject 30 Units into the skin 2 (two) times daily before a meal.    Yes [provider]  metolazone (ZAROXOLYN) 5 MG tablet Take 1 tablet (5 mg total) by mouth every other day. 10/05/17 01/03/18 Yes Larey Dresser, MD  metoprolol succinate (TOPROL-XL) 25 MG 24 hr tablet Take 1 tablet (25 mg total) by mouth 2 (two) times daily. 11/01/17  Yes Larey Dresser, MD  Multiple Vitamins-Minerals ([employer] SPECTRAVITE ADULT 50+ PO) Take 1 tablet by mouth daily.   Yes [provider]  ondansetron (ZOFRAN) 8 MG tablet Take 1 tablet (8 mg total) by mouth every 8 (eight) hours as needed for nausea or vomiting. 12/14/17  Yes Perlov, Marinell Blight, MD  pantoprazole (PROTONIX) 40 MG tablet Take 1 tablet (40 mg total) by mouth 2 (two) times daily before a meal. Patient taking differently: Take 40 mg by mouth daily.  08/27/17  Yes Tat, Shanon Brow, MD  potassium chloride SA (K-DUR,KLOR-CON) 20 MEQ tablet Take 2 tablets (40 mEq total) by mouth 2 (two) times daily. 10/31/17  Yes Larey Dresser, MD  PROAIR HFA 108 [phone] Base) MCG/ACT inhaler Take 2 puffs by mouth 2 (two) times daily. 03/28/17  Yes [provider]  sertraline (ZOLOFT) 50 MG tablet Take 1 tablet (50 mg total) by mouth daily. 09/01/17  Yes Larey Dresser, MD  spironolactone (ALDACTONE) 25 MG tablet Take 1 tablet (25 mg total) by mouth daily. 10/18/17  Yes Larey Dresser, MD  tamsulosin (FLOMAX) 0.4 MG CAPS capsule TAKE ONE CAPSULE BY MOUTH EVERY DAY AFTER SUPPER 07/11/17  Yes Larey Dresser, MD  torsemide (DEMADEX) 20 MG tablet TAKE 6 TABLETS (120 MG TOTAL) BY MOUTH 2 (TWO) TIMES DAILY. 11/14/17  Yes Larey Dresser, MD  vitamin C (ASCORBIC ACID) 500 MG tablet Take 500 mg by mouth 2 (two) times daily.   Yes [provider]   Allergies    Allergen Reactions  . Codeine Other (See Comments) and Palpitations    Hurting in chest  . Niacin Other (See Comments)    Felt a "severe burning sensation" Hot flashes  . Aspirin Other (See Comments)    History of Bleeding ulcers   . Diltiazem Hcl Other (See Comments)    Gets hot, hives, itching   Review of Systems  Unable to perform ROS: Other    Physical Exam  Constitutional: He is oriented to person, place, and time.  Older man, sitting on edge of bed; no acute distress  HENT:  Head: Normocephalic and atraumatic.  Cardiovascular: Normal rate.  Pulmonary/Chest:  Poor respiratory effort  Abdominal:  Ascites  Musculoskeletal: Normal range of motion.  Neurological: He is alert and oriented to person, place, and time.  Skin: Skin is warm and dry. There is pallor.  Psychiatric: His behavior  is normal. Thought content normal.  Affect constricted  Nursing note and vitals reviewed.   Vital Signs: BP (!) 111/54   Pulse 75   Temp 97.6 F (36.4 C) (Oral)   Resp 19   Ht _0  (1.727 m)   Wt 115.2 kg (254 lb)   SpO2 100%   BMI 38.62 kg/m  Pain Scale: 0-10   Pain Score: 0-No pain   SpO2: SpO2: 100 % O2 Device:SpO2: 100 % O2 Flow Rate: .   IO: Intake/output summary:   Intake/Output Summary (Last 24 hours) at 12/22/2017 1156 Last data filed at 12/22/2017 0844 Gross per 24 hour  Intake 963 ml  Output 1150 ml  Net -187 ml    LBM: Last BM Date: 12/19/17 Baseline Weight: Weight: 122 kg (269 lb) Most recent weight: Weight: 115.2 kg (254 lb)     Palliative Assessment/Data:   Flowsheet Rows     Most Recent Value  Intake Tab  Referral Department  Hospitalist  Unit at Time of Referral  Intermediate Care Unit  Palliative Care Primary Diagnosis  Other (Comment)  Date Notified  12/21/17  Palliative Care Type  Return patient Palliative Care  Reason for referral  Clarify Goals of Care  Date of Admission  12/21/17  Date first seen by Palliative Care  12/22/17  # of  days Palliative referral response time  1 Day(s)  # of days IP prior to Palliative referral  0  Clinical Assessment  Palliative Performance Scale Score  40%  Pain Max last 24 hours  Not able to report  Pain Min Last 24 hours  Not able to report  Dyspnea Max Last 24 Hours  Not able to report  Dyspnea Min Last 24 hours  Not able to report  Nausea Max Last 24 Hours  Not able to report  Nausea Min Last 24 Hours  Not able to report  Anxiety Max Last 24 Hours  Not able to report  Anxiety Min Last 24 Hours  Not able to report  Other Max Last 24 Hours  Not able to report  Psychosocial & Spiritual Assessment  Palliative Care Outcomes  Patient/Family meeting held?  Yes  Who was at the meeting?  pt and wife  Palliative Care Outcomes  Provided psychosocial or spiritual support  Palliative Care follow-up planned  Yes, Facility      Time In: 1125 Time Out: 1205 Time Total: 70 min Greater than 50%  of this time was spent counseling and coordinating care related to the above assessment and plan. Staffed with Dr. Denton Brick  Signed by: Dory Horn, NP   Please contact Palliative Medicine Team phone at [phone] for questions and concerns.  For individual provider: See Shea Evans

## 2017-12-22 NOTE — Progress Notes (Signed)
Placed pt on cpap with pressure of 10 and pt tolerating well. RT to monitor as needed.

## 2017-12-23 LAB — GLUCOSE, CAPILLARY
GLUCOSE-CAPILLARY: 118 mg/dL — AB (ref 65–99)
Glucose-Capillary: 105 mg/dL — ABNORMAL HIGH (ref 65–99)
Glucose-Capillary: 129 mg/dL — ABNORMAL HIGH (ref 65–99)
Glucose-Capillary: 151 mg/dL — ABNORMAL HIGH (ref 65–99)

## 2017-12-23 LAB — BASIC METABOLIC PANEL
Anion gap: 12 (ref 5–15)
BUN: 90 mg/dL — AB (ref 6–20)
CHLORIDE: 88 mmol/L — AB (ref 101–111)
CO2: 22 mmol/L (ref 22–32)
CREATININE: 3.08 mg/dL — AB (ref 0.61–1.24)
Calcium: 8.9 mg/dL (ref 8.9–10.3)
GFR calc Af Amer: 22 mL/min — ABNORMAL LOW (ref >60)
GFR calc non Af Amer: 19 mL/min — ABNORMAL LOW (ref >60)
Glucose, Bld: 111 mg/dL — ABNORMAL HIGH (ref 65–99)
Potassium: 4.7 mmol/L (ref 3.5–5.1)
Sodium: 122 mmol/L — ABNORMAL LOW (ref 135–145)

## 2017-12-23 LAB — CBC WITH DIFFERENTIAL/PLATELET
BASOS PCT: 0 %
Basophils Absolute: 0 10*3/uL (ref 0.0–0.1)
EOS PCT: 1 %
Eosinophils Absolute: 0.1 10*3/uL (ref 0.0–0.7)
HCT: 23.4 % — ABNORMAL LOW (ref 39.0–52.0)
Hemoglobin: 7.5 g/dL — ABNORMAL LOW (ref 13.0–17.0)
LYMPHS PCT: 10 %
Lymphs Abs: 0.6 10*3/uL — ABNORMAL LOW (ref 0.7–4.0)
MCH: 25.6 pg — AB (ref 26.0–34.0)
MCHC: 32.1 g/dL (ref 30.0–36.0)
MCV: 79.9 fL (ref 78.0–100.0)
Monocytes Absolute: 0.6 10*3/uL (ref 0.1–1.0)
Monocytes Relative: 10 %
NEUTROS PCT: 79 %
Neutro Abs: 5.1 10*3/uL (ref 1.7–7.7)
Platelets: 149 10*3/uL — ABNORMAL LOW (ref 150–400)
RBC: 2.93 MIL/uL — ABNORMAL LOW (ref 4.22–5.81)
RDW: 23.7 % — ABNORMAL HIGH (ref 11.5–15.5)
WBC: 6.4 10*3/uL (ref 4.0–10.5)

## 2017-12-23 LAB — PREPARE RBC (CROSSMATCH)

## 2017-12-23 MED ORDER — SODIUM CHLORIDE 0.9 % IV SOLN
Freq: Once | INTRAVENOUS | Status: DC
Start: 2017-12-23 — End: 2017-12-27

## 2017-12-23 MED ORDER — FUROSEMIDE 10 MG/ML IJ SOLN
20.0000 mg | Freq: Once | INTRAMUSCULAR | Status: AC
  Administered 2017-12-23: 20 mg via INTRAVENOUS
  Filled 2017-12-23: qty 2

## 2017-12-23 MED ORDER — FUROSEMIDE 10 MG/ML IJ SOLN
20.0000 mg | Freq: Two times a day (BID) | INTRAMUSCULAR | Status: DC
  Administered 2017-12-23 – 2017-12-25 (×4): 20 mg via INTRAVENOUS
  Filled 2017-12-23 (×4): qty 2

## 2017-12-23 MED ORDER — INSULIN NPH (HUMAN) (ISOPHANE) 100 UNIT/ML ~~LOC~~ SUSP
60.0000 [IU] | Freq: Every day | SUBCUTANEOUS | Status: DC
  Administered 2017-12-25 – 2017-12-26 (×2): 60 [IU] via SUBCUTANEOUS

## 2017-12-23 MED ORDER — ATENOLOL 25 MG PO TABS
12.5000 mg | ORAL_TABLET | Freq: Two times a day (BID) | ORAL | Status: DC
  Administered 2017-12-23 – 2017-12-25 (×4): 12.5 mg via ORAL
  Filled 2017-12-23 (×6): qty 0.5

## 2017-12-23 MED ORDER — INSULIN NPH (HUMAN) (ISOPHANE) 100 UNIT/ML ~~LOC~~ SUSP
60.0000 [IU] | Freq: Every day | SUBCUTANEOUS | Status: DC
  Administered 2017-12-23 – 2017-12-27 (×4): 60 [IU] via SUBCUTANEOUS
  Filled 2017-12-23: qty 10

## 2017-12-23 NOTE — Progress Notes (Signed)
Patient sleeping during shift report.      

## 2017-12-23 NOTE — Plan of Care (Signed)
  Problem: Clinical Measurements: Goal: Cardiovascular complication will be avoided Outcome: Progressing   Problem: Clinical Measurements: Goal: Diagnostic test results will improve Outcome: Progressing   Problem: Nutrition: Goal: Adequate nutrition will be maintained Outcome: Progressing

## 2017-12-23 NOTE — Progress Notes (Addendum)
Patient Demographics:    [name], is a 72 y.o. male, DOB - [birth year], ZOX:[id number]  Admit date - 12/21/2017   Admitting Physician Karmen Bongo, MD  Outpatient Primary MD for the patient is [name], MD  LOS - 1   Chief Complaint  Patient presents with  . Abnormal Lab  . Weakness        Subjective:    [name] today has no fevers, no emesis,  No chest pain, no BM, less short of breath after paracentensis on 12/22/17  Assessment  & Plan :    Principal Problem:   Fatigue Active Problems:   Essential hypertension   Type 2 diabetes mellitus (HCC)   OSA (obstructive sleep apnea)   Anemia due to chronic renal failure treated with erythropoietin, stage 3 (moderate) (HCC)   Chronic diastolic CHF (congestive heart failure) (HCC)   Acute renal failure superimposed on stage 4 chronic kidney disease (HCC)   Volume overload   Palliative care by specialist   Brief summary 71 y.o.malewith medical history significant forcoronary artery disease/status post CABG, aortic stenosis status post AVR, chronic kidney disease stage IV, cirrhosis with ascites, atrial fibrillation status post pacemaker not on anticoagulation, chronic diastolic CHF, insulin-dependent diabetes mellitus, and 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  Had paracentesis on 12/22/17 with 5.5 L of fluid removed  Transfuse 1 unit of packed cells over 3 hours, give Lasix 20 mg IV x1 after transfusion  Plan:- 1)Recurrent ascites in the setting of liver cirrhosis--patient has paracentesis from time to time, patient feels better after therapeutic/palliative paracentesis on 12/22/17 with 5.5 L of fluid removed  2)Recurrent anemia/Gi Bleed/AVMs-patient with chronic  anemia of chronic blood loss compounded by anemia of CKD, small bowel enteroscopy from 11/01/2017 at [facility] showed 4 AVMs that were cauterized, patient has been requiring transfusions since (6 units of packed cells transfused over the last 6 weeks),  transfuse to keep his hemoglobin above 8.5, so Transfuse 1 unit of packed cells over 3 hours, give Lasix 20 mg IV x1 after transfusion. CKD probably contributory to his anemia, if H&H drops patient may need repeat small bowel enteroscopy, discussed with Dr. Henrene Pastor from [facility], small bowel enteroscopy cannot be performed in [facility], patient will have to go back to [facility] or [facility]  3)Fatigue/OSA-patient has not been compliant with CPAP, anemia and liver cirrhosis as well as heart failure probably contributing to his fatigue  4)HFpEF-last known EF 60 to 65%, patient with history of chronic grade 2 diastolic dysfunction CHF, watch volume status closely , Lasix as ordered  5)AKI----improving AKI, acute kidney injury on CKD stage - IV     creatinine on admission= 3.38 ,   baseline creatinine =   2.3  , creatinine is now= 3.0     ,  Avoid nephrotoxic agents/dehydration/hypotension, continue Lasix 40 mg every 12 hours watch renal function and electrolytes closely, metolazone starting on Sunday 12/24/17 at 5 mg every other day  6)HTN-stopped metoprolol,c/n atenolol  given liver cirrhosis with possible portal hypertension   7)DM-last A1c is 5.1, patient is at risk for hypoglycemia, decrease NPH  to 60 units twice daily from 65 units, Use Novolog/Humalog Sliding scale insulin with Accu-Cheks/Fingersticks as ordered   8)Social/Ethics--patient is a DNR/DNI, palliative care consult appreciated, patient would like to continue to attempt control of his GI bleed with GI procedures  DVT prophylaxis:  scd Code Status: DNR - confirmed with patient/family Family Communication: Wife  Disposition Plan:  Home once clinically improved Consults called: CM, palliative  care, nutrition, PT   Lab Results  Component Value Date   PLT 149 (L) 12/23/2017    Inpatient Medications  Scheduled Meds: . allopurinol  100 mg Oral Daily  . atenolol  12.5 mg Oral BID  . atorvastatin  20 mg Oral q1800  . colchicine  0.6 mg Oral BID  . furosemide  20 mg Intravenous Q12H  . furosemide  20 mg Intravenous Once  . gabapentin  300 mg Oral QID  . insulin aspart  0-5 Units Subcutaneous QHS  . insulin aspart  0-9 Units Subcutaneous TID WC  . insulin NPH Human  60 Units Subcutaneous QHS  . [START ON 12/24/2017] insulin NPH Human  60 Units Subcutaneous QAC breakfast  . [START ON 12/24/2017] metolazone  5 mg Oral QODAY  . pantoprazole  40 mg Oral Daily  . potassium chloride SA  40 mEq Oral BID  . protein supplement shake  11 oz Oral BID BM  . sertraline  50 mg Oral Daily  . sodium chloride flush  3 mL Intravenous Q12H  . tamsulosin  0.4 mg Oral QPC supper   Continuous Infusions: . sodium chloride    . sodium chloride     PRN Meds:.sodium chloride, acetaminophen **OR** acetaminophen, calcium carbonate (dosed in mg elemental calcium), camphor-menthol **AND** hydrOXYzine, ondansetron **OR** ondansetron (ZOFRAN) IV, sodium chloride flush, sorbitol, zolpidem    Anti-infectives (From admission, onward)   None        Objective:   Vitals:   12/22/17 1500 12/22/17 1939 12/23/17 0333 12/23/17 1211  BP: 122/71 107/65 105/63 (!) 90/51  Pulse:  75 74 75  Resp:  20 18 18   Temp:  97.7 F (36.5 C) 98 F (36.7 C) 97.6 F (36.4 C)  TempSrc:  Oral Oral Oral  SpO2:  94% 99% 98%  Weight:   111.4 kg (245 lb 11.2 oz)   Height:        Wt Readings from Last 3 Encounters:  12/23/17 111.4 kg (245 lb 11.2 oz)  12/05/17 122.1 kg (269 lb 1.9 oz)  11/21/17 122 kg (268 lb 14.4 oz)     Intake/Output Summary (Last 24 hours) at 12/23/2017 1743 Last data filed at 12/23/2017 1739 Gross per 24 hour  Intake 720 ml  Output 1502 ml  Net -782 ml     Physical Exam  Gen:- Awake  Alert,  Obese, looks tired HEENT:- [place].AT, No sclera icterus Neck-Supple Neck,No JVD,.  Lungs-  CTAB , diminished in bases  CV- S1, S2 normal Abd-  +ve B.Sounds, Abd Soft, abdomen is less distended after paracentesis Extremity/Skin:-No jaundice, warm and dry Psych-affect is appropriate, oriented x3 Neuro-no new focal deficits, no tremors   Data Review:   Micro Results No results found for this or any previous visit (from the past 240 hour(s)).  Radiology Reports Dg Chest 2 View  Result Date: 12/21/2017 CLINICAL DATA:  Shortness of breath. EXAM: CHEST - 2 VIEW COMPARISON:  Radiograph of September 26, 2017. FINDINGS: Stable cardiomegaly with central pulmonary vascular congestion. Status post coronary artery bypass graft. Single lead left-sided pacemaker is unchanged in  position. Status post aortic valve replacement. No pneumothorax or pleural effusion is noted. No consolidative process is noted. Bony thorax is unremarkable. IMPRESSION: Stable cardiomegaly with central pulmonary vascular congestion. Electronically Signed   By: Marijo Conception, M.D.   On: 12/21/2017 13:21   US Paracentesis  Result Date: 12/07/2017 INDICATION: Recurrent ascites EXAM: ULTRASOUND-GUIDED PARACENTESIS COMPARISON:  Previous paracentesis. MEDICATIONS: 10 cc 1% lidocaine. COMPLICATIONS: None immediate TECHNIQUE: Informed written consent was obtained from the patient after a discussion of the risks, benefits and alternatives to treatment. A timeout was performed prior to the initiation of the procedure. Initial ultrasound scanning demonstrates a large amount of ascites within the right lower abdominal quadrant. The right lower abdomen was prepped and draped in the usual sterile fashion. 1% lidocaine with epinephrine was used for local anesthesia. Under direct ultrasound guidance, a 19 gauge, 7-cm, Yueh catheter was introduced. An ultrasound image was saved for documentation purposed. The paracentesis was performed. The catheter  was removed and a dressing was applied. The patient tolerated the procedure well without immediate post procedural complication. FINDINGS: A total of approximately 8 liters of yellow fluid was removed. IMPRESSION: Successful ultrasound-guided paracentesis yielding 8 liters of peritoneal fluid. Read by Lavonia Drafts [facility] Electronically Signed   By: Lavonia Dana M.D.   On: 12/07/2017 09:51   Ir Paracentesis  Result Date: 12/22/2017 INDICATION: Patient with recurrent ascites. Request is made for therapeutic paracentesis. EXAM: ULTRASOUND GUIDED THERAPEUTIC PARACENTESIS MEDICATIONS: 10 mL 2% lidocaine COMPLICATIONS: None immediate. PROCEDURE: Informed written consent was obtained from the patient after a discussion of the risks, benefits and alternatives to treatment. A timeout was performed prior to the initiation of the procedure. Initial ultrasound scanning demonstrates a large amount of ascites within the right lower abdominal quadrant. The right lower abdomen was prepped and draped in the usual sterile fashion. 2% lidocaine was used for local anesthesia. Following this, a 19 gauge, 7-cm, Yueh catheter was introduced. An ultrasound image was saved for documentation purposes. The paracentesis was performed. The catheter was removed and a dressing was applied. The patient tolerated the procedure well without immediate post procedural complication. FINDINGS: A total of approximately 5.5 L of yellow fluid was removed. IMPRESSION: Successful ultrasound-guided therapeutic paracentesis yielding 5.5 liters of peritoneal fluid. Read by: Brynda Greathouse PA-C Electronically Signed   By: Jacqulynn Cadet M.D.   On: 12/22/2017 17:36     CBC Recent Labs  Lab 12/19/17 0838 12/21/17 1231 12/22/17 0045 12/23/17 0958  WBC 10.0 8.2 9.0 6.4  HGB 6.7* 8.2* 7.7* 7.5*  HCT 21.7* 25.6* 24.1* 23.4*  PLT 169 150 151 149*  MCV 77.8* 80.0 79.8 79.9  MCH 24.0* 25.6* 25.5* 25.6*  MCHC 30.9* 32.0 32.0 32.1  RDW 24.8* 23.1*  23.3* 23.7*  LYMPHSABS 0.7*  --  0.7 0.6*  MONOABS 0.7  --  1.0 0.6  EOSABS 0.1  --  0.1 0.1  BASOSABS 0.1  --  0.0 0.0    Chemistries  Recent Labs  Lab 12/21/17 1231 12/22/17 0459 12/23/17 0719  NA 127* 121* 122*  K 3.5 4.3 4.7  CL 89* 87* 88*  CO2 23 20* 22  GLUCOSE 127* 127* 111*  BUN 92* 89* 90*  CREATININE 3.38* 3.45* 3.08*  CALCIUM 8.5* 8.7* 8.9   ------------------------------------------------------------------------------------------------------------------ No results for input(s): CHOL, HDL, LDLCALC, TRIG, CHOLHDL, LDLDIRECT in the last 72 hours.  Lab Results  Component Value Date   HGBA1C 5.1 12/21/2017   ------------------------------------------------------------------------------------------------------------------ No results for input(s): TSH, T4TOTAL,  T3FREE, THYROIDAB in the last 72 hours.  Invalid input(s): FREET3 ------------------------------------------------------------------------------------------------------------------ No results for input(s): VITAMINB12, FOLATE, FERRITIN, TIBC, IRON, RETICCTPCT in the last 72 hours.  Coagulation profile No results for input(s): INR, PROTIME in the last 168 hours.  No results for input(s): DDIMER in the last 72 hours.  Cardiac Enzymes No results for input(s): CKMB, TROPONINI, MYOGLOBIN in the last 168 hours.  Invalid input(s): CK ------------------------------------------------------------------------------------------------------------------    Component Value Date/Time   BNP 154.3 (H) 09/12/2016 0174     [name] M.D on 12/23/2017 at 5:43 PM  Between 7am to 7pm - Pager - [phone]  After 7pm go to [email or web address] - password [email or web address]  [facility] -  Office  [phone]   Voice Recognition Viviann Spare dictation system was used to create this note, attempts have been made to correct errors. Please contact the author with questions and/or clarifications.

## 2017-12-23 NOTE — Progress Notes (Signed)
Patient presents mildly diaphoretic but alert and oriented, MD at bedside during stat CBG and VS.   BP has mildly improved since earlier check, CBG WNL (no insulin needed).

## 2017-12-23 NOTE — Plan of Care (Signed)
°  Problem: Clinical Measurements: °Goal: Ability to maintain clinical measurements within normal limits will improve °Outcome: Progressing °  °Problem: Clinical Measurements: °Goal: Diagnostic test results will improve °Outcome: Progressing °  °

## 2017-12-24 LAB — CBC
HEMATOCRIT: 25.4 % — AB (ref 39.0–52.0)
HEMATOCRIT: 28.5 % — AB (ref 39.0–52.0)
HEMOGLOBIN: 8.1 g/dL — AB (ref 13.0–17.0)
HEMOGLOBIN: 9.2 g/dL — AB (ref 13.0–17.0)
MCH: 25.6 pg — ABNORMAL LOW (ref 26.0–34.0)
MCH: 26.3 pg (ref 26.0–34.0)
MCHC: 31.9 g/dL (ref 30.0–36.0)
MCHC: 32.3 g/dL (ref 30.0–36.0)
MCV: 80.4 fL (ref 78.0–100.0)
MCV: 81.4 fL (ref 78.0–100.0)
Platelets: 136 10*3/uL — ABNORMAL LOW (ref 150–400)
Platelets: 129 10*3/uL — ABNORMAL LOW (ref 150–400)
RBC: 3.16 MIL/uL — ABNORMAL LOW (ref 4.22–5.81)
RBC: 3.5 MIL/uL — ABNORMAL LOW (ref 4.22–5.81)
RDW: 22.6 % — AB (ref 11.5–15.5)
RDW: 22 % — AB (ref 11.5–15.5)
WBC: 7.6 10*3/uL (ref 4.0–10.5)
WBC: 7.2 10*3/uL (ref 4.0–10.5)

## 2017-12-24 LAB — BASIC METABOLIC PANEL
Anion gap: 11 (ref 5–15)
BUN: 82 mg/dL — AB (ref 6–20)
CALCIUM: 8.9 mg/dL (ref 8.9–10.3)
CO2: 22 mmol/L (ref 22–32)
Chloride: 90 mmol/L — ABNORMAL LOW (ref 101–111)
Creatinine, Ser: 2.55 mg/dL — ABNORMAL HIGH (ref 0.61–1.24)
GFR calc Af Amer: 28 mL/min — ABNORMAL LOW (ref >60)
GFR, EST NON AFRICAN AMERICAN: 24 mL/min — AB (ref >60)
GLUCOSE: 121 mg/dL — AB (ref 65–99)
POTASSIUM: 4.7 mmol/L (ref 3.5–5.1)
SODIUM: 123 mmol/L — AB (ref 135–145)

## 2017-12-24 LAB — GLUCOSE, CAPILLARY
GLUCOSE-CAPILLARY: 150 mg/dL — AB (ref 65–99)
GLUCOSE-CAPILLARY: 145 mg/dL — AB (ref 65–99)
GLUCOSE-CAPILLARY: 139 mg/dL — AB (ref 65–99)
Glucose-Capillary: 132 mg/dL — ABNORMAL HIGH (ref 65–99)

## 2017-12-24 LAB — PREPARE RBC (CROSSMATCH)

## 2017-12-24 LAB — HEMOGLOBIN AND HEMATOCRIT, BLOOD
HEMATOCRIT: 25.7 % — AB (ref 39.0–52.0)
Hemoglobin: 8.2 g/dL — ABNORMAL LOW (ref 13.0–17.0)

## 2017-12-24 MED ORDER — FUROSEMIDE 10 MG/ML IJ SOLN
20.0000 mg | Freq: Once | INTRAMUSCULAR | Status: AC
  Administered 2017-12-24: 20 mg via INTRAVENOUS
  Filled 2017-12-24: qty 2

## 2017-12-24 MED ORDER — SODIUM CHLORIDE 0.9 % IV SOLN
Freq: Once | INTRAVENOUS | Status: AC
  Administered 2017-12-24: 20 mL via INTRAVENOUS

## 2017-12-24 NOTE — Progress Notes (Signed)
Patient Demographics:    [name], is a 72 y.o. male, DOB - 1945/07/28, PZW:[id number]  Admit date - 12/21/2017   Admitting Physician Karmen Bongo, MD  Outpatient Primary MD for the patient is [name], MD  LOS - 2   Chief Complaint  Patient presents with  . Abnormal Lab  . Weakness        Subjective:    [name] today has no fevers, no emesis,  No chest pain, had BMs x 2, wife at bedside, questions answered  Assessment  & Plan :    Principal Problem:   Fatigue Active Problems:   Essential hypertension   Type 2 diabetes mellitus (HCC)   OSA (obstructive sleep apnea)   Anemia due to chronic renal failure treated with erythropoietin, stage 3 (moderate) (HCC)   Chronic diastolic CHF (congestive heart failure) (HCC)   Acute renal failure superimposed on stage 4 chronic kidney disease (HCC)   Volume overload   Palliative care by specialist   Brief Summary 71 y.o.malewith medical history significant forcoronary artery disease/status post CABG, aortic stenosis status post AVR, chronic kidney disease stage IV, cirrhosis with ascites, atrial fibrillation status post pacemaker not on anticoagulation, chronic diastolic CHF, insulin-dependent diabetes mellitus, and 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  Had paracentesis on 12/22/17 with 5.5 L of fluid removed.  Patient was transfused 1 units of packed cells on 12/23/2017 and again 1 unit on 12/24/2017    Plan:- 1)Recurrent Ascites in the setting of liver cirrhosis--patient has paracentesis from time to time, patient feels better after therapeutic/palliative paracentesis on 12/22/17 with 5.5 L of fluid removed  2)Recurrent anemia/Gi Bleed/AVMs-patient with chronic anemia  of chronic blood loss compounded by anemia of CKD, outpatient small bowel enteroscopy from 11/01/2017 at [facility] by Dr Brandon Melnick, [facility]aris showed 4 AVMs that were cauterized, patient has been requiring transfusions since (8 units of packed cells transfused over the last 6 weeks),  As per Dr Lebron Conners (Hematology) transfuse to keep his hemoglobin above 8.5, so  Patient was transfused 1 units of packed cells on 12/23/2017 and again 1 unit on 12/24/2017.  CKD probably contributory to his anemia, if H&H drops patient may need repeat small bowel enteroscopy, discussed with Dr. Henrene Pastor from [facility], small bowel enteroscopy cannot be performed in [facility], patient will have to go back to [facility] or [facility]  3)Fatigue/OSA-patient has not been compliant with CPAP, anemia and liver cirrhosis as well as heart failure probably contributing to his fatigue  4)HFpEF-last known EF 60 to 65%, patient with history of chronic grade 2 diastolic dysfunction CHF, watch volume status closely , Lasix as ordered  5)AKI----improving AKI, acute kidney injury on CKD stage - IV     creatinine on admission= 3.38 ,   baseline creatinine =   2.3  , creatinine is now= 2.55     ,  Avoid nephrotoxic agents/dehydration/hypotension, continue Lasix 40 mg every 12 hours watch renal function and electrolytes closely, metolazone starting on Sunday 12/24/17 at 5 mg every other day  6)HTN-stopped metoprolol,c/n atenolol given liver cirrhosis with possible portal hypertension   7)DM-last  A1c is 5.1, patient is at risk for hypoglycemia, decrease NPH to 60 units twice daily from 65 units, Use Novolog/Humalog Sliding scale insulin with Accu-Cheks/Fingersticks as ordered   8)Social/Ethics--patient is a DNR/DNI, palliative care consult appreciated, patient would like to continue to attempt control of his GI bleed with GI procedures  9)Dispo-PT and OT recommending skilled nursing facility placement, patient, wife and daughter are agreeable, social work  consult to help with skilled nursing facility placement requested  DVT prophylaxis:  scd/TEDs Code Status: DNR - confirmed with patient/family Family Communication: Wife  Disposition Plan:  SNF Consults called: CM, palliative care, nutrition, PT   Lab Results  Component Value Date   PLT 136 (L) 12/24/2017    Inpatient Medications  Scheduled Meds: . allopurinol  100 mg Oral Daily  . atenolol  12.5 mg Oral BID  . atorvastatin  20 mg Oral q1800  . colchicine  0.6 mg Oral BID  . furosemide  20 mg Intravenous Q12H  . gabapentin  300 mg Oral QID  . insulin aspart  0-5 Units Subcutaneous QHS  . insulin aspart  0-9 Units Subcutaneous TID WC  . insulin NPH Human  60 Units Subcutaneous QHS  . insulin NPH Human  60 Units Subcutaneous QAC breakfast  . metolazone  5 mg Oral QODAY  . pantoprazole  40 mg Oral Daily  . potassium chloride SA  40 mEq Oral BID  . protein supplement shake  11 oz Oral BID BM  . sertraline  50 mg Oral Daily  . sodium chloride flush  3 mL Intravenous Q12H  . tamsulosin  0.4 mg Oral QPC supper   Continuous Infusions: . sodium chloride    . sodium chloride     PRN Meds:.sodium chloride, acetaminophen **OR** acetaminophen, calcium carbonate (dosed in mg elemental calcium), camphor-menthol **AND** hydrOXYzine, ondansetron **OR** ondansetron (ZOFRAN) IV, sodium chloride flush, sorbitol, zolpidem    Anti-infectives (From admission, onward)   None        Objective:   Vitals:   12/24/17 1018 12/24/17 1232 12/24/17 1302 12/24/17 1600  BP: (!) 95/54 (!) 92/56 (!) 98/55 (!) 108/57  Pulse: 74 75 75 75  Resp:  18 20 20   Temp:  (!) 97.4 F (36.3 C) 97.6 F (36.4 C) 97.7 F (36.5 C)  TempSrc:  Oral Oral Oral  SpO2:  100% 96% 99%  Weight:      Height:        Wt Readings from Last 3 Encounters:  12/24/17 111.1 kg (245 lb)  12/05/17 122.1 kg (269 lb 1.9 oz)  11/21/17 122 kg (268 lb 14.4 oz)     Intake/Output Summary (Last 24 hours) at 12/24/2017  1633 Last data filed at 12/24/2017 1616 Gross per 24 hour  Intake 2556.92 ml  Output 1501 ml  Net 1055.92 ml     Physical Exam  Gen:- Awake Alert,  Obese, no acute distress  HEENT:- [place].AT, No sclera icterus Neck-Supple Neck,No JVD,.  Lungs-  CTAB , diminished in bases  CV- S1, S2 normal Abd-  +ve B.Sounds, Abd Soft, abdomen is much less distended after paracentesis Extremity/Skin:-No jaundice, warm and dry Psych-affect is appropriate, oriented x3 Neuro-no new focal deficits, no tremors   Data Review:   Micro Results No results found for this or any previous visit (from the past 240 hour(s)).  Radiology Reports Dg Chest 2 View  Result Date: 12/21/2017 CLINICAL DATA:  Shortness of breath. EXAM: CHEST - 2 VIEW COMPARISON:  Radiograph of September 26, 2017. FINDINGS:  Stable cardiomegaly with central pulmonary vascular congestion. Status post coronary artery bypass graft. Single lead left-sided pacemaker is unchanged in position. Status post aortic valve replacement. No pneumothorax or pleural effusion is noted. No consolidative process is noted. Bony thorax is unremarkable. IMPRESSION: Stable cardiomegaly with central pulmonary vascular congestion. Electronically Signed   By: Marijo Conception, M.D.   On: 12/21/2017 13:21   US Paracentesis  Result Date: 12/07/2017 INDICATION: Recurrent ascites EXAM: ULTRASOUND-GUIDED PARACENTESIS COMPARISON:  Previous paracentesis. MEDICATIONS: 10 cc 1% lidocaine. COMPLICATIONS: None immediate TECHNIQUE: Informed written consent was obtained from the patient after a discussion of the risks, benefits and alternatives to treatment. A timeout was performed prior to the initiation of the procedure. Initial ultrasound scanning demonstrates a large amount of ascites within the right lower abdominal quadrant. The right lower abdomen was prepped and draped in the usual sterile fashion. 1% lidocaine with epinephrine was used for local anesthesia. Under direct ultrasound  guidance, a 19 gauge, 7-cm, Yueh catheter was introduced. An ultrasound image was saved for documentation purposed. The paracentesis was performed. The catheter was removed and a dressing was applied. The patient tolerated the procedure well without immediate post procedural complication. FINDINGS: A total of approximately 8 liters of yellow fluid was removed. IMPRESSION: Successful ultrasound-guided paracentesis yielding 8 liters of peritoneal fluid. Read by Lavonia Drafts [facility] Electronically Signed   By: Lavonia Dana M.D.   On: 12/07/2017 09:51   Ir Paracentesis  Result Date: 12/22/2017 INDICATION: Patient with recurrent ascites. Request is made for therapeutic paracentesis. EXAM: ULTRASOUND GUIDED THERAPEUTIC PARACENTESIS MEDICATIONS: 10 mL 2% lidocaine COMPLICATIONS: None immediate. PROCEDURE: Informed written consent was obtained from the patient after a discussion of the risks, benefits and alternatives to treatment. A timeout was performed prior to the initiation of the procedure. Initial ultrasound scanning demonstrates a large amount of ascites within the right lower abdominal quadrant. The right lower abdomen was prepped and draped in the usual sterile fashion. 2% lidocaine was used for local anesthesia. Following this, a 19 gauge, 7-cm, Yueh catheter was introduced. An ultrasound image was saved for documentation purposes. The paracentesis was performed. The catheter was removed and a dressing was applied. The patient tolerated the procedure well without immediate post procedural complication. FINDINGS: A total of approximately 5.5 L of yellow fluid was removed. IMPRESSION: Successful ultrasound-guided therapeutic paracentesis yielding 5.5 liters of peritoneal fluid. Read by: Brynda Greathouse PA-C Electronically Signed   By: Jacqulynn Cadet M.D.   On: 12/22/2017 17:36     CBC Recent Labs  Lab 12/19/17 7989 12/21/17 1231 12/22/17 0045 12/23/17 0958 12/24/17 0038 12/24/17 0852  WBC 10.0 8.2  9.0 6.4  --  7.6  HGB 6.7* 8.2* 7.7* 7.5* 8.2* 8.1*  HCT 21.7* 25.6* 24.1* 23.4* 25.7* 25.4*  PLT 169 150 151 149*  --  136*  MCV 77.8* 80.0 79.8 79.9  --  80.4  MCH 24.0* 25.6* 25.5* 25.6*  --  25.6*  MCHC 30.9* 32.0 32.0 32.1  --  31.9  RDW 24.8* 23.1* 23.3* 23.7*  --  22.6*  LYMPHSABS 0.7*  --  0.7 0.6*  --   --   MONOABS 0.7  --  1.0 0.6  --   --   EOSABS 0.1  --  0.1 0.1  --   --   BASOSABS 0.1  --  0.0 0.0  --   --     Chemistries  Recent Labs  Lab 12/21/17 1231 12/22/17 0459 12/23/17 0719 12/24/17 0038  NA 127* 121* 122* 123*  K 3.5 4.3 4.7 4.7  CL 89* 87* 88* 90*  CO2 23 20* 22 22  GLUCOSE 127* 127* 111* 121*  BUN 92* 89* 90* 82*  CREATININE 3.38* 3.45* 3.08* 2.55*  CALCIUM 8.5* 8.7* 8.9 8.9   ------------------------------------------------------------------------------------------------------------------ No results for input(s): CHOL, HDL, LDLCALC, TRIG, CHOLHDL, LDLDIRECT in the last 72 hours.  Lab Results  Component Value Date   HGBA1C 5.1 12/21/2017   ------------------------------------------------------------------------------------------------------------------ No results for input(s): TSH, T4TOTAL, T3FREE, THYROIDAB in the last 72 hours.  Invalid input(s): FREET3 ------------------------------------------------------------------------------------------------------------------ No results for input(s): VITAMINB12, FOLATE, FERRITIN, TIBC, IRON, RETICCTPCT in the last 72 hours.  Coagulation profile No results for input(s): INR, PROTIME in the last 168 hours.  No results for input(s): DDIMER in the last 72 hours.  Cardiac Enzymes No results for input(s): CKMB, TROPONINI, MYOGLOBIN in the last 168 hours.  Invalid input(s): CK ------------------------------------------------------------------------------------------------------------------    Component Value Date/Time   BNP 154.3 (H) 09/12/2016 1601     [name] M.D on 12/24/2017 at 4:33  PM  Between 7am to 7pm - Pager - [phone]  After 7pm go to [email or web address] - password [email or web address]  [facility] -  Office  [phone]   Voice Recognition Viviann Spare dictation system was used to create this note, attempts have been made to correct errors. Please contact the author with questions and/or clarifications.

## 2017-12-24 NOTE — Progress Notes (Signed)
Patient resting comfortably during shift report. Denies complaints.  

## 2017-12-24 NOTE — Evaluation (Signed)
Occupational Therapy Evaluation Patient Details Name: [name] MRN: [id number] DOB: July 17, 1946 Today's Date: 12/24/2017    History of Present Illness [name] is a 72 y.o. male with medical history significant of pacemaker; thrombocytopenia; OSA on CPAP; HTN; HLD; DM; COPD; CKD and associated anemia; cirrhosis; chronic diastolic heart failure (grade 2 diastolic dysfunction); CAD; and afib off AC due to h/o GI bleeds presenting with "I just don't have any energy".  It has been slowly progressing for about a month.  It finally got to the point this AM that he couldn't even get off the commode by himself.  He usually feels much better after blood transfusion, but he received 2 units Tuesday and it didn't make any improvement. He is chronically SOB, possibly just slightly worse.  No cough.  Mild swelling in feet/ankles.  He is just extremely weak, "just sick", can't walk or get up and down.   Clinical Impression   PTA Pt mod A for bathing/dressing from wife. Pt ambulated Household distances with RW, and performed toilet transfers and peri care mod I. Pt is currently max A for LB ADL, min A for transfers and max A for toilet care needs. Pt presents overall with generalized deconditioning, decreased activity tolerance, decreased balance for ADL and transfers. It should be noted that the Pt is doing much better than PT session 2 days ago, however at this time he will require skilled OT in the acute setting as well as post-acute SNF level therapies to maximize safety and independence in ADL and functional transfers as well as build up activity tolerance for meaningful occupations. I personally also believe that caregiver (wife) will benefit from education on how to care for her husband while protecting her own biomechanical integrity and different compensatory strategies/adaptations that will allow her to continue to assist her husband while keeping her safe as well. OT will continue to follow  acutely. Next session to focus on building activity tolerance and toilet transfer safety/independence.    Follow Up Recommendations  SNF;Supervision/Assistance - 24 hour    Equipment Recommendations  Other (comment)(Potentially a Rollator that can fit through doors)    Recommendations for Other Services       Precautions / Restrictions Precautions Precautions: Fall Restrictions Weight Bearing Restrictions: No      Mobility Bed Mobility Overal bed mobility: Needs Assistance Bed Mobility: Supine to Sit;Sit to Supine     Supine to sit: Min assist;HOB elevated Sit to supine: Min guard;HOB elevated   General bed mobility comments: use of bed rails, min guard assist throughout for safety  Transfers Overall transfer level: Needs assistance Equipment used: Rolling walker (2 wheeled) Transfers: Sit to/from Stand Sit to Stand: Min assist;+2 safety/equipment;From elevated surface         General transfer comment: heavy reliance on RW for support, and min A for boost    Balance Overall balance assessment: Needs assistance Sitting-balance support: Bilateral upper extremity supported Sitting balance-Leahy Scale: Fair     Standing balance support: Bilateral upper extremity supported;During functional activity Standing balance-Leahy Scale: Poor Standing balance comment: reliant on RW for support and balance                           ADL either performed or assessed with clinical judgement   ADL Overall ADL's : Needs assistance/impaired Eating/Feeding: Modified independent   Grooming: Set up;Sitting   Upper Body Bathing: Moderate assistance   Lower Body Bathing: Maximal assistance  Upper Body Dressing : Set up   Lower Body Dressing: Maximal assistance;Sit to/from stand Lower Body Dressing Details (indicate cue type and reason): RW for stability with transfer Toilet Transfer: Minimal assistance;Ambulation;RW;BSC Toilet Transfer Details (indicate cue type  and reason): short ambulation, vc for safety Toileting- Clothing Manipulation and Hygiene: Maximal assistance;Sit to/from stand       Functional mobility during ADLs: Minimal assistance;Rolling walker General ADL Comments: generalized deconditioning, fatgiue, decreased activity tolerance     Vision         Perception     Praxis      Pertinent Vitals/Pain Pain Assessment: Faces Faces Pain Scale: Hurts little more Pain Location: abdominal region Pain Descriptors / Indicators: Grimacing Pain Intervention(s): Monitored during session;Repositioned;Limited activity within patient's tolerance     Hand Dominance     Extremity/Trunk Assessment Upper Extremity Assessment Upper Extremity Assessment: Generalized weakness   Lower Extremity Assessment Lower Extremity Assessment: Generalized weakness   Cervical / Trunk Assessment Cervical / Trunk Assessment: Other exceptions Cervical / Trunk Exceptions: large abdominal region   Communication Communication Communication: No difficulties   Cognition Arousal/Alertness: Awake/alert Behavior During Therapy: Flat affect Overall Cognitive Status: Within Functional Limits for tasks assessed                                     General Comments       Exercises     Shoulder Instructions      Home Living Family/patient expects to be discharged to:: Skilled nursing facility Living Arrangements: Spouse/significant other Available Help at Discharge: Family;Available 24 hours/day(unable to physically assist with transfers) Type of Home: House Home Access: Level entry(threshold)     Home Layout: One level(step up into master Bedroom)     Bathroom Shower/Tub: Tub/shower unit   Bathroom Toilet: Handicapped height Bathroom Accessibility: No   Home Equipment: Environmental consultant - 2 wheels;Walker - 4 wheels;Walker - standard;Bedside commode;Shower seat;Hand held shower head(wide rollator - will not fit through doors)    Additional Comments: does not have accessibility with wc to house - doorframes too small      Prior Functioning/Environment Level of Independence: Needs assistance  Gait / Transfers Assistance Needed: limited to hosuehold ambulation over last 6 months, was walking in home with RW a month ago but has gotten weaker, then last tuesday 5/27 after blood tranfusion,  has felt even more and more weakl ADL's / Homemaking Assistance Needed: wife assists with all bathing/dressing and does all IADL   Comments: Wife uses cane for mobility and cannot physcially assist with transfers at this time        OT Problem List: Decreased strength;Decreased activity tolerance;Impaired balance (sitting and/or standing);Decreased safety awareness;Obesity      OT Treatment/Interventions: Self-care/ADL training;Therapeutic exercise;Energy conservation;DME and/or AE instruction;Therapeutic activities;Patient/family education;Balance training    OT Goals(Current goals can be found in the care plan section) Acute Rehab OT Goals Patient Stated Goal: return home OT Goal Formulation: With patient/family Time For Goal Achievement: 01/07/18 Potential to Achieve Goals: Good ADL Goals Pt Will Transfer to Toilet: with modified independence;ambulating Pt Will Perform Toileting - Clothing Manipulation and hygiene: with modified independence;sit to/from stand Additional ADL Goal #1: Pt will perform bed mobility independently prior to engaging in ADL Additional ADL Goal #2: Pt will recall 3 ways of conserving energy during ADL with 1 or less verbal cues  OT Frequency: Min 2X/week   Barriers to D/C: Other (comment)  Pt's wife is physically unable to assist with transfers       Co-evaluation              AM-PAC PT "6 Clicks" Daily Activity     Outcome Measure Help from another person eating meals?: None Help from another person taking care of personal grooming?: A Little Help from another person toileting, which  includes using toliet, bedpan, or urinal?: A Lot Help from another person bathing (including washing, rinsing, drying)?: A Lot Help from another person to put on and taking off regular upper body clothing?: A Little Help from another person to put on and taking off regular lower body clothing?: A Lot 6 Click Score: 16   End of Session Equipment Utilized During Treatment: Gait belt;Rolling walker Nurse Communication: Mobility status;Precautions;Other (comment)(need for SNF)  Activity Tolerance: Patient limited by fatigue Patient left: in bed;with call bell/phone within reach;with nursing/sitter in room;with family/visitor present  OT Visit Diagnosis: Unsteadiness on feet (R26.81);Other abnormalities of gait and mobility (R26.89);Muscle weakness (generalized) (M62.81);Adult, failure to thrive (R62.7)                Time: 1200-1228 OT Time Calculation (min): 28 min Charges:  OT General Charges $OT Visit: 1 Visit OT Evaluation $OT Eval Moderate Complexity: 1 Mod OT Treatments $Self Care/Home Management : 8-22 mins G-Codes:     Hulda Humphrey OTR/L [place] 12/24/2017, 1:35 PM

## 2017-12-25 DIAGNOSIS — Z9889 Other specified postprocedural states: Secondary | ICD-10-CM

## 2017-12-25 DIAGNOSIS — K7031 Alcoholic cirrhosis of liver with ascites: Secondary | ICD-10-CM

## 2017-12-25 DIAGNOSIS — R1084 Generalized abdominal pain: Secondary | ICD-10-CM

## 2017-12-25 DIAGNOSIS — E8770 Fluid overload, unspecified: Secondary | ICD-10-CM

## 2017-12-25 DIAGNOSIS — R109 Unspecified abdominal pain: Secondary | ICD-10-CM

## 2017-12-25 DIAGNOSIS — D500 Iron deficiency anemia secondary to blood loss (chronic): Secondary | ICD-10-CM

## 2017-12-25 LAB — CBC
HCT: 27.8 % — ABNORMAL LOW (ref 39.0–52.0)
HEMOGLOBIN: 9.2 g/dL — AB (ref 13.0–17.0)
MCH: 26.7 pg (ref 26.0–34.0)
MCHC: 33.1 g/dL (ref 30.0–36.0)
MCV: 80.6 fL (ref 78.0–100.0)
Platelets: 119 10*3/uL — ABNORMAL LOW (ref 150–400)
RBC: 3.45 MIL/uL — AB (ref 4.22–5.81)
RDW: 22 % — ABNORMAL HIGH (ref 11.5–15.5)
WBC: 7.6 10*3/uL (ref 4.0–10.5)

## 2017-12-25 LAB — TYPE AND SCREEN
ABO/RH(D): A POS
ANTIBODY SCREEN: POSITIVE
DONOR AG TYPE: NEGATIVE
Donor AG Type: NEGATIVE
UNIT DIVISION: 0
UNIT DIVISION: 0

## 2017-12-25 LAB — BASIC METABOLIC PANEL
ANION GAP: 12 (ref 5–15)
BUN: 74 mg/dL — ABNORMAL HIGH (ref 6–20)
CO2: 20 mmol/L — ABNORMAL LOW (ref 22–32)
Calcium: 8.9 mg/dL (ref 8.9–10.3)
Chloride: 91 mmol/L — ABNORMAL LOW (ref 101–111)
Creatinine, Ser: 2.07 mg/dL — ABNORMAL HIGH (ref 0.61–1.24)
GFR, EST AFRICAN AMERICAN: 36 mL/min — AB (ref >60)
GFR, EST NON AFRICAN AMERICAN: 31 mL/min — AB (ref >60)
Glucose, Bld: 114 mg/dL — ABNORMAL HIGH (ref 65–99)
POTASSIUM: 4.1 mmol/L (ref 3.5–5.1)
SODIUM: 123 mmol/L — AB (ref 135–145)

## 2017-12-25 LAB — BPAM RBC
BLOOD PRODUCT EXPIRATION DATE: 201906122359
Blood Product Expiration Date: 201906152359
ISSUE DATE / TIME: 201906011951
ISSUE DATE / TIME: 201906021240
UNIT TYPE AND RH: 600
Unit Type and Rh: 6200

## 2017-12-25 LAB — GLUCOSE, CAPILLARY
GLUCOSE-CAPILLARY: 107 mg/dL — AB (ref 65–99)
GLUCOSE-CAPILLARY: 122 mg/dL — AB (ref 65–99)
Glucose-Capillary: 188 mg/dL — ABNORMAL HIGH (ref 65–99)
Glucose-Capillary: 136 mg/dL — ABNORMAL HIGH (ref 65–99)

## 2017-12-25 MED ORDER — POTASSIUM CHLORIDE CRYS ER 20 MEQ PO TBCR
20.0000 meq | EXTENDED_RELEASE_TABLET | Freq: Every day | ORAL | Status: DC
  Administered 2017-12-26: 20 meq via ORAL
  Filled 2017-12-25: qty 1

## 2017-12-25 MED ORDER — TORSEMIDE 20 MG PO TABS
120.0000 mg | ORAL_TABLET | Freq: Two times a day (BID) | ORAL | Status: DC
  Filled 2017-12-25 (×2): qty 6

## 2017-12-25 MED ORDER — ADULT MULTIVITAMIN W/MINERALS CH
1.0000 | ORAL_TABLET | Freq: Every day | ORAL | Status: DC
  Administered 2017-12-25 – 2017-12-26 (×2): 1 via ORAL
  Filled 2017-12-25 (×2): qty 1

## 2017-12-25 NOTE — NC FL2 (Signed)
[place] LEVEL OF CARE SCREENING TOOL     IDENTIFICATION  Patient Name: [name] Birthdate: 03-22-1946 Sex: male Admission Date (Current Location): 12/21/2017  [facility] and [place] Number:  Herbalist and Address:  [facility], [place] [address], [place], [place] [address]      Provider Number: [id number]  Attending Physician Name and Address:  Roney Jaffe, MD  Relative Name and Phone Number:  Artin Mceuen, [phone]    Current Level of Care: Hospital Recommended Level of Care: [place] Prior Approval Number:    Date Approved/Denied:   PASRR Number: [id number]  Discharge Plan: SNF    Current Diagnoses: Patient Active Problem List   Diagnosis Date Noted  . Abdominal pain   . Status post abdominal paracentesis   . Palliative care by specialist   . Volume overload 12/21/2017  . Fatigue 12/21/2017  . Symptomatic anemia 09/07/2017  . COPD (chronic obstructive pulmonary disease) ([place]) 09/07/2017  . Gout 09/07/2017  . Palliative care encounter   . Goals of care, counseling/discussion   . Encounter for hospice care discussion   . Elevated troponin   . Acute blood loss anemia 08/24/2017  . Upper GI bleed 08/24/2017  . Cirrhosis of liver with ascites ([place]) 08/24/2017  . Chronic diastolic CHF (congestive heart failure) ([place]) 08/24/2017  . Acute renal failure superimposed on stage 4 chronic kidney disease ([place]) 08/24/2017  . Hyponatremia 08/24/2017  . Gastrointestinal hemorrhage   . Anemia due to chronic renal failure treated with erythropoietin, stage 3 (moderate) ([place]) 04/25/2017  . Blood in stool 10/20/2016  . Polydipsia 11/11/2015  . Acute on chronic diastolic CHF (congestive heart failure) ([place]) 03/19/2015  . OSA (obstructive sleep apnea) 09/23/2014  . S/P AV nodal ablation 04/18/14 04/19/2014  . Aortic stenosis, severe with S/P AVR  04/19/2014  . Atrioventricular block, complete ([place])  04/18/2014  . CKD (chronic kidney disease), stage IV ([place]) 11/05/2013  . Unspecified gastritis and gastroduodenitis without mention of hemorrhage 07/31/2013  . Cough 06/28/2012  . Dyspnea 06/27/2012  . Permanent atrial fibrillation ([place]) 03/02/2012  . History of GI bleed 03/02/2012  . GERD (gastroesophageal reflux disease) 03/02/2012  . Type 2 diabetes mellitus ([place]) 03/02/2012  . Cirrhosis ([place])   . Mediastinal adenopathy 09/22/2011  . Osteoarthritis   . Thrombocytopenia ([place])   . Gynecomastia 09/20/2011  . Ascites 09/20/2011  . Iron deficiency anemia   . Hyperlipidemia   . Essential hypertension   . CHF (congestive heart failure) ([place])   . 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 Carotid bruit 06/14/2011  . ANEMIA 05/17/2010  . Overweight 05/02/2009  . CAD, ARTERY BYPASS GRAFT 05/02/2009  . 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    Orientation RESPIRATION BLADDER Height & Weight        Normal Continent Weight: 244 lb 12.8 oz (111 kg) Height:  5\' 8"  (172.7 cm)  BEHAVIORAL SYMPTOMS/MOOD NEUROLOGICAL BOWEL NUTRITION STATUS      Continent Diet(heart healthy/carb modified)  AMBULATORY STATUS COMMUNICATION OF NEEDS Skin   Limited Assist Verbally                         Personal Care Assistance Level of Assistance  Bathing, Feeding, Dressing Bathing Assistance: Limited assistance Feeding assistance: Independent Dressing Assistance: Limited assistance     Functional Limitations Info  Sight, Hearing, Speech Sight Info: Adequate Hearing Info: Adequate Speech Info: Adequate    SPECIAL CARE FACTORS FREQUENCY  PT (By licensed PT), OT (By licensed OT)     PT Frequency: 5x wk OT Frequency: 5x wk            Contractures Contractures Info: Not present    Additional Factors Info  Code Status, Allergies Code Status Info: DNR Allergies Info: CODEINE, NIACIN, ASPIRIN, DILTIAZEM HCL           Current Medications (12/25/2017):  This is the current hospital active  medication list Current Facility-Administered Medications  Medication Dose Route Frequency Provider Last Rate Last Dose  . 0.9 %  sodium chloride infusion  250 mL Intravenous PRN Karmen Bongo, MD      . 0.9 %  sodium chloride infusion   Intravenous Once Emokpae, Courage, MD      . acetaminophen (TYLENOL) tablet 650 mg  650 mg Oral Q6H PRN Karmen Bongo, MD   650 mg at 12/23/17 2059   Or  . acetaminophen (TYLENOL) suppository 650 mg  650 mg Rectal Q6H PRN Karmen Bongo, MD      . allopurinol (ZYLOPRIM) tablet 100 mg  100 mg Oral Daily Karmen Bongo, MD   100 mg at 12/25/17 0843  . atenolol (TENORMIN) tablet 12.5 mg  12.5 mg Oral BID [place] Brick, Courage, MD   12.5 mg at 12/25/17 0843  . atorvastatin (LIPITOR) tablet 20 mg  20 mg Oral q1800 Karmen Bongo, MD   20 mg at 12/25/17 1759  . calcium carbonate (dosed in mg elemental calcium) suspension 500 mg of elemental calcium  500 mg of elemental calcium Oral Q6H PRN Karmen Bongo, MD      . camphor-menthol [facility]) lotion 1 application  1 application Topical Z6X PRN Karmen Bongo, MD       And  . hydrOXYzine (ATARAX/VISTARIL) tablet 25 mg  25 mg Oral Q8H PRN Karmen Bongo, MD      . colchicine tablet 0.6 mg  0.6 mg Oral BID Karmen Bongo, MD   0.6 mg at 12/25/17 0843  . gabapentin (NEURONTIN) capsule 300 mg  300 mg Oral QID Karmen Bongo, MD   300 mg at 12/25/17 1759  . insulin aspart (novoLOG) injection 0-5 Units  0-5 Units Subcutaneous QHS Karmen Bongo, MD      . insulin aspart (novoLOG) injection 0-9 Units  0-9 Units Subcutaneous TID WC Karmen Bongo, MD   1 Units at 12/25/17 1802  . insulin NPH Human (HUMULIN N,NOVOLIN N) injection 60 Units  60 Units Subcutaneous QHS [name], MD   60 Units at 12/24/17 2226  . insulin NPH Human (HUMULIN N,NOVOLIN N) injection 60 Units  60 Units Subcutaneous QAC breakfast [name], MD   60 Units at 12/25/17 0844  . metolazone (ZAROXOLYN) tablet 5 mg  5 mg Oral Cathlean Sauer, MD   5 mg at 12/24/17 1018  . multivitamin with minerals tablet 1 tablet  1 tablet Oral Daily Roney Jaffe, MD   1 tablet at 12/25/17 1759  . ondansetron (ZOFRAN) tablet 4 mg  4 mg Oral Q6H PRN Karmen Bongo, MD   4 mg at 12/25/17 1037   Or  . ondansetron (ZOFRAN) injection 4 mg  4 mg Intravenous Q6H PRN Karmen Bongo, MD   4 mg at 12/25/17 0228  . pantoprazole (PROTONIX) EC tablet 40 mg  40 mg Oral Daily Karmen Bongo, MD   40 mg at 12/25/17 0843  . [START ON 12/26/2017] potassium chloride SA (K-DUR,KLOR-CON) CR tablet 20 mEq  20 mEq Oral Daily Roney Jaffe, MD      .  protein supplement (PREMIER PROTEIN) liquid  11 oz Oral BID BM Emokpae, Courage, MD   11 oz at 12/25/17 1150  . sertraline (ZOLOFT) tablet 50 mg  50 mg Oral Daily Karmen Bongo, MD   50 mg at 12/25/17 0843  . sodium chloride flush (NS) 0.9 % injection 3 mL  3 mL Intravenous Q12H Karmen Bongo, MD   3 mL at 12/25/17 0845  . sodium chloride flush (NS) 0.9 % injection 3 mL  3 mL Intravenous PRN Karmen Bongo, MD      . sorbitol 70 % solution 30 mL  30 mL Oral PRN Karmen Bongo, MD   30 mL at 12/25/17 1806  . tamsulosin (FLOMAX) capsule 0.4 mg  0.4 mg Oral QPC supper Karmen Bongo, MD   0.4 mg at 12/25/17 1759  . torsemide (DEMADEX) tablet 120 mg  120 mg Oral BID Roney Jaffe, MD      . zolpidem [facility]) tablet 5 mg  5 mg Oral QHS PRN Karmen Bongo, MD   5 mg at 12/22/17 0006     Discharge Medications: Please see discharge summary for a list of discharge medications.  Relevant Imaging Results:  Relevant Lab Results:   Additional Information SS# [id number]  Wende Neighbors, LCSW

## 2017-12-25 NOTE — Progress Notes (Signed)
Nutrition Follow-up  DOCUMENTATION CODES:   Obesity unspecified  INTERVENTION:   -Continue Premier Protein BID, each supplement provides 160 kcals and 30 grams protein -MVI with minerals daily  NUTRITION DIAGNOSIS:   Increased nutrient needs related to chronic illness(COPD, cirrhosis, CHF) as evidenced by estimated needs.  Ongoing  GOAL:   Patient will meet greater than or equal to 90% of their needs  Progressing  MONITOR:   PO intake, Supplement acceptance, Labs, Weight trends, I & O's, Skin  REASON FOR ASSESSMENT:   Consult (heart failure)  ASSESSMENT:   72 y.o. male with medical history significant of pacemaker; thrombocytopenia; OSA on CPAP; HTN; HLD; DM; COPD; CKD and associated anemia; cirrhosis; chronic diastolic heart failure (grade 2 diastolic dysfunction); CAD; and afib off AC due to h/o GI bleeds presenting with weakness and fatigue.  Reviewed I/O's: -1.5 L  X 24 houts and -2.1 L since admission.   Pt very lethargic at time of visit; unable to awaken enough to answer questions. Hx obtained from pt wife at bedside, who reports a general decline in health noticeably over the past week. She shares that pt generally consumes 3 meals per day (Breakfast: omelette with hash browns, Lunch: frozen dinner, Dinner: [employer], corn, and cabbage). Over the past week, intake has been increasingly less- pt has mostly been drinking fluids, such as coffee, water, and mountain Dew. Pt wife shares that she became concern when pt became much more weak about a week ago and needed a lot more assistance around the house. Pt are a good breakfast today; noted meal completion 75-100%. Pt is consuming about half of the Premier Protein supplements when offered.   Pt wife endorses wt loss, however, unsure of UBW. Wt hx difficult to assess due to edema and repeated paracentesis. Also suspect edema may be masking true wt loss and muscle depletion. Pt at high risk for malnutrition, however,  unable to identify at this time.   Discussed importance of good meal and supplement intake to promote healing. Pt wife amenable to continue Premier Protein supplements and expressed desire to continue when pt discharges from the hospital.   Last Hgb A1c: 5.1 (12/21/17). PTA DM medications are 65 units insulin NPH BID and 30 units insulin regular BID.   Labs reviewed: Na: 123, CBGSL 107-139 (inpatient orders for glycemic control are 0-5 units insulin aspart q HS, 0-9 units insulin aspart TID with meals, 60 units insulin NPO q AM, and 60 units insulin NPO 60 units q HS).   NUTRITION - FOCUSED PHYSICAL EXAM:    Most Recent Value  Orbital Region  Mild depletion  Upper Arm Region  No depletion  Thoracic and Lumbar Region  No depletion  Buccal Region  Mild depletion  Temple Region  Mild depletion  Clavicle Bone Region  No depletion  Clavicle and Acromion Bone Region  No depletion  Scapular Bone Region  No depletion  Dorsal Hand  No depletion  Patellar Region  No depletion  Anterior Thigh Region  No depletion  Posterior Calf Region  No depletion  Edema (RD Assessment)  Moderate  Hair  Reviewed  Eyes  Reviewed  Mouth  Reviewed  Skin  Reviewed  Nails  Reviewed       Diet Order:   Diet Order           Diet heart healthy/carb modified Room service appropriate? Yes; Fluid consistency: Thin  Diet effective now          EDUCATION NEEDS:  Not appropriate for education at this time  Skin:  Skin Assessment: Reviewed RN Assessment  Last BM:  12/24/17  Height:   Ht Readings from Last 1 Encounters:  12/21/17 5\' 8"  (1.727 m)    Weight:   Wt Readings from Last 1 Encounters:  12/25/17 244 lb 12.8 oz (111 kg)    Ideal Body Weight:  70 kg  BMI:  Body mass index is 37.22 kg/m.  Estimated Nutritional Needs:   Kcal:  2100-2300  Protein:  115-135 grams  Fluid:  Per MD    [name], RD, LDN, CDE Pager: [phone] After hours Pager: [phone]

## 2017-12-25 NOTE — Progress Notes (Signed)
Patient Demographics:    [name], is a 72 y.o. male, DOB - 07/05/1946, JKK:[id number]  Admit date - 12/21/2017   Admitting Physician Karmen Bongo, MD  Outpatient Primary MD for the patient is [name], MD  LOS - 3   Chief Complaint  Patient presents with  . Abnormal Lab  . Weakness        Subjective:    [name] has gen'd weakness, walked with PT today in the halls     Assessment  & Plan :    Principal Problem:   Fatigue Active Problems:   Essential hypertension   Type 2 diabetes mellitus (HCC)   OSA (obstructive sleep apnea)   Anemia due to chronic renal failure treated with erythropoietin, stage 3 (moderate) (HCC)   Chronic diastolic CHF (congestive heart failure) (HCC)   Acute renal failure superimposed on stage 4 chronic kidney disease (HCC)   Volume overload   Palliative care by specialist   Brief Summary 71 y.o.malewith medical history significant forcoronary artery disease/status post CABG, aortic stenosis status post AVR, chronic kidney disease stage IV, cirrhosis with ascites, atrial fibrillation status post pacemaker not on anticoagulation, chronic diastolic CHF, insulin-dependent diabetes mellitus, and 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  Had paracentesis on 12/22/17 with 5.5 L of fluid removed.  Patient was transfused 1 units of packed cells on 12/23/2017 and again 1 unit on 12/24/2017    Plan:- 1) Recurrent Ascites in the setting of liver cirrhosis -  patient has paracentesis about once a month - patient feels better after paracentesis on 12/22/17 with 5.5 L of fluid removed.   2) Recurrent anemia/Gi Bleed/AVMs- patient with long hx of GI blood loss with hx of GAVE and AVM's; anemia  compounded by CKD, outpatient small bowel enteroscopy from 11/01/2017 at [facility] by Dr Brandon Melnick which showed 4 AVMs that were cauterized, patient has been requiring transfusions since then (8 units of packed cells transfused over the last 6 weeks),  As per Dr Lebron Conners (Hematology) transfuse to keep his hemoglobin above 8.5 - patient was transfused 1 units of packed cells on 12/23/2017 and again 1 unit on 12/24/2017. - CKD probably contributory to his anemia, if H&H drops patient may need repeat small bowel enteroscopy which is not done here per GI would have to go back to [facility] or [facility]  3) Fatigue/OSA- anemia and liver cirrhosis as well as heart failure probably contributing to his fatigue  4) HFpEF-last known EF 60 to 65%, patient with history of chronic grade 2 diastolic dysfunction CHF, watch volume status closely , Lasix as ordered  5) AKI on CKD IV - improving AKI, creatinine on admission 3.38 now down to 2.0 today; baseline creat 2.3- 2.8 - avoid nephrotoxic agents/dehydration/hypotension,  - dc IV lasix, volume overload resolved on exam (except for ascites) - metolazone restarted at 5 mg every other day as at home - resume home torsemide 120 bid - hyponatremia part of advanced liver failure, pt not vol overloaded at this point  6) HTN-stopped metoprolol,c/n atenolol given liver cirrhosis with possible portal hypertension   7) DM-last A1c is  5.1, patient is at risk for hypoglycemia, decrease NPH to 60 units twice daily from 65 units, Use Novolog/Humalog Sliding scale insulin with Accu-Cheks/Fingersticks as ordered   8)Social/Ethics--patient is a DNR/DNI, palliative care consult appreciated, patient would like to continue to attempt control of his GI bleed with GI procedures. Will have family meeting tomorrow.   9) Dispo-PT and OT recommending skilled nursing facility placement, patient, wife and daughter are agreeable, social work consult to help with skilled nursing facility placement  requested  Kelly Splinter MD [facility] pgr [phone] 12/17/2017, 9:25 AM    DVT prophylaxis:  scd/TEDs, no AC due to GIB Code Status: DNR - confirmed with patient/family Family Communication: Wife  Disposition Plan:  SNF Consults called: CM, palliative care, nutrition, PT   Lab Results  Component Value Date   PLT 119 (L) 12/25/2017    Inpatient Medications  Scheduled Meds: . allopurinol  100 mg Oral Daily  . atenolol  12.5 mg Oral BID  . atorvastatin  20 mg Oral q1800  . colchicine  0.6 mg Oral BID  . furosemide  20 mg Intravenous Q12H  . gabapentin  300 mg Oral QID  . insulin aspart  0-5 Units Subcutaneous QHS  . insulin aspart  0-9 Units Subcutaneous TID WC  . insulin NPH Human  60 Units Subcutaneous QHS  . insulin NPH Human  60 Units Subcutaneous QAC breakfast  . metolazone  5 mg Oral QODAY  . pantoprazole  40 mg Oral Daily  . potassium chloride SA  40 mEq Oral BID  . protein supplement shake  11 oz Oral BID BM  . sertraline  50 mg Oral Daily  . sodium chloride flush  3 mL Intravenous Q12H  . tamsulosin  0.4 mg Oral QPC supper   Continuous Infusions: . sodium chloride    . sodium chloride     PRN Meds:.sodium chloride, acetaminophen **OR** acetaminophen, calcium carbonate (dosed in mg elemental calcium), camphor-menthol **AND** hydrOXYzine, ondansetron **OR** ondansetron (ZOFRAN) IV, sodium chloride flush, sorbitol, zolpidem    Anti-infectives (From admission, onward)   None        Objective:   Vitals:   12/25/17 0457 12/25/17 0841 12/25/17 1023 12/25/17 1231  BP: 97/61 (!) 93/53  103/65  Pulse: 74 75  75  Resp: 18     Temp: 97.7 F (36.5 C)   97.6 F (36.4 C)  TempSrc: Oral   Oral  SpO2: 99% 100% 100% 98%  Weight:      Height:        Wt Readings from Last 3 Encounters:  12/25/17 111 kg (244 lb 12.8 oz)  12/05/17 122.1 kg (269 lb 1.9 oz)  11/21/17 122 kg (268 lb 14.4 oz)     Intake/Output Summary (Last 24 hours) at  12/25/2017 1538 Last data filed at 12/25/2017 0724 Gross per 24 hour  Intake 720 ml  Output 3500 ml  Net -2780 ml     Physical Exam  Gen:- Awake Alert,  Obese, no acute distress tired and weak, disheveled HEENT:- [place].AT, No sclera icterus Neck-Supple Neck,No JVD,.  Lungs-  CTAB , diminished in bases  CV- S1, S2 normal Abd-  +ve B.Sounds, Abd Soft, abdomen 3+ ascites nontender Extremity/Skin:-No jaundice, warm and dry, no leg edema Psych-affect is appropriate, oriented x3 Neuro-no new focal deficits, no tremors   Data Review:   Micro Results No results found for this or any previous visit (from the past 240 hour(s)).  Radiology Reports Dg Chest 2 View  Result Date: 12/21/2017 CLINICAL DATA:  Shortness of breath. EXAM: CHEST - 2 VIEW COMPARISON:  Radiograph of September 26, 2017. FINDINGS: Stable cardiomegaly with central pulmonary vascular congestion. Status post coronary artery bypass graft. Single lead left-sided pacemaker is unchanged in position. Status post aortic valve replacement. No pneumothorax or pleural effusion is noted. No consolidative process is noted. Bony thorax is unremarkable. IMPRESSION: Stable cardiomegaly with central pulmonary vascular congestion. Electronically Signed   By: Marijo Conception, M.D.   On: 12/21/2017 13:21   US Paracentesis  Result Date: 12/07/2017 INDICATION: Recurrent ascites EXAM: ULTRASOUND-GUIDED PARACENTESIS COMPARISON:  Previous paracentesis. MEDICATIONS: 10 cc 1% lidocaine. COMPLICATIONS: None immediate TECHNIQUE: Informed written consent was obtained from the patient after a discussion of the risks, benefits and alternatives to treatment. A timeout was performed prior to the initiation of the procedure. Initial ultrasound scanning demonstrates a large amount of ascites within the right lower abdominal quadrant. The right lower abdomen was prepped and draped in the usual sterile fashion. 1% lidocaine with epinephrine was used for local anesthesia.  Under direct ultrasound guidance, a 19 gauge, 7-cm, Yueh catheter was introduced. An ultrasound image was saved for documentation purposed. The paracentesis was performed. The catheter was removed and a dressing was applied. The patient tolerated the procedure well without immediate post procedural complication. FINDINGS: A total of approximately 8 liters of yellow fluid was removed. IMPRESSION: Successful ultrasound-guided paracentesis yielding 8 liters of peritoneal fluid. Read by Lavonia Drafts [facility] Electronically Signed   By: Lavonia Dana M.D.   On: 12/07/2017 09:51   Ir Paracentesis  Result Date: 12/22/2017 INDICATION: Patient with recurrent ascites. Request is made for therapeutic paracentesis. EXAM: ULTRASOUND GUIDED THERAPEUTIC PARACENTESIS MEDICATIONS: 10 mL 2% lidocaine COMPLICATIONS: None immediate. PROCEDURE: Informed written consent was obtained from the patient after a discussion of the risks, benefits and alternatives to treatment. A timeout was performed prior to the initiation of the procedure. Initial ultrasound scanning demonstrates a large amount of ascites within the right lower abdominal quadrant. The right lower abdomen was prepped and draped in the usual sterile fashion. 2% lidocaine was used for local anesthesia. Following this, a 19 gauge, 7-cm, Yueh catheter was introduced. An ultrasound image was saved for documentation purposes. The paracentesis was performed. The catheter was removed and a dressing was applied. The patient tolerated the procedure well without immediate post procedural complication. FINDINGS: A total of approximately 5.5 L of yellow fluid was removed. IMPRESSION: Successful ultrasound-guided therapeutic paracentesis yielding 5.5 liters of peritoneal fluid. Read by: Brynda Greathouse PA-C Electronically Signed   By: Jacqulynn Cadet M.D.   On: 12/22/2017 17:36     CBC Recent Labs  Lab 12/19/17 7564  12/22/17 0045 12/23/17 0958 12/24/17 0038 12/24/17 0852  12/24/17 1931 12/25/17 0426  WBC 10.0   < > 9.0 6.4  --  7.6 7.2 7.6  HGB 6.7*   < > 7.7* 7.5* 8.2* 8.1* 9.2* 9.2*  HCT 21.7*   < > 24.1* 23.4* 25.7* 25.4* 28.5* 27.8*  PLT 169   < > 151 149*  --  136* 129* 119*  MCV 77.8*   < > 79.8 79.9  --  80.4 81.4 80.6  MCH 24.0*   < > 25.5* 25.6*  --  25.6* 26.3 26.7  MCHC 30.9*   < > 32.0 32.1  --  31.9 32.3 33.1  RDW 24.8*   < > 23.3* 23.7*  --  22.6* 22.0* 22.0*  LYMPHSABS 0.7*  --  0.7 0.6*  --   --   --   --  MONOABS 0.7  --  1.0 0.6  --   --   --   --   EOSABS 0.1  --  0.1 0.1  --   --   --   --   BASOSABS 0.1  --  0.0 0.0  --   --   --   --    < > = values in this interval not displayed.    Chemistries  Recent Labs  Lab 12/21/17 1231 12/22/17 0459 12/23/17 0719 12/24/17 0038 12/25/17 0426  NA 127* 121* 122* 123* 123*  K 3.5 4.3 4.7 4.7 4.1  CL 89* 87* 88* 90* 91*  CO2 23 20* 22 22 20*  GLUCOSE 127* 127* 111* 121* 114*  BUN 92* 89* 90* 82* 74*  CREATININE 3.38* 3.45* 3.08* 2.55* 2.07*  CALCIUM 8.5* 8.7* 8.9 8.9 8.9   ------------------------------------------------------------------------------------------------------------------ No results for input(s): CHOL, HDL, LDLCALC, TRIG, CHOLHDL, LDLDIRECT in the last 72 hours.  Lab Results  Component Value Date   HGBA1C 5.1 12/21/2017   ------------------------------------------------------------------------------------------------------------------ No results for input(s): TSH, T4TOTAL, T3FREE, THYROIDAB in the last 72 hours.  Invalid input(s): FREET3 ------------------------------------------------------------------------------------------------------------------ No results for input(s): VITAMINB12, FOLATE, FERRITIN, TIBC, IRON, RETICCTPCT in the last 72 hours.  Coagulation profile No results for input(s): INR, PROTIME in the last 168 hours.  No results for input(s): DDIMER in the last 72 hours.  Cardiac Enzymes No results for input(s): CKMB, TROPONINI, MYOGLOBIN in the  last 168 hours.  Invalid input(s): CK ------------------------------------------------------------------------------------------------------------------    Component Value Date/Time   BNP 154.3 (H) 09/12/2016 1694

## 2017-12-25 NOTE — Progress Notes (Signed)
PT Cancellation Note  Patient Details Name: [name] MRN: [id number] DOB: 1945/09/07   Cancelled Treatment:    Reason Eval/Treat Not Completed: Other (comment). Attempted to see pt however pt very nauseated. Pt just received anti-nausea meds and asked PT to return in afternoon in hopes he isn't nauseated any more. Acute PT to return as able.  Kittie Plater, PT, DPT Pager #: [phone] Office #: [phone]    [place] 12/25/2017, 10:49 AM

## 2017-12-25 NOTE — Progress Notes (Signed)
Patient refused CPAP at this time.

## 2017-12-25 NOTE — Telephone Encounter (Signed)
I called the patient's home number. Advised in the message that Dr.Rehman had ben out of town and I would make him aware tomorrow.

## 2017-12-25 NOTE — Care Management Important Message (Signed)
Important Message  Patient Details  Name: [name] MRN: [id number] Date of Birth: 12/15/1945   Medicare Important Message Given:  Yes    Barb Merino [place] 12/25/2017, 3:37 PM

## 2017-12-25 NOTE — Progress Notes (Signed)
Occupational Therapy Treatment Patient Details Name: [name] MRN: [id number] DOB: Nov 08, 1945 Today's Date: 12/25/2017    History of present illness 72 y.o. male with medical history significant of pacemaker, thrombocytopenia, OSA on CPAP, HTN, HLD, DM, COPD, CKD and associated anemia, cirrhosis, chronic diastolic heart failure (grade 2 diastolic dysfunction), CAD, and afib off AC due to h/o GI bleeds. Pt admitted on 12/21/17 with increasing weakness and abdominal distention consistent with ascites.  OT comments  Pt progressing towards established OT goals. Pt performing toilet transfer with Min A and use of grab bars. Pt performing toilet hygiene with Mod A and demonstrating increased balance during peri care. Pt continue to present with poor activity tolerance and fatigues quickly requiring multiple rest breaks throughout session. Continue to recommend dc to SNF for further OT and will continue to follow acutely as admitted.    Follow Up Recommendations  SNF;Supervision/Assistance - 24 hour    Equipment Recommendations  Other (comment)(Potentially a Rollator that can fit through doors)    Recommendations for Other Services      Precautions / Restrictions Precautions Precautions: Fall Precaution Comments: large distended abdomen Restrictions Weight Bearing Restrictions: No       Mobility Bed Mobility Overal bed mobility: Needs Assistance Bed Mobility: Supine to Sit;Sit to Supine     Supine to sit: Min guard;HOB elevated Sit to supine: Min assist   General bed mobility comments: increased time, used bed rails. MIn A for elevating BLEs over EOB  Transfers Overall transfer level: Needs assistance Equipment used: Rolling walker (2 wheeled) Transfers: Sit to/from Stand Sit to Stand: Min assist;Min guard         General transfer comment: Min A for safe descent to regular height toilet and requiring Min A to power up into standing from toilet    Balance Overall  balance assessment: Needs assistance Sitting-balance support: Bilateral upper extremity supported Sitting balance-Leahy Scale: Fair     Standing balance support: Bilateral upper extremity supported;During functional activity Standing balance-Leahy Scale: Poor Standing balance comment: reliant on RW for support and balance                           ADL either performed or assessed with clinical judgement   ADL Overall ADL's : Needs assistance/impaired     Grooming: Min guard;Wash/dry hands;Standing Grooming Details (indicate cue type and reason): Pt with fatigue after toileting and only able to rinse his hands. Providing pt with hand sanitizer when seated at EOB                 Toilet Transfer: Minimal assistance;Ambulation;RW;Regular Toilet;Grab bars Toilet Transfer Details (indicate cue type and reason): Min A for descent to and power up from regular height toilet. Pt fatigues quickly and in pain when bending forward Toileting- Clothing Manipulation and Hygiene: Sit to/from stand;Moderate assistance Toileting - Clothing Manipulation Details (indicate cue type and reason): Pt requiring Max A for cleaning backside. Pt able to perform peri care in standing with Min Guard A for safety     Functional mobility during ADLs: Rolling walker;Min guard General ADL Comments: Decreased activity tolerance     Vision       Perception     Praxis      Cognition Arousal/Alertness: Awake/alert Behavior During Therapy: WFL for tasks assessed/performed Overall Cognitive Status: Within Functional Limits for tasks assessed  Exercises     Shoulder Instructions       General Comments Wife present throughout session    Pertinent Vitals/ Pain       Pain Assessment: 0-10 Faces Pain Scale: Hurts even more Pain Location: lower abdomen and bilat LEs Pain Descriptors / Indicators: Grimacing Pain Intervention(s):  Monitored during session;Repositioned;Limited activity within patient's tolerance  Home Living                                          Prior Functioning/Environment              Frequency  Min 2X/week        Progress Toward Goals  OT Goals(current goals can now be found in the care plan section)  Progress towards OT goals: Progressing toward goals  Acute Rehab OT Goals Patient Stated Goal: stop the nausea OT Goal Formulation: With patient/family Time For Goal Achievement: 01/07/18 Potential to Achieve Goals: Good ADL Goals Pt Will Transfer to Toilet: with modified independence;ambulating Pt Will Perform Toileting - Clothing Manipulation and hygiene: with modified independence;sit to/from stand Additional ADL Goal #1: Pt will perform bed mobility independently prior to engaging in ADL Additional ADL Goal #2: Pt will recall 3 ways of conserving energy during ADL with 1 or less verbal cues  Plan Discharge plan remains appropriate    Co-evaluation                 AM-PAC PT "6 Clicks" Daily Activity     Outcome Measure   Help from another person eating meals?: None Help from another person taking care of personal grooming?: A Little Help from another person toileting, which includes using toliet, bedpan, or urinal?: A Lot Help from another person bathing (including washing, rinsing, drying)?: A Lot Help from another person to put on and taking off regular upper body clothing?: A Little Help from another person to put on and taking off regular lower body clothing?: A Lot 6 Click Score: 16    End of Session Equipment Utilized During Treatment: Gait belt;Rolling walker  OT Visit Diagnosis: Unsteadiness on feet (R26.81);Other abnormalities of gait and mobility (R26.89);Muscle weakness (generalized) (M62.81);Adult, failure to thrive (R62.7)   Activity Tolerance Patient limited by fatigue   Patient Left in bed;with call bell/phone within  reach;with family/visitor present   Nurse Communication Mobility status;Precautions        Time: 7017-7939 OT Time Calculation (min): 31 min  Charges: OT General Charges $OT Visit: 1 Visit OT Treatments $Self Care/Home Management : 23-37 mins  [place], OTR/L Acute Rehab Pager: [phone] Office: [place] 12/25/2017, 4:11 PM

## 2017-12-25 NOTE — Progress Notes (Signed)
Physical Therapy Treatment Patient Details Name: [name] MRN: [id number] DOB: Oct 25, 1945 Today's Date: 12/25/2017    History of Present Illness [name] is a 72 y.o. male with medical history significant of pacemaker; thrombocytopenia; OSA on CPAP; HTN; HLD; DM; COPD; CKD and associated anemia; cirrhosis; chronic diastolic heart failure (grade 2 diastolic dysfunction); CAD; and afib off AC due to h/o GI bleeds presenting with "I just don't have any energy".  It has been slowly progressing for about a month.  It finally got to the point this AM that he couldn't even get off the commode by himself.  He usually feels much better after blood transfusion, but he received 2 units Tuesday and it didn't make any improvement. He is chronically SOB, possibly just slightly worse.  No cough.  Mild swelling in feet/ankles.  He is just extremely weak, "just sick", can't walk or get up and down.    PT Comments    Pt much improved from initial eval Friday 5/31 however remains very deconditioned with 8/10 bilat LE pain and severed distended abdomen and chronic nausea. Acute PT to con't to follow.    Follow Up Recommendations  SNF;Supervision/Assistance - 24 hour     Equipment Recommendations       Recommendations for Other Services       Precautions / Restrictions Precautions Precautions: Fall Precaution Comments: large distended abdomen Restrictions Weight Bearing Restrictions: No    Mobility  Bed Mobility Overal bed mobility: Needs Assistance Bed Mobility: Supine to Sit;Sit to Supine     Supine to sit: Min guard Sit to supine: Min guard   General bed mobility comments: increased time, used bed rails  Transfers Overall transfer level: Needs assistance Equipment used: Rolling walker (2 wheeled) Transfers: Sit to/from Stand Sit to Stand: Min assist         General transfer comment: pt able to stand up from bed with minA to steady pt during transition of hands from bed to  chair, modA from chair without arms  Ambulation/Gait Ambulation/Gait assistance: Min guard Ambulation Distance (Feet): 30 Feet(x2) Assistive device: Rolling walker (2 wheeled) Gait Pattern/deviations: Step-through pattern;Decreased stride length Gait velocity: slow Gait velocity interpretation: <1.31 ft/sec, indicative of household ambulator General Gait Details: pt with + SOB, SpO2 >94% on RA. required seated rest break due to 8/10 bilat LE pain/weakness   Stairs             Wheelchair Mobility    Modified Rankin (Stroke Patients Only)       Balance Overall balance assessment: Needs assistance Sitting-balance support: Bilateral upper extremity supported Sitting balance-Leahy Scale: Fair     Standing balance support: Bilateral upper extremity supported;During functional activity Standing balance-Leahy Scale: Poor Standing balance comment: reliant on RW for support and balance                            Cognition Arousal/Alertness: Awake/alert Behavior During Therapy: WFL for tasks assessed/performed Overall Cognitive Status: Within Functional Limits for tasks assessed                                        Exercises      General Comments General comments (skin integrity, edema, etc.): pt with very distended abdomen, swelling in LEs      Pertinent Vitals/Pain Pain Assessment: 0-10 Faces Pain Scale: Hurts even more  Pain Location: lower abdomen and bilat LEs Pain Descriptors / Indicators: Grimacing Pain Intervention(s): Monitored during session    Home Living                      Prior Function            PT Goals (current goals can now be found in the care plan section) Acute Rehab PT Goals Patient Stated Goal: stop the nausea Progress towards PT goals: Progressing toward goals    Frequency           PT Plan Current plan remains appropriate    Co-evaluation              AM-PAC PT "6 Clicks"  Daily Activity  Outcome Measure  Difficulty turning over in bed (including adjusting bedclothes, sheets and blankets)?: A Little Difficulty moving from lying on back to sitting on the side of the bed? : A Little Difficulty sitting down on and standing up from a chair with arms (e.g., wheelchair, bedside commode, etc,.)?: A Little Help needed moving to and from a bed to chair (including a wheelchair)?: A Little Help needed walking in hospital room?: A Little Help needed climbing 3-5 steps with a railing? : A Lot 6 Click Score: 17    End of Session Equipment Utilized During Treatment: Gait belt Activity Tolerance: Patient limited by fatigue Patient left: in bed;with call bell/phone within reach;with nursing/sitter in room(MD present) Nurse Communication: Mobility status PT Visit Diagnosis: Unsteadiness on feet (R26.81);Other abnormalities of gait and mobility (R26.89);Repeated falls (R29.6);Muscle weakness (generalized) (M62.81)     Time: 7169-6789 PT Time Calculation (min) (ACUTE ONLY): 24 min  Charges:  $Gait Training: 23-37 mins                    G Codes:       Kittie Plater, PT, DPT Pager #: [phone] Office #: [phone]    [place] 12/25/2017, 2:47 PM

## 2017-12-26 ENCOUNTER — Inpatient Hospital Stay: Payer: Medicare Other

## 2017-12-26 ENCOUNTER — Inpatient Hospital Stay (HOSPITAL_COMMUNITY): Payer: Medicare Other

## 2017-12-26 ENCOUNTER — Telehealth (INDEPENDENT_AMBULATORY_CARE_PROVIDER_SITE_OTHER): Payer: Self-pay | Admitting: Internal Medicine

## 2017-12-26 DIAGNOSIS — R627 Adult failure to thrive: Secondary | ICD-10-CM

## 2017-12-26 DIAGNOSIS — R5382 Chronic fatigue, unspecified: Secondary | ICD-10-CM

## 2017-12-26 LAB — GLUCOSE, CAPILLARY
GLUCOSE-CAPILLARY: 121 mg/dL — AB (ref 65–99)
GLUCOSE-CAPILLARY: 131 mg/dL — AB (ref 65–99)
Glucose-Capillary: 108 mg/dL — ABNORMAL HIGH (ref 65–99)
Glucose-Capillary: 115 mg/dL — ABNORMAL HIGH (ref 65–99)

## 2017-12-26 LAB — CBC
HEMATOCRIT: 26.7 % — AB (ref 39.0–52.0)
HEMOGLOBIN: 8.6 g/dL — AB (ref 13.0–17.0)
MCH: 26.6 pg (ref 26.0–34.0)
MCHC: 32.2 g/dL (ref 30.0–36.0)
MCV: 82.7 fL (ref 78.0–100.0)
Platelets: 116 10*3/uL — ABNORMAL LOW (ref 150–400)
RBC: 3.23 MIL/uL — ABNORMAL LOW (ref 4.22–5.81)
RDW: 22.6 % — ABNORMAL HIGH (ref 11.5–15.5)
WBC: 6.4 10*3/uL (ref 4.0–10.5)

## 2017-12-26 LAB — PREPARE RBC (CROSSMATCH)

## 2017-12-26 MED ORDER — LIDOCAINE HCL (PF) 2 % IJ SOLN
INTRAMUSCULAR | Status: AC
  Filled 2017-12-26: qty 10

## 2017-12-26 MED ORDER — SODIUM CHLORIDE 0.9 % IV SOLN: Freq: Once | INTRAVENOUS | Status: DC

## 2017-12-26 MED ORDER — TORSEMIDE 20 MG PO TABS
60.0000 mg | ORAL_TABLET | Freq: Two times a day (BID) | ORAL | Status: DC
  Administered 2017-12-27 (×2): 60 mg via ORAL
  Filled 2017-12-26 (×3): qty 3

## 2017-12-26 NOTE — Progress Notes (Addendum)
[facility] Liaison:  RN visit 1130am.   Notified by Moshe Salisbury, of patient/family request for Hospice and [place] services at home after discharge.  Chart and patient information are being reviewed with [facility] physician.  Hospice eligibility approved by Dr. Karie Georges.   Writer spoke with patient, Tamela Oddi (wife), Teacher, music (daughter), and son at bedside to initiate education related to hospice philosophy, services and team approach to care.  Patient/family verbalized understanding of information given.  Per discussion, plan is for discharge to home by private vehicle on 12/27/17.  Patient will discharge after PleurX drain placement.    Please send signed and completed DNR form home with patient/family.  Patient will need prescriptions for discharge comfort medications.  DME needs have been discussed, patient currently has the following equipment in the home:  [place], [facility].  Patient/family requests the following DME for delivery to the home: standard wheelchair.  [facility] equipment manager, Frann Rider, has been notified and will contact [place] to arrange delivery to the home.  The home address has been verified and is correct in the chart.  Crystal (daughter) at [phone] or Tamela Oddi (wife) [phone] is the family member to contact to arrange time of delivery.  [facility] is aware of the above.  Completed discharge summary will need to be faxed to [facility] at [phone], when final.  Please notify [facility] when patient is ready to leave the unit at discharge.  (Call [phone] or [phone] if its after 5pm.)  [facility] information and contact numbers have been given to Tamela Oddi (wife) during visit.  Above information shared with Hassan Rowan, [facility].  Please call with any hospice related questions.  Thank you for the referral.  Edyth Gunnels, RN, [facility] Liaison [phone]  All hospital liaisons are now on [place].

## 2017-12-26 NOTE — Progress Notes (Signed)
Patient Demographics:    [name], is a 72 y.o. male, DOB - [birth year], TOI:[id number]  Admit date - 12/21/2017   Admitting Physician Matthew Bongo, MD  Outpatient Primary MD for the patient is Matthew Melter, MD  LOS - 4   Chief Complaint  Patient presents with  . Abnormal Lab  . Weakness        Subjective:    Met with family and patient in pt's room this am and discussed EOL issues.  Patient wants to go home with hospice and the family is supportive.  Have asked CM to start arrangements for home hospice.  Family requesting Hb check/ prbc's prn and one more paracentesis prior to dc.       Assessment  & Plan :    Principal Problem:   Fatigue Active Problems:   Essential hypertension   Type 2 diabetes mellitus (HCC)   OSA (obstructive sleep apnea)   Anemia due to chronic renal failure treated with erythropoietin, stage 3 (moderate) (HCC)   Chronic diastolic CHF (congestive heart failure) (HCC)   Acute renal failure superimposed on stage 4 chronic kidney disease (HCC)   Volume overload   Palliative care by specialist   Abdominal pain   Status post abdominal paracentesis   Assessment/ Plan: 1) Recurrent Ascites in the setting of liver cirrhosis - numerous paracenteses over the last 2.5 years, approx 1 per month. Pt is tired and has decided on transition to hospice care. - paracentesis on 12/22/17 with 5.5 L of fluid removed.   2) Recurrent anemia/Gi Bleed/AVMs- patient with long hx of GI blood loss  3) Fatigue/OSA- anemia and liver cirrhosis as well as heart failure probably contributing to his fatigue  4) HFpEF-last known EF 60 to 65%, patient with history of chronic grade 2 diastolic dysfunction CHF  5) AKI / CKD IV - improving AKI baseline creat 2.3- 2.8  6) HTN-stopped metoprolol , bp's low  7) DM 2   8)Social/Ethics- DNR / DNI  9) Dispo - home with hospice no further  medical care except check Hb and one more paracentesis, will consult IR and get CBC and give prbc's overnight if needed. Expect can be dc'd tomorrow to home.    Kelly Splinter MD [facility] pgr [phone] 12/17/2017, 9:25 AM    DVT prophylaxis:  scd/TEDs Code Status: DNR Family Communication: Wife  Disposition Plan:  SNF Consults called: CM, palliative care, nutrition, PT   Lab Results  Component Value Date   PLT 119 (L) 12/25/2017    Inpatient Medications  Scheduled Meds: . allopurinol  100 mg Oral Daily  . atorvastatin  20 mg Oral q1800  . colchicine  0.6 mg Oral BID  . gabapentin  300 mg Oral QID  . insulin aspart  0-5 Units Subcutaneous QHS  . insulin aspart  0-9 Units Subcutaneous TID WC  . insulin NPH Human  60 Units Subcutaneous QHS  . insulin NPH Human  60 Units Subcutaneous QAC breakfast  . metolazone  5 mg Oral QODAY  . multivitamin with minerals  1 tablet Oral Daily  . pantoprazole  40 mg Oral Daily  . potassium chloride SA  20 mEq Oral Daily  . protein supplement shake  11 oz Oral BID  BM  . sertraline  50 mg Oral Daily  . sodium chloride flush  3 mL Intravenous Q12H  . tamsulosin  0.4 mg Oral QPC supper  . torsemide  120 mg Oral BID   Continuous Infusions: . sodium chloride    . sodium chloride     PRN Meds:.sodium chloride, acetaminophen **OR** acetaminophen, calcium carbonate (dosed in mg elemental calcium), camphor-menthol **AND** hydrOXYzine, ondansetron **OR** ondansetron (ZOFRAN) IV, sodium chloride flush, sorbitol, zolpidem    Anti-infectives (From admission, onward)   None        Objective:   Vitals:   12/25/17 2105 12/25/17 2332 12/26/17 0315 12/26/17 0329  BP: (!) _0   Pulse: 74 75 75   Resp:  (!) 22    Temp: 97.8 F (36.6 C) 97.8 F (36.6 C) 98.2 F (36.8 C)   TempSrc: Oral Oral Oral   SpO2: 99% 100% 97%   Weight:    112.2 kg (247 lb 5.7 oz)  Height:        Wt Readings from Last 3 Encounters:    12/26/17 112.2 kg (247 lb 5.7 oz)  12/05/17 122.1 kg (269 lb 1.9 oz)  11/21/17 122 kg (268 lb 14.4 oz)     Intake/Output Summary (Last 24 hours) at 12/26/2017 1121 Last data filed at 12/26/2017 0837 Gross per 24 hour  Intake 620 ml  Output 1250 ml  Net -630 ml     Physical Exam  Gen:- Awake Alert,  Obese, no acute distress tired and weak, disheveled HEENT:- [place].AT, No sclera icterus Neck-Supple Neck,No JVD,.  Lungs-  CTAB , diminished in bases  CV- S1, S2 normal Abd-  +ve B.Sounds, Abd Soft, abdomen 3-4+ ascites nontender Extremity/Skin:-No jaundice, warm and dry, no leg edema Psych-affect is appropriate, oriented x3 Neuro-no new focal deficits, no tremors   Data Review:    CBC Recent Labs  Lab 12/22/17 0045 12/23/17 0958 12/24/17 0038 12/24/17 0852 12/24/17 1931 12/25/17 0426  WBC 9.0 6.4  --  7.6 7.2 7.6  HGB 7.7* 7.5* 8.2* 8.1* 9.2* 9.2*  HCT 24.1* 23.4* 25.7* 25.4* 28.5* 27.8*  PLT 151 149*  --  136* 129* 119*  MCV 79.8 79.9  --  80.4 81.4 80.6  MCH 25.5* 25.6*  --  25.6* 26.3 26.7  MCHC 32.0 32.1  --  31.9 32.3 33.1  RDW 23.3* 23.7*  --  22.6* 22.0* 22.0*  LYMPHSABS 0.7 0.6*  --   --   --   --   MONOABS 1.0 0.6  --   --   --   --   EOSABS 0.1 0.1  --   --   --   --   BASOSABS 0.0 0.0  --   --   --   --     Chemistries  Recent Labs  Lab 12/21/17 1231 12/22/17 0459 12/23/17 0719 12/24/17 0038 12/25/17 0426  NA 127* 121* 122* 123* 123*  K 3.5 4.3 4.7 4.7 4.1  CL 89* 87* 88* 90* 91*  CO2 23 20* 22 22 20*  GLUCOSE 127* 127* 111* 121* 114*  BUN 92* 89* 90* 82* 74*  CREATININE 3.38* 3.45* 3.08* 2.55* 2.07*  CALCIUM 8.5* 8.7* 8.9 8.9 8.9   ------------------------------------------------------------------------------------------------------------------

## 2017-12-26 NOTE — Progress Notes (Signed)
Blood infusion completed in greater than 4 hours due to infusion interruptions, air in line.

## 2017-12-26 NOTE — Care Management Note (Signed)
Case Management Note  Patient Details  Name: [name] MRN: [id number] Date of Birth: 01-17-1946  Action/Plan: CM talked to Attending MD, patient is for home with Hospice Services; CM talked to patient with his family members at the bedside; they requested Hospice and [place]; referral made as requested; contact person is daughter [name] [phone].  Expected Discharge Date:   possibly 12/27/2017               Expected Discharge Plan:   Home with hospice care  In-House Referral:  Clinical Social Work, [facility], Hospice / Palliative Care  Status of Service:   In progress  Sherrilyn Rist [phone] 12/26/2017, 10:30 AM

## 2017-12-26 NOTE — Plan of Care (Signed)
  Problem: Safety: Goal: Ability to remain free from injury will improve Outcome: Progressing   

## 2017-12-26 NOTE — Clinical Social Work Note (Signed)
Plan is for home with hospice at discharge.   CSW signing off. Consult again if any other social work needs arise.  Dayton Scrape, [place]

## 2017-12-26 NOTE — Telephone Encounter (Signed)
Wife called wanted to let Dr Laural Golden know patient is in hospital at [facility] - you can call her in the room at [phone]

## 2017-12-26 NOTE — Progress Notes (Signed)
Pt refusing CPAP at this time. Advised pt to notify for RT if he changes his mind.  

## 2017-12-26 NOTE — Consult Note (Signed)
Chief Complaint: Patient was seen in consultation today for peritoneal pleurX catheter placement Chief Complaint  Patient presents with  . Abnormal Lab  . Weakness   at the request of Dr Mickel Crow  Supervising Physician: Daryll Brod  Patient Status: [facility] - In-pt  History of Present Illness: [name] is a 72 y.o. male   Cirrhosis Recurrent anemia; GI bleed/AVMs Recurrent ascites Multiple paracentesis; approx every 1-2 weeks x 2 yrs  Now home with Hospice  Discussed with MD and family Plan for placement 6/5 in IR  Past Medical History:  Diagnosis Date  . Aortic stenosis 03/08/2012   a.  s/p tissue AVR 10/13 with Dr. [name];   b. Echo 10/13: mod LVH, EF 55-60%, tissue AVR not well seen, no leak, gradient not too high (mean 79mHg), MAC, mild MR, mild LAE, PASP 38  . Ascites    status post paracentesis with removal of 3.4 L of ascitic fluid  . [place]rial fibrillation ([place]    Permanent; off of Coumadin for now due to GI bleed  . AVM (arteriovenous malformation)    Recurrent GI bleeding requiring multiple transfusions  . CAD (coronary artery disease)    a. s/p CABG 2004;  [place]  LHC 8/13: Free radial to obtuse marginal patent, SVG-diagonal patent, LIMA-LAD patent, EF 65-70%, mean aortic valve gradient 42  . Carotid bruit 06/14/2011   a. pre-AVR dopplers 10/13: no sig ICA stenosis  . Chronic diastolic heart failure ([place]   . Chronic kidney disease   . Cirrhosis ([place]   . COPD (chronic obstructive pulmonary disease) ([place]   . Depression   . DM2 (diabetes mellitus, type 2) (HCC)    at least 10 yrs  . Dyspnea   . GERD (gastroesophageal reflux disease)   . Gout   . H/O hiatal hernia   . Headache   . Hyperlipidemia   . Hypertension    x 15 yrs  . Insomnia   . Iron deficiency anemia    Requiring intravenous iron  . Mediastinal adenopathy 09/22/2011  . OSA (obstructive sleep apnea) 1999   does not wear CPAP regularly  . Osteoarthritis   .  Overweight(278.02)   . Presence of permanent cardiac pacemaker   . Thrombocytopenia ([place]     Past Surgical History:  Procedure Laterality Date  . ABLATION  04-18-14   AVN ablation by Dr KCaryl Comes . AORTIC VALVE REPLACEMENT  04/25/2012   Procedure: AORTIC VALVE REPLACEMENT (AVR);  Surgeon: SMelrose Nakayama MD;  [place]  Service: Open Heart Surgery;  Laterality: N/A;  . AV NODE ABLATION N/A 04/18/2014   Procedure: AV NODE ABLATION;  Surgeon: SDeboraha Sprang MD;  [facility];  Service: Cardiovascular;  Laterality: N/A;  . CARPAL TUNNEL RELEASE    10/08/2003  . CATARACT EXTRACTION W/ INTRAOCULAR LENS IMPLANT Bilateral 09/28/2015 , 10/19/2015  . COLONOSCOPY WITH PROPOFOL N/A 10/28/2016   Procedure: COLONOSCOPY WITH PROPOFOL;  Surgeon: NRogene Houston MD;  [facility];  Service: Endoscopy;  Laterality: N/A;  . CORONARY ANGIOPLASTY WITH STENT PLACEMENT  01/19/2005   drug eluting stent to high grade ostial stenosis of radial artery graft to OM  . CORONARY ARTERY BYPASS GRAFT   10/15/2002    SRevonda Standard HRoxan Hockey M.D.     . ESOPHAGOGASTRODUODENOSCOPY N/A 07/31/2013   Procedure: ESOPHAGOGASTRODUODENOSCOPY (EGD);  Surgeon: DMilus Banister MD;  [place]  Service: Endoscopy;  Laterality: N/A;  . ESOPHAGOGASTRODUODENOSCOPY Left 01/11/2014  Procedure: ESOPHAGOGASTRODUODENOSCOPY (EGD);  Surgeon: Juanita Craver, MD;  Location[facility] ENDOSCOPY;  Service: Endoscopy;  Laterality: Left;  . ESOPHAGOGASTRODUODENOSCOPY (EGD) WITH PROPOFOL N/A 10/28/2016   Procedure: ESOPHAGOGASTRODUODENOSCOPY (EGD) WITH PROPOFOL;  Surgeon: Rogene Houston, MD;  [facility];  Service: Endoscopy;  Laterality: N/A;  . ESOPHAGOGASTRODUODENOSCOPY (EGD) WITH PROPOFOL N/A 08/25/2017   Procedure: ESOPHAGOGASTRODUODENOSCOPY (EGD) WITH PROPOFOL;  Surgeon: Rogene Houston, MD;  [facility];  Service: Endoscopy;  Laterality: N/A;  . GIVENS CAPSULE STUDY N/A 08/26/2017   Procedure:  GIVENS CAPSULE STUDY;  Surgeon: Danie Binder, MD;  [facility];  Service: Endoscopy;  Laterality: N/A;  . HERNIA REPAIR    . IR PARACENTESIS  09/14/2017  . IR PARACENTESIS  12/22/2017  . KNEE ARTHROSCOPY Right 01/20/2016   Procedure: ARTHROSCOPY RIGHT KNEE WITH MENSICAL DEBRIDEMENT;  Surgeon: Gaynelle Arabian, MD;  [facility];  Service: Orthopedics;  Laterality: Right;  . LEFT AND RIGHT HEART CATHETERIZATION WITH CORONARY/GRAFT ANGIOGRAM N/A 03/07/2012   Procedure: LEFT AND RIGHT HEART CATHETERIZATION WITH Beatrix Fetters;  Surgeon: Sherren Mocha, MD;  Location[facility] LAB;  Service: Cardiovascular;  Laterality: N/A;  . lipoma surgery    . OTHER SURGICAL HISTORY  08/26/2011   [facility],  enteroscopy , revealing "three-four AVMs."   . PACEMAKER INSERTION  03-24-14   STJ Assurity single chamber pacemaker implanted by Dr Caryl Comes  . PARACENTESIS  12/2015  . PERMANENT PACEMAKER INSERTION N/A 03/24/2014   Procedure: PERMANENT PACEMAKER INSERTION;  Surgeon: Deboraha Sprang, MD;  Location: [facility] LAB;  Service: Cardiovascular;  Laterality: N/A;  . POLYPECTOMY  10/28/2016   Procedure: POLYPECTOMY;  Surgeon: Rogene Houston, MD;  [facility];  Service: Endoscopy;;  gastric  . RIGHT HEART CATHETERIZATION N/A 01/01/2014   Procedure: RIGHT HEART CATH;  Surgeon: Jolaine Artist, MD;  Location: [facility] CATH LAB;  Service: Cardiovascular;  Laterality: N/A;  . TEE WITHOUT CARDIOVERSION  03/07/2012   Procedure: TRANSESOPHAGEAL ECHOCARDIOGRAM (TEE);  Surgeon: Thayer Headings, MD;  Location: [place];  Service: Cardiovascular;  Laterality: N/A;    Allergies: Codeine; Niacin; Aspirin; and Diltiazem hcl  Medications: Prior to Admission medications   Medication Sig Start Date End Date Taking? Authorizing Provider  acetaminophen (TYLENOL) 500 MG tablet Take 1,000 mg by mouth at bedtime as needed for mild pain or headache.    Yes [provider]  allopurinol (ZYLOPRIM) 100 MG  tablet Take 1 tablet (100 mg total) by mouth daily. 04/18/17  Yes Larey Dresser, MD  atorvastatin (LIPITOR) 20 MG tablet Take 20 mg by mouth daily at 6 PM.    Yes [provider]  colchicine 0.6 MG tablet Take 0.6 mg by mouth 2 (two) times daily. 10/03/17  Yes [provider]  gabapentin (NEURONTIN) 300 MG capsule Take 300 mg by mouth 4 (four) times daily.  12/31/14  Yes [provider]  insulin NPH Human (HUMULIN N,NOVOLIN N) 100 UNIT/ML injection Inject 65 Units into the skin 2 (two) times daily before a meal.    Yes [provider]  insulin regular (NOVOLIN R,HUMULIN R) 100 units/mL injection Inject 30 Units into the skin 2 (two) times daily before a meal.    Yes [provider]  metolazone (ZAROXOLYN) 5 MG tablet Take 1 tablet (5 mg total) by mouth every other day. 10/05/17 01/03/18 Yes Larey Dresser, MD  metoprolol succinate (TOPROL-XL) 25 MG 24 hr tablet Take 1 tablet (25 mg total) by mouth  2 (two) times daily. 11/01/17  Yes Larey Dresser, MD  Multiple Vitamins-Minerals ([employer] SPECTRAVITE ADULT 50+ PO) Take 1 tablet by mouth daily.   Yes [provider]  ondansetron (ZOFRAN) 8 MG tablet Take 1 tablet (8 mg total) by mouth every 8 (eight) hours as needed for nausea or vomiting. 12/14/17  Yes Perlov, Marinell Blight, MD  pantoprazole (PROTONIX) 40 MG tablet Take 1 tablet (40 mg total) by mouth 2 (two) times daily before a meal. Patient taking differently: Take 40 mg by mouth daily.  08/27/17  Yes Tat, Shanon Brow, MD  potassium chloride SA (K-DUR,KLOR-CON) 20 MEQ tablet Take 2 tablets (40 mEq total) by mouth 2 (two) times daily. 10/31/17  Yes Larey Dresser, MD  PROAIR HFA 108 [phone] Base) MCG/ACT inhaler Take 2 puffs by mouth 2 (two) times daily. 03/28/17  Yes [provider]  sertraline (ZOLOFT) 50 MG tablet Take 1 tablet (50 mg total) by mouth daily. 09/01/17  Yes Larey Dresser, MD  spironolactone (ALDACTONE) 25 MG tablet Take 1 tablet (25 mg total)  by mouth daily. 10/18/17  Yes Larey Dresser, MD  tamsulosin (FLOMAX) 0.4 MG CAPS capsule TAKE ONE CAPSULE BY MOUTH EVERY DAY AFTER SUPPER 07/11/17  Yes Larey Dresser, MD  torsemide (DEMADEX) 20 MG tablet TAKE 6 TABLETS (120 MG TOTAL) BY MOUTH 2 (TWO) TIMES DAILY. 11/14/17  Yes Larey Dresser, MD  vitamin C (ASCORBIC ACID) 500 MG tablet Take 500 mg by mouth 2 (two) times daily.   Yes [provider]     Family History  Problem Relation Age of Onset  . Hypertension Father   . Diabetes Father   . Heart disease Father   . COPD Sister   . Cancer Maternal Aunt        Breast cancer   . Cancer Maternal Aunt        Breast cancer   . Diabetes Son   . Cancer Daughter        Cervical cancer  . Coronary artery disease Unknown        FAMILY HISTORY    Social History   Socioeconomic History  . Marital status: Married    Spouse name: Not on file  . Number of children: 3  . Years of education: Not on file  . Highest education level: Not on file  Occupational History  . Occupation: retired    Comment: Associate Professor at [employer]  . Financial resource strain: Not on file  . Food insecurity:    Worry: Not on file    Inability: Not on file  . Transportation needs:    Medical: Not on file    Non-medical: Not on file  Tobacco Use  . Smoking status: Former Smoker    Packs/day: 1.00    Years: 30.00    Pack years: 30.00    Types: Cigarettes    Last attempt to quit: 07/25/2000    Years since quitting: 17.4  . Smokeless tobacco: Never Used  . Tobacco comment: Quit smoking in 2002  Substance and Sexual Activity  . Alcohol use: No    Alcohol/week: 0.6 oz    Types: 1 Shots of liquor per week    Comment: stopped drinking alcohol  . Drug use: No  . Sexual activity: Yes  Lifestyle  . Physical activity:    Days per week: Not on file    Minutes per session: Not on file  . Stress: Not on file  Relationships  . Social connections:    Talks on phone: Not on file      Gets together: Not on file    Attends religious service: Not on file    Active member of club or organization: Not on file    Attends meetings of clubs or organizations: Not on file    Relationship status: Not on file  Other Topics Concern  . Not on file  Social History Narrative   Lives in [place], [place] with wife.     Review of Systems: A 12 point ROS discussed and pertinent positives are indicated in the HPI above.  All other systems are negative.  Review of Systems  Constitutional: Positive for activity change, appetite change and fatigue.  Respiratory: Positive for shortness of breath.   Cardiovascular: Positive for leg swelling. Negative for chest pain.  Gastrointestinal: Positive for abdominal distention, abdominal pain and nausea.  Neurological: Positive for weakness.  Psychiatric/Behavioral: Positive for decreased concentration. Negative for behavioral problems.    Vital Signs: BP (!) 92/48 (BP Location: Left Arm)   Pulse 75   Temp 97.8 F (36.6 C) (Oral)   Resp 20   Ht _0  (1.727 m)   Wt 247 lb 5.7 oz (112.2 kg)   SpO2 91%   BMI 37.61 kg/m   Physical Exam  Cardiovascular: Normal rate and regular rhythm.  No murmur heard. Pulmonary/Chest: He has wheezes. He has rales.  Abdominal: Bowel sounds are normal. He exhibits distension.  Musculoskeletal: Normal range of motion.  Neurological:  asleep  Skin: Skin is warm and dry.  Psychiatric:  Consented with wife and family at bedside  Nursing note and vitals reviewed.   Imaging: Dg Chest 2 View  Result Date: 12/21/2017 CLINICAL DATA:  Shortness of breath. EXAM: CHEST - 2 VIEW COMPARISON:  Radiograph of September 26, 2017. FINDINGS: Stable cardiomegaly with central pulmonary vascular congestion. Status post coronary artery bypass graft. Single lead left-sided pacemaker is unchanged in position. Status post aortic valve replacement. No pneumothorax or pleural effusion is noted. No consolidative process is noted.  Bony thorax is unremarkable. IMPRESSION: Stable cardiomegaly with central pulmonary vascular congestion. Electronically Signed   By: Marijo Conception, M.D.   On: 12/21/2017 13:21   US Paracentesis  Result Date: 12/07/2017 INDICATION: Recurrent ascites EXAM: ULTRASOUND-GUIDED PARACENTESIS COMPARISON:  Previous paracentesis. MEDICATIONS: 10 cc 1% lidocaine. COMPLICATIONS: None immediate TECHNIQUE: Informed written consent was obtained from the patient after a discussion of the risks, benefits and alternatives to treatment. A timeout was performed prior to the initiation of the procedure. Initial ultrasound scanning demonstrates a large amount of ascites within the right lower abdominal quadrant. The right lower abdomen was prepped and draped in the usual sterile fashion. 1% lidocaine with epinephrine was used for local anesthesia. Under direct ultrasound guidance, a 19 gauge, 7-cm, Yueh catheter was introduced. An ultrasound image was saved for documentation purposed. The paracentesis was performed. The catheter was removed and a dressing was applied. The patient tolerated the procedure well without immediate post procedural complication. FINDINGS: A total of approximately 8 liters of yellow fluid was removed. IMPRESSION: Successful ultrasound-guided paracentesis yielding 8 liters of peritoneal fluid. Read by Lavonia Drafts [facility] Electronically Signed   By: Lavonia Dana M.D.   On: 12/07/2017 09:51   Ir Paracentesis  Result Date: 12/22/2017 INDICATION: Patient with recurrent ascites. Request is made for therapeutic paracentesis. EXAM: ULTRASOUND GUIDED THERAPEUTIC PARACENTESIS MEDICATIONS: 10 mL 2% lidocaine COMPLICATIONS: None immediate. PROCEDURE: Informed written consent was  obtained from the patient after a discussion of the risks, benefits and alternatives to treatment. A timeout was performed prior to the initiation of the procedure. Initial ultrasound scanning demonstrates a large amount of ascites within  the right lower abdominal quadrant. The right lower abdomen was prepped and draped in the usual sterile fashion. 2% lidocaine was used for local anesthesia. Following this, a 19 gauge, 7-cm, Yueh catheter was introduced. An ultrasound image was saved for documentation purposes. The paracentesis was performed. The catheter was removed and a dressing was applied. The patient tolerated the procedure well without immediate post procedural complication. FINDINGS: A total of approximately 5.5 L of yellow fluid was removed. IMPRESSION: Successful ultrasound-guided therapeutic paracentesis yielding 5.5 liters of peritoneal fluid. Read by: Brynda Greathouse PA-C Electronically Signed   By: Jacqulynn Cadet M.D.   On: 12/22/2017 17:36    Labs:  CBC: Recent Labs    12/24/17 0852 12/24/17 1931 12/25/17 0426 12/26/17 1135  WBC 7.6 7.2 7.6 6.4  HGB 8.1* 9.2* 9.2* 8.6*  HCT 25.4* 28.5* 27.8* 26.7*  PLT 136* 129* 119* 116*    COAGS: Recent Labs    08/24/17 1404 09/08/17 1640  INR 1.12 1.10    BMP: Recent Labs    12/22/17 0459 12/23/17 0719 12/24/17 0038 12/25/17 0426  NA 121* 122* 123* 123*  K 4.3 4.7 4.7 4.1  CL 87* 88* 90* 91*  CO2 20* 22 22 20*  GLUCOSE 127* 111* 121* 114*  BUN 89* 90* 82* 74*  CALCIUM 8.7* 8.9 8.9 8.9  CREATININE 3.45* 3.08* 2.55* 2.07*  GFRNONAA 17* 19* 24* 31*  GFRAA 19* 22* 28* 36*    LIVER FUNCTION TESTS: Recent Labs    09/26/17 1400 10/17/17 0743 10/30/17 1154 11/28/17 1153  BILITOT 0.6 0.9 0.9 0.8  AST _0 ALT _1 ALKPHOS 79 92 86 84  PROT 7.5 6.8 6.6 7.0  ALBUMIN 3.5 3.2* 3.1* 3.1*    TUMOR MARKERS: No results for input(s): AFPTM, CEA, CA199, CHROMGRNA in the last 8760 hours.  Assessment and Plan:  Liver cirrhosis Recurrent ascites Mult paracentesis every 1-2 weeks for 2 yrs Now home with Hospice Scheduled for peritoneal PleurX catheter placement  Risks and benefits discussed with the patient including bleeding,  infection, damage to adjacent structures, and sepsis.  All of the patient's questions were answered, patient is agreeable to proceed. Consent signed and in chart.  Thank you for this interesting consult.  I greatly enjoyed meeting [name] and look forward to participating in their care.  A copy of this report was sent to the requesting provider on this date.  Electronically Signed: Lavonia Drafts, PA-C 12/26/2017, 3:01 PM   I spent a total of 40 Minutes    in face to face in clinical consultation, greater than 50% of which was counseling/coordinating care for peritoneal PleurX catheter placement

## 2017-12-26 NOTE — Progress Notes (Signed)
Spoke with hospice group who recommended Pleurex catheter to prevent having to come to hospital for recurrent ascites while home on hospice.  Have d/w  IR and they have cancelled the paracentesis for today and will put him on the schedule for Pleurex catheter tomorrow. Will give 2 u prbc's overnight to get Hb up given ongoing slow blood loss.  Should be ready for dc home tomorrow.   Kelly Splinter MD [facility] pgr [phone] 12/17/2017, 9:25 AM

## 2017-12-26 NOTE — Progress Notes (Signed)
Notified Dr. Jonnie Finner that patient's BP is soft.

## 2017-12-26 NOTE — Telephone Encounter (Signed)
I talked with patient on 12/21/2017 and asked him to contact GI physicians at [facility]. Told him I had not received any records from [facility]. He may need repeat endoscopic intervention as he feels it helped him.  He possibly had AVMs.

## 2017-12-27 ENCOUNTER — Inpatient Hospital Stay (HOSPITAL_COMMUNITY): Payer: Medicare Other

## 2017-12-27 DIAGNOSIS — N179 Acute kidney failure, unspecified: Secondary | ICD-10-CM

## 2017-12-27 DIAGNOSIS — N189 Chronic kidney disease, unspecified: Secondary | ICD-10-CM

## 2017-12-27 DIAGNOSIS — I509 Heart failure, unspecified: Secondary | ICD-10-CM

## 2017-12-27 HISTORY — PX: IR PERC TUN PERIT CATH WO PORT S&I /IMAG: IMG2327

## 2017-12-27 LAB — GLUCOSE, CAPILLARY
GLUCOSE-CAPILLARY: 82 mg/dL (ref 65–99)
GLUCOSE-CAPILLARY: 73 mg/dL (ref 65–99)
Glucose-Capillary: 82 mg/dL (ref 65–99)
Glucose-Capillary: 77 mg/dL (ref 65–99)

## 2017-12-27 LAB — PROTIME-INR
INR: 1.1
Prothrombin Time: 14.2 seconds (ref 11.4–15.2)

## 2017-12-27 MED ORDER — MIDAZOLAM HCL 2 MG/2ML IJ SOLN
INTRAMUSCULAR | Status: AC | PRN
  Administered 2017-12-27: 1 mg via INTRAVENOUS

## 2017-12-27 MED ORDER — FENTANYL CITRATE (PF) 100 MCG/2ML IJ SOLN
INTRAMUSCULAR | Status: AC
  Filled 2017-12-27: qty 2

## 2017-12-27 MED ORDER — CEFAZOLIN SODIUM-DEXTROSE 2-4 GM/100ML-% IV SOLN
INTRAVENOUS | Status: AC
  Filled 2017-12-27: qty 100

## 2017-12-27 MED ORDER — TORSEMIDE 20 MG PO TABS: 60.0000 mg | ORAL_TABLET | Freq: Two times a day (BID) | ORAL | 3 refills | Status: AC

## 2017-12-27 MED ORDER — MIDAZOLAM HCL 2 MG/2ML IJ SOLN
INTRAMUSCULAR | Status: AC
  Filled 2017-12-27: qty 2

## 2017-12-27 MED ORDER — LIDOCAINE HCL (PF) 1 % IJ SOLN
INTRAMUSCULAR | Status: AC | PRN
  Administered 2017-12-27: 10 mL

## 2017-12-27 MED ORDER — HYDROXYZINE HCL 25 MG PO TABS: 25.0000 mg | ORAL_TABLET | Freq: Three times a day (TID) | ORAL | 0 refills | Status: AC | PRN

## 2017-12-27 MED ORDER — CEFAZOLIN SODIUM-DEXTROSE 2-4 GM/100ML-% IV SOLN
2.0000 g | INTRAVENOUS | Status: AC
  Administered 2017-12-27: 2 g via INTRAVENOUS

## 2017-12-27 MED ORDER — FENTANYL CITRATE (PF) 100 MCG/2ML IJ SOLN
INTRAMUSCULAR | Status: AC | PRN
  Administered 2017-12-27: 50 ug via INTRAVENOUS

## 2017-12-27 NOTE — Progress Notes (Addendum)
[facility]                                                                              Patient Demographics  [name], is a 72 y.o. male, DOB - [birth year], MPN:[id number]  Admit date - 12/21/2017   Admitting Physician Karmen Bongo, MD  Outpatient Primary MD for the patient is [name], MD  Outpatient specialists:   LOS - 5  days   Medical records reviewed and are as summarized below:    Chief Complaint  Patient presents with  . Abnormal Lab  . Weakness       Brief summary   Patient is a 72 year old male with pacemaker; thrombocytopenia; OSA on CPAP; HTN; HLD; DM; COPD; CKD and associated anemia; cirrhosis; chronic diastolic heart failure (grade 2 diastolic dysfunction); CAD; and afib off AC due to h/o GI bleeds presented with fatigue, generalized weakness, progressively worsening over the last month.  Patient reported it fine to go to the point on the morning of admission that he could not get off the commode by himself.  Chronically short of breath, patient was admitted for further work-up     Assessment & Plan    Recurrent ascites in the setting of liver cirrhosis -Patient had numerous paracentesis over the last 2 years, approximately 1/month. - Patient had repeat paracentesis on 12/22/2017 with 5.5 L removed -Patient subsequently during this hospitalization now decided on transition to hospice care. -Hospice was consulted, recommended Pleurx catheter for recurrent ascites - IR consult obtained, awaiting Pleurx catheter placement today   Recurrent anemia/GI bleed/AVMs -Long standing history of GI blood loss, patient has now decided to pursue hospice care   Failure to thrive with generalized weakness, fatigue -Likely due to anemia, liver cirrhosis, CHF  History of diastolic CHF EF 00%, continue torsemide, metolazone daily.  Currently unable to tolerate spironolactone, antihypertensives due to borderline low BP  Hypertension BP has  been low, hold spironolactone, metoprolol-   Diabetes mellitus 2 -Continue carb modified diet, NPH insulin  Code Status: DNR DVT Prophylaxis: SCD Family Communication: Discussed in detail with the patient, all imaging results, lab results explained to the patient    Disposition Plan: Possible DC today after Pleurx catheter  Time Spent in minutes 35 minutes  Procedures:  Paracentesis  Consultants:   Interventional radiology  Antimicrobials:      Medications  Scheduled Meds: . allopurinol  100 mg Oral Daily  . colchicine  0.6 mg Oral BID  . gabapentin  300 mg Oral QID  . insulin aspart  0-5 Units Subcutaneous QHS  . insulin aspart  0-9 Units Subcutaneous TID WC  . insulin NPH Human  60 Units Subcutaneous QHS  . insulin NPH Human  60 Units Subcutaneous QAC breakfast  . metolazone  5 mg Oral QODAY  . pantoprazole  40 mg Oral Daily  . protein supplement shake  11 oz Oral BID BM  . sertraline  50 mg Oral Daily  . sodium chloride flush  3 mL Intravenous Q12H  . tamsulosin  0.4 mg Oral QPC supper  . torsemide  60 mg Oral BID   Continuous Infusions: . sodium  chloride    . sodium chloride    . sodium chloride     PRN Meds:.sodium chloride, acetaminophen **OR** acetaminophen, calcium carbonate (dosed in mg elemental calcium), camphor-menthol **AND** hydrOXYzine, ondansetron **OR** ondansetron (ZOFRAN) IV, sodium chloride flush, sorbitol, zolpidem   Antibiotics   Anti-infectives (From admission, onward)   None        Subjective:   [name] was seen and examined today.  Sitting up in the bed, feels tired.  Otherwise no specific complaints.  Awaiting Pleurx catheter placement.  No acute events overnight.    Objective:   Vitals:   12/27/17 0310 12/27/17 0433 12/27/17 0519 12/27/17 1131  BP: (!) 88/52 95/74 112/66 (!) 90/50  Pulse: 74 74 74 75  Resp: 18 17 18 16   Temp: 98.1 F (36.7 C) 98.2 F (36.8 C) 98.4 F (36.9 C) 97.8 F (36.6 C)  TempSrc: Oral  Oral Oral Oral  SpO2: 94% 99% 98% 99%  Weight:  113.4 kg (250 lb)    Height:        Intake/Output Summary (Last 24 hours) at 12/27/2017 1239 Last data filed at 12/27/2017 0519 Gross per 24 hour  Intake 1493.14 ml  Output 800 ml  Net 693.14 ml     Wt Readings from Last 3 Encounters:  12/27/17 113.4 kg (250 lb)  12/05/17 122.1 kg (269 lb 1.9 oz)  11/21/17 122 kg (268 lb 14.4 oz)     Exam  General: Alert and oriented x 3, NAD  Eyes:   HEENT:  Atraumatic, normocephalic  Cardiovascular: S1 S2 auscultated. Regular rate and rhythm.  Respiratory: Clear to auscultation bilaterally, no wheezing, rales or rhonchi  Gastrointestinal: Ascites with abdominal distention, nontender  Ext: no pedal edema bilaterally  Neuro: no new deficit  Musculoskeletal: No digital cyanosis, clubbing  Skin: No rashes  Psych: Normal affect and demeanor, alert and oriented x3    Data Reviewed:  I have personally reviewed following labs and imaging studies  Micro Results No results found for this or any previous visit (from the past 240 hour(s)).  Radiology Reports Dg Chest 2 View  Result Date: 12/21/2017 CLINICAL DATA:  Shortness of breath. EXAM: CHEST - 2 VIEW COMPARISON:  Radiograph of September 26, 2017. FINDINGS: Stable cardiomegaly with central pulmonary vascular congestion. Status post coronary artery bypass graft. Single lead left-sided pacemaker is unchanged in position. Status post aortic valve replacement. No pneumothorax or pleural effusion is noted. No consolidative process is noted. Bony thorax is unremarkable. IMPRESSION: Stable cardiomegaly with central pulmonary vascular congestion. Electronically Signed   By: Marijo Conception, M.D.   On: 12/21/2017 13:21   US Paracentesis  Result Date: 12/07/2017 INDICATION: Recurrent ascites EXAM: ULTRASOUND-GUIDED PARACENTESIS COMPARISON:  Previous paracentesis. MEDICATIONS: 10 cc 1% lidocaine. COMPLICATIONS: None immediate TECHNIQUE: Informed written  consent was obtained from the patient after a discussion of the risks, benefits and alternatives to treatment. A timeout was performed prior to the initiation of the procedure. Initial ultrasound scanning demonstrates a large amount of ascites within the right lower abdominal quadrant. The right lower abdomen was prepped and draped in the usual sterile fashion. 1% lidocaine with epinephrine was used for local anesthesia. Under direct ultrasound guidance, a 19 gauge, 7-cm, Yueh catheter was introduced. An ultrasound image was saved for documentation purposed. The paracentesis was performed. The catheter was removed and a dressing was applied. The patient tolerated the procedure well without immediate post procedural complication. FINDINGS: A total of approximately 8 liters of yellow fluid was removed.  IMPRESSION: Successful ultrasound-guided paracentesis yielding 8 liters of peritoneal fluid. Read by Lavonia Drafts [facility] Electronically Signed   By: Lavonia Dana M.D.   On: 12/07/2017 09:51   Ir Paracentesis  Result Date: 12/22/2017 INDICATION: Patient with recurrent ascites. Request is made for therapeutic paracentesis. EXAM: ULTRASOUND GUIDED THERAPEUTIC PARACENTESIS MEDICATIONS: 10 mL 2% lidocaine COMPLICATIONS: None immediate. PROCEDURE: Informed written consent was obtained from the patient after a discussion of the risks, benefits and alternatives to treatment. A timeout was performed prior to the initiation of the procedure. Initial ultrasound scanning demonstrates a large amount of ascites within the right lower abdominal quadrant. The right lower abdomen was prepped and draped in the usual sterile fashion. 2% lidocaine was used for local anesthesia. Following this, a 19 gauge, 7-cm, Yueh catheter was introduced. An ultrasound image was saved for documentation purposes. The paracentesis was performed. The catheter was removed and a dressing was applied. The patient tolerated the procedure well without  immediate post procedural complication. FINDINGS: A total of approximately 5.5 L of yellow fluid was removed. IMPRESSION: Successful ultrasound-guided therapeutic paracentesis yielding 5.5 liters of peritoneal fluid. Read by: Brynda Greathouse PA-C Electronically Signed   By: Jacqulynn Cadet M.D.   On: 12/22/2017 17:36    Lab Data:  CBC: Recent Labs  Lab 12/22/17 0045 12/23/17 5462 12/24/17 0038 12/24/17 7035 12/24/17 1931 12/25/17 0426 12/26/17 1135  WBC 9.0 6.4  --  7.6 7.2 7.6 6.4  NEUTROABS 7.2 5.1  --   --   --   --   --   HGB 7.7* 7.5* 8.2* 8.1* 9.2* 9.2* 8.6*  HCT 24.1* 23.4* 25.7* 25.4* 28.5* 27.8* 26.7*  MCV 79.8 79.9  --  80.4 81.4 80.6 82.7  PLT 151 149*  --  136* 129* 119* 009*   Basic Metabolic Panel: Recent Labs  Lab 12/21/17 1231 12/22/17 0459 12/23/17 0719 12/24/17 0038 12/25/17 0426  NA 127* 121* 122* 123* 123*  K 3.5 4.3 4.7 4.7 4.1  CL 89* 87* 88* 90* 91*  CO2 23 20* 22 22 20*  GLUCOSE 127* 127* 111* 121* 114*  BUN 92* 89* 90* 82* 74*  CREATININE 3.38* 3.45* 3.08* 2.55* 2.07*  CALCIUM 8.5* 8.7* 8.9 8.9 8.9   GFR: Estimated Creatinine Clearance: 41.2 mL/min (A) (by C-G formula based on SCr of 2.07 mg/dL (H)). Liver Function Tests: No results for input(s): AST, ALT, ALKPHOS, BILITOT, PROT, ALBUMIN in the last 168 hours. No results for input(s): LIPASE, AMYLASE in the last 168 hours. No results for input(s): AMMONIA in the last 168 hours. Coagulation Profile: Recent Labs  Lab 12/27/17 0537  INR 1.10   Cardiac Enzymes: No results for input(s): CKTOTAL, CKMB, CKMBINDEX, TROPONINI in the last 168 hours. BNP (last 3 results) No results for input(s): PROBNP in the last 8760 hours. HbA1C: No results for input(s): HGBA1C in the last 72 hours. CBG: Recent Labs  Lab 12/26/17 1645 12/26/17 2122 12/27/17 0829 12/27/17 1126 12/27/17 1224  GLUCAP 131* 115* 82 73 82   Lipid Profile: No results for input(s): CHOL, HDL, LDLCALC, TRIG, CHOLHDL,  LDLDIRECT in the last 72 hours. Thyroid Function Tests: No results for input(s): TSH, T4TOTAL, FREET4, T3FREE, THYROIDAB in the last 72 hours. Anemia Panel: No results for input(s): VITAMINB12, FOLATE, FERRITIN, TIBC, IRON, RETICCTPCT in the last 72 hours. Urine analysis:    Component Value Date/Time   COLORURINE YELLOW 08/25/2017 0842   APPEARANCEUR CLEAR 08/25/2017 0842   LABSPEC 1.009 08/25/2017 0842   PHURINE 5.0  08/25/2017 [place] 08/25/2017 0842   HGBUR NEGATIVE 08/25/2017 0842   [place] 08/25/2017 [place] 08/25/2017 0842   PROTEINUR NEGATIVE 08/25/2017 0842   UROBILINOGEN 0.2 04/17/2012 1430   NITRITE NEGATIVE 08/25/2017 0842   LEUKOCYTESUR NEGATIVE 08/25/2017 0842     Angeline Trick M.D. [facility] 12/27/2017, 12:39 PM  Pager: [phone] Between 7am to 7pm - call Pager - [phone]  After 7pm go to [email or web address] - password [email or web address]  Call night coverage person covering after 7pm

## 2017-12-27 NOTE — Telephone Encounter (Signed)
Dr.Rehman will be made aware. 

## 2017-12-27 NOTE — Progress Notes (Signed)
[facility] Liaison:  RN   Notified by [name] [facility], of patient/family request for Hospice and [place] services at home after discharge.  Chart and patient information are being reviewed with [facility] physician.  Hospice eligibility approved by Dr. Karie Georges.   Writer spoke with patient, [name] (wife), Teacher, music (daughter), and son at bedside to initiate education related to hospice philosophy, services and team approach to care.  Patient/family verbalized understanding of information given.  Per discussion, plan is for discharge to home by private vehicle on 12/27/17.  Patient will discharge after PleurX drain placement.  UPDATE:  Spoke with [name] this morning and patient is waiting for PleurX to be placed.  Patient is eager to go home.   Please send signed and completed DNR form home with patient/family.  Patient will need prescriptions for discharge comfort medications.  DME needs have been discussed, patient currently has the following equipment in the home:  Walker, [facility]  Patient/family requests the following DME for delivery to the home: standard wheelchair.  [facility] equipment manager, Frann Rider, has been notified and will contact [place] to arrange delivery to the home.  The home address has been verified and is correct in the chart.  [name] (daughter) at [phone] or [name] (wife) [phone] is the family member to contact to arrange time of delivery.  UPDATE:  Wheelchair was delivered on 12/26/17.    [facility] is aware of the above.  Completed discharge summary will need to be faxed to [facility] at [phone], when final.  Please notify [facility] when patient is ready to leave the unit at discharge.  (Call [phone] or [phone] if its after 5pm.)  [facility] information and contact numbers have been given to [name] (wife) during visit.  Above information shared with [name], [facility].  Please call with any hospice related questions.  Thank you for the referral.  Edyth Gunnels, RN, [facility] Liaison [phone]  All hospital liaisons are now on [place].

## 2017-12-27 NOTE — Progress Notes (Signed)
Physical Therapy Treatment Patient Details Name: [name] MRN: [id number] DOB: [birth year] Today's Date: 12/27/2017    History of Present Illness 72 y.o. male with medical history significant of pacemaker, thrombocytopenia, OSA on CPAP, HTN, HLD, DM, COPD, CKD and associated anemia, cirrhosis, chronic diastolic heart failure (grade 2 diastolic dysfunction), CAD, and afib off AC due to h/o GI bleeds. Pt admitted on 12/21/17 with increasing weakness and abdominal distention consistent with ascites.    PT Comments    Patient demonstrating mild progression this visit, increasing ambulation distance to 45' with min guard. Patient is still very weak. Patient planning on returning home, would benefit from [place] to establish proper HEP to mitigate continued functional decline and decrease caregiver burden. Updated recs/ frequency to reflect.    Follow Up Recommendations  Supervision/Assistance - 24 hour;Home health PT     Equipment Recommendations       Recommendations for Other Services OT consult     Precautions / Restrictions Precautions Precautions: Fall Restrictions Weight Bearing Restrictions: No    Mobility  Bed Mobility Overal bed mobility: Needs Assistance Bed Mobility: Supine to Sit;Sit to Supine     Supine to sit: Min guard;HOB elevated Sit to supine: Min assist      Transfers Overall transfer level: Needs assistance Equipment used: Rolling walker (2 wheeled) Transfers: Sit to/from Stand Sit to Stand: Min assist;Min guard            Ambulation/Gait Ambulation/Gait assistance: Min guard Ambulation Distance (Feet): 40 Feet Assistive device: Rolling walker (2 wheeled) Gait Pattern/deviations: Step-through pattern;Decreased stride length Gait velocity: decreased   General Gait Details: Patient ambulating into hallway and back to bed today with RW and Min guard on RA, with unsteady and weak gat. chair follow for safety   Stairs              Wheelchair Mobility    Modified Rankin (Stroke Patients Only)       Balance Overall balance assessment: Needs assistance Sitting-balance support: Bilateral upper extremity supported Sitting balance-Leahy Scale: Fair     Standing balance support: Bilateral upper extremity supported;During functional activity Standing balance-Leahy Scale: Poor Standing balance comment: reliant on RW for support and balance                            Cognition Arousal/Alertness: Awake/alert Behavior During Therapy: WFL for tasks assessed/performed Overall Cognitive Status: Within Functional Limits for tasks assessed                                        Exercises General Exercises - Lower Extremity Long Arc Quad: 10 reps    General Comments        Pertinent Vitals/Pain Pain Assessment: Faces Faces Pain Scale: Hurts even more Pain Location: lower abdomen and bilat LEs Pain Descriptors / Indicators: Grimacing Pain Intervention(s): Limited activity within patient's tolerance;Premedicated before session;Monitored during session;Repositioned    Home Living                      Prior Function            PT Goals (current goals can now be found in the care plan section) Acute Rehab PT Goals Patient Stated Goal: rest Time For Goal Achievement: 12/29/17 Potential to Achieve Goals: Good Progress towards PT goals: Progressing toward goals  Frequency    Min 3X/week      PT Plan Discharge plan needs to be updated    Co-evaluation              AM-PAC PT "6 Clicks" Daily Activity  Outcome Measure  Difficulty turning over in bed (including adjusting bedclothes, sheets and blankets)?: A Little Difficulty moving from lying on back to sitting on the side of the bed? : Unable Difficulty sitting down on and standing up from a chair with arms (e.g., wheelchair, bedside commode, etc,.)?: Unable Help needed moving to and from a bed to  chair (including a wheelchair)?: A Lot Help needed walking in hospital room?: A Lot Help needed climbing 3-5 steps with a railing? : Total 6 Click Score: 10    End of Session Equipment Utilized During Treatment: Gait belt Activity Tolerance: Patient limited by fatigue Patient left: in bed;with call bell/phone within reach;with nursing/sitter in room Nurse Communication: Mobility status PT Visit Diagnosis: Unsteadiness on feet (R26.81);Other abnormalities of gait and mobility (R26.89);Repeated falls (R29.6);Muscle weakness (generalized) (M62.81)     Time: 1020-1040 PT Time Calculation (min) (ACUTE ONLY): 20 min  Charges:  $Gait Training: 8-22 mins                    G Codes:       Reinaldo Berber, PT, DPT Acute Rehab Services Pager: [phone]     Reinaldo Berber 12/27/2017, 11:15 AM

## 2017-12-27 NOTE — Plan of Care (Signed)
  Problem: Activity: Goal: Risk for activity intolerance will decrease Outcome: Progressing   

## 2017-12-27 NOTE — Progress Notes (Signed)
OT Cancellation Note  Patient Details Name: JOCSAN MCGINLEY MRN: [id number] DOB: 09/02/1945   Cancelled Treatment:    Reason Eval/Treat Not Completed: Patient at procedure or test/ unavailable. INTERV RAD. Will return as schedule allows.   [place], OTR/L Acute Rehab Pager: [phone] Office: [phone] 12/27/2017, 3:09 PM

## 2017-12-27 NOTE — Procedures (Signed)
Interventional Radiology Procedure Note  Procedure: Placement of a peritoneal pleurX catheter  Complications: None  Estimated Blood Loss: None  Recommendations: - Daily use per prtocol  Signed,  Criselda Peaches, MD

## 2017-12-27 NOTE — Discharge Summary (Signed)
Physician Discharge Summary   Patient ID: [name] MRN: [id number] DOB/AGE: [birth year] 72 y.o.  Admit date: 12/21/2017 Discharge date: 12/27/2017  Primary Care Physician:  [name], MD   Recommendations for Outpatient Follow-up:  1. Follow up with PCP in 1-2 weeks  Home Health: PT OT, hospice Equipment/Devices: Pleurx catheter placed 6/5  Discharge Condition: Guarded CODE STATUS: DNR   Diet recommendation: Carb modified diet   Discharge Diagnoses:    Recurrent ascites in the setting of liver cirrhosis status post Pleurx catheter . Diabetes mellitus type 2 . Fatigue . Acute renal failure superimposed on stage 4 chronic kidney disease ([place]) . Anemia due to chronic renal failure treated with erythropoietin, stage 3 (moderate) (H[facility]) . Chronic diastolic CHF (congestive heart failure) ([place]) . Essential hypertension . OSA (obstructive sleep apnea)    Consults: Interventional radiology    Allergies:   Allergies  Allergen Reactions  . Codeine Other (See Comments) and Palpitations    Hurting in chest  . Niacin Other (See Comments)    Felt a "severe burning sensation" Hot flashes  . Aspirin Other (See Comments)    History of Bleeding ulcers   . Diltiazem Hcl Other (See Comments)    Gets hot, hives, itching     DISCHARGE MEDICATIONS: Allergies as of 12/27/2017      Reactions   Codeine Other (See Comments), Palpitations   Hurting in chest   Niacin Other (See Comments)   Felt a "severe burning sensation" Hot flashes   Aspirin Other (See Comments)   History of Bleeding ulcers    Diltiazem Hcl Other (See Comments)   Gets hot, hives, itching      Medication List    STOP taking these medications   insulin regular 100 units/mL injection Commonly known as:  NOVOLIN R,HUMULIN R   metoprolol succinate 25 MG 24 hr tablet Commonly known as:  TOPROL-XL   potassium chloride SA 20 MEQ tablet Commonly known as:  K-DUR,KLOR-CON   spironolactone 25 MG  tablet Commonly known as:  ALDACTONE     TAKE these medications   acetaminophen 500 MG tablet Commonly known as:  TYLENOL Take 1,000 mg by mouth at bedtime as needed for mild pain or headache.   allopurinol 100 MG tablet Commonly known as:  ZYLOPRIM Take 1 tablet (100 mg total) by mouth daily.   atorvastatin 20 MG tablet Commonly known as:  LIPITOR Take 20 mg by mouth daily at 6 PM.   colchicine 0.6 MG tablet Take 0.6 mg by mouth 2 (two) times daily.   [employer] SPECTRAVITE ADULT 50+ PO Take 1 tablet by mouth daily.   gabapentin 300 MG capsule Commonly known as:  NEURONTIN Take 300 mg by mouth 4 (four) times daily.   hydrOXYzine 25 MG tablet Commonly known as:  ATARAX/VISTARIL Take 1 tablet (25 mg total) by mouth every 8 (eight) hours as needed for itching.   insulin NPH Human 100 UNIT/ML injection Commonly known as:  HUMULIN N,NOVOLIN N Inject 65 Units into the skin 2 (two) times daily before a meal.   metolazone 5 MG tablet Commonly known as:  ZAROXOLYN Take 1 tablet (5 mg total) by mouth every other day.   ondansetron 8 MG tablet Commonly known as:  ZOFRAN Take 1 tablet (8 mg total) by mouth every 8 (eight) hours as needed for nausea or vomiting.   pantoprazole 40 MG tablet Commonly known as:  PROTONIX Take 1 tablet (40 mg total) by mouth 2 (two) times daily before a  meal. What changed:  when to take this   PROAIR HFA 108 (90 Base) MCG/ACT inhaler Generic drug:  albuterol Take 2 puffs by mouth 2 (two) times daily.   sertraline 50 MG tablet Commonly known as:  ZOLOFT Take 1 tablet (50 mg total) by mouth daily.   tamsulosin 0.4 MG Caps capsule Commonly known as:  FLOMAX TAKE ONE CAPSULE BY MOUTH EVERY DAY AFTER SUPPER   torsemide 20 MG tablet Commonly known as:  DEMADEX Take 3 tablets (60 mg total) by mouth 2 (two) times daily. What changed:  how much to take   vitamin C 500 MG tablet Commonly known as:  ASCORBIC ACID Take 500 mg by mouth 2 (two) times  daily.        Brief H and P: For complete details please refer to admission H and P, but in briefPatient is a 72 year old male with pacemaker; thrombocytopenia; OSA on CPAP; HTN; HLD; DM; COPD; CKD and associated anemia; cirrhosis; chronic diastolic heart failure (grade 2 diastolic dysfunction); CAD; and afib off AC due to h/o GI bleeds presented with fatigue, generalized weakness, progressively worsening over the last month.  Patient reported it fine to go to the point on the morning of admission that he could not get off the commode by himself.  Chronically short of breath, patient was admitted for further work-up   Hospital Course:   Recurrent ascites in the setting of liver cirrhosis -Patient had numerous paracentesis over the last 2 years, approximately 1/month. - Patient had repeat paracentesis on 12/22/2017 with 5.5 L removed -Patient subsequently during this hospitalization now decided on transition to hospice care. -Hospice was consulted, recommended Pleurx catheter for recurrent ascites - IR consult obtained, Pleurx catheter was placed on 6/5  Recurrent anemia/GI bleed/AVMs -Long standing history of GI blood loss, patient has now decided to pursue hospice care  Failure to thrive with generalized weakness, fatigue -Likely due to anemia, liver cirrhosis, CHF  History of diastolic CHF EF 57%, continue torsemide, metolazone.  Currently unable to tolerate spironolactone, antihypertensives due to borderline low BP  Hypertension BP has been low, hold spironolactone, metoprolol-   Diabetes mellitus type II, insulin-dependent  -Continue carb modified diet, NPH insulin   Day of Discharge S: Doing better after Pleurx catheter placement, hoping to go home after the procedure  BP 121/68 (BP Location: Left Arm)   Pulse 75   Temp 97.8 F (36.6 C) (Oral)   Resp 15   Ht 5\' 8"  (1.727 m)   Wt 113.4 kg (250 lb)   SpO2 100%   BMI 38.01 kg/m   Physical Exam: General:  Alert and awake oriented x3 not in any acute distress. HEENT: anicteric sclera, pupils reactive to light and accommodation CVS: S1-S2 clear no murmur rubs or gallops Chest: clear to auscultation bilaterally, no wheezing rales or rhonchi Abdomen: soft nontender, distended, normal bowel sounds Extremities: no cyanosis, clubbing or edema noted bilaterally Neuro: no new deficits   The results of significant diagnostics from this hospitalization (including imaging, microbiology, ancillary and laboratory) are listed below for reference.      Procedures/Studies:  Dg Chest 2 View  Result Date: 12/21/2017 CLINICAL DATA:  Shortness of breath. EXAM: CHEST - 2 VIEW COMPARISON:  Radiograph of September 26, 2017. FINDINGS: Stable cardiomegaly with central pulmonary vascular congestion. Status post coronary artery bypass graft. Single lead left-sided pacemaker is unchanged in position. Status post aortic valve replacement. No pneumothorax or pleural effusion is noted. No consolidative process is noted. Bony thorax  is unremarkable. IMPRESSION: Stable cardiomegaly with central pulmonary vascular congestion. Electronically Signed   By: Marijo Conception, M.D.   On: 12/21/2017 13:21   US Paracentesis  Result Date: 12/07/2017 INDICATION: Recurrent ascites EXAM: ULTRASOUND-GUIDED PARACENTESIS COMPARISON:  Previous paracentesis. MEDICATIONS: 10 cc 1% lidocaine. COMPLICATIONS: None immediate TECHNIQUE: Informed written consent was obtained from the patient after a discussion of the risks, benefits and alternatives to treatment. A timeout was performed prior to the initiation of the procedure. Initial ultrasound scanning demonstrates a large amount of ascites within the right lower abdominal quadrant. The right lower abdomen was prepped and draped in the usual sterile fashion. 1% lidocaine with epinephrine was used for local anesthesia. Under direct ultrasound guidance, a 19 gauge, 7-cm, Yueh catheter was introduced. An  ultrasound image was saved for documentation purposed. The paracentesis was performed. The catheter was removed and a dressing was applied. The patient tolerated the procedure well without immediate post procedural complication. FINDINGS: A total of approximately 8 liters of yellow fluid was removed. IMPRESSION: Successful ultrasound-guided paracentesis yielding 8 liters of peritoneal fluid. Read by Lavonia Drafts [facility] Electronically Signed   By: Lavonia Dana M.D.   On: 12/07/2017 09:51   Ir Paracentesis  Result Date: 12/22/2017 INDICATION: Patient with recurrent ascites. Request is made for therapeutic paracentesis. EXAM: ULTRASOUND GUIDED THERAPEUTIC PARACENTESIS MEDICATIONS: 10 mL 2% lidocaine COMPLICATIONS: None immediate. PROCEDURE: Informed written consent was obtained from the patient after a discussion of the risks, benefits and alternatives to treatment. A timeout was performed prior to the initiation of the procedure. Initial ultrasound scanning demonstrates a large amount of ascites within the right lower abdominal quadrant. The right lower abdomen was prepped and draped in the usual sterile fashion. 2% lidocaine was used for local anesthesia. Following this, a 19 gauge, 7-cm, Yueh catheter was introduced. An ultrasound image was saved for documentation purposes. The paracentesis was performed. The catheter was removed and a dressing was applied. The patient tolerated the procedure well without immediate post procedural complication. FINDINGS: A total of approximately 5.5 L of yellow fluid was removed. IMPRESSION: Successful ultrasound-guided therapeutic paracentesis yielding 5.5 liters of peritoneal fluid. Read by: Brynda Greathouse PA-C Electronically Signed   By: Jacqulynn Cadet M.D.   On: 12/22/2017 17:36      LAB RESULTS: Basic Metabolic Panel: Recent Labs  Lab 12/24/17 0038 12/25/17 0426  NA 123* 123*  K 4.7 4.1  CL 90* 91*  CO2 22 20*  GLUCOSE 121* 114*  BUN 82* 74*  CREATININE  2.55* 2.07*  CALCIUM 8.9 8.9   Liver Function Tests: No results for input(s): AST, ALT, ALKPHOS, BILITOT, PROT, ALBUMIN in the last 168 hours. No results for input(s): LIPASE, AMYLASE in the last 168 hours. No results for input(s): AMMONIA in the last 168 hours. CBC: Recent Labs  Lab 12/23/17 0958  12/25/17 0426 12/26/17 1135  WBC 6.4   < > 7.6 6.4  NEUTROABS 5.1  --   --   --   HGB 7.5*   < > 9.2* 8.6*  HCT 23.4*   < > 27.8* 26.7*  MCV 79.9   < > 80.6 82.7  PLT 149*   < > 119* 116*   < > = values in this interval not displayed.   Cardiac Enzymes: No results for input(s): CKTOTAL, CKMB, CKMBINDEX, TROPONINI in the last 168 hours. BNP: Invalid input(s): POCBNP CBG: Recent Labs  Lab 12/27/17 1224 12/27/17 1718  GLUCAP 82 77      Disposition  and Follow-up: Discharge Instructions    Complete patient signature process for consent form   Complete by:  Dec 22, 2017    Practitioner attestation of consent   Complete by:  Dec 22, 2017    I, the ordering practitioner, attest that I have discussed with the patient the benefits, risks, side effects, alternatives, likelihood of achieving goals and potential problems during recovery for the procedure listed.   Procedure:  Blood Product(s)   Care order/instruction   Complete by:  As directed    Transfuse Parameters   Care order/instruction   Complete by:  As directed    Transfuse Parameters   Care order/instruction   Complete by:  As directed    Transfuse Parameters   Care order/instruction   Complete by:  As directed    Transfuse Parameters   Complete patient signature process for consent form   Complete by:  As directed    Complete patient signature process for consent form   Complete by:  As directed        DISPOSITION: home with hospice  DISCHARGE FOLLOW-UP Follow-up Information    [name], MD. Schedule an appointment as soon as possible for a visit in 2 weeks.   Specialty:  Family Medicine Contact  information: [address] [place] [address] [phone]            Time coordinating discharge:  40-minutes  Signed:   Estill Cotta M.D. [facility] 12/27/2017, 6:44 PM Pager: [phone]

## 2017-12-28 DIAGNOSIS — K219 Gastro-esophageal reflux disease without esophagitis: Secondary | ICD-10-CM | POA: Diagnosis not present

## 2017-12-28 DIAGNOSIS — G4733 Obstructive sleep apnea (adult) (pediatric): Secondary | ICD-10-CM | POA: Diagnosis not present

## 2017-12-28 DIAGNOSIS — I4891 Unspecified atrial fibrillation: Secondary | ICD-10-CM | POA: Diagnosis not present

## 2017-12-28 DIAGNOSIS — D6959 Other secondary thrombocytopenia: Secondary | ICD-10-CM | POA: Diagnosis not present

## 2017-12-28 DIAGNOSIS — N184 Chronic kidney disease, stage 4 (severe): Secondary | ICD-10-CM | POA: Diagnosis not present

## 2017-12-28 DIAGNOSIS — K922 Gastrointestinal hemorrhage, unspecified: Secondary | ICD-10-CM | POA: Diagnosis not present

## 2017-12-28 DIAGNOSIS — E119 Type 2 diabetes mellitus without complications: Secondary | ICD-10-CM | POA: Diagnosis not present

## 2017-12-28 DIAGNOSIS — J449 Chronic obstructive pulmonary disease, unspecified: Secondary | ICD-10-CM | POA: Diagnosis not present

## 2017-12-28 DIAGNOSIS — R188 Other ascites: Secondary | ICD-10-CM | POA: Diagnosis not present

## 2017-12-28 DIAGNOSIS — I5030 Unspecified diastolic (congestive) heart failure: Secondary | ICD-10-CM | POA: Diagnosis not present

## 2017-12-28 DIAGNOSIS — I10 Essential (primary) hypertension: Secondary | ICD-10-CM | POA: Diagnosis not present

## 2017-12-28 DIAGNOSIS — M1990 Unspecified osteoarthritis, unspecified site: Secondary | ICD-10-CM | POA: Diagnosis not present

## 2017-12-28 DIAGNOSIS — M109 Gout, unspecified: Secondary | ICD-10-CM | POA: Diagnosis not present

## 2017-12-28 DIAGNOSIS — Q2730 Arteriovenous malformation, site unspecified: Secondary | ICD-10-CM | POA: Diagnosis not present

## 2017-12-28 DIAGNOSIS — N401 Enlarged prostate with lower urinary tract symptoms: Secondary | ICD-10-CM | POA: Diagnosis not present

## 2017-12-28 DIAGNOSIS — E785 Hyperlipidemia, unspecified: Secondary | ICD-10-CM | POA: Diagnosis not present

## 2017-12-28 DIAGNOSIS — K7460 Unspecified cirrhosis of liver: Secondary | ICD-10-CM | POA: Diagnosis not present

## 2017-12-28 DIAGNOSIS — F339 Major depressive disorder, recurrent, unspecified: Secondary | ICD-10-CM | POA: Diagnosis not present

## 2017-12-28 DIAGNOSIS — I2510 Atherosclerotic heart disease of native coronary artery without angina pectoris: Secondary | ICD-10-CM | POA: Diagnosis not present

## 2017-12-28 LAB — TYPE AND SCREEN
ABO/RH(D): A POS
Antibody Screen: POSITIVE
DONOR AG TYPE: NEGATIVE
DONOR AG TYPE: NEGATIVE
UNIT DIVISION: 0
Unit division: 0

## 2017-12-28 LAB — BPAM RBC
BLOOD PRODUCT EXPIRATION DATE: 201906122359
Blood Product Expiration Date: 201906192359
ISSUE DATE / TIME: 201906041824
ISSUE DATE / TIME: 201906050236
UNIT TYPE AND RH: 600
Unit Type and Rh: 6200

## 2017-12-29 DIAGNOSIS — I5030 Unspecified diastolic (congestive) heart failure: Secondary | ICD-10-CM | POA: Diagnosis not present

## 2017-12-29 DIAGNOSIS — N184 Chronic kidney disease, stage 4 (severe): Secondary | ICD-10-CM | POA: Diagnosis not present

## 2017-12-29 DIAGNOSIS — K7460 Unspecified cirrhosis of liver: Secondary | ICD-10-CM | POA: Diagnosis not present

## 2017-12-29 DIAGNOSIS — R188 Other ascites: Secondary | ICD-10-CM | POA: Diagnosis not present

## 2017-12-29 DIAGNOSIS — Q2730 Arteriovenous malformation, site unspecified: Secondary | ICD-10-CM | POA: Diagnosis not present

## 2017-12-29 DIAGNOSIS — K922 Gastrointestinal hemorrhage, unspecified: Secondary | ICD-10-CM | POA: Diagnosis not present

## 2018-01-01 ENCOUNTER — Ambulatory Visit (INDEPENDENT_AMBULATORY_CARE_PROVIDER_SITE_OTHER): Payer: Medicare Other | Admitting: *Deleted

## 2018-01-01 DIAGNOSIS — K922 Gastrointestinal hemorrhage, unspecified: Secondary | ICD-10-CM | POA: Diagnosis not present

## 2018-01-01 DIAGNOSIS — Q2730 Arteriovenous malformation, site unspecified: Secondary | ICD-10-CM | POA: Diagnosis not present

## 2018-01-01 DIAGNOSIS — I442 Atrioventricular block, complete: Secondary | ICD-10-CM | POA: Diagnosis not present

## 2018-01-01 DIAGNOSIS — I5030 Unspecified diastolic (congestive) heart failure: Secondary | ICD-10-CM | POA: Diagnosis not present

## 2018-01-01 DIAGNOSIS — K7460 Unspecified cirrhosis of liver: Secondary | ICD-10-CM | POA: Diagnosis not present

## 2018-01-01 DIAGNOSIS — R188 Other ascites: Secondary | ICD-10-CM | POA: Diagnosis not present

## 2018-01-01 DIAGNOSIS — N184 Chronic kidney disease, stage 4 (severe): Secondary | ICD-10-CM | POA: Diagnosis not present

## 2018-01-01 NOTE — Progress Notes (Signed)
Remote pacemaker transmission.   

## 2018-01-02 ENCOUNTER — Inpatient Hospital Stay: Payer: Medicare Other

## 2018-01-02 ENCOUNTER — Encounter: Payer: Self-pay | Admitting: Cardiology

## 2018-01-03 DIAGNOSIS — R188 Other ascites: Secondary | ICD-10-CM | POA: Diagnosis not present

## 2018-01-03 DIAGNOSIS — K7460 Unspecified cirrhosis of liver: Secondary | ICD-10-CM | POA: Diagnosis not present

## 2018-01-03 DIAGNOSIS — I5030 Unspecified diastolic (congestive) heart failure: Secondary | ICD-10-CM | POA: Diagnosis not present

## 2018-01-03 DIAGNOSIS — N184 Chronic kidney disease, stage 4 (severe): Secondary | ICD-10-CM | POA: Diagnosis not present

## 2018-01-03 DIAGNOSIS — K922 Gastrointestinal hemorrhage, unspecified: Secondary | ICD-10-CM | POA: Diagnosis not present

## 2018-01-03 DIAGNOSIS — Q2730 Arteriovenous malformation, site unspecified: Secondary | ICD-10-CM | POA: Diagnosis not present

## 2018-01-04 ENCOUNTER — Encounter (HOSPITAL_COMMUNITY): Payer: Medicare Other

## 2018-01-05 DIAGNOSIS — K7460 Unspecified cirrhosis of liver: Secondary | ICD-10-CM | POA: Diagnosis not present

## 2018-01-05 DIAGNOSIS — Q2730 Arteriovenous malformation, site unspecified: Secondary | ICD-10-CM | POA: Diagnosis not present

## 2018-01-05 DIAGNOSIS — R188 Other ascites: Secondary | ICD-10-CM | POA: Diagnosis not present

## 2018-01-05 DIAGNOSIS — I5030 Unspecified diastolic (congestive) heart failure: Secondary | ICD-10-CM | POA: Diagnosis not present

## 2018-01-05 DIAGNOSIS — K922 Gastrointestinal hemorrhage, unspecified: Secondary | ICD-10-CM | POA: Diagnosis not present

## 2018-01-05 DIAGNOSIS — N184 Chronic kidney disease, stage 4 (severe): Secondary | ICD-10-CM | POA: Diagnosis not present

## 2018-01-08 ENCOUNTER — Encounter (HOSPITAL_COMMUNITY): Payer: Self-pay | Admitting: Interventional Radiology

## 2018-01-08 DIAGNOSIS — Q2730 Arteriovenous malformation, site unspecified: Secondary | ICD-10-CM | POA: Diagnosis not present

## 2018-01-08 DIAGNOSIS — K7460 Unspecified cirrhosis of liver: Secondary | ICD-10-CM | POA: Diagnosis not present

## 2018-01-08 DIAGNOSIS — N184 Chronic kidney disease, stage 4 (severe): Secondary | ICD-10-CM | POA: Diagnosis not present

## 2018-01-08 DIAGNOSIS — K922 Gastrointestinal hemorrhage, unspecified: Secondary | ICD-10-CM | POA: Diagnosis not present

## 2018-01-08 DIAGNOSIS — I5030 Unspecified diastolic (congestive) heart failure: Secondary | ICD-10-CM | POA: Diagnosis not present

## 2018-01-08 DIAGNOSIS — R188 Other ascites: Secondary | ICD-10-CM | POA: Diagnosis not present

## 2018-01-09 ENCOUNTER — Other Ambulatory Visit: Payer: Medicare Other

## 2018-01-09 DIAGNOSIS — R188 Other ascites: Secondary | ICD-10-CM | POA: Diagnosis not present

## 2018-01-09 DIAGNOSIS — K7460 Unspecified cirrhosis of liver: Secondary | ICD-10-CM | POA: Diagnosis not present

## 2018-01-09 DIAGNOSIS — K922 Gastrointestinal hemorrhage, unspecified: Secondary | ICD-10-CM | POA: Diagnosis not present

## 2018-01-09 DIAGNOSIS — Q2730 Arteriovenous malformation, site unspecified: Secondary | ICD-10-CM | POA: Diagnosis not present

## 2018-01-09 DIAGNOSIS — N184 Chronic kidney disease, stage 4 (severe): Secondary | ICD-10-CM | POA: Diagnosis not present

## 2018-01-09 DIAGNOSIS — I5030 Unspecified diastolic (congestive) heart failure: Secondary | ICD-10-CM | POA: Diagnosis not present

## 2018-01-11 DIAGNOSIS — K7460 Unspecified cirrhosis of liver: Secondary | ICD-10-CM | POA: Diagnosis not present

## 2018-01-11 DIAGNOSIS — K922 Gastrointestinal hemorrhage, unspecified: Secondary | ICD-10-CM | POA: Diagnosis not present

## 2018-01-11 DIAGNOSIS — R188 Other ascites: Secondary | ICD-10-CM | POA: Diagnosis not present

## 2018-01-11 DIAGNOSIS — Q2730 Arteriovenous malformation, site unspecified: Secondary | ICD-10-CM | POA: Diagnosis not present

## 2018-01-11 DIAGNOSIS — I5030 Unspecified diastolic (congestive) heart failure: Secondary | ICD-10-CM | POA: Diagnosis not present

## 2018-01-11 DIAGNOSIS — N184 Chronic kidney disease, stage 4 (severe): Secondary | ICD-10-CM | POA: Diagnosis not present

## 2018-01-15 DIAGNOSIS — I5030 Unspecified diastolic (congestive) heart failure: Secondary | ICD-10-CM | POA: Diagnosis not present

## 2018-01-15 DIAGNOSIS — K7460 Unspecified cirrhosis of liver: Secondary | ICD-10-CM | POA: Diagnosis not present

## 2018-01-15 DIAGNOSIS — N184 Chronic kidney disease, stage 4 (severe): Secondary | ICD-10-CM | POA: Diagnosis not present

## 2018-01-15 DIAGNOSIS — K922 Gastrointestinal hemorrhage, unspecified: Secondary | ICD-10-CM | POA: Diagnosis not present

## 2018-01-15 DIAGNOSIS — R188 Other ascites: Secondary | ICD-10-CM | POA: Diagnosis not present

## 2018-01-15 DIAGNOSIS — Q2730 Arteriovenous malformation, site unspecified: Secondary | ICD-10-CM | POA: Diagnosis not present

## 2018-01-15 LAB — CUP PACEART REMOTE DEVICE CHECK
Battery Voltage: 3.01 V
Date Time Interrogation Session: 20190610060030
Implantable Lead Location: 753860
Implantable Pulse Generator Implant Date: 20150831
Lead Channel Pacing Threshold Pulse Width: 0.4 ms
Lead Channel Setting Pacing Pulse Width: 0.4 ms
Lead Channel Setting Sensing Sensitivity: 6 mV
MDC IDC LEAD IMPLANT DT: 20150831
MDC IDC MSMT BATTERY REMAINING LONGEVITY: 124 mo
MDC IDC MSMT BATTERY REMAINING PERCENTAGE: 95.5 %
MDC IDC MSMT LEADCHNL RV IMPEDANCE VALUE: 680 Ohm
MDC IDC MSMT LEADCHNL RV PACING THRESHOLD AMPLITUDE: 0.5 V
MDC IDC MSMT LEADCHNL RV SENSING INTR AMPL: 12 mV
MDC IDC SET LEADCHNL RV PACING AMPLITUDE: 2.5 V
MDC IDC STAT BRADY RV PERCENT PACED: 99 %
Pulse Gen Serial Number: 3014543

## 2018-01-16 ENCOUNTER — Other Ambulatory Visit: Payer: Medicare Other

## 2018-01-16 DIAGNOSIS — N184 Chronic kidney disease, stage 4 (severe): Secondary | ICD-10-CM | POA: Diagnosis not present

## 2018-01-16 DIAGNOSIS — K7460 Unspecified cirrhosis of liver: Secondary | ICD-10-CM | POA: Diagnosis not present

## 2018-01-16 DIAGNOSIS — K922 Gastrointestinal hemorrhage, unspecified: Secondary | ICD-10-CM | POA: Diagnosis not present

## 2018-01-16 DIAGNOSIS — I5030 Unspecified diastolic (congestive) heart failure: Secondary | ICD-10-CM | POA: Diagnosis not present

## 2018-01-16 DIAGNOSIS — Q2730 Arteriovenous malformation, site unspecified: Secondary | ICD-10-CM | POA: Diagnosis not present

## 2018-01-16 DIAGNOSIS — R188 Other ascites: Secondary | ICD-10-CM | POA: Diagnosis not present

## 2018-01-18 DIAGNOSIS — K922 Gastrointestinal hemorrhage, unspecified: Secondary | ICD-10-CM | POA: Diagnosis not present

## 2018-01-18 DIAGNOSIS — K7460 Unspecified cirrhosis of liver: Secondary | ICD-10-CM | POA: Diagnosis not present

## 2018-01-18 DIAGNOSIS — R188 Other ascites: Secondary | ICD-10-CM | POA: Diagnosis not present

## 2018-01-18 DIAGNOSIS — Q2730 Arteriovenous malformation, site unspecified: Secondary | ICD-10-CM | POA: Diagnosis not present

## 2018-01-18 DIAGNOSIS — N184 Chronic kidney disease, stage 4 (severe): Secondary | ICD-10-CM | POA: Diagnosis not present

## 2018-01-18 DIAGNOSIS — I5030 Unspecified diastolic (congestive) heart failure: Secondary | ICD-10-CM | POA: Diagnosis not present

## 2018-01-22 DIAGNOSIS — E785 Hyperlipidemia, unspecified: Secondary | ICD-10-CM | POA: Diagnosis not present

## 2018-01-22 DIAGNOSIS — N401 Enlarged prostate with lower urinary tract symptoms: Secondary | ICD-10-CM | POA: Diagnosis not present

## 2018-01-22 DIAGNOSIS — I10 Essential (primary) hypertension: Secondary | ICD-10-CM | POA: Diagnosis not present

## 2018-01-22 DIAGNOSIS — K219 Gastro-esophageal reflux disease without esophagitis: Secondary | ICD-10-CM | POA: Diagnosis not present

## 2018-01-22 DIAGNOSIS — R188 Other ascites: Secondary | ICD-10-CM | POA: Diagnosis not present

## 2018-01-22 DIAGNOSIS — N184 Chronic kidney disease, stage 4 (severe): Secondary | ICD-10-CM | POA: Diagnosis not present

## 2018-01-22 DIAGNOSIS — M109 Gout, unspecified: Secondary | ICD-10-CM | POA: Diagnosis not present

## 2018-01-22 DIAGNOSIS — K922 Gastrointestinal hemorrhage, unspecified: Secondary | ICD-10-CM | POA: Diagnosis not present

## 2018-01-22 DIAGNOSIS — I2510 Atherosclerotic heart disease of native coronary artery without angina pectoris: Secondary | ICD-10-CM | POA: Diagnosis not present

## 2018-01-22 DIAGNOSIS — G4733 Obstructive sleep apnea (adult) (pediatric): Secondary | ICD-10-CM | POA: Diagnosis not present

## 2018-01-22 DIAGNOSIS — M1990 Unspecified osteoarthritis, unspecified site: Secondary | ICD-10-CM | POA: Diagnosis not present

## 2018-01-22 DIAGNOSIS — Q2730 Arteriovenous malformation, site unspecified: Secondary | ICD-10-CM | POA: Diagnosis not present

## 2018-01-22 DIAGNOSIS — I5030 Unspecified diastolic (congestive) heart failure: Secondary | ICD-10-CM | POA: Diagnosis not present

## 2018-01-22 DIAGNOSIS — K7460 Unspecified cirrhosis of liver: Secondary | ICD-10-CM | POA: Diagnosis not present

## 2018-01-22 DIAGNOSIS — J449 Chronic obstructive pulmonary disease, unspecified: Secondary | ICD-10-CM | POA: Diagnosis not present

## 2018-01-22 DIAGNOSIS — I4891 Unspecified atrial fibrillation: Secondary | ICD-10-CM | POA: Diagnosis not present

## 2018-01-22 DIAGNOSIS — D6959 Other secondary thrombocytopenia: Secondary | ICD-10-CM | POA: Diagnosis not present

## 2018-01-22 DIAGNOSIS — F339 Major depressive disorder, recurrent, unspecified: Secondary | ICD-10-CM | POA: Diagnosis not present

## 2018-01-22 DIAGNOSIS — E119 Type 2 diabetes mellitus without complications: Secondary | ICD-10-CM | POA: Diagnosis not present

## 2018-01-23 ENCOUNTER — Other Ambulatory Visit: Payer: Medicare Other

## 2018-01-23 DIAGNOSIS — K7460 Unspecified cirrhosis of liver: Secondary | ICD-10-CM | POA: Diagnosis not present

## 2018-01-23 DIAGNOSIS — R188 Other ascites: Secondary | ICD-10-CM | POA: Diagnosis not present

## 2018-01-23 DIAGNOSIS — I5030 Unspecified diastolic (congestive) heart failure: Secondary | ICD-10-CM | POA: Diagnosis not present

## 2018-01-23 DIAGNOSIS — Q2730 Arteriovenous malformation, site unspecified: Secondary | ICD-10-CM | POA: Diagnosis not present

## 2018-01-23 DIAGNOSIS — N184 Chronic kidney disease, stage 4 (severe): Secondary | ICD-10-CM | POA: Diagnosis not present

## 2018-01-23 DIAGNOSIS — K922 Gastrointestinal hemorrhage, unspecified: Secondary | ICD-10-CM | POA: Diagnosis not present

## 2018-01-26 DIAGNOSIS — N184 Chronic kidney disease, stage 4 (severe): Secondary | ICD-10-CM | POA: Diagnosis not present

## 2018-01-26 DIAGNOSIS — I5030 Unspecified diastolic (congestive) heart failure: Secondary | ICD-10-CM | POA: Diagnosis not present

## 2018-01-26 DIAGNOSIS — R188 Other ascites: Secondary | ICD-10-CM | POA: Diagnosis not present

## 2018-01-26 DIAGNOSIS — K7460 Unspecified cirrhosis of liver: Secondary | ICD-10-CM | POA: Diagnosis not present

## 2018-01-26 DIAGNOSIS — K922 Gastrointestinal hemorrhage, unspecified: Secondary | ICD-10-CM | POA: Diagnosis not present

## 2018-01-26 DIAGNOSIS — Q2730 Arteriovenous malformation, site unspecified: Secondary | ICD-10-CM | POA: Diagnosis not present

## 2018-01-30 ENCOUNTER — Other Ambulatory Visit: Payer: Medicare Other

## 2018-01-30 DIAGNOSIS — Q2730 Arteriovenous malformation, site unspecified: Secondary | ICD-10-CM | POA: Diagnosis not present

## 2018-01-30 DIAGNOSIS — R188 Other ascites: Secondary | ICD-10-CM | POA: Diagnosis not present

## 2018-01-30 DIAGNOSIS — K7460 Unspecified cirrhosis of liver: Secondary | ICD-10-CM | POA: Diagnosis not present

## 2018-01-30 DIAGNOSIS — K922 Gastrointestinal hemorrhage, unspecified: Secondary | ICD-10-CM | POA: Diagnosis not present

## 2018-01-30 DIAGNOSIS — N184 Chronic kidney disease, stage 4 (severe): Secondary | ICD-10-CM | POA: Diagnosis not present

## 2018-01-30 DIAGNOSIS — I5030 Unspecified diastolic (congestive) heart failure: Secondary | ICD-10-CM | POA: Diagnosis not present

## 2018-02-01 DIAGNOSIS — I5030 Unspecified diastolic (congestive) heart failure: Secondary | ICD-10-CM | POA: Diagnosis not present

## 2018-02-01 DIAGNOSIS — K922 Gastrointestinal hemorrhage, unspecified: Secondary | ICD-10-CM | POA: Diagnosis not present

## 2018-02-01 DIAGNOSIS — K7460 Unspecified cirrhosis of liver: Secondary | ICD-10-CM | POA: Diagnosis not present

## 2018-02-01 DIAGNOSIS — R188 Other ascites: Secondary | ICD-10-CM | POA: Diagnosis not present

## 2018-02-01 DIAGNOSIS — N184 Chronic kidney disease, stage 4 (severe): Secondary | ICD-10-CM | POA: Diagnosis not present

## 2018-02-01 DIAGNOSIS — Q2730 Arteriovenous malformation, site unspecified: Secondary | ICD-10-CM | POA: Diagnosis not present

## 2018-02-02 DIAGNOSIS — K7460 Unspecified cirrhosis of liver: Secondary | ICD-10-CM | POA: Diagnosis not present

## 2018-02-02 DIAGNOSIS — Q2730 Arteriovenous malformation, site unspecified: Secondary | ICD-10-CM | POA: Diagnosis not present

## 2018-02-02 DIAGNOSIS — R188 Other ascites: Secondary | ICD-10-CM | POA: Diagnosis not present

## 2018-02-02 DIAGNOSIS — I5030 Unspecified diastolic (congestive) heart failure: Secondary | ICD-10-CM | POA: Diagnosis not present

## 2018-02-02 DIAGNOSIS — K922 Gastrointestinal hemorrhage, unspecified: Secondary | ICD-10-CM | POA: Diagnosis not present

## 2018-02-02 DIAGNOSIS — N184 Chronic kidney disease, stage 4 (severe): Secondary | ICD-10-CM | POA: Diagnosis not present

## 2018-02-06 ENCOUNTER — Other Ambulatory Visit: Payer: Medicare Other

## 2018-02-06 DIAGNOSIS — I5030 Unspecified diastolic (congestive) heart failure: Secondary | ICD-10-CM | POA: Diagnosis not present

## 2018-02-06 DIAGNOSIS — Q2730 Arteriovenous malformation, site unspecified: Secondary | ICD-10-CM | POA: Diagnosis not present

## 2018-02-06 DIAGNOSIS — N184 Chronic kidney disease, stage 4 (severe): Secondary | ICD-10-CM | POA: Diagnosis not present

## 2018-02-06 DIAGNOSIS — K7460 Unspecified cirrhosis of liver: Secondary | ICD-10-CM | POA: Diagnosis not present

## 2018-02-06 DIAGNOSIS — K922 Gastrointestinal hemorrhage, unspecified: Secondary | ICD-10-CM | POA: Diagnosis not present

## 2018-02-06 DIAGNOSIS — R188 Other ascites: Secondary | ICD-10-CM | POA: Diagnosis not present

## 2018-02-07 DIAGNOSIS — R188 Other ascites: Secondary | ICD-10-CM | POA: Diagnosis not present

## 2018-02-07 DIAGNOSIS — Q2730 Arteriovenous malformation, site unspecified: Secondary | ICD-10-CM | POA: Diagnosis not present

## 2018-02-07 DIAGNOSIS — N184 Chronic kidney disease, stage 4 (severe): Secondary | ICD-10-CM | POA: Diagnosis not present

## 2018-02-07 DIAGNOSIS — K7460 Unspecified cirrhosis of liver: Secondary | ICD-10-CM | POA: Diagnosis not present

## 2018-02-07 DIAGNOSIS — I5030 Unspecified diastolic (congestive) heart failure: Secondary | ICD-10-CM | POA: Diagnosis not present

## 2018-02-07 DIAGNOSIS — K922 Gastrointestinal hemorrhage, unspecified: Secondary | ICD-10-CM | POA: Diagnosis not present

## 2018-02-09 DIAGNOSIS — Q2730 Arteriovenous malformation, site unspecified: Secondary | ICD-10-CM | POA: Diagnosis not present

## 2018-02-09 DIAGNOSIS — I5030 Unspecified diastolic (congestive) heart failure: Secondary | ICD-10-CM | POA: Diagnosis not present

## 2018-02-09 DIAGNOSIS — K922 Gastrointestinal hemorrhage, unspecified: Secondary | ICD-10-CM | POA: Diagnosis not present

## 2018-02-09 DIAGNOSIS — R188 Other ascites: Secondary | ICD-10-CM | POA: Diagnosis not present

## 2018-02-09 DIAGNOSIS — N184 Chronic kidney disease, stage 4 (severe): Secondary | ICD-10-CM | POA: Diagnosis not present

## 2018-02-09 DIAGNOSIS — K7460 Unspecified cirrhosis of liver: Secondary | ICD-10-CM | POA: Diagnosis not present

## 2018-02-12 DIAGNOSIS — R188 Other ascites: Secondary | ICD-10-CM | POA: Diagnosis not present

## 2018-02-12 DIAGNOSIS — K7460 Unspecified cirrhosis of liver: Secondary | ICD-10-CM | POA: Diagnosis not present

## 2018-02-12 DIAGNOSIS — I5030 Unspecified diastolic (congestive) heart failure: Secondary | ICD-10-CM | POA: Diagnosis not present

## 2018-02-12 DIAGNOSIS — K922 Gastrointestinal hemorrhage, unspecified: Secondary | ICD-10-CM | POA: Diagnosis not present

## 2018-02-12 DIAGNOSIS — N184 Chronic kidney disease, stage 4 (severe): Secondary | ICD-10-CM | POA: Diagnosis not present

## 2018-02-12 DIAGNOSIS — Q2730 Arteriovenous malformation, site unspecified: Secondary | ICD-10-CM | POA: Diagnosis not present

## 2018-02-13 ENCOUNTER — Other Ambulatory Visit: Payer: Medicare Other

## 2018-02-13 DIAGNOSIS — N184 Chronic kidney disease, stage 4 (severe): Secondary | ICD-10-CM | POA: Diagnosis not present

## 2018-02-13 DIAGNOSIS — I5030 Unspecified diastolic (congestive) heart failure: Secondary | ICD-10-CM | POA: Diagnosis not present

## 2018-02-13 DIAGNOSIS — K922 Gastrointestinal hemorrhage, unspecified: Secondary | ICD-10-CM | POA: Diagnosis not present

## 2018-02-13 DIAGNOSIS — Q2730 Arteriovenous malformation, site unspecified: Secondary | ICD-10-CM | POA: Diagnosis not present

## 2018-02-13 DIAGNOSIS — K7460 Unspecified cirrhosis of liver: Secondary | ICD-10-CM | POA: Diagnosis not present

## 2018-02-13 DIAGNOSIS — R188 Other ascites: Secondary | ICD-10-CM | POA: Diagnosis not present

## 2018-02-15 DIAGNOSIS — Q2730 Arteriovenous malformation, site unspecified: Secondary | ICD-10-CM | POA: Diagnosis not present

## 2018-02-15 DIAGNOSIS — K922 Gastrointestinal hemorrhage, unspecified: Secondary | ICD-10-CM | POA: Diagnosis not present

## 2018-02-15 DIAGNOSIS — K7460 Unspecified cirrhosis of liver: Secondary | ICD-10-CM | POA: Diagnosis not present

## 2018-02-15 DIAGNOSIS — R188 Other ascites: Secondary | ICD-10-CM | POA: Diagnosis not present

## 2018-02-15 DIAGNOSIS — N184 Chronic kidney disease, stage 4 (severe): Secondary | ICD-10-CM | POA: Diagnosis not present

## 2018-02-15 DIAGNOSIS — I5030 Unspecified diastolic (congestive) heart failure: Secondary | ICD-10-CM | POA: Diagnosis not present

## 2018-02-16 DIAGNOSIS — Q2730 Arteriovenous malformation, site unspecified: Secondary | ICD-10-CM | POA: Diagnosis not present

## 2018-02-16 DIAGNOSIS — K922 Gastrointestinal hemorrhage, unspecified: Secondary | ICD-10-CM | POA: Diagnosis not present

## 2018-02-16 DIAGNOSIS — R188 Other ascites: Secondary | ICD-10-CM | POA: Diagnosis not present

## 2018-02-16 DIAGNOSIS — K7460 Unspecified cirrhosis of liver: Secondary | ICD-10-CM | POA: Diagnosis not present

## 2018-02-16 DIAGNOSIS — I5030 Unspecified diastolic (congestive) heart failure: Secondary | ICD-10-CM | POA: Diagnosis not present

## 2018-02-16 DIAGNOSIS — N184 Chronic kidney disease, stage 4 (severe): Secondary | ICD-10-CM | POA: Diagnosis not present

## 2018-02-18 DIAGNOSIS — K7460 Unspecified cirrhosis of liver: Secondary | ICD-10-CM | POA: Diagnosis not present

## 2018-02-18 DIAGNOSIS — N184 Chronic kidney disease, stage 4 (severe): Secondary | ICD-10-CM | POA: Diagnosis not present

## 2018-02-18 DIAGNOSIS — Q2730 Arteriovenous malformation, site unspecified: Secondary | ICD-10-CM | POA: Diagnosis not present

## 2018-02-18 DIAGNOSIS — K922 Gastrointestinal hemorrhage, unspecified: Secondary | ICD-10-CM | POA: Diagnosis not present

## 2018-02-18 DIAGNOSIS — I5030 Unspecified diastolic (congestive) heart failure: Secondary | ICD-10-CM | POA: Diagnosis not present

## 2018-02-18 DIAGNOSIS — R188 Other ascites: Secondary | ICD-10-CM | POA: Diagnosis not present

## 2018-02-19 DIAGNOSIS — R188 Other ascites: Secondary | ICD-10-CM | POA: Diagnosis not present

## 2018-02-19 DIAGNOSIS — K922 Gastrointestinal hemorrhage, unspecified: Secondary | ICD-10-CM | POA: Diagnosis not present

## 2018-02-19 DIAGNOSIS — I5030 Unspecified diastolic (congestive) heart failure: Secondary | ICD-10-CM | POA: Diagnosis not present

## 2018-02-19 DIAGNOSIS — N184 Chronic kidney disease, stage 4 (severe): Secondary | ICD-10-CM | POA: Diagnosis not present

## 2018-02-19 DIAGNOSIS — K7460 Unspecified cirrhosis of liver: Secondary | ICD-10-CM | POA: Diagnosis not present

## 2018-02-19 DIAGNOSIS — Q2730 Arteriovenous malformation, site unspecified: Secondary | ICD-10-CM | POA: Diagnosis not present

## 2018-02-20 ENCOUNTER — Other Ambulatory Visit: Payer: Medicare Other

## 2018-02-20 ENCOUNTER — Ambulatory Visit: Payer: Medicare Other

## 2018-02-20 ENCOUNTER — Ambulatory Visit: Payer: Medicare Other | Admitting: Oncology

## 2018-02-20 ENCOUNTER — Ambulatory Visit: Payer: Medicare Other | Admitting: Hematology and Oncology

## 2018-02-20 DIAGNOSIS — Q2730 Arteriovenous malformation, site unspecified: Secondary | ICD-10-CM | POA: Diagnosis not present

## 2018-02-20 DIAGNOSIS — I5030 Unspecified diastolic (congestive) heart failure: Secondary | ICD-10-CM | POA: Diagnosis not present

## 2018-02-20 DIAGNOSIS — R188 Other ascites: Secondary | ICD-10-CM | POA: Diagnosis not present

## 2018-02-20 DIAGNOSIS — K922 Gastrointestinal hemorrhage, unspecified: Secondary | ICD-10-CM | POA: Diagnosis not present

## 2018-02-20 DIAGNOSIS — N184 Chronic kidney disease, stage 4 (severe): Secondary | ICD-10-CM | POA: Diagnosis not present

## 2018-02-20 DIAGNOSIS — K7460 Unspecified cirrhosis of liver: Secondary | ICD-10-CM | POA: Diagnosis not present

## 2018-02-21 DIAGNOSIS — Q2730 Arteriovenous malformation, site unspecified: Secondary | ICD-10-CM | POA: Diagnosis not present

## 2018-02-21 DIAGNOSIS — R188 Other ascites: Secondary | ICD-10-CM | POA: Diagnosis not present

## 2018-02-21 DIAGNOSIS — K922 Gastrointestinal hemorrhage, unspecified: Secondary | ICD-10-CM | POA: Diagnosis not present

## 2018-02-21 DIAGNOSIS — I5030 Unspecified diastolic (congestive) heart failure: Secondary | ICD-10-CM | POA: Diagnosis not present

## 2018-02-21 DIAGNOSIS — K7460 Unspecified cirrhosis of liver: Secondary | ICD-10-CM | POA: Diagnosis not present

## 2018-02-21 DIAGNOSIS — N184 Chronic kidney disease, stage 4 (severe): Secondary | ICD-10-CM | POA: Diagnosis not present

## 2018-02-22 DIAGNOSIS — G4733 Obstructive sleep apnea (adult) (pediatric): Secondary | ICD-10-CM | POA: Diagnosis not present

## 2018-02-22 DIAGNOSIS — E119 Type 2 diabetes mellitus without complications: Secondary | ICD-10-CM | POA: Diagnosis not present

## 2018-02-22 DIAGNOSIS — K922 Gastrointestinal hemorrhage, unspecified: Secondary | ICD-10-CM | POA: Diagnosis not present

## 2018-02-22 DIAGNOSIS — I4891 Unspecified atrial fibrillation: Secondary | ICD-10-CM | POA: Diagnosis not present

## 2018-02-22 DIAGNOSIS — N401 Enlarged prostate with lower urinary tract symptoms: Secondary | ICD-10-CM | POA: Diagnosis not present

## 2018-02-22 DIAGNOSIS — R188 Other ascites: Secondary | ICD-10-CM | POA: Diagnosis not present

## 2018-02-22 DIAGNOSIS — K219 Gastro-esophageal reflux disease without esophagitis: Secondary | ICD-10-CM | POA: Diagnosis not present

## 2018-02-22 DIAGNOSIS — M109 Gout, unspecified: Secondary | ICD-10-CM | POA: Diagnosis not present

## 2018-02-22 DIAGNOSIS — M1990 Unspecified osteoarthritis, unspecified site: Secondary | ICD-10-CM | POA: Diagnosis not present

## 2018-02-22 DIAGNOSIS — Q2730 Arteriovenous malformation, site unspecified: Secondary | ICD-10-CM | POA: Diagnosis not present

## 2018-02-22 DIAGNOSIS — F339 Major depressive disorder, recurrent, unspecified: Secondary | ICD-10-CM | POA: Diagnosis not present

## 2018-02-22 DIAGNOSIS — I10 Essential (primary) hypertension: Secondary | ICD-10-CM | POA: Diagnosis not present

## 2018-02-22 DIAGNOSIS — I5030 Unspecified diastolic (congestive) heart failure: Secondary | ICD-10-CM | POA: Diagnosis not present

## 2018-02-22 DIAGNOSIS — D6959 Other secondary thrombocytopenia: Secondary | ICD-10-CM | POA: Diagnosis not present

## 2018-02-22 DIAGNOSIS — I2510 Atherosclerotic heart disease of native coronary artery without angina pectoris: Secondary | ICD-10-CM | POA: Diagnosis not present

## 2018-02-22 DIAGNOSIS — J449 Chronic obstructive pulmonary disease, unspecified: Secondary | ICD-10-CM | POA: Diagnosis not present

## 2018-02-22 DIAGNOSIS — E785 Hyperlipidemia, unspecified: Secondary | ICD-10-CM | POA: Diagnosis not present

## 2018-02-22 DIAGNOSIS — K7460 Unspecified cirrhosis of liver: Secondary | ICD-10-CM | POA: Diagnosis not present

## 2018-02-22 DIAGNOSIS — N184 Chronic kidney disease, stage 4 (severe): Secondary | ICD-10-CM | POA: Diagnosis not present

## 2018-03-05 DIAGNOSIS — R0902 Hypoxemia: Secondary | ICD-10-CM | POA: Diagnosis not present

## 2018-03-05 DIAGNOSIS — E1165 Type 2 diabetes mellitus with hyperglycemia: Secondary | ICD-10-CM | POA: Diagnosis not present

## 2018-03-05 DIAGNOSIS — R0689 Other abnormalities of breathing: Secondary | ICD-10-CM | POA: Diagnosis not present

## 2018-03-05 DIAGNOSIS — N186 End stage renal disease: Secondary | ICD-10-CM | POA: Diagnosis not present

## 2018-03-05 DIAGNOSIS — E8770 Fluid overload, unspecified: Secondary | ICD-10-CM | POA: Diagnosis not present

## 2018-03-05 DIAGNOSIS — R457 State of emotional shock and stress, unspecified: Secondary | ICD-10-CM | POA: Diagnosis not present

## 2018-03-05 DIAGNOSIS — E1122 Type 2 diabetes mellitus with diabetic chronic kidney disease: Secondary | ICD-10-CM | POA: Diagnosis not present

## 2018-03-05 DIAGNOSIS — D631 Anemia in chronic kidney disease: Secondary | ICD-10-CM | POA: Diagnosis not present

## 2018-03-05 DIAGNOSIS — Z992 Dependence on renal dialysis: Secondary | ICD-10-CM | POA: Diagnosis not present

## 2018-03-05 DIAGNOSIS — I120 Hypertensive chronic kidney disease with stage 5 chronic kidney disease or end stage renal disease: Secondary | ICD-10-CM | POA: Diagnosis not present

## 2018-03-05 DIAGNOSIS — R000 Tachycardia, unspecified: Secondary | ICD-10-CM | POA: Diagnosis not present

## 2018-03-05 DIAGNOSIS — N2581 Secondary hyperparathyroidism of renal origin: Secondary | ICD-10-CM | POA: Diagnosis not present

## 2018-03-06 DIAGNOSIS — E1122 Type 2 diabetes mellitus with diabetic chronic kidney disease: Secondary | ICD-10-CM | POA: Diagnosis not present

## 2018-03-06 DIAGNOSIS — D631 Anemia in chronic kidney disease: Secondary | ICD-10-CM | POA: Diagnosis not present

## 2018-03-06 DIAGNOSIS — N2581 Secondary hyperparathyroidism of renal origin: Secondary | ICD-10-CM | POA: Diagnosis not present

## 2018-03-06 DIAGNOSIS — E8770 Fluid overload, unspecified: Secondary | ICD-10-CM | POA: Diagnosis not present

## 2018-03-06 DIAGNOSIS — N186 End stage renal disease: Secondary | ICD-10-CM | POA: Diagnosis not present

## 2018-03-06 DIAGNOSIS — I120 Hypertensive chronic kidney disease with stage 5 chronic kidney disease or end stage renal disease: Secondary | ICD-10-CM | POA: Diagnosis not present

## 2018-03-06 DIAGNOSIS — Z992 Dependence on renal dialysis: Secondary | ICD-10-CM | POA: Diagnosis not present

## 2018-03-07 ENCOUNTER — Encounter (HOSPITAL_COMMUNITY): Payer: Self-pay | Admitting: *Deleted

## 2018-03-07 ENCOUNTER — Other Ambulatory Visit: Payer: Self-pay

## 2018-03-07 ENCOUNTER — Emergency Department (HOSPITAL_COMMUNITY)
Admission: EM | Admit: 2018-03-07 | Discharge: 2018-03-07 | Disposition: A | Discharge status: Home / Self Care | Attending: Emergency Medicine | Admitting: Emergency Medicine

## 2018-03-07 ENCOUNTER — Emergency Department (HOSPITAL_COMMUNITY)

## 2018-03-07 ENCOUNTER — Encounter (HOSPITAL_COMMUNITY): Payer: Self-pay | Admitting: Interventional Radiology

## 2018-03-07 DIAGNOSIS — S79912A Unspecified injury of left hip, initial encounter: Secondary | ICD-10-CM | POA: Diagnosis not present

## 2018-03-07 DIAGNOSIS — I959 Hypotension, unspecified: Secondary | ICD-10-CM | POA: Diagnosis not present

## 2018-03-07 DIAGNOSIS — W19XXXA Unspecified fall, initial encounter: Secondary | ICD-10-CM

## 2018-03-07 DIAGNOSIS — S4990XA Unspecified injury of shoulder and upper arm, unspecified arm, initial encounter: Secondary | ICD-10-CM | POA: Diagnosis not present

## 2018-03-07 DIAGNOSIS — Y9301 Activity, walking, marching and hiking: Secondary | ICD-10-CM | POA: Diagnosis not present

## 2018-03-07 DIAGNOSIS — Y999 Unspecified external cause status: Secondary | ICD-10-CM | POA: Insufficient documentation

## 2018-03-07 DIAGNOSIS — S8991XA Unspecified injury of right lower leg, initial encounter: Secondary | ICD-10-CM | POA: Diagnosis not present

## 2018-03-07 DIAGNOSIS — W0110XA Fall on same level from slipping, tripping and stumbling with subsequent striking against unspecified object, initial encounter: Secondary | ICD-10-CM | POA: Diagnosis not present

## 2018-03-07 DIAGNOSIS — S0993XA Unspecified injury of face, initial encounter: Secondary | ICD-10-CM | POA: Diagnosis not present

## 2018-03-07 DIAGNOSIS — S0990XA Unspecified injury of head, initial encounter: Secondary | ICD-10-CM | POA: Diagnosis not present

## 2018-03-07 DIAGNOSIS — Y92002 Bathroom of unspecified non-institutional (private) residence single-family (private) house as the place of occurrence of the external cause: Secondary | ICD-10-CM | POA: Insufficient documentation

## 2018-03-07 DIAGNOSIS — D631 Anemia in chronic kidney disease: Secondary | ICD-10-CM | POA: Diagnosis not present

## 2018-03-07 DIAGNOSIS — S161XXA Strain of muscle, fascia and tendon at neck level, initial encounter: Secondary | ICD-10-CM | POA: Insufficient documentation

## 2018-03-07 DIAGNOSIS — S59901A Unspecified injury of right elbow, initial encounter: Secondary | ICD-10-CM | POA: Diagnosis not present

## 2018-03-07 DIAGNOSIS — E1122 Type 2 diabetes mellitus with diabetic chronic kidney disease: Secondary | ICD-10-CM | POA: Diagnosis not present

## 2018-03-07 DIAGNOSIS — N2581 Secondary hyperparathyroidism of renal origin: Secondary | ICD-10-CM | POA: Diagnosis not present

## 2018-03-07 DIAGNOSIS — R040 Epistaxis: Secondary | ICD-10-CM | POA: Diagnosis not present

## 2018-03-07 DIAGNOSIS — E8770 Fluid overload, unspecified: Secondary | ICD-10-CM | POA: Diagnosis not present

## 2018-03-07 DIAGNOSIS — S199XXA Unspecified injury of neck, initial encounter: Secondary | ICD-10-CM | POA: Diagnosis not present

## 2018-03-07 DIAGNOSIS — S79911A Unspecified injury of right hip, initial encounter: Secondary | ICD-10-CM | POA: Diagnosis not present

## 2018-03-07 DIAGNOSIS — Z992 Dependence on renal dialysis: Secondary | ICD-10-CM | POA: Diagnosis not present

## 2018-03-07 DIAGNOSIS — N186 End stage renal disease: Secondary | ICD-10-CM | POA: Diagnosis not present

## 2018-03-07 DIAGNOSIS — S022XXA Fracture of nasal bones, initial encounter for closed fracture: Secondary | ICD-10-CM | POA: Insufficient documentation

## 2018-03-07 DIAGNOSIS — I120 Hypertensive chronic kidney disease with stage 5 chronic kidney disease or end stage renal disease: Secondary | ICD-10-CM | POA: Diagnosis not present

## 2018-03-07 HISTORY — DX: Atherosclerotic heart disease of native coronary artery without angina pectoris: I25.10

## 2018-03-07 HISTORY — DX: Angiodysplasia of colon without hemorrhage: K55.20

## 2018-03-07 HISTORY — DX: Heart failure, unspecified: I50.9

## 2018-03-07 HISTORY — DX: Type 2 diabetes mellitus without complications: E11.9

## 2018-03-07 LAB — COMPREHENSIVE METABOLIC PANEL
ALBUMIN: 2.3 g/dL — AB (ref 3.5–5.0)
ALT: 12 U/L (ref 0–44)
AST: 24 U/L (ref 15–41)
Alkaline Phosphatase: 95 U/L (ref 38–126)
Anion gap: 17 — ABNORMAL HIGH (ref 5–15)
BUN: 70 mg/dL — AB (ref 8–23)
CHLORIDE: 85 mmol/L — AB (ref 98–111)
CO2: 28 mmol/L (ref 22–32)
CREATININE: 1.61 mg/dL — AB (ref 0.61–1.24)
Calcium: 8.6 mg/dL — ABNORMAL LOW (ref 8.9–10.3)
GFR calc Af Amer: 49 mL/min — ABNORMAL LOW (ref >60)
GFR, EST NON AFRICAN AMERICAN: 42 mL/min — AB (ref >60)
Glucose, Bld: 181 mg/dL — ABNORMAL HIGH (ref 70–99)
POTASSIUM: 2.8 mmol/L — AB (ref 3.5–5.1)
SODIUM: 130 mmol/L — AB (ref 135–145)
Total Bilirubin: 1 mg/dL (ref 0.3–1.2)
Total Protein: 6.1 g/dL — ABNORMAL LOW (ref 6.5–8.1)

## 2018-03-07 LAB — CBC
HEMATOCRIT: 21.2 % — AB (ref 39.0–52.0)
Hemoglobin: 5.8 g/dL — CL (ref 13.0–17.0)
MCH: 20.4 pg — AB (ref 26.0–34.0)
MCHC: 27.4 g/dL — ABNORMAL LOW (ref 30.0–36.0)
MCV: 74.6 fL — AB (ref 78.0–100.0)
Platelets: 194 10*3/uL (ref 150–400)
RBC: 2.84 MIL/uL — AB (ref 4.22–5.81)
RDW: 22.4 % — AB (ref 11.5–15.5)
WBC: 5.5 10*3/uL (ref 4.0–10.5)

## 2018-03-07 LAB — I-STAT CHEM 8, ED
BUN: 72 mg/dL — ABNORMAL HIGH (ref 8–23)
CREATININE: 1.6 mg/dL — AB (ref 0.61–1.24)
Calcium, Ion: 1 mmol/L — ABNORMAL LOW (ref 1.15–1.40)
Chloride: 82 mmol/L — ABNORMAL LOW (ref 98–111)
Glucose, Bld: 177 mg/dL — ABNORMAL HIGH (ref 70–99)
HCT: 21 % — ABNORMAL LOW (ref 39.0–52.0)
HEMOGLOBIN: 7.1 g/dL — AB (ref 13.0–17.0)
POTASSIUM: 2.6 mmol/L — AB (ref 3.5–5.1)
Sodium: 129 mmol/L — ABNORMAL LOW (ref 135–145)
TCO2: 32 mmol/L (ref 22–32)

## 2018-03-07 LAB — AMMONIA: Ammonia: 20 umol/L (ref 9–35)

## 2018-03-07 LAB — PROTIME-INR
INR: 8.53
Prothrombin Time: 69.9 seconds — ABNORMAL HIGH (ref 11.4–15.2)

## 2018-03-07 LAB — SAMPLE TO BLOOD BANK

## 2018-03-07 MED ORDER — MORPHINE SULFATE (PF) 2 MG/ML IV SOLN: 2.0000 mg | Freq: Once | INTRAVENOUS | Status: DC

## 2018-03-07 MED ORDER — ONDANSETRON HCL 4 MG/2ML IJ SOLN
4.0000 mg | Freq: Once | INTRAMUSCULAR | Status: AC
  Administered 2018-03-07: 4 mg via INTRAVENOUS

## 2018-03-07 MED ORDER — LORAZEPAM 2 MG/ML IJ SOLN
1.0000 mg | Freq: Once | INTRAMUSCULAR | Status: AC
  Administered 2018-03-07: 0.5 mg via INTRAVENOUS
  Filled 2018-03-07: qty 1

## 2018-03-07 MED ORDER — FENTANYL CITRATE (PF) 100 MCG/2ML IJ SOLN
50.0000 ug | Freq: Once | INTRAMUSCULAR | Status: AC
Start: 2018-03-07 — End: 2018-03-07
  Administered 2018-03-07: 50 ug via INTRAVENOUS
  Filled 2018-03-07: qty 2

## 2018-03-07 NOTE — ED Notes (Signed)
Per wife at bedside, pt has been nonambulatory "for months"  And is currently on Hospice care for cirrhosis. Tonight, pt's wife heard a thud and found pt lying on the floor in a pool of blood. Paracentesis drain noted to RLQ.

## 2018-03-07 NOTE — ED Notes (Signed)
ED Provider at bedside. 

## 2018-03-07 NOTE — ED Notes (Signed)
Pt arrived from home after a fall when trying to walk to the bathroom. Pt found lying in the bathroom with questionable LOC and possibly hitting face on the sink. Active bleeding noted to lac across nose. EMS reports ~1liter of blood on floor of home. Pt A&Ox 3 on arrival, following commands at present

## 2018-03-07 NOTE — ED Notes (Signed)
Pt taken to CT.

## 2018-03-07 NOTE — ED Notes (Signed)
Pt beginning to get agitated, pulling wired off.

## 2018-03-07 NOTE — Discharge Instructions (Addendum)
Continue medications as previously prescribed.  Follow-up with hospice care to discuss additional services.

## 2018-03-07 NOTE — ED Notes (Signed)
Writer notified EDP of abnormal I stat chem result.

## 2018-03-07 NOTE — ED Provider Notes (Signed)
[facility] EMERGENCY DEPARTMENT Provider Note   CSN: [id number] Arrival date & time: 03/07/18  0159     History   Chief Complaint Chief Complaint  Patient presents with  . Fall    HPI [name] is a 73 y.o. male.  Patient is a 72 year old male with history of coronary artery disease/CABG, cirrhosis of the liver, and chronic renal insufficiency currently on hospice care.  He was brought by EMS after a fall at home.  He got up and attempted to walk to the bathroom when he fell and ended up on the floor.  He had bleeding from his nose.  There was unknown loss of consciousness.  EMS had difficulty controlling bleeding from his nose in route.  He complains of headache, facial pain, and pain in his right elbow and knee.  The history is provided by the patient.  Fall  This is a new problem. The current episode started less than 1 hour ago. The problem has not changed since onset.Associated symptoms include headaches. Nothing aggravates the symptoms. Nothing relieves the symptoms. He has tried nothing for the symptoms. The treatment provided no relief.    No past medical history on file.  There are no active problems to display for this patient.         Home Medications    Prior to Admission medications   Not on File    Family History No family history on file.  Social History Social History   Tobacco Use  . Smoking status: Not on file  Substance Use Topics  . Alcohol use: Not on file  . Drug use: Not on file     Allergies   Patient has no known allergies.   Review of Systems Review of Systems  Neurological: Positive for headaches.  All other systems reviewed and are negative.    Physical Exam Updated Vital Signs BP (!) 100/50   Ht 5\' 8"  (1.727 m)   Wt 81.6 kg   BMI 27.37 kg/m   Physical Exam  Constitutional: He is oriented to person, place, and time. He appears well-developed and well-nourished. No distress.  HENT:  Head:  Normocephalic and atraumatic.  Mouth/Throat: Oropharynx is clear and moist.  There is a 1 cm laceration to the bridge of the nose.  There is no other obvious deformity.  There is some blood from the right nare, however this is controlled.  Septum appears midline.  Eyes: Pupils are equal, round, and reactive to light. EOM are normal.  Neck: Normal range of motion. Neck supple.  Cardiovascular: Normal rate and regular rhythm. Exam reveals no friction rub.  No murmur heard. Pulmonary/Chest: Effort normal and breath sounds normal. No respiratory distress. He has no wheezes. He has no rales.  Abdominal: Soft. Bowel sounds are normal. He exhibits no distension. There is no tenderness.  There is a paracentesis catheter present in the right upper abdomen.  Musculoskeletal: Normal range of motion. He exhibits no edema.  Neurological: He is alert and oriented to person, place, and time. No cranial nerve deficit. He exhibits normal muscle tone. Coordination normal.  Skin: Skin is warm and dry. He is not diaphoretic.  Nursing note and vitals reviewed.    ED Treatments / Results  Labs (all labs ordered are listed, but only abnormal results are displayed) Labs Reviewed - No data to display  EKG None  Radiology No results found.  Procedures Procedures (including critical care time)  Medications Ordered in ED Medications - No  data to display   Initial Impression / Assessment and Plan / ED Course  I have reviewed the triage vital signs and the nursing notes.  Pertinent labs & imaging results that were available during my care of the patient were reviewed by me and considered in my medical decision making (see chart for details).  Patient with history of coronary artery disease, end-stage liver cirrhosis, and chronic renal insufficiency presenting for evaluation of a fall.  He has not been ambulatory in several months, however this evening attempted to walk to the bathroom.  He apparently fell  and hit his head.  He had a laceration to the bridge of the nose and an abrasion below his right eye.  He also had some bleeding from the nares.  I do not feel as though suturing is indicated.  The laceration to the bridge of the nose appears well approximated and I believe will heal uneventfully.  His work-up reveals nasal bone fractures, however no other obvious abnormality.  Laboratory studies do show a potassium of 2.8 and he appears somewhat dry with BUN of 70 and creatinine of 1.6.  Hemoglobin is 5.8 which I suspect is chronic and INR is 8.5 reflective of his advanced liver failure.  I have had a long discussion with the patient's family including wife, daughter, and son who were all present at bedside.  Everyone is in agreement that they would like to abide by his wishes of hospice/comfort measures.  For this reason, I will forego interventions such as transfusion/correction of his INR.  Dressings will be applied to the skin tears on his right arm and patient will be discharged to home in the care of hospice and his family members.  Final Clinical Impressions(s) / ED Diagnoses   Final diagnoses:  None    ED Discharge Orders    None       Veryl Speak, MD 03/07/18 [phone]

## 2018-03-08 DIAGNOSIS — D631 Anemia in chronic kidney disease: Secondary | ICD-10-CM | POA: Diagnosis not present

## 2018-03-08 DIAGNOSIS — Z992 Dependence on renal dialysis: Secondary | ICD-10-CM | POA: Diagnosis not present

## 2018-03-08 DIAGNOSIS — N2581 Secondary hyperparathyroidism of renal origin: Secondary | ICD-10-CM | POA: Diagnosis not present

## 2018-03-08 DIAGNOSIS — I120 Hypertensive chronic kidney disease with stage 5 chronic kidney disease or end stage renal disease: Secondary | ICD-10-CM | POA: Diagnosis not present

## 2018-03-08 DIAGNOSIS — E1122 Type 2 diabetes mellitus with diabetic chronic kidney disease: Secondary | ICD-10-CM | POA: Diagnosis not present

## 2018-03-08 DIAGNOSIS — E8770 Fluid overload, unspecified: Secondary | ICD-10-CM | POA: Diagnosis not present

## 2018-03-08 DIAGNOSIS — N186 End stage renal disease: Secondary | ICD-10-CM | POA: Diagnosis not present

## 2018-03-25 DEATH — deceased

## 2018-04-02 ENCOUNTER — Telehealth: Payer: Self-pay

## 2018-04-02 ENCOUNTER — Encounter: Payer: Medicare Other | Admitting: *Deleted

## 2018-04-02 NOTE — Telephone Encounter (Signed)
LMOVM reminding pt to send remote transmission.   

## 2018-06-03 DIAGNOSIS — R0602 Shortness of breath: Secondary | ICD-10-CM | POA: Diagnosis not present

## 2018-06-03 DIAGNOSIS — R000 Tachycardia, unspecified: Secondary | ICD-10-CM | POA: Diagnosis not present

## 2018-06-03 DIAGNOSIS — I10 Essential (primary) hypertension: Secondary | ICD-10-CM | POA: Diagnosis not present

## 2018-06-03 DIAGNOSIS — R110 Nausea: Secondary | ICD-10-CM | POA: Diagnosis not present

## 2018-06-03 DIAGNOSIS — R0902 Hypoxemia: Secondary | ICD-10-CM | POA: Diagnosis not present

## 2019-05-23 IMAGING — NM NM GI BLOOD LOSS
2 series · 12 of 12 positions shown · non-contrast
Comparison: None

CLINICAL DATA: GI bleed.  Melena.

EXAM:
NUCLEAR MEDICINE GASTROINTESTINAL BLEEDING SCAN
TECHNIQUE: Sequential abdominal images were obtained following intravenous
administration of 7c-CCm labeled red blood cells.
RADIOPHARMACEUTICALS:  24 mCi 7c-CCm pertechnetate in-vitro labeled
red cells.

[Series 1: gi bleed · 4.14mm/px · 6 of 60 frames shown (1 of 2)]
[frame 6/60]
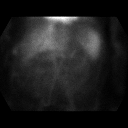
[frame 16/60]
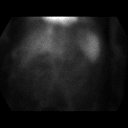
[frame 26/60]
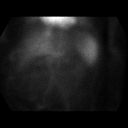
[frame 36/60]
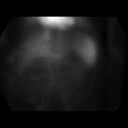
[frame 46/60]
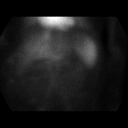
[frame 56/60]
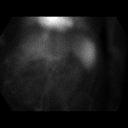

[Series 2: gi bleed · 4.14mm/px · 6 of 60 frames shown (2 of 2)]
[frame 6/60]
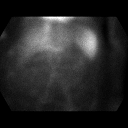
[frame 16/60]
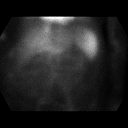
[frame 26/60]
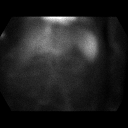
[frame 36/60]
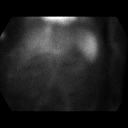
[frame 46/60]
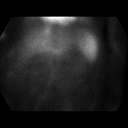
[frame 56/60]
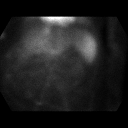

[12 of 12 positions shown; findings below may reference images not displayed]

FINDINGS: There is no evidence of active bleeding during the examination,
carried [DATE] hour.
IMPRESSION: Negative GI bleeding study.

## 2019-05-29 IMAGING — US IR PARACENTESIS
1 series · 2 of 2 positions shown · non-contrast
Comparison: none

INDICATION: History of NASH cirrhosis with recurrent abdominal ascites. Request
is made for therapeutic paracentesis.

[Series 1: ir (id) (id)/(id)/(id) ir · 2 of 2 slices shown]
[im 1/2]
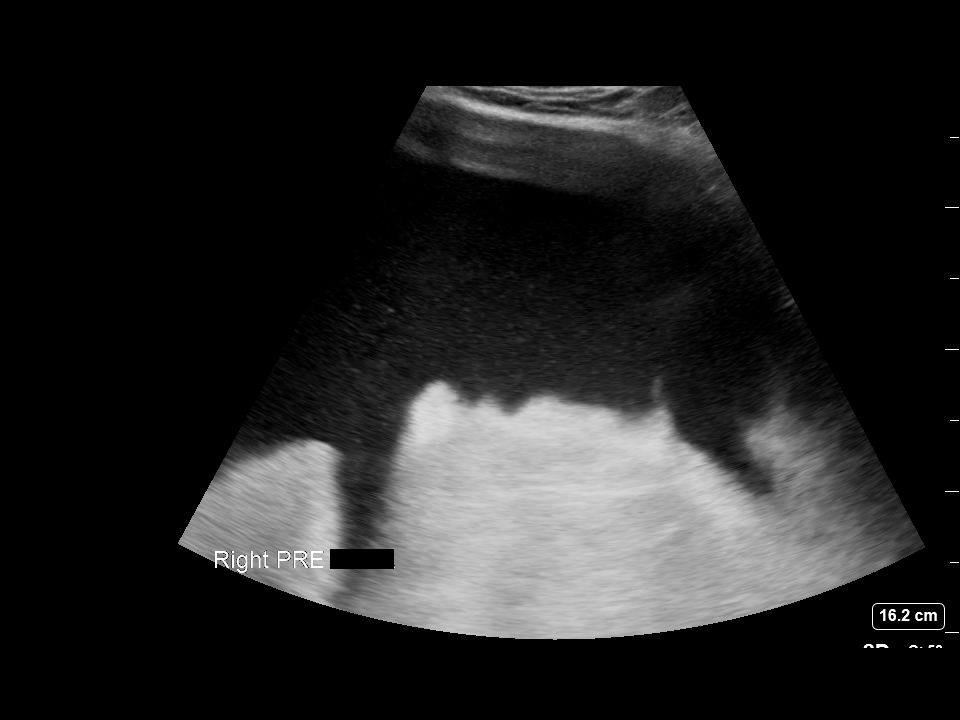
[im 2/2]
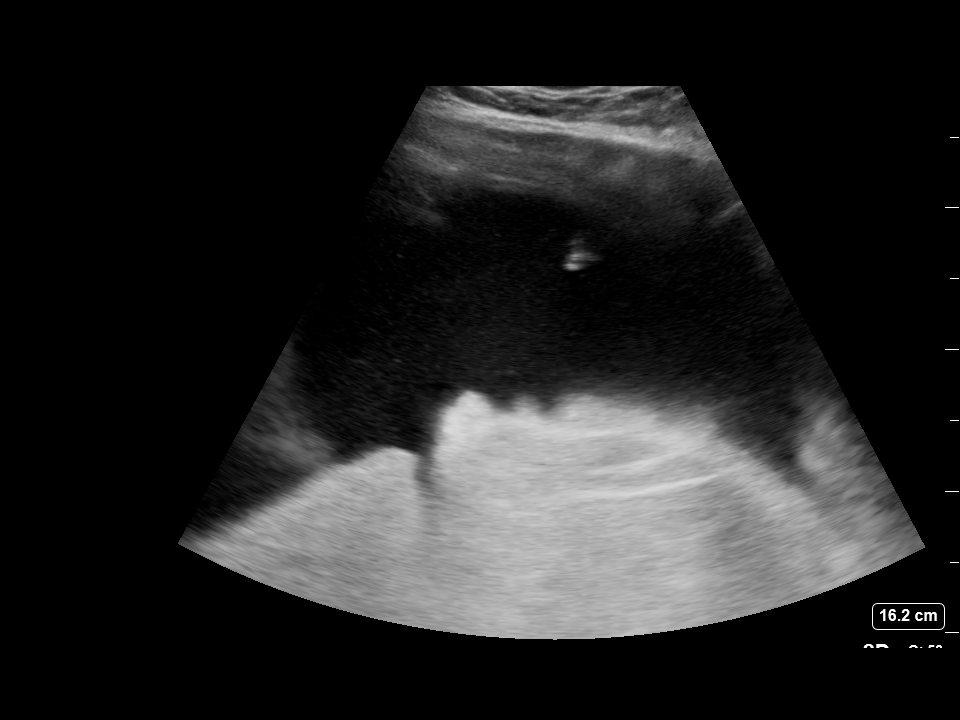

[2 of 2 positions shown; findings below may reference images not displayed]

EXAM:
ULTRASOUND GUIDED THERAPEUTIC PARACENTESIS

MEDICATIONS:
2% lidocaine

COMPLICATIONS:
None immediate.

PROCEDURE:
Informed written consent was obtained from the patient after a
discussion of the risks, benefits and alternatives to treatment. A
timeout was performed prior to the initiation of the procedure.

Initial ultrasound scanning demonstrates a large amount of ascites
within the right lower abdominal quadrant. The right lower abdomen
was prepped and draped in the usual sterile fashion. 2% lidocaine
was used for local anesthesia.

Following this, a 19 gauge, 7-cm, Yueh catheter was introduced. An
ultrasound image was saved for documentation purposes. The
paracentesis was performed. The catheter was removed and a dressing
was applied. The patient tolerated the procedure well without
immediate post procedural complication.
FINDINGS: A total of approximately 3.4 L of serous fluid was removed.
IMPRESSION: Successful ultrasound-guided paracentesis yielding 3.4 liters of
peritoneal fluid.
